# Patient Record
Sex: Female | Born: 1955 | Race: Black or African American | Hispanic: No | Smoking: Current every day smoker
Health system: Southern US, Community
[De-identification: ages and names within clinical notes are randomized; demographics above are authoritative.]

## PROBLEM LIST (undated history)

## (undated) ENCOUNTER — Emergency Department (HOSPITAL_COMMUNITY): Discharge status: Absent without leave | Payer: Medicaid Other

## (undated) DIAGNOSIS — M4800 Spinal stenosis, site unspecified: Secondary | ICD-10-CM

## (undated) DIAGNOSIS — IMO0001 Reserved for inherently not codable concepts without codable children: Secondary | ICD-10-CM

## (undated) DIAGNOSIS — J189 Pneumonia, unspecified organism: Secondary | ICD-10-CM

## (undated) DIAGNOSIS — I10 Essential (primary) hypertension: Secondary | ICD-10-CM

## (undated) DIAGNOSIS — R32 Unspecified urinary incontinence: Secondary | ICD-10-CM

## (undated) DIAGNOSIS — N183 Chronic kidney disease, stage 3 unspecified: Secondary | ICD-10-CM

## (undated) DIAGNOSIS — K5900 Constipation, unspecified: Secondary | ICD-10-CM

## (undated) DIAGNOSIS — G959 Disease of spinal cord, unspecified: Secondary | ICD-10-CM

## (undated) DIAGNOSIS — K219 Gastro-esophageal reflux disease without esophagitis: Secondary | ICD-10-CM

## (undated) DIAGNOSIS — J449 Chronic obstructive pulmonary disease, unspecified: Secondary | ICD-10-CM

## (undated) DIAGNOSIS — Y249XXA Unspecified firearm discharge, undetermined intent, initial encounter: Secondary | ICD-10-CM

## (undated) DIAGNOSIS — G9341 Metabolic encephalopathy: Secondary | ICD-10-CM

## (undated) DIAGNOSIS — Z87442 Personal history of urinary calculi: Secondary | ICD-10-CM

## (undated) DIAGNOSIS — F17200 Nicotine dependence, unspecified, uncomplicated: Secondary | ICD-10-CM

## (undated) DIAGNOSIS — W3400XA Accidental discharge from unspecified firearms or gun, initial encounter: Secondary | ICD-10-CM

## (undated) DIAGNOSIS — F1419 Cocaine abuse with unspecified cocaine-induced disorder: Secondary | ICD-10-CM

## (undated) DIAGNOSIS — G5600 Carpal tunnel syndrome, unspecified upper limb: Secondary | ICD-10-CM

## (undated) DIAGNOSIS — M069 Rheumatoid arthritis, unspecified: Secondary | ICD-10-CM

## (undated) HISTORY — PX: ABDOMINAL HYSTERECTOMY: SHX81

## (undated) HISTORY — PX: COLON SURGERY: SHX602

## (undated) HISTORY — PX: ABDOMINAL SURGERY: SHX537

---

## 1997-12-19 ENCOUNTER — Encounter: Payer: Self-pay | Admitting: Emergency Medicine

## 1997-12-19 ENCOUNTER — Emergency Department (HOSPITAL_COMMUNITY): Admission: EM | Admit: 1997-12-19 | Discharge: 1997-12-19 | Payer: Self-pay | Admitting: Emergency Medicine

## 1999-03-22 ENCOUNTER — Emergency Department (HOSPITAL_COMMUNITY): Admission: EM | Admit: 1999-03-22 | Discharge: 1999-03-23 | Payer: Self-pay | Admitting: *Deleted

## 1999-03-25 ENCOUNTER — Emergency Department (HOSPITAL_COMMUNITY): Admission: EM | Admit: 1999-03-25 | Discharge: 1999-03-25 | Payer: Self-pay | Admitting: Emergency Medicine

## 1999-03-26 ENCOUNTER — Encounter: Payer: Self-pay | Admitting: Emergency Medicine

## 1999-03-26 ENCOUNTER — Ambulatory Visit (HOSPITAL_COMMUNITY): Admission: RE | Admit: 1999-03-26 | Discharge: 1999-03-26 | Payer: Self-pay | Admitting: Emergency Medicine

## 1999-07-13 ENCOUNTER — Emergency Department (HOSPITAL_COMMUNITY): Admission: EM | Admit: 1999-07-13 | Discharge: 1999-07-13 | Payer: Self-pay | Admitting: *Deleted

## 1999-10-15 ENCOUNTER — Encounter: Payer: Self-pay | Admitting: Emergency Medicine

## 1999-10-15 ENCOUNTER — Emergency Department (HOSPITAL_COMMUNITY): Admission: EM | Admit: 1999-10-15 | Discharge: 1999-10-15 | Payer: Self-pay | Admitting: Emergency Medicine

## 1999-10-25 ENCOUNTER — Emergency Department (HOSPITAL_COMMUNITY): Admission: EM | Admit: 1999-10-25 | Discharge: 1999-10-25 | Payer: Self-pay | Admitting: Emergency Medicine

## 1999-10-27 ENCOUNTER — Ambulatory Visit (HOSPITAL_BASED_OUTPATIENT_CLINIC_OR_DEPARTMENT_OTHER): Admission: RE | Admit: 1999-10-27 | Discharge: 1999-10-27 | Payer: Self-pay | Admitting: Orthopedic Surgery

## 2000-01-20 ENCOUNTER — Emergency Department (HOSPITAL_COMMUNITY): Admission: EM | Admit: 2000-01-20 | Discharge: 2000-01-20 | Payer: Self-pay | Admitting: Emergency Medicine

## 2000-02-21 ENCOUNTER — Emergency Department (HOSPITAL_COMMUNITY): Admission: EM | Admit: 2000-02-21 | Discharge: 2000-02-21 | Payer: Self-pay | Admitting: Emergency Medicine

## 2000-08-16 ENCOUNTER — Inpatient Hospital Stay (HOSPITAL_COMMUNITY): Admission: AD | Admit: 2000-08-16 | Discharge: 2000-08-22 | Payer: Self-pay | Admitting: *Deleted

## 2000-08-16 ENCOUNTER — Encounter (INDEPENDENT_AMBULATORY_CARE_PROVIDER_SITE_OTHER): Payer: Self-pay

## 2000-08-18 ENCOUNTER — Encounter: Payer: Self-pay | Admitting: Obstetrics

## 2000-08-26 ENCOUNTER — Inpatient Hospital Stay (HOSPITAL_COMMUNITY): Admission: AD | Admit: 2000-08-26 | Discharge: 2000-08-26 | Payer: Self-pay | Admitting: Obstetrics

## 2000-09-02 ENCOUNTER — Inpatient Hospital Stay (HOSPITAL_COMMUNITY): Admission: EM | Admit: 2000-09-02 | Discharge: 2000-09-05 | Payer: Self-pay

## 2002-07-22 ENCOUNTER — Emergency Department (HOSPITAL_COMMUNITY): Admission: EM | Admit: 2002-07-22 | Discharge: 2002-07-22 | Payer: Self-pay | Admitting: Emergency Medicine

## 2002-10-14 ENCOUNTER — Emergency Department (HOSPITAL_COMMUNITY): Admission: EM | Admit: 2002-10-14 | Discharge: 2002-10-14 | Payer: Self-pay | Admitting: Emergency Medicine

## 2003-02-15 ENCOUNTER — Emergency Department (HOSPITAL_COMMUNITY): Admission: EM | Admit: 2003-02-15 | Discharge: 2003-02-15 | Payer: Self-pay | Admitting: Emergency Medicine

## 2003-03-14 ENCOUNTER — Inpatient Hospital Stay (HOSPITAL_COMMUNITY): Admission: EM | Admit: 2003-03-14 | Discharge: 2003-03-16 | Payer: Self-pay | Admitting: Emergency Medicine

## 2004-02-05 ENCOUNTER — Inpatient Hospital Stay (HOSPITAL_COMMUNITY): Admission: EM | Admit: 2004-02-05 | Discharge: 2004-02-14 | Payer: Self-pay | Admitting: Emergency Medicine

## 2004-08-07 ENCOUNTER — Emergency Department (HOSPITAL_COMMUNITY): Admission: EM | Admit: 2004-08-07 | Discharge: 2004-08-07 | Payer: Self-pay | Admitting: Emergency Medicine
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
## 2004-09-25 IMAGING — CR DG CHEST 2V
2 series · 2 of 2 positions shown · non-contrast
Comparison: none

CLINICAL DATA: chest pain; kidney stones
 TWO VIEW CHEST 03/16/03
 There are no prior studies available for comparison.  There are atelectatic/infiltrative changes seen within the right lower lobe.  Heart is upper normal in size.  There are no mediastinal abnormalities.  
 IMPRESSION
 Patchy atelectatic/infiltrative changes right lower lobe.

[view not recorded (1 of 2)]
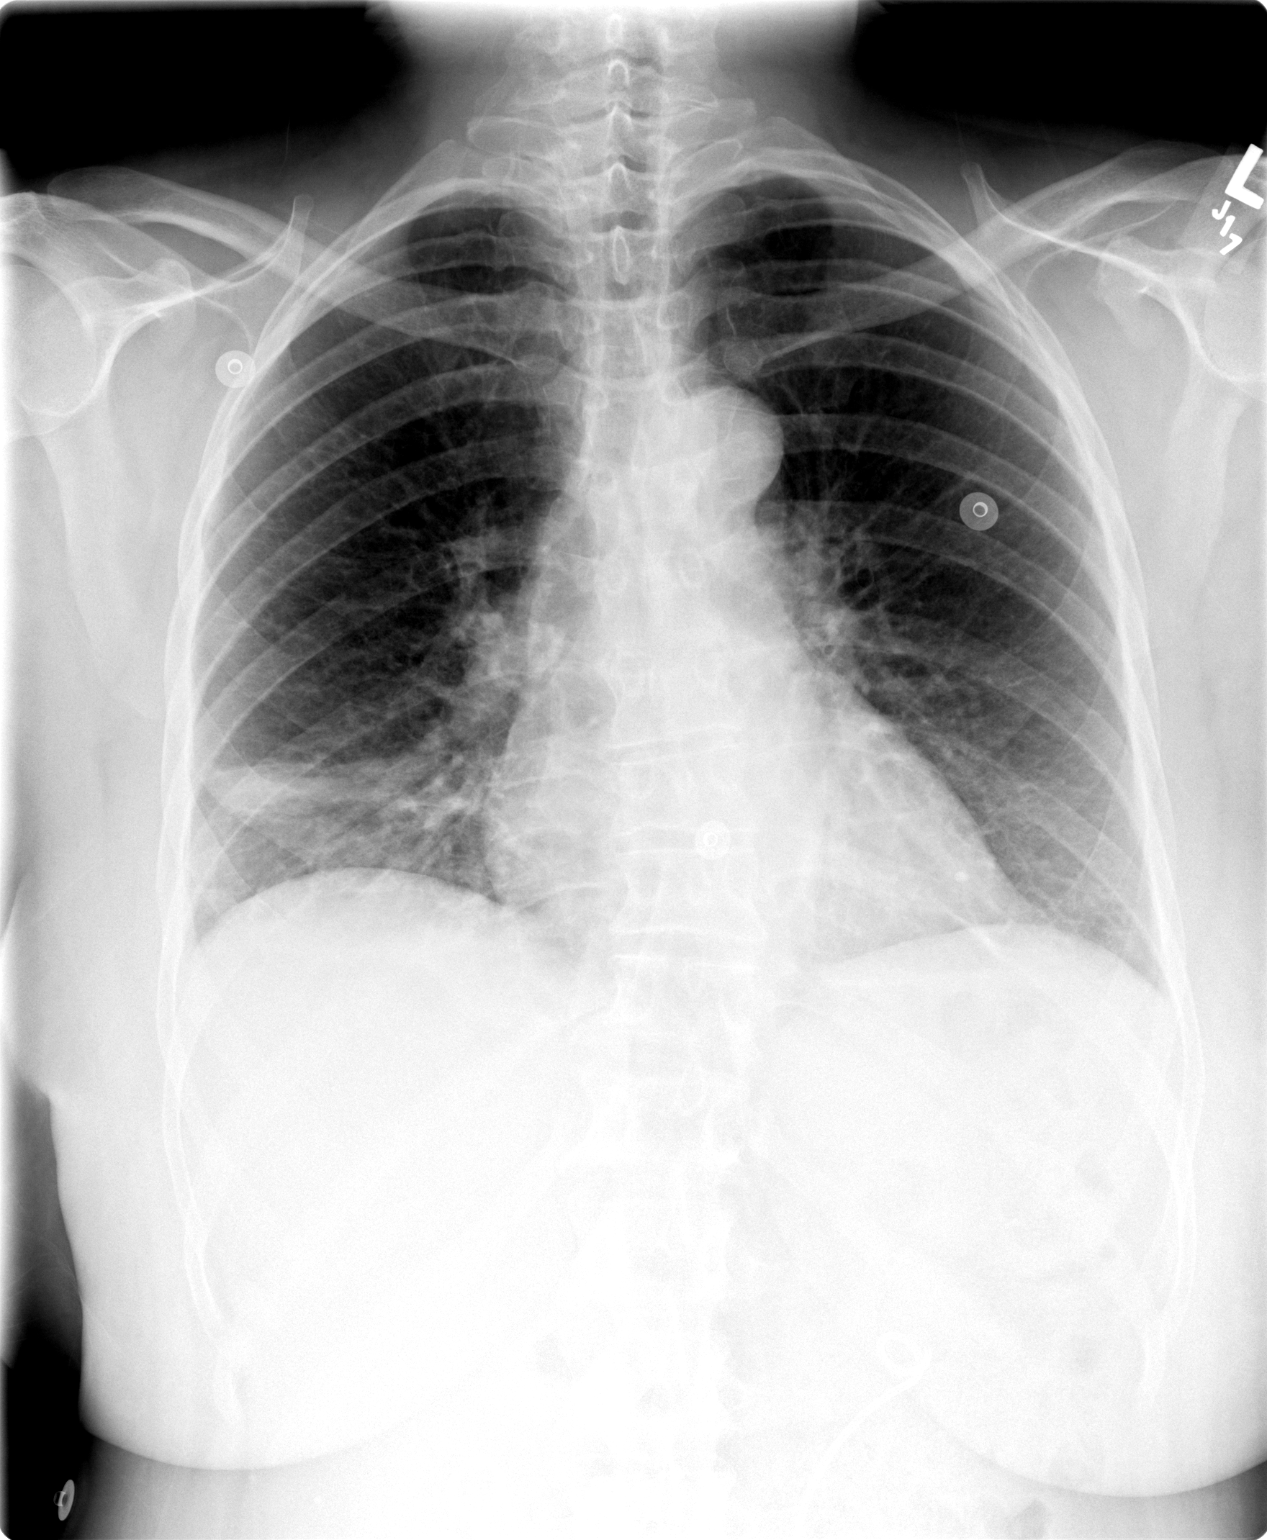

[view not recorded (2 of 2)]
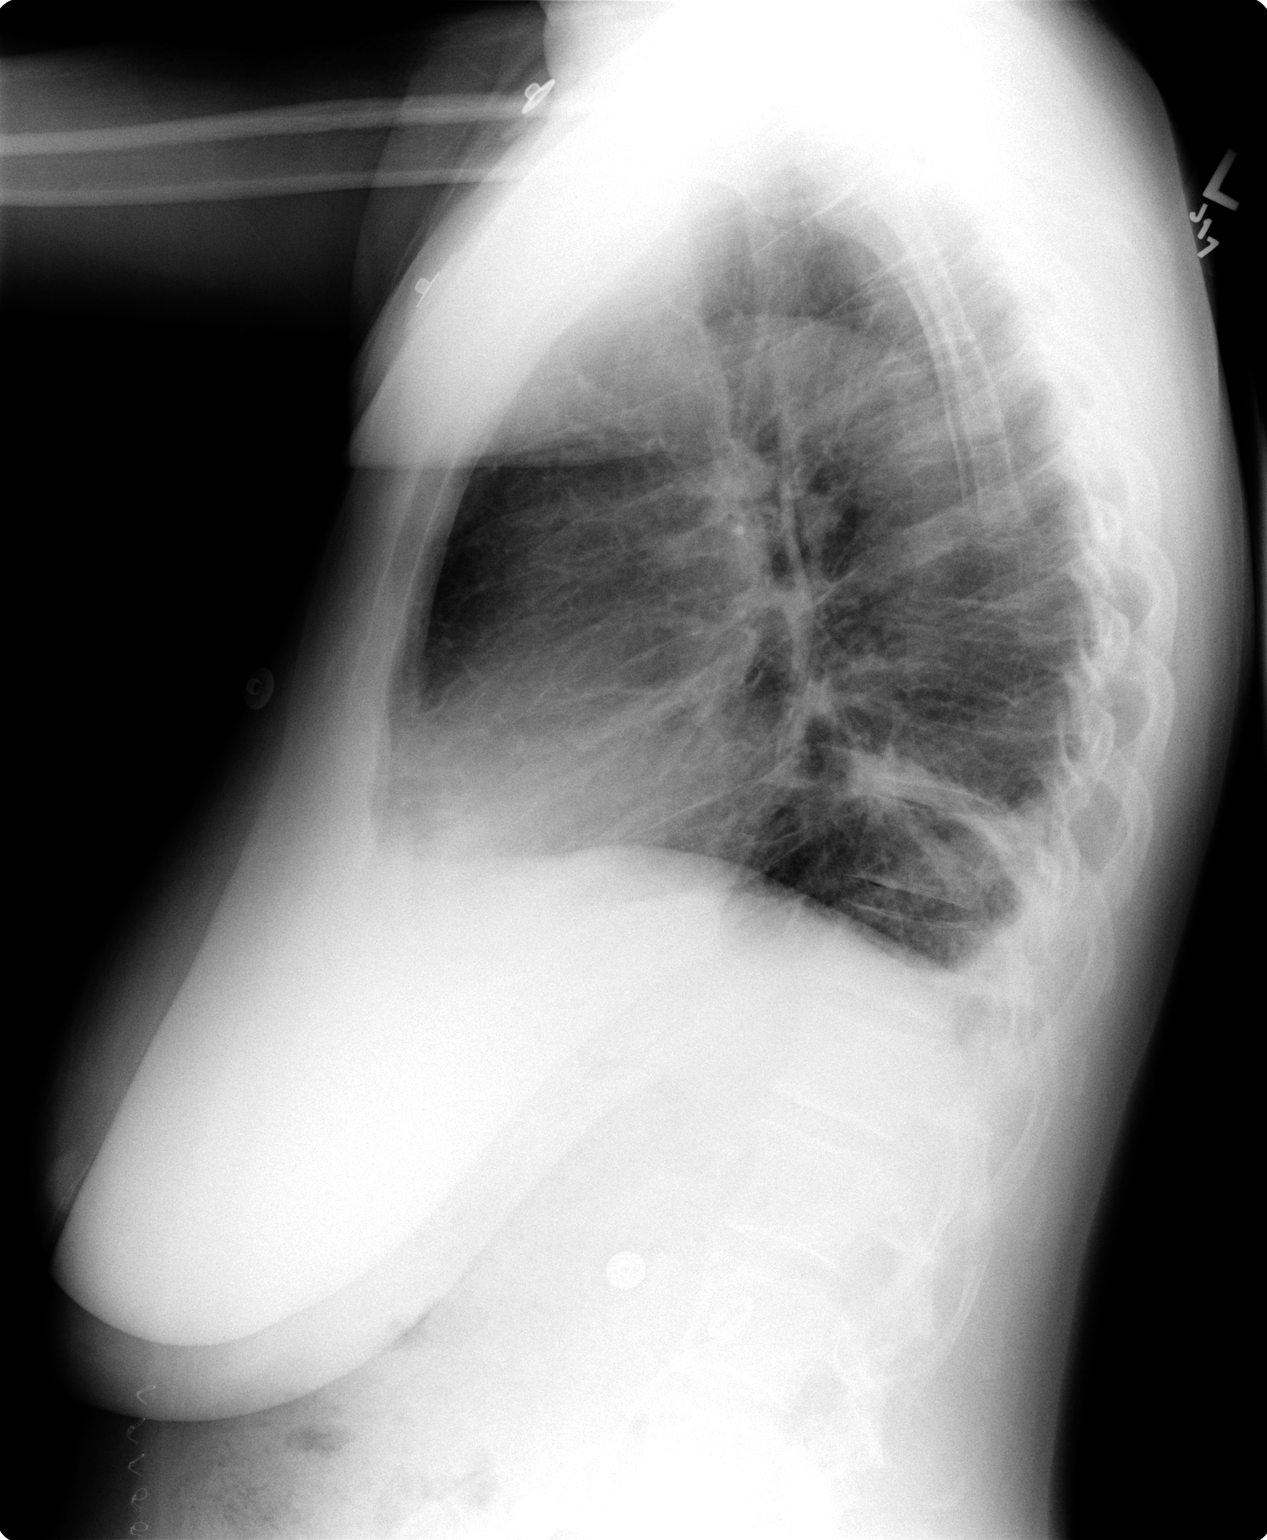

[2 of 2 positions shown; findings below may reference images not displayed]

## 2005-04-04 ENCOUNTER — Emergency Department (HOSPITAL_COMMUNITY): Admission: EM | Admit: 2005-04-04 | Discharge: 2005-04-04 | Payer: Self-pay | Admitting: Emergency Medicine

## 2005-04-25 ENCOUNTER — Emergency Department (HOSPITAL_COMMUNITY): Admission: EM | Admit: 2005-04-25 | Discharge: 2005-04-25 | Payer: Self-pay | Admitting: Emergency Medicine

## 2005-07-28 ENCOUNTER — Emergency Department (HOSPITAL_COMMUNITY): Admission: EM | Admit: 2005-07-28 | Discharge: 2005-07-29 | Payer: Self-pay | Admitting: Emergency Medicine
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
## 2005-08-19 IMAGING — CR DG ABDOMEN 2V
4 series · 4 of 4 positions shown · non-contrast
Comparison: 02/06/2004

ABDOMEN - 2 VIEW

CLINICAL DATA: Small bowel obstruction

[view not recorded (1 of 4)]
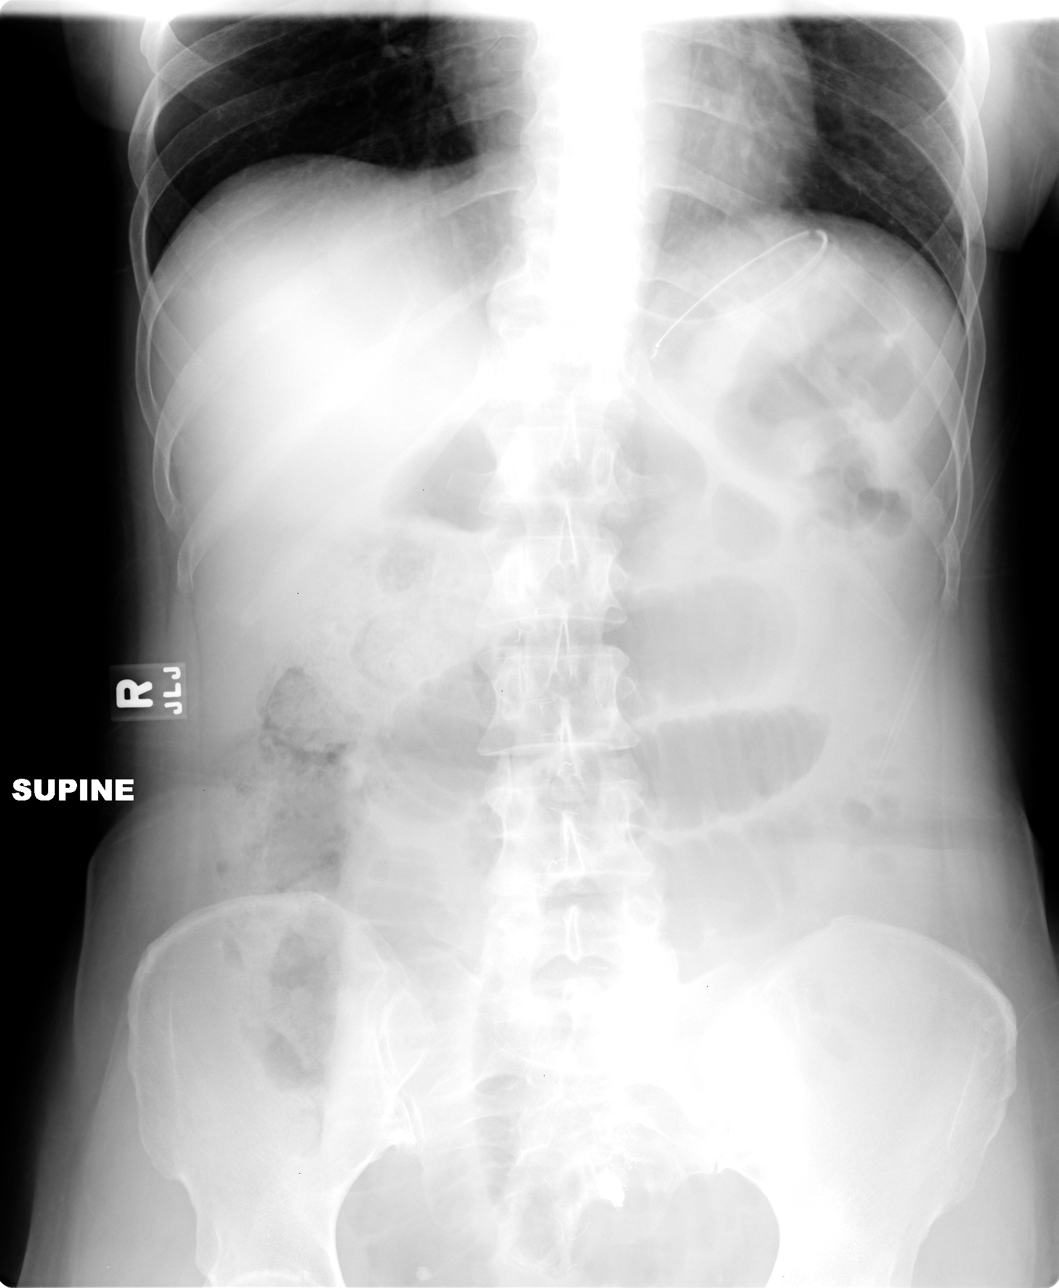

[view not recorded (2 of 4)]
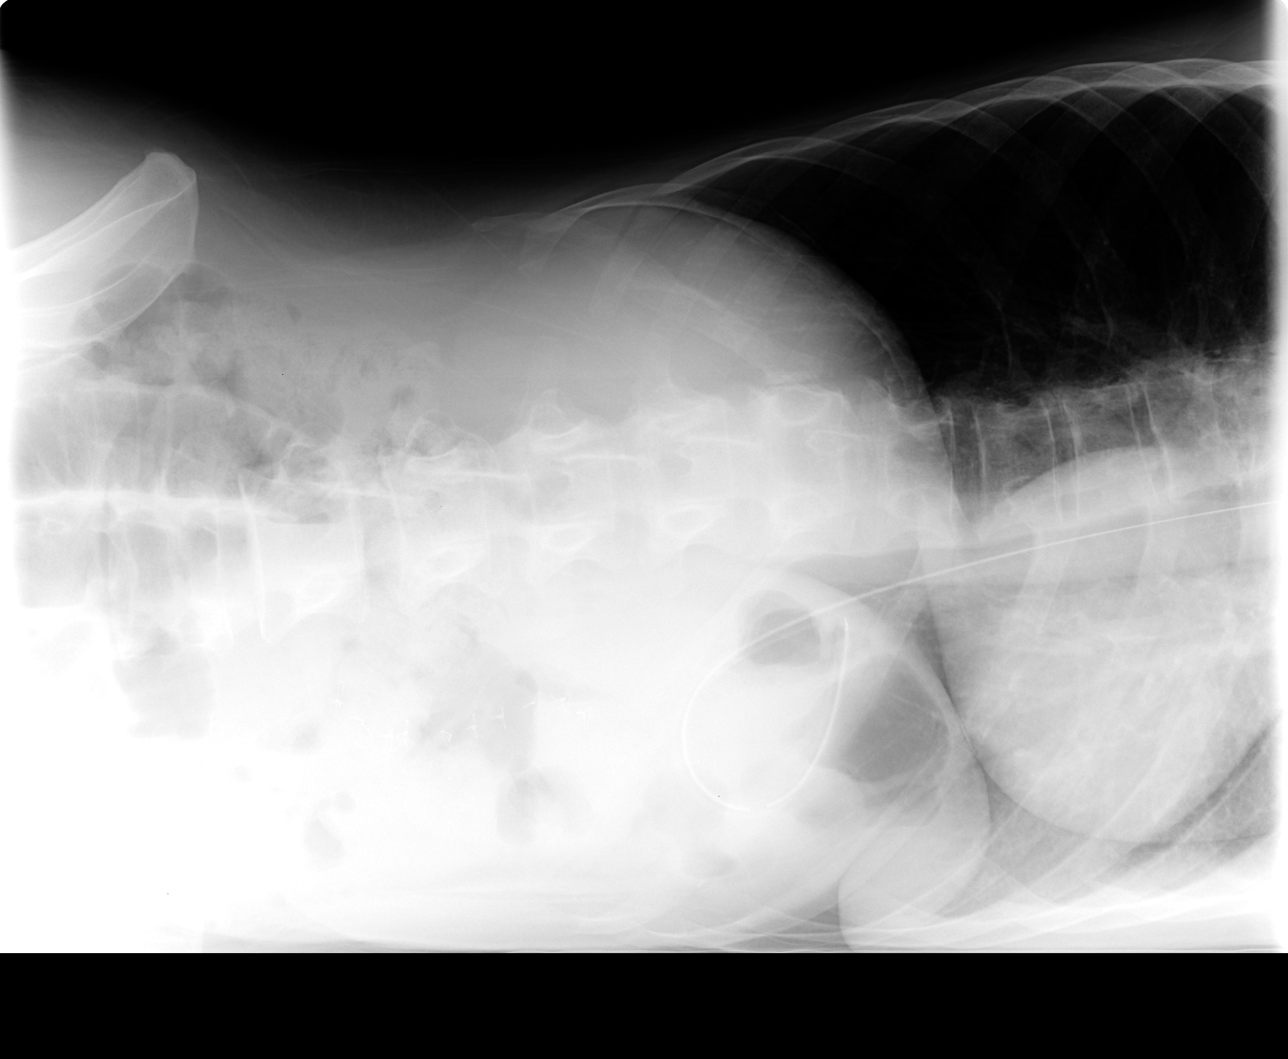

[view not recorded (3 of 4)]
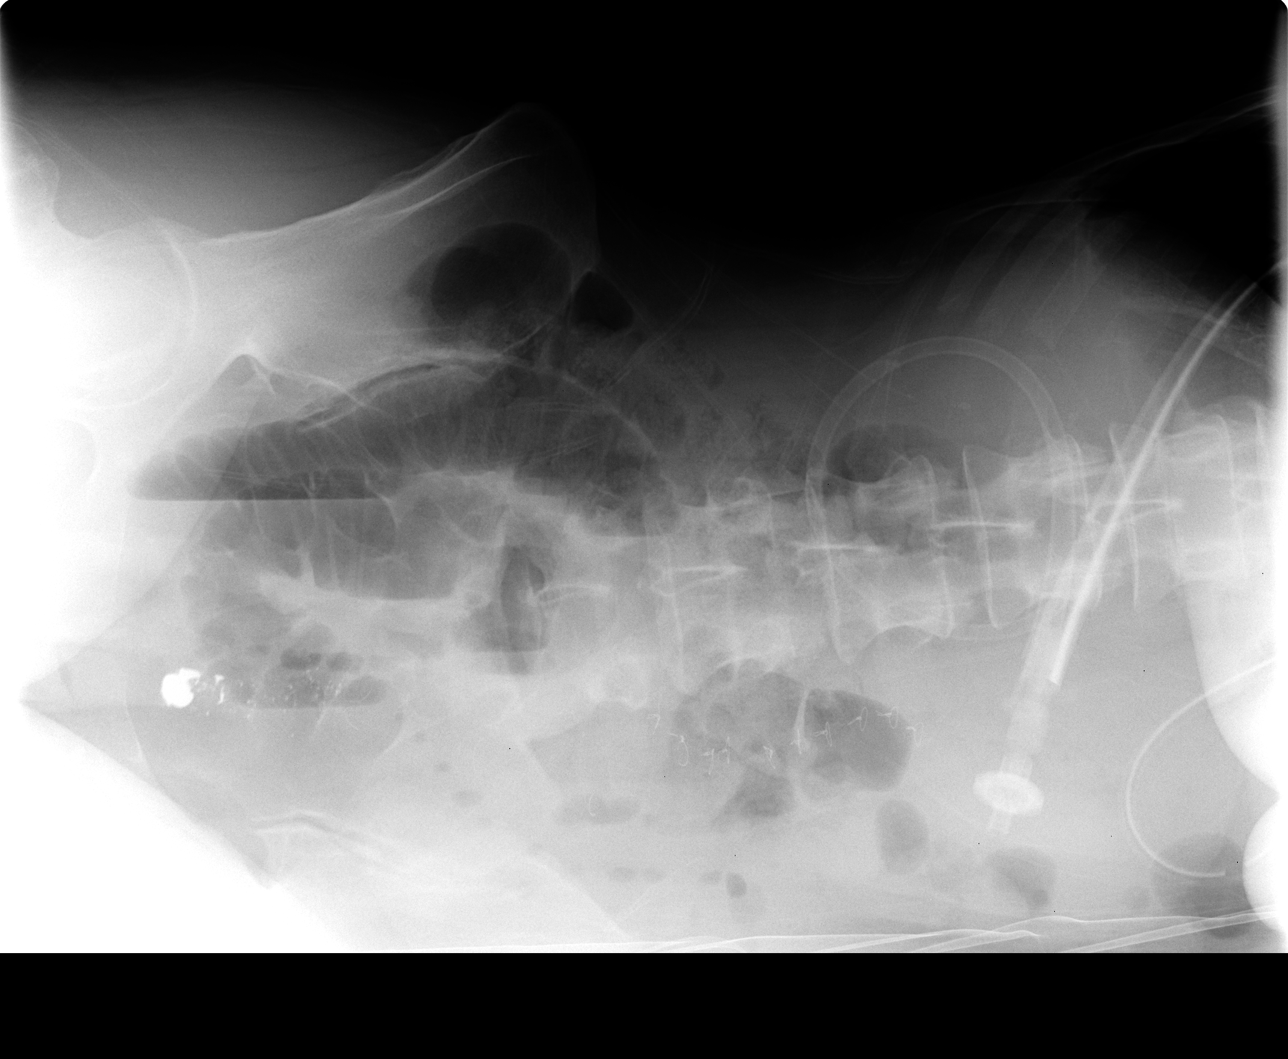

[view not recorded (4 of 4)]
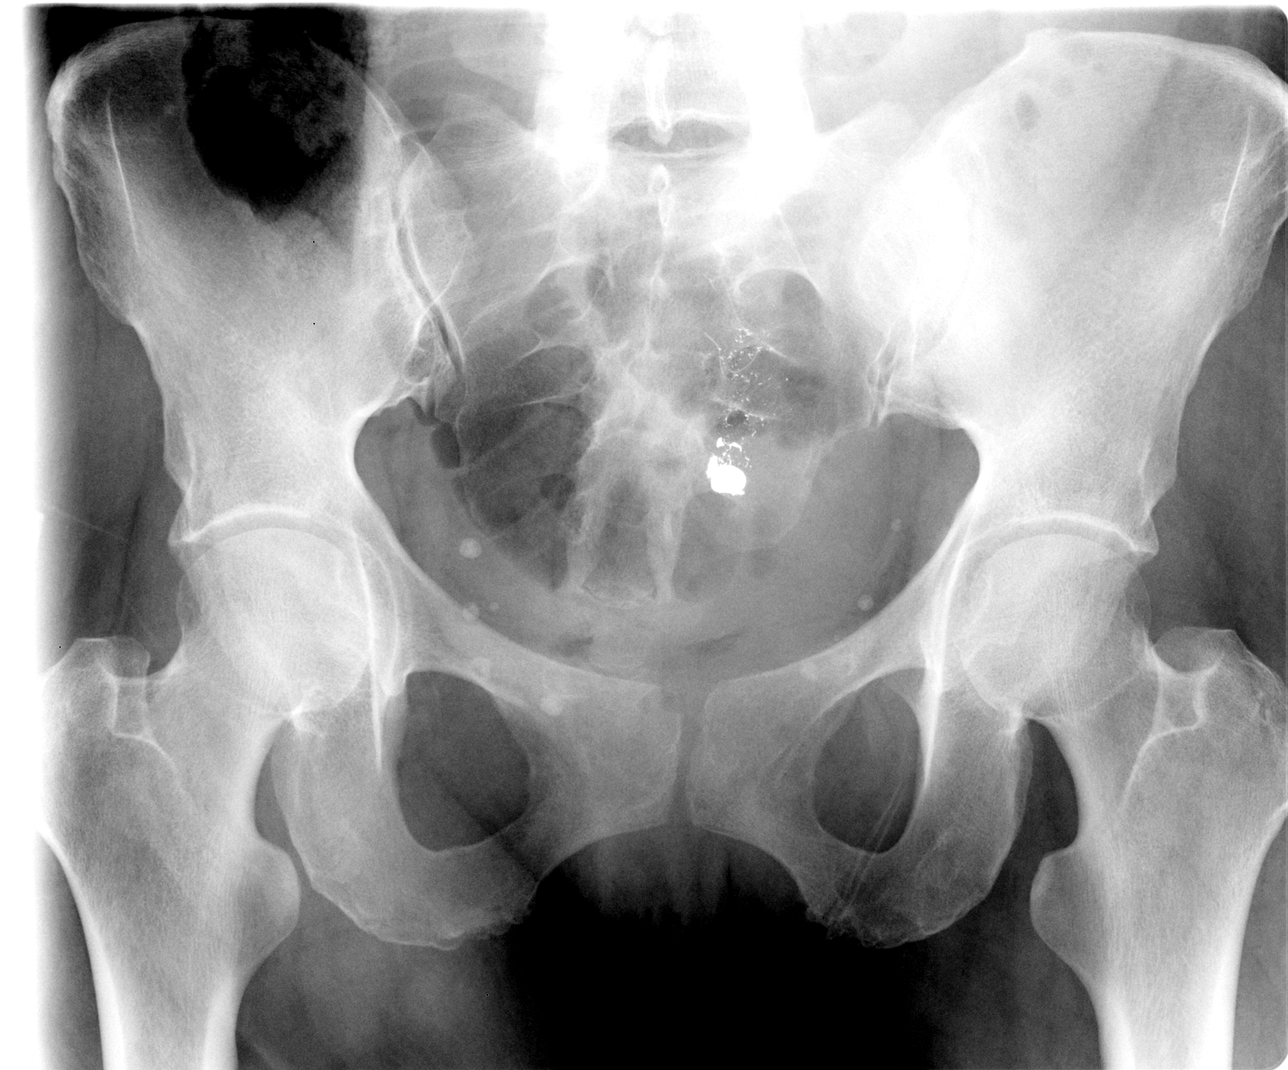

[4 of 4 positions shown; findings below may reference images not displayed]

FINDINGS: 2 view exam the abdomen shows no interval change in the dilated
central small bowel loops measuring up to 3.8 cm in diameter. Colonic gas has
decreased in the interval. No intraperitoneal free air. NG tube is coiled in the
stomach.

IMPRESSION

No substantial interval change in the central small bowel dilation worrisome for
obstruction.

## 2006-02-17 ENCOUNTER — Ambulatory Visit: Payer: Self-pay | Admitting: Internal Medicine

## 2006-02-17 ENCOUNTER — Emergency Department (HOSPITAL_COMMUNITY): Admission: EM | Admit: 2006-02-17 | Discharge: 2006-02-17 | Payer: Self-pay | Admitting: Emergency Medicine

## 2006-02-24 ENCOUNTER — Ambulatory Visit: Payer: Self-pay | Admitting: Internal Medicine

## 2006-03-03 ENCOUNTER — Ambulatory Visit: Payer: Self-pay | Admitting: Internal Medicine

## 2006-05-04 ENCOUNTER — Ambulatory Visit: Payer: Self-pay | Admitting: Internal Medicine
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
## 2007-06-05 ENCOUNTER — Emergency Department (HOSPITAL_COMMUNITY): Admission: EM | Admit: 2007-06-05 | Discharge: 2007-06-05 | Payer: Self-pay | Admitting: Family Medicine

## 2007-06-25 ENCOUNTER — Emergency Department (HOSPITAL_COMMUNITY): Admission: EM | Admit: 2007-06-25 | Discharge: 2007-06-25 | Payer: Self-pay | Admitting: Family Medicine

## 2007-06-30 ENCOUNTER — Ambulatory Visit: Payer: Self-pay | Admitting: Internal Medicine

## 2007-06-30 LAB — CONVERTED CEMR LAB
Benzodiazepines.: NEGATIVE
CO2: 22 meq/L (ref 19–32)
Calcium: 8.9 mg/dL (ref 8.4–10.5)
Chlamydia, Swab/Urine, PCR: NEGATIVE
Creatinine, Ser: 1.05 mg/dL (ref 0.40–1.20)
Creatinine,U: 109.8 mg/dL
Marijuana Metabolite: POSITIVE — AB
Methadone: NEGATIVE
Opiate Screen, Urine: NEGATIVE
Propoxyphene: NEGATIVE
Sed Rate: 66 mm/hr — ABNORMAL HIGH (ref 0–22)

## 2007-09-14 ENCOUNTER — Ambulatory Visit: Payer: Self-pay | Admitting: Internal Medicine

## 2007-10-26 ENCOUNTER — Ambulatory Visit: Payer: Self-pay | Admitting: Internal Medicine

## 2007-11-03 ENCOUNTER — Inpatient Hospital Stay (HOSPITAL_COMMUNITY): Admission: EM | Admit: 2007-11-03 | Discharge: 2007-11-06 | Payer: Self-pay | Admitting: Emergency Medicine

## 2008-08-02 ENCOUNTER — Emergency Department (HOSPITAL_COMMUNITY): Admission: EM | Admit: 2008-08-02 | Discharge: 2008-08-02 | Payer: Self-pay | Admitting: Emergency Medicine

## 2009-05-14 IMAGING — CR DG CHEST 2V
2 series · 2 of 2 positions shown · non-contrast
Comparison: 03/16/2003

CLINICAL DATA: Abdominal pain, fever, cough, body aches

CHEST - 2 VIEW

[w chest pa]
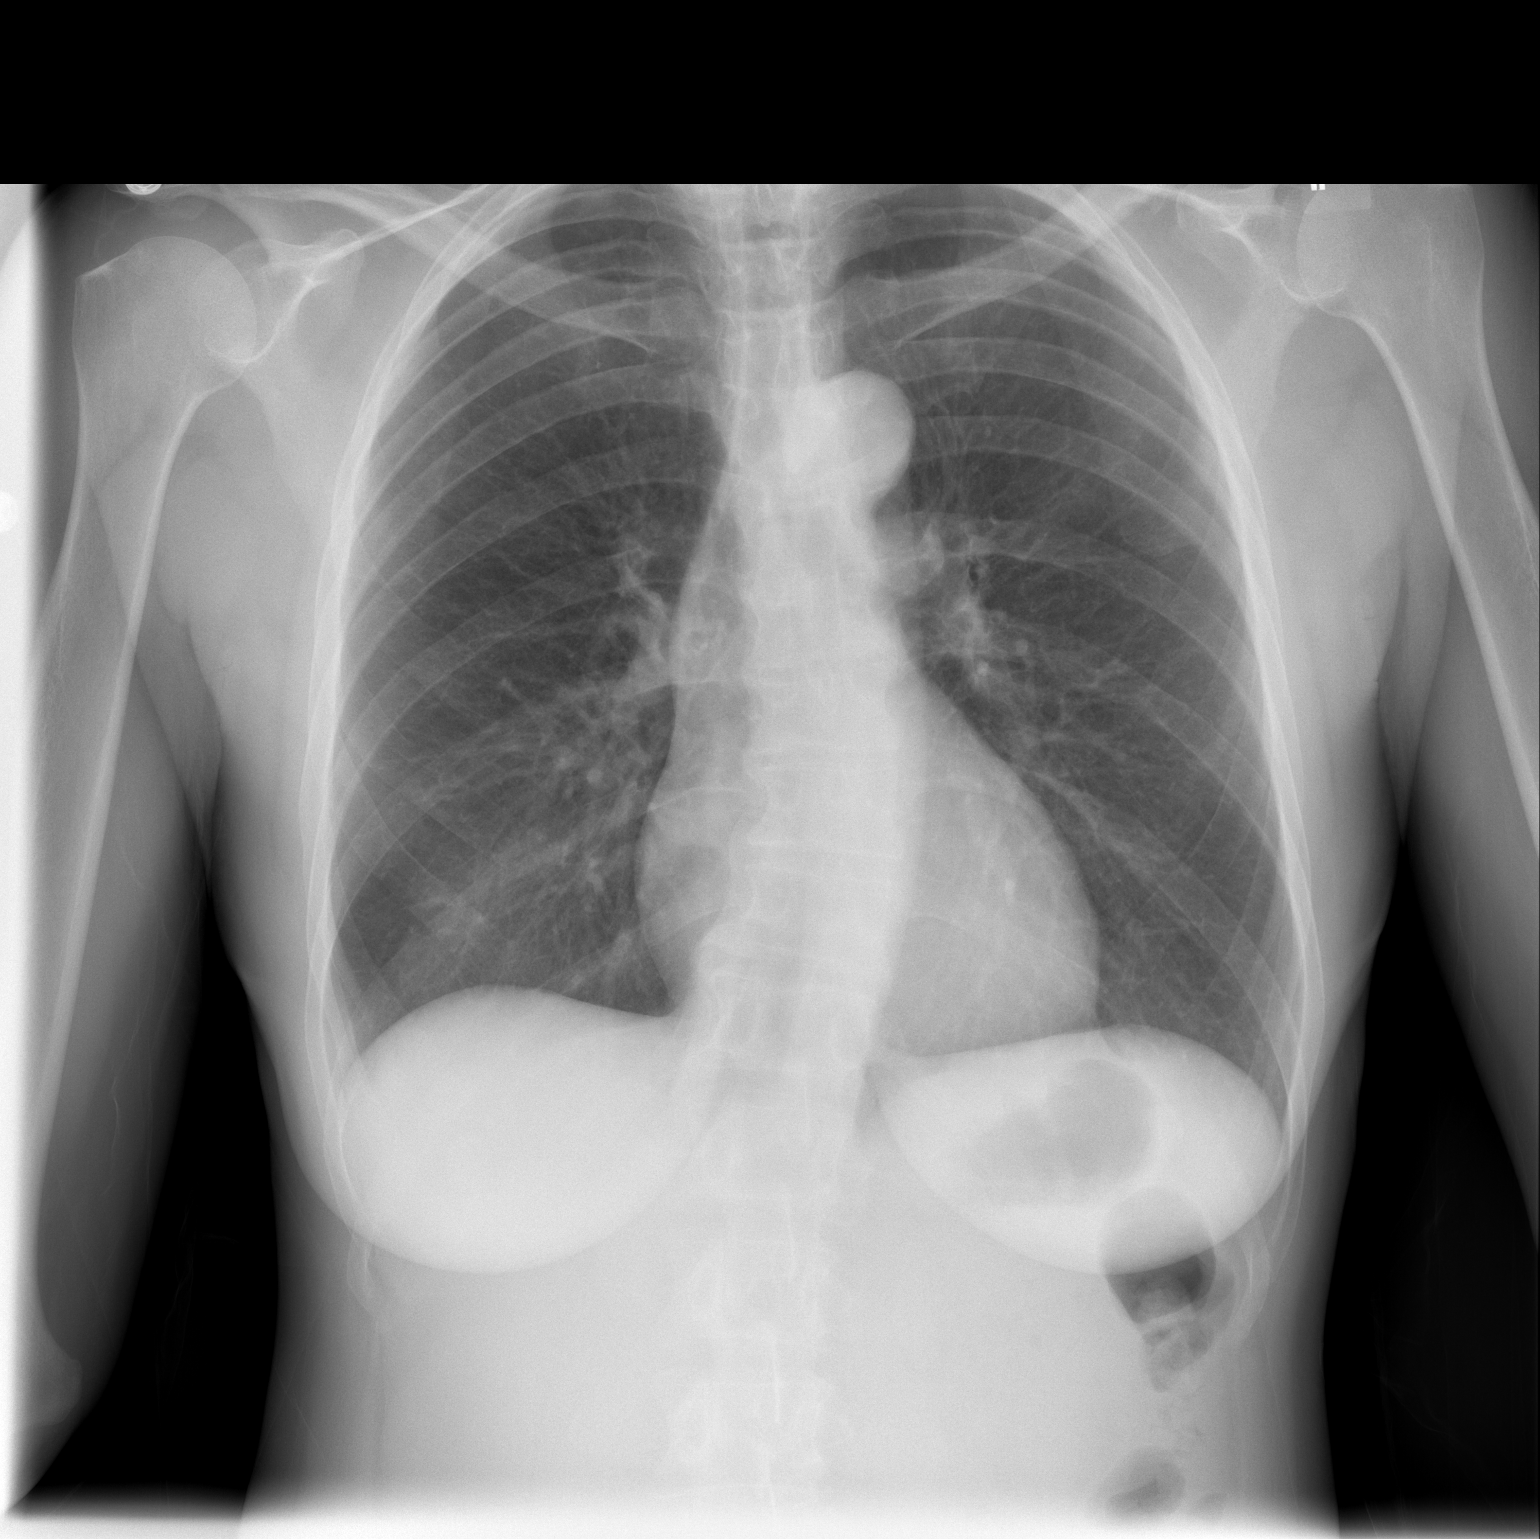

[w chest lat]
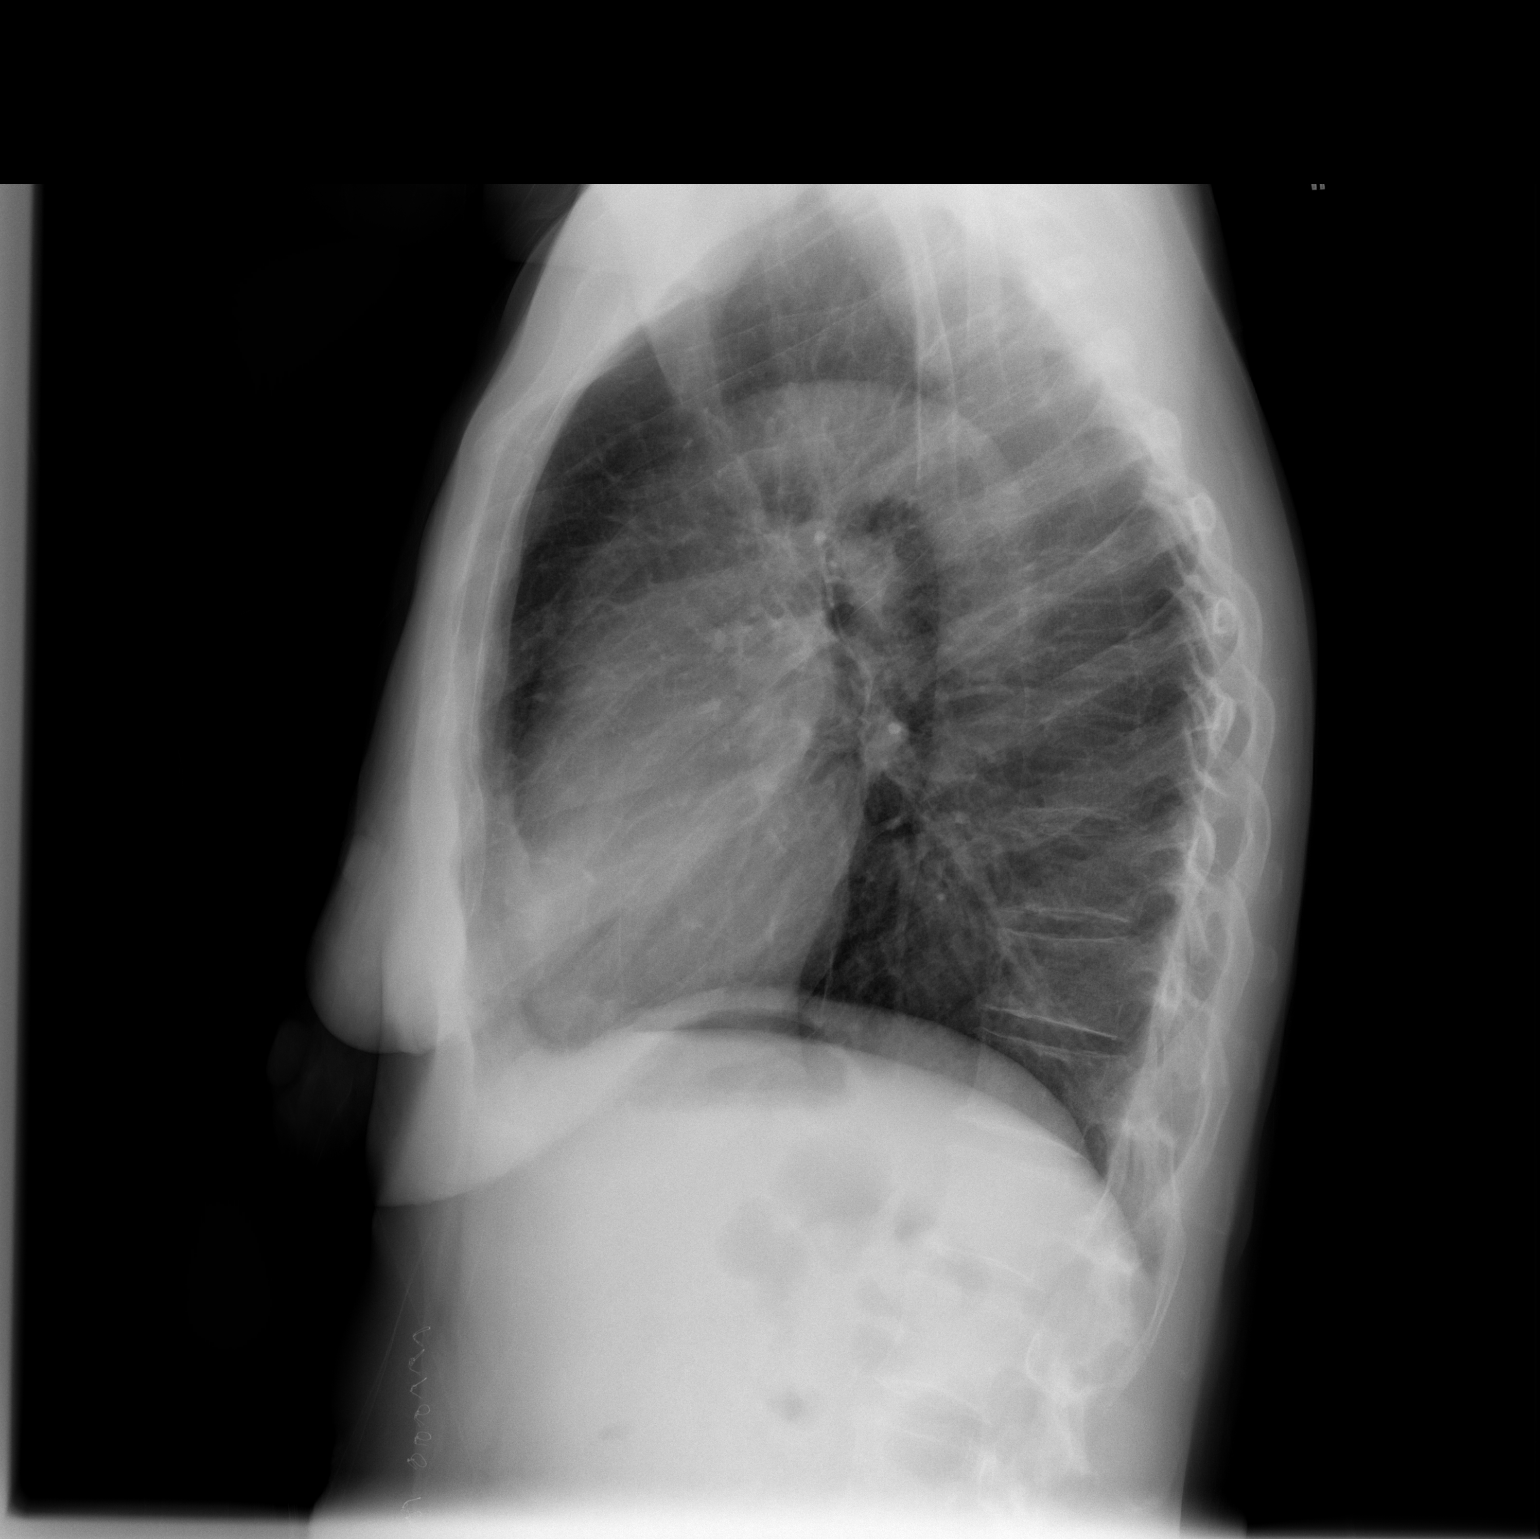

[2 of 2 positions shown; findings below may reference images not displayed]

FINDINGS: Broad-based levoconvex thoracic scoliosis.
Normal heart size, mediastinal contours, and pulmonary vascularity.
Hyperexpanded lungs without infiltrate or effusion.
Mild bony demineralization.
IMPRESSION: Suspect COPD.
Scoliosis.
No definite acute process.
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
## 2009-10-04 ENCOUNTER — Emergency Department (HOSPITAL_COMMUNITY): Admission: EM | Admit: 2009-10-04 | Discharge: 2009-10-04 | Payer: Self-pay | Admitting: Emergency Medicine

## 2010-02-01 ENCOUNTER — Encounter: Payer: Self-pay | Admitting: Internal Medicine

## 2010-03-26 LAB — POCT URINALYSIS DIPSTICK
Bilirubin Urine: NEGATIVE
Nitrite: NEGATIVE
Protein, ur: NEGATIVE mg/dL
pH: 5.5 (ref 5.0–8.0)

## 2010-04-15 ENCOUNTER — Emergency Department (HOSPITAL_COMMUNITY): Payer: Medicaid Other

## 2010-04-15 ENCOUNTER — Emergency Department (HOSPITAL_COMMUNITY)
Admission: EM | Admit: 2010-04-15 | Discharge: 2010-04-15 | Disposition: A | Discharge status: Home / Self Care | Payer: Medicaid Other | Attending: Emergency Medicine | Admitting: Emergency Medicine

## 2010-04-15 ENCOUNTER — Encounter (HOSPITAL_COMMUNITY): Payer: Self-pay | Admitting: Radiology

## 2010-04-15 DIAGNOSIS — M069 Rheumatoid arthritis, unspecified: Secondary | ICD-10-CM | POA: Insufficient documentation

## 2010-04-15 DIAGNOSIS — H5710 Ocular pain, unspecified eye: Secondary | ICD-10-CM | POA: Insufficient documentation

## 2010-04-15 DIAGNOSIS — Y9289 Other specified places as the place of occurrence of the external cause: Secondary | ICD-10-CM | POA: Insufficient documentation

## 2010-04-15 DIAGNOSIS — S62339A Displaced fracture of neck of unspecified metacarpal bone, initial encounter for closed fracture: Secondary | ICD-10-CM | POA: Insufficient documentation

## 2010-04-15 DIAGNOSIS — M79609 Pain in unspecified limb: Secondary | ICD-10-CM | POA: Insufficient documentation

## 2010-04-15 DIAGNOSIS — R6884 Jaw pain: Secondary | ICD-10-CM | POA: Insufficient documentation

## 2010-04-15 DIAGNOSIS — S0230XA Fracture of orbital floor, unspecified side, initial encounter for closed fracture: Secondary | ICD-10-CM | POA: Insufficient documentation

## 2010-04-15 DIAGNOSIS — R51 Headache: Secondary | ICD-10-CM | POA: Insufficient documentation

## 2010-04-15 DIAGNOSIS — R404 Transient alteration of awareness: Secondary | ICD-10-CM | POA: Insufficient documentation

## 2010-04-15 DIAGNOSIS — S0010XA Contusion of unspecified eyelid and periocular area, initial encounter: Secondary | ICD-10-CM | POA: Insufficient documentation

## 2010-04-15 HISTORY — DX: Nicotine dependence, unspecified, uncomplicated: F17.200

## 2010-05-02 ENCOUNTER — Emergency Department (HOSPITAL_COMMUNITY)
Admission: EM | Admit: 2010-05-02 | Discharge: 2010-05-02 | Disposition: A | Discharge status: Home / Self Care | Payer: Medicaid Other | Attending: Emergency Medicine | Admitting: Emergency Medicine

## 2010-05-02 DIAGNOSIS — M79609 Pain in unspecified limb: Secondary | ICD-10-CM | POA: Insufficient documentation

## 2010-05-02 DIAGNOSIS — M069 Rheumatoid arthritis, unspecified: Secondary | ICD-10-CM | POA: Insufficient documentation

## 2010-05-02 DIAGNOSIS — S0280XA Fracture of other specified skull and facial bones, unspecified side, initial encounter for closed fracture: Secondary | ICD-10-CM | POA: Insufficient documentation

## 2010-05-02 DIAGNOSIS — X58XXXA Exposure to other specified factors, initial encounter: Secondary | ICD-10-CM | POA: Insufficient documentation

## 2010-05-02 DIAGNOSIS — S62309A Unspecified fracture of unspecified metacarpal bone, initial encounter for closed fracture: Secondary | ICD-10-CM | POA: Insufficient documentation

## 2010-05-02 DIAGNOSIS — Y929 Unspecified place or not applicable: Secondary | ICD-10-CM | POA: Insufficient documentation
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
## 2010-05-26 NOTE — Discharge Summary (Signed)
[name], SEARING              ACCOUNT NO.:  [id number]   MEDICAL RECORD NO.:  [id number]          PATIENT TYPE:  INP   LOCATION:  6710                         FACILITY:  [facility]   PHYSICIAN:  Herbie Saxon, MDDATE OF BIRTH:  [birth year]   DATE OF ADMISSION:  11/02/2007  DATE OF DISCHARGE:  11/06/2007                               DISCHARGE SUMMARY   DISCHARGE DIAGNOSES:  1. Colitis resolving.  2. Flu-like illness improved on Tamiflu.  3. Hypertension stable.  4. Asymptomatic bradycardia improved.  5. Chronic obstructive pulmonary disease stable.  6. History of cocaine and tobacco abuse.  7. Mild anemia normocytic normochromic.   RADIOLOGY:  CT abdomen and pelvis obtained on November 02, 2007, showed  that there is mild abnormality of the colon especially with the  transverse colon consistent with colitis.   HOSPITAL COURSE:  This 55 year old African American lady presented to  the emergency room with abdominal pain of 1-day duration and preceding 2-  week history of flu-like symptoms.  The patient also had diarrhea at  presentation.  Her CT abdomen was consistent with colitis.  The patient  was also started on Tamiflu for empiric treatment of H1N1 influenza.  He  was also started on IV Cipro and Flagyl.  Her steroids are continued  p.o. by the second day of admission.  Hemoccult was negative.  Blood  pressure was optimized initially with using Norvasc and  hydrochlorothiazide, but when she went into the asymptomatic  bradycardia, the Norvasc was discontinued and hydralazine was used to  replace it.  Bradycardia has resolved.  The blood pressures also  optimally controlled to 116/73.  Abdominal pain is much more improved.  There is no more diarrhea.  No nausea or vomiting.  The patient has been  counseled on substance abuse cessation.  She promises to comply with  outpatient detox.   DISCHARGE CONDITION:  Stable.   DIET:  Heart-healthy.   ACTIVITY:  To increase  slowly as tolerated.   FOLLOWUP:  With Dr. Reche Dixon in the next 1 week.  Dr. Reche Dixon will arrange  outpatient GI consult for colonoscopy at a later date once the  inflammation resolves.   MEDICATIONS ON DISCHARGE:  1. Cipro 500 mg twice daily for next 1 week.  2. Flagyl 500 mg t.i.d. for the next 1 week.  3. Hydrochlorothiazide 25 mg daily.  4. Hydralazine 50 mg b.i.d.  5. Tamiflu 75 mg b.i.d. for 1 more day.  6. Prednisone 5 mg daily.  7. Prilosec 20 mg daily.  8. Percocet 5/325 mg 1 q.6 hour p.r.n.   PHYSICAL EXAMINATION:  GENERAL:  On examination, she is a elderly lady,  not in acute distress.  VITAL SIGNS:  Temperature is 98, pulse 65, respiratory rate is 20, and  blood pressure 116/73.  HEENT:  Pupils equal reacting to light and accommodation.  Head is  atraumatic and normocephalic.  Oropharynx and nasopharynx are clear.  NECK:  Supple.  No elevated JVD, thyromegaly, or adenopathy.  CHEST:  Clinically clear.  CARDIAC:  Heart sounds 1 and 2.  Regular rate and rhythm.  ABDOMEN: Soft, nontender.  No organomegaly.  Bowel sounds present.  NEUROLOGIC:  She is alert and oriented in time, place, and person.  Peripheral pulses present.  No pedal edema.  CNS:  No gross neurological deficits.  No calf tenderness.   LABORATORY DATA:  Hematocrit is 37, WBC 4.9 and platelet count 282.  Chemistry, sodium 138, potassium 4.0, chloride 107, bicarbonate 21,  glucose 86, BUN 6, creatinine 0.9 and lipase was 19.  It is also noted  that the patient is to at least have voluntary detox in the next 1-2  weeks at the [facility].   The patient's illness, medications, treatment plan explained to her and  she verbalizes understanding and need to follow up with clinic  appointment.      Herbie Saxon, MD  Electronically Signed     MIO/MEDQ  D:  11/06/2007  T:  11/07/2007  Job:  [id number]
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
## 2010-05-29 NOTE — Discharge Summary (Signed)
[name], [name]                          ACCOUNT NO.:  [id number]   MEDICAL RECORD NO.:  [id number]                   PATIENT TYPE:  INP   LOCATION:  0368                                 FACILITY:  [facility]   PHYSICIAN:  Lindaann Slough, M.D.               DATE OF BIRTH:  1955/04/19   DATE OF ADMISSION:  03/14/2003  DATE OF DISCHARGE:  03/16/2003                                 DISCHARGE SUMMARY   ADDENDUM:  Ms. Borys had stone extraction on March 15, 2003, and she was discharged  home on March 15, 2003. However, an hour after discharge she started  complaining of chest pain. An EKG showed nonspecific T-wave abnormalities. A  consultation with [facility] was obtained. Cardiac enzymes were  requested as well as urine drug screen. All enzymes were negative.  Lipid  profile showed elevated cholesterol and urine was positive for cocaine. The  patient did not have any more chest pain. She was re-evaluated on March 16, 2003, by Dr. Delaney Meigs and he felt that the patient was stable for discharge.  She was then discharged home on March 16, 2003, on Darvocet 100 one tablet  q.4h. p.r.n. pain and Cipro 250 mg b.i.d.   CONDITION ON DISCHARGE:  Improved.   DISCHARGE DIET:  Regular.   The patient will be followed in my office next week for removal of double-J  catheter.  She was also strongly advised to stop using cocaine and to have a  follow up for mild lipid abnormality.  Of note, the patient did not have any  more chest pain.                                               Lindaann Slough, M.D.    MN/MEDQ  D:  03/16/2003  T:  03/16/2003  Job:  [id number]
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
## 2010-05-29 NOTE — Op Note (Signed)
[facility]  Patient:    [name], [name] Visit Number: [id number] MRN: [id number]          Service Type: MED Location: [phone] Attending Physician:  Altamese Edinboro. Dictated by:   Roseanna Rainbow, M.D. Proc. 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  POSTOPERATIVE DIAGNOSES:      1. Myomatous uterus with vaginal hemorrhage                                  refractory to medical management.                               2. Uterine fibroid prolapsing through the                                  cervix.  PROCEDURE:                    Total abdominal hysterectomy.  SURGEON:                      Roseanna Rainbow, M.D.  ASSISTANT:                    Bing Neighbors. Clearance Coots, M.D.  ANESTHESIA:                   General endotracheal.  COMPLICATIONS:                None.  ESTIMATED BLOOD LOSS:         150 cc.  IV FLUIDS AND URINE OUTPUT:   As per anesthesiology.  INDICATIONS:                  The patient is a 55 year old with known myomatous uterus without presented with vaginal hemorrhage.  The bleeding failed to respond to high dose intravenous Premarin.  FINDINGS:                     Diffusely enlarged uterus.  At the time the uterine fundus was amputated, a myoma was noted in the lower uterine segment. This was approximately 3-4 cm in diameter.  The ovaries appeared small and normal.  DESCRIPTION OF PROCEDURE:     The patient was taken to the operating room with an IV running.  She was placed in the dorsal lithotomy position and given general anesthesia.  She was prepped and draped in the usual sterile fashion. A midline incision was made through the previous scar with a scalpel.  This was carried down to the underlying layer of fascia.   The fascia was  incised along its length.  The rectus muscles were separated.  The parietal peritoneum was tented up and entered sharply.  This incision was then extended superiorly and inferiorly with good visualization of the bladder.  At this point, the bowel was packed away with moistened laparotomy sponges.  Deavers were used for retraction.  Long Kelly clamps were placed  on the uterine cornua.  The round ligaments on both sides were then divided with bipolar cautery.  The broad ligament was divided anteriorly to the vesicouterine peritoneum anteriorly from both sides.  The bladder was dissected off.  The utero-ovarian ligaments and fallopian tubes were then doubly clamped with Heaney clamps, transected and suture ligated with 0 Vicryl.  Excellent hemostasis was noted. The uterine arteries were then skeletonized bilaterally.  The uterine arteries were then clamped with parametrial clamps, transected and suture ligated with 0 Vicryl.  Again, excellent hemostasis was noted.  At this point, the uterine fundus was amputated with a scalpel above the uterine pedicles.  A Balfour retractor was then placed into the incision and the bowel was repacked with moistened laparotomy sponges.  The cardinal ligaments and uterosacral ligaments were then clamped with parametrial clamps, transected and suture ligated.  The remaining cervix was then amputated with scissors.  The remainder of the vaginal cuff was reapproximated with figure-of-eight sutures of the same.  Excellent hemostasis was noted.  The pelvis was then copiously irrigated.  The laparotomy sponges and the retractors were removed from the abdomen.  The fascia was then reapproximated with 0 PDS in a running fashion. The skin was reapproximated using staples.  At the close of the procedure, instrument and pack counts were said to be correct x 2.  The patient was taken to the PACU awake and in stable condition. Dictated by:   Roseanna Rainbow,  M.D. Attending Physician:  Altamese Cataio. DD:  09/16/00 TD:  09/17/00 Job: [id number]
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
## 2010-05-29 NOTE — Op Note (Signed)
[name], [name] NO.:  [id number]   MEDICAL RECORD NO.:  [id number]          PATIENT TYPE:  INP   LOCATION:  0445                         FACILITY:  [facility]   PHYSICIAN:  Lorre Munroe., M.D.DATE OF BIRTH:  1955/12/04   DATE OF PROCEDURE:  02/07/2004  DATE OF DISCHARGE:                                 OPERATIVE REPORT   PREOPERATIVE DIAGNOSIS:  Small bowel obstruction due to adhesions.   POSTOPERATIVE DIAGNOSIS:  Small bowel obstruction due to adhesions.   OPERATION:  Exploratory laparotomy with lysis of adhesions.   SURGEON:  Dr. Orson Slick   ANESTHESIA:  General.   PROCEDURE:  After the patient was monitored and anesthetized and had a Foley  catheter and routine preparation and draping of the abdomen, I cut out the  lower midline scar and dissected down through the fascia scar tissue,  removing some wire suture and entered the abdomen.  The omentum was adherent  to the anterior abdominal wall, and I carefully dissected that away from the  edges of the fascia and got a good entry into the lower abdomen.  There was  some bowel which was completely decompressed, and there was dilated proximal  small bowel.  All of the bowel appeared to be viable, and there was a  minimal amount of free fluid in the abdomen.  There was no foul odor or a  purulent appearance.  I traced the bowel down to the point of obstruction  and found it was a complex adhesion to the mid small bowel back to the  retroperitoneum.  When I cut that adhesion, it appeared to completely  resolve the small bowel of the obstruction.  I then got good hemostasis with  sutures and cautery and checked the remainder of the bowel, found a few  adhesions in the right lower quadrant, cut those.  I milked the contents of  the dilated small bowel back up into the stomach where it came out nicely  through the nasogastric tube.  I re-placed the small bowel in the abdominal  cavity and restored the omentum  toward the anterior part of the abdomen.  The sponge, needle, and instrument counts were correct.  I closed the fascia  with running #1 PDS tied at each end with simple knots and tied in the  middle with a simple know and irrigated the subcutaneous tissues and closed  the skin with staples.  The patient was stable through the operation.      WB/MEDQ  D:  02/07/2004  T:  02/07/2004  Job:  [id number]

## 2010-05-29 NOTE — Discharge Summary (Signed)
[name], [name]                          ACCOUNT NO.:  [id number]   MEDICAL RECORD NO.:  [id number]                   PATIENT TYPE:  INP   LOCATION:  1610                                 FACILITY:  [facility]   PHYSICIAN:  Lindaann Slough, M.D.               DATE OF BIRTH:  Oct 28, 1955   DATE OF ADMISSION:  03/14/2003  DATE OF DISCHARGE:                                 DISCHARGE SUMMARY   DISCHARGE DIAGNOSES:  1. Bilateral renal calculi.  2. Bilateral ureteral calculi.   PROCEDURE DONE:  1. Cystoscopy.  2. Retrograde pyelogram.  3. Ureteroscopy with stone extraction.  4. Insertion of double-J catheter bilaterally on March 15, 2003.   The patient is a 55 year old female, who was seen in the emergency room on  March 14, 2003, with sudden onset of right flank pain.  A CT scan of the  abdomen and pelvis showed bilateral small renal calculi and a 6 mm stone in  the right distal ureter with moderate hydronephrosis and a 4 mm stone in the  left distal ureter.  She was treated in the emergency room with analgesics,  but the pain recurred, and she was having nausea and vomiting.  She was then  admitted for stone manipulation.   PHYSICAL EXAMINATION:  VITAL SIGNS:  Blood pressure 125/71, pulse 69,  respirations 16, temperature 97.5.  LUNGS:  Clear.  HEART:  Regular rhythm.  ABDOMEN:  Soft, tender in the right flank and right lower quadrant, and she  had right CVA tenderness.   Urinalysis showed 11-20 WBCs, rare bacteria, and 0-2 RBCs.  Hemoglobin was  12.8, hematocrit 38.2, and WBC 7.3.  Sodium 139, potassium 3.2, BUN 11,  creatinine 1.3.   On March 15, 2003, she had cystoscopy, retrograde pyelogram, ureteroscopy  with bilateral ureteral stone extraction.  Postoperatively, she did very  well.  She did not have any more pain.  She was voiding well.  Her urine was  clear.  She was eating well.  She was then discharged home on March 15, 2003.   DISCHARGE MEDICATIONS:  1. Cipro 250 mg  b.i.d.  2. Darvocet-N 100, 1 tab q.4h. p.r.n. for pain.   The patient will return to the office next week for removal of the double-J  catheter.   CONDITION ON DISCHARGE:  Improved.   DISCHARGE DIET:  Regular.  The stones will be sent for stone analysis.                                               Lindaann Slough, M.D.    MN/MEDQ  D:  03/15/2003  T:  03/16/2003  Job:  [id number]

## 2010-05-29 NOTE — Op Note (Signed)
[place]  Patient:    [name], [name]                     MRN: [id number] Proc. Date: 10/27/99 Adm. Date:  [id number] Disc. Date: [id number] Attending:  Cornell Barman                           Operative Report  PREOPERATIVE DIAGNOSIS:  Internal derangement, left knee.  POSTOPERATIVE DIAGNOSIS:  Stretched anterior cruciate ligament, left knee, with flava synovitis, left knee.  OPERATION:  Partial synovectomy, left knee, and culture, left knee.  SURGEON:  Lenard Galloway. Chaney Malling, M.D.  ANESTHESIA:  General.  PATHOLOGY:  With knee arthroscope the knee and careful examination both compartments were undertaken.  Articular cartilage over both femoral condyles and both tibial plateaus.  The entire circumference of both meniscus were reasonably normal.  There was a little tearing of the posterior attachment of the knee to meniscus, but this is fairly mild.  There is a great deal of friable tissue floating in the joint fluid.  The synovium appeared enlarged and somewhat like a shagged rug.  This is very strange appearance.  This did not look like rheumatoid synovitis.  Synovium was somewhat friable.  The ACL had some hemorrhage in it.  This was slightly stretched.  This was palpated with a probe, and the ACL was loose, but not disrupted.  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  DESCRIPTION OF PROCEDURE:  The patient was placed on the operating room table in the supine position with pneumatic tourniquet on the left upper thigh. Left leg was placed in leg holder.  The entire left lower extremity was prepped with Duraprep and draped out in the usual manner.  An infusion cam was placed in the superomedial pouch and knee instilled with saline.  Anteromedial and anterolateral portals were made.  The arthroscope was introduced.  The findings are described above.  No specific pathology was seen of the  menisci except for some fraying of the posterior horn and medial meniscus, and this was debrided with a synovectomy shaver.  The rest of the meniscus appeared absolutely normal.  Again, the ACL was slightly stretched and could be displaced with a probe.  There were petechial hemorrhages in the ACL substance.  Synovial fluid was sent for aerobic and anaerobic cultures.  There was this friable material throughout the suprapatellar pouch and both lateral recesses with fragments of tissue floating in the synovial fluid.  The ______ shaver was used and synovium was debrided.  There was some evidence of osteoarthritic changes of the patellofemoral junction.  When this was completed, all instruments were removed and a large bulky dressing was applied.  The patient had returned to recovery room in excellent condition. The patient tolerated the procedure well.  FOLLOWUP CARE: 1. Follow up in my office in one week. 2. Check on cultures. DD:  10/27/99 TD:  10/28/99 Job: [id number]

## 2010-05-29 NOTE — Discharge Summary (Signed)
[place]  Patient:    ORI, KREITER Visit Number: [id number] MRN: [id number]          Service Type: MED Location: [phone] Attending Physician:  Altamese [place]. Dictated by:   Alwyn Pea, M.D. Admit Date:  09/02/2000 Discharge Date: 09/05/2000                             Discharge Summary  ADMITTING DIAGNOSIS:  Chest pain.  SECONDARY DIAGNOSES: 1. Hypertension 2. Anticoagulation.  PROCEDURES:  VQ scan and d-Dimer.  HOSPITAL COURSE:  This is a 55 year old African-American female who presented with right-sided sharp chest pain.  The patient is status post a hysterectomy several weeks prior to admission.  States that the pain is worse with deep breathing.  No nausea, vomiting, or diaphoresis.  A d-Dimer done in the emergency room was positive as well as VQ scan showed low probability.  The patient was admitted to rule out MI and treated for presumptive PE; however, given the recent surgery, the d-Dimer most likely represents a false positive.  The patient continued to do well.  She was ruled out for a MI and discharged to home in good condition on Lovenox and instructed to follow up with [facility] who are her primary physicians in two to three days after discharge. Dictated by:   Alwyn Pea, M.D. Attending Physician:  Altamese [place]. DD:  10/01/00 TD:  10/01/00 Job: [id number]

## 2010-05-29 NOTE — Op Note (Signed)
[name], [name]                          ACCOUNT NO.:  [id number]   MEDICAL RECORD NO.:  [id number]                   PATIENT TYPE:  INP   LOCATION:  6578                                 FACILITY:  [facility]   PHYSICIAN:  Lindaann Slough, M.D.               DATE OF BIRTH:  04/07/1955   DATE OF PROCEDURE:  03/15/2003  DATE OF DISCHARGE:                                 OPERATIVE REPORT   PREOPERATIVE DIAGNOSIS:  Bilateral ureteral calculi and bilateral renal  calculi.   POSTOPERATIVE DIAGNOSIS:  Bilateral ureteral calculi and bilateral renal  calculi.   OPERATION/PROCEDURE:  1. Cystoscopy.  2. Right retrograde pyelogram.  3. Ureteroscopy with bilateral stone extraction.  4. Bilateral insertion of double-J catheter.   SURGEON:  Lindaann Slough, M.D.   ANESTHESIA:  General.   INDICATIONS:  The patient is a 55 year old female who was seen in the  emergency room yesterday afternoon with sudden onset of severe right flank  pain associated with nausea and vomiting.  A CT scan of the abdomen and  pelvis showed bilateral small renal calculi and a 6 mm stone in the right  and distal ureter with hydronephrosis and a 4 mm stone in left distal  ureter.  The patient was then treated with analgesics; however, she  continued to complain of pain and she was admitted for further treatment.  She is scheduled today for stone manipulation.   DESCRIPTION OF PROCEDURE:  Under general anesthesia, the patient was prepped  and draped and placed in the dorsal lithotomy position.  A #22 Wappler  cystoscope was inserted in the bladder.  The bladder mucosa is normal.  There is no stone or tumor in the bladder.  The ureteral orifices are in  normal position and shape.  A cone-tip catheter was passed through the  cystoscope into the right ureteral orifice.  Contrast was then injected  through the cone-tip catheter.  The dye could not pass beyond the distal  ureter.  A glide wire was then passed through  the open-end catheter and  advanced through the cystoscope into the right ureter.  The cystoscope and  open-end catheter were then removed.  A #7-French ureteroscope was then  passed in the bladder and in the ureter.  The stone is visualized in the  distal ureter.  A nitinol basket was then passed into the ureteroscope and  the stone was extracted.  Then a glide wire was passed through an open-end  catheter and the glide wire was advanced into the left ureter.  The  cystoscope and open-end catheter were removed.  The ureteroscope was then  passed into the left ureter.  The left and distal ureteral stone was then  visualized and extracted with the nitinol basket without difficulty.  The  ureteroscope was then passed again into the left ureter and there was no  evidence of ureteral injury.  The ureteroscope was then passed in the  right  ureter.  A small stone fragment was seen in the ureter, was extracted with  the nitinol basket.  There was no evidence of ureteral injury or remaining  stone fragment in the right ureter after reinsertion of the ureteroscope  into the ureter.  Contrast was then injected through the ureteroscope and  the proximal ureter is dilated as well as the collecting system.  The  ureteroscope was passed in the left ureter and there was no evidence of  remaining stone in the left ureter.  The ureteroscope was then removed.  The  glide wire was then backloaded into the cystoscope and a 6-French 26 double-  J catheter was passed in the right ureter.  The same procedure was done on  the left side.  The proximal curl of each double-J catheter is in the  collecting system and the distal curl is in the bladder.   The bladder was then emptied and cystoscope and glide wires were removed.   The patient tolerated the procedure well and left the OR in satisfactory  condition to post anesthesia care unit.                                               Lindaann Slough, M.D.     MN/MEDQ  D:  03/15/2003  T:  03/15/2003  Job:  [id number]

## 2010-07-15 ENCOUNTER — Emergency Department (HOSPITAL_COMMUNITY)
Admission: EM | Admit: 2010-07-15 | Discharge: 2010-07-15 | Disposition: A | Discharge status: Home / Self Care | Payer: Medicaid Other | Attending: Emergency Medicine | Admitting: Emergency Medicine

## 2010-07-15 DIAGNOSIS — R1013 Epigastric pain: Secondary | ICD-10-CM | POA: Insufficient documentation

## 2010-07-15 DIAGNOSIS — Z79899 Other long term (current) drug therapy: Secondary | ICD-10-CM | POA: Insufficient documentation

## 2010-07-15 DIAGNOSIS — M069 Rheumatoid arthritis, unspecified: Secondary | ICD-10-CM | POA: Insufficient documentation

## 2010-07-15 DIAGNOSIS — F329 Major depressive disorder, single episode, unspecified: Secondary | ICD-10-CM | POA: Insufficient documentation

## 2010-07-15 DIAGNOSIS — R630 Anorexia: Secondary | ICD-10-CM | POA: Insufficient documentation

## 2010-07-15 DIAGNOSIS — F3289 Other specified depressive episodes: Secondary | ICD-10-CM | POA: Insufficient documentation

## 2010-07-15 DIAGNOSIS — R112 Nausea with vomiting, unspecified: Secondary | ICD-10-CM | POA: Insufficient documentation

## 2010-07-15 DIAGNOSIS — R12 Heartburn: Secondary | ICD-10-CM | POA: Insufficient documentation

## 2010-07-15 DIAGNOSIS — R197 Diarrhea, unspecified: Secondary | ICD-10-CM | POA: Insufficient documentation

## 2010-07-15 LAB — COMPREHENSIVE METABOLIC PANEL
ALT: 9 U/L (ref 0–35)
AST: 12 U/L (ref 0–37)
Albumin: 3.7 g/dL (ref 3.5–5.2)
Alkaline Phosphatase: 111 U/L (ref 39–117)
Chloride: 105 mEq/L (ref 96–112)
Potassium: 3.8 mEq/L (ref 3.5–5.1)
Sodium: 138 mEq/L (ref 135–145)
Total Bilirubin: 0.3 mg/dL (ref 0.3–1.2)

## 2010-07-15 LAB — DIFFERENTIAL
Basophils Absolute: 0 10*3/uL (ref 0.0–0.1)
Eosinophils Absolute: 0.2 10*3/uL (ref 0.0–0.7)
Eosinophils Relative: 2 % (ref 0–5)

## 2010-07-15 LAB — CBC
Platelets: 275 10*3/uL (ref 150–400)
RDW: 13.1 % (ref 11.5–15.5)
WBC: 7.7 10*3/uL (ref 4.0–10.5)

## 2010-07-29 ENCOUNTER — Emergency Department (HOSPITAL_COMMUNITY)
Admission: EM | Admit: 2010-07-29 | Discharge: 2010-07-29 | Disposition: A | Discharge status: Home / Self Care | Payer: Medicaid Other | Attending: Emergency Medicine | Admitting: Emergency Medicine

## 2010-07-29 ENCOUNTER — Emergency Department (HOSPITAL_COMMUNITY): Payer: Medicaid Other

## 2010-07-29 DIAGNOSIS — Y92009 Unspecified place in unspecified non-institutional (private) residence as the place of occurrence of the external cause: Secondary | ICD-10-CM | POA: Insufficient documentation

## 2010-07-29 DIAGNOSIS — S1093XA Contusion of unspecified part of neck, initial encounter: Secondary | ICD-10-CM | POA: Insufficient documentation

## 2010-07-29 DIAGNOSIS — S2249XA Multiple fractures of ribs, unspecified side, initial encounter for closed fracture: Secondary | ICD-10-CM | POA: Insufficient documentation

## 2010-07-29 DIAGNOSIS — R079 Chest pain, unspecified: Secondary | ICD-10-CM | POA: Insufficient documentation

## 2010-07-29 DIAGNOSIS — S0003XA Contusion of scalp, initial encounter: Secondary | ICD-10-CM | POA: Insufficient documentation

## 2010-08-25 ENCOUNTER — Emergency Department (HOSPITAL_COMMUNITY)
Admission: EM | Admit: 2010-08-25 | Discharge: 2010-08-25 | Disposition: A | Discharge status: Home / Self Care | Payer: Medicaid Other | Attending: Emergency Medicine | Admitting: Emergency Medicine

## 2010-08-25 ENCOUNTER — Emergency Department (HOSPITAL_COMMUNITY): Payer: Medicaid Other

## 2010-08-25 DIAGNOSIS — S2239XA Fracture of one rib, unspecified side, initial encounter for closed fracture: Secondary | ICD-10-CM | POA: Insufficient documentation

## 2010-08-25 DIAGNOSIS — R0609 Other forms of dyspnea: Secondary | ICD-10-CM | POA: Insufficient documentation

## 2010-08-25 DIAGNOSIS — R0989 Other specified symptoms and signs involving the circulatory and respiratory systems: Secondary | ICD-10-CM | POA: Insufficient documentation

## 2010-08-25 DIAGNOSIS — R079 Chest pain, unspecified: Secondary | ICD-10-CM | POA: Insufficient documentation

## 2010-08-25 DIAGNOSIS — R071 Chest pain on breathing: Secondary | ICD-10-CM | POA: Insufficient documentation

## 2010-08-25 DIAGNOSIS — W19XXXA Unspecified fall, initial encounter: Secondary | ICD-10-CM | POA: Insufficient documentation

## 2010-09-30 ENCOUNTER — Other Ambulatory Visit: Payer: Self-pay | Admitting: Internal Medicine

## 2010-09-30 DIAGNOSIS — Z1231 Encounter for screening mammogram for malignant neoplasm of breast: Secondary | ICD-10-CM

## 2010-10-12 LAB — BASIC METABOLIC PANEL
BUN: 6
GFR calc Af Amer: >60
GFR calc non Af Amer: 59 — ABNORMAL LOW
Potassium: 4
Sodium: 138

## 2010-10-12 LAB — COMPREHENSIVE METABOLIC PANEL
BUN: 7
CO2: 25
Chloride: 106
Creatinine, Ser: 1.08
GFR calc non Af Amer: 53 — ABNORMAL LOW
Total Bilirubin: 0.4

## 2010-10-12 LAB — DIFFERENTIAL
Basophils Absolute: 0
Lymphocytes Relative: 23
Neutro Abs: 3.5
Neutrophils Relative %: 67

## 2010-10-12 LAB — URINALYSIS, ROUTINE W REFLEX MICROSCOPIC
Bilirubin Urine: NEGATIVE
Hgb urine dipstick: NEGATIVE
Nitrite: NEGATIVE
Protein, ur: NEGATIVE
Urobilinogen, UA: 4 — ABNORMAL HIGH

## 2010-10-12 LAB — POCT CARDIAC MARKERS
CKMB, poc: <1 — ABNORMAL LOW
Myoglobin, poc: 60.1
Troponin i, poc: <0.05

## 2010-10-12 LAB — CBC
HCT: 34.7 — ABNORMAL LOW
HCT: 38.7
Hemoglobin: 11.9 — ABNORMAL LOW
MCHC: 33.4
MCV: 93.3
Platelets: 282
RBC: 4.15
WBC: 4.9
WBC: 5.2

## 2010-10-12 LAB — LIPASE, BLOOD: Lipase: 19

## 2010-10-12 LAB — CARDIAC PANEL(CRET KIN+CKTOT+MB+TROPI)
CK, MB: 0.5
Total CK: 29

## 2010-10-12 LAB — URINE MICROSCOPIC-ADD ON

## 2010-10-21 ENCOUNTER — Ambulatory Visit (INDEPENDENT_AMBULATORY_CARE_PROVIDER_SITE_OTHER): Payer: Medicaid Other

## 2010-10-21 ENCOUNTER — Inpatient Hospital Stay (INDEPENDENT_AMBULATORY_CARE_PROVIDER_SITE_OTHER)
Admission: RE | Admit: 2010-10-21 | Discharge: 2010-10-21 | Disposition: A | Discharge status: Home / Self Care | Payer: Medicaid Other | Source: Ambulatory Visit | Attending: Family Medicine | Admitting: Family Medicine

## 2010-10-21 DIAGNOSIS — S20219A Contusion of unspecified front wall of thorax, initial encounter: Secondary | ICD-10-CM

## 2010-12-02 ENCOUNTER — Telehealth: Payer: Self-pay | Admitting: Pulmonary Disease

## 2010-12-02 NOTE — Telephone Encounter (Signed)
[email or web address]  

## 2010-12-07 NOTE — Telephone Encounter (Signed)
PT RETURNED CALL. PLEASE CALL [phone]. [name]

## 2010-12-07 NOTE — Telephone Encounter (Signed)
Pt advised. Jennifer Castillo, CMA  

## 2010-12-07 NOTE — Telephone Encounter (Signed)
I spoke with Ms, [name] and she states her mother [name] [birth year]19 was a patient of Dr. Kirstie Mirza before she died. [name] states she is having similar symptoms that her mother used to have and Dr. Kriste Basque was the only doctor that could help her and she wants to become a patient of Dr. Kriste Basque. I advised her that he is not accepting new patients, but she request that we ask him because he took such good care of her mother. I have requested the paper chart for [name]. Carron Curie, CMA

## 2010-12-07 NOTE — Telephone Encounter (Signed)
Per SN---sorry but we are not able to take any new patients at this time.  We can help her find a primary care doctor in the area that she lives if she would like help with that.  Our schedule is just so overbooked at this time.  thanks

## 2010-12-07 NOTE — Telephone Encounter (Signed)
[email or web address]. Jennifer Castillo, CMA  
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
## 2011-11-23 ENCOUNTER — Encounter (HOSPITAL_COMMUNITY): Payer: Self-pay | Admitting: Radiology

## 2011-11-23 ENCOUNTER — Emergency Department (HOSPITAL_COMMUNITY): Payer: Medicaid Other

## 2011-11-23 ENCOUNTER — Emergency Department (HOSPITAL_COMMUNITY)
Admission: EM | Admit: 2011-11-23 | Discharge: 2011-11-23 | Disposition: A | Discharge status: Home / Self Care | Payer: Medicaid Other | Attending: Emergency Medicine | Admitting: Emergency Medicine

## 2011-11-23 DIAGNOSIS — Z87891 Personal history of nicotine dependence: Secondary | ICD-10-CM | POA: Insufficient documentation

## 2011-11-23 DIAGNOSIS — Z79899 Other long term (current) drug therapy: Secondary | ICD-10-CM | POA: Insufficient documentation

## 2011-11-23 DIAGNOSIS — R1110 Vomiting, unspecified: Secondary | ICD-10-CM | POA: Insufficient documentation

## 2011-11-23 DIAGNOSIS — J40 Bronchitis, not specified as acute or chronic: Secondary | ICD-10-CM | POA: Insufficient documentation

## 2011-11-23 DIAGNOSIS — J069 Acute upper respiratory infection, unspecified: Secondary | ICD-10-CM | POA: Insufficient documentation

## 2011-11-23 LAB — BASIC METABOLIC PANEL
BUN: 20 mg/dL (ref 6–23)
CO2: 24 mEq/L (ref 19–32)
Calcium: 9.5 mg/dL (ref 8.4–10.5)
Chloride: 102 mEq/L (ref 96–112)
Creatinine, Ser: 1.13 mg/dL — ABNORMAL HIGH (ref 0.50–1.10)
Glucose, Bld: 95 mg/dL (ref 70–99)

## 2011-11-23 LAB — CBC WITH DIFFERENTIAL/PLATELET
Lymphs Abs: 2.5 10*3/uL (ref 0.7–4.0)
MCH: 30.1 pg (ref 26.0–34.0)
Neutrophils Relative %: 55 % (ref 43–77)
Platelets: 307 10*3/uL (ref 150–400)
RBC: 5.02 MIL/uL (ref 3.87–5.11)

## 2011-11-23 MED ORDER — SODIUM CHLORIDE 0.9 % IV SOLN
Freq: Once | INTRAVENOUS | Status: AC
  Administered 2011-11-23: 20:00:00 via INTRAVENOUS

## 2011-11-23 MED ORDER — KETOROLAC TROMETHAMINE 30 MG/ML IJ SOLN
30.0000 mg | Freq: Once | INTRAMUSCULAR | Status: AC
  Administered 2011-11-23: 30 mg via INTRAVENOUS
  Filled 2011-11-23: qty 1

## 2011-11-23 MED ORDER — BENZONATATE 100 MG PO CAPS: 100.0000 mg | ORAL_CAPSULE | Freq: Three times a day (TID) | ORAL | Status: DC

## 2011-11-23 MED ORDER — AZITHROMYCIN 250 MG PO TABS: ORAL_TABLET | ORAL | Status: DC

## 2011-11-23 NOTE — ED Provider Notes (Signed)
History     CSN: [id number]  Arrival date & time 11/23/11  1815   First MD Initiated Contact with Patient 11/23/11 1859      Chief Complaint  Patient presents with  . Chest Pain    (Consider location/radiation/quality/duration/timing/severity/associated sxs/prior treatment) HPI Comments: Patient presents with complaint of pleuritic CP, cough, and fever for 3 days. Patient states that she has COPD and is a current smoker. Patient has used her home inhalers without improvement. Denies fever or chills. Denies nausea but reports one episode of post-tussive emesis. Denies SOB, wheezing, or hemoptysis.   The history is provided by the patient. No language interpreter was used.    Past Medical History  Diagnosis Date  . Active smoker     No past surgical history on file.  No family history on file.  History  Substance Use Topics  . Smoking status: Not on file  . Smokeless tobacco: Not on file  . Alcohol Use: Not on file    OB History    Grav Para Term Preterm Abortions TAB SAB Ect Mult Living                  Review of Systems  Constitutional: Negative for fever and chills.  Respiratory: Positive for cough. Negative for shortness of breath and wheezing.   Gastrointestinal: Positive for vomiting. Negative for nausea.    Allergies  Iohexol and Penicillins  Home Medications   Current Outpatient Rx  Name  Route  Sig  Dispense  Refill  . ALBUTEROL SULFATE HFA 108 (90 BASE) MCG/ACT IN AERS   Inhalation   Inhale 2 puffs into the lungs 2 (two) times daily.         . BECLOMETHASONE DIPROPIONATE 80 MCG/ACT IN AERS   Inhalation   Inhale 1 puff into the lungs 2 (two) times daily.         Marland Kitchen BENZONATATE 100 MG PO CAPS   Oral   Take 100 mg by mouth 3 (three) times daily as needed.         . BUPROPION HCL ER (XL) 150 MG PO TB24   Oral   Take 150 mg by mouth daily.         Marland Kitchen CIPROFLOXACIN HCL 250 MG PO TABS   Oral   Take 250 mg by mouth every 12 (twelve)  hours. Filled 10.24.13 for 7 days. 10.31.13         . DICLOFENAC SODIUM 75 MG PO TBEC   Oral   Take 75 mg by mouth 2 (two) times daily.         Marland Kitchen LISINOPRIL-HYDROCHLOROTHIAZIDE 20-12.5 MG PO TABS   Oral   Take 1 tablet by mouth daily.         Marland Kitchen LORATADINE 10 MG PO TABS   Oral   Take 10 mg by mouth daily as needed.         . METHOCARBAMOL 500 MG PO TABS   Oral   Take 500 mg by mouth 3 (three) times daily.         Marland Kitchen OMEPRAZOLE 20 MG PO CPDR   Oral   Take 20 mg by mouth daily.         Marland Kitchen PREDNISONE 10 MG PO TABS   Oral   Take 10 mg by mouth daily. Filled 10.18.13 taper dose. Pt is now down to 1 tab a day         . TRAMADOL HCL ER 100 MG PO TB24  Oral   Take 100 mg by mouth 3 (three) times daily as needed.         Marland Kitchen ZIPRASIDONE HCL 20 MG PO CAPS   Oral   Take 20 mg by mouth 2 (two) times daily with a meal.         . AZITHROMYCIN 250 MG PO TABS      2 po day one, then 1 daily x 4 days   5 tablet   0   . BENZONATATE 100 MG PO CAPS   Oral   Take 1 capsule (100 mg total) by mouth every 8 (eight) hours.   21 capsule   0     BP 119/94  Pulse 74  Temp 98.9 F (37.2 C) (Oral)  Resp 21  SpO2 96%  Physical Exam  Nursing note and vitals reviewed. Constitutional: She appears well-developed and well-nourished. No distress.  HENT:  Head: Normocephalic and atraumatic.  Mouth/Throat: Oropharynx is clear and moist. No oropharyngeal exudate.  Eyes: Conjunctivae normal and EOM are normal. No scleral icterus.  Neck: Normal range of motion. Neck supple.  Cardiovascular: Normal rate, regular rhythm and normal heart sounds.   Pulmonary/Chest: Effort normal and breath sounds normal. She has no wheezes. She has no rales. She exhibits no tenderness.  Abdominal: Soft. Bowel sounds are normal. There is no tenderness.  Lymphadenopathy:    She has no cervical adenopathy.  Neurological: She is alert.  Skin: Skin is warm and dry.    ED Course  Procedures  (including critical care time)  Labs Reviewed  CBC WITH DIFFERENTIAL - Abnormal; Notable for the following:    Hemoglobin 15.1 (*)     All other components within normal limits  BASIC METABOLIC PANEL - Abnormal; Notable for the following:    Creatinine, Ser 1.13 (*)     GFR calc non Af Amer 53 (*)     GFR calc Af Amer 61 (*)     All other components within normal limits  TROPONIN I   Results for orders placed during the hospital encounter of 11/23/11  CBC WITH DIFFERENTIAL      Component Value Range   WBC 7.0  4.0 - 10.5 K/uL   RBC 5.02  3.87 - 5.11 MIL/uL   Hemoglobin 15.1 (*) 12.0 - 15.0 g/dL   HCT 14.7  82.9 - 56.2 %   MCV 88.2  78.0 - 100.0 fL   MCH 30.1  26.0 - 34.0 pg   MCHC 34.1  30.0 - 36.0 g/dL   RDW 13.0  86.5 - 78.4 %   Platelets 307  150 - 400 K/uL   Neutrophils Relative 55  43 - 77 %   Neutro Abs 3.8  1.7 - 7.7 K/uL   Lymphocytes Relative 36  12 - 46 %   Lymphs Abs 2.5  0.7 - 4.0 K/uL   Monocytes Relative 6  3 - 12 %   Monocytes Absolute 0.4  0.1 - 1.0 K/uL   Eosinophils Relative 2  0 - 5 %   Eosinophils Absolute 0.2  0.0 - 0.7 K/uL   Basophils Relative 0  0 - 1 %   Basophils Absolute 0.0  0.0 - 0.1 K/uL  BASIC METABOLIC PANEL      Component Value Range   Sodium 138  135 - 145 mEq/L   Potassium 4.3  3.5 - 5.1 mEq/L   Chloride 102  96 - 112 mEq/L   CO2 24  19 - 32 mEq/L  Glucose, Bld 95  70 - 99 mg/dL   BUN 20  6 - 23 mg/dL   Creatinine, Ser 4.09 (*) 0.50 - 1.10 mg/dL   Calcium 9.5  8.4 - 81.1 mg/dL   GFR calc non Af Amer 53 (*) >90 mL/min   GFR calc Af Amer 61 (*) >90 mL/min  TROPONIN I      Component Value Range   Troponin I <0.30  <0.30 ng/mL    Dg Chest 2 View  11/23/2011  *RADIOLOGY REPORT*  Clinical Data: Shortness of breath and coughing  CHEST - 2 VIEW  Comparison: 10/21/2010  Findings: Heart size is normal.  No pleural effusion or edema.  No airspace consolidation identified.  Chronic appearing bronchitic changes are noted.  There are old  left posterior rib deformities, similar to previous exam.  IMPRESSION:  1.  No acute findings. 2.  Chronic bronchitic change.   Original Report Authenticated By: Signa Kell, M.D.    Date: 11/23/2011  Rate: 82  Rhythm: normal sinus rhythm  QRS Axis: normal  Intervals: normal  ST/T Wave abnormalities: normal  Conduction Disutrbances:none  Narrative Interpretation: NO STEMI  Old EKG Reviewed: unchanged     1. Bronchitis   2. URI (upper respiratory infection)       MDM  Patient presented with symptoms of a URI.CBC: unremarkable BMP: remarkable for known renal insufficiency. Troponin: negative. CXR: remarkable for bronchitic changes. No wheezes or rhonchi on exam. Patient discharged with Rx for Azithromax and tessalon. Return precautions given. No red flags for PNA, pneumothorax, or AMI.       Pixie Casino, PA-C 11/23/11 2229

## 2011-11-23 NOTE — ED Notes (Signed)
Pt presents with productive cough, N, V low grade fever and left sternal CP X 3 days.  Pt received 324mg  of ASA.

## 2011-11-23 NOTE — ED Notes (Signed)
Patient transported to X-ray 

## 2011-11-23 NOTE — ED Provider Notes (Signed)
Medical screening examination/treatment/procedure(s) were performed by non-physician practitioner and as supervising physician I was immediately available for consultation/collaboration.  Ethelda Chick, MD 11/23/11 2240
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
## 2012-03-06 IMAGING — CR DG CHEST 2V
2 series · 2 of 2 positions shown · non-contrast
Comparison: 07/29/2010

CLINICAL DATA: Chest pain

CHEST - 2 VIEW

[w chest pa]
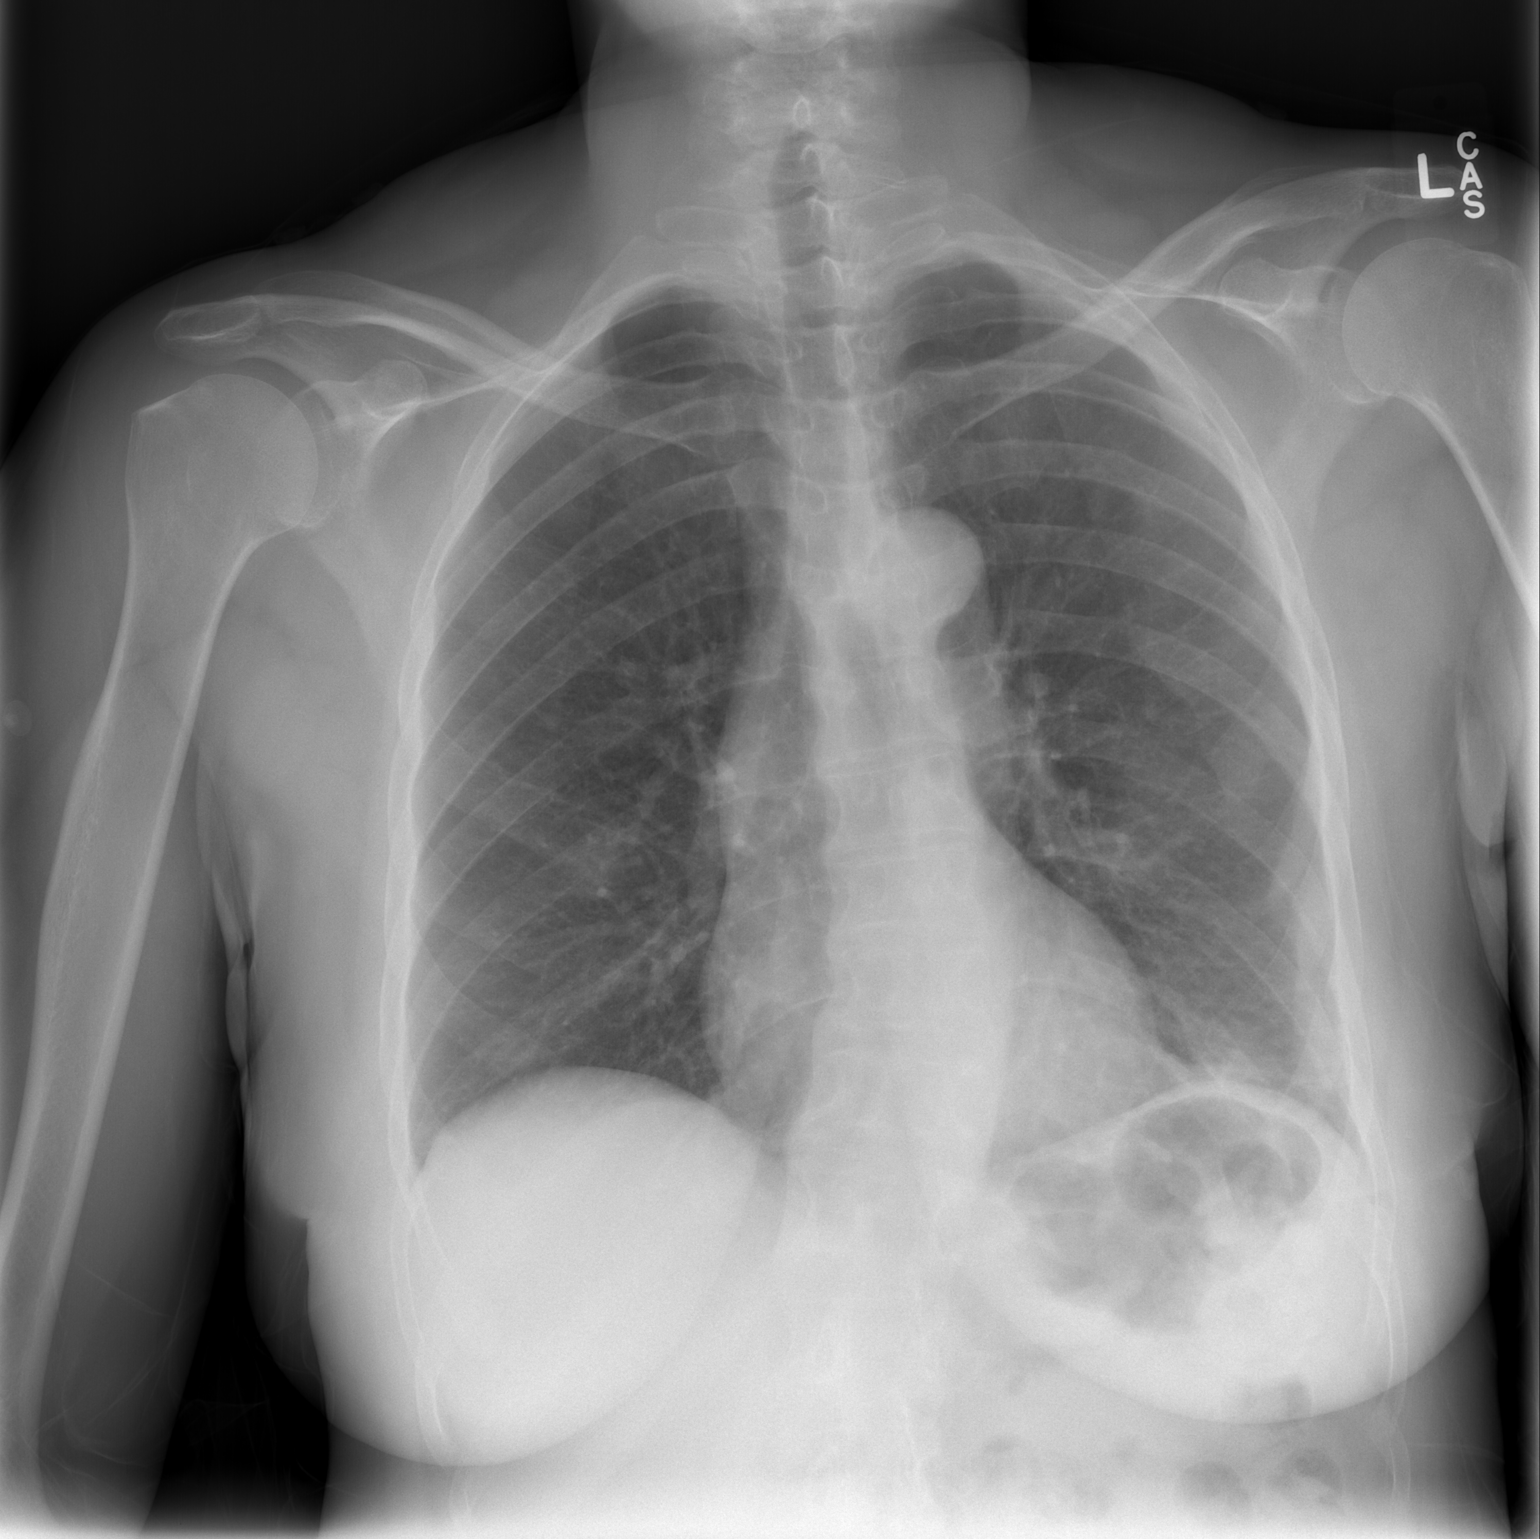

[w chest lat]
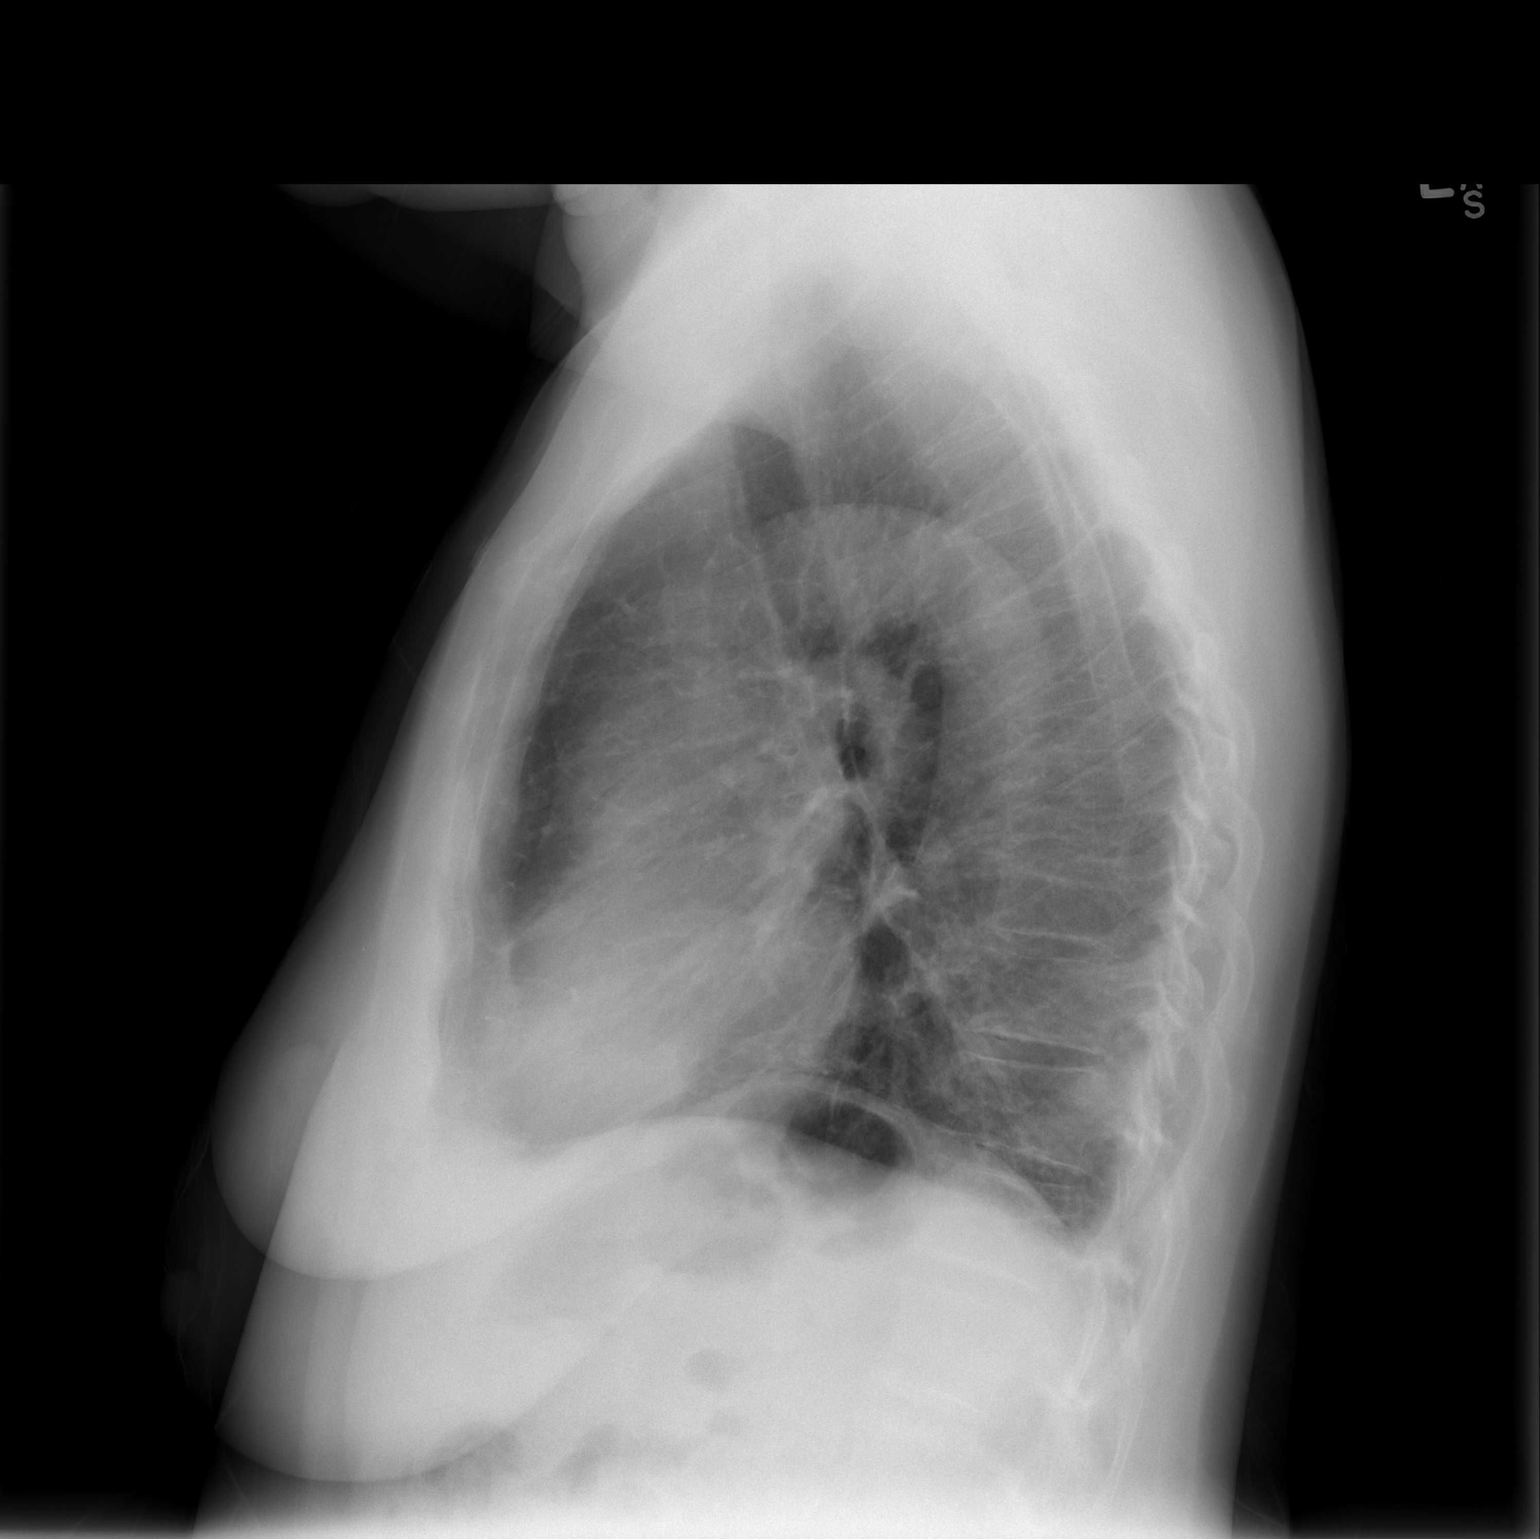

[2 of 2 positions shown; findings below may reference images not displayed]

FINDINGS: Minimal patchy opacity at the left lung base, likely
atelectasis.  Lungs otherwise clear. No pleural effusion or
pneumothorax.

The heart is top normal in size.

Degenerative changes of the visualized thoracolumbar spine.  Two
suspected fracture deformities of the left seventh and eighth ribs.
At least two suspected left lower rib deformities from prior
fracture.
IMPRESSION: No evidence of acute cardiopulmonary disease.

## 2012-07-01 ENCOUNTER — Encounter (HOSPITAL_COMMUNITY): Payer: Self-pay

## 2012-07-01 ENCOUNTER — Emergency Department (HOSPITAL_COMMUNITY): Payer: Medicaid Other

## 2012-07-01 ENCOUNTER — Emergency Department (HOSPITAL_COMMUNITY)
Admission: EM | Admit: 2012-07-01 | Discharge: 2012-07-01 | Disposition: A | Discharge status: Home / Self Care | Payer: Medicaid Other | Attending: Emergency Medicine | Admitting: Emergency Medicine

## 2012-07-01 DIAGNOSIS — F17200 Nicotine dependence, unspecified, uncomplicated: Secondary | ICD-10-CM | POA: Insufficient documentation

## 2012-07-01 DIAGNOSIS — M069 Rheumatoid arthritis, unspecified: Secondary | ICD-10-CM | POA: Insufficient documentation

## 2012-07-01 DIAGNOSIS — Z791 Long term (current) use of non-steroidal anti-inflammatories (NSAID): Secondary | ICD-10-CM | POA: Insufficient documentation

## 2012-07-01 DIAGNOSIS — IMO0002 Reserved for concepts with insufficient information to code with codable children: Secondary | ICD-10-CM | POA: Insufficient documentation

## 2012-07-01 DIAGNOSIS — M2550 Pain in unspecified joint: Secondary | ICD-10-CM

## 2012-07-01 DIAGNOSIS — Z79899 Other long term (current) drug therapy: Secondary | ICD-10-CM | POA: Insufficient documentation

## 2012-07-01 DIAGNOSIS — I10 Essential (primary) hypertension: Secondary | ICD-10-CM | POA: Insufficient documentation

## 2012-07-01 DIAGNOSIS — Z792 Long term (current) use of antibiotics: Secondary | ICD-10-CM | POA: Insufficient documentation

## 2012-07-01 DIAGNOSIS — Z880 Allergy status to penicillin: Secondary | ICD-10-CM | POA: Insufficient documentation

## 2012-07-01 HISTORY — DX: Rheumatoid arthritis, unspecified: M06.9

## 2012-07-01 HISTORY — DX: Essential (primary) hypertension: I10

## 2012-07-01 LAB — CBC WITH DIFFERENTIAL/PLATELET
Eosinophils Absolute: 0.2 10*3/uL (ref 0.0–0.7)
Hemoglobin: 12.1 g/dL (ref 12.0–15.0)
Lymphs Abs: 1.1 10*3/uL (ref 0.7–4.0)
MCH: 28.9 pg (ref 26.0–34.0)
Monocytes Relative: 11 % (ref 3–12)
Neutro Abs: 2.7 10*3/uL (ref 1.7–7.7)
Neutrophils Relative %: 60 % (ref 43–77)
RBC: 4.19 MIL/uL (ref 3.87–5.11)

## 2012-07-01 LAB — BASIC METABOLIC PANEL
BUN: 10 mg/dL (ref 6–23)
Chloride: 104 mEq/L (ref 96–112)
Glucose, Bld: 104 mg/dL — ABNORMAL HIGH (ref 70–99)
Potassium: 3.4 mEq/L — ABNORMAL LOW (ref 3.5–5.1)

## 2012-07-01 LAB — URINALYSIS, ROUTINE W REFLEX MICROSCOPIC
Bilirubin Urine: NEGATIVE
Hgb urine dipstick: NEGATIVE
Specific Gravity, Urine: 1.029 (ref 1.005–1.030)
pH: 6 (ref 5.0–8.0)

## 2012-07-01 LAB — RAPID URINE DRUG SCREEN, HOSP PERFORMED
Barbiturates: NOT DETECTED
Benzodiazepines: NOT DETECTED
Cocaine: POSITIVE — AB
Tetrahydrocannabinol: NOT DETECTED

## 2012-07-01 MED ORDER — HYDROCODONE-ACETAMINOPHEN 5-325 MG PO TABS: 1.0000 | ORAL_TABLET | Freq: Four times a day (QID) | ORAL | Status: DC | PRN

## 2012-07-01 MED ORDER — SODIUM CHLORIDE 0.9 % IV BOLUS (SEPSIS)
1000.0000 mL | Freq: Once | INTRAVENOUS | Status: DC
  Administered 2012-07-01: 1000 mL via INTRAVENOUS

## 2012-07-01 MED ORDER — PREDNISONE 50 MG PO TABS: 50.0000 mg | ORAL_TABLET | Freq: Every day | ORAL | Status: DC

## 2012-07-01 MED ORDER — IBUPROFEN 600 MG PO TABS: 600.0000 mg | ORAL_TABLET | Freq: Four times a day (QID) | ORAL | Status: DC | PRN

## 2012-07-01 MED ORDER — MORPHINE SULFATE 4 MG/ML IJ SOLN
4.0000 mg | Freq: Once | INTRAMUSCULAR | Status: AC
  Administered 2012-07-01: 4 mg via INTRAVENOUS
  Filled 2012-07-01: qty 1

## 2012-07-01 NOTE — ED Notes (Signed)
She remains sleepy and relaxed in appearance.  We inform her that she is being d/c'd., to which she replies "I am staying here in [place] with a lady--I can't remember her name".  C.N. Notified to explore other options.

## 2012-07-01 NOTE — ED Notes (Signed)
ZOX:[id number]<AV> Expected date:07/01/12<BR> Expected time: 7:31 AM<BR> Means of arrival:Ambulance<BR> Comments:<BR> gen arthritis pain

## 2012-07-01 NOTE — ED Notes (Signed)
She c/o polymyalgias, essentially every joint in her body; the worst being bilat. Ankles and right wrist.  She denies any recent illness; and denies any trauma.  She states she has not been able to afford her usual meds.

## 2012-07-01 NOTE — ED Notes (Signed)
We are currently assisting her with getting dressed and are attempting again to phone her friend for a ride home.  Our C.N., Patty, has just informed her we will be taking her to our lobby shortly to await her ride.  Pt. arouses easily and is in no distress.

## 2012-07-01 NOTE — ED Notes (Signed)
Dr. Rhunette Croft speaks with pt. At length about his findings thus far and the need for a work-up with which she agrees.  She is more relaxed in appearance.

## 2012-07-01 NOTE — ED Provider Notes (Addendum)
History     CSN: [id number]  Arrival date & time 07/01/12  [id number]   First MD Initiated Contact with Patient 07/01/12 0740      Chief Complaint  Patient presents with  . Joint Pain    (Consider location/radiation/quality/duration/timing/severity/associated sxs/prior treatment) HPI Comments: Pt comes in with cc of multiple joint pain. States that she has been diagnosed with RA - but unable to provide specific details - states that she hasnt followed up with Rheumatologist.  Comes in with 1 days of worsening diffuse joint pains. She has pains everywhere - but they are worst over left shoulder, right wrist and left knee. No n/v/f/c. No rash. Pt is on tramadol for pain.  The history is provided by the patient.    Past Medical History  Diagnosis Date  . Active smoker   . Rheumatoid arthritis 1  . Hypertension     No past surgical history on file.  History reviewed. No pertinent family history.  History  Substance Use Topics  . Smoking status: Current Every Day Smoker -- 1.00 packs/day    Types: Cigarettes  . Smokeless tobacco: Not on file  . Alcohol Use: No    OB History   Grav Para Term Preterm Abortions TAB SAB Ect Mult Living                  Review of Systems  Constitutional: Positive for activity change.  HENT: Negative for neck pain.   Respiratory: Negative for shortness of breath.   Cardiovascular: Negative for chest pain.  Gastrointestinal: Negative for nausea, vomiting and abdominal pain.  Genitourinary: Negative for dysuria.  Musculoskeletal: Positive for arthralgias.  Skin: Negative for rash.  Neurological: Negative for headaches.    Allergies  Iohexol and Penicillins  Home Medications   Current Outpatient Rx  Name  Route  Sig  Dispense  Refill  . albuterol (PROVENTIL HFA;VENTOLIN HFA) 108 (90 BASE) MCG/ACT inhaler   Inhalation   Inhale 2 puffs into the lungs 2 (two) times daily.         . beclomethasone (QVAR) 80 MCG/ACT inhaler    Inhalation   Inhale 1 puff into the lungs 2 (two) times daily.         . benzonatate (TESSALON) 100 MG capsule   Oral   Take 100 mg by mouth 3 (three) times daily as needed.         . benzonatate (TESSALON) 100 MG capsule   Oral   Take 1 capsule (100 mg total) by mouth every 8 (eight) hours.   21 capsule   0   . buPROPion (WELLBUTRIN XL) 150 MG 24 hr tablet   Oral   Take 150 mg by mouth daily.         . ciprofloxacin (CIPRO) 250 MG tablet   Oral   Take 250 mg by mouth every 12 (twelve) hours. Filled 10.24.13 for 7 days. 10.31.13         . diclofenac (VOLTAREN) 75 MG EC tablet   Oral   Take 75 mg by mouth 2 (two) times daily.         Marland Kitchen lisinopril-hydrochlorothiazide (PRINZIDE,ZESTORETIC) 20-12.5 MG per tablet   Oral   Take 1 tablet by mouth daily.         Marland Kitchen loratadine (CLARITIN) 10 MG tablet   Oral   Take 10 mg by mouth daily as needed.         . methocarbamol (ROBAXIN) 500 MG tablet   Oral  Take 500 mg by mouth 3 (three) times daily.         Marland Kitchen omeprazole (PRILOSEC) 20 MG capsule   Oral   Take 20 mg by mouth daily.         . traMADol (ULTRAM-ER) 100 MG 24 hr tablet   Oral   Take 100 mg by mouth 3 (three) times daily as needed.         . ziprasidone (GEODON) 20 MG capsule   Oral   Take 20 mg by mouth 2 (two) times daily with a meal.           BP 145/80  Pulse 75  Temp(Src) 98.5 F (36.9 C) (Oral)  SpO2 98%  Physical Exam  Nursing note and vitals reviewed. Constitutional: She is oriented to person, place, and time. She appears well-developed and well-nourished.  HENT:  Head: Normocephalic and atraumatic.  Eyes: EOM are normal. Pupils are equal, round, and reactive to light.  Neck: Neck supple.  Cardiovascular: Normal rate, regular rhythm and normal heart sounds.   No murmur heard. Pulmonary/Chest: Effort normal. No respiratory distress.  Abdominal: Soft. She exhibits no distension. There is no tenderness. There is no rebound and  no guarding.  Musculoskeletal:  Bilateral shoulder, elbow,wrist, knee evaluated. There is no erythema, no warmth to touch. Left knee has some effusion. Pt has tenderness with just a mild touch to all her joints. ROM compromised due to pain.    Neurological: She is alert and oriented to person, place, and time.  Skin: Skin is warm and dry.    ED Course  Procedures (including critical care time)  Labs Reviewed  CBC WITH DIFFERENTIAL  BASIC METABOLIC PANEL  URINALYSIS, ROUTINE W REFLEX MICROSCOPIC  URINE RAPID DRUG SCREEN (HOSP PERFORMED)   No results found.   No diagnosis found.    MDM  Pt comes in with cc of arthralgias. States that she has RA, not on any specific meds for it. No rash, no weight loss, no n/v/f/c - just diffuse pain. The pain is severe, and has affected her ability to function. No rashes, no fevers, chills, no signs of infection or sx of infection.  Will get xrays of 2 of the painful joints. Pain control and reassess. Will d/c if better.   Derwood Kaplan, MD 07/01/12 1610  11:48 AM Xray s not suggestive of RA, which is consistent with my exam findings. Pt has polyarthralgias, with no ither systemic sx and no fevers. Will ask PCP follow up with possible rheu workup. I had added esr - and it is slightly elevated, so suspect inflammatory arthtropathy. Will give prednisone.   Derwood Kaplan, MD 07/01/12 1150

## 2012-07-01 NOTE — ED Notes (Signed)
She tearfully tells me she has hx of rheumatoid arthritis; and sees Dr. Tyson Dense for this; but has been unable to obtain some of her meds "for a while now".

## 2012-10-07 ENCOUNTER — Inpatient Hospital Stay (HOSPITAL_COMMUNITY)
Admission: EM | Admit: 2012-10-07 | Discharge: 2012-10-11 | DRG: 392 | Disposition: A | Discharge status: Home / Self Care | Payer: Medicaid Other | Attending: Family Medicine | Admitting: Family Medicine

## 2012-10-07 ENCOUNTER — Emergency Department (HOSPITAL_COMMUNITY): Payer: Medicaid Other

## 2012-10-07 ENCOUNTER — Encounter (HOSPITAL_COMMUNITY): Payer: Self-pay

## 2012-10-07 DIAGNOSIS — I10 Essential (primary) hypertension: Secondary | ICD-10-CM | POA: Diagnosis present

## 2012-10-07 DIAGNOSIS — M25521 Pain in right elbow: Secondary | ICD-10-CM

## 2012-10-07 DIAGNOSIS — K219 Gastro-esophageal reflux disease without esophagitis: Secondary | ICD-10-CM

## 2012-10-07 DIAGNOSIS — M2550 Pain in unspecified joint: Secondary | ICD-10-CM

## 2012-10-07 DIAGNOSIS — K8689 Other specified diseases of pancreas: Secondary | ICD-10-CM | POA: Insufficient documentation

## 2012-10-07 DIAGNOSIS — R1900 Intra-abdominal and pelvic swelling, mass and lump, unspecified site: Secondary | ICD-10-CM | POA: Diagnosis present

## 2012-10-07 DIAGNOSIS — Z880 Allergy status to penicillin: Secondary | ICD-10-CM

## 2012-10-07 DIAGNOSIS — K869 Disease of pancreas, unspecified: Secondary | ICD-10-CM

## 2012-10-07 DIAGNOSIS — M069 Rheumatoid arthritis, unspecified: Secondary | ICD-10-CM | POA: Diagnosis present

## 2012-10-07 DIAGNOSIS — F17200 Nicotine dependence, unspecified, uncomplicated: Secondary | ICD-10-CM | POA: Diagnosis present

## 2012-10-07 DIAGNOSIS — M130 Polyarthritis, unspecified: Secondary | ICD-10-CM | POA: Diagnosis present

## 2012-10-07 DIAGNOSIS — R109 Unspecified abdominal pain: Secondary | ICD-10-CM

## 2012-10-07 DIAGNOSIS — K225 Diverticulum of esophagus, acquired: Secondary | ICD-10-CM | POA: Diagnosis present

## 2012-10-07 DIAGNOSIS — J45909 Unspecified asthma, uncomplicated: Secondary | ICD-10-CM | POA: Diagnosis present

## 2012-10-07 DIAGNOSIS — K449 Diaphragmatic hernia without obstruction or gangrene: Principal | ICD-10-CM | POA: Diagnosis present

## 2012-10-07 DIAGNOSIS — M25562 Pain in left knee: Secondary | ICD-10-CM

## 2012-10-07 DIAGNOSIS — R140 Abdominal distension (gaseous): Secondary | ICD-10-CM | POA: Diagnosis present

## 2012-10-07 DIAGNOSIS — Z79899 Other long term (current) drug therapy: Secondary | ICD-10-CM

## 2012-10-07 DIAGNOSIS — M25569 Pain in unspecified knee: Secondary | ICD-10-CM | POA: Diagnosis present

## 2012-10-07 LAB — CBC
MCH: 29.5 pg (ref 26.0–34.0)
MCV: 85.9 fL (ref 78.0–100.0)
Platelets: 333 10*3/uL (ref 150–400)
RDW: 13.1 % (ref 11.5–15.5)

## 2012-10-07 LAB — RAPID STREP SCREEN (MED CTR MEBANE ONLY): Streptococcus, Group A Screen (Direct): NEGATIVE

## 2012-10-07 LAB — COMPREHENSIVE METABOLIC PANEL
AST: 16 U/L (ref 0–37)
CO2: 22 mEq/L (ref 19–32)
Calcium: 8.9 mg/dL (ref 8.4–10.5)
Creatinine, Ser: 1.02 mg/dL (ref 0.50–1.10)
GFR calc non Af Amer: 59 mL/min — ABNORMAL LOW (ref >90)

## 2012-10-07 LAB — URINALYSIS, ROUTINE W REFLEX MICROSCOPIC
Bilirubin Urine: NEGATIVE
Glucose, UA: NEGATIVE mg/dL
Hgb urine dipstick: NEGATIVE
Ketones, ur: NEGATIVE mg/dL
Nitrite: NEGATIVE
pH: 6 (ref 5.0–8.0)

## 2012-10-07 LAB — LIPASE, BLOOD: Lipase: 16 U/L (ref 11–59)

## 2012-10-07 MED ORDER — ALBUTEROL SULFATE HFA 108 (90 BASE) MCG/ACT IN AERS
2.0000 | INHALATION_SPRAY | Freq: Two times a day (BID) | RESPIRATORY_TRACT | Status: DC
  Administered 2012-10-08 – 2012-10-11 (×6): 2 via RESPIRATORY_TRACT
  Filled 2012-10-07: qty 6.7

## 2012-10-07 MED ORDER — LISINOPRIL-HYDROCHLOROTHIAZIDE 20-12.5 MG PO TABS: 1.0000 | ORAL_TABLET | Freq: Every day | ORAL | Status: DC

## 2012-10-07 MED ORDER — HYDROCHLOROTHIAZIDE 12.5 MG PO CAPS
12.5000 mg | ORAL_CAPSULE | Freq: Every day | ORAL | Status: DC
  Administered 2012-10-07 – 2012-10-08 (×2): 12.5 mg via ORAL
  Filled 2012-10-07 (×2): qty 1

## 2012-10-07 MED ORDER — ONDANSETRON HCL 4 MG/2ML IJ SOLN
4.0000 mg | Freq: Once | INTRAMUSCULAR | Status: AC
  Administered 2012-10-07: 4 mg via INTRAVENOUS
  Filled 2012-10-07: qty 2

## 2012-10-07 MED ORDER — PANTOPRAZOLE SODIUM 40 MG PO TBEC
40.0000 mg | DELAYED_RELEASE_TABLET | Freq: Every day | ORAL | Status: DC
  Administered 2012-10-07 – 2012-10-11 (×5): 40 mg via ORAL
  Filled 2012-10-07 (×5): qty 1

## 2012-10-07 MED ORDER — HYDROMORPHONE HCL PF 1 MG/ML IJ SOLN
1.0000 mg | INTRAMUSCULAR | Status: DC | PRN
  Administered 2012-10-08 – 2012-10-11 (×12): 1 mg via INTRAVENOUS
  Filled 2012-10-07 (×12): qty 1

## 2012-10-07 MED ORDER — HYDROMORPHONE HCL PF 1 MG/ML IJ SOLN
1.0000 mg | Freq: Once | INTRAMUSCULAR | Status: AC
  Administered 2012-10-07: 1 mg via INTRAVENOUS
  Filled 2012-10-07: qty 1

## 2012-10-07 MED ORDER — HEPARIN SODIUM (PORCINE) 5000 UNIT/ML IJ SOLN
5000.0000 [IU] | Freq: Three times a day (TID) | INTRAMUSCULAR | Status: DC
  Administered 2012-10-07 – 2012-10-11 (×11): 5000 [IU] via SUBCUTANEOUS
  Filled 2012-10-07 (×14): qty 1

## 2012-10-07 MED ORDER — SODIUM CHLORIDE 0.9 % IV BOLUS (SEPSIS)
1000.0000 mL | Freq: Once | INTRAVENOUS | Status: AC
  Administered 2012-10-07: 1000 mL via INTRAVENOUS

## 2012-10-07 MED ORDER — SODIUM CHLORIDE 0.9 % IV SOLN
INTRAVENOUS | Status: DC
  Administered 2012-10-07: 20 mL/h via INTRAVENOUS
  Administered 2012-10-07: 10 mL/h via INTRAVENOUS

## 2012-10-07 MED ORDER — LORATADINE 10 MG PO TABS
10.0000 mg | ORAL_TABLET | Freq: Every day | ORAL | Status: DC | PRN
  Filled 2012-10-07: qty 1

## 2012-10-07 MED ORDER — LISINOPRIL 20 MG PO TABS
20.0000 mg | ORAL_TABLET | Freq: Every day | ORAL | Status: DC
  Administered 2012-10-07 – 2012-10-08 (×2): 20 mg via ORAL
  Filled 2012-10-07 (×2): qty 1

## 2012-10-07 MED ORDER — METHOCARBAMOL 500 MG PO TABS
500.0000 mg | ORAL_TABLET | Freq: Three times a day (TID) | ORAL | Status: DC
  Administered 2012-10-07 – 2012-10-11 (×11): 500 mg via ORAL
  Filled 2012-10-07 (×14): qty 1

## 2012-10-07 MED ORDER — PANTOPRAZOLE SODIUM 40 MG IV SOLR
40.0000 mg | Freq: Once | INTRAVENOUS | Status: AC
  Administered 2012-10-07: 40 mg via INTRAVENOUS
  Filled 2012-10-07: qty 40

## 2012-10-07 MED ORDER — GI COCKTAIL ~~LOC~~
30.0000 mL | Freq: Three times a day (TID) | ORAL | Status: DC | PRN
  Administered 2012-10-07 – 2012-10-08 (×2): 30 mL via ORAL
  Filled 2012-10-07 (×2): qty 30
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
## 2012-10-07 NOTE — ED Notes (Addendum)
Pt c/o generalized pain d/t her RA.  States she's been taking a lot of Aleve b/c "I don't got no pain pills" and feels her belly pain is d/t taking Aleve.  States she takes 5 Aleve at a time Q8 hours for the last month. States at times she has had bright red blood in stools and has blood tinged sputum when she spits.   Also c/o pain to RT toe, stating she thinks she broke it.

## 2012-10-07 NOTE — ED Notes (Signed)
Documented Iohexol allergy in chart (throat swelling), will need to review CT Abd/Pelvis with contrast order with MD.

## 2012-10-07 NOTE — ED Notes (Signed)
Report called to Dwana Curd, RN 5east
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
## 2012-10-07 NOTE — Progress Notes (Signed)
Pt arrived to floor room 1501 via stretcher. VS taken, pt oriented to room with no complications. Initial assessment completed.

## 2012-10-07 NOTE — ED Provider Notes (Signed)
Pt signed out to me by dr. Denton Lank, abd ct findings noted, pt seen by general surgery and mediciane admit recc, pt to be admitted by triad--pt informed of results by me  Toy Baker, MD 10/07/12 [phone]

## 2012-10-08 ENCOUNTER — Inpatient Hospital Stay (HOSPITAL_COMMUNITY): Payer: Medicaid Other

## 2012-10-08 ENCOUNTER — Encounter (HOSPITAL_COMMUNITY): Payer: Self-pay | Admitting: Unknown Physician Specialty

## 2012-10-08 DIAGNOSIS — I10 Essential (primary) hypertension: Secondary | ICD-10-CM | POA: Diagnosis present

## 2012-10-08 DIAGNOSIS — M25569 Pain in unspecified knee: Secondary | ICD-10-CM | POA: Diagnosis present

## 2012-10-08 DIAGNOSIS — R1900 Intra-abdominal and pelvic swelling, mass and lump, unspecified site: Secondary | ICD-10-CM | POA: Diagnosis present

## 2012-10-08 LAB — CBC
HCT: 33.7 % — ABNORMAL LOW (ref 36.0–46.0)
Hemoglobin: 11.5 g/dL — ABNORMAL LOW (ref 12.0–15.0)
MCHC: 34.1 g/dL (ref 30.0–36.0)
RBC: 3.89 MIL/uL (ref 3.87–5.11)
WBC: 3.8 10*3/uL — ABNORMAL LOW (ref 4.0–10.5)

## 2012-10-08 LAB — BASIC METABOLIC PANEL
BUN: 10 mg/dL (ref 6–23)
Chloride: 103 mEq/L (ref 96–112)
GFR calc non Af Amer: 54 mL/min — ABNORMAL LOW (ref >90)
Glucose, Bld: 102 mg/dL — ABNORMAL HIGH (ref 70–99)
Potassium: 3.9 mEq/L (ref 3.5–5.1)
Sodium: 135 mEq/L (ref 135–145)

## 2012-10-08 LAB — URIC ACID: Uric Acid, Serum: 3.8 mg/dL (ref 2.4–7.0)

## 2012-10-08 MED ORDER — HYDRALAZINE HCL 20 MG/ML IJ SOLN: 5.0000 mg | Freq: Four times a day (QID) | INTRAMUSCULAR | Status: DC | PRN

## 2012-10-08 MED ORDER — SODIUM CHLORIDE 0.45 % IV SOLN
INTRAVENOUS | Status: DC
  Administered 2012-10-08 – 2012-10-09 (×3): via INTRAVENOUS
  Administered 2012-10-10: 980 mL via INTRAVENOUS
  Administered 2012-10-11: 05:00:00 via INTRAVENOUS

## 2012-10-08 NOTE — Progress Notes (Signed)
Patient does not take inhaler properly. I instructed her to use the spacer and she still did poorly.
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
## 2012-10-09 ENCOUNTER — Inpatient Hospital Stay (HOSPITAL_COMMUNITY): Payer: Medicaid Other

## 2012-10-09 DIAGNOSIS — M2550 Pain in unspecified joint: Secondary | ICD-10-CM

## 2012-10-09 DIAGNOSIS — M25529 Pain in unspecified elbow: Secondary | ICD-10-CM

## 2012-10-09 LAB — CBC
Hemoglobin: 12 g/dL (ref 12.0–15.0)
MCH: 29.3 pg (ref 26.0–34.0)
MCV: 86.6 fL (ref 78.0–100.0)
Platelets: 297 10*3/uL (ref 150–400)
RBC: 4.1 MIL/uL (ref 3.87–5.11)
WBC: 4.4 10*3/uL (ref 4.0–10.5)

## 2012-10-09 LAB — COMPREHENSIVE METABOLIC PANEL
ALT: 20 U/L (ref 0–35)
AST: 22 U/L (ref 0–37)
Albumin: 2.6 g/dL — ABNORMAL LOW (ref 3.5–5.2)
CO2: 26 mEq/L (ref 19–32)
Chloride: 99 mEq/L (ref 96–112)
Creatinine, Ser: 1.04 mg/dL (ref 0.50–1.10)
GFR calc Af Amer: 67 mL/min — ABNORMAL LOW (ref >90)
GFR calc non Af Amer: 58 mL/min — ABNORMAL LOW (ref >90)
Glucose, Bld: 101 mg/dL — ABNORMAL HIGH (ref 70–99)
Potassium: 3.8 mEq/L (ref 3.5–5.1)
Sodium: 134 mEq/L — ABNORMAL LOW (ref 135–145)
Total Bilirubin: 0.2 mg/dL — ABNORMAL LOW (ref 0.3–1.2)

## 2012-10-09 LAB — CULTURE, GROUP A STREP

## 2012-10-09 MED ORDER — ENSURE COMPLETE PO LIQD
237.0000 mL | Freq: Two times a day (BID) | ORAL | Status: DC
  Administered 2012-10-11: 237 mL via ORAL

## 2012-10-09 MED ORDER — GADOBENATE DIMEGLUMINE 529 MG/ML IV SOLN
15.0000 mL | Freq: Once | INTRAVENOUS | Status: AC | PRN
  Administered 2012-10-09: 15 mL via INTRAVENOUS
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
## 2012-10-09 NOTE — Progress Notes (Signed)
INITIAL NUTRITION ASSESSMENT  DOCUMENTATION CODES Per approved criteria  -Not Applicable   INTERVENTION: - Ensure Complete BID - Will continue to monitor   NUTRITION DIAGNOSIS: Inadequate oral intake related to ongoing poor appetite as evidenced by 25% meal intake.   Goal: Pt to consume >90% of meals/supplements  Monitor:  Weights, labs, intake, nausea, abdominal pain  Reason for Assessment: Nutrition risk   57 y.o. female  Admitting Dx: Pancreatic mass  ASSESSMENT: Pt admitted with abdominal pain x 2-3 days, found to have possible pancreatic CA versus duodenal mass. Pt asleep during visit, unable to obtain diet history. Per MD notes, pt with ongoing upper abdominal pain and had some nausea with eating today. Being followed by GI who noted pt with poor appetite and weight loss PTA but pt was unsure how much.   Height: Ht Readings from Last 1 Encounters:  10/07/12 5\' 7"  (1.702 m)    Weight: Wt Readings from Last 1 Encounters:  10/07/12 165 lb 9.6 oz (75.116 kg)    Ideal Body Weight: 135 lb   % Ideal Body Weight: 122%  Wt Readings from Last 10 Encounters:  10/07/12 165 lb 9.6 oz (75.116 kg)    Usual Body Weight: Unsure   BMI:  Body mass index is 25.93 kg/(m^2).  Estimated Nutritional Needs: Kcal: 1700-1900 Protein: 75-85g Fluid: 1.7-1.9L/day  Skin: Intact   Diet Order: Full Liquid  EDUCATION NEEDS: -No education needs identified at this time   Intake/Output Summary (Last 24 hours) at 10/09/12 1607 Last data filed at 10/09/12 1300  Gross per 24 hour  Intake 1376.23 ml  Output    400 ml  Net 976.23 ml    Last BM: 9/26  Labs:   Recent Labs Lab 10/07/12 1320 10/08/12 0530 10/09/12 0510  NA 133* 135 134*  K 4.0 3.9 3.8  CL 99 103 99  CO2 22 24 26   BUN 17 10 10   CREATININE 1.02 1.11* 1.04  CALCIUM 8.9 8.6 8.9  GLUCOSE 92 102* 101*    CBG (last 3)  No results found for this basename: GLUCAP,  in the last 72 hours  Scheduled  Meds: . albuterol  2 puff Inhalation BID  . heparin  5,000 Units Subcutaneous Q8H  . methocarbamol  500 mg Oral TID  . pantoprazole  40 mg Oral Daily    Continuous Infusions: . sodium chloride 75 mL/hr at 10/09/12 0557    Past Medical History  Diagnosis Date  . Active smoker   . Rheumatoid arthritis 1  . Hypertension     History reviewed. No pertinent past surgical history.  Levon Hedger MS, RD, LDN [phone] Pager [phone] After Hours Pager

## 2012-10-10 ENCOUNTER — Encounter (HOSPITAL_COMMUNITY): Payer: Self-pay | Admitting: *Deleted

## 2012-10-10 ENCOUNTER — Encounter (HOSPITAL_COMMUNITY)
Admission: EM | Disposition: A | Discharge status: Home / Self Care | Payer: Self-pay | Source: Home / Self Care | Attending: Internal Medicine

## 2012-10-10 HISTORY — PX: ESOPHAGOGASTRODUODENOSCOPY: SHX5428

## 2012-10-10 SURGERY — EGD (ESOPHAGOGASTRODUODENOSCOPY)
Anesthesia: Moderate Sedation

## 2012-10-10 MED ORDER — MIDAZOLAM HCL 10 MG/2ML IJ SOLN
INTRAMUSCULAR | Status: AC
  Filled 2012-10-10: qty 2

## 2012-10-10 MED ORDER — MIDAZOLAM HCL 10 MG/2ML IJ SOLN
INTRAMUSCULAR | Status: DC | PRN
  Administered 2012-10-10: 2 mg via INTRAVENOUS
  Administered 2012-10-10: 1 mg via INTRAVENOUS
  Administered 2012-10-10: 2 mg via INTRAVENOUS

## 2012-10-10 MED ORDER — FENTANYL CITRATE 0.05 MG/ML IJ SOLN
INTRAMUSCULAR | Status: AC
  Filled 2012-10-10: qty 2

## 2012-10-10 MED ORDER — BUTAMBEN-TETRACAINE-BENZOCAINE 2-2-14 % EX AERO
INHALATION_SPRAY | CUTANEOUS | Status: DC | PRN
  Administered 2012-10-10: 2 via TOPICAL

## 2012-10-10 MED ORDER — SUCRALFATE 1 G PO TABS
1.0000 g | ORAL_TABLET | Freq: Three times a day (TID) | ORAL | Status: DC
  Administered 2012-10-10 – 2012-10-11 (×3): 1 g via ORAL
  Filled 2012-10-10 (×7): qty 1

## 2012-10-10 MED ORDER — OXYCODONE HCL 5 MG PO TABS
5.0000 mg | ORAL_TABLET | ORAL | Status: DC | PRN
  Administered 2012-10-10 – 2012-10-11 (×4): 10 mg via ORAL
  Filled 2012-10-10 (×4): qty 2

## 2012-10-10 MED ORDER — DIPHENHYDRAMINE HCL 50 MG/ML IJ SOLN
INTRAMUSCULAR | Status: AC
  Filled 2012-10-10: qty 1

## 2012-10-10 MED ORDER — FENTANYL CITRATE 0.05 MG/ML IJ SOLN
INTRAMUSCULAR | Status: DC | PRN
  Administered 2012-10-10 (×2): 25 ug via INTRAVENOUS

## 2012-10-10 NOTE — Progress Notes (Signed)
[facility] PROGRESS NOTE  Makynlie Rossini NWG:[id number] DOB: 1955/03/14 DOA: 10/07/2012  PCP: Dorrene German, MD  Brief HPI: [name] is a 57 y.o. female who presented to the ED with 2-3 day h/o abdominal pain. Pain has been located in the epigastric area and LLQ, constant, dull, cramping. The epigastric pain seems to radiate to her back. She thought it might be acid reflux. Has had intermittent N/V. Aleve provides some relief. Unfortunately work up in the ED was remarkable for a mass which appears to involve the head of the pancreas and/or duodenum, also is immediately adjacent to the portal vein and the superior mesenteric vein.   Past medical history:  Past Medical History  Diagnosis Date  . Active smoker   . Rheumatoid arthritis 1  . Hypertension    Consultants: GI: Dr. Nicholes Mango  Procedures: None  Antibiotics: None  Subjective: Patient continues to have pain in upper abdomen partially relieved by narcotics. Had vomiting yesterday.   Objective: Vital Signs  Filed Vitals:   10/09/12 1936 10/09/12 2200 10/10/12 0600 10/10/12 0757  BP:  120/70 109/65   Pulse:  66 73   Temp:  98.4 F (36.9 C) 98.1 F (36.7 C)   TempSrc:  Oral Oral   Resp:  18 18   Height:      Weight:      SpO2: 94% 99% 98% 95%    Intake/Output Summary (Last 24 hours) at 10/10/12 1023 Last data filed at 10/10/12 5784  Gross per 24 hour  Intake 3387.92 ml  Output   1300 ml  Net 2087.92 ml   Filed Weights   10/07/12 2100  Weight: 75.116 kg (165 lb 9.6 oz)    General appearance: alert, cooperative, appears stated age and no distress Resp: clear to auscultation bilaterally Cardio: regular rate and rhythm, S1, S2 normal, no murmur, click, rub or gallop GI: Soft, tender in epigastrium without rebound, BS present, no masses or organomegaly but fullness in upper abdomen. Extremities: Right Elbow: restricted ROM. No obvious lesion noted. Swollen knees bilaterally as before. No  warmth/erythema. Neurologic: No focal deficits.  Lab Results:  Basic Metabolic Panel:  Recent Labs Lab 10/07/12 1320 10/08/12 0530 10/09/12 0510  NA 133* 135 134*  K 4.0 3.9 3.8  CL 99 103 99  CO2 22 24 26   GLUCOSE 92 102* 101*  BUN 17 10 10   CREATININE 1.02 1.11* 1.04  CALCIUM 8.9 8.6 8.9   Liver Function Tests:  Recent Labs Lab 10/07/12 1320 10/09/12 0510  AST 16 22  ALT 8 20  ALKPHOS 92 102  BILITOT 0.5 0.2*  PROT 7.1 6.4  ALBUMIN 3.0* 2.6*    Recent Labs Lab 10/07/12 1320  LIPASE 16   CBC:  Recent Labs Lab 10/07/12 1320 10/08/12 0530 10/09/12 0510  WBC 4.5 3.8* 4.4  HGB 13.2 11.5* 12.0  HCT 38.5 33.7* 35.5*  MCV 85.9 86.6 86.6  PLT 333 285 297    Recent Results (from the past 240 hour(s))  RAPID STREP SCREEN     Status: None   Collection Time    10/07/12  1:42 PM      Result Value Range Status   Streptococcus, Group A Screen (Direct) NEGATIVE  NEGATIVE Final   Comment: (NOTE)     A Rapid Antigen test may result negative if the antigen level in the     sample is below the detection level of this test. The FDA has not     cleared this test as  a stand-alone test therefore the rapid antigen     negative result has reflexed to a Group A Strep culture.  CULTURE, GROUP A STREP     Status: None   Collection Time    10/07/12  1:42 PM      Result Value Range Status   Specimen Description THROAT   Final   Special Requests NONE   Final   Culture     Final   Value: No Beta Hemolytic Streptococci Isolated     Performed at [facility]   Report Status 10/09/2012 FINAL   Final      Studies/Results: Dg Elbow 2 Views Right  10/09/2012   CLINICAL DATA:  Pain, patient states a foreign body may be present  EXAM: RIGHT ELBOW - 2 VIEW  COMPARISON:  None  FINDINGS: Osseous mineralization normal for technique.  Joint spaces preserved.  No acute fracture, dislocation or bone destruction.  No elbow joint effusion.  IMPRESSION: No acute abnormalities.    Electronically Signed   By: Ulyses Southward M.D.   On: 10/09/2012 13:22   Dg Knee 1-2 Views Left  10/08/2012   *RADIOLOGY REPORT*  Clinical Data: Pain, swelling  LEFT KNEE - 1-2 VIEW  Comparison: Prior knee radiographs 07/01/2012  Findings: Stable appearance of moderately advanced tricompartmental osteoarthritis most severe in the lateral compartment.  No acute fracture or malalignment.  Persistent and slightly enlarged suprapatellar joint effusion.  No acute fracture or malalignment. No lytic or blastic osseous lesion.  IMPRESSION:  1.  Persistent and slightly enlarged suprapatellar joint effusion is likely degenerative in etiology. 2.  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   Original Report Authenticated By: Malachy Moan, M.D.    Medications:  Scheduled: . albuterol  2 puff Inhalation BID  . feeding supplement  237 mL Oral BID BM  . heparin  5,000 Units Subcutaneous Q8H  . methocarbamol  500 mg Oral TID  . pantoprazole  40 mg Oral Daily   Continuous: . sodium chloride 75 mL/hr at 10/09/12 2133   PRN:gi cocktail, hydrALAZINE, HYDROmorphone (DILAUDID) injection, loratadine  Assessment/Plan:  Principal Problem:   Pancreatic mass Active Problems:   Abdominal pain   Abdominal mass   Knee pain   HTN (hypertension), benign    Abdominal Mass involving Pancrease or Duodenum Concerning for cancer. MRI/MRCP report pending. EUS planned for today. GI is following. Continue full liquids for now. Pain control. Continue PPI. Will need better pain control and able to tolerate orally. Add oral short acting narcotics. Consider LA narcotics once more information is available after EUS/MRI.  Bil Knee pain Likely arthritis based on xray. She has a history of RA. Uric acid was normal. Continue analgesics.   Right Elbow Pain No acute findings on xray. Probably related to arthritis.  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 May benefit  from steroids but will hold off till plan for pancreatic mass is outlined.  History of Asthma  Stable. No wheezing  History of HTN BP is well controlled. Holding ACEI and HCTZ as creatinine was slightly high. Creatinine is normal today.  Code Status:  Full Code DVT Prophylaxis:   Heparin Family Communication: Discussed with patient  Disposition Plan: Not ready for discharge    LOS: 3 days   [facility]  [facility] Pager [phone] 10/10/2012, 10:23 AM  If 8PM-8AM, please contact night-coverage at [email or web address], password [facility]
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
## 2012-10-10 NOTE — Op Note (Signed)
[facility] [address] [place], [address]   OPERATIVE PROCEDURE REPORT  PATIENT: [name], [name]  MR#: [id number] BIRTHDATE: 07-29-1955  GENDER: Female ENDOSCOPIST: Jeani Hawking, MD ASSISTANT:   [name], technician and Jamal Maes, RN PROCEDURE DATE: 10/10/2012 PROCEDURE:   EGD, diagnostic ASA CLASS:   Class III INDICATIONS:Epigastric and LUQ pain. MEDICATIONS: Versed 5 mg IV and Fentanyl 50 mcg IV TOPICAL ANESTHETIC:   Cetacaine Spray  DESCRIPTION OF PROCEDURE:   After the risks benefits and alternatives of the procedure were thoroughly explained, informed consent was obtained.  The PENTAX GASTOROSCOPE [id number]  endoscope was introduced through the mouth  and advanced to the second portion of the duodenum Without limitations.      The instrument was slowly withdrawn as the mucosa was fully examined.      FINDINGS: In the distal esophagus a small epiphrenic diverticulum was noted.  A 4 cm hiatal hernia was found.  No evidence of esophagitis or and esopahgeal stricture.  No other abnormalities were found in the upper GI tract, i.e., no ulcer, erosions, or vascular abnormalities.  Additionally, close evaluation of the second portion of the duodenum was normal.  No mass to correlate with the CT scan results.          The scope was then withdrawn from the patient and the procedure terminated.  COMPLICATIONS: There were no complications. IMPRESSION: 1) Epiphrenic diverticulum. 2)  Four cm hiatal hernia.  RECOMMENDATIONS: 1) Continue with Protonix. 2) Trial of sucralfate.  _______________________________ Rosalie DoctorJeani Hawking, MD 10/10/2012 2:03 PM

## 2012-10-10 NOTE — Interval H&P Note (Signed)
History and Physical Interval Note:  10/10/2012 1:36 PM  [name]  has presented today for surgery, with the diagnosis of ?Pancreatic mass  The various methods of treatment have been discussed with the patient and family. After consideration of risks, benefits and other options for treatment, the patient has consented to  Procedure(s): ESOPHAGOGASTRODUODENOSCOPY (EGD) (N/A) as a surgical intervention .  The patient's history has been reviewed, patient examined, no change in status, stable for surgery.  I have reviewed the patient's chart and labs.  Questions were answered to the patient's satisfaction.     Kimble Delaurentis D

## 2012-10-10 NOTE — Progress Notes (Signed)
I discussed the reading of the CT scan and MRI with Dr. Bradly Chris.  There is a discrepancy between the two scans.  No mass is identified on the MRI and there is no evidence of any ductal abnormalities.  I changed the procedure from an EUS to EGD as she does have epigastric pain with radiation to her back.

## 2012-10-11 ENCOUNTER — Encounter (HOSPITAL_COMMUNITY): Payer: Self-pay | Admitting: Gastroenterology

## 2012-10-11 DIAGNOSIS — K219 Gastro-esophageal reflux disease without esophagitis: Secondary | ICD-10-CM

## 2012-10-11 MED ORDER — SUCRALFATE 1 G PO TABS: 1.0000 g | ORAL_TABLET | Freq: Three times a day (TID) | ORAL | Status: DC

## 2012-10-11 MED ORDER — PANTOPRAZOLE SODIUM 40 MG PO TBEC: 40.0000 mg | DELAYED_RELEASE_TABLET | Freq: Every day | ORAL | Status: DC

## 2012-10-11 MED ORDER — NICOTINE 21 MG/24HR TD PT24: 1.0000 | MEDICATED_PATCH | TRANSDERMAL | Status: DC

## 2012-10-11 MED ORDER — PREDNISONE 20 MG PO TABS
20.0000 mg | ORAL_TABLET | Freq: Every day | ORAL | Status: DC
  Filled 2012-10-11: qty 1

## 2012-10-11 MED ORDER — OXYCODONE-ACETAMINOPHEN 5-325 MG PO TABS: 1.0000 | ORAL_TABLET | ORAL | Status: DC | PRN

## 2012-10-11 MED ORDER — PREDNISONE 20 MG PO TABS: 20.0000 mg | ORAL_TABLET | Freq: Every day | ORAL | Status: DC
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
## 2012-10-19 ENCOUNTER — Other Ambulatory Visit: Payer: Self-pay | Admitting: Internal Medicine

## 2012-10-19 DIAGNOSIS — Z1231 Encounter for screening mammogram for malignant neoplasm of breast: Secondary | ICD-10-CM

## 2012-10-26 ENCOUNTER — Encounter (HOSPITAL_COMMUNITY): Payer: Self-pay | Admitting: Emergency Medicine

## 2012-10-26 ENCOUNTER — Emergency Department (HOSPITAL_COMMUNITY)
Admission: EM | Admit: 2012-10-26 | Discharge: 2012-10-26 | Disposition: A | Discharge status: Home / Self Care | Payer: Medicaid Other | Attending: Emergency Medicine | Admitting: Emergency Medicine

## 2012-10-26 ENCOUNTER — Emergency Department (HOSPITAL_COMMUNITY): Payer: Medicaid Other

## 2012-10-26 DIAGNOSIS — R1084 Generalized abdominal pain: Secondary | ICD-10-CM | POA: Insufficient documentation

## 2012-10-26 DIAGNOSIS — I10 Essential (primary) hypertension: Secondary | ICD-10-CM | POA: Insufficient documentation

## 2012-10-26 DIAGNOSIS — Z3202 Encounter for pregnancy test, result negative: Secondary | ICD-10-CM | POA: Insufficient documentation

## 2012-10-26 DIAGNOSIS — R109 Unspecified abdominal pain: Secondary | ICD-10-CM

## 2012-10-26 DIAGNOSIS — F17200 Nicotine dependence, unspecified, uncomplicated: Secondary | ICD-10-CM | POA: Insufficient documentation

## 2012-10-26 DIAGNOSIS — Z79899 Other long term (current) drug therapy: Secondary | ICD-10-CM | POA: Insufficient documentation

## 2012-10-26 DIAGNOSIS — R1110 Vomiting, unspecified: Secondary | ICD-10-CM | POA: Insufficient documentation

## 2012-10-26 DIAGNOSIS — IMO0002 Reserved for concepts with insufficient information to code with codable children: Secondary | ICD-10-CM | POA: Insufficient documentation

## 2012-10-26 DIAGNOSIS — Z9889 Other specified postprocedural states: Secondary | ICD-10-CM | POA: Insufficient documentation

## 2012-10-26 DIAGNOSIS — Z880 Allergy status to penicillin: Secondary | ICD-10-CM | POA: Insufficient documentation

## 2012-10-26 DIAGNOSIS — K219 Gastro-esophageal reflux disease without esophagitis: Secondary | ICD-10-CM | POA: Insufficient documentation

## 2012-10-26 LAB — CBC WITH DIFFERENTIAL/PLATELET
Basophils Absolute: 0 10*3/uL (ref 0.0–0.1)
Basophils Relative: 0 % (ref 0–1)
Eosinophils Absolute: 0 10*3/uL (ref 0.0–0.7)
HCT: 34.9 % — ABNORMAL LOW (ref 36.0–46.0)
Hemoglobin: 11.8 g/dL — ABNORMAL LOW (ref 12.0–15.0)
Lymphocytes Relative: 15 % (ref 12–46)
MCH: 28.8 pg (ref 26.0–34.0)
MCHC: 33.8 g/dL (ref 30.0–36.0)
Monocytes Absolute: 0.3 10*3/uL (ref 0.1–1.0)
Monocytes Relative: 6 % (ref 3–12)
Neutro Abs: 4.7 10*3/uL (ref 1.7–7.7)
Neutrophils Relative %: 79 % — ABNORMAL HIGH (ref 43–77)
Platelets: 326 10*3/uL (ref 150–400)
RDW: 13.3 % (ref 11.5–15.5)

## 2012-10-26 LAB — PREGNANCY, URINE: Preg Test, Ur: NEGATIVE

## 2012-10-26 LAB — URINALYSIS, ROUTINE W REFLEX MICROSCOPIC
Ketones, ur: NEGATIVE mg/dL
Leukocytes, UA: NEGATIVE
Nitrite: NEGATIVE
Specific Gravity, Urine: 1.039 — ABNORMAL HIGH (ref 1.005–1.030)
pH: 6 (ref 5.0–8.0)

## 2012-10-26 LAB — URINE MICROSCOPIC-ADD ON

## 2012-10-26 LAB — COMPREHENSIVE METABOLIC PANEL
AST: 10 U/L (ref 0–37)
Albumin: 3 g/dL — ABNORMAL LOW (ref 3.5–5.2)
BUN: 20 mg/dL (ref 6–23)
CO2: 22 mEq/L (ref 19–32)
Calcium: 8.7 mg/dL (ref 8.4–10.5)
Creatinine, Ser: 1.05 mg/dL (ref 0.50–1.10)
Sodium: 140 mEq/L (ref 135–145)
Total Protein: 7.3 g/dL (ref 6.0–8.3)

## 2012-10-26 MED ORDER — HYDROMORPHONE HCL PF 1 MG/ML IJ SOLN
1.0000 mg | Freq: Once | INTRAMUSCULAR | Status: AC
  Administered 2012-10-26: 1 mg via INTRAVENOUS
  Filled 2012-10-26: qty 1

## 2012-10-26 MED ORDER — LORAZEPAM 2 MG/ML IJ SOLN
1.0000 mg | Freq: Once | INTRAMUSCULAR | Status: AC
  Administered 2012-10-26: 1 mg via INTRAVENOUS
  Filled 2012-10-26: qty 1

## 2012-10-26 MED ORDER — ONDANSETRON HCL 4 MG/2ML IJ SOLN
4.0000 mg | Freq: Once | INTRAMUSCULAR | Status: AC
  Administered 2012-10-26: 4 mg via INTRAVENOUS
  Filled 2012-10-26: qty 2

## 2012-10-26 MED ORDER — SODIUM CHLORIDE 0.9 % IV BOLUS (SEPSIS)
1000.0000 mL | Freq: Once | INTRAVENOUS | Status: AC
  Administered 2012-10-26: 1000 mL via INTRAVENOUS

## 2012-10-26 MED ORDER — SODIUM CHLORIDE 0.9 % IV SOLN
INTRAVENOUS | Status: DC
  Administered 2012-10-26: 06:00:00 via INTRAVENOUS

## 2012-10-26 NOTE — ED Provider Notes (Signed)
CSN: [id number]     Arrival date & time 10/26/12  7829 History   First MD Initiated Contact with Patient 10/26/12 0451     Chief Complaint  Patient presents with  . Abdominal Pain   (Consider location/radiation/quality/duration/timing/severity/associated sxs/prior Treatment) Patient is a 57 y.o. female presenting with abdominal pain. The history is provided by the patient.  Abdominal Pain  patient here complaining of 3 weeks of diffuse abdominal pain. Seen by myself for similar symptoms and was admitted for possible pancreatic mass. I reviewed her old records, and patient had MRI of her abdomen that was negative for mass of the pancreas. She did have a EGD performed which showed some reflux disease and this placed on a PPI as well as Carafate. Patient continues to note colicky lower abdominal pain without associated vaginal bleeding or discharge. No hematuria dysuria. Not associated with food. Symptoms were made better with Percocet. She has had vomiting x2 but denies any black or bloody stools.  Past Medical History  Diagnosis Date  . Active smoker   . Rheumatoid arthritis 1  . Hypertension    Past Surgical History  Procedure Laterality Date  . Esophagogastroduodenoscopy N/A 10/10/2012    Procedure: ESOPHAGOGASTRODUODENOSCOPY (EGD);  Surgeon: Theda Belfast, MD;  Location: [facility];  Service: Endoscopy;  Laterality: N/A;   No family history on file. History  Substance Use Topics  . Smoking status: Current Every Day Smoker -- 0.50 packs/day    Types: Cigarettes  . Smokeless tobacco: Not on file  . Alcohol Use: No   OB History   Grav Para Term Preterm Abortions TAB SAB Ect Mult Living                 Review of Systems  Gastrointestinal: Positive for abdominal pain.  All other systems reviewed and are negative.    Allergies  Iohexol and Penicillins  Home Medications   Current Outpatient Rx  Name  Route  Sig  Dispense  Refill  . lisinopril-hydrochlorothiazide  (PRINZIDE,ZESTORETIC) 20-12.5 MG per tablet   Oral   Take 1 tablet by mouth daily.         Marland Kitchen loratadine (CLARITIN) 10 MG tablet   Oral   Take 10 mg by mouth daily as needed for allergies.          . methocarbamol (ROBAXIN) 500 MG tablet   Oral   Take 500 mg by mouth 3 (three) times daily.         . pantoprazole (PROTONIX) 40 MG tablet   Oral   Take 1 tablet (40 mg total) by mouth daily.   30 tablet   0   . albuterol (PROVENTIL HFA;VENTOLIN HFA) 108 (90 BASE) MCG/ACT inhaler   Inhalation   Inhale 2 puffs into the lungs 2 (two) times daily.         Marland Kitchen oxyCODONE-acetaminophen (PERCOCET/ROXICET) 5-325 MG per tablet   Oral   Take 1 tablet by mouth every 4 (four) hours as needed for pain.   30 tablet   0   . predniSONE (DELTASONE) 20 MG tablet   Oral   Take 1 tablet (20 mg total) by mouth daily before breakfast.   7 tablet   0   . sucralfate (CARAFATE) 1 G tablet   Oral   Take 1 tablet (1 g total) by mouth 4 (four) times daily -  with meals and at bedtime.   90 tablet   0    BP 150/78  Pulse 96  Temp(Src)  98.9 F (37.2 C) (Oral)  Resp 20  Ht 5\' 6"  (1.676 m)  Wt 158 lb (71.668 kg)  BMI 25.51 kg/m2  SpO2 96% Physical Exam  Nursing note and vitals reviewed. Constitutional: She is oriented to person, place, and time. She appears well-developed and well-nourished.  Non-toxic appearance. No distress.  HENT:  Head: Normocephalic and atraumatic.  Eyes: Conjunctivae, EOM and lids are normal. Pupils are equal, round, and reactive to light.  Neck: Normal range of motion. Neck supple. No tracheal deviation present. No mass present.  Cardiovascular: Normal rate, regular rhythm and normal heart sounds.  Exam reveals no gallop.   No murmur heard. Pulmonary/Chest: Effort normal and breath sounds normal. No stridor. No respiratory distress. She has no decreased breath sounds. She has no wheezes. She has no rhonchi. She has no rales.  Abdominal: Soft. Normal appearance and  bowel sounds are normal. She exhibits no distension. There is generalized tenderness. There is no rigidity, no rebound, no guarding and no CVA tenderness.  Musculoskeletal: Normal range of motion. She exhibits no edema and no tenderness.  Neurological: She is alert and oriented to person, place, and time. She has normal strength. No cranial nerve deficit or sensory deficit. GCS eye subscore is 4. GCS verbal subscore is 5. GCS motor subscore is 6.  Skin: Skin is warm and dry. No abrasion and no rash noted.  Psychiatric: She has a normal mood and affect. Her speech is normal and behavior is normal.    ED Course  Procedures (including critical care time) Labs Review Labs Reviewed  CBC WITH DIFFERENTIAL  COMPREHENSIVE METABOLIC PANEL  PREGNANCY, URINE  URINALYSIS, ROUTINE W REFLEX MICROSCOPIC   Imaging Review No results found.  EKG Interpretation   None       MDM  No diagnosis found.  Patient given medications for pain and feels better. She has no acute abdominal pathology at this time. Will follow with her Dr. as needed    Toy Baker, MD 10/26/12 [phone]

## 2012-10-26 NOTE — ED Notes (Signed)
Pt states that she was seen last week and was admitted for kidney problems; pt states that she began to have pain 2 days ago but that it has gotten worse over the last day; pt c/o swelling to extremities; pt c/o N/V

## 2012-10-30 ENCOUNTER — Ambulatory Visit: Payer: Medicaid Other

## 2012-12-24 ENCOUNTER — Encounter (HOSPITAL_COMMUNITY): Payer: Self-pay | Admitting: Emergency Medicine

## 2012-12-24 ENCOUNTER — Emergency Department (HOSPITAL_COMMUNITY)
Admission: EM | Admit: 2012-12-24 | Discharge: 2012-12-25 | Disposition: A | Discharge status: Home / Self Care | Payer: Medicaid Other | Attending: Emergency Medicine | Admitting: Emergency Medicine

## 2012-12-24 DIAGNOSIS — Z880 Allergy status to penicillin: Secondary | ICD-10-CM | POA: Insufficient documentation

## 2012-12-24 DIAGNOSIS — R1110 Vomiting, unspecified: Secondary | ICD-10-CM | POA: Insufficient documentation

## 2012-12-24 DIAGNOSIS — IMO0002 Reserved for concepts with insufficient information to code with codable children: Secondary | ICD-10-CM | POA: Insufficient documentation

## 2012-12-24 DIAGNOSIS — Z79899 Other long term (current) drug therapy: Secondary | ICD-10-CM | POA: Insufficient documentation

## 2012-12-24 DIAGNOSIS — K029 Dental caries, unspecified: Secondary | ICD-10-CM | POA: Insufficient documentation

## 2012-12-24 DIAGNOSIS — I10 Essential (primary) hypertension: Secondary | ICD-10-CM | POA: Insufficient documentation

## 2012-12-24 DIAGNOSIS — F17200 Nicotine dependence, unspecified, uncomplicated: Secondary | ICD-10-CM | POA: Insufficient documentation

## 2012-12-24 DIAGNOSIS — M069 Rheumatoid arthritis, unspecified: Secondary | ICD-10-CM | POA: Insufficient documentation

## 2012-12-24 DIAGNOSIS — K047 Periapical abscess without sinus: Secondary | ICD-10-CM

## 2012-12-24 MED ORDER — BUPIVACAINE-EPINEPHRINE PF 0.5-1:200000 % IJ SOLN
1.8000 mL | Freq: Once | INTRAMUSCULAR | Status: AC
  Administered 2012-12-25: 9 mg
  Filled 2012-12-24: qty 1.8

## 2012-12-24 NOTE — ED Provider Notes (Addendum)
CSN: [id number]     Arrival date & time 12/24/12  2215 History   First MD Initiated Contact with Patient 12/24/12 2306     Chief Complaint  Patient presents with  . Dental Pain  . Facial Swelling  . Emesis   (Consider location/radiation/quality/duration/timing/severity/associated sxs/prior Treatment) Patient is a 57 y.o. female presenting with tooth pain and vomiting. The history is provided by the patient.  Dental Pain Location:  Lower Lower teeth location:  30/RL 1st molar Quality:  Sharp and pulsating Severity:  Severe Onset quality:  Gradual Duration:  1 week Timing:  Constant Progression:  Worsening Chronicity:  New Context: abscess and dental fracture   Relieved by:  Nothing Worsened by:  Cold food/drink, hot food/drink, touching and jaw movement Ineffective treatments:  Acetaminophen, ice and heat Associated symptoms: facial pain and facial swelling   Associated symptoms: no congestion, no difficulty swallowing, no drooling, no oral bleeding and no oral lesions   Risk factors: periodontal disease and smoking   Emesis   Past Medical History  Diagnosis Date  . Active smoker   . Rheumatoid arthritis 1  . Hypertension    Past Surgical History  Procedure Laterality Date  . Esophagogastroduodenoscopy N/A 10/10/2012    Procedure: ESOPHAGOGASTRODUODENOSCOPY (EGD);  Surgeon: Theda Belfast, MD;  Location: [facility];  Service: Endoscopy;  Laterality: N/A;   No family history on file. History  Substance Use Topics  . Smoking status: Current Every Day Smoker -- 0.50 packs/day    Types: Cigarettes  . Smokeless tobacco: Not on file  . Alcohol Use: No   OB History   Grav Para Term Preterm Abortions TAB SAB Ect Mult Living                 Review of Systems  HENT: Positive for facial swelling. Negative for congestion, drooling and mouth sores.   Gastrointestinal: Positive for vomiting.  All other systems reviewed and are negative.    Allergies  Iohexol and  Penicillins  Home Medications   Current Outpatient Rx  Name  Route  Sig  Dispense  Refill  . ibuprofen (ADVIL,MOTRIN) 200 MG tablet   Oral   Take 800 mg by mouth every 8 (eight) hours as needed for headache or moderate pain.         Marland Kitchen lisinopril-hydrochlorothiazide (PRINZIDE,ZESTORETIC) 20-12.5 MG per tablet   Oral   Take 1 tablet by mouth every morning.          . predniSONE (DELTASONE) 20 MG tablet   Oral   Take 20 mg by mouth daily with breakfast.         . traMADol (ULTRAM) 50 MG tablet   Oral   Take 50 mg by mouth every 6 (six) hours as needed for moderate pain.          BP 133/90  Pulse 81  Temp(Src) 98.1 F (36.7 C) (Oral)  Resp 89  Ht 5\' 6"  (1.676 m)  Wt 155 lb (70.308 kg)  BMI 25.03 kg/m2  SpO2 100% Physical Exam  Nursing note and vitals reviewed. Constitutional: She is oriented to person, place, and time. She appears well-developed and well-nourished. No distress.  HENT:  Head: Normocephalic and atraumatic.    Mouth/Throat: Dental abscesses and dental caries present.    No swelling under the tongue  Eyes: EOM are normal. Pupils are equal, round, and reactive to light.  Cardiovascular: Normal rate.   Pulmonary/Chest: Effort normal.  Lymphadenopathy:    She has no cervical adenopathy.  Neurological: She is alert and oriented to person, place, and time.  Skin: Skin is warm and dry. No rash noted. No erythema.  Psychiatric: She has a normal mood and affect. Her behavior is normal.    ED Course  Procedures (including critical care time) Labs Review Labs Reviewed - No data to display Imaging Review No results found.  EKG Interpretation   None      INCISION AND DRAINAGE Performed by: Gwyneth Sprout Consent: Verbal consent obtained. Risks and benefits: risks, benefits and alternatives were discussed Type: abscess  Body area: dental  Anesthesia: local infiltration  Incision was made with a scalpel.  Local anesthetic: marcaine 5%  with epi  Anesthetic total: 1.8 ml  Complexity: simple Drainage: purulent  Drainage amount: 5mg   Packing material: none Patient tolerance: Patient tolerated the procedure well with no immediate complications.    MDM   1. Dental abscess     Pt with dental caries and facial swelling.  No signs of ludwig's angina or difficulty swallowing and no systemic symptoms.  I&D preformed. Will treat with clinda and have pt f/u with dentist.     Gwyneth Sprout, MD 12/25/12 1914  Gwyneth Sprout, MD 12/25/12 [phone]

## 2012-12-24 NOTE — ED Notes (Addendum)
Pt presents with severe pain. Facial swelling to rt side and dental pain x 1 week. Pt reports vomited x 4 today.

## 2012-12-25 MED ORDER — CLINDAMYCIN HCL 300 MG PO CAPS
300.0000 mg | ORAL_CAPSULE | Freq: Once | ORAL | Status: AC
Start: 2012-12-25 — End: 2012-12-25
  Administered 2012-12-25: 300 mg via ORAL
  Filled 2012-12-25: qty 1

## 2012-12-25 MED ORDER — CLINDAMYCIN HCL 150 MG PO CAPS: 150.0000 mg | ORAL_CAPSULE | Freq: Four times a day (QID) | ORAL | Status: DC

## 2012-12-25 MED ORDER — OXYCODONE-ACETAMINOPHEN 5-325 MG PO TABS: 1.0000 | ORAL_TABLET | Freq: Four times a day (QID) | ORAL | Status: DC | PRN

## 2012-12-25 MED ORDER — OXYCODONE-ACETAMINOPHEN 5-325 MG PO TABS
1.0000 | ORAL_TABLET | Freq: Once | ORAL | Status: AC
  Administered 2012-12-25: 1 via ORAL
  Filled 2012-12-25: qty 1

## 2013-01-24 ENCOUNTER — Ambulatory Visit
Admission: RE | Admit: 2013-01-24 | Discharge: 2013-01-24 | Disposition: A | Discharge status: Home / Self Care | Payer: Medicaid Other | Source: Ambulatory Visit | Attending: Internal Medicine | Admitting: Internal Medicine

## 2013-01-24 DIAGNOSIS — Z1231 Encounter for screening mammogram for malignant neoplasm of breast: Secondary | ICD-10-CM

## 2013-01-25 ENCOUNTER — Other Ambulatory Visit: Payer: Self-pay | Admitting: Internal Medicine

## 2013-01-25 DIAGNOSIS — R928 Other abnormal and inconclusive findings on diagnostic imaging of breast: Secondary | ICD-10-CM

## 2013-02-06 ENCOUNTER — Other Ambulatory Visit: Payer: Medicaid Other

## 2013-02-11 ENCOUNTER — Encounter (HOSPITAL_COMMUNITY): Payer: Self-pay | Admitting: *Deleted

## 2013-02-11 ENCOUNTER — Inpatient Hospital Stay (HOSPITAL_COMMUNITY)
Admission: EM | Admit: 2013-02-11 | Discharge: 2013-02-15 | DRG: 546 | Disposition: A | Discharge status: Home Health | Payer: Medicaid Other | Attending: Internal Medicine | Admitting: Internal Medicine

## 2013-02-11 DIAGNOSIS — M6580 Other synovitis and tenosynovitis, unspecified site: Secondary | ICD-10-CM | POA: Diagnosis present

## 2013-02-11 DIAGNOSIS — J45909 Unspecified asthma, uncomplicated: Secondary | ICD-10-CM | POA: Diagnosis present

## 2013-02-11 DIAGNOSIS — R52 Pain, unspecified: Secondary | ICD-10-CM | POA: Diagnosis present

## 2013-02-11 DIAGNOSIS — I10 Essential (primary) hypertension: Secondary | ICD-10-CM | POA: Diagnosis present

## 2013-02-11 DIAGNOSIS — T3995XA Adverse effect of unspecified nonopioid analgesic, antipyretic and antirheumatic, initial encounter: Secondary | ICD-10-CM | POA: Diagnosis present

## 2013-02-11 DIAGNOSIS — K449 Diaphragmatic hernia without obstruction or gangrene: Secondary | ICD-10-CM | POA: Diagnosis present

## 2013-02-11 DIAGNOSIS — I129 Hypertensive chronic kidney disease with stage 1 through stage 4 chronic kidney disease, or unspecified chronic kidney disease: Secondary | ICD-10-CM | POA: Diagnosis present

## 2013-02-11 DIAGNOSIS — R700 Elevated erythrocyte sedimentation rate: Secondary | ICD-10-CM | POA: Diagnosis present

## 2013-02-11 DIAGNOSIS — R109 Unspecified abdominal pain: Secondary | ICD-10-CM

## 2013-02-11 DIAGNOSIS — K8689 Other specified diseases of pancreas: Secondary | ICD-10-CM

## 2013-02-11 DIAGNOSIS — F1410 Cocaine abuse, uncomplicated: Secondary | ICD-10-CM | POA: Diagnosis present

## 2013-02-11 DIAGNOSIS — K219 Gastro-esophageal reflux disease without esophagitis: Secondary | ICD-10-CM | POA: Diagnosis present

## 2013-02-11 DIAGNOSIS — R509 Fever, unspecified: Secondary | ICD-10-CM | POA: Diagnosis not present

## 2013-02-11 DIAGNOSIS — N289 Disorder of kidney and ureter, unspecified: Secondary | ICD-10-CM

## 2013-02-11 DIAGNOSIS — Z91041 Radiographic dye allergy status: Secondary | ICD-10-CM

## 2013-02-11 DIAGNOSIS — N189 Chronic kidney disease, unspecified: Secondary | ICD-10-CM | POA: Diagnosis present

## 2013-02-11 DIAGNOSIS — M25569 Pain in unspecified knee: Secondary | ICD-10-CM

## 2013-02-11 DIAGNOSIS — R1900 Intra-abdominal and pelvic swelling, mass and lump, unspecified site: Secondary | ICD-10-CM

## 2013-02-11 DIAGNOSIS — Z79899 Other long term (current) drug therapy: Secondary | ICD-10-CM

## 2013-02-11 DIAGNOSIS — R5381 Other malaise: Secondary | ICD-10-CM | POA: Diagnosis present

## 2013-02-11 DIAGNOSIS — N179 Acute kidney failure, unspecified: Secondary | ICD-10-CM | POA: Diagnosis present

## 2013-02-11 DIAGNOSIS — F17200 Nicotine dependence, unspecified, uncomplicated: Secondary | ICD-10-CM | POA: Diagnosis present

## 2013-02-11 DIAGNOSIS — Z880 Allergy status to penicillin: Secondary | ICD-10-CM

## 2013-02-11 DIAGNOSIS — G609 Hereditary and idiopathic neuropathy, unspecified: Secondary | ICD-10-CM | POA: Diagnosis present

## 2013-02-11 DIAGNOSIS — M069 Rheumatoid arthritis, unspecified: Principal | ICD-10-CM | POA: Diagnosis present

## 2013-02-11 LAB — CBC WITH DIFFERENTIAL/PLATELET
Basophils Absolute: 0 10*3/uL (ref 0.0–0.1)
Basophils Relative: 0 % (ref 0–1)
EOS PCT: 2 % (ref 0–5)
Eosinophils Absolute: 0.2 10*3/uL (ref 0.0–0.7)
HEMATOCRIT: 34.7 % — AB (ref 36.0–46.0)
Hemoglobin: 11.8 g/dL — ABNORMAL LOW (ref 12.0–15.0)
LYMPHS ABS: 1 10*3/uL (ref 0.7–4.0)
Lymphocytes Relative: 13 % (ref 12–46)
MCH: 29.3 pg (ref 26.0–34.0)
MCHC: 34 g/dL (ref 30.0–36.0)
MCV: 86.1 fL (ref 78.0–100.0)
MONO ABS: 0.3 10*3/uL (ref 0.1–1.0)
Monocytes Relative: 4 % (ref 3–12)
NEUTROS ABS: 5.9 10*3/uL (ref 1.7–7.7)
Neutrophils Relative %: 81 % — ABNORMAL HIGH (ref 43–77)
PLATELETS: 431 10*3/uL — AB (ref 150–400)
RBC: 4.03 MIL/uL (ref 3.87–5.11)
RDW: 13.1 % (ref 11.5–15.5)
WBC: 7.4 10*3/uL (ref 4.0–10.5)

## 2013-02-11 LAB — C-REACTIVE PROTEIN: CRP: 12.5 mg/dL — ABNORMAL HIGH (ref <0.60)

## 2013-02-11 LAB — RAPID URINE DRUG SCREEN, HOSP PERFORMED
AMPHETAMINES: NOT DETECTED
BARBITURATES: NOT DETECTED
Benzodiazepines: NOT DETECTED
Cocaine: POSITIVE — AB
Opiates: NOT DETECTED
TETRAHYDROCANNABINOL: NOT DETECTED

## 2013-02-11 LAB — URINALYSIS, ROUTINE W REFLEX MICROSCOPIC
Bilirubin Urine: NEGATIVE
Glucose, UA: NEGATIVE mg/dL
HGB URINE DIPSTICK: NEGATIVE
KETONES UR: NEGATIVE mg/dL
Leukocytes, UA: NEGATIVE
NITRITE: POSITIVE — AB
PROTEIN: 30 mg/dL — AB
Specific Gravity, Urine: 1.027 (ref 1.005–1.030)
UROBILINOGEN UA: 1 mg/dL (ref 0.0–1.0)
pH: 5.5 (ref 5.0–8.0)

## 2013-02-11 LAB — COMPREHENSIVE METABOLIC PANEL
ALT: 7 U/L (ref 0–35)
AST: 13 U/L (ref 0–37)
Albumin: 3 g/dL — ABNORMAL LOW (ref 3.5–5.2)
Alkaline Phosphatase: 117 U/L (ref 39–117)
BUN: 36 mg/dL — ABNORMAL HIGH (ref 6–23)
CALCIUM: 8.7 mg/dL (ref 8.4–10.5)
CHLORIDE: 98 meq/L (ref 96–112)
CO2: 21 meq/L (ref 19–32)
Creatinine, Ser: 2.03 mg/dL — ABNORMAL HIGH (ref 0.50–1.10)
GFR, EST AFRICAN AMERICAN: 30 mL/min — AB (ref >90)
GFR, EST NON AFRICAN AMERICAN: 26 mL/min — AB (ref >90)
GLUCOSE: 105 mg/dL — AB (ref 70–99)
Potassium: 4 mEq/L (ref 3.7–5.3)
SODIUM: 135 meq/L — AB (ref 137–147)
Total Bilirubin: 0.2 mg/dL — ABNORMAL LOW (ref 0.3–1.2)
Total Protein: 8 g/dL (ref 6.0–8.3)

## 2013-02-11 LAB — CK: Total CK: 53 U/L (ref 7–177)

## 2013-02-11 LAB — URINE MICROSCOPIC-ADD ON

## 2013-02-11 LAB — LIPASE, BLOOD: LIPASE: 32 U/L (ref 11–59)

## 2013-02-11 LAB — SEDIMENTATION RATE: SED RATE: 93 mm/h — AB (ref 0–22)

## 2013-02-11 LAB — RHEUMATOID FACTOR: Rhuematoid fact SerPl-aCnc: 63 IU/mL — ABNORMAL HIGH (ref <=14)

## 2013-02-11 MED ORDER — HYDROMORPHONE HCL PF 1 MG/ML IJ SOLN
1.0000 mg | Freq: Once | INTRAMUSCULAR | Status: AC
  Administered 2013-02-11: 1 mg via INTRAVENOUS
  Filled 2013-02-11: qty 1

## 2013-02-11 MED ORDER — SODIUM CHLORIDE 0.9 % IV BOLUS (SEPSIS)
500.0000 mL | Freq: Once | INTRAVENOUS | Status: AC
  Administered 2013-02-11: 500 mL via INTRAVENOUS

## 2013-02-11 MED ORDER — SODIUM CHLORIDE 0.9 % IV SOLN
INTRAVENOUS | Status: AC
  Administered 2013-02-11: 09:00:00 via INTRAVENOUS

## 2013-02-11 MED ORDER — ONDANSETRON HCL 4 MG/2ML IJ SOLN: 4.0000 mg | Freq: Four times a day (QID) | INTRAMUSCULAR | Status: DC | PRN

## 2013-02-11 MED ORDER — HYDROMORPHONE HCL PF 1 MG/ML IJ SOLN
1.0000 mg | INTRAMUSCULAR | Status: DC | PRN
  Administered 2013-02-13 – 2013-02-14 (×4): 1 mg via INTRAVENOUS
  Filled 2013-02-11 (×4): qty 1

## 2013-02-11 MED ORDER — PANTOPRAZOLE SODIUM 40 MG PO TBEC
40.0000 mg | DELAYED_RELEASE_TABLET | Freq: Every day | ORAL | Status: DC
  Administered 2013-02-11 – 2013-02-15 (×5): 40 mg via ORAL
  Filled 2013-02-11 (×5): qty 1

## 2013-02-11 MED ORDER — SODIUM CHLORIDE 0.9 % IV SOLN
INTRAVENOUS | Status: AC
  Administered 2013-02-11: 13:00:00 via INTRAVENOUS

## 2013-02-11 MED ORDER — METHYLPREDNISOLONE SODIUM SUCC 125 MG IJ SOLR
60.0000 mg | Freq: Two times a day (BID) | INTRAMUSCULAR | Status: DC
  Administered 2013-02-11 – 2013-02-12 (×2): 60 mg via INTRAVENOUS
  Filled 2013-02-11 (×4): qty 0.96

## 2013-02-11 MED ORDER — HYDROCODONE-ACETAMINOPHEN 5-325 MG PO TABS
1.0000 | ORAL_TABLET | ORAL | Status: DC | PRN
  Administered 2013-02-11 – 2013-02-12 (×7): 2 via ORAL
  Filled 2013-02-11 (×7): qty 2

## 2013-02-11 MED ORDER — ENOXAPARIN SODIUM 30 MG/0.3ML ~~LOC~~ SOLN
30.0000 mg | SUBCUTANEOUS | Status: DC
  Administered 2013-02-11: 30 mg via SUBCUTANEOUS
  Filled 2013-02-11 (×2): qty 0.3

## 2013-02-11 MED ORDER — DIPHENHYDRAMINE HCL 25 MG PO CAPS
25.0000 mg | ORAL_CAPSULE | Freq: Four times a day (QID) | ORAL | Status: DC | PRN
  Administered 2013-02-11: 25 mg via ORAL
  Filled 2013-02-11: qty 1

## 2013-02-11 MED ORDER — HYDROXYZINE HCL 25 MG PO TABS
25.0000 mg | ORAL_TABLET | Freq: Three times a day (TID) | ORAL | Status: DC | PRN
  Administered 2013-02-11: 25 mg via ORAL
  Filled 2013-02-11: qty 1

## 2013-02-11 MED ORDER — ONDANSETRON HCL 4 MG/2ML IJ SOLN
4.0000 mg | Freq: Once | INTRAMUSCULAR | Status: AC
  Administered 2013-02-11: 4 mg via INTRAVENOUS
  Filled 2013-02-11: qty 2

## 2013-02-11 NOTE — ED Provider Notes (Signed)
CSN: [id number]     Arrival date & time 02/11/13  0413 History   First MD Initiated Contact with Patient 02/11/13 [phone]     Chief Complaint  Patient presents with  . Muscle Pain    generalized pain all over   (Consider location/radiation/quality/duration/timing/severity/associated sxs/prior Treatment) Patient is a 58 y.o. female presenting with musculoskeletal pain.  Muscle Pain Associated symptoms include chest pain and abdominal pain.   Patient presents with pain everywhere. History of rheumatoid arthritis and some chronic pain. States her primary care doctor is taken her off the pain medicine and her other medications. She states she's been off them for about a month. She states she's been feeling bad for a month. She states she's been unable to get out of bed for several weeks. She hurts in her arms and legs and chest and abdomen. She states she has some pain with urination also. No fevers. She's had a decreased appetite. She states that the pain is in her bones and they are "turning".   Past Medical History  Diagnosis Date  . Active smoker   . Rheumatoid arthritis 1  . Hypertension    Past Surgical History  Procedure Laterality Date  . Esophagogastroduodenoscopy N/A 10/10/2012    Procedure: ESOPHAGOGASTRODUODENOSCOPY (EGD);  Surgeon: [name], MD;  Location: [facility];  Service: Endoscopy;  Laterality: N/A;   No family history on file. History  Substance Use Topics  . Smoking status: Current Every Day Smoker -- 0.50 packs/day    Types: Cigarettes  . Smokeless tobacco: Not on file  . Alcohol Use: No   OB History   Grav Para Term Preterm Abortions TAB SAB Ect Mult Living                 Review of Systems  Constitutional: Negative for appetite change.  Respiratory: Negative for cough.   Cardiovascular: Positive for chest pain.  Gastrointestinal: Positive for abdominal pain.  Musculoskeletal: Positive for arthralgias, gait problem and myalgias.  Skin: Positive for  color change. Negative for wound.    Allergies  Iohexol and Penicillins  Home Medications   Current Outpatient Rx  Name  Route  Sig  Dispense  Refill  . lisinopril-hydrochlorothiazide (PRINZIDE,ZESTORETIC) 20-12.5 MG per tablet   Oral   Take 1 tablet by mouth every morning.          . naproxen sodium (ANAPROX) 220 MG tablet   Oral   Take 440 mg by mouth 2 (two) times daily as needed (pain).           BP 108/71  Pulse 81  Temp(Src) 98.5 F (36.9 C) (Oral)  Resp 17  SpO2 100% Physical Exam  Constitutional: She is oriented to person, place, and time. She appears well-developed and well-nourished.  HENT:  Head: Normocephalic.  Eyes: Pupils are equal, round, and reactive to light.  Cardiovascular: Normal rate and regular rhythm.   Pulmonary/Chest: Effort normal and breath sounds normal.  Abdominal: Soft.  Musculoskeletal: She exhibits tenderness.  Diffuse tenderness over most joints. Moderate tenderness over his muscles also. Patient states that she hurts everywhere palpated. Pain with movement of both lower extremities. Decreased range of motion due to pain. Sensation grossly intact.  Neurological: She is alert and oriented to person, place, and time.  Skin:  Mild mottling of skin to bilateral inner thighs.    ED Course  Procedures (including critical care time) Labs Review Labs Reviewed  CBC WITH DIFFERENTIAL - Abnormal; Notable for the following:  Hemoglobin 11.8 (*)    HCT 34.7 (*)    Platelets 431 (*)    Neutrophils Relative % 81 (*)    All other components within normal limits  COMPREHENSIVE METABOLIC PANEL - Abnormal; Notable for the following:    Sodium 135 (*)    Glucose, Bld 105 (*)    BUN 36 (*)    Creatinine, Ser 2.03 (*)    Albumin 3.0 (*)    Total Bilirubin 0.2 (*)    GFR calc non Af Amer 26 (*)    GFR calc Af Amer 30 (*)    All other components within normal limits  CK  URINALYSIS, ROUTINE W REFLEX MICROSCOPIC   Imaging Review No results  found.  EKG Interpretation   None       MDM   1. Rheumatoid arthritis flare   2. Renal insufficiency   3. Pain    Patient with pain everywhere, but possibly centered over joints. Has a history of rheumatoid arthritis and is not on any medication for it now. Lab shows increased creatinine. Has been unable to tolerate pain at home and is no longer able to work because of it. Will admit to internal medicine.    Jasper Riling. Alvino Chapel, MD 02/11/13 [phone]

## 2013-02-11 NOTE — H&P (Signed)
[facility] History and Physical  [name] EXH:[id number] DOB: Aug 20, 1955 DOA: 02/11/2013  Referring physician: EDP PCP: Philis Fendt, MD   Chief Complaint: pain all over  HPI: [name] is a 58 y.o. female with PMh of HTN, RA, Asthma, polysubstance abuse presents to the ER with the above complaints. She is a very poor historian and reports worsening joint pains especially involving the small joints of her hands, and both knees, R shoulder for the last 3 weeks. She was briefly on prednisone for suspected RA flare when discharged from [facility] in October, but has not seen a rheumatologist as advised then. She reports being debilitated due to her joints and now having to use 2 Canes to ambulate. She also reports abdominal pain which she's had off and on for about a month. SHe reports that her PCP is trying to treat her Pain with tramadol which hasn't helped her at all and hence as a result she's been taking a lot of ALeve over the last month. In ER, multiple complaints of pain and Creatinine up to 2 from baseline of 1 and hence [facility] consulted   Review of Systems:  Constitutional:  No weight loss, night sweats, Fevers, chills, fatigue.  HEENT:  No headaches, Difficulty swallowing,Tooth/dental problems,Sore throat,  No sneezing, itching, ear ache, nasal congestion, post nasal drip,  Cardio-vascular:  No chest pain, Orthopnea, PND, swelling in lower extremities, anasarca, dizziness, palpitations  GI:  No heartburn, indigestion, abdominal pain, nausea, vomiting, diarrhea, change in bowel habits, loss of appetite  Resp:  No shortness of breath with exertion or at rest. No excess mucus, no productive cough, No non-productive cough, No coughing up of blood.No change in color of mucus.No wheezing.No chest wall deformity  Skin:  no rash or lesions.  GU:  no dysuria, change in color of urine, no urgency or frequency. No flank pain.  Musculoskeletal:  No joint pain or swelling. No  decreased range of motion. No back pain.  Psych:  No change in mood or affect. No depression or anxiety. No memory loss.   Past Medical History  Diagnosis Date  . Active smoker   . Rheumatoid arthritis 1  . Hypertension    Past Surgical History  Procedure Laterality Date  . Esophagogastroduodenoscopy N/A 10/10/2012    Procedure: ESOPHAGOGASTRODUODENOSCOPY (EGD);  Surgeon: [name], MD;  Location: [facility];  Service: Endoscopy;  Laterality: N/A;   Social History:  reports that she has been smoking Cigarettes.  She has been smoking about 0.50 packs per day. She does not have any smokeless tobacco history on file. She reports that she does not drink alcohol or use illicit drugs.  Allergies  Allergen Reactions  . Iohexol Anaphylaxis and Swelling     Desc: THROAT SWELLING   . Penicillins Itching    History reviewed. No pertinent family history.   Prior to Admission medications   Medication Sig Start Date End Date Taking? Authorizing Provider  lisinopril-hydrochlorothiazide (PRINZIDE,ZESTORETIC) 20-12.5 MG per tablet Take 1 tablet by mouth every morning.    Yes Historical Provider, MD  naproxen sodium (ANAPROX) 220 MG tablet Take 440 mg by mouth 2 (two) times daily as needed (pain).    Yes Historical Provider, MD   Physical Exam: Filed Vitals:   02/11/13 0951  BP: 114/69  Pulse: 90  Temp: 98 F (36.7 C)  Resp: 18    BP 114/69  Pulse 90  Temp(Src) 98 F (36.7 C) (Oral)  Resp 18  Ht _0  (1.702 m)  Wt 65.772 kg (145 lb)  BMI 22.71 kg/m2  SpO2 93%  General:  Appears calm and comfortable, AAOx3, no distress Eyes: PERRL, normal lids, irises & conjunctiva ENT: grossly normal hearing, lips & tongue Neck: no LAD, masses or thyromegaly Cardiovascular: RRR, no m/r/g. No LE edema. Telemetry: SR, no arrhythmias  Respiratory: CTA bilaterally, no w/r/r. Normal respiratory effort. Abdomen: soft, nt,nd, Bs present Skin: no rash or induration seen on limited  exam Musculoskeletal: synovitis of MCP joint of both hands Bilateral knee effusions with painful RoM Psychiatric: grossly normal mood and affect, speech fluent and appropriate Neurologic: grossly non-focal.          Labs on Admission:  Basic Metabolic Panel:  Recent Labs Lab 02/11/13 0525  NA 135*  K 4.0  CL 98  CO2 21  GLUCOSE 105*  BUN 36*  CREATININE 2.03*  CALCIUM 8.7   Liver Function Tests:  Recent Labs Lab 02/11/13 0525  AST 13  ALT 7  ALKPHOS 117  BILITOT 0.2*  PROT 8.0  ALBUMIN 3.0*    Recent Labs Lab 02/11/13 0525  LIPASE 32   No results found for this basename: AMMONIA,  in the last 168 hours CBC:  Recent Labs Lab 02/11/13 0525  WBC 7.4  NEUTROABS 5.9  HGB 11.8*  HCT 34.7*  MCV 86.1  PLT 431*   Cardiac Enzymes:  Recent Labs Lab 02/11/13 0525  CKTOTAL 53    BNP (last 3 results) No results found for this basename: PROBNP,  in the last 8760 hours CBG: No results found for this basename: GLUCAP,  in the last 168 hours  Radiological Exams on Admission: No results found.   Assessment/Plan  1. Suspected RA flare -synovitis of small joints of both hands, shoulder, knee -check ESR, CRP -also check RA and antiCCP ab -trial of IV steroids, needs rheum follow up -has not seen rheum in >10 years  2. AKI on CKD -due to NSAIDs, ACE and poor PO -hold ACE, hydrate  3. HTN -hold ACE for now, can start Amlodipine if BP trends up -hold ACE/HCTZ due to 2  4. Abdominal pain -somewhat chronic and intermittent -workup in 10/14 unremarkable -FU with GI, PPI  5. Cocaine abuse -counseled  6. Asthma  -stable  DVT proph: lovenox  Code Status: Full Code Family Communication: none at bedside Disposition Plan: inpatient  Time spent: 55mn  Robertta Halfhill [facility] Pager [phone]

## 2013-02-11 NOTE — ED Notes (Signed)
Pt arrived via EMS form home due to generalized pain all over,

## 2013-02-12 LAB — BASIC METABOLIC PANEL
BUN: 24 mg/dL — AB (ref 6–23)
CO2: 20 meq/L (ref 19–32)
Calcium: 8.7 mg/dL (ref 8.4–10.5)
Chloride: 103 mEq/L (ref 96–112)
Creatinine, Ser: 1.25 mg/dL — ABNORMAL HIGH (ref 0.50–1.10)
GFR calc non Af Amer: 46 mL/min — ABNORMAL LOW (ref >90)
GFR, EST AFRICAN AMERICAN: 53 mL/min — AB (ref >90)
Glucose, Bld: 154 mg/dL — ABNORMAL HIGH (ref 70–99)
POTASSIUM: 4.9 meq/L (ref 3.7–5.3)
Sodium: 137 mEq/L (ref 137–147)

## 2013-02-12 LAB — CBC
HCT: 31.2 % — ABNORMAL LOW (ref 36.0–46.0)
HEMOGLOBIN: 10.4 g/dL — AB (ref 12.0–15.0)
MCH: 29.1 pg (ref 26.0–34.0)
MCHC: 33.3 g/dL (ref 30.0–36.0)
MCV: 87.2 fL (ref 78.0–100.0)
Platelets: 360 10*3/uL (ref 150–400)
RBC: 3.58 MIL/uL — ABNORMAL LOW (ref 3.87–5.11)
RDW: 13.2 % (ref 11.5–15.5)
WBC: 7.3 10*3/uL (ref 4.0–10.5)

## 2013-02-12 LAB — HEMOGLOBIN A1C
Hgb A1c MFr Bld: 5.9 % — ABNORMAL HIGH (ref <5.7)
MEAN PLASMA GLUCOSE: 123 mg/dL — AB (ref <117)

## 2013-02-12 LAB — CYCLIC CITRUL PEPTIDE ANTIBODY, IGG

## 2013-02-12 LAB — VITAMIN B12: Vitamin B-12: 364 pg/mL (ref 211–911)

## 2013-02-12 MED ORDER — ENOXAPARIN SODIUM 40 MG/0.4ML ~~LOC~~ SOLN
40.0000 mg | SUBCUTANEOUS | Status: DC
  Administered 2013-02-12 – 2013-02-15 (×4): 40 mg via SUBCUTANEOUS
  Filled 2013-02-12 (×4): qty 0.4

## 2013-02-12 MED ORDER — METHYLPREDNISOLONE SODIUM SUCC 40 MG IJ SOLR
40.0000 mg | Freq: Two times a day (BID) | INTRAMUSCULAR | Status: DC
  Administered 2013-02-12 (×2): 40 mg via INTRAVENOUS
  Filled 2013-02-12 (×4): qty 1

## 2013-02-12 MED ORDER — LIP MEDEX EX OINT
TOPICAL_OINTMENT | CUTANEOUS | Status: AC
  Administered 2013-02-12: 1
  Filled 2013-02-12: qty 7

## 2013-02-12 MED ORDER — OXYCODONE-ACETAMINOPHEN 5-325 MG PO TABS
1.0000 | ORAL_TABLET | Freq: Once | ORAL | Status: AC
  Administered 2013-02-12: 1 via ORAL
  Filled 2013-02-12: qty 1

## 2013-02-12 MED ORDER — SODIUM CHLORIDE 0.9 % IV SOLN
INTRAVENOUS | Status: DC
  Administered 2013-02-12 (×2): via INTRAVENOUS

## 2013-02-12 MED ORDER — OXYCODONE-ACETAMINOPHEN 5-325 MG PO TABS
1.0000 | ORAL_TABLET | ORAL | Status: DC | PRN
  Administered 2013-02-13 (×3): 1 via ORAL
  Filled 2013-02-12 (×3): qty 1

## 2013-02-12 MED ORDER — SALINE SPRAY 0.65 % NA SOLN
1.0000 | NASAL | Status: DC | PRN
  Administered 2013-02-12 – 2013-02-15 (×3): 1 via NASAL
  Filled 2013-02-12: qty 44

## 2013-02-12 NOTE — Evaluation (Signed)
Physical Therapy Evaluation Patient Details Name: [name] MRN: [id number] DOB: 1955-05-18 Today's Date: 02/12/2013 Time: 7353-2992 PT Time Calculation (min): 10 min  PT Assessment / Plan / Recommendation History of Present Illness  Pt is a 58 year old female and presents with pain everywhere. History of rheumatoid arthritis and some chronic pain. States her primary care doctor is taken her off the pain medicine and her other medications. She states she's been off them for about a month. She states she's been feeling bad for a month. She states she's been unable to get out of bed for several weeks. She hurts in her arms and legs and chest and abdomen. She states she has some pain with urination also. No fevers. She's had a decreased appetite. She states that the pain is in her bones and they are "turning".   Clinical Impression  Pt admitted with suspected RA flare. Pt currently with functional limitations due to the deficits listed below (see PT Problem List).  Pt will benefit from skilled PT to increase their independence and safety with mobility to allow discharge to the venue listed below.  Pt lives with her daughter in a second floor apt and feels unable to return home at present due to increased pain and decreased mobility.  Recommend SNF at this time.     PT Assessment  Patient needs continued PT services    Follow Up Recommendations  SNF    Does the patient have the potential to tolerate intense rehabilitation      Barriers to Discharge        Equipment Recommendations  Rolling walker with 5" wheels    Recommendations for Other Services     Frequency Min 3X/week    Precautions / Restrictions Precautions Precautions: Fall   Pertinent Vitals/Pain 7/10 pain everywhere, RN aware       Mobility  Bed Mobility Overal bed mobility: Modified Independent Transfers Overall transfer level: Needs assistance Equipment used: Rolling walker (2 wheeled) Transfers: Sit to/from  Stand Sit to Stand: Min assist General transfer comment: verbal cues for safe technique using RW, assist to rise and control descent Ambulation/Gait Ambulation/Gait assistance: Min assist Ambulation Distance (Feet): 60 Feet Assistive device: Rolling walker (2 wheeled) Gait Pattern/deviations: Step-through pattern;Trunk flexed Gait velocity: decreased General Gait Details: increased forward flexion with verbal cues for RW distance and posture, shaky LE and UEs likely due to pain, pt reports UEs fatigued limiting distance, rest break seated required after 30 feet    Exercises     PT Diagnosis: Difficulty walking;Acute pain  PT Problem List: Decreased strength;Decreased activity tolerance;Decreased mobility;Decreased balance;Decreased safety awareness;Decreased knowledge of use of DME;Pain PT Treatment Interventions: DME instruction;Gait training;Stair training;Functional mobility training;Therapeutic activities;Therapeutic exercise;Patient/family education;Neuromuscular re-education;Balance training     PT Goals(Current goals can be found in the care plan section) Acute Rehab PT Goals PT Goal Formulation: With patient Time For Goal Achievement: 02/26/13 Potential to Achieve Goals: Good  Visit Information  Last PT Received On: 02/12/13 Assistance Needed: +1 History of Present Illness: Pt is a 58 year old female and presents with pain everywhere. History of rheumatoid arthritis and some chronic pain. States her primary care doctor is taken her off the pain medicine and her other medications. She states she's been off them for about a month. She states she's been feeling bad for a month. She states she's been unable to get out of bed for several weeks. She hurts in her arms and legs and chest and abdomen. She states she  has some pain with urination also. No fevers. She's had a decreased appetite. She states that the pain is in her bones and they are "turning".        Prior [place] expects to be discharged to:: Private residence Living Arrangements: Children (daughter - works days) Type of Home: Apartment Home Access: Stairs to enter Technical brewer of Steps: flight Entrance Stairs-Rails: Right Home Layout: One level Home Equipment: [place] - single point Prior Function Level of Independence: Independent with assistive device(s) Comments: pt reports using 2 SPCs to ambulate however not very mobile for past 3 weeks due to increased pain Communication Communication: No difficulties    Cognition  Cognition Arousal/Alertness: Awake/alert Behavior During Therapy: WFL for tasks assessed/performed Overall Cognitive Status: Within Functional Limits for tasks assessed    Extremity/Trunk Assessment Lower Extremity Assessment Lower Extremity Assessment: Generalized weakness   Balance    End of Session PT - End of Session Activity Tolerance: Patient limited by fatigue;Patient limited by pain Patient left: in chair;with call bell/phone within reach Nurse Communication: Mobility status (nsg tech aware of assist and RW as well as pt in chair)  GP     Fontaine Hehl,KATHrine E 02/12/2013, 3:38 PM Carmelia Bake, PT, DPT 02/12/2013 Pager: [phone]

## 2013-02-12 NOTE — Progress Notes (Signed)
Was asked to see pt in regards to needing a rheumatologist. I met with pt at bedside who informed me that he PCP is Dr Jeanie Cooks. I called Dr Avbuere's office and was informed that pt was referred to Dr Trudie Reed in November. I reached out to Dr Trudie Reed office @ [phone] and was informed that they do not have this pt's information in the system and that they need another referral form her PCP. I have called and left a VM with Dr Avbuere's office informing them of this. I will follow up with them tomorrow to ensure they received the VM.  Allene Dillon RN BSN  [phone]

## 2013-02-12 NOTE — Progress Notes (Signed)
[facility] PROGRESS NOTE  [name] JTT:[id number] DOB: 09/19/1955 DOA: 02/11/2013 PCP: Philis Fendt, MD  Assessment/Plan: 1. Suspected RA flare  -synovitis of small joints of both hands, shoulder, knee  -elevated ESR, CRP, RA consistent with acute flare -FU  antiCCP ab  -needs rheum follow up  -has never seen rheumatology before   2. AKI on CKD  -due to NSAIDs, ACE and poor PO  -improving  -continue IVF -bmet in am  3. HTN  -hold ACE for now, can start Amlodipine if BP trends up  -hold ACE/HCTZ due to 2   4. Abdominal pain  -somewhat chronic and intermittent  -workup in 10/14 unremarkable  -FU with GI, PPI   5. Cocaine abuse  -counseled   6. Mild peripheral neuropathy -check hbaic, B12  7. Asthma  -stable   Pt eval  DVT proph: lovenox   Code Status: Full Code  Family Communication: none at bedside  Disposition Plan: inpatient   HPI/Subjective: Pain improving in joints, complains of tingling in feet  Objective: Filed Vitals:   02/12/13 1045  BP: 116/67  Pulse: 76  Temp: 97.6 F (36.4 C)  Resp: 20    Intake/Output Summary (Last 24 hours) at 02/12/13 1131 Last data filed at 02/11/13 2201  Gross per 24 hour  Intake   1310 ml  Output      0 ml  Net   1310 ml   Filed Weights   02/11/13 0951 02/11/13 1428  Weight: 65.772 kg (145 lb) 71.305 kg (157 lb 3.2 oz)    Exam:   General:  AAOx3  Cardiovascular: S1S2/RRR  Respiratory: CTAB  Abdomen: soft, Nt, BS present  Musculoskeletal: improved synovitis in hands and knee   Data Reviewed: Basic Metabolic Panel:  Recent Labs Lab 02/11/13 0525 02/12/13 0453  NA 135* 137  K 4.0 4.9  CL 98 103  CO2 21 20  GLUCOSE 105* 154*  BUN 36* 24*  CREATININE 2.03* 1.25*  CALCIUM 8.7 8.7   Liver Function Tests:  Recent Labs Lab 02/11/13 0525  AST 13  ALT 7  ALKPHOS 117  BILITOT 0.2*  PROT 8.0  ALBUMIN 3.0*    Recent Labs Lab 02/11/13 0525  LIPASE 32   No results  found for this basename: AMMONIA,  in the last 168 hours CBC:  Recent Labs Lab 02/11/13 0525 02/12/13 0453  WBC 7.4 7.3  NEUTROABS 5.9  --   HGB 11.8* 10.4*  HCT 34.7* 31.2*  MCV 86.1 87.2  PLT 431* 360   Cardiac Enzymes:  Recent Labs Lab 02/11/13 0525  CKTOTAL 53   BNP (last 3 results) No results found for this basename: PROBNP,  in the last 8760 hours CBG: No results found for this basename: GLUCAP,  in the last 168 hours  No results found for this or any previous visit (from the past 240 hour(s)).   Studies: No results found.  Scheduled Meds: . enoxaparin (LOVENOX) injection  40 mg Subcutaneous Q24H  . methylPREDNISolone (SOLU-MEDROL) injection  40 mg Intravenous Q12H  . pantoprazole  40 mg Oral Q1200   Continuous Infusions: . sodium chloride 75 mL/hr at 02/12/13 0092    Active Problems:   HTN (hypertension), benign   Hiatal hernia with gastroesophageal reflux   Pain   Cocaine abuse   Rheumatoid arthritis flare   Acute renal failure   AKI (acute kidney injury)    Time spent: 67mn    Deloyd Handy  [facility] Pager [phone] If 7PM-7AM, please contact night-coverage  at [email or web address], password [facility] 02/12/2013, 11:31 AM  LOS: 1 day

## 2013-02-13 LAB — BASIC METABOLIC PANEL
BUN: 25 mg/dL — ABNORMAL HIGH (ref 6–23)
CALCIUM: 8.4 mg/dL (ref 8.4–10.5)
CO2: 20 mEq/L (ref 19–32)
Chloride: 106 mEq/L (ref 96–112)
Creatinine, Ser: 1.01 mg/dL (ref 0.50–1.10)
GFR calc Af Amer: 69 mL/min — ABNORMAL LOW (ref >90)
GFR, EST NON AFRICAN AMERICAN: 60 mL/min — AB (ref >90)
Glucose, Bld: 137 mg/dL — ABNORMAL HIGH (ref 70–99)
POTASSIUM: 4.7 meq/L (ref 3.7–5.3)
Sodium: 138 mEq/L (ref 137–147)

## 2013-02-13 MED ORDER — PREDNISONE 50 MG PO TABS
50.0000 mg | ORAL_TABLET | Freq: Every day | ORAL | Status: DC
  Administered 2013-02-13 – 2013-02-15 (×3): 50 mg via ORAL
  Filled 2013-02-13 (×4): qty 1

## 2013-02-13 NOTE — Progress Notes (Addendum)
[facility] PROGRESS NOTE  [name] OFB:[id number] DOB: 02-20-1955 DOA: 02/11/2013 PCP: Philis Fendt, MD  Brief narrative: [name] is a 58 y.o. female with PMh of HTN, RA, Asthma, polysubstance abuse presented to the ER with the complaints of increasing joint pains and decreased mobility.  She  reported worsening joint pains especially involving the small joints of her hands, and both knees, R shoulder for the last 3 weeks.  She was briefly on prednisone for suspected RA flare when discharged from [facility] in October, but has not seen a rheumatologist as advised then.  She reported being debilitated due to her joints and now having to use 2 Canes to ambulate. In addition noted to have AKI on admission with creatinine of 2, up from baseline of 1. She was treated with IV steroids and IVF and improving at this time.  Assessment/Plan: 1. Suspected RA flare  -synovitis of small joints of both hands, shoulder, knee  -elevated ESR, CRP, RA consistent with acute flare -elevated antiCCP ab  -significantly improved with IV solumedrol, stop solumedrol and transition to Prednisone today -needs rheum follow up, i sent a referral to rheum at [facility] -has never seen rheumatology before   2. AKI on CKD  -due to NSAIDs, ACE and poor PO  -improved -cut down IVF  3. HTN  -can start Amlodipine if BP trends up  -hold ACE/HCTZ due to 2   4. Abdominal pain  -somewhat chronic and intermittent  -workup in 10/14 unremarkable  -FU with GI, PPI  -tolerating diet  5. Cocaine abuse  -counseled   6. Mild peripheral neuropathy -hbaic 5.9, B12 normal  7. Asthma  -stable   Pt eval completed, SNF recommended  DVT proph: lovenox   Code Status: Full Code  Family Communication: none at bedside  Disposition Plan: awaiting SNF   HPI/Subjective: Pain improving in joints, complains of tingling in feet, agreeable to SNF  Objective: Filed Vitals:   02/13/13 0500  BP: 128/72   Pulse: 65  Temp: 97.5 F (36.4 C)  Resp: 18    Intake/Output Summary (Last 24 hours) at 02/13/13 1034 Last data filed at 02/13/13 0800  Gross per 24 hour  Intake    480 ml  Output      0 ml  Net    480 ml   Filed Weights   02/11/13 0951 02/11/13 1428  Weight: 65.772 kg (145 lb) 71.305 kg (157 lb 3.2 oz)    Exam:   General:  AAOx3  Cardiovascular: S1S2/RRR  Respiratory: CTAB  Abdomen: soft, Nt, BS present  Musculoskeletal: improved synovitis in hands and knee  improved RoM of knees  Data Reviewed: Basic Metabolic Panel:  Recent Labs Lab 02/11/13 0525 02/12/13 0453 02/13/13 0528  NA 135* 137 138  K 4.0 4.9 4.7  CL 98 103 106  CO2 '21 20 20  ' GLUCOSE 105* 154* 137*  BUN 36* 24* 25*  CREATININE 2.03* 1.25* 1.01  CALCIUM 8.7 8.7 8.4   Liver Function Tests:  Recent Labs Lab 02/11/13 0525  AST 13  ALT 7  ALKPHOS 117  BILITOT 0.2*  PROT 8.0  ALBUMIN 3.0*    Recent Labs Lab 02/11/13 0525  LIPASE 32   No results found for this basename: AMMONIA,  in the last 168 hours CBC:  Recent Labs Lab 02/11/13 0525 02/12/13 0453  WBC 7.4 7.3  NEUTROABS 5.9  --   HGB 11.8* 10.4*  HCT 34.7* 31.2*  MCV 86.1 87.2  PLT 431* 360  Cardiac Enzymes:  Recent Labs Lab 02/11/13 0525  CKTOTAL 53   BNP (last 3 results) No results found for this basename: PROBNP,  in the last 8760 hours CBG: No results found for this basename: GLUCAP,  in the last 168 hours  No results found for this or any previous visit (from the past 240 hour(s)).   Studies: No results found.  Scheduled Meds: . enoxaparin (LOVENOX) injection  40 mg Subcutaneous Q24H  . pantoprazole  40 mg Oral Q1200  . predniSONE  50 mg Oral Q breakfast   Continuous Infusions: . sodium chloride 75 mL/hr at 02/12/13 1954    Active Problems:   HTN (hypertension), benign   Hiatal hernia with gastroesophageal reflux   Pain   Cocaine abuse   Rheumatoid arthritis flare   Acute renal failure    AKI (acute kidney injury)    Time spent: 61mn    Ajanee Buren  [facility] Pager [phone] If 7PM-7AM, please contact night-coverage at [email or web address], password [email or web address]2/03/2013, 10:34 AM  LOS: 2 days

## 2013-02-13 NOTE — Clinical Social Work Psychosocial (Signed)
Clinical Social Work Department BRIEF PSYCHOSOCIAL ASSESSMENT 02/13/2013  Patient:  [name], [name]     Account Number:  [id number]     Admit date:  02/11/2013  Clinical Social Worker:  Venia Minks  Date/Time:  02/13/2013 12:00 M  Referred by:  Physician  Date Referred:  02/13/2013 Referred for  SNF Placement   Other Referral:   Interview type:  Patient Other interview type:    PSYCHOSOCIAL DATA Living Status:  ALONE Admitted from facility:   Level of care:   Primary support name:  Christian Mate Primary support relationship to patient:  CHILD, ADULT Degree of support available:   good    CURRENT CONCERNS Current Concerns  Post-Acute Placement   Other Concerns:    SOCIAL WORK ASSESSMENT / PLAN CSW met with patient. patient is alert and oriented X3. CSW discussed need for snf and patient is agreeable to snf, however, CSW explained insurance limitations due to the fact that patient has medicaid only and that there are limited facilities, as well as the fact that patient will likely have to supplement with her Social security check. Patient is unwilling to do this. She states that she has a daughter and her daughter will take care of her at home. Patient is upset that this is not something her insurance will cover in its entirety. CSW offered emotional support and stated that it is still something she could do, she would just likely have to supplement it. Again, she is unwilling to do so. CSW communicated this to MD and care manager.   Assessment/plan status:   Other assessment/ plan:   Information/referral to community resources:    PATIENTS/FAMILYS RESPONSE TO PLAN OF CARE: Patient is upset at the fact that her insurance will not cover snf at a 35 percent without supplementing with her social security check as she states she will only be going for rehab. however, she is understanding of the risks should she decide to go home and is now adamant about going  home with her daughter. Patient reports a good support system and states that she will have someone with her to assist her. no further CSW needs noted. Please reconsult as needed.

## 2013-02-14 ENCOUNTER — Inpatient Hospital Stay (HOSPITAL_COMMUNITY): Payer: Medicaid Other

## 2013-02-14 DIAGNOSIS — R1900 Intra-abdominal and pelvic swelling, mass and lump, unspecified site: Secondary | ICD-10-CM

## 2013-02-14 LAB — URINALYSIS, ROUTINE W REFLEX MICROSCOPIC
Bilirubin Urine: NEGATIVE
GLUCOSE, UA: NEGATIVE mg/dL
Hgb urine dipstick: NEGATIVE
KETONES UR: NEGATIVE mg/dL
LEUKOCYTES UA: NEGATIVE
NITRITE: NEGATIVE
Protein, ur: NEGATIVE mg/dL
SPECIFIC GRAVITY, URINE: 1.018 (ref 1.005–1.030)
Urobilinogen, UA: 0.2 mg/dL (ref 0.0–1.0)
pH: 6 (ref 5.0–8.0)

## 2013-02-14 MED ORDER — SENNOSIDES-DOCUSATE SODIUM 8.6-50 MG PO TABS
1.0000 | ORAL_TABLET | Freq: Two times a day (BID) | ORAL | Status: DC
  Administered 2013-02-14 – 2013-02-15 (×3): 1 via ORAL
  Filled 2013-02-14 (×4): qty 1

## 2013-02-14 MED ORDER — HYDROCHLOROTHIAZIDE 12.5 MG PO CAPS
12.5000 mg | ORAL_CAPSULE | Freq: Every day | ORAL | Status: DC
  Administered 2013-02-14 – 2013-02-15 (×2): 12.5 mg via ORAL
  Filled 2013-02-14 (×2): qty 1

## 2013-02-14 MED ORDER — POLYETHYLENE GLYCOL 3350 17 G PO PACK
17.0000 g | PACK | Freq: Every day | ORAL | Status: DC
  Administered 2013-02-14: 17 g via ORAL
  Filled 2013-02-14 (×2): qty 1

## 2013-02-14 MED ORDER — OXYCODONE-ACETAMINOPHEN 5-325 MG PO TABS
1.0000 | ORAL_TABLET | ORAL | Status: DC | PRN
  Administered 2013-02-14 – 2013-02-15 (×5): 2 via ORAL
  Filled 2013-02-14 (×2): qty 1
  Filled 2013-02-14 (×4): qty 2

## 2013-02-14 MED ORDER — SODIUM CHLORIDE 0.9 % IJ SOLN
3.0000 mL | Freq: Two times a day (BID) | INTRAMUSCULAR | Status: DC
  Administered 2013-02-14: 3 mL via INTRAVENOUS

## 2013-02-14 MED ORDER — LISINOPRIL-HYDROCHLOROTHIAZIDE 20-12.5 MG PO TABS: 1.0000 | ORAL_TABLET | Freq: Every morning | ORAL | Status: DC

## 2013-02-14 MED ORDER — ONDANSETRON HCL 4 MG PO TABS: 4.0000 mg | ORAL_TABLET | ORAL | Status: DC | PRN

## 2013-02-14 MED ORDER — LISINOPRIL 20 MG PO TABS
20.0000 mg | ORAL_TABLET | Freq: Every day | ORAL | Status: DC
  Administered 2013-02-14 – 2013-02-15 (×2): 20 mg via ORAL
  Filled 2013-02-14 (×2): qty 1

## 2013-02-14 NOTE — Progress Notes (Signed)
Patient ID: Nelia Rogoff, female   DOB: 16-Mar-1955, 57 y.o.   MRN: [id number]  [facility] PROGRESS NOTE  Branda Chaudhary DXI:[id number] DOB: 1955/08/25 DOA: 02/11/2013 PCP: Philis Fendt, MD  Brief narrative: Patient is 58 year old female with hypertension, rheumatoid arthritis, asthma, polysubstance abuse including cocaine and marijuana, presented to emergency department on 02/11/2013 with main concern of 2 weeks duration of progressively worsening joints pain and difficulty with ambulating due to pain. Pain mostly involving small joints in hands and both knees. Patient explains she was treated with prednisone in October 2014 but has not seen a rheumatologist.  Active Problems:   Bilateral in multiple joints pain - Consistent with rheumatoid arthritis flare given elevated ESR, anti-CCP antibody, CRP - Continue prednisone with slow tapering, referral to rheumatologist provided by Dr. Broadus John   Fever - Unclear etiology, Tmax over 24-hour period 100.5 F - Possibly related to RA flare, will order chest x-ray and urinalysis for further evaluation - Defer use of antibiotics at this time until clear infectious etiology identified   HTN (hypertension), benign - Slightly above target range, will restart patient home medical regimen lisinopril-HCTZ   Hiatal hernia with gastroesophageal reflux - Stable, continue protonix   Cocaine abuse - Patient discussed in detail, patient verbalized understanding   Acute renal failure - Most likely prerenal in etiology, IV fluids provided and creatinine is trending down, within normal limits this morning - BMP in the morning  Consultants:  None  Procedures/Studies:  None  Antibiotics:  None  Code Status: Full Family Communication: Pt at bedside Disposition Plan: Home when medically stable  HPI/Subjective: No events overnight.   Objective: Filed Vitals:   02/13/13 0500 02/13/13 1400 02/13/13 2200 02/14/13 0631  BP: 128/72 136/76 129/67  153/77  Pulse: 65 66 69 66  Temp: 97.5 F (36.4 C) 100.2 F (37.9 C) 98.4 F (36.9 C) 98 F (36.7 C)  TempSrc: Oral Oral Oral Oral  Resp: '18 18 18 18  ' Height:      Weight:      SpO2: 98% 97% 96% 98%    Intake/Output Summary (Last 24 hours) at 02/14/13 1118 Last data filed at 02/14/13 0843  Gross per 24 hour  Intake    840 ml  Output    400 ml  Net    440 ml    Exam:   General:  Pt is alert, follows commands appropriately, not in acute distress  Cardiovascular: Regular rate and rhythm, S1/S2, no murmurs, no rubs, no gallops  Respiratory: Clear to auscultation bilaterally, no wheezing, no crackles, no rhonchi  Abdomen: Soft, non tender, non distended, bowel sounds present, no guarding  Extremities: No edema, pulses DP and PT palpable bilaterally  Neuro: Grossly nonfocal  Data Reviewed: Basic Metabolic Panel:  Recent Labs Lab 02/11/13 0525 02/12/13 0453 02/13/13 0528  NA 135* 137 138  K 4.0 4.9 4.7  CL 98 103 106  CO2 '21 20 20  ' GLUCOSE 105* 154* 137*  BUN 36* 24* 25*  CREATININE 2.03* 1.25* 1.01  CALCIUM 8.7 8.7 8.4   Liver Function Tests:  Recent Labs Lab 02/11/13 0525  AST 13  ALT 7  ALKPHOS 117  BILITOT 0.2*  PROT 8.0  ALBUMIN 3.0*    Recent Labs Lab 02/11/13 0525  LIPASE 32   CBC:  Recent Labs Lab 02/11/13 0525 02/12/13 0453  WBC 7.4 7.3  NEUTROABS 5.9  --   HGB 11.8* 10.4*  HCT 34.7* 31.2*  MCV 86.1 87.2  PLT 431* 360  Cardiac Enzymes:  Recent Labs Lab 02/11/13 0525  CKTOTAL 53    Scheduled Meds: . enoxaparin (LOVENOX) injection  40 mg Subcutaneous Q24H  . lisinopril  20 mg Oral Daily   And  . hydrochlorothiazide  12.5 mg Oral Daily  . pantoprazole  40 mg Oral Q1200  . polyethylene glycol  17 g Oral Daily  . predniSONE  50 mg Oral Q breakfast  . senna-docusate  1 tablet Oral BID   Continuous Infusions: . sodium chloride 20 mL/hr at 02/13/13 1500   Faye Ramsay, MD  [facility] Pager [phone]  If 7PM-7AM,  please contact night-coverage [email or web address] Password [email or web address] 02/14/2013, 11:18 AM   LOS: 3 days

## 2013-02-14 NOTE — Care Management Note (Signed)
    Page 1 of 1   02/14/2013     10:20:06 AM   CARE MANAGEMENT NOTE 02/14/2013  Patient:  [name], [name]   Account Number:  [id number]  Date Initiated:  02/12/2013  Documentation initiated by:  [facility]  Subjective/Objective Assessment:   59 year old female admitted with AKI.     Action/Plan:   From home. PT/OT consults pending to help determine d/c needs.   Anticipated DC Date:  02/14/2013   Anticipated DC Plan:  [place]  CM consult      Choice offered to / List presented to:  C-1 Patient        [place] arranged  HH-1 RN      [place].   Status of service:  In process, will continue to follow Medicare Important Message given?  NA - LOS <3 / Initial given by admissions (If response is "NO", the following Medicare IM given date fields will be blank) Date Medicare IM given:   Date Additional Medicare IM given:    Discharge Disposition:  [place]  Per UR Regulation:  Reviewed for med. necessity/level of care/duration of stay  If discussed at [place] of Stay Meetings, dates discussed:    Comments:

## 2013-02-14 NOTE — Progress Notes (Signed)
Physical Therapy Treatment Patient Details Name: [place] Kreitzer MRN: [id number] DOB: 12-01-1955 Today's Date: 02/14/2013 Time: 7741-2878 PT Time Calculation (min): 13 min  PT Assessment / Plan / Recommendation  History of Present Illness Pt is a 58 year old female and presents with pain everywhere. History of rheumatoid arthritis and some chronic pain. States her primary care doctor is taken her off the pain medicine and her other medications. She states she's been off them for about a month. She states she's been feeling bad for a month. She states she's been unable to get out of bed for several weeks. She hurts in her arms and legs and chest and abdomen. She states she has some pain with urination also. No fevers. She's had a decreased appetite. She states that the pain is in her bones and they are "turning".    PT Comments   Progressing with mobility. Pt has decided that she will d/c home instead of SNF. Will need to practice stair negotiation on next session if pt is able to tolerate.   Follow Up Recommendations  Home health PT (pt declines SNF placement)     Does the patient have the potential to tolerate intense rehabilitation     Barriers to Discharge        Equipment Recommendations  Rolling walker with 5" wheels    Recommendations for Other Services    Frequency Min 3X/week   Progress towards PT Goals Progress towards PT goals: Progressing toward goals  Plan Current plan remains appropriate    Precautions / Restrictions Precautions Precautions: Fall Restrictions Weight Bearing Restrictions: No   Pertinent Vitals/Pain bil LEs, bil wrists/hands 5/10     Mobility  Bed Mobility Overal bed mobility: Modified Independent Transfers Overall transfer level: Needs assistance Equipment used: Rolling walker (2 wheeled) Transfers: Sit to/from Stand Sit to Stand: Min guard General transfer comment: VCs for safety, technique, hand placement. Pt prefers to pull up on walker. close  guard for safety Ambulation/Gait Ambulation/Gait assistance: Min guard Ambulation Distance (Feet): 140 Feet Assistive device: Rolling walker (2 wheeled) Gait Pattern/deviations: Decreased stride length;Antalgic;Trunk flexed;Step-to pattern;Step-through pattern General Gait Details: increased forward flexion with verbal cues for RW distance and posture, shaky LE and UEs likely due to pain. 2 brief standing rest breaks due to fatigue. slow gait speed. Pt reports feet feel numb.     Exercises     PT Diagnosis:    PT Problem List:   PT Treatment Interventions:     PT Goals (current goals can now be found in the care plan section)    Visit Information  Last PT Received On: 02/14/13 Assistance Needed: +1 History of Present Illness: Pt is a 58 year old female and presents with pain everywhere. History of rheumatoid arthritis and some chronic pain. States her primary care doctor is taken her off the pain medicine and her other medications. She states she's been off them for about a month. She states she's been feeling bad for a month. She states she's been unable to get out of bed for several weeks. She hurts in her arms and legs and chest and abdomen. She states she has some pain with urination also. No fevers. She's had a decreased appetite. She states that the pain is in her bones and they are "turning".     Subjective Data      Cognition  Cognition Arousal/Alertness: Awake/alert Behavior During Therapy: WFL for tasks assessed/performed Overall Cognitive Status: Within Functional Limits for tasks assessed  Balance     End of Session PT - End of Session Equipment Utilized During Treatment: Gait belt Activity Tolerance: Patient limited by fatigue;Patient limited by pain Patient left: in bed;with call bell/phone within reach   GP     Weston Anna, MPT Pager: [phone]

## 2013-02-15 LAB — BASIC METABOLIC PANEL
BUN: 22 mg/dL (ref 6–23)
CHLORIDE: 100 meq/L (ref 96–112)
CO2: 24 mEq/L (ref 19–32)
CREATININE: 1.25 mg/dL — AB (ref 0.50–1.10)
Calcium: 8.6 mg/dL (ref 8.4–10.5)
GFR calc Af Amer: 53 mL/min — ABNORMAL LOW (ref >90)
GFR calc non Af Amer: 46 mL/min — ABNORMAL LOW (ref >90)
GLUCOSE: 106 mg/dL — AB (ref 70–99)
POTASSIUM: 4.1 meq/L (ref 3.7–5.3)
Sodium: 135 mEq/L — ABNORMAL LOW (ref 137–147)

## 2013-02-15 LAB — CBC
HCT: 37.5 % (ref 36.0–46.0)
HEMOGLOBIN: 12.7 g/dL (ref 12.0–15.0)
MCH: 29.4 pg (ref 26.0–34.0)
MCHC: 33.9 g/dL (ref 30.0–36.0)
MCV: 86.8 fL (ref 78.0–100.0)
Platelets: 496 10*3/uL — ABNORMAL HIGH (ref 150–400)
RBC: 4.32 MIL/uL (ref 3.87–5.11)
RDW: 13.1 % (ref 11.5–15.5)
WBC: 15.2 10*3/uL — ABNORMAL HIGH (ref 4.0–10.5)

## 2013-02-15 MED ORDER — PREDNISONE 10 MG PO TABS: 10.0000 mg | ORAL_TABLET | Freq: Every day | ORAL | Status: DC

## 2013-02-15 MED ORDER — PANTOPRAZOLE SODIUM 40 MG PO TBEC: 40.0000 mg | DELAYED_RELEASE_TABLET | Freq: Every day | ORAL | Status: DC

## 2013-02-15 MED ORDER — CIPROFLOXACIN HCL 500 MG PO TABS: 500.0000 mg | ORAL_TABLET | Freq: Two times a day (BID) | ORAL | Status: DC

## 2013-02-15 NOTE — Discharge Instructions (Signed)
Rheumatoid Arthritis Rheumatoid arthritis is a long-term (chronic) inflammatory disease that causes pain, swelling, and stiffness of the joints. It can affect the entire body, including the eyes and lungs. The effects of rheumatoid arthritis vary widely among those with the condition. CAUSES  The cause of rheumatoid arthritis is not known. It tends to run in families and is more common in women. Certain cells of the body's natural defense system (immune system) do not work properly and begin to attack healthy joints. It primarily involves the connective tissue that lines the joints (synovial membrane). This can cause damage to the joint. SYMPTOMS   Pain, stiffness, swelling, and decreased motion of many joints, especially in the hands and feet.  Stiffness that is worse in the morning. It may last 1 2 hours or longer.  Numbness and tingling in the hands.  Fatigue.  Loss of appetite.  Weight loss.  Low-grade fever.  Dry eyes and mouth.  Firm lumps (rheumatoid nodules) that grow beneath the skin in areas such as the elbows and hands. DIAGNOSIS  Diagnosis is based on the symptoms described, an exam, and blood tests. Sometimes, X-rays are helpful. TREATMENT  The goals of treatment are to relieve pain, reduce inflammation, and to slow down or stop joint damage and disability. Methods vary and may include:  Maintaining a balance of rest, exercise, and proper nutrition.  Medicines:  Pain relievers (analgesics).  Corticosteroids and nonsteroidal anti-inflammatory drugs (NSAIDs) to reduce inflammation.  Disease-modifying antirheumatic drugs (DMARDs) to try to slow the course of the disease.  Biologic response modifiers to reduce inflammation and damage.  Physical therapy and occupational therapy.  Surgery for patients with severe joint damage. Joint replacement or fusing of joints may be needed.  Routine monitoring and ongoing care, such as office visits, blood and urine tests, and  X-rays. HOME CARE INSTRUCTIONS   Remain physically active and reduce activity when the disease gets worse.  Eat a well-balanced diet.  Put heat on affected joints when you wake up and before activities. Keep the heat on the affected joint for as long as directed by your caregiver.  Put ice on affected joints following activities or exercising.  Put ice in a plastic bag.  Place a towel between your skin and the bag.  Leave the ice on for 15-20 minutes, 03-04 times a day.  Take all medicines and supplements as directed by your caregiver.  Use splints as directed by your caregiver. Splints help maintain joint position and function.  Do not sleep with pillows under your knees. This may lead to spasms.  Participate in a self-management program to keep current with the latest treatment and coping skills. SEEK IMMEDIATE MEDICAL CARE IF:  You have fainting episodes.  You have periods of extreme weakness.  You rapidly develop a hot, painful joint that is more severe than usual joint aches.  You have chills.  You have a fever. MAKE SURE YOU:  Understand these instructions.  Will watch your condition.  Will get help right away if you are not doing well or get worse. FOR MORE INFORMATION  American College of Rheumatology: www.rheumatology.org Arthritis Foundation: www.arthritis.org Document Released: 12/26/1999 Document Revised: 06/29/2011 Document Reviewed: 02/03/2011 ExitCare Patient Information 2014 [employer].  

## 2013-02-15 NOTE — Discharge Summary (Signed)
Physician Discharge Summary  [name] FXT:[id number] DOB: 1955-09-18 DOA: 02/11/2013  PCP: Philis Fendt, MD  Admit date: 02/11/2013 Discharge date: 02/15/2013  Recommendations for Outpatient Follow-up:  1. Pt will need to follow up with PCP in 2-3 weeks post discharge 2. Please obtain BMP to evaluate electrolytes and kidney function 3. Please also check CBC to evaluate Hg and Hct levels 4. If Cr persistently elevated please consider d/c Lisinopril    Discharge Diagnoses: UTI, acute rheumatoid arthritis flare  Active Problems:   HTN (hypertension), benign   Hiatal hernia with gastroesophageal reflux   Pain   Cocaine abuse   Rheumatoid arthritis flare   Acute renal failure   AKI (acute kidney injury)  Discharge Condition: Stable  Diet recommendation: Heart healthy diet discussed in details   Brief narrative:  Patient is 58 year old female with hypertension, rheumatoid arthritis, asthma, polysubstance abuse including cocaine and marijuana, presented to emergency department on 02/11/2013 with main concern of 2 weeks duration of progressively worsening joints pain and difficulty with ambulating due to pain. Pain mostly involving small joints in hands and both knees. Patient explains she was treated with prednisone in October 2014 but has not seen a rheumatologist.   Active Problems:  Bilateral in multiple joints pain  - Consistent with rheumatoid arthritis flare given elevated ESR, anti-CCP antibody, CRP  - Continue prednisone with slow tapering, referral to rheumatologist provided by Dr. Broadus John  Fever  - most likely secondary to UTI, urine culture positive for E. Coli - pt remains afebrile over 24 hours  - please note that pt was discharged on Ciprofloxacin for UTI  HTN (hypertension), benign  - Slightly above target range, will restart patient home medical regimen lisinopril-HCTZ  - careful monitoring of renal function Hiatal hernia with gastroesophageal reflux  - Stable,  continue protonix  Cocaine abuse  - Patient discussed in detail, patient verbalized understanding  Acute renal failure  - Most likely prerenal in etiology, IV fluids provided and creatinine is trending down - will need to have careful monitoring of renal function while on Lisinopril-Hctz  Consultants:  None  Procedures/Studies:  None  Antibiotics:  Ciprofloxacin upon discharge   Code Status: Full  Family Communication: Pt at bedside   Discharge Exam: Filed Vitals:   02/15/13 0538  BP: 146/85  Pulse: 63  Temp: 98.4 F (36.9 C)  Resp: 18   Filed Vitals:   02/14/13 0631 02/14/13 1406 02/14/13 2230 02/15/13 0538  BP: 153/77 138/78 145/64 146/85  Pulse: 66 81 68 63  Temp: 98 F (36.7 C) 98.4 F (36.9 C) 97.9 F (36.6 C) 98.4 F (36.9 C)  TempSrc: Oral Oral Oral Oral  Resp: _0 Height:      Weight:      SpO2: 98% 98% 97% 95%    General: Pt is alert, follows commands appropriately, not in acute distress Cardiovascular: Regular rate and rhythm, S1/S2 +, no murmurs, no rubs, no gallops Respiratory: Clear to auscultation bilaterally, no wheezing, no crackles, no rhonchi Abdominal: Soft, non tender, non distended, bowel sounds +, no guarding Extremities: no edema, no cyanosis, pulses palpable bilaterally DP and PT Neuro: Grossly nonfocal  Discharge Instructions  Discharge Orders   Future Appointments Provider Department Dept Phone   02/16/2013 2:45 PM [facility] [place]  IMAGING [phone]   Please wear two piece clothing and wear no powder or deodorant. Please arrive 15 minutes early prior to your appointment time.   Future Orders Complete  By Expires   Diet - low sodium heart healthy  As directed    Increase activity slowly  As directed        Medication List         lisinopril-hydrochlorothiazide 20-12.5 MG per tablet  Commonly known as:  PRINZIDE,ZESTORETIC  Take 1 tablet by mouth every morning.     naproxen sodium 220  MG tablet  Commonly known as:  ANAPROX  Take 440 mg by mouth 2 (two) times daily as needed (pain).     pantoprazole 40 MG tablet  Commonly known as:  PROTONIX  Take 1 tablet (40 mg total) by mouth daily at 12 noon.     predniSONE 10 MG tablet  Commonly known as:  DELTASONE  Take 1 tablet (10 mg total) by mouth daily with breakfast.    Ciprofloxacin 500 mg BID        Follow-up Information   Follow up with AVBUERE,EDWIN A, MD. Schedule an appointment as soon as possible for a visit in 1 week.   Specialty:  Internal Medicine   Contact information:   [address] [place] [address] [phone]        The results of significant diagnostics from this hospitalization (including imaging, microbiology, ancillary and laboratory) are listed below for reference.     Microbiology: Recent Results (from the past 240 hour(s))  URINE CULTURE     Status: None   Collection Time    02/14/13 12:26 PM      Result Value Range Status   Specimen Description URINE, RANDOM   Final   Special Requests NONE   Final   Culture  Setup Time     Final   Value: 02/14/2013 16:13     Performed at [facility]     Final   Value: >=100,000 COLONIES/ML     Performed at [facility]   Culture     Final   Value: ESCHERICHIA COLI     Performed at [facility]   Report Status PENDING   Incomplete     Labs: Basic Metabolic Panel:  Recent Labs Lab 02/11/13 0525 02/12/13 0453 02/13/13 0528 02/15/13 0520  NA 135* 137 138 135*  K 4.0 4.9 4.7 4.1  CL 98 103 106 100  CO2 _0 GLUCOSE 105* 154* 137* 106*  BUN 36* 24* 25* 22  CREATININE 2.03* 1.25* 1.01 1.25*  CALCIUM 8.7 8.7 8.4 8.6   Liver Function Tests:  Recent Labs Lab 02/11/13 0525  AST 13  ALT 7  ALKPHOS 117  BILITOT 0.2*  PROT 8.0  ALBUMIN 3.0*    Recent Labs Lab 02/11/13 0525  LIPASE 32   CBC:  Recent Labs Lab 02/11/13 0525 02/12/13 0453 02/15/13 0520  WBC 7.4  7.3 15.2*  NEUTROABS 5.9  --   --   HGB 11.8* 10.4* 12.7  HCT 34.7* 31.2* 37.5  MCV 86.1 87.2 86.8  PLT 431* 360 496*   Cardiac Enzymes:  Recent Labs Lab 02/11/13 0525  CKTOTAL 23   SIGNED: Time coordinating discharge: Over 30 minutes  Faye Ramsay, MD  [facility] 02/15/2013, 2:06 PM Pager [phone]  If 7PM-7AM, please contact night-coverage [email or web address] Password [email or web address]

## 2013-02-15 NOTE — Progress Notes (Signed)
Discharge instructions given to pt, verbalized understanding. Left the unit in stable condition. 

## 2013-02-16 ENCOUNTER — Ambulatory Visit
Admission: RE | Admit: 2013-02-16 | Discharge: 2013-02-16 | Disposition: A | Discharge status: Home / Self Care | Payer: Medicaid Other | Source: Ambulatory Visit | Attending: Internal Medicine | Admitting: Internal Medicine

## 2013-02-16 DIAGNOSIS — R928 Other abnormal and inconclusive findings on diagnostic imaging of breast: Secondary | ICD-10-CM

## 2013-02-16 LAB — URINE CULTURE: Colony Count: >=100000

## 2013-03-15 ENCOUNTER — Other Ambulatory Visit: Payer: Self-pay | Admitting: Internal Medicine

## 2013-03-15 DIAGNOSIS — R928 Other abnormal and inconclusive findings on diagnostic imaging of breast: Secondary | ICD-10-CM

## 2013-03-21 ENCOUNTER — Ambulatory Visit
Admission: RE | Admit: 2013-03-21 | Discharge: 2013-03-21 | Disposition: A | Discharge status: Home / Self Care | Payer: Medicaid Other | Source: Ambulatory Visit | Attending: Internal Medicine | Admitting: Internal Medicine

## 2013-03-21 DIAGNOSIS — R928 Other abnormal and inconclusive findings on diagnostic imaging of breast: Secondary | ICD-10-CM

## 2013-05-17 ENCOUNTER — Emergency Department (HOSPITAL_COMMUNITY)
Admission: EM | Admit: 2013-05-17 | Discharge: 2013-05-17 | Disposition: A | Discharge status: Home / Self Care | Payer: Medicaid Other | Attending: Emergency Medicine | Admitting: Emergency Medicine

## 2013-05-17 ENCOUNTER — Telehealth (HOSPITAL_BASED_OUTPATIENT_CLINIC_OR_DEPARTMENT_OTHER): Payer: Self-pay | Admitting: *Deleted

## 2013-05-17 ENCOUNTER — Encounter (HOSPITAL_COMMUNITY): Payer: Self-pay | Admitting: Emergency Medicine

## 2013-05-17 DIAGNOSIS — I10 Essential (primary) hypertension: Secondary | ICD-10-CM | POA: Insufficient documentation

## 2013-05-17 DIAGNOSIS — R112 Nausea with vomiting, unspecified: Secondary | ICD-10-CM | POA: Insufficient documentation

## 2013-05-17 DIAGNOSIS — IMO0002 Reserved for concepts with insufficient information to code with codable children: Secondary | ICD-10-CM | POA: Insufficient documentation

## 2013-05-17 DIAGNOSIS — Z880 Allergy status to penicillin: Secondary | ICD-10-CM | POA: Insufficient documentation

## 2013-05-17 DIAGNOSIS — M069 Rheumatoid arthritis, unspecified: Secondary | ICD-10-CM | POA: Insufficient documentation

## 2013-05-17 DIAGNOSIS — H9209 Otalgia, unspecified ear: Secondary | ICD-10-CM | POA: Insufficient documentation

## 2013-05-17 DIAGNOSIS — F17200 Nicotine dependence, unspecified, uncomplicated: Secondary | ICD-10-CM | POA: Insufficient documentation

## 2013-05-17 DIAGNOSIS — J329 Chronic sinusitis, unspecified: Secondary | ICD-10-CM

## 2013-05-17 DIAGNOSIS — Z79899 Other long term (current) drug therapy: Secondary | ICD-10-CM | POA: Insufficient documentation

## 2013-05-17 LAB — RAPID STREP SCREEN (MED CTR MEBANE ONLY): Streptococcus, Group A Screen (Direct): NEGATIVE

## 2013-05-17 MED ORDER — AZITHROMYCIN 250 MG PO TABS: 250.0000 mg | ORAL_TABLET | Freq: Every day | ORAL | Status: DC

## 2013-05-17 MED ORDER — MAGIC MOUTHWASH: 5.0000 mL | Freq: Three times a day (TID) | ORAL | Status: DC | PRN

## 2013-05-17 MED ORDER — TRAMADOL HCL 50 MG PO TABS: 50.0000 mg | ORAL_TABLET | Freq: Four times a day (QID) | ORAL | Status: DC | PRN

## 2013-05-17 MED ORDER — KETOROLAC TROMETHAMINE 60 MG/2ML IM SOLN
60.0000 mg | Freq: Once | INTRAMUSCULAR | Status: AC
  Administered 2013-05-17: 60 mg via INTRAMUSCULAR
  Filled 2013-05-17: qty 2

## 2013-05-17 MED ORDER — MAGIC MOUTHWASH
5.0000 mL | Freq: Once | ORAL | Status: AC
  Administered 2013-05-17: 5 mL via ORAL
  Filled 2013-05-17: qty 5

## 2013-05-17 MED ORDER — NAPROXEN 500 MG PO TABS: 500.0000 mg | ORAL_TABLET | Freq: Two times a day (BID) | ORAL | Status: DC

## 2013-05-17 MED ORDER — TRAMADOL HCL 50 MG PO TABS
50.0000 mg | ORAL_TABLET | Freq: Once | ORAL | Status: AC
  Administered 2013-05-17: 50 mg via ORAL
  Filled 2013-05-17: qty 1

## 2013-05-17 NOTE — ED Notes (Signed)
Pt states she having sore throat and bil ear pain, states been going on x 3 weeks on and off.

## 2013-05-17 NOTE — ED Provider Notes (Signed)
CSN: [id number]     Arrival date & time 05/17/13  1225 History   This chart was scribed for Noland Fordyce, PA working with Jasper Riling. Alvino Chapel, MD, by Delphia Grates, ED Scribe. This patient was seen in room [place] and the patient's care was started at 1:19 PM.    Chief Complaint  Patient presents with  . Sore Throat  . Otalgia     The history is provided by the patient. No language interpreter was used.   HPI Comments: [name] is a 58 y.o. female who presents to the Emergency Department complaining of intermittent sore throat that began 3 weeks ago. There is associated cough, nausea, 5 episodes of emesis, HA, and bilateral ear pain. She denies fever. Patient confirms sick contact 2 weeks ago. Patient states she has taken Aleve with no relief. She denies any modifying factors. Patient is allergic to PCN.   Past Medical History  Diagnosis Date  . Active smoker   . Rheumatoid arthritis 1  . Hypertension    Past Surgical History  Procedure Laterality Date  . Esophagogastroduodenoscopy N/A 10/10/2012    Procedure: ESOPHAGOGASTRODUODENOSCOPY (EGD);  Surgeon: [name], MD;  Location: [facility];  Service: Endoscopy;  Laterality: N/A;   No family history on file. History  Substance Use Topics  . Smoking status: Current Every Day Smoker -- 0.50 packs/day    Types: Cigarettes  . Smokeless tobacco: Not on file  . Alcohol Use: No   OB History   Grav Para Term Preterm Abortions TAB SAB Ect Mult Living                 Review of Systems  Constitutional: Negative for fever.  HENT: Positive for ear pain and sore throat.   Respiratory: Positive for cough.   Gastrointestinal: Positive for nausea and vomiting.  Neurological: Positive for headaches.      Allergies  Iohexol and Penicillins  Home Medications   Prior to Admission medications   Medication Sig Start Date End Date Taking? Authorizing Provider  albuterol (PROVENTIL HFA;VENTOLIN HFA) 108 (90 BASE)  MCG/ACT inhaler Inhale 2 puffs into the lungs every 6 (six) hours as needed for wheezing or shortness of breath.   Yes Historical Provider, MD  ibuprofen (ADVIL,MOTRIN) 800 MG tablet Take 800 mg by mouth every 8 (eight) hours as needed for moderate pain.   Yes Historical Provider, MD  lisinopril-hydrochlorothiazide (PRINZIDE,ZESTORETIC) 20-12.5 MG per tablet Take 1 tablet by mouth every morning.    Yes Historical Provider, MD  naproxen sodium (ANAPROX) 220 MG tablet Take 440 mg by mouth 2 (two) times daily as needed (pain).    Yes Historical Provider, MD  predniSONE (DELTASONE) 10 MG tablet Take 1 tablet (10 mg total) by mouth daily with breakfast. 02/15/13  Yes Theodis Blaze, MD   Triage Vitals: BP 136/69  Pulse 90  Temp(Src) 98.6 F (37 C) (Oral)  Resp 18  Ht 5' 6.5" (1.689 m)  Wt 162 lb (73.483 kg)  BMI 25.76 kg/m2  SpO2 97%  Physical Exam  Nursing note and vitals reviewed. Constitutional: She is oriented to person, place, and time. She appears well-developed and well-nourished. No distress.  HENT:  Head: Normocephalic and atraumatic.  Right Ear: Hearing, tympanic membrane, external ear and ear canal normal.  Left Ear: Hearing, tympanic membrane, external ear and ear canal normal.  Nose: Mucosal edema present.  Mouth/Throat: Uvula is midline and mucous membranes are normal. Posterior oropharyngeal erythema present. No oropharyngeal exudate, posterior oropharyngeal edema  or tonsillar abscesses.  Eyes: Conjunctivae and EOM are normal. No scleral icterus.  Neck: Normal range of motion. Neck supple.  Cardiovascular: Normal rate, regular rhythm and normal heart sounds.   Pulmonary/Chest: Effort normal and breath sounds normal. No respiratory distress. She has no wheezes. She has no rales. She exhibits no tenderness.  No respiratory distress, able to speak in full sentences w/o difficulty. Lungs: CTAB  Abdominal: Soft. Bowel sounds are normal. She exhibits no distension and no mass. There is  no tenderness. There is no rebound and no guarding.  Musculoskeletal: Normal range of motion.  Lymphadenopathy:    She has no cervical adenopathy.  Neurological: She is alert and oriented to person, place, and time.  Skin: Skin is warm and dry. She is not diaphoretic.  Psychiatric: She has a normal mood and affect. Her behavior is normal.    ED Course  Procedures (including critical care time)  DIAGNOSTIC STUDIES: Oxygen Saturation is 97% on room air, adequate by my interpretation.    COORDINATION OF CARE: At 1345 Discussed treatment plan with patient which includes azithromycin. Patient agrees.    Labs Review Labs Reviewed  RAPID STREP SCREEN  CULTURE, GROUP A STREP    Imaging Review No results found.   EKG Interpretation None      MDM   Final diagnoses:  Sinusitis    Pt c/o sinus congestion and bilateral ear pain with sore throat x3 weeks. Will tx for sinusitis due to duration of symptoms. Advised to f/u with PCP. Return precautions provided. Pt verbalized understanding and agreement with tx plan.   I personally performed the services described in this documentation, which was scribed in my presence. The recorded information has been reviewed and is accurate.    Noland Fordyce, PA-C 05/18/13 2204

## 2013-05-17 NOTE — Telephone Encounter (Signed)
Called in a magic mouthwash of Benadryl, Nystatin, & Lidocaine (1x1x1) due to pharmacists wanting clairifcation

## 2013-05-17 NOTE — Discharge Instructions (Signed)
Sinusitis Sinusitis is redness, soreness, and puffiness (inflammation) of the air pockets in the bones of your face (sinuses). The redness, soreness, and puffiness can cause air and mucus to get trapped in your sinuses. This can allow germs to grow and cause an infection.  HOME CARE   Drink enough fluids to keep your pee (urine) clear or pale yellow.  Use a humidifier in your home.  Run a hot shower to create steam in the bathroom. Sit in the bathroom with the door closed. Breathe in the steam 3 4 times a day.  Put a warm, moist washcloth on your face 3 4 times a day, or as told by your doctor.  Use salt water sprays (saline sprays) to wet the thick fluid in your nose. This can help the sinuses drain.  Only take medicine as told by your doctor. GET HELP RIGHT AWAY IF:   Your pain gets worse.  You have very bad headaches.  You are sick to your stomach (nauseous).  You throw up (vomit).  You are very sleepy (drowsy) all the time.  Your face is puffy (swollen).  Your vision changes.  You have a stiff neck.  You have trouble breathing. MAKE SURE YOU:   Understand these instructions.  Will watch your condition.  Will get help right away if you are not doing well or get worse. Document Released: 06/16/2007 Document Revised: 09/22/2011 Document Reviewed: 08/03/2011 [employer] Patient Information 2014 [employer].  

## 2013-05-19 LAB — CULTURE, GROUP A STREP

## 2013-05-22 NOTE — ED Provider Notes (Signed)
Medical screening examination/treatment/procedure(s) were performed by non-physician practitioner and as supervising physician I was immediately available for consultation/collaboration.   EKG Interpretation None       Jasper Riling. Alvino Chapel, MD 05/22/13 0030

## 2013-05-28 ENCOUNTER — Encounter (HOSPITAL_COMMUNITY): Payer: Self-pay | Admitting: Emergency Medicine

## 2013-05-28 ENCOUNTER — Emergency Department (HOSPITAL_COMMUNITY): Payer: Medicaid Other

## 2013-05-28 ENCOUNTER — Emergency Department (HOSPITAL_COMMUNITY)
Admission: EM | Admit: 2013-05-28 | Discharge: 2013-05-28 | Disposition: A | Discharge status: Home / Self Care | Payer: Medicaid Other | Attending: Emergency Medicine | Admitting: Emergency Medicine

## 2013-05-28 DIAGNOSIS — J329 Chronic sinusitis, unspecified: Secondary | ICD-10-CM | POA: Insufficient documentation

## 2013-05-28 DIAGNOSIS — Z791 Long term (current) use of non-steroidal anti-inflammatories (NSAID): Secondary | ICD-10-CM | POA: Insufficient documentation

## 2013-05-28 DIAGNOSIS — I10 Essential (primary) hypertension: Secondary | ICD-10-CM | POA: Insufficient documentation

## 2013-05-28 DIAGNOSIS — F17200 Nicotine dependence, unspecified, uncomplicated: Secondary | ICD-10-CM | POA: Insufficient documentation

## 2013-05-28 DIAGNOSIS — Z8719 Personal history of other diseases of the digestive system: Secondary | ICD-10-CM | POA: Insufficient documentation

## 2013-05-28 DIAGNOSIS — IMO0002 Reserved for concepts with insufficient information to code with codable children: Secondary | ICD-10-CM | POA: Insufficient documentation

## 2013-05-28 DIAGNOSIS — Z79899 Other long term (current) drug therapy: Secondary | ICD-10-CM | POA: Insufficient documentation

## 2013-05-28 DIAGNOSIS — M069 Rheumatoid arthritis, unspecified: Secondary | ICD-10-CM | POA: Insufficient documentation

## 2013-05-28 DIAGNOSIS — R52 Pain, unspecified: Secondary | ICD-10-CM | POA: Insufficient documentation

## 2013-05-28 DIAGNOSIS — Z880 Allergy status to penicillin: Secondary | ICD-10-CM | POA: Insufficient documentation

## 2013-05-28 DIAGNOSIS — J45901 Unspecified asthma with (acute) exacerbation: Secondary | ICD-10-CM | POA: Insufficient documentation

## 2013-05-28 MED ORDER — MAGIC MOUTHWASH
10.0000 mL | Freq: Once | ORAL | Status: AC
  Administered 2013-05-28: 10 mL via ORAL
  Filled 2013-05-28: qty 10

## 2013-05-28 MED ORDER — BENZOCAINE-MENTHOL 15-2.6 MG MT LOZG: 1.0000 | LOZENGE | Freq: Four times a day (QID) | OROMUCOSAL | Status: DC | PRN

## 2013-05-28 MED ORDER — SULFAMETHOXAZOLE-TRIMETHOPRIM 800-160 MG PO TABS: 1.0000 | ORAL_TABLET | Freq: Two times a day (BID) | ORAL | Status: DC

## 2013-05-28 MED ORDER — IPRATROPIUM BROMIDE 0.02 % IN SOLN
0.5000 mg | Freq: Once | RESPIRATORY_TRACT | Status: AC
  Administered 2013-05-28: 0.5 mg via RESPIRATORY_TRACT
  Filled 2013-05-28: qty 2.5

## 2013-05-28 MED ORDER — ALBUTEROL (5 MG/ML) CONTINUOUS INHALATION SOLN
10.0000 mg | INHALATION_SOLUTION | RESPIRATORY_TRACT | Status: AC
  Administered 2013-05-28: 10 mg via RESPIRATORY_TRACT
  Filled 2013-05-28: qty 20

## 2013-05-28 NOTE — ED Provider Notes (Signed)
CSN: [id number]     Arrival date & time 05/28/13  0803 History   First MD Initiated Contact with Patient 05/28/13 0805     Chief Complaint  Patient presents with  . Asthma     (Consider location/radiation/quality/duration/timing/severity/associated sxs/prior Treatment) HPI Comments: Patient is a 58 year old female with a past medical history of asthma, cocaine abuse, pancreatic mass, and RA who presents with wheezing and SOB that started this morning. Symptoms started suddenly and remained constant. Patient also complains of a 2 week history of productive cough, sore throat, and generalized body aches. She was seen 10 days ago in the ED and was given azithromycin, pain medication, and magic mouthwash. These interventions did not improve her symptoms. She denies any other associated symptoms.    Past Medical History  Diagnosis Date  . Active smoker   . Rheumatoid arthritis 1  . Hypertension    Past Surgical History  Procedure Laterality Date  . Esophagogastroduodenoscopy N/A 10/10/2012    Procedure: ESOPHAGOGASTRODUODENOSCOPY (EGD);  Surgeon: [name], MD;  Location: [facility];  Service: Endoscopy;  Laterality: N/A;   No family history on file. History  Substance Use Topics  . Smoking status: Current Every Day Smoker -- 0.50 packs/day    Types: Cigarettes  . Smokeless tobacco: Not on file  . Alcohol Use: No   OB History   Grav Para Term Preterm Abortions TAB SAB Ect Mult Living                 Review of Systems  Constitutional: Negative for fever, chills and fatigue.  HENT: Positive for congestion and sinus pressure. Negative for trouble swallowing.   Eyes: Negative for visual disturbance.  Respiratory: Positive for cough, shortness of breath and wheezing.   Cardiovascular: Negative for chest pain and palpitations.  Gastrointestinal: Negative for nausea, vomiting, abdominal pain and diarrhea.  Genitourinary: Negative for dysuria and difficulty urinating.   Musculoskeletal: Positive for myalgias. Negative for arthralgias and neck pain.  Skin: Negative for color change.  Neurological: Negative for dizziness and weakness.  Psychiatric/Behavioral: Negative for dysphoric mood.      Allergies  Iohexol and Penicillins  Home Medications   Prior to Admission medications   Medication Sig Start Date End Date Taking? Authorizing Provider  albuterol (PROVENTIL HFA;VENTOLIN HFA) 108 (90 BASE) MCG/ACT inhaler Inhale 2 puffs into the lungs every 6 (six) hours as needed for wheezing or shortness of breath.   Yes Historical Provider, MD  ibuprofen (ADVIL,MOTRIN) 800 MG tablet Take 800 mg by mouth every 8 (eight) hours as needed for moderate pain.   Yes Historical Provider, MD  lisinopril-hydrochlorothiazide (PRINZIDE,ZESTORETIC) 20-12.5 MG per tablet Take 1 tablet by mouth every morning.    Yes Historical Provider, MD  naproxen (NAPROSYN) 500 MG tablet Take 1 tablet (500 mg total) by mouth 2 (two) times daily with a meal. 05/17/13  Yes Noland Fordyce, PA-C  naproxen sodium (ANAPROX) 220 MG tablet Take 440 mg by mouth 2 (two) times daily as needed (pain).    Yes Historical Provider, MD  predniSONE (DELTASONE) 10 MG tablet Take 1 tablet (10 mg total) by mouth daily with breakfast. 02/15/13  Yes Theodis Blaze, MD  traMADol (ULTRAM) 50 MG tablet Take 1 tablet (50 mg total) by mouth every 6 (six) hours as needed. 05/17/13  Yes Noland Fordyce, PA-C  Alum & Mag Hydroxide-Simeth (MAGIC MOUTHWASH) SOLN Take 5 mLs by mouth 3 (three) times daily as needed for mouth pain. 05/17/13   Junie Panning  O'Malley, PA-C   BP 151/75  Temp(Src) 97.9 F (36.6 C) (Oral)  Resp 16  SpO2 95% Physical Exam  Nursing note and vitals reviewed. Constitutional: She is oriented to person, place, and time. She appears well-developed and well-nourished. No distress.  HENT:  Head: Normocephalic and atraumatic.  Mouth/Throat: Oropharynx is clear and moist. No oropharyngeal exudate.  Maxillary sinus  tenderness to palpation.   Eyes: Conjunctivae and EOM are normal. Pupils are equal, round, and reactive to light.  Neck: Normal range of motion.  Cardiovascular: Normal rate and regular rhythm.  Exam reveals no gallop and no friction rub.   No murmur heard. Pulmonary/Chest: Effort normal. She has wheezes. She has no rales. She exhibits no tenderness.  diminished lung sounds at bilateral bases. Wheezing noted in bilateral upper lung fields.   Abdominal: Soft. She exhibits no distension. There is no tenderness. There is no rebound and no guarding.  Musculoskeletal: Normal range of motion.  Lymphadenopathy:    She has no cervical adenopathy.  Neurological: She is alert and oriented to person, place, and time. Coordination normal.  Speech is goal-oriented. Moves limbs without ataxia.   Skin: Skin is warm and dry.  Psychiatric: She has a normal mood and affect. Her behavior is normal.    ED Course  Procedures (including critical care time) Labs Review Labs Reviewed - No data to display  Imaging Review Dg Chest 2 View  05/28/2013   CLINICAL DATA:  Cough and congestion. Sore throat with chest tightness. History of hypertension and smoking.  EXAM: CHEST  2 VIEW  COMPARISON:  02/14/2013 and 10/26/2012 radiographs.  FINDINGS: The heart size and mediastinal contours are stable. There is stable aortic atherosclerosis. The lungs are clear. Old rib fracture is noted on the left. There is a mild scoliosis. No acute osseous findings are evident.  IMPRESSION: No active cardiopulmonary process.   Electronically Signed   By: Camie Patience M.D.   On: 05/28/2013 08:36     EKG Interpretation None      MDM   Final diagnoses:  Sinusitis  Asthma exacerbation    9:11 AM Chest xray unremarkable for acute changes. Vitals stable and patient afebrile. Patient reports some relief after initial nebulizer treatment. Patient will have continuous nebulizer treatment.   10:38 AM Patient's lung sounds have  improved after continuous nebulizer. I will prescribe Bactrim for the patient's sinusitis since the azithromycin did not seem to improve her sinus pressure and congestion. Patient will have Cepacol lozenges for sore throat. No further evaluation needed at this time.     Alvina Chou, PA-C 05/28/13 1045

## 2013-05-28 NOTE — ED Notes (Signed)
Bed: WA17 Expected date:  Expected time:  Means of arrival:  Comments: ems- resp distress, asthma

## 2013-05-28 NOTE — ED Notes (Addendum)
Per EMS pt complaint of asthma with productive cough and generalized body aches for 2 weeks. Pt given 5 mg of albuterol treatment en route with EMS to hospital.

## 2013-05-28 NOTE — Discharge Instructions (Signed)
Take Bactrim as directed until gone. Use Cepacol lozenges as needed for sore throat. Refer to attached documents for more information. Follow up with your doctor for further evaluation.

## 2013-05-28 NOTE — ED Provider Notes (Signed)
Medical screening examination/treatment/procedure(s) were performed by non-physician practitioner and as supervising physician I was immediately available for consultation/collaboration.   EKG Interpretation None       Virgel Manifold, MD 05/28/13 1048

## 2013-05-28 NOTE — ED Notes (Signed)
Pt reports would like something for sore throat. PA notified.

## 2013-05-28 NOTE — ED Notes (Signed)
Respiratory at bedside.

## 2013-05-28 NOTE — ED Notes (Signed)
Pt transported to xray at present time. Respiratory called and reports will be down to see patient and administer continuous neb treatment.

## 2013-06-04 IMAGING — CR DG CHEST 2V
2 series · 2 of 2 positions shown · non-contrast
Comparison: 10/21/2010

CLINICAL DATA: Shortness of breath and coughing

CHEST - 2 VIEW

[w chest pa]
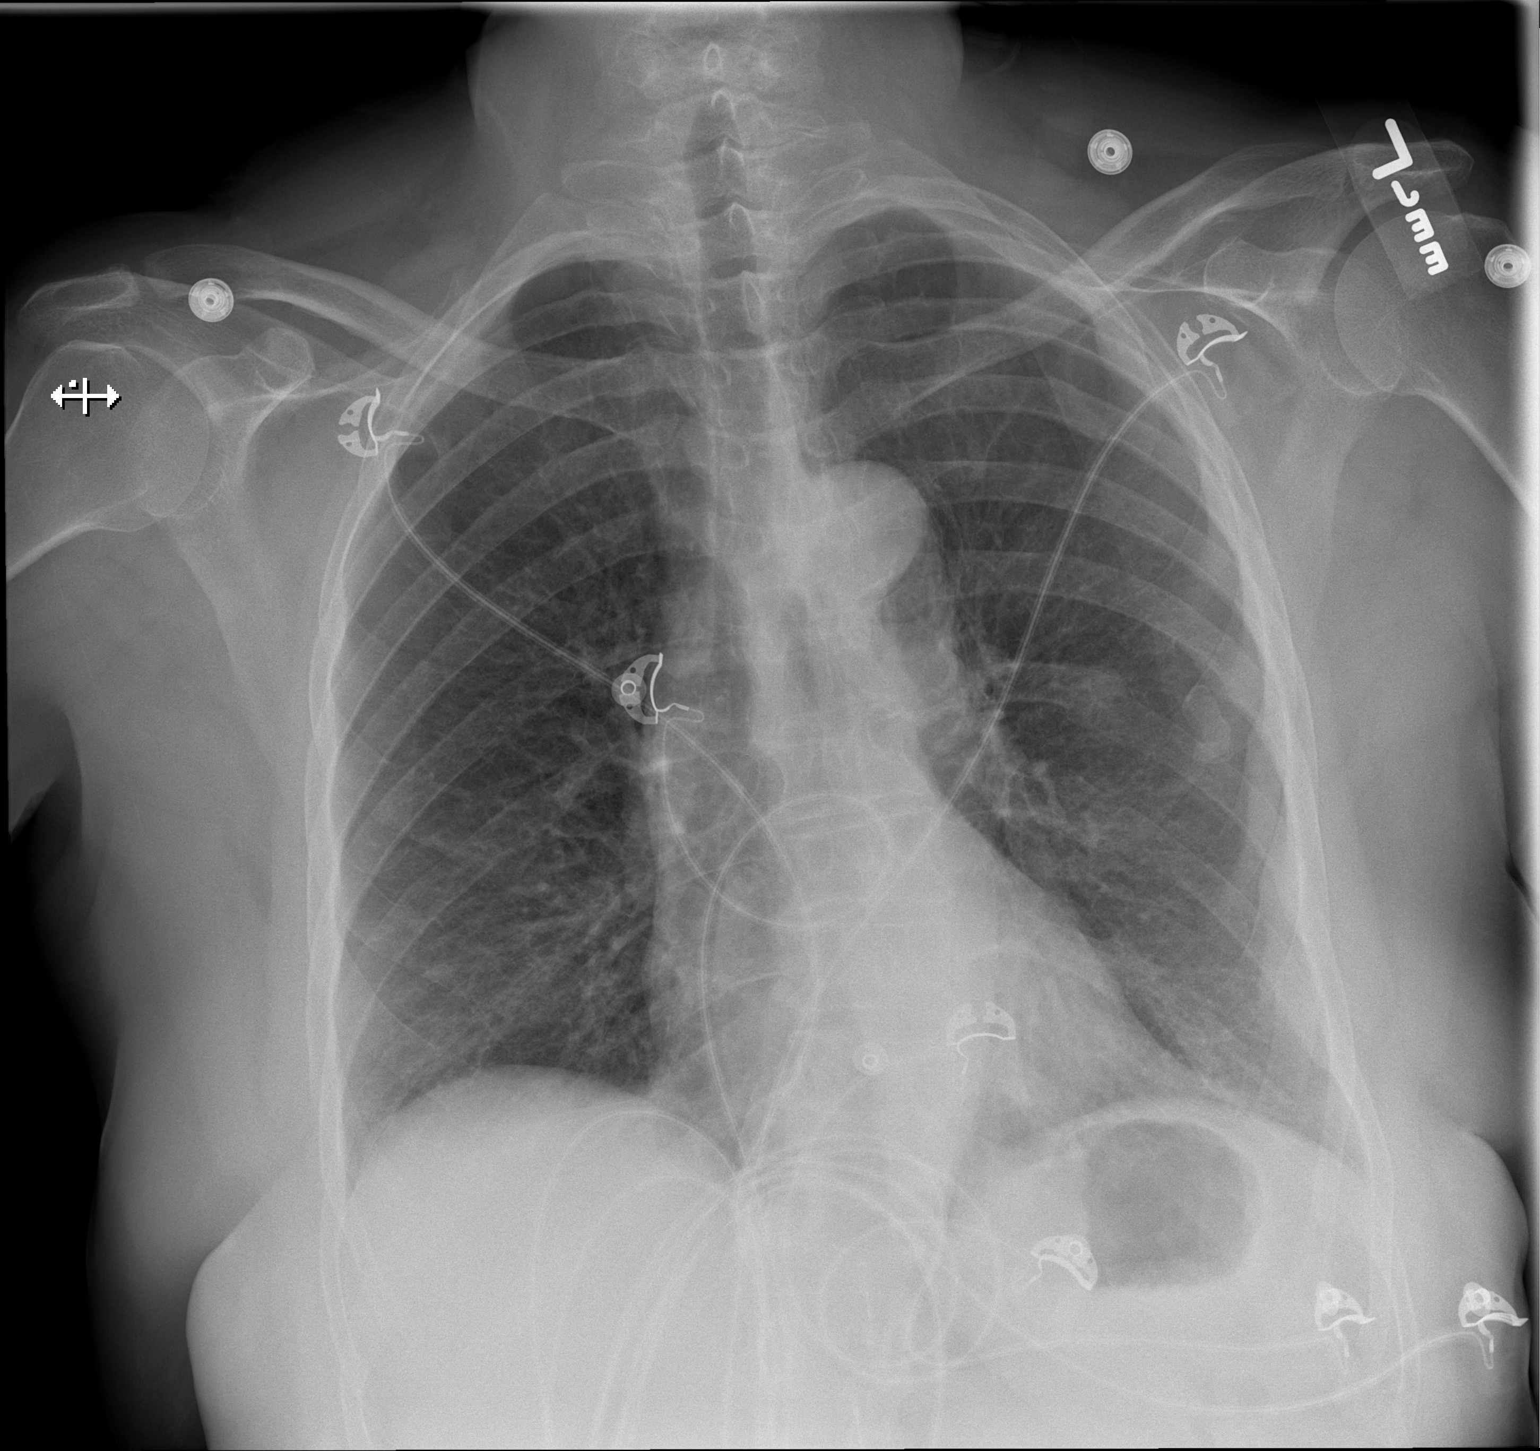

[w chest lat]
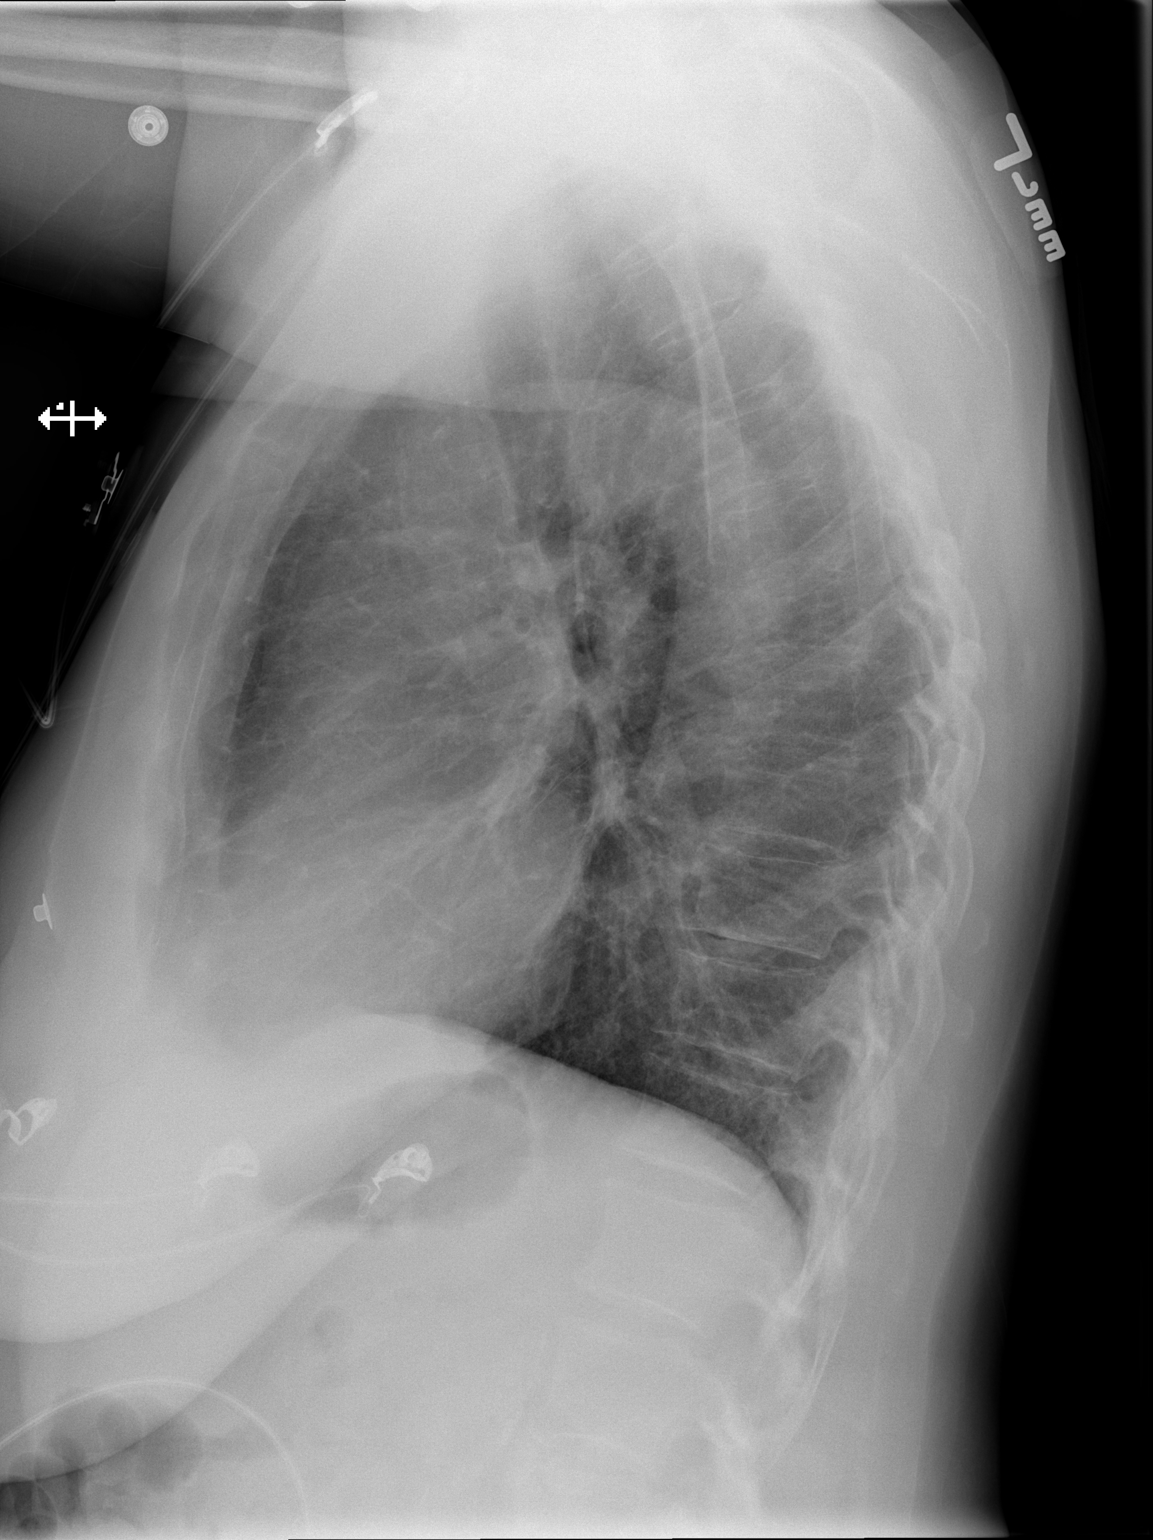

[2 of 2 positions shown; findings below may reference images not displayed]

FINDINGS: Heart size is normal.  No pleural effusion or edema.  No
airspace consolidation identified.  Chronic appearing bronchitic
changes are noted.

There are old left posterior rib deformities, similar to previous
exam.
IMPRESSION: 1.  No acute findings.
2.  Chronic bronchitic change.

## 2013-06-05 ENCOUNTER — Encounter (HOSPITAL_COMMUNITY): Payer: Self-pay | Admitting: Emergency Medicine

## 2013-06-05 ENCOUNTER — Emergency Department (HOSPITAL_COMMUNITY)
Admission: EM | Admit: 2013-06-05 | Discharge: 2013-06-06 | Disposition: A | Discharge status: Home / Self Care | Payer: Medicaid Other | Attending: Emergency Medicine | Admitting: Emergency Medicine

## 2013-06-05 DIAGNOSIS — M069 Rheumatoid arthritis, unspecified: Secondary | ICD-10-CM

## 2013-06-05 DIAGNOSIS — Z79899 Other long term (current) drug therapy: Secondary | ICD-10-CM | POA: Insufficient documentation

## 2013-06-05 DIAGNOSIS — Z87828 Personal history of other (healed) physical injury and trauma: Secondary | ICD-10-CM | POA: Insufficient documentation

## 2013-06-05 DIAGNOSIS — Z880 Allergy status to penicillin: Secondary | ICD-10-CM | POA: Insufficient documentation

## 2013-06-05 DIAGNOSIS — F17200 Nicotine dependence, unspecified, uncomplicated: Secondary | ICD-10-CM | POA: Insufficient documentation

## 2013-06-05 DIAGNOSIS — I10 Essential (primary) hypertension: Secondary | ICD-10-CM | POA: Insufficient documentation

## 2013-06-05 DIAGNOSIS — J029 Acute pharyngitis, unspecified: Secondary | ICD-10-CM

## 2013-06-05 DIAGNOSIS — Z791 Long term (current) use of non-steroidal anti-inflammatories (NSAID): Secondary | ICD-10-CM | POA: Insufficient documentation

## 2013-06-05 HISTORY — DX: Unspecified firearm discharge, undetermined intent, initial encounter: Y24.9XXA

## 2013-06-05 HISTORY — DX: Accidental discharge from unspecified firearms or gun, initial encounter: W34.00XA

## 2013-06-05 MED ORDER — DEXAMETHASONE SODIUM PHOSPHATE 10 MG/ML IJ SOLN
10.0000 mg | Freq: Once | INTRAMUSCULAR | Status: AC
  Administered 2013-06-06: 10 mg via INTRAMUSCULAR
  Filled 2013-06-05: qty 1

## 2013-06-05 MED ORDER — OXYCODONE-ACETAMINOPHEN 5-325 MG PO TABS
1.0000 | ORAL_TABLET | Freq: Once | ORAL | Status: AC
  Administered 2013-06-06: 1 via ORAL
  Filled 2013-06-05: qty 1

## 2013-06-05 NOTE — ED Notes (Signed)
Patient is alert and oriented x3.  She is complaining of a soar throat with Arthritis pain.  Currently she rates her pain 10 of 10.  Patient states that  She has ran out of her pain medication.

## 2013-06-05 NOTE — ED Notes (Signed)
Pt presents with c/o bilateral knee pain and a sore throat. Pt reported to EMS that she is unable to ambulate on her own, able to stand and sit on stretcher for EMS with some assistance.

## 2013-06-06 MED ORDER — OXYCODONE-ACETAMINOPHEN 5-325 MG PO TABS: 1.0000 | ORAL_TABLET | Freq: Four times a day (QID) | ORAL | Status: DC | PRN

## 2013-06-06 MED ORDER — ONDANSETRON 8 MG PO TBDP
8.0000 mg | ORAL_TABLET | Freq: Once | ORAL | Status: AC
  Administered 2013-06-06: 8 mg via ORAL
  Filled 2013-06-06: qty 1

## 2013-06-06 MED ORDER — PREDNISONE 50 MG PO TABS: 50.0000 mg | ORAL_TABLET | Freq: Every day | ORAL | Status: DC

## 2013-06-06 MED ORDER — GUAIFENESIN ER 1200 MG PO TB12: 1.0000 | ORAL_TABLET | Freq: Two times a day (BID) | ORAL | Status: DC

## 2013-06-06 NOTE — Discharge Instructions (Signed)
Return here as needed.  Followup with the, ear, nose, and throat doctor provided.  Followup with your primary-care Dr. as well
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
## 2013-06-06 NOTE — ED Notes (Signed)
Pt given warm blankets and taken to the lobby in a wheelchair at this time

## 2013-06-06 NOTE — ED Provider Notes (Signed)
Medical screening examination/treatment/procedure(s) were performed by non-physician practitioner and as supervising physician I was immediately available for consultation/collaboration.   EKG Interpretation None       [name], MD 06/06/13 [id number]

## 2013-06-06 NOTE — ED Notes (Signed)
Pt continues to call for a ride while getting changed.

## 2013-08-08 ENCOUNTER — Emergency Department (HOSPITAL_COMMUNITY)
Admission: EM | Admit: 2013-08-08 | Discharge: 2013-08-08 | Disposition: A | Discharge status: Home / Self Care | Payer: Medicaid Other | Attending: Emergency Medicine | Admitting: Emergency Medicine

## 2013-08-08 ENCOUNTER — Encounter (HOSPITAL_COMMUNITY): Payer: Self-pay | Admitting: Emergency Medicine

## 2013-08-08 DIAGNOSIS — M25569 Pain in unspecified knee: Secondary | ICD-10-CM | POA: Diagnosis present

## 2013-08-08 DIAGNOSIS — Z87828 Personal history of other (healed) physical injury and trauma: Secondary | ICD-10-CM | POA: Insufficient documentation

## 2013-08-08 DIAGNOSIS — I10 Essential (primary) hypertension: Secondary | ICD-10-CM | POA: Diagnosis not present

## 2013-08-08 DIAGNOSIS — Z880 Allergy status to penicillin: Secondary | ICD-10-CM | POA: Diagnosis not present

## 2013-08-08 DIAGNOSIS — M069 Rheumatoid arthritis, unspecified: Secondary | ICD-10-CM | POA: Insufficient documentation

## 2013-08-08 DIAGNOSIS — F17200 Nicotine dependence, unspecified, uncomplicated: Secondary | ICD-10-CM | POA: Diagnosis not present

## 2013-08-08 DIAGNOSIS — Z791 Long term (current) use of non-steroidal anti-inflammatories (NSAID): Secondary | ICD-10-CM | POA: Diagnosis not present

## 2013-08-08 DIAGNOSIS — Z79899 Other long term (current) drug therapy: Secondary | ICD-10-CM | POA: Insufficient documentation

## 2013-08-08 MED ORDER — PREDNISONE 20 MG PO TABS: 40.0000 mg | ORAL_TABLET | Freq: Every day | ORAL | Status: DC

## 2013-08-08 MED ORDER — OXYCODONE-ACETAMINOPHEN 5-325 MG PO TABS
2.0000 | ORAL_TABLET | Freq: Once | ORAL | Status: AC
  Administered 2013-08-08: 2 via ORAL
  Filled 2013-08-08: qty 2

## 2013-08-08 MED ORDER — OXYCODONE-ACETAMINOPHEN 5-325 MG PO TABS: 2.0000 | ORAL_TABLET | ORAL | Status: DC | PRN

## 2013-08-08 NOTE — ED Notes (Signed)
Pt c/o bilateral knee pain d/t arthritis.  States that her appt isn't until the 14th of next month.

## 2013-08-08 NOTE — Discharge Instructions (Signed)
Take prednisone for 5 days  Take percocet for severe pain - Please be careful with this medication.  It can cause drowsiness.  Use caution while driving, operating machinery, drinking alcohol, or any other activities that may impair your physical or mental abilities.   Use RICE method - see below  Return to the emergency department if you develop any changing/worsening condition, fever, joint swelling/redness, or any other concerns (please read additional information regarding your condition below)    Rheumatoid Arthritis Rheumatoid arthritis is a long-term (chronic) inflammatory disease that causes pain, swelling, and stiffness of the joints. It can affect the entire body, including the eyes and lungs. The effects of rheumatoid arthritis vary widely among those with the condition. CAUSES  The cause of rheumatoid arthritis is not known. It tends to run in families and is more common in women. Certain cells of the body's natural defense system (immune system) do not work properly and begin to attack healthy joints. It primarily involves the connective tissue that lines the joints (synovial membrane). This can cause damage to the joint. SYMPTOMS   Pain, stiffness, swelling, and decreased motion of many joints, especially in the hands and feet.  Stiffness that is worse in the morning. It may last 1-2 hours or longer.  Numbness and tingling in the hands.  Fatigue.  Loss of appetite.  Weight loss.  Low-grade fever.  Dry eyes and mouth.  Firm lumps (rheumatoid nodules) that grow beneath the skin in areas such as the elbows and hands. DIAGNOSIS  Diagnosis is based on the symptoms described, an exam, and blood tests. Sometimes, X-rays are helpful. TREATMENT  The goals of treatment are to relieve pain, reduce inflammation, and to slow down or stop joint damage and disability. Methods vary and may include:  Maintaining a balance of rest, exercise, and proper nutrition.  Medicines:  Pain  relievers (analgesics).  Corticosteroids and nonsteroidal anti-inflammatory drugs (NSAIDs) to reduce inflammation.  Disease-modifying antirheumatic drugs (DMARDs) to try to slow the course of the disease.  Biologic response modifiers to reduce inflammation and damage.  Physical therapy and occupational therapy.  Surgery for patients with severe joint damage. Joint replacement or fusing of joints may be needed.  Routine monitoring and ongoing care, such as office visits, blood and urine tests, and X-rays. HOME CARE INSTRUCTIONS   Remain physically active and reduce activity when the disease gets worse.  Eat a well-balanced diet.  Put heat on affected joints when you wake up and before activities. Keep the heat on the affected joint for as long as directed by your health care provider.  Put ice on affected joints following activities or exercising.  Put ice in a plastic bag.  Place a towel between your skin and the bag.  Leave the ice on for 15-20 minutes, 3-4 times per day, or as directed by your health care provider.  Take medicines and supplements only as directed by your health care provider.  Use splints as directed by your health care provider. Splints help maintain joint position and function.  Do not sleep with pillows under your knees. This may lead to spasms.  Participate in a self-management program to keep current with the latest treatment and coping skills. SEEK IMMEDIATE MEDICAL CARE IF:  You have fainting episodes.  You have periods of extreme weakness.  You rapidly develop a hot, painful joint that is more severe than usual joint aches.  You have chills.  You have a fever. FOR MORE INFORMATION   [employer]  [employer]: www.rheumatology.[place]: www.arthritis.org Document Released: 12/26/1999 Document Revised: 05/14/2013 Document Reviewed: 02/03/2011 [facility] Patient Information 2015 [place], [place]. This information is not  intended to replace advice given to you by your health care provider. Make sure you discuss any questions you have with your health care provider.  RICE: Routine Care for Injuries The routine care of many injuries includes Rest, Ice, Compression, and Elevation (RICE). HOME CARE INSTRUCTIONS  Rest is needed to allow your body to heal. Routine activities can usually be resumed when comfortable. Injured tendons and bones can take up to 6 weeks to heal. Tendons are the cord-like structures that attach muscle to bone.  Ice following an injury helps keep the swelling down and reduces pain.  Put ice in a plastic bag.  Place a towel between your skin and the bag.  Leave the ice on for 15-20 minutes, 3-4 times a day, or as directed by your health care provider. Do this while awake, for the first 24 to 48 hours. After that, continue as directed by your caregiver.  Compression helps keep swelling down. It also gives support and helps with discomfort. If an elastic bandage has been applied, it should be removed and reapplied every 3 to 4 hours. It should not be applied tightly, but firmly enough to keep swelling down. Watch fingers or toes for swelling, bluish discoloration, coldness, numbness, or excessive pain. If any of these problems occur, remove the bandage and reapply loosely. Contact your caregiver if these problems continue.  Elevation helps reduce swelling and decreases pain. With extremities, such as the arms, hands, legs, and feet, the injured area should be placed near or above the level of the heart, if possible. SEEK IMMEDIATE MEDICAL CARE IF:  You have persistent pain and swelling.  You develop redness, numbness, or unexpected weakness.  Your symptoms are getting worse rather than improving after several days. These symptoms may indicate that further evaluation or further X-rays are needed. Sometimes, X-rays may not show a small broken bone (fracture) until 1 week or 10 days later. Make  a follow-up appointment with your caregiver. Ask when your X-ray results will be ready. Make sure you get your X-ray results. Document Released: 04/11/2000 Document Revised: 01/02/2013 Document Reviewed: 05/29/2010 [facility] Patient Information 2015 [place], [place]. This information is not intended to replace advice given to you by your health care provider. Make sure you discuss any questions you have with your health care provider.
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
## 2013-08-10 NOTE — ED Provider Notes (Signed)
Medical screening examination/treatment/procedure(s) were performed by non-physician practitioner and as supervising physician I was immediately available for consultation/collaboration.   EKG Interpretation None       Babette Relic, MD 08/10/13 2049

## 2013-09-10 ENCOUNTER — Emergency Department (HOSPITAL_COMMUNITY): Payer: Medicaid Other

## 2013-09-10 ENCOUNTER — Encounter (HOSPITAL_COMMUNITY): Payer: Self-pay | Admitting: Emergency Medicine

## 2013-09-10 ENCOUNTER — Emergency Department (HOSPITAL_COMMUNITY)
Admission: EM | Admit: 2013-09-10 | Discharge: 2013-09-11 | Disposition: A | Discharge status: Home / Self Care | Payer: Medicaid Other | Attending: Emergency Medicine | Admitting: Emergency Medicine

## 2013-09-10 DIAGNOSIS — F17200 Nicotine dependence, unspecified, uncomplicated: Secondary | ICD-10-CM | POA: Insufficient documentation

## 2013-09-10 DIAGNOSIS — Z87828 Personal history of other (healed) physical injury and trauma: Secondary | ICD-10-CM | POA: Diagnosis not present

## 2013-09-10 DIAGNOSIS — K120 Recurrent oral aphthae: Secondary | ICD-10-CM | POA: Diagnosis present

## 2013-09-10 DIAGNOSIS — Z79899 Other long term (current) drug therapy: Secondary | ICD-10-CM | POA: Insufficient documentation

## 2013-09-10 DIAGNOSIS — Z8739 Personal history of other diseases of the musculoskeletal system and connective tissue: Secondary | ICD-10-CM | POA: Diagnosis not present

## 2013-09-10 DIAGNOSIS — Z880 Allergy status to penicillin: Secondary | ICD-10-CM | POA: Diagnosis not present

## 2013-09-10 DIAGNOSIS — I10 Essential (primary) hypertension: Secondary | ICD-10-CM | POA: Insufficient documentation

## 2013-09-10 DIAGNOSIS — R300 Dysuria: Secondary | ICD-10-CM | POA: Insufficient documentation

## 2013-09-10 DIAGNOSIS — K1370 Unspecified lesions of oral mucosa: Secondary | ICD-10-CM | POA: Insufficient documentation

## 2013-09-10 DIAGNOSIS — K121 Other forms of stomatitis: Secondary | ICD-10-CM

## 2013-09-10 DIAGNOSIS — R1084 Generalized abdominal pain: Secondary | ICD-10-CM | POA: Insufficient documentation

## 2013-09-10 DIAGNOSIS — R1030 Lower abdominal pain, unspecified: Secondary | ICD-10-CM

## 2013-09-10 LAB — CBC WITH DIFFERENTIAL/PLATELET
BASOS PCT: 0 % (ref 0–1)
Basophils Absolute: 0 10*3/uL (ref 0.0–0.1)
EOS PCT: 1 % (ref 0–5)
Eosinophils Absolute: 0.1 10*3/uL (ref 0.0–0.7)
HCT: 41.4 % (ref 36.0–46.0)
Hemoglobin: 14 g/dL (ref 12.0–15.0)
Lymphocytes Relative: 24 % (ref 12–46)
Lymphs Abs: 1.9 10*3/uL (ref 0.7–4.0)
MCH: 30.3 pg (ref 26.0–34.0)
MCHC: 33.8 g/dL (ref 30.0–36.0)
MCV: 89.6 fL (ref 78.0–100.0)
Monocytes Absolute: 0.3 10*3/uL (ref 0.1–1.0)
Monocytes Relative: 4 % (ref 3–12)
Neutro Abs: 5.5 10*3/uL (ref 1.7–7.7)
Neutrophils Relative %: 71 % (ref 43–77)
PLATELETS: 275 10*3/uL (ref 150–400)
RBC: 4.62 MIL/uL (ref 3.87–5.11)
RDW: 14.7 % (ref 11.5–15.5)
WBC: 7.7 10*3/uL (ref 4.0–10.5)

## 2013-09-10 LAB — URINALYSIS, ROUTINE W REFLEX MICROSCOPIC
Bilirubin Urine: NEGATIVE
Glucose, UA: NEGATIVE mg/dL
HGB URINE DIPSTICK: NEGATIVE
Ketones, ur: NEGATIVE mg/dL
NITRITE: NEGATIVE
PROTEIN: NEGATIVE mg/dL
SPECIFIC GRAVITY, URINE: 1.017 (ref 1.005–1.030)
UROBILINOGEN UA: 0.2 mg/dL (ref 0.0–1.0)
pH: 6 (ref 5.0–8.0)

## 2013-09-10 LAB — HIV ANTIBODY (ROUTINE TESTING W REFLEX): HIV 1&2 Ab, 4th Generation: NONREACTIVE

## 2013-09-10 LAB — WET PREP, GENITAL
TRICH WET PREP: NONE SEEN
Yeast Wet Prep HPF POC: NONE SEEN

## 2013-09-10 LAB — BASIC METABOLIC PANEL
Anion gap: 12 (ref 5–15)
BUN: 21 mg/dL (ref 6–23)
CHLORIDE: 103 meq/L (ref 96–112)
CO2: 24 mEq/L (ref 19–32)
Calcium: 9 mg/dL (ref 8.4–10.5)
Creatinine, Ser: 1.14 mg/dL — ABNORMAL HIGH (ref 0.50–1.10)
GFR calc non Af Amer: 52 mL/min — ABNORMAL LOW (ref >90)
GFR, EST AFRICAN AMERICAN: 60 mL/min — AB (ref >90)
GLUCOSE: 91 mg/dL (ref 70–99)
Potassium: 4.1 mEq/L (ref 3.7–5.3)
Sodium: 139 mEq/L (ref 137–147)

## 2013-09-10 LAB — URINE MICROSCOPIC-ADD ON

## 2013-09-10 LAB — POC OCCULT BLOOD, ED: Fecal Occult Bld: NEGATIVE

## 2013-09-10 LAB — RPR

## 2013-09-10 MED ORDER — OXYCODONE-ACETAMINOPHEN 5-325 MG PO TABS
1.0000 | ORAL_TABLET | Freq: Once | ORAL | Status: AC
  Administered 2013-09-10: 1 via ORAL
  Filled 2013-09-10: qty 1

## 2013-09-10 NOTE — ED Notes (Signed)
Pt states that she has been having hurting when she urinates for 3 weeks. Pt has taken medications for UTI but havent helped.  Pt also c/o abscess under her tongue.

## 2013-09-10 NOTE — ED Notes (Addendum)
Initial contact-A&Ox4. Ambulatory and moving all extremities. C/o dysuria x1 month with associated leg pain. States "the doctor gave me a black and yellow pill for this problem a week ago but it didn't work and I finished taking it 3-4 days ago." Denies blood in urine. Endorses chills but no fever or diarrhea. Has had N, V. Emesis x1 this morning. Hx kidney stones per patient. Also c/o pain in right side of mouth near the gum. Denies precipitating events. Awaiting MD/PA.
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
## 2013-09-10 NOTE — ED Notes (Signed)
Patient transported to CT 

## 2013-09-10 NOTE — ED Notes (Signed)
In between asking pt questions pt starts snoring.

## 2013-09-11 LAB — GC/CHLAMYDIA PROBE AMP
CT Probe RNA: NEGATIVE
GC Probe RNA: NEGATIVE

## 2013-09-11 MED ORDER — OXYCODONE-ACETAMINOPHEN 5-325 MG PO TABS
1.0000 | ORAL_TABLET | Freq: Once | ORAL | Status: AC
  Administered 2013-09-11: 1 via ORAL
  Filled 2013-09-11: qty 1

## 2013-09-11 MED ORDER — MAGIC MOUTHWASH: ORAL | Status: DC

## 2013-09-11 MED ORDER — OXYCODONE-ACETAMINOPHEN 5-325 MG PO TABS: 1.0000 | ORAL_TABLET | ORAL | Status: DC | PRN

## 2013-09-11 NOTE — Discharge Instructions (Signed)
Abdominal Pain Many things can cause abdominal pain. Usually, abdominal pain is not caused by a disease and will improve without treatment. It can often be observed and treated at home. Your health care provider will do a physical exam and possibly order blood tests and X-rays to help determine the seriousness of your pain. However, in many cases, more time must pass before a clear cause of the pain can be found. Before that point, your health care provider may not know if you need more testing or further treatment. HOME CARE INSTRUCTIONS  Monitor your abdominal pain for any changes. The following actions may help to alleviate any discomfort you are experiencing:  Only take over-the-counter or prescription medicines as directed by your health care provider.  Do not take laxatives unless directed to do so by your health care provider.  Try a clear liquid diet (broth, tea, or water) as directed by your health care provider. Slowly move to a bland diet as tolerated. SEEK MEDICAL CARE IF:  You have unexplained abdominal pain.  You have abdominal pain associated with nausea or diarrhea.  You have pain when you urinate or have a bowel movement.  You experience abdominal pain that wakes you in the night.  You have abdominal pain that is worsened or improved by eating food.  You have abdominal pain that is worsened with eating fatty foods.  You have a fever. SEEK IMMEDIATE MEDICAL CARE IF:   Your pain does not go away within 2 hours.  You keep throwing up (vomiting).  Your pain is felt only in portions of the abdomen, such as the right side or the left lower portion of the abdomen.  You pass bloody or black tarry stools. MAKE SURE YOU:  Understand these instructions.   Will watch your condition.   Will get help right away if you are not doing well or get worse.  Document Released: 10/07/2004 Document Revised: 01/02/2013 Document Reviewed: 09/06/2012 [facility] Patient Information  2015 [place], [place]. This information is not intended to replace advice given to you by your health care provider. Make sure you discuss any questions you have with your health care provider.  Oral Ulcers Oral ulcers are painful, shallow sores around the lining of the mouth. They can affect the gums, the inside of the lips, and the cheeks. (Sores on the outside of the lips and on the face are different.) They typically first occur in school-aged children and teenagers. Oral ulcers may also be called canker sores or cold sores. CAUSES  Canker sores and cold sores can be caused by many factors including:  Infection.  Injury.  Sun exposure.  Medications.  Emotional stress.  Food allergies.  Vitamin deficiencies.  Toothpastes containing sodium lauryl sulfate. The herpes virus can be the cause of mouth ulcers. The first infection can be severe and cause 10 or more ulcers on the gums, tongue, and lips with fever and difficulty in swallowing. This infection usually occurs between the ages of 80 and 3 years.  SYMPTOMS  The typical sore is about  inch (6 mm) in size and is an oval or round ulcer with red borders. DIAGNOSIS  Your caregiver can diagnose simple oral ulcers by examination. Additional testing is usually not required.  TREATMENT  Treatment is aimed at pain relief. Generally, oral ulcers resolve by themselves within 1 to 2 weeks without medication and are not contagious unless caused by herpes (and other viruses). Antibiotics are not effective with mouth sores. Avoid direct contact with  others until the ulcer is completely healed. See your caregiver for follow-up care as recommended. Also:  Offer a soft diet.  Encourage plenty of fluids to prevent dehydration. Popsicles and milk shakes can be helpful.  Avoid acidic and salty foods and drinks such as orange juice.  Infants and young children will often refuse to drink because of pain. Using a teaspoon, cup, or syringe to give  small amounts of fluids frequently can help prevent dehydration.  Cold compresses on the face may help reduce pain.  Pain medication can help control soreness.  A solution of diphenhydramine mixed with a liquid antacid can be useful to decrease the soreness of ulcers. Consult a caregiver for the dosing.  Liquids or ointments with a numbing ingredient may be helpful when used as recommended.  Older children and teenagers can rinse their mouth with a salt-water mixture (1/2 teaspoon of salt in 8 ounces of water) four times a day. This treatment is uncomfortable but may reduce the time the ulcers are present.  There are many over-the-counter throat lozenges and medications available for oral ulcers. Their effectiveness has not been studied.  Consult your medical caregiver prior to using homeopathic treatments for oral ulcers. SEEK MEDICAL CARE IF:   You think your child needs to be seen.  The pain worsens and you cannot control it.  There are 4 or more ulcers.  The lips and gums begin to bleed and crust.  A single mouth ulcer is near a tooth that is causing a toothache or pain.  Your child has a fever, swollen face, or swollen glands.  The ulcers began after starting a medication.  Mouth ulcers keep reoccurring or last more than 2 weeks.  You think your child is not taking adequate fluids. SEEK IMMEDIATE MEDICAL CARE IF:   Your child has a high fever.  Your child is unable to swallow or becomes dehydrated.  Your child looks or acts very ill.  An ulcer caused by a chemical your child accidentally put in their mouth. Document Released: 02/05/2004 Document Revised: 05/14/2013 Document Reviewed: 09/19/2008 [facility] Patient Information 2015 [place], [place]. This information is not intended to replace advice given to you by your health care provider. Make sure you discuss any questions you have with your health care provider.

## 2013-10-11 ENCOUNTER — Other Ambulatory Visit (HOSPITAL_COMMUNITY): Payer: Self-pay | Admitting: Emergency Medicine

## 2013-11-02 ENCOUNTER — Encounter (HOSPITAL_COMMUNITY): Payer: Self-pay | Admitting: Emergency Medicine

## 2013-11-02 ENCOUNTER — Emergency Department (HOSPITAL_COMMUNITY)
Admission: EM | Admit: 2013-11-02 | Discharge: 2013-11-02 | Disposition: A | Discharge status: Home / Self Care | Payer: Medicaid Other | Attending: Emergency Medicine | Admitting: Emergency Medicine

## 2013-11-02 DIAGNOSIS — Z880 Allergy status to penicillin: Secondary | ICD-10-CM | POA: Insufficient documentation

## 2013-11-02 DIAGNOSIS — Z7952 Long term (current) use of systemic steroids: Secondary | ICD-10-CM | POA: Diagnosis not present

## 2013-11-02 DIAGNOSIS — Z720 Tobacco use: Secondary | ICD-10-CM | POA: Insufficient documentation

## 2013-11-02 DIAGNOSIS — Z79899 Other long term (current) drug therapy: Secondary | ICD-10-CM | POA: Insufficient documentation

## 2013-11-02 DIAGNOSIS — Z87828 Personal history of other (healed) physical injury and trauma: Secondary | ICD-10-CM | POA: Insufficient documentation

## 2013-11-02 DIAGNOSIS — N390 Urinary tract infection, site not specified: Secondary | ICD-10-CM

## 2013-11-02 DIAGNOSIS — M069 Rheumatoid arthritis, unspecified: Secondary | ICD-10-CM | POA: Insufficient documentation

## 2013-11-02 DIAGNOSIS — I10 Essential (primary) hypertension: Secondary | ICD-10-CM | POA: Insufficient documentation

## 2013-11-02 DIAGNOSIS — R300 Dysuria: Secondary | ICD-10-CM | POA: Diagnosis present

## 2013-11-02 LAB — CBC
HCT: 39.7 % (ref 36.0–46.0)
Hemoglobin: 12.9 g/dL (ref 12.0–15.0)
MCH: 30 pg (ref 26.0–34.0)
MCHC: 32.5 g/dL (ref 30.0–36.0)
MCV: 92.3 fL (ref 78.0–100.0)
Platelets: 230 10*3/uL (ref 150–400)
RBC: 4.3 MIL/uL (ref 3.87–5.11)
RDW: 14.2 % (ref 11.5–15.5)
WBC: 6.1 10*3/uL (ref 4.0–10.5)

## 2013-11-02 LAB — URINE MICROSCOPIC-ADD ON

## 2013-11-02 LAB — BASIC METABOLIC PANEL
Anion gap: 10 (ref 5–15)
BUN: 11 mg/dL (ref 6–23)
CO2: 27 mEq/L (ref 19–32)
Calcium: 8.8 mg/dL (ref 8.4–10.5)
Chloride: 103 mEq/L (ref 96–112)
Creatinine, Ser: 1.15 mg/dL — ABNORMAL HIGH (ref 0.50–1.10)
GFR calc Af Amer: 59 mL/min — ABNORMAL LOW (ref >90)
GFR calc non Af Amer: 51 mL/min — ABNORMAL LOW (ref >90)
Glucose, Bld: 114 mg/dL — ABNORMAL HIGH (ref 70–99)
Potassium: 3.8 mEq/L (ref 3.7–5.3)
Sodium: 140 mEq/L (ref 137–147)

## 2013-11-02 LAB — URINALYSIS, ROUTINE W REFLEX MICROSCOPIC
Bilirubin Urine: NEGATIVE
Glucose, UA: NEGATIVE mg/dL
Hgb urine dipstick: NEGATIVE
Ketones, ur: NEGATIVE mg/dL
Nitrite: POSITIVE — AB
Protein, ur: NEGATIVE mg/dL
Specific Gravity, Urine: 1.018 (ref 1.005–1.030)
Urobilinogen, UA: 1 mg/dL (ref 0.0–1.0)
pH: 6 (ref 5.0–8.0)

## 2013-11-02 MED ORDER — ONDANSETRON 4 MG PO TBDP
4.0000 mg | ORAL_TABLET | Freq: Once | ORAL | Status: AC
  Administered 2013-11-02: 4 mg via ORAL
  Filled 2013-11-02: qty 1

## 2013-11-02 MED ORDER — SULFAMETHOXAZOLE-TRIMETHOPRIM 800-160 MG PO TABS: 1.0000 | ORAL_TABLET | Freq: Two times a day (BID) | ORAL | Status: DC

## 2013-11-02 MED ORDER — SULFAMETHOXAZOLE-TMP DS 800-160 MG PO TABS
1.0000 | ORAL_TABLET | Freq: Once | ORAL | Status: AC
  Administered 2013-11-02: 1 via ORAL
  Filled 2013-11-02: qty 1

## 2013-11-02 MED ORDER — HYDROMORPHONE HCL 1 MG/ML IJ SOLN
1.0000 mg | Freq: Once | INTRAMUSCULAR | Status: AC
  Administered 2013-11-02: 1 mg via INTRAMUSCULAR
  Filled 2013-11-02: qty 1

## 2013-11-02 MED ORDER — OXYCODONE-ACETAMINOPHEN 5-325 MG PO TABS: 1.0000 | ORAL_TABLET | ORAL | Status: DC | PRN

## 2013-11-02 NOTE — ED Notes (Signed)
Pt in waiting room - called 3 times for lab work - no answer.

## 2013-11-02 NOTE — ED Notes (Signed)
This RN attempted twice to draw blood without success, unable to attempt further. Other staff member will attempt.

## 2013-11-02 NOTE — Discharge Instructions (Signed)

## 2013-11-02 NOTE — ED Notes (Signed)
Per pt, states painful urination and burning with urination-last urination was this am-states it hurts too bad to urinate-states going on for 2 months-saw PCP and was placed on Oxybutynin-states it is not working

## 2013-11-06 LAB — URINE CULTURE: Colony Count: >=100000

## 2013-11-07 ENCOUNTER — Telehealth (HOSPITAL_COMMUNITY): Payer: Self-pay

## 2013-11-07 NOTE — ED Notes (Signed)
Post ED Visit - Positive Culture Follow-up: Successful Patient Follow-Up  Culture assessed and recommendations reviewed by: []  Wes Glenshaw, Pharm.D., BCPS []  Heide Guile, Pharm.D., BCPS []  Alycia Rossetti, Pharm.D., BCPS []  Gregory, [place].D., BCPS, AAHIVP [x]  Legrand Como, Pharm.D., BCPS, AAHIVP []  Hassie Bruce, Pharm.D. []  Milus Glazier, Pharm.D.  Positive urine culture  []  Patient discharged without antimicrobial prescription and treatment is now indicated [x]  Organism is resistant to prescribed ED discharge antimicrobial []  Patient with positive blood cultures  Changes discussed with ED provider: Jamison Neighbor New antibiotic prescription stop Septra and start Macrobid 100mg  po bid x 7 days.  Called to attempted contact with pt  Contacted patient, date 10/28, time 1131   Ileene Musa 11/07/2013, 11:32 AM

## 2013-11-07 NOTE — Progress Notes (Signed)
ED Antimicrobial Stewardship Positive Culture Follow Up   [name] is an 58 y.o. female who presented to [facility] on 11/02/2013 with a chief complaint of  Chief Complaint  Patient presents with  . Dysuria    Recent Results (from the past 720 hour(s))  URINE CULTURE     Status: None   Collection Time    11/02/13  3:01 PM      Result Value Ref Range Status   Specimen Description URINE, CLEAN CATCH   Final   Special Requests NONE   Final   Culture  Setup Time     Final   Value: 11/02/2013 22:42     Performed at [facility]     Final   Value: >=100,000 COLONIES/ML     Performed at [facility]   Culture     Final   Value: ESCHERICHIA COLI     Note: Two isolates with different morphologies were identified as the same organism.The most resistant organism was reported.     Performed at [facility]   Report Status 11/06/2013 FINAL   Final   Organism ID, Bacteria ESCHERICHIA COLI   Final    [x]  Treated with Septra, organism resistant to prescribed antimicrobial  New antibiotic prescription: Macrobid 100mg  PO BID x 7 days.  Patient should stop Septra.  ED Provider: Alvina Chou, PA-C   Norva Riffle 11/07/2013, 10:15 AM Infectious Diseases Pharmacist Phone# [phone]
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
## 2013-11-09 ENCOUNTER — Telehealth (HOSPITAL_COMMUNITY): Payer: Self-pay

## 2013-11-13 ENCOUNTER — Encounter (HOSPITAL_COMMUNITY): Payer: Self-pay | Admitting: *Deleted

## 2013-11-13 ENCOUNTER — Emergency Department (HOSPITAL_COMMUNITY): Payer: Medicaid Other

## 2013-11-13 ENCOUNTER — Emergency Department (HOSPITAL_COMMUNITY)
Admission: EM | Admit: 2013-11-13 | Discharge: 2013-11-13 | Disposition: A | Discharge status: Home / Self Care | Payer: Medicaid Other | Attending: Emergency Medicine | Admitting: Emergency Medicine

## 2013-11-13 DIAGNOSIS — I10 Essential (primary) hypertension: Secondary | ICD-10-CM | POA: Diagnosis not present

## 2013-11-13 DIAGNOSIS — N390 Urinary tract infection, site not specified: Secondary | ICD-10-CM | POA: Insufficient documentation

## 2013-11-13 DIAGNOSIS — Z87828 Personal history of other (healed) physical injury and trauma: Secondary | ICD-10-CM | POA: Insufficient documentation

## 2013-11-13 DIAGNOSIS — M549 Dorsalgia, unspecified: Secondary | ICD-10-CM | POA: Insufficient documentation

## 2013-11-13 DIAGNOSIS — M069 Rheumatoid arthritis, unspecified: Secondary | ICD-10-CM | POA: Diagnosis not present

## 2013-11-13 DIAGNOSIS — R110 Nausea: Secondary | ICD-10-CM | POA: Diagnosis not present

## 2013-11-13 DIAGNOSIS — K5900 Constipation, unspecified: Secondary | ICD-10-CM | POA: Insufficient documentation

## 2013-11-13 DIAGNOSIS — Z79899 Other long term (current) drug therapy: Secondary | ICD-10-CM | POA: Diagnosis not present

## 2013-11-13 DIAGNOSIS — Z880 Allergy status to penicillin: Secondary | ICD-10-CM | POA: Diagnosis not present

## 2013-11-13 DIAGNOSIS — Z720 Tobacco use: Secondary | ICD-10-CM | POA: Insufficient documentation

## 2013-11-13 LAB — URINALYSIS, ROUTINE W REFLEX MICROSCOPIC
Bilirubin Urine: NEGATIVE
GLUCOSE, UA: NEGATIVE mg/dL
Ketones, ur: NEGATIVE mg/dL
Nitrite: NEGATIVE
Protein, ur: NEGATIVE mg/dL
SPECIFIC GRAVITY, URINE: 1.02 (ref 1.005–1.030)
Urobilinogen, UA: 0.2 mg/dL (ref 0.0–1.0)
pH: 6 (ref 5.0–8.0)

## 2013-11-13 LAB — URINE MICROSCOPIC-ADD ON

## 2013-11-13 MED ORDER — LIDOCAINE HCL 1 % IJ SOLN
INTRAMUSCULAR | Status: AC
  Administered 2013-11-13: 20 mL
  Filled 2013-11-13: qty 20

## 2013-11-13 MED ORDER — FLEET ENEMA 7-19 GM/118ML RE ENEM
1.0000 | ENEMA | Freq: Once | RECTAL | Status: AC
  Administered 2013-11-13: 1 via RECTAL
  Filled 2013-11-13: qty 1

## 2013-11-13 MED ORDER — ACETAMINOPHEN 325 MG PO TABS
650.0000 mg | ORAL_TABLET | Freq: Once | ORAL | Status: AC
  Administered 2013-11-13: 650 mg via ORAL
  Filled 2013-11-13: qty 2

## 2013-11-13 MED ORDER — IBUPROFEN 200 MG PO TABS: 600.0000 mg | ORAL_TABLET | Freq: Once | ORAL | Status: DC

## 2013-11-13 MED ORDER — POLYETHYLENE GLYCOL 3350 17 GM/SCOOP PO POWD: ORAL | Status: DC

## 2013-11-13 MED ORDER — CEFTRIAXONE SODIUM 1 G IJ SOLR
1.0000 g | Freq: Once | INTRAMUSCULAR | Status: AC
  Administered 2013-11-13: 1 g via INTRAMUSCULAR
  Filled 2013-11-13: qty 10

## 2013-11-13 MED ORDER — PHENAZOPYRIDINE HCL 200 MG PO TABS
200.0000 mg | ORAL_TABLET | Freq: Three times a day (TID) | ORAL | Status: DC
  Administered 2013-11-13: 200 mg via ORAL
  Filled 2013-11-13: qty 1

## 2013-11-13 MED ORDER — CEPHALEXIN 500 MG PO CAPS: 500.0000 mg | ORAL_CAPSULE | Freq: Two times a day (BID) | ORAL | Status: DC
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
## 2013-11-13 NOTE — ED Notes (Signed)
MD at bedside. 

## 2013-11-13 NOTE — ED Notes (Signed)
Pt c/o painful urination and unable to urinate; states this is fourth time she has had this same complaint; previous history of kidney stones

## 2013-11-13 NOTE — Discharge Instructions (Signed)
Constipation °Constipation is when a person has fewer than three bowel movements a week, has difficulty having a bowel movement, or has stools that are dry, hard, or larger than normal. As people grow older, constipation is more common. If you try to fix constipation with medicines that make you have a bowel movement (laxatives), the problem may get worse. Long-term laxative use may cause the muscles of the colon to become weak. A low-fiber diet, not taking in enough fluids, and taking certain medicines may make constipation worse.  °CAUSES  °· Certain medicines, such as antidepressants, pain medicine, iron supplements, antacids, and water pills.   °· Certain diseases, such as diabetes, irritable bowel syndrome (IBS), thyroid disease, or depression.   °· Not drinking enough water.   °· Not eating enough fiber-rich foods.   °· Stress or travel.   °· Lack of physical activity or exercise.   °· Ignoring the urge to have a bowel movement.   °· Using laxatives too much.   °SIGNS AND SYMPTOMS  °· Having fewer than three bowel movements a week.   °· Straining to have a bowel movement.   °· Having stools that are hard, dry, or larger than normal.   °· Feeling full or bloated.   °· Pain in the lower abdomen.   °· Not feeling relief after having a bowel movement.   °DIAGNOSIS  °Your health care provider will take a medical history and perform a physical exam. Further testing may be done for severe constipation. Some tests may include: °· A barium enema X-ray to examine your rectum, colon, and, sometimes, your small intestine.   °· A sigmoidoscopy to examine your lower colon.   °· A colonoscopy to examine your entire colon. °TREATMENT  °Treatment will depend on the severity of your constipation and what is causing it. Some dietary treatments include drinking more fluids and eating more fiber-rich foods. Lifestyle treatments may include regular exercise. If these diet and lifestyle recommendations do not help, your health care  provider may recommend taking over-the-counter laxative medicines to help you have bowel movements. Prescription medicines may be prescribed if over-the-counter medicines do not work.  °HOME CARE INSTRUCTIONS  °· Eat foods that have a lot of fiber, such as fruits, vegetables, whole grains, and beans. °· Limit foods high in fat and processed sugars, such as french fries, hamburgers, cookies, candies, and soda.   °· A fiber supplement may be added to your diet if you cannot get enough fiber from foods.   °· Drink enough fluids to keep your urine clear or pale yellow.   °· Exercise regularly or as directed by your health care provider.   °· Go to the restroom when you have the urge to go. Do not hold it.   °· Only take over-the-counter or prescription medicines as directed by your health care provider. Do not take other medicines for constipation without talking to your health care provider first.   °SEEK IMMEDIATE MEDICAL CARE IF:  °· You have bright red blood in your stool.   °· Your constipation lasts for more than 4 days or gets worse.   °· You have abdominal or rectal pain.   °· You have thin, pencil-like stools.   °· You have unexplained weight loss. °MAKE SURE YOU:  °· Understand these instructions. °· Will watch your condition. °· Will get help right away if you are not doing well or get worse. °Document Released: 09/26/2003 Document Revised: 01/02/2013 Document Reviewed: 10/09/2012 °ExitCare® Patient Information ©2015 [employer]. This information is not intended to replace advice given to you by your health care provider. Make sure you discuss any questions   you have with your health care provider. °Urinary Tract Infection °Urinary tract infections (UTIs) can develop anywhere along your urinary tract. Your urinary tract is your body's drainage system for removing wastes and extra water. Your urinary tract includes two kidneys, two ureters, a bladder, and a urethra. Your kidneys are a pair of bean-shaped  organs. Each kidney is about the size of your fist. They are located below your ribs, one on each side of your spine. °CAUSES °Infections are caused by microbes, which are microscopic organisms, including fungi, viruses, and bacteria. These organisms are so small that they can only be seen through a microscope. Bacteria are the microbes that most commonly cause UTIs. °SYMPTOMS  °Symptoms of UTIs may vary by age and gender of the patient and by the location of the infection. Symptoms in young women typically include a frequent and intense urge to urinate and a painful, burning feeling in the bladder or urethra during urination. Older women and men are more likely to be tired, shaky, and weak and have muscle aches and abdominal pain. A fever may mean the infection is in your kidneys. Other symptoms of a kidney infection include pain in your back or sides below the ribs, nausea, and vomiting. °DIAGNOSIS °To diagnose a UTI, your caregiver will ask you about your symptoms. Your caregiver also will ask to provide a urine sample. The urine sample will be tested for bacteria and white blood cells. White blood cells are made by your body to help fight infection. °TREATMENT  °Typically, UTIs can be treated with medication. Because most UTIs are caused by a bacterial infection, they usually can be treated with the use of antibiotics. The choice of antibiotic and length of treatment depend on your symptoms and the type of bacteria causing your infection. °HOME CARE INSTRUCTIONS °· If you were prescribed antibiotics, take them exactly as your caregiver instructs you. Finish the medication even if you feel better after you have only taken some of the medication. °· Drink enough water and fluids to keep your urine clear or pale yellow. °· Avoid caffeine, tea, and carbonated beverages. They tend to irritate your bladder. °· Empty your bladder often. Avoid holding urine for long periods of time. °· Empty your bladder before and  after sexual intercourse. °· After a bowel movement, women should cleanse from front to back. Use each tissue only once. °SEEK MEDICAL CARE IF:  °· You have back pain. °· You develop a fever. °· Your symptoms do not begin to resolve within 3 days. °SEEK IMMEDIATE MEDICAL CARE IF:  °· You have severe back pain or lower abdominal pain. °· You develop chills. °· You have nausea or vomiting. °· You have continued burning or discomfort with urination. °MAKE SURE YOU:  °· Understand these instructions. °· Will watch your condition. °· Will get help right away if you are not doing well or get worse. °Document Released: 10/07/2004 Document Revised: 06/29/2011 Document Reviewed: 02/05/2011 °ExitCare® Patient Information ©2015 [employer]. This information is not intended to replace advice given to you by your health care provider. Make sure you discuss any questions you have with your health care provider. ° °

## 2013-11-14 LAB — URINE CULTURE
Colony Count: 25000
SPECIAL REQUESTS: NORMAL

## 2013-11-18 ENCOUNTER — Emergency Department (HOSPITAL_COMMUNITY)
Admission: EM | Admit: 2013-11-18 | Discharge: 2013-11-18 | Disposition: A | Discharge status: Home / Self Care | Payer: Medicaid Other | Attending: Emergency Medicine | Admitting: Emergency Medicine

## 2013-11-18 ENCOUNTER — Emergency Department (HOSPITAL_COMMUNITY): Payer: Medicaid Other

## 2013-11-18 ENCOUNTER — Encounter (HOSPITAL_COMMUNITY): Payer: Self-pay | Admitting: Emergency Medicine

## 2013-11-18 DIAGNOSIS — R1013 Epigastric pain: Secondary | ICD-10-CM | POA: Insufficient documentation

## 2013-11-18 DIAGNOSIS — Z79899 Other long term (current) drug therapy: Secondary | ICD-10-CM | POA: Diagnosis not present

## 2013-11-18 DIAGNOSIS — Z7952 Long term (current) use of systemic steroids: Secondary | ICD-10-CM | POA: Insufficient documentation

## 2013-11-18 DIAGNOSIS — R079 Chest pain, unspecified: Secondary | ICD-10-CM

## 2013-11-18 DIAGNOSIS — Z880 Allergy status to penicillin: Secondary | ICD-10-CM | POA: Insufficient documentation

## 2013-11-18 DIAGNOSIS — M069 Rheumatoid arthritis, unspecified: Secondary | ICD-10-CM | POA: Diagnosis not present

## 2013-11-18 DIAGNOSIS — K5900 Constipation, unspecified: Secondary | ICD-10-CM | POA: Diagnosis not present

## 2013-11-18 DIAGNOSIS — I10 Essential (primary) hypertension: Secondary | ICD-10-CM | POA: Diagnosis not present

## 2013-11-18 DIAGNOSIS — M25462 Effusion, left knee: Secondary | ICD-10-CM | POA: Insufficient documentation

## 2013-11-18 DIAGNOSIS — Z87828 Personal history of other (healed) physical injury and trauma: Secondary | ICD-10-CM | POA: Diagnosis not present

## 2013-11-18 DIAGNOSIS — R109 Unspecified abdominal pain: Secondary | ICD-10-CM | POA: Diagnosis present

## 2013-11-18 DIAGNOSIS — Z720 Tobacco use: Secondary | ICD-10-CM | POA: Diagnosis not present

## 2013-11-18 DIAGNOSIS — Z792 Long term (current) use of antibiotics: Secondary | ICD-10-CM | POA: Insufficient documentation

## 2013-11-18 DIAGNOSIS — R52 Pain, unspecified: Secondary | ICD-10-CM

## 2013-11-18 LAB — URINALYSIS, ROUTINE W REFLEX MICROSCOPIC
Bilirubin Urine: NEGATIVE
Glucose, UA: NEGATIVE mg/dL
HGB URINE DIPSTICK: NEGATIVE
Ketones, ur: NEGATIVE mg/dL
Nitrite: NEGATIVE
PROTEIN: NEGATIVE mg/dL
Specific Gravity, Urine: 1.018 (ref 1.005–1.030)
UROBILINOGEN UA: 1 mg/dL (ref 0.0–1.0)
pH: 6 (ref 5.0–8.0)

## 2013-11-18 LAB — URINE MICROSCOPIC-ADD ON

## 2013-11-18 LAB — BASIC METABOLIC PANEL
Anion gap: 13 (ref 5–15)
BUN: 11 mg/dL (ref 6–23)
CHLORIDE: 105 meq/L (ref 96–112)
CO2: 25 meq/L (ref 19–32)
Calcium: 9.1 mg/dL (ref 8.4–10.5)
Creatinine, Ser: 1.02 mg/dL (ref 0.50–1.10)
GFR calc Af Amer: 68 mL/min — ABNORMAL LOW (ref >90)
GFR calc non Af Amer: 59 mL/min — ABNORMAL LOW (ref >90)
Glucose, Bld: 90 mg/dL (ref 70–99)
POTASSIUM: 3.6 meq/L — AB (ref 3.7–5.3)
Sodium: 143 mEq/L (ref 137–147)

## 2013-11-18 LAB — I-STAT TROPONIN, ED: TROPONIN I, POC: 0 ng/mL (ref 0.00–0.08)

## 2013-11-18 LAB — CBC
HCT: 39.2 % (ref 36.0–46.0)
HEMOGLOBIN: 13.1 g/dL (ref 12.0–15.0)
MCH: 29.6 pg (ref 26.0–34.0)
MCHC: 33.4 g/dL (ref 30.0–36.0)
MCV: 88.7 fL (ref 78.0–100.0)
Platelets: 348 10*3/uL (ref 150–400)
RBC: 4.42 MIL/uL (ref 3.87–5.11)
RDW: 13.2 % (ref 11.5–15.5)
WBC: 6.8 10*3/uL (ref 4.0–10.5)

## 2013-11-18 LAB — LIPASE, BLOOD: Lipase: 13 U/L (ref 11–59)

## 2013-11-18 LAB — POC OCCULT BLOOD, ED: Fecal Occult Bld: NEGATIVE

## 2013-11-18 MED ORDER — SODIUM CHLORIDE 0.9 % IV SOLN
1000.0000 mL | INTRAVENOUS | Status: DC
  Administered 2013-11-18: 1000 mL via INTRAVENOUS

## 2013-11-18 MED ORDER — DOCUSATE SODIUM 100 MG PO CAPS: 100.0000 mg | ORAL_CAPSULE | Freq: Two times a day (BID) | ORAL | Status: DC

## 2013-11-18 MED ORDER — HYDROMORPHONE HCL 1 MG/ML IJ SOLN
1.0000 mg | Freq: Once | INTRAMUSCULAR | Status: AC
  Administered 2013-11-18: 1 mg via INTRAVENOUS
  Filled 2013-11-18: qty 1

## 2013-11-18 MED ORDER — DICYCLOMINE HCL 20 MG PO TABS: 20.0000 mg | ORAL_TABLET | Freq: Two times a day (BID) | ORAL | Status: DC

## 2013-11-18 MED ORDER — PROMETHAZINE HCL 25 MG RE SUPP: 25.0000 mg | Freq: Four times a day (QID) | RECTAL | Status: DC | PRN

## 2013-11-18 MED ORDER — PROMETHAZINE HCL 25 MG/ML IJ SOLN
12.5000 mg | Freq: Once | INTRAMUSCULAR | Status: AC
  Administered 2013-11-18: 12.5 mg via INTRAVENOUS
  Filled 2013-11-18: qty 1

## 2013-11-18 MED ORDER — SODIUM CHLORIDE 0.9 % IV SOLN
1000.0000 mL | Freq: Once | INTRAVENOUS | Status: AC
  Administered 2013-11-18: 1000 mL via INTRAVENOUS

## 2013-11-18 NOTE — ED Notes (Signed)
Pt c/o intermittent epigastric pain that started last night with vomiting.  Pt states that she has been unable have BM in about a month.  Pt also c/o athirst in her knees bc she hasnt had any Prednisone.

## 2013-11-18 NOTE — Discharge Instructions (Signed)

## 2013-11-18 NOTE — ED Notes (Signed)
Patient transported to X-ray 
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
## 2014-01-11 IMAGING — CR DG KNEE COMPLETE 4+V*L*
4 series · 4 of 4 positions shown · non-contrast
Comparison: None.

CLINICAL DATA: Joint pain.  History of rheumatoid arthritis.  No
trauma.

LEFT KNEE - COMPLETE 4+ VIEW

[x knee ap left (1 of 3)]
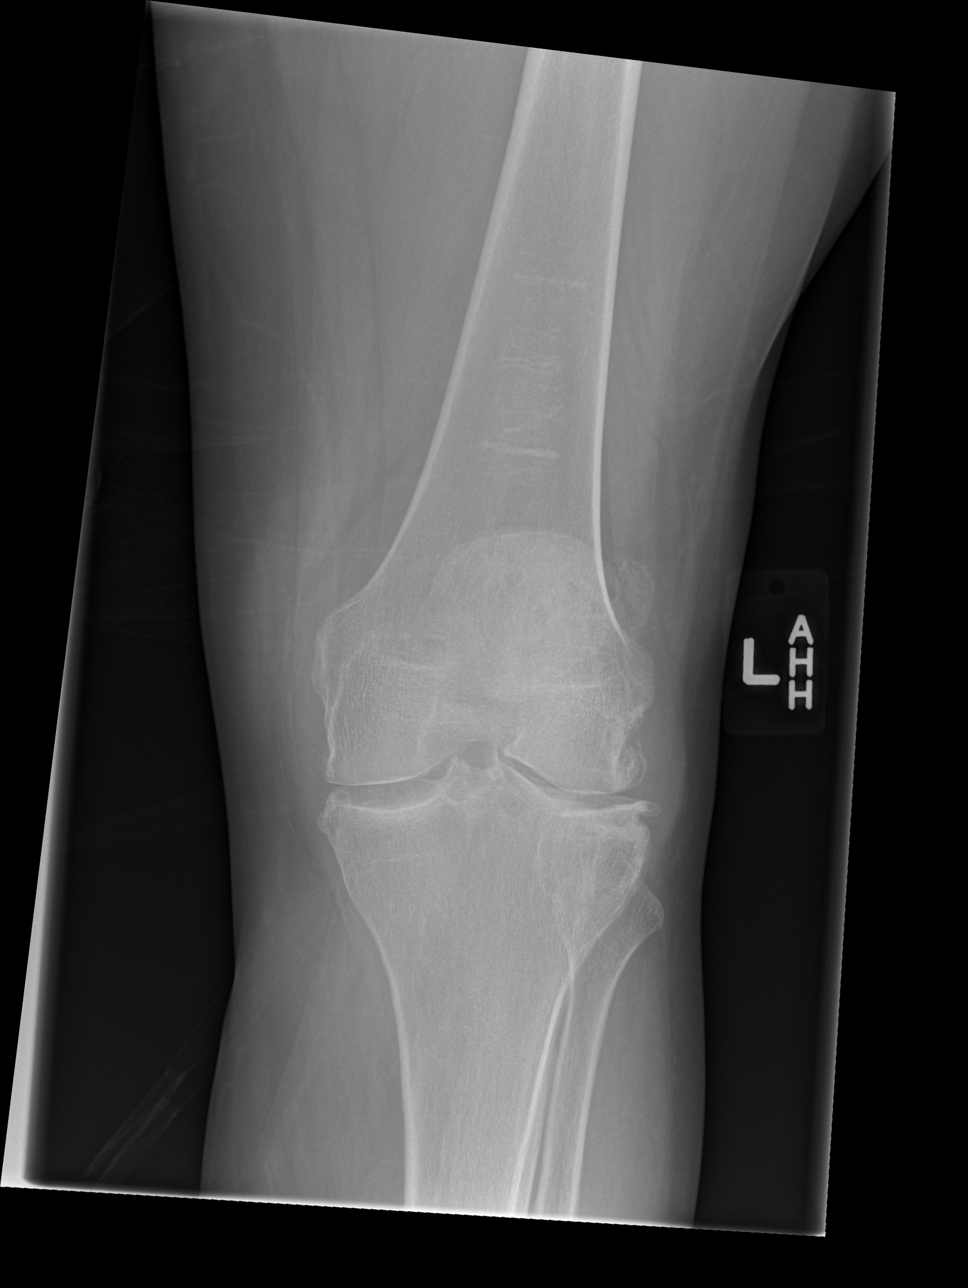

[x knee ap left (2 of 3)]
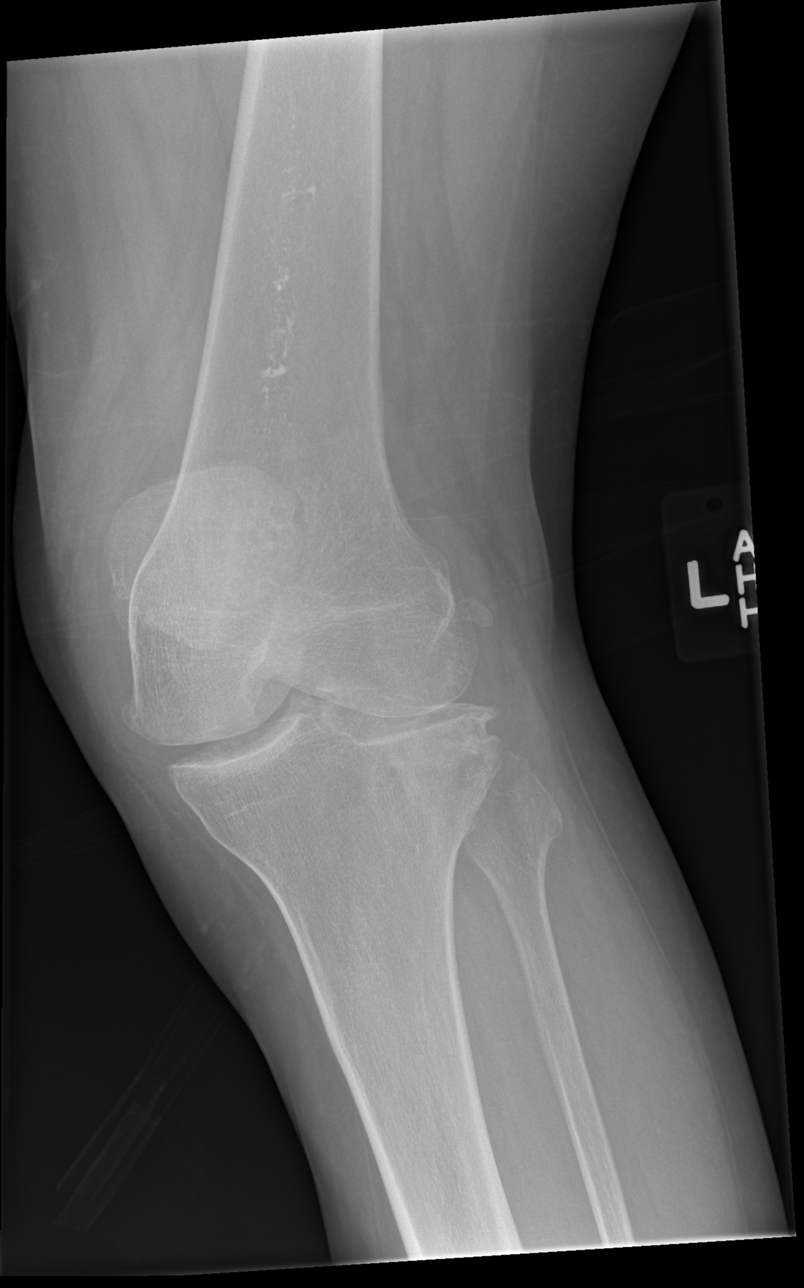

[x knee ap left (3 of 3)]
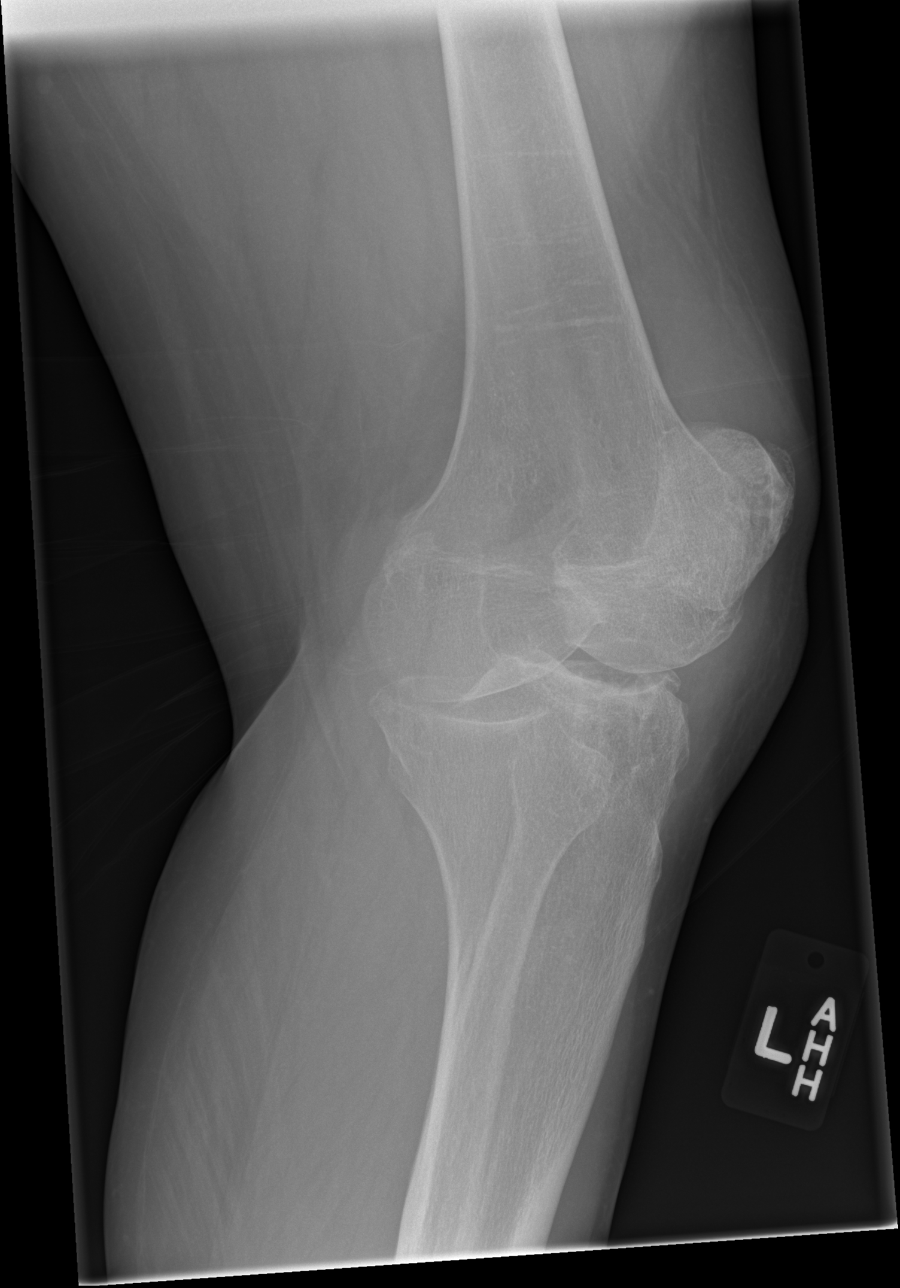

[x knee lat left]
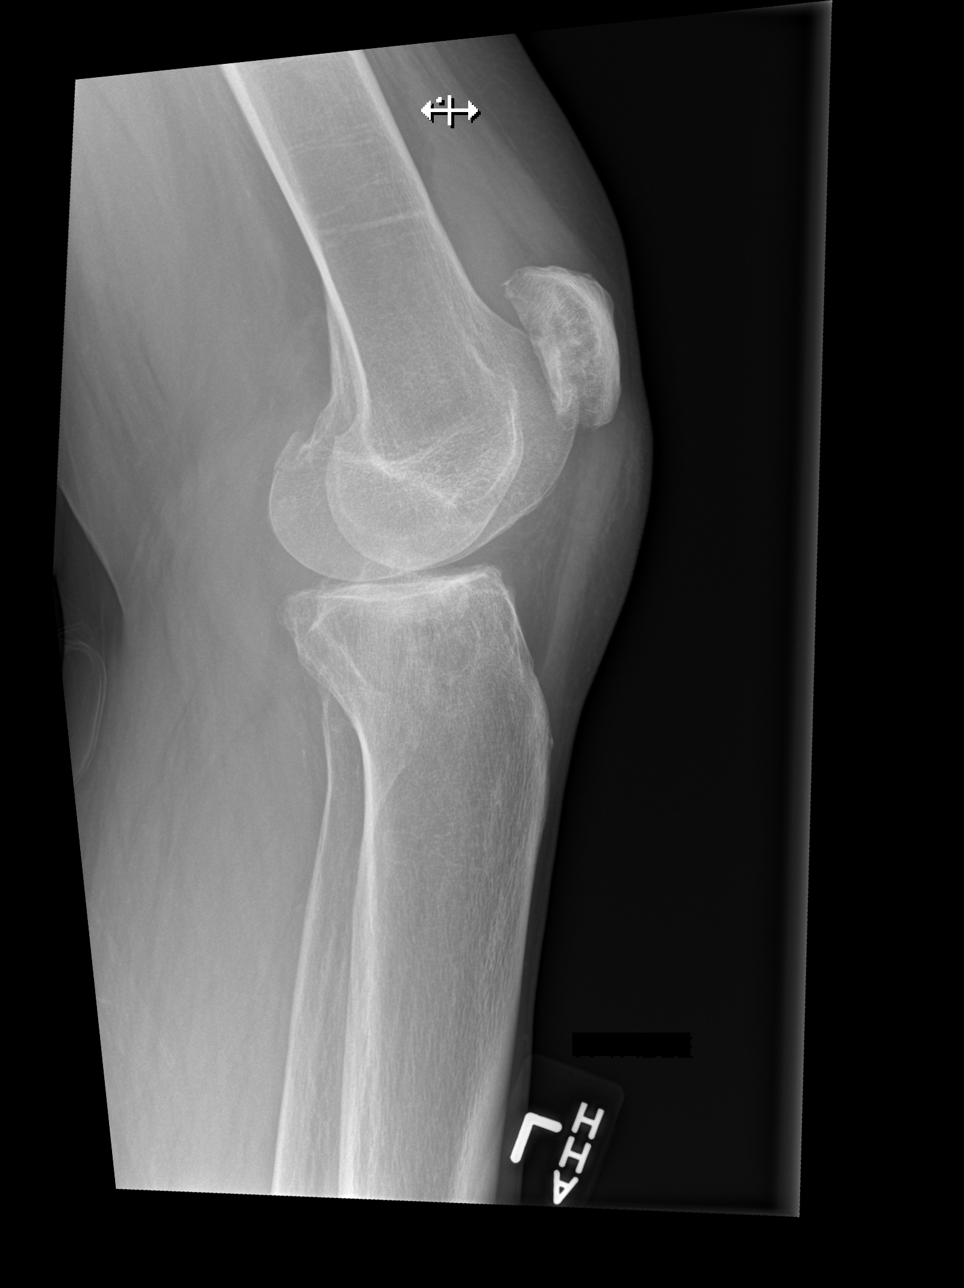

[4 of 4 positions shown; findings below may reference images not displayed]

FINDINGS: No fracture.  There are degenerative changes with lateral
and patellofemoral joint space compartment narrowing, marginal
osteophytes from all three compartments, and irregularity and mild
sclerosis along the dorsal margin of the patella.  A small joint
effusion is present.  The bones are diffusely demineralized.  The
surrounding soft tissues are unremarkable.
IMPRESSION: No fracture or acute finding.

Degenerative changes which may reflect osteoarthritis.  The pattern
raises the possibility of CPPD arthropathy.  A small joint effusion
is present.

## 2014-01-22 ENCOUNTER — Encounter (HOSPITAL_COMMUNITY): Payer: Self-pay | Admitting: Emergency Medicine

## 2014-01-22 ENCOUNTER — Emergency Department (HOSPITAL_COMMUNITY)
Admission: EM | Admit: 2014-01-22 | Discharge: 2014-01-22 | Disposition: A | Discharge status: Home / Self Care | Payer: Medicaid Other | Attending: Emergency Medicine | Admitting: Emergency Medicine

## 2014-01-22 DIAGNOSIS — Z720 Tobacco use: Secondary | ICD-10-CM | POA: Insufficient documentation

## 2014-01-22 DIAGNOSIS — R110 Nausea: Secondary | ICD-10-CM | POA: Insufficient documentation

## 2014-01-22 DIAGNOSIS — Z880 Allergy status to penicillin: Secondary | ICD-10-CM | POA: Insufficient documentation

## 2014-01-22 DIAGNOSIS — Z792 Long term (current) use of antibiotics: Secondary | ICD-10-CM | POA: Insufficient documentation

## 2014-01-22 DIAGNOSIS — Z7952 Long term (current) use of systemic steroids: Secondary | ICD-10-CM | POA: Insufficient documentation

## 2014-01-22 DIAGNOSIS — Z87828 Personal history of other (healed) physical injury and trauma: Secondary | ICD-10-CM | POA: Insufficient documentation

## 2014-01-22 DIAGNOSIS — N3001 Acute cystitis with hematuria: Secondary | ICD-10-CM | POA: Insufficient documentation

## 2014-01-22 DIAGNOSIS — I10 Essential (primary) hypertension: Secondary | ICD-10-CM | POA: Insufficient documentation

## 2014-01-22 DIAGNOSIS — Z79899 Other long term (current) drug therapy: Secondary | ICD-10-CM | POA: Insufficient documentation

## 2014-01-22 DIAGNOSIS — M069 Rheumatoid arthritis, unspecified: Secondary | ICD-10-CM | POA: Insufficient documentation

## 2014-01-22 LAB — URINALYSIS, ROUTINE W REFLEX MICROSCOPIC
BILIRUBIN URINE: NEGATIVE
Glucose, UA: NEGATIVE mg/dL
Ketones, ur: NEGATIVE mg/dL
NITRITE: POSITIVE — AB
PH: 6 (ref 5.0–8.0)
Protein, ur: 30 mg/dL — AB
Specific Gravity, Urine: 1.021 (ref 1.005–1.030)
Urobilinogen, UA: 1 mg/dL (ref 0.0–1.0)

## 2014-01-22 LAB — CBC WITH DIFFERENTIAL/PLATELET
BASOS PCT: 0 % (ref 0–1)
Basophils Absolute: 0 10*3/uL (ref 0.0–0.1)
EOS ABS: 0.3 10*3/uL (ref 0.0–0.7)
Eosinophils Relative: 5 % (ref 0–5)
HCT: 41 % (ref 36.0–46.0)
Hemoglobin: 13.3 g/dL (ref 12.0–15.0)
Lymphocytes Relative: 27 % (ref 12–46)
Lymphs Abs: 1.6 10*3/uL (ref 0.7–4.0)
MCH: 29.7 pg (ref 26.0–34.0)
MCHC: 32.4 g/dL (ref 30.0–36.0)
MCV: 91.5 fL (ref 78.0–100.0)
MONO ABS: 0.3 10*3/uL (ref 0.1–1.0)
MONOS PCT: 6 % (ref 3–12)
Neutro Abs: 3.8 10*3/uL (ref 1.7–7.7)
Neutrophils Relative %: 62 % (ref 43–77)
Platelets: 307 10*3/uL (ref 150–400)
RBC: 4.48 MIL/uL (ref 3.87–5.11)
RDW: 14 % (ref 11.5–15.5)
WBC: 6 10*3/uL (ref 4.0–10.5)

## 2014-01-22 LAB — COMPREHENSIVE METABOLIC PANEL
ALT: 13 U/L (ref 0–35)
AST: 21 U/L (ref 0–37)
Albumin: 3.6 g/dL (ref 3.5–5.2)
Alkaline Phosphatase: 79 U/L (ref 39–117)
Anion gap: 4 — ABNORMAL LOW (ref 5–15)
BILIRUBIN TOTAL: 0.6 mg/dL (ref 0.3–1.2)
BUN: 20 mg/dL (ref 6–23)
CALCIUM: 8.6 mg/dL (ref 8.4–10.5)
CO2: 26 mmol/L (ref 19–32)
Chloride: 108 mEq/L (ref 96–112)
Creatinine, Ser: 1.03 mg/dL (ref 0.50–1.10)
GFR calc Af Amer: 67 mL/min — ABNORMAL LOW (ref >90)
GFR, EST NON AFRICAN AMERICAN: 58 mL/min — AB (ref >90)
GLUCOSE: 122 mg/dL — AB (ref 70–99)
Potassium: 3.9 mmol/L (ref 3.5–5.1)
Sodium: 138 mmol/L (ref 135–145)
TOTAL PROTEIN: 7.6 g/dL (ref 6.0–8.3)

## 2014-01-22 LAB — URINE MICROSCOPIC-ADD ON

## 2014-01-22 LAB — WET PREP, GENITAL
Trich, Wet Prep: NONE SEEN
WBC WET PREP: NONE SEEN
Yeast Wet Prep HPF POC: NONE SEEN

## 2014-01-22 LAB — LIPASE, BLOOD: Lipase: 22 U/L (ref 11–59)

## 2014-01-22 MED ORDER — CEPHALEXIN 500 MG PO CAPS: 500.0000 mg | ORAL_CAPSULE | Freq: Three times a day (TID) | ORAL | Status: DC

## 2014-01-22 MED ORDER — MORPHINE SULFATE 4 MG/ML IJ SOLN
4.0000 mg | Freq: Once | INTRAMUSCULAR | Status: AC
  Administered 2014-01-22: 4 mg via INTRAVENOUS
  Filled 2014-01-22: qty 1

## 2014-01-22 MED ORDER — PHENAZOPYRIDINE HCL 100 MG PO TABS: 100.0000 mg | ORAL_TABLET | Freq: Three times a day (TID) | ORAL | Status: DC | PRN

## 2014-01-22 NOTE — Discharge Instructions (Signed)

## 2014-01-22 NOTE — ED Notes (Addendum)
Pt states that when she urinates it hurts and she sees pink, pt states now she is having lower abd pain. Pt states this has been going on for 3 months.

## 2014-01-22 NOTE — ED Notes (Addendum)
Pt was advised to follow up with Urology. Case Management provided resources to patient to  Help with transportation. Pt alert, oriented, and ambulatory upon DC. Her daughter is driving her home.

## 2014-01-22 NOTE — ED Notes (Signed)
Case Management at bedside.
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
## 2014-01-23 LAB — GC/CHLAMYDIA PROBE AMP
CT PROBE, AMP APTIMA: NEGATIVE
GC Probe RNA: NEGATIVE

## 2014-01-24 LAB — URINE CULTURE: Colony Count: >=100000

## 2014-01-26 ENCOUNTER — Telehealth (HOSPITAL_BASED_OUTPATIENT_CLINIC_OR_DEPARTMENT_OTHER): Payer: Self-pay | Admitting: Emergency Medicine

## 2014-01-26 NOTE — Telephone Encounter (Signed)
Post ED Visit - Positive Culture Follow-up  Culture report reviewed by antimicrobial stewardship pharmacist: []  Wes [place], Pharm.D., BCPS []  Heide Guile, Pharm.D., BCPS []  Alycia Rossetti, Pharm.D., BCPS [x]  [place], [place].D., BCPS, AAHIVP []  Legrand Como, Pharm.D., BCPS, AAHIVP []  Isac Sarna, Pharm.D., BCPS  Positive Urine culture Treated with Cephalexin, organism sensitive to the same and no further patient follow-up is required at this time.  Ernesta Amble 01/26/2014, 9:27 AM

## 2014-02-28 ENCOUNTER — Emergency Department (HOSPITAL_COMMUNITY): Payer: Medicaid Other

## 2014-02-28 ENCOUNTER — Emergency Department (HOSPITAL_COMMUNITY)
Admission: EM | Admit: 2014-02-28 | Discharge: 2014-02-28 | Disposition: A | Discharge status: Home / Self Care | Payer: Medicaid Other | Attending: Emergency Medicine | Admitting: Emergency Medicine

## 2014-02-28 ENCOUNTER — Encounter (HOSPITAL_COMMUNITY): Payer: Self-pay

## 2014-02-28 DIAGNOSIS — F1410 Cocaine abuse, uncomplicated: Secondary | ICD-10-CM | POA: Diagnosis not present

## 2014-02-28 DIAGNOSIS — Z87828 Personal history of other (healed) physical injury and trauma: Secondary | ICD-10-CM | POA: Diagnosis not present

## 2014-02-28 DIAGNOSIS — Z7952 Long term (current) use of systemic steroids: Secondary | ICD-10-CM | POA: Diagnosis not present

## 2014-02-28 DIAGNOSIS — Z880 Allergy status to penicillin: Secondary | ICD-10-CM | POA: Diagnosis not present

## 2014-02-28 DIAGNOSIS — M069 Rheumatoid arthritis, unspecified: Secondary | ICD-10-CM | POA: Diagnosis not present

## 2014-02-28 DIAGNOSIS — Z720 Tobacco use: Secondary | ICD-10-CM | POA: Insufficient documentation

## 2014-02-28 DIAGNOSIS — Z79899 Other long term (current) drug therapy: Secondary | ICD-10-CM | POA: Diagnosis not present

## 2014-02-28 DIAGNOSIS — R112 Nausea with vomiting, unspecified: Secondary | ICD-10-CM

## 2014-02-28 DIAGNOSIS — I10 Essential (primary) hypertension: Secondary | ICD-10-CM | POA: Insufficient documentation

## 2014-02-28 DIAGNOSIS — R079 Chest pain, unspecified: Secondary | ICD-10-CM | POA: Diagnosis not present

## 2014-02-28 DIAGNOSIS — N390 Urinary tract infection, site not specified: Secondary | ICD-10-CM | POA: Insufficient documentation

## 2014-02-28 DIAGNOSIS — Z792 Long term (current) use of antibiotics: Secondary | ICD-10-CM | POA: Diagnosis not present

## 2014-02-28 DIAGNOSIS — R0602 Shortness of breath: Secondary | ICD-10-CM | POA: Diagnosis not present

## 2014-02-28 DIAGNOSIS — M25561 Pain in right knee: Secondary | ICD-10-CM | POA: Diagnosis present

## 2014-02-28 LAB — URINALYSIS, ROUTINE W REFLEX MICROSCOPIC
Bilirubin Urine: NEGATIVE
GLUCOSE, UA: 100 mg/dL — AB
KETONES UR: NEGATIVE mg/dL
Nitrite: POSITIVE — AB
PH: 5.5 (ref 5.0–8.0)
Protein, ur: NEGATIVE mg/dL
Specific Gravity, Urine: 1.02 (ref 1.005–1.030)
Urobilinogen, UA: 1 mg/dL (ref 0.0–1.0)

## 2014-02-28 LAB — RAPID URINE DRUG SCREEN, HOSP PERFORMED
AMPHETAMINES: NOT DETECTED
BARBITURATES: NOT DETECTED
Benzodiazepines: NOT DETECTED
COCAINE: POSITIVE — AB
Opiates: NOT DETECTED
TETRAHYDROCANNABINOL: NOT DETECTED

## 2014-02-28 LAB — LIPASE, BLOOD: Lipase: 25 U/L (ref 11–59)

## 2014-02-28 LAB — CBC
HCT: 39.4 % (ref 36.0–46.0)
Hemoglobin: 13 g/dL (ref 12.0–15.0)
MCH: 29.2 pg (ref 26.0–34.0)
MCHC: 33 g/dL (ref 30.0–36.0)
MCV: 88.5 fL (ref 78.0–100.0)
PLATELETS: 491 10*3/uL — AB (ref 150–400)
RBC: 4.45 MIL/uL (ref 3.87–5.11)
RDW: 13.6 % (ref 11.5–15.5)
WBC: 5 10*3/uL (ref 4.0–10.5)

## 2014-02-28 LAB — COMPREHENSIVE METABOLIC PANEL
ALT: 8 U/L (ref 0–35)
AST: 13 U/L (ref 0–37)
Albumin: 3.3 g/dL — ABNORMAL LOW (ref 3.5–5.2)
Alkaline Phosphatase: 67 U/L (ref 39–117)
Anion gap: 9 (ref 5–15)
BILIRUBIN TOTAL: 0.5 mg/dL (ref 0.3–1.2)
BUN: 22 mg/dL (ref 6–23)
CALCIUM: 8.8 mg/dL (ref 8.4–10.5)
CHLORIDE: 108 mmol/L (ref 96–112)
CO2: 22 mmol/L (ref 19–32)
CREATININE: 1.21 mg/dL — AB (ref 0.50–1.10)
GFR calc non Af Amer: 48 mL/min — ABNORMAL LOW (ref >90)
GFR, EST AFRICAN AMERICAN: 55 mL/min — AB (ref >90)
GLUCOSE: 108 mg/dL — AB (ref 70–99)
Potassium: 3.9 mmol/L (ref 3.5–5.1)
Sodium: 139 mmol/L (ref 135–145)
Total Protein: 7.3 g/dL (ref 6.0–8.3)

## 2014-02-28 LAB — BRAIN NATRIURETIC PEPTIDE: B Natriuretic Peptide: 22.6 pg/mL (ref 0.0–100.0)

## 2014-02-28 LAB — URINE MICROSCOPIC-ADD ON

## 2014-02-28 LAB — I-STAT TROPONIN, ED
TROPONIN I, POC: 0.01 ng/mL (ref 0.00–0.08)
Troponin i, poc: 0.01 ng/mL (ref 0.00–0.08)

## 2014-02-28 LAB — SEDIMENTATION RATE: Sed Rate: 80 mm/hr — ABNORMAL HIGH (ref 0–22)

## 2014-02-28 MED ORDER — ASPIRIN 81 MG PO CHEW
324.0000 mg | CHEWABLE_TABLET | Freq: Once | ORAL | Status: AC
  Administered 2014-02-28: 324 mg via ORAL
  Filled 2014-02-28: qty 4

## 2014-02-28 MED ORDER — ONDANSETRON HCL 4 MG PO TABS: 4.0000 mg | ORAL_TABLET | Freq: Four times a day (QID) | ORAL | Status: DC

## 2014-02-28 MED ORDER — PREDNISONE 20 MG PO TABS: 40.0000 mg | ORAL_TABLET | Freq: Every day | ORAL | Status: DC

## 2014-02-28 MED ORDER — ONDANSETRON HCL 4 MG/2ML IJ SOLN
4.0000 mg | Freq: Once | INTRAMUSCULAR | Status: AC
  Administered 2014-02-28: 4 mg via INTRAVENOUS
  Filled 2014-02-28: qty 2

## 2014-02-28 MED ORDER — OXYCODONE-ACETAMINOPHEN 5-325 MG PO TABS
2.0000 | ORAL_TABLET | Freq: Once | ORAL | Status: AC
  Administered 2014-02-28: 2 via ORAL
  Filled 2014-02-28: qty 2

## 2014-02-28 MED ORDER — CEPHALEXIN 500 MG PO CAPS: 500.0000 mg | ORAL_CAPSULE | Freq: Two times a day (BID) | ORAL | Status: DC

## 2014-02-28 MED ORDER — SODIUM CHLORIDE 0.9 % IV BOLUS (SEPSIS)
1000.0000 mL | Freq: Once | INTRAVENOUS | Status: AC
  Administered 2014-02-28: 1000 mL via INTRAVENOUS

## 2014-02-28 MED ORDER — OXYCODONE-ACETAMINOPHEN 5-325 MG PO TABS: ORAL_TABLET | ORAL | Status: DC

## 2014-02-28 MED ORDER — METHYLPREDNISOLONE SODIUM SUCC 125 MG IJ SOLR
125.0000 mg | Freq: Once | INTRAMUSCULAR | Status: AC
  Administered 2014-02-28: 125 mg via INTRAVENOUS
  Filled 2014-02-28: qty 2

## 2014-02-28 MED ORDER — DEXTROSE 5 % IV SOLN
1.0000 g | Freq: Once | INTRAVENOUS | Status: AC
  Administered 2014-02-28: 1 g via INTRAVENOUS
  Filled 2014-02-28: qty 10

## 2014-02-28 MED ORDER — MORPHINE SULFATE 4 MG/ML IJ SOLN
4.0000 mg | Freq: Once | INTRAMUSCULAR | Status: AC
  Administered 2014-02-28: 4 mg via INTRAVENOUS
  Filled 2014-02-28: qty 1

## 2014-02-28 NOTE — Discharge Instructions (Signed)
Take percocet for breakthrough pain, do not drink alcohol, drive, care for children or do other critical tasks while taking percocet. ° °Please follow with your primary care doctor in the next 2 days for a check-up. They must obtain records for further management.  ° °Do not hesitate to return to the Emergency Department for any new, worsening or concerning symptoms.  ° °
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
## 2014-02-28 NOTE — ED Notes (Addendum)
Pt presents with c/o rheumatoid arthritis. Pt reports she feels some tingling in her left arm since last month. Pt reports she is experiencing joint pain and has been unable to see her doctor because of Medicaid issues. Pt c/o hematuria as well.

## 2014-03-01 LAB — C-REACTIVE PROTEIN: CRP: 6.3 mg/dL — ABNORMAL HIGH (ref <0.60)

## 2014-03-02 LAB — URINE CULTURE: Colony Count: >=100000

## 2014-03-02 NOTE — ED Provider Notes (Signed)
Patient brought back unsigned prescription for Percocet 5-3 25 written by Ms.Piscotta. Pharmacy refused to fill prescription as it was unsigned. I hand wrote a prescription for Percocet 5-3 25 dispense 15 no refills .I did not have an interaction with this patient.  Orlie Dakin, MD 03/02/14 1728

## 2014-03-04 ENCOUNTER — Telehealth (HOSPITAL_COMMUNITY): Payer: Self-pay

## 2014-03-04 NOTE — Telephone Encounter (Signed)
Post ED Visit - Positive Culture Follow-up  Culture report reviewed by antimicrobial stewardship pharmacist: []  [place]es [place], Pharm.D., BCPS []  Heide Guile, Pharm.D., BCPS []  Alycia Rossetti, Pharm.D., BCPS []  [place], [place].D., BCPS, AAHIVP [x]  Legrand Como, Pharm.D., BCPS, AAHIVP []  Isac Sarna, Pharm.D., BCPS  Positive Urine culture, >/= 100,000 colonies -> E Coli Treated with Cephalexin, organism sensitive to the same and no further patient follow-up is required at this time.  Dortha Kern 03/04/2014, 5:56 AM
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
## 2014-04-20 IMAGING — CR DG KNEE 1-2V*L*
1 series · 2 of 2 positions shown · non-contrast
Comparison: Prior knee radiographs 07/01/2012

CLINICAL DATA: Pain, swelling

LEFT KNEE - 1-2 VIEW

[Series 1: AP · left · 2 of 2 slices shown]
[im 1/2]
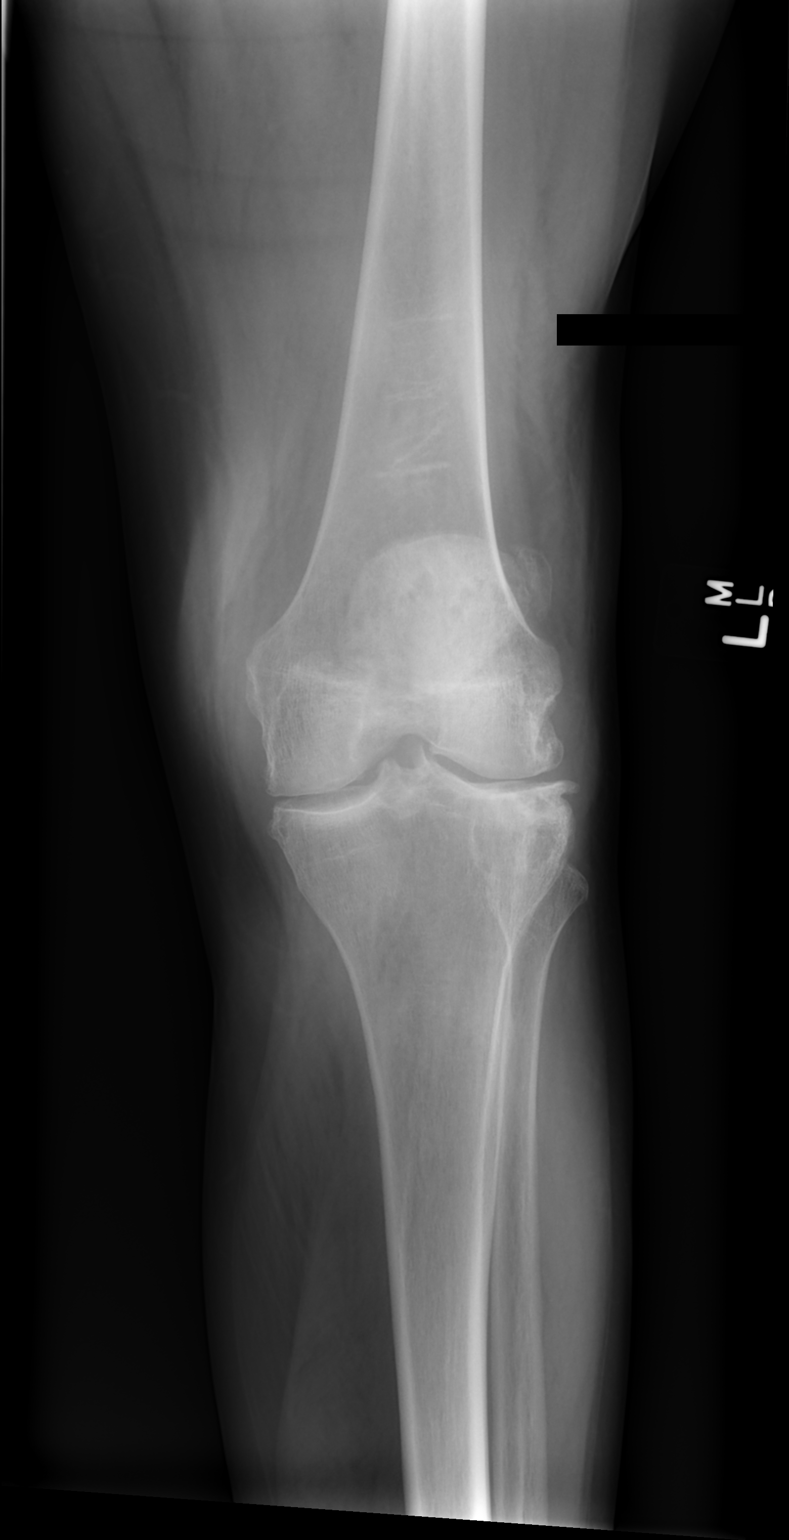
[im 2/2]
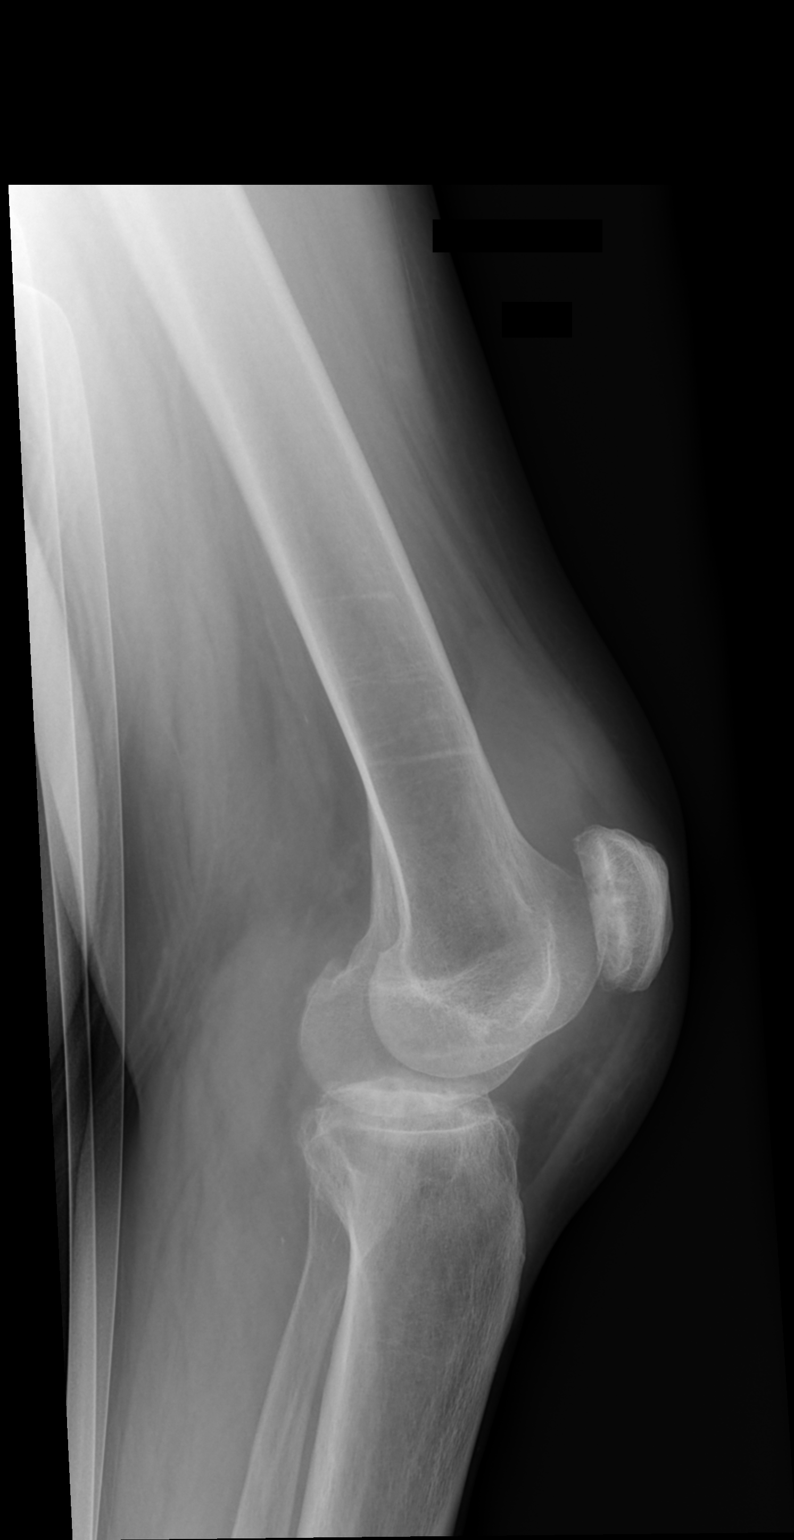

[2 of 2 positions shown; findings below may reference images not displayed]

FINDINGS: Stable appearance of moderately advanced tricompartmental
osteoarthritis most severe in the lateral compartment.  No acute
fracture or malalignment.  Persistent and slightly enlarged
suprapatellar joint effusion.  No acute fracture or malalignment.
No lytic or blastic osseous lesion.
IMPRESSION: 1.  Persistent and slightly enlarged suprapatellar joint effusion
is likely degenerative in etiology.
2.  Stable moderately advanced tricompartmental osteoarthritis most
significant in the lateral compartment.

## 2014-04-21 IMAGING — CR DG ELBOW 2V*R*
1 series · 2 of 2 positions shown · non-contrast
Comparison: None

CLINICAL DATA: Pain, patient states a foreign body may be present

EXAM:
RIGHT ELBOW - 2 VIEW

[Series 1: AP · right · 2 of 2 slices shown]
[im 1/2]
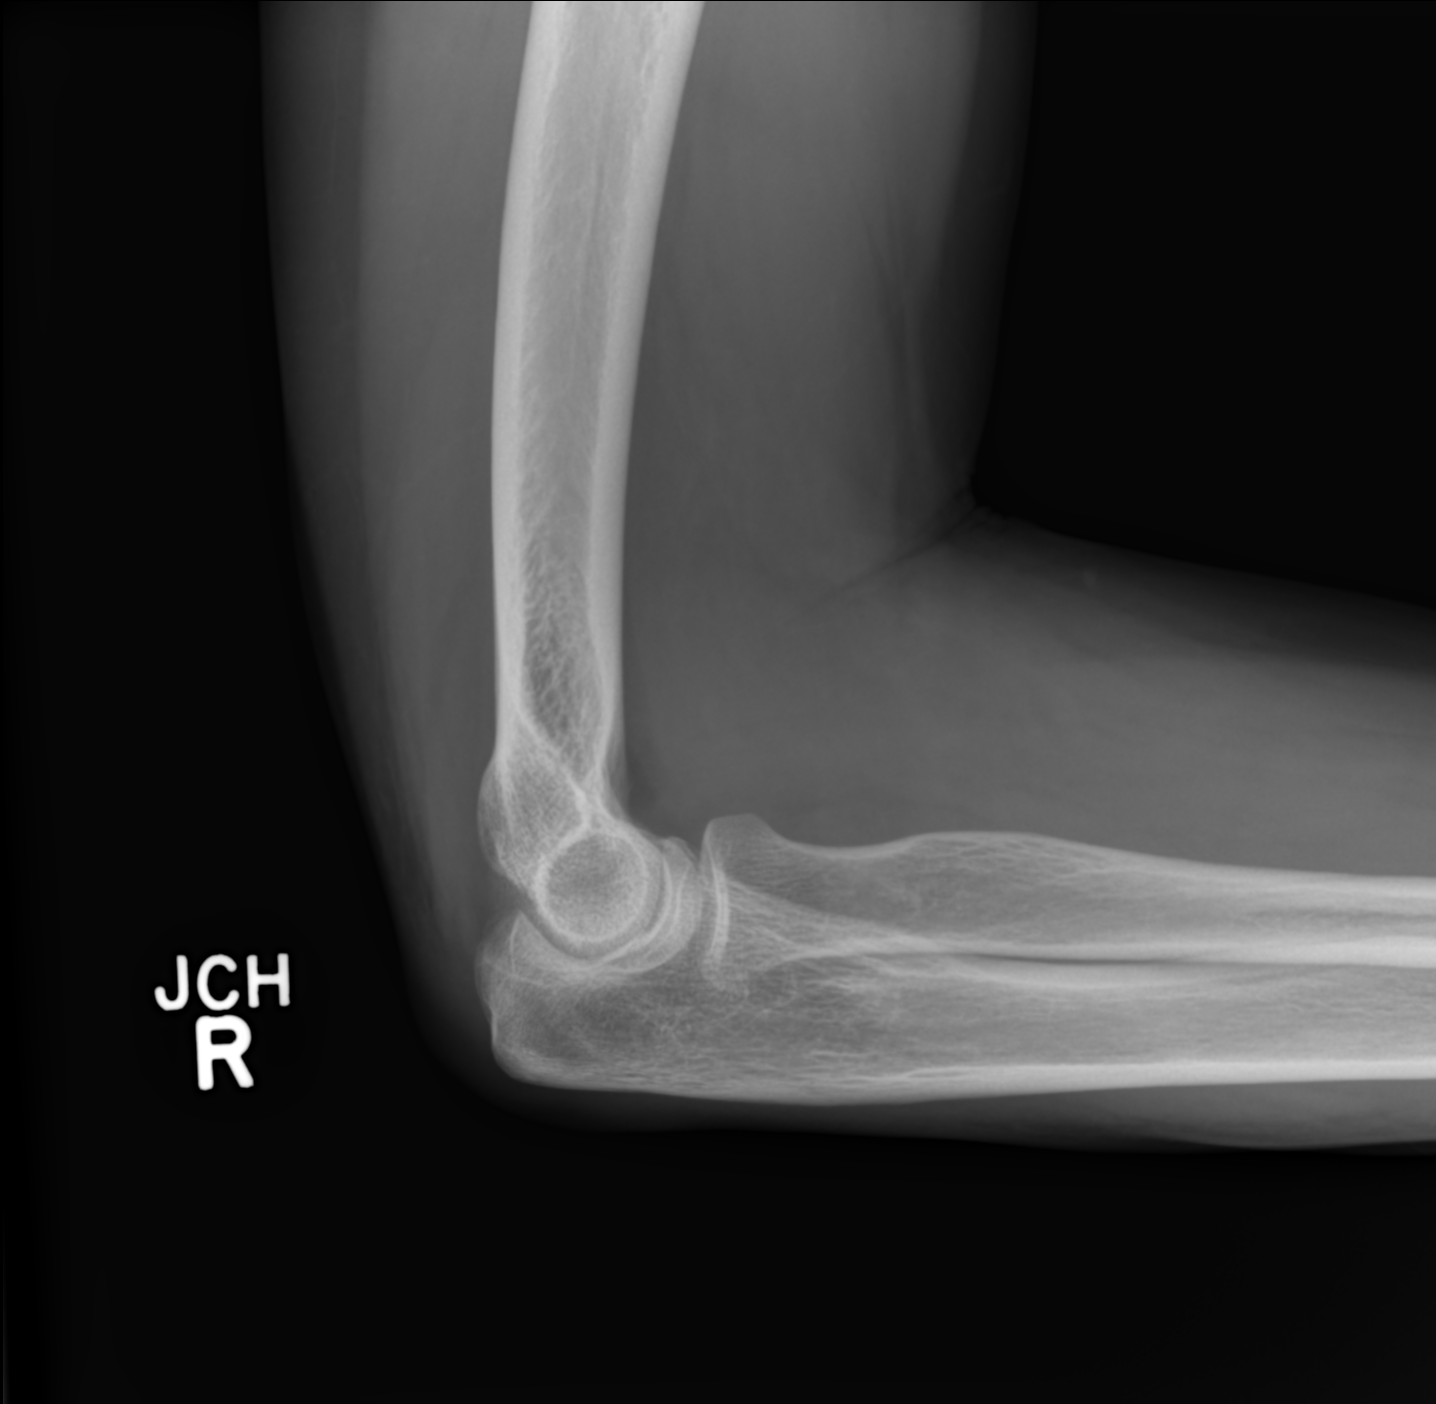
[im 2/2]
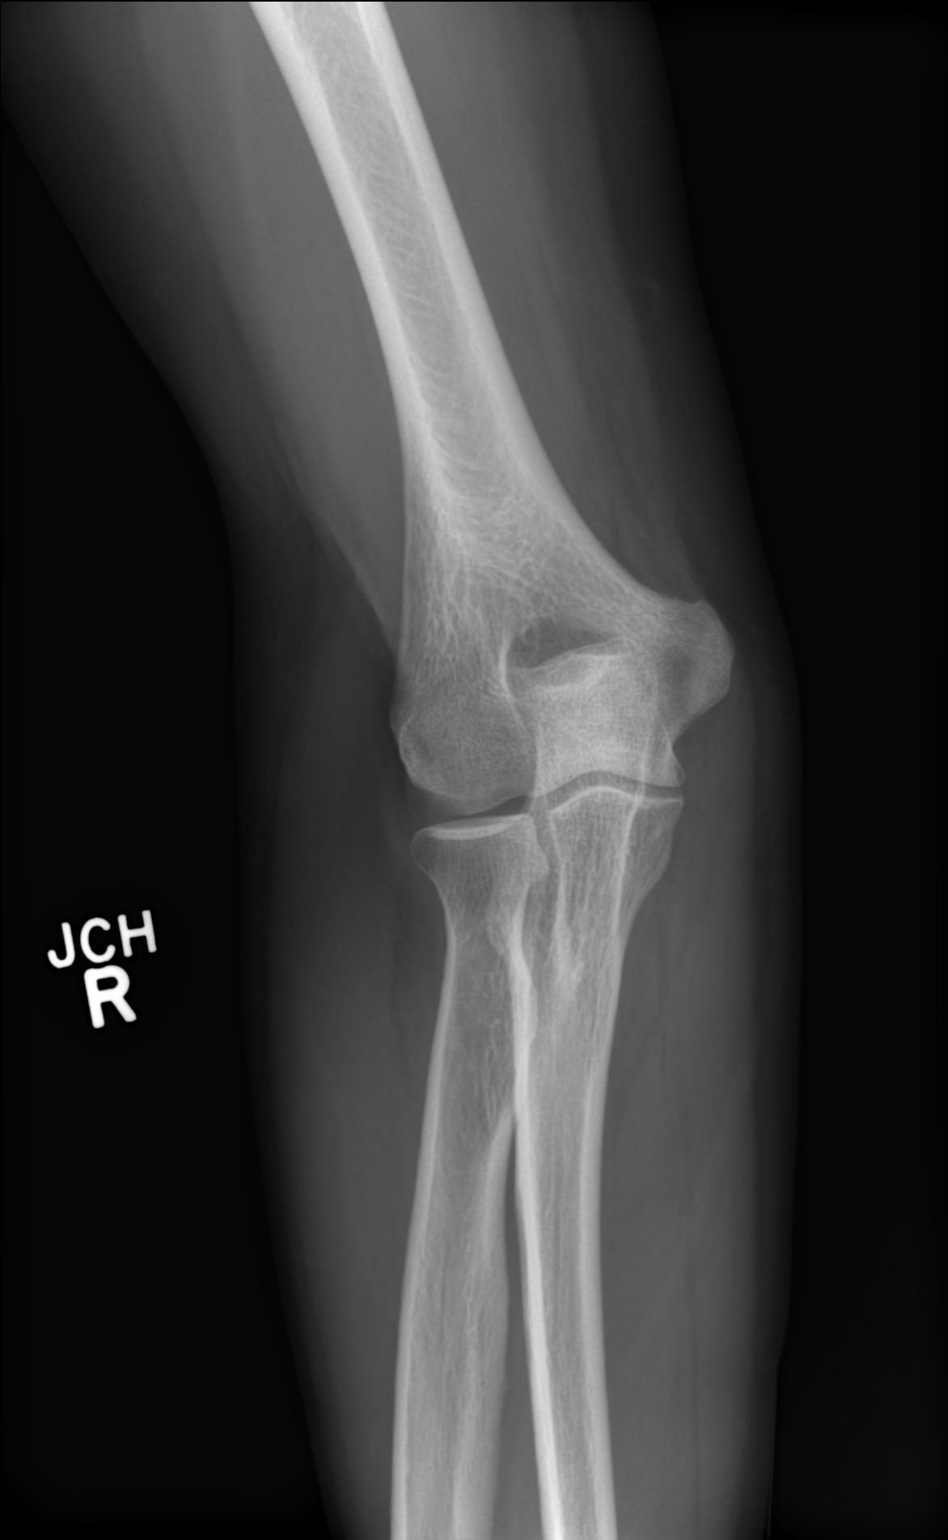

[2 of 2 positions shown; findings below may reference images not displayed]

FINDINGS: Osseous mineralization normal for technique.

Joint spaces preserved.

No acute fracture, dislocation or bone destruction.

No elbow joint effusion.
IMPRESSION: No acute abnormalities.

## 2014-04-21 IMAGING — MR MR ABDOMEN WO/W CM MRCP
13 of 22 series · 22 of 48 positions shown · IV contrast (multihance)
Comparison: 10/07/2012

CLINICAL DATA: Evaluate pancreas mass

EXAM:
MRI ABDOMEN WITHOUT AND WITH CONTRAST (INCLUDING MRCP)
TECHNIQUE: Multiplanar multisequence MR imaging of the abdomen was performed
both before and after the administration of intravenous contrast.
Heavily T2-weighted images of the biliary and pancreatic ducts were
obtained, and three-dimensional MRCP images were rendered by post
processing.
CONTRAST:  15mL MULTIHANCE GADOBENATE DIMEGLUMINE 529 MG/ML IV SOLN

[Series 4: T2 fat-sat · axial · 5.0mm · 0.78mm/px · z∈[+11,+236]mm · 2 of 46 slices shown]
[im 1/46]
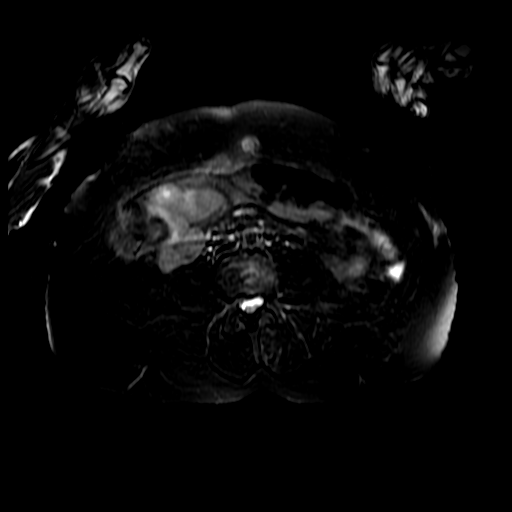
[im 46/46]
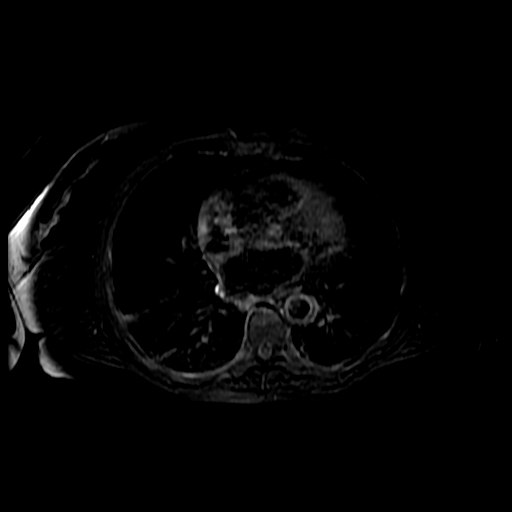

[Series 5: DWI b500 · axial · 6.0mm · 1.48mm/px · z∈[+6,+232]mm · 3 of 60 slices shown]
[im 1/60]
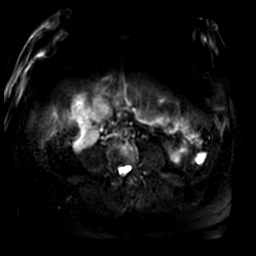
[im 30/60]
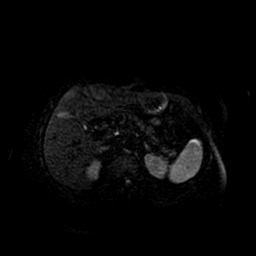
[im 60/60]
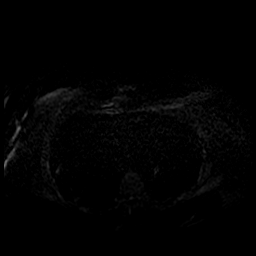

[Series 7: MRCP · coronal · 2.0mm · 0.70mm/px · 2 of 39 slices shown (1 of 2)]
[im 1/39]
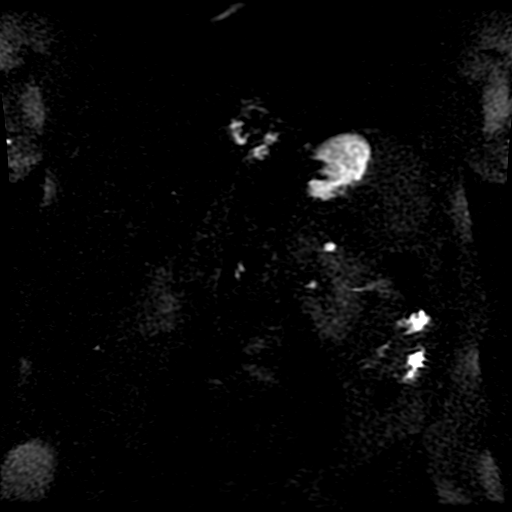
[im 39/39]
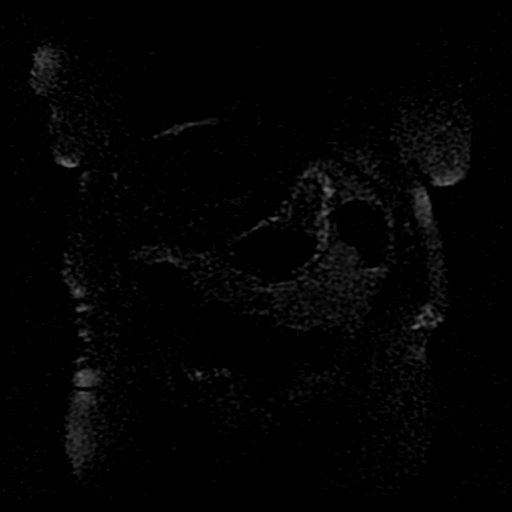

[Series 10: ax dualecho · axial · 5.0mm · 0.78mm/px · z∈[-5,+230]mm · 3 of 96 slices shown]
[im 1/96]
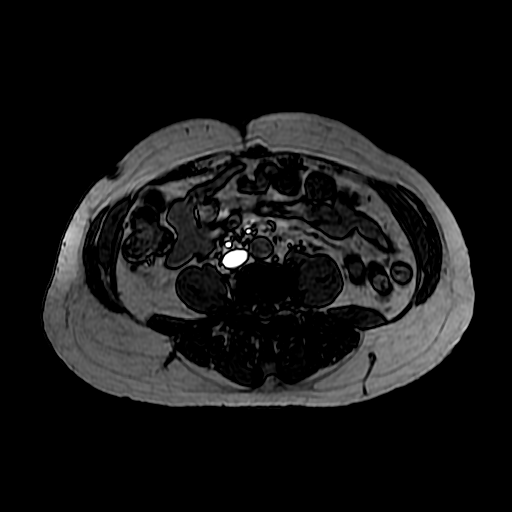
[im 48/96]
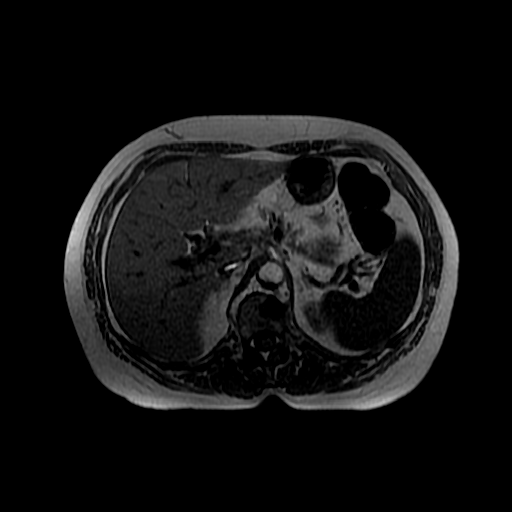
[im 96/96]
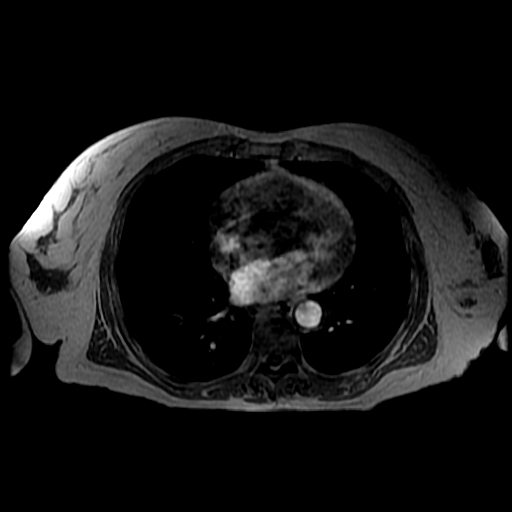

[Series 11: MRCP · coronal · 40.0mm · 0.70mm/px · 1 of 8 slices shown (2 of 2)]
[im 1/8]
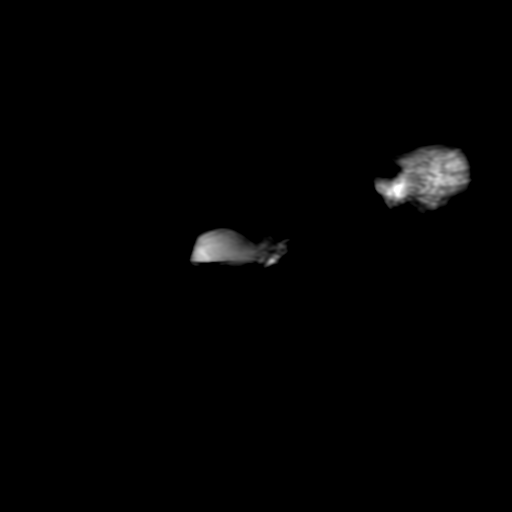

[Series 12: T1 fat-sat · axial · 5.0mm · 0.78mm/px · 1 of 48 slices shown]
[im 1/48]
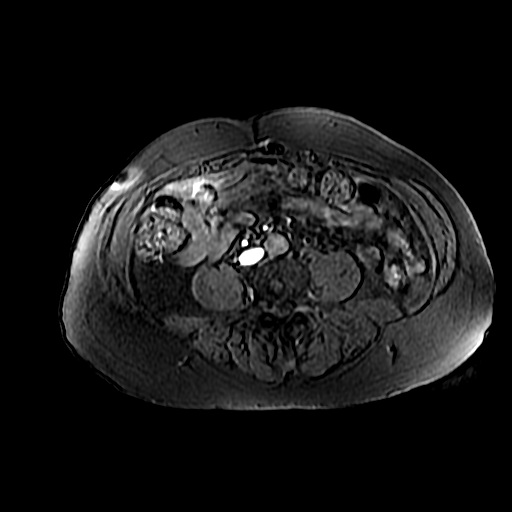

[Series 13: bSSFP fat-sat · coronal · 5.0mm · 0.78mm/px · 1 of 40 slices shown]
[im 1/40]
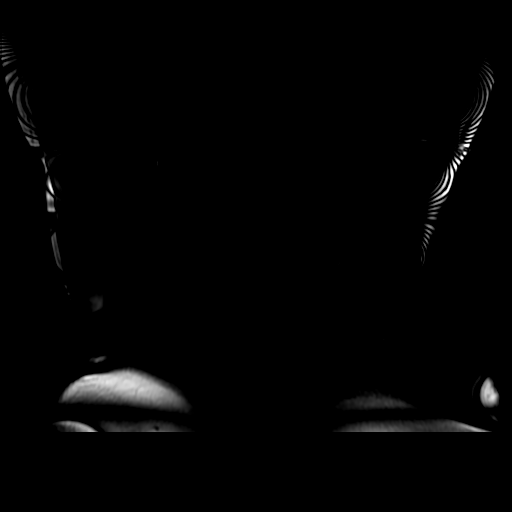

[Series 14: T2 · axial · 5.0mm · 0.78mm/px · 1 of 48 slices shown]
[im 1/48]
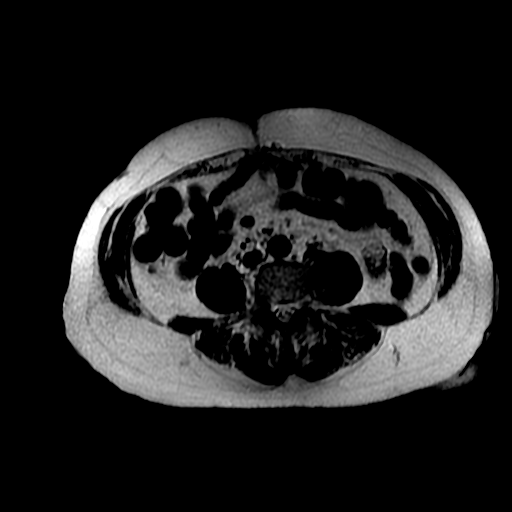

[Series 16: T1 dynamic · coronal · delayed · 5.0mm · 0.78mm/px · 2 of 88 slices shown (1 of 2)]
[im 1/88]
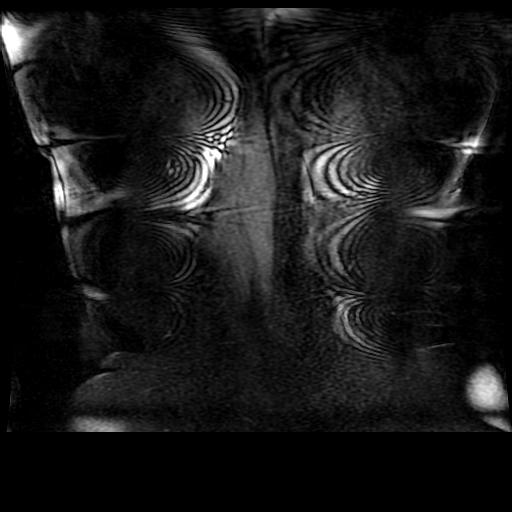
[im 88/88]
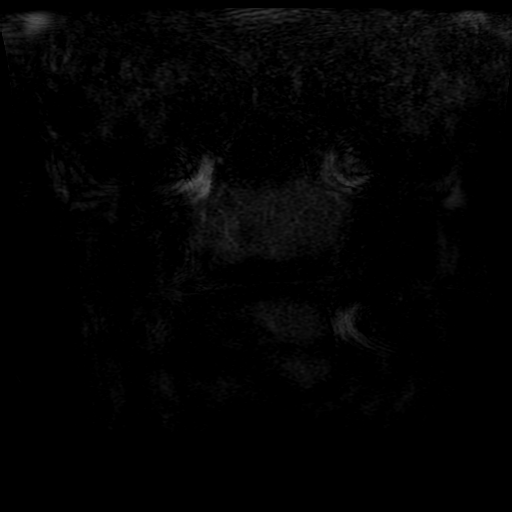

[Series 17: T1 dynamic · axial · 5.0mm · 0.78mm/px · z∈[+9,+227]mm · 2 of 88 slices shown (2 of 2)]
[im 1/88]
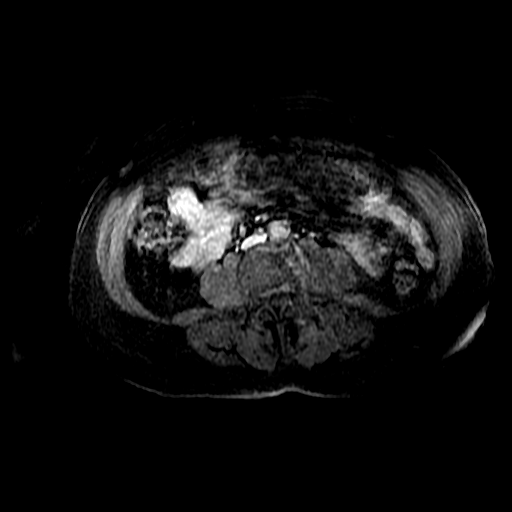
[im 88/88]
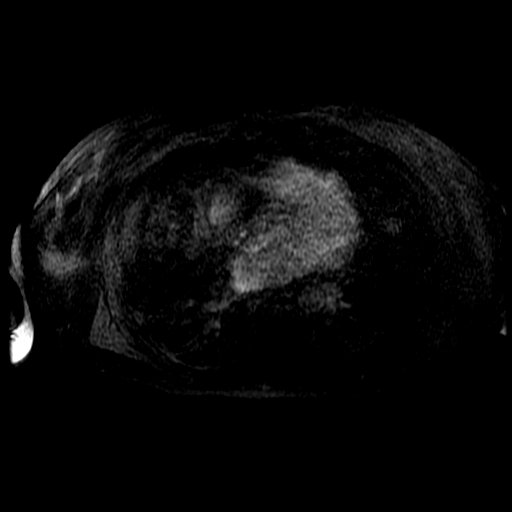

[Series 100: ((id)/(date)..88)-((id)/(id)/1..88) · axial · 5.0mm · 0.78mm/px · z∈[+9,+227]mm · 2 of 88 slices shown]
[im 1/88]
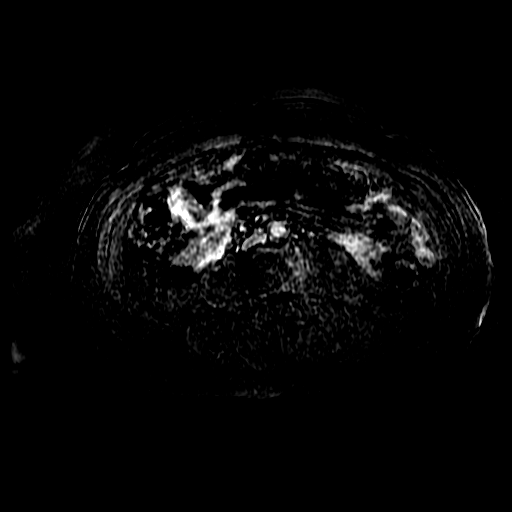
[im 88/88]
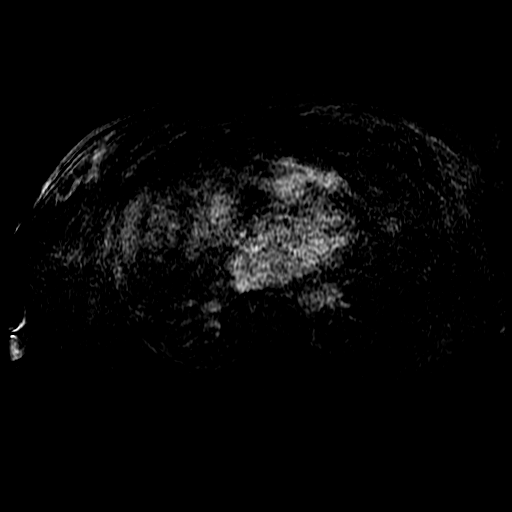

[Series 500: DWI · axial · 6.0mm · 1.48mm/px · 1 of 30 slices shown]
[im 1/30]
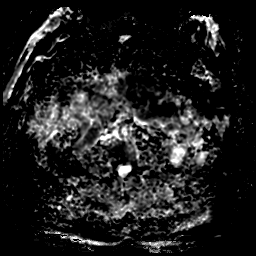

[Series 600: reformatted · axial · 1.6mm · 0.62mm/px · 1 of 137 slices shown]
[im 1/137]
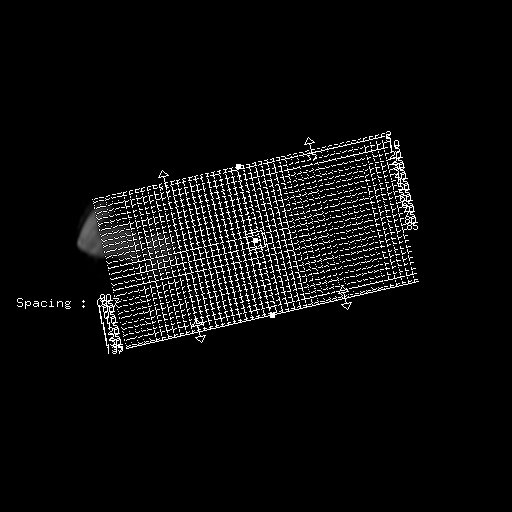

[22 of 48 positions shown; findings below may reference images not displayed]

FINDINGS: There is no pleural or pericardial effusion identified.

Several tiny cysts are identified within the liver. The gallbladder
appears within normal limits. There is no biliary dilatation. The
common bile duct has a normal caliber. The pancreatic duct is also
normal. Normal appearance of the pancreas. Normal appearance of the
spleen.

The adrenal glands are both within normal limits. The mild bilateral
renal cortical scarring is identified. No focal kidney lesion noted.
Small cyst is noted within the upper pole of the right kidney
measuring 6 mm.

There is no upper abdominal adenopathy. No free fluid identified.
The patient has a small hiatal hernia. The patient has a small
hiatal hernia.

Normal signal from within the bone marrow.
IMPRESSION: 1. No mass identified.

2. Right renal cyst.
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
## 2014-06-20 ENCOUNTER — Encounter (HOSPITAL_COMMUNITY): Payer: Self-pay

## 2014-06-20 ENCOUNTER — Emergency Department (HOSPITAL_COMMUNITY)
Admission: EM | Admit: 2014-06-20 | Discharge: 2014-06-20 | Disposition: A | Discharge status: Home / Self Care | Payer: Medicaid Other | Attending: Emergency Medicine | Admitting: Emergency Medicine

## 2014-06-20 ENCOUNTER — Emergency Department (HOSPITAL_COMMUNITY): Payer: Medicaid Other

## 2014-06-20 DIAGNOSIS — M5410 Radiculopathy, site unspecified: Secondary | ICD-10-CM | POA: Diagnosis not present

## 2014-06-20 DIAGNOSIS — Z880 Allergy status to penicillin: Secondary | ICD-10-CM | POA: Insufficient documentation

## 2014-06-20 DIAGNOSIS — M792 Neuralgia and neuritis, unspecified: Secondary | ICD-10-CM

## 2014-06-20 DIAGNOSIS — Z720 Tobacco use: Secondary | ICD-10-CM | POA: Diagnosis not present

## 2014-06-20 DIAGNOSIS — Z79899 Other long term (current) drug therapy: Secondary | ICD-10-CM | POA: Insufficient documentation

## 2014-06-20 DIAGNOSIS — M069 Rheumatoid arthritis, unspecified: Secondary | ICD-10-CM | POA: Diagnosis not present

## 2014-06-20 DIAGNOSIS — R202 Paresthesia of skin: Secondary | ICD-10-CM | POA: Insufficient documentation

## 2014-06-20 DIAGNOSIS — R200 Anesthesia of skin: Secondary | ICD-10-CM

## 2014-06-20 DIAGNOSIS — Z87828 Personal history of other (healed) physical injury and trauma: Secondary | ICD-10-CM | POA: Insufficient documentation

## 2014-06-20 DIAGNOSIS — I10 Essential (primary) hypertension: Secondary | ICD-10-CM | POA: Diagnosis not present

## 2014-06-20 LAB — BASIC METABOLIC PANEL
Anion gap: 8 (ref 5–15)
BUN: 21 mg/dL — AB (ref 6–20)
CALCIUM: 8.3 mg/dL — AB (ref 8.9–10.3)
CHLORIDE: 105 mmol/L (ref 101–111)
CO2: 26 mmol/L (ref 22–32)
Creatinine, Ser: 1.34 mg/dL — ABNORMAL HIGH (ref 0.44–1.00)
GFR, EST AFRICAN AMERICAN: 49 mL/min — AB (ref >60)
GFR, EST NON AFRICAN AMERICAN: 42 mL/min — AB (ref >60)
Glucose, Bld: 105 mg/dL — ABNORMAL HIGH (ref 65–99)
POTASSIUM: 3.9 mmol/L (ref 3.5–5.1)
Sodium: 139 mmol/L (ref 135–145)

## 2014-06-20 LAB — CBC WITH DIFFERENTIAL/PLATELET
Basophils Absolute: 0 10*3/uL (ref 0.0–0.1)
Basophils Relative: 1 % (ref 0–1)
Eosinophils Absolute: 0.4 10*3/uL (ref 0.0–0.7)
Eosinophils Relative: 6 % — ABNORMAL HIGH (ref 0–5)
HEMATOCRIT: 37.1 % (ref 36.0–46.0)
HEMOGLOBIN: 12 g/dL (ref 12.0–15.0)
LYMPHS ABS: 1.8 10*3/uL (ref 0.7–4.0)
Lymphocytes Relative: 29 % (ref 12–46)
MCH: 29.6 pg (ref 26.0–34.0)
MCHC: 32.3 g/dL (ref 30.0–36.0)
MCV: 91.6 fL (ref 78.0–100.0)
Monocytes Absolute: 0.4 10*3/uL (ref 0.1–1.0)
Monocytes Relative: 6 % (ref 3–12)
Neutro Abs: 3.5 10*3/uL (ref 1.7–7.7)
Neutrophils Relative %: 58 % (ref 43–77)
PLATELETS: 363 10*3/uL (ref 150–400)
RBC: 4.05 MIL/uL (ref 3.87–5.11)
RDW: 13.8 % (ref 11.5–15.5)
WBC: 6.1 10*3/uL (ref 4.0–10.5)

## 2014-06-20 LAB — I-STAT TROPONIN, ED: Troponin i, poc: 0.01 ng/mL (ref 0.00–0.08)

## 2014-06-20 MED ORDER — HYDROMORPHONE HCL 1 MG/ML IJ SOLN: 1.0000 mg | Freq: Once | INTRAMUSCULAR | Status: DC

## 2014-06-20 MED ORDER — OXYCODONE-ACETAMINOPHEN 5-325 MG PO TABS: 1.0000 | ORAL_TABLET | ORAL | Status: DC | PRN

## 2014-06-20 MED ORDER — METHYLPREDNISOLONE SODIUM SUCC 125 MG IJ SOLR: 125.0000 mg | Freq: Once | INTRAMUSCULAR | Status: DC

## 2014-06-20 MED ORDER — DEXAMETHASONE SODIUM PHOSPHATE 10 MG/ML IJ SOLN
10.0000 mg | Freq: Once | INTRAMUSCULAR | Status: DC
  Filled 2014-06-20: qty 1

## 2014-06-20 MED ORDER — DEXAMETHASONE SODIUM PHOSPHATE 10 MG/ML IJ SOLN
10.0000 mg | Freq: Once | INTRAMUSCULAR | Status: AC
  Administered 2014-06-20: 10 mg via INTRAMUSCULAR

## 2014-06-20 MED ORDER — HYDROMORPHONE HCL 1 MG/ML IJ SOLN
1.0000 mg | Freq: Once | INTRAMUSCULAR | Status: AC
  Administered 2014-06-20: 1 mg via INTRAMUSCULAR

## 2014-06-20 MED ORDER — HYDROMORPHONE HCL 1 MG/ML IJ SOLN
1.0000 mg | Freq: Once | INTRAMUSCULAR | Status: DC
  Filled 2014-06-20: qty 1

## 2014-06-20 MED ORDER — PREDNISONE 50 MG PO TABS: 50.0000 mg | ORAL_TABLET | Freq: Every day | ORAL | Status: DC

## 2014-06-20 NOTE — ED Notes (Signed)
Patient reports numbness in left arm and left leg which began last night.  Reports difficulties with walking but not more than usual.  Reports pain in left arm and left leg which feels like "pins and needles".

## 2014-06-20 NOTE — Discharge Instructions (Signed)
We saw you in the ER for the joint pain, numbness. All the results in the ER are normal, labs and imaging.  The joint pain is likely from you Rheumatoid. Please see the primary doctor soon. The workup in the ER is not complete, and is limited to screening for life threatening and emergent conditions only, so please see a primary care doctor for further evaluation.  Please return to the ER if your symptoms worsen; you have increased pain, fevers, chills, inability to keep any medications down, confusion. Otherwise see the outpatient doctor as requested.  Rheumatoid Arthritis Rheumatoid arthritis is a long-term (chronic) inflammatory disease that causes pain, swelling, and stiffness of the joints. It can affect the entire body, including the eyes and lungs. The effects of rheumatoid arthritis vary widely among those with the condition. CAUSES  The cause of rheumatoid arthritis is not known. It tends to run in families and is more common in women. Certain cells of the body's natural defense system (immune system) do not work properly and begin to attack healthy joints. It primarily involves the connective tissue that lines the joints (synovial membrane). This can cause damage to the joint. SYMPTOMS   Pain, stiffness, swelling, and decreased motion of many joints, especially in the hands and feet.  Stiffness that is worse in the morning. It may last 1-2 hours or longer.  Numbness and tingling in the hands.  Fatigue.  Loss of appetite.  Weight loss.  Low-grade fever.  Dry eyes and mouth.  Firm lumps (rheumatoid nodules) that grow beneath the skin in areas such as the elbows and hands. DIAGNOSIS  Diagnosis is based on the symptoms described, an exam, and blood tests. Sometimes, X-rays are helpful. TREATMENT  The goals of treatment are to relieve pain, reduce inflammation, and to slow down or stop joint damage and disability. Methods vary and may include:  Maintaining a balance of rest,  exercise, and proper nutrition.  Medicines:  Pain relievers (analgesics).  Corticosteroids and nonsteroidal anti-inflammatory drugs (NSAIDs) to reduce inflammation.  Disease-modifying antirheumatic drugs (DMARDs) to try to slow the course of the disease.  Biologic response modifiers to reduce inflammation and damage.  Physical therapy and occupational therapy.  Surgery for patients with severe joint damage. Joint replacement or fusing of joints may be needed.  Routine monitoring and ongoing care, such as office visits, blood and urine tests, and X-rays. HOME CARE INSTRUCTIONS   Remain physically active and reduce activity when the disease gets worse.  Eat a well-balanced diet.  Put heat on affected joints when you wake up and before activities. Keep the heat on the affected joint for as long as directed by your health care provider.  Put ice on affected joints following activities or exercising.  Put ice in a plastic bag.  Place a towel between your skin and the bag.  Leave the ice on for 15-20 minutes, 3-4 times per day, or as directed by your health care provider.  Take medicines and supplements only as directed by your health care provider.  Use splints as directed by your health care provider. Splints help maintain joint position and function.  Do not sleep with pillows under your knees. This may lead to spasms.  Participate in a self-management program to keep current with the latest treatment and coping skills. SEEK IMMEDIATE MEDICAL CARE IF:  You have fainting episodes.  You have periods of extreme weakness.  You rapidly develop a hot, painful joint that is more severe than usual joint  aches.  You have chills.  You have a fever. FOR MORE INFORMATION   [employer]: www.rheumatology.[place]: www.arthritis.org Document Released: 12/26/1999 Document Revised: 05/14/2013 Document Reviewed: 02/03/2011 [facility] Patient  Information 2015 [place], [place]. This information is not intended to replace advice given to you by your health care provider. Make sure you discuss any questions you have with your health care provider.

## 2014-06-20 NOTE — ED Notes (Signed)
Pt complains of RA pain in her hands and knees, she is out of her meds, she also states that her left arm is numb and that's new for her
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
## 2014-08-06 IMAGING — MG MM SCREEN MAMMOGRAM BILATERAL
4 series · 4 of 4 positions shown · non-contrast
Comparison: None

CLINICAL DATA: Screening.

EXAM:
DIGITAL SCREENING BILATERAL MAMMOGRAM WITH CAD

[L MLO]
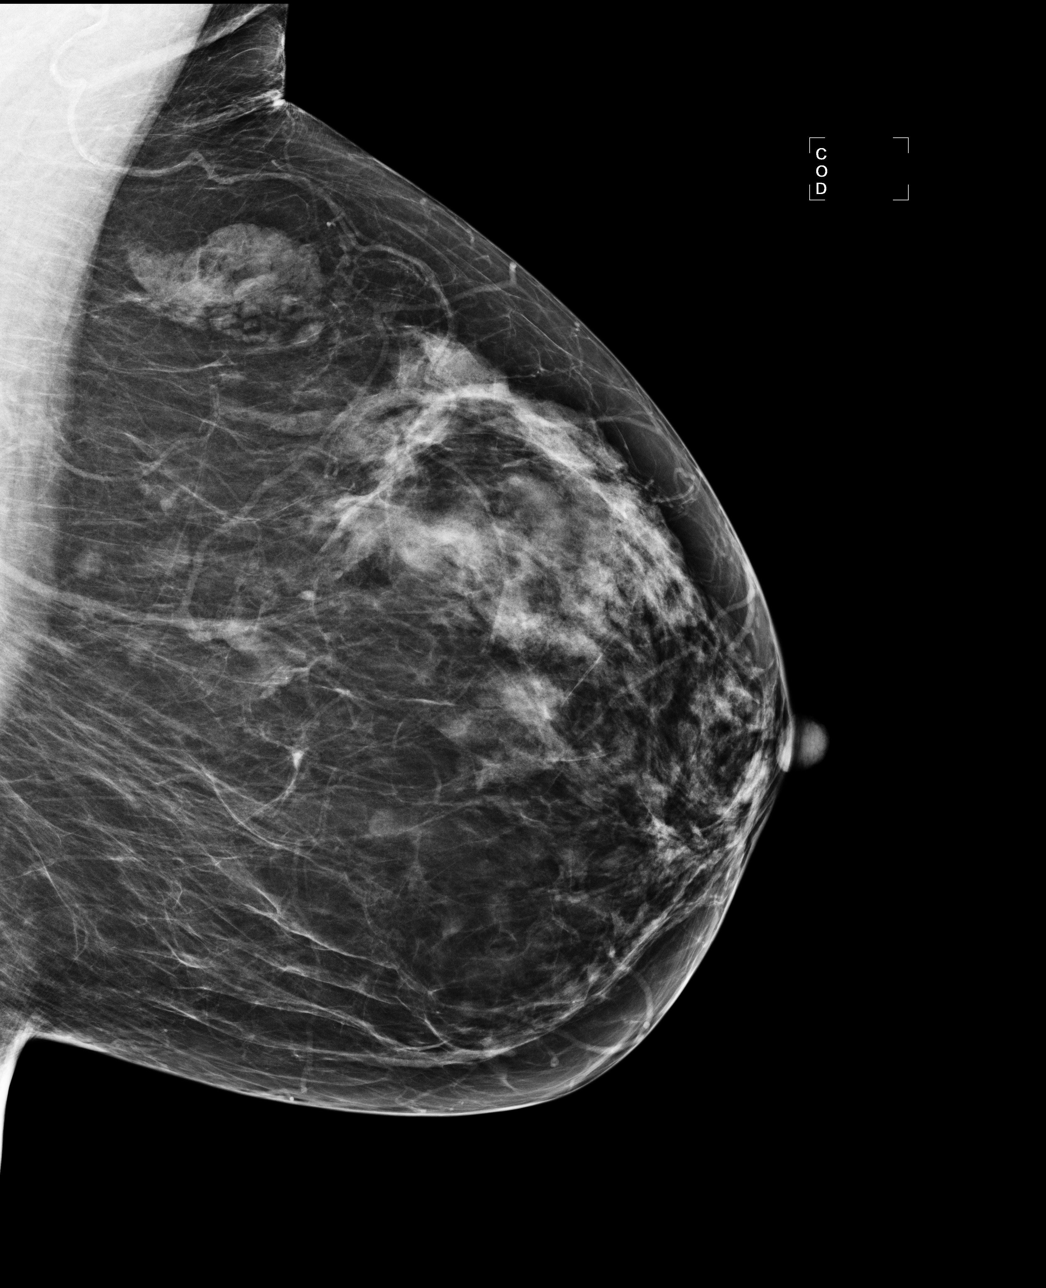

[R MLO]
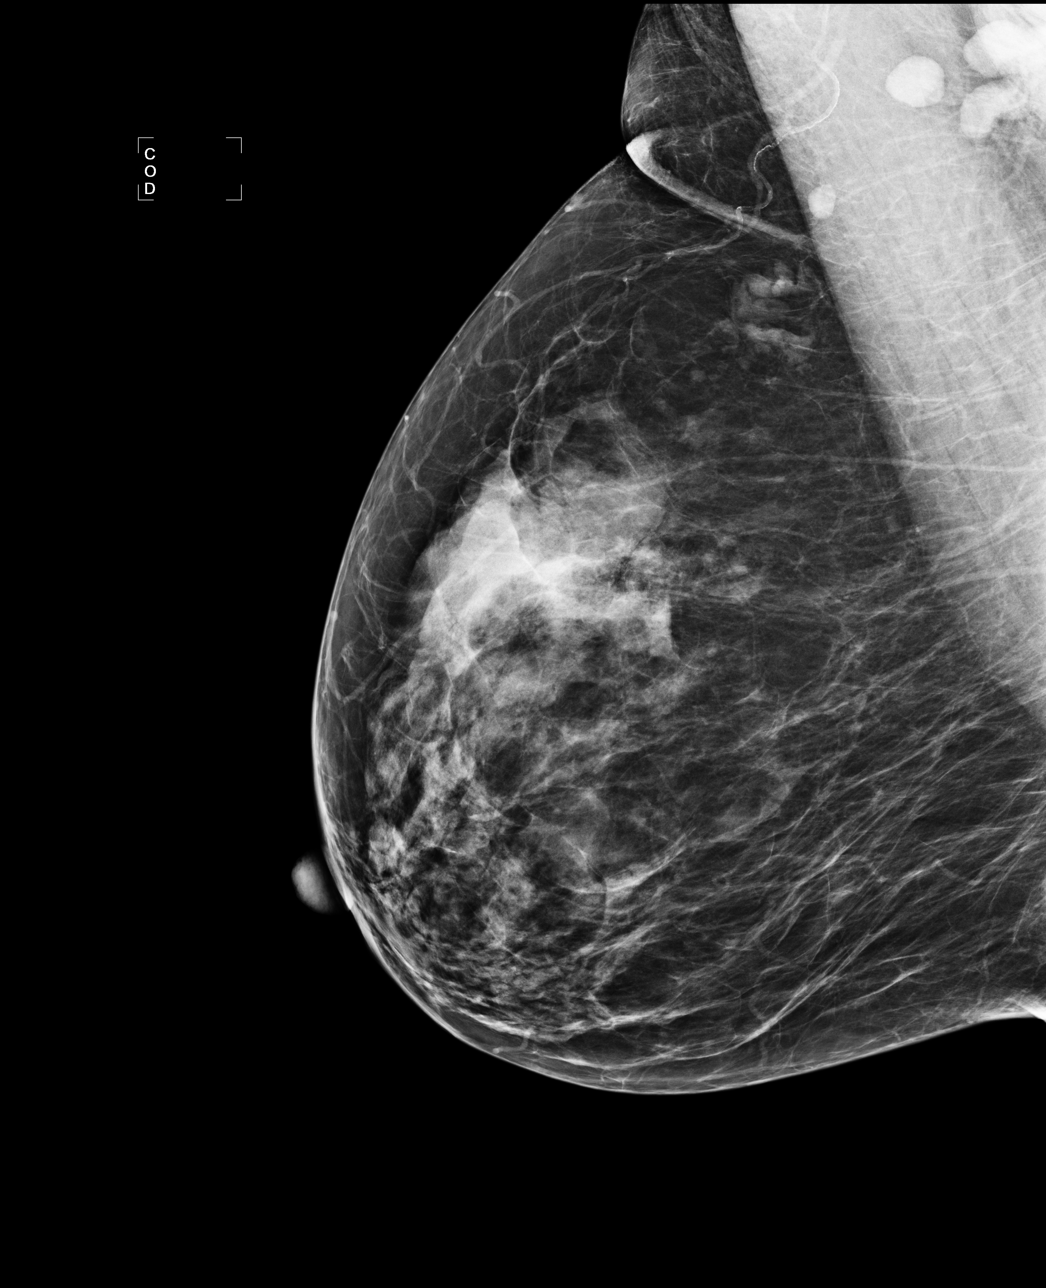

[L CC]
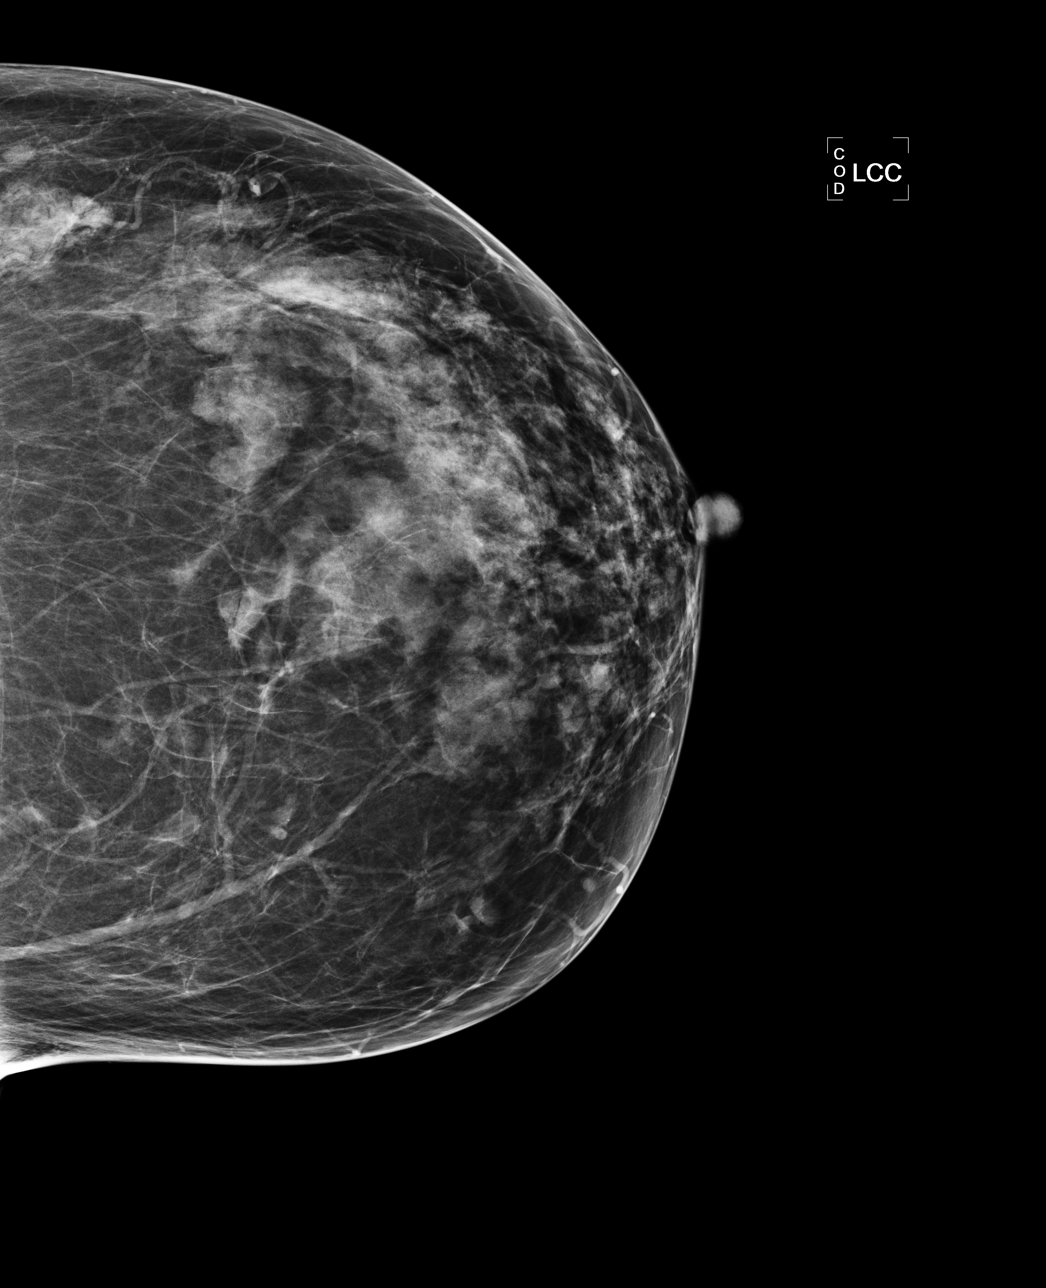

[R CC]
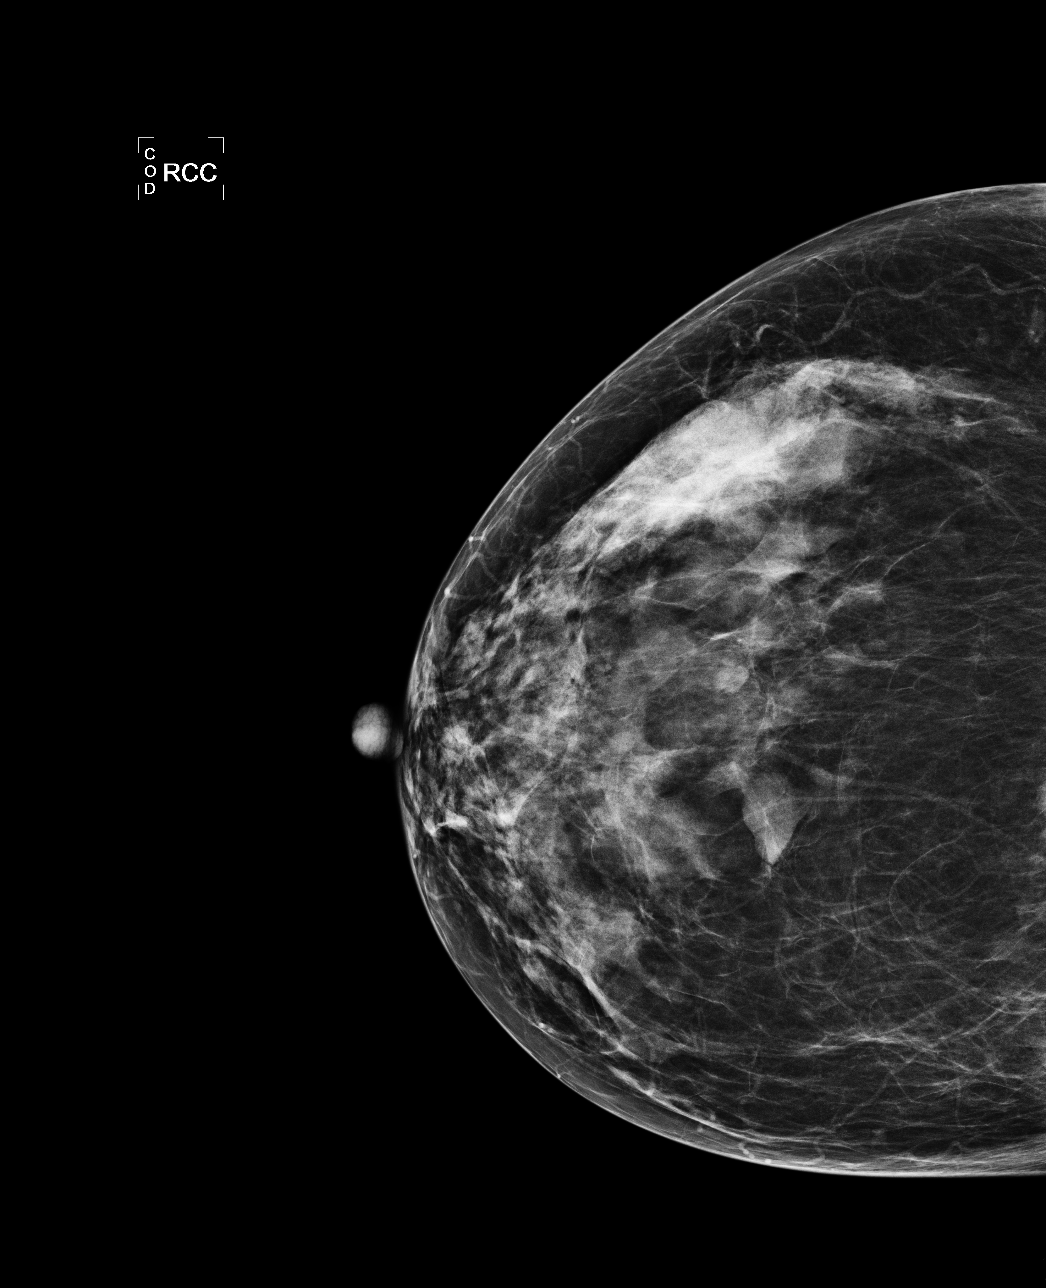

[4 of 4 positions shown; findings below may reference images not displayed]

ACR Breast Density Category c: The breasts are heterogeneously
dense, which may obscure small masses.
FINDINGS: In the left breast calcifications and possible asymmetry warrant
further imaging evaluation with magnified views and spot compression
views and possibly ultrasound. In the right breast, possible mass
and lymph nodes warrant further imaging evaluation with spot
compression views and possibly ultrasound. Images were processed
with CAD.
IMPRESSION: Further imaging evaluation is suggested for possible asymmetry and
calcificationsin the left breast.

Further imaging evaluation is suggested for possible mass and lymph
nodes.In the right breast.

RECOMMENDATION:
Diagnostic mammogram and possibly ultrasound of both breasts.
(Code:IV-H-PP8)

The patient will be contacted regarding the findings, and additional
imaging will be scheduled.

BI-RADS CATEGORY  0: Incomplete. Need additional imaging evaluation
and/or prior mammograms for comparison.

## 2014-08-20 ENCOUNTER — Encounter (HOSPITAL_COMMUNITY): Payer: Self-pay | Admitting: Emergency Medicine

## 2014-08-20 ENCOUNTER — Emergency Department (HOSPITAL_COMMUNITY): Payer: Medicaid Other

## 2014-08-20 ENCOUNTER — Observation Stay (HOSPITAL_COMMUNITY)
Admission: EM | Admit: 2014-08-20 | Discharge: 2014-08-22 | Disposition: A | Discharge status: Home / Self Care | Payer: Medicaid Other | Attending: Internal Medicine | Admitting: Internal Medicine

## 2014-08-20 ENCOUNTER — Observation Stay (HOSPITAL_COMMUNITY): Payer: Medicaid Other

## 2014-08-20 DIAGNOSIS — Z79899 Other long term (current) drug therapy: Secondary | ICD-10-CM | POA: Diagnosis not present

## 2014-08-20 DIAGNOSIS — N179 Acute kidney failure, unspecified: Secondary | ICD-10-CM | POA: Diagnosis present

## 2014-08-20 DIAGNOSIS — R0789 Other chest pain: Principal | ICD-10-CM | POA: Insufficient documentation

## 2014-08-20 DIAGNOSIS — R079 Chest pain, unspecified: Secondary | ICD-10-CM | POA: Diagnosis not present

## 2014-08-20 DIAGNOSIS — N390 Urinary tract infection, site not specified: Secondary | ICD-10-CM | POA: Diagnosis not present

## 2014-08-20 DIAGNOSIS — N183 Chronic kidney disease, stage 3 unspecified: Secondary | ICD-10-CM | POA: Diagnosis present

## 2014-08-20 DIAGNOSIS — Z791 Long term (current) use of non-steroidal anti-inflammatories (NSAID): Secondary | ICD-10-CM | POA: Diagnosis not present

## 2014-08-20 DIAGNOSIS — M069 Rheumatoid arthritis, unspecified: Secondary | ICD-10-CM | POA: Diagnosis present

## 2014-08-20 DIAGNOSIS — R112 Nausea with vomiting, unspecified: Secondary | ICD-10-CM | POA: Diagnosis not present

## 2014-08-20 DIAGNOSIS — I2510 Atherosclerotic heart disease of native coronary artery without angina pectoris: Secondary | ICD-10-CM | POA: Diagnosis not present

## 2014-08-20 DIAGNOSIS — Z79891 Long term (current) use of opiate analgesic: Secondary | ICD-10-CM | POA: Insufficient documentation

## 2014-08-20 DIAGNOSIS — F1410 Cocaine abuse, uncomplicated: Secondary | ICD-10-CM | POA: Diagnosis present

## 2014-08-20 DIAGNOSIS — R0602 Shortness of breath: Secondary | ICD-10-CM | POA: Diagnosis not present

## 2014-08-20 DIAGNOSIS — I10 Essential (primary) hypertension: Secondary | ICD-10-CM | POA: Diagnosis present

## 2014-08-20 DIAGNOSIS — Z7952 Long term (current) use of systemic steroids: Secondary | ICD-10-CM | POA: Insufficient documentation

## 2014-08-20 DIAGNOSIS — R1011 Right upper quadrant pain: Secondary | ICD-10-CM

## 2014-08-20 DIAGNOSIS — F17210 Nicotine dependence, cigarettes, uncomplicated: Secondary | ICD-10-CM | POA: Insufficient documentation

## 2014-08-20 DIAGNOSIS — Z7982 Long term (current) use of aspirin: Secondary | ICD-10-CM | POA: Insufficient documentation

## 2014-08-20 DIAGNOSIS — I129 Hypertensive chronic kidney disease with stage 1 through stage 4 chronic kidney disease, or unspecified chronic kidney disease: Secondary | ICD-10-CM | POA: Diagnosis not present

## 2014-08-20 DIAGNOSIS — Z7951 Long term (current) use of inhaled steroids: Secondary | ICD-10-CM | POA: Diagnosis not present

## 2014-08-20 DIAGNOSIS — E872 Acidosis: Secondary | ICD-10-CM | POA: Insufficient documentation

## 2014-08-20 HISTORY — DX: Nausea with vomiting, unspecified: R11.2

## 2014-08-20 LAB — URINALYSIS, ROUTINE W REFLEX MICROSCOPIC
BILIRUBIN URINE: NEGATIVE
Glucose, UA: NEGATIVE mg/dL
KETONES UR: NEGATIVE mg/dL
Nitrite: NEGATIVE
Protein, ur: NEGATIVE mg/dL
SPECIFIC GRAVITY, URINE: 1.018 (ref 1.005–1.030)
Urobilinogen, UA: 0.2 mg/dL (ref 0.0–1.0)
pH: 6 (ref 5.0–8.0)

## 2014-08-20 LAB — URINE MICROSCOPIC-ADD ON

## 2014-08-20 LAB — CBC WITH DIFFERENTIAL/PLATELET
Basophils Absolute: 0 10*3/uL (ref 0.0–0.1)
Basophils Relative: 0 % (ref 0–1)
EOS ABS: 0 10*3/uL (ref 0.0–0.7)
Eosinophils Relative: 0 % (ref 0–5)
HEMATOCRIT: 38 % (ref 36.0–46.0)
HEMOGLOBIN: 12.5 g/dL (ref 12.0–15.0)
LYMPHS ABS: 2 10*3/uL (ref 0.7–4.0)
Lymphocytes Relative: 14 % (ref 12–46)
MCH: 30.1 pg (ref 26.0–34.0)
MCHC: 32.9 g/dL (ref 30.0–36.0)
MCV: 91.6 fL (ref 78.0–100.0)
MONO ABS: 0.8 10*3/uL (ref 0.1–1.0)
MONOS PCT: 6 % (ref 3–12)
NEUTROS ABS: 11.3 10*3/uL — AB (ref 1.7–7.7)
Neutrophils Relative %: 80 % — ABNORMAL HIGH (ref 43–77)
PLATELETS: 216 10*3/uL (ref 150–400)
RBC: 4.15 MIL/uL (ref 3.87–5.11)
RDW: 15.6 % — AB (ref 11.5–15.5)
WBC: 14.1 10*3/uL — ABNORMAL HIGH (ref 4.0–10.5)

## 2014-08-20 LAB — COMPREHENSIVE METABOLIC PANEL
ALBUMIN: 3 g/dL — AB (ref 3.5–5.0)
ALT: 14 U/L (ref 14–54)
ANION GAP: 8 (ref 5–15)
AST: 17 U/L (ref 15–41)
Alkaline Phosphatase: 73 U/L (ref 38–126)
BILIRUBIN TOTAL: 0.3 mg/dL (ref 0.3–1.2)
BUN: 22 mg/dL — AB (ref 6–20)
CALCIUM: 8.7 mg/dL — AB (ref 8.9–10.3)
CHLORIDE: 111 mmol/L (ref 101–111)
CO2: 20 mmol/L — AB (ref 22–32)
Creatinine, Ser: 1.27 mg/dL — ABNORMAL HIGH (ref 0.44–1.00)
GFR, EST AFRICAN AMERICAN: 52 mL/min — AB (ref >60)
GFR, EST NON AFRICAN AMERICAN: 45 mL/min — AB (ref >60)
GLUCOSE: 117 mg/dL — AB (ref 65–99)
POTASSIUM: 4.2 mmol/L (ref 3.5–5.1)
SODIUM: 139 mmol/L (ref 135–145)
Total Protein: 5.9 g/dL — ABNORMAL LOW (ref 6.5–8.1)

## 2014-08-20 LAB — RAPID URINE DRUG SCREEN, HOSP PERFORMED
Amphetamines: NOT DETECTED
Barbiturates: NOT DETECTED
Benzodiazepines: NOT DETECTED
COCAINE: POSITIVE — AB
Opiates: NOT DETECTED
Tetrahydrocannabinol: NOT DETECTED

## 2014-08-20 LAB — D-DIMER, QUANTITATIVE (NOT AT ARMC)

## 2014-08-20 LAB — TROPONIN I: Troponin I: <0.03 ng/mL (ref <0.031)

## 2014-08-20 MED ORDER — CEFTRIAXONE SODIUM 1 G IJ SOLR
1.0000 g | Freq: Once | INTRAMUSCULAR | Status: AC
  Administered 2014-08-20: 1 g via INTRAVENOUS
  Filled 2014-08-20: qty 10

## 2014-08-20 MED ORDER — LORAZEPAM 1 MG PO TABS
1.0000 mg | ORAL_TABLET | Freq: Once | ORAL | Status: AC
  Administered 2014-08-20: 1 mg via ORAL
  Filled 2014-08-20: qty 1

## 2014-08-20 MED ORDER — NITROGLYCERIN 0.4 MG/HR TD PT24
0.4000 mg | MEDICATED_PATCH | TRANSDERMAL | Status: DC
  Administered 2014-08-20 – 2014-08-21 (×2): 0.4 mg via TRANSDERMAL
  Filled 2014-08-20 (×4): qty 1

## 2014-08-20 MED ORDER — DEXTROSE 5 % IV SOLN
1.0000 g | INTRAVENOUS | Status: DC
  Administered 2014-08-21: 1 g via INTRAVENOUS
  Filled 2014-08-20 (×2): qty 10

## 2014-08-20 MED ORDER — ASPIRIN 81 MG PO CHEW
324.0000 mg | CHEWABLE_TABLET | Freq: Once | ORAL | Status: DC
  Filled 2014-08-20: qty 4
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
## 2014-08-20 NOTE — ED Notes (Signed)
Patient transported to Ultrasound 
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
## 2014-08-20 NOTE — ED Notes (Signed)
Pt to ED via [facility] for sudden onset substernal chest pain 10/10 radiating into left arm; cocaine use last Friday. Pt reports this pain is similar to pain she experienced 2 weeks ago when she had an MI; 324 aspirin, 1 nitro - reported short relief then pain returned. A/o x4. 100 bpm, regular; 100% RA; 108/68

## 2014-08-21 ENCOUNTER — Observation Stay (HOSPITAL_COMMUNITY): Payer: Medicaid Other

## 2014-08-21 DIAGNOSIS — R0782 Intercostal pain: Secondary | ICD-10-CM

## 2014-08-21 DIAGNOSIS — R112 Nausea with vomiting, unspecified: Secondary | ICD-10-CM

## 2014-08-21 DIAGNOSIS — I25110 Atherosclerotic heart disease of native coronary artery with unstable angina pectoris: Secondary | ICD-10-CM

## 2014-08-21 DIAGNOSIS — F1410 Cocaine abuse, uncomplicated: Secondary | ICD-10-CM

## 2014-08-21 DIAGNOSIS — R079 Chest pain, unspecified: Secondary | ICD-10-CM | POA: Diagnosis not present

## 2014-08-21 DIAGNOSIS — I10 Essential (primary) hypertension: Secondary | ICD-10-CM

## 2014-08-21 DIAGNOSIS — N183 Chronic kidney disease, stage 3 (moderate): Secondary | ICD-10-CM

## 2014-08-21 LAB — HEPATIC FUNCTION PANEL
ALBUMIN: 2.8 g/dL — AB (ref 3.5–5.0)
ALT: 13 U/L — ABNORMAL LOW (ref 14–54)
AST: 16 U/L (ref 15–41)
Alkaline Phosphatase: 67 U/L (ref 38–126)
Total Bilirubin: 0.4 mg/dL (ref 0.3–1.2)
Total Protein: 5.5 g/dL — ABNORMAL LOW (ref 6.5–8.1)

## 2014-08-21 LAB — BASIC METABOLIC PANEL
Anion gap: 8 (ref 5–15)
BUN: 20 mg/dL (ref 6–20)
CO2: 24 mmol/L (ref 22–32)
CREATININE: 1.13 mg/dL — AB (ref 0.44–1.00)
Calcium: 8.2 mg/dL — ABNORMAL LOW (ref 8.9–10.3)
Chloride: 107 mmol/L (ref 101–111)
GFR calc Af Amer: 60 mL/min — ABNORMAL LOW (ref >60)
GFR calc non Af Amer: 52 mL/min — ABNORMAL LOW (ref >60)
Glucose, Bld: 125 mg/dL — ABNORMAL HIGH (ref 65–99)
Potassium: 3.8 mmol/L (ref 3.5–5.1)
Sodium: 139 mmol/L (ref 135–145)

## 2014-08-21 LAB — LIPID PANEL
CHOL/HDL RATIO: 2.7 ratio
Cholesterol: 203 mg/dL — ABNORMAL HIGH (ref 0–200)
HDL: 75 mg/dL (ref >40)
LDL CALC: 113 mg/dL — AB (ref 0–99)
Triglycerides: 73 mg/dL (ref <150)
VLDL: 15 mg/dL (ref 0–40)

## 2014-08-21 LAB — TROPONIN I
Troponin I: <0.03 ng/mL (ref <0.031)
Troponin I: <0.03 ng/mL (ref <0.031)
Troponin I: <0.03 ng/mL (ref <0.031)

## 2014-08-21 LAB — CBC WITH DIFFERENTIAL/PLATELET
BASOS PCT: 0 % (ref 0–1)
Basophils Absolute: 0 10*3/uL (ref 0.0–0.1)
Eosinophils Absolute: 0.2 10*3/uL (ref 0.0–0.7)
Eosinophils Relative: 2 % (ref 0–5)
HCT: 37.6 % (ref 36.0–46.0)
HEMOGLOBIN: 12.5 g/dL (ref 12.0–15.0)
Lymphocytes Relative: 22 % (ref 12–46)
Lymphs Abs: 2.2 10*3/uL (ref 0.7–4.0)
MCH: 30.2 pg (ref 26.0–34.0)
MCHC: 33.2 g/dL (ref 30.0–36.0)
MCV: 90.8 fL (ref 78.0–100.0)
MONO ABS: 0.5 10*3/uL (ref 0.1–1.0)
Monocytes Relative: 5 % (ref 3–12)
NEUTROS PCT: 71 % (ref 43–77)
Neutro Abs: 6.9 10*3/uL (ref 1.7–7.7)
Platelets: 261 10*3/uL (ref 150–400)
RBC: 4.14 MIL/uL (ref 3.87–5.11)
RDW: 15.5 % (ref 11.5–15.5)
WBC: 9.7 10*3/uL (ref 4.0–10.5)

## 2014-08-21 LAB — LIPASE, BLOOD: Lipase: 20 U/L — ABNORMAL LOW (ref 22–51)

## 2014-08-21 MED ORDER — LORATADINE 10 MG PO TABS
10.0000 mg | ORAL_TABLET | Freq: Every day | ORAL | Status: DC
  Administered 2014-08-21 – 2014-08-22 (×2): 10 mg via ORAL
  Filled 2014-08-21 (×2): qty 1

## 2014-08-21 MED ORDER — AMITRIPTYLINE HCL 25 MG PO TABS
25.0000 mg | ORAL_TABLET | Freq: Every day | ORAL | Status: DC
  Administered 2014-08-21: 25 mg via ORAL
  Filled 2014-08-21 (×2): qty 1

## 2014-08-21 MED ORDER — LISINOPRIL 20 MG PO TABS
20.0000 mg | ORAL_TABLET | Freq: Every day | ORAL | Status: DC
  Administered 2014-08-21 – 2014-08-22 (×2): 20 mg via ORAL
  Filled 2014-08-21 (×2): qty 1

## 2014-08-21 MED ORDER — OXYCODONE-ACETAMINOPHEN 5-325 MG PO TABS: 1.0000 | ORAL_TABLET | Freq: Every day | ORAL | Status: DC | PRN

## 2014-08-21 MED ORDER — ALBUTEROL SULFATE (2.5 MG/3ML) 0.083% IN NEBU
3.0000 mL | INHALATION_SOLUTION | Freq: Four times a day (QID) | RESPIRATORY_TRACT | Status: DC | PRN
  Administered 2014-08-22: 3 mL via RESPIRATORY_TRACT
  Filled 2014-08-21: qty 3

## 2014-08-21 MED ORDER — HYDROCORTISONE SOD SUCCINATE 100 MG PF FOR IT USE: 50.0000 mg | Freq: Once | INTRAMUSCULAR | Status: DC

## 2014-08-21 MED ORDER — PANTOPRAZOLE SODIUM 40 MG PO TBEC
40.0000 mg | DELAYED_RELEASE_TABLET | Freq: Every day | ORAL | Status: DC
  Administered 2014-08-21 – 2014-08-22 (×2): 40 mg via ORAL
  Filled 2014-08-21 (×2): qty 1

## 2014-08-21 MED ORDER — ONDANSETRON HCL 4 MG/2ML IJ SOLN
4.0000 mg | Freq: Four times a day (QID) | INTRAMUSCULAR | Status: DC | PRN
  Administered 2014-08-22: 4 mg via INTRAVENOUS
  Filled 2014-08-21: qty 2

## 2014-08-21 MED ORDER — ASPIRIN EC 325 MG PO TBEC
325.0000 mg | DELAYED_RELEASE_TABLET | Freq: Every day | ORAL | Status: DC
  Filled 2014-08-21: qty 1

## 2014-08-21 MED ORDER — SODIUM CHLORIDE 0.9 % IV SOLN
INTRAVENOUS | Status: AC
  Administered 2014-08-21: 03:00:00 via INTRAVENOUS

## 2014-08-21 MED ORDER — ACETAMINOPHEN 325 MG PO TABS: 650.0000 mg | ORAL_TABLET | ORAL | Status: DC | PRN

## 2014-08-21 MED ORDER — OXYBUTYNIN CHLORIDE 5 MG PO TABS
5.0000 mg | ORAL_TABLET | Freq: Two times a day (BID) | ORAL | Status: DC
  Administered 2014-08-21 – 2014-08-22 (×3): 5 mg via ORAL
  Filled 2014-08-21 (×4): qty 1

## 2014-08-21 MED ORDER — AMLODIPINE BESYLATE 5 MG PO TABS
5.0000 mg | ORAL_TABLET | Freq: Every day | ORAL | Status: DC
  Administered 2014-08-21 – 2014-08-22 (×2): 5 mg via ORAL
  Filled 2014-08-21 (×2): qty 1

## 2014-08-21 MED ORDER — NICOTINE 14 MG/24HR TD PT24
14.0000 mg | MEDICATED_PATCH | Freq: Every day | TRANSDERMAL | Status: DC
  Administered 2014-08-21 – 2014-08-22 (×2): 14 mg via TRANSDERMAL
  Filled 2014-08-21 (×2): qty 1

## 2014-08-21 MED ORDER — MORPHINE SULFATE 2 MG/ML IJ SOLN
INTRAMUSCULAR | Status: AC
  Filled 2014-08-21: qty 1

## 2014-08-21 MED ORDER — MORPHINE SULFATE 2 MG/ML IJ SOLN
2.0000 mg | INTRAMUSCULAR | Status: DC | PRN
  Administered 2014-08-21: 2 mg via INTRAVENOUS
  Filled 2014-08-21: qty 1

## 2014-08-21 MED ORDER — PREDNISONE 10 MG PO TABS
10.0000 mg | ORAL_TABLET | Freq: Every day | ORAL | Status: DC
  Administered 2014-08-21 – 2014-08-22 (×2): 10 mg via ORAL
  Filled 2014-08-21 (×4): qty 1

## 2014-08-21 MED ORDER — BUDESONIDE 0.5 MG/2ML IN SUSP
0.5000 mg | Freq: Two times a day (BID) | RESPIRATORY_TRACT | Status: DC
  Administered 2014-08-21 (×2): 0.5 mg via RESPIRATORY_TRACT
  Filled 2014-08-21 (×6): qty 2

## 2014-08-21 MED ORDER — ASPIRIN 81 MG PO CHEW
81.0000 mg | CHEWABLE_TABLET | Freq: Every day | ORAL | Status: DC
  Administered 2014-08-21 – 2014-08-22 (×2): 81 mg via ORAL
  Filled 2014-08-21: qty 1

## 2014-08-21 MED ORDER — BECLOMETHASONE DIPROPIONATE 80 MCG/ACT IN AERS
2.0000 | INHALATION_SPRAY | Freq: Two times a day (BID) | RESPIRATORY_TRACT | Status: DC
Start: 2014-08-21 — End: 2014-08-21
  Filled 2014-08-21: qty 8.7

## 2014-08-21 MED ORDER — HYDROCORTISONE NA SUCCINATE PF 100 MG IJ SOLR
50.0000 mg | Freq: Once | INTRAMUSCULAR | Status: AC
  Administered 2014-08-21: 50 mg via INTRAVENOUS
  Filled 2014-08-21: qty 1

## 2014-08-21 MED ORDER — GI COCKTAIL ~~LOC~~
30.0000 mL | Freq: Once | ORAL | Status: AC
  Administered 2014-08-21: 30 mL via ORAL
  Filled 2014-08-21: qty 30

## 2014-08-21 MED ORDER — ATORVASTATIN CALCIUM 40 MG PO TABS
40.0000 mg | ORAL_TABLET | Freq: Every day | ORAL | Status: DC
  Administered 2014-08-21: 40 mg via ORAL
  Filled 2014-08-21 (×2): qty 1
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
## 2014-08-21 NOTE — Progress Notes (Signed)
Echocardiogram 2D Echocardiogram has been performed.  [name] 08/21/2014, 2:55 PM

## 2014-08-21 NOTE — Progress Notes (Signed)
[facility]                                                                              Patient Demographics  [name], is a 59 y.o. female, DOB - [birth year], WCB:[id number]  Admit date - 08/20/2014   Admitting Physician Rise Patience, MD  Outpatient Primary MD for the patient is Philis Fendt, MD  LOS -    Chief Complaint  Patient presents with  . Chest Pain       Brief HPI   [name] is a 59 y.o. female with history of hypertension, chronic kidney disease, rheumatoid arthritis and cocaine abuse presents to the ER because of chest pain. Patient states she has been having chest pain over the last 2 days which was retrosternal pressure-like present even at rest last for a few minutes each time. In addition patient also has been having persistent nausea and vomiting multiple times over the last couple of days. Patient also was noticing some epigastric and right upper quadrant pain. Denies any diarrhea. Patient was admitted last month at [facility] for chest pain and had cardiac cath on 07/27/2014 which revealed 80% ostial stenosis and a small diagonal branch which was not amicable to PCI. Patient was placed on antiplatelets agents and Lipitor and medically managed. Patient stated she has taken cocaine 4-5 days ago. Patient was admitted for further management.    Assessment & Plan    Principal Problem:   Chest pain with underlying history of CAD, cocaine use, hypertension - Continues to complain of chest pain, serial troponins negative. Cardiology consulted, patient had a recent [place] normal and cardiac cath on 7/16 revealed 80% ostial stenosis and small diagonal branch not amenable to PCI.  - At this time cardiology recommended 2-D echocardiogram, continue aspirin, no beta blockers secondary to cocaine use -Continue Lipitor, lisinopril, Imdur - Continue pain control - Patient counseled strongly on smoking and cocaine cessation   Active  Problems:   Rheumatoid arthritis - Currently stable, given one dose of stress dose to records, no further nausea and vomiting    HTN (hypertension), benign - Currently stable continue home medications, lisinopril, Imdur    Cocaine abuse - Patient strongly counseled on cocaine cessation    Nausea & vomiting - Currently improving, abdominal ultrasound showed prominence of pancreatic duct without any mass or inflammatory focus. We will check lipase.    CKD (chronic kidney disease) stage 3, GFR 30-59 ml/min - Creatinine at baseline    UTI (lower urinary tract infection) - Follow urine culture and sensitivities, placed on IV Rocephin   Code Status: Full code  Family Communication: Discussed in detail with the patient, all imaging results, lab results explained to the patient  Disposition Plan: Hopefully DC home in a.m. if stable  Time Spent in minutes  25 minutes  Procedures  None  Consults   Cardiology  DVT Prophylaxis SCD's  Medications  Scheduled Meds: . amitriptyline  25 mg Oral QHS  . amLODipine  5 mg Oral Daily  . aspirin  324 mg Oral Once  . aspirin  81 mg Oral Daily  . atorvastatin  40 mg  Oral q1800  . budesonide (PULMICORT) nebulizer solution  0.5 mg Nebulization BID  . cefTRIAXone (ROCEPHIN)  IV  1 g Intravenous Q24H  . lisinopril  20 mg Oral Daily  . loratadine  10 mg Oral Daily  . morphine      . nicotine  14 mg Transdermal Daily  . nitroGLYCERIN  0.4 mg Transdermal Q24H  . oxybutynin  5 mg Oral BID  . pantoprazole  40 mg Oral Daily  . predniSONE  10 mg Oral Q breakfast   Continuous Infusions: . sodium chloride 75 mL/hr at 08/21/14 0247   PRN Meds:.acetaminophen, albuterol, morphine injection, ondansetron (ZOFRAN) IV, oxyCODONE-acetaminophen   Antibiotics   Anti-infectives    Start     Dose/Rate Route Frequency Ordered Stop   08/21/14 2100  cefTRIAXone (ROCEPHIN) 1 g in dextrose 5 % 50 mL IVPB     1 g 100 mL/hr over 30 Minutes Intravenous Every 24  hours 08/20/14 2218     08/20/14 2045  cefTRIAXone (ROCEPHIN) 1 g in dextrose 5 % 50 mL IVPB     1 g 100 mL/hr over 30 Minutes Intravenous  Once 08/20/14 2031 08/20/14 2200        Subjective:   Gloris Shiroma was seen and examined today. Continues to complain of chest pain, 4/10, no diaphoresis, palpitations, no nausea, vomiting, abdominal pain. Patient denies dizziness, shortness of breath, abdominal pain, N/V/D/C, new weakness, numbess, tingling. No acute events overnight.    Objective:   Blood pressure 130/70, pulse 62, temperature 98.7 F (37.1 C), temperature source Oral, resp. rate 18, height 5\' 6"  (1.676 m), weight 77.565 kg (171 lb), SpO2 99 %.  Wt Readings from Last 3 Encounters:  08/21/14 77.565 kg (171 lb)  06/20/14 72.576 kg (160 lb)  02/28/14 74.844 kg (165 lb)     Intake/Output Summary (Last 24 hours) at 08/21/14 1051 Last data filed at 08/21/14 1006  Gross per 24 hour  Intake 916.25 ml  Output    800 ml  Net 116.25 ml    Exam  General: Alert and oriented x 3, NAD  HEENT:  PERRLA, EOMI, Anicteric Sclera, mucous membranes moist.   Neck: Supple, no JVD, no masses  CVS: S1 S2 auscultated, no rubs, murmurs or gallops. Regular rate and rhythm.  Respiratory: Clear to auscultation bilaterally, no wheezing, rales or rhonchi  Abdomen: Soft, nontender, nondistended, + bowel sounds  Ext: no cyanosis clubbing or edema  Neuro: AAOx3, Cr N's II- XII. Strength 5/5 upper and lower extremities bilaterally  Skin: No rashes  Psych:Anxious,  alert and oriented x3    Data Review   Micro Results No results found for this or any previous visit (from the past 240 hour(s)).  Radiology Reports Dg Chest 2 View  08/20/2014   CLINICAL DATA:  Acute chest pain.  EXAM: CHEST  2 VIEW  COMPARISON:  February 28, 2014.  FINDINGS: The heart size and mediastinal contours are within normal limits. No pneumothorax or pleural effusion is noted. Right lung is clear. Minimal left  basilar subsegmental atelectasis is noted. The visualized skeletal structures are unremarkable.  IMPRESSION: Minimal left basilar subsegmental atelectasis.   Electronically Signed   By: Marijo Conception, M.D.   On: 08/20/2014 18:04   US Abdomen Complete  08/20/2014   CLINICAL DATA:  Chest and upper abdominal pain  EXAM: ULTRASOUND ABDOMEN COMPLETE  COMPARISON:  CT abdomen and pelvis November 18, 2013  FINDINGS: Gallbladder: No gallstones or wall thickening visualized. There is no pericholecystic  fluid. No sonographic Murphy sign noted.  Common bile duct: Diameter: 3 mm. There is no intrahepatic, common hepatic, or common bile duct dilatation.  Liver: No focal lesion identified. Within normal limits in parenchymal echogenicity.  IVC: No abnormality visualized.  Pancreas: No mass or inflammatory focus. Pancreatic duct is prominent measuring 5 mm. No obstructing lesion is seen by ultrasound in the region of the pancreatic duct.  Spleen: Size and appearance within normal limits.  Right Kidney: Length: 10.1 cm. Echogenicity within normal limits. No mass or hydronephrosis visualized.  Left Kidney: Length: 10.7 cm. Echogenicity within normal limits. No hydronephrosis visualized. There is a cyst in the mid left kidney measuring 1.8 x 1.2 x 1.8 cm  Abdominal aorta: No aneurysm visualized.  Other findings: No demonstrable ascites.  IMPRESSION: Prominence of the pancreatic duct without mass or inflammatory focus appreciable by ultrasound. Significance of this finding is uncertain. Appropriate laboratory values to assess for pancreatitis advised. This finding may warrant nonemergent pre and postcontrast pancreatic MR to further evaluate.  Small left renal cyst.  Study otherwise unremarkable.   Electronically Signed   By: Lowella Grip III M.D.   On: 08/20/2014 22:13    CBC  Recent Labs Lab 08/20/14 1735 08/21/14 0823  WBC 14.1* 9.7  HGB 12.5 12.5  HCT 38.0 37.6  PLT 216 261  MCV 91.6 90.8  MCH 30.1 30.2  MCHC  32.9 33.2  RDW 15.6* 15.5  LYMPHSABS 2.0 2.2  MONOABS 0.8 0.5  EOSABS 0.0 0.2  BASOSABS 0.0 0.0    Chemistries   Recent Labs Lab 08/20/14 1735 08/21/14 0823  NA 139 139  K 4.2 3.8  CL 111 107  CO2 20* 24  GLUCOSE 117* 125*  BUN 22* 20  CREATININE 1.27* 1.13*  CALCIUM 8.7* 8.2*  AST 17 16  ALT 14 13*  ALKPHOS 73 67  BILITOT 0.3 0.4   ------------------------------------------------------------------------------------------------------------------ estimated creatinine clearance is 55.7 mL/min (by C-G formula based on Cr of 1.13). ------------------------------------------------------------------------------------------------------------------ No results for input(s): HGBA1C in the last 72 hours. ------------------------------------------------------------------------------------------------------------------ No results for input(s): CHOL, HDL, LDLCALC, TRIG, CHOLHDL, LDLDIRECT in the last 72 hours. ------------------------------------------------------------------------------------------------------------------ No results for input(s): TSH, T4TOTAL, T3FREE, THYROIDAB in the last 72 hours.  Invalid input(s): FREET3 ------------------------------------------------------------------------------------------------------------------ No results for input(s): VITAMINB12, FOLATE, FERRITIN, TIBC, IRON, RETICCTPCT in the last 72 hours.  Coagulation profile No results for input(s): INR, PROTIME in the last 168 hours.   Recent Labs  08/20/14 2028  DDIMER <0.27    Cardiac Enzymes  Recent Labs Lab 08/20/14 2028 08/21/14 0235 08/21/14 0823  TROPONINI <0.03 <0.03 <0.03   ------------------------------------------------------------------------------------------------------------------ Invalid input(s): POCBNP  No results for input(s): GLUCAP in the last 72 hours.   Burrell Hodapp M.D. [facility] 08/21/2014, 10:51 AM  Pager: [phone] Between 7am to 7pm - call  Pager - [phone]  After 7pm go to [email or web address] - password [email or web address]  Call night coverage person covering after 7pm

## 2014-08-21 NOTE — Plan of Care (Signed)
Problem: Consults Goal: Chest Pain Patient Education (See Patient Education module for education specifics.)  Outcome: Progressing Pt has been chest pain free on 1st shift. She has been on bedrest with assistance to use bed side commode. Vital signs have been stable. Oxygen saturation on room air has been in the upper 90s. Continue to monitor for chest pain.

## 2014-08-22 DIAGNOSIS — R0782 Intercostal pain: Secondary | ICD-10-CM | POA: Diagnosis not present

## 2014-08-22 DIAGNOSIS — N183 Chronic kidney disease, stage 3 (moderate): Secondary | ICD-10-CM | POA: Diagnosis not present

## 2014-08-22 DIAGNOSIS — R079 Chest pain, unspecified: Secondary | ICD-10-CM | POA: Diagnosis not present

## 2014-08-22 DIAGNOSIS — I10 Essential (primary) hypertension: Secondary | ICD-10-CM | POA: Diagnosis not present

## 2014-08-22 DIAGNOSIS — F1410 Cocaine abuse, uncomplicated: Secondary | ICD-10-CM | POA: Diagnosis not present

## 2014-08-22 MED ORDER — ATORVASTATIN CALCIUM 40 MG PO TABS: 40.0000 mg | ORAL_TABLET | Freq: Every day | ORAL | Status: DC

## 2014-08-22 MED ORDER — CEPHALEXIN 500 MG PO CAPS
500.0000 mg | ORAL_CAPSULE | Freq: Two times a day (BID) | ORAL | Status: DC
  Administered 2014-08-22: 500 mg via ORAL
  Filled 2014-08-22 (×2): qty 1

## 2014-08-22 MED ORDER — NICOTINE 14 MG/24HR TD PT24: 14.0000 mg | MEDICATED_PATCH | Freq: Every day | TRANSDERMAL | Status: DC

## 2014-08-22 MED ORDER — CEPHALEXIN 500 MG PO CAPS: 500.0000 mg | ORAL_CAPSULE | Freq: Two times a day (BID) | ORAL | Status: DC

## 2014-08-22 MED ORDER — ONDANSETRON HCL 4 MG PO TABS: 4.0000 mg | ORAL_TABLET | Freq: Three times a day (TID) | ORAL | Status: DC | PRN

## 2014-08-22 MED ORDER — AMLODIPINE BESYLATE 5 MG PO TABS
5.0000 mg | ORAL_TABLET | Freq: Every day | ORAL | Status: DC
Start: 2014-08-22 — End: 2014-09-11

## 2014-08-22 MED ORDER — NITROGLYCERIN 0.4 MG/HR TD PT24: 0.4000 mg | MEDICATED_PATCH | TRANSDERMAL | Status: DC

## 2014-08-22 NOTE — Progress Notes (Signed)
Patient Name: [name] Date of Encounter: 08/22/2014   SUBJECTIVE  Chest discomfort improved on nitro patch. No sob or palpitation.   CURRENT MEDS . amitriptyline  25 mg Oral QHS  . amLODipine  5 mg Oral Daily  . aspirin  324 mg Oral Once  . aspirin  81 mg Oral Daily  . atorvastatin  40 mg Oral q1800  . budesonide (PULMICORT) nebulizer solution  0.5 mg Nebulization BID  . cephALEXin  500 mg Oral Q12H  . lisinopril  20 mg Oral Daily  . loratadine  10 mg Oral Daily  . nicotine  14 mg Transdermal Daily  . nitroGLYCERIN  0.4 mg Transdermal Q24H  . oxybutynin  5 mg Oral BID  . pantoprazole  40 mg Oral Daily  . predniSONE  10 mg Oral Q breakfast    OBJECTIVE  Filed Vitals:   08/21/14 1255 08/21/14 1346 08/21/14 2152 08/22/14 0508  BP: 122/70 120/70 144/83 120/64  Pulse:  83 72 80  Temp:  98.8 F (37.1 C) 98.4 F (36.9 C) 98.1 F (36.7 C)  TempSrc:  Oral Oral Oral  Resp:  18 18 18   Height:      Weight:      SpO2:  98% 97% 97%    Intake/Output Summary (Last 24 hours) at 08/22/14 1044 Last data filed at 08/22/14 0800  Gross per 24 hour  Intake   1200 ml  Output   1550 ml  Net   -350 ml   Filed Weights   08/21/14 0212  Weight: 171 lb (77.565 kg)    PHYSICAL EXAM  General: Pleasant, NAD. Neuro: Alert and oriented X 3. Moves all extremities spontaneously. Psych: Normal affect. HEENT:  Normal  Neck: Supple without bruits or JVD. Lungs:  Resp regular and unlabored, CTA. Heart: RRR no s3, s4, or murmurs. Abdomen: Soft, non-tender, non-distended, BS + x 4.  Extremities: No clubbing, cyanosis or edema. DP/PT/Radials 2+ and equal bilaterally.  Accessory Clinical Findings  CBC  Recent Labs  08/20/14 1735 08/21/14 0823  WBC 14.1* 9.7  NEUTROABS 11.3* 6.9  HGB 12.5 12.5  HCT 38.0 37.6  MCV 91.6 90.8  PLT 216 250   Basic Metabolic Panel  Recent Labs  08/20/14 1735 08/21/14 0823  NA 139 139  K 4.2 3.8  CL 111 107  CO2 20* 24  GLUCOSE 117*  125*  BUN 22* 20  CREATININE 1.27* 1.13*  CALCIUM 8.7* 8.2*   Liver Function Tests  Recent Labs  08/20/14 1735 08/21/14 0823  AST 17 16  ALT 14 13*  ALKPHOS 73 67  BILITOT 0.3 0.4  PROT 5.9* 5.5*  ALBUMIN 3.0* 2.8*    Recent Labs  08/21/14 1352  LIPASE 20*   Cardiac Enzymes  Recent Labs  08/21/14 0235 08/21/14 0823 08/21/14 1352  TROPONINI <0.03 <0.03 <0.03   D-Dimer  Recent Labs  08/20/14 2028  DDIMER <0.27   Fasting Lipid Panel  Recent Labs  08/21/14 0943  CHOL 203*  HDL 75  LDLCALC 113*  TRIG 73  CHOLHDL 2.7   Thyroid Function Tests No results for input(s): TSH, T4TOTAL, T3FREE, THYROIDAB in the last 72 hours.  Invalid input(s): FREET3  TELE  NSR  Radiology/Studies  Dg Chest 2 View  08/20/2014   CLINICAL DATA:  Acute chest pain.  EXAM: CHEST  2 VIEW  COMPARISON:  February 28, 2014.  FINDINGS: The heart size and mediastinal contours are within normal limits. No pneumothorax or pleural effusion is noted. Right  lung is clear. Minimal left basilar subsegmental atelectasis is noted. The visualized skeletal structures are unremarkable.  IMPRESSION: Minimal left basilar subsegmental atelectasis.   Electronically Signed   By: Marijo Conception, M.D.   On: 08/20/2014 18:04   US Abdomen Complete  08/20/2014   CLINICAL DATA:  Chest and upper abdominal pain  EXAM: ULTRASOUND ABDOMEN COMPLETE  COMPARISON:  CT abdomen and pelvis November 18, 2013  FINDINGS: Gallbladder: No gallstones or wall thickening visualized. There is no pericholecystic fluid. No sonographic Murphy sign noted.  Common bile duct: Diameter: 3 mm. There is no intrahepatic, common hepatic, or common bile duct dilatation.  Liver: No focal lesion identified. Within normal limits in parenchymal echogenicity.  IVC: No abnormality visualized.  Pancreas: No mass or inflammatory focus. Pancreatic duct is prominent measuring 5 mm. No obstructing lesion is seen by ultrasound in the region of the pancreatic  duct.  Spleen: Size and appearance within normal limits.  Right Kidney: Length: 10.1 cm. Echogenicity within normal limits. No mass or hydronephrosis visualized.  Left Kidney: Length: 10.7 cm. Echogenicity within normal limits. No hydronephrosis visualized. There is a cyst in the mid left kidney measuring 1.8 x 1.2 x 1.8 cm  Abdominal aorta: No aneurysm visualized.  Other findings: No demonstrable ascites.  IMPRESSION: Prominence of the pancreatic duct without mass or inflammatory focus appreciable by ultrasound. Significance of this finding is uncertain. Appropriate laboratory values to assess for pancreatitis advised. This finding may warrant nonemergent pre and postcontrast pancreatic MR to further evaluate.  Small left renal cyst.  Study otherwise unremarkable.   Electronically Signed   By: Lowella Grip III M.D.   On: 08/20/2014 22:13   Echo 08/21/14 LV EF: 55% -  60%  ------------------------------------------------------------------- Indications:   Chest pain 786.51.  ------------------------------------------------------------------- History:  Risk factors: Current tobacco use. Hypertension.  ------------------------------------------------------------------- Study Conclusions  - Left ventricle: The cavity size was normal. Systolic function was normal. The estimated ejection fraction was in the range of 55% to 60%. Wall motion was normal; there were no regional wall motion abnormalities. Doppler parameters are consistent with abnormal left ventricular relaxation (grade 1 diastolic dysfunction).  ASSESSMENT AND PLAN  1. Coronary artery disease, cocaine-induced vasospasm, angina:  - Top x 3 negative, d-dimer negative. UDS positive for cocaine.  - Ms. Mcclarty continues to have angina both at rest and with exertion. She reports that these symptoms occur when she is not using illicit drugs, but are exacerbated by the cocaine and tobacco.  - Patient had a recent  [place] normal and cardiac cath on 7/16 revealed 80% ostial stenosis and small diagonal branch not amenable to PCI.  - Not a candidate for beta blocker therapy given ongoing cocaine abuse.Contiue ASA, atorvastatin and lisinopril. Added amlodipine yesterday.  - echo showed LV EF of 55-60, grade 1 DD, no WM abnormality - Chest discomfort improved on nitroglycerin patch, Consider switching to Imdur  - Discussed the importance of abstinence from both cocaine and tobacco   2. Tobacco/cocaine abuse: - UDS positive for cocaine.  - Discussed the importance of abstinence from both cocaine and tobacco. She said that she is ready to quit and has enrolled on rehab program.   3. Nausea/vomiting - Lipase of 20 - Abdominal US - Prominence of the pancreatic duct without mass or inflammatory focus appreciable by ultrasound. Significance of this finding is uncertain. Appropriate laboratory values to assess for pancreatitis advised. This finding may warrant nonemergent pre and postcontrast pancreatic MR to further evaluate. - further  management per primary.   4. UTI - per primary   Signed, Bhagat,Bhavinkumar PA-C Pager [phone]  Agree with above assessment. Okay for discharge today. NTG transdermal patch has helped her a great deal.

## 2014-08-22 NOTE — Discharge Summary (Signed)
Physician Discharge Summary   Patient ID: [name] MRN: [id number] DOB/AGE: 1955/07/28 59 y.o.  Admit date: 08/20/2014 Discharge date: 08/22/2014  Primary Care Physician:  Philis Fendt, MD  Discharge Diagnoses:    . atypical Chest pain . Nausea & vomiting . HTN (hypertension), benign . Rheumatoid arthritis . Cocaine abuse . CKD (chronic kidney disease) stage 3, GFR 30-59 ml/min . UTI (lower urinary tract infection)  Consults:  Cardiology   Recommendations for Outpatient Follow-up:  Patient was recommended to quit smoking and cocaine.  Patient was started on Norvasc 5 mg daily  TESTS THAT NEED FOLLOW-UP  please check a BMET   DIET: *Heart healthy diet    Allergies:   Allergies  Allergen Reactions  . Iohexol Anaphylaxis, Swelling and Other (See Comments)      Throat swelling    . Penicillins Itching  . Pork-Derived Products Nausea And Vomiting and Other (See Comments)    Reaction to pigs feet and knuckles      Discharge Medications:   Medication List    STOP taking these medications        promethazine 25 MG suppository  Commonly known as:  PHENERGAN      TAKE these medications        albuterol 108 (90 BASE) MCG/ACT inhaler  Commonly known as:  PROVENTIL HFA;VENTOLIN HFA  Inhale 2 puffs into the lungs every 6 (six) hours as needed for wheezing or shortness of breath.     amitriptyline 25 MG tablet  Commonly known as:  ELAVIL  Take 25 mg by mouth at bedtime.     amLODipine 5 MG tablet  Commonly known as:  NORVASC  Take 1 tablet (5 mg total) by mouth daily.     aspirin EC 81 MG tablet  Take 81 mg by mouth daily.     atorvastatin 40 MG tablet  Commonly known as:  LIPITOR  Take 1 tablet (40 mg total) by mouth daily at 6 PM.     beclomethasone 80 MCG/ACT inhaler  Commonly known as:  QVAR  Inhale 2 puffs into the lungs 2 (two) times daily.     cephALEXin 500 MG capsule  Commonly known as:  KEFLEX  Take 1 capsule (500 mg total) by  mouth 2 (two) times daily. X 5 days     docusate sodium 100 MG capsule  Commonly known as:  COLACE  Take 1 capsule (100 mg total) by mouth every 12 (twelve) hours.     ibuprofen 200 MG tablet  Commonly known as:  ADVIL,MOTRIN  Take 800 mg by mouth every 6 (six) hours as needed for moderate pain.     lisinopril 20 MG tablet  Commonly known as:  PRINIVIL,ZESTRIL  Take 20 mg by mouth daily.     loratadine 10 MG tablet  Commonly known as:  CLARITIN  Take 10 mg by mouth daily.     naproxen sodium 220 MG tablet  Commonly known as:  ANAPROX  Take 440 mg by mouth 3 (three) times daily as needed (pain). ALEVE     nicotine 14 mg/24hr patch  Commonly known as:  NICODERM CQ - dosed in mg/24 hours  Place 1 patch (14 mg total) onto the skin daily.     nitroGLYCERIN 0.4 mg/hr patch  Commonly known as:  NITRODUR - Dosed in mg/24 hr  Place 1 patch (0.4 mg total) onto the skin daily.     ondansetron 4 MG tablet  Commonly known as:  ZOFRAN  Take 1  tablet (4 mg total) by mouth every 8 (eight) hours as needed for nausea or vomiting.     OVER THE COUNTER MEDICATION  Apply 1 application topically daily as needed (rash on buttocks from pads). Sensi Care Protective Barrier (15% zinc oxide, 49% petrolatum)     oxybutynin 5 MG tablet  Commonly known as:  DITROPAN  Take 5 mg by mouth 2 (two) times daily.     oxyCODONE-acetaminophen 5-325 MG per tablet  Commonly known as:  PERCOCET/ROXICET  Take 1 tablet by mouth every 4 (four) hours as needed for severe pain.     pantoprazole 40 MG tablet  Commonly known as:  PROTONIX  Take 40 mg by mouth daily.     polyethylene glycol powder powder  Commonly known as:  MIRALAX  Take one capful by mouth 2 times daily until stools are loose     predniSONE 10 MG tablet  Commonly known as:  DELTASONE  Take 10 mg by mouth daily. Continuous course         Brief H and P: For complete details please refer to admission H and P, but in brief [name]  is a 59 y.o. female with history of hypertension, chronic kidney disease, rheumatoid arthritis and cocaine abuse presents to the ER because of chest pain. Patient states she has been having chest pain over the last 2 days which was retrosternal pressure-like present even at rest last for a few minutes each time. In addition patient also has been having persistent nausea and vomiting multiple times over the last couple of days. Patient also was noticing some epigastric and right upper quadrant pain. Denies any diarrhea. Patient was admitted last month at [facility] for chest pain and had cardiac cath on 07/27/2014 which revealed 80% ostial stenosis and a small diagonal branch which was not amicable to PCI. Patient was placed on antiplatelets agents and Lipitor and medically managed. Patient stated she has taken cocaine 4-5 days ago. Patient was admitted for further management.    Hospital Course:   Chest pain with underlying history of CAD, cocaine use, hypertension Serial troponins negative. Cardiology was consulted. Patient had a recent Lexiscan normal and cardiac cath on 7/16 revealed 80% ostial stenosis and small diagonal branch not amenable to PCI. Cardiology recommended to continue aspirin. No beta blockers currently to cocaine use. Patient was strongly counseled on smoking and cocaine cessation. A 2-D echocardiogram was obtained. Echo showed EF of 55-60%, normal wall motion, no regional wall motion abnormalities, grade 1 diastolic dysfunction.   Rheumatoid arthritis - Currently stable, given one dose of stress dose to records, no further nausea and vomiting   HTN (hypertension), benign - Currently stable continue home medications, lisinopril, added Norvasc   Cocaine abuse - Patient strongly counseled on cocaine cessation   Nausea & vomiting resolved - Currently improving, abdominal ultrasound showed prominence of pancreatic duct without any mass or inflammatory focus. Lipase was  only 20. Patient is tolerating solid diet without any difficulty.    CKD (chronic kidney disease) stage 3, GFR 30-59 ml/min - Creatinine at baseline   UTI (lower urinary tract infection) - Urine culture showed gram-negative rods, patient has recurrent history of Escherichia coli UTIs. Patient received IV Rocephin while in patient. Placed on Keflex.  Day of Discharge BP 120/64 mmHg  Pulse 80  Temp(Src) 98.1 F (36.7 C) (Oral)  Resp 18  Ht 5\' 6"  (1.676 m)  Wt 77.565 kg (171 lb)  BMI 27.61 kg/m2  SpO2 97%  Physical Exam: General: Alert and awake oriented x3 not in any acute distress. HEENT: anicteric sclera, pupils reactive to light and accommodation CVS: S1-S2 clear no murmur rubs or gallops Chest: clear to auscultation bilaterally, no wheezing rales or rhonchi Abdomen: soft nontender, nondistended, normal bowel sounds Extremities: no cyanosis, clubbing or edema noted bilaterally Neuro: Cranial nerves II-XII intact, no focal neurological deficits   The results of significant diagnostics from this hospitalization (including imaging, microbiology, ancillary and laboratory) are listed below for reference.    LAB RESULTS: Basic Metabolic Panel:  Recent Labs Lab 08/20/14 1735 08/21/14 0823  NA 139 139  K 4.2 3.8  CL 111 107  CO2 20* 24  GLUCOSE 117* 125*  BUN 22* 20  CREATININE 1.27* 1.13*  CALCIUM 8.7* 8.2*   Liver Function Tests:  Recent Labs Lab 08/20/14 1735 08/21/14 0823  AST 17 16  ALT 14 13*  ALKPHOS 73 67  BILITOT 0.3 0.4  PROT 5.9* 5.5*  ALBUMIN 3.0* 2.8*    Recent Labs Lab 08/21/14 1352  LIPASE 20*   No results for input(s): AMMONIA in the last 168 hours. CBC:  Recent Labs Lab 08/20/14 1735 08/21/14 0823  WBC 14.1* 9.7  NEUTROABS 11.3* 6.9  HGB 12.5 12.5  HCT 38.0 37.6  MCV 91.6 90.8  PLT 216 261   Cardiac Enzymes:  Recent Labs Lab 08/21/14 0823 08/21/14 1352  TROPONINI <0.03 <0.03   BNP: Invalid input(s): POCBNP CBG: No  results for input(s): GLUCAP in the last 168 hours.  Significant Diagnostic Studies:  Dg Chest 2 View  08/20/2014   CLINICAL DATA:  Acute chest pain.  EXAM: CHEST  2 VIEW  COMPARISON:  February 28, 2014.  FINDINGS: The heart size and mediastinal contours are within normal limits. No pneumothorax or pleural effusion is noted. Right lung is clear. Minimal left basilar subsegmental atelectasis is noted. The visualized skeletal structures are unremarkable.  IMPRESSION: Minimal left basilar subsegmental atelectasis.   Electronically Signed   By: Marijo Conception, M.D.   On: 08/20/2014 18:04   US Abdomen Complete  08/20/2014   CLINICAL DATA:  Chest and upper abdominal pain  EXAM: ULTRASOUND ABDOMEN COMPLETE  COMPARISON:  CT abdomen and pelvis November 18, 2013  FINDINGS: Gallbladder: No gallstones or wall thickening visualized. There is no pericholecystic fluid. No sonographic Murphy sign noted.  Common bile duct: Diameter: 3 mm. There is no intrahepatic, common hepatic, or common bile duct dilatation.  Liver: No focal lesion identified. Within normal limits in parenchymal echogenicity.  IVC: No abnormality visualized.  Pancreas: No mass or inflammatory focus. Pancreatic duct is prominent measuring 5 mm. No obstructing lesion is seen by ultrasound in the region of the pancreatic duct.  Spleen: Size and appearance within normal limits.  Right Kidney: Length: 10.1 cm. Echogenicity within normal limits. No mass or hydronephrosis visualized.  Left Kidney: Length: 10.7 cm. Echogenicity within normal limits. No hydronephrosis visualized. There is a cyst in the mid left kidney measuring 1.8 x 1.2 x 1.8 cm  Abdominal aorta: No aneurysm visualized.  Other findings: No demonstrable ascites.  IMPRESSION: Prominence of the pancreatic duct without mass or inflammatory focus appreciable by ultrasound. Significance of this finding is uncertain. Appropriate laboratory values to assess for pancreatitis advised. This finding may warrant  nonemergent pre and postcontrast pancreatic MR to further evaluate.  Small left renal cyst.  Study otherwise unremarkable.   Electronically Signed   By: Lowella Grip III M.D.   On: 08/20/2014 22:13  2D ECHO: Study Conclusions  - Left ventricle: The cavity size was normal. Systolic function was normal. The estimated ejection fraction was in the range of 55% to 60%. Wall motion was normal; there were no regional wall motion abnormalities. Doppler parameters are consistent with abnormal left ventricular relaxation (grade 1 diastolic dysfunction).  Disposition and Follow-up: Discharge Instructions    Diet - low sodium heart healthy    Complete by:  As directed      Increase activity slowly    Complete by:  As directed             DISPOSITION: Home   DISCHARGE FOLLOW-UP Follow-up Information    Follow up with Philis Fendt, MD. Go on 09/04/2014.   Specialty:  Internal Medicine   Why:  4:15pm 8/24 follow up appointment   Contact information:   [address] [place] [address] [phone]        Time spent on Discharge: 35 mins   Signed:   Yasmine Kilbourne M.D. [facility] 08/22/2014, 12:19 PM Pager: [phone]

## 2014-08-22 NOTE — Progress Notes (Signed)
CSW contacted for pt return to rehab facility/ transport.  Pt staying at [facility] through a rehab program- CSW called and confirmed.  CSW gave RN taxi voucher.  CSW signing off  Domenica Reamer, [place] Social Worker [phone]

## 2014-08-23 LAB — URINE CULTURE

## 2014-08-29 IMAGING — MG MM DIAGNOSTIC BILATERAL
8 of 13 series · 8 of 13 positions shown · non-contrast
Comparison: 01/24/2013

CLINICAL DATA: Abnormal screening mammogram. The patient has a
history of rheumatoid arthritis with recent hospitalization for the
disease.

EXAM:
DIGITAL DIAGNOSTIC  BILATERAL MAMMOGRAM WITH CAD

[R CC]
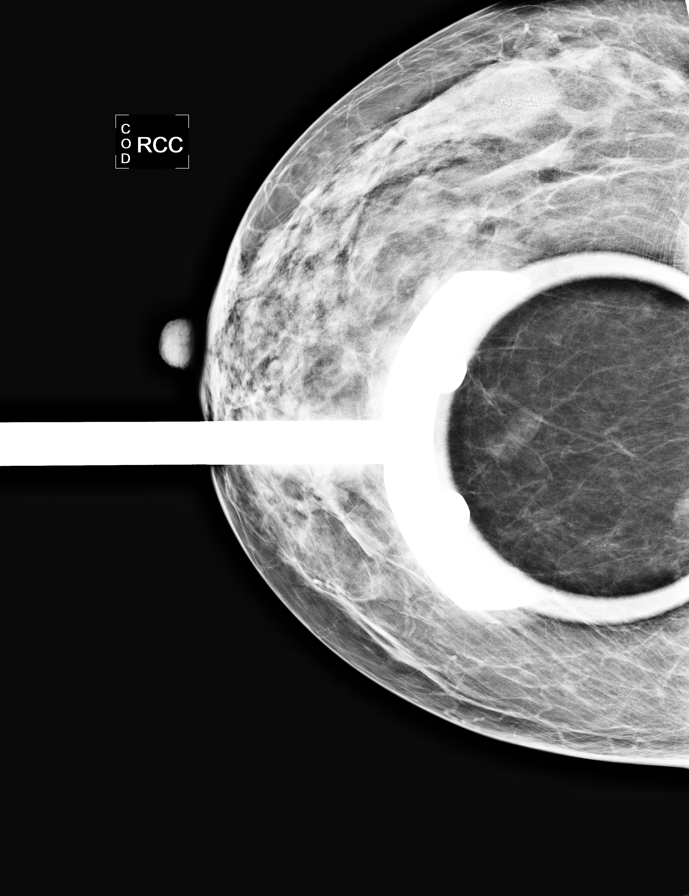

[R MLO (1 of 4)]
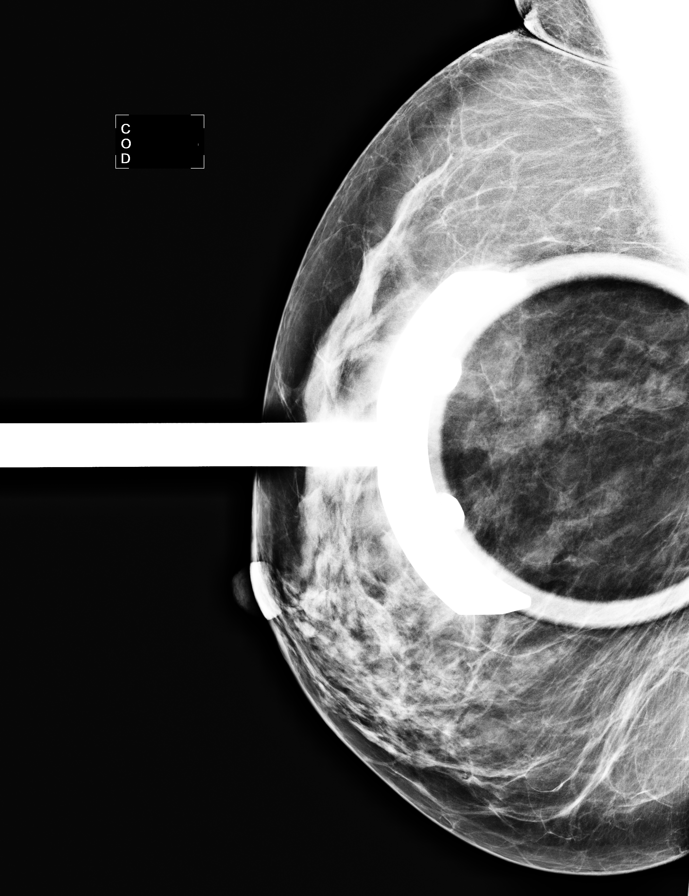

[R MLO (2 of 4)]
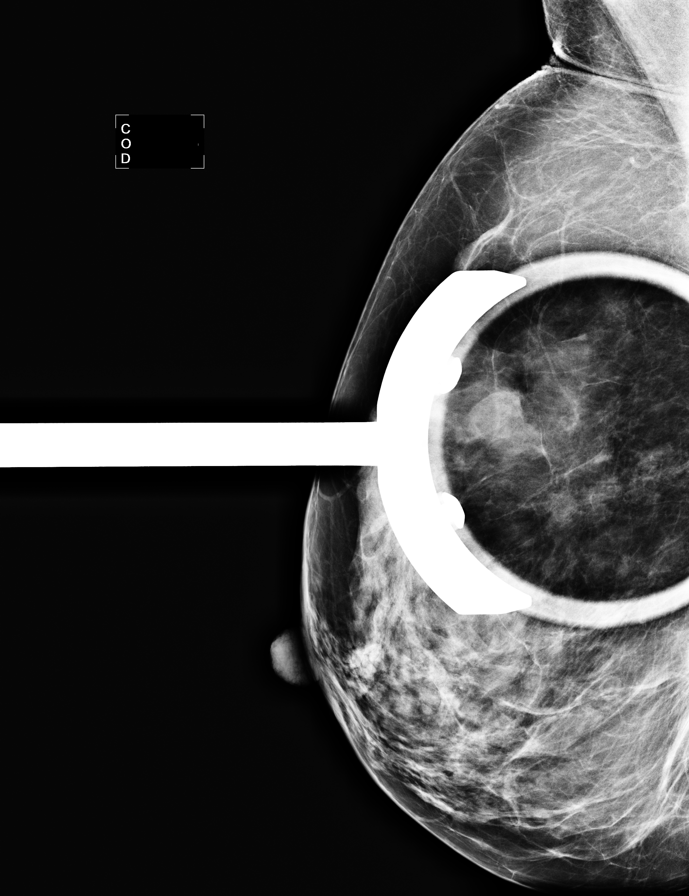

[R MLO (3 of 4)]
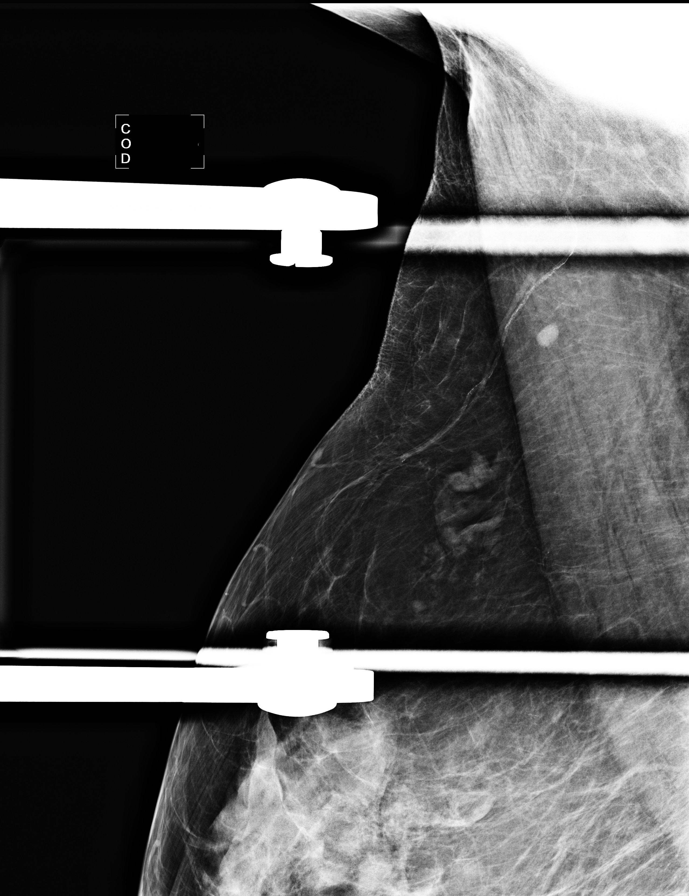

[R MLO (4 of 4)]
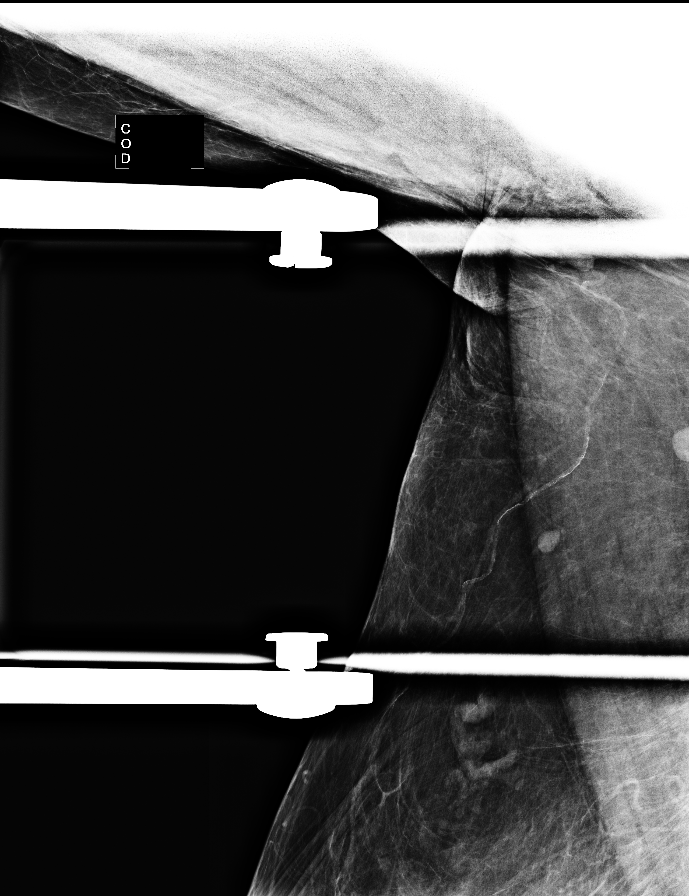

[L CC]
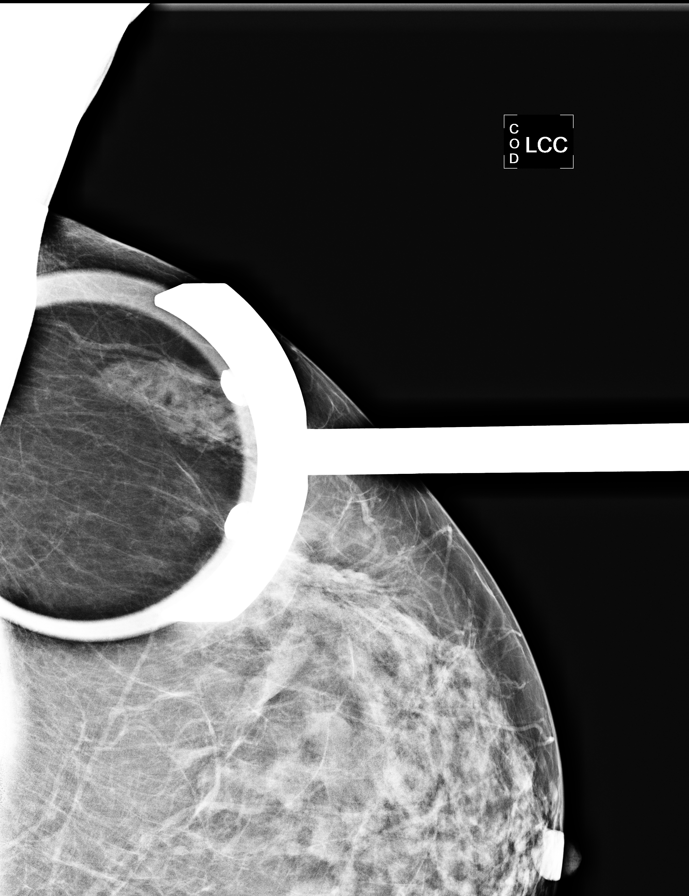

[L MLO (1 of 2)]
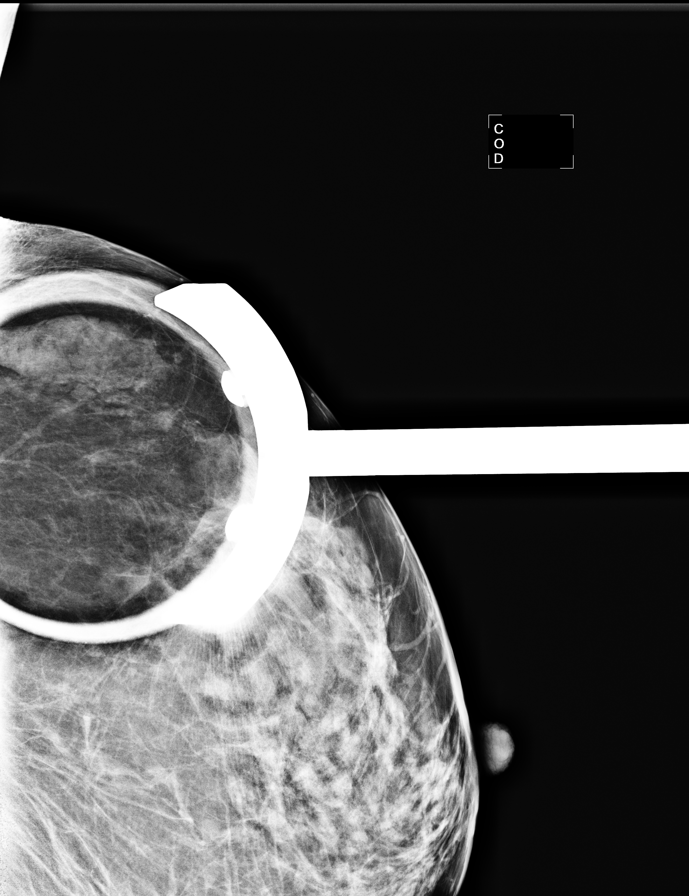

[L MLO (2 of 2)]
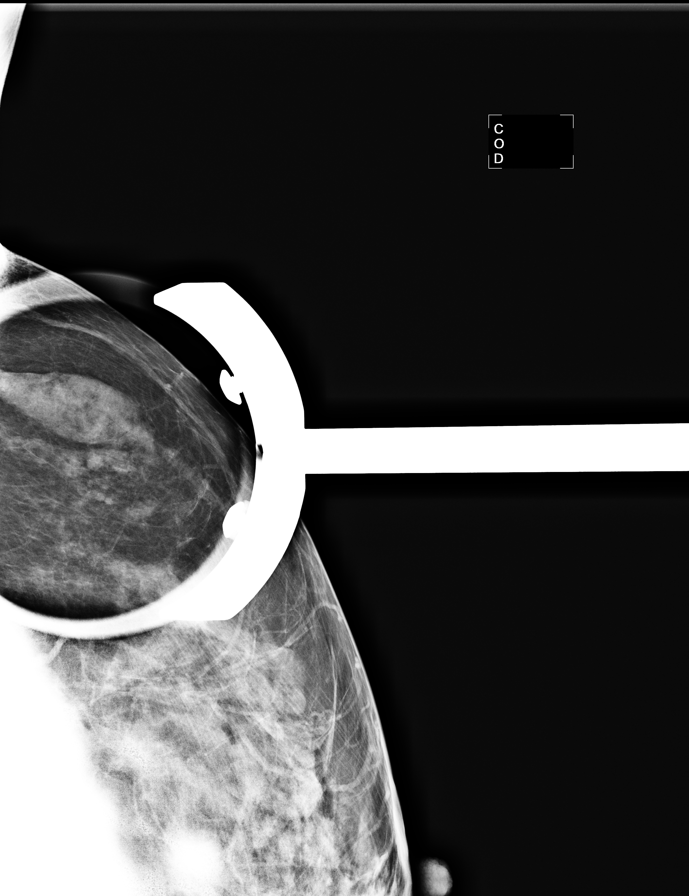

[8 of 13 positions shown; findings below may reference images not displayed]

ACR Breast Density Category c: The breast tissue is heterogeneously
dense, which may obscure small masses.
FINDINGS: In the upper-outer left breast, there are 4 rounded calcifications
spanning up to 5 mm. No associated mass or architectural distortion.
No linear or branching forms. In the upper-outer left breast there
is a 4.3 cm focal asymmetry with interspersed fat. In the area of
the questioned mass in the slightly inner upper right breast, a
cm isodense mass persists, best seen on the spot compression CC
view. Asymmetry is seen in the far upper posterior right breast
measuring up to 2.3 cm.

Mammographic images were processed with CAD.
IMPRESSION: 1. Probably benign left breast calcifications.
2. Bilateral asymmetries, prominent right axillary lymph nodes, and
a right breast mass seen mammographically. Further evaluation with
ultrasound is needed.

RECOMMENDATION:
A 6 month follow-up left diagnostic mammogram is recommended for
left breast calcifications. Ultrasound is needed for further
evaluation of mammographic findings.

I have discussed the findings and recommendations with the patient.
Results were also provided in writing at the conclusion of the
visit. If applicable, a reminder letter will be sent to the patient
regarding the next appointment.

BI-RADS CATEGORY  0: Incomplete. Need additional imaging evaluation
and/or prior mammograms for comparison.

## 2014-09-09 ENCOUNTER — Encounter (HOSPITAL_COMMUNITY): Payer: Self-pay | Admitting: *Deleted

## 2014-09-09 ENCOUNTER — Observation Stay (HOSPITAL_COMMUNITY): Payer: Medicaid Other

## 2014-09-09 ENCOUNTER — Emergency Department (HOSPITAL_COMMUNITY): Payer: Medicaid Other

## 2014-09-09 ENCOUNTER — Observation Stay (HOSPITAL_COMMUNITY)
Admission: EM | Admit: 2014-09-09 | Discharge: 2014-09-11 | Disposition: A | Discharge status: Home / Self Care | Payer: Medicaid Other | Attending: Internal Medicine | Admitting: Internal Medicine

## 2014-09-09 DIAGNOSIS — J410 Simple chronic bronchitis: Secondary | ICD-10-CM

## 2014-09-09 DIAGNOSIS — R0602 Shortness of breath: Secondary | ICD-10-CM | POA: Insufficient documentation

## 2014-09-09 DIAGNOSIS — Z720 Tobacco use: Secondary | ICD-10-CM | POA: Diagnosis not present

## 2014-09-09 DIAGNOSIS — R079 Chest pain, unspecified: Principal | ICD-10-CM | POA: Diagnosis present

## 2014-09-09 DIAGNOSIS — F1490 Cocaine use, unspecified, uncomplicated: Secondary | ICD-10-CM | POA: Diagnosis not present

## 2014-09-09 DIAGNOSIS — Z7982 Long term (current) use of aspirin: Secondary | ICD-10-CM | POA: Diagnosis not present

## 2014-09-09 DIAGNOSIS — R51 Headache: Secondary | ICD-10-CM | POA: Diagnosis not present

## 2014-09-09 DIAGNOSIS — F1410 Cocaine abuse, uncomplicated: Secondary | ICD-10-CM | POA: Diagnosis not present

## 2014-09-09 DIAGNOSIS — I959 Hypotension, unspecified: Secondary | ICD-10-CM

## 2014-09-09 DIAGNOSIS — E860 Dehydration: Secondary | ICD-10-CM

## 2014-09-09 DIAGNOSIS — M069 Rheumatoid arthritis, unspecified: Secondary | ICD-10-CM | POA: Diagnosis present

## 2014-09-09 DIAGNOSIS — K219 Gastro-esophageal reflux disease without esophagitis: Secondary | ICD-10-CM | POA: Diagnosis present

## 2014-09-09 DIAGNOSIS — J438 Other emphysema: Secondary | ICD-10-CM

## 2014-09-09 DIAGNOSIS — Z87828 Personal history of other (healed) physical injury and trauma: Secondary | ICD-10-CM | POA: Diagnosis not present

## 2014-09-09 DIAGNOSIS — I4581 Long QT syndrome: Secondary | ICD-10-CM

## 2014-09-09 DIAGNOSIS — Z880 Allergy status to penicillin: Secondary | ICD-10-CM | POA: Insufficient documentation

## 2014-09-09 DIAGNOSIS — Z79899 Other long term (current) drug therapy: Secondary | ICD-10-CM | POA: Insufficient documentation

## 2014-09-09 DIAGNOSIS — R197 Diarrhea, unspecified: Secondary | ICD-10-CM | POA: Diagnosis not present

## 2014-09-09 DIAGNOSIS — N183 Chronic kidney disease, stage 3 unspecified: Secondary | ICD-10-CM | POA: Diagnosis present

## 2014-09-09 DIAGNOSIS — E785 Hyperlipidemia, unspecified: Secondary | ICD-10-CM

## 2014-09-09 DIAGNOSIS — Z7951 Long term (current) use of inhaled steroids: Secondary | ICD-10-CM | POA: Diagnosis not present

## 2014-09-09 DIAGNOSIS — R0981 Nasal congestion: Secondary | ICD-10-CM | POA: Diagnosis not present

## 2014-09-09 DIAGNOSIS — N179 Acute kidney failure, unspecified: Secondary | ICD-10-CM | POA: Diagnosis present

## 2014-09-09 DIAGNOSIS — I10 Essential (primary) hypertension: Secondary | ICD-10-CM | POA: Diagnosis not present

## 2014-09-09 DIAGNOSIS — J449 Chronic obstructive pulmonary disease, unspecified: Secondary | ICD-10-CM | POA: Diagnosis present

## 2014-09-09 DIAGNOSIS — R110 Nausea: Secondary | ICD-10-CM | POA: Diagnosis not present

## 2014-09-09 DIAGNOSIS — E782 Mixed hyperlipidemia: Secondary | ICD-10-CM | POA: Diagnosis present

## 2014-09-09 DIAGNOSIS — I951 Orthostatic hypotension: Secondary | ICD-10-CM

## 2014-09-09 LAB — COMPREHENSIVE METABOLIC PANEL
ALBUMIN: 3.6 g/dL (ref 3.5–5.0)
ALT: 15 U/L (ref 14–54)
AST: 15 U/L (ref 15–41)
Alkaline Phosphatase: 73 U/L (ref 38–126)
Anion gap: 8 (ref 5–15)
BUN: 27 mg/dL — ABNORMAL HIGH (ref 6–20)
CALCIUM: 9.4 mg/dL (ref 8.9–10.3)
CHLORIDE: 101 mmol/L (ref 101–111)
CO2: 26 mmol/L (ref 22–32)
Creatinine, Ser: 1.48 mg/dL — ABNORMAL HIGH (ref 0.44–1.00)
GFR calc non Af Amer: 37 mL/min — ABNORMAL LOW (ref >60)
GFR, EST AFRICAN AMERICAN: 43 mL/min — AB (ref >60)
GLUCOSE: 110 mg/dL — AB (ref 65–99)
POTASSIUM: 4.9 mmol/L (ref 3.5–5.1)
Sodium: 135 mmol/L (ref 135–145)
Total Bilirubin: 0.9 mg/dL (ref 0.3–1.2)
Total Protein: 7.2 g/dL (ref 6.5–8.1)

## 2014-09-09 LAB — CBC
HEMATOCRIT: 38.9 % (ref 36.0–46.0)
HEMOGLOBIN: 12.9 g/dL (ref 12.0–15.0)
MCH: 29.5 pg (ref 26.0–34.0)
MCHC: 33.2 g/dL (ref 30.0–36.0)
MCV: 88.8 fL (ref 78.0–100.0)
Platelets: 274 10*3/uL (ref 150–400)
RBC: 4.38 MIL/uL (ref 3.87–5.11)
RDW: 14.6 % (ref 11.5–15.5)
WBC: 8.7 10*3/uL (ref 4.0–10.5)

## 2014-09-09 LAB — CBC WITH DIFFERENTIAL/PLATELET
Basophils Absolute: 0 10*3/uL (ref 0.0–0.1)
Basophils Relative: 0 % (ref 0–1)
EOS PCT: 0 % (ref 0–5)
Eosinophils Absolute: 0 10*3/uL (ref 0.0–0.7)
HCT: 41.1 % (ref 36.0–46.0)
HEMOGLOBIN: 14 g/dL (ref 12.0–15.0)
LYMPHS ABS: 2.3 10*3/uL (ref 0.7–4.0)
Lymphocytes Relative: 23 % (ref 12–46)
MCH: 30.5 pg (ref 26.0–34.0)
MCHC: 34.1 g/dL (ref 30.0–36.0)
MCV: 89.5 fL (ref 78.0–100.0)
MONOS PCT: 4 % (ref 3–12)
Monocytes Absolute: 0.4 10*3/uL (ref 0.1–1.0)
Neutro Abs: 7.2 10*3/uL (ref 1.7–7.7)
Neutrophils Relative %: 73 % (ref 43–77)
PLATELETS: 291 10*3/uL (ref 150–400)
RBC: 4.59 MIL/uL (ref 3.87–5.11)
RDW: 14.6 % (ref 11.5–15.5)
WBC: 10 10*3/uL (ref 4.0–10.5)

## 2014-09-09 LAB — TROPONIN I: Troponin I: <0.03 ng/mL (ref <0.031)

## 2014-09-09 LAB — CREATININE, SERUM
Creatinine, Ser: 1.27 mg/dL — ABNORMAL HIGH (ref 0.44–1.00)
GFR calc Af Amer: 52 mL/min — ABNORMAL LOW (ref >60)
GFR calc non Af Amer: 45 mL/min — ABNORMAL LOW (ref >60)

## 2014-09-09 LAB — RAPID URINE DRUG SCREEN, HOSP PERFORMED
AMPHETAMINES: NOT DETECTED
Barbiturates: NOT DETECTED
Benzodiazepines: NOT DETECTED
COCAINE: POSITIVE — AB
OPIATES: NOT DETECTED
TETRAHYDROCANNABINOL: NOT DETECTED

## 2014-09-09 LAB — I-STAT TROPONIN, ED: Troponin i, poc: 0.01 ng/mL (ref 0.00–0.08)

## 2014-09-09 LAB — MAGNESIUM: Magnesium: 1.9 mg/dL (ref 1.7–2.4)

## 2014-09-09 LAB — GLUCOSE, CAPILLARY: GLUCOSE-CAPILLARY: 126 mg/dL — AB (ref 65–99)

## 2014-09-09 MED ORDER — ACETAMINOPHEN 325 MG PO TABS: 650.0000 mg | ORAL_TABLET | ORAL | Status: DC | PRN

## 2014-09-09 MED ORDER — ASPIRIN EC 81 MG PO TBEC
81.0000 mg | DELAYED_RELEASE_TABLET | Freq: Every day | ORAL | Status: DC
  Administered 2014-09-09 – 2014-09-11 (×3): 81 mg via ORAL
  Filled 2014-09-09 (×3): qty 1

## 2014-09-09 MED ORDER — AMLODIPINE BESYLATE 5 MG PO TABS
5.0000 mg | ORAL_TABLET | Freq: Every day | ORAL | Status: DC
  Administered 2014-09-09 – 2014-09-10 (×2): 5 mg via ORAL
  Filled 2014-09-09 (×2): qty 1

## 2014-09-09 MED ORDER — NITROGLYCERIN 0.4 MG SL SUBL: 0.4000 mg | SUBLINGUAL_TABLET | SUBLINGUAL | Status: DC | PRN

## 2014-09-09 MED ORDER — GUAIFENESIN ER 600 MG PO TB12
600.0000 mg | ORAL_TABLET | Freq: Two times a day (BID) | ORAL | Status: DC
  Administered 2014-09-09 – 2014-09-11 (×5): 600 mg via ORAL
  Filled 2014-09-09 (×5): qty 1

## 2014-09-09 MED ORDER — TECHNETIUM TC 99M SESTAMIBI GENERIC - CARDIOLITE
10.0000 | Freq: Once | INTRAVENOUS | Status: AC | PRN
Start: 2014-09-09 — End: 2014-09-09
  Administered 2014-09-09: 10 via INTRAVENOUS

## 2014-09-09 MED ORDER — DOCUSATE SODIUM 100 MG PO CAPS
100.0000 mg | ORAL_CAPSULE | Freq: Two times a day (BID) | ORAL | Status: DC
  Administered 2014-09-09 – 2014-09-11 (×5): 100 mg via ORAL
  Filled 2014-09-09 (×5): qty 1

## 2014-09-09 MED ORDER — PANTOPRAZOLE SODIUM 40 MG PO TBEC
40.0000 mg | DELAYED_RELEASE_TABLET | Freq: Two times a day (BID) | ORAL | Status: DC
  Administered 2014-09-09 – 2014-09-11 (×5): 40 mg via ORAL
  Filled 2014-09-09 (×5): qty 1

## 2014-09-09 MED ORDER — BECLOMETHASONE DIPROPIONATE 80 MCG/ACT IN AERS: 2.0000 | INHALATION_SPRAY | Freq: Two times a day (BID) | RESPIRATORY_TRACT | Status: DC

## 2014-09-09 MED ORDER — REGADENOSON 0.4 MG/5ML IV SOLN
INTRAVENOUS | Status: AC
  Administered 2014-09-09: 0.4 mg via INTRAVENOUS
  Filled 2014-09-09: qty 5

## 2014-09-09 MED ORDER — PREDNISONE 20 MG PO TABS
10.0000 mg | ORAL_TABLET | Freq: Every day | ORAL | Status: DC
  Administered 2014-09-09 – 2014-09-10 (×2): 10 mg via ORAL
  Filled 2014-09-09 (×2): qty 1

## 2014-09-09 MED ORDER — HEPARIN SODIUM (PORCINE) 5000 UNIT/ML IJ SOLN
5000.0000 [IU] | Freq: Three times a day (TID) | INTRAMUSCULAR | Status: DC
  Administered 2014-09-09 – 2014-09-11 (×5): 5000 [IU] via SUBCUTANEOUS
  Filled 2014-09-09 (×5): qty 1

## 2014-09-09 MED ORDER — SODIUM CHLORIDE 0.9 % IV SOLN
INTRAVENOUS | Status: DC
  Administered 2014-09-09: 11:00:00 via INTRAVENOUS

## 2014-09-09 MED ORDER — ALBUTEROL SULFATE (2.5 MG/3ML) 0.083% IN NEBU: 3.0000 mL | INHALATION_SOLUTION | Freq: Four times a day (QID) | RESPIRATORY_TRACT | Status: DC | PRN

## 2014-09-09 MED ORDER — BUDESONIDE 0.25 MG/2ML IN SUSP
0.2500 mg | Freq: Two times a day (BID) | RESPIRATORY_TRACT | Status: DC
  Administered 2014-09-09 – 2014-09-11 (×4): 0.25 mg via RESPIRATORY_TRACT
  Filled 2014-09-09 (×4): qty 2

## 2014-09-09 MED ORDER — AMITRIPTYLINE HCL 25 MG PO TABS
25.0000 mg | ORAL_TABLET | Freq: Every day | ORAL | Status: DC
  Administered 2014-09-09: 25 mg via ORAL
  Filled 2014-09-09: qty 1

## 2014-09-09 MED ORDER — LISINOPRIL 20 MG PO TABS
20.0000 mg | ORAL_TABLET | Freq: Every day | ORAL | Status: DC
  Administered 2014-09-09: 20 mg via ORAL
  Filled 2014-09-09: qty 1

## 2014-09-09 MED ORDER — NITROGLYCERIN 0.4 MG SL SUBL
0.4000 mg | SUBLINGUAL_TABLET | SUBLINGUAL | Status: DC | PRN
  Administered 2014-09-09: 0.4 mg via SUBLINGUAL
  Filled 2014-09-09: qty 1

## 2014-09-09 MED ORDER — GI COCKTAIL ~~LOC~~
30.0000 mL | Freq: Four times a day (QID) | ORAL | Status: DC | PRN
  Administered 2014-09-09 – 2014-09-10 (×3): 30 mL via ORAL
  Filled 2014-09-09 (×4): qty 30

## 2014-09-09 MED ORDER — OXYBUTYNIN CHLORIDE 5 MG PO TABS
5.0000 mg | ORAL_TABLET | Freq: Two times a day (BID) | ORAL | Status: DC
  Administered 2014-09-09 – 2014-09-11 (×5): 5 mg via ORAL
  Filled 2014-09-09 (×5): qty 1

## 2014-09-09 MED ORDER — MORPHINE SULFATE (PF) 2 MG/ML IV SOLN
2.0000 mg | INTRAVENOUS | Status: DC | PRN
  Administered 2014-09-09 – 2014-09-10 (×3): 2 mg via INTRAVENOUS
  Filled 2014-09-09 (×3): qty 1

## 2014-09-09 MED ORDER — ONDANSETRON HCL 4 MG/2ML IJ SOLN: 4.0000 mg | Freq: Four times a day (QID) | INTRAMUSCULAR | Status: DC | PRN

## 2014-09-09 MED ORDER — ATORVASTATIN CALCIUM 40 MG PO TABS
40.0000 mg | ORAL_TABLET | Freq: Every day | ORAL | Status: DC
  Administered 2014-09-09 – 2014-09-10 (×2): 40 mg via ORAL
  Filled 2014-09-09 (×2): qty 1

## 2014-09-09 MED ORDER — CARVEDILOL 3.125 MG PO TABS
3.1250 mg | ORAL_TABLET | Freq: Two times a day (BID) | ORAL | Status: DC
Start: 2014-09-09 — End: 2014-09-10
  Administered 2014-09-09: 3.125 mg via ORAL
  Filled 2014-09-09: qty 1

## 2014-09-09 MED ORDER — OXYCODONE-ACETAMINOPHEN 5-325 MG PO TABS
1.0000 | ORAL_TABLET | Freq: Three times a day (TID) | ORAL | Status: DC | PRN
  Administered 2014-09-10 – 2014-09-11 (×3): 1 via ORAL
  Filled 2014-09-09 (×3): qty 1

## 2014-09-09 MED ORDER — TECHNETIUM TC 99M SESTAMIBI - CARDIOLITE
30.0000 | Freq: Once | INTRAVENOUS | Status: AC | PRN
  Administered 2014-09-09: 14:00:00 30 via INTRAVENOUS

## 2014-09-09 MED ORDER — MUSCLE RUB 10-15 % EX CREA
TOPICAL_CREAM | CUTANEOUS | Status: DC | PRN
  Administered 2014-09-09: 18:00:00 via TOPICAL
  Filled 2014-09-09: qty 85

## 2014-09-09 MED ORDER — NICOTINE 14 MG/24HR TD PT24
14.0000 mg | MEDICATED_PATCH | Freq: Every day | TRANSDERMAL | Status: DC
  Administered 2014-09-09 – 2014-09-11 (×3): 14 mg via TRANSDERMAL
  Filled 2014-09-09 (×3): qty 1

## 2014-09-09 MED ORDER — REGADENOSON 0.4 MG/5ML IV SOLN
0.4000 mg | Freq: Once | INTRAVENOUS | Status: AC
Start: 2014-09-09 — End: 2014-09-09
  Administered 2014-09-09: 0.4 mg via INTRAVENOUS
  Filled 2014-09-09: qty 5

## 2014-09-09 NOTE — ED Notes (Signed)
Pt. Was treated 2 weeks ago for the same. Woke up at 0420 with a substernal tightness, no radiation. Pt. Was wearing a nitro patch that was removed by EMS. Pt. Received 324mg  of ASA and 1 sublingual nitro with EMS. Pt. States last cocaine use was at midnight.

## 2014-09-09 NOTE — ED Notes (Signed)
Attempt to call report to floor x1. 
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
## 2014-09-09 NOTE — Progress Notes (Signed)
     The patient was seen in nuclear medicine for a lexiscan myoview. She tolerated the procedure well. No acute ST or TW changes on ECG.    Perry Mount PA-C  [facility]
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
## 2014-09-09 NOTE — ED Notes (Signed)
Pt still has 8/10 CP after 1 nitro - BP now 93/60, will hold on giving an other SL nitro at this time.

## 2014-09-09 NOTE — Progress Notes (Signed)
Nuc was normal. Primary team informed. Patient informed via nurse. Per consult note, plan observe overnight and f/u EKG in AM to reassess long QT. Will also check Mg level. Unknown Flannigan PA-C

## 2014-09-10 DIAGNOSIS — R079 Chest pain, unspecified: Secondary | ICD-10-CM | POA: Diagnosis not present

## 2014-09-10 DIAGNOSIS — R110 Nausea: Secondary | ICD-10-CM | POA: Diagnosis not present

## 2014-09-10 DIAGNOSIS — I959 Hypotension, unspecified: Secondary | ICD-10-CM

## 2014-09-10 DIAGNOSIS — F1490 Cocaine use, unspecified, uncomplicated: Secondary | ICD-10-CM | POA: Diagnosis not present

## 2014-09-10 DIAGNOSIS — F1410 Cocaine abuse, uncomplicated: Secondary | ICD-10-CM | POA: Diagnosis not present

## 2014-09-10 DIAGNOSIS — E860 Dehydration: Secondary | ICD-10-CM

## 2014-09-10 DIAGNOSIS — J410 Simple chronic bronchitis: Secondary | ICD-10-CM | POA: Diagnosis not present

## 2014-09-10 DIAGNOSIS — I951 Orthostatic hypotension: Secondary | ICD-10-CM

## 2014-09-10 DIAGNOSIS — R51 Headache: Secondary | ICD-10-CM | POA: Diagnosis not present

## 2014-09-10 LAB — CBC
HEMATOCRIT: 37.4 % (ref 36.0–46.0)
HEMOGLOBIN: 12.4 g/dL (ref 12.0–15.0)
MCH: 30 pg (ref 26.0–34.0)
MCHC: 33.2 g/dL (ref 30.0–36.0)
MCV: 90.6 fL (ref 78.0–100.0)
Platelets: 261 10*3/uL (ref 150–400)
RBC: 4.13 MIL/uL (ref 3.87–5.11)
RDW: 14.6 % (ref 11.5–15.5)
WBC: 8.3 10*3/uL (ref 4.0–10.5)

## 2014-09-10 LAB — BASIC METABOLIC PANEL
ANION GAP: 8 (ref 5–15)
BUN: 38 mg/dL — ABNORMAL HIGH (ref 6–20)
CALCIUM: 8.5 mg/dL — AB (ref 8.9–10.3)
CHLORIDE: 101 mmol/L (ref 101–111)
CO2: 22 mmol/L (ref 22–32)
Creatinine, Ser: 1.67 mg/dL — ABNORMAL HIGH (ref 0.44–1.00)
GFR calc non Af Amer: 32 mL/min — ABNORMAL LOW (ref >60)
GFR, EST AFRICAN AMERICAN: 37 mL/min — AB (ref >60)
Glucose, Bld: 109 mg/dL — ABNORMAL HIGH (ref 65–99)
POTASSIUM: 4.2 mmol/L (ref 3.5–5.1)
Sodium: 131 mmol/L — ABNORMAL LOW (ref 135–145)

## 2014-09-10 MED ORDER — SODIUM CHLORIDE 0.9 % IV BOLUS (SEPSIS)
500.0000 mL | Freq: Once | INTRAVENOUS | Status: AC
  Administered 2014-09-10: 500 mL via INTRAVENOUS

## 2014-09-10 MED ORDER — SODIUM CHLORIDE 0.9 % IV BOLUS (SEPSIS)
1000.0000 mL | Freq: Once | INTRAVENOUS | Status: AC
  Administered 2014-09-10: 1000 mL via INTRAVENOUS

## 2014-09-10 MED ORDER — AMITRIPTYLINE HCL 10 MG PO TABS
10.0000 mg | ORAL_TABLET | Freq: Every day | ORAL | Status: DC
  Administered 2014-09-10: 10 mg via ORAL
  Filled 2014-09-10 (×2): qty 1

## 2014-09-10 MED ORDER — CARVEDILOL 3.125 MG PO TABS
3.1250 mg | ORAL_TABLET | Freq: Two times a day (BID) | ORAL | Status: DC
  Administered 2014-09-10 – 2014-09-11 (×2): 3.125 mg via ORAL
  Filled 2014-09-10 (×2): qty 1

## 2014-09-10 MED ORDER — PREDNISONE 20 MG PO TABS
30.0000 mg | ORAL_TABLET | Freq: Every day | ORAL | Status: DC
  Administered 2014-09-10 – 2014-09-11 (×2): 30 mg via ORAL
  Filled 2014-09-10 (×4): qty 1

## 2014-09-10 MED ORDER — SODIUM CHLORIDE 0.9 % IV SOLN
INTRAVENOUS | Status: DC
  Administered 2014-09-10 (×2): via INTRAVENOUS

## 2014-09-10 MED ORDER — IPRATROPIUM-ALBUTEROL 0.5-2.5 (3) MG/3ML IN SOLN
3.0000 mL | Freq: Three times a day (TID) | RESPIRATORY_TRACT | Status: DC
  Administered 2014-09-10 – 2014-09-11 (×2): 3 mL via RESPIRATORY_TRACT
  Filled 2014-09-10 (×3): qty 3

## 2014-09-10 NOTE — Progress Notes (Signed)
[facility] PROGRESS NOTE  [name] TXM:[id number] DOB: [birth year] DOA: 09/09/2014 PCP: Philis Fendt, MD  Brief Summary  [name] is a 59 y.o. female with past medical history significant for hypertension, hyperlipidemia, COPD, tobacco abuse, cocaine abuse, GERD and pneumothorax arthritis; who presented to the hospital secondary to chest pain. Patient reports that she use cocaine at the night prior to going to bed and around 4 AM woke up secondary to chest pain; the pain was localized in the middle of her chest and radiated to the left side and left arm; the pain was cramping/pressure like and lasted for approximately 4 hours without any alleviating or aggravating factors. Patient endorses some associated nausea and SOB. She denies fever, chills, diaphoresis, palpitations, vomiting, numbness/tingling, focal weakness or any other acute complaints. In the ED initial troponin has been negative, chest x-ray has demonstrated no acute cardiopulmonary process and EKG with new flattening/T waves inversions on the infero/lateral leads. [facility] hospitalist has been called to admit the patient for ACS r/o and to provide further evaluation and treatment.   Assessment/Plan  Cocaine-induced chest pain.   NM stress test was "low risk" with EF of 68%.  She will likely have chronic pain related to her longstanding substance abuse but would be high risk for abuse of any benzodiazepines or narcotics, lyrica, etc.  Advise referral to pain management center for frequent monitoring for substance abuse if she were to be prescribed any addictive medication.   -  Strongly advised her to stop using cocaine, patient states "I only use about once a month."  Told previous MD that she was ready to quit -  SW consult for local resources to help her quit -  troponins negative -  Tele:  NSR with occasional SVT -  Low dose carvedilol started by cardiology (hold parameter placed for low BP) -  Continue aspirin,  statins  -  D/c ACE inhibitors -  Check HIV  Hypotension, may be due to dehydration.  Maybe she is overmedicated with antihypertensives which are counteracting the effects of her cocaine.  Alternatively, this may be some relative adrenal insufficiency due to acute illness in a pateint on chronic prednisone.  Elevation of BUN:Cr suggests dehydration. -  NS boluses followed by IVF -  Start prednisone 30mg  daily x 3 days, then go back to home dose -  D/c ACEI -  Norvasc was given this morning, but will cancel for tomorrow morning  Acute on CKD (chronic kidney disease) stage 3, GFR 30-59 ml/min: baseline creatinine 1.1-1.2, currently 1.6, probably dehydrated or had some injury from her cocaine use -  IVF and repeat BMP in AM  Prolonged QTc possibly from cocaine, resolved as cocaine metabolized -  Added some medications known to cause QTc prolongation to her allergy list -  Would try to decrease her amitriptyline  Rheumatoid arthritis: Continue prednisone -No joint swelling or flares appreciated at this moment  Tobacco abuse: Cessation counseling has been provided -Will use nicotine patch  HLD (hyperlipidemia): Continue statins -Recent lipid profile done during her last admission (LDL 113 and total cholesterol 203)  COPD (chronic obstructive pulmonary disease): Wheezing but does not have increased sputum or change to sputum color -Will continue home inhalers regimen and as needed albuterol treatments -  Add scheduled duonebs -  If cough becomes productive, add abx and increase steroids further  GERD (gastroesophageal reflux disease):  Continue PPI and PRN GI cocktail -NSAIDs held  Diet:  Healthy heart Access:  PIV IVF:  yes  Proph:  heparin  Code Status: full Family Communication: patient alone Disposition Plan: pending improvement in blood pressure and creatinine, likely home tomorrow   Consultants:  Cardiology  Procedures:  CXR  NM stress test  Antibiotics:  none    HPI/Subjective:  States that she has ongoing chest pain.  Denies SOB.     Objective: Filed Vitals:   09/10/14 0544 09/10/14 1345 09/10/14 1415 09/10/14 1520  BP: 84/49 102/61 88/56 91/42   Pulse: 61     Temp: 98.1 F (36.7 C)     TempSrc: Oral     Resp:      Height:      Weight: 79.379 kg (175 lb)     SpO2: 98%       Intake/Output Summary (Last 24 hours) at 09/10/14 1553 Last data filed at 09/10/14 1500  Gross per 24 hour  Intake 591.67 ml  Output   1150 ml  Net -558.33 ml   Filed Weights   09/09/14 0529 09/09/14 1000 09/10/14 0544  Weight: 77.565 kg (171 lb) 76.658 kg (169 lb) 79.379 kg (175 lb)   Body mass index is 28.26 kg/(m^2).  Exam:   General:  Adult female, No acute distress  HEENT:  NCAT, MMM  Cardiovascular:  RRR, nl S1, S2 no mrg, 2+ pulses, warm extremities  Respiratory:  Congested wheezy cough, no focal rales or rhonchi, no increased WOB  Abdomen:   NABS, soft, NT/ND  MSK:   Normal tone and bulk, no LEE  Neuro:  Grossly intact  Data Reviewed: Basic Metabolic Panel:  Recent Labs Lab 09/09/14 0536 09/09/14 1020 09/09/14 1718 09/10/14 0519  NA 135  --   --  131*  K 4.9  --   --  4.2  CL 101  --   --  101  CO2 26  --   --  22  GLUCOSE 110*  --   --  109*  BUN 27*  --   --  38*  CREATININE 1.48* 1.27*  --  1.67*  CALCIUM 9.4  --   --  8.5*  MG  --   --  1.9  --    Liver Function Tests:  Recent Labs Lab 09/09/14 0536  AST 15  ALT 15  ALKPHOS 73  BILITOT 0.9  PROT 7.2  ALBUMIN 3.6   No results for input(s): LIPASE, AMYLASE in the last 168 hours. No results for input(s): AMMONIA in the last 168 hours. CBC:  Recent Labs Lab 09/09/14 0536 09/09/14 1020 09/10/14 0519  WBC 10.0 8.7 8.3  NEUTROABS 7.2  --   --   HGB 14.0 12.9 12.4  HCT 41.1 38.9 37.4  MCV 89.5 88.8 90.6  PLT 291 274 261    No results found for this or any previous visit (from the past 240 hour(s)).   Studies: Nm Myocar Multi W/spect W/wall Motion /  Ef  09/09/2014   CLINICAL DATA:  59 year old with chest pain.  EXAM: MYOCARDIAL IMAGING WITH SPECT (REST AND PHARMACOLOGIC-STRESS)  GATED LEFT VENTRICULAR WALL MOTION STUDY  LEFT VENTRICULAR EJECTION FRACTION  TECHNIQUE: Standard myocardial SPECT imaging was performed after resting intravenous injection of 10 mCi Tc-50m sestamibi. Subsequently, intravenous infusion of Lexiscan was performed under the supervision of the Cardiology staff. At peak effect of the drug, 30 mCi Tc-65m sestamibi was injected intravenously and standard myocardial SPECT imaging was performed. Quantitative gated imaging was also performed to evaluate left ventricular wall motion, and estimate left ventricular ejection fraction.  COMPARISON:  Chest  radiograph 09/09/2014  FINDINGS: Perfusion: No decreased activity in the left ventricle on stress imaging to suggest reversible ischemia or infarction.  Wall Motion: Normal left ventricular wall motion. No left ventricular dilation.  Left Ventricular Ejection Fraction: 68 %  End diastolic volume 63 ml  End systolic volume 20 ml  IMPRESSION: 1. No reversible ischemia or infarction.  2. Normal left ventricular wall motion.  3. Left ventricular ejection fraction 68%  4. Low-risk stress test findings*.  *2012 Appropriate Use Criteria for Coronary Revascularization Focused Update: J Am Coll Cardiol. 0349;17(9):150-569. http://content.airportbarriers.com.aspx?articleid=[email or web address]   Electronically Signed   By: Markus Daft M.D.   On: 09/09/2014 15:44   Dg Chest Port 1 View  09/09/2014   CLINICAL DATA:  Midsternal chest pain onset at 04:00  EXAM: PORTABLE CHEST - 1 VIEW  COMPARISON:  08/20/2014  FINDINGS: A single AP portable view of the chest demonstrates no focal airspace consolidation or alveolar edema. The lungs are grossly clear. There is no large effusion or pneumothorax. Cardiac and mediastinal contours appear unremarkable.  IMPRESSION: No active disease.   Electronically Signed   By: Andreas Newport M.D.   On: 09/09/2014 05:45    Scheduled Meds: . amitriptyline  10 mg Oral QHS  . amLODipine  5 mg Oral Daily  . aspirin EC  81 mg Oral Daily  . atorvastatin  40 mg Oral q1800  . budesonide (PULMICORT) nebulizer solution  0.25 mg Nebulization BID  . carvedilol  3.125 mg Oral BID WC  . docusate sodium  100 mg Oral Q12H  . guaiFENesin  600 mg Oral BID  . heparin  5,000 Units Subcutaneous 3 times per day  . nicotine  14 mg Transdermal Daily  . oxybutynin  5 mg Oral BID  . pantoprazole  40 mg Oral BID  . predniSONE  10 mg Oral Daily   Continuous Infusions: . sodium chloride 100 mL/hr at 09/10/14 1441    Principal Problem:   Chest pain at rest Active Problems:   Rheumatoid arthritis   HTN (hypertension), benign   Cocaine abuse   Chest pain   CKD (chronic kidney disease) stage 3, GFR 30-59 ml/min   Tobacco abuse   HLD (hyperlipidemia)   COPD (chronic obstructive pulmonary disease)   GERD (gastroesophageal reflux disease)    Time spent: 30 min    Edie Vallandingham, [facility] Pager [phone]. If 7PM-7AM, please contact night-coverage at [email or web address], password [facility] 09/10/2014, 3:53 PM

## 2014-09-10 NOTE — Progress Notes (Signed)
SUBJECTIVE:  Still complains of chest pain and left hand tingling.   PHYSICAL EXAM Filed Vitals:   09/09/14 1423 09/09/14 2000 09/09/14 2036 09/10/14 0544  BP: 106/50 101/55  84/49  Pulse: 80 72  61  Temp:  98.3 F (36.8 C)  98.1 F (36.7 C)  TempSrc:  Oral  Oral  Resp:  18    Height:      Weight:    175 lb (79.379 kg)  SpO2:  98% 98% 98%   General:  No distress Lungs:  Clear Heart:  RRR Abdomen:  Positive bowel sounds, no rebound no guarding Extremities:  No edema   LABS: Lab Results  Component Value Date   TROPONINI <0.03 09/09/2014   Results for orders placed or performed during the hospital encounter of 09/09/14 (from the past 24 hour(s))  CBC     Status: None   Collection Time: 09/09/14 10:20 AM  Result Value Ref Range   WBC 8.7 4.0 - 10.5 K/uL   RBC 4.38 3.87 - 5.11 MIL/uL   Hemoglobin 12.9 12.0 - 15.0 g/dL   HCT 38.9 36.0 - 46.0 %   MCV 88.8 78.0 - 100.0 fL   MCH 29.5 26.0 - 34.0 pg   MCHC 33.2 30.0 - 36.0 g/dL   RDW 14.6 11.5 - 15.5 %   Platelets 274 150 - 400 K/uL  Creatinine, serum     Status: Abnormal   Collection Time: 09/09/14 10:20 AM  Result Value Ref Range   Creatinine, Ser 1.27 (H) 0.44 - 1.00 mg/dL   GFR calc non Af Amer 45 (L) >60 mL/min   GFR calc Af Amer 52 (L) >60 mL/min  Troponin I (q 6hr x 3)     Status: None   Collection Time: 09/09/14 10:20 AM  Result Value Ref Range   Troponin I <0.03 <0.031 ng/mL  Troponin I (q 6hr x 3)     Status: None   Collection Time: 09/09/14  3:40 PM  Result Value Ref Range   Troponin I <0.03 <0.031 ng/mL  Magnesium     Status: None   Collection Time: 09/09/14  5:18 PM  Result Value Ref Range   Magnesium 1.9 1.7 - 2.4 mg/dL  Glucose, capillary     Status: Abnormal   Collection Time: 09/09/14  8:43 PM  Result Value Ref Range   Glucose-Capillary 126 (H) 65 - 99 mg/dL  Troponin I (q 6hr x 3)     Status: None   Collection Time: 09/09/14  9:58 PM  Result Value Ref Range   Troponin I <0.03 <0.031  ng/mL  CBC     Status: None   Collection Time: 09/10/14  5:19 AM  Result Value Ref Range   WBC 8.3 4.0 - 10.5 K/uL   RBC 4.13 3.87 - 5.11 MIL/uL   Hemoglobin 12.4 12.0 - 15.0 g/dL   HCT 37.4 36.0 - 46.0 %   MCV 90.6 78.0 - 100.0 fL   MCH 30.0 26.0 - 34.0 pg   MCHC 33.2 30.0 - 36.0 g/dL   RDW 14.6 11.5 - 15.5 %   Platelets 261 150 - 400 K/uL  Basic metabolic panel     Status: Abnormal   Collection Time: 09/10/14  5:19 AM  Result Value Ref Range   Sodium 131 (L) 135 - 145 mmol/L   Potassium 4.2 3.5 - 5.1 mmol/L   Chloride 101 101 - 111 mmol/L   CO2 22 22 - 32 mmol/L   Glucose, Bld  109 (H) 65 - 99 mg/dL   BUN 38 (H) 6 - 20 mg/dL   Creatinine, Ser 1.67 (H) 0.44 - 1.00 mg/dL   Calcium 8.5 (L) 8.9 - 10.3 mg/dL   GFR calc non Af Amer 32 (L) >60 mL/min   GFR calc Af Amer 37 (L) >60 mL/min   Anion gap 8 5 - 15    Intake/Output Summary (Last 24 hours) at 09/10/14 0945 Last data filed at 09/10/14 0900  Gross per 24 hour  Intake    500 ml  Output    200 ml  Net    300 ml    EKG:   Reviewed.  NSR, QT normal.  09/10/2014  ASSESSMENT AND PLAN:  CHEST PAIN:  No evidence of angina.  No further work up.    LONG QT:    Repeat EKG with normalization of the QT today.  No further work up.    CKD:  Creat is increased.  Plan per primary team.    Minus Breeding 09/10/2014 9:45 AM

## 2014-09-11 DIAGNOSIS — R079 Chest pain, unspecified: Secondary | ICD-10-CM | POA: Diagnosis not present

## 2014-09-11 LAB — BASIC METABOLIC PANEL
ANION GAP: 6 (ref 5–15)
BUN: 30 mg/dL — ABNORMAL HIGH (ref 6–20)
CO2: 24 mmol/L (ref 22–32)
Calcium: 9.2 mg/dL (ref 8.9–10.3)
Chloride: 107 mmol/L (ref 101–111)
Creatinine, Ser: 1.3 mg/dL — ABNORMAL HIGH (ref 0.44–1.00)
GFR, EST AFRICAN AMERICAN: 51 mL/min — AB (ref >60)
GFR, EST NON AFRICAN AMERICAN: 44 mL/min — AB (ref >60)
Glucose, Bld: 74 mg/dL (ref 65–99)
POTASSIUM: 4.5 mmol/L (ref 3.5–5.1)
SODIUM: 137 mmol/L (ref 135–145)

## 2014-09-11 MED ORDER — ACETAMINOPHEN 325 MG PO TABS: 650.0000 mg | ORAL_TABLET | ORAL | Status: DC | PRN

## 2014-09-11 MED ORDER — OXYCODONE-ACETAMINOPHEN 5-325 MG PO TABS: 1.0000 | ORAL_TABLET | Freq: Three times a day (TID) | ORAL | Status: DC | PRN

## 2014-09-11 NOTE — Progress Notes (Signed)
Pt discharge and instructions reviewed. VSS. Pt discharging home with sister

## 2014-09-11 NOTE — Discharge Summary (Signed)
Physician Discharge Summary  [name] LNL:[id number] DOB: April 23, 1955 DOA: 09/09/2014  PCP: Philis Fendt, MD  Admit date: 09/09/2014 Discharge date: 09/11/2014  Time spent: greater than 30 minutes  Recommendations for Outpatient Follow-up:  1. Monitor blood pressure  Discharge Diagnoses:  Principal Problem:   Chest pain at rest Active Problems:   Rheumatoid arthritis   HTN (hypertension), benign   Cocaine abuse   Chest pain   CKD (chronic kidney disease) stage 3, GFR 30-59 ml/min   Tobacco abuse   HLD (hyperlipidemia)   COPD (chronic obstructive pulmonary disease)   GERD (gastroesophageal reflux disease)   Hypotension   Dehydration   Discharge Condition: stable  Diet recommendation: heart healthy  Filed Weights   09/09/14 1000 09/10/14 0544 09/11/14 0521  Weight: 76.658 kg (169 lb) 79.379 kg (175 lb) 80.332 kg (177 lb 1.6 oz)    History of present illness/Hospital Course:  59 y.o. female with past medical history significant for hypertension, hyperlipidemia, COPD, tobacco abuse, cocaine abuse, GERD and pneumothorax arthritis; who presented to the hospital secondary to chest pain. Patient reports that she use cocaine at the night prior to going to bed and around 4 AM woke up secondary to chest pain; the pain was localized in the middle of her chest and radiated to the left side and left arm; the pain was cramping/pressure like and lasted for approximately 4 hours without any alleviating or aggravating factors. Patient endorses some associated nausea and SOB. She denies fever, chills, diaphoresis, palpitations, vomiting, numbness/tingling, focal weakness or any other acute complaints. In the ED initial troponin has been negative, chest x-ray has demonstrated no acute cardiopulmonary process and EKG with new flattening/T waves inversions on the infero/lateral leads.   Cocaine-induced chest pain. NM stress test was "low risk" with EF of 68%.  - Strongly advised her to  stop using cocaine, patient states "I only use about once a month." Told previous MD that she was ready to quit - SW consult for local resources to help her quit - troponins negative - Tele: NSR with occasional SVT - Continue aspirin, statins  - D/c ACE inhibitors  Hypotension, may be due to dehydration. started on stress dose steroids - NS boluses followed by IVF - Start prednisone 30mg  daily x 3 days, then go back to home dose - D/c ACEI At discharge, blood pressure normal  Acute on CKD (chronic kidney disease) stage 3, GFR 30-59 ml/min: baseline creatinine 1.1-1.2, currently 1.6, probably dehydrated or had some injury from her cocaine use  Prolonged QTc possibly from cocaine, resolved as cocaine metabolized  Rheumatoid arthritis: Continue prednisone  Tobacco abuse: Cessation counseling has been provided -Will use nicotine patch  HLD (hyperlipidemia): Continue statins -Recent lipid profile done during her last admission (LDL 113 and total cholesterol 203)  COPD (chronic obstructive pulmonary disease): Wheezing but does not have increased sputum or change to sputum color -Will continue home inhalers regimen and as needed albuterol treatments  GERD (gastroesophageal reflux disease): Continue PPI and PRN GI cocktail -NSAIDs held  Consultants:  Cardiology   Procedures:  none   Discharge Exam: Filed Vitals:   09/11/14 0751  BP: 127/75  Pulse:   Temp:   Resp:     General: a and o Cardiovascular: RRR Respiratory:CTA  Discharge Instructions   Discharge Instructions    Activity as tolerated - No restrictions    Complete by:  As directed      Diet - low sodium heart healthy    Complete by:  As directed      Discharge instructions    Complete by:  As directed   Stop amlodipine, stop nitrodur, stop lisinopril          Current Discharge Medication List    START taking these medications   Details  acetaminophen (TYLENOL) 325 MG tablet Take 2  tablets (650 mg total) by mouth every 4 (four) hours as needed for headache or mild pain.      CONTINUE these medications which have CHANGED   Details  oxyCODONE-acetaminophen (PERCOCET/ROXICET) 5-325 MG per tablet Take 1 tablet by mouth every 8 (eight) hours as needed for severe pain. Qty: 10 tablet, Refills: 0      CONTINUE these medications which have NOT CHANGED   Details  albuterol (PROVENTIL HFA;VENTOLIN HFA) 108 (90 BASE) MCG/ACT inhaler Inhale 2 puffs into the lungs every 6 (six) hours as needed for wheezing or shortness of breath.    amitriptyline (ELAVIL) 25 MG tablet Take 25 mg by mouth at bedtime.  Refills: 0    aspirin EC 81 MG tablet Take 81 mg by mouth daily.    atorvastatin (LIPITOR) 40 MG tablet Take 1 tablet (40 mg total) by mouth daily at 6 PM. Qty: 30 tablet, Refills: 4    beclomethasone (QVAR) 80 MCG/ACT inhaler Inhale 2 puffs into the lungs 2 (two) times daily.    ibuprofen (ADVIL,MOTRIN) 200 MG tablet Take 800 mg by mouth every 6 (six) hours as needed for moderate pain.     naproxen sodium (ANAPROX) 220 MG tablet Take 440 mg by mouth 3 (three) times daily as needed (pain). ALEVE    nicotine (NICODERM CQ - DOSED IN MG/24 HOURS) 14 mg/24hr patch Place 1 patch (14 mg total) onto the skin daily. Qty: 28 patch, Refills: 3    ondansetron (ZOFRAN) 4 MG tablet Take 1 tablet (4 mg total) by mouth every 8 (eight) hours as needed for nausea or vomiting. Qty: 30 tablet, Refills: 0    OVER THE COUNTER MEDICATION Apply 1 application topically daily as needed (rash on buttocks from pads). Sensi Care Protective Barrier (15% zinc oxide, 49% petrolatum)    oxybutynin (DITROPAN) 5 MG tablet Take 5 mg by mouth 2 (two) times daily. Refills: 0    predniSONE (DELTASONE) 10 MG tablet Take 10 mg by mouth daily. Continuous course Refills: 0    docusate sodium (COLACE) 100 MG capsule Take 1 capsule (100 mg total) by mouth every 12 (twelve) hours. Qty: 60 capsule, Refills: 0       STOP taking these medications     amLODipine (NORVASC) 5 MG tablet      lisinopril (PRINIVIL,ZESTRIL) 20 MG tablet      nitroGLYCERIN (NITRODUR - DOSED IN MG/24 HR) 0.4 mg/hr patch        Allergies  Allergen Reactions  . Iohexol Anaphylaxis, Swelling and Other (See Comments)      Throat swelling    . Levofloxacin In D5w Other (See Comments)    Has baseline prolonged QTc.  Avoid QTc prolonging medications.  . Penicillins Itching  . 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 Zofran [Ondansetron Hcl] Other (See Comments)    Has baseline prolonged QTc.  Avoid QTc prolonging medications.   Follow-up Information    Follow up with Philis Fendt, MD.   Specialty:  Internal Medicine   Contact information:   [address] [place] [address] [phone]  The results of significant diagnostics from this hospitalization (including imaging, microbiology, ancillary and laboratory) are listed below for reference.    Significant Diagnostic Studies: Dg Chest 2 View  08/20/2014   CLINICAL DATA:  Acute chest pain.  EXAM: CHEST  2 VIEW  COMPARISON:  February 28, 2014.  FINDINGS: The heart size and mediastinal contours are within normal limits. No pneumothorax or pleural effusion is noted. Right lung is clear. Minimal left basilar subsegmental atelectasis is noted. The visualized skeletal structures are unremarkable.  IMPRESSION: Minimal left basilar subsegmental atelectasis.   Electronically Signed   By: Marijo Conception, M.D.   On: 08/20/2014 18:04   US Abdomen Complete  08/20/2014   CLINICAL DATA:  Chest and upper abdominal pain  EXAM: ULTRASOUND ABDOMEN COMPLETE  COMPARISON:  CT abdomen and pelvis November 18, 2013  FINDINGS: Gallbladder: No gallstones or wall thickening visualized. There is no pericholecystic fluid. No sonographic Murphy sign noted.  Common bile duct: Diameter: 3 mm. There is no intrahepatic,  common hepatic, or common bile duct dilatation.  Liver: No focal lesion identified. Within normal limits in parenchymal echogenicity.  IVC: No abnormality visualized.  Pancreas: No mass or inflammatory focus. Pancreatic duct is prominent measuring 5 mm. No obstructing lesion is seen by ultrasound in the region of the pancreatic duct.  Spleen: Size and appearance within normal limits.  Right Kidney: Length: 10.1 cm. Echogenicity within normal limits. No mass or hydronephrosis visualized.  Left Kidney: Length: 10.7 cm. Echogenicity within normal limits. No hydronephrosis visualized. There is a cyst in the mid left kidney measuring 1.8 x 1.2 x 1.8 cm  Abdominal aorta: No aneurysm visualized.  Other findings: No demonstrable ascites.  IMPRESSION: Prominence of the pancreatic duct without mass or inflammatory focus appreciable by ultrasound. Significance of this finding is uncertain. Appropriate laboratory values to assess for pancreatitis advised. This finding may warrant nonemergent pre and postcontrast pancreatic MR to further evaluate.  Small left renal cyst.  Study otherwise unremarkable.   Electronically Signed   By: Lowella Grip III M.D.   On: 08/20/2014 22:13   Nm Myocar Multi W/spect W/wall Motion / Ef  09/09/2014   CLINICAL DATA:  59 year old with chest pain.  EXAM: MYOCARDIAL IMAGING WITH SPECT (REST AND PHARMACOLOGIC-STRESS)  GATED LEFT VENTRICULAR WALL MOTION STUDY  LEFT VENTRICULAR EJECTION FRACTION  TECHNIQUE: Standard myocardial SPECT imaging was performed after resting intravenous injection of 10 mCi Tc-28m sestamibi. Subsequently, intravenous infusion of Lexiscan was performed under the supervision of the Cardiology staff. At peak effect of the drug, 30 mCi Tc-43m sestamibi was injected intravenously and standard myocardial SPECT imaging was performed. Quantitative gated imaging was also performed to evaluate left ventricular wall motion, and estimate left ventricular ejection fraction.   COMPARISON:  Chest radiograph 09/09/2014  FINDINGS: Perfusion: No decreased activity in the left ventricle on stress imaging to suggest reversible ischemia or infarction.  Wall Motion: Normal left ventricular wall motion. No left ventricular dilation.  Left Ventricular Ejection Fraction: 68 %  End diastolic volume 63 ml  End systolic volume 20 ml  IMPRESSION: 1. No reversible ischemia or infarction.  2. Normal left ventricular wall motion.  3. Left ventricular ejection fraction 68%  4. Low-risk stress test findings*.  *2012 Appropriate Use Criteria for Coronary Revascularization Focused Update: J Am Coll Cardiol. 1478;29(5):621-308. http://content.airportbarriers.com.aspx?articleid=1201161   Electronically Signed   By: Markus Daft M.D.   On: 09/09/2014 15:44   Dg Chest Port 1 View  09/09/2014   CLINICAL DATA:  Midsternal chest pain onset at 04:00  EXAM: PORTABLE CHEST - 1 VIEW  COMPARISON:  08/20/2014  FINDINGS: A single AP portable view of the chest demonstrates no focal airspace consolidation or alveolar edema. The lungs are grossly clear. There is no large effusion or pneumothorax. Cardiac and mediastinal contours appear unremarkable.  IMPRESSION: No active disease.   Electronically Signed   By: Andreas Newport M.D.   On: 09/09/2014 05:45    Microbiology: No results found for this or any previous visit (from the past 240 hour(s)).   Labs: Basic Metabolic Panel:  Recent Labs Lab 09/09/14 0536 09/09/14 1020 09/09/14 1718 09/10/14 0519 09/11/14 0901  NA 135  --   --  131* 137  K 4.9  --   --  4.2 4.5  CL 101  --   --  101 107  CO2 26  --   --  22 24  GLUCOSE 110*  --   --  109* 74  BUN 27*  --   --  38* 30*  CREATININE 1.48* 1.27*  --  1.67* 1.30*  CALCIUM 9.4  --   --  8.5* 9.2  MG  --   --  1.9  --   --    Liver Function Tests:  Recent Labs Lab 09/09/14 0536  AST 15  ALT 15  ALKPHOS 73  BILITOT 0.9  PROT 7.2  ALBUMIN 3.6   No results for input(s): LIPASE, AMYLASE in the  last 168 hours. No results for input(s): AMMONIA in the last 168 hours. CBC:  Recent Labs Lab 09/09/14 0536 09/09/14 1020 09/10/14 0519  WBC 10.0 8.7 8.3  NEUTROABS 7.2  --   --   HGB 14.0 12.9 12.4  HCT 41.1 38.9 37.4  MCV 89.5 88.8 90.6  PLT 291 274 261   Cardiac Enzymes:  Recent Labs Lab 09/09/14 1020 09/09/14 1540 09/09/14 2158  TROPONINI <0.03 <0.03 <0.03   BNP: BNP (last 3 results)  Recent Labs  02/28/14 1646  BNP 22.6    ProBNP (last 3 results) No results for input(s): PROBNP in the last 8760 hours.  CBG:  Recent Labs Lab 09/09/14 2043  GLUCAP 126*       Signed:  [place] [facility]  [facility] 09/11/2014, 11:47 AM

## 2014-10-01 IMAGING — US US BREAST*R* LIMITED INC AXILLA
1 series · 2 of 2 positions shown · non-contrast
Comparison: none

[Series 1: us breast*right* limited inc axilla · 2 of 2 slices shown]
[im 1/2]
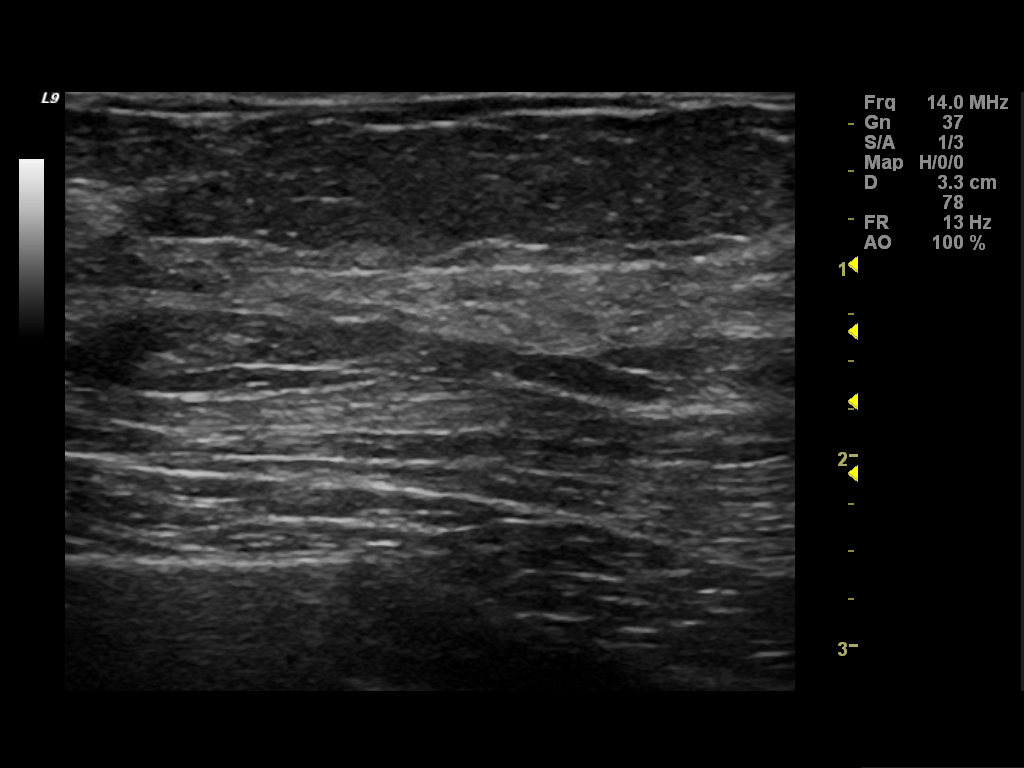
[im 2/2]
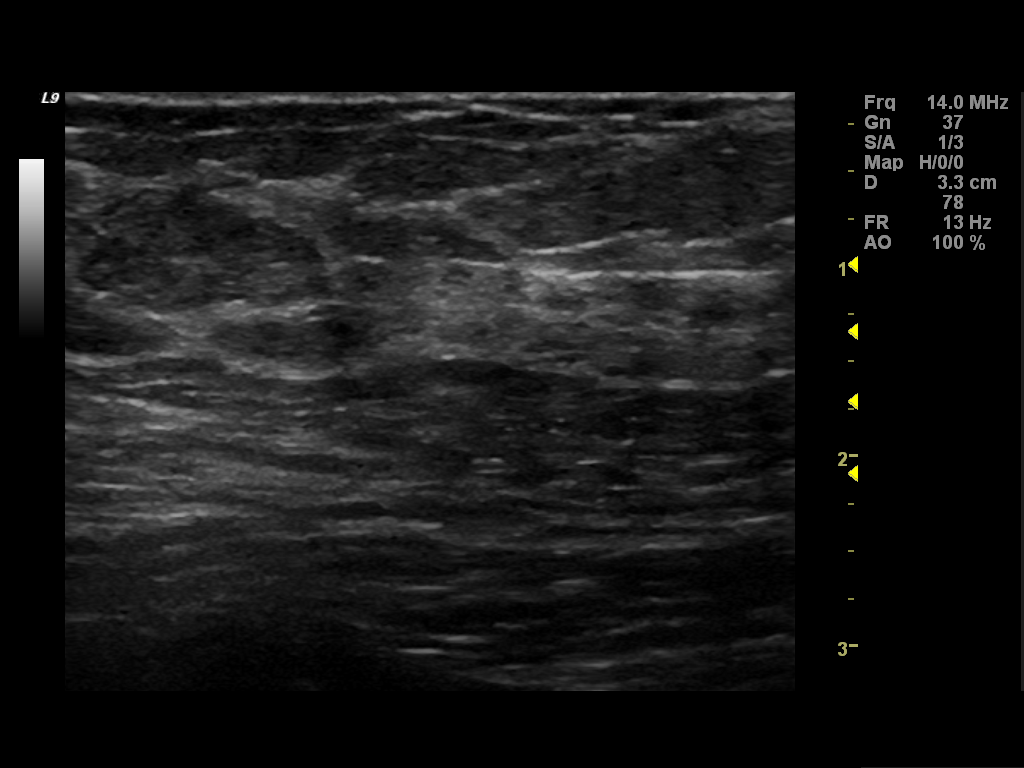

[2 of 2 positions shown; findings below may reference images not displayed]

CLINICAL DATA
Call back from screening. Patient underwent diagnostic mammography
on 02/16/2013 with spot compression and magnification imaging for
areas of asymmetry and left breast calcifications. Ultrasound of the
right breast was recommended. There is a small oval possible mass on
the CC view of the right breast, that may reflect an intramammary
lymph node.

EXAM
ULTRASOUND OF THE RIGHT BREAST

COMPARISON
02/16/2013 have 01/24/2013

FINDINGS
FINDINGS

Ultrasound is performed, showing normal fibroglandular tissue. No
mass, lymph node or cyst is seen. The entire upper right breast was
imaged, focusing on the 12 o'clock position.

IMPRESSION
No evidence of malignancy. Calcifications noted in the left breast,
upon review, are smooth, and benign in overall appearance and
configuration. These are felt to be benign.

RECOMMENDATION
Screening mammogram in one year.(Code:JT-P-B75)

I have discussed the findings and recommendations with the patient.
Results were also provided in writing at the conclusion of the
visit. If applicable, a reminder letter will be sent to the patient
regarding the next appointment.

BI-RADS CATEGORY
2: Benign Finding(s)

SIGNATURE

## 2014-10-04 ENCOUNTER — Encounter: Payer: Self-pay | Admitting: Cardiovascular Disease

## 2014-10-04 ENCOUNTER — Ambulatory Visit (INDEPENDENT_AMBULATORY_CARE_PROVIDER_SITE_OTHER): Payer: Medicaid Other | Admitting: Cardiovascular Disease

## 2014-10-04 VITALS — BP 148/88 | HR 80 | Ht 66.0 in | Wt 169.8 lb

## 2014-10-04 DIAGNOSIS — I10 Essential (primary) hypertension: Secondary | ICD-10-CM | POA: Diagnosis not present

## 2014-10-04 DIAGNOSIS — F1410 Cocaine abuse, uncomplicated: Secondary | ICD-10-CM

## 2014-10-04 DIAGNOSIS — R079 Chest pain, unspecified: Secondary | ICD-10-CM

## 2014-10-04 NOTE — Patient Instructions (Signed)
Medication Instructions:  Your physician recommends that you continue on your current medications as directed. Please refer to the Current Medication list given to you today.   Labwork: None ordered  Testing/Procedures: None ordered  Follow-Up: Your physician recommends that you schedule a follow-up appointment with Dr.Austin as needed   Any Other Special Instructions Will Be Listed Below (If Applicable).
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
## 2014-10-28 ENCOUNTER — Emergency Department (HOSPITAL_COMMUNITY): Payer: Medicaid Other

## 2014-10-28 ENCOUNTER — Encounter (HOSPITAL_COMMUNITY): Payer: Self-pay | Admitting: Oncology

## 2014-10-28 ENCOUNTER — Inpatient Hospital Stay (HOSPITAL_COMMUNITY)
Admission: EM | Admit: 2014-10-28 | Discharge: 2014-10-31 | DRG: 871 | Disposition: A | Discharge status: Home / Self Care | Payer: Medicaid Other | Attending: Internal Medicine | Admitting: Internal Medicine

## 2014-10-28 DIAGNOSIS — B9620 Unspecified Escherichia coli [E. coli] as the cause of diseases classified elsewhere: Secondary | ICD-10-CM | POA: Diagnosis present

## 2014-10-28 DIAGNOSIS — N179 Acute kidney failure, unspecified: Secondary | ICD-10-CM | POA: Diagnosis present

## 2014-10-28 DIAGNOSIS — M069 Rheumatoid arthritis, unspecified: Secondary | ICD-10-CM | POA: Diagnosis present

## 2014-10-28 DIAGNOSIS — J449 Chronic obstructive pulmonary disease, unspecified: Secondary | ICD-10-CM | POA: Diagnosis present

## 2014-10-28 DIAGNOSIS — G9340 Encephalopathy, unspecified: Secondary | ICD-10-CM | POA: Diagnosis not present

## 2014-10-28 DIAGNOSIS — E872 Acidosis, unspecified: Secondary | ICD-10-CM | POA: Insufficient documentation

## 2014-10-28 DIAGNOSIS — D72829 Elevated white blood cell count, unspecified: Secondary | ICD-10-CM | POA: Insufficient documentation

## 2014-10-28 DIAGNOSIS — I10 Essential (primary) hypertension: Secondary | ICD-10-CM | POA: Diagnosis present

## 2014-10-28 DIAGNOSIS — N183 Chronic kidney disease, stage 3 (moderate): Secondary | ICD-10-CM | POA: Diagnosis present

## 2014-10-28 DIAGNOSIS — Z825 Family history of asthma and other chronic lower respiratory diseases: Secondary | ICD-10-CM

## 2014-10-28 DIAGNOSIS — R079 Chest pain, unspecified: Secondary | ICD-10-CM | POA: Diagnosis not present

## 2014-10-28 DIAGNOSIS — K219 Gastro-esophageal reflux disease without esophagitis: Secondary | ICD-10-CM | POA: Diagnosis present

## 2014-10-28 DIAGNOSIS — I2510 Atherosclerotic heart disease of native coronary artery without angina pectoris: Secondary | ICD-10-CM | POA: Diagnosis present

## 2014-10-28 DIAGNOSIS — Z79899 Other long term (current) drug therapy: Secondary | ICD-10-CM | POA: Diagnosis not present

## 2014-10-28 DIAGNOSIS — R6510 Systemic inflammatory response syndrome (SIRS) of non-infectious origin without acute organ dysfunction: Secondary | ICD-10-CM | POA: Insufficient documentation

## 2014-10-28 DIAGNOSIS — I129 Hypertensive chronic kidney disease with stage 1 through stage 4 chronic kidney disease, or unspecified chronic kidney disease: Secondary | ICD-10-CM | POA: Diagnosis present

## 2014-10-28 DIAGNOSIS — J029 Acute pharyngitis, unspecified: Secondary | ICD-10-CM | POA: Diagnosis present

## 2014-10-28 DIAGNOSIS — Z8249 Family history of ischemic heart disease and other diseases of the circulatory system: Secondary | ICD-10-CM | POA: Diagnosis not present

## 2014-10-28 DIAGNOSIS — G92 Toxic encephalopathy: Secondary | ICD-10-CM | POA: Diagnosis present

## 2014-10-28 DIAGNOSIS — F1410 Cocaine abuse, uncomplicated: Secondary | ICD-10-CM | POA: Diagnosis not present

## 2014-10-28 DIAGNOSIS — R509 Fever, unspecified: Secondary | ICD-10-CM | POA: Diagnosis not present

## 2014-10-28 DIAGNOSIS — N390 Urinary tract infection, site not specified: Secondary | ICD-10-CM | POA: Diagnosis present

## 2014-10-28 DIAGNOSIS — F17210 Nicotine dependence, cigarettes, uncomplicated: Secondary | ICD-10-CM | POA: Diagnosis present

## 2014-10-28 DIAGNOSIS — A419 Sepsis, unspecified organism: Secondary | ICD-10-CM | POA: Diagnosis not present

## 2014-10-28 DIAGNOSIS — Z9119 Patient's noncompliance with other medical treatment and regimen: Secondary | ICD-10-CM | POA: Diagnosis not present

## 2014-10-28 LAB — CBC WITH DIFFERENTIAL/PLATELET
BASOS ABS: 0 10*3/uL (ref 0.0–0.1)
BASOS ABS: 0 10*3/uL (ref 0.0–0.1)
Basophils Relative: 0 %
Basophils Relative: 0 %
EOS ABS: 0.2 10*3/uL (ref 0.0–0.7)
Eosinophils Absolute: 0.4 10*3/uL (ref 0.0–0.7)
Eosinophils Relative: 1 %
Eosinophils Relative: 2 %
HEMATOCRIT: 21.4 % — AB (ref 36.0–46.0)
HEMATOCRIT: 35.3 % — AB (ref 36.0–46.0)
HEMOGLOBIN: 7.2 g/dL — AB (ref 12.0–15.0)
Hemoglobin: 11.7 g/dL — ABNORMAL LOW (ref 12.0–15.0)
LYMPHS ABS: 1.8 10*3/uL (ref 0.7–4.0)
LYMPHS PCT: 12 %
LYMPHS PCT: 12 %
Lymphs Abs: 2.6 10*3/uL (ref 0.7–4.0)
MCH: 30.4 pg (ref 26.0–34.0)
MCH: 29.8 pg (ref 26.0–34.0)
MCHC: 33.6 g/dL (ref 30.0–36.0)
MCHC: 33.1 g/dL (ref 30.0–36.0)
MCV: 90.3 fL (ref 78.0–100.0)
MCV: 89.8 fL (ref 78.0–100.0)
MONO ABS: 1.2 10*3/uL — AB (ref 0.1–1.0)
MONOS PCT: 6 %
MONOS PCT: 7 %
Monocytes Absolute: 1.3 10*3/uL — ABNORMAL HIGH (ref 0.1–1.0)
NEUTROS ABS: 12.2 10*3/uL — AB (ref 1.7–7.7)
Neutro Abs: 17.3 10*3/uL — ABNORMAL HIGH (ref 1.7–7.7)
Neutrophils Relative %: 81 %
Neutrophils Relative %: 79 %
Platelets: 374 10*3/uL (ref 150–400)
Platelets: 230 10*3/uL (ref 150–400)
RBC: 2.37 MIL/uL — AB (ref 3.87–5.11)
RBC: 3.93 MIL/uL (ref 3.87–5.11)
RDW: 13.6 % (ref 11.5–15.5)
RDW: 14 % (ref 11.5–15.5)
WBC: 15.6 10*3/uL — ABNORMAL HIGH (ref 4.0–10.5)
WBC: 21.4 10*3/uL — AB (ref 4.0–10.5)

## 2014-10-28 LAB — URINALYSIS, ROUTINE W REFLEX MICROSCOPIC
Bilirubin Urine: NEGATIVE
GLUCOSE, UA: NEGATIVE mg/dL
KETONES UR: NEGATIVE mg/dL
Nitrite: NEGATIVE
PROTEIN: NEGATIVE mg/dL
Specific Gravity, Urine: 1.017 (ref 1.005–1.030)
Urobilinogen, UA: 2 mg/dL — ABNORMAL HIGH (ref 0.0–1.0)
pH: 6 (ref 5.0–8.0)

## 2014-10-28 LAB — COMPREHENSIVE METABOLIC PANEL
ALT: 16 U/L (ref 14–54)
ALT: 14 U/L (ref 14–54)
AST: 20 U/L (ref 15–41)
AST: 29 U/L (ref 15–41)
Albumin: 3.3 g/dL — ABNORMAL LOW (ref 3.5–5.0)
Albumin: 2.8 g/dL — ABNORMAL LOW (ref 3.5–5.0)
Alkaline Phosphatase: 83 U/L (ref 38–126)
Alkaline Phosphatase: 71 U/L (ref 38–126)
Anion gap: 7 (ref 5–15)
Anion gap: 7 (ref 5–15)
BILIRUBIN TOTAL: 0.7 mg/dL (ref 0.3–1.2)
BUN: 14 mg/dL (ref 6–20)
BUN: 17 mg/dL (ref 6–20)
CHLORIDE: 109 mmol/L (ref 101–111)
CO2: 18 mmol/L — ABNORMAL LOW (ref 22–32)
CO2: 21 mmol/L — AB (ref 22–32)
Calcium: 8.5 mg/dL — ABNORMAL LOW (ref 8.9–10.3)
Calcium: 7.7 mg/dL — ABNORMAL LOW (ref 8.9–10.3)
Chloride: 113 mmol/L — ABNORMAL HIGH (ref 101–111)
Creatinine, Ser: 1.24 mg/dL — ABNORMAL HIGH (ref 0.44–1.00)
Creatinine, Ser: 1.09 mg/dL — ABNORMAL HIGH (ref 0.44–1.00)
GFR, EST AFRICAN AMERICAN: 54 mL/min — AB (ref >60)
GFR, EST NON AFRICAN AMERICAN: 46 mL/min — AB (ref >60)
GFR, EST NON AFRICAN AMERICAN: 54 mL/min — AB (ref >60)
Glucose, Bld: 107 mg/dL — ABNORMAL HIGH (ref 65–99)
Glucose, Bld: 97 mg/dL (ref 65–99)
POTASSIUM: 3.3 mmol/L — AB (ref 3.5–5.1)
POTASSIUM: 3.4 mmol/L — AB (ref 3.5–5.1)
SODIUM: 137 mmol/L (ref 135–145)
Sodium: 138 mmol/L (ref 135–145)
TOTAL PROTEIN: 5.8 g/dL — AB (ref 6.5–8.1)
Total Bilirubin: 0.8 mg/dL (ref 0.3–1.2)
Total Protein: 6.7 g/dL (ref 6.5–8.1)

## 2014-10-28 LAB — RAPID URINE DRUG SCREEN, HOSP PERFORMED
Amphetamines: NOT DETECTED
Barbiturates: NOT DETECTED
Benzodiazepines: NOT DETECTED
Cocaine: POSITIVE — AB
Opiates: NOT DETECTED
Tetrahydrocannabinol: NOT DETECTED

## 2014-10-28 LAB — BLOOD GAS, ARTERIAL
Acid-base deficit: 4.3 mmol/L — ABNORMAL HIGH (ref 0.0–2.0)
Bicarbonate: 18.7 meq/L — ABNORMAL LOW (ref 20.0–24.0)
Drawn by: 232811
FIO2: 0.21
O2 Saturation: 92.8 %
Patient temperature: 99.3
TCO2: 16.9 mmol/L (ref 0–100)
pCO2 arterial: 29.9 mmHg — ABNORMAL LOW (ref 35.0–45.0)
pH, Arterial: 7.414 (ref 7.350–7.450)
pO2, Arterial: 71.3 mmHg — ABNORMAL LOW (ref 80.0–100.0)

## 2014-10-28 LAB — URINE MICROSCOPIC-ADD ON

## 2014-10-28 LAB — I-STAT CHEM 8, ED
BUN: 17 mg/dL (ref 6–20)
CREATININE: 1.2 mg/dL — AB (ref 0.44–1.00)
Calcium, Ion: 1.1 mmol/L — ABNORMAL LOW (ref 1.13–1.30)
Chloride: 107 mmol/L (ref 101–111)
Glucose, Bld: 106 mg/dL — ABNORMAL HIGH (ref 65–99)
HEMATOCRIT: 53 % — AB (ref 36.0–46.0)
HEMOGLOBIN: 18 g/dL — AB (ref 12.0–15.0)
POTASSIUM: 3.4 mmol/L — AB (ref 3.5–5.1)
SODIUM: 139 mmol/L (ref 135–145)
TCO2: 19 mmol/L (ref 0–100)

## 2014-10-28 LAB — TSH: TSH: 0.541 u[IU]/mL (ref 0.350–4.500)

## 2014-10-28 LAB — INFLUENZA PANEL BY PCR (TYPE A & B)
H1N1 flu by pcr: NOT DETECTED
Influenza A By PCR: NEGATIVE
Influenza B By PCR: NEGATIVE

## 2014-10-28 LAB — PROCALCITONIN: Procalcitonin: 0.76 ng/mL

## 2014-10-28 LAB — PROTIME-INR
INR: 1.18 (ref 0.00–1.49)
Prothrombin Time: 15.1 s (ref 11.6–15.2)

## 2014-10-28 LAB — APTT: aPTT: 35 s (ref 24–37)

## 2014-10-28 LAB — I-STAT TROPONIN, ED: Troponin i, poc: 0.01 ng/mL (ref 0.00–0.08)

## 2014-10-28 LAB — ETHANOL: Alcohol, Ethyl (B): <5 mg/dL

## 2014-10-28 LAB — RAPID STREP SCREEN (MED CTR MEBANE ONLY): STREPTOCOCCUS, GROUP A SCREEN (DIRECT): NEGATIVE

## 2014-10-28 LAB — LACTATE DEHYDROGENASE: LDH: 155 U/L (ref 98–192)

## 2014-10-28 LAB — AMMONIA: Ammonia: 26 umol/L (ref 9–35)

## 2014-10-28 LAB — MRSA PCR SCREENING: MRSA by PCR: NEGATIVE

## 2014-10-28 LAB — LACTIC ACID, PLASMA: LACTIC ACID, VENOUS: 0.9 mmol/L (ref 0.5–2.0)

## 2014-10-28 LAB — TYPE AND SCREEN
ABO/RH(D): B POS
Antibody Screen: NEGATIVE

## 2014-10-28 LAB — TROPONIN I

## 2014-10-28 LAB — OCCULT BLOOD X 1 CARD TO LAB, STOOL: Fecal Occult Bld: NEGATIVE

## 2014-10-28 LAB — FIBRINOGEN: FIBRINOGEN: 653 mg/dL — AB (ref 204–475)

## 2014-10-28 LAB — ABO/RH: ABO/RH(D): B POS

## 2014-10-28 LAB — I-STAT CG4 LACTIC ACID, ED: LACTIC ACID, VENOUS: 0.83 mmol/L (ref 0.5–2.0)

## 2014-10-28 MED ORDER — CETYLPYRIDINIUM CHLORIDE 0.05 % MT LIQD
7.0000 mL | Freq: Two times a day (BID) | OROMUCOSAL | Status: DC
  Administered 2014-10-28 (×2): 7 mL via OROMUCOSAL

## 2014-10-28 MED ORDER — SODIUM CHLORIDE 0.9 % IJ SOLN
3.0000 mL | Freq: Two times a day (BID) | INTRAMUSCULAR | Status: DC
  Administered 2014-10-28 – 2014-10-29 (×4): 3 mL via INTRAVENOUS

## 2014-10-28 MED ORDER — DEXTROSE 5 % IV SOLN
2.0000 g | Freq: Three times a day (TID) | INTRAVENOUS | Status: DC
  Filled 2014-10-28 (×2): qty 2

## 2014-10-28 MED ORDER — HYDROCODONE-ACETAMINOPHEN 5-325 MG PO TABS
1.0000 | ORAL_TABLET | ORAL | Status: DC | PRN
  Administered 2014-10-28 (×2): 1 via ORAL
  Administered 2014-10-29 – 2014-10-30 (×4): 2 via ORAL
  Filled 2014-10-28: qty 2
  Filled 2014-10-28: qty 1
  Filled 2014-10-28 (×3): qty 2
  Filled 2014-10-28: qty 1

## 2014-10-28 MED ORDER — HYDROCORTISONE NA SUCCINATE PF 100 MG IJ SOLR
100.0000 mg | Freq: Two times a day (BID) | INTRAMUSCULAR | Status: DC
  Administered 2014-10-28 – 2014-10-29 (×4): 100 mg via INTRAVENOUS
  Filled 2014-10-28 (×4): qty 2

## 2014-10-28 MED ORDER — SODIUM CHLORIDE 0.9 % IV SOLN
INTRAVENOUS | Status: DC
  Administered 2014-10-28: 06:00:00 via INTRAVENOUS

## 2014-10-28 MED ORDER — ACETAMINOPHEN 650 MG RE SUPP: 650.0000 mg | Freq: Four times a day (QID) | RECTAL | Status: DC | PRN

## 2014-10-28 MED ORDER — BUDESONIDE 0.25 MG/2ML IN SUSP
0.2500 mg | Freq: Two times a day (BID) | RESPIRATORY_TRACT | Status: DC
  Administered 2014-10-28 – 2014-10-31 (×6): 0.25 mg via RESPIRATORY_TRACT
  Filled 2014-10-28 (×6): qty 2

## 2014-10-28 MED ORDER — PANTOPRAZOLE SODIUM 40 MG IV SOLR
40.0000 mg | INTRAVENOUS | Status: DC
  Administered 2014-10-28 – 2014-10-29 (×2): 40 mg via INTRAVENOUS
  Filled 2014-10-28 (×2): qty 40

## 2014-10-28 MED ORDER — ASPIRIN EC 81 MG PO TBEC
81.0000 mg | DELAYED_RELEASE_TABLET | Freq: Every day | ORAL | Status: DC
  Administered 2014-10-28 – 2014-10-31 (×4): 81 mg via ORAL
  Filled 2014-10-28 (×4): qty 1

## 2014-10-28 MED ORDER — SODIUM CHLORIDE 0.9 % IV BOLUS (SEPSIS)
1000.0000 mL | Freq: Once | INTRAVENOUS | Status: AC
  Administered 2014-10-28: 1000 mL via INTRAVENOUS

## 2014-10-28 MED ORDER — VANCOMYCIN HCL IN DEXTROSE 750-5 MG/150ML-% IV SOLN
750.0000 mg | Freq: Two times a day (BID) | INTRAVENOUS | Status: DC
  Administered 2014-10-28 – 2014-10-29 (×2): 750 mg via INTRAVENOUS
  Filled 2014-10-28 (×3): qty 150

## 2014-10-28 MED ORDER — ATORVASTATIN CALCIUM 40 MG PO TABS
40.0000 mg | ORAL_TABLET | Freq: Every day | ORAL | Status: DC
  Administered 2014-10-28 – 2014-10-31 (×4): 40 mg via ORAL
  Filled 2014-10-28 (×4): qty 1

## 2014-10-28 MED ORDER — SODIUM CHLORIDE 0.9 % IV BOLUS (SEPSIS)
1000.0000 mL | INTRAVENOUS | Status: AC
  Administered 2014-10-28 (×2): 1000 mL via INTRAVENOUS

## 2014-10-28 MED ORDER — HEPARIN SODIUM (PORCINE) 5000 UNIT/ML IJ SOLN
5000.0000 [IU] | Freq: Three times a day (TID) | INTRAMUSCULAR | Status: DC
  Administered 2014-10-28 – 2014-10-30 (×7): 5000 [IU] via SUBCUTANEOUS
  Filled 2014-10-28 (×7): qty 1

## 2014-10-28 MED ORDER — PREDNISONE 10 MG PO TABS: 10.0000 mg | ORAL_TABLET | Freq: Every day | ORAL | Status: DC

## 2014-10-28 MED ORDER — ACETAMINOPHEN 325 MG PO TABS
650.0000 mg | ORAL_TABLET | Freq: Four times a day (QID) | ORAL | Status: DC | PRN
  Administered 2014-10-28 – 2014-10-30 (×2): 650 mg via ORAL
  Filled 2014-10-28 (×2): qty 2

## 2014-10-28 MED ORDER — SODIUM BICARBONATE 8.4 % IV SOLN
INTRAVENOUS | Status: DC
  Administered 2014-10-28 – 2014-10-29 (×2): via INTRAVENOUS
  Filled 2014-10-28 (×2): qty 150

## 2014-10-28 MED ORDER — BECLOMETHASONE DIPROPIONATE 80 MCG/ACT IN AERS: 2.0000 | INHALATION_SPRAY | Freq: Two times a day (BID) | RESPIRATORY_TRACT | Status: DC

## 2014-10-28 MED ORDER — ACETAMINOPHEN 325 MG PO TABS
650.0000 mg | ORAL_TABLET | Freq: Once | ORAL | Status: AC
  Administered 2014-10-28: 650 mg via ORAL
  Filled 2014-10-28: qty 2

## 2014-10-28 MED ORDER — SODIUM BICARBONATE 8.4 % IV SOLN: INTRAVENOUS | Status: DC

## 2014-10-28 MED ORDER — DEXTROSE 5 % IV SOLN
2.0000 g | Freq: Once | INTRAVENOUS | Status: AC
  Administered 2014-10-28: 2 g via INTRAVENOUS
  Filled 2014-10-28: qty 2

## 2014-10-28 MED ORDER — VANCOMYCIN HCL IN DEXTROSE 1-5 GM/200ML-% IV SOLN
1000.0000 mg | Freq: Once | INTRAVENOUS | Status: AC
  Administered 2014-10-28: 1000 mg via INTRAVENOUS
  Filled 2014-10-28: qty 200

## 2014-10-28 MED ORDER — DEXTROSE 5 % IV SOLN
1.0000 g | Freq: Three times a day (TID) | INTRAVENOUS | Status: DC
  Administered 2014-10-28 – 2014-10-31 (×11): 1 g via INTRAVENOUS
  Filled 2014-10-28 (×12): qty 1

## 2014-10-28 MED ORDER — AMITRIPTYLINE HCL 25 MG PO TABS
25.0000 mg | ORAL_TABLET | Freq: Every day | ORAL | Status: DC
  Administered 2014-10-28 – 2014-10-30 (×3): 25 mg via ORAL
  Filled 2014-10-28 (×3): qty 1

## 2014-10-28 MED ORDER — SODIUM CHLORIDE 0.9 % IV BOLUS (SEPSIS)
500.0000 mL | INTRAVENOUS | Status: AC
  Administered 2014-10-28: 500 mL via INTRAVENOUS

## 2014-10-28 NOTE — Care Management Note (Signed)
Case Management Note  Patient Details  Name: [name] MRN: [id number] Date of Birth: 06/22/1955  Subjective/Objective:                 sepsis   Action/Plan:Date: October 28, 2014 Chart reviewed for concurrent status and case management needs. Will continue to follow patient for changes and needs: Velva Harman, RN, BSN, [place]   [phone]   Expected Discharge Date:                  Expected Discharge Plan:     In-House Referral:  NA  Discharge planning Services  CM Consult  Post Acute Care Choice:  NA Choice offered to:  NA  DME Arranged:  N/A DME Agency:  NA  HH Arranged:  NA HH Agency:  NA  Status of Service:  In process, will continue to follow  Medicare Important Message Given:    Date Medicare IM Given:    Medicare IM give by:    Date Additional Medicare IM Given:    Additional Medicare Important Message give by:     If discussed at [place] of Stay Meetings, dates discussed:    Additional Comments:  Leeroy Cha, RN 10/28/2014, 8:52 AM

## 2014-10-28 NOTE — ED Notes (Signed)
Pt presents d/t cough, chest pain, fever, numbness and generalized body aches.

## 2014-10-28 NOTE — ED Provider Notes (Signed)
Medical screening examination/treatment/procedure(s) were conducted as a shared visit with non-physician practitioner(s) and myself.  I personally evaluated the patient during the encounter. Agree with above plan.   Physical Exam  Constitutional: She appears well-developed and well-nourished.  HENT:  Head: Normocephalic.  Neck: Normal range of motion. No JVD.  Cardiovascular: Regular rate and rhythm.  Pulmonary/Chest: Breath sounds normal. She has no wheezes. She exhibits no tenderness. Slightly diminished breath sounds bilaterally.  Abdominal: Soft. Non-tender Musculoskeletal: Normal range of motion.  Neurological: Good DTRs. Skin: Skin is warm and dry.  Nursing note and vitals reviewed.   EKG Interpretation   Date/Time:  Monday October 28 2014 01:00:19 EDT Ventricular Rate:  85 PR Interval:  115 QRS Duration: 81 QT Interval:  403 QTC Calculation: 479 R Axis:   7 Text Interpretation:  Sinus rhythm non specific st t changes Confirmed by  [facility]  MD, Emmaline Kluver ([id number]) on 10/28/2014 2:23:04 AM      I, Timon Geissinger-RASCH,Cleatus Goodin K, personally performed the services described in this documentation. All medical record entries made by the scribe were at my direction and in my presence.  I have reviewed the chart and discharge instructions and agree that the record reflects my personal performance and is accurate and complete. Suanne Minahan-RASCH,Joanna Borawski K.  10/28/2014. 3:13 AM.      Tomaz Janis, MD 10/28/14 [phone]
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
## 2014-10-28 NOTE — ED Notes (Signed)
Below order not completed by EW. 

## 2014-10-28 NOTE — ED Notes (Signed)
Bed: CB76 Expected date:  Expected time:  Means of arrival:  Comments: Hold triage 2

## 2014-10-28 NOTE — Progress Notes (Addendum)
[facility] PROGRESS NOTE    Progress Note   [name] ASN:[id number] DOB: 06-02-1955 DOA: 10/28/2014 PCP: Philis Fendt, MD   Brief Narrative:   [name] is an 59 y.o. female comes in with sore throat and sepsis of unclear source.  Assessment/Plan:  Sepsis ([place]) possibly due to   UTI (lower urinary tract infection) - She was started on the sepsis protocol - Chest x-ray does not show any infiltrate, she is tender on her suprapubic area her UA has 21-50 white blood cells she is febrile with leukocytosis. She relates she's been having suprapubic discomfort for several days and her urine is foul-smelling. - She started empirically on IV vancomycin and Fortaz. She had a temp at the hospital 102.3 - Influenza PCR is pending, blood cultures and urine cultures are pending. - She has no nuchal rigidity and physical exam. Pro-calcitonin elevated. - He had been without her steroids for a week before coming into the hospital started on stress dose steroids.  Acute encephalopathy: - CT of the head showed no acute intracranial findings. Likely due to infectious etiology. - She is on Vanco and Tressie Ellis has no rigidity or photophobia. - Patient is sleepy she relates more than 12 hours since the last time she is cocaine, which could be contributing to her sleepiness if she was hypervigilant before coming to the hospital.  Essential  HTN (hypertension), benign - Continue to hold antihypertensive medications.  Cocaine abuse - Counseling UDS positive for cocaine.  Chest pain - Her chest pain is reproducible by palpation on the axillary line in the second intercostal area - Unlikely cardiac, cardiac enzymes negative 1.  CKD (chronic kidney disease) stage 3, GFR 30-59 ml/min: - Currently at baseline  GERD (gastroesophageal reflux disease): Continued to Protonix.   Sore throat Strep throat negative.    DVT Prophylaxis - Lovenox ordered.  Family Communication:  none Disposition Plan: Home when stable. Code Status:     Code Status Orders        Start     Ordered   10/28/14 0424  Full code   Continuous     10/28/14 0425        IV Access:    Peripheral IV   Procedures and diagnostic studies:   Dg Chest 2 View  10/28/2014  CLINICAL DATA:  Acute onset of cough, generalized chest pain, fever, numbness and body aches. Initial encounter. EXAM: CHEST  2 VIEW COMPARISON:  Chest radiograph performed 09/09/2014 FINDINGS: The lungs are well-aerated and clear. There is no evidence of focal opacification, pleural effusion or pneumothorax. The heart is borderline normal in size. No acute osseous abnormalities are seen. IMPRESSION: No acute cardiopulmonary process seen. Electronically Signed   By: Garald Balding M.D.   On: 10/28/2014 01:42   Ct Head Wo Contrast  10/28/2014  CLINICAL DATA:  Altered mental status and lethargy. Diffuse head pain. EXAM: CT HEAD WITHOUT CONTRAST TECHNIQUE: Contiguous axial images were obtained from the base of the skull through the vertex without intravenous contrast. COMPARISON:  02/28/2014 FINDINGS: No intracranial hemorrhage, mass effect, or midline shift. No hydrocephalus. The basilar cisterns are patent. No evidence of territorial infarct. No intracranial fluid collection. Atherosclerosis of the skullbase vasculature again seen. Calvarium is intact. Included paranasal sinuses and mastoid air cells are well aerated. IMPRESSION: No acute intracranial abnormality. Electronically Signed   By: Jeb Levering M.D.   On: 10/28/2014 03:08     Medical Consultants:    None.  Anti-Infectives:   Anti-infectives  Start     Dose/Rate Route Frequency Ordered Stop   10/28/14 1400  vancomycin (VANCOCIN) IVPB 750 mg/150 ml premix     750 mg 150 mL/hr over 60 Minutes Intravenous Every 12 hours 10/28/14 0613     10/28/14 0800  aztreonam (AZACTAM) 2 g in dextrose 5 % 50 mL IVPB     2 g 100 mL/hr over 30 Minutes Intravenous  Every 8 hours 10/28/14 0528     10/28/14 0100  vancomycin (VANCOCIN) IVPB 1000 mg/200 mL premix     1,000 mg 200 mL/hr over 60 Minutes Intravenous  Once 10/28/14 0049 10/28/14 0306   10/28/14 0100  aztreonam (AZACTAM) 2 g in dextrose 5 % 50 mL IVPB     2 g 100 mL/hr over 30 Minutes Intravenous  Once 10/28/14 0055 10/28/14 0235      Subjective:    [name] she relates she is thirsty, and her joints hurt. She also relates she is hungry. Will like to eat in order to go to sleep.  Objective:    Filed Vitals:   10/28/14 0345 10/28/14 0400 10/28/14 0500 10/28/14 0648  BP:  106/60  125/98  Pulse: 86 68  72  Temp:      TempSrc:      Resp: 21 18  15   Weight:   80.4 kg (177 lb 4 oz)   SpO2: 92% 88%  98%    Intake/Output Summary (Last 24 hours) at 10/28/14 0732 Last data filed at 10/28/14 0700  Gross per 24 hour  Intake 174.58 ml  Output     75 ml  Net  99.58 ml   Filed Weights   10/28/14 0500  Weight: 80.4 kg (177 lb 4 oz)    Exam: Gen:  NAD, somnolent Cardiovascular:  RRR, No M/R/G Chest and lungs:   Good air movement clear to auscultation she has pain reproducible by palpation on the second intercostal space on the interior axillary line Abdomen:  Abdomen soft, with suprapubic tenderness no rebound guarding. Extremities:  No C/E/C   Data Reviewed:    Labs: Basic Metabolic Panel:  Recent Labs Lab 10/28/14 0048 10/28/14 0057 10/28/14 0520  NA 137 139 138  K 3.4* 3.4* 3.3*  CL 109 107 113*  CO2 21*  --  18*  GLUCOSE 107* 106* 97  BUN 17 17 14   CREATININE 1.24* 1.20* 1.09*  CALCIUM 8.5*  --  7.7*   GFR Estimated Creatinine Clearance: 58.7 mL/min (by C-G formula based on Cr of 1.09). Liver Function Tests:  Recent Labs Lab 10/28/14 0048 10/28/14 0520  AST 29 20  ALT 16 14  ALKPHOS 83 71  BILITOT 0.8 0.7  PROT 6.7 5.8*  ALBUMIN 3.3* 2.8*   No results for input(s): LIPASE, AMYLASE in the last 168 hours.  Recent Labs Lab 10/28/14 0520   AMMONIA 26   Coagulation profile  Recent Labs Lab 10/28/14 0520  INR 1.18    CBC:  Recent Labs Lab 10/28/14 0048 10/28/14 0057 10/28/14 0520  WBC 21.4*  --  15.6*  NEUTROABS 17.3*  --  12.2*  HGB 7.2* 18.0* 11.7*  HCT 21.4* 53.0* 35.3*  MCV 90.3  --  89.8  PLT 374  --  230   Cardiac Enzymes:  Recent Labs Lab 10/28/14 0520  TROPONINI <0.03   BNP (last 3 results) No results for input(s): PROBNP in the last 8760 hours. CBG: No results for input(s): GLUCAP in the last 168 hours. D-Dimer: No results for input(s): DDIMER  in the last 72 hours. Hgb A1c: No results for input(s): HGBA1C in the last 72 hours. Lipid Profile: No results for input(s): CHOL, HDL, LDLCALC, TRIG, CHOLHDL, LDLDIRECT in the last 72 hours. Thyroid function studies:  Recent Labs  10/28/14 0520  TSH 0.541   Anemia work up: No results for input(s): VITAMINB12, FOLATE, FERRITIN, TIBC, IRON, RETICCTPCT in the last 72 hours. Sepsis Labs:  Recent Labs Lab 10/28/14 0048 10/28/14 0058 10/28/14 0520  PROCALCITON  --   --  0.76  WBC 21.4*  --  15.6*  LATICACIDVEN  --  0.83 0.9   Microbiology Recent Results (from the past 240 hour(s))  Rapid strep screen (not at [facility])     Status: None   Collection Time: 10/28/14  2:38 AM  Result Value Ref Range Status   Streptococcus, Group A Screen (Direct) NEGATIVE NEGATIVE Final    Comment: (NOTE) A Rapid Antigen test may result negative if the antigen level in the sample is below the detection level of this test. The FDA has not cleared this test as a stand-alone test therefore the rapid antigen negative result has reflexed to a Group A Strep culture.      Medications:   . amitriptyline  25 mg Oral QHS  . aspirin EC  81 mg Oral Daily  . atorvastatin  40 mg Oral q1800  . aztreonam  2 g Intravenous Q8H  . budesonide  0.25 mg Nebulization BID  . pantoprazole (PROTONIX) IV  40 mg Intravenous Q24H  . predniSONE  10 mg Oral Daily  . sodium  chloride  3 mL Intravenous Q12H  . vancomycin  750 mg Intravenous Q12H   Continuous Infusions: . sodium chloride 125 mL/hr at 10/28/14 0541    Time spent: 25 min   LOS: 0 days   Charlynne Cousins  [facility] Pager [phone]  *Please refer to [email or web address], password [email or web address] to get updated schedule on who will round on this patient, as hospitalists switch teams weekly. If 7PM-7AM, please contact night-coverage at [email or web address], password [email or web address] for any overnight needs.  10/28/2014, 7:32 AM
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
## 2014-10-29 LAB — BASIC METABOLIC PANEL
ANION GAP: 7 (ref 5–15)
BUN: 9 mg/dL (ref 6–20)
CALCIUM: 8.2 mg/dL — AB (ref 8.9–10.3)
CO2: 25 mmol/L (ref 22–32)
Chloride: 107 mmol/L (ref 101–111)
Creatinine, Ser: 0.87 mg/dL (ref 0.44–1.00)
GFR calc non Af Amer: >60 mL/min (ref >60)
Glucose, Bld: 105 mg/dL — ABNORMAL HIGH (ref 65–99)
Potassium: 3.4 mmol/L — ABNORMAL LOW (ref 3.5–5.1)
Sodium: 139 mmol/L (ref 135–145)

## 2014-10-29 LAB — URINE CULTURE

## 2014-10-29 MED ORDER — POTASSIUM CHLORIDE CRYS ER 20 MEQ PO TBCR
40.0000 meq | EXTENDED_RELEASE_TABLET | Freq: Two times a day (BID) | ORAL | Status: AC
  Administered 2014-10-29 (×2): 40 meq via ORAL
  Filled 2014-10-29 (×2): qty 2

## 2014-10-29 MED ORDER — DOCUSATE SODIUM 100 MG PO CAPS
100.0000 mg | ORAL_CAPSULE | Freq: Two times a day (BID) | ORAL | Status: DC
  Administered 2014-10-29 – 2014-10-31 (×4): 100 mg via ORAL
  Filled 2014-10-29 (×4): qty 1

## 2014-10-29 MED ORDER — PREDNISONE 5 MG PO TABS
10.0000 mg | ORAL_TABLET | Freq: Every day | ORAL | Status: DC
  Administered 2014-10-29 – 2014-10-31 (×3): 10 mg via ORAL
  Filled 2014-10-29 (×3): qty 2

## 2014-10-29 MED ORDER — AMLODIPINE BESYLATE 5 MG PO TABS
5.0000 mg | ORAL_TABLET | Freq: Every day | ORAL | Status: DC
  Administered 2014-10-29 – 2014-10-31 (×3): 5 mg via ORAL
  Filled 2014-10-29 (×3): qty 1

## 2014-10-29 MED ORDER — ALBUTEROL SULFATE (2.5 MG/3ML) 0.083% IN NEBU: 3.0000 mL | INHALATION_SOLUTION | Freq: Four times a day (QID) | RESPIRATORY_TRACT | Status: DC | PRN

## 2014-10-29 MED ORDER — PANTOPRAZOLE SODIUM 40 MG PO TBEC
40.0000 mg | DELAYED_RELEASE_TABLET | Freq: Every day | ORAL | Status: DC
  Administered 2014-10-30 – 2014-10-31 (×2): 40 mg via ORAL
  Filled 2014-10-29 (×2): qty 1

## 2014-10-29 MED ORDER — BUDESONIDE 0.25 MG/2ML IN SUSP
0.2500 mg | Freq: Two times a day (BID) | RESPIRATORY_TRACT | Status: DC
  Administered 2014-10-30: 0.25 mg via RESPIRATORY_TRACT
  Filled 2014-10-29: qty 2

## 2014-10-29 MED ORDER — OXYBUTYNIN CHLORIDE 5 MG PO TABS
5.0000 mg | ORAL_TABLET | Freq: Two times a day (BID) | ORAL | Status: DC
  Administered 2014-10-29 – 2014-10-31 (×4): 5 mg via ORAL
  Filled 2014-10-29 (×4): qty 1
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
## 2014-10-29 NOTE — Progress Notes (Signed)
Called and gave report to [facility] RN to go to room 1344.  Irven Baltimore, RN

## 2014-10-29 NOTE — Progress Notes (Signed)
  DR:   Valerie Salts CONCERNING: IV to Oral Route Change Policy  RECOMMENDATION: This patient is receiving protonix by the intravenous route.  Based on criteria approved by the Pharmacy and Therapeutics Committee, the intravenous medication(s) is/are being converted to the equivalent oral dose form(s).   DESCRIPTION: These criteria include:  The patient is eating (either orally or via tube) and/or has been taking other orally administered medications for a least 24 hours  The patient has no evidence of active gastrointestinal bleeding or impaired GI absorption (gastrectomy, short bowel, patient on TNA or NPO).  If you have questions about this conversion, please contact the Pharmacy Department  []   [phone] )  [facility] []   [phone] )  [facility] []   [phone] )  [facility] []   [phone] )  [facility] [x]   [phone] )  [place] 10/29/2014, 10:12 AM Pager [phone]

## 2014-10-30 DIAGNOSIS — F1410 Cocaine abuse, uncomplicated: Secondary | ICD-10-CM

## 2014-10-30 DIAGNOSIS — N183 Chronic kidney disease, stage 3 (moderate): Secondary | ICD-10-CM

## 2014-10-30 DIAGNOSIS — N390 Urinary tract infection, site not specified: Secondary | ICD-10-CM

## 2014-10-30 DIAGNOSIS — G9340 Encephalopathy, unspecified: Secondary | ICD-10-CM

## 2014-10-30 DIAGNOSIS — A419 Sepsis, unspecified organism: Principal | ICD-10-CM

## 2014-10-30 LAB — BASIC METABOLIC PANEL
Anion gap: 7 (ref 5–15)
BUN: 18 mg/dL (ref 6–20)
CHLORIDE: 107 mmol/L (ref 101–111)
CO2: 24 mmol/L (ref 22–32)
CREATININE: 1.22 mg/dL — AB (ref 0.44–1.00)
Calcium: 8.5 mg/dL — ABNORMAL LOW (ref 8.9–10.3)
GFR calc non Af Amer: 47 mL/min — ABNORMAL LOW (ref >60)
GFR, EST AFRICAN AMERICAN: 55 mL/min — AB (ref >60)
Glucose, Bld: 167 mg/dL — ABNORMAL HIGH (ref 65–99)
POTASSIUM: 4 mmol/L (ref 3.5–5.1)
SODIUM: 138 mmol/L (ref 135–145)

## 2014-10-30 LAB — CULTURE, GROUP A STREP: STREP A CULTURE: NEGATIVE

## 2014-10-30 MED ORDER — HYDROCODONE-ACETAMINOPHEN 5-325 MG PO TABS
1.0000 | ORAL_TABLET | Freq: Three times a day (TID) | ORAL | Status: DC | PRN
  Administered 2014-10-30 – 2014-10-31 (×3): 1 via ORAL
  Filled 2014-10-30 (×4): qty 1

## 2014-10-30 MED ORDER — DIPHENHYDRAMINE HCL 50 MG/ML IJ SOLN: 25.0000 mg | Freq: Four times a day (QID) | INTRAMUSCULAR | Status: DC | PRN

## 2014-10-30 MED ORDER — DIPHENHYDRAMINE HCL 25 MG PO CAPS
25.0000 mg | ORAL_CAPSULE | Freq: Four times a day (QID) | ORAL | Status: DC | PRN
  Administered 2014-10-30 (×3): 25 mg via ORAL
  Filled 2014-10-30 (×3): qty 1

## 2014-10-30 NOTE — Progress Notes (Signed)
Patient thinks her itchiness is from heparin.text message sent to MD. No hives noted, itching relieved with benadryl po.

## 2014-10-30 NOTE — Progress Notes (Signed)
PROGRESS NOTE  Lynix Bonine PYK:[id number] DOB: 20-Apr-1955 DOA: 10/28/2014 PCP: Philis Fendt, MD  HPI/Recap of past 32 hours: 59 year old female with past history of rheumatoid arthritis, CKD and cocaine abuse admitted on 10/17 for sepsis secondary to urinary tract infection. Patient noted to be confused on admission By following day, patient much improved. Patient started on IV stress dose steroids along with antibiotics. Today she is feeling somewhat better, still somewhat weaker than at baseline  Assessment/Plan: Principal Problem:   Sepsis ([place]) secondary to lower urinary tract infection: Multiple colonies. Blood cultures no growth to date. Given admission only 2 days prior, continue Fortaz for one more day. Patient met criteria for sepsis given leukocytosis, metabolic acidosis initial toxic metabolic encephalopathy, , Acute kidney injury and urinary infection source. Active Problems:   HTN (hypertension), benign: Blood pressure stable   Cocaine abuse: Counseled   Chest pain: Likely musculoskeletal   CKD (chronic kidney disease) stage 3, GFR 30-59 ml/min: At baseline    GERD (gastroesophageal reflux disease)   Sore throat Toxic metabolic encephalopathy: Secondary to sepsis, since resolved    Leukocytosis: Secondary to initial sepsis, white count since resolved. Rheumatoid arthritis: Patient noncompliant with steroids. Initially on IV stress dose and changed her back to baseline by mouth prednisone  Code Status: Full code   Family Communication: Left message with niece   Disposition Plan: Discharge tomorrow   Consultants:  None   Procedures:  None   Antibiotics:  IV vancomycin 10/17-10/18  IV Fortaz 10/17-present    Objective: BP 153/82 mmHg  Pulse 60  Temp(Src) 98.8 F (37.1 C) (Oral)  Resp 18  Ht 5' 6.5" (1.689 m)  Wt 82 kg (180 lb 12.4 oz)  BMI 28.74 kg/m2  SpO2 95%  Intake/Output Summary (Last 24 hours) at 10/30/14 1610 Last data filed at  10/30/14 1439  Gross per 24 hour  Intake   1090 ml  Output      0 ml  Net   1090 ml   Filed Weights   10/28/14 0500 10/29/14 1050  Weight: 80.4 kg (177 lb 4 oz) 82 kg (180 lb 12.4 oz)    Exam:   General:  Alert and oriented 3, no acute distress   Cardiovascular: Regular rate and rhythm, S1-S2   Respiratory: Clear to auscultation bilaterally   Abdomen: Soft, nontender, nondistended, positive bowel sounds   Musculoskeletal: No Clubbing or cyanosis, trace edema   Data Reviewed: Basic Metabolic Panel:  Recent Labs Lab 10/28/14 0048 10/28/14 0057 10/28/14 0520 10/29/14 0951 10/30/14 0421  NA 137 139 138 139 138  K 3.4* 3.4* 3.3* 3.4* 4.0  CL 109 107 113* 107 107  CO2 21*  --  18* 25 24  GLUCOSE 107* 106* 97 105* 167*  BUN 17 17 14 9 18   CREATININE 1.24* 1.20* 1.09* 0.87 1.22*  CALCIUM 8.5*  --  7.7* 8.2* 8.5*   Liver Function Tests:  Recent Labs Lab 10/28/14 0048 10/28/14 0520  AST 29 20  ALT 16 14  ALKPHOS 83 71  BILITOT 0.8 0.7  PROT 6.7 5.8*  ALBUMIN 3.3* 2.8*   No results for input(s): LIPASE, AMYLASE in the last 168 hours.  Recent Labs Lab 10/28/14 0520  AMMONIA 26   CBC:  Recent Labs Lab 10/28/14 0048 10/28/14 0057 10/28/14 0520  WBC 21.4*  --  15.6*  NEUTROABS 17.3*  --  12.2*  HGB 7.2* 18.0* 11.7*  HCT 21.4* 53.0* 35.3*  MCV 90.3  --  89.8  PLT 374  --  230   Cardiac Enzymes:    Recent Labs Lab 10/28/14 0520  TROPONINI <0.03   BNP (last 3 results)  Recent Labs  02/28/14 1646  BNP 22.6    ProBNP (last 3 results) No results for input(s): PROBNP in the last 8760 hours.  CBG: No results for input(s): GLUCAP in the last 168 hours.  Recent Results (from the past 240 hour(s))  Blood Culture (routine x 2)     Status: None (Preliminary result)   Collection Time: 10/28/14  1:15 AM  Result Value Ref Range Status   Specimen Description BLOOD RIGHT ANTECUBITAL  Final   Special Requests BOTTLES DRAWN AEROBIC AND ANAEROBIC  5CC  Final   Culture   Final    NO GROWTH 2 DAYS Performed at [facility]    Report Status PENDING  Incomplete  Blood Culture (routine x 2)     Status: None (Preliminary result)   Collection Time: 10/28/14  1:32 AM  Result Value Ref Range Status   Specimen Description BLOOD RIGHT HAND  Final   Special Requests IN PEDIATRIC BOTTLE 5CC  Final   Culture   Final    NO GROWTH 2 DAYS Performed at [facility]    Report Status PENDING  Incomplete  Urine culture     Status: None   Collection Time: 10/28/14  1:41 AM  Result Value Ref Range Status   Specimen Description URINE, CLEAN CATCH  Final   Special Requests NONE  Final   Culture   Final    MULTIPLE SPECIES PRESENT, SUGGEST RECOLLECTION Performed at [facility]    Report Status 10/29/2014 FINAL  Final  Rapid strep screen (not at [facility])     Status: None   Collection Time: 10/28/14  2:38 AM  Result Value Ref Range Status   Streptococcus, Group A Screen (Direct) NEGATIVE NEGATIVE Final    Comment: (NOTE) A Rapid Antigen test may result negative if the antigen level in the sample is below the detection level of this test. The FDA has not cleared this test as a stand-alone test therefore the rapid antigen negative result has reflexed to a Group A Strep culture.   Culture, Group A Strep     Status: None   Collection Time: 10/28/14  2:38 AM  Result Value Ref Range Status   Strep A Culture Negative  Final    Comment: (NOTE) Performed At: [facility] [place], [place] [id number] Lindon Romp MD WN:[id number]   MRSA PCR Screening     Status: None   Collection Time: 10/28/14  6:01 AM  Result Value Ref Range Status   MRSA by PCR NEGATIVE NEGATIVE Final    Comment:        The GeneXpert MRSA Assay (FDA approved for NASAL specimens only), is one component of a comprehensive MRSA colonization surveillance program. It is not intended to diagnose MRSA infection nor to guide  or monitor treatment for MRSA infections.      Studies: No results found.  Scheduled Meds: . amitriptyline  25 mg Oral QHS  . amLODipine  5 mg Oral Daily  . aspirin EC  81 mg Oral Daily  . atorvastatin  40 mg Oral q1800  . budesonide  0.25 mg Nebulization BID  . budesonide (PULMICORT) nebulizer solution  0.25 mg Nebulization BID  . cefTAZidime (FORTAZ)  IV  1 g Intravenous Q8H  . docusate sodium  100 mg Oral BID  . heparin subcutaneous  5,000 Units  Subcutaneous 3 times per day  . oxybutynin  5 mg Oral BID  . pantoprazole  40 mg Oral Daily  . predniSONE  10 mg Oral Daily  . sodium chloride  3 mL Intravenous Q12H    Continuous Infusions:    Time spent: 15 minutes  [facility] Pager [phone]. If 7PM-7AM, please contact night-coverage at [email or web address], password [facility] 10/30/2014, 4:10 PM  LOS: 2 days

## 2014-10-31 LAB — BASIC METABOLIC PANEL
ANION GAP: 7 (ref 5–15)
BUN: 16 mg/dL (ref 6–20)
CHLORIDE: 104 mmol/L (ref 101–111)
CO2: 28 mmol/L (ref 22–32)
CREATININE: 1.37 mg/dL — AB (ref 0.44–1.00)
Calcium: 8.7 mg/dL — ABNORMAL LOW (ref 8.9–10.3)
GFR calc non Af Amer: 41 mL/min — ABNORMAL LOW (ref >60)
GFR, EST AFRICAN AMERICAN: 48 mL/min — AB (ref >60)
Glucose, Bld: 122 mg/dL — ABNORMAL HIGH (ref 65–99)
POTASSIUM: 3.5 mmol/L (ref 3.5–5.1)
SODIUM: 139 mmol/L (ref 135–145)

## 2014-10-31 LAB — BRAIN NATRIURETIC PEPTIDE: B NATRIURETIC PEPTIDE 5: 252.6 pg/mL — AB (ref 0.0–100.0)

## 2014-10-31 MED ORDER — SODIUM CHLORIDE 0.9 % IV SOLN
INTRAVENOUS | Status: DC
  Administered 2014-10-31: 10:00:00 via INTRAVENOUS

## 2014-10-31 MED ORDER — PANTOPRAZOLE SODIUM 40 MG PO TBEC: 40.0000 mg | DELAYED_RELEASE_TABLET | Freq: Every day | ORAL | Status: DC

## 2014-10-31 MED ORDER — CEFUROXIME AXETIL 500 MG PO TABS: 500.0000 mg | ORAL_TABLET | Freq: Two times a day (BID) | ORAL | Status: AC

## 2014-10-31 MED ORDER — PREDNISONE 10 MG PO TABS: 10.0000 mg | ORAL_TABLET | Freq: Every day | ORAL | Status: DC

## 2014-10-31 NOTE — Progress Notes (Signed)
Nursing Discharge Summary  Patient ID: Tya Haughey MRN: [id number] DOB/AGE: March 31, 1955 59 y.o.  Admit date: 10/28/2014 Discharge date: 10/31/2014  Discharged Condition: good  Disposition: 01-Home or Self Care    Prescriptions Given: Medications called into [facility] on [address].  Patient verbalized understanding of medications without further questions.  Patient home medications returned from [place] of Discharge: Patient to be taken downstairs via wheelchair to be discharged via private vehicle with daugther  Signed: Buel Ream 10/31/2014, 6:48 PM

## 2014-10-31 NOTE — Discharge Summary (Signed)
Discharge Summary  [name] TWS:[id number] DOB: April 17, 1955  PCP: Philis Fendt, MD  Admit date: 10/28/2014 Discharge date: 10/31/2014  Time spent: 25 minutes  Recommendations for Outpatient Follow-up:  1. New medication: Ceftin 500 mg by mouth twice a day 6 more days 2. Patient will follow-up with  her PCP as scheduled in the next month  Discharge Diagnoses:  Active Hospital Problems   Diagnosis Date Noted  . Sepsis ([place]) 10/28/2014  . Sore throat 10/28/2014  . Acute encephalopathy 10/28/2014  . Metabolic acidosis   . Leukocytosis   . GERD (gastroesophageal reflux disease) 09/09/2014  . CKD (chronic kidney disease) stage 3, GFR 30-59 ml/min 08/20/2014  . UTI (lower urinary tract infection) 08/20/2014  . Chest pain 08/20/2014  . Cocaine abuse 02/11/2013  . HTN (hypertension), benign 10/08/2012    Resolved Hospital Problems   Diagnosis Date Noted Date Resolved  No resolved problems to display.    Discharge Condition: Improved, being discharged home  Diet recommendation: Low-sodium  Filed Weights   10/28/14 0500 10/29/14 1050 10/31/14 0017  Weight: 80.4 kg (177 lb 4 oz) 82 kg (180 lb 12.4 oz) 81.9 kg (180 lb 8.9 oz)    History of present illness:  59 year old female with past history of rheumatoid arthritis, CKD and cocaine abuse admitted on 10/17 for sepsis secondary to urinary tract infection. Patient noted to be confused on admission  Hospital Course:  Principal Problem:   Sepsis ([place]) secondary to lower urinary tract infection: Urine cultures growing multiple colonies and blood cultures no growth to date. In review of previous cultures, she grabbed pansensitive Escherichia coli. She put on IV Fortaz. Patient met criteria for sepsis on admission given leukocytosis, metabolic acidosis and initial toxic metabolic encephalopathy plus he acute kidney injury with urine infection source. By discharge, patient waited 4 days and discharged on 6 more days of by mouth  Ceftin Active Problems:   HTN (hypertension), benign: Stable, continue on home meds   Cocaine abuse: Counseled   Chest pain: Troponins negative. Likely muscular skeletal   Acute kidney injury in the setting of CKD (chronic kidney disease) stage 3, GFR 30-59 ml/min: At baseline following IV fluids    Sore throat   Acute encephalopathy: Toxic metabolic encephalopathy secondary to sepsis, since resolved   Metabolic acidosis  Rheumatoid arthritis: Patient noncompliant with steroids. Initially on IV stress dose steroids and changed back to her baseline home dose  Procedures:  None  Consultations:  None  Discharge Exam: BP 153/87 mmHg  Pulse 81  Temp(Src) 98.3 F (36.8 C) (Oral)  Resp 18  Ht 5' 6.5" (1.689 m)  Wt 81.9 kg (180 lb 8.9 oz)  BMI 28.71 kg/m2  SpO2 98%  General: Alert and oriented 3 Cardiovascular: Regular rate and rhythm, S1-S2 Respiratory: Clear to auscultation bilaterally  Discharge Instructions You were cared for by a hospitalist during your hospital stay. If you have any questions about your discharge medications or the care you received while you were in the hospital after you are discharged, you can call the unit and asked to speak with the hospitalist on call if the hospitalist that took care of you is not available. Once you are discharged, your primary care physician will handle any further medical issues. Please note that NO REFILLS for any discharge medications will be authorized once you are discharged, as it is imperative that you return to your primary care physician (or establish a relationship with a primary care physician if you do not have one) for  your aftercare needs so that they can reassess your need for medications and monitor your lab values.  Discharge Instructions    Diet - low sodium heart healthy    Complete by:  As directed      Increase activity slowly    Complete by:  As directed             Medication List    STOP taking these  medications        nitroGLYCERIN 0.4 mg/hr patch  Commonly known as:  NITRODUR - Dosed in mg/24 hr     ondansetron 4 MG tablet  Commonly known as:  ZOFRAN      TAKE these medications        albuterol 108 (90 BASE) MCG/ACT inhaler  Commonly known as:  PROVENTIL HFA;VENTOLIN HFA  Inhale 2 puffs into the lungs every 6 (six) hours as needed for wheezing or shortness of breath.     amitriptyline 25 MG tablet  Commonly known as:  ELAVIL  Take 25 mg by mouth at bedtime.     amLODipine 5 MG tablet  Commonly known as:  NORVASC  Take 5 mg by mouth daily.     aspirin EC 81 MG tablet  Take 81 mg by mouth daily.     atorvastatin 40 MG tablet  Commonly known as:  LIPITOR  Take 1 tablet (40 mg total) by mouth daily at 6 PM.     beclomethasone 80 MCG/ACT inhaler  Commonly known as:  QVAR  Inhale 2 puffs into the lungs 2 (two) times daily.     cefUROXime 500 MG tablet  Commonly known as:  CEFTIN  Take 1 tablet (500 mg total) by mouth 2 (two) times daily with a meal.     docusate sodium 100 MG capsule  Commonly known as:  COLACE  Take 100 mg by mouth 2 (two) times daily.     ibuprofen 200 MG tablet  Commonly known as:  ADVIL,MOTRIN  Take 800 mg by mouth every 6 (six) hours as needed for moderate pain.     lisinopril 20 MG tablet  Commonly known as:  PRINIVIL,ZESTRIL  Take 20 mg by mouth daily.     OVER THE COUNTER MEDICATION  Apply 1 application topically daily as needed (rash on buttocks from pads). Sensi Care Protective Barrier (15% zinc oxide, 49% petrolatum)     oxybutynin 5 MG tablet  Commonly known as:  DITROPAN  Take 5 mg by mouth 2 (two) times daily.     oxyCODONE-acetaminophen 5-325 MG tablet  Commonly known as:  PERCOCET/ROXICET  Take 1 tablet by mouth every 8 (eight) hours as needed for severe pain.     pantoprazole 40 MG tablet  Commonly known as:  PROTONIX  Take 1 tablet (40 mg total) by mouth daily.     predniSONE 10 MG tablet  Commonly known as:   DELTASONE  Take 1 tablet (10 mg total) by mouth daily. Continuous course       Allergies  Allergen Reactions  . Iohexol Anaphylaxis, Swelling and Other (See Comments)      Throat swelling    . 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 Levofloxacin In D5w Other (See Comments)    Has baseline prolonged QTc.  Avoid QTc prolonging medications.  . Penicillins Itching  . 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  Zofran [Ondansetron Hcl] Other (See Comments)    Has baseline prolonged QTc.  Avoid QTc prolonging medications.      The results of significant diagnostics from this hospitalization (including imaging, microbiology, ancillary and laboratory) are listed below for reference.    Significant Diagnostic Studies: Dg Chest 2 View  10/28/2014  CLINICAL DATA:  Acute onset of cough, generalized chest pain, fever, numbness and body aches. Initial encounter. EXAM: CHEST  2 VIEW COMPARISON:  Chest radiograph performed 09/09/2014 FINDINGS: The lungs are well-aerated and clear. There is no evidence of focal opacification, pleural effusion or pneumothorax. The heart is borderline normal in size. No acute osseous abnormalities are seen. IMPRESSION: No acute cardiopulmonary process seen. Electronically Signed   By: Garald Balding M.D.   On: 10/28/2014 01:42   Ct Head Wo Contrast  10/28/2014  CLINICAL DATA:  Altered mental status and lethargy. Diffuse head pain. EXAM: CT HEAD WITHOUT CONTRAST TECHNIQUE: Contiguous axial images were obtained from the base of the skull through the vertex without intravenous contrast. COMPARISON:  02/28/2014 FINDINGS: No intracranial hemorrhage, mass effect, or midline shift. No hydrocephalus. The basilar cisterns are patent. No evidence of territorial infarct. No intracranial fluid collection. Atherosclerosis of the skullbase vasculature again seen.  Calvarium is intact. Included paranasal sinuses and mastoid air cells are well aerated. IMPRESSION: No acute intracranial abnormality. Electronically Signed   By: Jeb Levering M.D.   On: 10/28/2014 03:08    Microbiology: Recent Results (from the past 240 hour(s))  Blood Culture (routine x 2)     Status: None (Preliminary result)   Collection Time: 10/28/14  1:15 AM  Result Value Ref Range Status   Specimen Description BLOOD RIGHT ANTECUBITAL  Final   Special Requests BOTTLES DRAWN AEROBIC AND ANAEROBIC 5CC  Final   Culture   Final    NO GROWTH 3 DAYS Performed at [facility]    Report Status PENDING  Incomplete  Blood Culture (routine x 2)     Status: None (Preliminary result)   Collection Time: 10/28/14  1:32 AM  Result Value Ref Range Status   Specimen Description BLOOD RIGHT HAND  Final   Special Requests IN PEDIATRIC BOTTLE 5CC  Final   Culture   Final    NO GROWTH 3 DAYS Performed at [facility]    Report Status PENDING  Incomplete  Urine culture     Status: None   Collection Time: 10/28/14  1:41 AM  Result Value Ref Range Status   Specimen Description URINE, CLEAN CATCH  Final   Special Requests NONE  Final   Culture   Final    MULTIPLE SPECIES PRESENT, SUGGEST RECOLLECTION Performed at [facility]    Report Status 10/29/2014 FINAL  Final  Rapid strep screen (not at [facility])     Status: None   Collection Time: 10/28/14  2:38 AM  Result Value Ref Range Status   Streptococcus, Group A Screen (Direct) NEGATIVE NEGATIVE Final    Comment: (NOTE) A Rapid Antigen test may result negative if the antigen level in the sample is below the detection level of this test. The FDA has not cleared this test as a stand-alone test therefore the rapid antigen negative result has reflexed to a Group A Strep culture.   Culture, Group A Strep     Status: None   Collection Time: 10/28/14  2:38 AM  Result Value Ref Range Status   Strep A Culture Negative  Final      Comment: (  NOTE) Performed At: [facility] [place], [place] [id number] Lindon Romp MD [id number]   MRSA PCR Screening     Status: None   Collection Time: 10/28/14  6:01 AM  Result Value Ref Range Status   MRSA by PCR NEGATIVE NEGATIVE Final    Comment:        The GeneXpert MRSA Assay (FDA approved for NASAL specimens only), is one component of a comprehensive MRSA colonization surveillance program. It is not intended to diagnose MRSA infection nor to guide or monitor treatment for MRSA infections.      Labs: Basic Metabolic Panel:  Recent Labs Lab 10/28/14 0048 10/28/14 0057 10/28/14 0520 10/29/14 0951 10/30/14 0421 10/31/14 0452  NA 137 139 138 139 138 139  K 3.4* 3.4* 3.3* 3.4* 4.0 3.5  CL 109 107 113* 107 107 104  CO2 21*  --  18* 25 24 28   GLUCOSE 107* 106* 97 105* 167* 122*  BUN 17 17 14 9 18 16   CREATININE 1.24* 1.20* 1.09* 0.87 1.22* 1.37*  CALCIUM 8.5*  --  7.7* 8.2* 8.5* 8.7*   Liver Function Tests:  Recent Labs Lab 10/28/14 0048 10/28/14 0520  AST 29 20  ALT 16 14  ALKPHOS 83 71  BILITOT 0.8 0.7  PROT 6.7 5.8*  ALBUMIN 3.3* 2.8*   No results for input(s): LIPASE, AMYLASE in the last 168 hours.  Recent Labs Lab 10/28/14 0520  AMMONIA 26   CBC:  Recent Labs Lab 10/28/14 0048 10/28/14 0057 10/28/14 0520  WBC 21.4*  --  15.6*  NEUTROABS 17.3*  --  12.2*  HGB 7.2* 18.0* 11.7*  HCT 21.4* 53.0* 35.3*  MCV 90.3  --  89.8  PLT 374  --  230   Cardiac Enzymes:  Recent Labs Lab 10/28/14 0520  TROPONINI <0.03   BNP: BNP (last 3 results)  Recent Labs  02/28/14 1646 10/31/14 1012  BNP 22.6 252.6*    ProBNP (last 3 results) No results for input(s): PROBNP in the last 8760 hours.  CBG: No results for input(s): GLUCAP in the last 168 hours.     Signed:  [name]  [facility] 10/31/2014, 4:10 PM

## 2014-10-31 NOTE — Evaluation (Signed)
Physical Therapy Evaluation Patient Details Name: [name] MRN: [id number] DOB: [birth year] Today's Date: 10/31/2014   History of Present Illness  59 year old female with past history of rheumatoid arthritis, CKD and cocaine abuse admitted on 10/17 for sepsis secondary to urinary tract infection. Patient noted to be confused on admission  Patient started on IV stress dose steroids along with antibiotics.   Clinical Impression  Patient is modified independent with mobility, up ad lib. Reports ambulating in the hall previous day. Sats 98% on RA after ambulation. No further PT indicated.    Follow Up Recommendations No PT follow up    Equipment Recommendations  None recommended by PT    Recommendations for Other Services       Precautions / Restrictions Precautions Precautions: None      Mobility  Bed Mobility Overal bed mobility: Independent                Transfers Overall transfer level: Independent                  Ambulation/Gait Ambulation/Gait assistance: Modified independent (Device/Increase time) Ambulation Distance (Feet): 420 Feet   Gait Pattern/deviations: Step-through pattern;Antalgic     General Gait Details: pushed IV pole as  cane not available  Stairs            Wheelchair Mobility    Modified Rankin (Stroke Patients Only)       Balance Overall balance assessment: Independent                                           Pertinent Vitals/Pain Pain Assessment: Faces Faces Pain Scale: Hurts little more Pain Location: knees Pain Descriptors / Indicators: Discomfort Pain Intervention(s): Monitored during session    Home Living Family/patient expects to be discharged to:: Private residence Living Arrangements: Other relatives   Type of Home: Apartment Home Access: Stairs to enter;Level entry     Home Layout: One level Home Equipment: Cane - single point      Prior Function Level of Independence:  Independent with assistive device(s)               Hand Dominance        Extremity/Trunk Assessment   Upper Extremity Assessment: Overall WFL for tasks assessed           Lower Extremity Assessment: Overall WFL for tasks assessed         Communication   Communication: No difficulties  Cognition Arousal/Alertness: Lethargic Behavior During Therapy: WFL for tasks assessed/performed Overall Cognitive Status: Within Functional Limits for tasks assessed                      General Comments      Exercises        Assessment/Plan    PT Assessment Patent does not need any further PT services  PT Diagnosis Difficulty walking   PT Problem List    PT Treatment Interventions     PT Goals (Current goals can be found in the Care Plan section) Acute Rehab PT Goals PT Goal Formulation: All assessment and education complete, DC therapy    Frequency     Barriers to discharge        Co-evaluation               End of Session   Activity Tolerance: Patient tolerated treatment well  Patient left: in bed;with call bell/phone within reach Nurse Communication: Mobility status         Time: 0900-0912 PT Time Calculation (min) (ACUTE ONLY): 12 min   Charges:   PT Evaluation $Initial PT Evaluation Tier I: 1 Procedure     PT G CodesClaretha Cooper 10/31/2014, 9:17 AM Tresa Endo PT [phone]

## 2014-10-31 NOTE — Progress Notes (Signed)
PT Cancellation Note  Patient Details Name: [name] MRN: [id number] DOB: 1955/04/15   Cancelled Treatment:    Reason Eval/Treat Not Completed: Patient declined,  (states that she needs to rest right now. Encouraged patient to  paticipate now. states she will later. will return this AM and ambulate with patient .)   Claretha Cooper 10/31/2014, 8:21 AM Tresa Endo PT [phone]

## 2014-11-02 LAB — CULTURE, BLOOD (ROUTINE X 2)
CULTURE: NO GROWTH
CULTURE: NO GROWTH

## 2014-12-08 IMAGING — CR DG CHEST 2V
2 series · 2 of 2 positions shown · non-contrast
Comparison: 02/14/2013 and 10/26/2012 radiographs.

CLINICAL DATA: Cough and congestion. Sore throat with chest
tightness. History of hypertension and smoking.

EXAM:
CHEST  2 VIEW

[w chest lat]
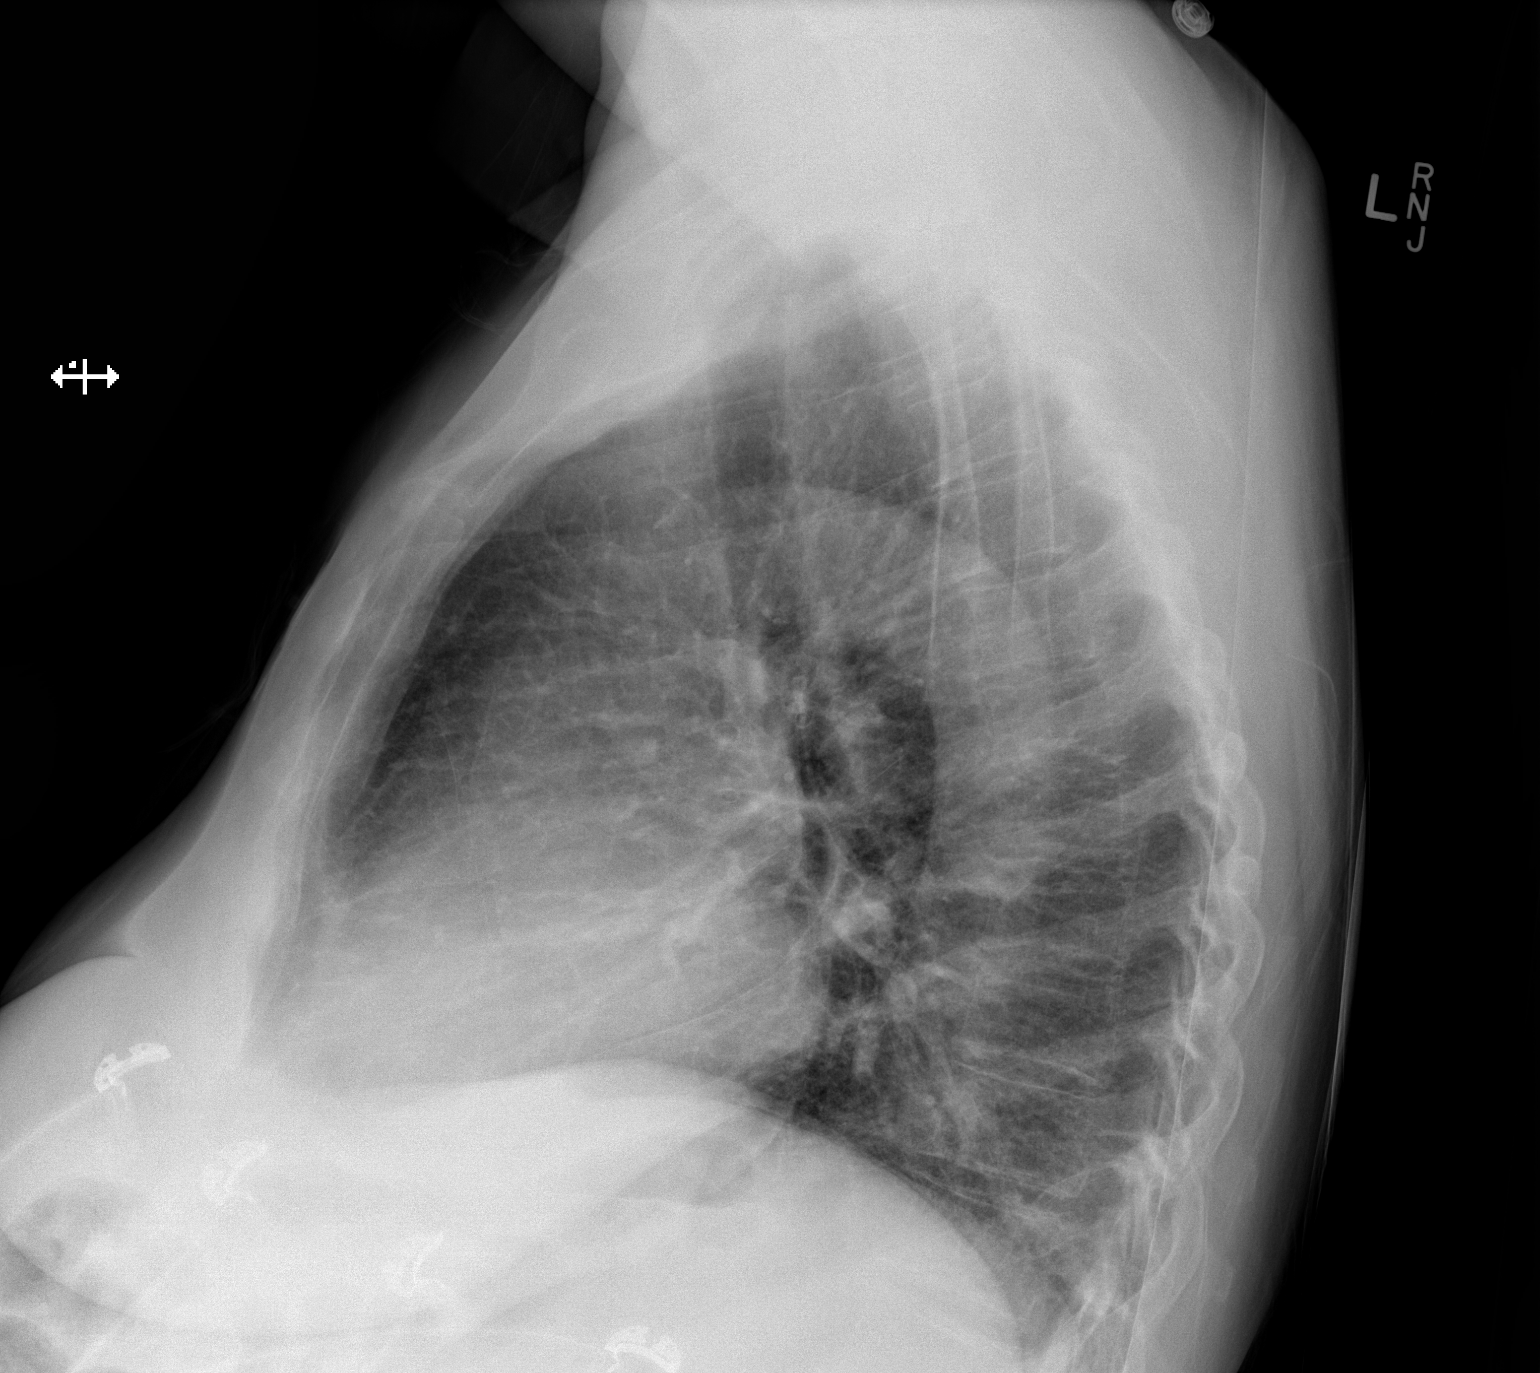

[x chest ap]
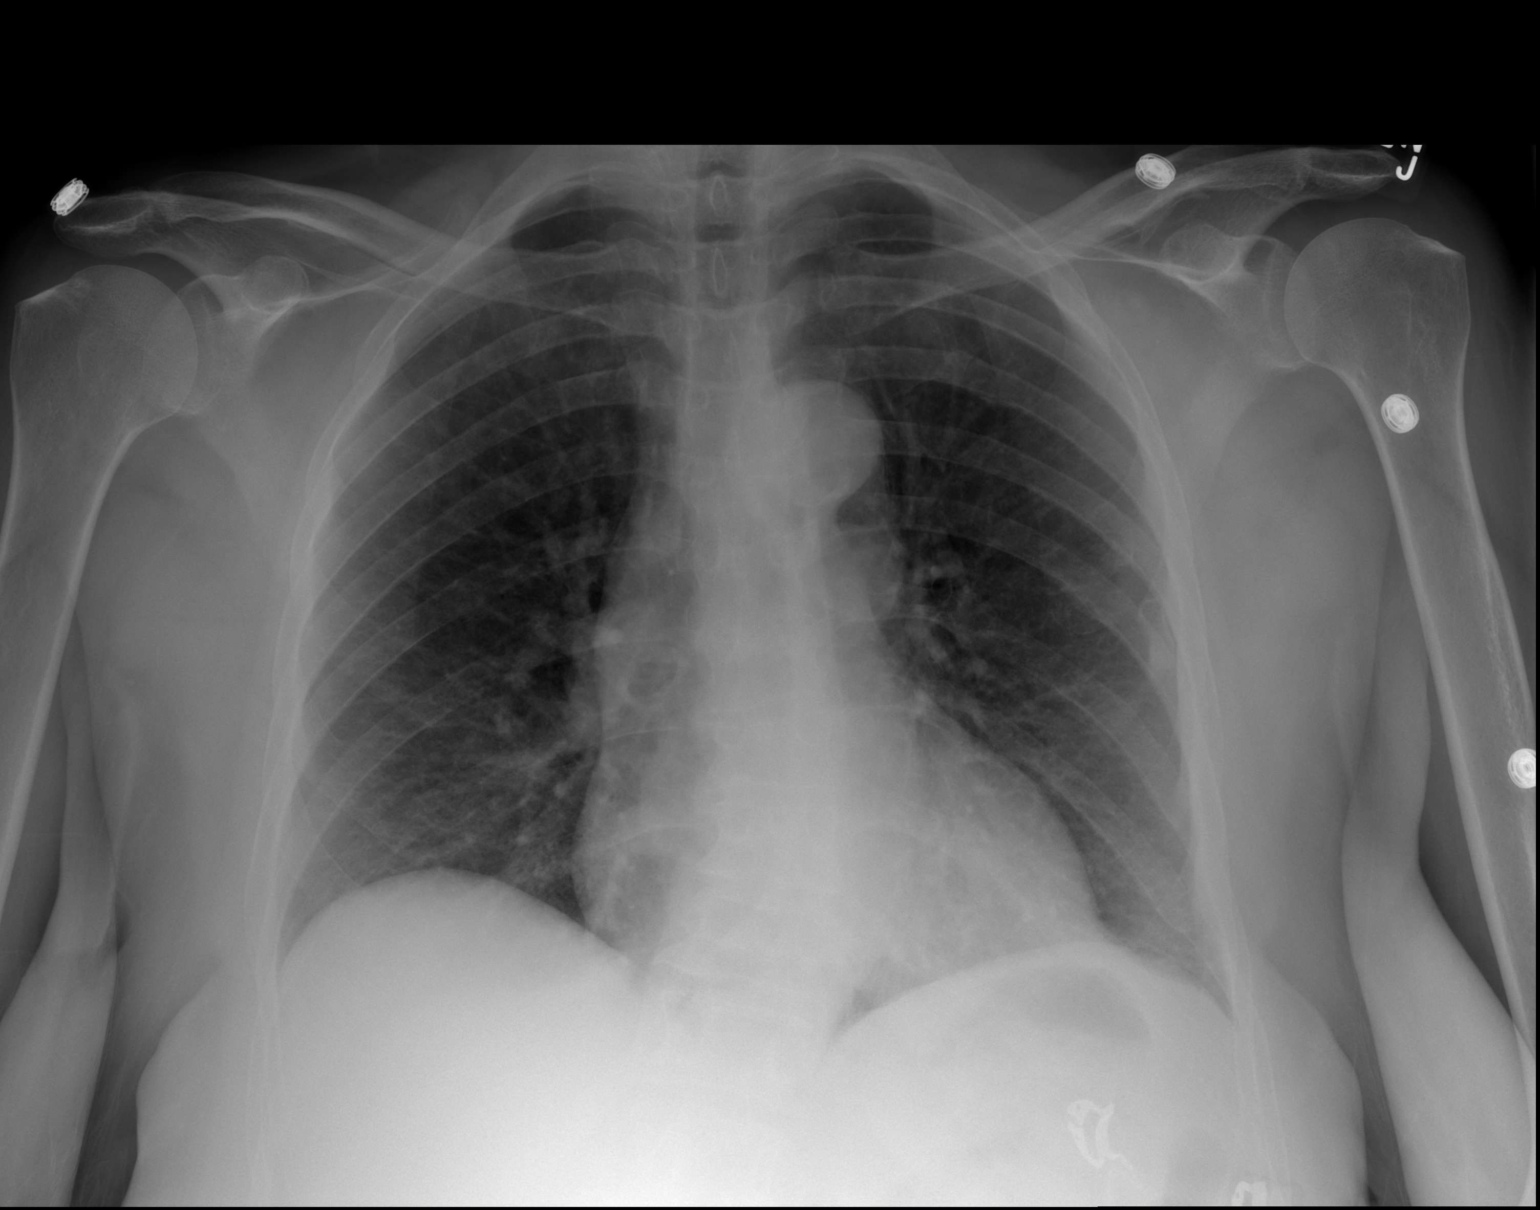

[2 of 2 positions shown; findings below may reference images not displayed]

FINDINGS: The heart size and mediastinal contours are stable. There is stable
aortic atherosclerosis. The lungs are clear. Old rib fracture is
noted on the left. There is a mild scoliosis. No acute osseous
findings are evident.
IMPRESSION: No active cardiopulmonary process.

## 2014-12-30 ENCOUNTER — Other Ambulatory Visit: Payer: Self-pay

## 2014-12-30 DIAGNOSIS — Z1231 Encounter for screening mammogram for malignant neoplasm of breast: Secondary | ICD-10-CM

## 2015-01-17 ENCOUNTER — Ambulatory Visit
Admission: RE | Admit: 2015-01-17 | Discharge: 2015-01-17 | Disposition: A | Discharge status: Home / Self Care | Payer: Medicaid Other | Source: Ambulatory Visit

## 2015-01-17 DIAGNOSIS — Z1231 Encounter for screening mammogram for malignant neoplasm of breast: Secondary | ICD-10-CM

## 2015-02-14 ENCOUNTER — Inpatient Hospital Stay (HOSPITAL_COMMUNITY): Payer: Medicaid Other

## 2015-02-14 ENCOUNTER — Emergency Department (HOSPITAL_COMMUNITY): Payer: Medicaid Other

## 2015-02-14 ENCOUNTER — Encounter (HOSPITAL_COMMUNITY): Payer: Self-pay | Admitting: Emergency Medicine

## 2015-02-14 ENCOUNTER — Inpatient Hospital Stay (HOSPITAL_COMMUNITY)
Admission: EM | Admit: 2015-02-14 | Discharge: 2015-02-20 | DRG: 190 | Disposition: A | Discharge status: Home / Self Care | Payer: Medicaid Other | Attending: Internal Medicine | Admitting: Internal Medicine

## 2015-02-14 DIAGNOSIS — R1110 Vomiting, unspecified: Secondary | ICD-10-CM

## 2015-02-14 DIAGNOSIS — M069 Rheumatoid arthritis, unspecified: Secondary | ICD-10-CM | POA: Diagnosis present

## 2015-02-14 DIAGNOSIS — F1410 Cocaine abuse, uncomplicated: Secondary | ICD-10-CM | POA: Diagnosis present

## 2015-02-14 DIAGNOSIS — E860 Dehydration: Secondary | ICD-10-CM | POA: Diagnosis present

## 2015-02-14 DIAGNOSIS — A419 Sepsis, unspecified organism: Secondary | ICD-10-CM

## 2015-02-14 DIAGNOSIS — F1419 Cocaine abuse with unspecified cocaine-induced disorder: Secondary | ICD-10-CM

## 2015-02-14 DIAGNOSIS — Z90710 Acquired absence of both cervix and uterus: Secondary | ICD-10-CM

## 2015-02-14 DIAGNOSIS — Z79891 Long term (current) use of opiate analgesic: Secondary | ICD-10-CM

## 2015-02-14 DIAGNOSIS — E785 Hyperlipidemia, unspecified: Secondary | ICD-10-CM | POA: Diagnosis present

## 2015-02-14 DIAGNOSIS — K219 Gastro-esophageal reflux disease without esophagitis: Secondary | ICD-10-CM | POA: Diagnosis present

## 2015-02-14 DIAGNOSIS — F17210 Nicotine dependence, cigarettes, uncomplicated: Secondary | ICD-10-CM | POA: Diagnosis present

## 2015-02-14 DIAGNOSIS — Z79899 Other long term (current) drug therapy: Secondary | ICD-10-CM | POA: Diagnosis not present

## 2015-02-14 DIAGNOSIS — Z7952 Long term (current) use of systemic steroids: Secondary | ICD-10-CM | POA: Diagnosis not present

## 2015-02-14 DIAGNOSIS — K5900 Constipation, unspecified: Secondary | ICD-10-CM | POA: Diagnosis present

## 2015-02-14 DIAGNOSIS — N179 Acute kidney failure, unspecified: Secondary | ICD-10-CM | POA: Diagnosis present

## 2015-02-14 DIAGNOSIS — N390 Urinary tract infection, site not specified: Secondary | ICD-10-CM | POA: Diagnosis present

## 2015-02-14 DIAGNOSIS — J189 Pneumonia, unspecified organism: Secondary | ICD-10-CM | POA: Diagnosis present

## 2015-02-14 DIAGNOSIS — R112 Nausea with vomiting, unspecified: Secondary | ICD-10-CM | POA: Diagnosis present

## 2015-02-14 DIAGNOSIS — Z7951 Long term (current) use of inhaled steroids: Secondary | ICD-10-CM | POA: Diagnosis not present

## 2015-02-14 DIAGNOSIS — Z7982 Long term (current) use of aspirin: Secondary | ICD-10-CM | POA: Diagnosis not present

## 2015-02-14 DIAGNOSIS — J440 Chronic obstructive pulmonary disease with acute lower respiratory infection: Secondary | ICD-10-CM | POA: Diagnosis present

## 2015-02-14 DIAGNOSIS — R4182 Altered mental status, unspecified: Secondary | ICD-10-CM

## 2015-02-14 DIAGNOSIS — G9341 Metabolic encephalopathy: Secondary | ICD-10-CM | POA: Diagnosis not present

## 2015-02-14 DIAGNOSIS — R079 Chest pain, unspecified: Secondary | ICD-10-CM

## 2015-02-14 DIAGNOSIS — N3000 Acute cystitis without hematuria: Secondary | ICD-10-CM

## 2015-02-14 DIAGNOSIS — Z825 Family history of asthma and other chronic lower respiratory diseases: Secondary | ICD-10-CM

## 2015-02-14 DIAGNOSIS — G9340 Encephalopathy, unspecified: Secondary | ICD-10-CM

## 2015-02-14 DIAGNOSIS — I10 Essential (primary) hypertension: Secondary | ICD-10-CM | POA: Diagnosis present

## 2015-02-14 DIAGNOSIS — J449 Chronic obstructive pulmonary disease, unspecified: Secondary | ICD-10-CM | POA: Diagnosis present

## 2015-02-14 DIAGNOSIS — Z8249 Family history of ischemic heart disease and other diseases of the circulatory system: Secondary | ICD-10-CM | POA: Diagnosis not present

## 2015-02-14 DIAGNOSIS — R109 Unspecified abdominal pain: Secondary | ICD-10-CM

## 2015-02-14 HISTORY — DX: Cocaine abuse with unspecified cocaine-induced disorder: F14.19

## 2015-02-14 LAB — COMPREHENSIVE METABOLIC PANEL
ALT: 15 U/L (ref 14–54)
AST: 18 U/L (ref 15–41)
Albumin: 3.8 g/dL (ref 3.5–5.0)
Alkaline Phosphatase: 100 U/L (ref 38–126)
Anion gap: 13 (ref 5–15)
BUN: 29 mg/dL — ABNORMAL HIGH (ref 6–20)
CHLORIDE: 105 mmol/L (ref 101–111)
CO2: 22 mmol/L (ref 22–32)
Calcium: 9 mg/dL (ref 8.9–10.3)
Creatinine, Ser: 1.47 mg/dL — ABNORMAL HIGH (ref 0.44–1.00)
GFR, EST AFRICAN AMERICAN: 43 mL/min — AB (ref >60)
GFR, EST NON AFRICAN AMERICAN: 37 mL/min — AB (ref >60)
Glucose, Bld: 83 mg/dL (ref 65–99)
POTASSIUM: 3.9 mmol/L (ref 3.5–5.1)
SODIUM: 140 mmol/L (ref 135–145)
Total Bilirubin: 0.7 mg/dL (ref 0.3–1.2)
Total Protein: 8.3 g/dL — ABNORMAL HIGH (ref 6.5–8.1)

## 2015-02-14 LAB — URINALYSIS, ROUTINE W REFLEX MICROSCOPIC
Bilirubin Urine: NEGATIVE
GLUCOSE, UA: NEGATIVE mg/dL
KETONES UR: NEGATIVE mg/dL
NITRITE: POSITIVE — AB
PROTEIN: NEGATIVE mg/dL
SPECIFIC GRAVITY, URINE: 1.018 (ref 1.005–1.030)
pH: 6 (ref 5.0–8.0)

## 2015-02-14 LAB — PROTIME-INR
INR: 1.08 (ref 0.00–1.49)
Prothrombin Time: 14.2 seconds (ref 11.6–15.2)

## 2015-02-14 LAB — RAPID URINE DRUG SCREEN, HOSP PERFORMED
Amphetamines: NOT DETECTED
BENZODIAZEPINES: NOT DETECTED
Barbiturates: NOT DETECTED
COCAINE: POSITIVE — AB
OPIATES: NOT DETECTED
Tetrahydrocannabinol: NOT DETECTED

## 2015-02-14 LAB — CBC WITH DIFFERENTIAL/PLATELET
Basophils Absolute: 0 10*3/uL (ref 0.0–0.1)
Basophils Relative: 0 %
EOS ABS: 0.1 10*3/uL (ref 0.0–0.7)
Eosinophils Relative: 1 %
HEMATOCRIT: 45.4 % (ref 36.0–46.0)
HEMOGLOBIN: 14.9 g/dL (ref 12.0–15.0)
LYMPHS ABS: 2.7 10*3/uL (ref 0.7–4.0)
Lymphocytes Relative: 25 %
MCH: 29.4 pg (ref 26.0–34.0)
MCHC: 32.8 g/dL (ref 30.0–36.0)
MCV: 89.7 fL (ref 78.0–100.0)
MONOS PCT: 6 %
Monocytes Absolute: 0.6 10*3/uL (ref 0.1–1.0)
NEUTROS ABS: 7.4 10*3/uL (ref 1.7–7.7)
NEUTROS PCT: 68 %
Platelets: 352 10*3/uL (ref 150–400)
RBC: 5.06 MIL/uL (ref 3.87–5.11)
RDW: 14.4 % (ref 11.5–15.5)
WBC: 10.9 10*3/uL — ABNORMAL HIGH (ref 4.0–10.5)

## 2015-02-14 LAB — MRSA PCR SCREENING: MRSA by PCR: NEGATIVE

## 2015-02-14 LAB — APTT: APTT: 30 s (ref 24–37)

## 2015-02-14 LAB — I-STAT TROPONIN, ED: Troponin i, poc: 0.01 ng/mL (ref 0.00–0.08)

## 2015-02-14 LAB — I-STAT CG4 LACTIC ACID, ED
Lactic Acid, Venous: 1.68 mmol/L (ref 0.5–2.0)
Lactic Acid, Venous: 1.75 mmol/L (ref 0.5–2.0)

## 2015-02-14 LAB — URINE MICROSCOPIC-ADD ON

## 2015-02-14 LAB — MAGNESIUM: Magnesium: 2 mg/dL (ref 1.7–2.4)

## 2015-02-14 LAB — PROCALCITONIN

## 2015-02-14 LAB — PHOSPHORUS: Phosphorus: 3.2 mg/dL (ref 2.5–4.6)

## 2015-02-14 LAB — LIPASE, BLOOD: LIPASE: 25 U/L (ref 11–51)

## 2015-02-14 MED ORDER — PROMETHAZINE HCL 25 MG PO TABS
12.5000 mg | ORAL_TABLET | Freq: Four times a day (QID) | ORAL | Status: DC | PRN
  Administered 2015-02-16 – 2015-02-17 (×2): 12.5 mg via ORAL
  Filled 2015-02-14 (×2): qty 1

## 2015-02-14 MED ORDER — DOCUSATE SODIUM 100 MG PO CAPS
100.0000 mg | ORAL_CAPSULE | Freq: Two times a day (BID) | ORAL | Status: DC | PRN
  Administered 2015-02-18: 100 mg via ORAL
  Filled 2015-02-14: qty 1

## 2015-02-14 MED ORDER — ATORVASTATIN CALCIUM 40 MG PO TABS
40.0000 mg | ORAL_TABLET | Freq: Every day | ORAL | Status: DC
  Administered 2015-02-14 – 2015-02-17 (×4): 40 mg via ORAL
  Filled 2015-02-14 (×5): qty 1

## 2015-02-14 MED ORDER — ASPIRIN EC 81 MG PO TBEC
81.0000 mg | DELAYED_RELEASE_TABLET | Freq: Every day | ORAL | Status: DC
  Administered 2015-02-14 – 2015-02-20 (×7): 81 mg via ORAL
  Filled 2015-02-14 (×7): qty 1

## 2015-02-14 MED ORDER — DIPHENHYDRAMINE HCL 25 MG PO CAPS
25.0000 mg | ORAL_CAPSULE | Freq: Once | ORAL | Status: AC
  Administered 2015-02-14: 25 mg via ORAL
  Filled 2015-02-14: qty 1

## 2015-02-14 MED ORDER — HYDROMORPHONE HCL 1 MG/ML IJ SOLN: 1.0000 mg | INTRAMUSCULAR | Status: DC | PRN

## 2015-02-14 MED ORDER — LORAZEPAM 2 MG/ML IJ SOLN
1.0000 mg | Freq: Once | INTRAMUSCULAR | Status: AC
  Administered 2015-02-14: 1 mg via INTRAVENOUS
  Filled 2015-02-14: qty 1

## 2015-02-14 MED ORDER — AMLODIPINE BESYLATE 5 MG PO TABS
5.0000 mg | ORAL_TABLET | Freq: Every day | ORAL | Status: DC
  Administered 2015-02-14 – 2015-02-20 (×7): 5 mg via ORAL
  Filled 2015-02-14 (×7): qty 1

## 2015-02-14 MED ORDER — DEXTROSE 5 % IV SOLN
1.0000 g | INTRAVENOUS | Status: DC
  Administered 2015-02-14 – 2015-02-15 (×2): 1 g via INTRAVENOUS
  Filled 2015-02-14 (×3): qty 10

## 2015-02-14 MED ORDER — BECLOMETHASONE DIPROPIONATE 80 MCG/ACT IN AERS: 2.0000 | INHALATION_SPRAY | Freq: Two times a day (BID) | RESPIRATORY_TRACT | Status: DC

## 2015-02-14 MED ORDER — OXYBUTYNIN CHLORIDE 5 MG PO TABS
5.0000 mg | ORAL_TABLET | Freq: Two times a day (BID) | ORAL | Status: DC
  Administered 2015-02-14 – 2015-02-20 (×12): 5 mg via ORAL
  Filled 2015-02-14 (×14): qty 1

## 2015-02-14 MED ORDER — PREDNISONE 10 MG PO TABS
10.0000 mg | ORAL_TABLET | Freq: Every day | ORAL | Status: DC
  Administered 2015-02-14 – 2015-02-20 (×7): 10 mg via ORAL
  Filled 2015-02-14 (×7): qty 1

## 2015-02-14 MED ORDER — PANTOPRAZOLE SODIUM 40 MG PO TBEC
40.0000 mg | DELAYED_RELEASE_TABLET | Freq: Every day | ORAL | Status: DC
Start: 2015-02-14 — End: 2015-02-20
  Administered 2015-02-14 – 2015-02-20 (×7): 40 mg via ORAL
  Filled 2015-02-14 (×7): qty 1

## 2015-02-14 MED ORDER — SODIUM CHLORIDE 0.9 % IV BOLUS (SEPSIS)
1000.0000 mL | Freq: Once | INTRAVENOUS | Status: AC
  Administered 2015-02-14: 1000 mL via INTRAVENOUS

## 2015-02-14 MED ORDER — BUDESONIDE 0.25 MG/2ML IN SUSP
0.2500 mg | Freq: Two times a day (BID) | RESPIRATORY_TRACT | Status: DC
  Administered 2015-02-14 – 2015-02-20 (×12): 0.25 mg via RESPIRATORY_TRACT
  Filled 2015-02-14 (×13): qty 2

## 2015-02-14 MED ORDER — FENTANYL CITRATE (PF) 100 MCG/2ML IJ SOLN
100.0000 ug | Freq: Once | INTRAMUSCULAR | Status: AC
  Administered 2015-02-14: 100 ug via INTRAVENOUS
  Filled 2015-02-14: qty 2

## 2015-02-14 MED ORDER — PROMETHAZINE HCL 25 MG/ML IJ SOLN
25.0000 mg | Freq: Once | INTRAMUSCULAR | Status: AC
  Administered 2015-02-14: 25 mg via INTRAVENOUS
  Filled 2015-02-14: qty 1

## 2015-02-14 MED ORDER — SODIUM CHLORIDE 0.9 % IV SOLN
INTRAVENOUS | Status: DC
  Administered 2015-02-14 – 2015-02-15 (×2): via INTRAVENOUS

## 2015-02-14 MED ORDER — LORAZEPAM 2 MG/ML IJ SOLN
1.0000 mg | INTRAMUSCULAR | Status: DC | PRN
  Administered 2015-02-14 – 2015-02-15 (×2): 1 mg via INTRAVENOUS
  Filled 2015-02-14 (×2): qty 1

## 2015-02-14 NOTE — ED Notes (Addendum)
Pt [facility] via [facility]-Per EMS, Pt sts that she smoked crack last night and has not slept in 48 hrs. Pt adds that she had emesis last pm but has only lower abd pain today. Pt has hx of HTN but has not taken her meds in 3 days. Pt is A&O, ambulatory w/o difficulty and in NAD.

## 2015-02-14 NOTE — ED Notes (Addendum)
Pt continues to get out of bed after being told to stay in bed. Pt has bed alarm but does not listen even though she is A&O. 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 Pt also continues to yell out at staff after being told to be quiet

## 2015-02-14 NOTE — ED Notes (Addendum)
Pt reports HA and lower abd pain and L arm numbness x8 months. Pt sts that she has not done crack since June 2015. Pt is A&O and in NAD
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
## 2015-02-14 NOTE — ED Notes (Signed)
Hospitalist at bedside 

## 2015-02-14 NOTE — ED Notes (Signed)
Bed: [id number] Expected date: 02/14/15 Expected time: 9:59 AM Means of arrival: Ambulance Comments: HA, Crack cocaine last night, up 48 hours
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
## 2015-02-14 NOTE — ED Notes (Signed)
Pt told once again to stop yelling and stay in the bed, but is uncooperative. Dr Oleta Mouse made aware of pt behavior and asked for interventions for pt. Charge RN reports that there are no sitters available until 3pm

## 2015-02-14 NOTE — ED Notes (Signed)
Pt used call light and sts "  I need to go on vacation." Pt was advised not to abuse the call light. Pt in NAD

## 2015-02-15 DIAGNOSIS — J189 Pneumonia, unspecified organism: Secondary | ICD-10-CM | POA: Diagnosis present

## 2015-02-15 LAB — COMPREHENSIVE METABOLIC PANEL
ALK PHOS: 88 U/L (ref 38–126)
ALT: 12 U/L — ABNORMAL LOW (ref 14–54)
ANION GAP: 5 (ref 5–15)
AST: 13 U/L — ABNORMAL LOW (ref 15–41)
Albumin: 3 g/dL — ABNORMAL LOW (ref 3.5–5.0)
BUN: 24 mg/dL — ABNORMAL HIGH (ref 6–20)
CALCIUM: 8.4 mg/dL — AB (ref 8.9–10.3)
CO2: 22 mmol/L (ref 22–32)
Chloride: 111 mmol/L (ref 101–111)
Creatinine, Ser: 1.38 mg/dL — ABNORMAL HIGH (ref 0.44–1.00)
GFR, EST AFRICAN AMERICAN: 47 mL/min — AB (ref >60)
GFR, EST NON AFRICAN AMERICAN: 40 mL/min — AB (ref >60)
Glucose, Bld: 122 mg/dL — ABNORMAL HIGH (ref 65–99)
Potassium: 5.1 mmol/L (ref 3.5–5.1)
SODIUM: 138 mmol/L (ref 135–145)
Total Bilirubin: 0.3 mg/dL (ref 0.3–1.2)
Total Protein: 6.6 g/dL (ref 6.5–8.1)

## 2015-02-15 LAB — CBC WITH DIFFERENTIAL/PLATELET
Basophils Absolute: 0 10*3/uL (ref 0.0–0.1)
Basophils Relative: 0 %
EOS ABS: 0.1 10*3/uL (ref 0.0–0.7)
EOS PCT: 1 %
HCT: 37.8 % (ref 36.0–46.0)
Hemoglobin: 12.2 g/dL (ref 12.0–15.0)
LYMPHS ABS: 1.6 10*3/uL (ref 0.7–4.0)
Lymphocytes Relative: 21 %
MCH: 29 pg (ref 26.0–34.0)
MCHC: 32.3 g/dL (ref 30.0–36.0)
MCV: 89.8 fL (ref 78.0–100.0)
MONOS PCT: 5 %
Monocytes Absolute: 0.4 10*3/uL (ref 0.1–1.0)
Neutro Abs: 5.6 10*3/uL (ref 1.7–7.7)
Neutrophils Relative %: 73 %
PLATELETS: 355 10*3/uL (ref 150–400)
RBC: 4.21 MIL/uL (ref 3.87–5.11)
RDW: 14.5 % (ref 11.5–15.5)
WBC: 7.6 10*3/uL (ref 4.0–10.5)

## 2015-02-15 MED ORDER — DEXTROSE 5 % IV SOLN
500.0000 mg | INTRAVENOUS | Status: DC
  Administered 2015-02-15 – 2015-02-16 (×2): 500 mg via INTRAVENOUS
  Filled 2015-02-15 (×2): qty 500

## 2015-02-15 MED ORDER — LORAZEPAM 2 MG/ML IJ SOLN
0.5000 mg | INTRAMUSCULAR | Status: DC | PRN
  Administered 2015-02-15 – 2015-02-18 (×4): 0.5 mg via INTRAVENOUS
  Filled 2015-02-15 (×4): qty 1

## 2015-02-15 MED ORDER — ALBUTEROL SULFATE (2.5 MG/3ML) 0.083% IN NEBU
2.5000 mg | INHALATION_SOLUTION | RESPIRATORY_TRACT | Status: DC | PRN
Start: 2015-02-15 — End: 2015-02-20
  Administered 2015-02-15 – 2015-02-19 (×5): 2.5 mg via RESPIRATORY_TRACT
  Filled 2015-02-15 (×5): qty 3

## 2015-02-15 NOTE — Progress Notes (Signed)
[name] [id number] DOB: 1955/11/04 DOA: 02/14/2015 PCP: Philis Fendt, MD  Assessment at time of admission on  [name] is a 60 y.o. female with a past medical hospital hypertension, hyperlipidemia, rheumatoid arthritis, cocaine abuse, tobacco use disorder, COPD who is brought to the emergency department via EMS after eating found [place] a local park.   The patient stated earlier that she smoked crack cocaine last night and she has not slept in 48 hours. She had emesis last night and then developed lower abdominal pain today. She has not taken her antihypertensives or other medications. She did not provide further information. She has had similar admissions in the past due to cocaine abuse. I try to contact her sister, [name], and my phone call went to voicemail. I was unable to leave a message due to her voicemail being full.  Physical exam and workup in the emergency department, revealed the patient still confused she is only oriented to name and place, partially oriented to time. She also has lower abdominal pain. Workup reveals a negative CT scan, no new EKG changes, mild leukocytosis with an abnormal urinalysis. Summary&Daily Progress Notes since admission 02/15/15: I have seen and examined [name] at bedside, reviewed her chart and discussed with nursing. She has an acute metabolic encephalopathy likely resulting from UTI/pneumonia in setting of polysubstance abuse. She has a productive cough and says she was sick prior to going to the park. Her white count has improved some. Will continue ceftriaxone, add Zithromax for a typical organism coverage, discontinue IV fluids as patient eating, obtain sputum culture, add albuterol as needed and transfer to MedSurg when bed becomes available. Patient complains of left arm pain which has been going on for the last 8 months. Will defer further management of this to her primary care providers. Counseled patient on need to  stop Problem List Plan  Principal Problem:   Encephalopathy, metabolic Active Problems:   HTN (hypertension), benign   Cocaine abuse   AKI (acute kidney injury) ([place])   UTI (lower urinary tract infection)   COPD (chronic obstructive pulmonary disease) (H[facility])   GERD (gastroesophageal reflux disease)   Dehydration   CAP (community acquired pneumonia)   Day 2 Ceftriaxone  Zithromax  Albuterol as needed  Discontinue normal saline  Follow septic work up  Sputum culture  Transfer to [facility] when bed becomes available   Code Status: Full Code Family Communication: None at bedside Disposition Plan: Transfer to med surg when bed becomes available Consultants:  None Procedures:  None Antibiotics:  Ceftriaxone 02/14/15>>  Azithromycin 02/15/15>>  HPI/Subjective: Feels better, complains of left arm pain. Coughing.  Objective: Filed Vitals:   02/15/15 0400 02/15/15 0600  BP: 128/66 151/80  Pulse: 73 68  Temp: 98.7 F (37.1 C)   Resp: 22     Intake/Output Summary (Last 24 hours) at 02/15/15 0829 Last data filed at 02/15/15 0600  Gross per 24 hour  Intake 1286.67 ml  Output    150 ml  Net 1136.67 ml   Filed Weights   02/15/15 0600  Weight: 85.3 kg (188 lb 0.8 oz)    Exam:   General:  Comfortable at rest.  Cardiovascular: S1-S2 normal. No murmurs. Pulse regular.  Respiratory: Good air entry bilaterally. Scant rhonchi, no rales.  Abdomen: Soft and nontender. Normal bowel sounds. No organomegaly.  Musculoskeletal: No pedal edema   Neurological: Intact  Data Reviewed: Basic Metabolic Panel:  Recent Labs Lab 02/14/15 1035 02/14/15 2218 02/15/15  0350  NA 140  --  138  K 3.9  --  5.1  CL 105  --  111  CO2 22  --  22  GLUCOSE 83  --  122*  BUN 29*  --  24*  CREATININE 1.47*  --  1.38*  CALCIUM 9.0  --  8.4*  MG  --  2.0  --   PHOS  --  3.2  --    Liver Function Tests:  Recent Labs Lab 02/14/15 1035 02/15/15 0350  AST 18 13*  ALT 15 12*   ALKPHOS 100 88  BILITOT 0.7 0.3  PROT 8.3* 6.6  ALBUMIN 3.8 3.0*    Recent Labs Lab 02/14/15 1035  LIPASE 25   No results for input(s): AMMONIA in the last 168 hours. CBC:  Recent Labs Lab 02/14/15 1157 02/15/15 0350  WBC 10.9* 7.6  NEUTROABS 7.4 5.6  HGB 14.9 12.2  HCT 45.4 37.8  MCV 89.7 89.8  PLT 352 355   Cardiac Enzymes: No results for input(s): CKTOTAL, CKMB, CKMBINDEX, TROPONINI in the last 168 hours. BNP (last 3 results)  Recent Labs  02/28/14 1646 10/31/14 1012  BNP 22.6 252.6*    ProBNP (last 3 results) No results for input(s): PROBNP in the last 8760 hours.  CBG: No results for input(s): GLUCAP in the last 168 hours.  Recent Results (from the past 240 hour(s))  MRSA PCR Screening     Status: None   Collection Time: 02/14/15  5:48 PM  Result Value Ref Range Status   MRSA by PCR NEGATIVE NEGATIVE Final    Comment:        The GeneXpert MRSA Assay (FDA approved for NASAL specimens only), is one component of a comprehensive MRSA colonization surveillance program. It is not intended to diagnose MRSA infection nor to guide or monitor treatment for MRSA infections.   Culture, blood (x 2)     Status: None (Preliminary result)   Collection Time: 02/14/15  6:21 PM  Result Value Ref Range Status   Specimen Description BLOOD LEFT HAND  Final   Special Requests IN PEDIATRIC BOTTLE  0.5CC  Final   Culture PENDING  Incomplete   Report Status PENDING  Incomplete  Culture, blood (x 2)     Status: None (Preliminary result)   Collection Time: 02/14/15  6:21 PM  Result Value Ref Range Status   Specimen Description BLOOD LEFT HAND  Final   Special Requests BOTTLES DRAWN AEROBIC ONLY 5CC  Final   Culture PENDING  Incomplete   Report Status PENDING  Incomplete     Studies: Ct Abdomen Pelvis Wo Contrast  02/14/2015  CLINICAL DATA:  Generalized abdominal pain and vomiting since yesterday. EXAM: CT ABDOMEN AND PELVIS WITHOUT CONTRAST TECHNIQUE:  Multidetector CT imaging of the abdomen and pelvis was performed following the standard protocol without IV contrast. COMPARISON:  Noncontrast CT on 11/18/2013 FINDINGS: Lower chest:  Small hiatal hernia again noted. Hepatobiliary: No mass visualized on this un-enhanced exam. Gallbladder is unremarkable. Pancreas: No mass or inflammatory process identified on this un-enhanced exam. Spleen: Within normal limits in size. Adrenals/Urinary Tract: No evidence of urolithiasis or hydronephrosis. No definite mass visualized on this un-enhanced exam. Stomach/Bowel: No evidence of obstruction, inflammatory process, or abnormal fluid collections. Normal appendix visualized. Some motion artifact noted. Vascular/Lymphatic: No pathologically enlarged lymph nodes. No evidence of abdominal aortic aneurysm. Reproductive: Prior hysterectomy noted. Adnexal regions are unremarkable in appearance. Other: None. Musculoskeletal:  No suspicious bone lesions identified. IMPRESSION: No acute  findings identified within the abdomen or pelvis. Stable small hiatal hernia. Electronically Signed   By: Earle Gell M.D.   On: 02/14/2015 15:36   Dg Chest 2 View  02/14/2015  CLINICAL DATA:  Right chest pain, headache EXAM: CHEST  2 VIEW COMPARISON:  10/28/2014 FINDINGS: The heart size and mediastinal contours are within normal limits. Both lungs are clear. The visualized skeletal structures are unremarkable. Aortic atherosclerosis noted with mild tortuosity. Monitor leads overlie the chest. No interval change. IMPRESSION: No active cardiopulmonary disease. Electronically Signed   By: Jerilynn Mages.  Shick M.D.   On: 02/14/2015 10:58   Ct Head Wo Contrast  02/14/2015  CLINICAL DATA:  Headache, LEFT arm numbness for 6 months, at chest pain after cocaine, lower abdominal pain, history hypertension and rheumatoid arthritis EXAM: CT HEAD WITHOUT CONTRAST TECHNIQUE: Contiguous axial images were obtained from the base of the skull through the vertex without  intravenous contrast. COMPARISON:  10/28/2014 FINDINGS: Normal ventricular morphology. No midline shift or mass effect. Normal appearance of brain parenchyma. No intracranial hemorrhage, mass lesion or evidence of acute infarction. No extra-axial fluid collections. Atherosclerotic calcifications at the carotid siphons. Bones and sinuses unremarkable. IMPRESSION: No acute intracranial abnormalities. Electronically Signed   By: Lavonia Dana M.D.   On: 02/14/2015 11:12   Dg Chest Port 1 View  02/14/2015  CLINICAL DATA:  Sepsis.  Recent cocaine abuse EXAM: PORTABLE CHEST 1 VIEW COMPARISON:  02/14/2015 FINDINGS: Heart size is normal. Aortic atherosclerosis noted. No pleural effusion identified. There is mild diffuse edema. No airspace consolidation. IMPRESSION: Mild pulmonary edema. Electronically Signed   By: Kerby Moors M.D.   On: 02/14/2015 18:15    Scheduled Meds: . amLODipine  5 mg Oral Daily  . aspirin EC  81 mg Oral Daily  . atorvastatin  40 mg Oral q1800  . azithromycin  500 mg Intravenous Q24H  . budesonide (PULMICORT) nebulizer solution  0.25 mg Nebulization BID  . cefTRIAXone (ROCEPHIN)  IV  1 g Intravenous Q24H  . oxybutynin  5 mg Oral BID  . pantoprazole  40 mg Oral Daily  . predniSONE  10 mg Oral Daily   Continuous Infusions:       [name]  [facility] Pager [phone]. If 7PM-7AM, please contact night-coverage at [email or web address], password [facility] 02/15/2015, 8:29 AM  LOS: 1 day

## 2015-02-16 LAB — CBC WITH DIFFERENTIAL/PLATELET
Basophils Absolute: 0 10*3/uL (ref 0.0–0.1)
Basophils Relative: 1 %
EOS ABS: 0.2 10*3/uL (ref 0.0–0.7)
Eosinophils Relative: 3 %
HEMATOCRIT: 37.8 % (ref 36.0–46.0)
HEMOGLOBIN: 12.4 g/dL (ref 12.0–15.0)
LYMPHS ABS: 2.8 10*3/uL (ref 0.7–4.0)
LYMPHS PCT: 42 %
MCH: 29.4 pg (ref 26.0–34.0)
MCHC: 32.8 g/dL (ref 30.0–36.0)
MCV: 89.6 fL (ref 78.0–100.0)
MONOS PCT: 7 %
Monocytes Absolute: 0.4 10*3/uL (ref 0.1–1.0)
NEUTROS ABS: 3.2 10*3/uL (ref 1.7–7.7)
NEUTROS PCT: 47 %
Platelets: 373 10*3/uL (ref 150–400)
RBC: 4.22 MIL/uL (ref 3.87–5.11)
RDW: 14.4 % (ref 11.5–15.5)
WBC: 6.6 10*3/uL (ref 4.0–10.5)

## 2015-02-16 LAB — COMPREHENSIVE METABOLIC PANEL
ALBUMIN: 3.3 g/dL — AB (ref 3.5–5.0)
ALK PHOS: 83 U/L (ref 38–126)
ALT: 11 U/L — AB (ref 14–54)
ANION GAP: 8 (ref 5–15)
AST: 14 U/L — ABNORMAL LOW (ref 15–41)
BILIRUBIN TOTAL: 0.2 mg/dL — AB (ref 0.3–1.2)
BUN: 26 mg/dL — ABNORMAL HIGH (ref 6–20)
CHLORIDE: 104 mmol/L (ref 101–111)
CO2: 25 mmol/L (ref 22–32)
Calcium: 8.4 mg/dL — ABNORMAL LOW (ref 8.9–10.3)
Creatinine, Ser: 1.29 mg/dL — ABNORMAL HIGH (ref 0.44–1.00)
GFR calc non Af Amer: 44 mL/min — ABNORMAL LOW (ref >60)
GFR, EST AFRICAN AMERICAN: 51 mL/min — AB (ref >60)
Glucose, Bld: 94 mg/dL (ref 65–99)
Potassium: 4 mmol/L (ref 3.5–5.1)
SODIUM: 137 mmol/L (ref 135–145)
Total Protein: 7 g/dL (ref 6.5–8.1)

## 2015-02-16 LAB — URINE CULTURE

## 2015-02-16 MED ORDER — SODIUM CHLORIDE 0.9 % IV SOLN
INTRAVENOUS | Status: AC
  Administered 2015-02-17 – 2015-02-18 (×5): via INTRAVENOUS

## 2015-02-16 MED ORDER — AZITHROMYCIN 500 MG PO TABS
500.0000 mg | ORAL_TABLET | Freq: Every day | ORAL | Status: AC
Start: 2015-02-17 — End: 2015-02-18
  Administered 2015-02-17 – 2015-02-18 (×2): 500 mg via ORAL
  Filled 2015-02-16 (×2): qty 1

## 2015-02-16 MED ORDER — CEPHALEXIN 500 MG PO CAPS
500.0000 mg | ORAL_CAPSULE | Freq: Three times a day (TID) | ORAL | Status: DC
  Administered 2015-02-16 – 2015-02-18 (×6): 500 mg via ORAL
  Filled 2015-02-16 (×9): qty 1

## 2015-02-16 MED ORDER — OXYCODONE-ACETAMINOPHEN 5-325 MG PO TABS
1.0000 | ORAL_TABLET | ORAL | Status: DC | PRN
  Administered 2015-02-16 – 2015-02-20 (×9): 1 via ORAL
  Filled 2015-02-16 (×9): qty 1

## 2015-02-16 MED ORDER — METOCLOPRAMIDE HCL 5 MG/ML IJ SOLN
5.0000 mg | Freq: Once | INTRAMUSCULAR | Status: AC
  Administered 2015-02-17: 5 mg via INTRAVENOUS
  Filled 2015-02-16: qty 2

## 2015-02-16 NOTE — Progress Notes (Signed)
Per MD, OK to leave IV out.

## 2015-02-16 NOTE — Progress Notes (Signed)
Utilization Review Completed.[name] T2/05/2015  

## 2015-02-16 NOTE — Progress Notes (Signed)
[name] [id number] DOB: [birth year] DOA: 02/14/2015 PCP: Philis Fendt, MD  Assessment at time of admission on  [name] is a 60 y.o. female with a past medical hospital hypertension, hyperlipidemia, rheumatoid arthritis, cocaine abuse, tobacco use disorder, COPD who is brought to the emergency department via EMS after eating found [place] a local park.   The patient stated earlier that she smoked crack cocaine last night and she has not slept in 48 hours. She had emesis last night and then developed lower abdominal pain today. She has not taken her antihypertensives or other medications. She did not provide further information. She has had similar admissions in the past due to cocaine abuse. I try to contact her sister, [name], and my phone call went to voicemail. I was unable to leave a message due to her voicemail being full.  Physical exam and workup in the emergency department, revealed the patient still confused she is only oriented to name and place, partially oriented to time. She also has lower abdominal pain. Workup reveals a negative CT scan, no new EKG changes, mild leukocytosis with an abnormal urinalysis. Summary&Daily Progress Notes since admission 02/15/15: I have seen and examined [name] at bedside, reviewed her chart and discussed with nursing. She has an acute metabolic encephalopathy likely resulting from UTI/pneumonia in setting of polysubstance abuse. She has a productive cough and says she was sick prior to going to the park. Her white count has improved some. Will continue ceftriaxone, add Zithromax for a typical organism coverage, discontinue IV fluids as patient eating, obtain sputum culture, add albuterol as needed and transfer to MedSurg when bed becomes available. Patient complains of left arm pain which has been going on for the last 8 months. Will defer further management of this to her primary care providers. Counseled patient on need to  stop. 02/16/15: Feels better. More with it. Complains about pain and swelling of the right arm at IV site which symptoms infiltrated. Doing better respiratory wise with no distress. Will discontinue oxygen, change antibiotics to oral, discontinue IV fluids and mobilize as possible. Likely d/c home tomorrow if she continues to do well. Problem List Plan  Principal Problem:   Encephalopathy, metabolic Active Problems:   HTN (hypertension), benign   Cocaine abuse   AKI (acute kidney injury) ([place])   UTI (lower urinary tract infection)   COPD (chronic obstructive pulmonary disease) ([facility])   GERD (gastroesophageal reflux disease)   Dehydration   CAP (community acquired pneumonia)   D/c ceftriaxone  Keflex  Change Zithromax to oral  Discontinue oxygen  Discontinue IV fluids  Mobilize as possible   Code Status: Full Code Family Communication: None at bedside Disposition Plan: Home tomorrow if she continues to do well. Consultants:  None Procedures:  None Antibiotics:  Ceftriaxone 02/14/15>>02/16/15  Azithromycin 02/15/15>>  Keflex 02/16/15>>  HPI/Subjective: Complains of right arm pain and swelling. Has chronic left shoulder pain.  Objective: Filed Vitals:   02/16/15 0012 02/16/15 0652  BP: 119/60 133/81  Pulse: 73 69  Temp: 98 F (36.7 C) 98.2 F (36.8 C)  Resp: 20 19    Intake/Output Summary (Last 24 hours) at 02/16/15 1041 Last data filed at 02/16/15 0653  Gross per 24 hour  Intake    800 ml  Output    350 ml  Net    450 ml   Filed Weights   02/15/15 0600  Weight: 85.3 kg (188 lb 0.8 oz)    Exam:  General:  Comfortable at rest.  Cardiovascular: S1-S2 normal. No murmurs. Pulse regular.  Respiratory: Good air entry bilaterally. No rhonchi or rales.  Abdomen: Soft and nontender. Normal bowel sounds. No organomegaly.  Musculoskeletal: No pedal edema   Neurological: Intact  Data Reviewed: Basic Metabolic Panel:  Recent Labs Lab 02/14/15 1035  02/14/15 2218 02/15/15 0350 02/16/15 0617  NA 140  --  138 137  K 3.9  --  5.1 4.0  CL 105  --  111 104  CO2 22  --  22 25  GLUCOSE 83  --  122* 94  BUN 29*  --  24* 26*  CREATININE 1.47*  --  1.38* 1.29*  CALCIUM 9.0  --  8.4* 8.4*  MG  --  2.0  --   --   PHOS  --  3.2  --   --    Liver Function Tests:  Recent Labs Lab 02/14/15 1035 02/15/15 0350 02/16/15 0617  AST 18 13* 14*  ALT 15 12* 11*  ALKPHOS 100 88 83  BILITOT 0.7 0.3 0.2*  PROT 8.3* 6.6 7.0  ALBUMIN 3.8 3.0* 3.3*    Recent Labs Lab 02/14/15 1035  LIPASE 25   No results for input(s): AMMONIA in the last 168 hours. CBC:  Recent Labs Lab 02/14/15 1157 02/15/15 0350 02/16/15 0617  WBC 10.9* 7.6 6.6  NEUTROABS 7.4 5.6 3.2  HGB 14.9 12.2 12.4  HCT 45.4 37.8 37.8  MCV 89.7 89.8 89.6  PLT 352 355 373   Cardiac Enzymes: No results for input(s): CKTOTAL, CKMB, CKMBINDEX, TROPONINI in the last 168 hours. BNP (last 3 results)  Recent Labs  02/28/14 1646 10/31/14 1012  BNP 22.6 252.6*    ProBNP (last 3 results) No results for input(s): PROBNP in the last 8760 hours.  CBG: No results for input(s): GLUCAP in the last 168 hours.  Recent Results (from the past 240 hour(s))  Urine culture     Status: None (Preliminary result)   Collection Time: 02/14/15 12:06 PM  Result Value Ref Range Status   Specimen Description URINE, CLEAN CATCH  Final   Special Requests NONE  Final   Culture   Final    TOO YOUNG TO READ Performed at [facility]    Report Status PENDING  Incomplete  MRSA PCR Screening     Status: None   Collection Time: 02/14/15  5:48 PM  Result Value Ref Range Status   MRSA by PCR NEGATIVE NEGATIVE Final    Comment:        The GeneXpert MRSA Assay (FDA approved for NASAL specimens only), is one component of a comprehensive MRSA colonization surveillance program. It is not intended to diagnose MRSA infection nor to guide or monitor treatment for MRSA infections.    Culture, blood (x 2)     Status: None (Preliminary result)   Collection Time: 02/14/15  6:21 PM  Result Value Ref Range Status   Specimen Description BLOOD LEFT HAND  Final   Special Requests IN PEDIATRIC BOTTLE  0.5CC  Final   Culture   Final    NO GROWTH < 24 HOURS Performed at [facility]    Report Status PENDING  Incomplete  Culture, blood (x 2)     Status: None (Preliminary result)   Collection Time: 02/14/15  6:21 PM  Result Value Ref Range Status   Specimen Description BLOOD LEFT HAND  Final   Special Requests BOTTLES DRAWN AEROBIC ONLY 5CC  Final   Culture  Final    NO GROWTH < 24 HOURS Performed at [facility]    Report Status PENDING  Incomplete     Studies: Ct Abdomen Pelvis Wo Contrast  02/14/2015  CLINICAL DATA:  Generalized abdominal pain and vomiting since yesterday. EXAM: CT ABDOMEN AND PELVIS WITHOUT CONTRAST TECHNIQUE: Multidetector CT imaging of the abdomen and pelvis was performed following the standard protocol without IV contrast. COMPARISON:  Noncontrast CT on 11/18/2013 FINDINGS: Lower chest:  Small hiatal hernia again noted. Hepatobiliary: No mass visualized on this un-enhanced exam. Gallbladder is unremarkable. Pancreas: No mass or inflammatory process identified on this un-enhanced exam. Spleen: Within normal limits in size. Adrenals/Urinary Tract: No evidence of urolithiasis or hydronephrosis. No definite mass visualized on this un-enhanced exam. Stomach/Bowel: No evidence of obstruction, inflammatory process, or abnormal fluid collections. Normal appendix visualized. Some motion artifact noted. Vascular/Lymphatic: No pathologically enlarged lymph nodes. No evidence of abdominal aortic aneurysm. Reproductive: Prior hysterectomy noted. Adnexal regions are unremarkable in appearance. Other: None. Musculoskeletal:  No suspicious bone lesions identified. IMPRESSION: No acute findings identified within the abdomen or pelvis. Stable small hiatal hernia.  Electronically Signed   By: Earle Gell M.D.   On: 02/14/2015 15:36   Dg Chest 2 View  02/14/2015  CLINICAL DATA:  Right chest pain, headache EXAM: CHEST  2 VIEW COMPARISON:  10/28/2014 FINDINGS: The heart size and mediastinal contours are within normal limits. Both lungs are clear. The visualized skeletal structures are unremarkable. Aortic atherosclerosis noted with mild tortuosity. Monitor leads overlie the chest. No interval change. IMPRESSION: No active cardiopulmonary disease. Electronically Signed   By: Jerilynn Mages.  Shick M.D.   On: 02/14/2015 10:58   Ct Head Wo Contrast  02/14/2015  CLINICAL DATA:  Headache, LEFT arm numbness for 6 months, at chest pain after cocaine, lower abdominal pain, history hypertension and rheumatoid arthritis EXAM: CT HEAD WITHOUT CONTRAST TECHNIQUE: Contiguous axial images were obtained from the base of the skull through the vertex without intravenous contrast. COMPARISON:  10/28/2014 FINDINGS: Normal ventricular morphology. No midline shift or mass effect. Normal appearance of brain parenchyma. No intracranial hemorrhage, mass lesion or evidence of acute infarction. No extra-axial fluid collections. Atherosclerotic calcifications at the carotid siphons. Bones and sinuses unremarkable. IMPRESSION: No acute intracranial abnormalities. Electronically Signed   By: Lavonia Dana M.D.   On: 02/14/2015 11:12   Dg Chest Port 1 View  02/14/2015  CLINICAL DATA:  Sepsis.  Recent cocaine abuse EXAM: PORTABLE CHEST 1 VIEW COMPARISON:  02/14/2015 FINDINGS: Heart size is normal. Aortic atherosclerosis noted. No pleural effusion identified. There is mild diffuse edema. No airspace consolidation. IMPRESSION: Mild pulmonary edema. Electronically Signed   By: Kerby Moors M.D.   On: 02/14/2015 18:15    Scheduled Meds: . amLODipine  5 mg Oral Daily  . aspirin EC  81 mg Oral Daily  . atorvastatin  40 mg Oral q1800  . [START ON 02/17/2015] azithromycin  500 mg Oral Daily  . budesonide (PULMICORT)  nebulizer solution  0.25 mg Nebulization BID  . cephALEXin  500 mg Oral 3 times per day  . oxybutynin  5 mg Oral BID  . pantoprazole  40 mg Oral Daily  . predniSONE  10 mg Oral Daily   Continuous Infusions:      Carlous Olivares  [facility] Pager [phone]. If 7PM-7AM, please contact night-coverage at [email or web address], password [facility] 02/16/2015, 10:41 AM  LOS: 2 days

## 2015-02-17 ENCOUNTER — Inpatient Hospital Stay (HOSPITAL_COMMUNITY): Payer: Medicaid Other

## 2015-02-17 DIAGNOSIS — K5900 Constipation, unspecified: Secondary | ICD-10-CM | POA: Diagnosis present

## 2015-02-17 LAB — CBC WITH DIFFERENTIAL/PLATELET
BASOS ABS: 0.1 10*3/uL (ref 0.0–0.1)
Basophils Relative: 1 %
Eosinophils Absolute: 0.3 10*3/uL (ref 0.0–0.7)
Eosinophils Relative: 3 %
HCT: 39.4 % (ref 36.0–46.0)
HEMOGLOBIN: 13 g/dL (ref 12.0–15.0)
LYMPHS ABS: 3.8 10*3/uL (ref 0.7–4.0)
LYMPHS PCT: 37 %
MCH: 29.1 pg (ref 26.0–34.0)
MCHC: 33 g/dL (ref 30.0–36.0)
MCV: 88.1 fL (ref 78.0–100.0)
Monocytes Absolute: 0.5 10*3/uL (ref 0.1–1.0)
Monocytes Relative: 5 %
NEUTROS ABS: 5.4 10*3/uL (ref 1.7–7.7)
NEUTROS PCT: 54 %
PLATELETS: 398 10*3/uL (ref 150–400)
RBC: 4.47 MIL/uL (ref 3.87–5.11)
RDW: 14.1 % (ref 11.5–15.5)
WBC: 10 10*3/uL (ref 4.0–10.5)

## 2015-02-17 LAB — COMPREHENSIVE METABOLIC PANEL
ALK PHOS: 90 U/L (ref 38–126)
ALT: 12 U/L — AB (ref 14–54)
AST: 17 U/L (ref 15–41)
Albumin: 3.1 g/dL — ABNORMAL LOW (ref 3.5–5.0)
Anion gap: 12 (ref 5–15)
BUN: 33 mg/dL — AB (ref 6–20)
CALCIUM: 8.2 mg/dL — AB (ref 8.9–10.3)
CHLORIDE: 105 mmol/L (ref 101–111)
CO2: 20 mmol/L — AB (ref 22–32)
CREATININE: 1.41 mg/dL — AB (ref 0.44–1.00)
GFR, EST AFRICAN AMERICAN: 46 mL/min — AB (ref >60)
GFR, EST NON AFRICAN AMERICAN: 39 mL/min — AB (ref >60)
Glucose, Bld: 113 mg/dL — ABNORMAL HIGH (ref 65–99)
Potassium: 4 mmol/L (ref 3.5–5.1)
SODIUM: 137 mmol/L (ref 135–145)
Total Bilirubin: 0.4 mg/dL (ref 0.3–1.2)
Total Protein: 6.8 g/dL (ref 6.5–8.1)

## 2015-02-17 MED ORDER — PROMETHAZINE HCL 25 MG/ML IJ SOLN
12.5000 mg | Freq: Four times a day (QID) | INTRAMUSCULAR | Status: DC | PRN
  Administered 2015-02-17 – 2015-02-18 (×2): 12.5 mg via INTRAVENOUS
  Filled 2015-02-17 (×2): qty 1

## 2015-02-17 MED ORDER — LACTULOSE 10 GM/15ML PO SOLN
20.0000 g | Freq: Three times a day (TID) | ORAL | Status: AC
  Administered 2015-02-17 – 2015-02-18 (×3): 20 g via ORAL
  Filled 2015-02-17 (×3): qty 30

## 2015-02-17 MED ORDER — NICOTINE 21 MG/24HR TD PT24
21.0000 mg | MEDICATED_PATCH | Freq: Every day | TRANSDERMAL | Status: DC
  Administered 2015-02-17: 21 mg via TRANSDERMAL
  Filled 2015-02-17 (×4): qty 1

## 2015-02-17 MED ORDER — METOCLOPRAMIDE HCL 5 MG/ML IJ SOLN
10.0000 mg | Freq: Once | INTRAMUSCULAR | Status: AC
  Administered 2015-02-17: 10 mg via INTRAVENOUS
  Filled 2015-02-17: qty 2

## 2015-02-17 MED ORDER — MORPHINE SULFATE (PF) 2 MG/ML IV SOLN
2.0000 mg | INTRAVENOUS | Status: DC | PRN
  Administered 2015-02-18 – 2015-02-20 (×2): 2 mg via INTRAVENOUS
  Filled 2015-02-17 (×3): qty 1

## 2015-02-17 MED ORDER — PROMETHAZINE HCL 25 MG/ML IJ SOLN
12.5000 mg | Freq: Once | INTRAMUSCULAR | Status: AC
  Administered 2015-02-17: 12.5 mg via INTRAVENOUS
  Filled 2015-02-17: qty 1

## 2015-02-17 NOTE — Progress Notes (Signed)
Patient has continued vomiting this evening. Phenergan administered with relief for about an hour. MD notified.

## 2015-02-17 NOTE — Progress Notes (Signed)
[name] [id number] DOB: 1955/03/01 DOA: 02/14/2015 PCP: Philis Fendt, MD  Assessment at time of admission on  [name] is a 60 y.o. female with a past medical hospital hypertension, hyperlipidemia, rheumatoid arthritis, cocaine abuse, tobacco use disorder, COPD who is brought to the emergency department via EMS after eating found [place] a [place] a local park.   The patient stated earlier that she smoked crack cocaine last night and she has not slept in 48 hours. She had emesis last night and then developed lower abdominal pain today. She has not taken her antihypertensives or other medications. She did not provide further information. She has had similar admissions in the past due to cocaine abuse. I try to contact her sister, [name], and my phone call went to voicemail. I was unable to leave a message due to her voicemail being full.  Physical exam and workup in the emergency department, revealed the patient still confused she is only oriented to name and place, partially oriented to time. She also has lower abdominal pain. Workup reveals a negative CT scan, no new EKG changes, mild leukocytosis with an abnormal urinalysis. Summary&Daily Progress Notes since admission 02/15/15: I have seen and examined [name] at bedside, reviewed her chart and discussed with nursing. She has an acute metabolic encephalopathy likely resulting from UTI/pneumonia in setting of polysubstance abuse. She has a productive cough and says she was sick prior to going to the park. Her white count has improved some. Will continue ceftriaxone, add Zithromax for a typical organism coverage, discontinue IV fluids as patient eating, obtain sputum culture, add albuterol as needed and transfer to MedSurg when bed becomes available. Patient complains of left arm pain which has been going on for the last 8 months. Will defer further management of this to her primary care providers. Counseled patient on need to  stop. 02/16/15: Feels better. More with it. Complains about pain and swelling of the right arm at IV site which symptoms infiltrated. Doing better respiratory wise with no distress. Will discontinue oxygen, change antibiotics to oral, discontinue IV fluids and mobilize as possible. Likely d/c home tomorrow if she continues to do well. 02/17/15: Patient had vomiting last night. KUB this morning shows constipation, no bowel obstruction. Not sure what caused vomiting. Renal function a little bit worse, likely resulting from dehydration. We'll continue with IV fluids, current antibiotics and give lactulose for constipation. Discussed care plan with patient's sister at the bedside. Patient will likely d/c home tomorrow if she does well. Problem List Plan  Principal Problem:   Encephalopathy, metabolic Active Problems:   HTN (hypertension), benign   Cocaine abuse   AKI (acute kidney injury) ([place])   UTI (lower urinary tract infection)   COPD (chronic obstructive pulmonary disease) ([facility])   GERD (gastroesophageal reflux disease)   Dehydration   CAP (community acquired pneumonia)   Lactulose  Continue Keflex/Zithromax   Continue normal 7   Code Status: Full Code Family Communication: Sister at bedisde Disposition Plan: ?Home tomorrow  Consultants:  None Procedures:  None Antibiotics:  Ceftriaxone 02/14/15>>02/16/15  Azithromycin 02/15/15>>  Keflex 02/16/15>>  HPI/Subjective: No specific complaints  Objective: Filed Vitals:   02/17/15 0610 02/17/15 0900  BP: 117/69 123/62  Pulse: 86 85  Temp: 98.6 F (37 C) 98.4 F (36.9 C)  Resp: 14 15    Intake/Output Summary (Last 24 hours) at 02/17/15 1053 Last data filed at 02/17/15 0943  Gross per 24 hour  Intake    720  ml  Output    240 ml  Net    480 ml   Filed Weights   02/15/15 0600  Weight: 85.3 kg (188 lb 0.8 oz)    Exam:   General:  Comfortable at rest.  Cardiovascular: S1-S2 normal. No murmurs. Pulse  regular.  Respiratory: Good air entry bilaterally. No rhonchi or rales.  Abdomen: Soft and nontender. Normal bowel sounds. No organomegaly.  Musculoskeletal: No pedal edema   Neurological: Intact  Data Reviewed: Basic Metabolic Panel:  Recent Labs Lab 02/14/15 1035 02/14/15 2218 02/15/15 0350 02/16/15 0617 02/17/15 0646  NA 140  --  138 137 137  K 3.9  --  5.1 4.0 4.0  CL 105  --  111 104 105  CO2 22  --  22 25 20*  GLUCOSE 83  --  122* 94 113*  BUN 29*  --  24* 26* 33*  CREATININE 1.47*  --  1.38* 1.29* 1.41*  CALCIUM 9.0  --  8.4* 8.4* 8.2*  MG  --  2.0  --   --   --   PHOS  --  3.2  --   --   --    Liver Function Tests:  Recent Labs Lab 02/14/15 1035 02/15/15 0350 02/16/15 0617 02/17/15 0646  AST 18 13* 14* 17  ALT 15 12* 11* 12*  ALKPHOS 100 88 83 90  BILITOT 0.7 0.3 0.2* 0.4  PROT 8.3* 6.6 7.0 6.8  ALBUMIN 3.8 3.0* 3.3* 3.1*    Recent Labs Lab 02/14/15 1035  LIPASE 25   No results for input(s): AMMONIA in the last 168 hours. CBC:  Recent Labs Lab 02/14/15 1157 02/15/15 0350 02/16/15 0617 02/17/15 0646  WBC 10.9* 7.6 6.6 10.0  NEUTROABS 7.4 5.6 3.2 5.4  HGB 14.9 12.2 12.4 13.0  HCT 45.4 37.8 37.8 39.4  MCV 89.7 89.8 89.6 88.1  PLT 352 355 373 398   Cardiac Enzymes: No results for input(s): CKTOTAL, CKMB, CKMBINDEX, TROPONINI in the last 168 hours. BNP (last 3 results)  Recent Labs  02/28/14 1646 10/31/14 1012  BNP 22.6 252.6*    ProBNP (last 3 results) No results for input(s): PROBNP in the last 8760 hours.  CBG: No results for input(s): GLUCAP in the last 168 hours.  Recent Results (from the past 240 hour(s))  Urine culture     Status: None   Collection Time: 02/14/15 12:06 PM  Result Value Ref Range Status   Specimen Description URINE, CLEAN CATCH  Final   Special Requests NONE  Final   Culture   Final    MULTIPLE SPECIES PRESENT, SUGGEST RECOLLECTION Performed at [facility]    Report Status 02/16/2015 FINAL   Final  MRSA PCR Screening     Status: None   Collection Time: 02/14/15  5:48 PM  Result Value Ref Range Status   MRSA by PCR NEGATIVE NEGATIVE Final    Comment:        The GeneXpert MRSA Assay (FDA approved for NASAL specimens only), is one component of a comprehensive MRSA colonization surveillance program. It is not intended to diagnose MRSA infection nor to guide or monitor treatment for MRSA infections.   Culture, blood (x 2)     Status: None (Preliminary result)   Collection Time: 02/14/15  6:21 PM  Result Value Ref Range Status   Specimen Description BLOOD LEFT HAND  Final   Special Requests IN PEDIATRIC BOTTLE  [facility]  Final   Culture   Final  NO GROWTH 2 DAYS Performed at [facility]    Report Status PENDING  Incomplete  Culture, blood (x 2)     Status: None (Preliminary result)   Collection Time: 02/14/15  6:21 PM  Result Value Ref Range Status   Specimen Description BLOOD LEFT HAND  Final   Special Requests BOTTLES DRAWN AEROBIC ONLY 5CC  Final   Culture   Final    NO GROWTH 2 DAYS Performed at [facility]    Report Status PENDING  Incomplete     Studies: No results found.  Scheduled Meds: . amLODipine  5 mg Oral Daily  . aspirin EC  81 mg Oral Daily  . atorvastatin  40 mg Oral q1800  . azithromycin  500 mg Oral Daily  . budesonide (PULMICORT) nebulizer solution  0.25 mg Nebulization BID  . cephALEXin  500 mg Oral 3 times per day  . oxybutynin  5 mg Oral BID  . pantoprazole  40 mg Oral Daily  . predniSONE  10 mg Oral Daily   Continuous Infusions: . sodium chloride 125 mL/hr at 02/17/15 0943     [facility]  [facility] Pager [phone]. If 7PM-7AM, please contact night-coverage at [email or web address], password [facility] 02/17/2015, 10:53 AM  LOS: 3 days

## 2015-02-18 LAB — CBC WITH DIFFERENTIAL/PLATELET
BASOS PCT: 0 %
Basophils Absolute: 0 10*3/uL (ref 0.0–0.1)
EOS ABS: 0.5 10*3/uL (ref 0.0–0.7)
EOS PCT: 5 %
HCT: 38.4 % (ref 36.0–46.0)
HEMOGLOBIN: 12.4 g/dL (ref 12.0–15.0)
Lymphocytes Relative: 32 %
Lymphs Abs: 3.3 10*3/uL (ref 0.7–4.0)
MCH: 29 pg (ref 26.0–34.0)
MCHC: 32.3 g/dL (ref 30.0–36.0)
MCV: 89.7 fL (ref 78.0–100.0)
MONOS PCT: 6 %
Monocytes Absolute: 0.6 10*3/uL (ref 0.1–1.0)
NEUTROS PCT: 57 %
Neutro Abs: 5.9 10*3/uL (ref 1.7–7.7)
PLATELETS: 395 10*3/uL (ref 150–400)
RBC: 4.28 MIL/uL (ref 3.87–5.11)
RDW: 14.4 % (ref 11.5–15.5)
WBC: 10.2 10*3/uL (ref 4.0–10.5)

## 2015-02-18 LAB — COMPREHENSIVE METABOLIC PANEL
ALBUMIN: 3 g/dL — AB (ref 3.5–5.0)
ALK PHOS: 80 U/L (ref 38–126)
ALT: 15 U/L (ref 14–54)
ANION GAP: 7 (ref 5–15)
AST: 16 U/L (ref 15–41)
BUN: 27 mg/dL — ABNORMAL HIGH (ref 6–20)
CALCIUM: 8.2 mg/dL — AB (ref 8.9–10.3)
CHLORIDE: 108 mmol/L (ref 101–111)
CO2: 22 mmol/L (ref 22–32)
Creatinine, Ser: 1.12 mg/dL — ABNORMAL HIGH (ref 0.44–1.00)
GFR calc non Af Amer: 52 mL/min — ABNORMAL LOW (ref >60)
GLUCOSE: 90 mg/dL (ref 65–99)
POTASSIUM: 3.9 mmol/L (ref 3.5–5.1)
SODIUM: 137 mmol/L (ref 135–145)
Total Bilirubin: 0.3 mg/dL (ref 0.3–1.2)
Total Protein: 6.3 g/dL — ABNORMAL LOW (ref 6.5–8.1)

## 2015-02-18 MED ORDER — DEXTROSE 5 % IV SOLN
1.0000 g | INTRAVENOUS | Status: AC
  Administered 2015-02-18 – 2015-02-19 (×2): 1 g via INTRAVENOUS
  Filled 2015-02-18 (×2): qty 10

## 2015-02-18 MED ORDER — METOCLOPRAMIDE HCL 5 MG/ML IJ SOLN
10.0000 mg | Freq: Once | INTRAMUSCULAR | Status: AC
  Administered 2015-02-18: 10 mg via INTRAVENOUS
  Filled 2015-02-18: qty 2
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
## 2015-02-19 DIAGNOSIS — R1110 Vomiting, unspecified: Secondary | ICD-10-CM | POA: Insufficient documentation

## 2015-02-19 DIAGNOSIS — J189 Pneumonia, unspecified organism: Secondary | ICD-10-CM

## 2015-02-19 DIAGNOSIS — K5900 Constipation, unspecified: Secondary | ICD-10-CM

## 2015-02-19 DIAGNOSIS — N390 Urinary tract infection, site not specified: Secondary | ICD-10-CM

## 2015-02-19 DIAGNOSIS — R112 Nausea with vomiting, unspecified: Secondary | ICD-10-CM

## 2015-02-19 DIAGNOSIS — G9341 Metabolic encephalopathy: Secondary | ICD-10-CM

## 2015-02-19 DIAGNOSIS — E860 Dehydration: Secondary | ICD-10-CM

## 2015-02-19 LAB — BASIC METABOLIC PANEL
Anion gap: 7 (ref 5–15)
BUN: 25 mg/dL — AB (ref 6–20)
CALCIUM: 8.6 mg/dL — AB (ref 8.9–10.3)
CO2: 23 mmol/L (ref 22–32)
CREATININE: 1.1 mg/dL — AB (ref 0.44–1.00)
Chloride: 108 mmol/L (ref 101–111)
GFR calc non Af Amer: 53 mL/min — ABNORMAL LOW (ref >60)
GLUCOSE: 86 mg/dL (ref 65–99)
Potassium: 4.3 mmol/L (ref 3.5–5.1)
Sodium: 138 mmol/L (ref 135–145)

## 2015-02-19 LAB — CBC WITH DIFFERENTIAL/PLATELET
BASOS PCT: 1 %
Basophils Absolute: 0.1 10*3/uL (ref 0.0–0.1)
Eosinophils Absolute: 0.5 10*3/uL (ref 0.0–0.7)
Eosinophils Relative: 4 %
HEMATOCRIT: 41.2 % (ref 36.0–46.0)
Hemoglobin: 13.1 g/dL (ref 12.0–15.0)
LYMPHS ABS: 3.4 10*3/uL (ref 0.7–4.0)
Lymphocytes Relative: 31 %
MCH: 28.9 pg (ref 26.0–34.0)
MCHC: 31.8 g/dL (ref 30.0–36.0)
MCV: 90.7 fL (ref 78.0–100.0)
MONO ABS: 0.7 10*3/uL (ref 0.1–1.0)
MONOS PCT: 6 %
NEUTROS ABS: 6.5 10*3/uL (ref 1.7–7.7)
Neutrophils Relative %: 58 %
Platelets: 439 10*3/uL — ABNORMAL HIGH (ref 150–400)
RBC: 4.54 MIL/uL (ref 3.87–5.11)
RDW: 14.5 % (ref 11.5–15.5)
WBC: 11.1 10*3/uL — ABNORMAL HIGH (ref 4.0–10.5)

## 2015-02-19 LAB — CULTURE, BLOOD (ROUTINE X 2)
Culture: NO GROWTH
Culture: NO GROWTH

## 2015-02-19 LAB — MAGNESIUM: Magnesium: 2 mg/dL (ref 1.7–2.4)

## 2015-02-19 MED ORDER — AMOXICILLIN-POT CLAVULANATE 875-125 MG PO TABS: 1.0000 | ORAL_TABLET | Freq: Two times a day (BID) | ORAL | Status: DC

## 2015-02-19 NOTE — Progress Notes (Signed)
[facility] PROGRESS NOTE  Gayatri Grimsrud [id number] DOB: 1955-04-10 DOA: 02/14/2015 PCP: Philis Fendt, MD  Brief interval history [name] is a 60 y.o. female with a past medical hospital hypertension, hyperlipidemia, rheumatoid arthritis, cocaine abuse, tobacco use disorder, COPD who is brought to the emergency department via EMS after eating found [place].   The patient stated earlier that she smoked crack cocaine last night and she has not slept in 48 hours. She had emesis last night and then developed lower abdominal pain today. She has not taken her antihypertensives or other medications. She did not provide further information. She has had similar admissions in the past due to cocaine abuse. I try to contact her sister, Ms. Raymon Mutton, and my phone call went to voicemail. I was unable to leave a message due to her voicemail being full.  Physical exam and workup in the emergency department, revealed the patient still confused she is only oriented to name and place, partially oriented to time. She also has lower abdominal pain. Workup reveals a negative CT scan, no new EKG changes, mild leukocytosis with an abnormal urinalysis.     Assessment/Plan: #1 acute metabolic encephalopathy Likely secondary to probable UTI/pneumonia in the setting of polysubstance abuse. Patient had presented with a productive cough and added leukocytosis. Patient was on IV Rocephin and azithromycin was subsequently transitioned to oral azithromycin. However due to nausea and vomiting antibiotics were changed back to IV. Patient with clinical improvement and likely at baseline.  #2 probable pneumonia in the setting of polysubstance abuse Patient was on IV Rocephin and azithromycin on admission and was transitioned to oral azithromycin however patient had complaints of nausea and vomiting and subsequently these were discontinued. Will place patient on oral Augmentin to complete  course of antibiotic treatment.  #3 probable UTI versus bacteriuria Urine cultures negative. Patient was on IV Rocephin. Follow.  #4 nausea vomiting secondary to constipation Likely secondary to constipation. CT abdomen and pelvis negative. Abdominal x-ray negative for any acute abnormalities and consistent with large stool burden. Soapsuds enema. Place on MiraLAX daily. Senokot-S at bedtime. Follow.  #5 dehydration IV fluids.  #6 cocaine abuse Social work consulted.  #7 hypertension Stable. Continue Norvasc.  #8 tobacco abuse Tobacco cessation. Nicotine patch.  #10 prophylaxis PPI for GI prophylaxis. SCDs for DVT prophylaxis.  Code Status: Full Family Communication: Updated patient. No family present. Disposition Plan: Home when medically stable and nausea vomiting have improved hopefully 1-2 days.   Consultants:  None  Procedures:  CT abdomen and pelvis 02/14/2015  CT head 02/14/2015  abdominal x-ray 02/17/2015  Chest x-ray 02/14/2015  Antibiotics:  IV azithromycin 02/15/2015>>>> 02/16/2015  Oral azithromycin 02/17/2015>>>>> 02/18/2015  IV Rocephin 02/14/2015>>>> 02/19/2015  IV Keflex 02/16/2015>>>> 02/18/2015  HPI/Subjective: Patient complaining of nausea and some emesis this morning however also states she was able to tolerate her diet and keep her food down. Patient stated that she just received soapsuds enema. Patient complaining of her chronic left arm pain that she is supposed to follow-up with neurology with as outpatient. Patient denies any shortness of breath. No chest pain.  Objective: Filed Vitals:   02/18/15 2113 02/19/15 0402  BP: 134/74 129/62  Pulse: 82 67  Temp: 97.8 F (36.6 C) 97.8 F (36.6 C)  Resp: 20 20    Intake/Output Summary (Last 24 hours) at 02/19/15 1351 Last data filed at 02/19/15 0631  Gross per 24 hour  Intake 1752.5 ml  Output  0 ml  Net 1752.5 ml   Filed Weights   02/15/15 0600  Weight: 85.3 kg (188 lb  0.8 oz)    Exam:   General:  NAD  Cardiovascular: RRR  Respiratory: CTAB  Abdomen: Soft, mildly distended, some tenderness to palpation, positive bowel sounds.   Musculoskeletal: No clubbing cyanosis or edema.  Data Reviewed: Basic Metabolic Panel:  Recent Labs Lab 02/14/15 2218 02/15/15 0350 02/16/15 0617 02/17/15 0646 02/18/15 0611 02/19/15 0623  NA  --  138 137 137 137 138  K  --  5.1 4.0 4.0 3.9 4.3  CL  --  111 104 105 108 108  CO2  --  22 25 20* 22 23  GLUCOSE  --  122* 94 113* 90 86  BUN  --  24* 26* 33* 27* 25*  CREATININE  --  1.38* 1.29* 1.41* 1.12* 1.10*  CALCIUM  --  8.4* 8.4* 8.2* 8.2* 8.6*  MG 2.0  --   --   --   --  2.0  PHOS 3.2  --   --   --   --   --    Liver Function Tests:  Recent Labs Lab 02/14/15 1035 02/15/15 0350 02/16/15 0617 02/17/15 0646 02/18/15 0611  AST 18 13* 14* 17 16  ALT 15 12* 11* 12* 15  ALKPHOS 100 88 83 90 80  BILITOT 0.7 0.3 0.2* 0.4 0.3  PROT 8.3* 6.6 7.0 6.8 6.3*  ALBUMIN 3.8 3.0* 3.3* 3.1* 3.0*    Recent Labs Lab 02/14/15 1035  LIPASE 25   No results for input(s): AMMONIA in the last 168 hours. CBC:  Recent Labs Lab 02/15/15 0350 02/16/15 0617 02/17/15 0646 02/18/15 0611 02/19/15 0623  WBC 7.6 6.6 10.0 10.2 11.1*  NEUTROABS 5.6 3.2 5.4 5.9 6.5  HGB 12.2 12.4 13.0 12.4 13.1  HCT 37.8 37.8 39.4 38.4 41.2  MCV 89.8 89.6 88.1 89.7 90.7  PLT 355 373 398 395 439*   Cardiac Enzymes: No results for input(s): CKTOTAL, CKMB, CKMBINDEX, TROPONINI in the last 168 hours. BNP (last 3 results)  Recent Labs  02/28/14 1646 10/31/14 1012  BNP 22.6 252.6*    ProBNP (last 3 results) No results for input(s): PROBNP in the last 8760 hours.  CBG: No results for input(s): GLUCAP in the last 168 hours.  Recent Results (from the past 240 hour(s))  Urine culture     Status: None   Collection Time: 02/14/15 12:06 PM  Result Value Ref Range Status   Specimen Description URINE, CLEAN CATCH  Final   Special  Requests NONE  Final   Culture   Final    MULTIPLE SPECIES PRESENT, SUGGEST RECOLLECTION Performed at [facility]    Report Status 02/16/2015 FINAL  Final  MRSA PCR Screening     Status: None   Collection Time: 02/14/15  5:48 PM  Result Value Ref Range Status   MRSA by PCR NEGATIVE NEGATIVE Final    Comment:        The GeneXpert MRSA Assay (FDA approved for NASAL specimens only), is one component of a comprehensive MRSA colonization surveillance program. It is not intended to diagnose MRSA infection nor to guide or monitor treatment for MRSA infections.   Culture, blood (x 2)     Status: None (Preliminary result)   Collection Time: 02/14/15  6:21 PM  Result Value Ref Range Status   Specimen Description BLOOD LEFT HAND  Final   Special Requests IN PEDIATRIC BOTTLE  0.5CC  Final  Culture   Final    NO GROWTH 4 DAYS Performed at [facility]    Report Status PENDING  Incomplete  Culture, blood (x 2)     Status: None (Preliminary result)   Collection Time: 02/14/15  6:21 PM  Result Value Ref Range Status   Specimen Description BLOOD LEFT HAND  Final   Special Requests BOTTLES DRAWN AEROBIC ONLY 5CC  Final   Culture   Final    NO GROWTH 4 DAYS Performed at [facility]    Report Status PENDING  Incomplete     Studies: No results found.  Scheduled Meds: . amLODipine  5 mg Oral Daily  . aspirin EC  81 mg Oral Daily  . budesonide (PULMICORT) nebulizer solution  0.25 mg Nebulization BID  . cefTRIAXone (ROCEPHIN)  IV  1 g Intravenous Q24H  . nicotine  21 mg Transdermal Daily  . oxybutynin  5 mg Oral BID  . pantoprazole  40 mg Oral Daily  . predniSONE  10 mg Oral Daily   Continuous Infusions: . sodium chloride 50 mL/hr at 02/18/15 2203    Principal Problem:   Encephalopathy, metabolic Active Problems:   HTN (hypertension), benign   Cocaine abuse   AKI (acute kidney injury) ([place])   UTI (lower urinary tract infection)   COPD (chronic  obstructive pulmonary disease) (HCC)   GERD (gastroesophageal reflux disease)   Dehydration   CAP (community acquired pneumonia)   Constipation    Time spent: 67 minutes    THOMPSON,DANIEL M.D. [facility] Pager [phone]. If 7PM-7AM, please contact night-coverage at [email or web address], password [facility] 02/19/2015, 1:51 PM  LOS: 5 days

## 2015-02-20 DIAGNOSIS — N3000 Acute cystitis without hematuria: Secondary | ICD-10-CM

## 2015-02-20 DIAGNOSIS — I10 Essential (primary) hypertension: Secondary | ICD-10-CM

## 2015-02-20 LAB — CBC WITH DIFFERENTIAL/PLATELET
BASOS PCT: 0 %
Basophils Absolute: 0 10*3/uL (ref 0.0–0.1)
EOS ABS: 0.4 10*3/uL (ref 0.0–0.7)
EOS PCT: 4 %
HEMATOCRIT: 42.6 % (ref 36.0–46.0)
Hemoglobin: 13.5 g/dL (ref 12.0–15.0)
Lymphocytes Relative: 36 %
Lymphs Abs: 3.5 10*3/uL (ref 0.7–4.0)
MCH: 28.8 pg (ref 26.0–34.0)
MCHC: 31.7 g/dL (ref 30.0–36.0)
MCV: 91 fL (ref 78.0–100.0)
MONO ABS: 0.5 10*3/uL (ref 0.1–1.0)
MONOS PCT: 5 %
Neutro Abs: 5.3 10*3/uL (ref 1.7–7.7)
Neutrophils Relative %: 55 %
PLATELETS: 440 10*3/uL — AB (ref 150–400)
RBC: 4.68 MIL/uL (ref 3.87–5.11)
RDW: 14.4 % (ref 11.5–15.5)
WBC: 9.7 10*3/uL (ref 4.0–10.5)

## 2015-02-20 LAB — BASIC METABOLIC PANEL
Anion gap: 7 (ref 5–15)
BUN: 23 mg/dL — ABNORMAL HIGH (ref 6–20)
CALCIUM: 8.8 mg/dL — AB (ref 8.9–10.3)
CO2: 27 mmol/L (ref 22–32)
CREATININE: 1.2 mg/dL — AB (ref 0.44–1.00)
Chloride: 103 mmol/L (ref 101–111)
GFR calc non Af Amer: 48 mL/min — ABNORMAL LOW (ref >60)
GFR, EST AFRICAN AMERICAN: 55 mL/min — AB (ref >60)
Glucose, Bld: 109 mg/dL — ABNORMAL HIGH (ref 65–99)
Potassium: 4.1 mmol/L (ref 3.5–5.1)
SODIUM: 137 mmol/L (ref 135–145)

## 2015-02-20 MED ORDER — CLOTRIMAZOLE 1 % VA CREA
1.0000 | TOPICAL_CREAM | Freq: Every day | VAGINAL | Status: DC
  Filled 2015-02-20: qty 45

## 2015-02-20 MED ORDER — DIPHENHYDRAMINE HCL 25 MG PO CAPS
25.0000 mg | ORAL_CAPSULE | ORAL | Status: DC | PRN
  Administered 2015-02-20: 25 mg via ORAL
  Filled 2015-02-20: qty 1

## 2015-02-20 MED ORDER — FLUCONAZOLE IN SODIUM CHLORIDE 200-0.9 MG/100ML-% IV SOLN
150.0000 mg | Freq: Once | INTRAVENOUS | Status: AC
  Administered 2015-02-20: 150 mg via INTRAVENOUS
  Filled 2015-02-20: qty 75

## 2015-02-20 MED ORDER — DIPHENHYDRAMINE HCL 50 MG PO CAPS
50.0000 mg | ORAL_CAPSULE | Freq: Once | ORAL | Status: AC
  Administered 2015-02-20: 50 mg via ORAL
  Filled 2015-02-20: qty 1

## 2015-02-20 MED ORDER — NICOTINE 21 MG/24HR TD PT24: 21.0000 mg | MEDICATED_PATCH | Freq: Every day | TRANSDERMAL | Status: DC

## 2015-02-20 MED ORDER — FLUCONAZOLE IN SODIUM CHLORIDE 200-0.9 MG/100ML-% IV SOLN: 150.0000 mg | INTRAVENOUS | Status: DC

## 2015-02-20 MED ORDER — CLOTRIMAZOLE 1 % VA CREA: 1.0000 | TOPICAL_CREAM | Freq: Every day | VAGINAL | Status: DC

## 2015-02-20 NOTE — Progress Notes (Signed)
  Shift report given to [place], Therapist, sports.
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
## 2015-02-20 NOTE — Progress Notes (Signed)
Date: February 20, 2015 Chart reviewed for concurrent status and case management needs. Will continue to follow patient for changes and needs: poor po intake and iv lfds Velva Harman, BSN, Therapist, sports, [place]   [phone]
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
## 2015-02-20 NOTE — Plan of Care (Signed)
Problem: Bowel/Gastric: Goal: Will not experience complications related to bowel motility Outcome: Completed/Met Date Met:  02/20/15 Pt had BM 2-8, according to Gershon Mussel, RN during shift report.

## 2015-02-20 NOTE — Progress Notes (Signed)
Pt discharged from the unit via wheelchair. Discharge instructions were reviewed with the pt. Pt unable to read and was verbally explained the details of her discharge instructions. Pt belongings were sent home with pt. No questions or concerns at this time.  Altan Kraai W Gayle Martinez, RN

## 2015-02-28 ENCOUNTER — Other Ambulatory Visit: Payer: Self-pay | Admitting: Sports Medicine

## 2015-02-28 DIAGNOSIS — M542 Cervicalgia: Secondary | ICD-10-CM

## 2015-03-04 ENCOUNTER — Other Ambulatory Visit: Payer: Medicaid Other

## 2015-03-06 ENCOUNTER — Ambulatory Visit
Admission: RE | Admit: 2015-03-06 | Discharge: 2015-03-06 | Disposition: A | Discharge status: Home / Self Care | Payer: Medicaid Other | Source: Ambulatory Visit | Attending: Sports Medicine | Admitting: Sports Medicine

## 2015-03-06 DIAGNOSIS — M542 Cervicalgia: Secondary | ICD-10-CM

## 2015-03-13 ENCOUNTER — Other Ambulatory Visit: Payer: Medicaid Other

## 2015-03-17 ENCOUNTER — Other Ambulatory Visit: Payer: Self-pay | Admitting: Neurological Surgery
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
## 2015-04-08 NOTE — Pre-Procedure Instructions (Signed)
[name]  04/08/2015     Your procedure is scheduled on : Thursday April 17, 2015 at 9:47 AM.  Report to [id number] Admitting at 7:45 AM.  Call this number if you have problems the morning of surgery: [phone]    Remember:  Do not eat food or drink liquids after midnight.  Take these medicines the morning of surgery with A SIP OF WATER : Albuterol inhaler if needed (bring inhaler with you), Amlodipine (Norvasc), Qvar inhaler, Oxybutynin (Ditropan), Oxycodone if needed, Pantoprazole (Protonix), Prednisone (Deltasone)   Stop taking any vitamins, herbal medications/supplements, NSAIDs, Ibuprofen, Advil, Motrin, Aleve/Naproxen, etc on Thursday March 30th   Do not wear jewelry, make-up or nail polish.  Do not wear lotions, powders, or perfumes.    Do not shave 48 hours prior to surgery.    Do not bring valuables to the hospital.  [facility] is not responsible for any belongings or valuables.  Contacts, dentures or bridgework may not be worn into surgery.  Leave your suitcase in the car.  After surgery it may be brought to your room.  For patients admitted to the hospital, discharge time will be determined by your treatment team.  Patients discharged the day of surgery will not be allowed to drive home.   Name and phone number of your driver:    Special instructions:  Shower using CHG soap the night before and the morning of your surgery  Please read over the following fact sheets that you were given. Pain Booklet, Coughing and Deep Breathing, MRSA Information and Surgical Site Infection Prevention

## 2015-04-09 ENCOUNTER — Encounter (HOSPITAL_COMMUNITY)
Admission: RE | Admit: 2015-04-09 | Discharge: 2015-04-09 | Disposition: A | Discharge status: Home / Self Care | Payer: Medicaid Other | Source: Ambulatory Visit | Attending: Vascular Surgery | Admitting: Vascular Surgery

## 2015-04-09 ENCOUNTER — Encounter (HOSPITAL_COMMUNITY): Payer: Self-pay

## 2015-04-09 ENCOUNTER — Encounter (HOSPITAL_COMMUNITY)
Admission: RE | Admit: 2015-04-09 | Discharge: 2015-04-09 | Disposition: A | Discharge status: Home / Self Care | Payer: Medicaid Other | Source: Ambulatory Visit | Attending: Neurological Surgery | Admitting: Neurological Surgery

## 2015-04-09 DIAGNOSIS — J849 Interstitial pulmonary disease, unspecified: Secondary | ICD-10-CM | POA: Insufficient documentation

## 2015-04-09 DIAGNOSIS — M069 Rheumatoid arthritis, unspecified: Secondary | ICD-10-CM | POA: Insufficient documentation

## 2015-04-09 DIAGNOSIS — K219 Gastro-esophageal reflux disease without esophagitis: Secondary | ICD-10-CM | POA: Diagnosis not present

## 2015-04-09 DIAGNOSIS — I10 Essential (primary) hypertension: Secondary | ICD-10-CM | POA: Insufficient documentation

## 2015-04-09 DIAGNOSIS — M542 Cervicalgia: Secondary | ICD-10-CM | POA: Insufficient documentation

## 2015-04-09 DIAGNOSIS — Z8701 Personal history of pneumonia (recurrent): Secondary | ICD-10-CM | POA: Diagnosis not present

## 2015-04-09 DIAGNOSIS — F17200 Nicotine dependence, unspecified, uncomplicated: Secondary | ICD-10-CM | POA: Diagnosis not present

## 2015-04-09 DIAGNOSIS — Z01812 Encounter for preprocedural laboratory examination: Secondary | ICD-10-CM | POA: Insufficient documentation

## 2015-04-09 DIAGNOSIS — Z22322 Carrier or suspected carrier of Methicillin resistant Staphylococcus aureus: Secondary | ICD-10-CM

## 2015-04-09 DIAGNOSIS — I517 Cardiomegaly: Secondary | ICD-10-CM | POA: Diagnosis not present

## 2015-04-09 DIAGNOSIS — Z79899 Other long term (current) drug therapy: Secondary | ICD-10-CM | POA: Diagnosis not present

## 2015-04-09 DIAGNOSIS — Z01818 Encounter for other preprocedural examination: Secondary | ICD-10-CM | POA: Insufficient documentation

## 2015-04-09 HISTORY — DX: Constipation, unspecified: K59.00

## 2015-04-09 HISTORY — DX: Unspecified urinary incontinence: R32

## 2015-04-09 HISTORY — DX: Gastro-esophageal reflux disease without esophagitis: K21.9

## 2015-04-09 HISTORY — DX: Personal history of urinary calculi: Z87.442

## 2015-04-09 HISTORY — DX: Pneumonia, unspecified organism: J18.9

## 2015-04-09 HISTORY — DX: Reserved for inherently not codable concepts without codable children: IMO0001

## 2015-04-09 HISTORY — DX: Chronic obstructive pulmonary disease, unspecified: J44.9

## 2015-04-09 HISTORY — DX: Carrier or suspected carrier of methicillin resistant Staphylococcus aureus: Z22.322

## 2015-04-09 LAB — COMPREHENSIVE METABOLIC PANEL
ALT: 15 U/L (ref 14–54)
ANION GAP: 9 (ref 5–15)
AST: 18 U/L (ref 15–41)
Albumin: 3.5 g/dL (ref 3.5–5.0)
Alkaline Phosphatase: 95 U/L (ref 38–126)
BILIRUBIN TOTAL: 0.5 mg/dL (ref 0.3–1.2)
BUN: 20 mg/dL (ref 6–20)
CHLORIDE: 109 mmol/L (ref 101–111)
CO2: 22 mmol/L (ref 22–32)
Calcium: 9 mg/dL (ref 8.9–10.3)
Creatinine, Ser: 1.14 mg/dL — ABNORMAL HIGH (ref 0.44–1.00)
GFR, EST AFRICAN AMERICAN: 59 mL/min — AB (ref >60)
GFR, EST NON AFRICAN AMERICAN: 51 mL/min — AB (ref >60)
Glucose, Bld: 123 mg/dL — ABNORMAL HIGH (ref 65–99)
POTASSIUM: 4.6 mmol/L (ref 3.5–5.1)
Sodium: 140 mmol/L (ref 135–145)
TOTAL PROTEIN: 7 g/dL (ref 6.5–8.1)

## 2015-04-09 LAB — SURGICAL PCR SCREEN
MRSA, PCR: POSITIVE — AB
STAPHYLOCOCCUS AUREUS: POSITIVE — AB

## 2015-04-09 LAB — CBC
HCT: 43.6 % (ref 36.0–46.0)
Hemoglobin: 14.4 g/dL (ref 12.0–15.0)
MCH: 29.4 pg (ref 26.0–34.0)
MCHC: 33 g/dL (ref 30.0–36.0)
MCV: 89 fL (ref 78.0–100.0)
PLATELETS: 335 10*3/uL (ref 150–400)
RBC: 4.9 MIL/uL (ref 3.87–5.11)
RDW: 14.1 % (ref 11.5–15.5)
WBC: 5.3 10*3/uL (ref 4.0–10.5)

## 2015-04-09 NOTE — Progress Notes (Signed)
Nurse called prescription for Mupirocin into [facility] pharmacy, and then called patient and informed her of positive PCR results. Patient verbalized understanding.
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
## 2015-04-09 NOTE — Progress Notes (Signed)
Patient arrived to 1400 PAT appointment at 1045  PCP is Nolene Ebbs  Cardiologist is Mertie Moores  Manual blood pressure on arrival to PAT was 162/102. Patient stated she had not taken her blood pressure medicines today because she thought her appointment was at [facility], and she had not eaten today. Patient denied having any current cardiac or pulmonary issues at this time.  Patient stated she last used her Albuterol inhaler "the other day." Patient recently was diagnosed with pneumonia and informed Nurse that she still has a cough, and coughed up a small amount of blood tinged sputum on Saturday. Patient denied having any fever or chills.   Nurse inquired about recreational drug usage, and patient informed Nurse that she last used Cocaine "either Friday or Saturday." Nurse explained to patient the importance of not using any recreational drugs before upcoming surgery. Patient stated "I'm staying at my sister's house and she's not gon let me go to town....she lives way out in [place], and I'm staying with her...Marland KitchenMarland KitchenI swear to God that I'm not going to use any more before my surgery because I had a friend who died after having surgery because she did not tell them that she was using Cocaine....she never came out of it."   Nurse informed Ebony Hail, [place] of this and Ebony Hail stated that patient needed to have a repeat chest xray, and that she would need to see patient before leaving PAT. Patient informed of this, and verbalized understanding.

## 2015-04-17 ENCOUNTER — Ambulatory Visit (HOSPITAL_COMMUNITY): Payer: Medicaid Other | Admitting: Certified Registered Nurse Anesthetist

## 2015-04-17 ENCOUNTER — Ambulatory Visit (HOSPITAL_COMMUNITY): Payer: Medicaid Other

## 2015-04-17 ENCOUNTER — Encounter (HOSPITAL_COMMUNITY)
Admission: RE | Disposition: A | Discharge status: Home / Self Care | Payer: Self-pay | Source: Ambulatory Visit | Attending: Neurological Surgery

## 2015-04-17 ENCOUNTER — Encounter (HOSPITAL_COMMUNITY): Payer: Self-pay | Admitting: *Deleted

## 2015-04-17 ENCOUNTER — Ambulatory Visit (HOSPITAL_COMMUNITY)
Admission: RE | Admit: 2015-04-17 | Discharge: 2015-04-18 | Disposition: A | Discharge status: Home / Self Care | Payer: Medicaid Other | Source: Ambulatory Visit | Attending: Neurological Surgery | Admitting: Neurological Surgery

## 2015-04-17 ENCOUNTER — Ambulatory Visit (HOSPITAL_COMMUNITY): Payer: Medicaid Other | Admitting: Vascular Surgery

## 2015-04-17 DIAGNOSIS — Z419 Encounter for procedure for purposes other than remedying health state, unspecified: Secondary | ICD-10-CM

## 2015-04-17 DIAGNOSIS — M50223 Other cervical disc displacement at C6-C7 level: Secondary | ICD-10-CM | POA: Insufficient documentation

## 2015-04-17 DIAGNOSIS — F17210 Nicotine dependence, cigarettes, uncomplicated: Secondary | ICD-10-CM | POA: Insufficient documentation

## 2015-04-17 DIAGNOSIS — Z87891 Personal history of nicotine dependence: Secondary | ICD-10-CM | POA: Diagnosis not present

## 2015-04-17 DIAGNOSIS — Z981 Arthrodesis status: Secondary | ICD-10-CM

## 2015-04-17 DIAGNOSIS — M069 Rheumatoid arthritis, unspecified: Secondary | ICD-10-CM | POA: Insufficient documentation

## 2015-04-17 DIAGNOSIS — J449 Chronic obstructive pulmonary disease, unspecified: Secondary | ICD-10-CM | POA: Insufficient documentation

## 2015-04-17 DIAGNOSIS — K219 Gastro-esophageal reflux disease without esophagitis: Secondary | ICD-10-CM | POA: Insufficient documentation

## 2015-04-17 DIAGNOSIS — I10 Essential (primary) hypertension: Secondary | ICD-10-CM | POA: Diagnosis not present

## 2015-04-17 DIAGNOSIS — M47812 Spondylosis without myelopathy or radiculopathy, cervical region: Secondary | ICD-10-CM | POA: Diagnosis present

## 2015-04-17 HISTORY — PX: ANTERIOR CERVICAL DECOMP/DISCECTOMY FUSION: SHX1161

## 2015-04-17 SURGERY — ANTERIOR CERVICAL DECOMPRESSION/DISCECTOMY FUSION 2 LEVELS
Anesthesia: General | Site: Neck

## 2015-04-17 MED ORDER — BUPIVACAINE HCL (PF) 0.25 % IJ SOLN
INTRAMUSCULAR | Status: DC | PRN
  Administered 2015-04-17: 6 mL

## 2015-04-17 MED ORDER — LIDOCAINE HCL (CARDIAC) 20 MG/ML IV SOLN
INTRAVENOUS | Status: AC
  Filled 2015-04-17: qty 5

## 2015-04-17 MED ORDER — HYDROMORPHONE HCL 1 MG/ML IJ SOLN
INTRAMUSCULAR | Status: AC
  Filled 2015-04-17: qty 1

## 2015-04-17 MED ORDER — FENTANYL CITRATE (PF) 250 MCG/5ML IJ SOLN
INTRAMUSCULAR | Status: AC
  Filled 2015-04-17: qty 5

## 2015-04-17 MED ORDER — POTASSIUM CHLORIDE IN NACL 20-0.9 MEQ/L-% IV SOLN
INTRAVENOUS | Status: DC
  Filled 2015-04-17 (×3): qty 1000

## 2015-04-17 MED ORDER — 0.9 % SODIUM CHLORIDE (POUR BTL) OPTIME
TOPICAL | Status: DC | PRN
  Administered 2015-04-17: 1000 mL

## 2015-04-17 MED ORDER — SODIUM CHLORIDE 0.9% FLUSH
3.0000 mL | Freq: Two times a day (BID) | INTRAVENOUS | Status: DC
  Administered 2015-04-17: 3 mL via INTRAVENOUS

## 2015-04-17 MED ORDER — MENTHOL 3 MG MT LOZG
1.0000 | LOZENGE | OROMUCOSAL | Status: DC | PRN
  Administered 2015-04-17: 3 mg via ORAL
  Filled 2015-04-17: qty 9

## 2015-04-17 MED ORDER — METHOCARBAMOL 1000 MG/10ML IJ SOLN: 500.0000 mg | Freq: Four times a day (QID) | INTRAVENOUS | Status: DC | PRN

## 2015-04-17 MED ORDER — PREDNISONE 10 MG PO TABS
10.0000 mg | ORAL_TABLET | Freq: Every day | ORAL | Status: DC
  Administered 2015-04-17 – 2015-04-18 (×2): 10 mg via ORAL
  Filled 2015-04-17 (×2): qty 1

## 2015-04-17 MED ORDER — MEPERIDINE HCL 25 MG/ML IJ SOLN: 6.2500 mg | INTRAMUSCULAR | Status: DC | PRN

## 2015-04-17 MED ORDER — SUGAMMADEX SODIUM 500 MG/5ML IV SOLN
INTRAVENOUS | Status: DC | PRN
  Administered 2015-04-17: 200 mg via INTRAVENOUS

## 2015-04-17 MED ORDER — THROMBIN 5000 UNITS EX SOLR
OROMUCOSAL | Status: DC | PRN
  Administered 2015-04-17: 10:00:00 via TOPICAL

## 2015-04-17 MED ORDER — OXYBUTYNIN CHLORIDE 5 MG PO TABS
5.0000 mg | ORAL_TABLET | Freq: Two times a day (BID) | ORAL | Status: DC
  Administered 2015-04-17 – 2015-04-18 (×2): 5 mg via ORAL
  Filled 2015-04-17 (×2): qty 1

## 2015-04-17 MED ORDER — MORPHINE SULFATE (PF) 2 MG/ML IV SOLN: 1.0000 mg | INTRAVENOUS | Status: DC | PRN

## 2015-04-17 MED ORDER — MIDAZOLAM HCL 5 MG/5ML IJ SOLN
INTRAMUSCULAR | Status: DC | PRN
  Administered 2015-04-17: 2 mg via INTRAVENOUS

## 2015-04-17 MED ORDER — PHENYLEPHRINE HCL 10 MG/ML IJ SOLN
10.0000 mg | INTRAVENOUS | Status: DC | PRN
  Administered 2015-04-17: 30 ug/min via INTRAVENOUS

## 2015-04-17 MED ORDER — VANCOMYCIN HCL IN DEXTROSE 1-5 GM/200ML-% IV SOLN
1000.0000 mg | Freq: Once | INTRAVENOUS | Status: AC
  Administered 2015-04-17: 1000 mg via INTRAVENOUS
  Filled 2015-04-17: qty 200

## 2015-04-17 MED ORDER — BUDESONIDE 0.25 MG/2ML IN SUSP
0.2500 mg | Freq: Two times a day (BID) | RESPIRATORY_TRACT | Status: DC
  Administered 2015-04-17 – 2015-04-18 (×2): 0.25 mg via RESPIRATORY_TRACT
  Filled 2015-04-17 (×2): qty 2

## 2015-04-17 MED ORDER — ALBUTEROL SULFATE (2.5 MG/3ML) 0.083% IN NEBU
2.5000 mg | INHALATION_SOLUTION | Freq: Four times a day (QID) | RESPIRATORY_TRACT | Status: DC | PRN
Start: 2015-04-17 — End: 2015-04-18

## 2015-04-17 MED ORDER — ACETAMINOPHEN 650 MG RE SUPP: 650.0000 mg | RECTAL | Status: DC | PRN

## 2015-04-17 MED ORDER — ONDANSETRON HCL 4 MG/2ML IJ SOLN: 4.0000 mg | INTRAMUSCULAR | Status: DC | PRN

## 2015-04-17 MED ORDER — HYDROMORPHONE HCL 1 MG/ML IJ SOLN
0.2500 mg | INTRAMUSCULAR | Status: DC | PRN
  Administered 2015-04-17: 0.25 mg via INTRAVENOUS
  Administered 2015-04-17: 0.5 mg via INTRAVENOUS

## 2015-04-17 MED ORDER — AMITRIPTYLINE HCL 50 MG PO TABS
50.0000 mg | ORAL_TABLET | Freq: Every day | ORAL | Status: DC
  Administered 2015-04-17: 50 mg via ORAL
  Filled 2015-04-17: qty 1

## 2015-04-17 MED ORDER — PHENOL 1.4 % MT LIQD
1.0000 | OROMUCOSAL | Status: DC | PRN
  Filled 2015-04-17: qty 177

## 2015-04-17 MED ORDER — METHOCARBAMOL 500 MG PO TABS
500.0000 mg | ORAL_TABLET | Freq: Four times a day (QID) | ORAL | Status: DC | PRN
  Administered 2015-04-17 – 2015-04-18 (×3): 500 mg via ORAL
  Filled 2015-04-17 (×3): qty 1

## 2015-04-17 MED ORDER — PANTOPRAZOLE SODIUM 40 MG PO TBEC
40.0000 mg | DELAYED_RELEASE_TABLET | Freq: Every day | ORAL | Status: DC
  Administered 2015-04-17 – 2015-04-18 (×2): 40 mg via ORAL
  Filled 2015-04-17 (×2): qty 1

## 2015-04-17 MED ORDER — SODIUM CHLORIDE 0.9 % IV SOLN: 250.0000 mL | INTRAVENOUS | Status: DC

## 2015-04-17 MED ORDER — SODIUM CHLORIDE 0.9 % IR SOLN
Status: DC | PRN
  Administered 2015-04-17: 10:00:00

## 2015-04-17 MED ORDER — THROMBIN 5000 UNITS EX SOLR
CUTANEOUS | Status: DC | PRN
  Administered 2015-04-17 (×2): 5000 [IU] via TOPICAL

## 2015-04-17 MED ORDER — HEMOSTATIC AGENTS (NO CHARGE) OPTIME
TOPICAL | Status: DC | PRN
  Administered 2015-04-17: 1 via TOPICAL

## 2015-04-17 MED ORDER — OXYCODONE-ACETAMINOPHEN 5-325 MG PO TABS
1.0000 | ORAL_TABLET | ORAL | Status: DC | PRN
  Administered 2015-04-17 – 2015-04-18 (×4): 2 via ORAL
  Filled 2015-04-17 (×4): qty 2

## 2015-04-17 MED ORDER — ACETAMINOPHEN 325 MG PO TABS: 650.0000 mg | ORAL_TABLET | ORAL | Status: DC | PRN

## 2015-04-17 MED ORDER — LISINOPRIL 20 MG PO TABS
20.0000 mg | ORAL_TABLET | Freq: Every day | ORAL | Status: DC
  Administered 2015-04-17 – 2015-04-18 (×2): 20 mg via ORAL
  Filled 2015-04-17 (×2): qty 1

## 2015-04-17 MED ORDER — MIDAZOLAM HCL 2 MG/2ML IJ SOLN
INTRAMUSCULAR | Status: AC
  Filled 2015-04-17: qty 2

## 2015-04-17 MED ORDER — VANCOMYCIN HCL IN DEXTROSE 1-5 GM/200ML-% IV SOLN
1000.0000 mg | INTRAVENOUS | Status: AC
  Administered 2015-04-17: 1000 mg via INTRAVENOUS
  Filled 2015-04-17: qty 200

## 2015-04-17 MED ORDER — ROCURONIUM BROMIDE 100 MG/10ML IV SOLN
INTRAVENOUS | Status: DC | PRN
  Administered 2015-04-17: 50 mg via INTRAVENOUS

## 2015-04-17 MED ORDER — PROPOFOL 10 MG/ML IV BOLUS
INTRAVENOUS | Status: DC | PRN
  Administered 2015-04-17: 50 mg via INTRAVENOUS
  Administered 2015-04-17: 110 mg via INTRAVENOUS

## 2015-04-17 MED ORDER — ONDANSETRON HCL 4 MG/2ML IJ SOLN
INTRAMUSCULAR | Status: AC
  Filled 2015-04-17: qty 2

## 2015-04-17 MED ORDER — LIDOCAINE HCL (CARDIAC) 20 MG/ML IV SOLN
INTRAVENOUS | Status: DC | PRN
  Administered 2015-04-17: 100 mg via INTRAVENOUS

## 2015-04-17 MED ORDER — AMLODIPINE BESYLATE 5 MG PO TABS
5.0000 mg | ORAL_TABLET | Freq: Every day | ORAL | Status: DC
  Administered 2015-04-17 – 2015-04-18 (×2): 5 mg via ORAL
  Filled 2015-04-17 (×2): qty 1

## 2015-04-17 MED ORDER — SUGAMMADEX SODIUM 500 MG/5ML IV SOLN
INTRAVENOUS | Status: AC
  Filled 2015-04-17: qty 5

## 2015-04-17 MED ORDER — LACTATED RINGERS IV SOLN
INTRAVENOUS | Status: DC
  Administered 2015-04-17 (×2): via INTRAVENOUS

## 2015-04-17 MED ORDER — FENTANYL CITRATE (PF) 100 MCG/2ML IJ SOLN
INTRAMUSCULAR | Status: DC | PRN
  Administered 2015-04-17: 100 ug via INTRAVENOUS
  Administered 2015-04-17: 150 ug via INTRAVENOUS
  Administered 2015-04-17 (×2): 100 ug via INTRAVENOUS

## 2015-04-17 MED ORDER — SODIUM CHLORIDE 0.9% FLUSH: 3.0000 mL | INTRAVENOUS | Status: DC | PRN

## 2015-04-17 SURGICAL SUPPLY — 53 items
APL SKNCLS STERI-STRIP NONHPOA (GAUZE/BANDAGES/DRESSINGS) ×1
BAG DECANTER FOR FLEXI CONT (MISCELLANEOUS) ×3 IMPLANT
BENZOIN TINCTURE PRP APPL 2/3 (GAUZE/BANDAGES/DRESSINGS) ×3 IMPLANT
BIT DRILL POWER (BIT) IMPLANT
BUR MATCHSTICK NEURO 3.0 LAGG (BURR) ×3 IMPLANT
CAGE COROENT SM 6X13X15 (Cage) ×4 IMPLANT
CANISTER SUCT 3000ML PPV (MISCELLANEOUS) ×3 IMPLANT
CLOSURE WOUND 1/2 X4 (GAUZE/BANDAGES/DRESSINGS) ×1
DECANTER SPIKE VIAL GLASS SM (MISCELLANEOUS) ×2 IMPLANT
DRAPE C-ARM 42X72 X-RAY (DRAPES) ×6 IMPLANT
DRAPE LAPAROTOMY 100X72 PEDS (DRAPES) ×3 IMPLANT
DRAPE MICROSCOPE LEICA (MISCELLANEOUS) ×3 IMPLANT
DRAPE POUCH INSTRU U-SHP 10X18 (DRAPES) ×3 IMPLANT
DRILL BIT POWER (BIT) ×3
DRSG OPSITE POSTOP 3X4 (GAUZE/BANDAGES/DRESSINGS) ×2 IMPLANT
DRSG OPSITE POSTOP 4X6 (GAUZE/BANDAGES/DRESSINGS) IMPLANT
DURAPREP 6ML APPLICATOR 50/CS (WOUND CARE) ×3 IMPLANT
ELECT COATED BLADE 2.86 ST (ELECTRODE) ×3 IMPLANT
ELECT REM PT RETURN 9FT ADLT (ELECTROSURGICAL) ×3
ELECTRODE REM PT RTRN 9FT ADLT (ELECTROSURGICAL) ×1 IMPLANT
GAUZE SPONGE 4X4 16PLY XRAY LF (GAUZE/BANDAGES/DRESSINGS) IMPLANT
GLOVE BIO SURGEON STRL SZ8 (GLOVE) ×5 IMPLANT
GLOVE BIO SURGEON STRL SZ8.5 (GLOVE) ×3 IMPLANT
GLOVE BIOGEL PI IND STRL 7.0 (GLOVE) IMPLANT
GLOVE BIOGEL PI INDICATOR 7.0 (GLOVE) ×2
GLOVE SURG SS PI 6.5 STRL IVOR (GLOVE) ×9 IMPLANT
GOWN STRL REUS W/ TWL LRG LVL3 (GOWN DISPOSABLE) IMPLANT
GOWN STRL REUS W/ TWL XL LVL3 (GOWN DISPOSABLE) ×1 IMPLANT
GOWN STRL REUS W/TWL 2XL LVL3 (GOWN DISPOSABLE) IMPLANT
GOWN STRL REUS W/TWL LRG LVL3 (GOWN DISPOSABLE) ×3
GOWN STRL REUS W/TWL XL LVL3 (GOWN DISPOSABLE) ×6
HEMOSTAT POWDER KIT SURGIFOAM (HEMOSTASIS) ×3 IMPLANT
KIT BASIN OR (CUSTOM PROCEDURE TRAY) ×3 IMPLANT
KIT ROOM TURNOVER OR (KITS) ×3 IMPLANT
NDL HYPO 25X1 1.5 SAFETY (NEEDLE) ×1 IMPLANT
NDL SPNL 20GX3.5 QUINCKE YW (NEEDLE) ×1 IMPLANT
NEEDLE HYPO 25X1 1.5 SAFETY (NEEDLE) ×3 IMPLANT
NEEDLE SPNL 20GX3.5 QUINCKE YW (NEEDLE) ×3 IMPLANT
NS IRRIG 1000ML POUR BTL (IV SOLUTION) ×3 IMPLANT
PACK LAMINECTOMY NEURO (CUSTOM PROCEDURE TRAY) ×3 IMPLANT
PAD ARMBOARD 7.5X6 YLW CONV (MISCELLANEOUS) ×7 IMPLANT
PATTIES SURGICAL 1X1 (DISPOSABLE) ×2 IMPLANT
PLATE ARCHON 2-LEVEL 36MM (Plate) ×2 IMPLANT
RUBBERBAND STERILE (MISCELLANEOUS) ×6 IMPLANT
SCREW ARCHON SELFTAP 4.0X13 (Screw) ×12 IMPLANT
SPONGE INTESTINAL PEANUT (DISPOSABLE) ×3 IMPLANT
SPONGE SURGIFOAM ABS GEL SZ50 (HEMOSTASIS) ×3 IMPLANT
STRIP CLOSURE SKIN 1/2X4 (GAUZE/BANDAGES/DRESSINGS) ×2 IMPLANT
SUT VIC AB 3-0 SH 8-18 (SUTURE) ×6 IMPLANT
TOWEL OR 17X24 6PK STRL BLUE (TOWEL DISPOSABLE) ×3 IMPLANT
TOWEL OR 17X26 10 PK STRL BLUE (TOWEL DISPOSABLE) ×3 IMPLANT
TRAP SPECIMEN MUCOUS 40CC (MISCELLANEOUS) ×2 IMPLANT
WATER STERILE IRR 1000ML POUR (IV SOLUTION) ×3 IMPLANT

## 2015-04-17 NOTE — Anesthesia Preprocedure Evaluation (Signed)
Anesthesia Evaluation  Patient identified by MRN, date of birth, ID band Patient awake    Reviewed: Allergy & Precautions, NPO status   Airway Mallampati: I  TM Distance: >3 FB Neck ROM: Full    Dental   Pulmonary Current Smoker,    Pulmonary exam normal        Cardiovascular hypertension, Pt. on medications Normal cardiovascular exam     Neuro/Psych    GI/Hepatic GERD  Medicated and Controlled,  Endo/Other    Renal/GU Renal InsufficiencyRenal disease     Musculoskeletal   Abdominal   Peds  Hematology   Anesthesia Other Findings   Reproductive/Obstetrics                             Anesthesia Physical Anesthesia Plan  ASA: III  Anesthesia Plan: General   Post-op Pain Management:    Induction: Intravenous  Airway Management Planned: Oral ETT  Additional Equipment:   Intra-op Plan:   Post-operative Plan: Extubation in OR  Informed Consent: I have reviewed the patients History and Physical, chart, labs and discussed the procedure including the risks, benefits and alternatives for the proposed anesthesia with the patient or authorized representative who has indicated his/her understanding and acceptance.     Plan Discussed with: CRNA and Surgeon  Anesthesia Plan Comments:         Anesthesia Quick Evaluation

## 2015-04-17 NOTE — Anesthesia Procedure Notes (Signed)
Procedure Name: Intubation Date/Time: 04/17/2015 9:42 AM Performed by: Salli Quarry Daivion Pape Pre-anesthesia Checklist: Patient identified, Emergency Drugs available, Suction available and Patient being monitored Patient Re-evaluated:Patient Re-evaluated prior to inductionOxygen Delivery Method: Circle System Utilized Preoxygenation: Pre-oxygenation with 100% oxygen Intubation Type: IV induction Ventilation: Mask ventilation without difficulty Laryngoscope Size: Mac and 3 Grade View: Grade II Tube type: Oral Tube size: 7.0 mm Number of attempts: 1 Airway Equipment and Method: Stylet Placement Confirmation: ETT inserted through vocal cords under direct vision,  positive ETCO2 and breath sounds checked- equal and bilateral Secured at: 22 cm Tube secured with: Tape Dental Injury: Teeth and Oropharynx as per pre-operative assessment

## 2015-04-17 NOTE — Op Note (Signed)
04/17/2015  11:44 AM  PATIENT:  [name]  60 y.o. 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 Decompressive anterior cervical discectomy C5-6 and C6-7, 2. Anterior cervical arthrodesis C5-C6 C6-7 utilizing peek interbody cages packed with morcellized autograft, 3. Anterior cervical plating C5-C7 utilizing a archon plate  SURGEON:  Sherley Bounds, MD  ASSISTANTS: Dr. Arnoldo Morale  ANESTHESIA:   General  EBL: 100 ml  Total I/O In: 1000 [I.V.:1000] Out: 100 [Blood:100]  BLOOD ADMINISTERED:none  DRAINS: None   SPECIMEN:  No Specimen  INDICATION FOR PROCEDURE: This patient presented with severe neck and left arm pain. She tried medical management without relief. CT scan showed severe spondylosis with stenosis at C5-6 and C6-7. Recommended ACDF with plating. Patient understood the risks, benefits, and alternatives and potential outcomes and wished to proceed.  PROCEDURE DETAILS: Patient was brought to the operating room placed under general endotracheal anesthesia. Patient was placed in the supine position on the operating room table. The neck was prepped with Duraprep and draped in a sterile fashion.   Three cc of local anesthesia was injected and a transverse incision was made on the right side of the neck.  Dissection was carried down thru the subcutaneous tissue and the platysma was  elevated, opened, and undermined with Metzenbaum scissors.  Dissection was then carried out thru an avascular plane leaving the sternocleidomastoid carotid artery and jugular vein laterally and the trachea and esophagus medially. The ventral aspect of the vertebral column was identified and a localizing x-ray was taken. The C5-6 level was identified. The longus colli muscles were then elevated and the retractor was placed to expose C5-6 and C6-7. The annulus was incised at both levels and the disc space entered.  Discectomy was performed with micro-curettes and pituitary rongeurs. I then used the high-speed drill to drill the endplates down to the level of the posterior longitudinal ligament. The drill shavings were saved in a mucous trap for later arthrodesis. The operating microscope was draped and brought into the field provided additional magnification, illumination and visualization. Discectomy was continued posteriorly thru the disc space. Posterior longitudinal ligament was opened with a nerve hook, and then removed along with disc herniation and osteophytes, decompressing the spinal canal and thecal sac at both levels. We then continued to remove osteophytic overgrowth and disc material decompressing the neural foramina and exiting nerve roots bilaterally. The scope was angled up and down to help decompress and undercut the vertebral bodies. Once the decompression was completed we could pass a nerve hook circumferentially to assure adequate decompression in the midline and in the neural foramina. So by both visualization and palpation we felt we had an adequate decompression of the neural elements. We then measured the height of the intravertebral disc space and selected a 6 millimeter Peek interbody cage packed with autograft and morcellized allograft. It was then gently positioned in the intravertebral disc space and countersunk at both levels. I then used a 36 mm archon plate and placed  variable angle screws into the vertebral bodies and locked them into position. The wound was irrigated with bacitracin solution, checked for hemostasis which was established and confirmed. Once meticulous hemostasis was achieved, we then proceeded with closure. The platysma was closed with interrupted 3-0 undyed Vicryl suture, the subcuticular layer was closed with interrupted 3-0 undyed Vicryl suture. The skin edges were approximated with steristrips. The drapes were removed. A sterile dressing  was applied. The patient was then  awakened from general anesthesia and transferred to the recovery room in stable condition. At the end of the procedure all sponge, needle and instrument counts were correct.   PLAN OF CARE: Admit to inpatient   PATIENT DISPOSITION:  PACU - hemodynamically stable.   Delay start of Pharmacological VTE agent (>24hrs) due to surgical blood loss or risk of bleeding:  yes

## 2015-04-17 NOTE — Anesthesia Postprocedure Evaluation (Signed)
Anesthesia Post Note  Patient: Syniyah Schuring  Procedure(s) Performed: Procedure(s) (LRB): Cervical five-six, Cervical six-seven Anterior cervical decompression/diskectomy/fusion (N/A)  Patient location during evaluation: PACU Anesthesia Type: General Level of consciousness: awake and alert Pain management: pain level controlled Vital Signs Assessment: post-procedure vital signs reviewed and stable Respiratory status: spontaneous breathing, nonlabored ventilation, respiratory function stable and patient connected to nasal cannula oxygen Cardiovascular status: blood pressure returned to baseline and stable Postop Assessment: no signs of nausea or vomiting Anesthetic complications: no    Last Vitals:  Filed Vitals:   04/17/15 1323 04/17/15 1340  BP: 154/87 174/96  Pulse: 68 69  Temp: 36.2 C 36.3 C  Resp: 13 18    Last Pain:  Filed Vitals:   04/17/15 1403  PainSc: Arimo DAVID

## 2015-04-17 NOTE — Transfer of Care (Signed)
Immediate Anesthesia Transfer of Care Note  Patient: [name]  Procedure(s) Performed: Procedure(s): Cervical five-six, Cervical six-seven Anterior cervical decompression/diskectomy/fusion (N/A)  Patient Location: PACU  Anesthesia Type:General  Level of Consciousness: awake, alert  and patient cooperative  Airway & Oxygen Therapy: Patient Spontanous Breathing and Patient connected to nasal cannula oxygen  Post-op Assessment: Report given to RN and Post -op Vital signs reviewed and stable  Post vital signs: Reviewed and stable  Last Vitals:  Filed Vitals:   04/17/15 0752  BP: 162/86  Temp: 36.7 C  Resp: 20    Complications: No apparent anesthesia complications

## 2015-04-17 NOTE — Progress Notes (Signed)
Pharmacy Antibiotic Note  [name] is a 60 y.o. female admitted on 04/17/2015 for a planned ACDF. Pharmacy has been consulted for Vancomycin x 1 dose post-op since the patient does not have a drain in place (per RN report)  Vanc 1g given pre-op at 0945 today. SCr 1.14 on pre-op labs, CrCl~55-60 ml/min.   Plan: 1. Vancomycin 1g IV x 1 dose at 2200 today 2. Pharmacy will sign off as not further doses are expected.   Height: 5' 6.5" (168.9 cm) Weight: 190 lb (86.183 kg) IBW/kg (Calculated) : 60.45  Temp (24hrs), Avg:97.4 F (36.3 C), Min:97.2 F (36.2 C), Max:98 F (36.7 C)  No results for input(s): WBC, CREATININE, LATICACIDVEN, VANCOTROUGH, VANCOPEAK, VANCORANDOM, GENTTROUGH, GENTPEAK, GENTRANDOM, TOBRATROUGH, TOBRAPEAK, TOBRARND, AMIKACINPEAK, AMIKACINTROU, AMIKACIN in the last 168 hours.  Estimated Creatinine Clearance: 57.9 mL/min (by C-G formula based on Cr of 1.14).    Allergies  Allergen Reactions  . Iohexol Anaphylaxis, Swelling and Other (See Comments)      Throat swelling    . 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 Levofloxacin In D5w Other (See Comments)    Has baseline prolonged QTc.  Avoid QTc prolonging medications.  . Penicillins Itching    Has patient had a PCN reaction causing immediate rash, facial/tongue/throat swelling, SOB or lightheadedness with hypotension: yes Has patient had a PCN reaction causing severe rash involving mucus membranes or skin necrosis: unknown Has patient had a PCN reaction that required hospitalization : no, reacted while in the Drs office Has patient had a PCN reaction occurring within the last 10 years: yes If all of the above answers are "NO", then may proceed with Cephalosporin use.   . 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 Zofran [Ondansetron Hcl] Other (See Comments)    Has baseline prolonged QTc.   Avoid QTc prolonging medications.    Thank you for allowing pharmacy to be a part of this patient's care.  Alycia Rossetti, PharmD, BCPS Clinical Pharmacist Pager: [phone] 04/17/2015 2:18 PM
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
## 2015-04-18 ENCOUNTER — Encounter (HOSPITAL_COMMUNITY): Payer: Self-pay | Admitting: Neurological Surgery

## 2015-04-18 DIAGNOSIS — M50223 Other cervical disc displacement at C6-C7 level: Secondary | ICD-10-CM | POA: Diagnosis not present

## 2015-04-18 MED ORDER — METHOCARBAMOL 500 MG PO TABS: 500.0000 mg | ORAL_TABLET | Freq: Four times a day (QID) | ORAL | Status: DC | PRN

## 2015-04-18 MED ORDER — DIPHENHYDRAMINE HCL 25 MG PO CAPS
25.0000 mg | ORAL_CAPSULE | Freq: Four times a day (QID) | ORAL | Status: DC | PRN
  Administered 2015-04-18 (×2): 25 mg via ORAL
  Filled 2015-04-18 (×2): qty 1

## 2015-04-18 MED ORDER — MUPIROCIN 2 % EX OINT
1.0000 "application " | TOPICAL_OINTMENT | Freq: Two times a day (BID) | CUTANEOUS | Status: DC
  Administered 2015-04-18: 1 via NASAL

## 2015-04-18 MED ORDER — OXYCODONE-ACETAMINOPHEN 5-325 MG PO TABS: 1.0000 | ORAL_TABLET | Freq: Four times a day (QID) | ORAL | Status: DC | PRN

## 2015-04-18 MED ORDER — CHLORHEXIDINE GLUCONATE CLOTH 2 % EX PADS
6.0000 | MEDICATED_PAD | Freq: Every day | CUTANEOUS | Status: DC
  Administered 2015-04-18: 6 via TOPICAL

## 2015-04-18 NOTE — Progress Notes (Signed)
Pt and sister given D/C instructions with Rx's, verbal understanding was provided. Pt's IV was removed prior to D/C. Pt's incision is clean and dry with no sign of infection. Pt refused any Home Health and equipment. Pt D/C'd home via wheelchair @ 1230 per MD order. Pt is stable @ D/C. Holli Humbles, RN

## 2015-04-18 NOTE — Discharge Summary (Signed)
Physician Discharge Summary  Patient ID: [name] MRN: [id number] DOB/AGE: Nov 20, 1955 60 y.o.  Admit date: 04/17/2015 Discharge date: 04/18/2015  Admission Diagnoses: cervical stenosis    Discharge Diagnoses: same   Discharged Condition: good  Hospital Course: The patient was admitted on 04/17/2015 and taken to the operating room where the patient underwent ACDF. The patient tolerated the procedure well and was taken to the recovery room and then to the floor in stable condition. The hospital course was routine. There were no complications. The wound remained clean dry and intact. Pt had appropriate neck soreness. No complaints of arm pain or new N/T/W. The patient remained afebrile with stable vital signs, and tolerated a regular diet. The patient continued to increase activities, and pain was well controlled with oral pain medications.   Consults: None  Significant Diagnostic Studies:  Results for orders placed or performed during the hospital encounter of 04/09/15  Surgical pcr screen  Result Value Ref Range   MRSA, PCR POSITIVE (A) NEGATIVE   Staphylococcus aureus POSITIVE (A) NEGATIVE  CBC  Result Value Ref Range   WBC 5.3 4.0 - 10.5 K/uL   RBC 4.90 3.87 - 5.11 MIL/uL   Hemoglobin 14.4 12.0 - 15.0 g/dL   HCT 43.6 36.0 - 46.0 %   MCV 89.0 78.0 - 100.0 fL   MCH 29.4 26.0 - 34.0 pg   MCHC 33.0 30.0 - 36.0 g/dL   RDW 14.1 11.5 - 15.5 %   Platelets 335 150 - 400 K/uL  Comprehensive metabolic panel  Result Value Ref Range   Sodium 140 135 - 145 mmol/L   Potassium 4.6 3.5 - 5.1 mmol/L   Chloride 109 101 - 111 mmol/L   CO2 22 22 - 32 mmol/L   Glucose, Bld 123 (H) 65 - 99 mg/dL   BUN 20 6 - 20 mg/dL   Creatinine, Ser 1.14 (H) 0.44 - 1.00 mg/dL   Calcium 9.0 8.9 - 10.3 mg/dL   Total Protein 7.0 6.5 - 8.1 g/dL   Albumin 3.5 3.5 - 5.0 g/dL   AST 18 15 - 41 U/L   ALT 15 14 - 54 U/L   Alkaline Phosphatase 95 38 - 126 U/L   Total Bilirubin 0.5 0.3 - 1.2 mg/dL   GFR calc  non Af Amer 51 (L) >60 mL/min   GFR calc Af Amer 59 (L) >60 mL/min   Anion gap 9 5 - 15    Dg Chest 2 View  04/09/2015  CLINICAL DATA:  History smoking. Hypertension. Cervical spine surgery. EXAM: CHEST  2 VIEW COMPARISON:  02/14/2015.  09/09/2014. FINDINGS: Mediastinum and hilar structures are normal. Questionable density is in the left mid lung. This may represent deformity of the left anterior fourth rib lesion such as healed fracture or underlying pulmonary lesion. Repeat PA and lateral chest x-ray and left rib series suggested for further evaluation. No acute infiltrate. Chronic interstitial prominence. No pleural effusion pneumothorax. Heart size stable. No acute bony abnormality identified. IMPRESSION: 1. Density noted projected over the left mid chest at the level of the anterior fourth rib. This could represent a a left anterior fourth rib lesion such as healed fracture or underlying pulmonary lesion. Repeat PA and lateral chest x-ray and left rib series suggest for further evaluation. 2.  Stable cardiomegaly.  Chronic interstitial lung disease. Electronically Signed   By: [place]   On: 04/09/2015 13:21   Dg Cervical Spine 1 View  04/17/2015  CLINICAL DATA:  C5-C7 ACDF EXAM: DG  C-ARM 61-120 MIN; DG CERVICAL SPINE - 1 VIEW COMPARISON:  02/24/2015 FINDINGS: There has been anterior cervical discectomy and fusion from C5-C7. There is anterior plate with screw fixation. Interbody fusion material is in place headaches level. Detail is limited because of shoulder density. IMPRESSION: ACDF C5-C7. Electronically Signed   By: Nelson Chimes M.D.   On: 04/17/2015 11:23   Dg C-arm 1-60 Min  04/17/2015  CLINICAL DATA:  C5-C7 ACDF EXAM: DG C-ARM 61-120 MIN; DG CERVICAL SPINE - 1 VIEW COMPARISON:  02/24/2015 FINDINGS: There has been anterior cervical discectomy and fusion from C5-C7. There is anterior plate with screw fixation. Interbody fusion material is in place headaches level. Detail is limited  because of shoulder density. IMPRESSION: ACDF C5-C7. Electronically Signed   By: Nelson Chimes M.D.   On: 04/17/2015 11:23    Antibiotics:  Anti-infectives    Start     Dose/Rate Route Frequency Ordered Stop   04/17/15 2200  vancomycin (VANCOCIN) IVPB 1000 mg/200 mL premix     1,000 mg 200 mL/hr over 60 Minutes Intravenous  Once 04/17/15 1418 04/17/15 2256   04/17/15 1011  bacitracin 50,000 Units in sodium chloride irrigation 0.9 % 500 mL irrigation  Status:  Discontinued       As needed 04/17/15 1011 04/17/15 1134   04/17/15 0715  vancomycin (VANCOCIN) IVPB 1000 mg/200 mL premix     1,000 mg 200 mL/hr over 60 Minutes Intravenous On call to O.R. 04/17/15 0707 04/17/15 1045      Discharge Exam: Blood pressure 150/69, pulse 74, temperature 97.7 F (36.5 C), temperature source Oral, resp. rate 16, height 5' 6.5" (1.689 m), weight 86.183 kg (190 lb), SpO2 99 %. Neurologic: Grossly normal Dressing dry  Discharge Medications:     Medication List    STOP taking these medications        ALEVE PM 220-25 MG Tabs  Generic drug:  Naproxen Sod-Diphenhydramine     ibuprofen 200 MG tablet  Commonly known as:  ADVIL,MOTRIN      TAKE these medications        albuterol 108 (90 Base) MCG/ACT inhaler  Commonly known as:  PROVENTIL HFA;VENTOLIN HFA  Inhale 2 puffs into the lungs every 6 (six) hours as needed for wheezing or shortness of breath.     amitriptyline 50 MG tablet  Commonly known as:  ELAVIL  Take 50 mg by mouth at bedtime.     amLODipine 5 MG tablet  Commonly known as:  NORVASC  Take 5 mg by mouth daily.     atorvastatin 40 MG tablet  Commonly known as:  LIPITOR  Take 1 tablet (40 mg total) by mouth daily at 6 PM.     beclomethasone 80 MCG/ACT inhaler  Commonly known as:  QVAR  Inhale 2 puffs into the lungs 2 (two) times daily.     docusate sodium 100 MG capsule  Commonly known as:  COLACE  Take 100 mg by mouth 2 (two) times daily as needed for moderate constipation.      lisinopril 20 MG tablet  Commonly known as:  PRINIVIL,ZESTRIL  Take 20 mg by mouth daily.     methocarbamol 500 MG tablet  Commonly known as:  ROBAXIN  Take 1 tablet (500 mg total) by mouth every 6 (six) hours as needed for muscle spasms.     nicotine 21 mg/24hr patch  Commonly known as:  NICODERM CQ - dosed in mg/24 hours  Place 1 patch (21 mg total) onto the  skin daily.     OVER THE COUNTER MEDICATION  Apply 1 application topically daily as needed (rash on buttocks from pads). Sensi Care Protective Barrier (15% zinc oxide, 49% petrolatum)     oxybutynin 5 MG tablet  Commonly known as:  DITROPAN  Take 5 mg by mouth 2 (two) times daily.     oxyCODONE-acetaminophen 5-325 MG tablet  Commonly known as:  PERCOCET/ROXICET  Take 1-2 tablets by mouth every 6 (six) hours as needed for severe pain.     pantoprazole 40 MG tablet  Commonly known as:  PROTONIX  Take 1 tablet (40 mg total) by mouth daily.     predniSONE 10 MG tablet  Commonly known as:  DELTASONE  Take 1 tablet (10 mg total) by mouth daily. Continuous course        Disposition: home   Final Dx: ACDF      Discharge Instructions     Remove dressing in 72 hours    Complete by:  As directed      Call MD for:  difficulty breathing, headache or visual disturbances    Complete by:  As directed      Call MD for:  persistant nausea and vomiting    Complete by:  As directed      Call MD for:  redness, tenderness, or signs of infection (pain, swelling, redness, odor or green/yellow discharge around incision site)    Complete by:  As directed      Call MD for:  severe uncontrolled pain    Complete by:  As directed      Call MD for:  temperature >100.4    Complete by:  As directed      Diet - low sodium heart healthy    Complete by:  As directed      Discharge instructions    Complete by:  As directed   No strenuous activity, no lifting, no driving     Increase activity slowly    Complete by:  As directed             Follow-up Information    Follow up with Dannel Rafter S, MD. Schedule an appointment as soon as possible for a visit in 2 weeks.   Specialty:  Neurosurgery   Contact information:   [address] [place] [address] [place] [address] [phone]        Signed: [name] 04/18/2015, 7:42 AM

## 2015-04-18 NOTE — Evaluation (Signed)
Occupational Therapy Evaluation and Discharge Patient Details Name: [name] MRN: [id number] DOB: [birth year] Today's Date: 04/18/2015    History of Present Illness Pt is a 60 y/o female who presents s/p C5-C7 ACDF on 04/17/15.   Clinical Impression   This 60 yo female admitted and underwent above presents to acute OT with all education complete, we will D/C from acute OT.    Follow Up Recommendations  No OT follow up    Equipment Recommendations  None recommended by OT       Precautions / Restrictions Precautions Precautions: Fall;Cervical Precaution Booklet Issued: Yes (comment) Precaution Comments: Reviewed handout with pt and she was cued for precautions during functional mobility.  Restrictions Weight Bearing Restrictions: No      Mobility Bed Mobility Overal bed mobility: Needs Assistance Bed Mobility: Rolling;Sidelying to Sit Rolling: Modified independent (Device/Increase time) Sidelying to sit: Supervision (VCs for technique)      Transfers Overall transfer level: Needs assistance Equipment used: Rolling walker (2 wheeled) Transfers: Sit to/from Stand Sit to Stand: Supervision                 ADL Overall ADL's : Needs assistance/impaired Eating/Feeding: Independent;Sitting   Grooming: Supervision/safety;Standing Grooming Details (indicate cue type and reason): we discussed using 2 cups for oral care (one to spit and one to rinse with straw) so as to avoid bending over the sink and flexing/extending her neck Upper Body Bathing: Supervision/ safety;Sitting   Lower Body Bathing: Supervison/ safety;Set up;Sit to/from stand Lower Body Bathing Details (indicate cue type and reason): Pt can cross her legs to get to her feet Upper Body Dressing : Supervision/safety;Set up;Sitting   Lower Body Dressing: Set up;Supervision/safety;Sit to/from stand Lower Body Dressing Details (indicate cue type and reason): Pt can cross her legs to get to her  feet Toilet Transfer: Supervision/safety;Ambulation;Comfort height toilet;Regular Toilet   Toileting- Clothing Manipulation and Hygiene: Supervision/safety;Sit to/from stand                         Pertinent Vitals/Pain Pain Assessment: Faces Faces Pain Scale: Hurts even more Pain Location: operative site and itching all over Pain Descriptors / Indicators: Sore;Operative site guarding Pain Intervention(s): Monitored during session;Repositioned (pt asked about something for itching--per RN not time until 10:00AM)     Hand Dominance Right   Extremity/Trunk Assessment Upper Extremity Assessment Upper Extremity Assessment: Overall WFL for tasks assessed       Communication Communication Communication: No difficulties   Cognition Arousal/Alertness: Awake/alert Behavior During Therapy: WFL for tasks assessed/performed Overall Cognitive Status: Within Functional Limits for tasks assessed                                Home Living Family/patient expects to be discharged to:: Private residence Living Arrangements: Other relatives (sister) Available Help at Discharge: Family;Available 24 hours/day Type of Home: Apartment Home Access: Stairs to enter Entrance Stairs-Number of Steps: 6-10 Entrance Stairs-Rails: Left Home Layout: One level     Bathroom Shower/Tub: Tub/shower unit;Curtain Shower/tub characteristics: Architectural technologist: Standard     Home Equipment: Cane - single point          Prior Functioning/Environment Level of Independence: Independent with assistive device(s)             OT Diagnosis: Generalized weakness;Acute pain         OT Goals(Current goals can be found in the care plan section)  Acute Rehab OT Goals Patient Stated Goal: Home today  OT Frequency:                End of Session Equipment Utilized During Treatment: Rolling walker Nurse Communication: Mobility status (pt requests meds for itching)  Activity  Tolerance:  (limited by anxiousness with itching) Patient left: in chair;with call bell/phone within reach   Time: [id number] OT Time Calculation (min): 25 min Charges:  OT General Charges $OT Visit: 1 Procedure OT Evaluation $OT Eval Moderate Complexity: 1 Procedure OT Treatments $Self Care/Home Management : 8-22 mins  Almon Register [id number] 04/18/2015, 10:57 AM

## 2015-04-18 NOTE — Evaluation (Signed)
Physical Therapy Evaluation Patient Details Name: [name] MRN: [id number] DOB: 1955/11/22 Today's Date: 04/18/2015   History of Present Illness  Pt is a 60 y/o female who presents s/p C5-C7 ACDF on 04/17/15.  Clinical Impression  Pt admitted with above diagnosis. Pt currently with functional limitations due to the deficits listed below (see PT Problem List). At the time of PT eval pt was able to perform transfers and ambulation with min guard assist with RW. Increased assistance was required with SPC, and it was recommended that pt use RW initially. Pt agreeable after she practiced gait training with SPC. Pt will benefit from skilled PT to increase their independence and safety with mobility to allow discharge to the venue listed below.       Follow Up Recommendations No PT follow up;Supervision for mobility/OOB    Equipment Recommendations  Rolling walker with 5" wheels;3in1 (PT)    Recommendations for Other Services       Precautions / Restrictions Precautions Precautions: Fall;Back Precaution Booklet Issued: Yes (comment) Precaution Comments: Reviewed handout with pt and she was cued for precautions during functional mobility.  Restrictions Weight Bearing Restrictions: No      Mobility  Bed Mobility Overal bed mobility: Needs Assistance Bed Mobility: Rolling;Sit to Sidelying Rolling: Modified independent (Device/Increase time)       Sit to sidelying: Supervision General bed mobility comments: No assist required to transition back to supine. HOB flat and no use of rails required.   Transfers Overall transfer level: Needs assistance Equipment used: Straight cane;Rolling walker (2 wheeled) Transfers: Sit to/from Stand Sit to Stand: Min guard         General transfer comment: More effortful with SPC vs RW. No assist required however close guard was provided for safety.   Ambulation/Gait Ambulation/Gait assistance: Min guard Ambulation Distance (Feet): 200  Feet Assistive device: Rolling walker (2 wheeled);Straight cane Gait Pattern/deviations: Step-through pattern;Decreased stride length;Trunk flexed Gait velocity: Decreased Gait velocity interpretation: Below normal speed for age/gender General Gait Details: Pt initially refusing use of RW and wanted SPC. With cane, pt reaching out for support with free hand and demonstrates flexed trunk. Pt given walker and was able to improve posture and demonstrate a smoother gait pattern.  Stairs Stairs: Yes Stairs assistance: Min guard Stair Management: One rail Left;Sideways Number of Stairs: 6 General stair comments: Pt was able to complete 6 stairs utilizing sideways technique. VC's required for sequencing and general safety awarenss during stair training.   Wheelchair Mobility    Modified Rankin (Stroke Patients Only)       Balance Overall balance assessment: Needs assistance;History of Falls Sitting-balance support: Feet supported;No upper extremity supported Sitting balance-Leahy Scale: Fair     Standing balance support: Single extremity supported;During functional activity Standing balance-Leahy Scale: Poor                               Pertinent Vitals/Pain Pain Assessment: Faces Faces Pain Scale: Hurts even more Pain Location: Incision site (itching all over as well) Pain Descriptors / Indicators: Operative site guarding;Sore Pain Intervention(s): Limited activity within patient's tolerance;Monitored during session;Repositioned    Home Living Family/patient expects to be discharged to:: Private residence Living Arrangements: Other relatives (sister) Available Help at Discharge: Family;Available 24 hours/day Type of Home: Apartment Home Access: Stairs to enter Entrance Stairs-Rails: Left Entrance Stairs-Number of Steps: 6-10 Home Layout: One level Home Equipment: Cane - single point      Prior Function  Level of Independence: Independent with assistive  device(s)               Hand Dominance   Dominant Hand: Right    Extremity/Trunk Assessment   Upper Extremity Assessment: Defer to OT evaluation           Lower Extremity Assessment: Generalized weakness      Cervical / Trunk Assessment: Normal;Other exceptions  Communication   Communication: No difficulties  Cognition Arousal/Alertness: Awake/alert Behavior During Therapy: WFL for tasks assessed/performed Overall Cognitive Status: Within Functional Limits for tasks assessed                      General Comments      Exercises        Assessment/Plan    PT Assessment Patient needs continued PT services  PT Diagnosis Difficulty walking;Acute pain   PT Problem List Decreased strength;Decreased range of motion;Decreased activity tolerance;Decreased balance;Decreased mobility;Decreased knowledge of use of DME;Decreased safety awareness;Decreased knowledge of precautions;Pain  PT Treatment Interventions DME instruction;Gait training;Stair training;Functional mobility training;Therapeutic activities;Therapeutic exercise;Neuromuscular re-education;Patient/family education   PT Goals (Current goals can be found in the Care Plan section) Acute Rehab PT Goals Patient Stated Goal: Home today PT Goal Formulation: With patient Time For Goal Achievement: 04/25/15 Potential to Achieve Goals: Good    Frequency Min 5X/week   Barriers to discharge        Co-evaluation               End of Session Equipment Utilized During Treatment: Gait belt Activity Tolerance: Patient tolerated treatment well Patient left: in bed;with call bell/phone within reach Nurse Communication: Mobility status    Functional Assessment Tool Used: Clinical judgement Functional Limitation: Mobility: Walking and moving around Mobility: Walking and Moving Around Current Status [id number]): At least 1 percent but less than 20 percent impaired, limited or restricted Mobility: Walking  and Moving Around Goal Status [phone]): At least 1 percent but less than 20 percent impaired, limited or restricted    Time: 0926-0949 PT Time Calculation (min) (ACUTE ONLY): 23 min   Charges:   PT Evaluation $PT Eval Moderate Complexity: 1 Procedure PT Treatments $Gait Training: 8-22 mins   PT G Codes:   PT G-Codes **NOT FOR INPATIENT CLASS** Functional Assessment Tool Used: Clinical judgement Functional Limitation: Mobility: Walking and moving around Mobility: Walking and Moving Around Current Status [id number]): At least 1 percent but less than 20 percent impaired, limited or restricted Mobility: Walking and Moving Around Goal Status [phone]): At least 1 percent but less than 20 percent impaired, limited or restricted    Rolinda Roan 04/18/2015, 10:32 AM   Rolinda Roan, PT, DPT Acute Rehabilitation Services Pager: [phone]

## 2015-05-03 ENCOUNTER — Emergency Department (HOSPITAL_COMMUNITY)
Admission: EM | Admit: 2015-05-03 | Discharge: 2015-05-03 | Disposition: A | Discharge status: Home / Self Care | Payer: Medicaid Other | Attending: Emergency Medicine | Admitting: Emergency Medicine

## 2015-05-03 ENCOUNTER — Encounter (HOSPITAL_COMMUNITY): Payer: Self-pay | Admitting: Family Medicine

## 2015-05-03 DIAGNOSIS — I10 Essential (primary) hypertension: Secondary | ICD-10-CM | POA: Diagnosis not present

## 2015-05-03 DIAGNOSIS — Z8701 Personal history of pneumonia (recurrent): Secondary | ICD-10-CM | POA: Diagnosis not present

## 2015-05-03 DIAGNOSIS — J449 Chronic obstructive pulmonary disease, unspecified: Secondary | ICD-10-CM | POA: Diagnosis not present

## 2015-05-03 DIAGNOSIS — Z7951 Long term (current) use of inhaled steroids: Secondary | ICD-10-CM | POA: Insufficient documentation

## 2015-05-03 DIAGNOSIS — F17210 Nicotine dependence, cigarettes, uncomplicated: Secondary | ICD-10-CM | POA: Diagnosis not present

## 2015-05-03 DIAGNOSIS — K219 Gastro-esophageal reflux disease without esophagitis: Secondary | ICD-10-CM | POA: Diagnosis not present

## 2015-05-03 DIAGNOSIS — Z79899 Other long term (current) drug therapy: Secondary | ICD-10-CM | POA: Diagnosis not present

## 2015-05-03 DIAGNOSIS — M069 Rheumatoid arthritis, unspecified: Secondary | ICD-10-CM | POA: Diagnosis not present

## 2015-05-03 DIAGNOSIS — Z87442 Personal history of urinary calculi: Secondary | ICD-10-CM | POA: Insufficient documentation

## 2015-05-03 DIAGNOSIS — Z87820 Personal history of traumatic brain injury: Secondary | ICD-10-CM | POA: Insufficient documentation

## 2015-05-03 DIAGNOSIS — Z880 Allergy status to penicillin: Secondary | ICD-10-CM | POA: Insufficient documentation

## 2015-05-03 DIAGNOSIS — M542 Cervicalgia: Secondary | ICD-10-CM

## 2015-05-03 LAB — I-STAT CHEM 8, ED
BUN: 29 mg/dL — ABNORMAL HIGH (ref 6–20)
Calcium, Ion: 1.12 mmol/L — ABNORMAL LOW (ref 1.13–1.30)
Chloride: 104 mmol/L (ref 101–111)
Creatinine, Ser: 1.2 mg/dL — ABNORMAL HIGH (ref 0.44–1.00)
GLUCOSE: 97 mg/dL (ref 65–99)
HEMATOCRIT: 46 % (ref 36.0–46.0)
HEMOGLOBIN: 15.6 g/dL — AB (ref 12.0–15.0)
POTASSIUM: 4.2 mmol/L (ref 3.5–5.1)
Sodium: 140 mmol/L (ref 135–145)
TCO2: 27 mmol/L (ref 0–100)

## 2015-05-03 LAB — I-STAT TROPONIN, ED: TROPONIN I, POC: 0 ng/mL (ref 0.00–0.08)

## 2015-05-03 MED ORDER — OXYCODONE-ACETAMINOPHEN 5-325 MG PO TABS
1.0000 | ORAL_TABLET | ORAL | Status: DC | PRN
  Administered 2015-05-03: 1 via ORAL

## 2015-05-03 MED ORDER — OXYCODONE-ACETAMINOPHEN 5-325 MG PO TABS
ORAL_TABLET | ORAL | Status: AC
  Filled 2015-05-03: qty 1

## 2015-05-03 MED ORDER — OXYCODONE-ACETAMINOPHEN 5-325 MG PO TABS
1.0000 | ORAL_TABLET | Freq: Once | ORAL | Status: AC
Start: 2015-05-03 — End: 2015-05-03
  Administered 2015-05-03: 1 via ORAL
  Filled 2015-05-03: qty 1

## 2015-05-03 NOTE — Discharge Instructions (Signed)
There does not appear to be an emergent cause for your symptoms at this time. Is important feet follow-up with your doctor for reevaluation in the next 2 or 3 days. You may call your surgeon for further medication management. Return to ED for new or worsening symptoms as we discussed.
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
## 2015-05-03 NOTE — ED Notes (Signed)
Pt here for neck pain. sts that she has been taking percocet for pain but recent trip to [place] and left her meds there. Pt recent neck surgery a few weeks ago.

## 2015-05-03 NOTE — ED Notes (Addendum)
Pt roomed and hooked up to cardiac monitor

## 2015-05-03 NOTE — ED Notes (Signed)
Patient called crying that the center of her chest is hurting  States it has been hurting since her neck surgery 6 day ago.  Patient also states that she has forgptten all of her meds from where she came from [place]  Asked how she could forget them and stated she "just wasn't thinking".

## 2015-05-11 ENCOUNTER — Encounter (HOSPITAL_COMMUNITY): Payer: Self-pay | Admitting: *Deleted

## 2015-05-11 ENCOUNTER — Inpatient Hospital Stay (HOSPITAL_COMMUNITY)
Admission: EM | Admit: 2015-05-11 | Discharge: 2015-05-17 | DRG: 563 | Disposition: A | Discharge status: Skilled Nursing Facility | Payer: Medicaid Other | Attending: Neurological Surgery | Admitting: Neurological Surgery

## 2015-05-11 DIAGNOSIS — J449 Chronic obstructive pulmonary disease, unspecified: Secondary | ICD-10-CM | POA: Diagnosis present

## 2015-05-11 DIAGNOSIS — W19XXXA Unspecified fall, initial encounter: Secondary | ICD-10-CM | POA: Diagnosis present

## 2015-05-11 DIAGNOSIS — M069 Rheumatoid arthritis, unspecified: Secondary | ICD-10-CM | POA: Diagnosis present

## 2015-05-11 DIAGNOSIS — F17210 Nicotine dependence, cigarettes, uncomplicated: Secondary | ICD-10-CM | POA: Diagnosis present

## 2015-05-11 DIAGNOSIS — R52 Pain, unspecified: Secondary | ICD-10-CM

## 2015-05-11 DIAGNOSIS — T148XXA Other injury of unspecified body region, initial encounter: Secondary | ICD-10-CM

## 2015-05-11 DIAGNOSIS — Z981 Arthrodesis status: Secondary | ICD-10-CM

## 2015-05-11 DIAGNOSIS — G959 Disease of spinal cord, unspecified: Secondary | ICD-10-CM | POA: Diagnosis present

## 2015-05-11 DIAGNOSIS — R202 Paresthesia of skin: Secondary | ICD-10-CM

## 2015-05-11 DIAGNOSIS — M4802 Spinal stenosis, cervical region: Secondary | ICD-10-CM | POA: Diagnosis present

## 2015-05-11 DIAGNOSIS — S93402A Sprain of unspecified ligament of left ankle, initial encounter: Secondary | ICD-10-CM | POA: Diagnosis present

## 2015-05-11 DIAGNOSIS — S82891A Other fracture of right lower leg, initial encounter for closed fracture: Principal | ICD-10-CM | POA: Diagnosis present

## 2015-05-11 DIAGNOSIS — K219 Gastro-esophageal reflux disease without esophagitis: Secondary | ICD-10-CM | POA: Diagnosis present

## 2015-05-11 DIAGNOSIS — I10 Essential (primary) hypertension: Secondary | ICD-10-CM | POA: Diagnosis present

## 2015-05-11 DIAGNOSIS — R29898 Other symptoms and signs involving the musculoskeletal system: Secondary | ICD-10-CM

## 2015-05-11 HISTORY — DX: Cocaine abuse with unspecified cocaine-induced disorder: F14.19

## 2015-05-11 HISTORY — DX: Metabolic encephalopathy: G93.41

## 2015-05-11 MED ORDER — MORPHINE SULFATE (PF) 4 MG/ML IV SOLN
4.0000 mg | Freq: Once | INTRAVENOUS | Status: AC
  Administered 2015-05-12: 4 mg via INTRAVENOUS
  Filled 2015-05-11: qty 1
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
## 2015-05-11 NOTE — ED Notes (Signed)
Bladder Scan reading = 400 ml.

## 2015-05-11 NOTE — ED Notes (Signed)
Pt complains of ankle pain and frequent falls for the past 4 days. Pt states she fell and twisted her right ankle today, states pain is 10/10. Pt had neck surgery on 4/6, states she began feeling decreased sensation in her extremities and has had difficulty urinating for the past 2 weeks. Pt states her legs started giving out 4 days ago.

## 2015-05-12 ENCOUNTER — Encounter (HOSPITAL_COMMUNITY): Payer: Self-pay | Admitting: Neurological Surgery

## 2015-05-12 ENCOUNTER — Emergency Department (HOSPITAL_COMMUNITY): Payer: Medicaid Other

## 2015-05-12 ENCOUNTER — Inpatient Hospital Stay (HOSPITAL_COMMUNITY): Payer: Medicaid Other

## 2015-05-12 DIAGNOSIS — Z981 Arthrodesis status: Secondary | ICD-10-CM | POA: Diagnosis not present

## 2015-05-12 DIAGNOSIS — G8250 Quadriplegia, unspecified: Secondary | ICD-10-CM | POA: Diagnosis not present

## 2015-05-12 DIAGNOSIS — W19XXXA Unspecified fall, initial encounter: Secondary | ICD-10-CM | POA: Diagnosis present

## 2015-05-12 DIAGNOSIS — M47812 Spondylosis without myelopathy or radiculopathy, cervical region: Secondary | ICD-10-CM | POA: Diagnosis not present

## 2015-05-12 DIAGNOSIS — I10 Essential (primary) hypertension: Secondary | ICD-10-CM | POA: Diagnosis present

## 2015-05-12 DIAGNOSIS — F17210 Nicotine dependence, cigarettes, uncomplicated: Secondary | ICD-10-CM | POA: Diagnosis present

## 2015-05-12 DIAGNOSIS — S93402A Sprain of unspecified ligament of left ankle, initial encounter: Secondary | ICD-10-CM | POA: Diagnosis present

## 2015-05-12 DIAGNOSIS — J449 Chronic obstructive pulmonary disease, unspecified: Secondary | ICD-10-CM | POA: Diagnosis present

## 2015-05-12 DIAGNOSIS — S82891A Other fracture of right lower leg, initial encounter for closed fracture: Secondary | ICD-10-CM | POA: Diagnosis not present

## 2015-05-12 DIAGNOSIS — M069 Rheumatoid arthritis, unspecified: Secondary | ICD-10-CM | POA: Diagnosis present

## 2015-05-12 DIAGNOSIS — K219 Gastro-esophageal reflux disease without esophagitis: Secondary | ICD-10-CM | POA: Diagnosis present

## 2015-05-12 DIAGNOSIS — M4802 Spinal stenosis, cervical region: Secondary | ICD-10-CM | POA: Diagnosis present

## 2015-05-12 DIAGNOSIS — G959 Disease of spinal cord, unspecified: Secondary | ICD-10-CM | POA: Diagnosis present

## 2015-05-12 DIAGNOSIS — M542 Cervicalgia: Secondary | ICD-10-CM | POA: Diagnosis present

## 2015-05-12 LAB — URINALYSIS, ROUTINE W REFLEX MICROSCOPIC
BILIRUBIN URINE: NEGATIVE
GLUCOSE, UA: NEGATIVE mg/dL
Hgb urine dipstick: NEGATIVE
KETONES UR: NEGATIVE mg/dL
Nitrite: NEGATIVE
PH: 5.5 (ref 5.0–8.0)
Protein, ur: NEGATIVE mg/dL
SPECIFIC GRAVITY, URINE: 1.014 (ref 1.005–1.030)

## 2015-05-12 LAB — URINE MICROSCOPIC-ADD ON

## 2015-05-12 LAB — CBC WITH DIFFERENTIAL/PLATELET
BASOS PCT: 0 %
Basophils Absolute: 0 10*3/uL (ref 0.0–0.1)
Eosinophils Absolute: 0.3 10*3/uL (ref 0.0–0.7)
Eosinophils Relative: 4 %
HEMATOCRIT: 40.6 % (ref 36.0–46.0)
Hemoglobin: 13.5 g/dL (ref 12.0–15.0)
LYMPHS ABS: 2 10*3/uL (ref 0.7–4.0)
LYMPHS PCT: 28 %
MCH: 29.9 pg (ref 26.0–34.0)
MCHC: 33.3 g/dL (ref 30.0–36.0)
MCV: 89.8 fL (ref 78.0–100.0)
MONO ABS: 0.6 10*3/uL (ref 0.1–1.0)
MONOS PCT: 8 %
Neutro Abs: 4.3 10*3/uL (ref 1.7–7.7)
Neutrophils Relative %: 60 %
Platelets: 267 10*3/uL (ref 150–400)
RBC: 4.52 MIL/uL (ref 3.87–5.11)
RDW: 14.5 % (ref 11.5–15.5)
WBC: 7.1 10*3/uL (ref 4.0–10.5)

## 2015-05-12 LAB — BASIC METABOLIC PANEL
Anion gap: 11 (ref 5–15)
BUN: 22 mg/dL — AB (ref 6–20)
CALCIUM: 9.2 mg/dL (ref 8.9–10.3)
CHLORIDE: 109 mmol/L (ref 101–111)
CO2: 19 mmol/L — AB (ref 22–32)
CREATININE: 1.25 mg/dL — AB (ref 0.44–1.00)
GFR calc Af Amer: 53 mL/min — ABNORMAL LOW (ref >60)
GFR calc non Af Amer: 45 mL/min — ABNORMAL LOW (ref >60)
GLUCOSE: 98 mg/dL (ref 65–99)
Potassium: 4.6 mmol/L (ref 3.5–5.1)
Sodium: 139 mmol/L (ref 135–145)

## 2015-05-12 LAB — I-STAT TROPONIN, ED: Troponin i, poc: 0 ng/mL (ref 0.00–0.08)

## 2015-05-12 LAB — SEDIMENTATION RATE: SED RATE: 57 mm/h — AB (ref 0–22)

## 2015-05-12 LAB — C-REACTIVE PROTEIN: CRP: 3.5 mg/dL — ABNORMAL HIGH (ref <1.0)

## 2015-05-12 MED ORDER — SODIUM CHLORIDE 0.9% FLUSH
3.0000 mL | INTRAVENOUS | Status: DC | PRN
  Administered 2015-05-13: 3 mL via INTRAVENOUS
  Filled 2015-05-12: qty 3

## 2015-05-12 MED ORDER — POTASSIUM CHLORIDE IN NACL 20-0.9 MEQ/L-% IV SOLN
INTRAVENOUS | Status: DC
Start: 2015-05-12 — End: 2015-05-17
  Administered 2015-05-12 – 2015-05-13 (×2): via INTRAVENOUS
  Filled 2015-05-12 (×10): qty 1000

## 2015-05-12 MED ORDER — MORPHINE SULFATE (PF) 2 MG/ML IV SOLN
1.0000 mg | INTRAVENOUS | Status: DC | PRN
  Administered 2015-05-12 (×3): 2 mg via INTRAVENOUS
  Administered 2015-05-13: 4 mg via INTRAVENOUS
  Administered 2015-05-13 – 2015-05-14 (×2): 2 mg via INTRAVENOUS
  Administered 2015-05-15 – 2015-05-16 (×2): 4 mg via INTRAVENOUS
  Filled 2015-05-12: qty 2
  Filled 2015-05-12: qty 1
  Filled 2015-05-12: qty 2
  Filled 2015-05-12 (×2): qty 1
  Filled 2015-05-12: qty 2
  Filled 2015-05-12 (×2): qty 1

## 2015-05-12 MED ORDER — OXYCODONE HCL 5 MG PO TABS
10.0000 mg | ORAL_TABLET | Freq: Four times a day (QID) | ORAL | Status: DC | PRN
  Administered 2015-05-12: 15 mg via ORAL
  Administered 2015-05-13: 10 mg via ORAL
  Administered 2015-05-13 – 2015-05-16 (×10): 20 mg via ORAL
  Administered 2015-05-17: 10 mg via ORAL
  Filled 2015-05-12 (×2): qty 4
  Filled 2015-05-12: qty 2
  Filled 2015-05-12: qty 4
  Filled 2015-05-12: qty 3
  Filled 2015-05-12 (×2): qty 4
  Filled 2015-05-12: qty 2
  Filled 2015-05-12 (×6): qty 4

## 2015-05-12 MED ORDER — OXYBUTYNIN CHLORIDE 5 MG PO TABS
5.0000 mg | ORAL_TABLET | Freq: Two times a day (BID) | ORAL | Status: DC
  Administered 2015-05-12 – 2015-05-17 (×10): 5 mg via ORAL
  Filled 2015-05-12 (×10): qty 1

## 2015-05-12 MED ORDER — BUDESONIDE 0.5 MG/2ML IN SUSP
0.5000 mg | Freq: Two times a day (BID) | RESPIRATORY_TRACT | Status: DC
  Administered 2015-05-12 – 2015-05-17 (×10): 0.5 mg via RESPIRATORY_TRACT
  Filled 2015-05-12 (×10): qty 2

## 2015-05-12 MED ORDER — FENTANYL CITRATE (PF) 100 MCG/2ML IJ SOLN
50.0000 ug | Freq: Once | INTRAMUSCULAR | Status: AC
  Administered 2015-05-12: 50 ug via INTRAVENOUS
  Filled 2015-05-12: qty 2

## 2015-05-12 MED ORDER — ACETAMINOPHEN 325 MG PO TABS: 650.0000 mg | ORAL_TABLET | ORAL | Status: DC | PRN

## 2015-05-12 MED ORDER — LISINOPRIL 20 MG PO TABS
20.0000 mg | ORAL_TABLET | Freq: Every day | ORAL | Status: DC
  Administered 2015-05-12 – 2015-05-17 (×6): 20 mg via ORAL
  Filled 2015-05-12 (×6): qty 1

## 2015-05-12 MED ORDER — MORPHINE SULFATE (PF) 4 MG/ML IV SOLN
4.0000 mg | Freq: Once | INTRAVENOUS | Status: AC
  Administered 2015-05-12: 4 mg via INTRAVENOUS
  Filled 2015-05-12: qty 1

## 2015-05-12 MED ORDER — SODIUM CHLORIDE 0.9 % IV SOLN: 250.0000 mL | INTRAVENOUS | Status: DC

## 2015-05-12 MED ORDER — AMITRIPTYLINE HCL 25 MG PO TABS
50.0000 mg | ORAL_TABLET | Freq: Every day | ORAL | Status: DC
  Administered 2015-05-12 – 2015-05-16 (×5): 50 mg via ORAL
  Filled 2015-05-12 (×6): qty 2

## 2015-05-12 MED ORDER — PREDNISONE 5 MG PO TABS
10.0000 mg | ORAL_TABLET | Freq: Every day | ORAL | Status: DC
  Administered 2015-05-12 – 2015-05-17 (×6): 10 mg via ORAL
  Filled 2015-05-12 (×3): qty 2
  Filled 2015-05-12: qty 1
  Filled 2015-05-12 (×2): qty 2

## 2015-05-12 MED ORDER — PANTOPRAZOLE SODIUM 40 MG PO TBEC
40.0000 mg | DELAYED_RELEASE_TABLET | Freq: Every day | ORAL | Status: DC
  Administered 2015-05-12 – 2015-05-17 (×6): 40 mg via ORAL
  Filled 2015-05-12 (×6): qty 1

## 2015-05-12 MED ORDER — SODIUM CHLORIDE 0.9% FLUSH
3.0000 mL | Freq: Two times a day (BID) | INTRAVENOUS | Status: DC
Start: 2015-05-12 — End: 2015-05-17
  Administered 2015-05-12 – 2015-05-16 (×6): 3 mL via INTRAVENOUS

## 2015-05-12 MED ORDER — AMLODIPINE BESYLATE 5 MG PO TABS
5.0000 mg | ORAL_TABLET | Freq: Every day | ORAL | Status: DC
  Administered 2015-05-12 – 2015-05-17 (×6): 5 mg via ORAL
  Filled 2015-05-12 (×6): qty 1

## 2015-05-12 MED ORDER — ACETAMINOPHEN 650 MG RE SUPP: 650.0000 mg | RECTAL | Status: DC | PRN

## 2015-05-12 MED ORDER — BECLOMETHASONE DIPROPIONATE 80 MCG/ACT IN AERS: 2.0000 | INHALATION_SPRAY | Freq: Two times a day (BID) | RESPIRATORY_TRACT | Status: DC

## 2015-05-12 MED ORDER — ALBUTEROL SULFATE HFA 108 (90 BASE) MCG/ACT IN AERS: 2.0000 | INHALATION_SPRAY | Freq: Four times a day (QID) | RESPIRATORY_TRACT | Status: DC | PRN

## 2015-05-12 MED ORDER — HYDROMORPHONE HCL 1 MG/ML IJ SOLN
1.0000 mg | Freq: Once | INTRAMUSCULAR | Status: AC
  Administered 2015-05-12: 1 mg via INTRAVENOUS
  Filled 2015-05-12: qty 1

## 2015-05-12 MED ORDER — ONDANSETRON HCL 4 MG/2ML IJ SOLN
4.0000 mg | INTRAMUSCULAR | Status: DC | PRN
  Administered 2015-05-15 – 2015-05-16 (×3): 4 mg via INTRAVENOUS
  Filled 2015-05-12 (×3): qty 2

## 2015-05-12 MED ORDER — DEXAMETHASONE 4 MG PO TABS
4.0000 mg | ORAL_TABLET | Freq: Four times a day (QID) | ORAL | Status: DC
  Administered 2015-05-12 – 2015-05-17 (×19): 4 mg via ORAL
  Filled 2015-05-12 (×19): qty 1

## 2015-05-12 MED ORDER — DEXAMETHASONE SODIUM PHOSPHATE 4 MG/ML IJ SOLN
4.0000 mg | Freq: Four times a day (QID) | INTRAMUSCULAR | Status: DC
  Administered 2015-05-12: 4 mg via INTRAVENOUS
  Filled 2015-05-12 (×2): qty 1

## 2015-05-12 MED ORDER — ALBUTEROL SULFATE (2.5 MG/3ML) 0.083% IN NEBU: 2.5000 mg | INHALATION_SOLUTION | Freq: Four times a day (QID) | RESPIRATORY_TRACT | Status: DC | PRN

## 2015-05-12 MED ORDER — METHOCARBAMOL 500 MG PO TABS
500.0000 mg | ORAL_TABLET | Freq: Four times a day (QID) | ORAL | Status: DC | PRN
  Administered 2015-05-13 – 2015-05-16 (×3): 500 mg via ORAL
  Filled 2015-05-12 (×3): qty 1
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
## 2015-05-12 NOTE — ED Notes (Signed)
[facility] here to transport pt to [facility]. 

## 2015-05-12 NOTE — ED Provider Notes (Signed)
Dr Ronnald Ramp requested ortho consult while patient is in the hospital.  Dr Lorin Mercy is currently in the OR.  I left a message with the OR nurse and he will consult on the patient.  Dorie Rank, MD 05/12/15 [phone]
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
## 2015-05-12 NOTE — ED Notes (Signed)
[facility] was notified of pt's transfer to [facility] ED.

## 2015-05-12 NOTE — ED Notes (Signed)
All pt interventions performed by [employer] student nurse done under direct supervision of Carlyn Reichert RN.
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
## 2015-05-12 NOTE — Care Management Note (Signed)
Case Management Note  Patient Details  Name: Wen Su MRN: [id number] Date of Birth: Sep 23, 1955  Subjective/Objective:                    Action/Plan: Patient was admitted for numbness and weakness, s/p fall.  Recently underwent an ACDF with plating.  Admitted for pain control and plan for possible exploration on 05/13/15.  Will follow for discharge needs pending patient's progress and physician orders.  Expected Discharge Date:                  Expected Discharge Plan:     In-House Referral:     Discharge planning Services     Post Acute Care Choice:    Choice offered to:     DME Arranged:    DME Agency:     HH Arranged:    HH Agency:     Status of Service:  In process, will continue to follow  Medicare Important Message Given:    Date Medicare IM Given:    Medicare IM give by:    Date Additional Medicare IM Given:    Additional Medicare Important Message give by:     If discussed at [place] of Stay Meetings, dates discussed:    Additional CommentsRolm Baptise, RN 05/12/2015, 4:01 PM [phone]

## 2015-05-12 NOTE — ED Provider Notes (Signed)
I spoke to radiology concerning MRI findings I spoke to dr Vertell Limber with neurosurgery He reviewed MRI He requests that patient have ccollar He requests to have CT cspine nsgy will come see patient, please call back after CT cspine Pt updated on plan She is still in pain Her neuro exam is unchanged SHE CAN F/U WITH ORTHO AS OUTPATIENT FOR ANKLE INJURY CRITICAL CARE Performed by: Sharyon Cable Total critical care time: 33 minutes Critical care time was exclusive of separately billable procedures and treating other patients. Critical care was necessary to treat or prevent imminent or life-threatening deterioration. Critical care was time spent personally by me on the following activities: development of treatment plan with patient and/or surrogate as well as nursing, discussions with consultants, evaluation of patient's response to treatment, examination of patient, obtaining history from patient or surrogate, ordering and performing treatments and interventions, ordering and review of laboratory studies, ordering and review of radiographic studies, pulse oximetry and re-evaluation of patient's condition. PATIENT WITH POSTOP COMPLICATIONS WITH NEURO DEFICITS, REQUIRING RE-EVALUATIONS AND DISCUSSION WITH MULTIPLE CONSULTANT  Medications  morphine 4 MG/ML injection 4 mg (4 mg Intravenous Given 05/12/15 0032)  HYDROmorphone (DILAUDID) injection 1 mg (1 mg Intravenous Given 05/12/15 0218)  morphine 4 MG/ML injection 4 mg (4 mg Intravenous Given 05/12/15 0545)  fentaNYL (SUBLIMAZE) injection 50 mcg (50 mcg Intravenous Given 05/12/15 0636)     Ripley Fraise, MD 05/12/15 [phone]

## 2015-05-13 ENCOUNTER — Encounter (HOSPITAL_COMMUNITY): Payer: Self-pay | Admitting: Anesthesiology

## 2015-05-13 ENCOUNTER — Encounter (HOSPITAL_COMMUNITY)
Admission: EM | Disposition: A | Discharge status: Skilled Nursing Facility | Payer: Self-pay | Source: Home / Self Care | Attending: Neurological Surgery

## 2015-05-13 ENCOUNTER — Encounter (HOSPITAL_COMMUNITY): Payer: Self-pay | Admitting: Neurological Surgery

## 2015-05-13 SURGERY — CANCELLED PROCEDURE

## 2015-05-13 MED ORDER — MIDAZOLAM HCL 2 MG/2ML IJ SOLN
INTRAMUSCULAR | Status: AC
  Filled 2015-05-13: qty 2

## 2015-05-13 MED ORDER — PROPOFOL 10 MG/ML IV BOLUS
INTRAVENOUS | Status: AC
  Filled 2015-05-13: qty 20

## 2015-05-13 MED ORDER — FENTANYL CITRATE (PF) 250 MCG/5ML IJ SOLN
INTRAMUSCULAR | Status: AC
  Filled 2015-05-13: qty 5

## 2015-05-13 MED ORDER — OXYCODONE HCL 5 MG PO TABS: 5.0000 mg | ORAL_TABLET | Freq: Once | ORAL | Status: DC | PRN

## 2015-05-13 MED ORDER — OXYCODONE HCL 5 MG/5ML PO SOLN: 5.0000 mg | Freq: Once | ORAL | Status: DC | PRN

## 2015-05-13 MED ORDER — HYDROMORPHONE HCL 1 MG/ML IJ SOLN: 0.2500 mg | INTRAMUSCULAR | Status: DC | PRN

## 2015-05-13 MED ORDER — ROCURONIUM BROMIDE 50 MG/5ML IV SOLN
INTRAVENOUS | Status: AC
  Filled 2015-05-13: qty 2

## 2015-05-13 NOTE — Progress Notes (Signed)
Rehab Admissions Coordinator Note:  Patient was screened by Cleatrice Burke for appropriateness for an Inpatient Acute Rehab Consult per PT recommendation.  At this time, we are recommending Inpatient Rehab consult. Please place order.  Cleatrice Burke 05/13/2015, 4:43 PM  I can be reached at [phone].

## 2015-05-13 NOTE — Progress Notes (Signed)
Subjective: Doing well.  Surgery was canceled for neck.    Objective: Vital signs in last 24 hours: Temp:  [98 F (36.7 C)-98.4 F (36.9 C)] 98.4 F (36.9 C) (05/02 0600) Pulse Rate:  [76-94] 81 (05/02 0600) Resp:  [18] 18 (05/02 0600) BP: (102-155)/(62-82) 155/80 mmHg (05/02 0600) SpO2:  [95 %-100 %] 100 % (05/02 0600)  Intake/Output from previous day:   Intake/Output this shift:     Recent Labs  05/12/15 0027  HGB 13.5    Recent Labs  05/12/15 0027  WBC 7.1  RBC 4.52  HCT 40.6  PLT 267    Recent Labs  05/12/15 0027  NA 139  K 4.6  CL 109  CO2 19*  BUN 22*  CREATININE 1.25*  GLUCOSE 98  CALCIUM 9.2   No results for input(s): LABPT, INR in the last 72 hours.  Exam:  Alert and oriented.  Splint right LE intact.  Moves toes well.    Assessment/Plan: Will start PT.  Strict NWB right LE.  Elevate foot above heart level as much as possible.    [name] M 05/13/2015, 9:20 AM

## 2015-05-13 NOTE — Progress Notes (Signed)
Patient ID: [name], female   DOB: [birth year], 60 y.o.   MRN: [id number] Plan for right lateral malleolar nondisplaced ankle fracture will be cast application early next week by Orthopedic tech. ( Monday or Tuesday).  If she is transferred to rehab she should be in a SLFC and may be PWB 50 %.   If she puts more weight on leg in order to walk due to weakness from cervical problem then she will need weekly xray in cast to make sure the fracture does not shift and ankle mortice begins to widden. If there is displacement then a fibular plate will be needed to keep ankle in good position .    My phone [phone].  I will be back to look for pt next week.

## 2015-05-13 NOTE — Anesthesia Preprocedure Evaluation (Deleted)
Anesthesia Evaluation  Patient identified by MRN, date of birth, ID band Patient awake    Reviewed: Allergy & Precautions, NPO status , Patient's Chart, lab work & pertinent test results  Airway Mallampati: II   Neck ROM: full    Dental   Pulmonary shortness of breath, COPD, Current Smoker,    breath sounds clear to auscultation       Cardiovascular hypertension,  Rhythm:regular Rate:Normal     Neuro/Psych    GI/Hepatic GERD  ,  Endo/Other    Renal/GU stones     Musculoskeletal  (+) Arthritis ,   Abdominal   Peds  Hematology   Anesthesia Other Findings   Reproductive/Obstetrics                             Anesthesia Physical Anesthesia Plan  ASA: II  Anesthesia Plan: General   Post-op Pain Management:    Induction: Intravenous  Airway Management Planned: Oral ETT  Additional Equipment:   Intra-op Plan:   Post-operative Plan: Extubation in OR  Informed Consent: I have reviewed the patients History and Physical, chart, labs and discussed the procedure including the risks, benefits and alternatives for the proposed anesthesia with the patient or authorized representative who has indicated his/her understanding and acceptance.     Plan Discussed with: CRNA, Anesthesiologist and Surgeon  Anesthesia Plan Comments:         Anesthesia Quick Evaluation

## 2015-05-13 NOTE — Progress Notes (Signed)
PT Cancellation Note  Patient Details Name: [name] MRN: [id number] DOB: Oct 30, 1955   Cancelled Treatment:    Reason Eval/Treat Not Completed: Pain limiting ability to participate RN requesting to hold PT eval this AM due to pain.  Attempted in PM and awaiting pain medication to start working prior to PT evaluation. Will follow.   [name] A Sefora Tietje 05/13/2015, 1:15 PM Wray Kearns, [place], DPT [phone]
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
## 2015-05-13 NOTE — Progress Notes (Signed)
Patient ID: [name], female   DOB: [birth year], 60 y.o.   MRN: [id number] We are going to cancel her surgery for now. She seems to be doing much better. Her only pain is in her sternal region when she flexes her neck. She has no radicular pain. She still has some residual numbness in the fingers. Her strength has improved significantly. She is using her hands well and appears to not have clumsiness today. Sepsis or still 4 out of 5. Hand grip is 4 out of 5 but much more functional and less clumsy than yesterday. She now has full antigravity strength of both lower extremities, even the one with the cast. She feels better, and is asking that we wait on further surgery to see how things go over time while in the hospital. I think that is very reasonable. We will go ahead and get therapy started on her with weightbearing as per the orthopedic consultant.   I explained to her that the only thing I cannot fully rule out his infection. However infection is exceedingly uncommon after ACDF surgery. Her CRP and sedimentation rate are elevated but this is completely nonspecific. She has accepted this and still wants to cancel her surgery for now. Again I think it is quite reasonable, and at this point it appears that the surgery may offer more risks than benefits. I have discussed this with one of my partners who agrees.

## 2015-05-13 NOTE — Progress Notes (Signed)
Assisted the patient OOB to [facility] per request to go to the bathroom. Patient able to stand with one person assist with minimal assistance. No reports of pain at this time. MRSA contact isolation maintained. Callbell in reach. Will continue to monitor.

## 2015-05-13 NOTE — Progress Notes (Signed)
Patient ID: [name], female   DOB: 1955/03/14, 60 y.o.   MRN: [id number] I stopped by and saw her at 7:30 pm last night and she was somewhat better with less numbness, using her hands to feed herself (still felt she was dropping things with her left hand), less pain. She is NWB because of her ankle for now. Collar in place. Using hands fairly well. I explained that I planned on cervical exploration this am. She understands the procedure has risks, including but not limited to bleeding, infection, CSF leak, nerve injury, SCI, numbness, weakness, paralysis, leak of relief, worsening, injury of the trachea, esophagus, carotid, venous structures, etc, need for further surgery, need for corpectomy, difficulty with graft or plate re-insertion, vertebral body fracture, need for posterior instrumentation either today or later, and anesthesia risks (death, DVT, pneumonia, etc). She understands and agrees to proceed.

## 2015-05-14 ENCOUNTER — Encounter (HOSPITAL_COMMUNITY): Payer: Self-pay | Admitting: Physical Medicine and Rehabilitation

## 2015-05-14 DIAGNOSIS — G8250 Quadriplegia, unspecified: Secondary | ICD-10-CM

## 2015-05-14 DIAGNOSIS — M47812 Spondylosis without myelopathy or radiculopathy, cervical region: Secondary | ICD-10-CM

## 2015-05-14 DIAGNOSIS — M4802 Spinal stenosis, cervical region: Secondary | ICD-10-CM

## 2015-05-14 LAB — RAPID URINE DRUG SCREEN, HOSP PERFORMED
Amphetamines: NOT DETECTED
BARBITURATES: NOT DETECTED
Benzodiazepines: NOT DETECTED
COCAINE: NOT DETECTED
OPIATES: POSITIVE — AB
Tetrahydrocannabinol: NOT DETECTED

## 2015-05-14 MED ORDER — POLYETHYLENE GLYCOL 3350 17 G PO PACK
17.0000 g | PACK | Freq: Every day | ORAL | Status: DC | PRN
  Administered 2015-05-14 – 2015-05-17 (×3): 17 g via ORAL
  Filled 2015-05-14 (×3): qty 1
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
## 2015-05-14 NOTE — Clinical Social Work Note (Signed)
Clinical Social Work Assessment  Patient Details  Name: [name] MRN: [id number] Date of Birth: 1955/03/09  Date of referral:  05/14/15               Reason for consult:  Facility Placement                Permission sought to share information with:  Family Supports, Chartered certified accountant granted to share information::  Yes, Verbal Permission Granted  Name::     [name]  Agency::  [facility] SNFs  Relationship::  sister  Contact Information:     Housing/Transportation Living arrangements for the past 2 months:  Single Family Home Source of Information:  Patient Patient Interpreter Needed:  None Criminal Activity/Legal Involvement Pertinent to Current Situation/Hospitalization:  No - Comment as needed Significant Relationships:  Siblings, Adult Children Lives with:  Siblings (sister) Do you feel safe going back to the place where you live?  No Need for family participation in patient care:  No (Coment)  Care giving concerns:  Pt lives at home with her sister who will be on vacation until May 15th- pt would not have sufficient support to return home.   Social Worker assessment / plan:  CSW spoke with pt concerning PT recommendation for rehab- explained SNF and SNF referral process.  CSW discussed Medicaid requirement for pt to stay 30 days at a facility in order to cover their stay.  Pt expressed understanding and states she has never been to SNF before but has received home services in the past.  Employment status:    Insurance information:  Medicaid [place] PT Recommendations:  Inpatient Rehab Consult Information / Referral to community resources:  [place]  Patient/Family's Response to care:  Pt is agreeable to SNF placement at this time- pt is very realistic about her current medical/physical needs and understands she would not be safe to return home.  Patient/Family's Understanding of and Emotional Response to Diagnosis, Current  Treatment, and Prognosis:  No questions or concerns at this time- pt is very hopeful she will fully regain her mobility and be able to return home after a 30 day SNF stay.  Emotional Assessment Appearance:  Appears stated age Attitude/Demeanor/Rapport:    Affect (typically observed):  Appropriate, Pleasant, Accepting Orientation:  Oriented to Self, Oriented to Place, Oriented to  Time, Oriented to Situation Alcohol / Substance use:  Not Applicable Psych involvement (Current and /or in the community):  No (Comment)  Discharge Needs  Concerns to be addressed:  Care Coordination, Discharge Planning Concerns Readmission within the last 30 days:    Current discharge risk:  Physical Impairment Barriers to Discharge:  Continued Medical Work up   [employer], LCSW 05/14/2015, 9:02 AM
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
## 2015-05-14 NOTE — NC FL2 (Signed)
[place] MEDICAID FL2 LEVEL OF CARE SCREENING TOOL     IDENTIFICATION  Patient Name: Veronica [name] Birthdate: [birth year] Sex: female Admission Date (Current Location): 05/11/2015  [facility] and [place] Number:  Veronica [name] and Address:  [facility]. Veronica [name], [name] [address] Veronica [name], [name], [place] [address]      Provider Number: [id number]  Attending Physician Name and Address:  [name], MD  Relative Name and Phone Number:       Current Level of Care: Hospital Recommended Level of Care: [name] Prior Approval Number:    Date Approved/Denied:   PASRR Number: [id number]  Discharge Plan: SNF    Current Diagnoses: Patient Active Problem List   Diagnosis Date Noted  . Spinal stenosis in cervical region 05/12/2015  . S/P cervical spinal fusion 04/17/2015  . Acute cystitis without hematuria   . Vomiting   . Constipation 02/17/2015  . CAP (community acquired pneumonia) 02/15/2015  . Encephalopathy, metabolic [id number]  . Sepsis ([place]) 10/28/2014  . SIRS (systemic inflammatory response syndrome) ([place]) 10/28/2014  . Sore throat 10/28/2014  . Acute encephalopathy 10/28/2014  . Metabolic acidosis   . Leukocytosis   . Hypotension 09/10/2014  . Dehydration 09/10/2014  . Chest pain at rest 09/09/2014  . Tobacco abuse 09/09/2014  . HLD (hyperlipidemia) 09/09/2014  . COPD (chronic obstructive pulmonary disease) ([place]) 09/09/2014  . GERD (gastroesophageal reflux disease) 09/09/2014  . Chest pain 08/20/2014  . Nausea & vomiting 08/20/2014  . CKD (chronic kidney disease) stage 3, GFR 30-59 ml/min 08/20/2014  . UTI (lower urinary tract infection) 08/20/2014  . Pain in the chest   . Pain 02/11/2013  . Cocaine abuse 02/11/2013  . Rheumatoid arthritis flare ([place]) 02/11/2013  . Acute renal failure ([place]) 02/11/2013  . AKI (acute kidney injury) ([place]) 02/11/2013  . Hiatal hernia with gastroesophageal reflux 10/11/2012  . Abdominal mass  10/08/2012  . Knee pain 10/08/2012  . HTN (hypertension), benign 10/08/2012  . Pancreatic mass 10/07/2012  . Abdominal pain 10/07/2012  . Rheumatoid arthritis ([place])     Orientation RESPIRATION BLADDER Height & Weight     Self, Time, Situation, Place  Normal Continent Weight:   Height:     BEHAVIORAL SYMPTOMS/MOOD NEUROLOGICAL BOWEL NUTRITION STATUS      Continent Diet (see DC summary)  AMBULATORY STATUS COMMUNICATION OF NEEDS Skin   Extensive Assist Verbally Normal                       Personal Care Assistance Level of Assistance  Bathing, Dressing Bathing Assistance: Limited assistance (lower body assistance)   Dressing Assistance: Limited assistance (lower body assistance)     Functional Limitations Info             SPECIAL CARE FACTORS FREQUENCY  PT (By licensed PT), OT (By licensed OT)     PT Frequency: 5/wk OT Frequency: 5/wk            Contractures      Additional Factors Info  Code Status, Allergies, Isolation Precautions Code Status Info: FULL Allergies Info: Iohexol, Levofloxacin In D5w, Zofran, Pork-derived Products, Heparin, Penicillins     Isolation Precautions Info: MRSA     Current Medications (05/14/2015):  This is the current hospital active medication list Current Facility-Administered Medications  Medication Dose Route Frequency Provider Last Rate Last Dose  . 0.9 %  sodium chloride infusion  250 mL Intravenous Continuous [name], MD      .  0.9 % NaCl with KCl 20 mEq/ L  infusion   Intravenous Continuous [name], MD 75 mL/hr at 05/13/15 [phone]    . acetaminophen (TYLENOL) tablet 650 mg  650 mg Oral Q4H PRN [name], MD       Or  . acetaminophen (TYLENOL) suppository 650 mg  650 mg Rectal Q4H PRN [name], MD      . albuterol (PROVENTIL) (2.5 MG/3ML) 0.083% nebulizer solution 2.5 mg  2.5 mg Nebulization Q6H PRN [name], MD      . amitriptyline (ELAVIL) tablet 50 mg  50 mg Oral QHS [name], MD   50 mg  at 05/13/15 2112  . amLODipine (NORVASC) tablet 5 mg  5 mg Oral Daily [name], MD   5 mg at 05/13/15 0919  . budesonide (PULMICORT) nebulizer solution 0.5 mg  0.5 mg Nebulization BID [name], MD   0.5 mg at 05/14/15 0847  . dexamethasone (DECADRON) injection 4 mg  4 mg Intravenous Q6H [name], MD   4 mg at 05/12/15 1820   Or  . dexamethasone (DECADRON) tablet 4 mg  4 mg Oral Q6H [name], MD   4 mg at 05/14/15 0601  . lisinopril (PRINIVIL,ZESTRIL) tablet 20 mg  20 mg Oral Daily [name], MD   20 mg at 05/13/15 0919  . methocarbamol (ROBAXIN) tablet 500 mg  500 mg Oral Q6H PRN [name], MD   500 mg at 05/14/15 [id number]  . morphine 2 MG/ML injection 1-4 mg  1-4 mg Intravenous Q3H PRN [name], MD   2 mg at 05/14/15 [id number]  . ondansetron (ZOFRAN) injection 4 mg  4 mg Intravenous Q4H PRN [name], MD      . oxybutynin [facility]) tablet 5 mg  5 mg Oral BID [name], MD   5 mg at 05/13/15 2112  . oxyCODONE (Oxy IR/ROXICODONE) immediate release tablet 10-20 mg  10-20 mg Oral Q6H PRN [name], MD   20 mg at 05/14/15 0601  . pantoprazole (PROTONIX) EC tablet 40 mg  40 mg Oral Daily [name], MD   40 mg at 05/13/15 0919  . predniSONE (DELTASONE) tablet 10 mg  10 mg Oral Daily [name], MD   10 mg at 05/13/15 0919  . sodium chloride flush (NS) 0.9 % injection 3 mL  3 mL Intravenous Q12H [name], MD   3 mL at 05/12/15 2127  . sodium chloride flush (NS) 0.9 % injection 3 mL  3 mL Intravenous PRN [name], MD   3 mL at 05/13/15 2328     Discharge Medications: Please see discharge summary for a list of discharge medications.  Relevant Imaging Results:  Relevant Lab Results:   Additional Information SS#: [id number]  Veronica [name], Veronica [name]

## 2015-05-14 NOTE — Progress Notes (Signed)
Pm NA and AM NA at bedside assisting patient with bed change.

## 2015-05-15 MED ORDER — MAGNESIUM CITRATE PO SOLN
1.0000 | Freq: Once | ORAL | Status: AC
  Administered 2015-05-15: 1 via ORAL
  Filled 2015-05-15: qty 296

## 2015-05-15 NOTE — Progress Notes (Signed)
Inpatient Rehabilitation  I have called patient's sister per her request and clarified that she will be out of town on vacation 5/5-5/15.  Additionally, she stated that patient will need to be able to use a walker to gain access to the bathroom prior to discharge home with her.  I do not have a rehab bed available today and have notified RN CM and CSW.  If the patient remains in house, plan to follow up tomorrow.    Carmelia Roller., CCC/SLP Admission Coordinator  [place]  Cell [phone]

## 2015-05-15 NOTE — Progress Notes (Signed)
Patient ID: [name], female   DOB: 01-Aug-1955, 60 y.o.   MRN: [id number] Subjective: Patient reports nausea today, otherwise no change in strength or numbness  Objective: Vital signs in last 24 hours: Temp:  [98 F (36.7 C)-98.6 F (37 C)] 98.5 F (36.9 C) (05/04 0943) Pulse Rate:  [77-88] 87 (05/04 0943) Resp:  [17-20] 20 (05/04 0943) BP: (120-151)/(71-79) 120/78 mmHg (05/04 0943) SpO2:  [94 %-98 %] 96 % (05/04 0943)  Intake/Output from previous day:   Intake/Output this shift:    Neurologic: Grossly normal, belly soft, moving all ext well  Lab Results: Lab Results  Component Value Date   WBC 7.1 05/12/2015   HGB 13.5 05/12/2015   HCT 40.6 05/12/2015   MCV 89.8 05/12/2015   PLT 267 05/12/2015   Lab Results  Component Value Date   INR 1.08 02/14/2015   BMET Lab Results  Component Value Date   NA 139 05/12/2015   K 4.6 05/12/2015   CL 109 05/12/2015   CO2 19* 05/12/2015   GLUCOSE 98 05/12/2015   BUN 22* 05/12/2015   CREATININE 1.25* 05/12/2015   CALCIUM 9.2 05/12/2015    Studies/Results: No results found.  Assessment/Plan: Seems to be doing ok. ready for rehab from a medical standpoint   LOS: 3 days    Arta Stump S 05/15/2015, 2:26 PM

## 2015-05-15 NOTE — Progress Notes (Signed)
Inpatient Rehabilitation  Met with patient to discuss team's recommendation for IP Rehab; patient in agreement with plan.  However, I await notice for bed availability today.  Plan to follow along for timing of medical readiness and bed availability.  Discussed with RN CM and CSW; aware of in place SNF backup plan.  Please call with questions.  Carmelia Roller., CCC/SLP Admission Coordinator  [facility]  Cell [phone]

## 2015-05-16 MED ORDER — DEXAMETHASONE 4 MG PO TABS: 4.0000 mg | ORAL_TABLET | Freq: Four times a day (QID) | ORAL | Status: DC

## 2015-05-16 MED ORDER — MAGNESIUM HYDROXIDE 400 MG/5ML PO SUSP
960.0000 mL | Freq: Once | ORAL | Status: AC
  Administered 2015-05-16: 960 mL via RECTAL
  Filled 2015-05-16: qty 240

## 2015-05-16 MED ORDER — FLEET ENEMA 7-19 GM/118ML RE ENEM
1.0000 | ENEMA | Freq: Every day | RECTAL | Status: DC | PRN
  Administered 2015-05-16: 1 via RECTAL
  Filled 2015-05-16: qty 1

## 2015-05-16 NOTE — Discharge Summary (Signed)
Physician Discharge Summary  Patient ID: Amye Kupferman MRN: [id number] DOB/AGE: May 06, 1955 60 y.o.  Admit date: 05/11/2015 Discharge date: 05/16/2015  Admission Diagnoses: Spinal stenosis  Discharge Diagnoses: Same Active Problems:   Spinal stenosis in cervical region   Discharged Condition: Stable  Hospital Course:  Mrs. Dazjah Kissam is a 60 y.o. female who underwent previous ACDF, readmitted a few days ago with worsened numbness/weakness of the arms and legs and a fall leading to right ankle fracture. MRI demonstrated a ventral epidural process and repeat surgery was considered, however the next morning she had significantly improved. She therefore continued conservative treatment with steroids and therapy and continued to improve. She was working with PT/OT and CIR was recommended.  Discharge Exam: Blood pressure 132/71, pulse 86, temperature 99.8 F (37.7 C), temperature source Oral, resp. rate 20, SpO2 95 %. Awake, alert, oriented Speech fluent, appropriate CN grossly intact 5/5 BUE with mild grip weakness RLE casted Moving LLE well  Disposition: 01-Home or Self Care     Medication List    STOP taking these medications        nicotine 21 mg/24hr patch  Commonly known as:  NICODERM CQ - dosed in mg/24 hours     oxyCODONE-acetaminophen 5-325 MG tablet  Commonly known as:  PERCOCET/ROXICET      TAKE these medications        albuterol 108 (90 Base) MCG/ACT inhaler  Commonly known as:  PROVENTIL HFA;VENTOLIN HFA  Inhale 2 puffs into the lungs every 6 (six) hours as needed for wheezing or shortness of breath.     amitriptyline 50 MG tablet  Commonly known as:  ELAVIL  Take 50 mg by mouth at bedtime.     amLODipine 5 MG tablet  Commonly known as:  NORVASC  Take 5 mg by mouth daily.     atorvastatin 40 MG tablet  Commonly known as:  LIPITOR  Take 1 tablet (40 mg total) by mouth daily at 6 PM.     beclomethasone 80 MCG/ACT inhaler  Commonly known as:   QVAR  Inhale 2 puffs into the lungs 2 (two) times daily.     dexamethasone 4 MG tablet  Commonly known as:  DECADRON  Take 1 tablet (4 mg total) by mouth 4 (four) times daily.     ibuprofen 200 MG tablet  Commonly known as:  ADVIL,MOTRIN  Take 200 mg by mouth every 6 (six) hours as needed (for pain.).     lisinopril 20 MG tablet  Commonly known as:  PRINIVIL,ZESTRIL  Take 20 mg by mouth daily.     methocarbamol 500 MG tablet  Commonly known as:  ROBAXIN  Take 1 tablet (500 mg total) by mouth every 6 (six) hours as needed for muscle spasms.     oxybutynin 5 MG tablet  Commonly known as:  DITROPAN  Take 5 mg by mouth 2 (two) times daily.     Oxycodone HCl 10 MG Tabs  Take 10-20 mg by mouth every 6 (six) hours as needed.     pantoprazole 40 MG tablet  Commonly known as:  PROTONIX  Take 1 tablet (40 mg total) by mouth daily.     predniSONE 10 MG tablet  Commonly known as:  DELTASONE  Take 1 tablet (10 mg total) by mouth daily. Continuous course           Follow-up Information    Follow up with JONES,DAVID S, MD In 2 weeks.   Specialty:  Neurosurgery   Contact information:  [address] [place] [address] [phone]       Signed: Jairo Ben 05/16/2015, 4:39 PM
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
## 2015-05-16 NOTE — Progress Notes (Signed)
Pt has bed at [facility]. CM called and left message with Elisa Dr Ronnald Ramp secretary.

## 2015-05-16 NOTE — Progress Notes (Addendum)
Inpatient Rehabilitation  I do not have an IP Rehab bed available to offer Ms. Kitts today or tomorrow.  Notified RN CM and CSW.  If patient remains in house over the weekend my co-worker will follow up Monday.  Please call with questions.    Carmelia Roller., CCC/SLP Admission Coordinator  [facility]  Cell [phone]

## 2015-05-16 NOTE — Progress Notes (Signed)
Patient ID: [name], female   DOB: [birth year], 60 y.o.   MRN: [id number]  Patient seen this morning by me.  Right ankle doing ok.  Pain controlled.  Awaiting transfer to CIR.   Exam:  Splint intact.  Moves toes well.   Plan:  Will have ortho tech apply short leg well padded fiberglass cast.  F/u with Dr Lorin Mercy in 2-3 weeks.
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
## 2015-05-16 NOTE — Progress Notes (Signed)
Per SNF facility, discharge occurred too late to accept patient with ambulance being behind. Patient will discharge to [facility] on Saturday.  Percell Locus Jazlene Bares LCSWA [phone]
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
## 2015-05-17 MED ORDER — SENNOSIDES-DOCUSATE SODIUM 8.6-50 MG PO TABS
1.0000 | ORAL_TABLET | Freq: Two times a day (BID) | ORAL | Status: DC
  Administered 2015-05-17: 1 via ORAL
  Filled 2015-05-17: qty 1

## 2015-05-17 MED ORDER — OXYCODONE HCL 10 MG PO TABS: 10.0000 mg | ORAL_TABLET | Freq: Four times a day (QID) | ORAL | Status: DC | PRN

## 2015-05-17 NOTE — Clinical Social Work Placement (Signed)
   CLINICAL SOCIAL WORK PLACEMENT  NOTE  Date:  05/17/2015  Patient Details  Name: [name] MRN: [id number] Date of Birth: 1955/12/25  Clinical Social Work is seeking post-discharge placement for this patient at the [place] level of care (*CSW will initial, date and re-position this form in  chart as items are completed):  Yes   Patient/family provided with [facility] Work Department's list of facilities offering this level of care within the geographic area requested by the patient (or if unable, by the patient's family).  Yes   Patient/family informed of their freedom to choose among providers that offer the needed level of care, that participate in Medicare, Medicaid or managed care program needed by the patient, have an available bed and are willing to accept the patient.  Yes   Patient/family informed of [facility]'s ownership interest in [facility] and [facility], as well as of the fact that they are under no obligation to receive care at these facilities.  PASRR submitted to EDS on 05/14/15     PASRR number received on 05/14/15     Existing PASRR number confirmed on       FL2 transmitted to all facilities in geographic area requested by pt/family on 05/14/15     FL2 transmitted to all facilities within larger geographic area on       Patient informed that his/her managed care company has contracts with or will negotiate with certain facilities, including the following:        Yes   Patient/family informed of bed offers received.  Patient chooses bed at [facility]     Physician recommends and patient chooses bed at      Patient to be transferred to [facility] on 05/17/15.  Patient to be transferred to facility by Ambulance [facility])     Patient family notified on 05/17/15 of transfer.  Name of family member notified:  Patient aware- alert and oriented. She states she has notified  her sister of d/c plan.     PHYSICIAN Please sign FL2, Please prepare priority discharge summary, including medications, Please prepare prescriptions     Additional Comment: Ok for d/c today per MD. Nursing notified to call report.  EMS arranged. Patient is agreeable to d/c- however she is complaining of stomach pain due to bowel movement issues.  Discussed with RN who will follow up in report to SNF. Patient has received treatment for same and RN indicated that patient has had reported BM's. SNF notified Gayla Medicus, RN)   No further CSW needs identified.  CSW signing off.     _______________________________________________ Williemae Area, LCSW 05/17/2015, 12:22 PM

## 2015-05-17 NOTE — Progress Notes (Signed)
Patient ready for discharge to SNF; report called to RN at [facility].  Report given that patient has had laxative interventions with moderate results today. Discharge instructions reviewed with patient;Rx's sent with transport. Patient discharged via ambulance transport.

## 2015-05-17 NOTE — Progress Notes (Signed)
Filed Vitals:   05/16/15 2144 05/17/15 0209 05/17/15 0601 05/17/15 0831  BP: 124/77 136/74 150/84   Pulse: 77 78 72   Temp: 99 F (37.2 C) 97.7 F (36.5 C) 98.1 F (36.7 C)   TempSrc: Oral Oral Oral   Resp: 18 18 18    SpO2: 98% 100% 100% 100%    For transfer to this SNF today. Discharge summary completed. Rx signed for narcotic.   Plan: Discharge to SNF.  Hosie Spangle, MD 05/17/2015, 9:18 AM

## 2015-05-19 ENCOUNTER — Non-Acute Institutional Stay (SKILLED_NURSING_FACILITY): Payer: Medicaid Other | Admitting: Internal Medicine

## 2015-05-19 DIAGNOSIS — M4802 Spinal stenosis, cervical region: Secondary | ICD-10-CM

## 2015-05-19 DIAGNOSIS — I10 Essential (primary) hypertension: Secondary | ICD-10-CM | POA: Diagnosis not present

## 2015-05-19 DIAGNOSIS — K219 Gastro-esophageal reflux disease without esophagitis: Secondary | ICD-10-CM

## 2015-05-19 DIAGNOSIS — E785 Hyperlipidemia, unspecified: Secondary | ICD-10-CM

## 2015-05-19 DIAGNOSIS — F1419 Cocaine abuse with unspecified cocaine-induced disorder: Secondary | ICD-10-CM

## 2015-05-19 DIAGNOSIS — J410 Simple chronic bronchitis: Secondary | ICD-10-CM | POA: Diagnosis not present
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
## 2015-05-24 ENCOUNTER — Encounter (HOSPITAL_COMMUNITY): Payer: Self-pay | Admitting: Emergency Medicine

## 2015-05-24 ENCOUNTER — Emergency Department (HOSPITAL_COMMUNITY)
Admission: EM | Admit: 2015-05-24 | Discharge: 2015-05-25 | Disposition: A | Discharge status: Home / Self Care | Payer: Medicaid Other | Attending: Emergency Medicine | Admitting: Emergency Medicine

## 2015-05-24 ENCOUNTER — Encounter: Payer: Self-pay | Admitting: Internal Medicine

## 2015-05-24 DIAGNOSIS — Z880 Allergy status to penicillin: Secondary | ICD-10-CM | POA: Diagnosis not present

## 2015-05-24 DIAGNOSIS — E860 Dehydration: Secondary | ICD-10-CM | POA: Insufficient documentation

## 2015-05-24 DIAGNOSIS — I129 Hypertensive chronic kidney disease with stage 1 through stage 4 chronic kidney disease, or unspecified chronic kidney disease: Secondary | ICD-10-CM | POA: Insufficient documentation

## 2015-05-24 DIAGNOSIS — E872 Acidosis, unspecified: Secondary | ICD-10-CM

## 2015-05-24 DIAGNOSIS — N179 Acute kidney failure, unspecified: Secondary | ICD-10-CM | POA: Insufficient documentation

## 2015-05-24 DIAGNOSIS — F17210 Nicotine dependence, cigarettes, uncomplicated: Secondary | ICD-10-CM | POA: Diagnosis not present

## 2015-05-24 DIAGNOSIS — N189 Chronic kidney disease, unspecified: Secondary | ICD-10-CM | POA: Diagnosis not present

## 2015-05-24 DIAGNOSIS — Z87442 Personal history of urinary calculi: Secondary | ICD-10-CM | POA: Insufficient documentation

## 2015-05-24 DIAGNOSIS — R079 Chest pain, unspecified: Secondary | ICD-10-CM | POA: Diagnosis present

## 2015-05-24 DIAGNOSIS — M069 Rheumatoid arthritis, unspecified: Secondary | ICD-10-CM | POA: Diagnosis not present

## 2015-05-24 DIAGNOSIS — E875 Hyperkalemia: Secondary | ICD-10-CM | POA: Insufficient documentation

## 2015-05-24 DIAGNOSIS — Z8669 Personal history of other diseases of the nervous system and sense organs: Secondary | ICD-10-CM | POA: Diagnosis not present

## 2015-05-24 DIAGNOSIS — Z7952 Long term (current) use of systemic steroids: Secondary | ICD-10-CM | POA: Insufficient documentation

## 2015-05-24 DIAGNOSIS — Z7951 Long term (current) use of inhaled steroids: Secondary | ICD-10-CM | POA: Insufficient documentation

## 2015-05-24 DIAGNOSIS — J449 Chronic obstructive pulmonary disease, unspecified: Secondary | ICD-10-CM | POA: Diagnosis not present

## 2015-05-24 DIAGNOSIS — Z8701 Personal history of pneumonia (recurrent): Secondary | ICD-10-CM | POA: Insufficient documentation

## 2015-05-24 DIAGNOSIS — Z79899 Other long term (current) drug therapy: Secondary | ICD-10-CM | POA: Diagnosis not present

## 2015-05-24 DIAGNOSIS — K219 Gastro-esophageal reflux disease without esophagitis: Secondary | ICD-10-CM | POA: Diagnosis not present

## 2015-05-24 DIAGNOSIS — R112 Nausea with vomiting, unspecified: Secondary | ICD-10-CM

## 2015-05-24 LAB — CBC
HCT: 35.3 % — ABNORMAL LOW (ref 36.0–46.0)
Hemoglobin: 12.1 g/dL (ref 12.0–15.0)
MCH: 31.3 pg (ref 26.0–34.0)
MCHC: 34.3 g/dL (ref 30.0–36.0)
MCV: 91.2 fL (ref 78.0–100.0)
PLATELETS: 219 10*3/uL (ref 150–400)
RBC: 3.87 MIL/uL (ref 3.87–5.11)
RDW: 13.7 % (ref 11.5–15.5)
WBC: 5.3 10*3/uL (ref 4.0–10.5)

## 2015-05-24 LAB — BASIC METABOLIC PANEL
ANION GAP: 10 (ref 5–15)
BUN: 57 mg/dL — ABNORMAL HIGH (ref 6–20)
CO2: 18 mmol/L — ABNORMAL LOW (ref 22–32)
Calcium: 8.4 mg/dL — ABNORMAL LOW (ref 8.9–10.3)
Chloride: 103 mmol/L (ref 101–111)
Creatinine, Ser: 1.49 mg/dL — ABNORMAL HIGH (ref 0.44–1.00)
GFR, EST AFRICAN AMERICAN: 43 mL/min — AB (ref >60)
GFR, EST NON AFRICAN AMERICAN: 37 mL/min — AB (ref >60)
GLUCOSE: 127 mg/dL — AB (ref 65–99)
POTASSIUM: 5.4 mmol/L — AB (ref 3.5–5.1)
Sodium: 131 mmol/L — ABNORMAL LOW (ref 135–145)

## 2015-05-24 LAB — I-STAT TROPONIN, ED: TROPONIN I, POC: 0.03 ng/mL (ref 0.00–0.08)

## 2015-05-24 MED ORDER — PANTOPRAZOLE SODIUM 40 MG IV SOLR
40.0000 mg | Freq: Once | INTRAVENOUS | Status: AC
  Administered 2015-05-25: 40 mg via INTRAVENOUS
  Filled 2015-05-24: qty 40

## 2015-05-24 MED ORDER — GI COCKTAIL ~~LOC~~
30.0000 mL | Freq: Once | ORAL | Status: AC
  Administered 2015-05-25: 30 mL via ORAL
  Filled 2015-05-24: qty 30

## 2015-05-24 MED ORDER — PROMETHAZINE HCL 25 MG PO TABS
25.0000 mg | ORAL_TABLET | Freq: Once | ORAL | Status: AC
  Administered 2015-05-25: 25 mg via ORAL
  Filled 2015-05-24: qty 1

## 2015-05-24 NOTE — Assessment & Plan Note (Signed)
SNF - not stated as uncontrolled ; cont lipitor 40 mg

## 2015-05-24 NOTE — Assessment & Plan Note (Signed)
SNF - Noted, explains a lot

## 2015-05-24 NOTE — ED Notes (Signed)
Pt arrives by [facility] from [place] with c/o chest pain and pressure. Pt has had n/v x1 week. Chest pain started tonight with burning in throat as well, both worsen after pt vomits. Facility has been giving zofran, pt stated it has not been effective. EMS  Placed 22g in left hand, 4mg  Zofran given as well as 1 nitro. Chest pain relieved by nitro.

## 2015-05-24 NOTE — Assessment & Plan Note (Signed)
SNF - chronic and stable; cont protonix

## 2015-05-24 NOTE — Assessment & Plan Note (Signed)
SNF - cont scheduled QVAR and prn alb MDI

## 2015-05-24 NOTE — Assessment & Plan Note (Addendum)
SNF - resolving with steroids; admit for OT/PT and continue decadron
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
## 2015-05-24 NOTE — Assessment & Plan Note (Signed)
SNF - controlled on norvasc 5 mg and lisinopril 20 mg; plan - cont current meds

## 2015-05-25 ENCOUNTER — Emergency Department (HOSPITAL_COMMUNITY): Payer: Medicaid Other

## 2015-05-25 LAB — BASIC METABOLIC PANEL
ANION GAP: 10 (ref 5–15)
BUN: 54 mg/dL — ABNORMAL HIGH (ref 6–20)
CALCIUM: 8.3 mg/dL — AB (ref 8.9–10.3)
CO2: 18 mmol/L — ABNORMAL LOW (ref 22–32)
Chloride: 104 mmol/L (ref 101–111)
Creatinine, Ser: 1.36 mg/dL — ABNORMAL HIGH (ref 0.44–1.00)
GFR, EST AFRICAN AMERICAN: 48 mL/min — AB (ref >60)
GFR, EST NON AFRICAN AMERICAN: 41 mL/min — AB (ref >60)
Glucose, Bld: 115 mg/dL — ABNORMAL HIGH (ref 65–99)
Potassium: 5.2 mmol/L — ABNORMAL HIGH (ref 3.5–5.1)
SODIUM: 132 mmol/L — AB (ref 135–145)

## 2015-05-25 LAB — HEPATIC FUNCTION PANEL
ALK PHOS: 83 U/L (ref 38–126)
ALT: 20 U/L (ref 14–54)
AST: 15 U/L (ref 15–41)
Albumin: 2.7 g/dL — ABNORMAL LOW (ref 3.5–5.0)
TOTAL PROTEIN: 5.8 g/dL — AB (ref 6.5–8.1)
Total Bilirubin: 0.5 mg/dL (ref 0.3–1.2)

## 2015-05-25 LAB — I-STAT TROPONIN, ED: Troponin i, poc: 0.02 ng/mL (ref 0.00–0.08)

## 2015-05-25 LAB — LIPASE, BLOOD: Lipase: 25 U/L (ref 11–51)

## 2015-05-25 MED ORDER — SODIUM CHLORIDE 0.9 % IV BOLUS (SEPSIS)
1000.0000 mL | Freq: Once | INTRAVENOUS | Status: AC
  Administered 2015-05-25: 1000 mL via INTRAVENOUS

## 2015-05-25 MED ORDER — DIPHENHYDRAMINE HCL 50 MG/ML IJ SOLN
25.0000 mg | Freq: Once | INTRAMUSCULAR | Status: AC
  Administered 2015-05-25: 25 mg via INTRAVENOUS
  Filled 2015-05-25: qty 1

## 2015-05-25 MED ORDER — METOCLOPRAMIDE HCL 5 MG/ML IJ SOLN
10.0000 mg | Freq: Once | INTRAMUSCULAR | Status: AC
  Administered 2015-05-25: 10 mg via INTRAVENOUS
  Filled 2015-05-25: qty 2

## 2015-05-25 MED ORDER — SODIUM CHLORIDE 0.9 % IV SOLN: INTRAVENOUS | Status: DC

## 2015-05-25 MED ORDER — SUCRALFATE 1 GM/10ML PO SUSP: 1.0000 g | Freq: Three times a day (TID) | ORAL | Status: DC

## 2015-05-25 MED ORDER — PANTOPRAZOLE SODIUM 40 MG PO TBEC: 40.0000 mg | DELAYED_RELEASE_TABLET | Freq: Every day | ORAL | Status: DC

## 2015-05-25 MED ORDER — PROMETHAZINE HCL 25 MG PO TABS: 25.0000 mg | ORAL_TABLET | Freq: Four times a day (QID) | ORAL | Status: DC | PRN

## 2015-05-25 MED ORDER — FENTANYL CITRATE (PF) 100 MCG/2ML IJ SOLN
50.0000 ug | Freq: Once | INTRAMUSCULAR | Status: AC
  Administered 2015-05-25: 50 ug via INTRAVENOUS
  Filled 2015-05-25: qty 2

## 2015-05-25 NOTE — Discharge Instructions (Signed)
Please continue taking your Protonix for acid reflux as prescribed. We have added on Carafate to help with your acid reflux. Please take Phenergan as needed for nausea. Your labs showed signs of dehydration today. These improved with IV fluids. You should have your blood work rechecked by her primary care physician in one week.   Gastroesophageal Reflux Disease, Adult Normally, food travels down the esophagus and stays in the stomach to be digested. However, when a person has gastroesophageal reflux disease (GERD), food and stomach acid move back up into the esophagus. When this happens, the esophagus becomes sore and inflamed. Over time, GERD can create small holes (ulcers) in the lining of the esophagus.  CAUSES This condition is caused by a problem with the muscle between the esophagus and the stomach (lower esophageal sphincter, or LES). Normally, the LES muscle closes after food passes through the esophagus to the stomach. When the LES is weakened or abnormal, it does not close properly, and that allows food and stomach acid to go back up into the esophagus. The LES can be weakened by certain dietary substances, medicines, and medical conditions, including:  Tobacco use.  Pregnancy.  Having a hiatal hernia.  Heavy alcohol use.  Certain foods and beverages, such as coffee, chocolate, onions, and peppermint. RISK FACTORS This condition is more likely to develop in:  People who have an increased body weight.  People who have connective tissue disorders.  People who use NSAID medicines. SYMPTOMS Symptoms of this condition include:  Heartburn.  Difficult or painful swallowing.  The feeling of having a lump in the throat.  Abitter taste in the mouth.  Bad breath.  Having a large amount of saliva.  Having an upset or bloated stomach.  Belching.  Chest pain.  Shortness of breath or wheezing.  Ongoing (chronic) cough or a night-time cough.  Wearing away of tooth  enamel.  Weight loss. Different conditions can cause chest pain. Make sure to see your health care provider if you experience chest pain. DIAGNOSIS Your health care provider will take a medical history and perform a physical exam. To determine if you have mild or severe GERD, your health care provider may also monitor how you respond to treatment. You may also have other tests, including:  An endoscopy toexamine your stomach and esophagus with a small camera.  A test thatmeasures the acidity level in your esophagus.  A test thatmeasures how much pressure is on your esophagus.  A barium swallow or modified barium swallow to show the shape, size, and functioning of your esophagus. TREATMENT The goal of treatment is to help relieve your symptoms and to prevent complications. Treatment for this condition may vary depending on how severe your symptoms are. Your health care provider may recommend:  Changes to your diet.  Medicine.  Surgery. HOME CARE INSTRUCTIONS Diet  Follow a diet as recommended by your health care provider. This may involve avoiding foods and drinks such as:  Coffee and tea (with or without caffeine).  Drinks that containalcohol.  Energy drinks and sports drinks.  Carbonated drinks or sodas.  Chocolate and cocoa.  Peppermint and mint flavorings.  Garlic and onions.  Horseradish.  Spicy and acidic foods, including peppers, chili powder, curry powder, vinegar, hot sauces, and barbecue sauce.  Citrus fruit juices and citrus fruits, such as oranges, lemons, and limes.  Tomato-based foods, such as red sauce, chili, salsa, and pizza with red sauce.  Fried and fatty foods, such as donuts, french fries, potato chips,  and high-fat dressings.  High-fat meats, such as hot dogs and fatty cuts of red and white meats, such as rib eye steak, sausage, ham, and bacon.  High-fat dairy items, such as whole milk, butter, and cream cheese.  Eat small, frequent  meals instead of large meals.  Avoid drinking large amounts of liquid with your meals.  Avoid eating meals during the 2-3 hours before bedtime.  Avoid lying down right after you eat.  Do not exercise right after you eat. General Instructions  Pay attention to any changes in your symptoms.  Take over-the-counter and prescription medicines only as told by your health care provider. Do not take aspirin, ibuprofen, or other NSAIDs unless your health care provider told you to do so.  Do not use any tobacco products, including cigarettes, chewing tobacco, and e-cigarettes. If you need help quitting, ask your health care provider.  Wear loose-fitting clothing. Do not wear anything tight around your waist that causes pressure on your abdomen.  Raise (elevate) the head of your bed 6 inches (15cm).  Try to reduce your stress, such as with yoga or meditation. If you need help reducing stress, ask your health care provider.  If you are overweight, reduce your weight to an amount that is healthy for you. Ask your health care provider for guidance about a safe weight loss goal.  Keep all follow-up visits as told by your health care provider. This is important. SEEK MEDICAL CARE IF:  You have new symptoms.  You have unexplained weight loss.  You have difficulty swallowing, or it hurts to swallow.  You have wheezing or a persistent cough.  Your symptoms do not improve with treatment.  You have a hoarse voice. SEEK IMMEDIATE MEDICAL CARE IF:  You have pain in your arms, neck, jaw, teeth, or back.  You feel sweaty, dizzy, or light-headed.  You have chest pain or shortness of breath.  You vomit and your vomit looks like blood or coffee grounds.  You faint.  Your stool is bloody or black.  You cannot swallow, drink, or eat.   This information is not intended to replace advice given to you by your health care provider. Make sure you discuss any questions you have with your health  care provider.   Document Released: 10/07/2004 Document Revised: 09/18/2014 Document Reviewed: 04/24/2014 Elsevier Interactive Patient Education 2016 [place] for Gastroesophageal Reflux Disease, Adult When you have gastroesophageal reflux disease (GERD), the foods you eat and your eating habits are very important. Choosing the right foods can help ease the discomfort of GERD. WHAT GENERAL GUIDELINES DO I NEED TO FOLLOW?  Choose fruits, vegetables, whole grains, low-fat dairy products, and low-fat meat, fish, and poultry.  Limit fats such as oils, salad dressings, butter, nuts, and avocado.  Keep a food diary to identify foods that cause symptoms.  Avoid foods that cause reflux. These may be different for different people.  Eat frequent small meals instead of three large meals each day.  Eat your meals slowly, in a relaxed setting.  Limit fried foods.  Cook foods using methods other than frying.  Avoid drinking alcohol.  Avoid drinking large amounts of liquids with your meals.  Avoid bending over or lying down until 2-3 hours after eating. WHAT FOODS ARE NOT RECOMMENDED? The following are some foods and drinks that may worsen your symptoms: Vegetables Tomatoes. Tomato juice. Tomato and spaghetti sauce. Chili peppers. Onion and garlic. Horseradish. Fruits Oranges, grapefruit, and lemon (fruit and juice).  Meats High-fat meats, fish, and poultry. This includes hot dogs, ribs, ham, sausage, salami, and bacon. Dairy Whole milk and chocolate milk. Sour cream. Cream. Butter. Ice cream. Cream cheese.  Beverages Coffee and tea, with or without caffeine. Carbonated beverages or energy drinks. Condiments Hot sauce. Barbecue sauce.  Sweets/Desserts Chocolate and cocoa. Donuts. Peppermint and spearmint. Fats and Oils High-fat foods, including [place] fries and potato chips. Other Vinegar. Strong spices, such as black pepper, white pepper, red pepper, cayenne,  curry powder, cloves, ginger, and chili powder. The items listed above may not be a complete list of foods and beverages to avoid. Contact your dietitian for more information.   This information is not intended to replace advice given to you by your health care provider. Make sure you discuss any questions you have with your health care provider.   Document Released: 12/28/2004 Document Revised: 01/18/2014 Document Reviewed: 11/01/2012 Elsevier Interactive Patient Education 2016 [employer]  Dehydration, Adult Dehydration is a condition in which you do not have enough fluid or water in your body. It happens when you take in less fluid than you lose. Vital organs such as the kidneys, brain, and heart cannot function without a proper amount of fluids. Any loss of fluids from the body can cause dehydration.  Dehydration can range from mild to severe. This condition should be treated right away to help prevent it from becoming severe. CAUSES  This condition may be caused by:  Vomiting.  Diarrhea.  Excessive sweating, such as when exercising in hot or humid weather.  Not drinking enough fluid during strenuous exercise or during an illness.  Excessive urine output.  Fever.  Certain medicines. RISK FACTORS This condition is more likely to develop in:  People who are taking certain medicines that cause the body to lose excess fluid (diuretics).   People who have a chronic illness, such as diabetes, that may increase urination.  Older adults.   People who live at high altitudes.   People who participate in endurance sports.  SYMPTOMS  Mild Dehydration  Thirst.  Dry lips.  Slightly dry mouth.  Dry, warm skin. Moderate Dehydration  Very dry mouth.   Muscle cramps.   Dark urine and decreased urine production.   Decreased tear production.   Headache.   Light-headedness, especially when you stand up from a sitting position.  Severe Dehydration  Changes  in skin.   Cold and clammy skin.   Skin does not spring back quickly when lightly pinched and released.   Changes in body fluids.   Extreme thirst.   No tears.   Not able to sweat when body temperature is high, such as in hot weather.   Minimal urine production.   Changes in vital signs.   Rapid, weak pulse (more than 100 beats per minute when you are sitting still).   Rapid breathing.   Low blood pressure.   Other changes.   Sunken eyes.   Cold hands and feet.   Confusion.  Lethargy and difficulty being awakened.  Fainting (syncope).   Short-term weight loss.   Unconsciousness. DIAGNOSIS  This condition may be diagnosed based on your symptoms. You may also have tests to determine how severe your dehydration is. These tests may include:   Urine tests.   Blood tests.  TREATMENT  Treatment for this condition depends on the severity. Mild or moderate dehydration can often be treated at home. Treatment should be started right away. Do not wait until dehydration becomes severe. Severe dehydration  needs to be treated at the hospital. Treatment for Mild Dehydration  Drinking plenty of water to replace the fluid you have lost.   Replacing minerals in your blood (electrolytes) that you may have lost.  Treatment for Moderate Dehydration  Consuming oral rehydration solution (ORS). Treatment for Severe Dehydration  Receiving fluid through an IV tube.   Receiving electrolyte solution through a feeding tube that is passed through your nose and into your stomach (nasogastric tube or NG tube).  Correcting any abnormalities in electrolytes. HOME CARE INSTRUCTIONS   Drink enough fluid to keep your urine clear or pale yellow.   Drink water or fluid slowly by taking small sips. You can also try sucking on ice cubes.  Have food or beverages that contain electrolytes. Examples include bananas and sports drinks.  Take over-the-counter and  prescription medicines only as told by your health care provider.   Prepare ORS according to the manufacturer's instructions. Take sips of ORS every 5 minutes until your urine returns to normal.  If you have vomiting or diarrhea, continue to try to drink water, ORS, or both.   If you have diarrhea, avoid:   Beverages that contain caffeine.   Fruit juice.   Milk.   Carbonated soft drinks.  Do not take salt tablets. This can lead to the condition of having too much sodium in your body (hypernatremia).  SEEK MEDICAL CARE IF:  You cannot eat or drink without vomiting.  You have had moderate diarrhea during a period of more than 24 hours.  You have a fever. SEEK IMMEDIATE MEDICAL CARE IF:   You have extreme thirst.  You have severe diarrhea.  You have not urinated in 6-8 hours, or you have urinated only a small amount of very dark urine.  You have shriveled skin.  You are dizzy, confused, or both.   This information is not intended to replace advice given to you by your health care provider. Make sure you discuss any questions you have with your health care provider.   Document Released: 12/28/2004 Document Revised: 09/18/2014 Document Reviewed: 05/15/2014 Elsevier Interactive Patient Education 2016 [employer] Inc[employer]  Acute Kidney Injury Acute kidney injury is any condition in which there is sudden (acute) damage to the kidneys. Acute kidney injury was previously known as acute kidney failure or acute renal failure. The kidneys are two organs that lie on either side of the spine between the middle of the back and the front of the abdomen. The kidneys:  Remove wastes and extra water from the blood.   Produce important hormones. These help keep bones strong, regulate blood pressure, and help create red blood cells.   Balance the fluids and chemicals in the blood and tissues. A small amount of kidney damage may not cause problems, but a large amount of damage may make  it difficult or impossible for the kidneys to work the way they should. Acute kidney injury may develop into long-lasting (chronic) kidney disease. It may also develop into a life-threatening disease called end-stage kidney disease. Acute kidney injury can get worse very quickly, so it should be treated right away. Early treatment may prevent other kidney diseases from developing. CAUSES   A problem with blood flow to the kidneys. This may be caused by:   Blood loss.   Heart disease.   Severe burns.   Liver disease.  Direct damage to the kidneys. This may be caused by:  Some medicines.   A kidney infection.   Poisoning or consuming  toxic substances.   A surgical wound.   A blow to the kidney area.   A problem with urine flow. This may be caused by:   Cancer.   Kidney stones.   An enlarged prostate. SIGNS AND SYMPTOMS   Swelling (edema) of the legs, ankles, or feet.   Tiredness (lethargy).   Nausea or vomiting.   Confusion.   Problems with urination, such as:   Painful or burning feeling during urination.   Decreased urine production.   Frequent accidents in children who are potty trained.   Bloody urine.   Muscle twitches and cramps.   Shortness of breath.   Seizures.   Chest pain or pressure. Sometimes, no symptoms are present. DIAGNOSIS Acute kidney injury may be detected and diagnosed by tests, including blood, urine, imaging, or kidney biopsy tests.  TREATMENT Treatment of acute kidney injury varies depending on the cause and severity of the kidney damage. In mild cases, no treatment may be needed. The kidneys may heal on their own. If acute kidney injury is more severe, your health care provider will treat the cause of the kidney damage, help the kidneys heal, and prevent complications from occurring. Severe cases may require a procedure to remove toxic wastes from the body (dialysis) or surgery to repair kidney damage.  Surgery may involve:   Repair of a torn kidney.   Removal of an obstruction. HOME CARE INSTRUCTIONS  Follow your prescribed diet.  Take medicines only as directed by your health care provider.  Do not take any new medicines (prescription, over-the-counter, or nutritional supplements) unless approved by your health care provider. Many medicines can worsen your kidney damage or may need to have the dose adjusted.   Keep all follow-up visits as directed by your health care provider. This is important.  Observe your condition to make sure you are healing as expected. SEEK IMMEDIATE MEDICAL CARE IF:  You are feeling ill or have severe pain in the back or side.   Your symptoms return or you have new symptoms.  You have any symptoms of end-stage kidney disease. These include:   Persistent itchiness.   Loss of appetite.   Headaches.   Abnormally dark or light skin.  Numbness in the hands or feet.   Easy bruising.   Frequent hiccups.   Menstruation stops.   You have a fever.  You have increased urine production.  You have pain or bleeding when urinating. MAKE SURE YOU:   Understand these instructions.  Will watch your condition.  Will get help right away if you are not doing well or get worse.   This information is not intended to replace advice given to you by your health care provider. Make sure you discuss any questions you have with your health care provider.   Document Released: 07/13/2010 Document Revised: 01/18/2014 Document Reviewed: 08/27/2011 Elsevier Interactive Patient Education [employer].

## 2015-05-25 NOTE — ED Notes (Signed)
Report given to [facility] at [place].
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
## 2015-05-27 ENCOUNTER — Encounter: Payer: Self-pay | Admitting: Adult Health

## 2015-05-27 ENCOUNTER — Non-Acute Institutional Stay (SKILLED_NURSING_FACILITY): Payer: Medicaid Other | Admitting: Adult Health

## 2015-05-27 DIAGNOSIS — K5901 Slow transit constipation: Secondary | ICD-10-CM | POA: Diagnosis not present

## 2015-05-27 DIAGNOSIS — K219 Gastro-esophageal reflux disease without esophagitis: Secondary | ICD-10-CM

## 2015-05-27 DIAGNOSIS — K449 Diaphragmatic hernia without obstruction or gangrene: Secondary | ICD-10-CM | POA: Diagnosis not present
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
## 2015-05-29 ENCOUNTER — Encounter (HOSPITAL_COMMUNITY): Payer: Self-pay | Admitting: Emergency Medicine

## 2015-05-29 ENCOUNTER — Inpatient Hospital Stay (HOSPITAL_COMMUNITY)
Admission: EM | Admit: 2015-05-29 | Discharge: 2015-06-02 | DRG: 872 | Disposition: A | Discharge status: Skilled Nursing Facility | Payer: Medicaid Other | Attending: Internal Medicine | Admitting: Internal Medicine

## 2015-05-29 ENCOUNTER — Emergency Department (HOSPITAL_COMMUNITY): Payer: Medicaid Other

## 2015-05-29 DIAGNOSIS — R140 Abdominal distension (gaseous): Secondary | ICD-10-CM | POA: Diagnosis present

## 2015-05-29 DIAGNOSIS — I959 Hypotension, unspecified: Secondary | ICD-10-CM | POA: Diagnosis present

## 2015-05-29 DIAGNOSIS — M4802 Spinal stenosis, cervical region: Secondary | ICD-10-CM | POA: Diagnosis present

## 2015-05-29 DIAGNOSIS — F17210 Nicotine dependence, cigarettes, uncomplicated: Secondary | ICD-10-CM | POA: Diagnosis present

## 2015-05-29 DIAGNOSIS — J449 Chronic obstructive pulmonary disease, unspecified: Secondary | ICD-10-CM | POA: Diagnosis present

## 2015-05-29 DIAGNOSIS — K5939 Other megacolon: Secondary | ICD-10-CM | POA: Diagnosis present

## 2015-05-29 DIAGNOSIS — K5900 Constipation, unspecified: Secondary | ICD-10-CM

## 2015-05-29 DIAGNOSIS — S8261XD Displaced fracture of lateral malleolus of right fibula, subsequent encounter for closed fracture with routine healing: Secondary | ICD-10-CM | POA: Diagnosis not present

## 2015-05-29 DIAGNOSIS — Z79899 Other long term (current) drug therapy: Secondary | ICD-10-CM | POA: Diagnosis not present

## 2015-05-29 DIAGNOSIS — Z880 Allergy status to penicillin: Secondary | ICD-10-CM | POA: Diagnosis not present

## 2015-05-29 DIAGNOSIS — R109 Unspecified abdominal pain: Secondary | ICD-10-CM

## 2015-05-29 DIAGNOSIS — E872 Acidosis, unspecified: Secondary | ICD-10-CM | POA: Diagnosis present

## 2015-05-29 DIAGNOSIS — E875 Hyperkalemia: Secondary | ICD-10-CM | POA: Diagnosis present

## 2015-05-29 DIAGNOSIS — N179 Acute kidney failure, unspecified: Secondary | ICD-10-CM | POA: Diagnosis present

## 2015-05-29 DIAGNOSIS — I10 Essential (primary) hypertension: Secondary | ICD-10-CM | POA: Diagnosis present

## 2015-05-29 DIAGNOSIS — K219 Gastro-esophageal reflux disease without esophagitis: Secondary | ICD-10-CM | POA: Diagnosis present

## 2015-05-29 DIAGNOSIS — E782 Mixed hyperlipidemia: Secondary | ICD-10-CM | POA: Diagnosis present

## 2015-05-29 DIAGNOSIS — R6520 Severe sepsis without septic shock: Secondary | ICD-10-CM | POA: Diagnosis present

## 2015-05-29 DIAGNOSIS — E785 Hyperlipidemia, unspecified: Secondary | ICD-10-CM | POA: Diagnosis present

## 2015-05-29 DIAGNOSIS — A419 Sepsis, unspecified organism: Secondary | ICD-10-CM | POA: Diagnosis present

## 2015-05-29 DIAGNOSIS — N390 Urinary tract infection, site not specified: Secondary | ICD-10-CM | POA: Diagnosis present

## 2015-05-29 DIAGNOSIS — I129 Hypertensive chronic kidney disease with stage 1 through stage 4 chronic kidney disease, or unspecified chronic kidney disease: Secondary | ICD-10-CM | POA: Diagnosis present

## 2015-05-29 DIAGNOSIS — Z981 Arthrodesis status: Secondary | ICD-10-CM | POA: Diagnosis not present

## 2015-05-29 DIAGNOSIS — M25511 Pain in right shoulder: Secondary | ICD-10-CM | POA: Diagnosis present

## 2015-05-29 DIAGNOSIS — Y92129 Unspecified place in nursing home as the place of occurrence of the external cause: Secondary | ICD-10-CM

## 2015-05-29 DIAGNOSIS — M069 Rheumatoid arthritis, unspecified: Secondary | ICD-10-CM | POA: Diagnosis present

## 2015-05-29 DIAGNOSIS — J410 Simple chronic bronchitis: Secondary | ICD-10-CM

## 2015-05-29 DIAGNOSIS — D72829 Elevated white blood cell count, unspecified: Secondary | ICD-10-CM | POA: Diagnosis present

## 2015-05-29 DIAGNOSIS — R52 Pain, unspecified: Secondary | ICD-10-CM

## 2015-05-29 DIAGNOSIS — N183 Chronic kidney disease, stage 3 (moderate): Secondary | ICD-10-CM | POA: Diagnosis present

## 2015-05-29 LAB — COMPREHENSIVE METABOLIC PANEL
ALBUMIN: 2.4 g/dL — AB (ref 3.5–5.0)
ALK PHOS: 89 U/L (ref 38–126)
ALT: UNDETERMINED U/L (ref 14–54)
ANION GAP: 17 — AB (ref 5–15)
AST: 23 U/L (ref 15–41)
BILIRUBIN TOTAL: UNDETERMINED mg/dL (ref 0.3–1.2)
BUN: 93 mg/dL — AB (ref 6–20)
CALCIUM: 8.2 mg/dL — AB (ref 8.9–10.3)
CO2: 15 mmol/L — ABNORMAL LOW (ref 22–32)
Chloride: 101 mmol/L (ref 101–111)
Creatinine, Ser: 4.03 mg/dL — ABNORMAL HIGH (ref 0.44–1.00)
GFR calc Af Amer: 13 mL/min — ABNORMAL LOW (ref >60)
GFR calc non Af Amer: 11 mL/min — ABNORMAL LOW (ref >60)
GLUCOSE: 84 mg/dL (ref 65–99)
Potassium: 5.8 mmol/L — ABNORMAL HIGH (ref 3.5–5.1)
SODIUM: 133 mmol/L — AB (ref 135–145)
Total Protein: 6.1 g/dL — ABNORMAL LOW (ref 6.5–8.1)

## 2015-05-29 LAB — RAPID URINE DRUG SCREEN, HOSP PERFORMED
AMPHETAMINES: NOT DETECTED
BARBITURATES: NOT DETECTED
Benzodiazepines: NOT DETECTED
Cocaine: NOT DETECTED
Opiates: NOT DETECTED
TETRAHYDROCANNABINOL: NOT DETECTED

## 2015-05-29 LAB — CBC WITH DIFFERENTIAL/PLATELET
BASOS ABS: 0 10*3/uL (ref 0.0–0.1)
BASOS PCT: 0 %
EOS ABS: 0 10*3/uL (ref 0.0–0.7)
Eosinophils Relative: 0 %
HEMATOCRIT: 40.6 % (ref 36.0–46.0)
HEMOGLOBIN: 14 g/dL (ref 12.0–15.0)
LYMPHS PCT: 4 %
Lymphs Abs: 1.5 10*3/uL (ref 0.7–4.0)
MCH: 28.9 pg (ref 26.0–34.0)
MCHC: 34.5 g/dL (ref 30.0–36.0)
MCV: 83.7 fL (ref 78.0–100.0)
MONOS PCT: 4 %
Monocytes Absolute: 1.5 10*3/uL — ABNORMAL HIGH (ref 0.1–1.0)
NEUTROS ABS: 33.6 10*3/uL — AB (ref 1.7–7.7)
NEUTROS PCT: 92 %
Platelets: 198 10*3/uL (ref 150–400)
RBC: 4.85 MIL/uL (ref 3.87–5.11)
RDW: 14.7 % (ref 11.5–15.5)
WBC: 36.6 10*3/uL — ABNORMAL HIGH (ref 4.0–10.5)

## 2015-05-29 LAB — URINALYSIS, ROUTINE W REFLEX MICROSCOPIC
Glucose, UA: NEGATIVE mg/dL
Ketones, ur: 15 mg/dL — AB
NITRITE: NEGATIVE
PH: 5 (ref 5.0–8.0)
Protein, ur: 30 mg/dL — AB
SPECIFIC GRAVITY, URINE: 1.02 (ref 1.005–1.030)

## 2015-05-29 LAB — LIPASE, BLOOD: Lipase: 20 U/L (ref 11–51)

## 2015-05-29 LAB — URINE MICROSCOPIC-ADD ON

## 2015-05-29 LAB — I-STAT CG4 LACTIC ACID, ED
LACTIC ACID, VENOUS: 2.44 mmol/L — AB (ref 0.5–2.0)
Lactic Acid, Venous: 2.17 mmol/L (ref 0.5–2.0)

## 2015-05-29 MED ORDER — VANCOMYCIN HCL IN DEXTROSE 1-5 GM/200ML-% IV SOLN
1000.0000 mg | Freq: Once | INTRAVENOUS | Status: AC
  Administered 2015-05-29: 1000 mg via INTRAVENOUS
  Filled 2015-05-29: qty 200

## 2015-05-29 MED ORDER — AZTREONAM 1 G IJ SOLR: 1.0000 g | Freq: Three times a day (TID) | INTRAMUSCULAR | Status: DC

## 2015-05-29 MED ORDER — POLYETHYLENE GLYCOL 3350 17 G PO PACK
17.0000 g | PACK | Freq: Every day | ORAL | Status: DC
  Administered 2015-05-30 – 2015-06-01 (×4): 17 g via ORAL
  Filled 2015-05-29 (×4): qty 1

## 2015-05-29 MED ORDER — DEXTROSE 5 % IV SOLN
2.0000 g | Freq: Once | INTRAVENOUS | Status: AC
  Administered 2015-05-29: 2 g via INTRAVENOUS
  Filled 2015-05-29: qty 2

## 2015-05-29 MED ORDER — METOCLOPRAMIDE HCL 5 MG PO TABS
2.5000 mg | ORAL_TABLET | Freq: Three times a day (TID) | ORAL | Status: DC
  Administered 2015-05-30 – 2015-06-02 (×14): 2.5 mg via ORAL
  Filled 2015-05-29 (×3): qty 1
  Filled 2015-05-29 (×4): qty 0.5
  Filled 2015-05-29: qty 1
  Filled 2015-05-29 (×6): qty 0.5
  Filled 2015-05-29: qty 1
  Filled 2015-05-29: qty 0.5

## 2015-05-29 MED ORDER — BECLOMETHASONE DIPROPIONATE 80 MCG/ACT IN AERS: 2.0000 | INHALATION_SPRAY | Freq: Two times a day (BID) | RESPIRATORY_TRACT | Status: DC

## 2015-05-29 MED ORDER — ACETAMINOPHEN 650 MG RE SUPP: 650.0000 mg | Freq: Four times a day (QID) | RECTAL | Status: DC | PRN

## 2015-05-29 MED ORDER — SODIUM POLYSTYRENE SULFONATE 15 GM/60ML PO SUSP
15.0000 g | Freq: Once | ORAL | Status: AC
  Administered 2015-05-30: 15 g via ORAL
  Filled 2015-05-29: qty 60

## 2015-05-29 MED ORDER — LEVOFLOXACIN IN D5W 750 MG/150ML IV SOLN: 750.0000 mg | Freq: Once | INTRAVENOUS | Status: DC

## 2015-05-29 MED ORDER — ALBUTEROL SULFATE HFA 108 (90 BASE) MCG/ACT IN AERS: 2.0000 | INHALATION_SPRAY | Freq: Four times a day (QID) | RESPIRATORY_TRACT | Status: DC | PRN

## 2015-05-29 MED ORDER — ACETAMINOPHEN 325 MG PO TABS
650.0000 mg | ORAL_TABLET | Freq: Four times a day (QID) | ORAL | Status: DC | PRN
Start: 2015-05-29 — End: 2015-06-02
  Administered 2015-05-30 – 2015-06-02 (×4): 650 mg via ORAL
  Filled 2015-05-29 (×4): qty 2

## 2015-05-29 MED ORDER — BUDESONIDE 0.25 MG/2ML IN SUSP
0.2500 mg | Freq: Two times a day (BID) | RESPIRATORY_TRACT | Status: DC
  Administered 2015-05-29 – 2015-06-02 (×8): 0.25 mg via RESPIRATORY_TRACT
  Filled 2015-05-29 (×8): qty 2

## 2015-05-29 MED ORDER — PROCHLORPERAZINE EDISYLATE 5 MG/ML IJ SOLN
10.0000 mg | Freq: Four times a day (QID) | INTRAMUSCULAR | Status: DC | PRN
  Administered 2015-05-29: 10 mg via INTRAVENOUS
  Filled 2015-05-29 (×2): qty 2

## 2015-05-29 MED ORDER — PANTOPRAZOLE SODIUM 40 MG PO TBEC
40.0000 mg | DELAYED_RELEASE_TABLET | Freq: Every day | ORAL | Status: DC
  Administered 2015-05-30 – 2015-06-02 (×4): 40 mg via ORAL
  Filled 2015-05-29 (×4): qty 1

## 2015-05-29 MED ORDER — VANCOMYCIN HCL IN DEXTROSE 1-5 GM/200ML-% IV SOLN
1000.0000 mg | INTRAVENOUS | Status: AC
  Administered 2015-05-29: 1000 mg via INTRAVENOUS
  Filled 2015-05-29: qty 200

## 2015-05-29 MED ORDER — SODIUM CHLORIDE 0.9 % IV BOLUS (SEPSIS)
1000.0000 mL | Freq: Once | INTRAVENOUS | Status: AC
  Administered 2015-05-29: 1000 mL via INTRAVENOUS

## 2015-05-29 MED ORDER — ALBUTEROL SULFATE (2.5 MG/3ML) 0.083% IN NEBU: 2.5000 mg | INHALATION_SOLUTION | Freq: Four times a day (QID) | RESPIRATORY_TRACT | Status: DC | PRN

## 2015-05-29 MED ORDER — VANCOMYCIN HCL IN DEXTROSE 1-5 GM/200ML-% IV SOLN
1000.0000 mg | Freq: Once | INTRAVENOUS | Status: AC
  Administered 2015-05-30: 1000 mg via INTRAVENOUS
  Filled 2015-05-29: qty 200

## 2015-05-29 MED ORDER — SODIUM CHLORIDE 0.9 % IV SOLN
INTRAVENOUS | Status: DC
  Administered 2015-05-29 – 2015-06-01 (×4): via INTRAVENOUS

## 2015-05-29 MED ORDER — DEXTROSE 5 % IV SOLN: 2.0000 g | Freq: Once | INTRAVENOUS | Status: DC

## 2015-05-29 MED ORDER — SODIUM CHLORIDE 0.9 % IV BOLUS (SEPSIS): 500.0000 mL | Freq: Once | INTRAVENOUS | Status: DC

## 2015-05-29 MED ORDER — ATORVASTATIN CALCIUM 40 MG PO TABS
40.0000 mg | ORAL_TABLET | Freq: Every day | ORAL | Status: DC
  Administered 2015-05-30 – 2015-06-01 (×3): 40 mg via ORAL
  Filled 2015-05-29 (×3): qty 1

## 2015-05-29 MED ORDER — DEXTROSE 5 % IV SOLN
1.0000 g | Freq: Three times a day (TID) | INTRAVENOUS | Status: DC
  Administered 2015-05-30: 1 g via INTRAVENOUS
  Filled 2015-05-29 (×3): qty 1

## 2015-05-29 NOTE — ED Notes (Signed)
Pupils 2, non reactive to light.
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
## 2015-05-29 NOTE — ED Notes (Signed)
Report attempted 

## 2015-05-29 NOTE — ED Notes (Signed)
In Nursing home, got out of bed this morning and fell.  C/O left shoulder pain.  Also c/o right abdominal pain; was seen for same 4-5 days ago.

## 2015-05-29 NOTE — Progress Notes (Signed)
Pharmacy Antibiotic Note  [name] is a 60 y.o. female admitted on 05/29/2015 with sepsis.  Pharmacy has been consulted for vancomycin/aztreonam dosing.  PMH COPD, essential hypertension, chronic kidney disease stage III, cervical spine stenosis  WBC elevated to 36, however has been on steroids prior to admission for cervical spine stenosis. Cr elevated to 4.03, CrCl ~ 15 ml/min (baseline around 1.3-1.4). Lactate of 2.44, afebrile. Received vancomycin 1g x 1 and cefepime 2 g x 1 in ED. Given wt of 86.2 kg, will give additional vancomycin to complete loading dose of 2 g.  Plan: Vancomycin 1 g IV x 1 to complete load Aztreonam 1 g IV q8h starting on 5/19  F/u renal function for additional doses of vancomycin VR as indicated with any changes in renal function F/u cx results, clinical status  Weight: 190 lb (86.183 kg)  Temp (24hrs), Avg:97.8 F (36.6 C), Min:97.8 F (36.6 C), Max:97.8 F (36.6 C)   Recent Labs Lab 05/24/15 2315 05/25/15 0235 05/29/15 1048 05/29/15 1152 05/29/15 1437  WBC 5.3  --  36.6*  --   --   CREATININE 1.49* 1.36* 4.03*  --   --   LATICACIDVEN  --   --   --  2.17* 2.44*    Estimated Creatinine Clearance: 16.4 mL/min (by C-G formula based on Cr of 4.03).    Allergies  Allergen Reactions  . Iohexol Anaphylaxis, Swelling and Other (See Comments)      Throat swelling    . Levofloxacin In D5w Other (See Comments)    Has baseline prolonged QTc.  Avoid QTc prolonging medications.  Tresa Moore Hcl] Other (See Comments)    Has baseline prolonged QTc.  Avoid QTc prolonging medications.  . 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 Penicillins Itching    Has patient had a PCN reaction causing immediate rash, facial/tongue/throat swelling, SOB or lightheadedness with hypotension:  yes Has patient had a PCN reaction causing severe rash involving mucus membranes or skin necrosis: unknown Has patient had a PCN reaction that required hospitalization : no, reacted while in the Drs office Has patient had a PCN reaction occurring within the last 10 years: yes If all of the above answers are "NO", then may proceed with Cephalosporin use.     Antimicrobials this admission: 5/18 vancomycin >>  5/18 cefepime >> x1 5/19 aztreonam >>  Dose adjustments this admission: N/a  Microbiology results: 5/18 BCx:  5/18 UCx:   Thank you for allowing pharmacy to be a part of this patient's care.  Wynelle Fanny 05/29/2015 4:56 PM
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
## 2015-05-29 NOTE — Progress Notes (Addendum)
Patient is from [facility]. Patient currently somnolent and unable to assess. CSW to be followed by unit CSW for disposition and transfer back to [facility] when medically stable and cleared for discharge.      Emiliano Dyer, LCSW [facility] ED/70M Clinical Social Worker [phone]

## 2015-05-29 NOTE — ED Notes (Signed)
Patient transported to CT 

## 2015-05-29 NOTE — ED Notes (Signed)
Pt awakens to voice.  When I asked her what was wrong she said "nothing, I'm just tired; let me sleep."

## 2015-05-30 ENCOUNTER — Inpatient Hospital Stay (HOSPITAL_COMMUNITY): Payer: Medicaid Other

## 2015-05-30 LAB — CBC
HEMATOCRIT: 38.1 % (ref 36.0–46.0)
HEMOGLOBIN: 12.6 g/dL (ref 12.0–15.0)
MCH: 28.3 pg (ref 26.0–34.0)
MCHC: 33.1 g/dL (ref 30.0–36.0)
MCV: 85.6 fL (ref 78.0–100.0)
Platelets: 175 10*3/uL (ref 150–400)
RBC: 4.45 MIL/uL (ref 3.87–5.11)
RDW: 14.8 % (ref 11.5–15.5)
WBC: 25.7 10*3/uL — AB (ref 4.0–10.5)

## 2015-05-30 LAB — COMPREHENSIVE METABOLIC PANEL
ALBUMIN: 1.9 g/dL — AB (ref 3.5–5.0)
ALT: 20 U/L (ref 14–54)
ALT: 18 U/L (ref 14–54)
ANION GAP: 15 (ref 5–15)
AST: 24 U/L (ref 15–41)
AST: 18 U/L (ref 15–41)
Albumin: 2.2 g/dL — ABNORMAL LOW (ref 3.5–5.0)
Alkaline Phosphatase: 100 U/L (ref 38–126)
Alkaline Phosphatase: 89 U/L (ref 38–126)
Anion gap: 11 (ref 5–15)
BILIRUBIN TOTAL: 1 mg/dL (ref 0.3–1.2)
BILIRUBIN TOTAL: 0.9 mg/dL (ref 0.3–1.2)
BUN: 71 mg/dL — AB (ref 6–20)
BUN: 65 mg/dL — AB (ref 6–20)
CO2: 16 mmol/L — ABNORMAL LOW (ref 22–32)
CO2: 17 mmol/L — AB (ref 22–32)
Calcium: 8.3 mg/dL — ABNORMAL LOW (ref 8.9–10.3)
Calcium: 8.1 mg/dL — ABNORMAL LOW (ref 8.9–10.3)
Chloride: 105 mmol/L (ref 101–111)
Chloride: 109 mmol/L (ref 101–111)
Creatinine, Ser: 2.48 mg/dL — ABNORMAL HIGH (ref 0.44–1.00)
Creatinine, Ser: 2.22 mg/dL — ABNORMAL HIGH (ref 0.44–1.00)
GFR calc Af Amer: 23 mL/min — ABNORMAL LOW (ref >60)
GFR calc Af Amer: 26 mL/min — ABNORMAL LOW (ref >60)
GFR calc non Af Amer: 23 mL/min — ABNORMAL LOW (ref >60)
GFR, EST NON AFRICAN AMERICAN: 20 mL/min — AB (ref >60)
GLUCOSE: 108 mg/dL — AB (ref 65–99)
Glucose, Bld: 70 mg/dL (ref 65–99)
POTASSIUM: 5.4 mmol/L — AB (ref 3.5–5.1)
POTASSIUM: 5 mmol/L (ref 3.5–5.1)
Sodium: 136 mmol/L (ref 135–145)
Sodium: 137 mmol/L (ref 135–145)
TOTAL PROTEIN: 5.9 g/dL — AB (ref 6.5–8.1)
TOTAL PROTEIN: 5.2 g/dL — AB (ref 6.5–8.1)

## 2015-05-30 LAB — VANCOMYCIN, RANDOM: VANCOMYCIN RM: 31 ug/mL

## 2015-05-30 LAB — POTASSIUM
POTASSIUM: 4.5 mmol/L (ref 3.5–5.1)
Potassium: 5 mmol/L (ref 3.5–5.1)
Potassium: 5.2 mmol/L — ABNORMAL HIGH (ref 3.5–5.1)
Potassium: 4.5 mmol/L (ref 3.5–5.1)

## 2015-05-30 LAB — PROTIME-INR
INR: 1.55 — ABNORMAL HIGH (ref 0.00–1.49)
PROTHROMBIN TIME: 18.6 s — AB (ref 11.6–15.2)

## 2015-05-30 LAB — LACTIC ACID, PLASMA
LACTIC ACID, VENOUS: 2.8 mmol/L — AB (ref 0.5–2.0)
LACTIC ACID, VENOUS: 1.9 mmol/L (ref 0.5–2.0)
Lactic Acid, Venous: 2.6 mmol/L (ref 0.5–2.0)

## 2015-05-30 LAB — PROCALCITONIN: PROCALCITONIN: 19.13 ng/mL

## 2015-05-30 LAB — MRSA PCR SCREENING: MRSA BY PCR: NEGATIVE

## 2015-05-30 LAB — APTT: aPTT: 38 seconds — ABNORMAL HIGH (ref 24–37)

## 2015-05-30 MED ORDER — CEFTAZIDIME 2 G IJ SOLR
2.0000 g | INTRAMUSCULAR | Status: DC
  Administered 2015-05-30: 2 g via INTRAVENOUS
  Filled 2015-05-30 (×2): qty 2

## 2015-05-30 MED ORDER — SODIUM CHLORIDE 0.9 % IV BOLUS (SEPSIS)
250.0000 mL | Freq: Once | INTRAVENOUS | Status: AC
  Administered 2015-05-30: 250 mL via INTRAVENOUS

## 2015-05-30 MED ORDER — MAGIC MOUTHWASH
5.0000 mL | Freq: Three times a day (TID) | ORAL | Status: DC | PRN
  Administered 2015-05-31 – 2015-06-01 (×2): 5 mL via ORAL
  Filled 2015-05-30 (×3): qty 5

## 2015-05-30 MED ORDER — HYDROCORTISONE NA SUCCINATE PF 100 MG IJ SOLR
50.0000 mg | Freq: Every day | INTRAMUSCULAR | Status: DC
  Administered 2015-05-30 – 2015-06-01 (×3): 50 mg via INTRAVENOUS
  Filled 2015-05-30 (×3): qty 2

## 2015-05-30 NOTE — [place] FL2 (Signed)
[place] MEDICAID FL2 LEVEL OF CARE SCREENING TOOL     IDENTIFICATION  Patient Name: [name] Birthdate: 1955-03-03 Sex: female Admission Date (Current Location): 05/29/2015  [facility] [place] and [place] Number:  Herbalist and Address:  The . [facility] [place] [address], [place], [place] [address]      Provider Number: [id number]  Attending Physician Name and Address:  Cristal Ford, DO  Relative Name and Phone Number:  Aalanah Dexheimer, daughter, [phone]    Current Level of Care: Hospital Recommended Level of Care: [place] Prior Approval Number:    Date Approved/Denied:   PASRR Number: [id number]  Discharge Plan: SNF    Current Diagnoses: Patient Active Problem List   Diagnosis Date Noted  . Severe sepsis ([place]) 05/29/2015  . Spinal stenosis in cervical region 05/12/2015  . S/P cervical spinal fusion 04/17/2015  . Acute cystitis without hematuria   . Vomiting   . Constipation 02/17/2015  . CAP (community acquired pneumonia) 02/15/2015  . Encephalopathy, metabolic [id number]  . Cocaine abuse with cocaine-induced disorder ([place]) 02/14/2015  . Sepsis ([place]) 10/28/2014  . SIRS (systemic inflammatory response syndrome) ([place]) 10/28/2014  . Sore throat 10/28/2014  . Acute encephalopathy 10/28/2014  . Metabolic acidosis   . Leukocytosis   . Hypotension 09/10/2014  . Dehydration 09/10/2014  . Chest pain at rest 09/09/2014  . Tobacco abuse 09/09/2014  . HLD (hyperlipidemia) 09/09/2014  . COPD (chronic obstructive pulmonary disease) ([place]) 09/09/2014  . GERD (gastroesophageal reflux disease) 09/09/2014  . Chest pain 08/20/2014  . Nausea & vomiting 08/20/2014  . CKD (chronic kidney disease) stage 3, GFR 30-59 ml/min 08/20/2014  . UTI (lower urinary tract infection) 08/20/2014  . Pain in the chest   . Pain 02/11/2013  . Cocaine abuse 02/11/2013  . Rheumatoid arthritis flare ([place]) 02/11/2013  . Acute renal failure ([place])  02/11/2013  . AKI (acute kidney injury) ([place]) 02/11/2013  . Hiatal hernia with gastroesophageal reflux 10/11/2012  . Abdominal mass 10/08/2012  . Knee pain 10/08/2012  . HTN (hypertension), benign 10/08/2012  . Pancreatic mass 10/07/2012  . Abdominal pain 10/07/2012  . Rheumatoid arthritis ([place])     Orientation RESPIRATION BLADDER Height & Weight     Self, Time, Situation, Place  Normal Continent Weight: 90.5 kg (199 lb 8.3 oz) Height:  5\' 6"  (167.6 cm)  BEHAVIORAL SYMPTOMS/MOOD NEUROLOGICAL BOWEL NUTRITION STATUS      Continent Diet (Please see DC summary)  AMBULATORY STATUS COMMUNICATION OF NEEDS Skin   Extensive Assist Verbally Surgical wounds (Closed incision on neck)                       Personal Care Assistance Level of Assistance  Bathing, Feeding, Dressing Bathing Assistance: Limited assistance Feeding assistance: Independent Dressing Assistance: Limited assistance     Functional Limitations Info             SPECIAL CARE FACTORS FREQUENCY  PT (By licensed PT)     PT Frequency: not yet assessed              Contractures      Additional Factors Info  Code Status, Allergies, Isolation Precautions Code Status Info: Full Allergies Info: Iohexol, Levofloxacin In D5w, Zofran, Pork-derived Products, Heparin, Penicillins     Isolation Precautions Info: MRSA     Current Medications (05/30/2015):  This is the current hospital active medication list Current Facility-Administered Medications  Medication Dose Route Frequency Provider Last Rate Last Dose  .  0.9 %  sodium chloride infusion   Intravenous Continuous Maryann Mikhail, DO 125 mL/hr at 05/30/15 1250    . acetaminophen (TYLENOL) tablet 650 mg  650 mg Oral Q6H PRN Maryann Mikhail, DO   650 mg at 05/30/15 1321   Or  . acetaminophen (TYLENOL) suppository 650 mg  650 mg Rectal Q6H PRN Maryann Mikhail, DO      . albuterol (PROVENTIL) (2.5 MG/3ML) 0.083% nebulizer solution 2.5 mg  2.5 mg Nebulization  Q6H PRN Maryann Mikhail, DO      . atorvastatin (LIPITOR) tablet 40 mg  40 mg Oral q1800 Maryann Mikhail, DO   40 mg at 05/29/15 2200  . budesonide (PULMICORT) nebulizer solution 0.25 mg  0.25 mg Nebulization BID Maryann Mikhail, DO   0.25 mg at 05/30/15 0831  . cefTAZidime (FORTAZ) 2 g in dextrose 5 % 50 mL IVPB  2 g Intravenous Q24H Maryann Mikhail, DO   2 g at 05/30/15 1416  . hydrocortisone sodium succinate (SOLU-CORTEF) 100 MG injection 50 mg  50 mg Intravenous Daily Maryann Mikhail, DO   50 mg at 05/30/15 1009  . metoCLOPramide (REGLAN) tablet 2.5 mg  2.5 mg Oral TID AC & HS Maryann Mikhail, DO   2.5 mg at 05/30/15 1250  . pantoprazole (PROTONIX) EC tablet 40 mg  40 mg Oral Daily Maryann Mikhail, DO   40 mg at 05/30/15 1009  . polyethylene glycol (MIRALAX / GLYCOLAX) packet 17 g  17 g Oral QHS Maryann Mikhail, DO   17 g at 05/30/15 0020  . prochlorperazine (COMPAZINE) injection 10 mg  10 mg Intravenous Q6H PRN Dianne Dun, NP   10 mg at 05/29/15 2323     Discharge Medications: Please see discharge summary for a list of discharge medications.  Relevant Imaging Results:  Relevant Lab Results:   Additional Information SS#: [id number]  Benard Halsted, LCSWA

## 2015-05-30 NOTE — Progress Notes (Addendum)
PROGRESS NOTE    [name]  ZES:[id number] DOB: 1956-01-05 DOA: 05/29/2015 PCP: Philis Fendt, MD   Brief Narrative:  On 05/29/2015 [name] is a 60 y.o. female with a medical history of COPD, essential hypertension, chronic kidney disease, cervical spine stenosis, but present to the emergency department after falling at the nursing facility if this today and complaining of right shoulder pain. Patient was given narcotic medications prior to arrival. She is currently somnolent, but arousable. Information in this history and physical were obtained however from EDP and prior records. Currently patient denies any chest pain, shortness of breath, headache, dizziness, back or neck pain. She does complain of abdominal pain. She was recently seen in emergency department a few days ago for nausea and vomiting. Patient does have a short leg cast on her right lower extremity however cannot tell me when this was placed. She cannot tell me he was been following her for this. [facility] called for admission.  Assessment & Plan   Severe sepsis likely secondary to UTI -Upon admission, patient leukocytosis of 36.6, lactic acid 2.44, hypotensive, acute kidney injury -Lactic acid and WBC improving -UA shows the PVCs 6-30, many bacteria, moderate leukocytes, calcium oxalate crystals -Urine culture >100K GNR -Blood cultures pending  -CT abdomen and pelvis showed relative dilatation of the colon to the level of the splenic flexure where there is an abrupt contour change. No CT in this area, potentially could indicate focal scarring or residual of old ischemic episode -Was placed on vanc and aztreonam (given PCN allergy and MRSA) -Will deescalate to vanc and cefepime  Acute on chronic kidney injury, stage III/ metabolic acidosis -Baseline creatinine approximately 1.2-1.4, was 4.03 upon admission -Likely secondary to sepsis -Will hold nephrotoxic agents (ACE inhibitor) -Continue on IV fluids and continue to  monitor BMP -Creatinine 2.22 today  Hyperkalemia -Resolved, Likely secondary to acute kidney injury -Continue to monitor  Abdominal pain -Question whether this is related to CT findings as mentioned above versus constipation -Will place patient on bowel regimen and continue to monitor -AST and lipase, alk phosphatase within normal limits (ALT and bilirubin not calculated)  Leukocytosis -Improving, Most likely secondary to sepsis however patient was on Decadron -Will continue to monitor CBC along with the above treatment and plan  Somnolence -Resolved, Likely secondary to narcotics -Currently patient has no neurological deficits -CT head: Normal CT appearance of the brain, no acute fractures  COPD -Currently stable, no active wheezing -Continue home medications  Essential hypertension -Patient is currently hypotensive, will place on IV fluids -Hold amlodipine, lisinopril -Placed on solucortef for possible AI  Hyperlipidemia -Continue statin  Cervical stenosis -Status post ACDF with Dr. Kathyrn Sheriff, neurosurgery -Patient was recently admitted and discharged on 05/16/2015 -Spoke with Dr. Kathyrn Sheriff via phone, can taper decadron  Right shoulder pain -Able to move RUE with ease -Will obtain xray  Right lateral malleolus nondisplaced ankle fracture -Patient does have short leg cast in place, done on 05/16/2015 -Patient is to follow with Dr. Lorin Mercy in 2-3 weeks. -Attempted to reach Dr. Lorin Mercy.   DVT Prophylaxis  SCDs  Code Status: Full  Family Communication: None at bedside  Disposition Plan: Admitted. Continue to monitor in step down to due soft BP  Consultants Dr. Kathyrn Sheriff, neurosurgery, via phone  Procedures  None  Antibiotics   Anti-infectives    Start     Dose/Rate Route Frequency Ordered Stop   05/30/15 1400  aztreonam (AZACTAM) injection 1 g  Status:  Discontinued     1  g Intramuscular Every 8 hours 05/29/15 1746 05/29/15 2126   05/30/15 1400  cefTAZidime  (FORTAZ) 2 g in dextrose 5 % 50 mL IVPB     2 g 100 mL/hr over 30 Minutes Intravenous Every 24 hours 05/30/15 0859     05/30/15 0600  aztreonam (AZACTAM) 1 g in dextrose 5 % 50 mL IVPB  Status:  Discontinued     1 g 100 mL/hr over 30 Minutes Intravenous Every 8 hours 05/29/15 2127 05/30/15 0859   05/29/15 2130  aztreonam (AZACTAM) 2 g in dextrose 5 % 50 mL IVPB     2 g 100 mL/hr over 30 Minutes Intravenous  Once 05/29/15 2120 05/29/15 2255   05/29/15 2130  vancomycin (VANCOCIN) IVPB 1000 mg/200 mL premix     1,000 mg 200 mL/hr over 60 Minutes Intravenous  Once 05/29/15 2120 05/30/15 0130   05/29/15 1800  vancomycin (VANCOCIN) IVPB 1000 mg/200 mL premix     1,000 mg 200 mL/hr over 60 Minutes Intravenous STAT 05/29/15 1746 05/29/15 2011   05/29/15 1415  ceFEPIme (MAXIPIME) 2 g in dextrose 5 % 50 mL IVPB     2 g 100 mL/hr over 30 Minutes Intravenous  Once 05/29/15 1410 05/29/15 1443   05/29/15 1245  levofloxacin (LEVAQUIN) IVPB 750 mg  Status:  Discontinued     750 mg 100 mL/hr over 90 Minutes Intravenous  Once 05/29/15 1230 05/29/15 1410   05/29/15 1245  aztreonam (AZACTAM) 2 g in dextrose 5 % 50 mL IVPB  Status:  Discontinued     2 g 100 mL/hr over 30 Minutes Intravenous  Once 05/29/15 1230 05/29/15 1410   05/29/15 1245  vancomycin (VANCOCIN) IVPB 1000 mg/200 mL premix     1,000 mg 200 mL/hr over 60 Minutes Intravenous  Once 05/29/15 1230 05/29/15 1622      Subjective:   [name] seen and examined today.  Continues to complain of abdominal pain, but states it is better than yesterday.  Was able to have a bowel movement. Denies chest pain, shortness of breath, nausea or vomiting, dizziness, headache.   Objective:   Filed Vitals:   05/30/15 0500 05/30/15 0813 05/30/15 0831 05/30/15 1100  BP:  80/41  101/56  Pulse:  25  25  Temp:  97.6 F (36.4 C)  97.8 F (36.6 C)  TempSrc:  Oral  Axillary  Resp:  28  17  Height:      Weight: 90.5 kg (199 lb 8.3 oz)     SpO2:  99%  98% 91%    Intake/Output Summary (Last 24 hours) at 05/30/15 1241 Last data filed at 05/30/15 0502  Gross per 24 hour  Intake 1289.58 ml  Output   1075 ml  Net 214.58 ml   Filed Weights   05/29/15 0931 05/29/15 2104 05/30/15 0500  Weight: 86.183 kg (190 lb) 88.7 kg (195 lb 8.8 oz) 90.5 kg (199 lb 8.3 oz)    Exam  General: Well developed, well nourished, NAD  HEENT: NCAT,mucous membranes moist.   Cardiovascular: S1 S2 auscultated, no rubs, murmurs or gallops. Regular rate and rhythm.  Respiratory: Clear to auscultation bilaterally   Abdomen: Soft, mild diffuse TTP, mildly distended, + bowel sounds  Extremities: warm dry without cyanosis clubbing or edema. RLE in short leg cast  Neuro: AAOx3, nonfocal  Skin: Without rashes exudates or nodules. Small abrasion on top of buttocks  Psych: Appropriate   Data Reviewed: I have personally reviewed following labs and imaging studies  CBC:  Recent Labs Lab 05/24/15 2315 05/29/15 1048 05/30/15 0215  WBC 5.3 36.6* 25.7*  NEUTROABS  --  33.6*  --   HGB 12.1 14.0 12.6  HCT 35.3* 40.6 38.1  MCV 91.2 83.7 85.6  PLT 219 198 811   Basic Metabolic Panel:  Recent Labs Lab 05/24/15 2315 05/25/15 0235 05/29/15 1048 05/29/15 2257 05/30/15 0215 05/30/15 0528  NA 131* 132* 133* 136 137  --   K 5.4* 5.2* 5.8* 5.4* 5.0 5.0  CL 103 104 101 105 109  --   CO2 18* 18* 15* 16* 17*  --   GLUCOSE 127* 115* 84 70 108*  --   BUN 57* 54* 93* 71* 65*  --   CREATININE 1.49* 1.36* 4.03* 2.48* 2.22*  --   CALCIUM 8.4* 8.3* 8.2* 8.3* 8.1*  --    GFR: Estimated Creatinine Clearance: 30.2 mL/min (by C-G formula based on Cr of 2.22). Liver Function Tests:  Recent Labs Lab 05/24/15 2315 05/29/15 1048 05/29/15 2257 05/30/15 0215  AST _0 ALT 20 QUANTITY NOT SUFFICIENT, UNABLE TO PERFORM TEST 20 18  ALKPHOS 83 89 100 89  BILITOT 0.5 QUANTITY NOT SUFFICIENT, UNABLE TO PERFORM TEST 1.0 0.9  PROT 5.8* 6.1* 5.9* 5.2*  ALBUMIN  2.7* 2.4* 2.2* 1.9*    Recent Labs Lab 05/24/15 2315 05/29/15 1048  LIPASE 25 20   No results for input(s): AMMONIA in the last 168 hours. Coagulation Profile:  Recent Labs Lab 05/29/15 2257  INR 1.55*   Cardiac Enzymes: No results for input(s): CKTOTAL, CKMB, CKMBINDEX, TROPONINI in the last 168 hours. BNP (last 3 results) No results for input(s): PROBNP in the last 8760 hours. HbA1C: No results for input(s): HGBA1C in the last 72 hours. CBG: No results for input(s): GLUCAP in the last 168 hours. Lipid Profile: No results for input(s): CHOL, HDL, LDLCALC, TRIG, CHOLHDL, LDLDIRECT in the last 72 hours. Thyroid Function Tests: No results for input(s): TSH, T4TOTAL, FREET4, T3FREE, THYROIDAB in the last 72 hours. Anemia Panel: No results for input(s): VITAMINB12, FOLATE, FERRITIN, TIBC, IRON, RETICCTPCT in the last 72 hours. Urine analysis:    Component Value Date/Time   COLORURINE AMBER* 05/29/2015 1256   APPEARANCEUR CLOUDY* 05/29/2015 1256   LABSPEC 1.020 05/29/2015 1256   PHURINE 5.0 05/29/2015 1256   GLUCOSEU NEGATIVE 05/29/2015 1256   HGBUR MODERATE* 05/29/2015 1256   BILIRUBINUR LARGE* 05/29/2015 1256   KETONESUR 15* 05/29/2015 1256   PROTEINUR 30* 05/29/2015 1256   UROBILINOGEN 2.0* 10/28/2014 0141   NITRITE NEGATIVE 05/29/2015 1256   LEUKOCYTESUR MODERATE* 05/29/2015 1256   Sepsis Labs: _1 (procalcitonin:4,lacticidven:4)  ) Recent Results (from the past 240 hour(s))  Urine culture     Status: Abnormal (Preliminary result)   Collection Time: 05/29/15 12:56 PM  Result Value Ref Range Status   Specimen Description URINE, CATHETERIZED  Final   Special Requests NONE  Final   Culture >=100,000 COLONIES/mL GRAM NEGATIVE RODS (A)  Final   Report Status PENDING  Incomplete  MRSA PCR Screening     Status: None   Collection Time: 05/29/15  9:19 PM  Result Value Ref Range Status   MRSA by PCR NEGATIVE NEGATIVE Final    Comment:        The GeneXpert  MRSA Assay (FDA approved for NASAL specimens only), is one component of a comprehensive MRSA colonization surveillance program. It is not intended to diagnose MRSA infection nor to guide or monitor treatment for MRSA infections.  Radiology Studies: Ct Abdomen Pelvis Wo Contrast  05/29/2015  CLINICAL DATA:  Constipation for past week. Diffuse abdomen pain with nausea and vomiting. Recent fall EXAM: CT ABDOMEN AND PELVIS WITHOUT CONTRAST TECHNIQUE: Multidetector CT imaging of the abdomen and pelvis was performed following the standard protocol without oral or intravenous contrast material administration. COMPARISON:  February 14, 2015 FINDINGS: Lower chest: There is patchy bibasilar lung atelectatic change. There is cardiomegaly with a small pericardial effusion. There is a hiatal hernia with esophageal dilatation and fluid throughout the visualized esophagus. There are scattered foci of coronary artery calcification. Hepatobiliary: No focal liver lesions are identified on this noncontrast enhanced study. There is no demonstrable perihepatic fluid. Gallbladder wall is not appreciably thickened. There is no biliary duct dilatation. Pancreas: No pancreatic mass or inflammatory focus. There is fatty infiltration in the pancreas. Spleen: No splenic lesions are identified.  No perisplenic fluid. Adrenals/Urinary Tract: Adrenals appear normal bilaterally. There is a parapelvic regions cyst in the mid to upper left kidney measuring 1.4 x 1.4 cm. There is no hydronephrosis on either side. There is no renal or ureteral calculus on either side. The urinary bladder is midline with wall thickness within normal limits. Stomach/Bowel: There is moderate stool throughout the colon. There are multiple loops of dilated colon without thickened colonic wall. The transverse colon measures 7.6 cm in diameter. The ascending colon measures 8.4 cm in diameter. The: Contour and nares rather abruptly at the level of the  splenic flexure. A focal mass is not evident in this area by CT. There is no small bowel dilatation or wall thickening. No mesenteric thickening. No small bowel obstruction. No free air or portal venous air. Vascular/Lymphatic: There is atherosclerotic calcification in the aorta. No abdominal aortic aneurysm. Calcification is also noted in the common iliac arteries bilaterally. There is no obvious mesenteric vessel obstruction or high-grade stenosis. There is no adenopathy in the abdomen or pelvis. Reproductive: Uterus is absent. There is no pelvic mass or pelvic fluid collection. Other: Appendix appears normal. There is no ascites or abscess in the abdomen or pelvis. There is fat in the right inguinal ring. Musculoskeletal: There is lower lumbar levoscoliosis. There is degenerative change in the lumbar spine. There are no appreciable blastic or lytic bone lesions. There is no intramuscular lesion. There are surgical clips along the anterior abdominal wall at the rectus muscles level. IMPRESSION: Relative dilatation of the colon to the level of the splenic flexure, where there is a rather abrupt contour change. By CT, there is no mass in this area. This finding potentially could indicate focal scarring or possibly residua of old ischemic episode focally in this area. It may be prudent to consider direct visualization of this area given this abrupt contour change focally at the splenic flexure level. There is moderate stool in the colon proximal to the splenic flexure. Only modest stool is seen distal to this area. No small bowel dilatation. No small bowel wall thickening is seen on this study. No free air. There is a hiatal hernia with dilatation of the visualized distal esophagus with extensive fluid in the visualized esophagus. Question chronic reflux change. Direct visualization of the esophagus may be warranted to assess for possible Barrett's esophagus given this finding. Small pericardial effusion. Scattered  foci of coronary artery calcification noted. Appendix appears normal.  No abscess.  Uterus absent. No renal or ureteral calculus.  No hydronephrosis. Electronically Signed   By: Lowella Grip III M.D.   On: 05/29/2015 11:27  Ct Head Wo Contrast  05/29/2015  CLINICAL DATA:  60 year old female who fell out of bed this morning. Nausea vomiting, altered mental status, pain. Initial encounter. EXAM: CT HEAD WITHOUT CONTRAST CT CERVICAL SPINE WITHOUT CONTRAST TECHNIQUE: Multidetector CT imaging of the head and cervical spine was performed following the standard protocol without intravenous contrast. Multiplanar CT image reconstructions of the cervical spine were also generated. COMPARISON:  Cervical spine CT 05/12/2015 and earlier. Head CT 02/14/2015 and earlier. FINDINGS: CT HEAD FINDINGS No scalp hematoma. Chronic left forehead scalp soft tissue scarring. Visualized orbit soft tissues are within normal limits. There might be a chronic left orbital floor fracture. Visualized paranasal sinuses and mastoids are clear. Calvarium intact. Extensive Calcified atherosclerosis at the skull base. Cerebral volume is normal. No midline shift, ventriculomegaly, mass effect, evidence of mass lesion, intracranial hemorrhage or evidence of cortically based acute infarction. Gray-white matter differentiation is within normal limits throughout the brain. No suspicious intracranial vascular hyperdensity. CT CERVICAL SPINE FINDINGS The patient is status post ACDF since February. C5-C6 and C6-C7 ACDF hardware remains in place and appear stable. Stable cervical vertebral height and alignment with straightening of lordosis. Cervicothoracic junction alignment is within normal limits. Bilateral posterior element alignment is within normal limits. Visualized skull base is intact. No atlanto-occipital dissociation. Chronic cervical disc and endplate degeneration at C3-C4 and C4-C5. No acute cervical spine fracture. Visualized upper  thoracic levels appear grossly intact. Centrilobular and paraseptal emphysema in the lung apices. Negative noncontrast paraspinal soft tissues. The proximal esophagus is distended with air in fluid today (series 302, image 73). Visualized superior mediastinum otherwise stable and negative. IMPRESSION: 1. Stable and Normal noncontrast CT appearance of the brain. 2. No acute fracture or listhesis identified in the cervical spine. Ligamentous injury is not excluded. 3. Stable C5-C6 and C6-C7 ACDF. 4. Dilated, fluid-filled proximal esophagus today. Etiology and significance unclear. See CT Abdomen and Pelvis reported separately. 5. Pulmonary emphysema. Electronically Signed   By: Genevie Ann M.D.   On: 05/29/2015 11:21   Ct Cervical Spine Wo Contrast  05/29/2015  CLINICAL DATA:  60 year old female who fell out of bed this morning. Nausea vomiting, altered mental status, pain. Initial encounter. EXAM: CT HEAD WITHOUT CONTRAST CT CERVICAL SPINE WITHOUT CONTRAST TECHNIQUE: Multidetector CT imaging of the head and cervical spine was performed following the standard protocol without intravenous contrast. Multiplanar CT image reconstructions of the cervical spine were also generated. COMPARISON:  Cervical spine CT 05/12/2015 and earlier. Head CT 02/14/2015 and earlier. FINDINGS: CT HEAD FINDINGS No scalp hematoma. Chronic left forehead scalp soft tissue scarring. Visualized orbit soft tissues are within normal limits. There might be a chronic left orbital floor fracture. Visualized paranasal sinuses and mastoids are clear. Calvarium intact. Extensive Calcified atherosclerosis at the skull base. Cerebral volume is normal. No midline shift, ventriculomegaly, mass effect, evidence of mass lesion, intracranial hemorrhage or evidence of cortically based acute infarction. Gray-white matter differentiation is within normal limits throughout the brain. No suspicious intracranial vascular hyperdensity. CT CERVICAL SPINE FINDINGS The  patient is status post ACDF since February. C5-C6 and C6-C7 ACDF hardware remains in place and appear stable. Stable cervical vertebral height and alignment with straightening of lordosis. Cervicothoracic junction alignment is within normal limits. Bilateral posterior element alignment is within normal limits. Visualized skull base is intact. No atlanto-occipital dissociation. Chronic cervical disc and endplate degeneration at C3-C4 and C4-C5. No acute cervical spine fracture. Visualized upper thoracic levels appear grossly intact. Centrilobular and paraseptal emphysema in the lung apices.  Negative noncontrast paraspinal soft tissues. The proximal esophagus is distended with air in fluid today (series 302, image 73). Visualized superior mediastinum otherwise stable and negative. IMPRESSION: 1. Stable and Normal noncontrast CT appearance of the brain. 2. No acute fracture or listhesis identified in the cervical spine. Ligamentous injury is not excluded. 3. Stable C5-C6 and C6-C7 ACDF. 4. Dilated, fluid-filled proximal esophagus today. Etiology and significance unclear. See CT Abdomen and Pelvis reported separately. 5. Pulmonary emphysema. Electronically Signed   By: Genevie Ann M.D.   On: 05/29/2015 11:21   Dg Chest Portable 1 View  05/29/2015  CLINICAL DATA:  60 year old female with altered mental status. Recent chest pain. Initial encounter. EXAM: PORTABLE CHEST 1 VIEW COMPARISON:  05/25/2015 and earlier. FINDINGS: Portable AP semi upright view at at 1233 hours. Lower lung volumes. Mildly increased pulmonary vascularity without overt edema. Stable cardiac size and mediastinal contours. No pneumothorax. No pleural effusion or confluent pulmonary opacity identified. Cervical ACDF hardware re- demonstrated. IMPRESSION: Lower lung volumes with increased pulmonary vascularity but no acute pulmonary edema. Electronically Signed   By: Genevie Ann M.D.   On: 05/29/2015 13:05     Scheduled Meds: . atorvastatin  40 mg Oral  q1800  . budesonide (PULMICORT) nebulizer solution  0.25 mg Nebulization BID  . cefTAZidime (FORTAZ)  IV  2 g Intravenous Q24H  . hydrocortisone sod succinate (SOLU-CORTEF) inj  50 mg Intravenous Daily  . metoCLOPramide  2.5 mg Oral TID AC & HS  . pantoprazole  40 mg Oral Daily  . polyethylene glycol  17 g Oral QHS   Continuous Infusions: . sodium chloride 125 mL/hr at 05/30/15 0500     LOS: 1 day   Time Spent in minutes   30 minutes  ,  D.O. on 05/30/2015 at 12:41 PM  Between 7am to 7pm - Pager - [phone]  After 7pm go to [email or web address] - password [email or web address]  And look for the night coverage person covering for me after hours  [facility] Office  [phone]

## 2015-05-30 NOTE — Progress Notes (Addendum)
Pharmacy Antibiotic Note  [name] is a 60 y.o. female admitted on 05/29/2015 with sepsis.  Pharmacy was initially consulted for Vancomycin + Azactam dosing >> upon chart review, the patient has tolerated cephalosporins in the past so will transition Azactam to Ceftazidime this AM.  The patient inadvertently was loaded with Vancomycin 3g on 5/18 (1g by EDP + 1g by Rx - which was intentional to achieve a 1g LD, followed by 1g that was defaulted on the admitting orderset). A SZP for submitted for this incident. Will plan to hold doses and check a VR to further clarify when to restart.  The patient was noted to have AoCKD on admit - resolving, SCr 2.22 << 4.03, CrCl~30 ml/min.   Plan: 1. D/c Azactam and change to Ceftazidime 2g IV every 24 hours 2. Hold Vancomycin for now - will get a VR this evening and re-dose at that time 3. Will continue to follow renal function, culture results, LOT, and antibiotic de-escalation plans   Height: 5\' 6"  (167.6 cm) Weight: 199 lb 8.3 oz (90.5 kg) IBW/kg (Calculated) : 59.3  Temp (24hrs), Avg:98.3 F (36.8 C), Min:97.6 F (36.4 C), Max:99.1 F (37.3 C)   Recent Labs Lab 05/24/15 2315 05/25/15 0235 05/29/15 1048 05/29/15 1152 05/29/15 1437 05/29/15 2257 05/30/15 0215 05/30/15 0711  WBC 5.3  --  36.6*  --   --   --  25.7*  --   CREATININE 1.49* 1.36* 4.03*  --   --  2.48* 2.22*  --   LATICACIDVEN  --   --   --  2.17* 2.44* 2.8* 2.6* 1.9    Estimated Creatinine Clearance: 30.2 mL/min (by C-G formula based on Cr of 2.22).    Allergies  Allergen Reactions  . Iohexol Anaphylaxis, Swelling and Other (See Comments)      Throat swelling    . Levofloxacin In D5w Other (See Comments)    Has baseline prolonged QTc.  Avoid QTc prolonging medications.  Tresa Moore Hcl] Other (See Comments)    Has baseline prolonged QTc.  Avoid QTc prolonging medications.  . Pork-Derived Products Nausea And Vomiting and Other (See Comments)    Reaction  to pigs feet and knuckles   . 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 Penicillins Itching    Has patient had a PCN reaction causing immediate rash, facial/tongue/throat swelling, SOB or lightheadedness with hypotension: yes Has patient had a PCN reaction causing severe rash involving mucus membranes or skin necrosis: unknown Has patient had a PCN reaction that required hospitalization : no, reacted while in the Drs office Has patient had a PCN reaction occurring within the last 10 years: yes If all of the above answers are "NO", then may proceed with Cephalosporin use.     Antimicrobials this admission: Azactam 5/18 >> 5/19 Vanc 5/18 >>  Ceftazidime 5/19 >>  Dose adjustments this admission: n/a  Microbiology results: 5/18 MRSA PCR >> neg 5/18 UCx >> 5/18 BCx >>  Thank you for allowing pharmacy to be a part of this patient's care.  Alycia Rossetti, PharmD, BCPS Clinical Pharmacist Pager: [phone] 05/30/2015 9:10 AM

## 2015-05-30 NOTE — Progress Notes (Signed)
CRITICAL VALUE ALERT  Critical value received:  Lactic acid 2.8  Date of notification:  05/30/2015   Time of notification:  12:39 AM   Critical value read back: yes  Nurse who received alert:  Reatha Armour RN  MD notified (1st page):  Kathline Magic  Time of first page:  12:40 AM   MD notified (2nd page):  Time of second page:  Responding MD:    Time MD responded:  [phone]

## 2015-05-30 NOTE — Progress Notes (Signed)
Patient expressed "my chest hurts."  Patient also endorses severe abdominal pain and has a distended abdomen. Her pain is intermittent. EKG completed.  With encouragement, the patient describes the pain as a cramping sensation and points to her upper abdomen (indicating the area of her chest that hurts).  Patient given medication.  Blood pressure is 85/54 at the time. Nitroglycerin was not given. On call provider notified.

## 2015-05-31 LAB — BASIC METABOLIC PANEL
Anion gap: 11 (ref 5–15)
BUN: 44 mg/dL — AB (ref 6–20)
CALCIUM: 8 mg/dL — AB (ref 8.9–10.3)
CO2: 10 mmol/L — AB (ref 22–32)
Chloride: 122 mmol/L — ABNORMAL HIGH (ref 101–111)
Creatinine, Ser: 1.36 mg/dL — ABNORMAL HIGH (ref 0.44–1.00)
GFR calc Af Amer: 48 mL/min — ABNORMAL LOW (ref >60)
GFR, EST NON AFRICAN AMERICAN: 41 mL/min — AB (ref >60)
GLUCOSE: 66 mg/dL (ref 65–99)
Potassium: 6.6 mmol/L (ref 3.5–5.1)
Sodium: 143 mmol/L (ref 135–145)

## 2015-05-31 LAB — POTASSIUM
Potassium: 4.6 mmol/L (ref 3.5–5.1)
Potassium: 4.6 mmol/L (ref 3.5–5.1)

## 2015-05-31 LAB — CBC
HCT: 32.6 % — ABNORMAL LOW (ref 36.0–46.0)
Hemoglobin: 10.8 g/dL — ABNORMAL LOW (ref 12.0–15.0)
MCH: 28.4 pg (ref 26.0–34.0)
MCHC: 33.1 g/dL (ref 30.0–36.0)
MCV: 85.8 fL (ref 78.0–100.0)
PLATELETS: 211 10*3/uL (ref 150–400)
RBC: 3.8 MIL/uL — ABNORMAL LOW (ref 3.87–5.11)
RDW: 15.9 % — AB (ref 11.5–15.5)
WBC: 17.1 10*3/uL — AB (ref 4.0–10.5)

## 2015-05-31 LAB — URINE CULTURE: Culture: >=100000 — AB

## 2015-05-31 IMAGING — CR DG ABDOMEN 2V
2 series · 2 of 2 positions shown · non-contrast
Comparison: 11/13/2013 and CT 09/10/2013

CLINICAL DATA: Lower to mid chest pain as well as abdominal pain
with 3-4 bowel movements past 2 months. Nausea vomiting 2 days.

EXAM:
ABDOMEN - 2 VIEW

[w abdomen decub]
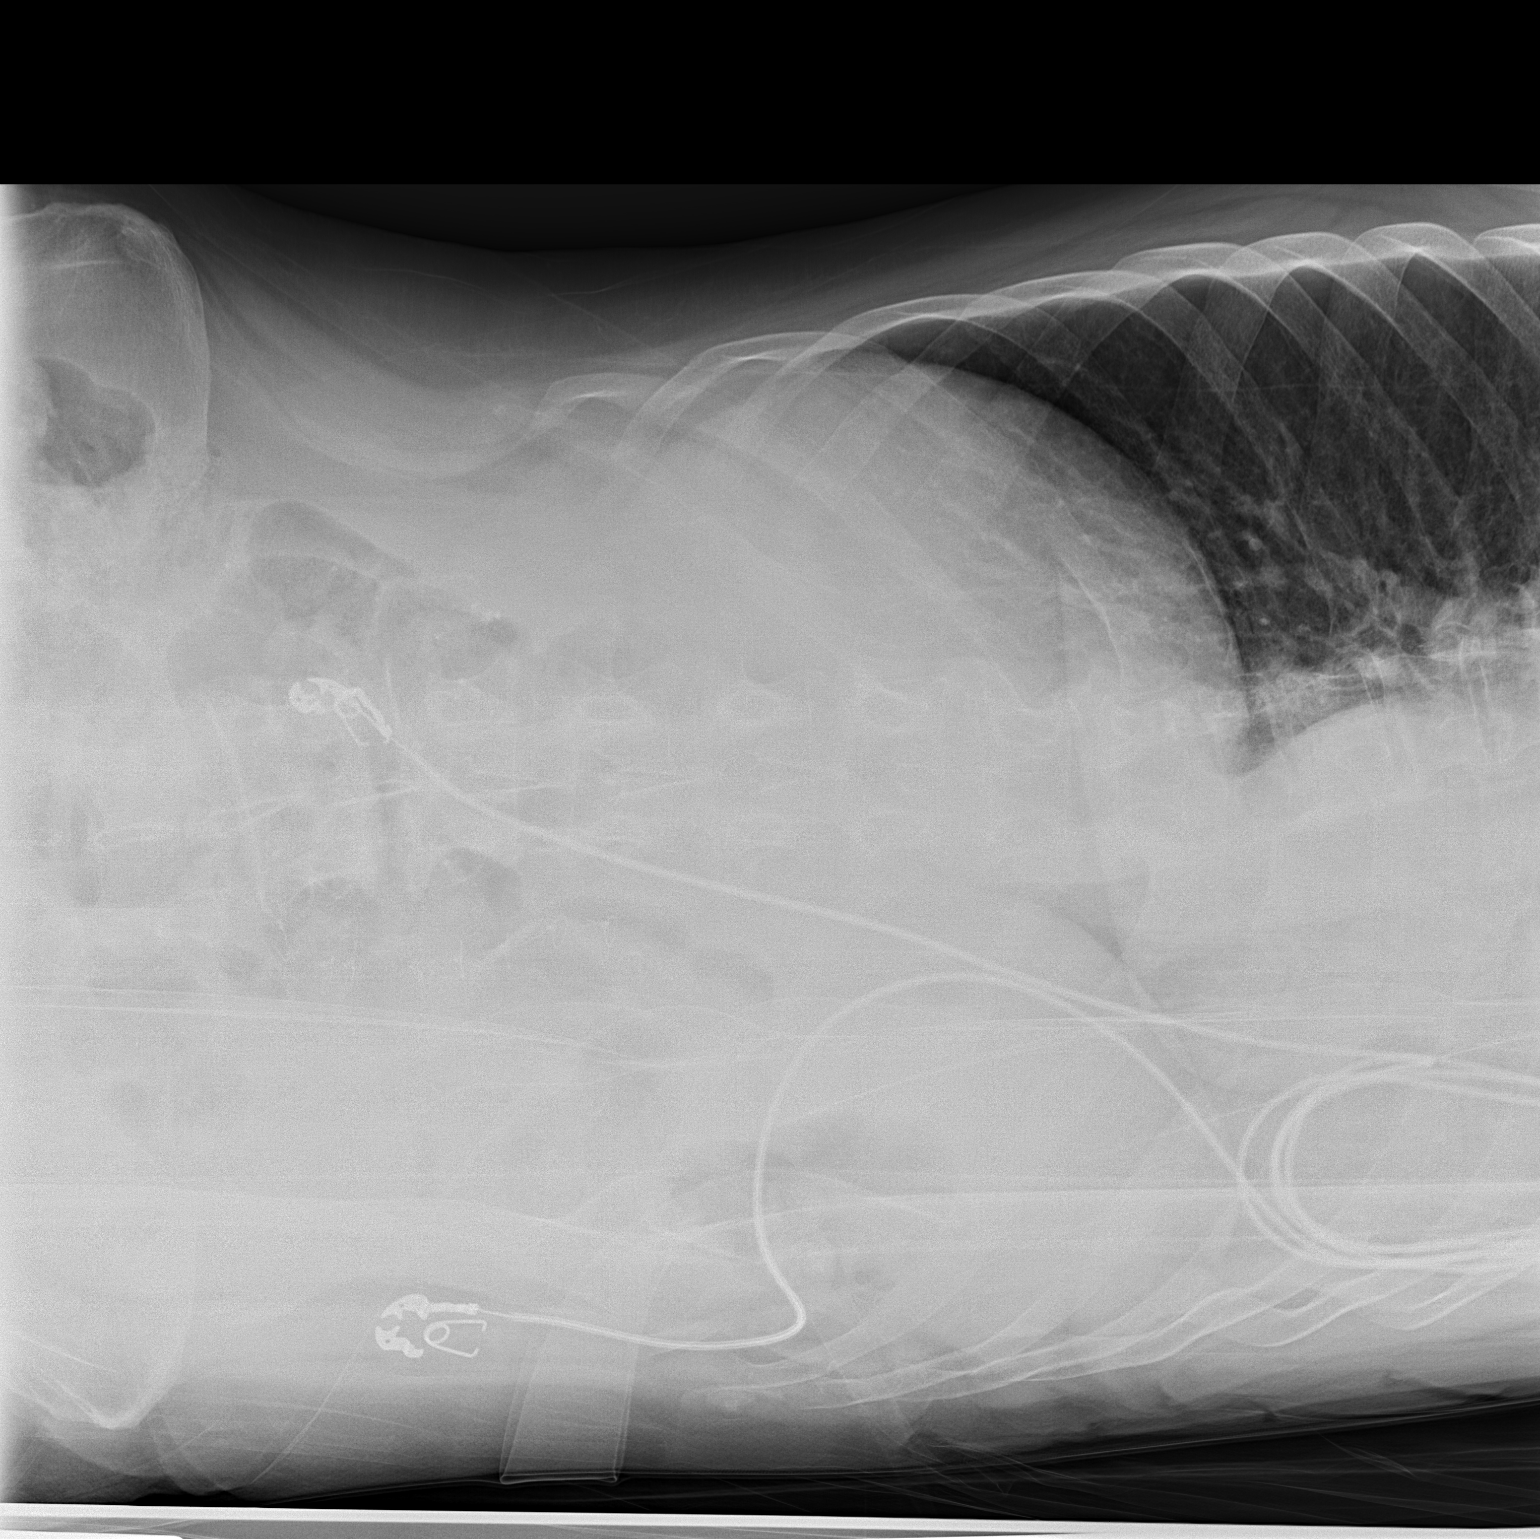

[x abdomen supine]
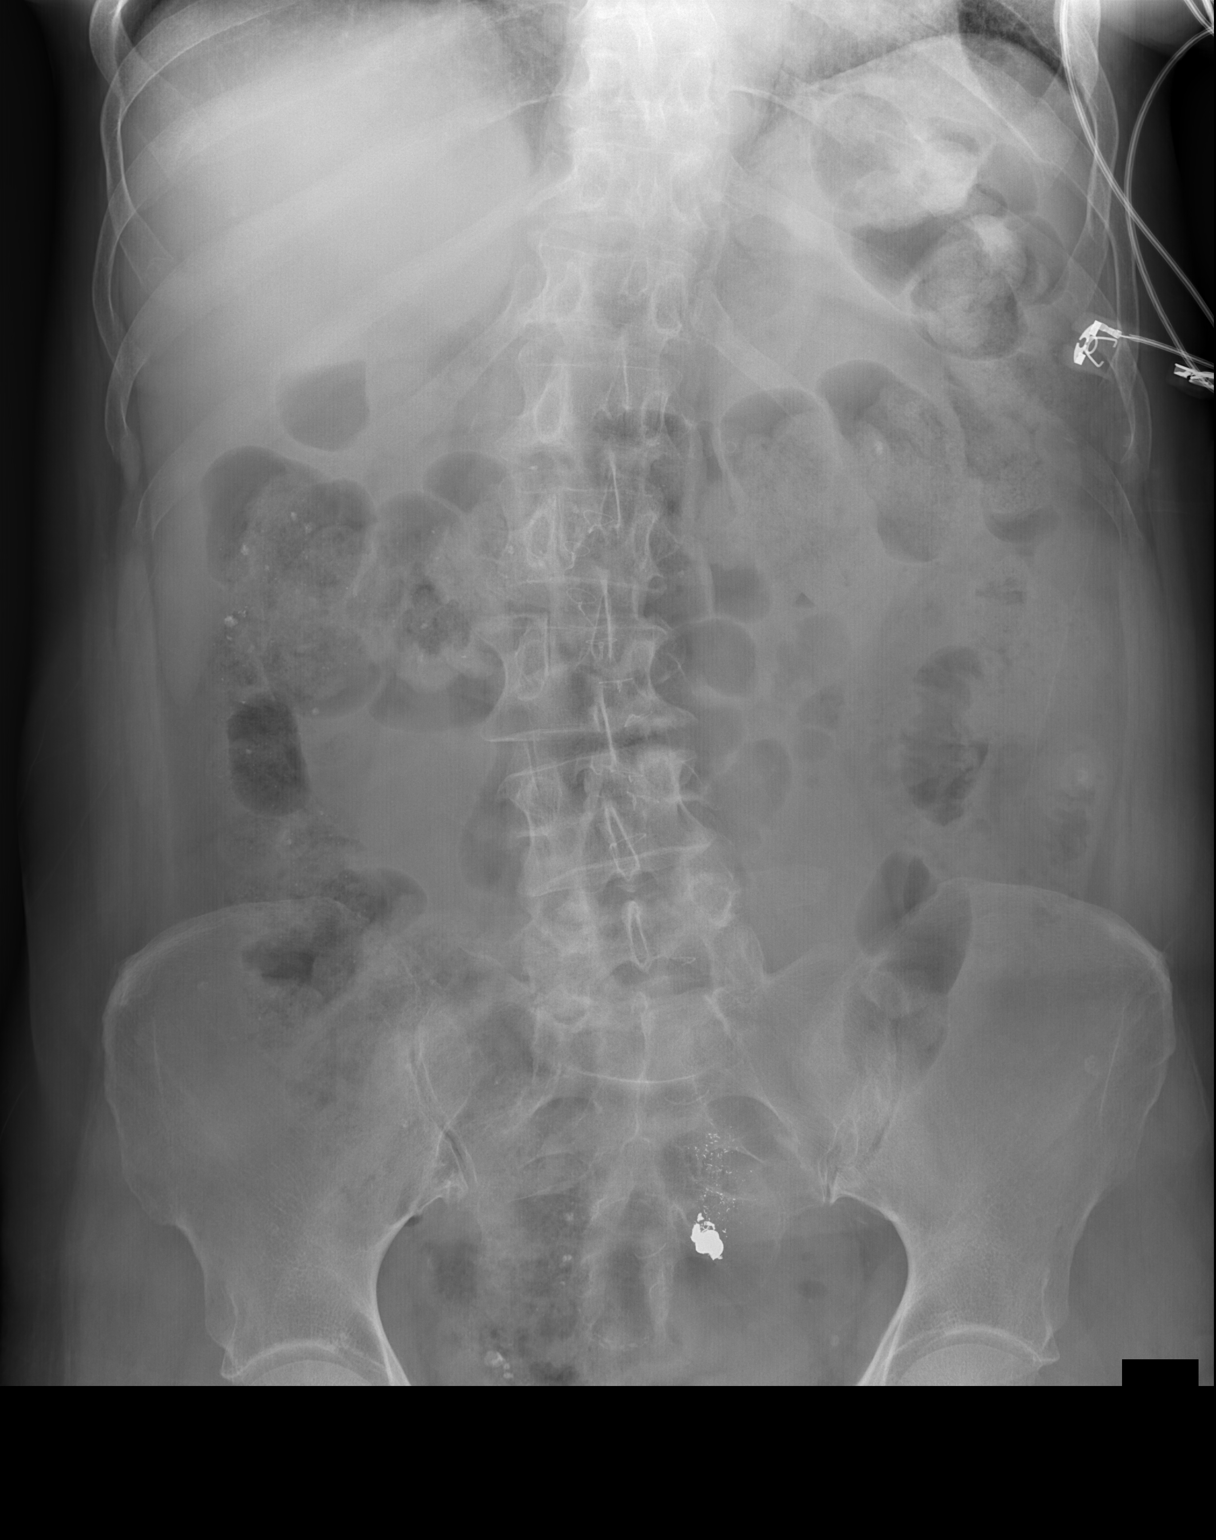

[2 of 2 positions shown; findings below may reference images not displayed]

FINDINGS: Bowel gas pattern is nonobstructive with mild fecal retention
throughout the colon. No free peritoneal air. Stable metallic
fragments over the pelvis. Surgical sutures over the midline abdomen
unchanged. Degenerative changes of the spine with mild curvature
convex to the right stable.
IMPRESSION: Nonspecific, nonobstructive bowel gas pattern.

## 2015-05-31 IMAGING — CR DG CHEST 2V
2 series · 2 of 2 positions shown · non-contrast
Comparison: 05/28/2013

CLINICAL DATA: Mid chest pain

EXAM:
CHEST  2 VIEW

[w chest lat]
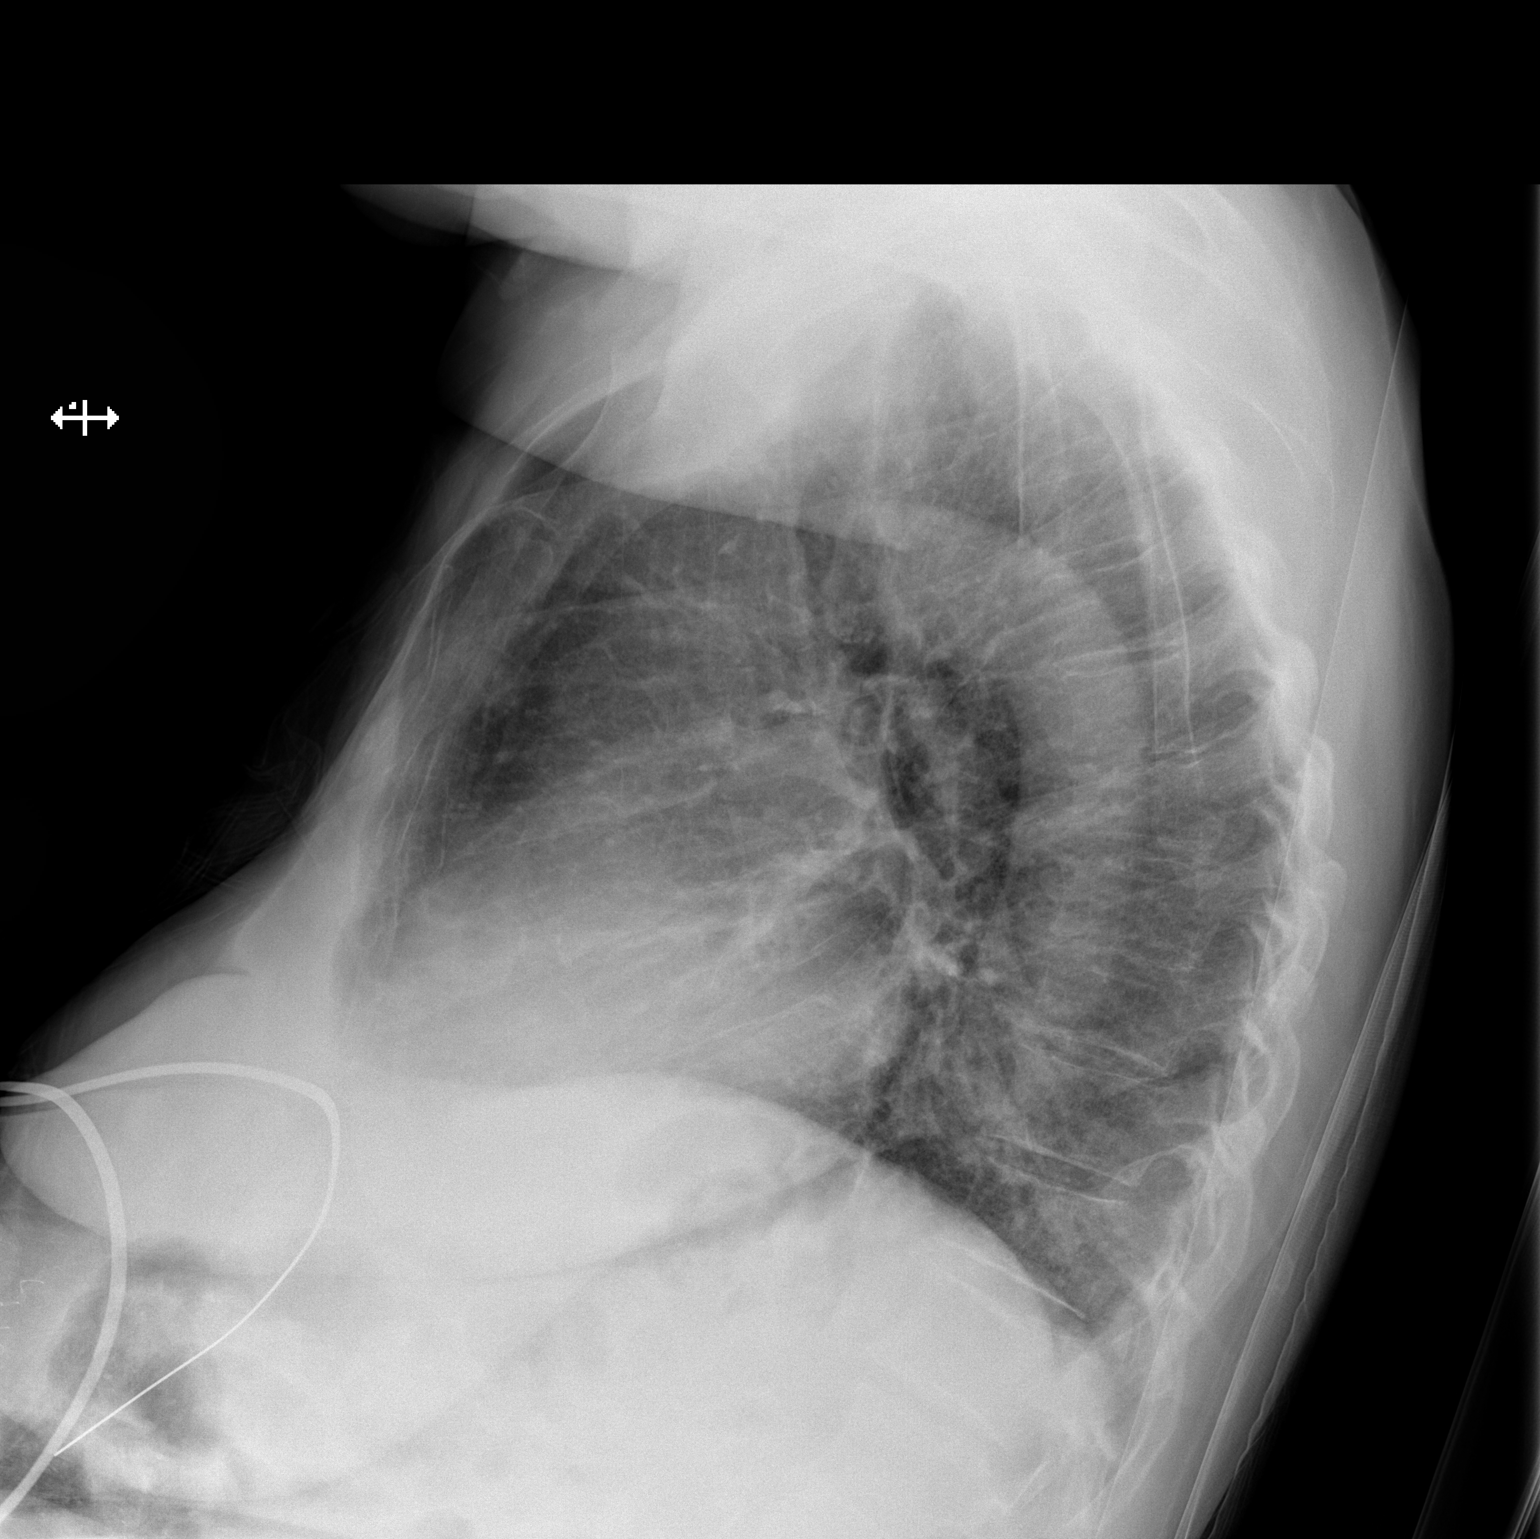

[x chest ap]
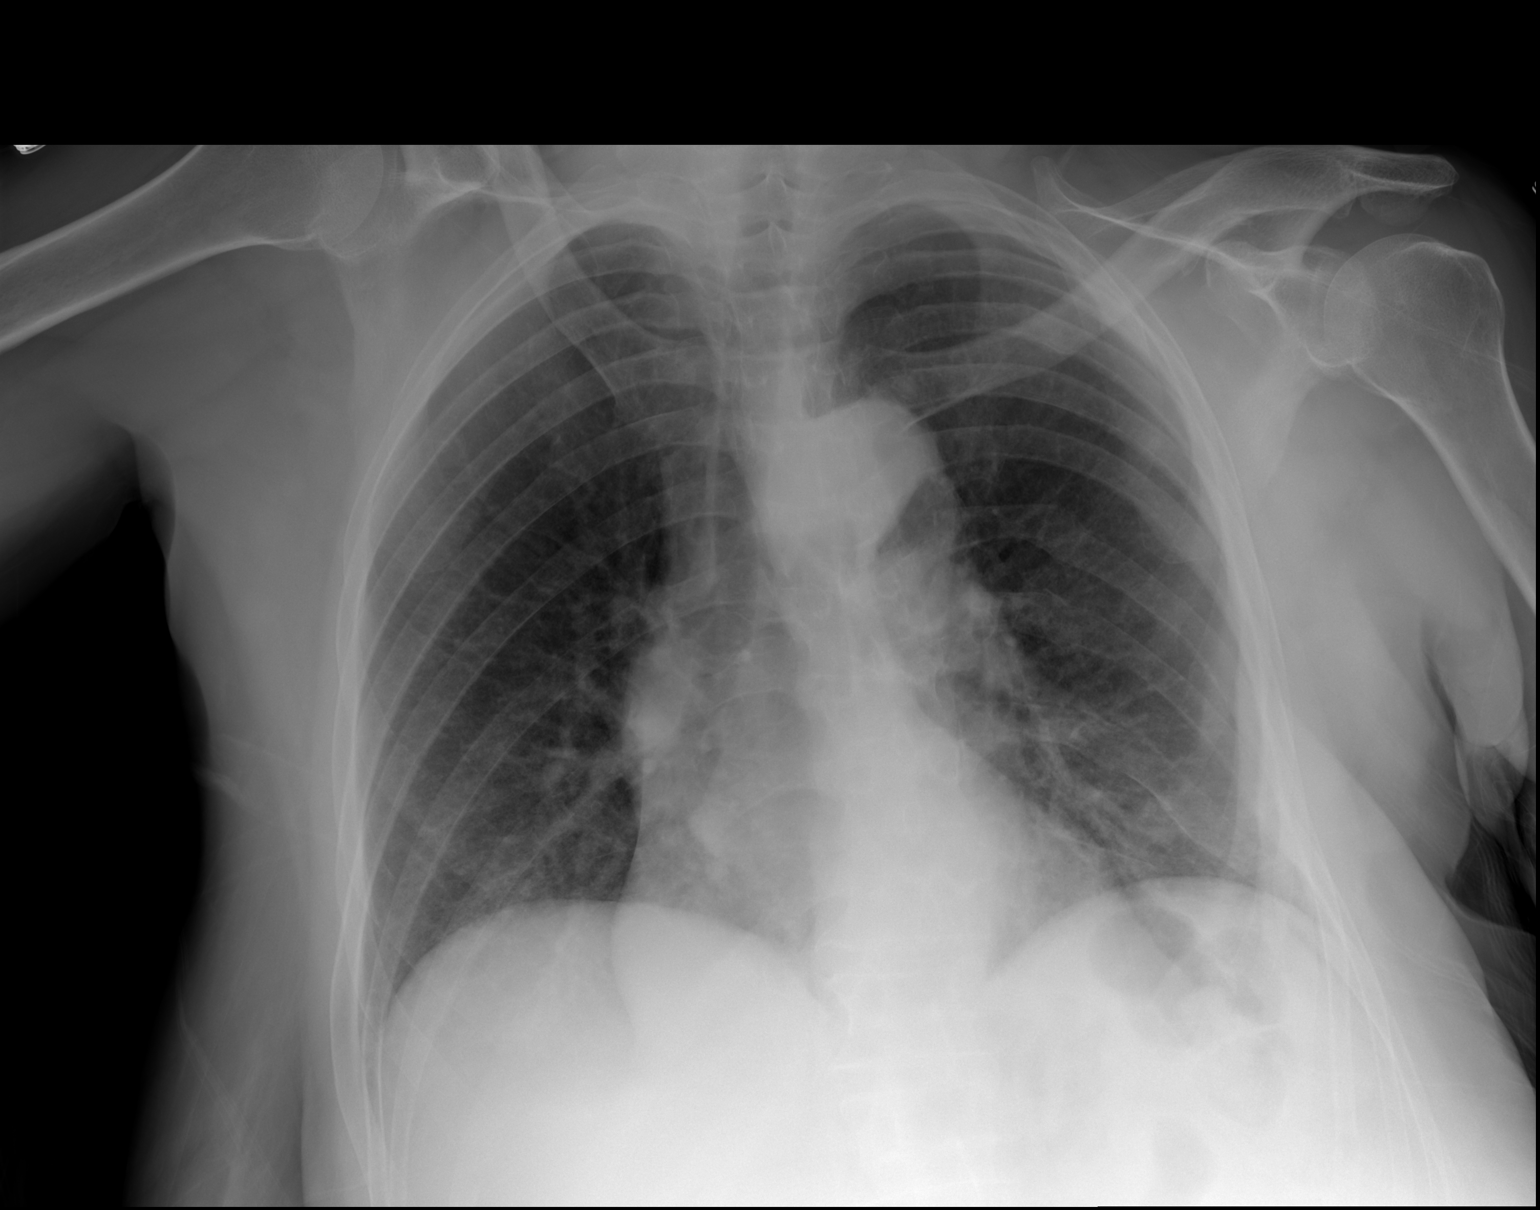

[2 of 2 positions shown; findings below may reference images not displayed]

FINDINGS: Mild patchy bibasilar opacities, likely atelectasis. No pleural
effusion or pneumothorax.

The heart is top-normal in size.

Mild degenerative changes of the visualized thoracolumbar spine.
IMPRESSION: Mild patchy bibasilar opacities, likely atelectasis.

## 2015-05-31 IMAGING — CT CT ABD-PELV W/O CM
1 of 2 series · 15 of 32 positions shown, 19 images · non-contrast
Comparison: 09/10/2013

CLINICAL DATA: Generalized abdominal pain.

EXAM:
CT ABDOMEN AND PELVIS WITHOUT CONTRAST
TECHNIQUE: Multidetector CT imaging of the abdomen and pelvis was performed
following the standard protocol without IV contrast. Patient has IV
contrast allergy.

[Series 2: abd/pel w/o · axial · non-contrast · 0.74mm/px · z∈[+1208,+1608]mm · 15 of 88 slices shown, 19 images]
[im 4/88  soft-tissue]
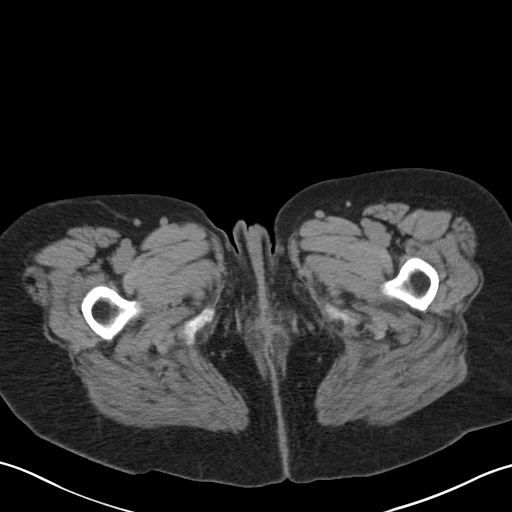
[im 4/88  bone]
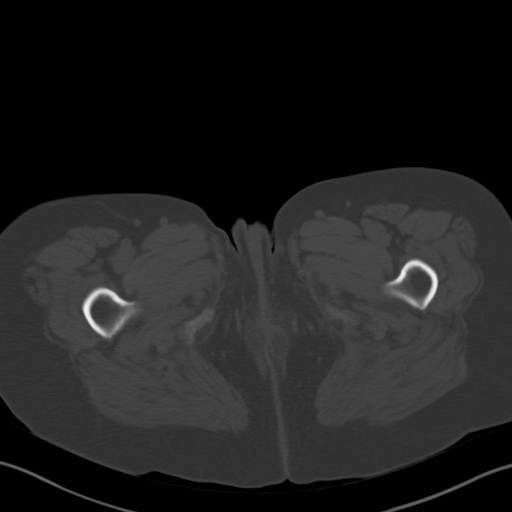
[im 11/88  soft-tissue]
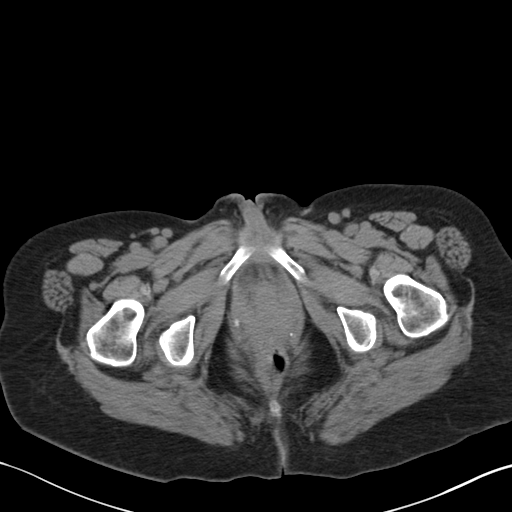
[im 17/88  soft-tissue]
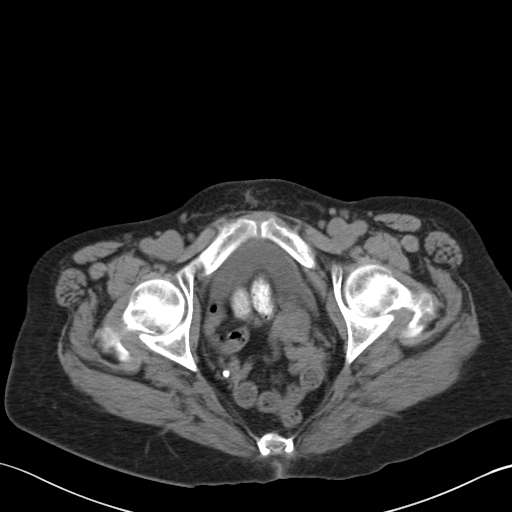
[im 24/88  soft-tissue]
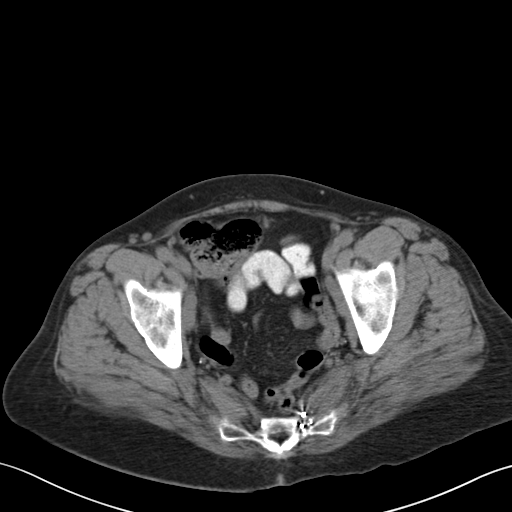
[im 31/88  soft-tissue]
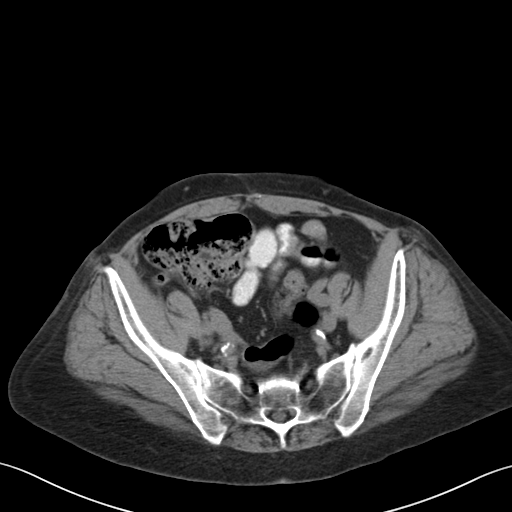
[im 37/88  soft-tissue]
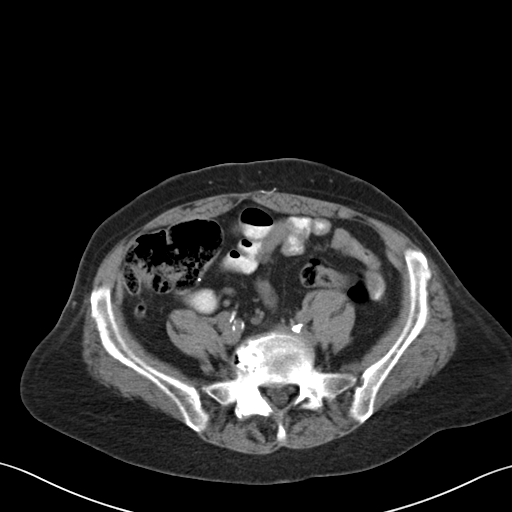
[im 44/88  soft-tissue]
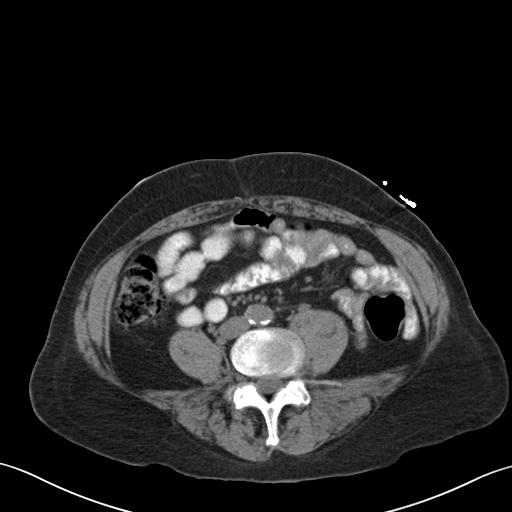
[im 51/88  soft-tissue]
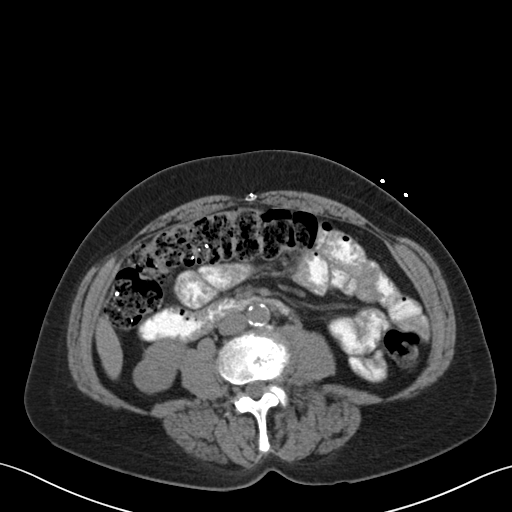
[im 57/88  soft-tissue]
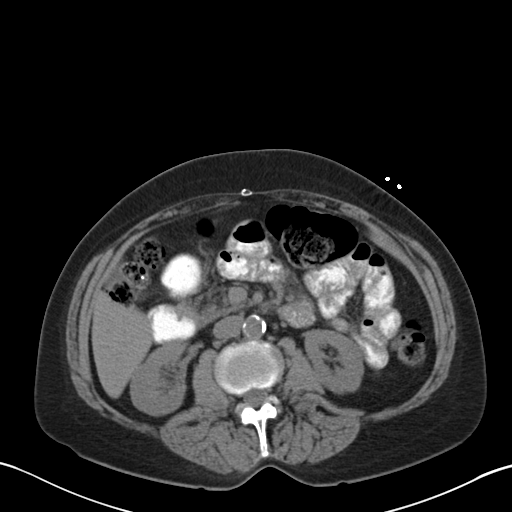
[im 57/88  bone]
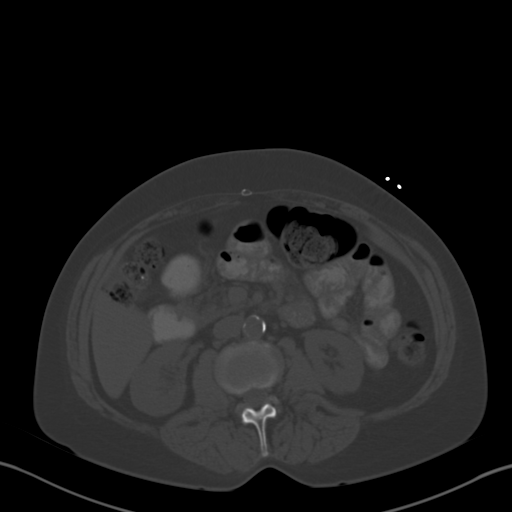
[im 64/88  soft-tissue]
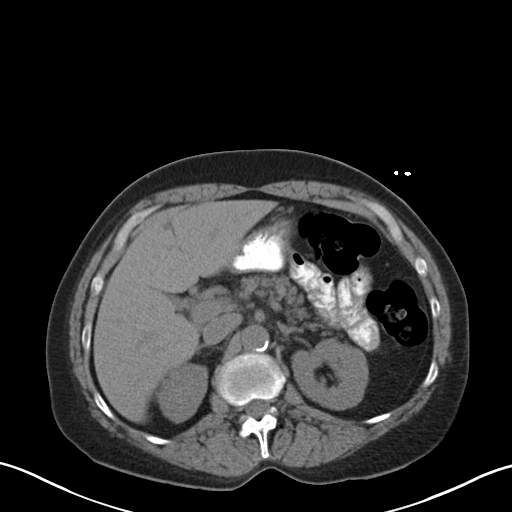
[im 71/88  soft-tissue]
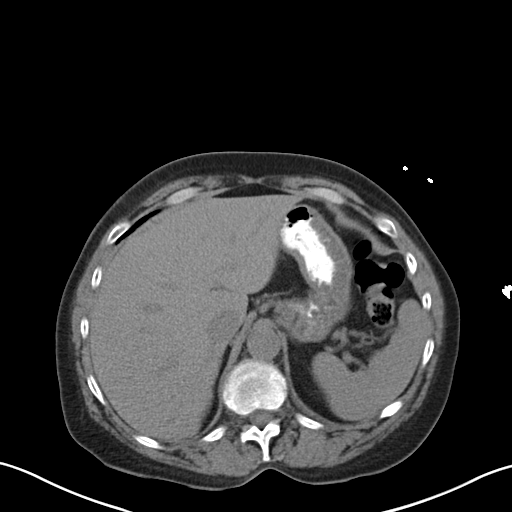
[im 74/88  lung]
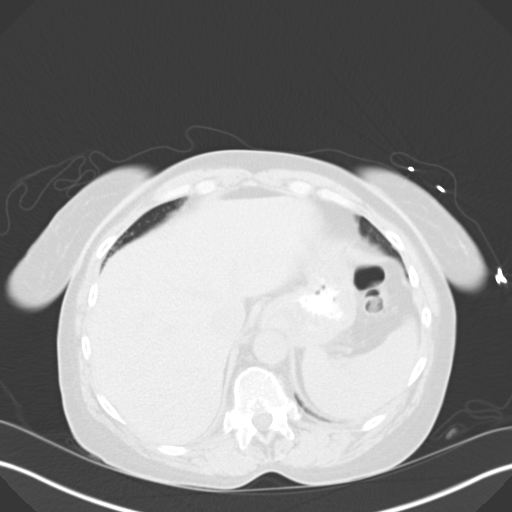
[im 77/88  soft-tissue]
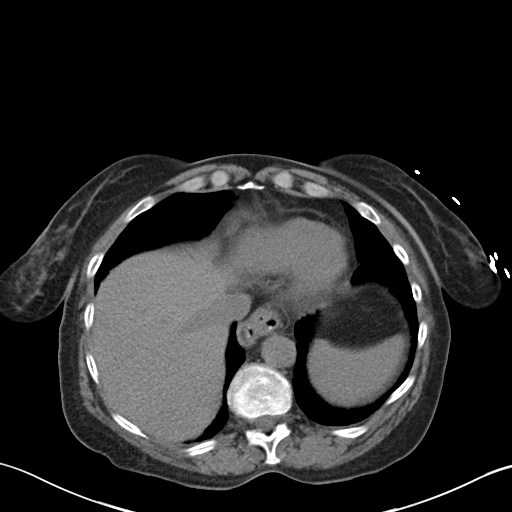
[im 77/88  lung]
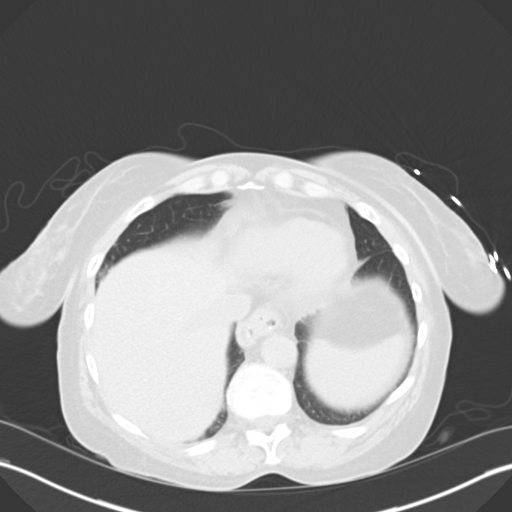
[im 81/88  lung]
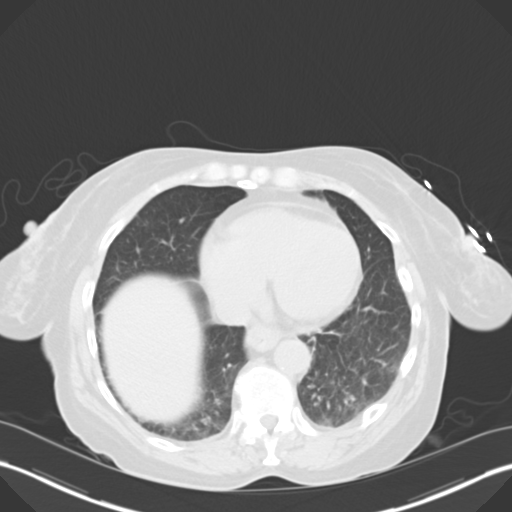
[im 84/88  soft-tissue]
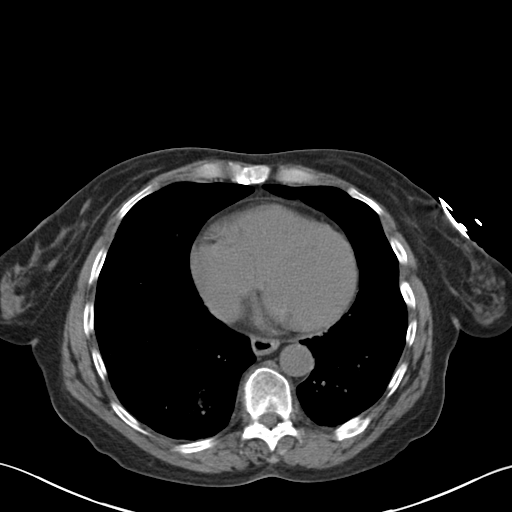
[im 84/88  lung]
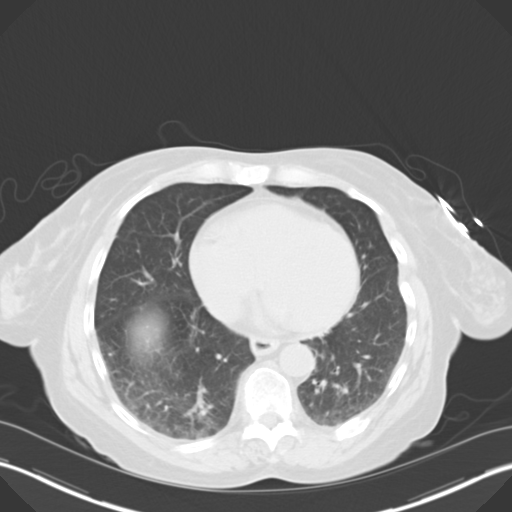

[15 of 32 positions shown; findings below may reference images not displayed]

FINDINGS: Lung bases demonstrate mild dependent atelectasis. Possible small
sliding hiatal hernia.

Abdominal images demonstrate a sub cm hypodensity over the right
lobe of the liver unchanged and likely a cyst or hemangioma. The
spleen, pancreas, gallbladder and adrenal glands are within normal.
Kidneys are normal in size without evidence of hydronephrosis or
nephrolithiasis. Ureters are unremarkable. There is mild moderate
calcified plaque involving the abdominal aorta and iliac arteries.
Appendix is within normal. There is no free fluid or focal
inflammatory change. There are sutures over the midline anterior
abdominal wall.

Pelvic images demonstrate surgical absence of the uterus. Multiple
phleboliths are present. The bladder and rectosigmoid colon are
unremarkable. There are multiple metallic fragments over the left
sacrum which streak artifact unchanged. There are degenerative
changes of the spine and hips.
IMPRESSION: No acute findings in the abdomen/pelvis.

Sub cm right liver cyst versus hemangioma unchanged.

Possible small hiatal hernia.

## 2015-05-31 MED ORDER — BISACODYL 10 MG RE SUPP
10.0000 mg | Freq: Once | RECTAL | Status: AC
  Administered 2015-05-31: 10 mg via RECTAL
  Filled 2015-05-31: qty 1

## 2015-05-31 MED ORDER — DEXTROSE 5 % IV SOLN
2.0000 g | INTRAVENOUS | Status: DC
  Administered 2015-05-31: 2 g via INTRAVENOUS
  Filled 2015-05-31 (×2): qty 2

## 2015-05-31 MED ORDER — SODIUM CHLORIDE 0.9% FLUSH: 10.0000 mL | INTRAVENOUS | Status: DC | PRN

## 2015-05-31 MED ORDER — SODIUM CHLORIDE 0.9% FLUSH
10.0000 mL | Freq: Two times a day (BID) | INTRAVENOUS | Status: DC
  Administered 2015-06-02: 10 mL

## 2015-05-31 NOTE — Progress Notes (Signed)
CRITICAL VALUE ALERT  Critical value received:  K+  Date of notification:  05/31/15  Time of notification:  1250  Critical value read back:Yes.    Nurse who received alert:  Precious Gilding RN  MD notified (1st page):  Dr Ree Kida  Time of first page:  1253  Responding MD:  Dr Ree Kida  Time MD responded:  [phone]

## 2015-05-31 NOTE — Progress Notes (Signed)
PROGRESS NOTE    [name]  UXL:[id number] DOB: May 04, 1955 DOA: 05/29/2015 PCP: Philis Fendt, MD   Brief Narrative:  On 05/29/2015 [name] is a 60 y.o. female with a medical history of COPD, essential hypertension, chronic kidney disease, cervical spine stenosis, but present to the emergency department after falling at the nursing facility if this today and complaining of right shoulder pain. Patient was given narcotic medications prior to arrival. She is currently somnolent, but arousable. Information in this history and physical were obtained however from EDP and prior records. Currently patient denies any chest pain, shortness of breath, headache, dizziness, back or neck pain. She does complain of abdominal pain. She was recently seen in emergency department a few days ago for nausea and vomiting. Patient does have a short leg cast on her right lower extremity however cannot tell me when this was placed. She cannot tell me he was been following her for this. [facility] called for admission.  Assessment & Plan   Severe sepsis likely secondary to UTI -Upon admission, patient leukocytosis of 36.6, lactic acid 2.44, hypotensive, acute kidney injury -Lactic acid and WBC improving -UA shows the PVCs 6-30, many bacteria, moderate leukocytes, calcium oxalate crystals -Urine culture >100K Ecoli -Blood cultures show no growth to date -CT abdomen and pelvis showed relative dilatation of the colon to the level of the splenic flexure where there is an abrupt contour change. No CT in this area, potentially could indicate focal scarring or residual of old ischemic episode -Was placed on vanc and aztreonam (given PCN allergy and MRSA) -Will discontinue vancomycin and continue cefepime  Acute on chronic kidney injury, stage III/ metabolic acidosis -Baseline creatinine approximately 1.2-1.4, was 4.03 upon admission -Likely secondary to sepsis -Will hold nephrotoxic agents (ACE inhibitor) -Continue  on IV fluids and continue to monitor BMP -Creatinine trending downward, pending BMP this morning.    Hyperkalemia -Resolved, Likely secondary to acute kidney injury -Continue to monitor  Abdominal pain -Question whether this is related to CT findings as mentioned above versus constipation -Improving after bowel regimen started, will continue -AST and lipase, alk phosphatase within normal limits (ALT and bilirubin not calculated)  Leukocytosis -Improving, Most likely secondary to sepsis however patient was on Decadron -Will continue to monitor CBC along with the above treatment and plan  Somnolence -Resolved, Likely secondary to narcotics -Currently patient has no neurological deficits -CT head: Normal CT appearance of the brain, no acute fractures  COPD -Currently stable, no active wheezing -Continue home medications  Essential hypertension -Patient is currently hypotensive, will place on IV fluids -Hold amlodipine, lisinopril -Placed on solucortef for possible AI  Hyperlipidemia -Continue statin  Cervical stenosis -Status post ACDF with Dr. Kathyrn Sheriff, neurosurgery -Patient was recently admitted and discharged on 05/16/2015 -Spoke with Dr. Kathyrn Sheriff via phone, can taper decadron  Right shoulder pain -Able to move RUE with ease -Shoulder xray negative  Right lateral malleolus nondisplaced ankle fracture -Patient does have short leg cast in place, done on 05/16/2015 -Patient is to follow with Dr. Lorin Mercy in 2-3 weeks. -Spoke with Dr. Lorin Mercy 5/19, follow up in the office after discharge  DVT Prophylaxis  SCDs  Code Status: Full  Family Communication: None at bedside  Disposition Plan: Admitted. Will transfer to tele.  Continue to monitor. Pending PT. Possible discharge within 1-2 days.  Consultants Dr. Kathyrn Sheriff, neurosurgery, via phone Dr. Lorin Mercy, ortho, via phone  Procedures  None  Antibiotics   Anti-infectives    Start     Dose/Rate Route  Frequency Ordered  Stop   05/30/15 1400  aztreonam (AZACTAM) injection 1 g  Status:  Discontinued     1 g Intramuscular Every 8 hours 05/29/15 1746 05/29/15 2126   05/30/15 1400  cefTAZidime (FORTAZ) 2 g in dextrose 5 % 50 mL IVPB     2 g 100 mL/hr over 30 Minutes Intravenous Every 24 hours 05/30/15 0859     05/30/15 0600  aztreonam (AZACTAM) 1 g in dextrose 5 % 50 mL IVPB  Status:  Discontinued     1 g 100 mL/hr over 30 Minutes Intravenous Every 8 hours 05/29/15 2127 05/30/15 0859   05/29/15 2130  aztreonam (AZACTAM) 2 g in dextrose 5 % 50 mL IVPB     2 g 100 mL/hr over 30 Minutes Intravenous  Once 05/29/15 2120 05/29/15 2255   05/29/15 2130  vancomycin (VANCOCIN) IVPB 1000 mg/200 mL premix     1,000 mg 200 mL/hr over 60 Minutes Intravenous  Once 05/29/15 2120 05/30/15 0130   05/29/15 1800  vancomycin (VANCOCIN) IVPB 1000 mg/200 mL premix     1,000 mg 200 mL/hr over 60 Minutes Intravenous STAT 05/29/15 1746 05/29/15 2011   05/29/15 1415  ceFEPIme (MAXIPIME) 2 g in dextrose 5 % 50 mL IVPB     2 g 100 mL/hr over 30 Minutes Intravenous  Once 05/29/15 1410 05/29/15 1443   05/29/15 1245  levofloxacin (LEVAQUIN) IVPB 750 mg  Status:  Discontinued     750 mg 100 mL/hr over 90 Minutes Intravenous  Once 05/29/15 1230 05/29/15 1410   05/29/15 1245  aztreonam (AZACTAM) 2 g in dextrose 5 % 50 mL IVPB  Status:  Discontinued     2 g 100 mL/hr over 30 Minutes Intravenous  Once 05/29/15 1230 05/29/15 1410   05/29/15 1245  vancomycin (VANCOCIN) IVPB 1000 mg/200 mL premix     1,000 mg 200 mL/hr over 60 Minutes Intravenous  Once 05/29/15 1230 05/29/15 1622      Subjective:   [name] seen and examined today.  Continues to complain of abdominal pain but feels it improved after a bowel movement yesterday.  Denies nausea or vomiting. Denies chest pain, shortness of breath, nausea or vomiting, dizziness, headache.   Objective:   Filed Vitals:   05/30/15 2131 05/31/15 0000 05/31/15 0307 05/31/15 0803  BP:   113/64 98/48   Pulse:  103 105   Temp:  97.8 F (36.6 C) 98.9 F (37.2 C)   TempSrc:  Oral Oral   Resp:  18 16   Height:      Weight:      SpO2: 99% 99% 97% 100%    Intake/Output Summary (Last 24 hours) at 05/31/15 1011 Last data filed at 05/31/15 1007  Gross per 24 hour  Intake   2250 ml  Output   1975 ml  Net    275 ml   Filed Weights   05/29/15 0931 05/29/15 2104 05/30/15 0500  Weight: 86.183 kg (190 lb) 88.7 kg (195 lb 8.8 oz) 90.5 kg (199 lb 8.3 oz)    Exam  General: Well developed, well nourished, NAD  HEENT: NCAT,mucous membranes moist.   Cardiovascular: S1 S2 auscultated, no murmurs, RRR  Respiratory: Clear to auscultation bilaterally   Abdomen: Soft, mild  RUQ TTP, mildly distended, + bowel sounds  Extremities: warm dry without cyanosis clubbing or edema. RLE in short leg cast  Neuro: AAOx3, nonfocal  Skin: Without rashes exudates or nodules. Small abrasion on top of buttocks  Psych: Appropriate  mood and affect   Data Reviewed: I have personally reviewed following labs and imaging studies  CBC:  Recent Labs Lab 05/24/15 2315 05/29/15 1048 05/30/15 0215 05/31/15 0520  WBC 5.3 36.6* 25.7* 17.1*  NEUTROABS  --  33.6*  --   --   HGB 12.1 14.0 12.6 10.8*  HCT 35.3* 40.6 38.1 32.6*  MCV 91.2 83.7 85.6 85.8  PLT 219 198 175 462   Basic Metabolic Panel:  Recent Labs Lab 05/24/15 2315 05/25/15 0235 05/29/15 1048 05/29/15 2257 05/30/15 0215 05/30/15 0528 05/30/15 1301 05/30/15 1710 05/30/15 2237 05/31/15 0120  NA 131* 132* 133* 136 137  --   --   --   --   --   K 5.4* 5.2* 5.8* 5.4* 5.0 5.0 5.2* 4.5 4.5 4.6  CL 103 104 101 105 109  --   --   --   --   --   CO2 18* 18* 15* 16* 17*  --   --   --   --   --   GLUCOSE 127* 115* 84 70 108*  --   --   --   --   --   BUN 57* 54* 93* 71* 65*  --   --   --   --   --   CREATININE 1.49* 1.36* 4.03* 2.48* 2.22*  --   --   --   --   --   CALCIUM 8.4* 8.3* 8.2* 8.3* 8.1*  --   --   --   --   --     GFR: Estimated Creatinine Clearance: 30.2 mL/min (by C-G formula based on Cr of 2.22). Liver Function Tests:  Recent Labs Lab 05/24/15 2315 05/29/15 1048 05/29/15 2257 05/30/15 0215  AST _0 ALT 20 QUANTITY NOT SUFFICIENT, UNABLE TO PERFORM TEST 20 18  ALKPHOS 83 89 100 89  BILITOT 0.5 QUANTITY NOT SUFFICIENT, UNABLE TO PERFORM TEST 1.0 0.9  PROT 5.8* 6.1* 5.9* 5.2*  ALBUMIN 2.7* 2.4* 2.2* 1.9*    Recent Labs Lab 05/24/15 2315 05/29/15 1048  LIPASE 25 20   No results for input(s): AMMONIA in the last 168 hours. Coagulation Profile:  Recent Labs Lab 05/29/15 2257  INR 1.55*   Cardiac Enzymes: No results for input(s): CKTOTAL, CKMB, CKMBINDEX, TROPONINI in the last 168 hours. BNP (last 3 results) No results for input(s): PROBNP in the last 8760 hours. HbA1C: No results for input(s): HGBA1C in the last 72 hours. CBG: No results for input(s): GLUCAP in the last 168 hours. Lipid Profile: No results for input(s): CHOL, HDL, LDLCALC, TRIG, CHOLHDL, LDLDIRECT in the last 72 hours. Thyroid Function Tests: No results for input(s): TSH, T4TOTAL, FREET4, T3FREE, THYROIDAB in the last 72 hours. Anemia Panel: No results for input(s): VITAMINB12, FOLATE, FERRITIN, TIBC, IRON, RETICCTPCT in the last 72 hours. Urine analysis:    Component Value Date/Time   COLORURINE AMBER* 05/29/2015 1256   APPEARANCEUR CLOUDY* 05/29/2015 1256   LABSPEC 1.020 05/29/2015 1256   PHURINE 5.0 05/29/2015 1256   GLUCOSEU NEGATIVE 05/29/2015 1256   HGBUR MODERATE* 05/29/2015 1256   BILIRUBINUR LARGE* 05/29/2015 1256   KETONESUR 15* 05/29/2015 1256   PROTEINUR 30* 05/29/2015 1256   UROBILINOGEN 2.0* 10/28/2014 0141   NITRITE NEGATIVE 05/29/2015 1256   LEUKOCYTESUR MODERATE* 05/29/2015 1256   Sepsis Labs: _1 (procalcitonin:4,lacticidven:4)  ) Recent Results (from the past 240 hour(s))  Blood culture (routine x 2)     Status: None (Preliminary result)   Collection Time:  05/29/15 12:40 PM  Result Value Ref Range Status   Specimen Description BLOOD LEFT ARM  Final   Special Requests BOTTLES DRAWN AEROBIC AND ANAEROBIC 5CC  Final   Culture NO GROWTH 2 DAYS  Final   Report Status PENDING  Incomplete  Blood culture (routine x 2)     Status: None (Preliminary result)   Collection Time: 05/29/15 12:48 PM  Result Value Ref Range Status   Specimen Description BLOOD LEFT ARM IV START  Final   Special Requests BOTTLES DRAWN AEROBIC AND ANAEROBIC 5CC  Final   Culture NO GROWTH 2 DAYS  Final   Report Status PENDING  Incomplete  Urine culture     Status: Abnormal   Collection Time: 05/29/15 12:56 PM  Result Value Ref Range Status   Specimen Description URINE, CATHETERIZED  Final   Special Requests NONE  Final   Culture >=100,000 COLONIES/mL ESCHERICHIA COLI (A)  Final   Report Status 05/31/2015 FINAL  Final   Organism ID, Bacteria ESCHERICHIA COLI (A)  Final      Susceptibility   Escherichia coli - MIC*    AMPICILLIN >=32 RESISTANT Resistant     CEFAZOLIN <=4 SENSITIVE Sensitive     CEFTRIAXONE <=1 SENSITIVE Sensitive     CIPROFLOXACIN >=4 RESISTANT Resistant     GENTAMICIN <=1 SENSITIVE Sensitive     IMIPENEM <=0.25 SENSITIVE Sensitive     NITROFURANTOIN <=16 SENSITIVE Sensitive     TRIMETH/SULFA >=320 RESISTANT Resistant     AMPICILLIN/SULBACTAM 16 INTERMEDIATE Intermediate     PIP/TAZO <=4 SENSITIVE Sensitive     * >=100,000 COLONIES/mL ESCHERICHIA COLI  MRSA PCR Screening     Status: None   Collection Time: 05/29/15  9:19 PM  Result Value Ref Range Status   MRSA by PCR NEGATIVE NEGATIVE Final    Comment:        The GeneXpert MRSA Assay (FDA approved for NASAL specimens only), is one component of a comprehensive MRSA colonization surveillance program. It is not intended to diagnose MRSA infection nor to guide or monitor treatment for MRSA infections.       Radiology Studies: Ct Abdomen Pelvis Wo Contrast  05/29/2015  CLINICAL DATA:   Constipation for past week. Diffuse abdomen pain with nausea and vomiting. Recent fall EXAM: CT ABDOMEN AND PELVIS WITHOUT CONTRAST TECHNIQUE: Multidetector CT imaging of the abdomen and pelvis was performed following the standard protocol without oral or intravenous contrast material administration. COMPARISON:  February 14, 2015 FINDINGS: Lower chest: There is patchy bibasilar lung atelectatic change. There is cardiomegaly with a small pericardial effusion. There is a hiatal hernia with esophageal dilatation and fluid throughout the visualized esophagus. There are scattered foci of coronary artery calcification. Hepatobiliary: No focal liver lesions are identified on this noncontrast enhanced study. There is no demonstrable perihepatic fluid. Gallbladder wall is not appreciably thickened. There is no biliary duct dilatation. Pancreas: No pancreatic mass or inflammatory focus. There is fatty infiltration in the pancreas. Spleen: No splenic lesions are identified.  No perisplenic fluid. Adrenals/Urinary Tract: Adrenals appear normal bilaterally. There is a parapelvic regions cyst in the mid to upper left kidney measuring 1.4 x 1.4 cm. There is no hydronephrosis on either side. There is no renal or ureteral calculus on either side. The urinary bladder is midline with wall thickness within normal limits. Stomach/Bowel: There is moderate stool throughout the colon. There are multiple loops of dilated colon without thickened colonic wall. The transverse colon measures 7.6 cm in diameter. The ascending colon  measures 8.4 cm in diameter. The: Contour and nares rather abruptly at the level of the splenic flexure. A focal mass is not evident in this area by CT. There is no small bowel dilatation or wall thickening. No mesenteric thickening. No small bowel obstruction. No free air or portal venous air. Vascular/Lymphatic: There is atherosclerotic calcification in the aorta. No abdominal aortic aneurysm. Calcification is also  noted in the common iliac arteries bilaterally. There is no obvious mesenteric vessel obstruction or high-grade stenosis. There is no adenopathy in the abdomen or pelvis. Reproductive: Uterus is absent. There is no pelvic mass or pelvic fluid collection. Other: Appendix appears normal. There is no ascites or abscess in the abdomen or pelvis. There is fat in the right inguinal ring. Musculoskeletal: There is lower lumbar levoscoliosis. There is degenerative change in the lumbar spine. There are no appreciable blastic or lytic bone lesions. There is no intramuscular lesion. There are surgical clips along the anterior abdominal wall at the rectus muscles level. IMPRESSION: Relative dilatation of the colon to the level of the splenic flexure, where there is a rather abrupt contour change. By CT, there is no mass in this area. This finding potentially could indicate focal scarring or possibly residua of old ischemic episode focally in this area. It may be prudent to consider direct visualization of this area given this abrupt contour change focally at the splenic flexure level. There is moderate stool in the colon proximal to the splenic flexure. Only modest stool is seen distal to this area. No small bowel dilatation. No small bowel wall thickening is seen on this study. No free air. There is a hiatal hernia with dilatation of the visualized distal esophagus with extensive fluid in the visualized esophagus. Question chronic reflux change. Direct visualization of the esophagus may be warranted to assess for possible Barrett's esophagus given this finding. Small pericardial effusion. Scattered foci of coronary artery calcification noted. Appendix appears normal.  No abscess.  Uterus absent. No renal or ureteral calculus.  No hydronephrosis. Electronically Signed   By: Lowella Grip III M.D.   On: 05/29/2015 11:27   Dg Shoulder Right  05/30/2015  CLINICAL DATA:  Fall at nursing facility today. Right shoulder pain.  EXAM: RIGHT SHOULDER - 2+ VIEW COMPARISON:  None. FINDINGS: No fracture. No dislocation. No significant arthropathic change. No bone lesion. Soft tissues are unremarkable. IMPRESSION: Negative. Electronically Signed   By: Lajean Manes M.D.   On: 05/30/2015 14:09   Ct Head Wo Contrast  05/29/2015  CLINICAL DATA:  60 year old female who fell out of bed this morning. Nausea vomiting, altered mental status, pain. Initial encounter. EXAM: CT HEAD WITHOUT CONTRAST CT CERVICAL SPINE WITHOUT CONTRAST TECHNIQUE: Multidetector CT imaging of the head and cervical spine was performed following the standard protocol without intravenous contrast. Multiplanar CT image reconstructions of the cervical spine were also generated. COMPARISON:  Cervical spine CT 05/12/2015 and earlier. Head CT 02/14/2015 and earlier. FINDINGS: CT HEAD FINDINGS No scalp hematoma. Chronic left forehead scalp soft tissue scarring. Visualized orbit soft tissues are within normal limits. There might be a chronic left orbital floor fracture. Visualized paranasal sinuses and mastoids are clear. Calvarium intact. Extensive Calcified atherosclerosis at the skull base. Cerebral volume is normal. No midline shift, ventriculomegaly, mass effect, evidence of mass lesion, intracranial hemorrhage or evidence of cortically based acute infarction. Gray-white matter differentiation is within normal limits throughout the brain. No suspicious intracranial vascular hyperdensity. CT CERVICAL SPINE FINDINGS The patient is status post ACDF  since February. C5-C6 and C6-C7 ACDF hardware remains in place and appear stable. Stable cervical vertebral height and alignment with straightening of lordosis. Cervicothoracic junction alignment is within normal limits. Bilateral posterior element alignment is within normal limits. Visualized skull base is intact. No atlanto-occipital dissociation. Chronic cervical disc and endplate degeneration at C3-C4 and C4-C5. No acute cervical  spine fracture. Visualized upper thoracic levels appear grossly intact. Centrilobular and paraseptal emphysema in the lung apices. Negative noncontrast paraspinal soft tissues. The proximal esophagus is distended with air in fluid today (series 302, image 73). Visualized superior mediastinum otherwise stable and negative. IMPRESSION: 1. Stable and Normal noncontrast CT appearance of the brain. 2. No acute fracture or listhesis identified in the cervical spine. Ligamentous injury is not excluded. 3. Stable C5-C6 and C6-C7 ACDF. 4. Dilated, fluid-filled proximal esophagus today. Etiology and significance unclear. See CT Abdomen and Pelvis reported separately. 5. Pulmonary emphysema. Electronically Signed   By: Genevie Ann M.D.   On: 05/29/2015 11:21   Ct Cervical Spine Wo Contrast  05/29/2015  CLINICAL DATA:  60 year old female who fell out of bed this morning. Nausea vomiting, altered mental status, pain. Initial encounter. EXAM: CT HEAD WITHOUT CONTRAST CT CERVICAL SPINE WITHOUT CONTRAST TECHNIQUE: Multidetector CT imaging of the head and cervical spine was performed following the standard protocol without intravenous contrast. Multiplanar CT image reconstructions of the cervical spine were also generated. COMPARISON:  Cervical spine CT 05/12/2015 and earlier. Head CT 02/14/2015 and earlier. FINDINGS: CT HEAD FINDINGS No scalp hematoma. Chronic left forehead scalp soft tissue scarring. Visualized orbit soft tissues are within normal limits. There might be a chronic left orbital floor fracture. Visualized paranasal sinuses and mastoids are clear. Calvarium intact. Extensive Calcified atherosclerosis at the skull base. Cerebral volume is normal. No midline shift, ventriculomegaly, mass effect, evidence of mass lesion, intracranial hemorrhage or evidence of cortically based acute infarction. Gray-white matter differentiation is within normal limits throughout the brain. No suspicious intracranial vascular hyperdensity.  CT CERVICAL SPINE FINDINGS The patient is status post ACDF since February. C5-C6 and C6-C7 ACDF hardware remains in place and appear stable. Stable cervical vertebral height and alignment with straightening of lordosis. Cervicothoracic junction alignment is within normal limits. Bilateral posterior element alignment is within normal limits. Visualized skull base is intact. No atlanto-occipital dissociation. Chronic cervical disc and endplate degeneration at C3-C4 and C4-C5. No acute cervical spine fracture. Visualized upper thoracic levels appear grossly intact. Centrilobular and paraseptal emphysema in the lung apices. Negative noncontrast paraspinal soft tissues. The proximal esophagus is distended with air in fluid today (series 302, image 73). Visualized superior mediastinum otherwise stable and negative. IMPRESSION: 1. Stable and Normal noncontrast CT appearance of the brain. 2. No acute fracture or listhesis identified in the cervical spine. Ligamentous injury is not excluded. 3. Stable C5-C6 and C6-C7 ACDF. 4. Dilated, fluid-filled proximal esophagus today. Etiology and significance unclear. See CT Abdomen and Pelvis reported separately. 5. Pulmonary emphysema. Electronically Signed   By: Genevie Ann M.D.   On: 05/29/2015 11:21   Dg Chest Portable 1 View  05/29/2015  CLINICAL DATA:  60 year old female with altered mental status. Recent chest pain. Initial encounter. EXAM: PORTABLE CHEST 1 VIEW COMPARISON:  05/25/2015 and earlier. FINDINGS: Portable AP semi upright view at at 1233 hours. Lower lung volumes. Mildly increased pulmonary vascularity without overt edema. Stable cardiac size and mediastinal contours. No pneumothorax. No pleural effusion or confluent pulmonary opacity identified. Cervical ACDF hardware re- demonstrated. IMPRESSION: Lower lung volumes with increased pulmonary  vascularity but no acute pulmonary edema. Electronically Signed   By: Genevie Ann M.D.   On: 05/29/2015 13:05     Scheduled  Meds: . atorvastatin  40 mg Oral q1800  . budesonide (PULMICORT) nebulizer solution  0.25 mg Nebulization BID  . cefTAZidime (FORTAZ)  IV  2 g Intravenous Q24H  . hydrocortisone sod succinate (SOLU-CORTEF) inj  50 mg Intravenous Daily  . metoCLOPramide  2.5 mg Oral TID AC & HS  . pantoprazole  40 mg Oral Daily  . polyethylene glycol  17 g Oral QHS  . sodium chloride flush  10-40 mL Intracatheter Q12H   Continuous Infusions: . sodium chloride 125 mL/hr at 05/30/15 1250     LOS: 2 days   Time Spent in minutes   30 minutes  Steffani Dionisio D.O. on 05/31/2015 at 10:11 AM  Between 7am to 7pm - Pager - [phone]  After 7pm go to [email or web address] - password [email or web address]  And look for the night coverage person covering for me after hours  [facility] Office  [phone]

## 2015-05-31 NOTE — Progress Notes (Signed)
Pharmacy Antibiotic Note  [name] is a 60 y.o. female admitted on 05/29/2015 with sepsis.  Pt on Vancomycin Ceftazidime (total abx Day #3).  The patient inadvertently was loaded with Vancomycin 3g on 5/18 (1g by EDP + 1g by Rx - which was intentional to achieve a 2g LD, followed by 1g that was defaulted on the admitting orderset). A SZP for submitted for this incident. Currently holding doses. Random vancomycin level 31 mcg/ml (remains supratherapeutic) about 24 hours post 3gm load.  The patient was noted to have AoCKD on admit - resolving, SCr 2.22 << 4.03, CrCl~30 ml/min.   Plan: Continue Ceftazidime 2g IV every 24 hours Continue to hold Vancomycin for now Will get random level 24 hours from now and calculate kinetics in order to redose   Height: 5\' 6"  (167.6 cm) Weight: 199 lb 8.3 oz (90.5 kg) IBW/kg (Calculated) : 59.3  Temp (24hrs), Avg:98.2 F (36.8 C), Min:97.6 F (36.4 C), Max:98.8 F (37.1 C)   Recent Labs Lab 05/24/15 2315 05/25/15 0235 05/29/15 1048 05/29/15 1152 05/29/15 1437 05/29/15 2257 05/30/15 0215 05/30/15 0711 05/30/15 2237  WBC 5.3  --  36.6*  --   --   --  25.7*  --   --   CREATININE 1.49* 1.36* 4.03*  --   --  2.48* 2.22*  --   --   LATICACIDVEN  --   --   --  2.17* 2.44* 2.8* 2.6* 1.9  --   VANCORANDOM  --   --   --   --   --   --   --   --  31    Estimated Creatinine Clearance: 30.2 mL/min (by C-G formula based on Cr of 2.22).    Allergies  Allergen Reactions  . Iohexol Anaphylaxis, Swelling and Other (See Comments)      Throat swelling    . Levofloxacin In D5w Other (See Comments)    Has baseline prolonged QTc.  Avoid QTc prolonging medications.  Tresa Moore Hcl] Other (See Comments)    Has baseline prolonged QTc.  Avoid QTc prolonging medications.  . 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 Penicillins Itching    Has patient had a PCN reaction causing immediate rash, facial/tongue/throat swelling, SOB or lightheadedness with hypotension: yes Has patient had a PCN reaction causing severe rash involving mucus membranes or skin necrosis: unknown Has patient had a PCN reaction that required hospitalization : no, reacted while in the Drs office Has patient had a PCN reaction occurring within the last 10 years: yes If all of the above answers are "NO", then may proceed with Cephalosporin use.     Antimicrobials this admission: Azactam 5/18 >> 5/19 Vanc 5/18 >>  Ceftazidime 5/19 >>  Dose adjustments this admission: n/a  Microbiology results: 5/18 MRSA PCR >> neg 5/18 UCx >> 5/18 BCx >>  Thank you for allowing pharmacy to be a part of this patient's care.  Sherlon Handing, PharmD, BCPS Clinical pharmacist, pager [phone] 05/31/2015 12:30 AM
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
## 2015-06-01 LAB — BASIC METABOLIC PANEL
ANION GAP: 12 (ref 5–15)
BUN: 37 mg/dL — ABNORMAL HIGH (ref 6–20)
CALCIUM: 8.1 mg/dL — AB (ref 8.9–10.3)
CHLORIDE: 114 mmol/L — AB (ref 101–111)
CO2: 15 mmol/L — AB (ref 22–32)
Creatinine, Ser: 1.28 mg/dL — ABNORMAL HIGH (ref 0.44–1.00)
GFR calc non Af Amer: 44 mL/min — ABNORMAL LOW (ref >60)
GFR, EST AFRICAN AMERICAN: 51 mL/min — AB (ref >60)
Glucose, Bld: 98 mg/dL (ref 65–99)
POTASSIUM: 3.8 mmol/L (ref 3.5–5.1)
Sodium: 141 mmol/L (ref 135–145)

## 2015-06-01 LAB — CBC
HEMATOCRIT: 31.2 % — AB (ref 36.0–46.0)
HEMOGLOBIN: 10.6 g/dL — AB (ref 12.0–15.0)
MCH: 28.3 pg (ref 26.0–34.0)
MCHC: 34 g/dL (ref 30.0–36.0)
MCV: 83.4 fL (ref 78.0–100.0)
Platelets: 201 10*3/uL (ref 150–400)
RBC: 3.74 MIL/uL — AB (ref 3.87–5.11)
RDW: 15.1 % (ref 11.5–15.5)
WBC: 16.4 10*3/uL — AB (ref 4.0–10.5)

## 2015-06-01 LAB — MAGNESIUM: Magnesium: 2 mg/dL (ref 1.7–2.4)

## 2015-06-01 MED ORDER — CALCIUM CARBONATE ANTACID 500 MG PO CHEW: 1.0000 | CHEWABLE_TABLET | Freq: Two times a day (BID) | ORAL | Status: DC

## 2015-06-01 MED ORDER — CALCIUM CARBONATE ANTACID 500 MG PO CHEW
1.0000 | CHEWABLE_TABLET | Freq: Four times a day (QID) | ORAL | Status: DC | PRN
  Administered 2015-06-01 (×2): 200 mg via ORAL
  Filled 2015-06-01 (×2): qty 1

## 2015-06-01 MED ORDER — TRAMADOL HCL 50 MG PO TABS
50.0000 mg | ORAL_TABLET | Freq: Four times a day (QID) | ORAL | Status: DC | PRN
  Administered 2015-06-01 – 2015-06-02 (×3): 50 mg via ORAL
  Filled 2015-06-01 (×3): qty 1

## 2015-06-01 MED ORDER — AMLODIPINE BESYLATE 5 MG PO TABS
5.0000 mg | ORAL_TABLET | Freq: Every day | ORAL | Status: DC
  Administered 2015-06-01 – 2015-06-02 (×2): 5 mg via ORAL
  Filled 2015-06-01 (×2): qty 1

## 2015-06-01 MED ORDER — CEFTRIAXONE SODIUM 1 G IJ SOLR
1.0000 g | INTRAMUSCULAR | Status: DC
  Administered 2015-06-01: 1 g via INTRAVENOUS
  Filled 2015-06-01 (×2): qty 10

## 2015-06-01 MED ORDER — HYDROCORTISONE NA SUCCINATE PF 100 MG IJ SOLR
25.0000 mg | Freq: Every day | INTRAMUSCULAR | Status: DC
  Administered 2015-06-02: 25 mg via INTRAVENOUS
  Filled 2015-06-01: qty 2

## 2015-06-01 NOTE — Care Management Note (Signed)
Case Management Note  Patient Details  Name: [name] MRN: [id number] Date of Birth: 1955-06-29  Subjective/Objective:                 Admitted s/p fall with severe sepsis/UTI,  history of COPD, essential hypertension, chronic kidney disease, cervical spine stenosis, s/p ACDF. R lateral malleolus nondisplaced ankle fracture,short leg cast in place. Recently d/c form hospital 05/16/2015 to SNF/Rehab..   Action/Plan: Awaiting PT evaluation  ? return to SNF/Rehab vs home with hh services when medically stable. CM to f/u with disposition needs.  Expected Discharge Date:                  Expected Discharge Plan:  [place] ([place] [facility]  In-House Referral:  Clinical Social Work  Discharge planning Services  CM Consult  Post Acute Care Choice:    Choice offered to:     DME Arranged:    DME Agency:     HH Arranged:    [place] Agency:     Status of Service:  In process, will continue to follow  Medicare Important Message Given:    Date Medicare IM Given:    Medicare IM give by:    Date Additional Medicare IM Given:    Additional Medicare Important Message give by:     If discussed at [place] of Stay Meetings, dates discussed:    Additional Comments: [name] (Daughter) [phone], [name] (Sister)  [phone]  [facility], [place] [phone] 06/01/2015, 1:05 PM

## 2015-06-01 NOTE — Progress Notes (Addendum)
PROGRESS NOTE    [name]  [id number] DOB: 21-Dec-1955 DOA: 05/29/2015 PCP: Philis Fendt, MD   Brief Narrative:  On 05/29/2015 [name] is a 60 y.o. female with a medical history of COPD, essential hypertension, chronic kidney disease, cervical spine stenosis, but present to the emergency department after falling at the nursing facility if this today and complaining of right shoulder pain. Patient was given narcotic medications prior to arrival. She is currently somnolent, but arousable. Information in this history and physical were obtained however from EDP and prior records. Currently patient denies any chest pain, shortness of breath, headache, dizziness, back or neck pain. She does complain of abdominal pain. She was recently seen in emergency department a few days ago for nausea and vomiting. Patient does have a short leg cast on her right lower extremity however cannot tell me when this was placed. She cannot tell me he was been following her for this. [facility] called for admission.  Assessment & Plan   Severe sepsis likely secondary to UTI -Upon admission, patient leukocytosis of 36.6, lactic acid 2.44, hypotensive, acute kidney injury -Lactic acid and WBC improving -UA shows the PVCs 6-30, many bacteria, moderate leukocytes, calcium oxalate crystals -Urine culture >100K Ecoli -Blood cultures show no growth to date -CT abdomen and pelvis showed relative dilatation of the colon to the level of the splenic flexure where there is an abrupt contour change. No CT in this area, potentially could indicate focal scarring or residual of old ischemic episode -Was placed on vanc and aztreonam (given PCN allergy and MRSA) -Transitioned to ceftriaxone  Acute on chronic kidney injury, stage III/ metabolic acidosis -Baseline creatinine approximately 1.2-1.4, was 4.03 upon admission -Likely secondary to sepsis -Will hold nephrotoxic agents (ACE inhibitor) -Creatinine trending downward,  currently 1.28  -Continue on IV fluids and continue to monitor BMP  Hyperkalemia -Resolved, Likely secondary to acute kidney injury -Continue to monitor  Abdominal pain -Question whether this is related to CT findings as mentioned above versus constipation -Improving after bowel regimen started, will continue -AST and lipase, alk phosphatase within normal limits (ALT and bilirubin not calculated)  Leukocytosis -Improving, Most likely secondary to sepsis however patient was on Decadron -Will continue to monitor CBC along with the above treatment and plan  Somnolence -Resolved, Likely secondary to narcotics -Currently patient has no neurological deficits -CT head: Normal CT appearance of the brain, no acute fractures  COPD -Currently stable, no active wheezing -Continue home medications  Essential hypertension -Patient  was hypotensive, and placed on IV fluids -Hold amlodipine, lisinopril -Placed on solucortef for possible AI -Will restart home amlodipine, discontinue IVF and taper solucortef  Hyperlipidemia -Continue statin  Cervical stenosis -Status post ACDF with Dr. Kathyrn Sheriff, neurosurgery -Patient was recently admitted and discharged on 05/16/2015 -Spoke with Dr. Kathyrn Sheriff via phone, can taper decadron  Right shoulder pain -Able to move RUE with ease -Shoulder xray negative  Right lateral malleolus nondisplaced ankle fracture -Patient does have short leg cast in place, done on 05/16/2015 -Patient is to follow with Dr. Lorin Mercy in 2-3 weeks. -Spoke with Dr. Lorin Mercy 5/19, follow up in the office after discharge  DVT Prophylaxis  SCDs  Code Status: Full  Family Communication: None at bedside  Disposition Plan: Admitted.  Possible discharge within 1-2 days. Pending PT  Consultants Dr. Kathyrn Sheriff, neurosurgery, via phone Dr. Lorin Mercy, ortho, via phone  Procedures  None  Antibiotics   Anti-infectives    Start     Dose/Rate Route Frequency Ordered Stop  05/31/15  1400  cefTRIAXone (ROCEPHIN) 2 g in dextrose 5 % 50 mL IVPB     2 g 100 mL/hr over 30 Minutes Intravenous Every 24 hours 05/31/15 1200     05/30/15 1400  aztreonam (AZACTAM) injection 1 g  Status:  Discontinued     1 g Intramuscular Every 8 hours 05/29/15 1746 05/29/15 2126   05/30/15 1400  cefTAZidime (FORTAZ) 2 g in dextrose 5 % 50 mL IVPB  Status:  Discontinued     2 g 100 mL/hr over 30 Minutes Intravenous Every 24 hours 05/30/15 0859 05/31/15 1159   05/30/15 0600  aztreonam (AZACTAM) 1 g in dextrose 5 % 50 mL IVPB  Status:  Discontinued     1 g 100 mL/hr over 30 Minutes Intravenous Every 8 hours 05/29/15 2127 05/30/15 0859   05/29/15 2130  aztreonam (AZACTAM) 2 g in dextrose 5 % 50 mL IVPB     2 g 100 mL/hr over 30 Minutes Intravenous  Once 05/29/15 2120 05/29/15 2255   05/29/15 2130  vancomycin (VANCOCIN) IVPB 1000 mg/200 mL premix     1,000 mg 200 mL/hr over 60 Minutes Intravenous  Once 05/29/15 2120 05/30/15 0130   05/29/15 1800  vancomycin (VANCOCIN) IVPB 1000 mg/200 mL premix     1,000 mg 200 mL/hr over 60 Minutes Intravenous STAT 05/29/15 1746 05/29/15 2011   05/29/15 1415  ceFEPIme (MAXIPIME) 2 g in dextrose 5 % 50 mL IVPB     2 g 100 mL/hr over 30 Minutes Intravenous  Once 05/29/15 1410 05/29/15 1443   05/29/15 1245  levofloxacin (LEVAQUIN) IVPB 750 mg  Status:  Discontinued     750 mg 100 mL/hr over 90 Minutes Intravenous  Once 05/29/15 1230 05/29/15 1410   05/29/15 1245  aztreonam (AZACTAM) 2 g in dextrose 5 % 50 mL IVPB  Status:  Discontinued     2 g 100 mL/hr over 30 Minutes Intravenous  Once 05/29/15 1230 05/29/15 1410   05/29/15 1245  vancomycin (VANCOCIN) IVPB 1000 mg/200 mL premix     1,000 mg 200 mL/hr over 60 Minutes Intravenous  Once 05/29/15 1230 05/29/15 1622      Subjective:   [name] seen and examined today.  Continues to complain of abdominal pain but feels it has improved. Denies nausea or vomiting, chest pain, shortness of breath, nausea or  vomiting, dizziness, headache.   Objective:   Filed Vitals:   05/31/15 2144 05/31/15 2159 06/01/15 0503 06/01/15 0726  BP: 141/83  151/77   Pulse: 92 88 92   Temp: 97.8 F (36.6 C)  97.6 F (36.4 C)   TempSrc:      Resp: 18 18 18    Height:      Weight:   90.583 kg (199 lb 11.2 oz)   SpO2: 96%  98% 99%    Intake/Output Summary (Last 24 hours) at 06/01/15 1254 Last data filed at 06/01/15 0902  Gross per 24 hour  Intake   2180 ml  Output    400 ml  Net   1780 ml   Filed Weights   05/29/15 2104 05/30/15 0500 06/01/15 0503  Weight: 88.7 kg (195 lb 8.8 oz) 90.5 kg (199 lb 8.3 oz) 90.583 kg (199 lb 11.2 oz)    Exam  General: Well developed, well nourished, no distress  HEENT: NCAT,mucous membranes moist.   Cardiovascular: S1 S2 auscultated, no murmurs, RRR  Respiratory: Clear to auscultation bilaterally   Abdomen: Soft, mild  RUQ TTP, +distended, + bowel  sounds  Extremities: warm dry without cyanosis clubbing or edema. RLE in short leg cast  Neuro: AAOx3, nonfocal  Psych: Appropriate mood and affect   Data Reviewed: I have personally reviewed following labs and imaging studies  CBC:  Recent Labs Lab 05/29/15 1048 05/30/15 0215 05/31/15 0520 06/01/15 0537  WBC 36.6* 25.7* 17.1* 16.4*  NEUTROABS 33.6*  --   --   --   HGB 14.0 12.6 10.8* 10.6*  HCT 40.6 38.1 32.6* 31.2*  MCV 83.7 85.6 85.8 83.4  PLT 198 175 211 633   Basic Metabolic Panel:  Recent Labs Lab 05/29/15 1048 05/29/15 2257 05/30/15 0215  05/30/15 2237 05/31/15 0120 05/31/15 1128 05/31/15 1331 06/01/15 0537  NA 133* 136 137  --   --   --  143  --  141  K 5.8* 5.4* 5.0  < > 4.5 4.6 6.6* 4.6 3.8  CL 101 105 109  --   --   --  122*  --  114*  CO2 15* 16* 17*  --   --   --  10*  --  15*  GLUCOSE 84 70 108*  --   --   --  66  --  98  BUN 93* 71* 65*  --   --   --  44*  --  37*  CREATININE 4.03* 2.48* 2.22*  --   --   --  1.36*  --  1.28*  CALCIUM 8.2* 8.3* 8.1*  --   --   --  8.0*  --   8.1*  MG  --   --   --   --   --   --   --   --  2.0  < > = values in this interval not displayed. GFR: Estimated Creatinine Clearance: 52.3 mL/min (by C-G formula based on Cr of 1.28). Liver Function Tests:  Recent Labs Lab 05/29/15 1048 05/29/15 2257 05/30/15 0215  AST 23 24 18   ALT QUANTITY NOT SUFFICIENT, UNABLE TO PERFORM TEST 20 18  ALKPHOS 89 100 89  BILITOT QUANTITY NOT SUFFICIENT, UNABLE TO PERFORM TEST 1.0 0.9  PROT 6.1* 5.9* 5.2*  ALBUMIN 2.4* 2.2* 1.9*    Recent Labs Lab 05/29/15 1048  LIPASE 20   No results for input(s): AMMONIA in the last 168 hours. Coagulation Profile:  Recent Labs Lab 05/29/15 2257  INR 1.55*   Cardiac Enzymes: No results for input(s): CKTOTAL, CKMB, CKMBINDEX, TROPONINI in the last 168 hours. BNP (last 3 results) No results for input(s): PROBNP in the last 8760 hours. HbA1C: No results for input(s): HGBA1C in the last 72 hours. CBG: No results for input(s): GLUCAP in the last 168 hours. Lipid Profile: No results for input(s): CHOL, HDL, LDLCALC, TRIG, CHOLHDL, LDLDIRECT in the last 72 hours. Thyroid Function Tests: No results for input(s): TSH, T4TOTAL, FREET4, T3FREE, THYROIDAB in the last 72 hours. Anemia Panel: No results for input(s): VITAMINB12, FOLATE, FERRITIN, TIBC, IRON, RETICCTPCT in the last 72 hours. Urine analysis:    Component Value Date/Time   COLORURINE AMBER* 05/29/2015 1256   APPEARANCEUR CLOUDY* 05/29/2015 1256   LABSPEC 1.020 05/29/2015 1256   PHURINE 5.0 05/29/2015 1256   GLUCOSEU NEGATIVE 05/29/2015 1256   HGBUR MODERATE* 05/29/2015 1256   BILIRUBINUR LARGE* 05/29/2015 1256   KETONESUR 15* 05/29/2015 1256   PROTEINUR 30* 05/29/2015 1256   UROBILINOGEN 2.0* 10/28/2014 0141   NITRITE NEGATIVE 05/29/2015 1256   LEUKOCYTESUR MODERATE* 05/29/2015 1256   Sepsis Labs: @LABRCNTIP (procalcitonin:4,lacticidven:4)  ) Recent Results (  from the past 240 hour(s))  Blood culture (routine x 2)     Status: None  (Preliminary result)   Collection Time: 05/29/15 12:40 PM  Result Value Ref Range Status   Specimen Description BLOOD LEFT ARM  Final   Special Requests BOTTLES DRAWN AEROBIC AND ANAEROBIC 5CC  Final   Culture NO GROWTH 3 DAYS  Final   Report Status PENDING  Incomplete  Blood culture (routine x 2)     Status: None (Preliminary result)   Collection Time: 05/29/15 12:48 PM  Result Value Ref Range Status   Specimen Description BLOOD LEFT ARM IV START  Final   Special Requests BOTTLES DRAWN AEROBIC AND ANAEROBIC 5CC  Final   Culture NO GROWTH 3 DAYS  Final   Report Status PENDING  Incomplete  Urine culture     Status: Abnormal   Collection Time: 05/29/15 12:56 PM  Result Value Ref Range Status   Specimen Description URINE, CATHETERIZED  Final   Special Requests NONE  Final   Culture >=100,000 COLONIES/mL ESCHERICHIA COLI (A)  Final   Report Status 05/31/2015 FINAL  Final   Organism ID, Bacteria ESCHERICHIA COLI (A)  Final      Susceptibility   Escherichia coli - MIC*    AMPICILLIN >=32 RESISTANT Resistant     CEFAZOLIN <=4 SENSITIVE Sensitive     CEFTRIAXONE <=1 SENSITIVE Sensitive     CIPROFLOXACIN >=4 RESISTANT Resistant     GENTAMICIN <=1 SENSITIVE Sensitive     IMIPENEM <=0.25 SENSITIVE Sensitive     NITROFURANTOIN <=16 SENSITIVE Sensitive     TRIMETH/SULFA >=320 RESISTANT Resistant     AMPICILLIN/SULBACTAM 16 INTERMEDIATE Intermediate     PIP/TAZO <=4 SENSITIVE Sensitive     * >=100,000 COLONIES/mL ESCHERICHIA COLI  MRSA PCR Screening     Status: None   Collection Time: 05/29/15  9:19 PM  Result Value Ref Range Status   MRSA by PCR NEGATIVE NEGATIVE Final    Comment:        The GeneXpert MRSA Assay (FDA approved for NASAL specimens only), is one component of a comprehensive MRSA colonization surveillance program. It is not intended to diagnose MRSA infection nor to guide or monitor treatment for MRSA infections.       Radiology Studies: Dg Shoulder  Right  05/30/2015  CLINICAL DATA:  Fall at nursing facility today. Right shoulder pain. EXAM: RIGHT SHOULDER - 2+ VIEW COMPARISON:  None. FINDINGS: No fracture. No dislocation. No significant arthropathic change. No bone lesion. Soft tissues are unremarkable. IMPRESSION: Negative. Electronically Signed   By: Lajean Manes M.D.   On: 05/30/2015 14:09     Scheduled Meds: . atorvastatin  40 mg Oral q1800  . budesonide (PULMICORT) nebulizer solution  0.25 mg Nebulization BID  . cefTRIAXone (ROCEPHIN)  IV  2 g Intravenous Q24H  . hydrocortisone sod succinate (SOLU-CORTEF) inj  50 mg Intravenous Daily  . metoCLOPramide  2.5 mg Oral TID AC & HS  . pantoprazole  40 mg Oral Daily  . polyethylene glycol  17 g Oral QHS  . sodium chloride flush  10-40 mL Intracatheter Q12H   Continuous Infusions: . sodium chloride 125 mL/hr at 06/01/15 0047     LOS: 3 days   Time Spent in minutes   30 minutes  Taleen Prosser D.O. on 06/01/2015 at 12:54 PM  Between 7am to 7pm - Pager - [phone]  After 7pm go to [email or web address] - password [email or web address]  And look for the night coverage person covering for me  after hours  [employer]  [phone]

## 2015-06-02 LAB — BASIC METABOLIC PANEL
ANION GAP: 6 (ref 5–15)
BUN: 26 mg/dL — ABNORMAL HIGH (ref 6–20)
CALCIUM: 8.3 mg/dL — AB (ref 8.9–10.3)
CO2: 19 mmol/L — ABNORMAL LOW (ref 22–32)
CREATININE: 1.13 mg/dL — AB (ref 0.44–1.00)
Chloride: 117 mmol/L — ABNORMAL HIGH (ref 101–111)
GFR, EST AFRICAN AMERICAN: 60 mL/min — AB (ref >60)
GFR, EST NON AFRICAN AMERICAN: 51 mL/min — AB (ref >60)
Glucose, Bld: 102 mg/dL — ABNORMAL HIGH (ref 65–99)
Potassium: 3.5 mmol/L (ref 3.5–5.1)
Sodium: 142 mmol/L (ref 135–145)

## 2015-06-02 LAB — CBC
HEMATOCRIT: 34.1 % — AB (ref 36.0–46.0)
HEMOGLOBIN: 11.1 g/dL — AB (ref 12.0–15.0)
MCH: 28.5 pg (ref 26.0–34.0)
MCHC: 32.6 g/dL (ref 30.0–36.0)
MCV: 87.4 fL (ref 78.0–100.0)
Platelets: 259 10*3/uL (ref 150–400)
RBC: 3.9 MIL/uL (ref 3.87–5.11)
RDW: 17.1 % — AB (ref 11.5–15.5)
WBC: 12.6 10*3/uL — AB (ref 4.0–10.5)

## 2015-06-02 MED ORDER — TRAMADOL HCL 50 MG PO TABS: 50.0000 mg | ORAL_TABLET | Freq: Four times a day (QID) | ORAL | Status: DC | PRN

## 2015-06-02 NOTE — Discharge Instructions (Signed)

## 2015-06-02 NOTE — Progress Notes (Signed)
NURSING PROGRESS NOTE  Charee Vasseur [id number] Discharge Data: 06/02/2015 6:03 PM Attending Provider: Cristal Ford, DO [place]:[id number] Bunnie Domino, MD     [name] to be D/C'd Rehab, [facility],  per MD order.  Report given to nurse receiving pt. Discussed with the patient the After Visit Summary and all questions fully answered. All IV's discontinued with no bleeding noted. All belongings returned to patient for patient to take home.   Last Vital Signs:  Blood pressure 155/86, pulse 95, temperature 97.7 F (36.5 C), temperature source Oral, resp. rate 18, height 5\' 6"  (1.676 m), weight 89.495 kg (197 lb 4.8 oz), SpO2 100 %.  Discharge Medication List   Medication List    STOP taking these medications        dexamethasone 4 MG tablet  Commonly known as:  DECADRON     Oxycodone HCl 10 MG Tabs     sucralfate 1 GM/10ML suspension  Commonly known as:  CARAFATE      TAKE these medications        albuterol 108 (90 Base) MCG/ACT inhaler  Commonly known as:  PROVENTIL HFA;VENTOLIN HFA  Inhale 2 puffs into the lungs every 6 (six) hours as needed for wheezing or shortness of breath.     amitriptyline 50 MG tablet  Commonly known as:  ELAVIL  Take 50 mg by mouth at bedtime.     amLODipine 5 MG tablet  Commonly known as:  NORVASC  Take 5 mg by mouth daily.     atorvastatin 40 MG tablet  Commonly known as:  LIPITOR  Take 1 tablet (40 mg total) by mouth daily at 6 PM.     beclomethasone 80 MCG/ACT inhaler  Commonly known as:  QVAR  Inhale 2 puffs into the lungs 2 (two) times daily.     bismuth subsalicylate [id number] suspension  Commonly known as:  PEPTO BISMOL  Take 30 mLs by mouth every 6 (six) hours as needed.     lisinopril 20 MG tablet  Commonly known as:  PRINIVIL,ZESTRIL  Take 20 mg by mouth daily.     methocarbamol 500 MG tablet  Commonly known as:  ROBAXIN  Take 1 tablet (500 mg total) by mouth every 6 (six) hours as needed for muscle spasms.     metoCLOPramide 5 MG tablet  Commonly known as:  REGLAN  Take 2.5 mg by mouth 4 (four) times daily -  before meals and at bedtime.     oxybutynin 5 MG tablet  Commonly known as:  DITROPAN  Take 5 mg by mouth 2 (two) times daily.     pantoprazole 40 MG tablet  Commonly known as:  PROTONIX  Take 1 tablet (40 mg total) by mouth daily.     polyethylene glycol packet  Commonly known as:  MIRALAX / GLYCOLAX  Take 17 g by mouth daily.     promethazine 25 MG tablet  Commonly known as:  PHENERGAN  Take 1 tablet (25 mg total) by mouth every 6 (six) hours as needed for nausea or vomiting.     traMADol 50 MG tablet  Commonly known as:  ULTRAM  Take 1 tablet (50 mg total) by mouth every 6 (six) hours as needed for severe pain.

## 2015-06-02 NOTE — Clinical Social Work Note (Signed)
Clinical Social Work Assessment  Patient Details  Name: [name] MRN: [id number] Date of Birth: [birth year]  Date of referral:  06/02/15               Reason for consult:  Facility Placement                Permission sought to share information with:  Facility Sport and exercise psychologist, Family Supports Permission granted to share information::  Yes, Verbal Permission Granted  Name::     [name]  Agency::  [facility]  Relationship::  Sister  Contact Information:  [phone]  Housing/Transportation Living arrangements for the past 2 months:  [place], [place] of Information:  Patient Patient Interpreter Needed:  None Criminal Activity/Legal Involvement Pertinent to Current Situation/Hospitalization:  No - Comment as needed Significant Relationships:  Adult Children, Siblings Lives with:  Siblings Do you feel safe going back to the place where you live?  Yes Need for family participation in patient care:  Yes (Comment)  Care giving concerns:  CSW received referral for patient to return to SNF at discharge. CSW spoke with patient at bedside. Patient reported that plan was to discharge back to [facility].   Social Worker assessment / plan:  Patient to return to [facility] at Discharge by [place].  Employment status:  Disabled (Comment on whether or not currently receiving Disability) Insurance information:  Medicaid In [place] PT Recommendations:  Not assessed at this time Information / Referral to community resources:  [place]  Patient/Family's Response to care:  Patient recognizes need for rehab and is agreeable to returning to [facility]. Patient is reporting a lot of pain.  Patient/Family's Understanding of and Emotional Response to Diagnosis, Current Treatment, and Prognosis:  Patient is realistic regarding therapy needs. No questions/concerns about plan or treatment.    Emotional Assessment Appearance:  Appears stated  age Attitude/Demeanor/Rapport:   (Appropriate) Affect (typically observed):  Accepting, Appropriate Orientation:  Oriented to Self, Oriented to Place, Oriented to  Time, Oriented to Situation Alcohol / Substance use:  Not Applicable Psych involvement (Current and /or in the community):  No (Comment)  Discharge Needs  Concerns to be addressed:  Care Coordination Readmission within the last 30 days:  Yes Current discharge risk:  None Barriers to Discharge:  Continued Medical Work up   [employer], [place] 06/02/2015, 11:41 AM

## 2015-06-02 NOTE — Progress Notes (Signed)
PT Cancellation Note  Patient Details Name: [name] MRN: [id number] DOB: 1955/08/13   Cancelled Treatment:    Reason Eval/Treat Not Completed: Other (comment) pt with discharge orders and summary written awaiting transport back to [facility] SNF. Will defer PT services until return to SNF.    [name] A [name] 06/02/2015, 3:27 PM Wray [place], [place], DPT [phone]

## 2015-06-02 NOTE — Discharge Summary (Signed)
Physician Discharge Summary  Shakendra Griffeth YHC:[id number] DOB: 06-07-1955 DOA: 05/29/2015  PCP: Philis Fendt, MD  Admit date: 05/29/2015 Discharge date: 06/02/2015  Time spent: 45 minutes  Recommendations for Outpatient Follow-up:  Patient will be discharged to [facility] skilled nursing facility.  Patient will need to follow up with primary care provider within one week of discharge.  Follow up with Dr. Lorin Mercy in 1-2 weeks of discharge. Patient should continue medications as prescribed.  Patient should follow a heart healthy diet.   Discharge Diagnoses:  Severe sepsis likely secondary to UTI Acute on chronic kidney injury, stage III/ metabolic acidosis Hyperkalemia Abdominal pain Leukocytosis Somnolence COPD Essential hypertension Hyperlipidemia Cervical stenosis Right shoulder pain Right lateral malleolus nondisplaced ankle fracture  Discharge Condition: Stable  Diet recommendation: heart healthy  Filed Weights   05/30/15 0500 06/01/15 0503 06/02/15 0348  Weight: 90.5 kg (199 lb 8.3 oz) 90.583 kg (199 lb 11.2 oz) 89.495 kg (197 lb 4.8 oz)    History of present illness:  On 05/29/2015 Lannette Avellino is a 60 y.o. female with a medical history of COPD, essential hypertension, chronic kidney disease, cervical spine stenosis, but present to the emergency department after falling at the nursing facility if this today and complaining of right shoulder pain. Patient was given narcotic medications prior to arrival. She is currently somnolent, but arousable. Information in this history and physical were obtained however from EDP and prior records. Currently patient denies any chest pain, shortness of breath, headache, dizziness, back or neck pain. She does complain of abdominal pain. She was recently seen in emergency department a few days ago for nausea and vomiting. Patient does have a short leg cast on her right lower extremity however cannot tell me when this was placed. She cannot  tell me he was been following her for this. [facility] called for admission.  Hospital Course:  Severe sepsis likely secondary to UTI -Upon admission, patient leukocytosis of 36.6, lactic acid 2.44, hypotensive, acute kidney injury -Lactic acid and WBC improving -UA shows the PVCs 6-30, many bacteria, moderate leukocytes, calcium oxalate crystals -Urine culture >100K Ecoli -Blood cultures show no growth to date -CT abdomen and pelvis showed relative dilatation of the colon to the level of the splenic flexure where there is an abrupt contour change. No CT in this area, potentially could indicate focal scarring or residual of old ischemic episode -Was placed on vanc and aztreonam (given PCN allergy and MRSA) -Transitioned to ceftriaxone- will discharge with ceftin  Acute on chronic kidney injury, stage III/ metabolic acidosis -Baseline creatinine approximately 1.2-1.4, was 4.03 upon admission -Likely secondary to sepsis -Will hold nephrotoxic agents (ACE inhibitor) -Creatinine trending downward, currently 1.13  Hyperkalemia -Resolved, Likely secondary to acute kidney injury -Continue to monitor  Abdominal pain -Question whether this is related to CT findings as mentioned above versus constipation -Improving after bowel regimen started, will continue -AST and lipase, alk phosphatase within normal limits (ALT and bilirubin not calculated)  Leukocytosis -Improving, Most likely secondary to sepsis however patient was on Decadron -Will continue to monitor CBC along with the above treatment and plan  Somnolence -Resolved, Likely secondary to narcotics -Currently patient has no neurological deficits -CT head: Normal CT appearance of the brain, no acute fractures  COPD -Currently stable, no active wheezing -Continue home medications  Essential hypertension -Patient was hypotensive, and placed on IV fluids -Hold amlodipine, lisinopril -Placed on solucortef for possible AI -will  discontinue solucortef and decadron  Hyperlipidemia -Continue statin  Cervical stenosis -Status  post ACDF with Dr. Kathyrn Sheriff, neurosurgery -Patient was recently admitted and discharged on 05/16/2015 -Spoke with Dr. Kathyrn Sheriff via phone, can taper decadron  Right shoulder pain -Able to move RUE with ease -Shoulder xray negative  Right lateral malleolus nondisplaced ankle fracture -Patient does have short leg cast in place, done on 05/16/2015 -Patient is to follow with Dr. Lorin Mercy in 2-3 weeks. -Spoke with Dr. Lorin Mercy 5/19, follow up in the office after discharge  Consultants Dr. Kathyrn Sheriff, neurosurgery, via phone Dr. Lorin Mercy, ortho, via phone  Procedures  None  Discharge Exam: Filed Vitals:   06/01/15 2114 06/02/15 0653  BP: 168/89 165/85  Pulse: 95 78  Temp: 97.8 F (36.6 C) 97.5 F (36.4 C)  Resp: 18 18   Exam  General: Well developed, well nourished, no distress  HEENT: NCAT,mucous membranes moist.   Cardiovascular: S1 S2 auscultated, no murmurs, RRR  Respiratory: Clear to auscultation bilaterally  Abdomen: Soft, nontender, nondistended, + bowel sounds  Extremities: warm dry without cyanosis clubbing or edema. RLE in short leg cast  Neuro: AAOx3, nonfocal  Psych: Appropriate mood and affect, pleasant  Discharge Instructions      Discharge Instructions    Discharge instructions    Complete by:  As directed   Patient will be discharged to [facility] skilled nursing facility.  Patient will need to follow up with primary care provider within one week of discharge.  Follow up with Dr. Lorin Mercy in 1-2 weeks of discharge. Patient should continue medications as prescribed.  Patient should follow a heart healthy diet.            Medication List    STOP taking these medications        dexamethasone 4 MG tablet  Commonly known as:  DECADRON     Oxycodone HCl 10 MG Tabs     sucralfate 1 GM/10ML suspension  Commonly known as:  CARAFATE      TAKE these  medications        albuterol 108 (90 Base) MCG/ACT inhaler  Commonly known as:  PROVENTIL HFA;VENTOLIN HFA  Inhale 2 puffs into the lungs every 6 (six) hours as needed for wheezing or shortness of breath.     amitriptyline 50 MG tablet  Commonly known as:  ELAVIL  Take 50 mg by mouth at bedtime.     amLODipine 5 MG tablet  Commonly known as:  NORVASC  Take 5 mg by mouth daily.     atorvastatin 40 MG tablet  Commonly known as:  LIPITOR  Take 1 tablet (40 mg total) by mouth daily at 6 PM.     beclomethasone 80 MCG/ACT inhaler  Commonly known as:  QVAR  Inhale 2 puffs into the lungs 2 (two) times daily.     bismuth subsalicylate 195 KD/32IZ suspension  Commonly known as:  PEPTO BISMOL  Take 30 mLs by mouth every 6 (six) hours as needed.     lisinopril 20 MG tablet  Commonly known as:  PRINIVIL,ZESTRIL  Take 20 mg by mouth daily.     methocarbamol 500 MG tablet  Commonly known as:  ROBAXIN  Take 1 tablet (500 mg total) by mouth every 6 (six) hours as needed for muscle spasms.     metoCLOPramide 5 MG tablet  Commonly known as:  REGLAN  Take 2.5 mg by mouth 4 (four) times daily -  before meals and at bedtime.     oxybutynin 5 MG tablet  Commonly known as:  DITROPAN  Take 5 mg by mouth  2 (two) times daily.     pantoprazole 40 MG tablet  Commonly known as:  PROTONIX  Take 1 tablet (40 mg total) by mouth daily.     polyethylene glycol packet  Commonly known as:  MIRALAX / GLYCOLAX  Take 17 g by mouth daily.     promethazine 25 MG tablet  Commonly known as:  PHENERGAN  Take 1 tablet (25 mg total) by mouth every 6 (six) hours as needed for nausea or vomiting.     traMADol 50 MG tablet  Commonly known as:  ULTRAM  Take 1 tablet (50 mg total) by mouth every 6 (six) hours as needed for severe pain.       Allergies  Allergen Reactions  . Iohexol Anaphylaxis, Swelling and Other (See Comments)      Throat swelling    . Levofloxacin In D5w Other (See Comments)    Has  baseline prolonged QTc.  Avoid QTc prolonging medications.  Tresa Moore Hcl] Other (See Comments)    Has baseline prolonged QTc.  Avoid QTc prolonging medications.  . 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 Penicillins Itching    Has patient had a PCN reaction causing immediate rash, facial/tongue/throat swelling, SOB or lightheadedness with hypotension: yes Has patient had a PCN reaction causing severe rash involving mucus membranes or skin necrosis: unknown Has patient had a PCN reaction that required hospitalization : no, reacted while in the Drs office Has patient had a PCN reaction occurring within the last 10 years: yes If all of the above answers are "NO", then may proceed with Cephalosporin use.    Follow-up Information    Follow up with Philis Fendt, MD. Schedule an appointment as soon as possible for a visit in 1 week.   Specialty:  Internal Medicine   Why:  Hospital follow up   Contact information:   [place] [address] [phone]        The results of significant diagnostics from this hospitalization (including imaging, microbiology, ancillary and laboratory) are listed below for reference.    Significant Diagnostic Studies: Ct Abdomen Pelvis Wo Contrast  05/29/2015  CLINICAL DATA:  Constipation for past week. Diffuse abdomen pain with nausea and vomiting. Recent fall EXAM: CT ABDOMEN AND PELVIS WITHOUT CONTRAST TECHNIQUE: Multidetector CT imaging of the abdomen and pelvis was performed following the standard protocol without oral or intravenous contrast material administration. COMPARISON:  February 14, 2015 FINDINGS: Lower chest: There is patchy bibasilar lung atelectatic change. There is cardiomegaly with a small pericardial effusion. There is a hiatal hernia with esophageal  dilatation and fluid throughout the visualized esophagus. There are scattered foci of coronary artery calcification. Hepatobiliary: No focal liver lesions are identified on this noncontrast enhanced study. There is no demonstrable perihepatic fluid. Gallbladder wall is not appreciably thickened. There is no biliary duct dilatation. Pancreas: No pancreatic mass or inflammatory focus. There is fatty infiltration in the pancreas. Spleen: No splenic lesions are identified.  No perisplenic fluid. Adrenals/Urinary Tract: Adrenals appear normal bilaterally. There is a parapelvic regions cyst in the mid to upper left kidney measuring 1.4 x 1.4 cm. There is no hydronephrosis on either side. There is no renal or ureteral calculus on either side. The urinary bladder is midline with wall thickness within normal limits. Stomach/Bowel: There  is moderate stool throughout the colon. There are multiple loops of dilated colon without thickened colonic wall. The transverse colon measures 7.6 cm in diameter. The ascending colon measures 8.4 cm in diameter. The: Contour and nares rather abruptly at the level of the splenic flexure. A focal mass is not evident in this area by CT. There is no small bowel dilatation or wall thickening. No mesenteric thickening. No small bowel obstruction. No free air or portal venous air. Vascular/Lymphatic: There is atherosclerotic calcification in the aorta. No abdominal aortic aneurysm. Calcification is also noted in the common iliac arteries bilaterally. There is no obvious mesenteric vessel obstruction or high-grade stenosis. There is no adenopathy in the abdomen or pelvis. Reproductive: Uterus is absent. There is no pelvic mass or pelvic fluid collection. Other: Appendix appears normal. There is no ascites or abscess in the abdomen or pelvis. There is fat in the right inguinal ring. Musculoskeletal: There is lower lumbar levoscoliosis. There is degenerative change in the lumbar spine. There are no  appreciable blastic or lytic bone lesions. There is no intramuscular lesion. There are surgical clips along the anterior abdominal wall at the rectus muscles level. IMPRESSION: Relative dilatation of the colon to the level of the splenic flexure, where there is a rather abrupt contour change. By CT, there is no mass in this area. This finding potentially could indicate focal scarring or possibly residua of old ischemic episode focally in this area. It may be prudent to consider direct visualization of this area given this abrupt contour change focally at the splenic flexure level. There is moderate stool in the colon proximal to the splenic flexure. Only modest stool is seen distal to this area. No small bowel dilatation. No small bowel wall thickening is seen on this study. No free air. There is a hiatal hernia with dilatation of the visualized distal esophagus with extensive fluid in the visualized esophagus. Question chronic reflux change. Direct visualization of the esophagus may be warranted to assess for possible Barrett's esophagus given this finding. Small pericardial effusion. Scattered foci of coronary artery calcification noted. Appendix appears normal.  No abscess.  Uterus absent. No renal or ureteral calculus.  No hydronephrosis. Electronically Signed   By: Lowella Grip III M.D.   On: 05/29/2015 11:27   Dg Chest 2 View  05/25/2015  CLINICAL DATA:  60 year old female with chest pain EXAM: CHEST  2 VIEW COMPARISON:  Chest radiograph dated 04/09/2015 FINDINGS: Two views of the chest do not demonstrate a focal consolidation. There is no pleural effusion or pneumothorax. Linear atelectasis/ scarring noted in the right mid lung field. The cardiac silhouette is within normal limits. No acute osseous pathology. Lower cervical fixation plate and screws. IMPRESSION: No active cardiopulmonary disease. Electronically Signed   By: Anner Crete M.D.   On: 05/25/2015 00:43   Dg Shoulder  Right  05/30/2015  CLINICAL DATA:  Fall at nursing facility today. Right shoulder pain. EXAM: RIGHT SHOULDER - 2+ VIEW COMPARISON:  None. FINDINGS: No fracture. No dislocation. No significant arthropathic change. No bone lesion. Soft tissues are unremarkable. IMPRESSION: Negative. Electronically Signed   By: Lajean Manes M.D.   On: 05/30/2015 14:09   Dg Ankle 2 Views Right  05/12/2015  CLINICAL DATA:  Right ankle fracture after splinting EXAM: RIGHT ANKLE - 2 VIEW COMPARISON:  05/12/2015 at 0017 hours FINDINGS: Cast obscures bony detail. Near anatomic position and alignment involving fracture distal fibula. Suspected avulsion fracture lateral calcaneus not visible on the current study. IMPRESSION:  No significant displacement involving distal fibula fracture. Electronically Signed   By: Skipper Cliche M.D.   On: 05/12/2015 13:44   Dg Ankle Complete Right  05/12/2015  CLINICAL DATA:  Lateral right ankle pain after a fall 6 hours ago. EXAM: RIGHT ANKLE - COMPLETE 3+ VIEW COMPARISON:  None. FINDINGS: There is an oblique fracture of the distal right fibula with minimal cortical depression. No significant displacement. Distal tibia and talus appear intact. Mild lateral soft tissue swelling. Focal cortical irregularity along the distal lateral calcaneus probably represents avulsion fracture. IMPRESSION: Oblique nondisplaced fracture of the distal right fibula with probable avulsion fracture of the distal lateral calcaneus. Electronically Signed   By: Lucienne Capers M.D.   On: 05/12/2015 00:37   Ct Head Wo Contrast  05/29/2015  CLINICAL DATA:  60 year old female who fell out of bed this morning. Nausea vomiting, altered mental status, pain. Initial encounter. EXAM: CT HEAD WITHOUT CONTRAST CT CERVICAL SPINE WITHOUT CONTRAST TECHNIQUE: Multidetector CT imaging of the head and cervical spine was performed following the standard protocol without intravenous contrast. Multiplanar CT image reconstructions of the  cervical spine were also generated. COMPARISON:  Cervical spine CT 05/12/2015 and earlier. Head CT 02/14/2015 and earlier. FINDINGS: CT HEAD FINDINGS No scalp hematoma. Chronic left forehead scalp soft tissue scarring. Visualized orbit soft tissues are within normal limits. There might be a chronic left orbital floor fracture. Visualized paranasal sinuses and mastoids are clear. Calvarium intact. Extensive Calcified atherosclerosis at the skull base. Cerebral volume is normal. No midline shift, ventriculomegaly, mass effect, evidence of mass lesion, intracranial hemorrhage or evidence of cortically based acute infarction. Gray-white matter differentiation is within normal limits throughout the brain. No suspicious intracranial vascular hyperdensity. CT CERVICAL SPINE FINDINGS The patient is status post ACDF since February. C5-C6 and C6-C7 ACDF hardware remains in place and appear stable. Stable cervical vertebral height and alignment with straightening of lordosis. Cervicothoracic junction alignment is within normal limits. Bilateral posterior element alignment is within normal limits. Visualized skull base is intact. No atlanto-occipital dissociation. Chronic cervical disc and endplate degeneration at C3-C4 and C4-C5. No acute cervical spine fracture. Visualized upper thoracic levels appear grossly intact. Centrilobular and paraseptal emphysema in the lung apices. Negative noncontrast paraspinal soft tissues. The proximal esophagus is distended with air in fluid today (series 302, image 73). Visualized superior mediastinum otherwise stable and negative. IMPRESSION: 1. Stable and Normal noncontrast CT appearance of the brain. 2. No acute fracture or listhesis identified in the cervical spine. Ligamentous injury is not excluded. 3. Stable C5-C6 and C6-C7 ACDF. 4. Dilated, fluid-filled proximal esophagus today. Etiology and significance unclear. See CT Abdomen and Pelvis reported separately. 5. Pulmonary emphysema.  Electronically Signed   By: Genevie Ann M.D.   On: 05/29/2015 11:21   Ct Cervical Spine Wo Contrast  05/29/2015  CLINICAL DATA:  60 year old female who fell out of bed this morning. Nausea vomiting, altered mental status, pain. Initial encounter. EXAM: CT HEAD WITHOUT CONTRAST CT CERVICAL SPINE WITHOUT CONTRAST TECHNIQUE: Multidetector CT imaging of the head and cervical spine was performed following the standard protocol without intravenous contrast. Multiplanar CT image reconstructions of the cervical spine were also generated. COMPARISON:  Cervical spine CT 05/12/2015 and earlier. Head CT 02/14/2015 and earlier. FINDINGS: CT HEAD FINDINGS No scalp hematoma. Chronic left forehead scalp soft tissue scarring. Visualized orbit soft tissues are within normal limits. There might be a chronic left orbital floor fracture. Visualized paranasal sinuses and mastoids are clear. Calvarium intact. Extensive Calcified atherosclerosis  at the skull base. Cerebral volume is normal. No midline shift, ventriculomegaly, mass effect, evidence of mass lesion, intracranial hemorrhage or evidence of cortically based acute infarction. Gray-white matter differentiation is within normal limits throughout the brain. No suspicious intracranial vascular hyperdensity. CT CERVICAL SPINE FINDINGS The patient is status post ACDF since February. C5-C6 and C6-C7 ACDF hardware remains in place and appear stable. Stable cervical vertebral height and alignment with straightening of lordosis. Cervicothoracic junction alignment is within normal limits. Bilateral posterior element alignment is within normal limits. Visualized skull base is intact. No atlanto-occipital dissociation. Chronic cervical disc and endplate degeneration at C3-C4 and C4-C5. No acute cervical spine fracture. Visualized upper thoracic levels appear grossly intact. Centrilobular and paraseptal emphysema in the lung apices. Negative noncontrast paraspinal soft tissues. The proximal  esophagus is distended with air in fluid today (series 302, image 73). Visualized superior mediastinum otherwise stable and negative. IMPRESSION: 1. Stable and Normal noncontrast CT appearance of the brain. 2. No acute fracture or listhesis identified in the cervical spine. Ligamentous injury is not excluded. 3. Stable C5-C6 and C6-C7 ACDF. 4. Dilated, fluid-filled proximal esophagus today. Etiology and significance unclear. See CT Abdomen and Pelvis reported separately. 5. Pulmonary emphysema. Electronically Signed   By: Genevie Ann M.D.   On: 05/29/2015 11:21   Ct Cervical Spine Wo Contrast  05/12/2015  CLINICAL DATA:  Anterior cervical fusion recently. Recent falls over the last 4 days. Decreased sensation in the extremities. Diffuse neck tenderness. EXAM: CT CERVICAL SPINE WITHOUT CONTRAST TECHNIQUE: Multidetector CT imaging of the cervical spine was performed without intravenous contrast. Multiplanar CT image reconstructions were also generated. COMPARISON:  MRI 05/12/2015 FINDINGS: Changes of anterior fusion C5-C7. No hardware bony complicating feature. The previously seen disc protrusion at C5-6 is not as well visualized by CT. No fracture. Prevertebral soft tissues are normal. No visible epidural or paraspinal hematoma. IMPRESSION: Prior anterior fusion C5-C7.  No acute bony abnormality. Electronically Signed   By: Rolm Baptise M.D.   On: 05/12/2015 07:24   Mr Cervical Spine Wo Contrast  05/12/2015  CLINICAL DATA:  Status post cervical decompression 3 weeks ago. [place] today with unsteady gait and weakness. EXAM: MRI CERVICAL AND THORACIC SPINE WITHOUT CONTRAST TECHNIQUE: Multiplanar and multiecho pulse sequences of the cervical spine, to include the craniocervical junction and cervicothoracic junction, and thoracic spine, were obtained without intravenous contrast. Requested lumbar spine was unable to be performed due to patient motion. COMPARISON:  CT cervical spine March 06, 2015 in cervical spine  radiograph April 28, 2015 FINDINGS: MRI CERVICAL SPINE FINDINGS (multiple sequences are moderately motion degraded) Cervical vertebral bodies intact with maintenance of cervical lordosis. Status post C5 through C7 ACDF, better characterized on prior radiograph; hardware results in local susceptibility artifact. Moderate to severe C3-4 and C4-5 disc height loss. Decreased T2 signal within all non surgically altered disc compatible with desiccation. Moderate to severe chronic discogenic endplate changes F7-5 and C4-5. No STIR signal abnormality to suggest fracture. Multilevel mild spinal cord deformity with which short approximately 12 mm segment of mild suspected cervical spinal cord edema at C6-7 associated with cord compression as described below. No syrinx. Craniocervical junction is intact. Due to motion, the axial sequences do not correspond to the sagittal sequences and are nearly nondiagnostic. Small broad-based disc bulge is C2-3 through C4-5 resulting in moderate canal stenosis. Suspected at least moderate neural foraminal narrowing at these levels. At C5-6, status post discectomy. At C6-7, however it is a 4 mm central T2 bright  probable protrusion resulting in severe canal stenosis, AP dimension the canal is 5 mm. At C6-7 C7-T1 there is no definite disc bulge, canal stenosis or neural foraminal narrowing. MRI THORACIC SPINE FINDINGS Motion degraded examination, patient was unable to complete the examination and axial T1 sequence not obtained. Axial sequences do not well correspond to the sagittal sequences. Thoracic vertebral bodies intact and aligned and maintenance of the thoracic kyphosis. Intervertebral disc morphology generally preserved with decreased T2 signal within all disc compatible with mild desiccation. Multilevel mild chronic discogenic endplate changes. No definite STIR signal abnormality to suggest acute osseous process though limited by motion. Limited assessment of the prevertebral and  paraspinal muscles. Prominent epidural lipomatosis mildly narrows the thecal sac. In addition, T5-6 6 mm RIGHT central disc extrusion with 18 mm of contiguous superior and inferior migration results in at least moderate canal stenosis and cord deformity. No syrinx. Small broad-based disc bulge T8-9 results in mild canal stenosis and cord deformity. IMPRESSION: MRI CERVICAL SPINE: Limited motion degraded examination. Status post recent C5 through C7 ACDF. 4 mm probable disc protrusion at C5-6 (less likely seroma or abscess) results in severe canal stenosis and short segment of cord edema/ pre syrinx. No acute fracture or malalignment. Suspected at least moderate canal stenosis and neural foraminal narrowing C2-3 through C4-5. MRI THORACIC SPINE: Limited examination. Moderate T5-6 contiguous disc extrusion results in moderate canal stenosis. Mild canal stenosis at T8-9 due to small disc bulge. No acute fracture or malalignment. Acute findings discussed with and reconfirmed by Robbie Louis on 05/12/2015 at 6:13 am. Electronically Signed   By: Elon Alas M.D.   On: 05/12/2015 06:14   Mr Thoracic Spine Wo Contrast  05/12/2015  CLINICAL DATA:  Status post cervical decompression 3 weeks ago. [place] today with unsteady gait and weakness. EXAM: MRI CERVICAL AND THORACIC SPINE WITHOUT CONTRAST TECHNIQUE: Multiplanar and multiecho pulse sequences of the cervical spine, to include the craniocervical junction and cervicothoracic junction, and thoracic spine, were obtained without intravenous contrast. Requested lumbar spine was unable to be performed due to patient motion. COMPARISON:  CT cervical spine March 06, 2015 in cervical spine radiograph April 28, 2015 FINDINGS: MRI CERVICAL SPINE FINDINGS (multiple sequences are moderately motion degraded) Cervical vertebral bodies intact with maintenance of cervical lordosis. Status post C5 through C7 ACDF, better characterized on prior radiograph; hardware results in  local susceptibility artifact. Moderate to severe C3-4 and C4-5 disc height loss. Decreased T2 signal within all non surgically altered disc compatible with desiccation. Moderate to severe chronic discogenic endplate changes Y1-0 and C4-5. No STIR signal abnormality to suggest fracture. Multilevel mild spinal cord deformity with which short approximately 12 mm segment of mild suspected cervical spinal cord edema at C6-7 associated with cord compression as described below. No syrinx. Craniocervical junction is intact. Due to motion, the axial sequences do not correspond to the sagittal sequences and are nearly nondiagnostic. Small broad-based disc bulge is C2-3 through C4-5 resulting in moderate canal stenosis. Suspected at least moderate neural foraminal narrowing at these levels. At C5-6, status post discectomy. At C6-7, however it is a 4 mm central T2 bright probable protrusion resulting in severe canal stenosis, AP dimension the canal is 5 mm. At C6-7 C7-T1 there is no definite disc bulge, canal stenosis or neural foraminal narrowing. MRI THORACIC SPINE FINDINGS Motion degraded examination, patient was unable to complete the examination and axial T1 sequence not obtained. Axial sequences do not well correspond to the sagittal sequences. Thoracic vertebral bodies intact  and aligned and maintenance of the thoracic kyphosis. Intervertebral disc morphology generally preserved with decreased T2 signal within all disc compatible with mild desiccation. Multilevel mild chronic discogenic endplate changes. No definite STIR signal abnormality to suggest acute osseous process though limited by motion. Limited assessment of the prevertebral and paraspinal muscles. Prominent epidural lipomatosis mildly narrows the thecal sac. In addition, T5-6 6 mm RIGHT central disc extrusion with 18 mm of contiguous superior and inferior migration results in at least moderate canal stenosis and cord deformity. No syrinx. Small broad-based  disc bulge T8-9 results in mild canal stenosis and cord deformity. IMPRESSION: MRI CERVICAL SPINE: Limited motion degraded examination. Status post recent C5 through C7 ACDF. 4 mm probable disc protrusion at C5-6 (less likely seroma or abscess) results in severe canal stenosis and short segment of cord edema/ pre syrinx. No acute fracture or malalignment. Suspected at least moderate canal stenosis and neural foraminal narrowing C2-3 through C4-5. MRI THORACIC SPINE: Limited examination. Moderate T5-6 contiguous disc extrusion results in moderate canal stenosis. Mild canal stenosis at T8-9 due to small disc bulge. No acute fracture or malalignment. Acute findings discussed with and reconfirmed by Robbie Louis on 05/12/2015 at 6:13 am. Electronically Signed   By: Elon Alas M.D.   On: 05/12/2015 06:14   Dg Chest Portable 1 View  05/29/2015  CLINICAL DATA:  60 year old female with altered mental status. Recent chest pain. Initial encounter. EXAM: PORTABLE CHEST 1 VIEW COMPARISON:  05/25/2015 and earlier. FINDINGS: Portable AP semi upright view at at 1233 hours. Lower lung volumes. Mildly increased pulmonary vascularity without overt edema. Stable cardiac size and mediastinal contours. No pneumothorax. No pleural effusion or confluent pulmonary opacity identified. Cervical ACDF hardware re- demonstrated. IMPRESSION: Lower lung volumes with increased pulmonary vascularity but no acute pulmonary edema. Electronically Signed   By: Genevie Ann M.D.   On: 05/29/2015 13:05   Dg Ankle Left Port  05/12/2015  CLINICAL DATA:  Pain after a fall. EXAM: PORTABLE LEFT ANKLE - 2 VIEW COMPARISON:  None. FINDINGS: There is no evidence of fracture, dislocation, or joint effusion. There is no evidence of arthropathy or other focal bone abnormality. Soft tissues are unremarkable. IMPRESSION: Negative. Electronically Signed   By: Lucienne Capers M.D.   On: 05/12/2015 02:36    Microbiology: Recent Results (from the past 240  hour(s))  Blood culture (routine x 2)     Status: None (Preliminary result)   Collection Time: 05/29/15 12:40 PM  Result Value Ref Range Status   Specimen Description BLOOD LEFT ARM  Final   Special Requests BOTTLES DRAWN AEROBIC AND ANAEROBIC 5CC  Final   Culture NO GROWTH 4 DAYS  Final   Report Status PENDING  Incomplete  Blood culture (routine x 2)     Status: None (Preliminary result)   Collection Time: 05/29/15 12:48 PM  Result Value Ref Range Status   Specimen Description BLOOD LEFT ARM IV START  Final   Special Requests BOTTLES DRAWN AEROBIC AND ANAEROBIC 5CC  Final   Culture NO GROWTH 4 DAYS  Final   Report Status PENDING  Incomplete  Urine culture     Status: Abnormal   Collection Time: 05/29/15 12:56 PM  Result Value Ref Range Status   Specimen Description URINE, CATHETERIZED  Final   Special Requests NONE  Final   Culture >=100,000 COLONIES/mL ESCHERICHIA COLI (A)  Final   Report Status 05/31/2015 FINAL  Final   Organism ID, Bacteria ESCHERICHIA COLI (A)  Final  Susceptibility   Escherichia coli - MIC*    AMPICILLIN >=32 RESISTANT Resistant     CEFAZOLIN <=4 SENSITIVE Sensitive     CEFTRIAXONE <=1 SENSITIVE Sensitive     CIPROFLOXACIN >=4 RESISTANT Resistant     GENTAMICIN <=1 SENSITIVE Sensitive     IMIPENEM <=0.25 SENSITIVE Sensitive     NITROFURANTOIN <=16 SENSITIVE Sensitive     TRIMETH/SULFA >=320 RESISTANT Resistant     AMPICILLIN/SULBACTAM 16 INTERMEDIATE Intermediate     PIP/TAZO <=4 SENSITIVE Sensitive     * >=100,000 COLONIES/mL ESCHERICHIA COLI  MRSA PCR Screening     Status: None   Collection Time: 05/29/15  9:19 PM  Result Value Ref Range Status   MRSA by PCR NEGATIVE NEGATIVE Final    Comment:        The GeneXpert MRSA Assay (FDA approved for NASAL specimens only), is one component of a comprehensive MRSA colonization surveillance program. It is not intended to diagnose MRSA infection nor to guide or monitor treatment for MRSA  infections.      Labs: Basic Metabolic Panel:  Recent Labs Lab 05/29/15 2257 05/30/15 0215  05/31/15 0120 05/31/15 1128 05/31/15 1331 06/01/15 0537 06/02/15 0722  NA 136 137  --   --  143  --  141 142  K 5.4* 5.0  < > 4.6 6.6* 4.6 3.8 3.5  CL 105 109  --   --  122*  --  114* 117*  CO2 16* 17*  --   --  10*  --  15* 19*  GLUCOSE 70 108*  --   --  66  --  98 102*  BUN 71* 65*  --   --  44*  --  37* 26*  CREATININE 2.48* 2.22*  --   --  1.36*  --  1.28* 1.13*  CALCIUM 8.3* 8.1*  --   --  8.0*  --  8.1* 8.3*  MG  --   --   --   --   --   --  2.0  --   < > = values in this interval not displayed. Liver Function Tests:  Recent Labs Lab 05/29/15 1048 05/29/15 2257 05/30/15 0215  AST 23 24 18   ALT QUANTITY NOT SUFFICIENT, UNABLE TO PERFORM TEST 20 18  ALKPHOS 89 100 89  BILITOT QUANTITY NOT SUFFICIENT, UNABLE TO PERFORM TEST 1.0 0.9  PROT 6.1* 5.9* 5.2*  ALBUMIN 2.4* 2.2* 1.9*    Recent Labs Lab 05/29/15 1048  LIPASE 20   No results for input(s): AMMONIA in the last 168 hours. CBC:  Recent Labs Lab 05/29/15 1048 05/30/15 0215 05/31/15 0520 06/01/15 0537 06/02/15 0500  WBC 36.6* 25.7* 17.1* 16.4* 12.6*  NEUTROABS 33.6*  --   --   --   --   HGB 14.0 12.6 10.8* 10.6* 11.1*  HCT 40.6 38.1 32.6* 31.2* 34.1*  MCV 83.7 85.6 85.8 83.4 87.4  PLT 198 175 211 201 259   Cardiac Enzymes: No results for input(s): CKTOTAL, CKMB, CKMBINDEX, TROPONINI in the last 168 hours. BNP: BNP (last 3 results)  Recent Labs  10/31/14 1012  BNP 252.6*    ProBNP (last 3 results) No results for input(s): PROBNP in the last 8760 hours.  CBG: No results for input(s): GLUCAP in the last 168 hours.     SignedCristal Ford  [facility] 06/02/2015, 12:12 PM

## 2015-06-03 ENCOUNTER — Encounter: Payer: Self-pay | Admitting: Adult Health

## 2015-06-03 ENCOUNTER — Non-Acute Institutional Stay (SKILLED_NURSING_FACILITY): Payer: Medicaid Other | Admitting: Adult Health

## 2015-06-03 DIAGNOSIS — E785 Hyperlipidemia, unspecified: Secondary | ICD-10-CM

## 2015-06-03 DIAGNOSIS — I10 Essential (primary) hypertension: Secondary | ICD-10-CM | POA: Diagnosis not present

## 2015-06-03 DIAGNOSIS — K219 Gastro-esophageal reflux disease without esophagitis: Secondary | ICD-10-CM | POA: Diagnosis not present

## 2015-06-03 DIAGNOSIS — N3000 Acute cystitis without hematuria: Secondary | ICD-10-CM

## 2015-06-03 DIAGNOSIS — R6520 Severe sepsis without septic shock: Secondary | ICD-10-CM

## 2015-06-03 DIAGNOSIS — J410 Simple chronic bronchitis: Secondary | ICD-10-CM

## 2015-06-03 DIAGNOSIS — A419 Sepsis, unspecified organism: Secondary | ICD-10-CM

## 2015-06-03 DIAGNOSIS — K449 Diaphragmatic hernia without obstruction or gangrene: Secondary | ICD-10-CM

## 2015-06-03 DIAGNOSIS — Z981 Arthrodesis status: Secondary | ICD-10-CM | POA: Diagnosis not present

## 2015-06-03 DIAGNOSIS — S82891A Other fracture of right lower leg, initial encounter for closed fracture: Secondary | ICD-10-CM | POA: Insufficient documentation

## 2015-06-03 DIAGNOSIS — M4802 Spinal stenosis, cervical region: Secondary | ICD-10-CM | POA: Diagnosis not present

## 2015-06-03 LAB — CULTURE, BLOOD (ROUTINE X 2)
CULTURE: NO GROWTH
Culture: NO GROWTH
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
## 2015-06-05 ENCOUNTER — Non-Acute Institutional Stay (SKILLED_NURSING_FACILITY): Payer: Medicaid Other | Admitting: Internal Medicine

## 2015-06-05 ENCOUNTER — Encounter: Payer: Self-pay | Admitting: Internal Medicine

## 2015-06-05 DIAGNOSIS — I10 Essential (primary) hypertension: Secondary | ICD-10-CM | POA: Diagnosis not present

## 2015-06-05 DIAGNOSIS — B9620 Unspecified Escherichia coli [E. coli] as the cause of diseases classified elsewhere: Secondary | ICD-10-CM | POA: Diagnosis not present

## 2015-06-05 DIAGNOSIS — N179 Acute kidney failure, unspecified: Secondary | ICD-10-CM

## 2015-06-05 DIAGNOSIS — R6520 Severe sepsis without septic shock: Secondary | ICD-10-CM

## 2015-06-05 DIAGNOSIS — N390 Urinary tract infection, site not specified: Secondary | ICD-10-CM | POA: Diagnosis not present

## 2015-06-05 DIAGNOSIS — A419 Sepsis, unspecified organism: Secondary | ICD-10-CM | POA: Diagnosis not present

## 2015-06-05 DIAGNOSIS — D72829 Elevated white blood cell count, unspecified: Secondary | ICD-10-CM

## 2015-06-05 DIAGNOSIS — K219 Gastro-esophageal reflux disease without esophagitis: Secondary | ICD-10-CM | POA: Diagnosis not present

## 2015-06-05 DIAGNOSIS — J410 Simple chronic bronchitis: Secondary | ICD-10-CM

## 2015-06-05 DIAGNOSIS — E785 Hyperlipidemia, unspecified: Secondary | ICD-10-CM
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
## 2015-06-06 LAB — CBC AND DIFFERENTIAL
HEMATOCRIT: 31 % — AB (ref 36–46)
HEMOGLOBIN: 9.7 g/dL — AB (ref 12.0–16.0)
PLATELETS: 208 10*3/uL (ref 150–399)
WBC: 11.1 10^3/mL

## 2015-06-06 LAB — HEPATIC FUNCTION PANEL
ALK PHOS: 90 U/L (ref 25–125)
ALT: 16 U/L (ref 7–35)
AST: 13 U/L (ref 13–35)
BILIRUBIN, TOTAL: 0.3 mg/dL

## 2015-06-06 LAB — BASIC METABOLIC PANEL
BUN: 8 mg/dL (ref 4–21)
Creatinine: 0.8 mg/dL (ref 0.5–1.1)
Glucose: 114 mg/dL
Potassium: 3.1 mmol/L — AB (ref 3.4–5.3)
SODIUM: 141 mmol/L (ref 137–147)

## 2015-06-07 ENCOUNTER — Encounter: Payer: Self-pay | Admitting: Internal Medicine

## 2015-06-07 NOTE — Assessment & Plan Note (Signed)
Currently stable, no active wheezing SNF-Continue home medication Qvar with prn albuterol

## 2015-06-07 NOTE — Assessment & Plan Note (Signed)
SNF - tx with rocephin in hospital and d/c with ceftin

## 2015-06-07 NOTE — Assessment & Plan Note (Signed)
Improving, Most likely secondary to sepsis however patient was on Decadron SNF - will f/u BMP

## 2015-06-07 NOTE — Assessment & Plan Note (Signed)
Baseline creatinine approximately 1.2-1.4, was 4.03 upon admission -Likely secondary to sepsis SNF - d/c Cr 1.13; will f/u BMP

## 2015-06-07 NOTE — Assessment & Plan Note (Signed)
SNF - cont protonix 40 mg and reglan 4 mg QID

## 2015-06-07 NOTE — Assessment & Plan Note (Signed)
SNF - not stated as uncontrolled;cont lipitor 40 mg daily ;current LFT's are normal

## 2015-06-07 NOTE — Assessment & Plan Note (Signed)
likely secondary to UTI -Upon admission, patient leukocytosis of 36.6, lactic acid 2.44, hypotensive, acute kidney injury -Lactic acid and WBC improving -UA shows the PVCs 6-30, many bacteria, moderate leukocytes, calcium oxalate crystals -Urine culture >100K Ecoli -Blood cultures show no growth to date -CT abdomen and pelvis showed relative dilatation of the colon to the level of the splenic flexure where there is an abrupt contour change. No CT in this area, potentially could indicate focal scarring or residual of old ischemic episode -Was placed on vanc and aztreonam (given PCN allergy and MRSA) -Transitioned to ceftriaxone- SNF - d/c on ceftin to finish course

## 2015-06-07 NOTE — Assessment & Plan Note (Signed)
SNF - controlled on Lisinopril 20 mg and norvasc 5 mg; will f/u BMP

## 2015-06-10 ENCOUNTER — Non-Acute Institutional Stay (SKILLED_NURSING_FACILITY): Payer: Medicaid Other | Admitting: Adult Health

## 2015-06-10 ENCOUNTER — Encounter: Payer: Self-pay | Admitting: Adult Health

## 2015-06-10 DIAGNOSIS — D6489 Other specified anemias: Secondary | ICD-10-CM | POA: Diagnosis not present

## 2015-06-10 DIAGNOSIS — E46 Unspecified protein-calorie malnutrition: Secondary | ICD-10-CM | POA: Diagnosis not present

## 2015-06-10 DIAGNOSIS — E8809 Other disorders of plasma-protein metabolism, not elsewhere classified: Secondary | ICD-10-CM

## 2015-06-10 DIAGNOSIS — E876 Hypokalemia: Secondary | ICD-10-CM

## 2015-06-10 DIAGNOSIS — D649 Anemia, unspecified: Secondary | ICD-10-CM | POA: Insufficient documentation
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
## 2015-06-13 ENCOUNTER — Encounter: Payer: Self-pay | Admitting: Adult Health

## 2015-06-13 ENCOUNTER — Non-Acute Institutional Stay (SKILLED_NURSING_FACILITY): Payer: Medicaid Other | Admitting: Adult Health

## 2015-06-13 DIAGNOSIS — K219 Gastro-esophageal reflux disease without esophagitis: Secondary | ICD-10-CM | POA: Diagnosis not present

## 2015-06-13 DIAGNOSIS — D6489 Other specified anemias: Secondary | ICD-10-CM

## 2015-06-13 DIAGNOSIS — N183 Chronic kidney disease, stage 3 unspecified: Secondary | ICD-10-CM

## 2015-06-13 DIAGNOSIS — K449 Diaphragmatic hernia without obstruction or gangrene: Secondary | ICD-10-CM

## 2015-06-13 DIAGNOSIS — E785 Hyperlipidemia, unspecified: Secondary | ICD-10-CM | POA: Diagnosis not present

## 2015-06-13 DIAGNOSIS — J410 Simple chronic bronchitis: Secondary | ICD-10-CM | POA: Diagnosis not present

## 2015-06-13 DIAGNOSIS — M069 Rheumatoid arthritis, unspecified: Secondary | ICD-10-CM | POA: Diagnosis not present

## 2015-06-13 DIAGNOSIS — K5901 Slow transit constipation: Secondary | ICD-10-CM | POA: Diagnosis not present

## 2015-06-13 DIAGNOSIS — I10 Essential (primary) hypertension: Secondary | ICD-10-CM
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
## 2015-06-20 ENCOUNTER — Encounter: Payer: Self-pay | Admitting: Adult Health

## 2015-06-20 ENCOUNTER — Non-Acute Institutional Stay (SKILLED_NURSING_FACILITY): Payer: Medicaid Other | Admitting: Adult Health

## 2015-06-20 DIAGNOSIS — B370 Candidal stomatitis: Secondary | ICD-10-CM | POA: Diagnosis not present

## 2015-06-20 LAB — IRON AND TIBC
Iron: 18
POTASSIUM: 4.7 mmol/L
TIBC: 149
TRANSFERRIN SATURATION: 11.81
UIBC: 131
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
## 2015-06-28 DIAGNOSIS — B370 Candidal stomatitis: Secondary | ICD-10-CM | POA: Insufficient documentation

## 2015-06-29 ENCOUNTER — Emergency Department (HOSPITAL_COMMUNITY): Payer: Medicaid Other

## 2015-06-29 ENCOUNTER — Other Ambulatory Visit: Payer: Self-pay

## 2015-06-29 ENCOUNTER — Inpatient Hospital Stay (HOSPITAL_COMMUNITY)
Admission: EM | Admit: 2015-06-29 | Discharge: 2015-07-02 | DRG: 871 | Disposition: A | Discharge status: Skilled Nursing Facility | Payer: Medicaid Other | Attending: Family Medicine | Admitting: Family Medicine

## 2015-06-29 ENCOUNTER — Encounter (HOSPITAL_COMMUNITY): Payer: Self-pay | Admitting: Emergency Medicine

## 2015-06-29 ENCOUNTER — Inpatient Hospital Stay (HOSPITAL_COMMUNITY): Payer: Medicaid Other

## 2015-06-29 DIAGNOSIS — J189 Pneumonia, unspecified organism: Secondary | ICD-10-CM | POA: Diagnosis present

## 2015-06-29 DIAGNOSIS — N390 Urinary tract infection, site not specified: Secondary | ICD-10-CM

## 2015-06-29 DIAGNOSIS — J449 Chronic obstructive pulmonary disease, unspecified: Secondary | ICD-10-CM | POA: Diagnosis present

## 2015-06-29 DIAGNOSIS — B370 Candidal stomatitis: Secondary | ICD-10-CM

## 2015-06-29 DIAGNOSIS — R0789 Other chest pain: Secondary | ICD-10-CM | POA: Diagnosis not present

## 2015-06-29 DIAGNOSIS — Y95 Nosocomial condition: Secondary | ICD-10-CM | POA: Diagnosis present

## 2015-06-29 DIAGNOSIS — Z981 Arthrodesis status: Secondary | ICD-10-CM | POA: Diagnosis not present

## 2015-06-29 DIAGNOSIS — R079 Chest pain, unspecified: Secondary | ICD-10-CM | POA: Diagnosis present

## 2015-06-29 DIAGNOSIS — F17210 Nicotine dependence, cigarettes, uncomplicated: Secondary | ICD-10-CM | POA: Diagnosis present

## 2015-06-29 DIAGNOSIS — J410 Simple chronic bronchitis: Secondary | ICD-10-CM | POA: Diagnosis not present

## 2015-06-29 DIAGNOSIS — J029 Acute pharyngitis, unspecified: Secondary | ICD-10-CM

## 2015-06-29 DIAGNOSIS — E875 Hyperkalemia: Secondary | ICD-10-CM

## 2015-06-29 DIAGNOSIS — E46 Unspecified protein-calorie malnutrition: Secondary | ICD-10-CM

## 2015-06-29 DIAGNOSIS — R6520 Severe sepsis without septic shock: Secondary | ICD-10-CM

## 2015-06-29 DIAGNOSIS — D6489 Other specified anemias: Secondary | ICD-10-CM

## 2015-06-29 DIAGNOSIS — K760 Fatty (change of) liver, not elsewhere classified: Secondary | ICD-10-CM | POA: Diagnosis present

## 2015-06-29 DIAGNOSIS — A419 Sepsis, unspecified organism: Secondary | ICD-10-CM

## 2015-06-29 DIAGNOSIS — I10 Essential (primary) hypertension: Secondary | ICD-10-CM

## 2015-06-29 DIAGNOSIS — I959 Hypotension, unspecified: Secondary | ICD-10-CM

## 2015-06-29 DIAGNOSIS — F1419 Cocaine abuse with unspecified cocaine-induced disorder: Secondary | ICD-10-CM

## 2015-06-29 DIAGNOSIS — E860 Dehydration: Secondary | ICD-10-CM | POA: Diagnosis present

## 2015-06-29 DIAGNOSIS — R1900 Intra-abdominal and pelvic swelling, mass and lump, unspecified site: Secondary | ICD-10-CM

## 2015-06-29 DIAGNOSIS — N1330 Unspecified hydronephrosis: Secondary | ICD-10-CM

## 2015-06-29 DIAGNOSIS — M069 Rheumatoid arthritis, unspecified: Secondary | ICD-10-CM

## 2015-06-29 DIAGNOSIS — R1011 Right upper quadrant pain: Secondary | ICD-10-CM

## 2015-06-29 DIAGNOSIS — K5901 Slow transit constipation: Secondary | ICD-10-CM

## 2015-06-29 DIAGNOSIS — N179 Acute kidney failure, unspecified: Secondary | ICD-10-CM | POA: Diagnosis present

## 2015-06-29 DIAGNOSIS — I2699 Other pulmonary embolism without acute cor pulmonale: Secondary | ICD-10-CM | POA: Diagnosis not present

## 2015-06-29 DIAGNOSIS — N183 Chronic kidney disease, stage 3 (moderate): Secondary | ICD-10-CM | POA: Diagnosis present

## 2015-06-29 DIAGNOSIS — Z79899 Other long term (current) drug therapy: Secondary | ICD-10-CM | POA: Diagnosis not present

## 2015-06-29 DIAGNOSIS — I951 Orthostatic hypotension: Secondary | ICD-10-CM | POA: Diagnosis present

## 2015-06-29 DIAGNOSIS — J440 Chronic obstructive pulmonary disease with acute lower respiratory infection: Secondary | ICD-10-CM | POA: Diagnosis present

## 2015-06-29 DIAGNOSIS — Z91041 Radiographic dye allergy status: Secondary | ICD-10-CM | POA: Diagnosis not present

## 2015-06-29 DIAGNOSIS — K8689 Other specified diseases of pancreas: Secondary | ICD-10-CM

## 2015-06-29 DIAGNOSIS — E782 Mixed hyperlipidemia: Secondary | ICD-10-CM | POA: Diagnosis present

## 2015-06-29 DIAGNOSIS — E785 Hyperlipidemia, unspecified: Secondary | ICD-10-CM

## 2015-06-29 DIAGNOSIS — E872 Acidosis, unspecified: Secondary | ICD-10-CM

## 2015-06-29 DIAGNOSIS — G629 Polyneuropathy, unspecified: Secondary | ICD-10-CM

## 2015-06-29 DIAGNOSIS — I129 Hypertensive chronic kidney disease with stage 1 through stage 4 chronic kidney disease, or unspecified chronic kidney disease: Secondary | ICD-10-CM | POA: Diagnosis present

## 2015-06-29 DIAGNOSIS — F329 Major depressive disorder, single episode, unspecified: Secondary | ICD-10-CM | POA: Diagnosis present

## 2015-06-29 DIAGNOSIS — K449 Diaphragmatic hernia without obstruction or gangrene: Secondary | ICD-10-CM | POA: Diagnosis present

## 2015-06-29 DIAGNOSIS — Z720 Tobacco use: Secondary | ICD-10-CM

## 2015-06-29 DIAGNOSIS — R140 Abdominal distension (gaseous): Secondary | ICD-10-CM | POA: Diagnosis present

## 2015-06-29 DIAGNOSIS — E8809 Other disorders of plasma-protein metabolism, not elsewhere classified: Secondary | ICD-10-CM

## 2015-06-29 DIAGNOSIS — G9340 Encephalopathy, unspecified: Secondary | ICD-10-CM

## 2015-06-29 DIAGNOSIS — N3000 Acute cystitis without hematuria: Secondary | ICD-10-CM

## 2015-06-29 DIAGNOSIS — R6510 Systemic inflammatory response syndrome (SIRS) of non-infectious origin without acute organ dysfunction: Secondary | ICD-10-CM

## 2015-06-29 DIAGNOSIS — D72829 Elevated white blood cell count, unspecified: Secondary | ICD-10-CM

## 2015-06-29 DIAGNOSIS — S82891A Other fracture of right lower leg, initial encounter for closed fracture: Secondary | ICD-10-CM

## 2015-06-29 DIAGNOSIS — R109 Unspecified abdominal pain: Secondary | ICD-10-CM | POA: Diagnosis present

## 2015-06-29 DIAGNOSIS — M4802 Spinal stenosis, cervical region: Secondary | ICD-10-CM

## 2015-06-29 DIAGNOSIS — G9341 Metabolic encephalopathy: Secondary | ICD-10-CM

## 2015-06-29 DIAGNOSIS — R1013 Epigastric pain: Secondary | ICD-10-CM

## 2015-06-29 DIAGNOSIS — F1410 Cocaine abuse, uncomplicated: Secondary | ICD-10-CM

## 2015-06-29 DIAGNOSIS — R52 Pain, unspecified: Secondary | ICD-10-CM

## 2015-06-29 DIAGNOSIS — K219 Gastro-esophageal reflux disease without esophagitis: Secondary | ICD-10-CM

## 2015-06-29 HISTORY — DX: Chronic kidney disease, stage 3 (moderate): N18.3

## 2015-06-29 HISTORY — DX: Chronic kidney disease, stage 3 unspecified: N18.30

## 2015-06-29 LAB — TROPONIN I
TROPONIN I: 0.04 ng/mL — AB (ref <0.031)
Troponin I: <0.03 ng/mL (ref <0.031)

## 2015-06-29 LAB — URINE MICROSCOPIC-ADD ON

## 2015-06-29 LAB — I-STAT CHEM 8, ED
BUN: 34 mg/dL — AB (ref 6–20)
CHLORIDE: 106 mmol/L (ref 101–111)
CREATININE: 2.7 mg/dL — AB (ref 0.44–1.00)
Calcium, Ion: 1.14 mmol/L (ref 1.13–1.30)
Glucose, Bld: 106 mg/dL — ABNORMAL HIGH (ref 65–99)
HEMATOCRIT: 34 % — AB (ref 36.0–46.0)
Hemoglobin: 11.6 g/dL — ABNORMAL LOW (ref 12.0–15.0)
POTASSIUM: 6.2 mmol/L — AB (ref 3.5–5.1)
Sodium: 135 mmol/L (ref 135–145)
TCO2: 21 mmol/L (ref 0–100)

## 2015-06-29 LAB — CBC WITH DIFFERENTIAL/PLATELET
Basophils Absolute: 0 10*3/uL (ref 0.0–0.1)
Basophils Relative: 0 %
EOS ABS: 0.1 10*3/uL (ref 0.0–0.7)
EOS PCT: 1 %
HCT: 32 % — ABNORMAL LOW (ref 36.0–46.0)
HEMOGLOBIN: 10.1 g/dL — AB (ref 12.0–15.0)
LYMPHS ABS: 3.5 10*3/uL (ref 0.7–4.0)
LYMPHS PCT: 31 %
MCH: 27.9 pg (ref 26.0–34.0)
MCHC: 31.6 g/dL (ref 30.0–36.0)
MCV: 88.4 fL (ref 78.0–100.0)
MONOS PCT: 6 %
Monocytes Absolute: 0.7 10*3/uL (ref 0.1–1.0)
Neutro Abs: 7.1 10*3/uL (ref 1.7–7.7)
Neutrophils Relative %: 62 %
Platelets: 358 10*3/uL (ref 150–400)
RBC: 3.62 MIL/uL — AB (ref 3.87–5.11)
RDW: 15.1 % (ref 11.5–15.5)
WBC: 11.4 10*3/uL — AB (ref 4.0–10.5)

## 2015-06-29 LAB — COMPREHENSIVE METABOLIC PANEL
ALBUMIN: 2.4 g/dL — AB (ref 3.5–5.0)
ALK PHOS: 94 U/L (ref 38–126)
ALT: 13 U/L — AB (ref 14–54)
AST: 14 U/L — AB (ref 15–41)
Anion gap: 9 (ref 5–15)
BILIRUBIN TOTAL: 0.4 mg/dL (ref 0.3–1.2)
BUN: 34 mg/dL — AB (ref 6–20)
CO2: 19 mmol/L — ABNORMAL LOW (ref 22–32)
CREATININE: 2.73 mg/dL — AB (ref 0.44–1.00)
Calcium: 8.6 mg/dL — ABNORMAL LOW (ref 8.9–10.3)
Chloride: 105 mmol/L (ref 101–111)
GFR calc Af Amer: 20 mL/min — ABNORMAL LOW (ref >60)
GFR, EST NON AFRICAN AMERICAN: 18 mL/min — AB (ref >60)
GLUCOSE: 107 mg/dL — AB (ref 65–99)
Potassium: 6 mmol/L — ABNORMAL HIGH (ref 3.5–5.1)
Sodium: 133 mmol/L — ABNORMAL LOW (ref 135–145)
TOTAL PROTEIN: 6.9 g/dL (ref 6.5–8.1)

## 2015-06-29 LAB — HIV ANTIBODY (ROUTINE TESTING W REFLEX): HIV Screen 4th Generation wRfx: NONREACTIVE

## 2015-06-29 LAB — LACTIC ACID, PLASMA
LACTIC ACID, VENOUS: 2.3 mmol/L — AB (ref 0.5–2.0)
LACTIC ACID, VENOUS: 0.9 mmol/L (ref 0.5–2.0)
Lactic Acid, Venous: 2.6 mmol/L (ref 0.5–2.0)
Lactic Acid, Venous: 0.9 mmol/L (ref 0.5–2.0)

## 2015-06-29 LAB — URINALYSIS, ROUTINE W REFLEX MICROSCOPIC
GLUCOSE, UA: NEGATIVE mg/dL
Ketones, ur: 15 mg/dL — AB
Nitrite: POSITIVE — AB
Protein, ur: 30 mg/dL — AB
SPECIFIC GRAVITY, URINE: 1.021 (ref 1.005–1.030)
pH: 5.5 (ref 5.0–8.0)

## 2015-06-29 LAB — MRSA PCR SCREENING: MRSA BY PCR: POSITIVE — AB

## 2015-06-29 LAB — I-STAT TROPONIN, ED: Troponin i, poc: 0.01 ng/mL (ref 0.00–0.08)

## 2015-06-29 LAB — PROCALCITONIN: PROCALCITONIN: 0.11 ng/mL

## 2015-06-29 LAB — RAPID URINE DRUG SCREEN, HOSP PERFORMED
Amphetamines: NOT DETECTED
Barbiturates: NOT DETECTED
Benzodiazepines: NOT DETECTED
Cocaine: NOT DETECTED
Opiates: NOT DETECTED
Tetrahydrocannabinol: NOT DETECTED

## 2015-06-29 LAB — D-DIMER, QUANTITATIVE: D-Dimer, Quant: 1.67 ug/mL-FEU — ABNORMAL HIGH (ref 0.00–0.50)

## 2015-06-29 LAB — HEPARIN LEVEL (UNFRACTIONATED)
HEPARIN UNFRACTIONATED: 0.54 [IU]/mL (ref 0.30–0.70)
Heparin Unfractionated: 1.04 IU/mL — ABNORMAL HIGH (ref 0.30–0.70)

## 2015-06-29 LAB — LIPASE, BLOOD: Lipase: 19 U/L (ref 11–51)

## 2015-06-29 LAB — SODIUM, URINE, RANDOM: Sodium, Ur: 77 mmol/L

## 2015-06-29 LAB — I-STAT CG4 LACTIC ACID, ED: LACTIC ACID, VENOUS: 2.21 mmol/L — AB (ref 0.5–2.0)

## 2015-06-29 LAB — CREATININE, URINE, RANDOM: Creatinine, Urine: 70.06 mg/dL

## 2015-06-29 LAB — PROTIME-INR
INR: 1.13 (ref 0.00–1.49)
PROTHROMBIN TIME: 14.7 s (ref 11.6–15.2)

## 2015-06-29 LAB — STREP PNEUMONIAE URINARY ANTIGEN: Strep Pneumo Urinary Antigen: NEGATIVE

## 2015-06-29 LAB — VITAMIN B12: Vitamin B-12: 740 pg/mL (ref 180–914)

## 2015-06-29 LAB — APTT: APTT: 31 s (ref 24–37)

## 2015-06-29 MED ORDER — CHLORHEXIDINE GLUCONATE CLOTH 2 % EX PADS
6.0000 | MEDICATED_PAD | Freq: Every day | CUTANEOUS | Status: DC
  Administered 2015-06-29 – 2015-07-02 (×3): 6 via TOPICAL

## 2015-06-29 MED ORDER — OXYBUTYNIN CHLORIDE 5 MG PO TABS
5.0000 mg | ORAL_TABLET | Freq: Two times a day (BID) | ORAL | Status: DC
  Administered 2015-06-29 – 2015-07-02 (×7): 5 mg via ORAL
  Filled 2015-06-29 (×7): qty 1

## 2015-06-29 MED ORDER — ZINC OXIDE 40 % EX OINT
TOPICAL_OINTMENT | Freq: Two times a day (BID) | CUTANEOUS | Status: DC
  Administered 2015-06-29: 13:00:00 via TOPICAL
  Administered 2015-07-01: 1 via TOPICAL
  Administered 2015-07-01: 22:00:00 via TOPICAL
  Filled 2015-06-29: qty 114

## 2015-06-29 MED ORDER — TECHNETIUM TO 99M ALBUMIN AGGREGATED
4.2900 | Freq: Once | INTRAVENOUS | Status: AC | PRN
  Administered 2015-06-29: 4 via INTRAVENOUS

## 2015-06-29 MED ORDER — POLYETHYLENE GLYCOL 3350 17 G PO PACK: 17.0000 g | PACK | Freq: Every day | ORAL | Status: DC | PRN

## 2015-06-29 MED ORDER — DEXTROSE 5 % IV SOLN
1.0000 g | INTRAVENOUS | Status: DC
  Administered 2015-06-30: 1 g via INTRAVENOUS
  Filled 2015-06-29: qty 1

## 2015-06-29 MED ORDER — ALBUTEROL SULFATE (2.5 MG/3ML) 0.083% IN NEBU: 2.5000 mg | INHALATION_SOLUTION | RESPIRATORY_TRACT | Status: DC | PRN

## 2015-06-29 MED ORDER — SODIUM CHLORIDE 0.9 % IV SOLN
INTRAVENOUS | Status: DC
  Administered 2015-06-29 – 2015-06-30 (×3): via INTRAVENOUS

## 2015-06-29 MED ORDER — SODIUM POLYSTYRENE SULFONATE 15 GM/60ML PO SUSP
30.0000 g | Freq: Once | ORAL | Status: AC
  Administered 2015-06-29: 30 g via ORAL
  Filled 2015-06-29: qty 120

## 2015-06-29 MED ORDER — NYSTATIN 100000 UNIT/GM EX POWD
Freq: Two times a day (BID) | CUTANEOUS | Status: DC
  Administered 2015-06-29 – 2015-07-01 (×5): via TOPICAL
  Administered 2015-07-01: 1 via TOPICAL
  Filled 2015-06-29: qty 15

## 2015-06-29 MED ORDER — DEXTROSE 50 % IV SOLN
25.0000 mL | Freq: Once | INTRAVENOUS | Status: AC
  Administered 2015-06-29: 25 mL via INTRAVENOUS
  Filled 2015-06-29: qty 50

## 2015-06-29 MED ORDER — BUDESONIDE 0.25 MG/2ML IN SUSP
0.2500 mg | Freq: Two times a day (BID) | RESPIRATORY_TRACT | Status: DC
  Administered 2015-06-29 – 2015-07-02 (×6): 0.25 mg via RESPIRATORY_TRACT
  Filled 2015-06-29 (×6): qty 2

## 2015-06-29 MED ORDER — DEXTROSE 5 % IV SOLN
2.0000 g | INTRAVENOUS | Status: AC
  Administered 2015-06-29: 2 g via INTRAVENOUS
  Filled 2015-06-29: qty 2

## 2015-06-29 MED ORDER — FERROUS SULFATE 325 (65 FE) MG PO TABS
325.0000 mg | ORAL_TABLET | Freq: Every day | ORAL | Status: DC
  Administered 2015-06-30 – 2015-07-02 (×3): 325 mg via ORAL
  Filled 2015-06-29 (×3): qty 1

## 2015-06-29 MED ORDER — TECHNETIUM TC 99M DIETHYLENETRIAME-PENTAACETIC ACID: 32.3000 | Freq: Once | INTRAVENOUS | Status: DC | PRN

## 2015-06-29 MED ORDER — HEPARIN (PORCINE) IN NACL 100-0.45 UNIT/ML-% IJ SOLN
900.0000 [IU]/h | INTRAMUSCULAR | Status: AC
  Administered 2015-06-29: 1200 [IU]/h via INTRAVENOUS
  Filled 2015-06-29 (×2): qty 250

## 2015-06-29 MED ORDER — FLUCONAZOLE 200 MG PO TABS
200.0000 mg | ORAL_TABLET | Freq: Every evening | ORAL | Status: DC
  Administered 2015-06-29 – 2015-07-01 (×4): 200 mg via ORAL
  Filled 2015-06-29 (×5): qty 1

## 2015-06-29 MED ORDER — ASPIRIN 325 MG PO TABS
325.0000 mg | ORAL_TABLET | Freq: Every day | ORAL | Status: DC
  Administered 2015-06-29 – 2015-07-02 (×4): 325 mg via ORAL
  Filled 2015-06-29 (×4): qty 1

## 2015-06-29 MED ORDER — HYDROXYZINE HCL 50 MG/ML IM SOLN
25.0000 mg | Freq: Four times a day (QID) | INTRAMUSCULAR | Status: DC | PRN
  Filled 2015-06-29: qty 0.5

## 2015-06-29 MED ORDER — NICOTINE 21 MG/24HR TD PT24
21.0000 mg | MEDICATED_PATCH | Freq: Every day | TRANSDERMAL | Status: DC
  Administered 2015-07-02: 21 mg via TRANSDERMAL
  Filled 2015-06-29 (×3): qty 1

## 2015-06-29 MED ORDER — MUPIROCIN 2 % EX OINT
1.0000 "application " | TOPICAL_OINTMENT | Freq: Two times a day (BID) | CUTANEOUS | Status: DC
  Administered 2015-06-29 – 2015-07-02 (×7): 1 via NASAL
  Filled 2015-06-29 (×3): qty 22

## 2015-06-29 MED ORDER — SODIUM CHLORIDE 0.9 % IV SOLN: INTRAVENOUS | Status: DC

## 2015-06-29 MED ORDER — INSULIN ASPART 100 UNIT/ML ~~LOC~~ SOLN
5.0000 [IU] | Freq: Once | SUBCUTANEOUS | Status: AC
  Administered 2015-06-29: 5 [IU] via SUBCUTANEOUS
  Filled 2015-06-29: qty 1

## 2015-06-29 MED ORDER — SODIUM CHLORIDE 0.9 % IV BOLUS (SEPSIS)
1000.0000 mL | Freq: Once | INTRAVENOUS | Status: AC
  Administered 2015-06-29: 1000 mL via INTRAVENOUS

## 2015-06-29 MED ORDER — VANCOMYCIN HCL IN DEXTROSE 1-5 GM/200ML-% IV SOLN
1000.0000 mg | Freq: Every day | INTRAVENOUS | Status: DC
  Administered 2015-06-29 – 2015-06-30 (×2): 1000 mg via INTRAVENOUS
  Filled 2015-06-29 (×2): qty 200

## 2015-06-29 MED ORDER — MORPHINE SULFATE (PF) 2 MG/ML IV SOLN
2.0000 mg | INTRAVENOUS | Status: DC | PRN
  Administered 2015-06-29 – 2015-07-02 (×7): 2 mg via INTRAVENOUS
  Filled 2015-06-29 (×7): qty 1

## 2015-06-29 MED ORDER — METHOCARBAMOL 500 MG PO TABS
500.0000 mg | ORAL_TABLET | Freq: Four times a day (QID) | ORAL | Status: DC | PRN
  Administered 2015-06-29: 500 mg via ORAL
  Filled 2015-06-29: qty 1

## 2015-06-29 MED ORDER — BISMUTH SUBSALICYLATE 262 MG/15ML PO SUSP
30.0000 mL | Freq: Four times a day (QID) | ORAL | Status: DC | PRN
  Filled 2015-06-29: qty 118

## 2015-06-29 MED ORDER — DEXTROSE 5 % IV SOLN: 1.0000 g | Freq: Three times a day (TID) | INTRAVENOUS | Status: DC

## 2015-06-29 MED ORDER — ATORVASTATIN CALCIUM 40 MG PO TABS
40.0000 mg | ORAL_TABLET | Freq: Every day | ORAL | Status: DC
  Administered 2015-06-29 – 2015-07-01 (×3): 40 mg via ORAL
  Filled 2015-06-29 (×2): qty 1

## 2015-06-29 MED ORDER — ONDANSETRON HCL 4 MG/2ML IJ SOLN: 4.0000 mg | Freq: Once | INTRAMUSCULAR | Status: DC

## 2015-06-29 MED ORDER — DEXTROSE 50 % IV SOLN: 25.0000 mL | INTRAVENOUS | Status: DC | PRN

## 2015-06-29 MED ORDER — SODIUM CHLORIDE 0.9 % IV BOLUS (SEPSIS)
500.0000 mL | Freq: Once | INTRAVENOUS | Status: AC
  Administered 2015-06-29: 500 mL via INTRAVENOUS

## 2015-06-29 MED ORDER — VANCOMYCIN HCL IN DEXTROSE 1-5 GM/200ML-% IV SOLN: 1000.0000 mg | Freq: Every day | INTRAVENOUS | Status: DC

## 2015-06-29 MED ORDER — MORPHINE SULFATE (PF) 2 MG/ML IV SOLN: 2.0000 mg | Freq: Once | INTRAVENOUS | Status: DC

## 2015-06-29 MED ORDER — VANCOMYCIN HCL 10 G IV SOLR
2000.0000 mg | Freq: Once | INTRAVENOUS | Status: DC
  Filled 2015-06-29: qty 2000

## 2015-06-29 MED ORDER — DM-GUAIFENESIN ER 30-600 MG PO TB12
1.0000 | ORAL_TABLET | Freq: Two times a day (BID) | ORAL | Status: DC
  Administered 2015-06-29 – 2015-07-02 (×7): 1 via ORAL
  Filled 2015-06-29 (×7): qty 1

## 2015-06-29 MED ORDER — PANTOPRAZOLE SODIUM 40 MG PO TBEC
40.0000 mg | DELAYED_RELEASE_TABLET | Freq: Every day | ORAL | Status: DC
  Administered 2015-06-29 – 2015-07-02 (×4): 40 mg via ORAL
  Filled 2015-06-29 (×4): qty 1

## 2015-06-29 MED ORDER — HEPARIN BOLUS VIA INFUSION
3000.0000 [IU] | Freq: Once | INTRAVENOUS | Status: AC
  Administered 2015-06-29: 3000 [IU] via INTRAVENOUS
  Filled 2015-06-29: qty 3000

## 2015-06-29 MED ORDER — DEXTROSE 5 % IV SOLN
1.0000 g | Freq: Once | INTRAVENOUS | Status: DC
  Filled 2015-06-29: qty 1

## 2015-06-29 MED ORDER — AMITRIPTYLINE HCL 25 MG PO TABS
50.0000 mg | ORAL_TABLET | Freq: Every day | ORAL | Status: DC
  Administered 2015-06-29 – 2015-07-01 (×3): 50 mg via ORAL
  Filled 2015-06-29 (×3): qty 2

## 2015-06-29 NOTE — ED Notes (Signed)
Pt taken to [facility] for VQ scan. 

## 2015-06-29 NOTE — Progress Notes (Signed)
ANTICOAGULATION CONSULT NOTE - Follow Up Consult  Pharmacy Consult for heparin Indication: r/o PE   Labs:  Recent Labs  06/29/15 0339 06/29/15 0357 06/29/15 0847 06/29/15 1229 06/29/15 2250  HGB 10.1* 11.6*  --   --   --   HCT 32.0* 34.0*  --   --   --   PLT 358  --   --   --   --   APTT 31  --   --   --   --   LABPROT 14.7  --   --   --   --   INR 1.13  --   --   --   --   HEPARINUNFRC  --   --   --  1.04* 0.54  CREATININE 2.73* 2.70*  --   --   --   TROPONINI 0.04*  --  <0.03 <0.03  --     Assessment/Plan:  60yo female therapeutic on heparin after rate change. Will continue gtt at current rate and confirm stable with am labs.   Wynona Neat, PharmD, BCPS  06/29/2015,11:51 PM
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
## 2015-06-29 NOTE — Progress Notes (Signed)
Pharmacy Antibiotic Note  [name] is a 60 y.o. female admitted on 06/29/2015 with pneumonia.  Pharmacy has been consulted for Vancomycin and  Cefepime dosing x 8 days.  Plan: Cefepime 2gm now then 1gm IV q24h x 8 days total Vancomycin 1gm IV q24h x 8 days total Will f/u micro data, renal function, and pt's clinical condition Vanc trough prn   Height: 5\' 6"  (167.6 cm) Weight: 195 lb (88.451 kg) IBW/kg (Calculated) : 59.3  Temp (24hrs), Avg:97.9 F (36.6 C), Min:97.9 F (36.6 C), Max:97.9 F (36.6 C)   Recent Labs Lab 06/29/15 0339 06/29/15 0357 06/29/15 0358  WBC 11.4*  --   --   CREATININE 2.73* 2.70*  --   LATICACIDVEN  --   --  2.21*    Estimated Creatinine Clearance: 24.5 mL/min (by C-G formula based on Cr of 2.7).    Allergies  Allergen Reactions  . Iohexol Anaphylaxis, Swelling and Other (See Comments)      Throat swelling    . Levofloxacin In D5w Other (See Comments)    Has baseline prolonged QTc.  Avoid QTc prolonging medications.  Tresa Moore Hcl] Other (See Comments)    Has baseline prolonged QTc.  Avoid QTc prolonging medications.  . 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 Penicillins Itching    Has patient had a PCN reaction causing immediate rash, facial/tongue/throat swelling, SOB or lightheadedness with hypotension: yes Has patient had a PCN reaction causing severe rash involving mucus membranes or skin necrosis: unknown Has patient had a PCN reaction that required hospitalization : no, reacted while in the Drs office Has patient had a PCN reaction occurring within the last 10 years: yes If all of the above answers are "NO", then may proceed with Cephalosporin use.     Antimicrobials this admission: 6/18 Cefepime >>  6/18 Vanc >>   Dose adjustments this  admission: n/a  Microbiology results:  BCx x2:   UCx:    Sputum:    Thank you for allowing pharmacy to be a part of this patient's care.  Sherlon Handing, PharmD, BCPS Clinical pharmacist, pager [phone] 06/29/2015 6:03 AM

## 2015-06-29 NOTE — Progress Notes (Addendum)
PT Cancellation Note  Patient Details Name: [name] MRN: [id number] DOB: [birth year]   Cancelled Treatment:    Reason Eval/Treat Not Completed: Patient not medically ready (elevated K and awaiting troponin draw, will hold)   Duncan Dull 06/29/2015, 7:01 AM  Alben Deeds, PT DPT
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
## 2015-06-29 NOTE — Progress Notes (Addendum)
ANTICOAGULATION CONSULT NOTE - Follow Up Consult  Pharmacy Consult for heparin Indication: r/o PE  Allergies  Allergen Reactions  . Iohexol Anaphylaxis, Swelling and Other (See Comments)      Throat swelling    . Levofloxacin In D5w Other (See Comments)    Has baseline prolonged QTc.  Avoid QTc prolonging medications.  Tresa Moore Hcl] Other (See Comments)    Has baseline prolonged QTc.  Avoid QTc prolonging medications.  . 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 Penicillins Itching    Has patient had a PCN reaction causing immediate rash, facial/tongue/throat swelling, SOB or lightheadedness with hypotension: yes Has patient had a PCN reaction causing severe rash involving mucus membranes or skin necrosis: unknown Has patient had a PCN reaction that required hospitalization : no, reacted while in the Drs office Has patient had a PCN reaction occurring within the last 10 years: yes If all of the above answers are "NO", then may proceed with Cephalosporin use.     Patient Measurements: Height: 5\' 6"  (167.6 cm) Weight: 195 lb (88.451 kg) IBW/kg (Calculated) : 59.3 Heparin Dosing Weight: 80  Vital Signs: Temp: 97.9 F (36.6 C) (06/18 1228) Temp Source: Oral (06/18 1228) BP: 98/66 mmHg (06/18 1228) Pulse Rate: 110 (06/18 0815)  Labs:  Recent Labs  06/29/15 0339 06/29/15 0357 06/29/15 0847 06/29/15 1229  HGB 10.1* 11.6*  --   --   HCT 32.0* 34.0*  --   --   PLT 358  --   --   --   APTT 31  --   --   --   LABPROT 14.7  --   --   --   INR 1.13  --   --   --   HEPARINUNFRC  --   --   --  1.04*  CREATININE 2.73* 2.70*  --   --   TROPONINI 0.04*  --  <0.03 <0.03    Estimated Creatinine Clearance: 24.5 mL/min (by C-G formula based on Cr of 2.7).   Medications:   Scheduled:  . amitriptyline  50 mg Oral QHS  . aspirin  325 mg Oral Daily  . atorvastatin  40 mg Oral q1800  . budesonide  0.25 mg Inhalation BID  . [START ON 06/30/2015] ceFEPime (MAXIPIME) IV  1 g Intravenous Q24H  . dextromethorphan-guaiFENesin  1 tablet Oral BID  . ferrous sulfate  325 mg Oral Q breakfast  . fluconazole  200 mg Oral QPM  . liver oil-zinc oxide   Topical BID  . nicotine  21 mg Transdermal Daily  . nystatin   Topical BID  . oxybutynin  5 mg Oral BID  . pantoprazole  40 mg Oral Daily  . vancomycin  1,000 mg Intravenous Q0600    Assessment: 60 yo female with c/o CP and dizziness, concern for PE and was started on heparin. HL scheduled for 1400, it was drawn at 1200 (~6 hours after bolus and start of the infusion) - With patients renal function should have drawn the level 8 hours later. Will go ahead and dose reduce based on this level (significantly supra-therapeutic at 1.04) and recheck in 8 hours.  Goal of Therapy:  Heparin level 0.3-0.7 units/ml Monitor platelets by anticoagulation protocol: Yes   Plan:  Hold heparin for  1 hour Start heparin infusion at 950 units/hr Check anti-Xa level in 8 hours and daily while on heparin Continue to monitor H&H and platelets   Melburn Popper, PharmD Clinical Pharmacy Resident Pager: [phone] 06/29/2015 3:25 PM
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
## 2015-06-30 ENCOUNTER — Inpatient Hospital Stay (HOSPITAL_COMMUNITY): Payer: Medicaid Other

## 2015-06-30 DIAGNOSIS — R079 Chest pain, unspecified: Secondary | ICD-10-CM

## 2015-06-30 DIAGNOSIS — I2699 Other pulmonary embolism without acute cor pulmonale: Secondary | ICD-10-CM

## 2015-06-30 LAB — CBC
HEMATOCRIT: 31 % — AB (ref 36.0–46.0)
Hemoglobin: 9.4 g/dL — ABNORMAL LOW (ref 12.0–15.0)
MCH: 27.2 pg (ref 26.0–34.0)
MCHC: 30.3 g/dL (ref 30.0–36.0)
MCV: 89.6 fL (ref 78.0–100.0)
PLATELETS: 292 10*3/uL (ref 150–400)
RBC: 3.46 MIL/uL — AB (ref 3.87–5.11)
RDW: 15.2 % (ref 11.5–15.5)
WBC: 6.8 10*3/uL (ref 4.0–10.5)

## 2015-06-30 LAB — ECHOCARDIOGRAM COMPLETE
CHL CUP MV DEC (S): 194
EERAT: 7.6
EWDT: 194 ms
FS: 28 % (ref 28–44)
Height: 66 in
IVS/LV PW RATIO, ED: 0.98
LA ID, A-P, ES: 35 mm
LA diam end sys: 35 mm
LA diam index: 1.78 cm/m2
LA vol A4C: 37.6 ml
LA vol: 31.2 mL
LAVOLIN: 15.9 mL/m2
LV E/e' medial: 7.6
LV PW d: 12.4 mm — AB (ref 0.6–1.1)
LV TDI E'LATERAL: 9.9
LV TDI E'MEDIAL: 7.07
LVEEAVG: 7.6
LVELAT: 9.9 cm/s
LVOT area: 2.84 cm2
LVOTD: 19 mm
MV Peak grad: 2 mmHg
MV pk A vel: 90.7 m/s
MV pk E vel: 75.2 m/s
Weight: 2864 oz

## 2015-06-30 LAB — TSH: TSH: 0.551 u[IU]/mL (ref 0.350–4.500)

## 2015-06-30 LAB — URINE CULTURE

## 2015-06-30 LAB — LIPID PANEL
CHOLESTEROL: 134 mg/dL (ref 0–200)
HDL: 30 mg/dL — AB (ref >40)
LDL Cholesterol: 78 mg/dL (ref 0–99)
TRIGLYCERIDES: 130 mg/dL (ref <150)
Total CHOL/HDL Ratio: 4.5 RATIO
VLDL: 26 mg/dL (ref 0–40)

## 2015-06-30 LAB — LEGIONELLA PNEUMOPHILA SEROGP 1 UR AG: L. PNEUMOPHILA SEROGP 1 UR AG: NEGATIVE

## 2015-06-30 LAB — BASIC METABOLIC PANEL
ANION GAP: 7 (ref 5–15)
BUN: 12 mg/dL (ref 6–20)
CO2: 18 mmol/L — AB (ref 22–32)
Calcium: 8.3 mg/dL — ABNORMAL LOW (ref 8.9–10.3)
Chloride: 112 mmol/L — ABNORMAL HIGH (ref 101–111)
Creatinine, Ser: 1.12 mg/dL — ABNORMAL HIGH (ref 0.44–1.00)
GFR calc Af Amer: >60 mL/min (ref >60)
GFR calc non Af Amer: 52 mL/min — ABNORMAL LOW (ref >60)
GLUCOSE: 93 mg/dL (ref 65–99)
POTASSIUM: 4.7 mmol/L (ref 3.5–5.1)
Sodium: 137 mmol/L (ref 135–145)

## 2015-06-30 LAB — HEMOGLOBIN A1C
Hgb A1c MFr Bld: 6.3 % — ABNORMAL HIGH (ref 4.8–5.6)
MEAN PLASMA GLUCOSE: 134 mg/dL

## 2015-06-30 LAB — HEPARIN LEVEL (UNFRACTIONATED): HEPARIN UNFRACTIONATED: 0.7 [IU]/mL (ref 0.30–0.70)

## 2015-06-30 MED ORDER — PERFLUTREN LIPID MICROSPHERE
1.0000 mL | INTRAVENOUS | Status: AC | PRN
  Filled 2015-06-30: qty 10

## 2015-06-30 MED ORDER — MAGIC MOUTHWASH
5.0000 mL | Freq: Four times a day (QID) | ORAL | Status: DC | PRN
  Administered 2015-06-30 – 2015-07-01 (×2): 5 mL via ORAL
  Filled 2015-06-30 (×3): qty 5

## 2015-06-30 MED ORDER — APIXABAN 5 MG PO TABS
10.0000 mg | ORAL_TABLET | Freq: Two times a day (BID) | ORAL | Status: DC
  Administered 2015-06-30 – 2015-07-02 (×4): 10 mg via ORAL
  Filled 2015-06-30 (×4): qty 2

## 2015-06-30 MED ORDER — PERFLUTREN LIPID MICROSPHERE
INTRAVENOUS | Status: AC
  Administered 2015-06-30: 3 mL
  Filled 2015-06-30: qty 10

## 2015-06-30 MED ORDER — APIXABAN 5 MG PO TABS: 5.0000 mg | ORAL_TABLET | Freq: Two times a day (BID) | ORAL | Status: DC

## 2015-06-30 MED ORDER — DEXTROSE 5 % IV SOLN
1.0000 g | Freq: Three times a day (TID) | INTRAVENOUS | Status: DC
  Administered 2015-06-30 – 2015-07-02 (×6): 1 g via INTRAVENOUS
  Filled 2015-06-30 (×8): qty 1

## 2015-06-30 MED ORDER — CALCIUM CARBONATE ANTACID 500 MG PO CHEW
1.0000 | CHEWABLE_TABLET | Freq: Three times a day (TID) | ORAL | Status: DC
  Administered 2015-06-30 – 2015-07-02 (×6): 200 mg via ORAL
  Filled 2015-06-30 (×7): qty 1

## 2015-06-30 MED ORDER — NYSTATIN 100000 UNIT/ML MT SUSP
5.0000 mL | Freq: Four times a day (QID) | OROMUCOSAL | Status: DC
  Administered 2015-06-30 – 2015-07-02 (×9): 500000 [IU] via ORAL
  Filled 2015-06-30 (×8): qty 5

## 2015-06-30 MED ORDER — VANCOMYCIN HCL IN DEXTROSE 750-5 MG/150ML-% IV SOLN
750.0000 mg | Freq: Two times a day (BID) | INTRAVENOUS | Status: DC
  Administered 2015-06-30 – 2015-07-02 (×4): 750 mg via INTRAVENOUS
  Filled 2015-06-30 (×6): qty 150

## 2015-06-30 NOTE — Progress Notes (Signed)
ANTICOAGULATION CONSULT NOTE - Follow Up Consult  Pharmacy Consult for Heparin + Vanco/Cefepime Indication: r/o PE + sepsis/PNA/UTI  Allergies  Allergen Reactions  . Iohexol Anaphylaxis, Swelling and Other (See Comments)      Throat swelling    . Levofloxacin In D5w Other (See Comments)    Has baseline prolonged QTc.  Avoid QTc prolonging medications.  Tresa Moore Hcl] Other (See Comments)    Has baseline prolonged QTc.  Avoid QTc prolonging medications.  . 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 Penicillins Itching    Has patient had a PCN reaction causing immediate rash, facial/tongue/throat swelling, SOB or lightheadedness with hypotension: yes Has patient had a PCN reaction causing severe rash involving mucus membranes or skin necrosis: unknown Has patient had a PCN reaction that required hospitalization : no, reacted while in the Drs office Has patient had a PCN reaction occurring within the last 10 years: yes If all of the above answers are "NO", then may proceed with Cephalosporin use.     Patient Measurements: Height: 5\' 6"  (167.6 cm) Weight: 179 lb (81.194 kg) IBW/kg (Calculated) : 59.3   Vital Signs: Temp: 99 F (37.2 C) (06/19 1158) Temp Source: Oral (06/19 1158) BP: 121/67 mmHg (06/19 1100) Pulse Rate: 100 (06/19 1158)  Labs:  Recent Labs  06/29/15 0339 06/29/15 0357 06/29/15 0847 06/29/15 1229 06/29/15 2250 06/30/15 0518  HGB 10.1* 11.6*  --   --   --  9.4*  HCT 32.0* 34.0*  --   --   --  31.0*  PLT 358  --   --   --   --  292  APTT 31  --   --   --   --   --   LABPROT 14.7  --   --   --   --   --   INR 1.13  --   --   --   --   --   HEPARINUNFRC  --   --   --  1.04* 0.54 0.70  CREATININE 2.73* 2.70*  --   --   --  1.12*  TROPONINI 0.04*  --   <0.03 <0.03  --   --     Estimated Creatinine Clearance: 56.7 mL/min (by C-G formula based on Cr of 1.12).   Assessment:  Anticoagulation: heparin for r/o PE. D-dimer was elevated to 1.67.. VQ indeterminate. Dopplers negative for DVT.(Recent R ankle fx) HL 0.54, 0.7. Hgb down to 9.4. Plts 358>292  Infectious Disease: Vanc/Cefepime for PNA D#1/8 on CXR. UA positive for UTI.. WBC 6.8 down. Scr down. LA WNL. Indication for daily home fluconazole? *NOTE: Pt has allergy listed as "itching" to heparin, felt this was okay to test. - called and nurse said no reaction to IV*  6/18 Cefepime >> 6/25 6/18 Vanc >> 6/25 Fluconazole po (home med)   BCx x2:   UCx:  multiple species  Sputum:   Goal of Therapy:  Vanco trough 15-20 Heparin level 0.3-0.7 units/ml Monitor platelets by anticoagulation protocol: Yes   Plan:  Cefepime 1g IV q8hr x 8d Vancomycin 750mg  IV q12h x 8 days total Decrease IV heparin to 900 units/hr F/u plans for oral anticoagulation   Jerome Viglione S. Alford Highland, PharmD, BCPS Clinical Staff Pharmacist Pager [phone]  Eilene Ghazi  Stillinger 06/30/2015,1:30 PM

## 2015-06-30 NOTE — Progress Notes (Signed)
Echocardiogram 2D Echocardiogram with Definity has been performed.  Tresa Res 06/30/2015, 1:47 PM

## 2015-06-30 NOTE — Progress Notes (Signed)
Preliminary results by tech - Venous Duplex Lower Ext. Completed. Negative for deep and superficial vein thrombosis in both legs.  Nareh Matzke, BS, RDMS, RVT  
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
## 2015-06-30 NOTE — Evaluation (Signed)
Physical Therapy Evaluation Patient Details Name: [name] MRN: [id number] DOB: 04/27/1955 Today's Date: 06/30/2015   History of Present Illness  60 y.o. female with medical history significant of hypertension, hyperlipidemia, COPD, GERD, depression, former smoker (quit smoking 1 month ago), cocaine abuse, rheumatoid arthritis, CKD-III, recent right ankle fracture, who presents with chest pain, dizziness, hypotension, cough, shortness of breath, abdominal pain, increased urinary frequency.  Clinical Impression  Patient presents with decreased mobility due to deficits listed in PT problem list.  Reports some difficulty with urination pressing on her bladder to empty.  Also with continued numbness in extremities in trunk.  Feel she will need continued rehab in SNF setting until safe for return home with sister/family support.  Remains high fall risk, chair alarm in place and reminded to call staff for assist.     Follow Up Recommendations SNF    Equipment Recommendations  None recommended by PT    Recommendations for Other Services       Precautions / Restrictions Precautions Precautions: Fall Required Braces or Orthoses: Other Brace/Splint Other Brace/Splint: camboot, but not here (at nursing facility) Restrictions Weight Bearing Restrictions: Yes Other Position/Activity Restrictions: unsure of WBS RLE. States she was using boot at SNF and walking in parralell bars      Mobility  Bed Mobility Overal bed mobility: Needs Assistance Bed Mobility: Supine to Sit     Supine to sit: Supervision     General bed mobility comments: increased time  Transfers Overall transfer level: Needs assistance Equipment used: None Transfers: Sit to/from [employer] Transfers Sit to Stand: Min assist Stand pivot transfers: Min assist       General transfer comment: noted some ataxia with standing tasks, initially pivot to [facility], then to recliner, then sit to stand from recliner for  placing chair alarm  Ambulation/Gait             General Gait Details: deferred due to unknown weight bearing and cam boot not here  Stairs            Wheelchair Mobility    Modified Rankin (Stroke Patients Only)       Balance Overall balance assessment: Needs assistance;History of Falls Sitting-balance support: Feet supported Sitting balance-Leahy Scale: Good     Standing balance support: During functional activity;Single extremity supported Standing balance-Leahy Scale: Poor Standing balance comment: holds onto [facility] with transfers, min support at least for safety with some ataxia noted in standing                             Pertinent Vitals/Pain Pain Assessment: No/denies pain    Home Living Family/patient expects to be discharged to:: Skilled nursing facility                      Prior Function Level of [place]: Needs assistance   Gait / Transfers Assistance Needed: used w/c at SNF for mobility  ADL's / Homemaking Assistance Needed: States she was doing her own bathing and dressing at SNF        Hand Dominance   Dominant Hand: Right    Extremity/Trunk Assessment   Upper Extremity Assessment: Defer to OT evaluation (reports numbness throughout extremities, neuropathic pain along volar aspect of forearms)           Lower Extremity Assessment: Generalized weakness (reports numbness in LE's and trunk)      Cervical / Trunk Assessment: Normal  Communication  Communication: No difficulties  Cognition Arousal/Alertness: Awake/alert Behavior During Therapy: WFL for tasks assessed/performed Overall Cognitive Status: Within Functional Limits for tasks assessed                      General Comments      Exercises        Assessment/Plan    PT Assessment Patient needs continued PT services  PT Diagnosis Abnormality of gait;Generalized weakness   PT Problem List Decreased strength;Decreased  coordination;Decreased activity tolerance;Impaired sensation;Decreased knowledge of use of DME;Decreased mobility;Decreased balance;Decreased safety awareness  PT Treatment Interventions DME instruction;Gait training;Balance training;Functional mobility training;Patient/family education;Therapeutic activities;Therapeutic exercise;Wheelchair mobility training   PT Goals (Current goals can be found in the Care Plan section) Acute Rehab PT Goals Patient Stated Goal: to return to living independently again PT Goal Formulation: With patient Time For Goal Achievement: 07/14/15 Potential to Achieve Goals: Good    Frequency Min 2X/week   Barriers to discharge        Co-evaluation PT/OT/SLP Co-Evaluation/Treatment: Yes Reason for Co-Treatment: For patient/therapist safety PT goals addressed during session: Balance;Mobility/safety with mobility         End of Session   Activity Tolerance: Patient tolerated treatment well Patient left: in chair;with call bell/phone within reach;with chair alarm set           Time: [id number] PT Time Calculation (min) (ACUTE ONLY): 30 min   Charges:   PT Evaluation $PT Eval Moderate Complexity: 1 Procedure     PT G CodesReginia Naas 07/11/15, 11:34 AM  Magda Kiel, [place] July 11, 2015

## 2015-06-30 NOTE — Progress Notes (Signed)
ANTICOAGULATION CONSULT NOTE - Follow Up Consult  Pharmacy Consult for Eliquis Indication: PE  Allergies  Allergen Reactions  . Iohexol Anaphylaxis, Swelling and Other (See Comments)      Throat swelling    . Levofloxacin In D5w Other (See Comments)    Has baseline prolonged QTc.  Avoid QTc prolonging medications.  Tresa Moore Hcl] Other (See Comments)    Has baseline prolonged QTc.  Avoid QTc prolonging medications.  . 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 Penicillins Itching    Has patient had a PCN reaction causing immediate rash, facial/tongue/throat swelling, SOB or lightheadedness with hypotension: yes Has patient had a PCN reaction causing severe rash involving mucus membranes or skin necrosis: unknown Has patient had a PCN reaction that required hospitalization : no, reacted while in the Drs office Has patient had a PCN reaction occurring within the last 10 years: yes If all of the above answers are "NO", then may proceed with Cephalosporin use.     Patient Measurements: Height: 5\' 6"  (167.6 cm) Weight: 179 lb (81.194 kg) IBW/kg (Calculated) : 59.3   Vital Signs: Temp: 97.4 F (36.3 C) (06/19 1500) Temp Source: Oral (06/19 1158) BP: 121/67 mmHg (06/19 1100) Pulse Rate: 100 (06/19 1158)  Labs:  Recent Labs  06/29/15 0339 06/29/15 0357 06/29/15 0847 06/29/15 1229 06/29/15 2250 06/30/15 0518  HGB 10.1* 11.6*  --   --   --  9.4*  HCT 32.0* 34.0*  --   --   --  31.0*  PLT 358  --   --   --   --  292  APTT 31  --   --   --   --   --   LABPROT 14.7  --   --   --   --   --   INR 1.13  --   --   --   --   --   HEPARINUNFRC  --   --   --  1.04* 0.54 0.70  CREATININE 2.73* 2.70*  --   --   --  1.12*  TROPONINI 0.04*  --  <0.03 <0.03  --   --     Estimated  Creatinine Clearance: 56.7 mL/min (by C-G formula based on Cr of 1.12).   Assessment:  Anticoagulation: heparin for r/o PE. D-dimer was elevated to 1.67.. VQ indeterminate. Dopplers negative for DVT.(Recent R ankle fx) HL 0.54, 0.7. Hgb down to 9.4. Plts 358>292. Change IV heparin to Eliquis.   Goal of Therapy:  Vanco trough 15-20 Heparin level 0.3-0.7 units/ml Monitor platelets by anticoagulation protocol: Yes   Plan:  D/c IV heparin Eliquis 10mg  BID x 10d, then 5mg  BID   Janeil Schexnayder S. Alford Highland, PharmD, BCPS Clinical Staff Pharmacist Pager [phone]  Eilene Ghazi Stillinger 06/30/2015,3:34 PM

## 2015-06-30 NOTE — Care Management Note (Signed)
Case Management Note  Patient Details  Name: Shardasia Bevington MRN: [id number] Date of Birth: 04/21/1955  Subjective/Objective: Pt in for sepsis with hypotension (systolic blood pressure in the 70s), WBC of 11.4, lactic acid of 2.6, with mildly elevated troponin, potassium of 6 (no EKG changes) and worsened renal function. Chest x-ray showed focal opacity over right lung base. CT of the abdomen and pelvis showed mild right hydronephrosis and hydroureter, fatty infiltration of the pancreas and small hiatal hernia. D-dimer was elevated to 1.67. UA positive for UTI. Patient started on empiric antibiotics, IV heparin for possible PE. Pt is from [place].       Action/Plan: CSW made aware that pt is from SNF and the plan is to return once stable. No further needs from CM at this time.    Expected Discharge Date:                  Expected Discharge Plan:  Skilled Nursing Facility  In-House Referral:  Clinical Social Work  Discharge planning Services  CM Consult  Post Acute Care Choice:  NA Choice offered to:  NA  DME Arranged:  N/A DME Agency:  NA  HH Arranged:  NA HH Agency:  NA  Status of Service:  Completed, signed off  Medicare Important Message Given:    Date Medicare IM Given:    Medicare IM give by:    Date Additional Medicare IM Given:    Additional Medicare Important Message give by:     If discussed at [place] of Stay Meetings, dates discussed:    Additional Comments:  Bethena Roys, RN 06/30/2015, 11:12 AM

## 2015-06-30 NOTE — Progress Notes (Signed)
Occupational Therapy Evaluation Patient Details Name: [name] MRN: [id number] DOB: Aug 17, 1955 Today's Date: 06/30/2015    History of Present Illness 60 y.o. female with medical history significant of hypertension, hyperlipidemia, COPD, GERD, depression, former smoker (quit smoking 1 month ago), cocaine abuse, rheumatoid arthritis, CKD-III, recent right ankle fracture, who presents with chest pain, dizziness, hypotension, cough, shortness of breath, abdominal pain, increased urinary frequency.   Clinical Impression   PTA, pt was at [facility] for rehab. Prior to illness, pt lived at home independently. Pt plans to return to SNF for continued rehab. Will follow acutely to maximize functional level of independence with ADL and mobility. Pt will benefit form thera-tubing for utensils, etc due to B hand weakness/numbness.     Follow Up Recommendations  SNF;Supervision/Assistance - 24 hour    Equipment Recommendations  None recommended by OT    Recommendations for Other Services       Precautions / Restrictions Precautions Precautions: Fall Required Braces or Orthoses: Other Brace/Splint (boot for ankle but not in hospital) Restrictions Weight Bearing Restrictions: Yes Other Position/Activity Restrictions: unsure of WBS RLE. States she was using boot at SNF and walking in parralell bars      Mobility Bed Mobility Overal bed mobility: Needs Assistance Bed Mobility: Supine to Sit     Supine to sit: Supervision        Transfers Overall transfer level: Needs assistance   Transfers: Sit to/from Stand;Stand Pivot Transfers Sit to Stand: Min assist Stand pivot transfers: Min assist            Balance Overall balance assessment: Needs assistance;History of Falls Sitting-balance support: Feet supported Sitting balance-Leahy Scale: Good       Standing balance-Leahy Scale: Poor                              ADL Overall ADL's : Needs  assistance/impaired Eating/Feeding: Set up Eating/Feeding Details (indicate cue type and reason): A to open containers Grooming: Set up;Oral care   Upper Body Bathing: Set up;Sitting   Lower Body Bathing: Minimal assistance;Sit to/from stand   Upper Body Dressing : Set up;Sitting   Lower Body Dressing: Minimal assistance;Sit to/from stand Lower Body Dressing Details (indicate cue type and reason): difficulty with donning R sock Toilet Transfer: Minimal assistance;BSC;Stand-pivot;Cueing for safety   Toileting- Clothing Manipulation and Hygiene: Min guard;Sitting/lateral lean       Functional mobility during ADLs: Minimal assistance (stand pivot only)       Vision  wears glasses   Perception     Praxis      Pertinent Vitals/Pain Pain Assessment: No/denies pain     Hand Dominance Right   Extremity/Trunk Assessment Upper Extremity Assessment Upper Extremity Assessment: Generalized weakness (C/o pain and numbness B hands  - not acute. decreased grip)   Lower Extremity Assessment Lower Extremity Assessment: Defer to PT evaluation   Cervical / Trunk Assessment Cervical / Trunk Assessment: Normal   Communication Communication Communication: No difficulties   Cognition Arousal/Alertness: Awake/alert Behavior During Therapy: WFL for tasks assessed/performed Overall Cognitive Status: Within Functional Limits for tasks assessed                     General Comments       Exercises       Shoulder Instructions      Home Living Family/patient expects to be discharged to:: Skilled nursing facility  Prior Functioning/Environment Level of Independence: Needs assistance  Gait / Transfers Assistance Needed: used w/c at SNF for mobility ADL's / Homemaking Assistance Needed: States she was doing her own bathing and dressing at SNF        OT Diagnosis: Generalized weakness;Acute pain   OT Problem List:  Decreased strength;Decreased activity tolerance;Impaired balance (sitting and/or standing);Decreased coordination;Decreased knowledge of use of DME or AE;Impaired UE functional use;Pain;Cardiopulmonary status limiting activity   OT Treatment/Interventions: Self-care/ADL training;Therapeutic exercise;Energy conservation;DME and/or AE instruction;Therapeutic activities;Patient/family education;Balance training    OT Goals(Current goals can be found in the care plan section) Acute Rehab OT Goals Patient Stated Goal: to return to living independently again OT Goal Formulation: With patient Time For Goal Achievement: 07/14/15 Potential to Achieve Goals: Good ADL Goals Pt Will Perform Eating: with modified independence;with adaptive utensils (tubing) Pt Will Perform Grooming: with modified independence Pt Will Perform Lower Body Bathing: with set-up;with supervision;sit to/from stand;sitting/lateral leans Pt Will Transfer to Toilet: with supervision;bedside commode;stand pivot transfer Pt/caregiver will Perform Home Exercise Program: Increased strength;Both right and left upper extremity;With written HEP provided;With theraputty (fine motor/ccordination/strengthening)  OT Frequency: Min 2X/week   Barriers to D/C:            Co-evaluation PT/OT/SLP Co-Evaluation/Treatment: Yes Reason for Co-Treatment: For patient/therapist safety          End of Session Equipment Utilized During Treatment: Gait belt Nurse Communication: Mobility status  Activity Tolerance: Patient tolerated treatment well Patient left: in chair;with call bell/phone within reach;with chair alarm set   Time: [id number] OT Time Calculation (min): 26 min Charges:  OT General Charges $OT Visit: 1 Procedure OT Evaluation $OT Eval Moderate Complexity: 1 Procedure G-Codes:    Salsabeel Gorelick,HILLARY 07/23/2015, 11:21 AM   Maurie Boettcher, OTR/L  [phone] 07/23/2015

## 2015-06-30 NOTE — NC FL2 (Signed)
[place] MEDICAID FL2 LEVEL OF CARE SCREENING TOOL     IDENTIFICATION  Patient Name: [name] Birthdate: [birth year] Sex: female Admission Date (Current Location): 06/29/2015  [facility] and [place] Number:  Herbalist and Address:  [facility] [place], [place] [address] [place],  [address]      Provider Number: [id number]  Attending Physician Name and Address:  Louellen Molder, MD  Relative Name and Phone Number:       Current Level of Care: Hospital Recommended Level of Care: [place] Prior Approval Number:    Date Approved/Denied:   PASRR Number:    Discharge Plan: SNF    Current Diagnoses: Patient Active Problem List   Diagnosis Date Noted  . Hydronephrosis, right 06/29/2015  . Hyperkalemia 06/29/2015  . HCAP (healthcare-associated pneumonia) 06/29/2015  . Peripheral neuropathy ([place]) 06/29/2015  . Candida infection, oral 06/28/2015  . Anemia due to other cause 06/10/2015  . Hypoalbuminemia due to protein-calorie malnutrition ([place]) 06/10/2015  . Ankle fracture, right 06/03/2015  . Severe sepsis ([place]) 05/29/2015  . Spinal stenosis in cervical region 05/12/2015  . S/P cervical spinal fusion 04/17/2015  . Acute cystitis without hematuria   . Vomiting   . Constipation 02/17/2015  . CAP (community acquired pneumonia) 02/15/2015  . Encephalopathy, metabolic [id number]  . Cocaine abuse with cocaine-induced disorder ([place]) 02/14/2015  . Sepsis ([place]) 10/28/2014  . SIRS (systemic inflammatory response syndrome) ([place]) 10/28/2014  . Sore throat 10/28/2014  . Acute encephalopathy 10/28/2014  . Metabolic acidosis   . Leukocytosis   . Hypotension 09/10/2014  . Dehydration 09/10/2014  . Chest pain at rest 09/09/2014  . Tobacco abuse 09/09/2014  . HLD (hyperlipidemia) 09/09/2014  . COPD (chronic obstructive pulmonary disease) ([place]) 09/09/2014  . GERD (gastroesophageal reflux disease) 09/09/2014  . Chest pain 08/20/2014   . Nausea & vomiting 08/20/2014  . Acute renal failure superimposed on stage 3 chronic kidney disease ([place]) 08/20/2014  . UTI (lower urinary tract infection) 08/20/2014  . Pain in the chest   . Pain 02/11/2013  . Cocaine abuse 02/11/2013  . Rheumatoid arthritis flare ([place]) 02/11/2013  . Acute renal failure ([place]) 02/11/2013  . AKI (acute kidney injury) ([place]) 02/11/2013  . Hiatal hernia with gastroesophageal reflux 10/11/2012  . Abdominal mass 10/08/2012  . Knee pain 10/08/2012  . HTN (hypertension), benign 10/08/2012  . Pancreatic mass 10/07/2012  . Abdominal pain 10/07/2012  . Rheumatoid arthritis ([place])     Orientation RESPIRATION BLADDER Height & Weight     Self, Time, Situation, Place  Normal Continent Weight: 179 lb (81.194 kg) Height:  5\' 6"  (167.6 cm)  BEHAVIORAL SYMPTOMS/MOOD NEUROLOGICAL BOWEL NUTRITION STATUS      Continent Diet  AMBULATORY STATUS COMMUNICATION OF NEEDS Skin   Extensive Assist Verbally                         Personal Care Assistance Level of Assistance  Bathing, Feeding, Dressing Bathing Assistance: Limited assistance Feeding assistance: Independent Dressing Assistance: Limited assistance     Functional Limitations Info  Sight, Hearing, Speech Sight Info: Adequate Hearing Info: Adequate Speech Info: Adequate    SPECIAL CARE FACTORS FREQUENCY  OT (By licensed OT), PT (By licensed PT)     PT Frequency:  (5x/week) OT Frequency:  (5x/week)            Contractures Contractures Info: Not present    Additional Factors Info  Isolation Precautions, Psychotropic Code Status Info:  (  full code) Allergies Info:  (lohexol, levofloxacin in d5w, zofran, heparin, penicillins, pork-derived products) Psychotropic Info:  (Elavil)   Isolation Precautions Info:  (contact percautions hx of MRSA)     Current Medications (06/30/2015):  This is the current hospital active medication list Current Facility-Administered Medications  Medication Dose  Route Frequency Provider Last Rate Last Dose  . 0.9 %  sodium chloride infusion   Intravenous Continuous Ivor Costa, MD 100 mL/hr at 06/30/15 1742    . albuterol (PROVENTIL) (2.5 MG/3ML) 0.083% nebulizer solution 2.5 mg  2.5 mg Nebulization Q4H PRN Ivor Costa, MD      . amitriptyline (ELAVIL) tablet 50 mg  50 mg Oral QHS Ivor Costa, MD   50 mg at 06/29/15 2213  . apixaban (ELIQUIS) tablet 10 mg  10 mg Oral Q12H Karren Cobble, RPH   10 mg at 06/30/15 1633  . [START ON 07/07/2015] apixaban (ELIQUIS) tablet 5 mg  5 mg Oral Q12H Crystal White, [facility]      . aspirin tablet 325 mg  325 mg Oral Daily Ivor Costa, MD   325 mg at 06/30/15 0829  . atorvastatin (LIPITOR) tablet 40 mg  40 mg Oral q1800 Ivor Costa, MD   40 mg at 06/30/15 1800  . bismuth subsalicylate (PEPTO BISMOL) 262 MG/15ML suspension 30 mL  30 mL Oral Q6H PRN Ivor Costa, MD      . budesonide (PULMICORT) nebulizer solution 0.25 mg  0.25 mg Inhalation BID Ivor Costa, MD   0.25 mg at 06/30/15 1130  . calcium carbonate (TUMS - dosed in mg elemental calcium) chewable tablet 200 mg of elemental calcium  1 tablet Oral TID WC Nishant Dhungel, MD   200 mg of elemental calcium at 06/30/15 1200  . ceFEPIme (MAXIPIME) 1 g in dextrose 5 % 50 mL IVPB  1 g Intravenous Q8H Crystal Trellis Moment, RPH   1 g at 06/30/15 1340  . Chlorhexidine Gluconate Cloth 2 % PADS 6 each  6 each Topical Q0600 Nishant Dhungel, MD   6 each at 06/30/15 0600  . dextromethorphan-guaiFENesin (MUCINEX DM) 30-600 MG per 12 hr tablet 1 tablet  1 tablet Oral BID Ivor Costa, MD   1 tablet at 06/30/15 0829  . ferrous sulfate tablet 325 mg  325 mg Oral Q breakfast Ivor Costa, MD   325 mg at 06/30/15 0829  . fluconazole (DIFLUCAN) tablet 200 mg  200 mg Oral QPM Ivor Costa, MD   200 mg at 06/30/15 1800  . hydrOXYzine (VISTARIL) injection 25 mg  25 mg Intramuscular Q6H PRN Ivor Costa, MD      . liver oil-zinc oxide (DESITIN) 40 % ointment   Topical BID Ivor Costa, MD      . magic mouthwash  5 mL  Oral QID PRN Nishant Dhungel, MD   5 mL at 06/30/15 0853  . methocarbamol (ROBAXIN) tablet 500 mg  500 mg Oral Q6H PRN Ivor Costa, MD   500 mg at 06/29/15 1300  . morphine 2 MG/ML injection 2 mg  2 mg Intravenous Q4H PRN Ivor Costa, MD   2 mg at 06/30/15 1633  . mupirocin ointment (BACTROBAN) 2 % 1 application  1 application Nasal BID Louellen Molder, MD   1 application at 0000000 0829  . nicotine (NICODERM CQ - dosed in mg/24 hours) patch 21 mg  21 mg Transdermal Daily Ivor Costa, MD   21 mg at 06/29/15 1000  . nystatin (MYCOSTATIN) 100000 UNIT/ML suspension 500,000 Units  5 mL Oral  QID Louellen Molder, MD   500,000 Units at 06/30/15 1632  . nystatin (MYCOSTATIN/NYSTOP) topical powder   Topical BID Ivor Costa, MD      . oxybutynin (DITROPAN) tablet 5 mg  5 mg Oral BID Ivor Costa, MD   5 mg at 06/30/15 0829  . pantoprazole (PROTONIX) EC tablet 40 mg  40 mg Oral Daily Ivor Costa, MD   40 mg at 06/30/15 0829  . polyethylene glycol (MIRALAX / GLYCOLAX) packet 17 g  17 g Oral Daily PRN Ivor Costa, MD      . technetium TC 60M diethylenetriame-pentaacetic acid (DTPA) injection 32.3 milli Curie  32.3 milli Curie Inhalation Once PRN Corrie Mckusick, DO      . vancomycin (VANCOCIN) IVPB 750 mg/150 ml premix  750 mg Intravenous Q12H Karren Cobble, RPH   750 mg at 06/30/15 1633     Discharge Medications: Please see discharge summary for a list of discharge medications.  Relevant Imaging Results:  Relevant Lab Results:   Additional Information  ([id number] ssn)  Kathlen Sakurai M, LCSW

## 2015-07-01 ENCOUNTER — Encounter (HOSPITAL_COMMUNITY): Payer: Self-pay | Admitting: General Practice

## 2015-07-01 DIAGNOSIS — R0789 Other chest pain: Secondary | ICD-10-CM

## 2015-07-01 LAB — GLUCOSE, CAPILLARY: GLUCOSE-CAPILLARY: 92 mg/dL (ref 65–99)

## 2015-07-01 LAB — CBC
HCT: 27.5 % — ABNORMAL LOW (ref 36.0–46.0)
HEMOGLOBIN: 8.7 g/dL — AB (ref 12.0–15.0)
MCH: 27.7 pg (ref 26.0–34.0)
MCHC: 31.6 g/dL (ref 30.0–36.0)
MCV: 87.6 fL (ref 78.0–100.0)
PLATELETS: 272 10*3/uL (ref 150–400)
RBC: 3.14 MIL/uL — ABNORMAL LOW (ref 3.87–5.11)
RDW: 14.9 % (ref 11.5–15.5)
WBC: 5.8 10*3/uL (ref 4.0–10.5)

## 2015-07-01 LAB — HEPARIN LEVEL (UNFRACTIONATED)

## 2015-07-01 MED ORDER — SUCRALFATE 1 G PO TABS
1.0000 g | ORAL_TABLET | Freq: Three times a day (TID) | ORAL | Status: DC
  Administered 2015-07-01 – 2015-07-02 (×5): 1 g via ORAL
  Filled 2015-07-01 (×5): qty 1

## 2015-07-01 NOTE — Clinical Social Work Note (Signed)
Clinical Social Work Assessment  Patient Details  Name: [name] MRN: [id number] Date of Birth: 12-01-1955  Date of referral:  07/01/15               Reason for consult:  Facility Placement                Permission sought to share information with:  Family Supports Permission granted to share information::  Yes, Verbal Permission Granted  Name::     Lyndee Herbst  Relationship::  Daughter  Contact Information:  [phone]  Housing/Transportation Living arrangements for the past 2 months:  [place] of Information:  Patient Patient Interpreter Needed:  None Criminal Activity/Legal Involvement Pertinent to Current Situation/Hospitalization:  No - Comment as needed Significant Relationships:  Adult Children, Siblings Lives with:  Facility Resident Do you feel safe going back to the place where you live?  Yes Need for family participation in patient care:  Yes (Comment)  Care giving concerns:  Patient with no family/friends at bedside, however patient plans to notify of discharge plans.   Social Worker assessment / plan:  Holiday representative met with patient at bedside to offer support and discuss patient plans at discharge.  Patient states that she was living at [facility] and plans to return at discharge.  CSW contacted facility liaison who states that patient is able to return once medically stable.  Patient states that she will notify family at the time of discharge and plans to transport via ambulance.  CSW remains available for support and to facilitate patient discharge needs once medically stable.  Employment status:  Unemployed Forensic scientist:  Medicaid In [place] PT Recommendations:  [place] / Referral to community resources:  [place]  Patient/Family's Response to care:  Patient verbalized understanding of CSW role and appreciation of support and concern.  Patient is satisfied with  facility and feels that she will be cared for adequately upon her return.  Patient/Family's Understanding of and Emotional Response to Diagnosis, Current Treatment, and Prognosis:  Patient understanding of her current diagnosis and need for continued SNF placement at discharge.    Emotional Assessment Appearance:  Appears stated age Attitude/Demeanor/Rapport:  Inconsistent Affect (typically observed):  Flat, Appropriate Orientation:  Oriented to Self, Oriented to Place, Oriented to  Time, Oriented to Situation Alcohol / Substance use:  Not Applicable Psych involvement (Current and /or in the community):  No (Comment)  Discharge Needs  Concerns to be addressed:  No discharge needs identified Readmission within the last 30 days:  Yes Current discharge risk:  None Barriers to Discharge:  No Barriers Identified, Continued Medical Work up  [employer]
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
## 2015-07-01 NOTE — Progress Notes (Signed)
Occupational Therapy Treatment Patient Details Name: [name] MRN: [id number] DOB: [birth year] Today's Date: 07/01/2015    History of present illness 60 y.o. female with medical history significant of hypertension, hyperlipidemia, COPD, GERD, depression, former smoker (quit smoking 1 month ago), cocaine abuse, rheumatoid arthritis, CKD-III, recent right ankle fracture, who presents with chest pain, dizziness, hypotension, cough, shortness of breath, abdominal pain, increased urinary frequency.   OT comments  Focus of session on pt's bilateral hand weakness. Provided pt with red theratubing to compensate for weakness during ADL tasks incuduing eating, grooming, and writing. Educated pt on strengthening HEP, however pt cannot read and will need increased practice. Also discussed pt's preferred daily tasks that she can perform to increase strength and decrease pain in hands. Will continue to follow acutely.    Follow Up Recommendations  SNF;Supervision/Assistance - 24 hour    Equipment Recommendations  None recommended by OT    Recommendations for Other Services      Precautions / Restrictions Precautions Precautions: Fall Required Braces or Orthoses: Other Brace/Splint Other Brace/Splint: camboot, but not here (at nursing facility) Restrictions Weight Bearing Restrictions: Yes Other Position/Activity Restrictions: unsure of WBS RLE. States she was using boot at SNF and walking in parralell bars       Mobility Bed Mobility Overal bed mobility: Needs Assistance Bed Mobility: Supine to Sit     Supine to sit: Supervision     General bed mobility comments: increased time  Transfers                      Balance Overall balance assessment: Needs assistance Sitting-balance support: No upper extremity supported;Feet supported Sitting balance-Leahy Scale: Good                             ADL Overall ADL's : Needs assistance/impaired Eating/Feeding:  Supervision/ safety;With adaptive utensils;Sitting;Cueing for compensatory techinques Eating/Feeding Details (indicate cue type and reason): provided red theratubing and pt trialed use with utensils Grooming: Brushing hair;Supervision/safety;Cueing for compensatory techniques;Sitting Grooming Details (indicate cue type and reason): provided red theratubing and pt trialed use with brush and toothbrush                               General ADL Comments: Focus of session on compensatory strategies for fine motor tasks and strengthening HEP. Provided red theratubing, squeeze ball, and HEP. Pt cannot read - will need increased practice for HEP. Talked through pt's preferred daily tasks that can also provide therapeutic strengthening including cooking, playing cards, and playing with young grandkids and their toys (e.g. beads, play-doh etc...)      Vision                     Perception     Praxis      Cognition   Behavior During Therapy: WFL for tasks assessed/performed Overall Cognitive Status: Within Functional Limits for tasks assessed                       Extremity/Trunk Assessment               Exercises Hand Exercises Digit Composite Flexion: Strengthening;Both;10 reps;Seated;Squeeze ball Composite Extension: Strengthening;Both;10 reps;Squeeze ball;Seated Digit Composite Abduction: Strengthening;Both;10 reps;Seated Digit Composite Adduction: Strengthening;Both;10 reps;Seated Digit Lifts: Strengthening;Both;10 reps;Seated Thumb Abduction: Strengthening;Both;10 reps;Seated Thumb Adduction: Strengthening;Both;10 reps;Seated Opposition: Strengthening;Both;10 reps;Seated Other Exercises Other Exercises:  Discussed pt preferred functional activities to strengthen hand muscles including playing cards, various cooking tasks, and playing with great grandchildren and their toys with small parts (e.g. beads, board games, playdoh etc...)   Shoulder  Instructions       General Comments      Pertinent Vitals/ Pain       Pain Assessment: No/denies pain  Home Living   Living Arrangements: Other relatives                                      Prior Functioning/Environment              Frequency Min 2X/week     Progress Toward Goals  OT Goals(current goals can now be found in the care plan section)  Progress towards OT goals: Progressing toward goals  Acute Rehab OT Goals Patient Stated Goal: to return to living independently again OT Goal Formulation: With patient Time For Goal Achievement: 07/14/15 Potential to Achieve Goals: Good ADL Goals Pt Will Perform Eating: with modified independence;with adaptive utensils Pt Will Perform Grooming: with modified independence Pt Will Perform Lower Body Bathing: with set-up;with supervision;sit to/from stand;sitting/lateral leans Pt Will Transfer to Toilet: with supervision;bedside commode;stand pivot transfer Pt/caregiver will Perform Home Exercise Program: Increased strength;Both right and left upper extremity;With written HEP provided;With theraputty  Plan Discharge plan remains appropriate    Co-evaluation                 End of Session     Activity Tolerance Patient tolerated treatment well   Patient Left in bed;with call bell/phone within reach   Nurse Communication Mobility status        Time: [id number] OT Time Calculation (min): 15 min  Charges: OT General Charges $OT Visit: 1 Procedure OT Treatments $Therapeutic Exercise: 8-22 mins  Redmond Baseman, OTR/L Pager: [phone] 07/01/2015, 4:29 PM

## 2015-07-02 DIAGNOSIS — J410 Simple chronic bronchitis: Secondary | ICD-10-CM

## 2015-07-02 DIAGNOSIS — R079 Chest pain, unspecified: Secondary | ICD-10-CM

## 2015-07-02 DIAGNOSIS — E875 Hyperkalemia: Secondary | ICD-10-CM

## 2015-07-02 DIAGNOSIS — N390 Urinary tract infection, site not specified: Secondary | ICD-10-CM

## 2015-07-02 DIAGNOSIS — E785 Hyperlipidemia, unspecified: Secondary | ICD-10-CM

## 2015-07-02 DIAGNOSIS — A419 Sepsis, unspecified organism: Principal | ICD-10-CM

## 2015-07-02 DIAGNOSIS — N1330 Unspecified hydronephrosis: Secondary | ICD-10-CM

## 2015-07-02 DIAGNOSIS — I10 Essential (primary) hypertension: Secondary | ICD-10-CM

## 2015-07-02 DIAGNOSIS — J189 Pneumonia, unspecified organism: Secondary | ICD-10-CM

## 2015-07-02 DIAGNOSIS — N179 Acute kidney failure, unspecified: Secondary | ICD-10-CM

## 2015-07-02 DIAGNOSIS — Z720 Tobacco use: Secondary | ICD-10-CM

## 2015-07-02 DIAGNOSIS — R1011 Right upper quadrant pain: Secondary | ICD-10-CM

## 2015-07-02 DIAGNOSIS — N183 Chronic kidney disease, stage 3 (moderate): Secondary | ICD-10-CM

## 2015-07-02 DIAGNOSIS — I959 Hypotension, unspecified: Secondary | ICD-10-CM

## 2015-07-02 DIAGNOSIS — K219 Gastro-esophageal reflux disease without esophagitis: Secondary | ICD-10-CM

## 2015-07-02 MED ORDER — APIXABAN 5 MG PO TABS: 10.0000 mg | ORAL_TABLET | Freq: Two times a day (BID) | ORAL | Status: DC

## 2015-07-02 MED ORDER — APIXABAN 5 MG PO TABS: 5.0000 mg | ORAL_TABLET | Freq: Two times a day (BID) | ORAL | Status: DC

## 2015-07-02 MED ORDER — DOXYCYCLINE HYCLATE 100 MG PO TABS
100.0000 mg | ORAL_TABLET | Freq: Two times a day (BID) | ORAL | Status: AC
Start: 2015-07-02 — End: 2015-07-09

## 2015-07-02 MED ORDER — SUCRALFATE 1 G PO TABS: 1.0000 g | ORAL_TABLET | Freq: Three times a day (TID) | ORAL | Status: DC

## 2015-07-02 MED ORDER — DOXYCYCLINE HYCLATE 100 MG PO TABS
100.0000 mg | ORAL_TABLET | Freq: Two times a day (BID) | ORAL | Status: DC
  Administered 2015-07-02: 100 mg via ORAL
  Filled 2015-07-02: qty 1

## 2015-07-02 MED ORDER — ASPIRIN 325 MG PO TABS: 325.0000 mg | ORAL_TABLET | Freq: Every day | ORAL | Status: DC

## 2015-07-02 MED ORDER — APIXABAN 5 MG PO TABS
10.0000 mg | ORAL_TABLET | Freq: Two times a day (BID) | ORAL | Status: DC
Start: 2015-07-02 — End: 2015-07-02

## 2015-07-02 NOTE — Discharge Instructions (Signed)
Information on my medicine - ELIQUIS (apixaban)  This medication education was reviewed with me or my healthcare representative as part of my discharge preparation.    Why was Eliquis prescribed for you? Eliquis was prescribed to treat blood clots that may have been found in the veins of your legs (deep vein thrombosis) or in your lungs (pulmonary embolism) and to reduce the risk of them occurring again.  What do You need to know about Eliquis ? The starting dose is 10 mg (two 5 mg tablets) taken TWICE daily for the FIRST SEVEN (7) DAYS, then on (enter date)  07/07/2015  the dose is reduced to ONE 5 mg tablet taken TWICE daily.  Eliquis may be taken with or without food.   Try to take the dose about the same time in the morning and in the evening. If you have difficulty swallowing the tablet whole please discuss with your pharmacist how to take the medication safely.  Take Eliquis exactly as prescribed and DO NOT stop taking Eliquis without talking to the doctor who prescribed the medication.  Stopping may increase your risk of developing a new blood clot.  Refill your prescription before you run out.  After discharge, you should have regular check-up appointments with your healthcare provider that is prescribing your Eliquis.    What do you do if you miss a dose? If a dose of ELIQUIS is not taken at the scheduled time, take it as soon as possible on the same day and twice-daily administration should be resumed. The dose should not be doubled to make up for a missed dose.  Important Safety Information A possible side effect of Eliquis is bleeding. You should call your healthcare provider right away if you experience any of the following: ? Bleeding from an injury or your nose that does not stop. ? Unusual colored urine (red or dark brown) or unusual colored stools (red or black). ? Unusual bruising for unknown reasons. ? A serious fall or if you hit your head (even if there is no  bleeding).  Some medicines may interact with Eliquis and might increase your risk of bleeding or clotting while on Eliquis. To help avoid this, consult your healthcare provider or pharmacist prior to using any new prescription or non-prescription medications, including herbals, vitamins, non-steroidal anti-inflammatory drugs (NSAIDs) and supplements.  This website has more information on Eliquis (apixaban): http://www.eliquis.com/eliquis/home

## 2015-07-02 NOTE — Clinical Social Work Note (Signed)
Clinical Social Worker facilitated patient discharge including contacting patient family and facility to confirm patient discharge plans.  Clinical information faxed to facility and family agreeable with plan.  CSW arranged ambulance transport via [facility] to [facility].  RN to call report prior to discharge ([phone]).  Clinical Social Worker will sign off for now as social work intervention is no longer needed. Please consult [place] again if new need arises.  Barbette Or, [place]
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
## 2015-07-02 NOTE — Progress Notes (Signed)
Report called to [place]. Pt discharged home. Discharge instructions gone over with the facility. Etta Quill, RN

## 2015-07-03 ENCOUNTER — Non-Acute Institutional Stay (SKILLED_NURSING_FACILITY): Payer: Medicaid Other | Admitting: Internal Medicine

## 2015-07-03 ENCOUNTER — Encounter: Payer: Self-pay | Admitting: Internal Medicine

## 2015-07-03 DIAGNOSIS — K219 Gastro-esophageal reflux disease without esophagitis: Secondary | ICD-10-CM

## 2015-07-03 DIAGNOSIS — I10 Essential (primary) hypertension: Secondary | ICD-10-CM | POA: Diagnosis not present

## 2015-07-03 DIAGNOSIS — I959 Hypotension, unspecified: Secondary | ICD-10-CM | POA: Diagnosis not present

## 2015-07-03 DIAGNOSIS — F1410 Cocaine abuse, uncomplicated: Secondary | ICD-10-CM | POA: Diagnosis not present

## 2015-07-03 DIAGNOSIS — J189 Pneumonia, unspecified organism: Secondary | ICD-10-CM | POA: Diagnosis not present

## 2015-07-03 DIAGNOSIS — N183 Chronic kidney disease, stage 3 (moderate): Secondary | ICD-10-CM

## 2015-07-03 DIAGNOSIS — N390 Urinary tract infection, site not specified: Secondary | ICD-10-CM | POA: Diagnosis not present

## 2015-07-03 DIAGNOSIS — D6489 Other specified anemias: Secondary | ICD-10-CM

## 2015-07-03 DIAGNOSIS — B370 Candidal stomatitis: Secondary | ICD-10-CM

## 2015-07-03 DIAGNOSIS — A419 Sepsis, unspecified organism: Secondary | ICD-10-CM

## 2015-07-03 DIAGNOSIS — R079 Chest pain, unspecified: Secondary | ICD-10-CM

## 2015-07-03 DIAGNOSIS — N179 Acute kidney failure, unspecified: Secondary | ICD-10-CM

## 2015-07-03 DIAGNOSIS — R1011 Right upper quadrant pain: Secondary | ICD-10-CM

## 2015-07-03 DIAGNOSIS — Z720 Tobacco use: Secondary | ICD-10-CM
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
## 2015-07-04 ENCOUNTER — Other Ambulatory Visit: Payer: Self-pay | Admitting: *Deleted

## 2015-07-04 LAB — CULTURE, BLOOD (ROUTINE X 2)
Culture: NO GROWTH
Culture: NO GROWTH

## 2015-07-04 MED ORDER — TRAMADOL HCL 50 MG PO TABS: ORAL_TABLET | ORAL | Status: DC

## 2015-07-04 NOTE — Telephone Encounter (Signed)
[facility] 

## 2015-07-08 ENCOUNTER — Encounter: Payer: Self-pay | Admitting: Adult Health

## 2015-07-08 ENCOUNTER — Non-Acute Institutional Stay (SKILLED_NURSING_FACILITY): Payer: Medicaid Other | Admitting: Adult Health

## 2015-07-08 DIAGNOSIS — N179 Acute kidney failure, unspecified: Secondary | ICD-10-CM | POA: Diagnosis not present

## 2015-07-08 DIAGNOSIS — N183 Chronic kidney disease, stage 3 unspecified: Secondary | ICD-10-CM

## 2015-07-08 DIAGNOSIS — J189 Pneumonia, unspecified organism: Secondary | ICD-10-CM

## 2015-07-08 DIAGNOSIS — J410 Simple chronic bronchitis: Secondary | ICD-10-CM | POA: Diagnosis not present

## 2015-07-08 DIAGNOSIS — Z981 Arthrodesis status: Secondary | ICD-10-CM
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
## 2015-08-09 ENCOUNTER — Emergency Department (HOSPITAL_COMMUNITY): Payer: Medicaid Other

## 2015-08-09 ENCOUNTER — Encounter (HOSPITAL_COMMUNITY): Payer: Self-pay

## 2015-08-09 ENCOUNTER — Emergency Department (HOSPITAL_COMMUNITY)
Admission: EM | Admit: 2015-08-09 | Discharge: 2015-08-09 | Disposition: A | Discharge status: Home / Self Care | Payer: Medicaid Other | Attending: Emergency Medicine | Admitting: Emergency Medicine

## 2015-08-09 DIAGNOSIS — I129 Hypertensive chronic kidney disease with stage 1 through stage 4 chronic kidney disease, or unspecified chronic kidney disease: Secondary | ICD-10-CM | POA: Diagnosis not present

## 2015-08-09 DIAGNOSIS — Z87891 Personal history of nicotine dependence: Secondary | ICD-10-CM | POA: Insufficient documentation

## 2015-08-09 DIAGNOSIS — D649 Anemia, unspecified: Secondary | ICD-10-CM | POA: Diagnosis not present

## 2015-08-09 DIAGNOSIS — Z7982 Long term (current) use of aspirin: Secondary | ICD-10-CM | POA: Insufficient documentation

## 2015-08-09 DIAGNOSIS — E876 Hypokalemia: Secondary | ICD-10-CM | POA: Diagnosis not present

## 2015-08-09 DIAGNOSIS — Z79899 Other long term (current) drug therapy: Secondary | ICD-10-CM | POA: Diagnosis not present

## 2015-08-09 DIAGNOSIS — R600 Localized edema: Secondary | ICD-10-CM | POA: Insufficient documentation

## 2015-08-09 DIAGNOSIS — J449 Chronic obstructive pulmonary disease, unspecified: Secondary | ICD-10-CM | POA: Insufficient documentation

## 2015-08-09 DIAGNOSIS — N183 Chronic kidney disease, stage 3 (moderate): Secondary | ICD-10-CM | POA: Diagnosis not present

## 2015-08-09 DIAGNOSIS — Z7901 Long term (current) use of anticoagulants: Secondary | ICD-10-CM | POA: Diagnosis not present

## 2015-08-09 DIAGNOSIS — R0789 Other chest pain: Secondary | ICD-10-CM | POA: Diagnosis present

## 2015-08-09 LAB — CBC
HEMATOCRIT: 35 % — AB (ref 36.0–46.0)
HEMOGLOBIN: 10.8 g/dL — AB (ref 12.0–15.0)
MCH: 28.2 pg (ref 26.0–34.0)
MCHC: 30.9 g/dL (ref 30.0–36.0)
MCV: 91.4 fL (ref 78.0–100.0)
Platelets: 297 10*3/uL (ref 150–400)
RBC: 3.83 MIL/uL — AB (ref 3.87–5.11)
RDW: 16.3 % — ABNORMAL HIGH (ref 11.5–15.5)
WBC: 9.1 10*3/uL (ref 4.0–10.5)

## 2015-08-09 LAB — LIPASE, BLOOD: LIPASE: 19 U/L (ref 11–51)

## 2015-08-09 LAB — I-STAT TROPONIN, ED: TROPONIN I, POC: 0 ng/mL (ref 0.00–0.08)

## 2015-08-09 LAB — BASIC METABOLIC PANEL
ANION GAP: 6 (ref 5–15)
BUN: 17 mg/dL (ref 6–20)
CALCIUM: 8.3 mg/dL — AB (ref 8.9–10.3)
CO2: 26 mmol/L (ref 22–32)
Chloride: 107 mmol/L (ref 101–111)
Creatinine, Ser: 0.94 mg/dL (ref 0.44–1.00)
Glucose, Bld: 88 mg/dL (ref 65–99)
POTASSIUM: 3.2 mmol/L — AB (ref 3.5–5.1)
Sodium: 139 mmol/L (ref 135–145)

## 2015-08-09 LAB — HEPATIC FUNCTION PANEL
ALBUMIN: 3 g/dL — AB (ref 3.5–5.0)
ALT: 13 U/L — AB (ref 14–54)
AST: 13 U/L — AB (ref 15–41)
Alkaline Phosphatase: 90 U/L (ref 38–126)
Bilirubin, Direct: <0.1 mg/dL — ABNORMAL LOW (ref 0.1–0.5)
TOTAL PROTEIN: 5.8 g/dL — AB (ref 6.5–8.1)
Total Bilirubin: 0.4 mg/dL (ref 0.3–1.2)

## 2015-08-09 LAB — URINALYSIS, ROUTINE W REFLEX MICROSCOPIC
Bilirubin Urine: NEGATIVE
GLUCOSE, UA: NEGATIVE mg/dL
Hgb urine dipstick: NEGATIVE
KETONES UR: NEGATIVE mg/dL
NITRITE: NEGATIVE
PROTEIN: NEGATIVE mg/dL
Specific Gravity, Urine: 1.011 (ref 1.005–1.030)
pH: 7 (ref 5.0–8.0)

## 2015-08-09 LAB — RAPID URINE DRUG SCREEN, HOSP PERFORMED
AMPHETAMINES: NOT DETECTED
BARBITURATES: NOT DETECTED
BENZODIAZEPINES: NOT DETECTED
COCAINE: NOT DETECTED
Opiates: NOT DETECTED
TETRAHYDROCANNABINOL: NOT DETECTED

## 2015-08-09 LAB — URINE MICROSCOPIC-ADD ON: RBC / HPF: NONE SEEN RBC/hpf (ref 0–5)

## 2015-08-09 MED ORDER — METOCLOPRAMIDE HCL 10 MG PO TABS
10.0000 mg | ORAL_TABLET | Freq: Once | ORAL | Status: AC
  Administered 2015-08-09: 10 mg via ORAL
  Filled 2015-08-09: qty 1

## 2015-08-09 MED ORDER — POTASSIUM CHLORIDE CRYS ER 20 MEQ PO TBCR
40.0000 meq | EXTENDED_RELEASE_TABLET | Freq: Once | ORAL | Status: AC
  Administered 2015-08-09: 40 meq via ORAL
  Filled 2015-08-09: qty 2

## 2015-08-09 MED ORDER — ACETAMINOPHEN 325 MG PO TABS
650.0000 mg | ORAL_TABLET | Freq: Once | ORAL | Status: DC
  Filled 2015-08-09: qty 2

## 2015-08-09 NOTE — ED Notes (Signed)
Pt. C/o neck and back pain. EDP made aware.

## 2015-08-09 NOTE — ED Notes (Signed)
EDP at bedside  

## 2015-08-09 NOTE — ED Notes (Signed)
Pt. Wheeled wheelchair to restroom at this time.

## 2015-08-09 NOTE — ED Notes (Signed)
EDP gave pt. Permission to take home oxycodone.

## 2015-08-09 NOTE — Discharge Instructions (Signed)
Call [facility] or call Dr.Avbuere to arrange for follow-up. We feel that you should see a gastroenterologist regarding food getting stuck in your food pipe. Your blood pressure should be rechecked within the next 3 weeks. Today's was elevated at 151/87

## 2015-08-09 NOTE — ED Triage Notes (Signed)
Pt states she started having intermit central chest pain on yesterday evening; pt states she had 4 x n/v; pt denies LOC but states SOB and weakness;pt states she has swelling of extremities; states hx of CHF and HTN; Pt a&ox 4 on arrival; pt at 7/10 pain
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
## 2015-08-09 NOTE — ED Notes (Signed)
Pt. Assisted to use bedpan

## 2015-08-12 LAB — URINE CULTURE

## 2015-08-13 ENCOUNTER — Telehealth (HOSPITAL_BASED_OUTPATIENT_CLINIC_OR_DEPARTMENT_OTHER): Payer: Self-pay | Admitting: Emergency Medicine

## 2015-08-13 NOTE — Telephone Encounter (Signed)
Post ED Visit - Positive Culture Follow-up  Culture report reviewed by antimicrobial stewardship pharmacist:  []  Elenor Quinones, Pharm.D. []  Heide Guile, Pharm.D., BCPS []  Parks Neptune, Pharm.D. []  Alycia Rossetti, Pharm.D., BCPS []  [place], Pharm.D., BCPS, AAHIVP []  Legrand Como, Pharm.D., BCPS, AAHIVP []  Milus Glazier, Pharm.D. []  Stephens November, Pharm.D. Dimitri Ped PharmD  Positive urine culture Treated with none, asymptomatic, no further patient follow-up is required at this time.  Hazle Nordmann 08/13/2015, 9:55 AM

## 2015-08-21 ENCOUNTER — Other Ambulatory Visit: Payer: Self-pay | Admitting: Adult Health

## 2015-08-31 ENCOUNTER — Other Ambulatory Visit: Payer: Self-pay | Admitting: Adult Health

## 2015-09-10 IMAGING — CT CT HEAD W/O CM
2 series · 16 of 30 positions shown, 20 images · non-contrast
Comparison: Head CT scan 04/15/2010.

CLINICAL DATA: Tingling in the left arm for 1 month.

EXAM:
CT HEAD WITHOUT CONTRAST
TECHNIQUE: Contiguous axial images were obtained from the base of the skull
through the vertex without intravenous contrast.

[Series 2: head w/o · axial · non-contrast · 0.45mm/px · z∈[-182,-58]mm · 13 of 31 slices shown, 17 images]
[im 3/31  brain]
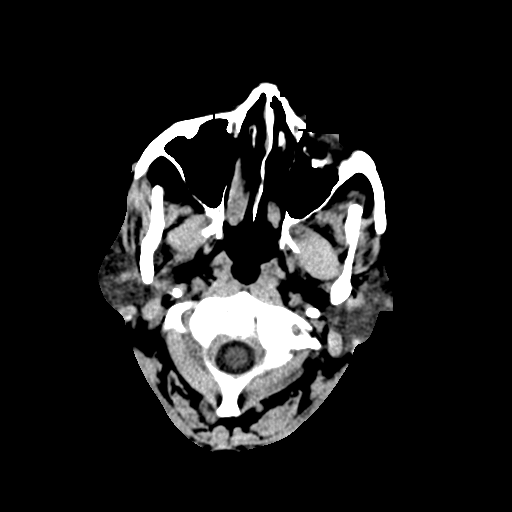
[im 3/31  bone]
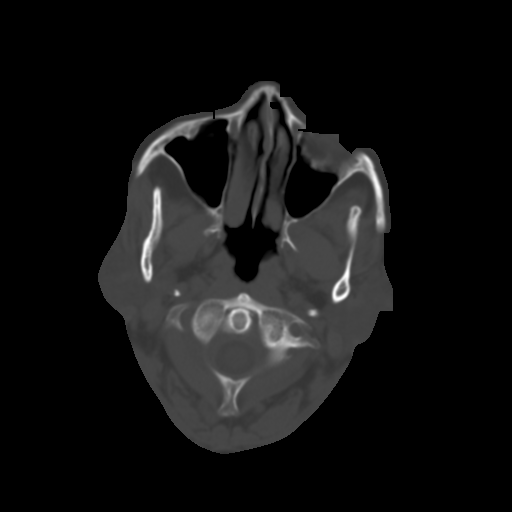
[im 5/31  brain]
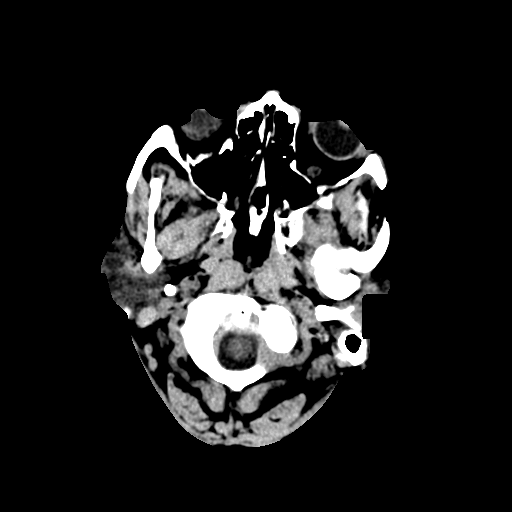
[im 7/31  brain]
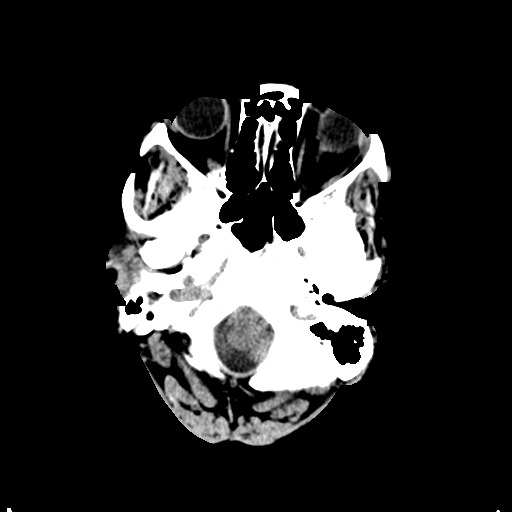
[im 9/31  brain]
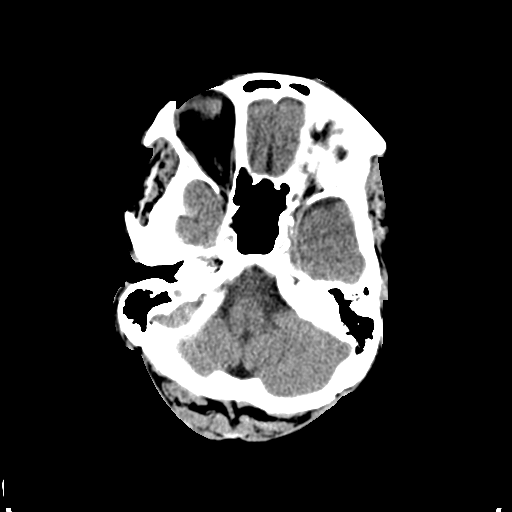
[im 11/31  brain]
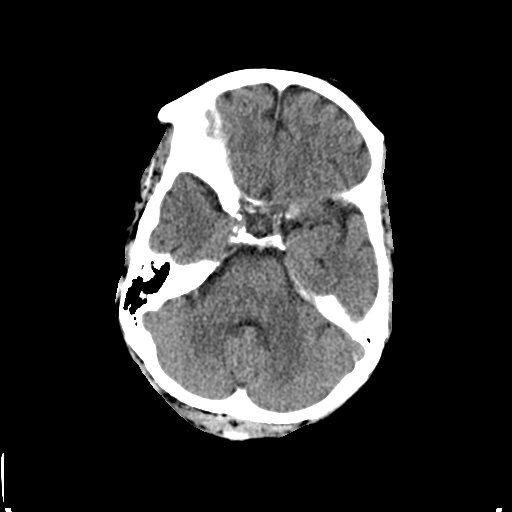
[im 11/31  bone]
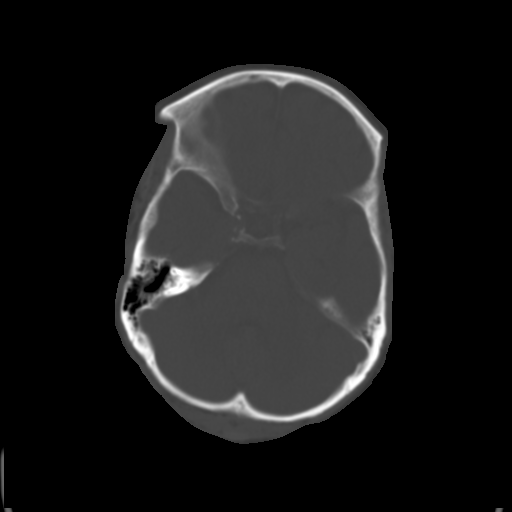
[im 13/31  brain]
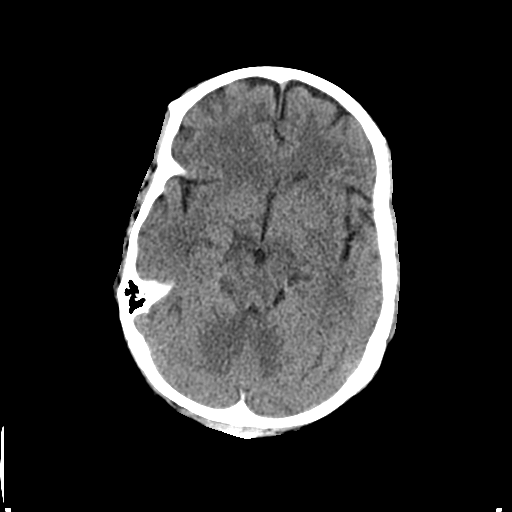
[im 16/31  brain]
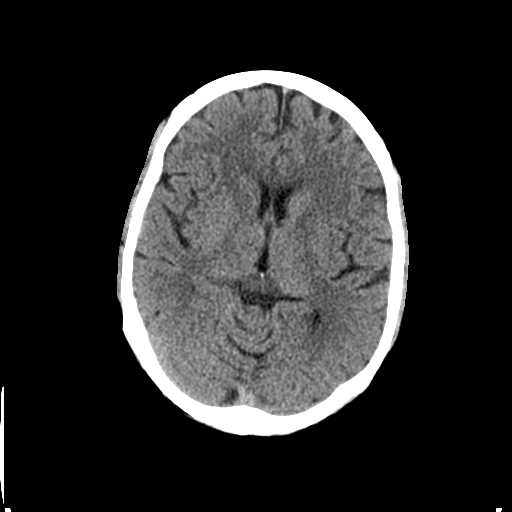
[im 18/31  brain]
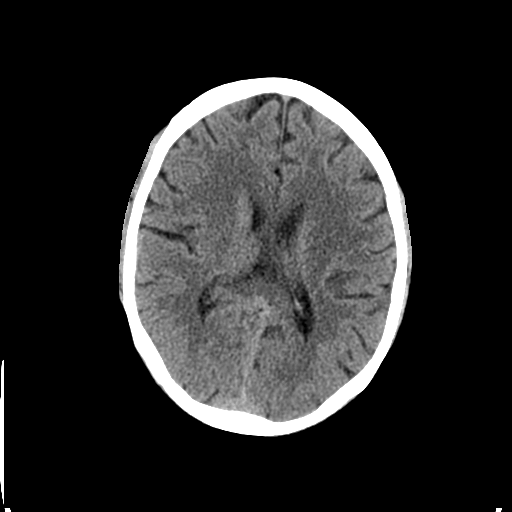
[im 20/31  brain]
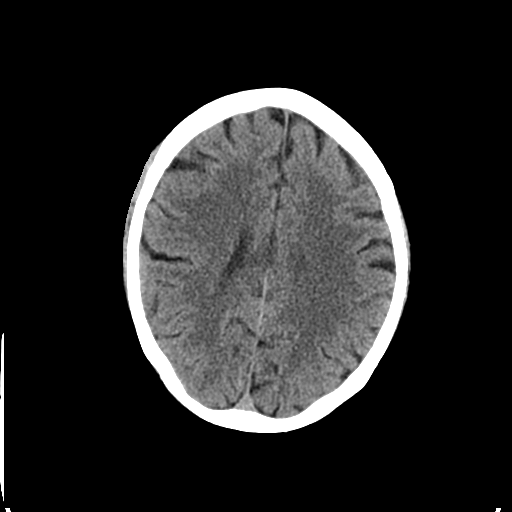
[im 20/31  bone]
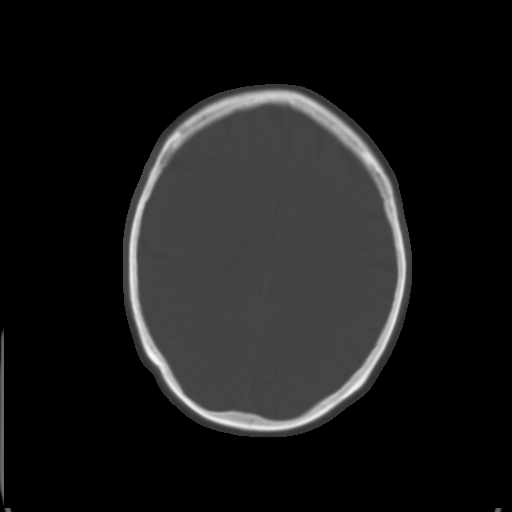
[im 22/31  brain]
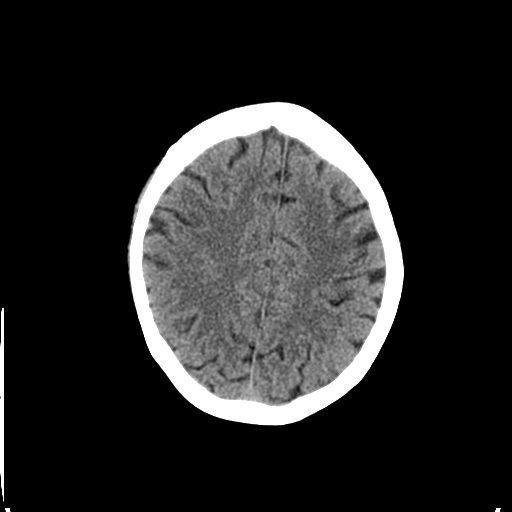
[im 24/31  brain]
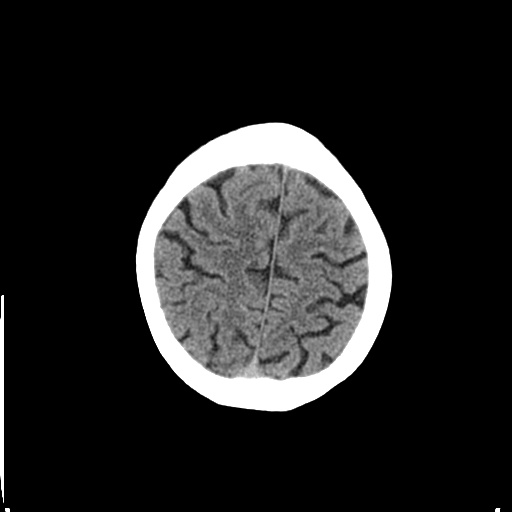
[im 26/31  brain]
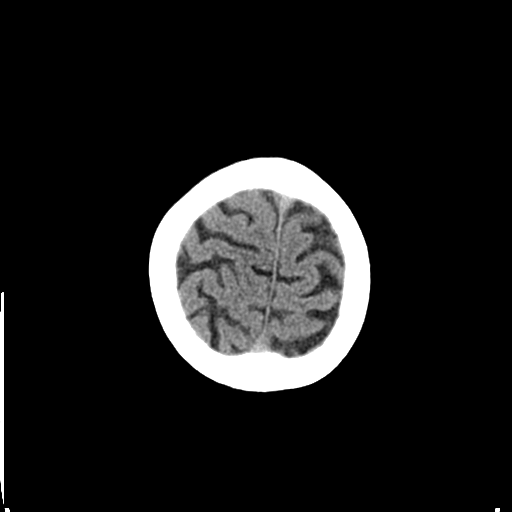
[im 28/31  brain]
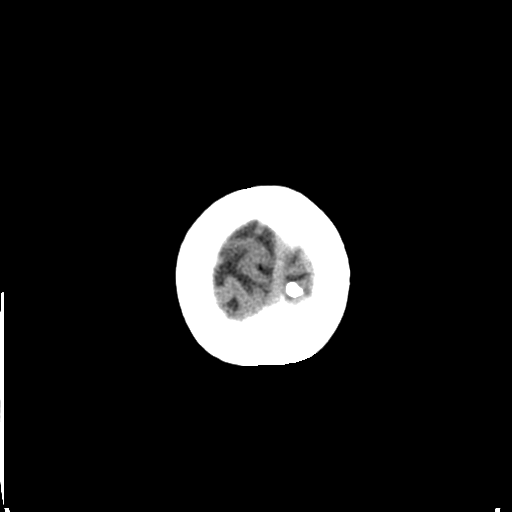
[im 28/31  bone]
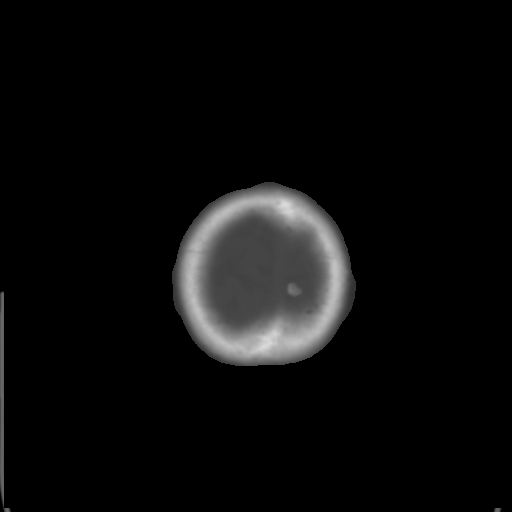

[Series 3: bone windows · axial · 0.45mm/px · z∈[-182,-142]mm · 3 of 31 slices shown]
[im 3/31  bone]
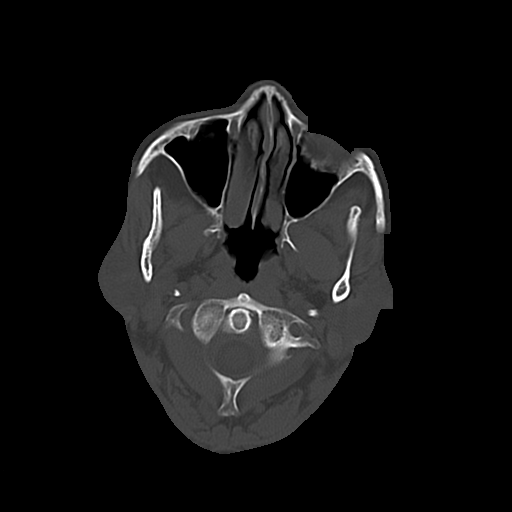
[im 7/31  bone]
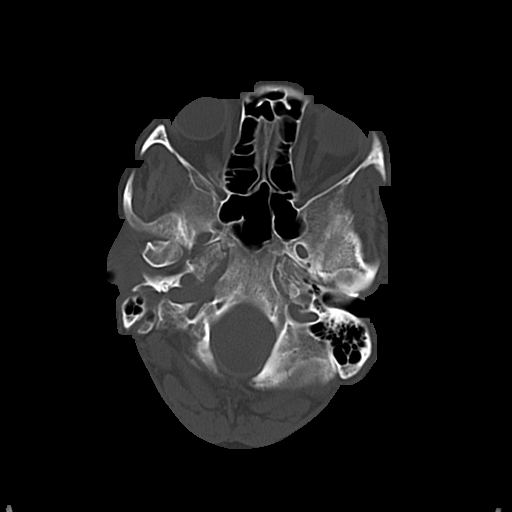
[im 11/31  bone]
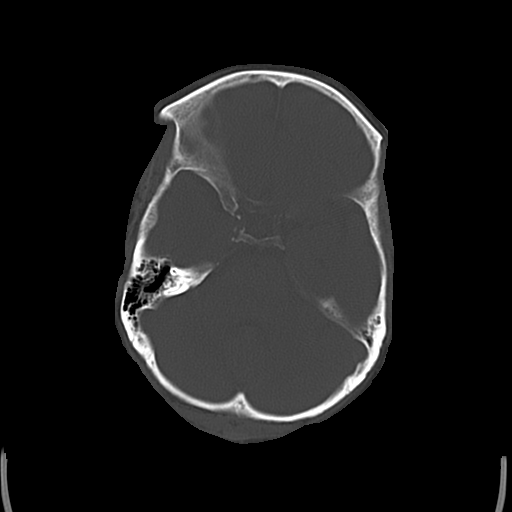

[16 of 30 positions shown; findings below may reference images not displayed]

FINDINGS: The brain appears normal without hemorrhage, infarct, mass lesion,
mass effect, midline shift or abnormal extra-axial fluid collection.
No hydrocephalus or pneumocephalus. Atherosclerosis in the carotid
siphons is noted. The calvarium is intact. Imaged paranasal sinuses
and mastoid air cells are clear.
IMPRESSION: No acute finding.

Carotid atherosclerosis.

## 2015-09-10 IMAGING — CR DG CHEST 2V
2 series · 2 of 2 positions shown · non-contrast
Comparison: 11/18/2013

CLINICAL DATA: Rheumatoid arthritis. Tingling in left arm since
last month.

EXAM:
CHEST  2 VIEW

[w chest lat]
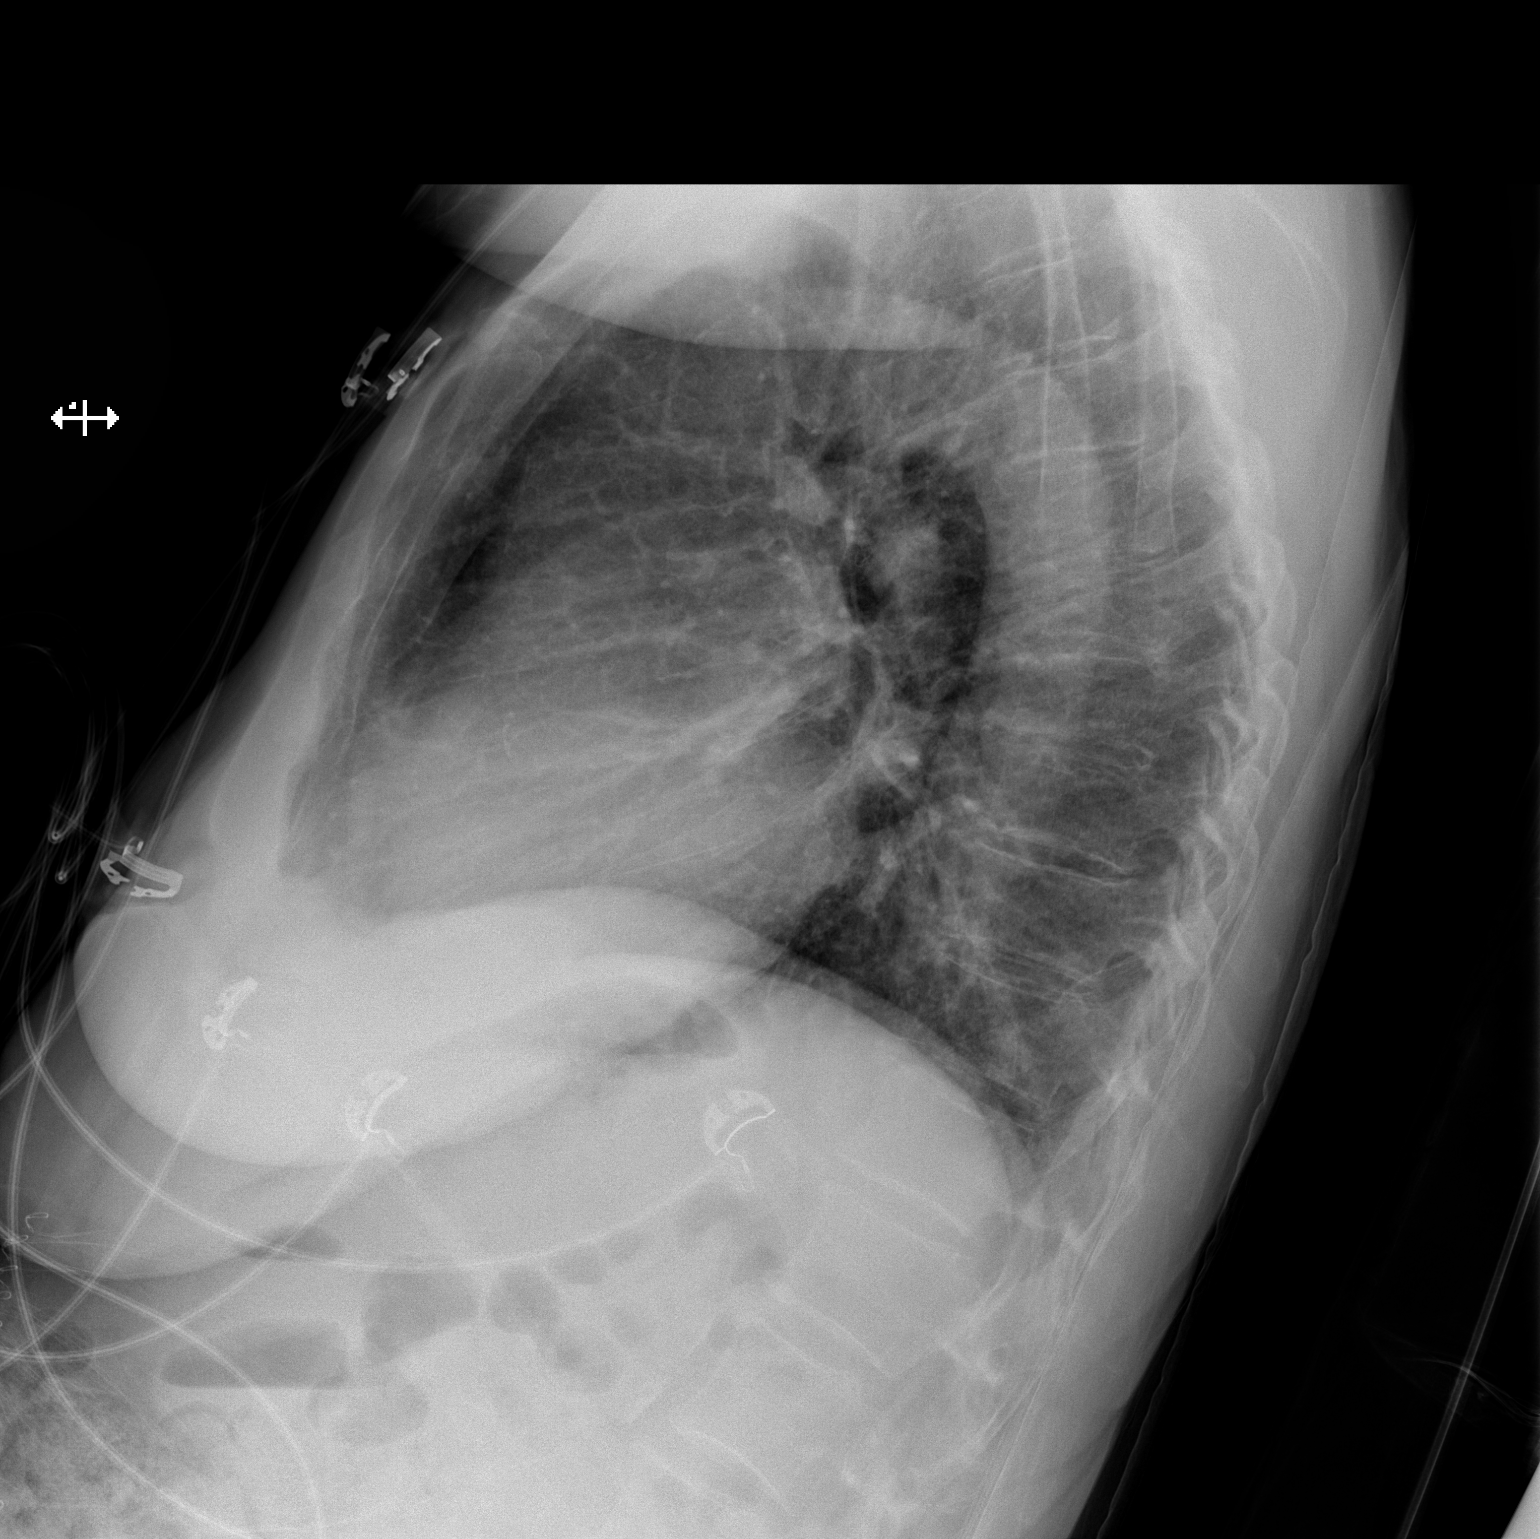

[x chest ap]
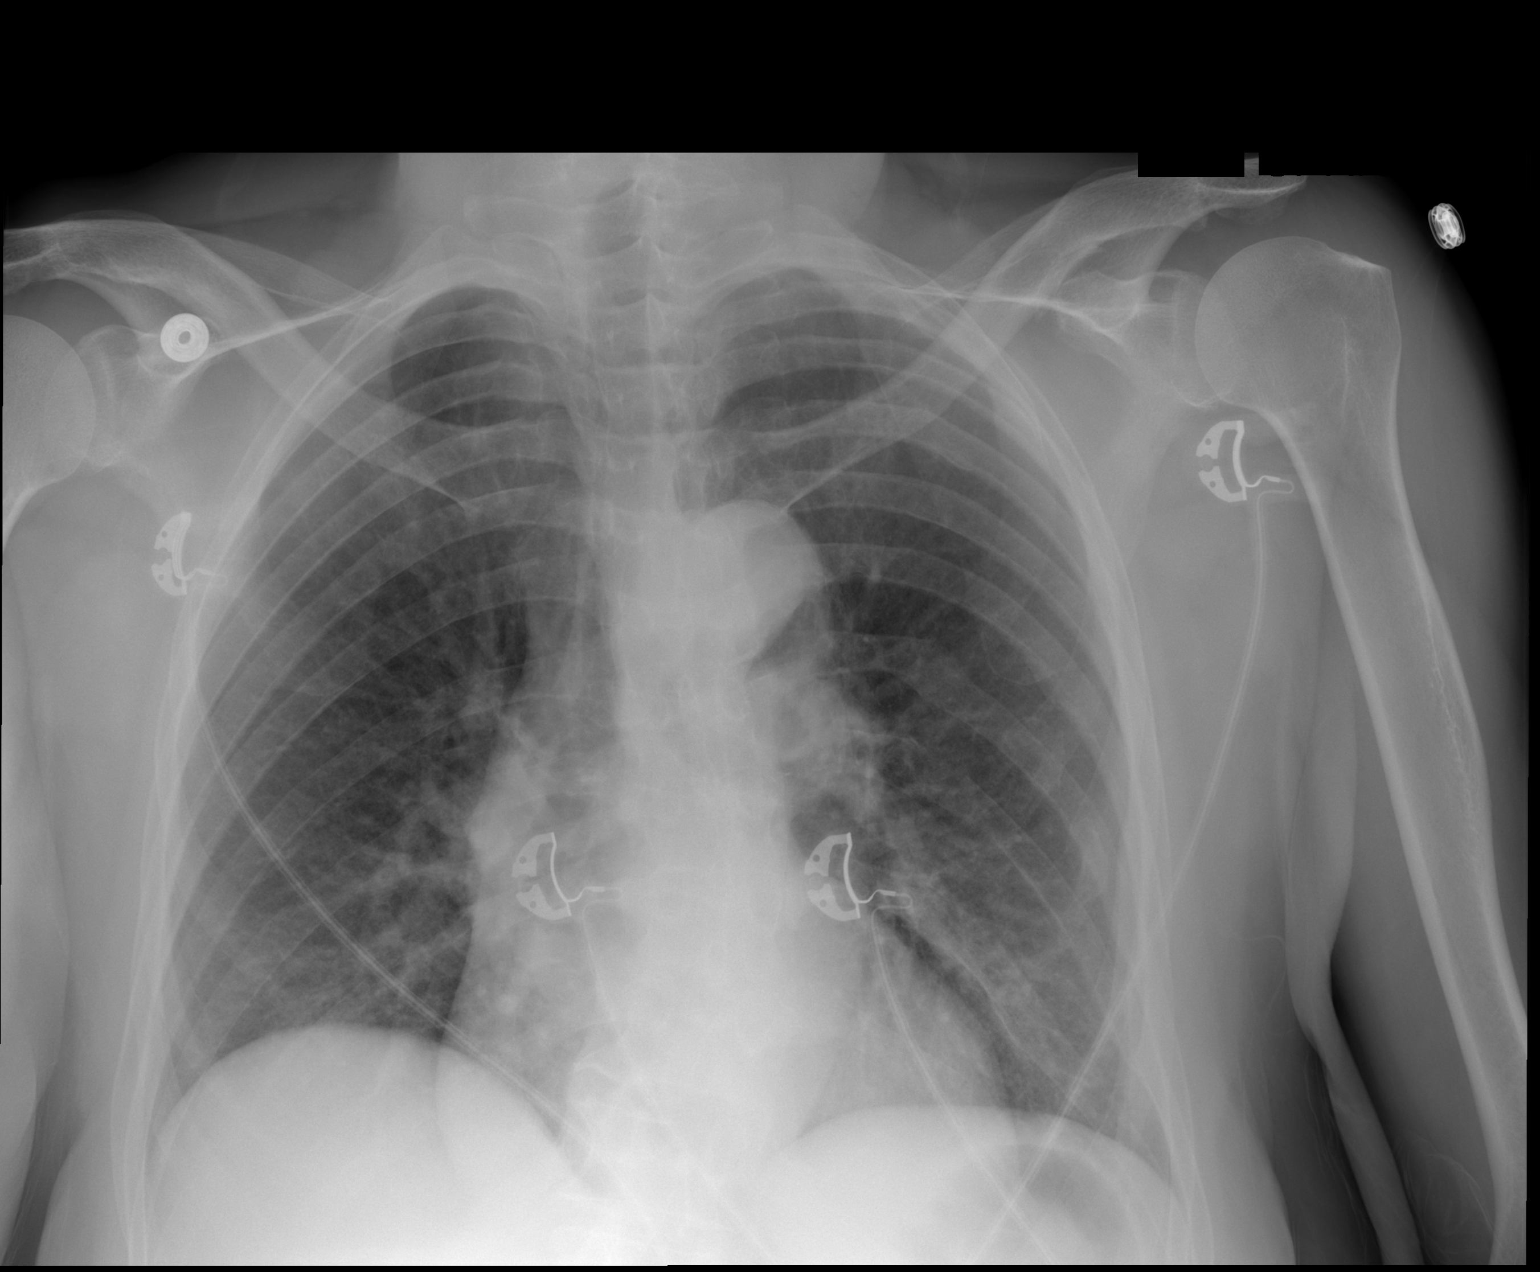

[2 of 2 positions shown; findings below may reference images not displayed]

FINDINGS: There is mild cardiac enlargement. Tortuous and unfolded thoracic
aorta is noted. Both lungs are clear. The visualized skeletal
structures are unremarkable.
IMPRESSION: No active cardiopulmonary disease.

## 2015-11-07 ENCOUNTER — Emergency Department (HOSPITAL_COMMUNITY): Payer: Medicaid Other

## 2015-11-07 ENCOUNTER — Emergency Department (HOSPITAL_COMMUNITY)
Admission: EM | Admit: 2015-11-07 | Discharge: 2015-11-07 | Disposition: A | Discharge status: Home / Self Care | Payer: Medicaid Other | Attending: Emergency Medicine | Admitting: Emergency Medicine

## 2015-11-07 ENCOUNTER — Encounter (HOSPITAL_COMMUNITY): Payer: Self-pay | Admitting: *Deleted

## 2015-11-07 DIAGNOSIS — Z7982 Long term (current) use of aspirin: Secondary | ICD-10-CM | POA: Diagnosis not present

## 2015-11-07 DIAGNOSIS — Z7901 Long term (current) use of anticoagulants: Secondary | ICD-10-CM | POA: Insufficient documentation

## 2015-11-07 DIAGNOSIS — R079 Chest pain, unspecified: Secondary | ICD-10-CM | POA: Insufficient documentation

## 2015-11-07 DIAGNOSIS — J449 Chronic obstructive pulmonary disease, unspecified: Secondary | ICD-10-CM | POA: Diagnosis not present

## 2015-11-07 DIAGNOSIS — I129 Hypertensive chronic kidney disease with stage 1 through stage 4 chronic kidney disease, or unspecified chronic kidney disease: Secondary | ICD-10-CM | POA: Diagnosis not present

## 2015-11-07 DIAGNOSIS — Z79899 Other long term (current) drug therapy: Secondary | ICD-10-CM | POA: Diagnosis not present

## 2015-11-07 DIAGNOSIS — Z87891 Personal history of nicotine dependence: Secondary | ICD-10-CM | POA: Insufficient documentation

## 2015-11-07 DIAGNOSIS — N183 Chronic kidney disease, stage 3 (moderate): Secondary | ICD-10-CM | POA: Diagnosis not present

## 2015-11-07 LAB — BASIC METABOLIC PANEL
ANION GAP: 10 (ref 5–15)
BUN: 17 mg/dL (ref 6–20)
CALCIUM: 9.2 mg/dL (ref 8.9–10.3)
CO2: 21 mmol/L — ABNORMAL LOW (ref 22–32)
Chloride: 108 mmol/L (ref 101–111)
Creatinine, Ser: 1.1 mg/dL — ABNORMAL HIGH (ref 0.44–1.00)
GFR, EST NON AFRICAN AMERICAN: 53 mL/min — AB (ref >60)
Glucose, Bld: 73 mg/dL (ref 65–99)
POTASSIUM: 3.3 mmol/L — AB (ref 3.5–5.1)
SODIUM: 139 mmol/L (ref 135–145)

## 2015-11-07 LAB — PROTIME-INR
INR: 0.98
PROTHROMBIN TIME: 13 s (ref 11.4–15.2)

## 2015-11-07 LAB — CBC
HEMATOCRIT: 41.9 % (ref 36.0–46.0)
HEMOGLOBIN: 14.1 g/dL (ref 12.0–15.0)
MCH: 29.1 pg (ref 26.0–34.0)
MCHC: 33.7 g/dL (ref 30.0–36.0)
MCV: 86.6 fL (ref 78.0–100.0)
Platelets: 322 10*3/uL (ref 150–400)
RBC: 4.84 MIL/uL (ref 3.87–5.11)
RDW: 14.4 % (ref 11.5–15.5)
WBC: 8 10*3/uL (ref 4.0–10.5)

## 2015-11-07 LAB — I-STAT TROPONIN, ED: TROPONIN I, POC: 0.01 ng/mL (ref 0.00–0.08)

## 2015-11-07 NOTE — ED Notes (Signed)
Pt ambulated to restroom in room, tolerated well.

## 2015-11-07 NOTE — Discharge Instructions (Signed)
Tests showed no life-threatening condition.  Follow-up your primary care doctor. °

## 2015-11-07 NOTE — ED Notes (Signed)
Ordered diet tray 

## 2015-11-07 NOTE — ED Triage Notes (Addendum)
Pt states chest pain that started last night and radiates to her L arm.  L arm numbness and tingling accompanied by nausea.  Recently tx for PE.
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
## 2015-11-14 ENCOUNTER — Other Ambulatory Visit: Payer: Self-pay | Admitting: Adult Health

## 2015-11-19 ENCOUNTER — Emergency Department (HOSPITAL_COMMUNITY)
Admission: EM | Admit: 2015-11-19 | Discharge: 2015-11-19 | Disposition: A | Discharge status: Home / Self Care | Payer: Medicaid Other | Attending: Emergency Medicine | Admitting: Emergency Medicine

## 2015-11-19 ENCOUNTER — Encounter (HOSPITAL_COMMUNITY): Payer: Self-pay | Admitting: Family Medicine

## 2015-11-19 ENCOUNTER — Emergency Department (HOSPITAL_COMMUNITY): Payer: Medicaid Other

## 2015-11-19 DIAGNOSIS — Z87891 Personal history of nicotine dependence: Secondary | ICD-10-CM | POA: Diagnosis not present

## 2015-11-19 DIAGNOSIS — Z7982 Long term (current) use of aspirin: Secondary | ICD-10-CM | POA: Insufficient documentation

## 2015-11-19 DIAGNOSIS — J449 Chronic obstructive pulmonary disease, unspecified: Secondary | ICD-10-CM | POA: Diagnosis not present

## 2015-11-19 DIAGNOSIS — N183 Chronic kidney disease, stage 3 (moderate): Secondary | ICD-10-CM | POA: Diagnosis not present

## 2015-11-19 DIAGNOSIS — Z79899 Other long term (current) drug therapy: Secondary | ICD-10-CM | POA: Diagnosis not present

## 2015-11-19 DIAGNOSIS — N3000 Acute cystitis without hematuria: Secondary | ICD-10-CM | POA: Diagnosis not present

## 2015-11-19 DIAGNOSIS — I129 Hypertensive chronic kidney disease with stage 1 through stage 4 chronic kidney disease, or unspecified chronic kidney disease: Secondary | ICD-10-CM | POA: Diagnosis not present

## 2015-11-19 DIAGNOSIS — R300 Dysuria: Secondary | ICD-10-CM | POA: Diagnosis present

## 2015-11-19 LAB — COMPREHENSIVE METABOLIC PANEL
ALBUMIN: 3.4 g/dL — AB (ref 3.5–5.0)
ALT: 10 U/L — ABNORMAL LOW (ref 14–54)
ANION GAP: 6 (ref 5–15)
AST: 9 U/L — AB (ref 15–41)
Alkaline Phosphatase: 85 U/L (ref 38–126)
BILIRUBIN TOTAL: 0.7 mg/dL (ref 0.3–1.2)
BUN: 15 mg/dL (ref 6–20)
CHLORIDE: 112 mmol/L — AB (ref 101–111)
CO2: 21 mmol/L — ABNORMAL LOW (ref 22–32)
Calcium: 8.5 mg/dL — ABNORMAL LOW (ref 8.9–10.3)
Creatinine, Ser: 1.13 mg/dL — ABNORMAL HIGH (ref 0.44–1.00)
GFR calc Af Amer: 60 mL/min — ABNORMAL LOW (ref >60)
GFR calc non Af Amer: 51 mL/min — ABNORMAL LOW (ref >60)
GLUCOSE: 106 mg/dL — AB (ref 65–99)
POTASSIUM: 3.4 mmol/L — AB (ref 3.5–5.1)
Sodium: 139 mmol/L (ref 135–145)
TOTAL PROTEIN: 7 g/dL (ref 6.5–8.1)

## 2015-11-19 LAB — URINALYSIS, ROUTINE W REFLEX MICROSCOPIC
Bilirubin Urine: NEGATIVE
GLUCOSE, UA: NEGATIVE mg/dL
Hgb urine dipstick: NEGATIVE
Ketones, ur: NEGATIVE mg/dL
NITRITE: POSITIVE — AB
PH: 6.5 (ref 5.0–8.0)
Protein, ur: NEGATIVE mg/dL
SPECIFIC GRAVITY, URINE: 1.012 (ref 1.005–1.030)

## 2015-11-19 LAB — URINE MICROSCOPIC-ADD ON: RBC / HPF: NONE SEEN RBC/hpf (ref 0–5)

## 2015-11-19 LAB — CBC
HEMATOCRIT: 37.9 % (ref 36.0–46.0)
HEMOGLOBIN: 12.8 g/dL (ref 12.0–15.0)
MCH: 28.5 pg (ref 26.0–34.0)
MCHC: 33.8 g/dL (ref 30.0–36.0)
MCV: 84.4 fL (ref 78.0–100.0)
Platelets: 329 10*3/uL (ref 150–400)
RBC: 4.49 MIL/uL (ref 3.87–5.11)
RDW: 14.6 % (ref 11.5–15.5)
WBC: 9.3 10*3/uL (ref 4.0–10.5)

## 2015-11-19 LAB — LIPASE, BLOOD: LIPASE: 19 U/L (ref 11–51)

## 2015-11-19 MED ORDER — CEPHALEXIN 250 MG PO CAPS: 250.0000 mg | ORAL_CAPSULE | Freq: Four times a day (QID) | ORAL | 0 refills | Status: DC

## 2015-11-19 MED ORDER — PHENAZOPYRIDINE HCL 200 MG PO TABS
200.0000 mg | ORAL_TABLET | Freq: Once | ORAL | Status: AC
  Administered 2015-11-19: 200 mg via ORAL
  Filled 2015-11-19: qty 1

## 2015-11-19 MED ORDER — CEPHALEXIN 250 MG PO CAPS
250.0000 mg | ORAL_CAPSULE | Freq: Once | ORAL | Status: AC
  Administered 2015-11-19: 250 mg via ORAL
  Filled 2015-11-19: qty 1

## 2015-11-19 MED ORDER — PHENAZOPYRIDINE HCL 200 MG PO TABS: 200.0000 mg | ORAL_TABLET | Freq: Three times a day (TID) | ORAL | 0 refills | Status: DC

## 2015-11-19 NOTE — ED Notes (Signed)
EKG obtained by NT given to MD.
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
## 2015-11-19 NOTE — ED Triage Notes (Signed)
Patient reports she started experiencing right lower abd pain, increase frequency of urination, hypertension (200/170's at home), and headache. Symptoms have been occurring since last week. Also, lost her sister last week. Pt reports she has been taking her blood pressure medication as prescribed.

## 2015-11-19 NOTE — ED Notes (Signed)
Pt complaining of chest pain -  Triage RN aware.

## 2015-11-19 NOTE — ED Notes (Signed)
Pt ambulated with cane to  [place] to wait for her ride. Alert and oriented upon discharge. Has prescriptions and discharge paper work at hand

## 2015-11-22 LAB — URINE CULTURE

## 2015-11-23 ENCOUNTER — Telehealth (HOSPITAL_BASED_OUTPATIENT_CLINIC_OR_DEPARTMENT_OTHER): Payer: Self-pay

## 2015-11-23 NOTE — Telephone Encounter (Signed)
Post ED Visit - Positive Culture Follow-up  Culture report reviewed by antimicrobial stewardship pharmacist:  []  Elenor Quinones, Pharm.D. []  Heide Guile, Pharm.D., BCPS [x]  Parks Neptune, Pharm.D. []  Alycia Rossetti, Pharm.D., BCPS []  [place], Pharm.D., BCPS, AAHIVP []  Legrand Como, Pharm.D., BCPS, AAHIVP []  Milus Glazier, Pharm.D. []  Stephens November, [place].D.  Positive urine culture Treated with Cephalexin, organism sensitive to the same and no further patient follow-up is required at this time.  Genia Del 11/23/2015, 9:26 AM

## 2015-12-31 IMAGING — CR DG CERVICAL SPINE COMPLETE 4+V
6 series · 6 of 6 positions shown · non-contrast
Comparison: None.

CLINICAL DATA: 58-year-old female with a history of neck pain.

EXAM:
CERVICAL SPINE  4+ VIEWS

[w cervical spine lat]
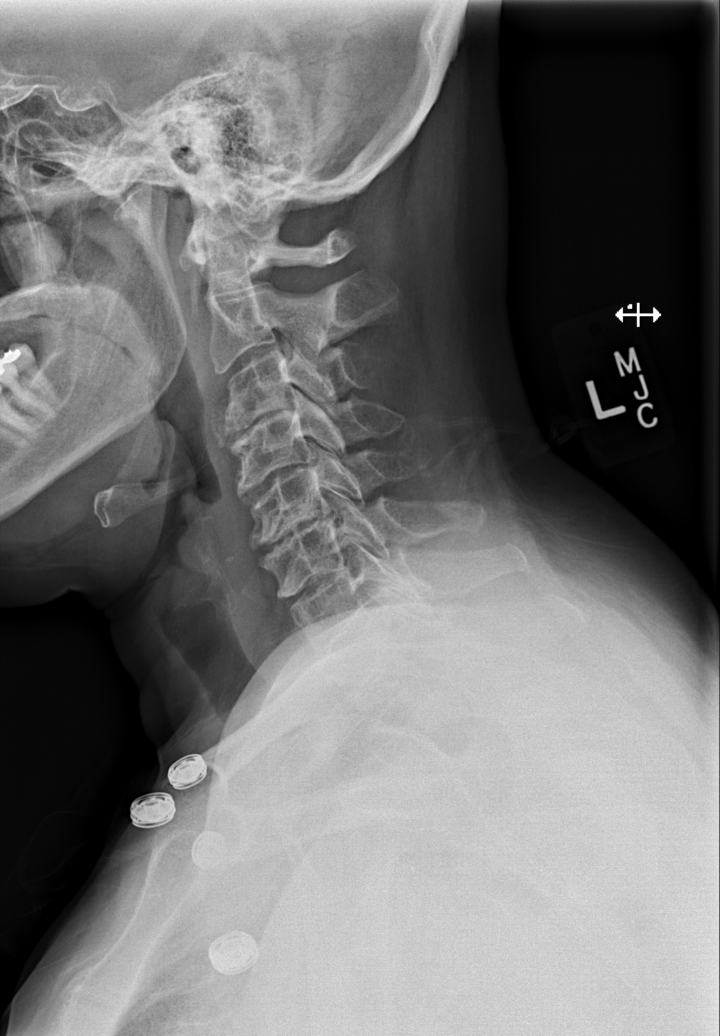

[w cervical spine ap_obl (1 of 2)]
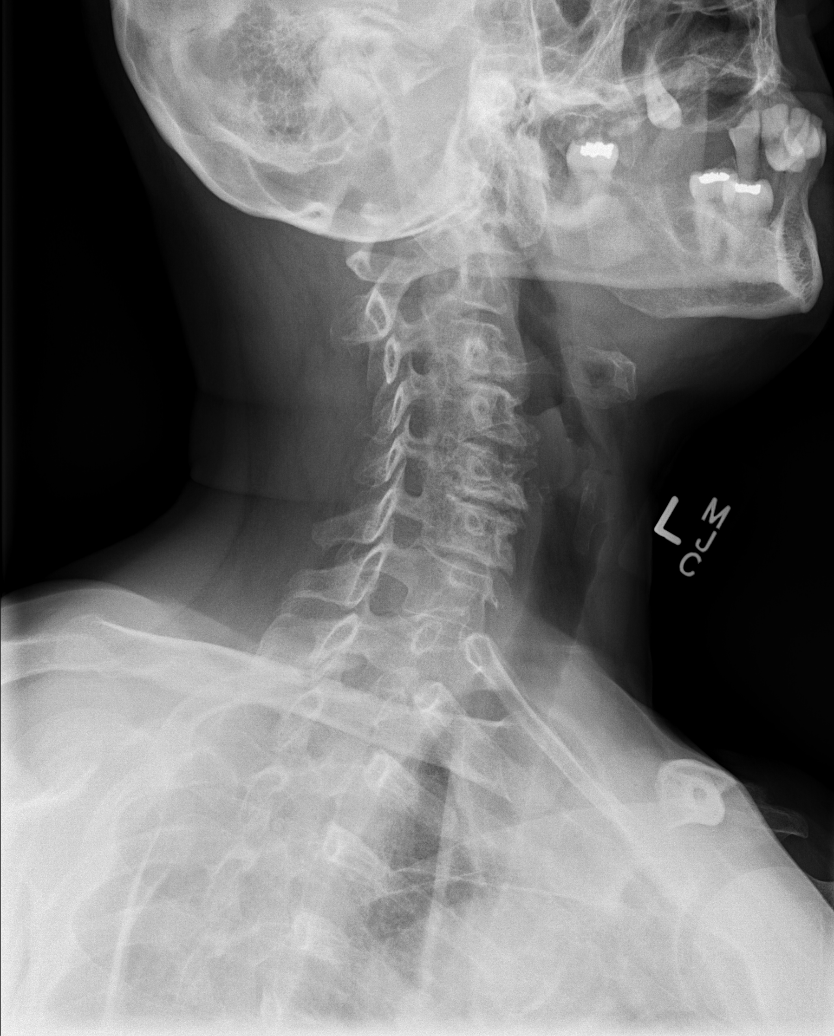

[w cervical spine ap_obl (2 of 2)]
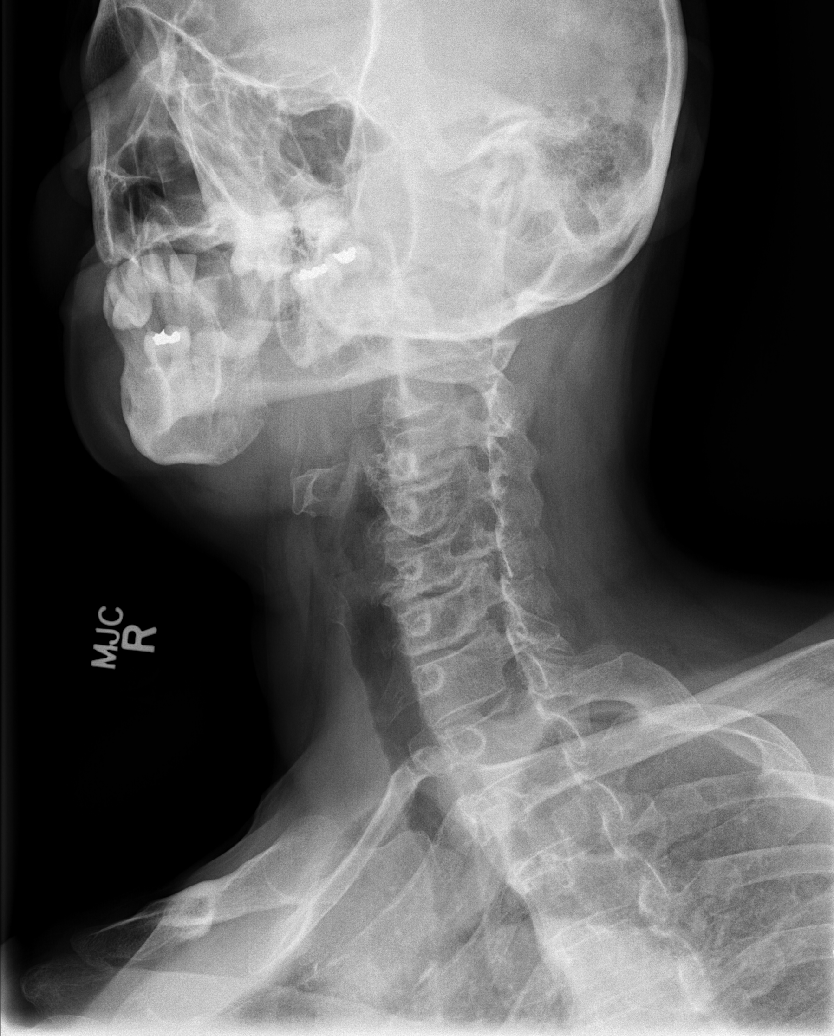

[w cervical spine ap]
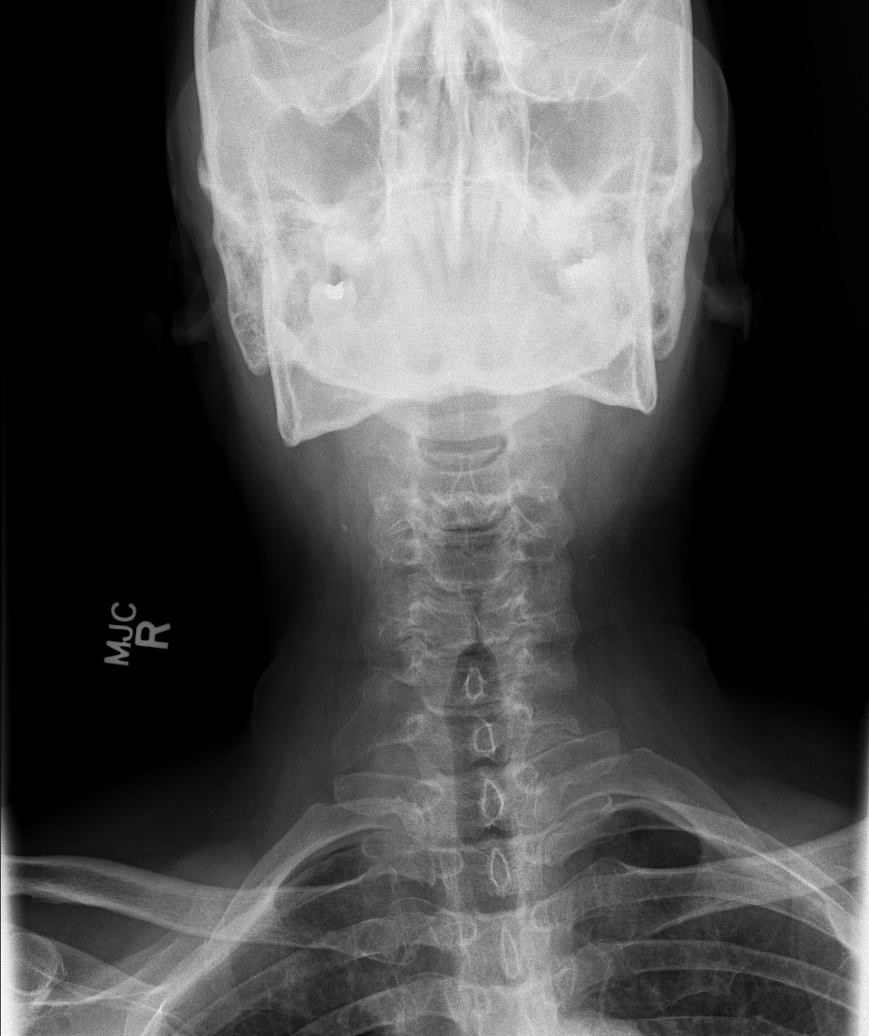

[w cervical spine odontoid]
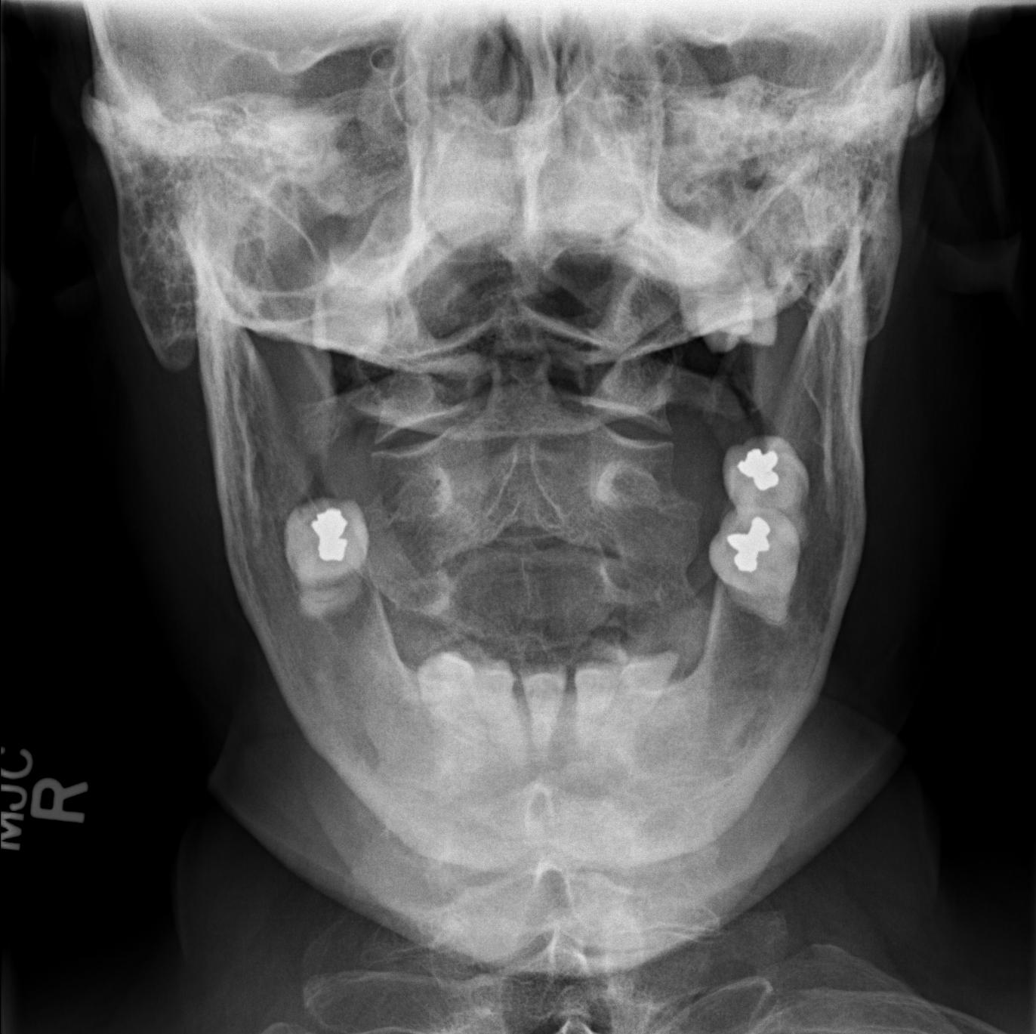

[w cervical swimmers]
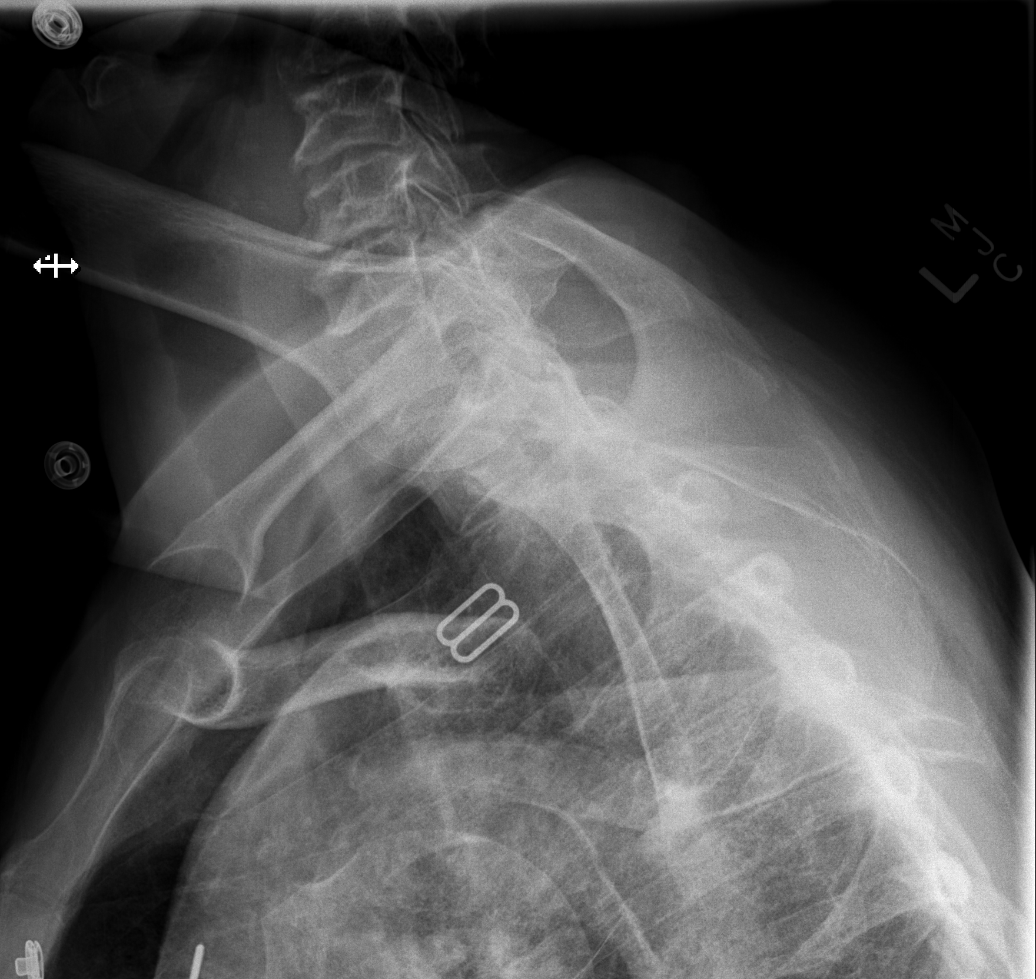

[6 of 6 positions shown; findings below may reference images not displayed]

FINDINGS: Cervical Spine:

Cervical elements from the level of the C1-T1 maintain alignment,
without subluxation, anterolisthesis, retrolisthesis.

No acute fracture line identified.

Unremarkable appearance of the craniocervical junction.

Vertebral body heights maintained.

Disc spaces are narrowed from C3-C7, with endplate changes,
uncovertebral joint disease, and anterior osteophyte production.

Oblique images demonstrate no significant right-sided foraminal
encroachment.

The left oblique images demonstrate questionable foraminal
encroachment from the level of C3- C7.

Prevertebral soft tissues within normal limits.
IMPRESSION: No radiographic evidence of acute fracture or malalignment of the
cervical spine.

Multilevel degenerative disc disease, with questionable foraminal
encroachment on the left spanning C3-C7.

## 2016-03-01 IMAGING — DX DG CHEST 2V
2 series · 2 of 2 positions shown · non-contrast
Comparison: February 28, 2014.

CLINICAL DATA: Acute chest pain.

EXAM:
CHEST  2 VIEW

[chest pa]
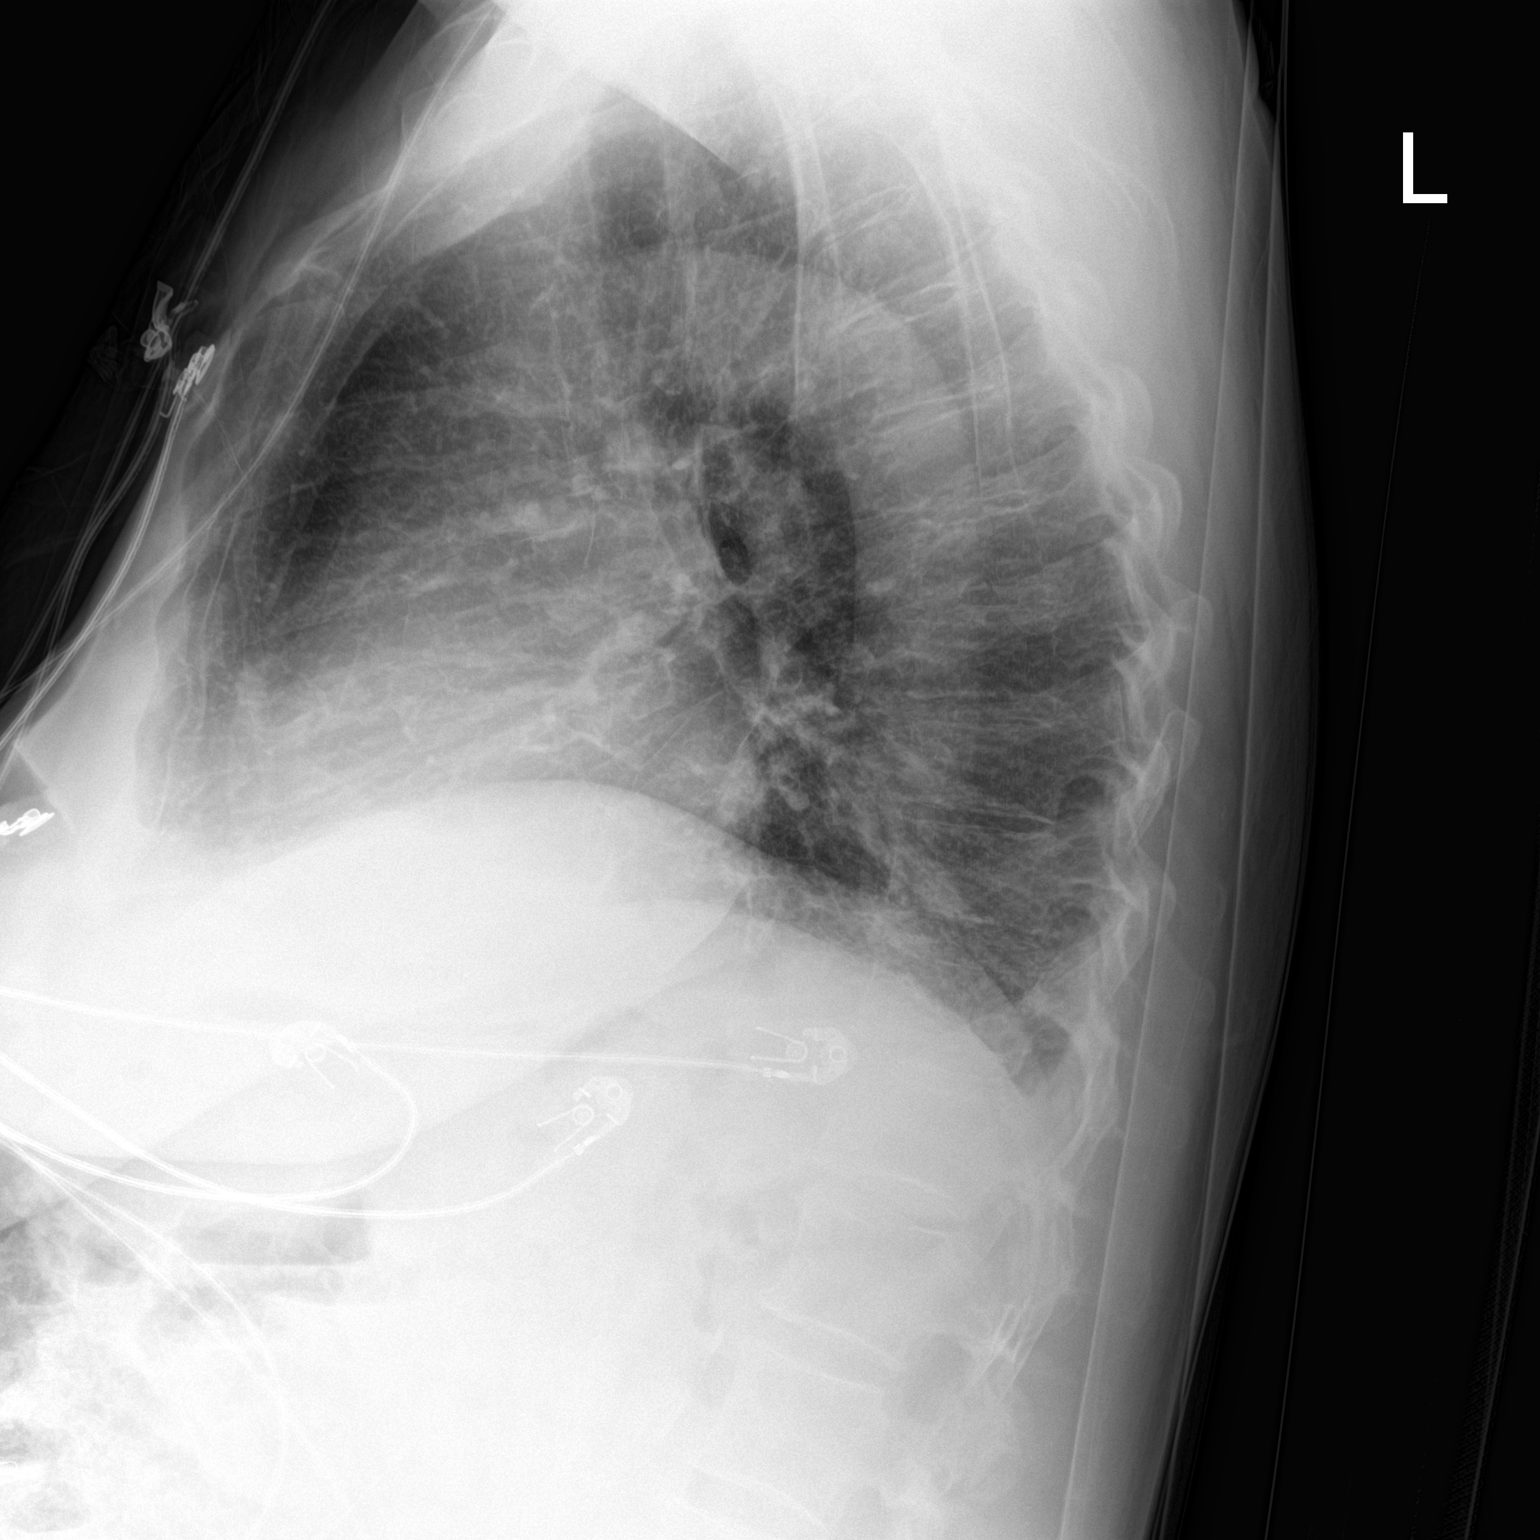

[chest ap]
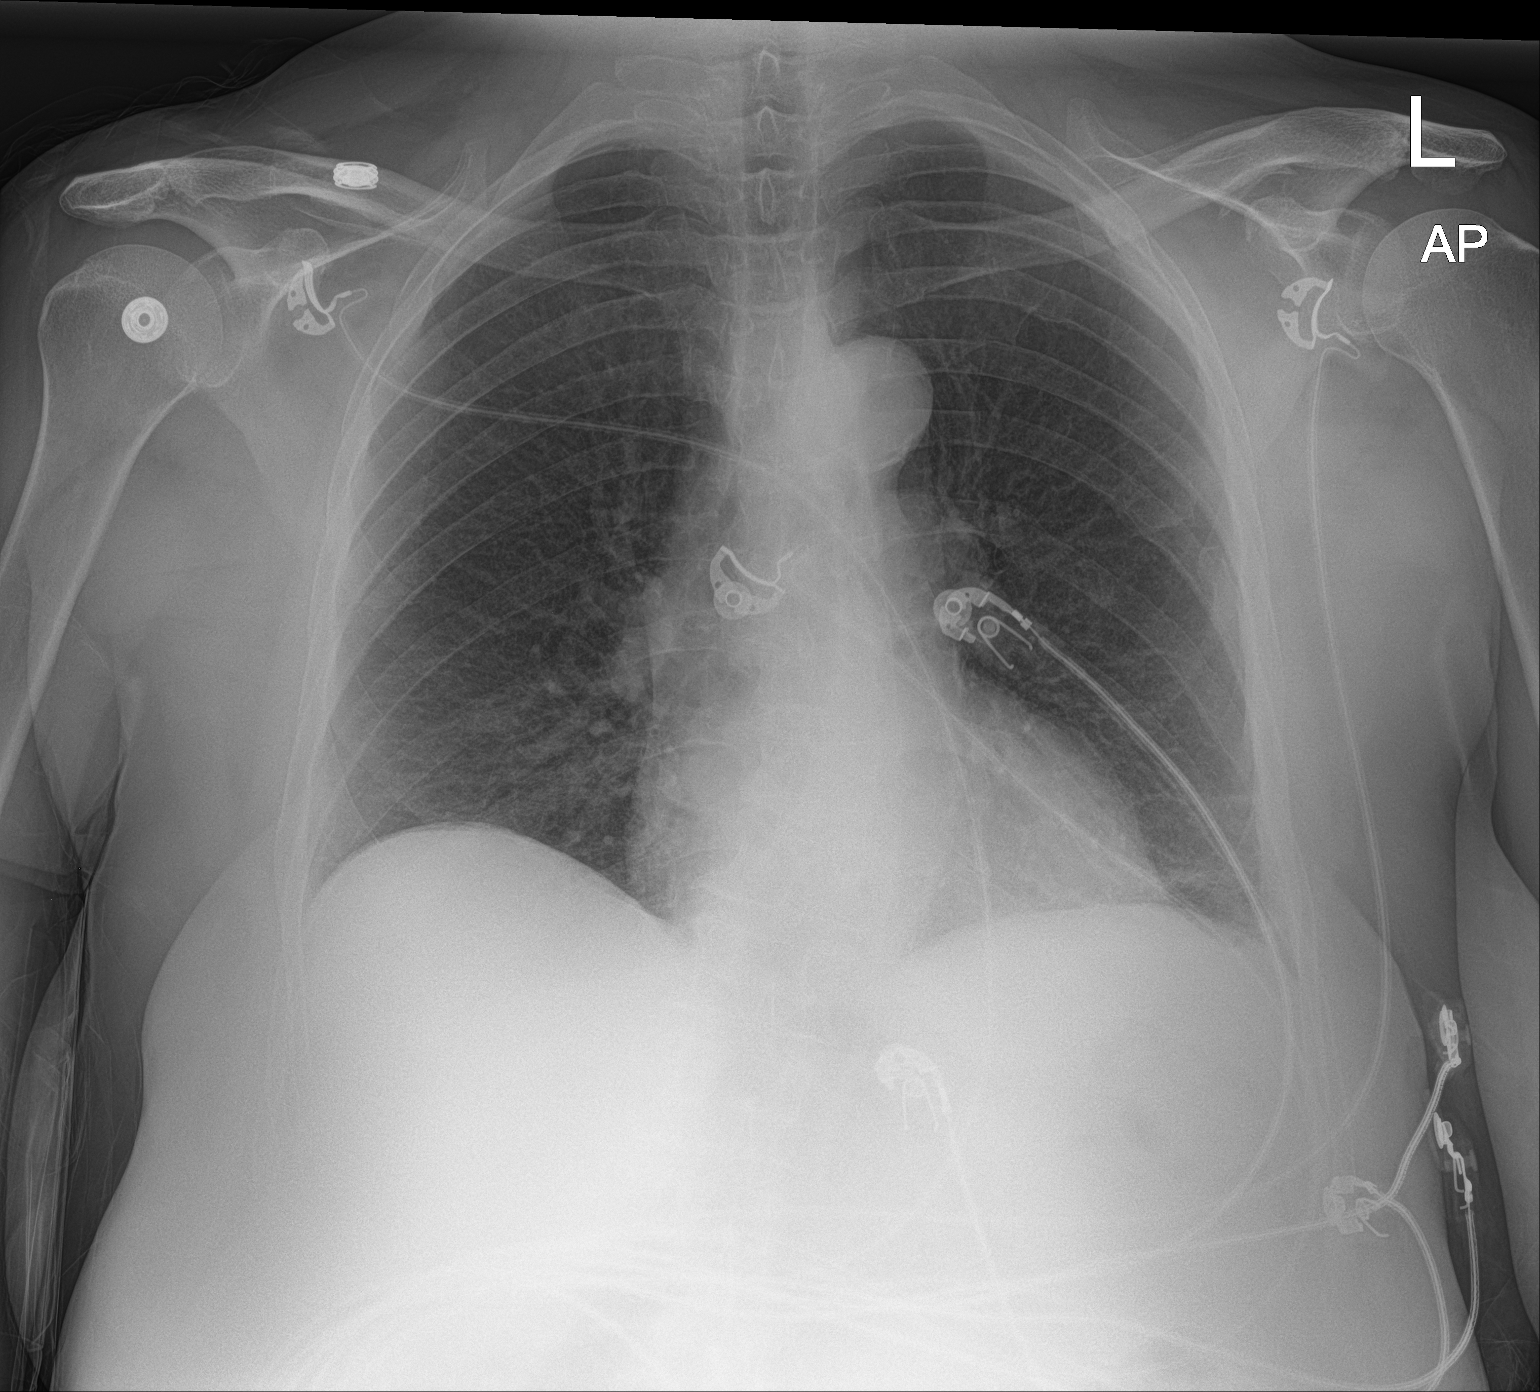

[2 of 2 positions shown; findings below may reference images not displayed]

FINDINGS: The heart size and mediastinal contours are within normal limits. No
pneumothorax or pleural effusion is noted. Right lung is clear.
Minimal left basilar subsegmental atelectasis is noted. The
visualized skeletal structures are unremarkable.
IMPRESSION: Minimal left basilar subsegmental atelectasis.

## 2016-03-15 ENCOUNTER — Emergency Department (HOSPITAL_COMMUNITY): Payer: Medicaid Other

## 2016-03-15 ENCOUNTER — Encounter (HOSPITAL_COMMUNITY): Payer: Self-pay | Admitting: Emergency Medicine

## 2016-03-15 ENCOUNTER — Emergency Department (HOSPITAL_COMMUNITY)
Admission: EM | Admit: 2016-03-15 | Discharge: 2016-03-15 | Disposition: A | Discharge status: Home / Self Care | Payer: Medicaid Other | Attending: Emergency Medicine | Admitting: Emergency Medicine

## 2016-03-15 DIAGNOSIS — Z7901 Long term (current) use of anticoagulants: Secondary | ICD-10-CM | POA: Diagnosis not present

## 2016-03-15 DIAGNOSIS — I129 Hypertensive chronic kidney disease with stage 1 through stage 4 chronic kidney disease, or unspecified chronic kidney disease: Secondary | ICD-10-CM | POA: Diagnosis not present

## 2016-03-15 DIAGNOSIS — R1084 Generalized abdominal pain: Secondary | ICD-10-CM

## 2016-03-15 DIAGNOSIS — Z79899 Other long term (current) drug therapy: Secondary | ICD-10-CM | POA: Diagnosis not present

## 2016-03-15 DIAGNOSIS — Z87891 Personal history of nicotine dependence: Secondary | ICD-10-CM | POA: Diagnosis not present

## 2016-03-15 DIAGNOSIS — Z7982 Long term (current) use of aspirin: Secondary | ICD-10-CM | POA: Insufficient documentation

## 2016-03-15 DIAGNOSIS — N183 Chronic kidney disease, stage 3 (moderate): Secondary | ICD-10-CM | POA: Insufficient documentation

## 2016-03-15 DIAGNOSIS — J449 Chronic obstructive pulmonary disease, unspecified: Secondary | ICD-10-CM | POA: Insufficient documentation

## 2016-03-15 DIAGNOSIS — N390 Urinary tract infection, site not specified: Secondary | ICD-10-CM

## 2016-03-15 DIAGNOSIS — R109 Unspecified abdominal pain: Secondary | ICD-10-CM | POA: Diagnosis present

## 2016-03-15 DIAGNOSIS — R319 Hematuria, unspecified: Secondary | ICD-10-CM | POA: Diagnosis not present

## 2016-03-15 LAB — HEPATIC FUNCTION PANEL
ALK PHOS: 108 U/L (ref 38–126)
ALT: 14 U/L (ref 14–54)
AST: 16 U/L (ref 15–41)
Albumin: 3.4 g/dL — ABNORMAL LOW (ref 3.5–5.0)
BILIRUBIN INDIRECT: 0.5 mg/dL (ref 0.3–0.9)
Bilirubin, Direct: 0.2 mg/dL (ref 0.1–0.5)
TOTAL PROTEIN: 7.1 g/dL (ref 6.5–8.1)
Total Bilirubin: 0.7 mg/dL (ref 0.3–1.2)

## 2016-03-15 LAB — CBC
HCT: 39.2 % (ref 36.0–46.0)
Hemoglobin: 13.6 g/dL (ref 12.0–15.0)
MCH: 29.6 pg (ref 26.0–34.0)
MCHC: 34.7 g/dL (ref 30.0–36.0)
MCV: 85.2 fL (ref 78.0–100.0)
PLATELETS: 304 10*3/uL (ref 150–400)
RBC: 4.6 MIL/uL (ref 3.87–5.11)
RDW: 14.2 % (ref 11.5–15.5)
WBC: 6.2 10*3/uL (ref 4.0–10.5)

## 2016-03-15 LAB — URINALYSIS, ROUTINE W REFLEX MICROSCOPIC
BILIRUBIN URINE: NEGATIVE
Glucose, UA: NEGATIVE mg/dL
KETONES UR: NEGATIVE mg/dL
Nitrite: POSITIVE — AB
PH: 5 (ref 5.0–8.0)
Protein, ur: NEGATIVE mg/dL
Specific Gravity, Urine: 1.018 (ref 1.005–1.030)

## 2016-03-15 LAB — I-STAT TROPONIN, ED: Troponin i, poc: 0 ng/mL (ref 0.00–0.08)

## 2016-03-15 LAB — BASIC METABOLIC PANEL
Anion gap: 7 (ref 5–15)
BUN: 16 mg/dL (ref 6–20)
CO2: 19 mmol/L — ABNORMAL LOW (ref 22–32)
CREATININE: 1.11 mg/dL — AB (ref 0.44–1.00)
Calcium: 9 mg/dL (ref 8.9–10.3)
Chloride: 112 mmol/L — ABNORMAL HIGH (ref 101–111)
GFR calc Af Amer: >60 mL/min (ref >60)
GFR, EST NON AFRICAN AMERICAN: 52 mL/min — AB (ref >60)
Glucose, Bld: 100 mg/dL — ABNORMAL HIGH (ref 65–99)
Potassium: 3.9 mmol/L (ref 3.5–5.1)
SODIUM: 138 mmol/L (ref 135–145)

## 2016-03-15 LAB — LIPASE, BLOOD: LIPASE: 22 U/L (ref 11–51)

## 2016-03-15 LAB — RAPID URINE DRUG SCREEN, HOSP PERFORMED
Amphetamines: NOT DETECTED
BENZODIAZEPINES: NOT DETECTED
Barbiturates: NOT DETECTED
COCAINE: POSITIVE — AB
Opiates: POSITIVE — AB
Tetrahydrocannabinol: NOT DETECTED

## 2016-03-15 MED ORDER — DIPHENHYDRAMINE HCL 50 MG/ML IJ SOLN
25.0000 mg | Freq: Once | INTRAMUSCULAR | Status: AC
  Administered 2016-03-15: 25 mg via INTRAVENOUS
  Filled 2016-03-15: qty 1

## 2016-03-15 MED ORDER — CEPHALEXIN 500 MG PO CAPS: 500.0000 mg | ORAL_CAPSULE | Freq: Four times a day (QID) | ORAL | 0 refills | Status: DC

## 2016-03-15 MED ORDER — PROMETHAZINE HCL 25 MG/ML IJ SOLN
12.5000 mg | Freq: Once | INTRAMUSCULAR | Status: AC
  Administered 2016-03-15: 12.5 mg via INTRAVENOUS
  Filled 2016-03-15: qty 1

## 2016-03-15 MED ORDER — LORAZEPAM 2 MG/ML IJ SOLN
0.5000 mg | Freq: Once | INTRAMUSCULAR | Status: AC
  Administered 2016-03-15: 0.5 mg via INTRAVENOUS
  Filled 2016-03-15: qty 1

## 2016-03-15 MED ORDER — MORPHINE SULFATE (PF) 4 MG/ML IV SOLN
4.0000 mg | Freq: Once | INTRAVENOUS | Status: AC
  Administered 2016-03-15: 4 mg via INTRAVENOUS
  Filled 2016-03-15: qty 1

## 2016-03-15 MED ORDER — SODIUM CHLORIDE 0.9 % IV SOLN
INTRAVENOUS | Status: DC
  Administered 2016-03-15: 22:00:00 via INTRAVENOUS

## 2016-03-15 MED ORDER — SODIUM CHLORIDE 0.9 % IV BOLUS (SEPSIS)
1000.0000 mL | Freq: Once | INTRAVENOUS | Status: AC
  Administered 2016-03-15: 1000 mL via INTRAVENOUS

## 2016-03-15 NOTE — ED Notes (Signed)
Per MD U/A order. Pt states unable to use restroom at this time. Advised pt sample needed, inform when able to provide. [employer]
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
## 2016-03-15 NOTE — ED Notes (Signed)
Pt. Claimed of itchiness on right arm soon after Morphine IV inj given, denied SOB, redness on site noted. To notify MD.

## 2016-03-15 NOTE — ED Triage Notes (Signed)
Patient c/o diarrhea x 4 days and vomiting since yesterday. Patient also states that she fell on Friday c/o  bilat forearms pain. Patient c/o intermittent chest pain since Wed

## 2016-03-21 IMAGING — CR DG CHEST 1V PORT
1 series · 1 of 1 positions shown · non-contrast
Comparison: 08/20/2014

CLINICAL DATA: Midsternal chest pain onset at [DATE]

EXAM:
PORTABLE CHEST - 1 VIEW

[AP]
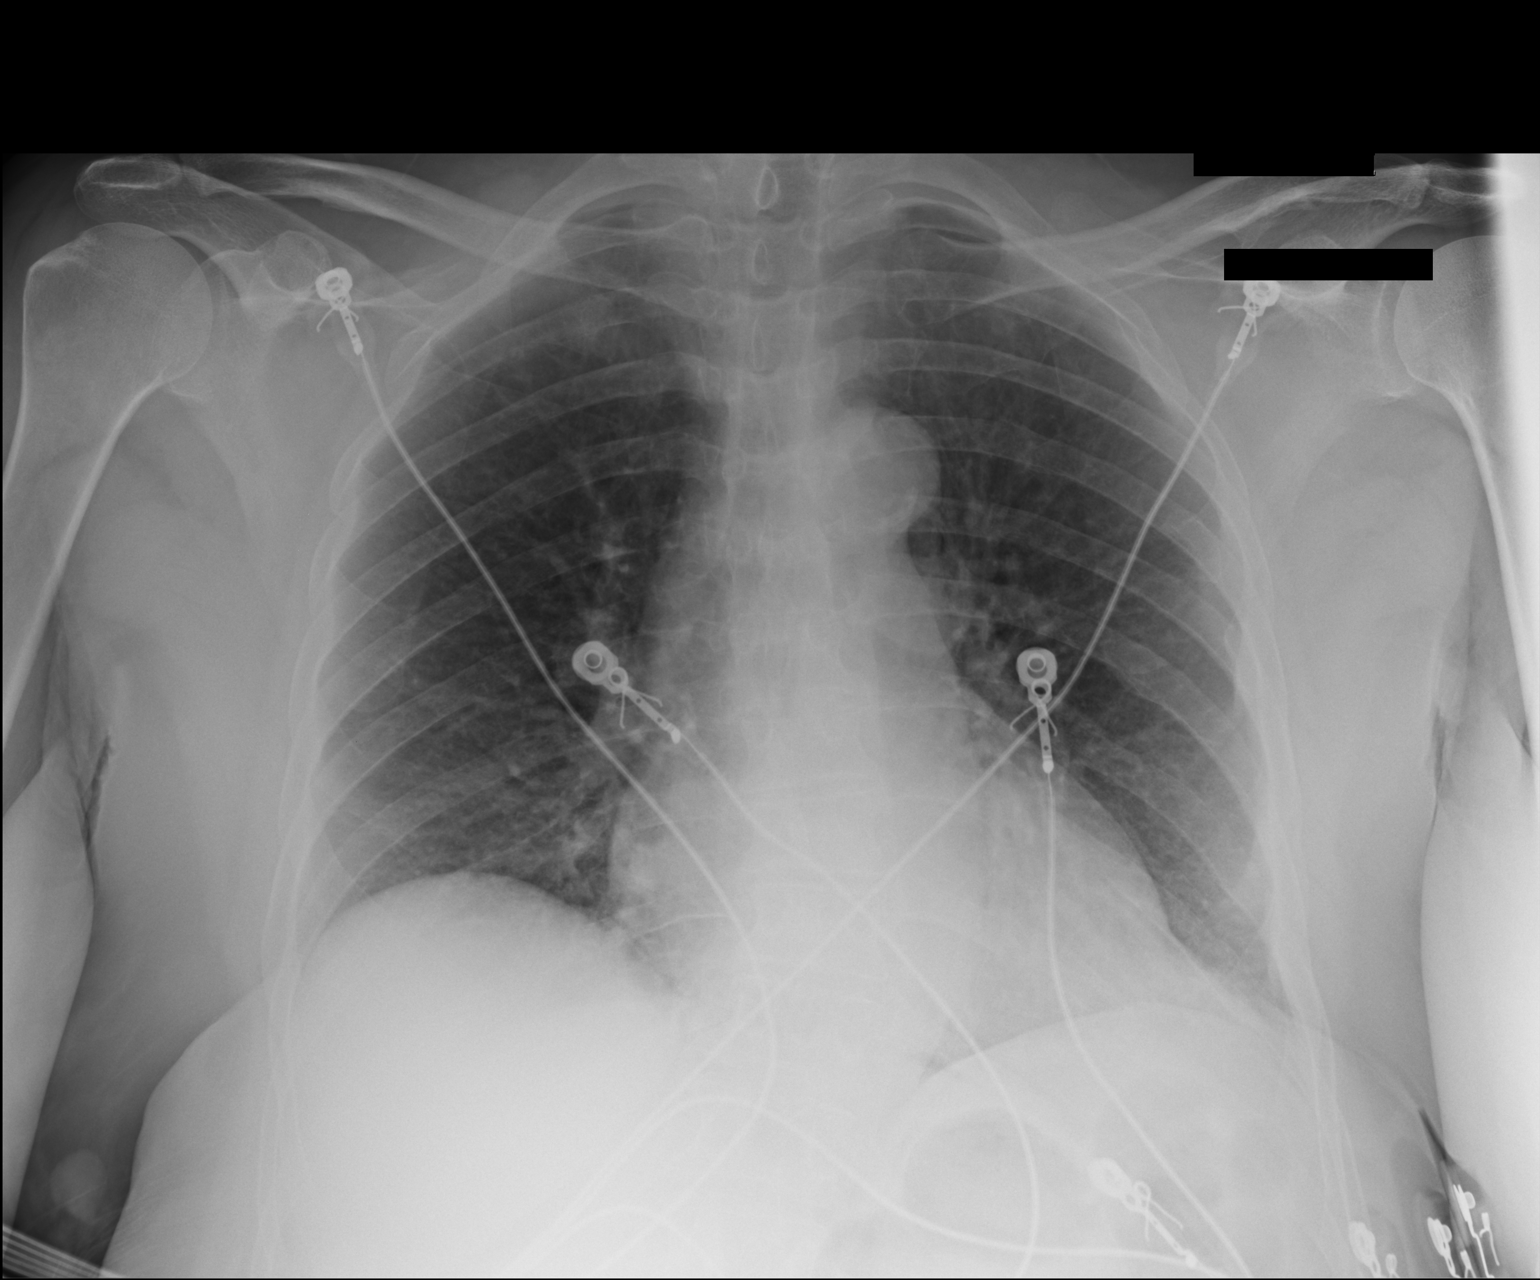

[1 of 1 positions shown; findings below may reference images not displayed]

FINDINGS: A single AP portable view of the chest demonstrates no focal
airspace consolidation or alveolar edema. The lungs are grossly
clear. There is no large effusion or pneumothorax. Cardiac and
mediastinal contours appear unremarkable.
IMPRESSION: No active disease.

## 2016-05-09 IMAGING — CT CT HEAD W/O CM
2 series · 16 of 30 positions shown, 20 images · non-contrast
Comparison: 02/28/2014

CLINICAL DATA: Altered mental status and lethargy. Diffuse head
pain.

EXAM:
CT HEAD WITHOUT CONTRAST
TECHNIQUE: Contiguous axial images were obtained from the base of the skull
through the vertex without intravenous contrast.

[Series 2: head w/o · axial · non-contrast · 0.45mm/px · z∈[+1325,+1445]mm · 13 of 30 slices shown, 17 images]
[im 3/30  brain]
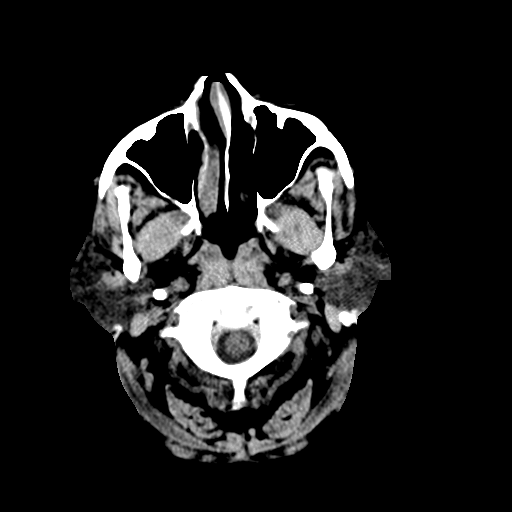
[im 3/30  bone]
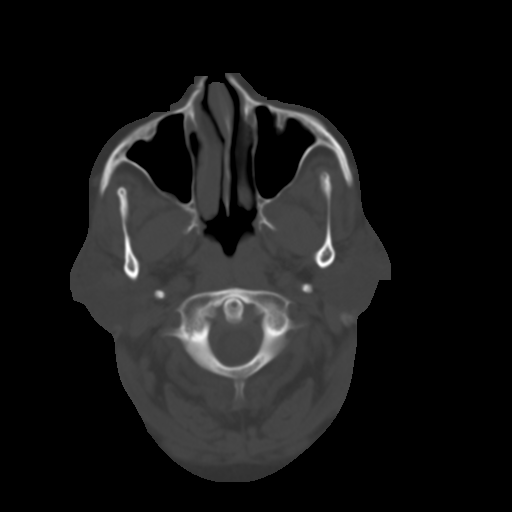
[im 5/30  brain]
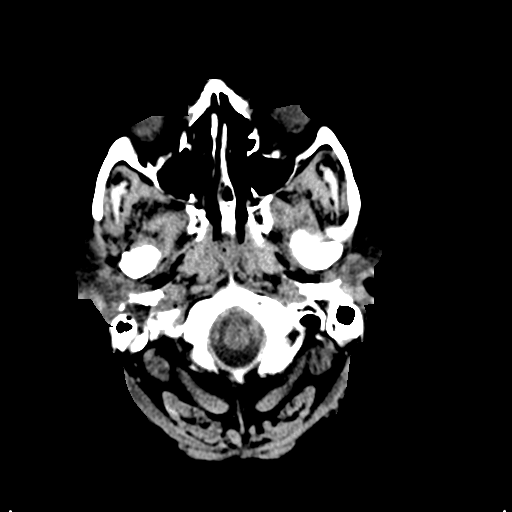
[im 7/30  brain]
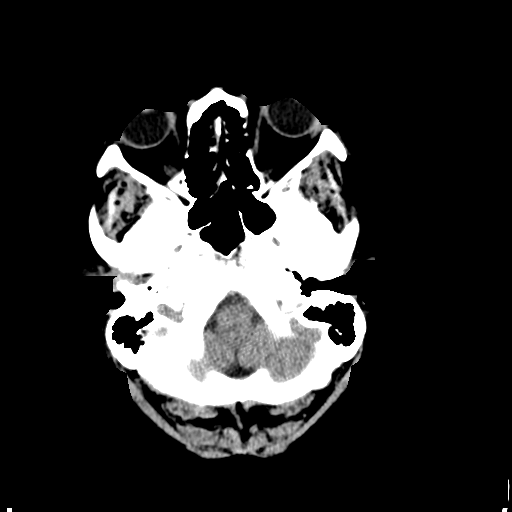
[im 9/30  brain]
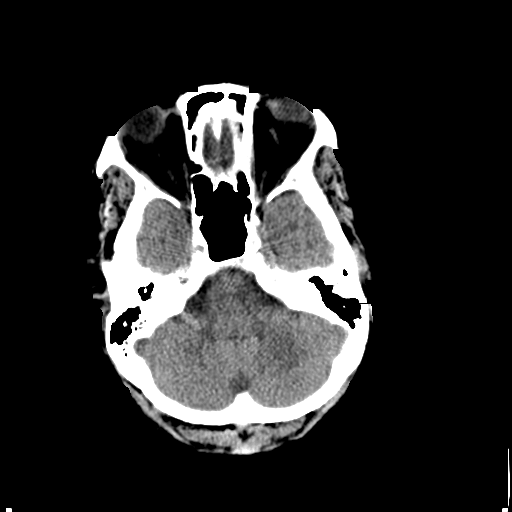
[im 11/30  brain]
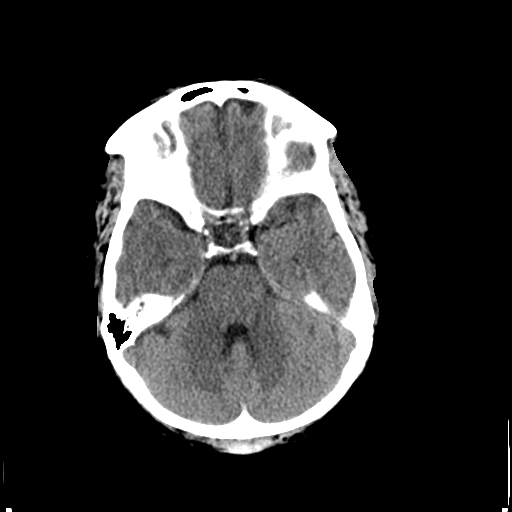
[im 11/30  bone]
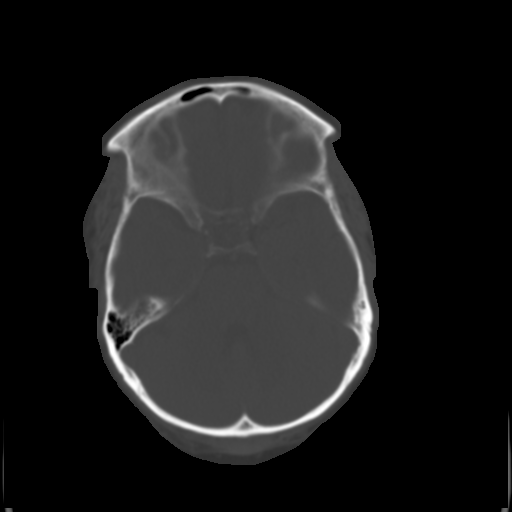
[im 13/30  brain]
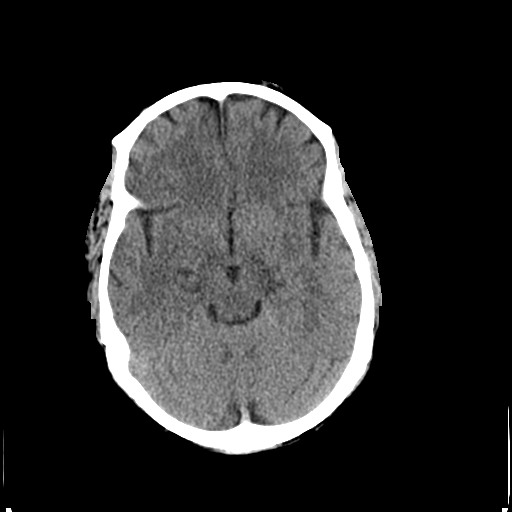
[im 15/30  brain]
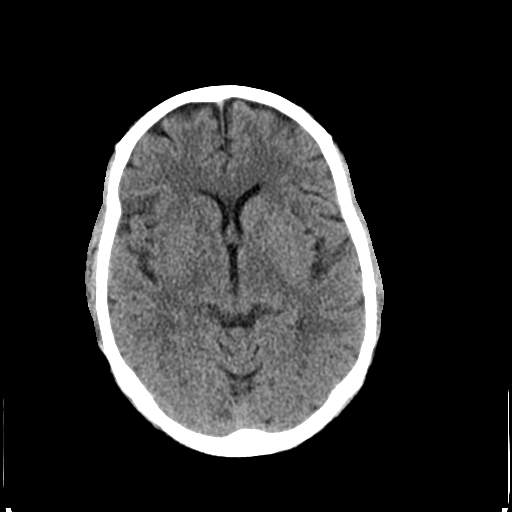
[im 17/30  brain]
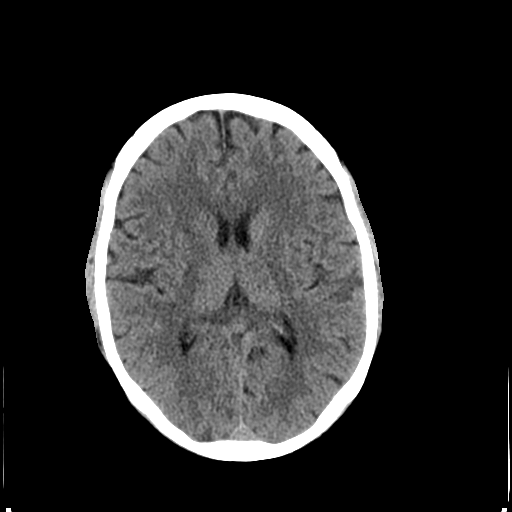
[im 19/30  brain]
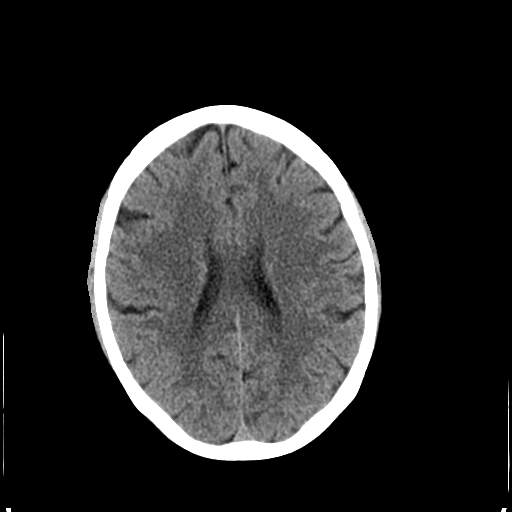
[im 19/30  bone]
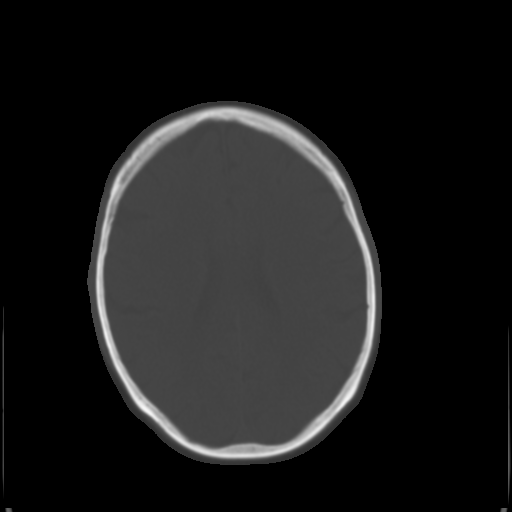
[im 21/30  brain]
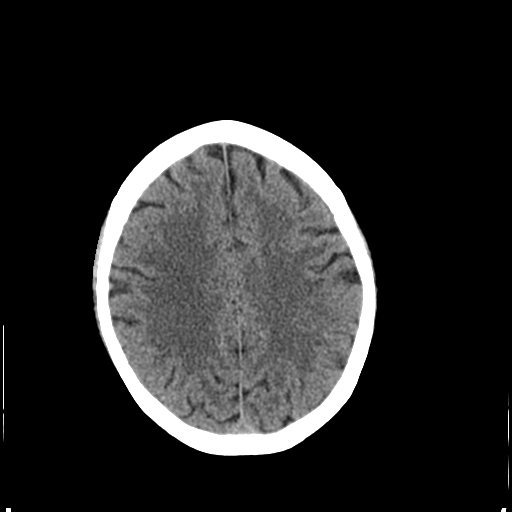
[im 23/30  brain]
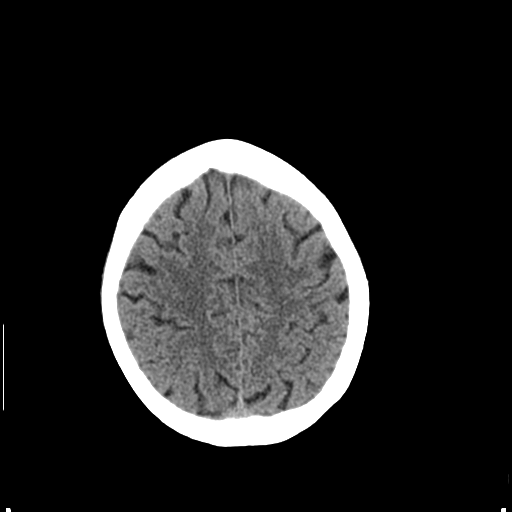
[im 25/30  brain]
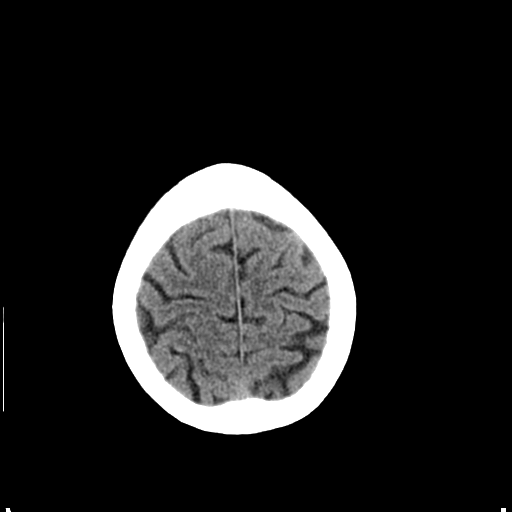
[im 27/30  brain]
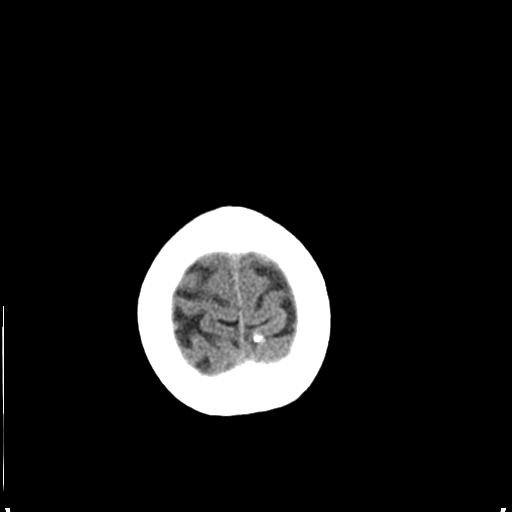
[im 27/30  bone]
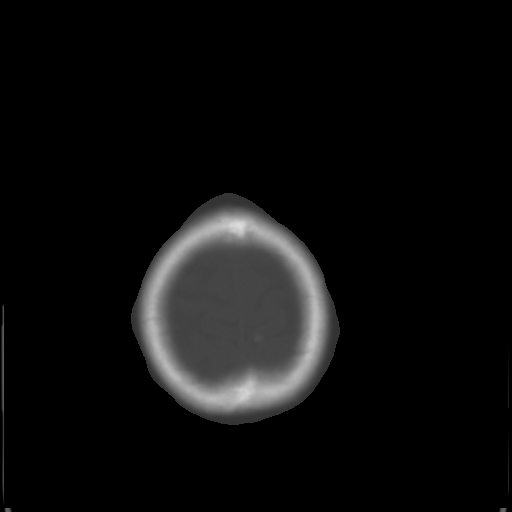

[Series 3: bone windows · axial · 0.45mm/px · z∈[+1325,+1365]mm · 3 of 30 slices shown]
[im 3/30  bone]
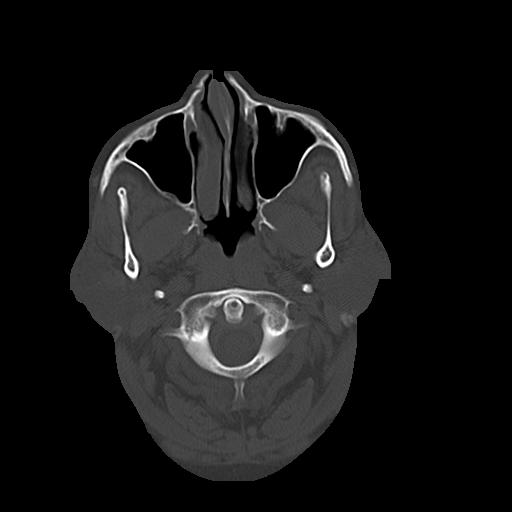
[im 7/30  bone]
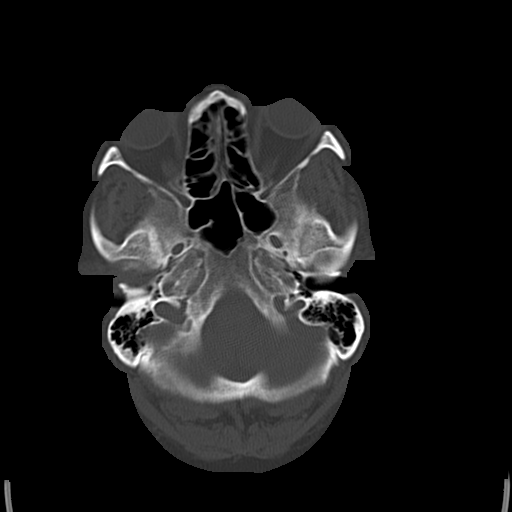
[im 11/30  bone]
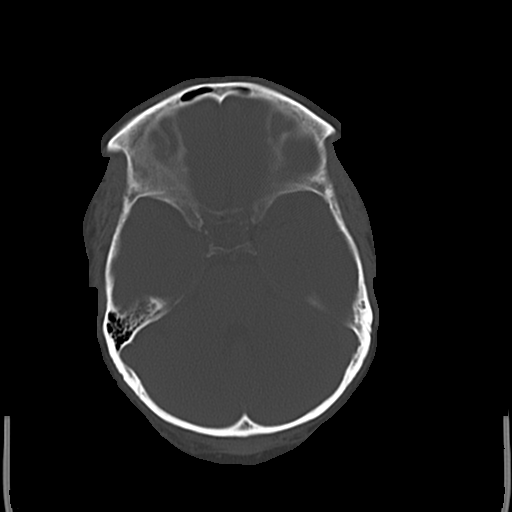

[16 of 30 positions shown; findings below may reference images not displayed]

FINDINGS: No intracranial hemorrhage, mass effect, or midline shift. No
hydrocephalus. The basilar cisterns are patent. No evidence of
territorial infarct. No intracranial fluid collection.
Atherosclerosis of the skullbase vasculature again seen. Calvarium
is intact. Included paranasal sinuses and mastoid air cells are well
aerated.
IMPRESSION: No acute intracranial abnormality.

## 2016-05-09 IMAGING — CR DG CHEST 2V
2 series · 2 of 2 positions shown · non-contrast
Comparison: Chest radiograph performed 09/09/2014

CLINICAL DATA: Acute onset of cough, generalized chest pain, fever,
numbness and body aches. Initial encounter.

EXAM:
CHEST  2 VIEW

[w chest lat]
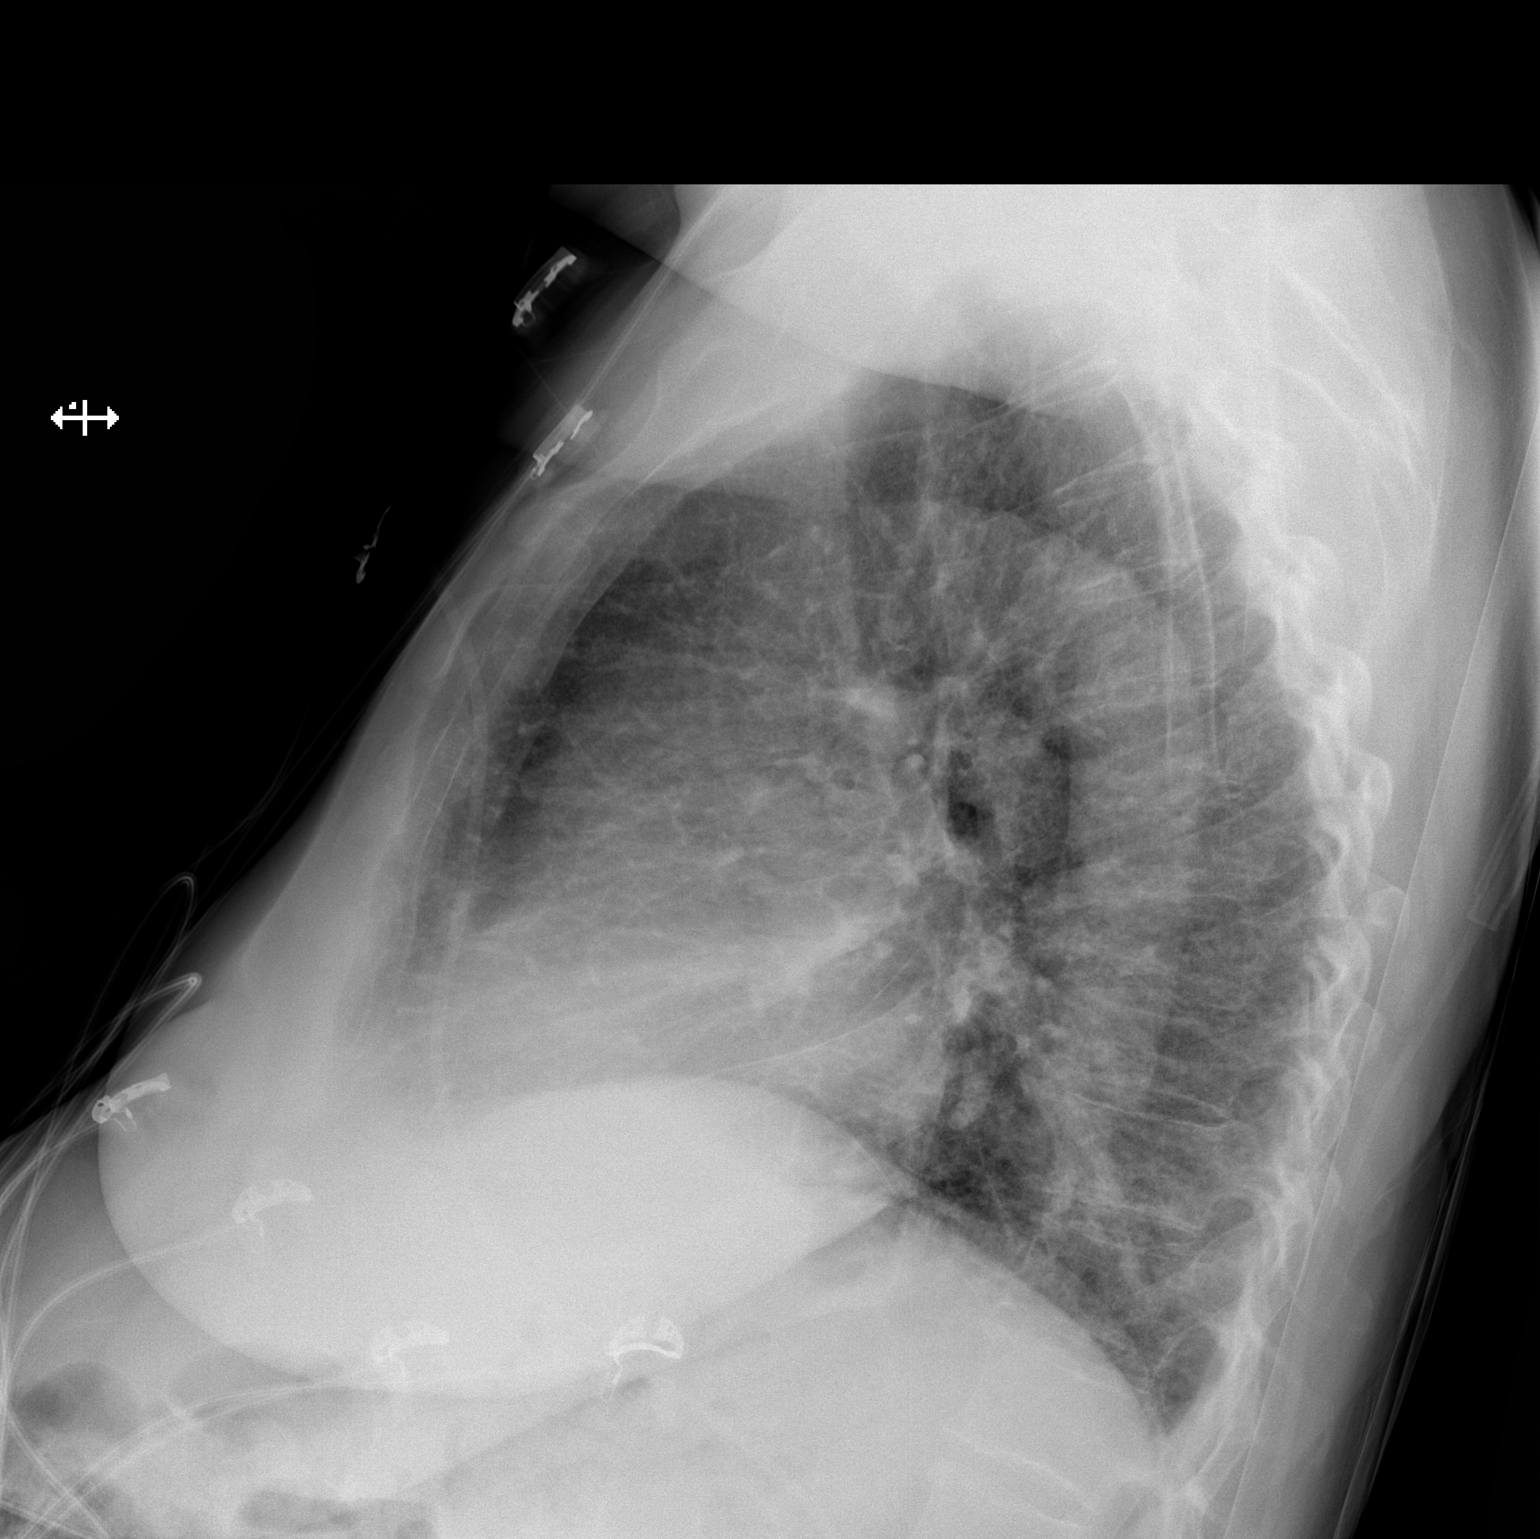

[x chest ap]
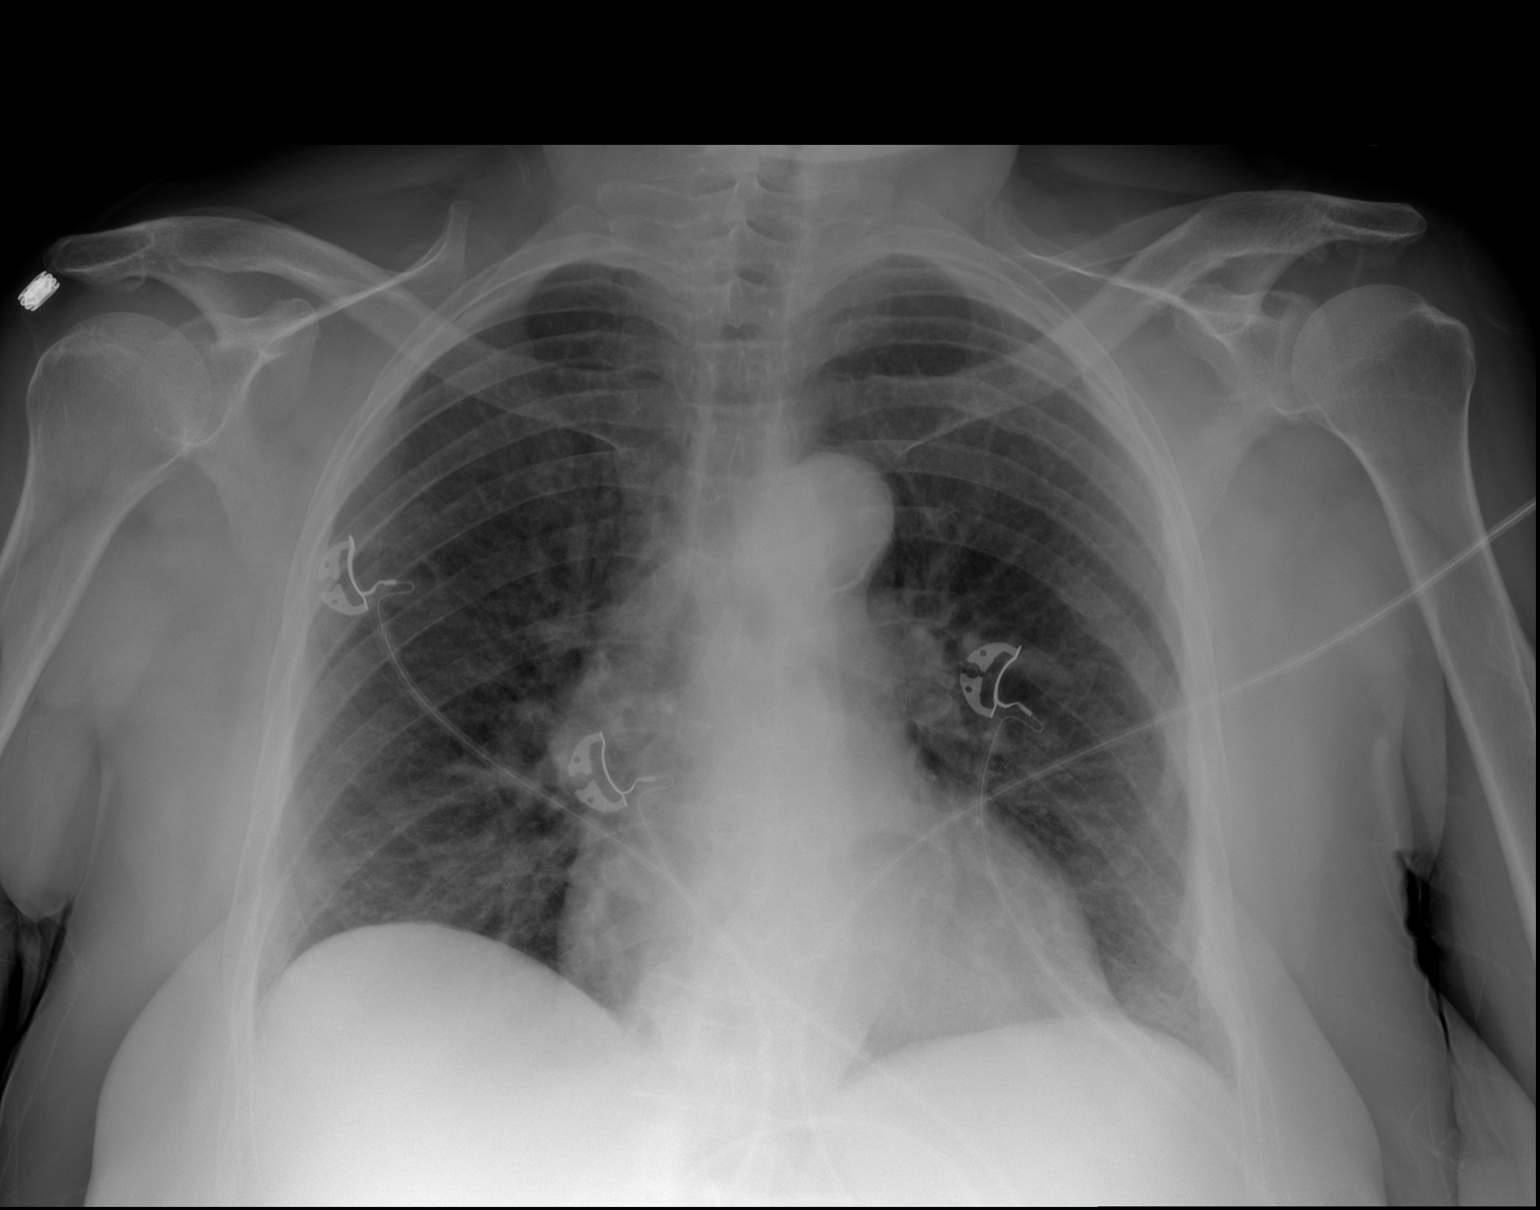

[2 of 2 positions shown; findings below may reference images not displayed]

FINDINGS: The lungs are well-aerated and clear. There is no evidence of focal
opacification, pleural effusion or pneumothorax.

The heart is borderline normal in size. No acute osseous
abnormalities are seen.
IMPRESSION: No acute cardiopulmonary process seen.

## 2016-07-26 ENCOUNTER — Other Ambulatory Visit: Payer: Self-pay | Admitting: Internal Medicine

## 2016-07-26 DIAGNOSIS — Z0000 Encounter for general adult medical examination without abnormal findings: Secondary | ICD-10-CM

## 2016-08-02 ENCOUNTER — Ambulatory Visit (HOSPITAL_COMMUNITY)
Admission: EM | Admit: 2016-08-02 | Discharge: 2016-08-02 | Disposition: A | Discharge status: Home / Self Care | Payer: Medicaid Other | Attending: Family Medicine | Admitting: Family Medicine

## 2016-08-02 ENCOUNTER — Encounter (HOSPITAL_COMMUNITY): Payer: Self-pay | Admitting: *Deleted

## 2016-08-02 ENCOUNTER — Ambulatory Visit
Admission: RE | Admit: 2016-08-02 | Discharge: 2016-08-02 | Disposition: A | Discharge status: Home / Self Care | Payer: Medicaid Other | Source: Ambulatory Visit | Attending: Internal Medicine | Admitting: Internal Medicine

## 2016-08-02 DIAGNOSIS — J449 Chronic obstructive pulmonary disease, unspecified: Secondary | ICD-10-CM | POA: Insufficient documentation

## 2016-08-02 DIAGNOSIS — Z87891 Personal history of nicotine dependence: Secondary | ICD-10-CM | POA: Insufficient documentation

## 2016-08-02 DIAGNOSIS — M069 Rheumatoid arthritis, unspecified: Secondary | ICD-10-CM | POA: Insufficient documentation

## 2016-08-02 DIAGNOSIS — R05 Cough: Secondary | ICD-10-CM | POA: Diagnosis not present

## 2016-08-02 DIAGNOSIS — Z888 Allergy status to other drugs, medicaments and biological substances status: Secondary | ICD-10-CM | POA: Diagnosis not present

## 2016-08-02 DIAGNOSIS — Z0000 Encounter for general adult medical examination without abnormal findings: Secondary | ICD-10-CM

## 2016-08-02 DIAGNOSIS — J029 Acute pharyngitis, unspecified: Secondary | ICD-10-CM | POA: Diagnosis not present

## 2016-08-02 DIAGNOSIS — Z885 Allergy status to narcotic agent status: Secondary | ICD-10-CM | POA: Insufficient documentation

## 2016-08-02 DIAGNOSIS — N183 Chronic kidney disease, stage 3 (moderate): Secondary | ICD-10-CM | POA: Insufficient documentation

## 2016-08-02 DIAGNOSIS — I129 Hypertensive chronic kidney disease with stage 1 through stage 4 chronic kidney disease, or unspecified chronic kidney disease: Secondary | ICD-10-CM | POA: Insufficient documentation

## 2016-08-02 DIAGNOSIS — H9203 Otalgia, bilateral: Secondary | ICD-10-CM | POA: Diagnosis not present

## 2016-08-02 DIAGNOSIS — Z880 Allergy status to penicillin: Secondary | ICD-10-CM | POA: Insufficient documentation

## 2016-08-02 DIAGNOSIS — J209 Acute bronchitis, unspecified: Secondary | ICD-10-CM | POA: Insufficient documentation

## 2016-08-02 DIAGNOSIS — K219 Gastro-esophageal reflux disease without esophagitis: Secondary | ICD-10-CM | POA: Insufficient documentation

## 2016-08-02 DIAGNOSIS — R059 Cough, unspecified: Secondary | ICD-10-CM

## 2016-08-02 LAB — POCT RAPID STREP A: STREPTOCOCCUS, GROUP A SCREEN (DIRECT): NEGATIVE

## 2016-08-02 MED ORDER — BENZONATATE 100 MG PO CAPS: 100.0000 mg | ORAL_CAPSULE | Freq: Three times a day (TID) | ORAL | 0 refills | Status: DC

## 2016-08-02 MED ORDER — LIDOCAINE VISCOUS 2 % MT SOLN: OROMUCOSAL | 0 refills | Status: DC

## 2016-08-02 MED ORDER — AZITHROMYCIN 250 MG PO TABS: 250.0000 mg | ORAL_TABLET | Freq: Every day | ORAL | 0 refills | Status: DC

## 2016-08-02 NOTE — ED Triage Notes (Signed)
Pt  Reports   sorethroat     And  Bilateral   Earache  X  3 days     Pt  Reports  Pain   When  She   S[place]ows
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
## 2016-08-05 LAB — CULTURE, GROUP A STREP (THRC)

## 2016-08-19 ENCOUNTER — Emergency Department (HOSPITAL_COMMUNITY)
Admission: EM | Admit: 2016-08-19 | Discharge: 2016-08-19 | Disposition: A | Discharge status: Home / Self Care | Payer: Medicaid Other | Attending: Emergency Medicine | Admitting: Emergency Medicine

## 2016-08-19 DIAGNOSIS — J449 Chronic obstructive pulmonary disease, unspecified: Secondary | ICD-10-CM | POA: Insufficient documentation

## 2016-08-19 DIAGNOSIS — R109 Unspecified abdominal pain: Secondary | ICD-10-CM | POA: Insufficient documentation

## 2016-08-19 DIAGNOSIS — I129 Hypertensive chronic kidney disease with stage 1 through stage 4 chronic kidney disease, or unspecified chronic kidney disease: Secondary | ICD-10-CM | POA: Insufficient documentation

## 2016-08-19 DIAGNOSIS — M5412 Radiculopathy, cervical region: Secondary | ICD-10-CM | POA: Insufficient documentation

## 2016-08-19 DIAGNOSIS — Z79899 Other long term (current) drug therapy: Secondary | ICD-10-CM | POA: Diagnosis not present

## 2016-08-19 DIAGNOSIS — Z87891 Personal history of nicotine dependence: Secondary | ICD-10-CM | POA: Diagnosis not present

## 2016-08-19 DIAGNOSIS — R300 Dysuria: Secondary | ICD-10-CM | POA: Insufficient documentation

## 2016-08-19 DIAGNOSIS — R202 Paresthesia of skin: Secondary | ICD-10-CM | POA: Diagnosis not present

## 2016-08-19 DIAGNOSIS — N183 Chronic kidney disease, stage 3 (moderate): Secondary | ICD-10-CM | POA: Diagnosis not present

## 2016-08-19 DIAGNOSIS — Z7982 Long term (current) use of aspirin: Secondary | ICD-10-CM | POA: Insufficient documentation

## 2016-08-19 DIAGNOSIS — Z7901 Long term (current) use of anticoagulants: Secondary | ICD-10-CM | POA: Insufficient documentation

## 2016-08-19 LAB — URINALYSIS, ROUTINE W REFLEX MICROSCOPIC
Bilirubin Urine: NEGATIVE
GLUCOSE, UA: NEGATIVE mg/dL
HGB URINE DIPSTICK: NEGATIVE
KETONES UR: NEGATIVE mg/dL
Nitrite: NEGATIVE
PROTEIN: NEGATIVE mg/dL
Specific Gravity, Urine: 1.019 (ref 1.005–1.030)
pH: 6 (ref 5.0–8.0)

## 2016-08-19 LAB — COMPREHENSIVE METABOLIC PANEL
ALBUMIN: 3.8 g/dL (ref 3.5–5.0)
ALK PHOS: 104 U/L (ref 38–126)
ALT: 20 U/L (ref 14–54)
ANION GAP: 10 (ref 5–15)
AST: 19 U/L (ref 15–41)
BUN: 25 mg/dL — ABNORMAL HIGH (ref 6–20)
CALCIUM: 9.1 mg/dL (ref 8.9–10.3)
CHLORIDE: 112 mmol/L — AB (ref 101–111)
CO2: 17 mmol/L — AB (ref 22–32)
Creatinine, Ser: 1.45 mg/dL — ABNORMAL HIGH (ref 0.44–1.00)
GFR calc non Af Amer: 38 mL/min — ABNORMAL LOW (ref >60)
GFR, EST AFRICAN AMERICAN: 44 mL/min — AB (ref >60)
GLUCOSE: 106 mg/dL — AB (ref 65–99)
POTASSIUM: 4.8 mmol/L (ref 3.5–5.1)
SODIUM: 139 mmol/L (ref 135–145)
Total Bilirubin: 0.4 mg/dL (ref 0.3–1.2)
Total Protein: 7.2 g/dL (ref 6.5–8.1)

## 2016-08-19 LAB — CBC
HEMATOCRIT: 42 % (ref 36.0–46.0)
HEMOGLOBIN: 14.1 g/dL (ref 12.0–15.0)
MCH: 29.6 pg (ref 26.0–34.0)
MCHC: 33.6 g/dL (ref 30.0–36.0)
MCV: 88.2 fL (ref 78.0–100.0)
PLATELETS: 266 10*3/uL (ref 150–400)
RBC: 4.76 MIL/uL (ref 3.87–5.11)
RDW: 14.6 % (ref 11.5–15.5)
WBC: 8.6 10*3/uL (ref 4.0–10.5)

## 2016-08-19 LAB — LIPASE, BLOOD: LIPASE: 23 U/L (ref 11–51)

## 2016-08-19 MED ORDER — DIPHENHYDRAMINE HCL 25 MG PO CAPS
25.0000 mg | ORAL_CAPSULE | Freq: Once | ORAL | Status: AC
  Administered 2016-08-19: 25 mg via ORAL
  Filled 2016-08-19: qty 1

## 2016-08-19 MED ORDER — PREDNISONE 20 MG PO TABS: 40.0000 mg | ORAL_TABLET | Freq: Every day | ORAL | 0 refills | Status: DC

## 2016-08-19 MED ORDER — OXYCODONE-ACETAMINOPHEN 5-325 MG PO TABS: 1.0000 | ORAL_TABLET | ORAL | 0 refills | Status: DC | PRN

## 2016-08-19 MED ORDER — METHOCARBAMOL 750 MG PO TABS: 750.0000 mg | ORAL_TABLET | Freq: Two times a day (BID) | ORAL | 0 refills | Status: DC

## 2016-08-19 MED ORDER — IBUPROFEN 400 MG PO TABS
400.0000 mg | ORAL_TABLET | Freq: Once | ORAL | Status: AC | PRN
  Administered 2016-08-19: 400 mg via ORAL

## 2016-08-19 MED ORDER — IBUPROFEN 400 MG PO TABS
ORAL_TABLET | ORAL | Status: AC
  Filled 2016-08-19: qty 1

## 2016-08-19 MED ORDER — HYDROMORPHONE HCL 1 MG/ML IJ SOLN
1.0000 mg | Freq: Once | INTRAMUSCULAR | Status: AC
Start: 2016-08-19 — End: 2016-08-19
  Administered 2016-08-19: 1 mg via INTRAMUSCULAR
  Filled 2016-08-19: qty 1

## 2016-08-19 MED ORDER — METHOCARBAMOL 500 MG PO TABS
750.0000 mg | ORAL_TABLET | Freq: Once | ORAL | Status: AC
  Administered 2016-08-19: 750 mg via ORAL
  Filled 2016-08-19: qty 2
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
## 2016-08-19 NOTE — ED Triage Notes (Signed)
Pt reports she hurts everywhere. Pt reports she had a neck surgery in March. Pt states she has had tingling to both arms since then. Pt also reports pain to her lower abdomen. Pt also reports nausea. Pt states she hurts everywhere. Crying in triage.

## 2016-08-19 NOTE — Discharge Instructions (Signed)
Continue pain medications as prescribed. Take muscle relaxants as prescribed as needed. Take prednisone as prescribed until all gone. Follow-up with Dr. Ronnald Ramp or your family doctor.

## 2016-08-19 NOTE — ED Notes (Signed)
EDP at bedside  

## 2016-08-26 IMAGING — DX DG CHEST 1V PORT
1 series · 1 of 1 positions shown · non-contrast
Comparison: 02/14/2015

CLINICAL DATA: Sepsis.  Recent cocaine abuse

EXAM:
PORTABLE CHEST 1 VIEW

[chest ap]
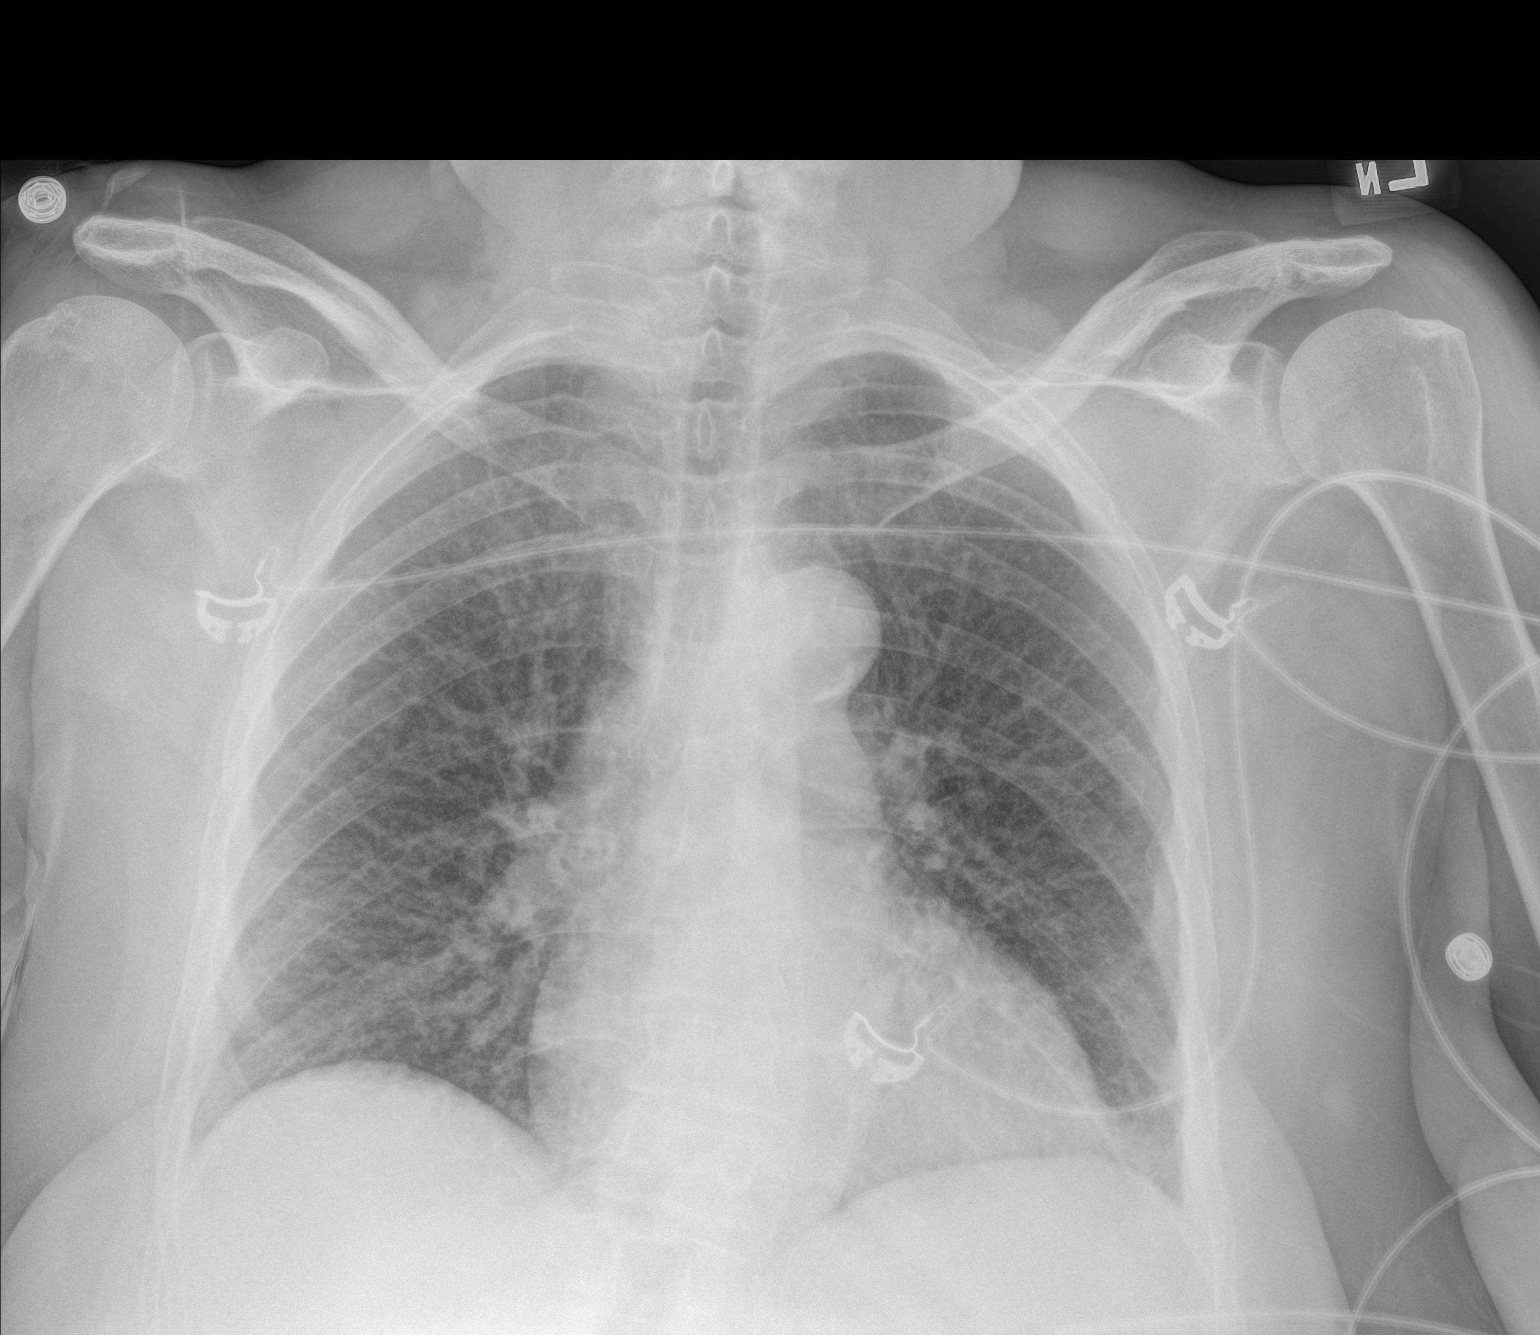

[1 of 1 positions shown; findings below may reference images not displayed]

FINDINGS: Heart size is normal. Aortic atherosclerosis noted. No pleural
effusion identified. There is mild diffuse edema. No airspace
consolidation.
IMPRESSION: Mild pulmonary edema.

## 2016-08-26 IMAGING — CR DG CHEST 2V
2 series · 2 of 2 positions shown · non-contrast
Comparison: 10/28/2014

CLINICAL DATA: Right chest pain, headache

EXAM:
CHEST  2 VIEW

[w chest lat]
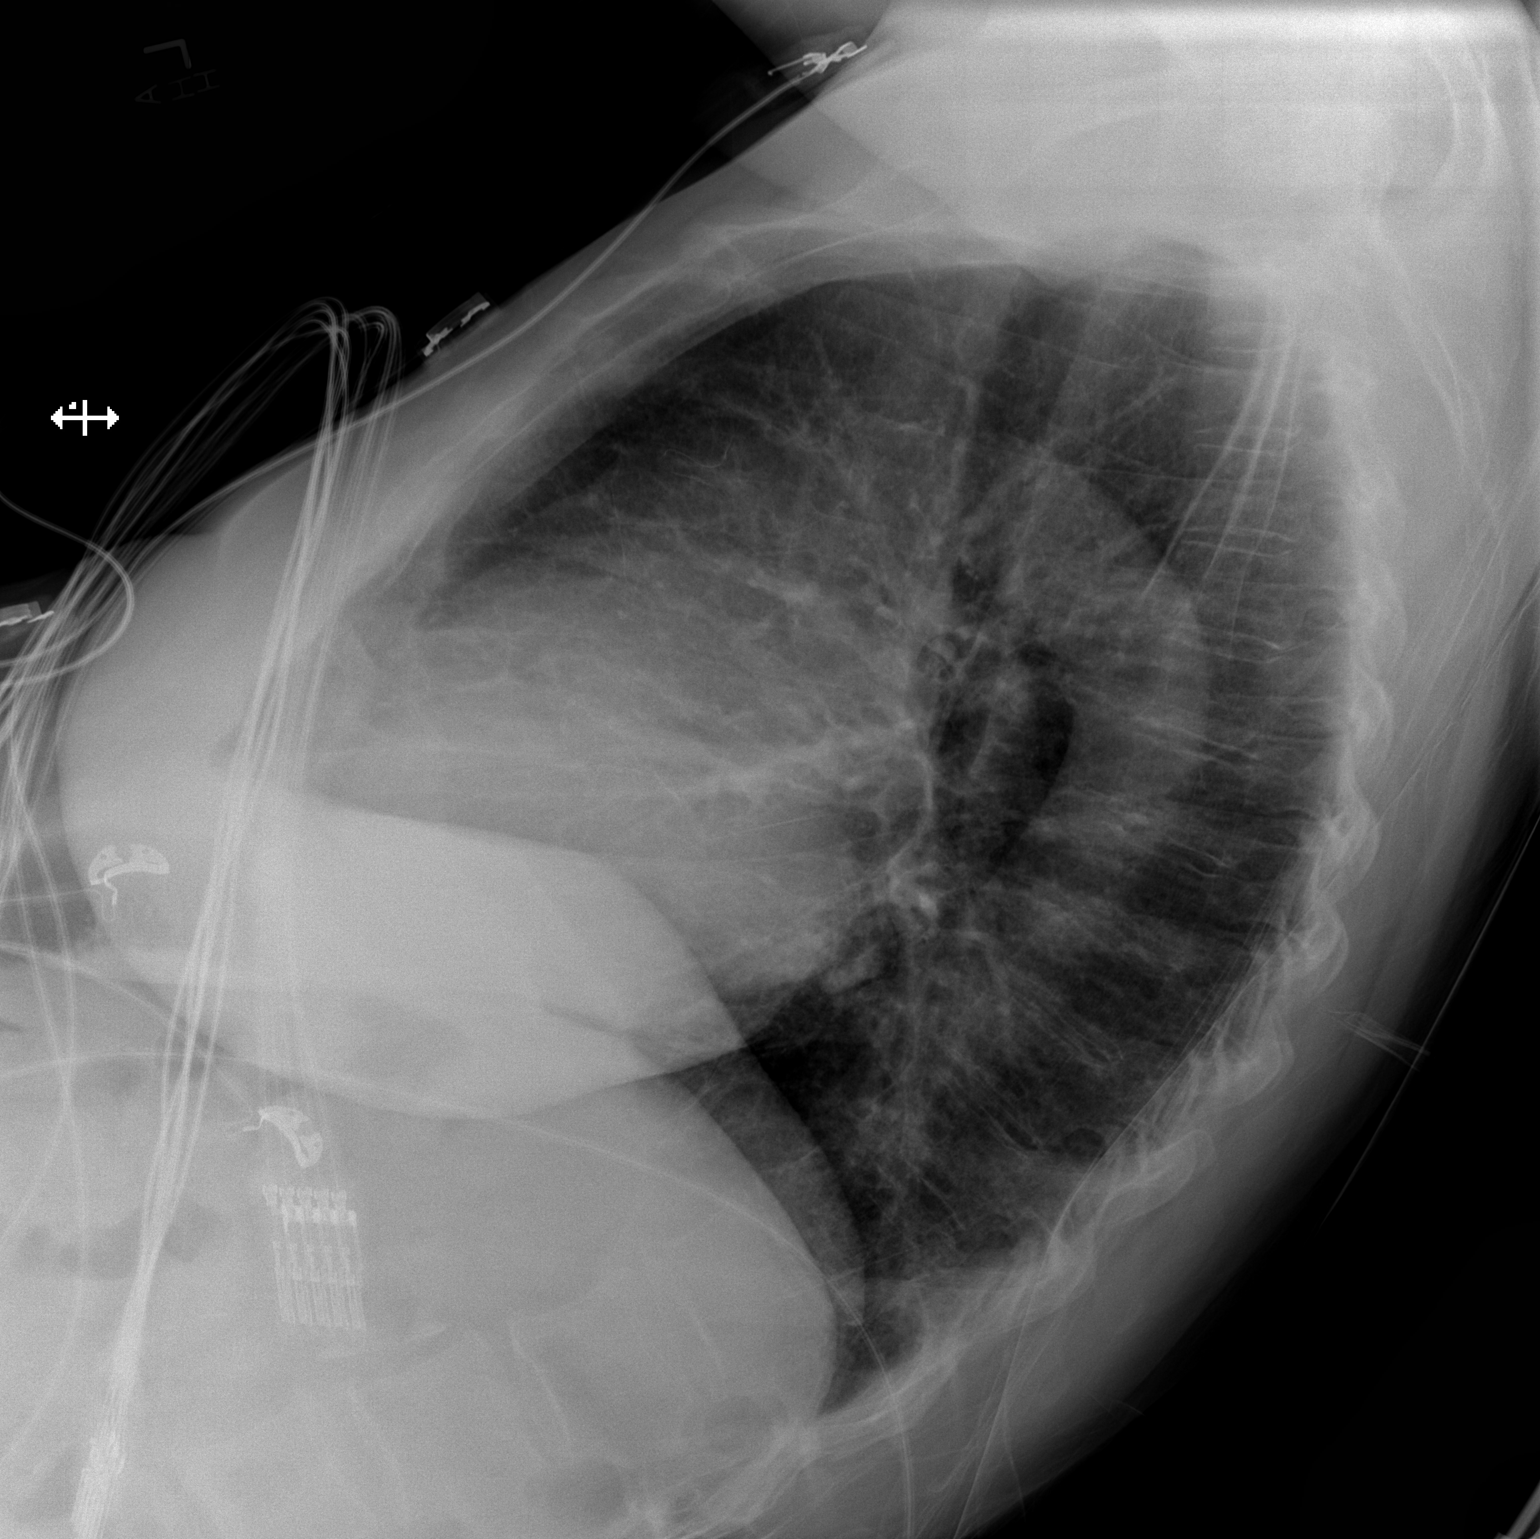

[x chest ap]
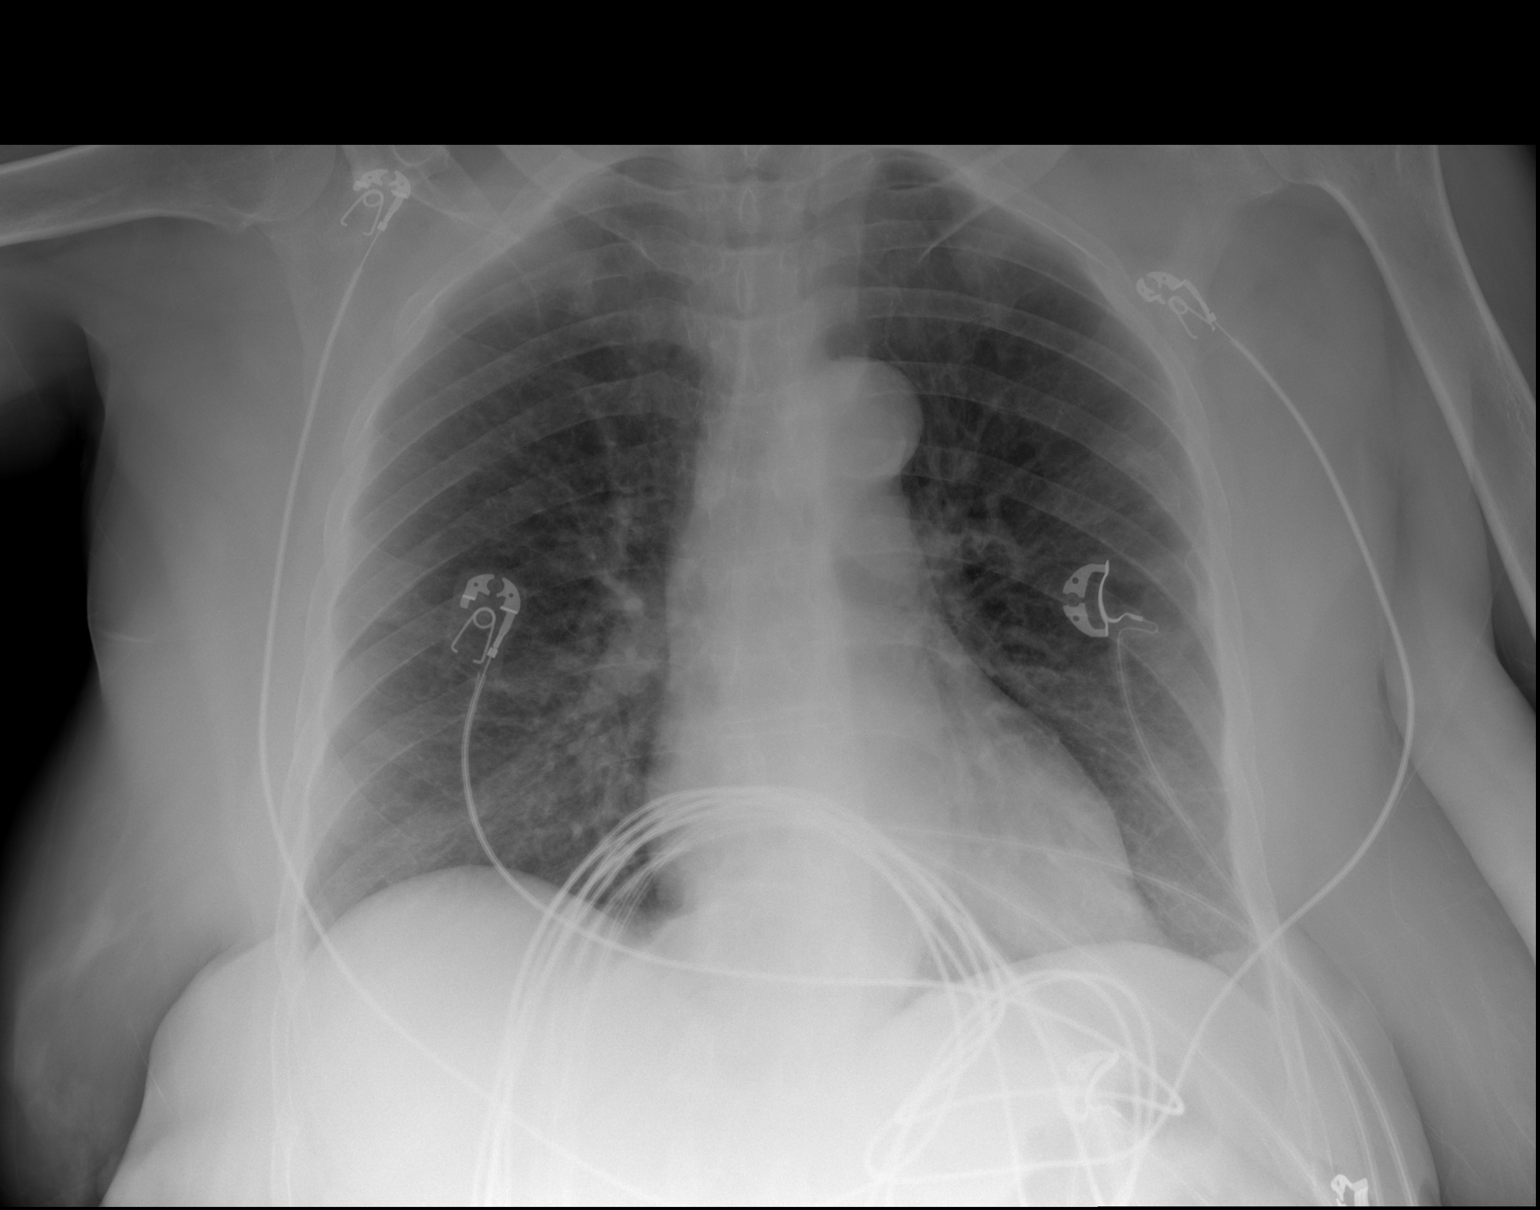

[2 of 2 positions shown; findings below may reference images not displayed]

FINDINGS: The heart size and mediastinal contours are within normal limits.
Both lungs are clear. The visualized skeletal structures are
unremarkable. Aortic atherosclerosis noted with mild tortuosity.
Monitor leads overlie the chest. No interval change.
IMPRESSION: No active cardiopulmonary disease.

## 2016-08-26 IMAGING — CT CT HEAD W/O CM
2 series · 16 of 30 positions shown, 20 images · non-contrast
Comparison: 10/28/2014

CLINICAL DATA: Headache, LEFT arm numbness for 6 months, at chest
pain after cocaine, lower abdominal pain, history hypertension and
rheumatoid arthritis

EXAM:
CT HEAD WITHOUT CONTRAST
TECHNIQUE: Contiguous axial images were obtained from the base of the skull
through the vertex without intravenous contrast.

[Series 2: bone windows · axial · 0.42mm/px · z∈[-45,-5]mm · 3 of 29 slices shown]
[im 3/29  bone]
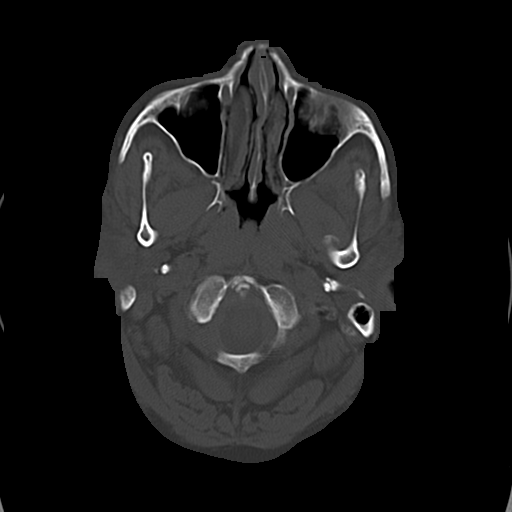
[im 7/29  bone]
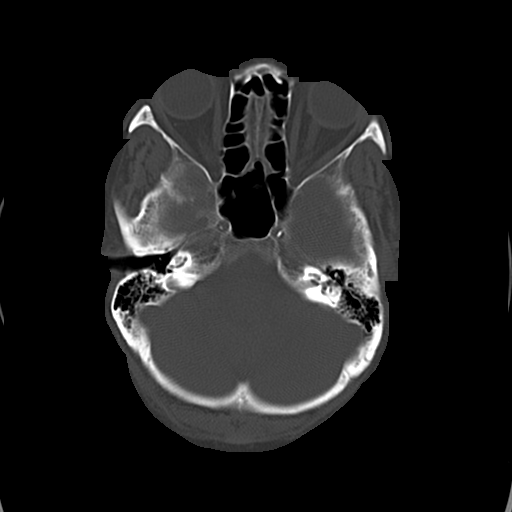
[im 11/29  bone]
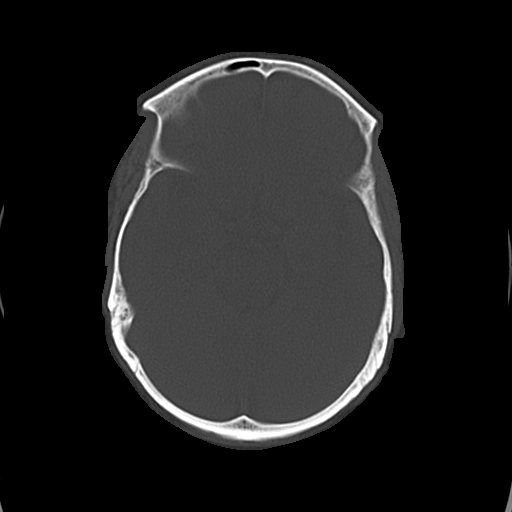

[Series 3: head w/o · axial · non-contrast · 0.42mm/px · z∈[-45,+75]mm · 13 of 29 slices shown, 17 images]
[im 3/29  brain]
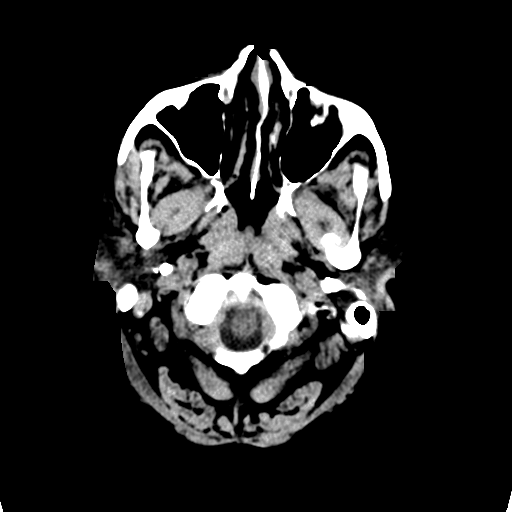
[im 3/29  bone]
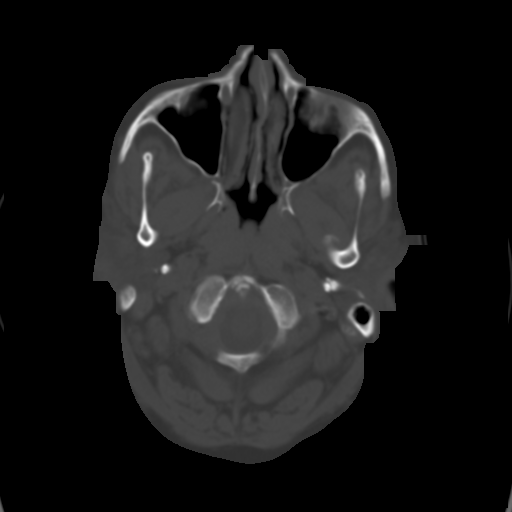
[im 5/29  brain]
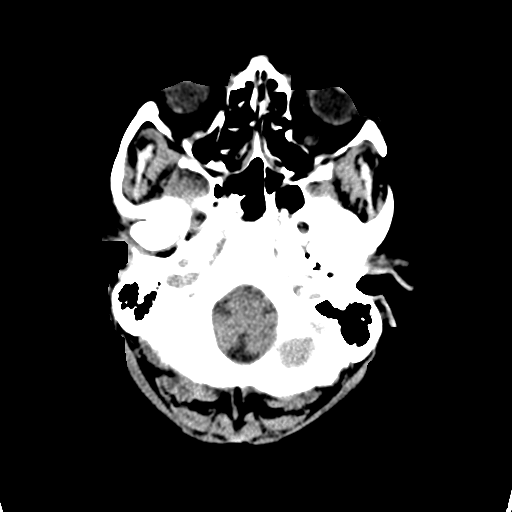
[im 7/29  brain]
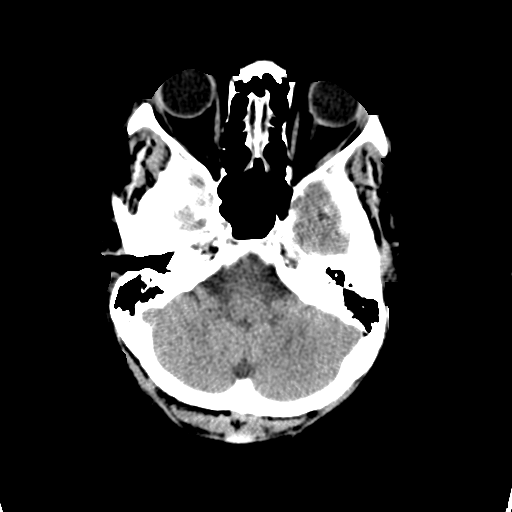
[im 9/29  brain]
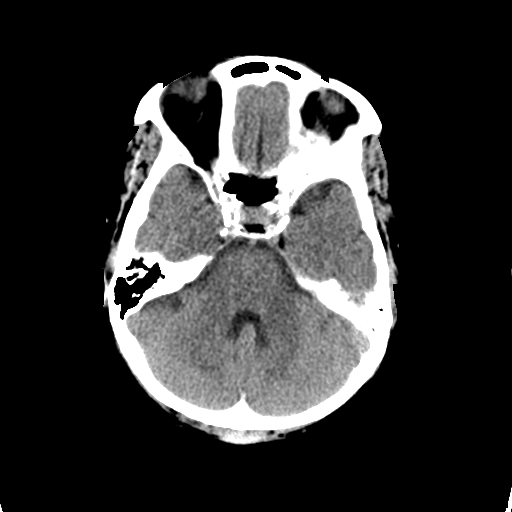
[im 11/29  brain]
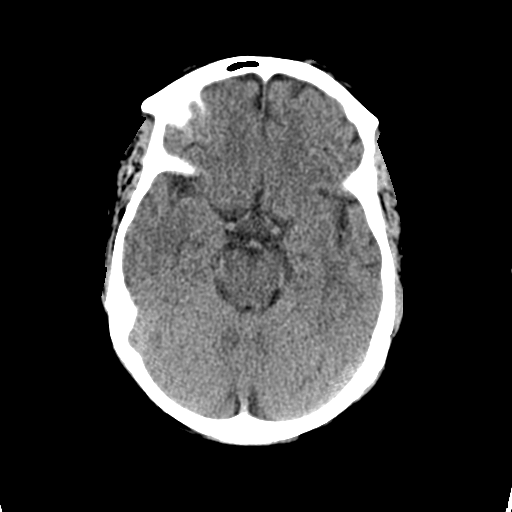
[im 11/29  bone]
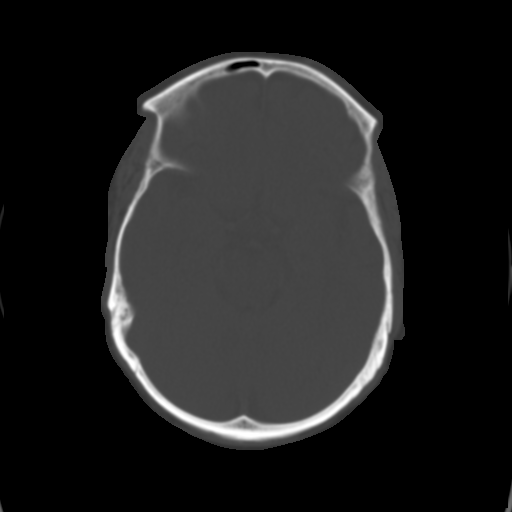
[im 13/29  brain]
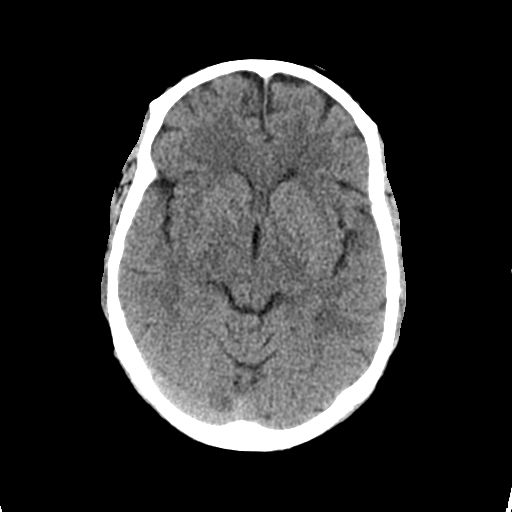
[im 15/29  brain]
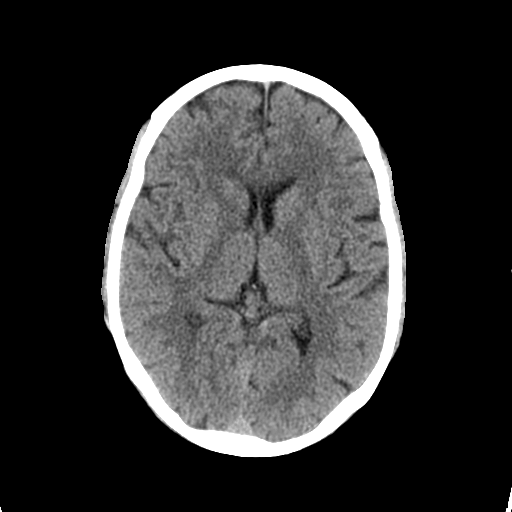
[im 17/29  brain]
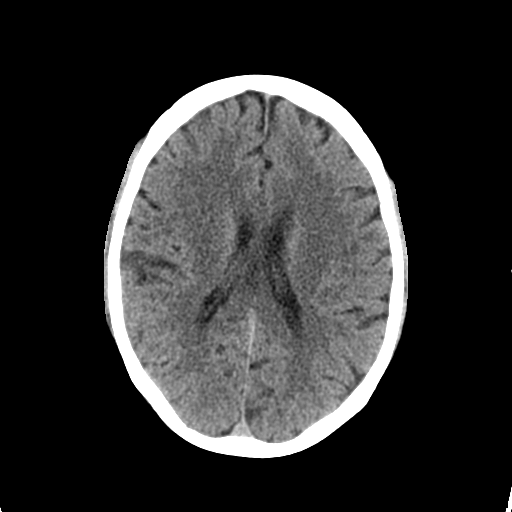
[im 19/29  brain]
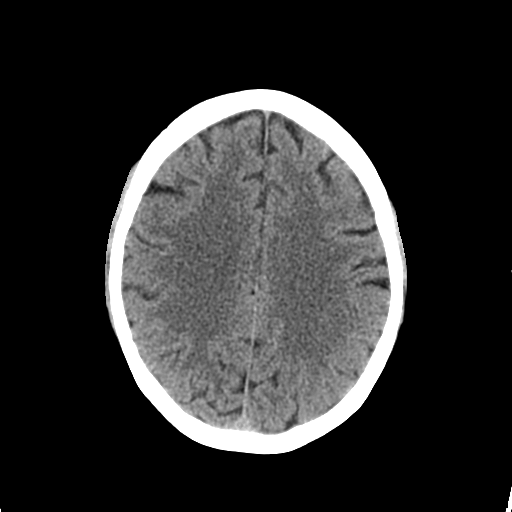
[im 19/29  bone]
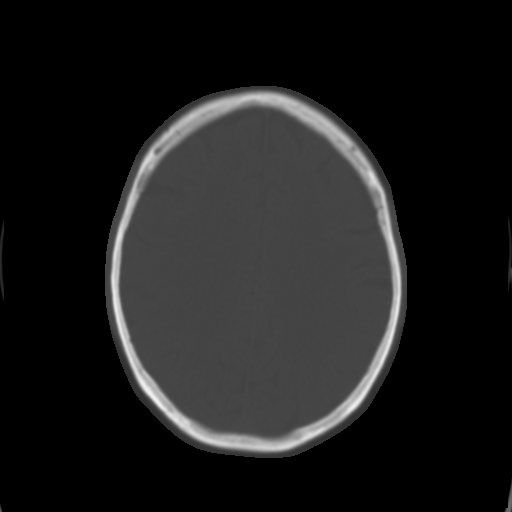
[im 21/29  brain]
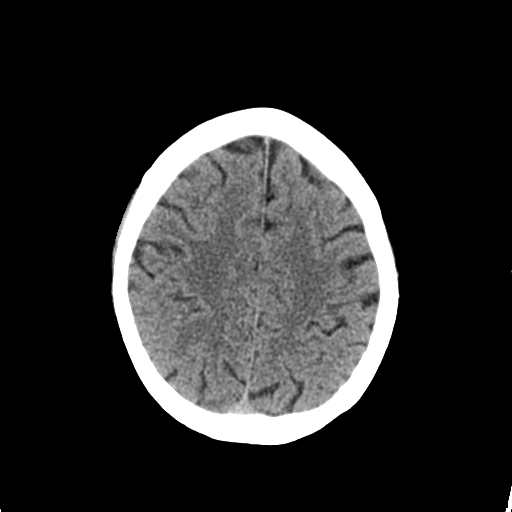
[im 23/29  brain]
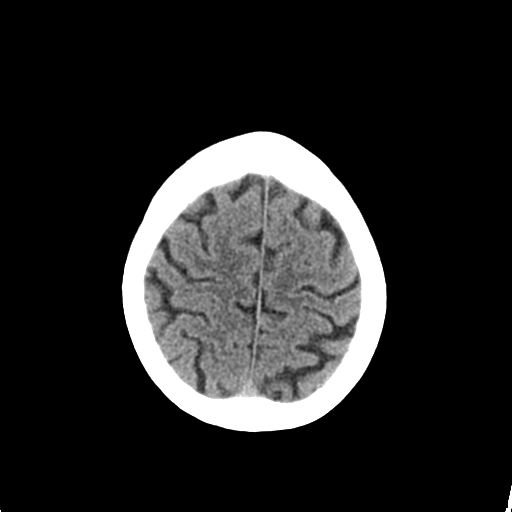
[im 25/29  brain]
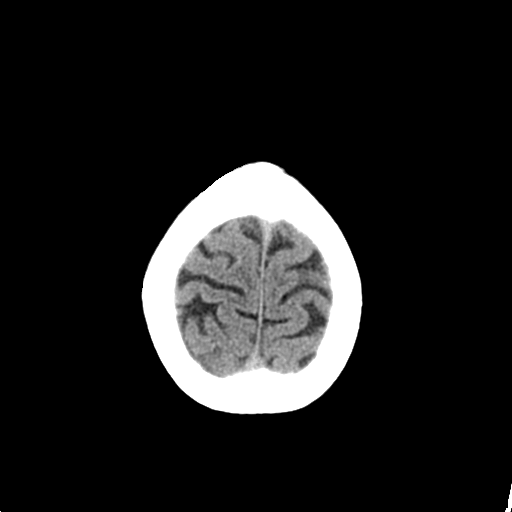
[im 27/29  brain]
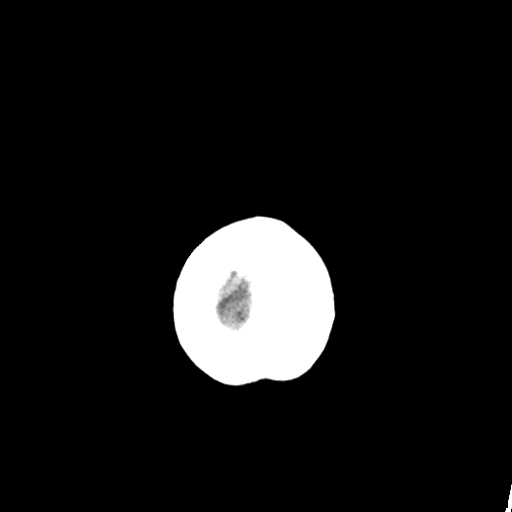
[im 27/29  bone]
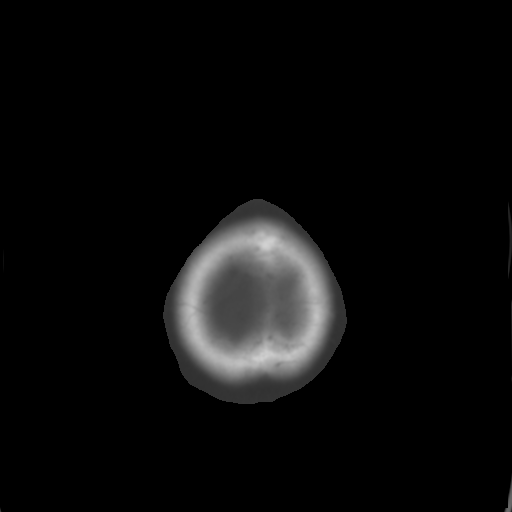

[16 of 30 positions shown; findings below may reference images not displayed]

FINDINGS: Normal ventricular morphology.

No midline shift or mass effect.

Normal appearance of brain parenchyma.

No intracranial hemorrhage, mass lesion or evidence of acute
infarction.

No extra-axial fluid collections.

Atherosclerotic calcifications at the carotid siphons.

Bones and sinuses unremarkable.
IMPRESSION: No acute intracranial abnormalities.

## 2016-08-26 IMAGING — CT CT ABD-PELV W/O CM
2 of 8 series · 14 of 46 positions shown, 18 images · non-contrast
Comparison: Noncontrast CT on 11/18/2013

CLINICAL DATA: Generalized abdominal pain and vomiting since
yesterday.

EXAM:
CT ABDOMEN AND PELVIS WITHOUT CONTRAST
TECHNIQUE: Multidetector CT imaging of the abdomen and pelvis was performed
following the standard protocol without IV contrast.

[Series 2: abd/pel w/o · axial · non-contrast · 0.74mm/px · z∈[-464,-74]mm · 11 of 90 slices shown, 15 images]
[im 6/90  soft-tissue]
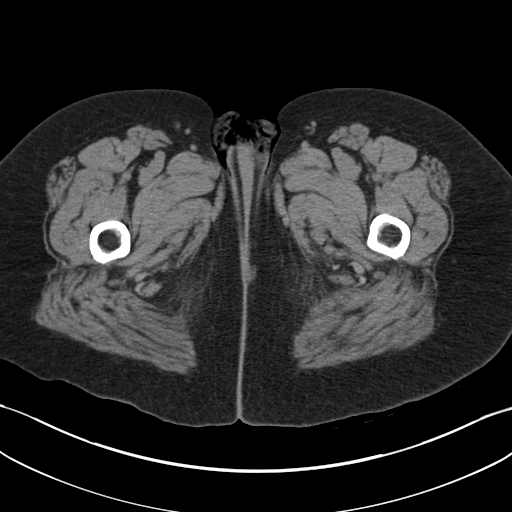
[im 6/90  bone]
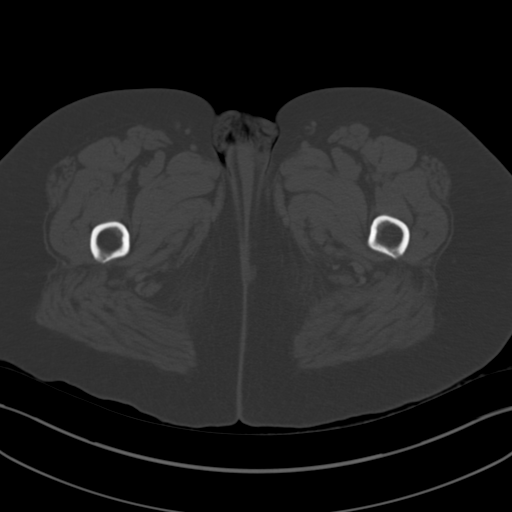
[im 17/90  soft-tissue]
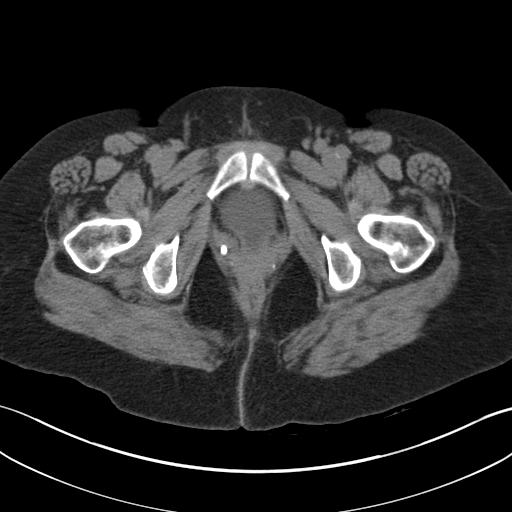
[im 28/90  soft-tissue]
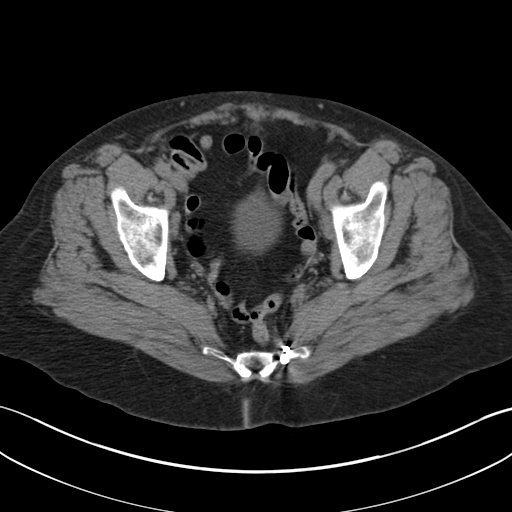
[im 34/90  soft-tissue]
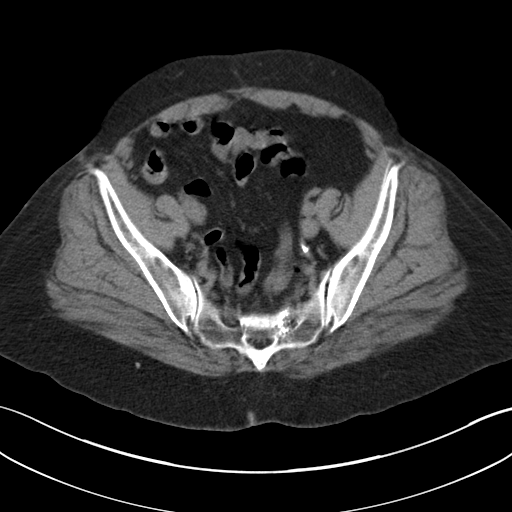
[im 45/90  soft-tissue]
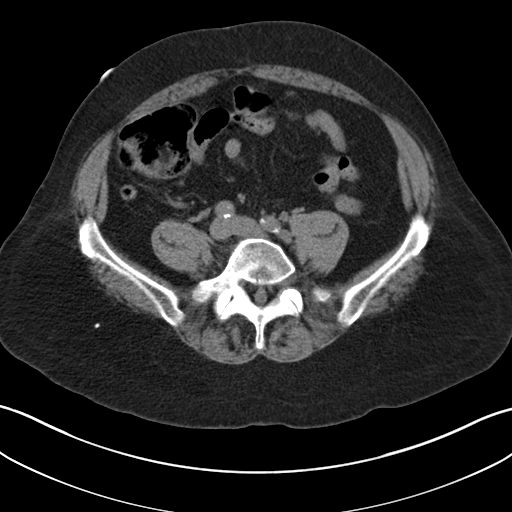
[im 56/90  soft-tissue]
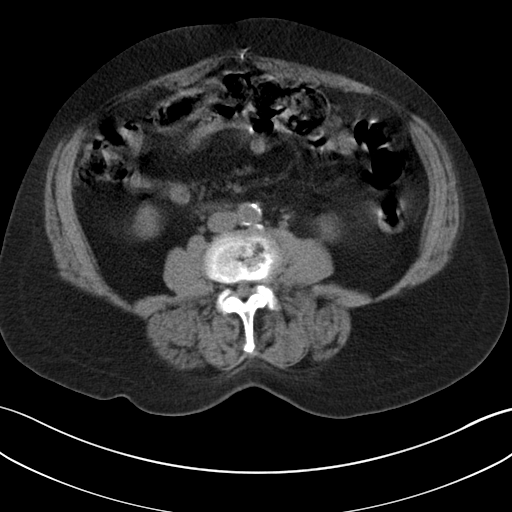
[im 62/90  soft-tissue]
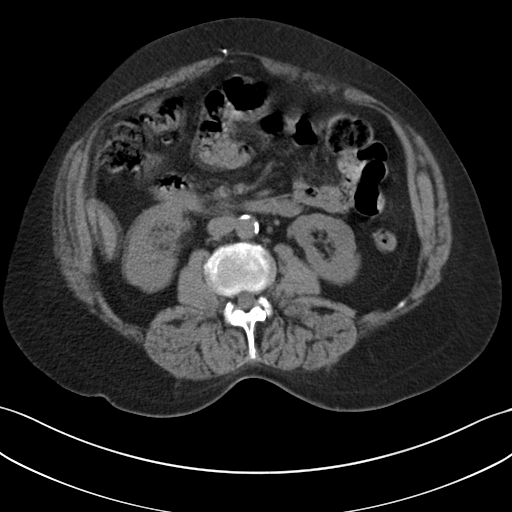
[im 67/90  lung]
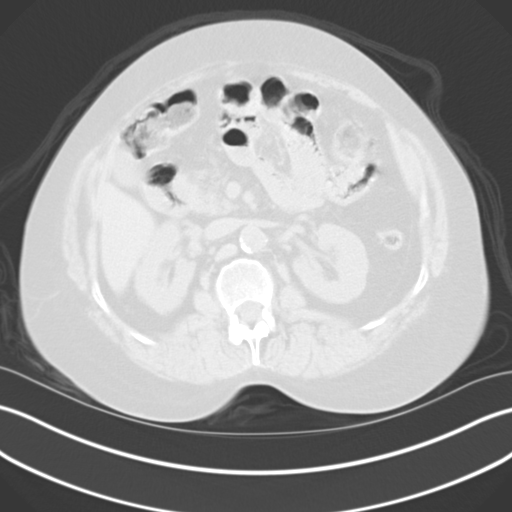
[im 73/90  soft-tissue]
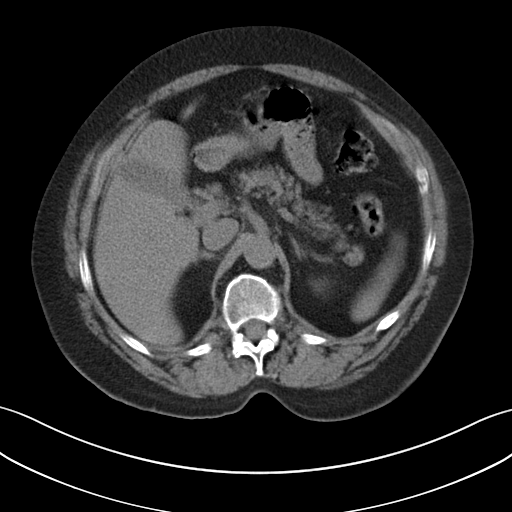
[im 73/90  lung]
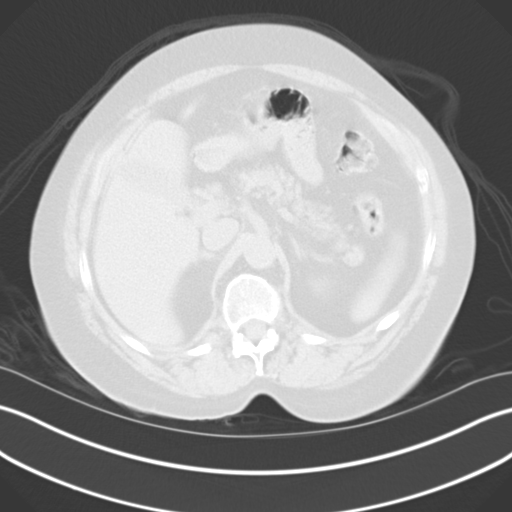
[im 78/90  lung]
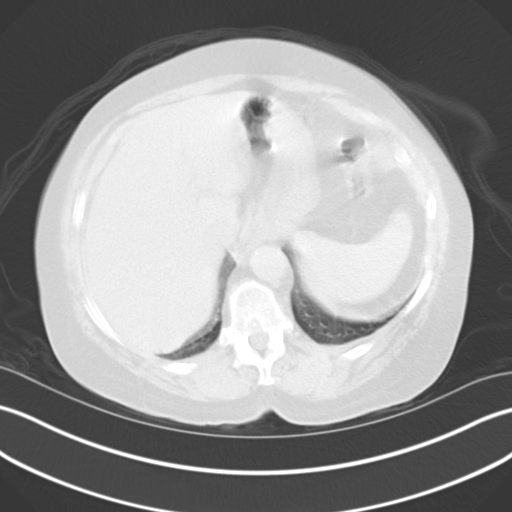
[im 84/90  soft-tissue]
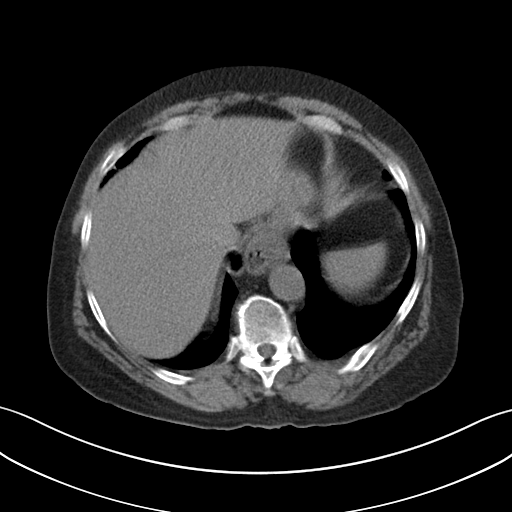
[im 84/90  lung]
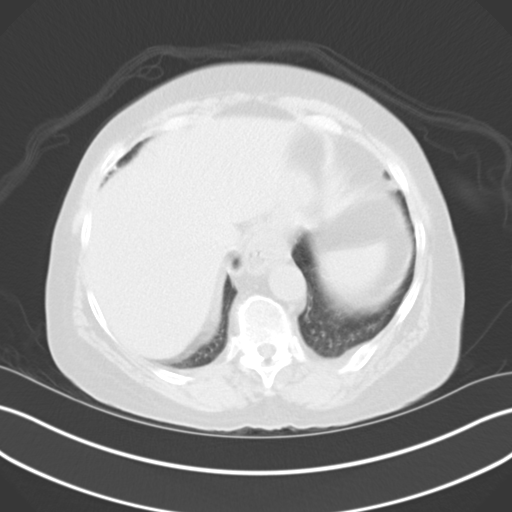
[im 84/90  bone]
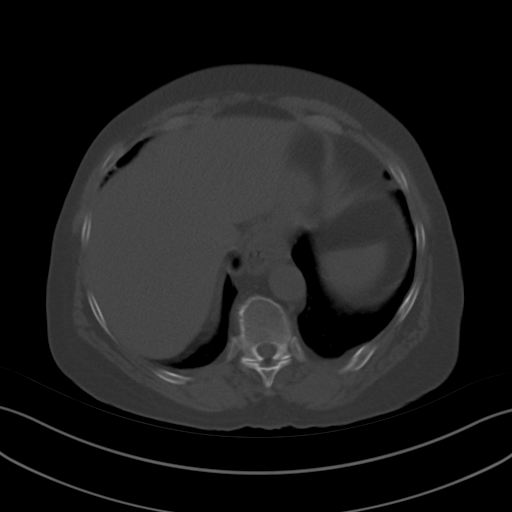

[Series 8: coronal · coronal · 0.74mm/px · 3 of 106 slices shown]
[im 27/106  soft-tissue]
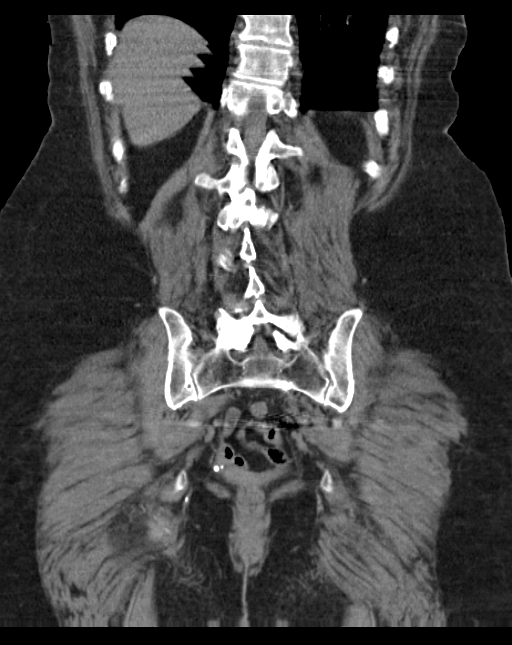
[im 53/106  soft-tissue]
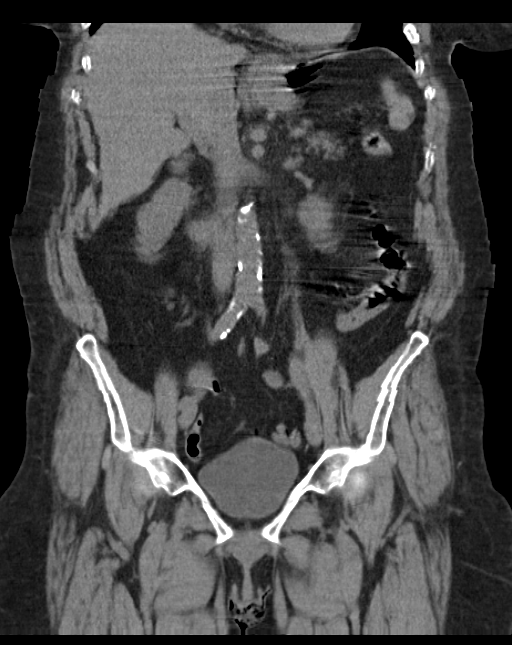
[im 79/106  soft-tissue]
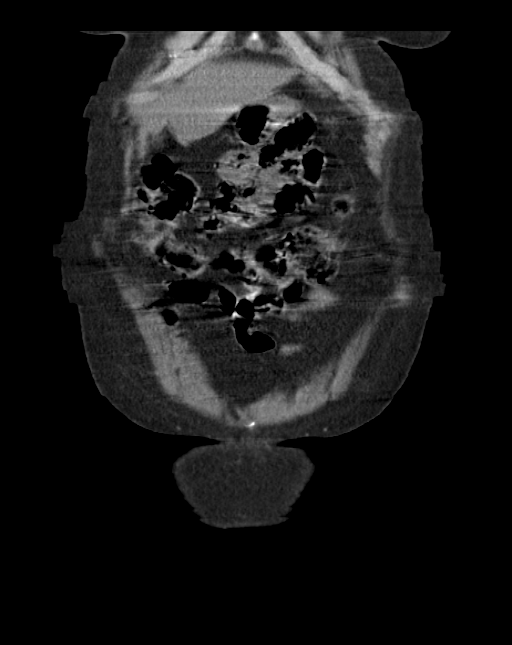

[14 of 46 positions shown; findings below may reference images not displayed]

FINDINGS: Lower chest:  Small hiatal hernia again noted.

Hepatobiliary: No mass visualized on this un-enhanced exam.
Gallbladder is unremarkable.

Pancreas: No mass or inflammatory process identified on this
un-enhanced exam.

Spleen: Within normal limits in size.

Adrenals/Urinary Tract: No evidence of urolithiasis or
hydronephrosis. No definite mass visualized on this un-enhanced
exam.

Stomach/Bowel: No evidence of obstruction, inflammatory process, or
abnormal fluid collections. Normal appendix visualized. Some motion
artifact noted.

Vascular/Lymphatic: No pathologically enlarged lymph nodes. No
evidence of abdominal aortic aneurysm.

Reproductive: Prior hysterectomy noted. Adnexal regions are
unremarkable in appearance.

Other: None.

Musculoskeletal:  No suspicious bone lesions identified.
IMPRESSION: No acute findings identified within the abdomen or pelvis.

Stable small hiatal hernia.

## 2016-08-28 ENCOUNTER — Encounter (HOSPITAL_COMMUNITY): Payer: Self-pay | Admitting: Internal Medicine

## 2016-08-28 ENCOUNTER — Inpatient Hospital Stay (HOSPITAL_COMMUNITY)
Admission: EM | Admit: 2016-08-28 | Discharge: 2016-08-30 | DRG: 871 | Disposition: A | Discharge status: Home / Self Care | Payer: Medicaid Other | Attending: Internal Medicine | Admitting: Internal Medicine

## 2016-08-28 ENCOUNTER — Emergency Department (HOSPITAL_COMMUNITY): Payer: Medicaid Other

## 2016-08-28 DIAGNOSIS — R197 Diarrhea, unspecified: Secondary | ICD-10-CM | POA: Diagnosis not present

## 2016-08-28 DIAGNOSIS — Z90710 Acquired absence of both cervix and uterus: Secondary | ICD-10-CM | POA: Diagnosis not present

## 2016-08-28 DIAGNOSIS — N3000 Acute cystitis without hematuria: Secondary | ICD-10-CM | POA: Diagnosis not present

## 2016-08-28 DIAGNOSIS — M069 Rheumatoid arthritis, unspecified: Secondary | ICD-10-CM | POA: Diagnosis present

## 2016-08-28 DIAGNOSIS — Z8249 Family history of ischemic heart disease and other diseases of the circulatory system: Secondary | ICD-10-CM

## 2016-08-28 DIAGNOSIS — Z87442 Personal history of urinary calculi: Secondary | ICD-10-CM

## 2016-08-28 DIAGNOSIS — N183 Chronic kidney disease, stage 3 unspecified: Secondary | ICD-10-CM | POA: Diagnosis present

## 2016-08-28 DIAGNOSIS — Z881 Allergy status to other antibiotic agents status: Secondary | ICD-10-CM

## 2016-08-28 DIAGNOSIS — I959 Hypotension, unspecified: Secondary | ICD-10-CM

## 2016-08-28 DIAGNOSIS — Z87892 Personal history of anaphylaxis: Secondary | ICD-10-CM

## 2016-08-28 DIAGNOSIS — E869 Volume depletion, unspecified: Secondary | ICD-10-CM | POA: Diagnosis present

## 2016-08-28 DIAGNOSIS — A419 Sepsis, unspecified organism: Secondary | ICD-10-CM | POA: Diagnosis not present

## 2016-08-28 DIAGNOSIS — Z825 Family history of asthma and other chronic lower respiratory diseases: Secondary | ICD-10-CM

## 2016-08-28 DIAGNOSIS — J449 Chronic obstructive pulmonary disease, unspecified: Secondary | ICD-10-CM | POA: Diagnosis present

## 2016-08-28 DIAGNOSIS — F17200 Nicotine dependence, unspecified, uncomplicated: Secondary | ICD-10-CM | POA: Diagnosis present

## 2016-08-28 DIAGNOSIS — K529 Noninfective gastroenteritis and colitis, unspecified: Secondary | ICD-10-CM | POA: Diagnosis present

## 2016-08-28 DIAGNOSIS — Z86711 Personal history of pulmonary embolism: Secondary | ICD-10-CM

## 2016-08-28 DIAGNOSIS — G9340 Encephalopathy, unspecified: Secondary | ICD-10-CM | POA: Diagnosis present

## 2016-08-28 DIAGNOSIS — Z885 Allergy status to narcotic agent status: Secondary | ICD-10-CM

## 2016-08-28 DIAGNOSIS — R739 Hyperglycemia, unspecified: Secondary | ICD-10-CM | POA: Diagnosis present

## 2016-08-28 DIAGNOSIS — K921 Melena: Secondary | ICD-10-CM | POA: Diagnosis present

## 2016-08-28 DIAGNOSIS — I951 Orthostatic hypotension: Secondary | ICD-10-CM | POA: Diagnosis present

## 2016-08-28 DIAGNOSIS — K920 Hematemesis: Secondary | ICD-10-CM | POA: Diagnosis present

## 2016-08-28 DIAGNOSIS — Z7952 Long term (current) use of systemic steroids: Secondary | ICD-10-CM | POA: Diagnosis not present

## 2016-08-28 DIAGNOSIS — G8929 Other chronic pain: Secondary | ICD-10-CM | POA: Diagnosis present

## 2016-08-28 DIAGNOSIS — F1410 Cocaine abuse, uncomplicated: Secondary | ICD-10-CM | POA: Diagnosis present

## 2016-08-28 DIAGNOSIS — Z91041 Radiographic dye allergy status: Secondary | ICD-10-CM

## 2016-08-28 DIAGNOSIS — Z880 Allergy status to penicillin: Secondary | ICD-10-CM | POA: Diagnosis not present

## 2016-08-28 DIAGNOSIS — Z7901 Long term (current) use of anticoagulants: Secondary | ICD-10-CM | POA: Diagnosis not present

## 2016-08-28 DIAGNOSIS — N170 Acute kidney failure with tubular necrosis: Secondary | ICD-10-CM | POA: Diagnosis present

## 2016-08-28 DIAGNOSIS — G9341 Metabolic encephalopathy: Secondary | ICD-10-CM | POA: Diagnosis present

## 2016-08-28 DIAGNOSIS — N179 Acute kidney failure, unspecified: Secondary | ICD-10-CM | POA: Diagnosis not present

## 2016-08-28 DIAGNOSIS — I129 Hypertensive chronic kidney disease with stage 1 through stage 4 chronic kidney disease, or unspecified chronic kidney disease: Secondary | ICD-10-CM | POA: Diagnosis present

## 2016-08-28 DIAGNOSIS — K219 Gastro-esophageal reflux disease without esophagitis: Secondary | ICD-10-CM | POA: Diagnosis present

## 2016-08-28 DIAGNOSIS — R6520 Severe sepsis without septic shock: Secondary | ICD-10-CM | POA: Diagnosis present

## 2016-08-28 DIAGNOSIS — N390 Urinary tract infection, site not specified: Secondary | ICD-10-CM | POA: Diagnosis present

## 2016-08-28 DIAGNOSIS — Z888 Allergy status to other drugs, medicaments and biological substances status: Secondary | ICD-10-CM

## 2016-08-28 LAB — CBC WITH DIFFERENTIAL/PLATELET
BASOS PCT: 0 %
Basophils Absolute: 0 10*3/uL (ref 0.0–0.1)
Eosinophils Absolute: 0 10*3/uL (ref 0.0–0.7)
Eosinophils Relative: 0 %
HEMATOCRIT: 38.7 % (ref 36.0–46.0)
HEMOGLOBIN: 13.8 g/dL (ref 12.0–15.0)
LYMPHS ABS: 2 10*3/uL (ref 0.7–4.0)
Lymphocytes Relative: 8 %
MCH: 31 pg (ref 26.0–34.0)
MCHC: 35.7 g/dL (ref 30.0–36.0)
MCV: 87 fL (ref 78.0–100.0)
MONOS PCT: 6 %
Monocytes Absolute: 1.5 10*3/uL — ABNORMAL HIGH (ref 0.1–1.0)
NEUTROS ABS: 21.7 10*3/uL — AB (ref 1.7–7.7)
Neutrophils Relative %: 86 %
Platelets: 328 10*3/uL (ref 150–400)
RBC: 4.45 MIL/uL (ref 3.87–5.11)
RDW: 14.9 % (ref 11.5–15.5)
WBC: 25.2 10*3/uL — ABNORMAL HIGH (ref 4.0–10.5)

## 2016-08-28 LAB — COMPREHENSIVE METABOLIC PANEL
ALBUMIN: 3 g/dL — AB (ref 3.5–5.0)
ALT: 11 U/L — AB (ref 14–54)
AST: 9 U/L — AB (ref 15–41)
Alkaline Phosphatase: 89 U/L (ref 38–126)
Anion gap: 14 (ref 5–15)
BUN: 96 mg/dL — AB (ref 6–20)
CHLORIDE: 111 mmol/L (ref 101–111)
CO2: 10 mmol/L — AB (ref 22–32)
CREATININE: 7.56 mg/dL — AB (ref 0.44–1.00)
Calcium: 8.9 mg/dL (ref 8.9–10.3)
GFR calc Af Amer: 6 mL/min — ABNORMAL LOW (ref >60)
GFR calc non Af Amer: 5 mL/min — ABNORMAL LOW (ref >60)
GLUCOSE: 124 mg/dL — AB (ref 65–99)
POTASSIUM: 4.7 mmol/L (ref 3.5–5.1)
SODIUM: 135 mmol/L (ref 135–145)
Total Bilirubin: 0.3 mg/dL (ref 0.3–1.2)
Total Protein: 6.8 g/dL (ref 6.5–8.1)

## 2016-08-28 LAB — URINALYSIS, ROUTINE W REFLEX MICROSCOPIC
GLUCOSE, UA: NEGATIVE mg/dL
KETONES UR: NEGATIVE mg/dL
Nitrite: NEGATIVE
PROTEIN: 30 mg/dL — AB
Specific Gravity, Urine: 1.021 (ref 1.005–1.030)
pH: 5 (ref 5.0–8.0)

## 2016-08-28 LAB — RAPID URINE DRUG SCREEN, HOSP PERFORMED
Amphetamines: NOT DETECTED
Barbiturates: NOT DETECTED
Benzodiazepines: NOT DETECTED
Cocaine: POSITIVE — AB
Opiates: NOT DETECTED
TETRAHYDROCANNABINOL: NOT DETECTED

## 2016-08-28 LAB — I-STAT CG4 LACTIC ACID, ED: Lactic Acid, Venous: 2.24 mmol/L (ref 0.5–1.9)

## 2016-08-28 LAB — LIPASE, BLOOD: LIPASE: 28 U/L (ref 11–51)

## 2016-08-28 MED ORDER — SODIUM CHLORIDE 0.9 % IV BOLUS (SEPSIS)
2000.0000 mL | Freq: Once | INTRAVENOUS | Status: AC
  Administered 2016-08-28: 2000 mL via INTRAVENOUS

## 2016-08-28 MED ORDER — HYDROCORTISONE NA SUCCINATE PF 100 MG IJ SOLR
100.0000 mg | Freq: Once | INTRAMUSCULAR | Status: AC
  Administered 2016-08-29: 100 mg via INTRAVENOUS
  Filled 2016-08-28: qty 2

## 2016-08-28 MED ORDER — SODIUM CHLORIDE 0.9 % IV BOLUS (SEPSIS)
1000.0000 mL | Freq: Once | INTRAVENOUS | Status: AC
  Administered 2016-08-28: 1000 mL via INTRAVENOUS

## 2016-08-28 MED ORDER — STERILE WATER FOR INJECTION IV SOLN
150.0000 meq | INTRAVENOUS | Status: DC
  Administered 2016-08-28: 150 meq via INTRAVENOUS
  Filled 2016-08-28: qty 850

## 2016-08-28 MED ORDER — CEFTRIAXONE SODIUM 2 G IJ SOLR
2.0000 g | Freq: Once | INTRAMUSCULAR | Status: AC
  Administered 2016-08-28: 2 g via INTRAVENOUS
  Filled 2016-08-28: qty 2

## 2016-08-28 MED ORDER — DEXTROSE 5 % IV SOLN: 1.0000 g | Freq: Once | INTRAVENOUS | Status: DC

## 2016-08-28 MED ORDER — SODIUM CHLORIDE 0.9 % IV BOLUS (SEPSIS): 1000.0000 mL | Freq: Once | INTRAVENOUS | Status: DC

## 2016-08-28 MED ORDER — SODIUM CHLORIDE 0.9 % IV SOLN
INTRAVENOUS | Status: DC
  Administered 2016-08-28: 16:00:00 via INTRAVENOUS

## 2016-08-28 MED ORDER — PROMETHAZINE HCL 25 MG/ML IJ SOLN
12.5000 mg | Freq: Once | INTRAMUSCULAR | Status: AC
  Administered 2016-08-28: 12.5 mg via INTRAVENOUS
  Filled 2016-08-28: qty 1
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
## 2016-08-28 NOTE — ED Triage Notes (Signed)
Patient c/o N/V/D and rectal bleeding with bright red clots since yesterday. Patient c/o seeing blood in her emesis.  Patient also c/o abdominal bloating and pain.
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
## 2016-08-28 NOTE — ED Notes (Signed)
Attempted lab draw x 2 but unsuccessful. 

## 2016-08-28 NOTE — ED Notes (Signed)
Called house phlebotomy for assistance with lab draw. Please excuse and anticipate the delay.

## 2016-08-28 NOTE — ED Notes (Signed)
Unable to obtain the second set of culture without delaying care/abx initiation, EDP notified ordered abx started.
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
## 2016-08-28 NOTE — Progress Notes (Signed)
Pt alert to self, altered mental status, the pt is lethargic and unable to answer her admission questions. Her skin is  dry and intact, her tongue appears discolored, with a black thick tar appearance. I will continue to monitor.

## 2016-08-29 DIAGNOSIS — G9340 Encephalopathy, unspecified: Secondary | ICD-10-CM

## 2016-08-29 LAB — BLOOD GAS, ARTERIAL
Acid-base deficit: 13.6 mmol/L — ABNORMAL HIGH (ref 0.0–2.0)
BICARBONATE: 11.1 mmol/L — AB (ref 20.0–28.0)
Drawn by: 308601
FIO2: 21
O2 Saturation: 95.4 %
PH ART: 7.304 — AB (ref 7.350–7.450)
PO2 ART: 90.1 mmHg (ref 83.0–108.0)
Patient temperature: 98.6
pCO2 arterial: 22.9 mmHg — ABNORMAL LOW (ref 32.0–48.0)

## 2016-08-29 LAB — URINALYSIS, COMPLETE (UACMP) WITH MICROSCOPIC
BILIRUBIN URINE: NEGATIVE
Glucose, UA: NEGATIVE mg/dL
KETONES UR: NEGATIVE mg/dL
LEUKOCYTES UA: NEGATIVE
Nitrite: NEGATIVE
PH: 5 (ref 5.0–8.0)
Protein, ur: >=300 mg/dL — AB
Specific Gravity, Urine: 1.013 (ref 1.005–1.030)
WBC UA: NONE SEEN WBC/hpf (ref 0–5)

## 2016-08-29 LAB — COMPREHENSIVE METABOLIC PANEL
ALBUMIN: 2.7 g/dL — AB (ref 3.5–5.0)
ALK PHOS: 77 U/L (ref 38–126)
ALT: 12 U/L — AB (ref 14–54)
AST: 11 U/L — ABNORMAL LOW (ref 15–41)
Anion gap: 10 (ref 5–15)
BUN: 88 mg/dL — AB (ref 6–20)
CALCIUM: 8.7 mg/dL — AB (ref 8.9–10.3)
CO2: 15 mmol/L — AB (ref 22–32)
CREATININE: 5.04 mg/dL — AB (ref 0.44–1.00)
Chloride: 111 mmol/L (ref 101–111)
GFR calc Af Amer: 10 mL/min — ABNORMAL LOW (ref >60)
GFR calc non Af Amer: 8 mL/min — ABNORMAL LOW (ref >60)
GLUCOSE: 115 mg/dL — AB (ref 65–99)
Potassium: 4.5 mmol/L (ref 3.5–5.1)
SODIUM: 136 mmol/L (ref 135–145)
Total Bilirubin: 0.5 mg/dL (ref 0.3–1.2)
Total Protein: 6.2 g/dL — ABNORMAL LOW (ref 6.5–8.1)

## 2016-08-29 LAB — RENAL FUNCTION PANEL
ALBUMIN: 2.7 g/dL — AB (ref 3.5–5.0)
ANION GAP: 9 (ref 5–15)
BUN: 86 mg/dL — ABNORMAL HIGH (ref 6–20)
CHLORIDE: 111 mmol/L (ref 101–111)
CO2: 15 mmol/L — ABNORMAL LOW (ref 22–32)
Calcium: 8.6 mg/dL — ABNORMAL LOW (ref 8.9–10.3)
Creatinine, Ser: 4.99 mg/dL — ABNORMAL HIGH (ref 0.44–1.00)
GFR calc Af Amer: 10 mL/min — ABNORMAL LOW (ref >60)
GFR calc non Af Amer: 8 mL/min — ABNORMAL LOW (ref >60)
GLUCOSE: 114 mg/dL — AB (ref 65–99)
PHOSPHORUS: 4.2 mg/dL (ref 2.5–4.6)
POTASSIUM: 4.4 mmol/L (ref 3.5–5.1)
Sodium: 135 mmol/L (ref 135–145)

## 2016-08-29 LAB — APTT
aPTT: 24 seconds (ref 24–36)
aPTT: 60 seconds — ABNORMAL HIGH (ref 24–36)

## 2016-08-29 LAB — LACTIC ACID, PLASMA
LACTIC ACID, VENOUS: 1.1 mmol/L (ref 0.5–1.9)
Lactic Acid, Venous: 1.2 mmol/L (ref 0.5–1.9)

## 2016-08-29 LAB — CBC WITH DIFFERENTIAL/PLATELET
BASOS ABS: 0 10*3/uL (ref 0.0–0.1)
Basophils Relative: 0 %
EOS PCT: 0 %
Eosinophils Absolute: 0 10*3/uL (ref 0.0–0.7)
HEMATOCRIT: 35.1 % — AB (ref 36.0–46.0)
Hemoglobin: 12.2 g/dL (ref 12.0–15.0)
LYMPHS ABS: 1 10*3/uL (ref 0.7–4.0)
LYMPHS PCT: 6 %
MCH: 30.3 pg (ref 26.0–34.0)
MCHC: 34.8 g/dL (ref 30.0–36.0)
MCV: 87.3 fL (ref 78.0–100.0)
MONO ABS: 0.6 10*3/uL (ref 0.1–1.0)
Monocytes Relative: 3 %
Neutro Abs: 16.1 10*3/uL — ABNORMAL HIGH (ref 1.7–7.7)
Neutrophils Relative %: 91 %
PLATELETS: 276 10*3/uL (ref 150–400)
RBC: 4.02 MIL/uL (ref 3.87–5.11)
RDW: 14.9 % (ref 11.5–15.5)
WBC: 17.7 10*3/uL — ABNORMAL HIGH (ref 4.0–10.5)

## 2016-08-29 LAB — CREATININE, URINE, RANDOM: Creatinine, Urine: 138.3 mg/dL

## 2016-08-29 LAB — SODIUM, URINE, RANDOM: Sodium, Ur: 64 mmol/L

## 2016-08-29 LAB — MRSA PCR SCREENING: MRSA by PCR: NEGATIVE

## 2016-08-29 LAB — PROCALCITONIN: PROCALCITONIN: 1.07 ng/mL

## 2016-08-29 LAB — PROTIME-INR
INR: 1.22
Prothrombin Time: 15.4 seconds — ABNORMAL HIGH (ref 11.4–15.2)

## 2016-08-29 LAB — HEPARIN LEVEL (UNFRACTIONATED): Heparin Unfractionated: 1.06 IU/mL — ABNORMAL HIGH (ref 0.30–0.70)
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
## 2016-08-29 MED ORDER — SODIUM BICARBONATE 8.4 % IV SOLN
INTRAVENOUS | Status: DC
  Administered 2016-08-29: 09:00:00 via INTRAVENOUS
  Filled 2016-08-29 (×3): qty 150

## 2016-08-29 MED ORDER — OXYCODONE HCL 5 MG PO TABS
5.0000 mg | ORAL_TABLET | ORAL | Status: DC | PRN
  Administered 2016-08-29 – 2016-08-30 (×4): 5 mg via ORAL
  Filled 2016-08-29 (×6): qty 1

## 2016-08-29 MED ORDER — SODIUM CHLORIDE 0.9 % IV SOLN: INTRAVENOUS | Status: DC

## 2016-08-29 MED ORDER — FLUTICASONE PROPIONATE 50 MCG/ACT NA SUSP
2.0000 | Freq: Every day | NASAL | Status: DC
  Administered 2016-08-29 – 2016-08-30 (×2): 2 via NASAL
  Filled 2016-08-29: qty 16

## 2016-08-29 MED ORDER — SALINE SPRAY 0.65 % NA SOLN
1.0000 | NASAL | Status: DC | PRN
  Administered 2016-08-29: 1 via NASAL
  Filled 2016-08-29: qty 44

## 2016-08-29 MED ORDER — CHLORHEXIDINE GLUCONATE 0.12 % MT SOLN
15.0000 mL | Freq: Two times a day (BID) | OROMUCOSAL | Status: DC
  Administered 2016-08-29 – 2016-08-30 (×2): 15 mL via OROMUCOSAL
  Filled 2016-08-29 (×2): qty 15

## 2016-08-29 MED ORDER — DEXTROSE 5 % IV SOLN
1.0000 g | Freq: Once | INTRAVENOUS | Status: AC
  Administered 2016-08-29: 1 g via INTRAVENOUS
  Filled 2016-08-29: qty 1

## 2016-08-29 MED ORDER — PANTOPRAZOLE SODIUM 40 MG IV SOLR
40.0000 mg | Freq: Two times a day (BID) | INTRAVENOUS | Status: DC
  Administered 2016-08-29 – 2016-08-30 (×4): 40 mg via INTRAVENOUS
  Filled 2016-08-29 (×4): qty 40

## 2016-08-29 MED ORDER — SODIUM CHLORIDE 0.9 % IV BOLUS (SEPSIS)
500.0000 mL | INTRAVENOUS | Status: DC | PRN
  Administered 2016-08-29 (×2): 500 mL via INTRAVENOUS

## 2016-08-29 MED ORDER — VANCOMYCIN HCL IN DEXTROSE 1-5 GM/200ML-% IV SOLN
1000.0000 mg | Freq: Once | INTRAVENOUS | Status: AC
  Administered 2016-08-29: 1000 mg via INTRAVENOUS
  Filled 2016-08-29: qty 200

## 2016-08-29 MED ORDER — ALBUTEROL SULFATE (2.5 MG/3ML) 0.083% IN NEBU: 2.5000 mg | INHALATION_SOLUTION | Freq: Four times a day (QID) | RESPIRATORY_TRACT | Status: DC | PRN

## 2016-08-29 MED ORDER — HEPARIN (PORCINE) IN NACL 100-0.45 UNIT/ML-% IJ SOLN
1100.0000 [IU]/h | INTRAMUSCULAR | Status: AC
  Administered 2016-08-29: 1000 [IU]/h via INTRAVENOUS
  Filled 2016-08-29: qty 250

## 2016-08-29 MED ORDER — ACETAMINOPHEN 325 MG PO TABS: 650.0000 mg | ORAL_TABLET | Freq: Four times a day (QID) | ORAL | Status: DC | PRN

## 2016-08-29 MED ORDER — HYDROCORTISONE NA SUCCINATE PF 100 MG IJ SOLR
100.0000 mg | Freq: Three times a day (TID) | INTRAMUSCULAR | Status: DC
  Administered 2016-08-29 – 2016-08-30 (×4): 100 mg via INTRAVENOUS
  Filled 2016-08-29 (×4): qty 2

## 2016-08-29 MED ORDER — ACETAMINOPHEN 650 MG RE SUPP
650.0000 mg | Freq: Four times a day (QID) | RECTAL | Status: DC | PRN
Start: 2016-08-29 — End: 2016-08-30

## 2016-08-29 MED ORDER — ORAL CARE MOUTH RINSE
15.0000 mL | Freq: Two times a day (BID) | OROMUCOSAL | Status: DC
  Administered 2016-08-29: 15 mL via OROMUCOSAL

## 2016-08-29 MED ORDER — DEXTROSE 5 % IV SOLN
500.0000 mg | INTRAVENOUS | Status: DC
  Administered 2016-08-29: 500 mg via INTRAVENOUS
  Filled 2016-08-29 (×2): qty 0.5
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
## 2016-08-29 NOTE — Progress Notes (Signed)
Late enrty  Pt's bp was 80/40 made Dr Myna Hidalgo aware  order received for 500 cc bolus, bp after the bolus was 103/45, 65, 13, 100% on r/a hr 80 I will continue to monitor.

## 2016-08-29 NOTE — Progress Notes (Signed)
Pharmacy Antibiotic Note  [name] is a 61 y.o. female admitted on 08/28/2016 with sepsis.  Pharmacy has been consulted for Vancomycin, cefepime dosing.  Plan: Vancomycin 1gm iv x1, then dose per levels, next level at 8/20 at 2200 (Goal trough 15-20 mcg/mL.)  Cefepime 1gm iv x1, then 500mg  iv q24hr (CrCl less than 11 mL/min and not receiving hemodialysis, for usual dose of 2gm iv q12hr)   Weight: 177 lb 7.5 oz (80.5 kg)  Temp (24hrs), Avg:97.8 F (36.6 C), Min:97.4 F (36.3 C), Max:98.1 F (36.7 C)   Recent Labs Lab 08/28/16 1523 08/28/16 1759 08/28/16 1827 08/29/16 0053 08/29/16 0404  WBC 25.2*  --   --   --   --   CREATININE  --  7.56*  --   --   --   LATICACIDVEN  --   --  2.24* 1.1 1.2    Estimated Creatinine Clearance: 8.3 mL/min (A) (by C-G formula based on SCr of 7.56 mg/dL (H)).    Allergies  Allergen Reactions  . Iohexol Anaphylaxis, Swelling and Other (See Comments)      Throat swelling    . Levofloxacin In D5w Other (See Comments)    Has baseline prolonged QTc.  Avoid QTc prolonging medications.  Tresa Moore Hcl] Other (See Comments)    Has baseline prolonged QTc.  Avoid QTc prolonging medications.  . Morphine And Related Itching  . 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 Penicillins Itching    Has patient had a PCN reaction causing immediate rash, facial/tongue/throat swelling, SOB or lightheadedness with hypotension: yes Has patient had a PCN reaction causing severe rash involving mucus membranes or skin necrosis: unknown Has patient had a PCN reaction that required hospitalization : no, reacted while in the Drs office Has patient had a PCN reaction occurring within the last 10 years: yes If all of the above answers are "NO", then may proceed with Cephalosporin use.     Antimicrobials this admission: Vancomycin 08/29/2016 >> Cefepime 08/29/2016 >>   Ceftriaxone 8/18 x1  Dose adjustments this admission:   Microbiology results: pending  Thank you for allowing pharmacy to be a part of this patient's care.  Nani Skillern Crowford 08/29/2016 4:53 AM
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
## 2016-08-29 NOTE — Progress Notes (Signed)
[place] Progress Note Patient Name: [name] DOB: 16-Feb-1955 MRN: [id number]   Date of Service  08/29/2016  HPI/Events of Note    eICU Interventions  Lactate ordered to complete sepsis bundle.      Intervention Category Intermediate Interventions: Other:  Aiden Helzer S. 08/29/2016, 12:18 AM
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
## 2016-08-30 LAB — URINE CULTURE

## 2016-08-30 LAB — BASIC METABOLIC PANEL
ANION GAP: 8 (ref 5–15)
BUN: 40 mg/dL — ABNORMAL HIGH (ref 6–20)
CALCIUM: 8.2 mg/dL — AB (ref 8.9–10.3)
CO2: 21 mmol/L — ABNORMAL LOW (ref 22–32)
CREATININE: 1.53 mg/dL — AB (ref 0.44–1.00)
Chloride: 112 mmol/L — ABNORMAL HIGH (ref 101–111)
GFR, EST AFRICAN AMERICAN: 41 mL/min — AB (ref >60)
GFR, EST NON AFRICAN AMERICAN: 35 mL/min — AB (ref >60)
GLUCOSE: 122 mg/dL — AB (ref 65–99)
Potassium: 4.7 mmol/L (ref 3.5–5.1)
Sodium: 141 mmol/L (ref 135–145)

## 2016-08-30 LAB — APTT
APTT: 69 s — AB (ref 24–36)
aPTT: 96 seconds — ABNORMAL HIGH (ref 24–36)

## 2016-08-30 LAB — HEPARIN LEVEL (UNFRACTIONATED): HEPARIN UNFRACTIONATED: 1.14 [IU]/mL — AB (ref 0.30–0.70)

## 2016-08-30 MED ORDER — CEFPODOXIME PROXETIL 200 MG PO TABS: 200.0000 mg | ORAL_TABLET | Freq: Two times a day (BID) | ORAL | 0 refills | Status: DC

## 2016-08-30 MED ORDER — HYDROCORTISONE NA SUCCINATE PF 100 MG IJ SOLR: 100.0000 mg | Freq: Two times a day (BID) | INTRAMUSCULAR | Status: DC

## 2016-08-30 MED ORDER — VANCOMYCIN HCL 10 G IV SOLR
1250.0000 mg | INTRAVENOUS | Status: DC
  Administered 2016-08-30: 1250 mg via INTRAVENOUS
  Filled 2016-08-30: qty 1250

## 2016-08-30 MED ORDER — APIXABAN 5 MG PO TABS: 5.0000 mg | ORAL_TABLET | Freq: Two times a day (BID) | ORAL | Status: DC

## 2016-08-30 MED ORDER — LEVOFLOXACIN 500 MG PO TABS: 500.0000 mg | ORAL_TABLET | Freq: Every day | ORAL | 0 refills | Status: DC

## 2016-08-30 MED ORDER — AMLODIPINE BESYLATE 10 MG PO TABS: 10.0000 mg | ORAL_TABLET | Freq: Every day | ORAL | 1 refills | Status: DC

## 2016-08-30 MED ORDER — DEXTROSE 5 % IV SOLN
2.0000 g | INTRAVENOUS | Status: DC
  Filled 2016-08-30: qty 2

## 2016-08-30 NOTE — Care Management Note (Signed)
Case Management Note  Patient Details  Name: [name] MRN: [id number] Date of Birth: Sep 26, 1955  Subjective/Objective:   No CM needs.                 Action/Plan:d/c home.   Expected Discharge Date:  08/30/16               Expected Discharge Plan:  Home/Self Care  In-House Referral:     Discharge planning Services  CM Consult  Post Acute Care Choice:    Choice offered to:  Patient  DME Arranged:    DME Agency:     HH Arranged:  Patient Refused [facility] Agency:     Status of Service:  Completed, signed off  If discussed at [employer] of Stay Meetings, dates discussed:    Additional Comments:  Dessa Phi, RN 08/30/2016, 4:00 PM
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
## 2016-08-30 NOTE — Progress Notes (Signed)
Pharmacy Antibiotic Note  [name] is a 61 y.o. female admitted on 08/28/2016 with sepsis.  Pharmacy has been consulted for vancomycin and cefepime dosing.  Day #2 vancomycin and zosyn (day #3 total antibiotics). SCr significantly improved today. Will adjust antibiotics accordingly.  Still no source of infection identified.  Possible UTI.  Awaiting culture results.  Plan: Vancomycin 1250 mg IV q24h. Cefepime 2g IV q24h.  Height: 5' 6.5" (168.9 cm) Weight: 185 lb 8 oz (84.1 kg) IBW/kg (Calculated) : 60.45  Temp (24hrs), Avg:98.1 F (36.7 C), Min:97.4 F (36.3 C), Max:98.5 F (36.9 C)   Recent Labs Lab 08/28/16 1523 08/28/16 1759 08/28/16 1827 08/29/16 0053 08/29/16 0404 08/30/16 0318  WBC 25.2*  --   --   --  17.7*  --   CREATININE  --  7.56*  --   --  4.99*  5.04* 1.53*  LATICACIDVEN  --   --  2.24* 1.1 1.2  --     Estimated Creatinine Clearance: 42.1 mL/min (A) (by C-G formula based on SCr of 1.53 mg/dL (H)).    Allergies  Allergen Reactions  . Iohexol Anaphylaxis  . Zofran [Ondansetron Hcl] Other (See Comments)    Pt has baseline prolonged QTc.  Marland Kitchen Levofloxacin Other (See Comments)    Pt has baseline prolonged QTc.    Marland Kitchen Morphine And Related Itching  . Heparin Itching and Other (See Comments)    Pt is able to tolerate IV heparin, but not sub-Q.  Marland Kitchen Penicillins Itching and Other (See Comments)    Has patient had a PCN reaction causing immediate rash, facial/tongue/throat swelling, SOB or lightheadedness with hypotension: No Has patient had a PCN reaction causing severe rash involving mucus membranes or skin necrosis: No Has patient had a PCN reaction that required hospitalization: No Has patient had a PCN reaction occurring within the last 10 years: Yes If all of the above answers are "NO", then may proceed with Cephalosporin use.      Antimicrobials this admission:  Vancomycin 08/29/2016 >> Cefepime 08/29/2016 >>  Ceftriaxone 8/18 x1  Dose adjustments  this admission:   Microbiology results:  8/19 BCx: sent 8/18 UCx: sent  8/18 MRSA PCR: sent C.diff ordered  Thank you for allowing pharmacy to be a part of this patient's care.  Hershal Coria 08/30/2016 7:46 AM

## 2016-08-30 NOTE — Evaluation (Signed)
Physical Therapy Evaluation Patient Details Name: [name] MRN: [id number] DOB: 09/17/1955 Today's Date: 08/30/2016   History of Present Illness  61 yo female admitted with N/V/D. Found to have sepsis, acute renal failure, hypotension. Hx of HTN, COPD, RA, cocain abuse, CKD  Clinical Impression  On eval, pt required Min assist for mobility. She walked ~75 feet in the hallway. Pt presents with general weakness, decreased activity tolerance, and impaired gait and balance. Pt c/o some dizziness during session. Pt noted some bleeding while up to bathroom-made RN aware at pt's request. Recommend HHPT and supervision for OOB/mobility initially. Will follow and progress activity as tolerated.     Follow Up Recommendations Home health PT;Supervision for mobility/OOB    Equipment Recommendations  None recommended by PT (pt stated she already has access to walkers at home)    Recommendations for Other Services       Precautions / Restrictions Precautions Precautions: Fall Restrictions Weight Bearing Restrictions: No      Mobility  Bed Mobility Overal bed mobility: Modified Independent                Transfers Overall transfer level: Needs assistance Equipment used: Straight cane Transfers: Sit to/from Stand Sit to Stand: Min assist         General transfer comment: Assist to stabilize. Pt c/o mild dizziness.   Ambulation/Gait Ambulation/Gait assistance: Min assist Ambulation Distance (Feet): 75 Feet Assistive device: Straight cane (IV pole) Gait Pattern/deviations: Step-through pattern;Decreased stride length     General Gait Details: Unsteady. Pt declined RW use. She held on to IV pole and straight cane.   Stairs            Wheelchair Mobility    Modified Rankin (Stroke Patients Only)       Balance Overall balance assessment: Needs assistance         Standing balance support: Bilateral upper extremity supported Standing balance-Leahy Scale:  Poor                               Pertinent Vitals/Pain Pain Assessment: No/denies pain    Home Living Family/patient expects to be discharged to:: Private residence Living Arrangements: Other relatives Available Help at Discharge: Family Type of Home: Apartment Home Access: Stairs to enter Entrance Stairs-Rails: Psychiatric nurse of Steps: 10 Home Layout: One level Home Equipment: Environmental consultant - 2 wheels;Cane - single point      Prior Function Level of Independence: Needs assistance   Gait / Transfers Assistance Needed: uses cane for ambulation  ADL's / Homemaking Assistance Needed: Pt reports she did not need assistance for ADLs        Hand Dominance        Extremity/Trunk Assessment   Upper Extremity Assessment Upper Extremity Assessment: Generalized weakness    Lower Extremity Assessment Lower Extremity Assessment: Generalized weakness    Cervical / Trunk Assessment Cervical / Trunk Assessment: Normal  Communication   Communication: No difficulties  Cognition Arousal/Alertness: Awake/alert Behavior During Therapy: WFL for tasks assessed/performed Overall Cognitive Status: Within Functional Limits for tasks assessed                                        General Comments      Exercises     Assessment/Plan    PT Assessment Patient needs continued PT services  PT  Problem List Decreased strength;Decreased mobility;Decreased activity tolerance;Decreased balance;Decreased knowledge of use of DME       PT Treatment Interventions DME instruction;Gait training;Therapeutic activities;Therapeutic exercise;Patient/family education;Balance training;Functional mobility training    PT Goals (Current goals can be found in the Care Plan section)  Acute Rehab PT Goals Patient Stated Goal: home. regain PLOF PT Goal Formulation: With patient Time For Goal Achievement: 09/13/16 Potential to Achieve Goals: Good     Frequency Min 3X/week   Barriers to discharge        Co-evaluation               AM-PAC PT "6 Clicks" Daily Activity  Outcome Measure Difficulty turning over in bed (including adjusting bedclothes, sheets and blankets)?: None Difficulty moving from lying on back to sitting on the side of the bed? : None Difficulty sitting down on and standing up from a chair with arms (e.g., wheelchair, bedside commode, etc,.)?: Unable Help needed moving to and from a bed to chair (including a wheelchair)?: A Little Help needed walking in hospital room?: A Little Help needed climbing 3-5 steps with a railing? : A Little 6 Click Score: 18    End of Session Equipment Utilized During Treatment: Gait belt Activity Tolerance: Patient tolerated treatment well Patient left: in bed;with call bell/phone within reach;with bed alarm set   PT Visit Diagnosis: Muscle weakness (generalized) (M62.81);Difficulty in walking, not elsewhere classified (R26.2)    Time: 4665-9935 PT Time Calculation (min) (ACUTE ONLY): 15 min   Charges:   PT Evaluation $PT Eval Low Complexity: 1 Low     PT G CodesJosiah Lobo 08/30/2016, 3:27 PM

## 2016-08-30 NOTE — Care Management Note (Signed)
Case Management Note  Patient Details  Name: [name] MRN: [id number] Date of Birth: 1955/06/26  Subjective/Objective: 61 y/o f admitted w/Acute renal failure. Hx: PE,COPD,CKD,smoker. From home w/family. Has a cane.Active w/[facility] HHC ordered-patient pleasantly declines any HHC. MD notified.                  Action/Plan:d/c plan home.   Expected Discharge Date:                  Expected Discharge Plan:  Home/Self Care  In-House Referral:     Discharge planning Services  CM Consult  Post Acute Care Choice:    Choice offered to:  Patient  DME Arranged:    DME Agency:     HH Arranged:  Patient Refused [place] Agency:     Status of Service:  In process, will continue to follow  If discussed at Long Length of Stay Meetings, dates discussed:    Additional Comments:  Dessa Phi, RN 08/30/2016, 2:22 PM

## 2016-08-30 NOTE — Discharge Instructions (Signed)
Acute Kidney Injury, Adult Acute kidney injury is a sudden worsening of kidney function. The kidneys are organs that have several jobs. They filter the blood to remove waste products and extra fluid. They also maintain a healthy balance of minerals and hormones in the body, which helps control blood pressure and keep bones strong. With this condition, your kidneys do not do their jobs as well as they should. This condition ranges from mild to severe. Over time it may develop into long-lasting (chronic) kidney disease. Early detection and treatment may prevent acute kidney injury from developing into a chronic condition. What are the causes? Common causes of this condition include:  A problem with blood flow to the kidneys. This may be caused by:  Low blood pressure (hypotension) or shock.  Blood loss.  Heart and blood vessel (cardiovascular) disease.  Severe burns.  Liver disease.  Direct damage to the kidneys. This may be caused by:  Certain medicines.  A kidney infection.  Poisoning.  Being around or in contact with toxic substances.  A surgical wound.  A hard, direct hit to the kidney area.  A sudden blockage of urine flow. This may be caused by:  Cancer.  Kidney stones.  An enlarged prostate in males. What are the signs or symptoms? Symptoms of this condition may not be obvious until the condition becomes severe. Symptoms of this condition can include:  Tiredness (lethargy), or difficulty staying awake.  Nausea or vomiting.  Swelling (edema) of the face, legs, ankles, or feet.  Problems with urination, such as:  Abdominal pain, or pain along the side of your stomach (flank).  Decreased urine production.  Decrease in the force of urine flow.  Muscle twitches and cramps, especially in the legs.  Confusion or trouble concentrating.  Loss of appetite.  Fever. How is this diagnosed? This condition may be diagnosed with tests, including:  Blood  tests.  Urine tests.  Imaging tests.  A test in which a sample of tissue is removed from the kidneys to be examined under a microscope (kidney biopsy). How is this treated? Treatment for this condition depends on the cause and how severe the condition is. In mild cases, treatment may not be needed. The kidneys may heal on their own. In more severe cases, treatment will involve:  Treating the cause of the kidney injury. This may involve changing any medicines you are taking or adjusting your dosage.  Fluids. You may need specialized IV fluids to balance your body's needs.  Having a catheter placed to drain urine and prevent blockages.  Preventing problems from occurring. This may mean avoiding certain medicines or procedures that can cause further injury to the kidneys. In some cases treatment may also require:  A procedure to remove toxic wastes from the body (dialysis or continuous renal replacement therapy - CRRT).  Surgery. This may be done to repair a torn kidney, or to remove the blockage from the urinary system. Follow these instructions at home: Medicines   Take over-the-counter and prescription medicines only as told by your health care provider.  Do not take any new medicines without your health care provider's approval. Many medicines can worsen your kidney damage.  Do not take any vitamin and mineral supplements without your health care provider's approval. Many nutritional supplements can worsen your kidney damage. Lifestyle   If your health care provider prescribed changes to your diet, follow them. You may need to decrease the amount of protein you eat.  Achieve and maintain a   healthy weight. If you need help with this, ask your health care provider.  Start or continue an exercise plan. Try to exercise at least 30 minutes a day, 5 days a week.  Do not use any tobacco products, such as cigarettes, chewing tobacco, and e-cigarettes. If you need help quitting, ask  your health care provider. General instructions   Keep track of your blood pressure. Report changes in your blood pressure as told by your health care provider.  Stay up to date with immunizations. Ask your health care provider which immunizations you need.  Keep all follow-up visits as told by your health care provider. This is important. Where to find more information:  American Association of Kidney Patients: www.aakp.org  National Kidney Foundation: www.kidney.org  American Kid[employer] Fund: www.akfinc.org  [employer] [facility]:  www.lifeoptions.org  www.kidneyschool.org Contact a health care provider if:  Your symptoms get worse.  You develop new symptoms. Get help right away if:  You develop symptoms of worsening kidney disease, which include:  Headaches.  Abnormally dark or light skin.  Easy bruising.  Frequent hiccups.  Chest pain.  Shortness of breath.  End of menstruation in women.  Seizures.  Confusion or altered mental status.  Abdominal or back pain.  Itchiness.  You have a fever.  Your body is producing less urine.  You have pain or bleeding when you urinate. Summary  Acute kidney injury is a sudden worsening of kidney function.  Acute kidney injury can be caused by problems with blood flow to the kidneys, direct damage to the kidneys, and sudden blockage of urine flow.  Symptoms of this condition may not be obvious until it becomes severe. Symptoms may include edema, lethargy, confusion, nausea or vomiting, and problems passing urine.  This condition can usually be diagnosed with blood tests, urine tests, and imaging tests. Sometimes a kidney biopsy is done to diagnose this condition.  Treatment for this condition often involves treating the underlying cause. It is treated with fluids, medicines, dialysis, diet changes, or surgery. This information is not intended to replace advice given to you by your health care provider.  Make sure you discuss any questions you have with your health care provider. Document Released: 07/13/2010 Document Revised: 12/19/2015 Document Reviewed: 12/19/2015 Elsevier Interactive Patient Education  2017 [employer]  

## 2016-08-30 NOTE — Progress Notes (Signed)
ANTICOAGULATION CONSULT NOTE - Follow Up Consult  Pharmacy Consult for Heparin while Eliquis on hold Indication: pulmonary embolus  Allergies  Allergen Reactions  . Iohexol Anaphylaxis  . Zofran [Ondansetron Hcl] Other (See Comments)    Pt has baseline prolonged QTc.  Marland Kitchen Levofloxacin Other (See Comments)    Pt has baseline prolonged QTc.    Marland Kitchen Morphine And Related Itching  . Heparin Itching and Other (See Comments)    Pt is able to tolerate IV heparin, but not sub-Q.  Marland Kitchen Penicillins Itching and Other (See Comments)    Has patient had a PCN reaction causing immediate rash, facial/tongue/throat swelling, SOB or lightheadedness with hypotension: No Has patient had a PCN reaction causing severe rash involving mucus membranes or skin necrosis: No Has patient had a PCN reaction that required hospitalization: No Has patient had a PCN reaction occurring within the last 10 years: Yes If all of the above answers are "NO", then may proceed with Cephalosporin use.      Patient Measurements: Height: 5' 6.5" (168.9 cm) Weight: 177 lb 7.5 oz (80.5 kg) IBW/kg (Calculated) : 60.45 Heparin Dosing Weight:   Vital Signs: Temp: 98.5 F (36.9 C) (08/19 1932) Temp Source: Axillary (08/19 1932) BP: 127/59 (08/19 1800) Pulse Rate: 94 (08/19 1900)  Labs:  Recent Labs  08/28/16 1523 08/28/16 1759 08/29/16 0404 08/29/16 2300  HGB 13.8  --  12.2  --   HCT 38.7  --  35.1*  --   PLT 328  --  276  --   APTT  --   --  24 60*  LABPROT  --   --  15.4*  --   INR  --   --  1.22  --   HEPARINUNFRC  --   --   --  1.06*  CREATININE  --  7.56* 4.99*  5.04*  --     Estimated Creatinine Clearance: 12.6 mL/min (A) (by C-G formula based on SCr of 4.99 mg/dL (H)).   Medications:  Infusions:  . sodium chloride    . heparin 1,000 Units/hr (08/29/16 1826)  .  sodium bicarbonate  infusion 1000 mL 50 mL/hr at 08/29/16 0831  . sodium chloride      Assessment: Patient with PTT just below goal.  Heparin  level is high, but PTT ordered with Heparin level until both correlate due to possible drug-lab interaction between oral anticoagulant (rivaroxaban, edoxaban, or apixaban) and anti-Xa level (aka heparin level)  No heparin issues per RN.  Goal of Therapy:  Heparin level 0.3-0.7 units/ml aPTT 66-102 seconds Monitor platelets by anticoagulation protocol: Yes   Plan:  Increase heparin to 1100 units/hr Recheck ptt and heparin level at 0900  Tyler Deis, Shea Stakes [place] 08/30/2016,12:11 AM

## 2016-08-30 NOTE — Progress Notes (Signed)
PT Cancellation Note  Patient Details Name: [name] MRN: [id number] DOB: 06-25-1955   Cancelled Treatment:    Reason Eval/Treat Not Completed: attempted PT eval-pt currently eating lunch. Will check back as schedule allows. Thanks.    Weston Anna, MPT Pager: [phone]

## 2016-08-30 NOTE — Progress Notes (Signed)
[facility] Progress Note  Subjective: no c/o's. Creat down 1.5  Vitals:   08/30/16 0400 08/30/16 0500 08/30/16 0514 08/30/16 1417  BP: (!) 149/61 (!) 142/67 (!) 154/73 (!) 162/87  Pulse: 65 73 67 86  Resp: 16 19 19 20   Temp:   98.1 F (36.7 C) 98.4 F (36.9 C)  TempSrc:   Oral Oral  SpO2: 100% 100% 100% 100%  Weight:   84.1 kg (185 lb 8 oz)   Height:   5' 6.5" (1.689 m)     Inpatient medications: . apixaban  5 mg Oral BID  . chlorhexidine  15 mL Mouth Rinse BID  . fluticasone  2 spray Each Nare Daily  . hydrocortisone sod succinate (SOLU-CORTEF) inj  100 mg Intravenous Q12H  . mouth rinse  15 mL Mouth Rinse q12n4p  . pantoprazole (PROTONIX) IV  40 mg Intravenous Q12H   . sodium chloride    . heparin 1,100 Units/hr (08/30/16 0007)  .  sodium bicarbonate  infusion 1000 mL 50 mL/hr at 08/29/16 0831  . sodium chloride     acetaminophen **OR** acetaminophen, albuterol, oxyCODONE, sodium chloride, sodium chloride  Exam: Gen:NAD CVS:no rub, RRR (HR documented at 32 but was 80 on exam) Resp:cta Abd:+BS, soft, mildly tender to deep palpation Ext:no edema NF, ox 3      Impression: 1.  AKI on CKD - due to vol depletion/ hypotension/ ACEi/ nsaid's/ cocaine. Resolving, creat down 7.5 > 4 > 1.5 here in the hospital. Would avoid ACEi's for 2-3 weeks. Has a hx of urinary retention.  She will need urology assistance as it is likely that urinary retention contributed to this episode.  Has seen Dr McDiarmid in the past. Have d/w primary MD.  No further suggestions, will sign off.   2.  Cocaine abuse 3.  Chron pain 4.  HTN - per primary, avoid ACEi 2-3 wks  Plan - as above   Kelly Splinter MD [facility] pager [phone]   08/30/2016, 3:47 PM    Recent Labs Lab 08/28/16 1759 08/29/16 0404 08/30/16 0318  NA 135 135  136 141  K 4.7 4.4  4.5 4.7  CL 111 111  111 112*  CO2 10* 15*  15* 21*  GLUCOSE 124* 114*  115* 122*  BUN 96* 86*  88*  40*  CREATININE 7.56* 4.99*  5.04* 1.53*  CALCIUM 8.9 8.6*  8.7* 8.2*  PHOS  --  4.2  --     Recent Labs Lab 08/28/16 1759 08/29/16 0404  AST 9* 11*  ALT 11* 12*  ALKPHOS 89 77  BILITOT 0.3 0.5  PROT 6.8 6.2*  ALBUMIN 3.0* 2.7*  2.7*    Recent Labs Lab 08/28/16 1523 08/29/16 0404  WBC 25.2* 17.7*  NEUTROABS 21.7* 16.1*  HGB 13.8 12.2  HCT 38.7 35.1*  MCV 87.0 87.3  PLT 328 276   Iron/TIBC/Ferritin/ %Sat    Component Value Date/Time   IRON 18 06/13/2015   TIBC 149 06/13/2015
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
## 2016-09-01 ENCOUNTER — Encounter (HOSPITAL_COMMUNITY): Payer: Self-pay

## 2016-09-01 ENCOUNTER — Inpatient Hospital Stay (HOSPITAL_COMMUNITY)
Admission: EM | Admit: 2016-09-01 | Discharge: 2016-09-04 | DRG: 394 | Disposition: A | Discharge status: Home / Self Care | Payer: Medicaid Other | Attending: Internal Medicine | Admitting: Internal Medicine

## 2016-09-01 DIAGNOSIS — Z881 Allergy status to other antibiotic agents status: Secondary | ICD-10-CM

## 2016-09-01 DIAGNOSIS — K921 Melena: Secondary | ICD-10-CM | POA: Diagnosis not present

## 2016-09-01 DIAGNOSIS — Z8249 Family history of ischemic heart disease and other diseases of the circulatory system: Secondary | ICD-10-CM

## 2016-09-01 DIAGNOSIS — K648 Other hemorrhoids: Secondary | ICD-10-CM | POA: Diagnosis present

## 2016-09-01 DIAGNOSIS — N183 Chronic kidney disease, stage 3 (moderate): Secondary | ICD-10-CM | POA: Diagnosis present

## 2016-09-01 DIAGNOSIS — Z7952 Long term (current) use of systemic steroids: Secondary | ICD-10-CM

## 2016-09-01 DIAGNOSIS — Z888 Allergy status to other drugs, medicaments and biological substances status: Secondary | ICD-10-CM

## 2016-09-01 DIAGNOSIS — Z90710 Acquired absence of both cervix and uterus: Secondary | ICD-10-CM

## 2016-09-01 DIAGNOSIS — F329 Major depressive disorder, single episode, unspecified: Secondary | ICD-10-CM | POA: Diagnosis present

## 2016-09-01 DIAGNOSIS — K559 Vascular disorder of intestine, unspecified: Principal | ICD-10-CM | POA: Diagnosis present

## 2016-09-01 DIAGNOSIS — Z91041 Radiographic dye allergy status: Secondary | ICD-10-CM

## 2016-09-01 DIAGNOSIS — D649 Anemia, unspecified: Secondary | ICD-10-CM | POA: Diagnosis present

## 2016-09-01 DIAGNOSIS — Z87442 Personal history of urinary calculi: Secondary | ICD-10-CM

## 2016-09-01 DIAGNOSIS — Z880 Allergy status to penicillin: Secondary | ICD-10-CM

## 2016-09-01 DIAGNOSIS — F1410 Cocaine abuse, uncomplicated: Secondary | ICD-10-CM | POA: Diagnosis present

## 2016-09-01 DIAGNOSIS — K922 Gastrointestinal hemorrhage, unspecified: Secondary | ICD-10-CM

## 2016-09-01 DIAGNOSIS — M069 Rheumatoid arthritis, unspecified: Secondary | ICD-10-CM | POA: Diagnosis present

## 2016-09-01 DIAGNOSIS — K633 Ulcer of intestine: Secondary | ICD-10-CM | POA: Diagnosis present

## 2016-09-01 DIAGNOSIS — Z86711 Personal history of pulmonary embolism: Secondary | ICD-10-CM

## 2016-09-01 DIAGNOSIS — Z87891 Personal history of nicotine dependence: Secondary | ICD-10-CM

## 2016-09-01 DIAGNOSIS — J449 Chronic obstructive pulmonary disease, unspecified: Secondary | ICD-10-CM | POA: Diagnosis present

## 2016-09-01 DIAGNOSIS — K529 Noninfective gastroenteritis and colitis, unspecified: Secondary | ICD-10-CM

## 2016-09-01 DIAGNOSIS — K219 Gastro-esophageal reflux disease without esophagitis: Secondary | ICD-10-CM | POA: Diagnosis present

## 2016-09-01 DIAGNOSIS — Z825 Family history of asthma and other chronic lower respiratory diseases: Secondary | ICD-10-CM

## 2016-09-01 DIAGNOSIS — I129 Hypertensive chronic kidney disease with stage 1 through stage 4 chronic kidney disease, or unspecified chronic kidney disease: Secondary | ICD-10-CM | POA: Diagnosis present

## 2016-09-01 DIAGNOSIS — Z885 Allergy status to narcotic agent status: Secondary | ICD-10-CM

## 2016-09-01 LAB — URINALYSIS, ROUTINE W REFLEX MICROSCOPIC
BILIRUBIN URINE: NEGATIVE
GLUCOSE, UA: NEGATIVE mg/dL
HGB URINE DIPSTICK: NEGATIVE
KETONES UR: NEGATIVE mg/dL
Leukocytes, UA: NEGATIVE
Nitrite: NEGATIVE
PH: 6 (ref 5.0–8.0)
Protein, ur: NEGATIVE mg/dL
SPECIFIC GRAVITY, URINE: 1.016 (ref 1.005–1.030)

## 2016-09-01 LAB — LIPASE, BLOOD: LIPASE: 22 U/L (ref 11–51)

## 2016-09-01 LAB — COMPREHENSIVE METABOLIC PANEL
ALBUMIN: 2.8 g/dL — AB (ref 3.5–5.0)
ALT: 17 U/L (ref 14–54)
ANION GAP: 4 — AB (ref 5–15)
AST: 17 U/L (ref 15–41)
Alkaline Phosphatase: 74 U/L (ref 38–126)
BILIRUBIN TOTAL: 0.2 mg/dL — AB (ref 0.3–1.2)
BUN: 16 mg/dL (ref 6–20)
CALCIUM: 8.3 mg/dL — AB (ref 8.9–10.3)
CO2: 29 mmol/L (ref 22–32)
Chloride: 107 mmol/L (ref 101–111)
Creatinine, Ser: 1.03 mg/dL — ABNORMAL HIGH (ref 0.44–1.00)
GFR calc Af Amer: >60 mL/min (ref >60)
GFR, EST NON AFRICAN AMERICAN: 57 mL/min — AB (ref >60)
GLUCOSE: 91 mg/dL (ref 65–99)
POTASSIUM: 4.2 mmol/L (ref 3.5–5.1)
Sodium: 140 mmol/L (ref 135–145)
TOTAL PROTEIN: 5.8 g/dL — AB (ref 6.5–8.1)

## 2016-09-01 LAB — CBC
HCT: 33.2 % — ABNORMAL LOW (ref 36.0–46.0)
HCT: 33.8 % — ABNORMAL LOW (ref 36.0–46.0)
HEMATOCRIT: 33.6 % — AB (ref 36.0–46.0)
HEMOGLOBIN: 11.5 g/dL — AB (ref 12.0–15.0)
Hemoglobin: 11.2 g/dL — ABNORMAL LOW (ref 12.0–15.0)
Hemoglobin: 11.5 g/dL — ABNORMAL LOW (ref 12.0–15.0)
MCH: 29.9 pg (ref 26.0–34.0)
MCH: 30.2 pg (ref 26.0–34.0)
MCH: 29.5 pg (ref 26.0–34.0)
MCHC: 33.7 g/dL (ref 30.0–36.0)
MCHC: 34.2 g/dL (ref 30.0–36.0)
MCHC: 34 g/dL (ref 30.0–36.0)
MCV: 88.5 fL (ref 78.0–100.0)
MCV: 88.2 fL (ref 78.0–100.0)
MCV: 86.7 fL (ref 78.0–100.0)
PLATELETS: 247 10*3/uL (ref 150–400)
PLATELETS: 218 10*3/uL (ref 150–400)
Platelets: 269 10*3/uL (ref 150–400)
RBC: 3.75 MIL/uL — ABNORMAL LOW (ref 3.87–5.11)
RBC: 3.81 MIL/uL — ABNORMAL LOW (ref 3.87–5.11)
RBC: 3.9 MIL/uL (ref 3.87–5.11)
RDW: 14.6 % (ref 11.5–15.5)
RDW: 14.5 % (ref 11.5–15.5)
RDW: 14.3 % (ref 11.5–15.5)
WBC: 11 10*3/uL — AB (ref 4.0–10.5)
WBC: 12.3 10*3/uL — AB (ref 4.0–10.5)
WBC: 10.6 10*3/uL — AB (ref 4.0–10.5)

## 2016-09-01 LAB — TYPE AND SCREEN
ABO/RH(D): B POS
ANTIBODY SCREEN: NEGATIVE

## 2016-09-01 LAB — PROTIME-INR
INR: 0.92
PROTHROMBIN TIME: 12.4 s (ref 11.4–15.2)

## 2016-09-01 LAB — AMYLASE: AMYLASE: 210 U/L — AB (ref 28–100)

## 2016-09-01 LAB — APTT: APTT: 23 s — AB (ref 24–36)

## 2016-09-01 LAB — POC OCCULT BLOOD, ED: Fecal Occult Bld: POSITIVE — AB

## 2016-09-01 MED ORDER — CEFPODOXIME PROXETIL 200 MG PO TABS
200.0000 mg | ORAL_TABLET | Freq: Two times a day (BID) | ORAL | Status: DC
  Administered 2016-09-01 – 2016-09-03 (×6): 200 mg via ORAL
  Filled 2016-09-01 (×8): qty 1

## 2016-09-01 MED ORDER — PANTOPRAZOLE SODIUM 40 MG PO TBEC
40.0000 mg | DELAYED_RELEASE_TABLET | Freq: Every day | ORAL | Status: DC
  Administered 2016-09-01 – 2016-09-03 (×3): 40 mg via ORAL
  Filled 2016-09-01 (×3): qty 1

## 2016-09-01 MED ORDER — ACETAMINOPHEN 650 MG RE SUPP
650.0000 mg | Freq: Four times a day (QID) | RECTAL | Status: DC | PRN
Start: 2016-09-01 — End: 2016-09-04

## 2016-09-01 MED ORDER — SODIUM CHLORIDE 0.9 % IV SOLN
INTRAVENOUS | Status: DC
  Administered 2016-09-01 – 2016-09-04 (×4): via INTRAVENOUS

## 2016-09-01 MED ORDER — ACETAMINOPHEN 325 MG PO TABS
650.0000 mg | ORAL_TABLET | Freq: Four times a day (QID) | ORAL | Status: DC | PRN
  Administered 2016-09-01 – 2016-09-03 (×3): 650 mg via ORAL
  Filled 2016-09-01 (×3): qty 2
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
## 2016-09-01 NOTE — ED Notes (Signed)
POC Occult Blood Positive. DR Zenia Resides aware.

## 2016-09-01 NOTE — Progress Notes (Signed)
Pt rating pain 10/10. Refusing tylenol as ordered. MD paged.

## 2016-09-01 NOTE — ED Triage Notes (Signed)
Patient states she was discharged on 08/30/16 for rectal bleeding. Patient states she is still having rectal bleeding. Patient repeatedly stated she needs to go to rehab because she can not read and tell how to take her medication. Patient states she was unable to fill her new prescription that she was given at discharge.
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
## 2016-09-01 NOTE — ED Notes (Signed)
This Probation officer at bedside with provider rectal exam. Post exam pt able to change self into gown with minimal assistance.

## 2016-09-02 DIAGNOSIS — Z880 Allergy status to penicillin: Secondary | ICD-10-CM | POA: Diagnosis not present

## 2016-09-02 DIAGNOSIS — Z90710 Acquired absence of both cervix and uterus: Secondary | ICD-10-CM | POA: Diagnosis not present

## 2016-09-02 DIAGNOSIS — D649 Anemia, unspecified: Secondary | ICD-10-CM

## 2016-09-02 DIAGNOSIS — K633 Ulcer of intestine: Secondary | ICD-10-CM | POA: Diagnosis present

## 2016-09-02 DIAGNOSIS — I10 Essential (primary) hypertension: Secondary | ICD-10-CM | POA: Diagnosis not present

## 2016-09-02 DIAGNOSIS — I129 Hypertensive chronic kidney disease with stage 1 through stage 4 chronic kidney disease, or unspecified chronic kidney disease: Secondary | ICD-10-CM | POA: Diagnosis present

## 2016-09-02 DIAGNOSIS — Z8249 Family history of ischemic heart disease and other diseases of the circulatory system: Secondary | ICD-10-CM | POA: Diagnosis not present

## 2016-09-02 DIAGNOSIS — M069 Rheumatoid arthritis, unspecified: Secondary | ICD-10-CM | POA: Diagnosis present

## 2016-09-02 DIAGNOSIS — K529 Noninfective gastroenteritis and colitis, unspecified: Secondary | ICD-10-CM | POA: Diagnosis not present

## 2016-09-02 DIAGNOSIS — Z86711 Personal history of pulmonary embolism: Secondary | ICD-10-CM | POA: Diagnosis not present

## 2016-09-02 DIAGNOSIS — Z91041 Radiographic dye allergy status: Secondary | ICD-10-CM | POA: Diagnosis not present

## 2016-09-02 DIAGNOSIS — Z87891 Personal history of nicotine dependence: Secondary | ICD-10-CM | POA: Diagnosis not present

## 2016-09-02 DIAGNOSIS — K922 Gastrointestinal hemorrhage, unspecified: Secondary | ICD-10-CM | POA: Diagnosis present

## 2016-09-02 DIAGNOSIS — K921 Melena: Secondary | ICD-10-CM | POA: Diagnosis not present

## 2016-09-02 DIAGNOSIS — K219 Gastro-esophageal reflux disease without esophagitis: Secondary | ICD-10-CM | POA: Diagnosis present

## 2016-09-02 DIAGNOSIS — K648 Other hemorrhoids: Secondary | ICD-10-CM | POA: Diagnosis present

## 2016-09-02 DIAGNOSIS — F1410 Cocaine abuse, uncomplicated: Secondary | ICD-10-CM | POA: Diagnosis present

## 2016-09-02 DIAGNOSIS — Z7952 Long term (current) use of systemic steroids: Secondary | ICD-10-CM | POA: Diagnosis not present

## 2016-09-02 DIAGNOSIS — K559 Vascular disorder of intestine, unspecified: Secondary | ICD-10-CM | POA: Diagnosis present

## 2016-09-02 DIAGNOSIS — Z885 Allergy status to narcotic agent status: Secondary | ICD-10-CM | POA: Diagnosis not present

## 2016-09-02 DIAGNOSIS — Z87442 Personal history of urinary calculi: Secondary | ICD-10-CM | POA: Diagnosis not present

## 2016-09-02 DIAGNOSIS — F329 Major depressive disorder, single episode, unspecified: Secondary | ICD-10-CM | POA: Diagnosis present

## 2016-09-02 DIAGNOSIS — J449 Chronic obstructive pulmonary disease, unspecified: Secondary | ICD-10-CM | POA: Diagnosis present

## 2016-09-02 DIAGNOSIS — Z888 Allergy status to other drugs, medicaments and biological substances status: Secondary | ICD-10-CM | POA: Diagnosis not present

## 2016-09-02 DIAGNOSIS — Z881 Allergy status to other antibiotic agents status: Secondary | ICD-10-CM | POA: Diagnosis not present

## 2016-09-02 DIAGNOSIS — N183 Chronic kidney disease, stage 3 (moderate): Secondary | ICD-10-CM | POA: Diagnosis present

## 2016-09-02 DIAGNOSIS — Z825 Family history of asthma and other chronic lower respiratory diseases: Secondary | ICD-10-CM | POA: Diagnosis not present

## 2016-09-02 LAB — CBC
HEMATOCRIT: 31.8 % — AB (ref 36.0–46.0)
HEMOGLOBIN: 10.9 g/dL — AB (ref 12.0–15.0)
MCH: 30 pg (ref 26.0–34.0)
MCHC: 34.3 g/dL (ref 30.0–36.0)
MCV: 87.6 fL (ref 78.0–100.0)
Platelets: 248 10*3/uL (ref 150–400)
RBC: 3.63 MIL/uL — ABNORMAL LOW (ref 3.87–5.11)
RDW: 14.1 % (ref 11.5–15.5)
WBC: 9.4 10*3/uL (ref 4.0–10.5)

## 2016-09-02 LAB — URINE CULTURE: CULTURE: NO GROWTH

## 2016-09-02 LAB — BASIC METABOLIC PANEL
Anion gap: 6 (ref 5–15)
BUN: 12 mg/dL (ref 6–20)
CHLORIDE: 109 mmol/L (ref 101–111)
CO2: 24 mmol/L (ref 22–32)
CREATININE: 0.93 mg/dL (ref 0.44–1.00)
Calcium: 8 mg/dL — ABNORMAL LOW (ref 8.9–10.3)
GFR calc Af Amer: >60 mL/min (ref >60)
GFR calc non Af Amer: >60 mL/min (ref >60)
Glucose, Bld: 96 mg/dL (ref 65–99)
Potassium: 4.1 mmol/L (ref 3.5–5.1)
Sodium: 139 mmol/L (ref 135–145)

## 2016-09-02 LAB — HIV ANTIBODY (ROUTINE TESTING W REFLEX): HIV Screen 4th Generation wRfx: NONREACTIVE

## 2016-09-02 MED ORDER — PEG 3350-KCL-NA BICARB-NACL 420 G PO SOLR
4000.0000 mL | Freq: Once | ORAL | Status: AC
  Administered 2016-09-02: 4000 mL via ORAL
  Filled 2016-09-02: qty 4000

## 2016-09-02 MED ORDER — SODIUM CHLORIDE 0.9 % IV SOLN
INTRAVENOUS | Status: DC
  Administered 2016-09-02: via INTRAVENOUS
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
## 2016-09-02 NOTE — Clinical Social Work Note (Signed)
Clinical Social Work Assessment  Patient Details  Name: [name] MRN: [id number] Date of Birth: [birth year]  Date of referral:  09/02/16               Reason for consult:  Discharge Planning                Permission sought to share information with:    Permission granted to share information::     Name::        Agency::     Relationship::     Contact Information:     Housing/Transportation Living arrangements for the past 2 months:  Single Family Home Source of Information:  Patient, Medical Team Patient Interpreter Needed:  None Criminal Activity/Legal Involvement Pertinent to Current Situation/Hospitalization:  No - Comment as needed Significant Relationships:  Adult Children, Other Family Members Lives with:  Self, Siblings Do you feel safe going back to the place where you live?  Yes Need for family participation in patient care:  No (Coment)  Care giving concerns:  Pt from home where she resides alone "but my sister stays with me a lot." States currently "sister is in [place] and [place] been thinking I should go to rehab so someone can help me cook." Pt states she walks independently and "wasn't able to walk around as good because my swollen feet." States she completes ADLs independently.   Social Worker assessment / plan:  CSW consulted due to pt expressing interest in rehab at [facility]. Met with pt at bedside. Alert and oriented x 4, irritable but cooperative.  At this point no skilled needs identified. Pt clarifies she "wanted to go somewhere where people could cook my food." Denies any mobility issues (no PT eval at this time- pt sitting up in chair having just finished making her bed at time of assessment). CSW discussed the process of facility placement- explained that should skilled need arise, referrals can be made and facility would require pt to sign over her income and stay 30 days due to [facility] requirements for SNF. Pt adamantly denied that she is interested in  this. Discussed assisted living facility options and pt denied this as well, states, "I stay in my own house, I am not trying to give up my check." States she has had home health RN in the past and would like to have this service if available at [facility] as well. Unsure of agency she used previously. Pt states she has "lots of family that live close by. I have kids and grandkids and 6 great-grandkids. I'll be good at home."   Plan: Pt plans to return home at [place]. Requests Home Health if appropriate.   Employment status:  Unemployed Forensic scientist:  Medicaid In [place] PT Recommendations:  Not assessed at this time Information / Referral to community resources:     Patient/Family's Response to care:  Pt irritable re: NPO status, otherwise pleased  Patient/Family's Understanding of and Emotional Response to Diagnosis, Current Treatment, and Prognosis:  Pt demonstrates adequate understanding of her situation at time of this interaction. Somewhat distracted (repeated "You better take that NPO sign down and get me some food") and irritable, however she did seem accepting and firm in her plans.   Emotional Assessment Appearance:  Appears stated age Attitude/Demeanor/Rapport:   (cooperative but irritable) Affect (typically observed):  Calm Orientation:  Oriented to Self, Oriented to Place, Oriented to  Time, Oriented to Situation Alcohol / Substance use:  Not Applicable Psych involvement (Current and /or in  the community):  No (Comment)  Discharge Needs  Concerns to be addressed:  Discharge Planning Concerns Readmission within the last 30 days:  No Current discharge risk:  None Barriers to Discharge:  Continued Medical Work up   [employer], LCSW 09/02/2016, 3:59 PM  [phone]

## 2016-09-02 NOTE — Progress Notes (Signed)
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 She also was noted to have sepsis of unknown source. She responded well to IV fluids, antibiotics and was discharged home. Foley catheter was left in place for reasons that are not entirely clear. Patient presented back to the emergency department with complaints of weakness and presence of blood in stool. She was hospitalized for further management.  Reason for Visit: Hematochezia  Consultants: Gastroenterology  Procedures: None yet  Antibiotics: Cefpodoxime  Subjective/Interval History: Patient complains of feeling tired. The amount of blood that she has seen in her stools is small to moderate. Complains of discomfort with Foley catheter. Denies any nausea, vomiting.  ROS: No chest pain or shortness of breath.  Objective:  Vital Signs  Vitals:   09/01/16 1500 09/01/16 1530 09/01/16 2054 09/02/16 0438  BP: (!) 148/89 (!) 140/98 112/65 (!) 157/82  Pulse: 71 91 (!) 104 75  Resp:  16 18 18   Temp:  98.4 F (36.9 C) 98.3 F (36.8 C) 98.4 F (36.9 C)  TempSrc:  Oral Oral Oral  SpO2:  100% 100% 100%  Weight:  83.2 kg (183 lb 6.8 oz)    Height:  5\' 6"  (1.676 m)      Intake/Output Summary (Last 24 hours) at 09/02/16 1143 Last data filed at 09/02/16 0440  Gross per 24 hour  Intake            28.33 ml  Output              900 ml  Net          -871.67 ml   Filed Weights   09/01/16 0957 09/01/16 1530  Weight: 83.9 kg (185 lb) 83.2 kg (183 lb 6.8 oz)    General appearance: alert, cooperative, appears stated age and no distress Head: Normocephalic, without  obvious abnormality, atraumatic Resp: clear to auscultation bilaterally Cardio: regular rate and rhythm, S1, S2 normal, no murmur, click, rub or gallop GI: soft, non-tender; bowel sounds normal; no masses,  no organomegaly Extremities: extremities normal, atraumatic, no cyanosis or edema Pulses: 2+ and symmetric Neurologic: No focal neurological deficits.  Lab Results:  Data Reviewed: I have personally reviewed following labs and imaging studies  CBC:  Recent Labs Lab 08/28/16 1523 08/29/16 0404 09/01/16 1123 09/01/16 1500 09/01/16 2025 09/02/16 0725  WBC 25.2* 17.7* 11.0* 12.3* 10.6* 9.4  NEUTROABS 21.7* 16.1*  --   --   --   --   HGB 13.8 12.2 11.2* 11.5* 11.5* 10.9*  HCT 38.7 35.1* 33.2* 33.6* 33.8* 31.8*  MCV 87.0 87.3 88.5 88.2 86.7 87.6  PLT 328 276 269 247 218 774    Basic Metabolic Panel:  Recent Labs Lab 08/28/16 1759 08/29/16 0404 08/30/16 0318 09/01/16 1123 09/02/16 0725  NA 135 135  136 141 140 139  K 4.7 4.4  4.5 4.7 4.2 4.1  CL 111 111  111 112* 107 109  CO2 10* 15*  15* 21* 29 24  GLUCOSE 124* 114*  115* 122* 91 96  BUN 96* 86*  88* 40* 16 12  CREATININE 7.56* 4.99*  5.04* 1.53* 1.03* 0.93  CALCIUM 8.9 8.6*  8.7* 8.2* 8.3* 8.0*  PHOS  --  4.2  --   --   --     GFR: Estimated Creatinine Clearance: 68.2 mL/min (by C-G formula based on SCr of 0.93 mg/dL).  Liver Function Tests:  Recent Labs Lab 08/28/16 1759 08/29/16 0404 09/01/16 1123  AST 9* 11* 17  ALT 11* 12* 17  ALKPHOS 89 77 74  BILITOT 0.3 0.5 0.2*  PROT 6.8 6.2* 5.8*  ALBUMIN 3.0* 2.7*  2.7* 2.8*     Recent Labs Lab 08/28/16 1759 09/01/16 2025  LIPASE 28 22  AMYLASE  --  210*    Coagulation Profile:  Recent Labs Lab 08/29/16 0404 09/01/16 1500  INR 1.22 0.92     Recent Results (from the past 240 hour(s))  Urine Culture     Status: Abnormal   Collection Time: 08/28/16  5:58 PM  Result Value Ref Range Status   Specimen Description URINE, RANDOM   Final   Special Requests NONE  Final   Culture MULTIPLE SPECIES PRESENT, SUGGEST RECOLLECTION (A)  Final   Report Status 08/30/2016 FINAL  Final  Blood Culture (routine x 2)     Status: None (Preliminary result)   Collection Time: 08/28/16  6:07 PM  Result Value Ref Range Status   Specimen Description BLOOD RIGHT HAND  Final   Special Requests   Final    BOTTLES DRAWN AEROBIC AND ANAEROBIC Blood Culture adequate volume   Culture   Final    NO GROWTH 4 DAYS Performed at [facility], [place] [address] [place], [place] [address]    Report Status PENDING  Incomplete  MRSA PCR Screening     Status: None   Collection Time: 08/28/16 10:46 PM  Result Value Ref Range Status   MRSA by PCR NEGATIVE NEGATIVE Final    Comment:        The GeneXpert MRSA Assay (FDA approved for NASAL specimens only), is one component of a comprehensive MRSA colonization surveillance program. It is not intended to diagnose MRSA infection nor to guide or monitor treatment for MRSA infections.   Blood Culture (routine x 2)     Status: None (Preliminary result)   Collection Time: 08/29/16  4:04 AM  Result Value Ref Range Status   Specimen Description BLOOD LEFT ANTECUBITAL  Final   Special Requests IN PEDIATRIC BOTTLE Blood Culture adequate volume  Final   Culture   Final    NO GROWTH 4 DAYS Performed at [facility], [place] [address], [place], [place] [address]    Report Status PENDING  Incomplete  Urine Culture     Status: None   Collection Time: 09/01/16  1:18 PM  Result Value Ref Range Status   Specimen Description URINE, CATHETERIZED  Final   Special Requests NONE  Final   Culture   Final    NO GROWTH Performed at [facility], [place] [address], [place], [place] [address]    Report Status 09/02/2016 FINAL  Final      Radiology Studies: No results found.   Medications:  Scheduled: . cefpodoxime  200 mg Oral BID  . pantoprazole  40 mg Oral Daily   Continuous: . sodium  chloride 100 mL/hr at 09/01/16 1543   JAS:[id number] **OR** acetaminophen  Assessment/Plan:  Principal Problem:   Hematochezia    Hematochezia/abdominal pain This is been ongoing for several weeks. Etiology is unclear. Patient has never had a colonoscopy. Hemoglobin has been stable. Per nursing staff patient has not had a bowel movement or any bleeding so  far since admission. Gastroenterology has been consulted and we await their input. Patient remains nothing by mouth. Abdomen was benign on examination. Recent CT scan did not show any concerning findings. Amylase level noted to be elevated but lipase is normal.   Normocytic anemia. Mild drop in hemoglobin is likely dilutional. There could be an element of acute blood loss as well.  Recent sepsis. No clear source for sepsis was found during her previous hospitalization. She was initially on IV antibiotics and was discharged on cefpodoxime. Continue the same for now.  Chronic kidney disease stage III with recent acute renal failure. Renal function is back to baseline. However, she is noted to have a Foley catheter, which was left in place during her discharge. Reason for this is not entirely clear. She does have a history of urinary retention in the past. However, has not had any recent problems urinating. Foley catheter was placed during previous hospitalization, mainly for strict ins and outs, as patient was encephalopathic. We will do a voiding trial. Remove catheter today. CT scan done during previous hospitalization did not show any hydronephrosis. She has seen urology as outpatient for urinary complaints and she tells me that she was prescribed antibiotics when she saw them last.  Essential hypertension. Blood pressure medications are on hold for now. Monitor blood pressures closely.  History of pulmonary embolism. Patient noted to be on Eliquis. PE was diagnosed in June for 2017 based on an indeterminate VQ scan. Unclear if she  needs to continue anticoagulation are not. Hold for now.  History of rheumatoid arthritis. She apparently is on prednisone on a chronic basis. This is not showing up on her medication list from this admission. We will need to verify with patient.  History of COPD Stable. Continue home medications.  DVT Prophylaxis: SCDs    Code Status: Full code  Family Communication: Discussed with the patient  Disposition Plan: Await gastroenterology input.    LOS: 0 days   [facility] Pager [phone] 09/02/2016, 11:43 AM  If 7PM-7AM, please contact night-coverage at [email or web address], password [facility]

## 2016-09-03 LAB — CULTURE, BLOOD (ROUTINE X 2)
CULTURE: NO GROWTH
CULTURE: NO GROWTH
SPECIAL REQUESTS: ADEQUATE
SPECIAL REQUESTS: ADEQUATE

## 2016-09-03 LAB — BASIC METABOLIC PANEL
ANION GAP: 7 (ref 5–15)
BUN: 9 mg/dL (ref 6–20)
CALCIUM: 8.1 mg/dL — AB (ref 8.9–10.3)
CO2: 21 mmol/L — AB (ref 22–32)
Chloride: 107 mmol/L (ref 101–111)
Creatinine, Ser: 0.83 mg/dL (ref 0.44–1.00)
GFR calc Af Amer: >60 mL/min (ref >60)
GFR calc non Af Amer: >60 mL/min (ref >60)
GLUCOSE: 103 mg/dL — AB (ref 65–99)
Potassium: 4.3 mmol/L (ref 3.5–5.1)
Sodium: 135 mmol/L (ref 135–145)

## 2016-09-03 LAB — CBC
HEMATOCRIT: 34.8 % — AB (ref 36.0–46.0)
Hemoglobin: 11.9 g/dL — ABNORMAL LOW (ref 12.0–15.0)
MCH: 30.1 pg (ref 26.0–34.0)
MCHC: 34.2 g/dL (ref 30.0–36.0)
MCV: 87.9 fL (ref 78.0–100.0)
Platelets: 272 10*3/uL (ref 150–400)
RBC: 3.96 MIL/uL (ref 3.87–5.11)
RDW: 13.9 % (ref 11.5–15.5)
WBC: 10.5 10*3/uL (ref 4.0–10.5)

## 2016-09-03 MED ORDER — PEG 3350-KCL-NA BICARB-NACL 420 G PO SOLR
4000.0000 mL | Freq: Once | ORAL | Status: DC
  Filled 2016-09-03: qty 4000

## 2016-09-03 MED ORDER — OXYCODONE-ACETAMINOPHEN 5-325 MG PO TABS
2.0000 | ORAL_TABLET | Freq: Once | ORAL | Status: AC
  Administered 2016-09-03: 2 via ORAL
  Filled 2016-09-03: qty 2

## 2016-09-03 NOTE — Progress Notes (Signed)
Subjective: She was not able to finish the prep.  Objective: Vital signs in last 24 hours: Temp:  [98.7 F (37.1 C)-99.4 F (37.4 C)] 98.9 F (37.2 C) (08/24 0450) Pulse Rate:  [77-104] 79 (08/24 0450) Resp:  [16-18] 16 (08/24 0450) BP: (116-145)/(79-101) 142/79 (08/24 0450) SpO2:  [97 %-99 %] 97 % (08/24 0450) Last BM Date: 08/28/16  Intake/Output from previous day: 08/23 0701 - 08/24 0700 In: 400 [P.O.:400] Out: 850 [Urine:850] Intake/Output this shift: No intake/output data recorded.  General appearance: alert and no distress GI: soft, non-tender; bowel sounds normal; no masses,  no organomegaly  Lab Results:  Recent Labs  09/01/16 2025 09/02/16 0725 09/03/16 0338  WBC 10.6* 9.4 10.5  HGB 11.5* 10.9* 11.9*  HCT 33.8* 31.8* 34.8*  PLT 218 248 272   BMET  Recent Labs  09/01/16 1123 09/02/16 0725 09/03/16 0338  NA 140 139 135  K 4.2 4.1 4.3  CL 107 109 107  CO2 29 24 21*  GLUCOSE 91 96 103*  BUN 16 12 9   CREATININE 1.03* 0.93 0.83  CALCIUM 8.3* 8.0* 8.1*   LFT  Recent Labs  09/01/16 1123  PROT 5.8*  ALBUMIN 2.8*  AST 17  ALT 17  ALKPHOS 74  BILITOT 0.2*   PT/INR  Recent Labs  09/01/16 1500  LABPROT 12.4  INR 0.92   Hepatitis Panel No results for input(s): HEPBSAG, HCVAB, HEPAIGM, HEPBIGM in the last 72 hours. C-Diff No results for input(s): CDIFFTOX in the last 72 hours. Fecal Lactopherrin No results for input(s): FECLLACTOFRN in the last 72 hours.  Studies/Results: No results found.  Medications:  Scheduled: . cefpodoxime  200 mg Oral BID  . pantoprazole  40 mg Oral Daily   Continuous: . sodium chloride 100 mL/hr at 09/02/16 1924  . sodium chloride 20 mL/hr at 09/02/16 2351    Assessment/Plan: 1) Hematochezia. 2) Anemia.   Nursing reported that she refused to finish the prep.  She states that she will try today as she "wants to know" about her bleeding issue.  Plan: 1) Prep for colonoscopy tomorrow.  LOS: 1 day    Keven Soucy D 09/03/2016, 10:43 AM

## 2016-09-03 NOTE — Progress Notes (Addendum)
Patient is refusing to complete the rest of her bowel prep and noncompliant to NPO status. Patient educated on the importance of compliance for colonoscopy in the morning. Unable to get a hold of oncall GI service. [facility] Floor coverage K. Schorr notified.Will continue to monitor.

## 2016-09-03 NOTE — Progress Notes (Signed)
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 She also was noted to have sepsis of unknown source. She responded well to IV fluids, antibiotics and was discharged home. Foley catheter was left in place for reasons that are not entirely clear. Patient presented back to the emergency department with complaints of weakness and presence of blood in stool. She was hospitalized for further management.  Reason for Visit: Hematochezia  Consultants: Gastroenterology  Procedures: Plan is for colonoscopy during this hospitalization  Antibiotics: Cefpodoxime  Subjective/Interval History: Patient feels well. Cannot complete her prep last night. Denies any further episodes of bleeding. No abdominal pain. No nausea or vomiting.   ROS: Denies any chest pain or shortness of breath.  Objective:  Vital Signs  Vitals:   09/02/16 0438 09/02/16 1433 09/02/16 2035 09/03/16 0450  BP: (!) 157/82 (!) 116/101 (!) 145/82 (!) 142/79  Pulse: 75 (!) 104 77 79  Resp: 18 18 18 16   Temp: 98.4 F (36.9 C) 98.7 F (37.1 C) 99.4 F (37.4 C) 98.9 F (37.2 C)  TempSrc: Oral Oral Oral Oral  SpO2: 100% 99% 98% 97%  Weight:      Height:        Intake/Output Summary (Last 24 hours) at 09/03/16 1152 Last data filed at 09/02/16 1834  Gross per 24 hour  Intake              400 ml  Output              850 ml  Net             -450 ml   Filed Weights   09/01/16 0957 09/01/16 1530  Weight: 83.9 kg (185 lb) 83.2 kg (183 lb 6.8 oz)    General appearance: Awake, alert. In no distress. Resp: Clear to auscultation bilaterally. No wheezing, rales  or rhonchi. Cardio: S1, S2 is normal, regular. No S3, S4. No rubs, murmurs or bruit GI: Abdomen is soft. Nontender, nondistended. Bowel sounds are present. No masses or organomegaly Extremities: No edema Neurologic: No focal neurological deficits  Lab Results:  Data Reviewed: I have personally reviewed following labs and imaging studies  CBC:  Recent Labs Lab 08/28/16 1523 08/29/16 0404 09/01/16 1123 09/01/16 1500 09/01/16 2025 09/02/16 0725 09/03/16 0338  WBC 25.2* 17.7* 11.0* 12.3* 10.6* 9.4 10.5  NEUTROABS 21.7* 16.1*  --   --   --   --   --   HGB 13.8 12.2 11.2* 11.5* 11.5* 10.9* 11.9*  HCT 38.7 35.1* 33.2* 33.6* 33.8* 31.8* 34.8*  MCV 87.0 87.3 88.5 88.2 86.7 87.6 87.9  PLT 328 276 269 247 218 248 025    Basic Metabolic Panel:  Recent Labs Lab 08/29/16 0404 08/30/16 0318 09/01/16 1123 09/02/16 0725 09/03/16 0338  NA 135  136 141 140 139 135  K 4.4  4.5 4.7 4.2 4.1 4.3  CL 111  111 112* 107 109 107  CO2 15*  15* 21* 29 24 21*  GLUCOSE 114*  115* 122* 91 96 103*  BUN 86*  88* 40* 16 12 9   CREATININE 4.99*  5.04* 1.53* 1.03* 0.93 0.83  CALCIUM 8.6*  8.7* 8.2* 8.3* 8.0* 8.1*  PHOS 4.2  --   --   --   --  GFR: Estimated Creatinine Clearance: 76.4 mL/min (by C-G formula based on SCr of 0.83 mg/dL).  Liver Function Tests:  Recent Labs Lab 08/28/16 1759 08/29/16 0404 09/01/16 1123  AST 9* 11* 17  ALT 11* 12* 17  ALKPHOS 89 77 74  BILITOT 0.3 0.5 0.2*  PROT 6.8 6.2* 5.8*  ALBUMIN 3.0* 2.7*  2.7* 2.8*     Recent Labs Lab 08/28/16 1759 09/01/16 2025  LIPASE 28 22  AMYLASE  --  210*    Coagulation Profile:  Recent Labs Lab 08/29/16 0404 09/01/16 1500  INR 1.22 0.92     Recent Results (from the past 240 hour(s))  Urine Culture     Status: Abnormal   Collection Time: 08/28/16  5:58 PM  Result Value Ref Range Status   Specimen Description URINE, RANDOM  Final   Special Requests NONE  Final   Culture MULTIPLE SPECIES  PRESENT, SUGGEST RECOLLECTION (A)  Final   Report Status 08/30/2016 FINAL  Final  Blood Culture (routine x 2)     Status: None (Preliminary result)   Collection Time: 08/28/16  6:07 PM  Result Value Ref Range Status   Specimen Description BLOOD RIGHT HAND  Final   Special Requests   Final    BOTTLES DRAWN AEROBIC AND ANAEROBIC Blood Culture adequate volume   Culture   Final    NO GROWTH 4 DAYS Performed at [facility], [place] [address], [place], [place] [address]    Report Status PENDING  Incomplete  MRSA PCR Screening     Status: None   Collection Time: 08/28/16 10:46 PM  Result Value Ref Range Status   MRSA by PCR NEGATIVE NEGATIVE Final    Comment:        The GeneXpert MRSA Assay (FDA approved for NASAL specimens only), is one component of a comprehensive MRSA colonization surveillance program. It is not intended to diagnose MRSA infection nor to guide or monitor treatment for MRSA infections.   Blood Culture (routine x 2)     Status: None (Preliminary result)   Collection Time: 08/29/16  4:04 AM  Result Value Ref Range Status   Specimen Description BLOOD LEFT ANTECUBITAL  Final   Special Requests IN PEDIATRIC BOTTLE Blood Culture adequate volume  Final   Culture   Final    NO GROWTH 4 DAYS Performed at [facility], [place] [address], [place], [place] [address]    Report Status PENDING  Incomplete  Urine Culture     Status: None   Collection Time: 09/01/16  1:18 PM  Result Value Ref Range Status   Specimen Description URINE, CATHETERIZED  Final   Special Requests NONE  Final   Culture   Final    NO GROWTH Performed at [facility], [place] [address] [place], [place] [address]    Report Status 09/02/2016 FINAL  Final      Radiology Studies: No results found.   Medications:  Scheduled: . cefpodoxime  200 mg Oral BID  . pantoprazole  40 mg Oral Daily  . polyethylene glycol-electrolytes  4,000 mL Oral Once   Continuous: . sodium chloride 100  mL/hr at 09/02/16 1924  . sodium chloride 20 mL/hr at 09/02/16 2351   EGB:[id number] **OR** acetaminophen  Assessment/Plan:  Principal Problem:   Hematochezia    Hematochezia/abdominal pain This is been ongoing for several weeks. Etiology is unclear. Patient has never had a colonoscopy. Hemoglobin has been stable. Gastroenterology was consulted. Plans for colonoscopy. Unfortunately patient could  not complete her prep last night. Now. Plan is to do colonoscopy tomorrow morning. Patient remains stable. Abdomen was benign on examination. Recent CT scan did not show any concerning findings. Amylase level noted to be elevated but lipase is normal.   Normocytic anemia. Mild drop in hemoglobin is likely dilutional. There could be an element of acute blood loss as well.  Recent sepsis. No clear source for sepsis was found during her previous hospitalization. She was initially on IV antibiotics and was discharged on cefpodoxime. Continue the same for now.  Chronic kidney disease stage III with recent acute renal failure. Renal function is back to baseline. However, she was noted to have a Foley catheter, which was left in place during her discharge. Reason for this is not entirely clear. She does have a history of urinary retention in the past. However, has not had any recent problems urinating. Foley catheter was placed during previous hospitalization, mainly for strict ins and outs, as patient was encephalopathic. Foley catheter was removed yesterday. She has had no difficulty urinating. Continue to monitor for now. CT scan done during previous hospitalization did not show any hydronephrosis. She has seen urology as outpatient for urinary complaints and she tells me that she was prescribed antibiotics when she saw them last.  Essential hypertension. Blood pressure medications are on hold for now. Blood pressure is reasonably well controlled. Continue to monitor.  History of pulmonary  embolism. Patient noted to be on Eliquis. PE was diagnosed in June for 2017 based on an indeterminate VQ scan. She has completed more than one year of treatment. We will wait and see what the colonoscopy shows. However, would recommend stopping anticoagulation. She can discuss this further with her primary care provider at follow-up.  History of rheumatoid arthritis. She apparently is on prednisone on a chronic basis. Patient confirmed that she has been on prednisone. Pharmacy to do medication reconciliation again.  History of COPD Stable. Continue home medications.  DVT Prophylaxis: SCDs    Code Status: Full code  Family Communication: Discussed with the patient  Disposition Plan: Colonoscopy tomorrow. Mobilize. Okay to ambulate.    LOS: 1 day   [facility] Pager [phone] 09/03/2016, 11:52 AM  If 7PM-7AM, please contact night-coverage at [email or web address], password [facility]

## 2016-09-04 ENCOUNTER — Encounter (HOSPITAL_COMMUNITY)
Admission: EM | Disposition: A | Discharge status: Home / Self Care | Payer: Self-pay | Source: Home / Self Care | Attending: Internal Medicine

## 2016-09-04 ENCOUNTER — Encounter (HOSPITAL_COMMUNITY): Payer: Self-pay

## 2016-09-04 DIAGNOSIS — K648 Other hemorrhoids: Secondary | ICD-10-CM

## 2016-09-04 DIAGNOSIS — K529 Noninfective gastroenteritis and colitis, unspecified: Secondary | ICD-10-CM

## 2016-09-04 HISTORY — PX: COLONOSCOPY: SHX5424

## 2016-09-04 LAB — CBC
HCT: 35.4 % — ABNORMAL LOW (ref 36.0–46.0)
HEMOGLOBIN: 11.9 g/dL — AB (ref 12.0–15.0)
MCH: 29.8 pg (ref 26.0–34.0)
MCHC: 33.6 g/dL (ref 30.0–36.0)
MCV: 88.7 fL (ref 78.0–100.0)
PLATELETS: 268 10*3/uL (ref 150–400)
RBC: 3.99 MIL/uL (ref 3.87–5.11)
RDW: 14 % (ref 11.5–15.5)
WBC: 8.5 10*3/uL (ref 4.0–10.5)

## 2016-09-04 SURGERY — COLONOSCOPY
Anesthesia: Moderate Sedation

## 2016-09-04 MED ORDER — OXYCODONE-ACETAMINOPHEN 5-325 MG PO TABS
2.0000 | ORAL_TABLET | Freq: Three times a day (TID) | ORAL | Status: DC | PRN
  Administered 2016-09-04: 2 via ORAL
  Filled 2016-09-04: qty 2

## 2016-09-04 MED ORDER — DIPHENHYDRAMINE HCL 50 MG/ML IJ SOLN
INTRAMUSCULAR | Status: DC | PRN
  Administered 2016-09-04: 25 mg via INTRAVENOUS

## 2016-09-04 MED ORDER — AMLODIPINE BESYLATE 10 MG PO TABS
10.0000 mg | ORAL_TABLET | Freq: Every day | ORAL | Status: DC
Start: 2016-09-04 — End: 2016-09-04

## 2016-09-04 MED ORDER — OXYCODONE-ACETAMINOPHEN 5-325 MG PO TABS
2.0000 | ORAL_TABLET | Freq: Once | ORAL | Status: AC
  Administered 2016-09-04: 2 via ORAL
  Filled 2016-09-04: qty 2

## 2016-09-04 MED ORDER — MIDAZOLAM HCL 5 MG/ML IJ SOLN
INTRAMUSCULAR | Status: AC
  Filled 2016-09-04: qty 3

## 2016-09-04 MED ORDER — FENTANYL CITRATE (PF) 100 MCG/2ML IJ SOLN
INTRAMUSCULAR | Status: DC | PRN
  Administered 2016-09-04 (×4): 25 ug via INTRAVENOUS

## 2016-09-04 MED ORDER — MIDAZOLAM HCL 5 MG/5ML IJ SOLN
INTRAMUSCULAR | Status: DC | PRN
  Administered 2016-09-04: 1 mg via INTRAVENOUS
  Administered 2016-09-04 (×2): 2 mg via INTRAVENOUS

## 2016-09-04 MED ORDER — DIPHENHYDRAMINE HCL 50 MG/ML IJ SOLN
INTRAMUSCULAR | Status: AC
  Filled 2016-09-04: qty 1

## 2016-09-04 MED ORDER — FENTANYL CITRATE (PF) 100 MCG/2ML IJ SOLN
INTRAMUSCULAR | Status: AC
  Filled 2016-09-04: qty 4

## 2016-09-04 NOTE — H&P (View-Only) (Signed)
Subjective: She was not able to finish the prep.  Objective: Vital signs in last 24 hours: Temp:  [98.7 F (37.1 C)-99.4 F (37.4 C)] 98.9 F (37.2 C) (08/24 0450) Pulse Rate:  [77-104] 79 (08/24 0450) Resp:  [16-18] 16 (08/24 0450) BP: (116-145)/(79-101) 142/79 (08/24 0450) SpO2:  [97 %-99 %] 97 % (08/24 0450) Last BM Date: 08/28/16  Intake/Output from previous day: 08/23 0701 - 08/24 0700 In: 400 [P.O.:400] Out: 850 [Urine:850] Intake/Output this shift: No intake/output data recorded.  General appearance: alert and no distress GI: soft, non-tender; bowel sounds normal; no masses,  no organomegaly  Lab Results:  Recent Labs  09/01/16 2025 09/02/16 0725 09/03/16 0338  WBC 10.6* 9.4 10.5  HGB 11.5* 10.9* 11.9*  HCT 33.8* 31.8* 34.8*  PLT 218 248 272   BMET  Recent Labs  09/01/16 1123 09/02/16 0725 09/03/16 0338  NA 140 139 135  K 4.2 4.1 4.3  CL 107 109 107  CO2 29 24 21*  GLUCOSE 91 96 103*  BUN 16 12 9   CREATININE 1.03* 0.93 0.83  CALCIUM 8.3* 8.0* 8.1*   LFT  Recent Labs  09/01/16 1123  PROT 5.8*  ALBUMIN 2.8*  AST 17  ALT 17  ALKPHOS 74  BILITOT 0.2*   PT/INR  Recent Labs  09/01/16 1500  LABPROT 12.4  INR 0.92   Hepatitis Panel No results for input(s): HEPBSAG, HCVAB, HEPAIGM, HEPBIGM in the last 72 hours. C-Diff No results for input(s): CDIFFTOX in the last 72 hours. Fecal Lactopherrin No results for input(s): FECLLACTOFRN in the last 72 hours.  Studies/Results: No results found.  Medications:  Scheduled: . cefpodoxime  200 mg Oral BID  . pantoprazole  40 mg Oral Daily   Continuous: . sodium chloride 100 mL/hr at 09/02/16 1924  . sodium chloride 20 mL/hr at 09/02/16 2351    Assessment/Plan: 1) Hematochezia. 2) Anemia.   Nursing reported that she refused to finish the prep.  She states that she will try today as she "wants to know" about her bleeding issue.  Plan: 1) Prep for colonoscopy tomorrow.  LOS: 1 day    Win Guajardo D 09/03/2016, 10:43 AM

## 2016-09-04 NOTE — Care Management Note (Signed)
Case Management Note  Patient Details  Name: [name] MRN: [id number] Date of Birth: 1955/07/10  Subjective/Objective:                 Spoke w patient about HH needs. She states that she has a Environmental health practitioner that checks on her once a week and she is not interested in any additional [place] at this time. She was instructed to let her nurse know if her needs increase so additional help can be arranged. No other CM needs identified.    Action/Plan:  DC to home Expected Discharge Date:  09/04/16               Expected Discharge Plan:  Home/Self Care  In-House Referral:     Discharge planning Services  CM Consult  Post Acute Care Choice:    Choice offered to:     DME Arranged:    DME Agency:     HH Arranged:    HH Agency:     Status of Service:  Completed, signed off  If discussed at [employer] of Stay Meetings, dates discussed:    Additional Comments:  Carles Collet, RN 09/04/2016, 12:53 PM

## 2016-09-04 NOTE — Op Note (Signed)
[facility] Patient Name: [name] Procedure Date: 09/04/2016 MRN: [id number] Attending MD: [name] , MD Date of Birth: [birth year] CSN: [id number] Age: 61 Admit Type: Inpatient Procedure:                Colonoscopy Indications:              Hematochezia Providers:                [name], MD, Laverta Baltimore RN, RN,                            Elspeth Cho Tech., Technician Referring MD:             covering for Dr. Benson [place] this weekend. Medicines:                Fentanyl 100 micrograms IV, Midazolam 5 mg IV,                            Diphenhydramine 25 mg IV Complications:            No immediate complications. Estimated blood loss:                            None. Estimated Blood Loss:     Estimated blood loss: none. Procedure:                Pre-Anesthesia Assessment:                           - Prior to the procedure, a History and Physical                            was performed, and patient medications and                            allergies were reviewed. The patient's tolerance of                            previous anesthesia was also reviewed. The risks                            and benefits of the procedure and the sedation                            options and risks were discussed with the patient.                            All questions were answered, and informed consent                            was obtained. Prior Anticoagulants: The patient has                            taken Eliquis (apixaban), last dose was 14 days  prior to procedure. ASA Grade Assessment: III - A                            patient with severe systemic disease. After                            reviewing the risks and benefits, the patient was                            deemed in satisfactory condition to undergo the                            procedure.                           After obtaining informed consent, the  colonoscope                            was passed under direct vision. Throughout the                            procedure, the patient's blood pressure, pulse, and                            oxygen saturations were monitored continuously. The                            EC-[id number] ([id number]) scope was introduced through                            the anus and advanced to the the cecum, identified                            by appendiceal orifice and ileocecal valve. The                            colonoscopy was performed without difficulty. The                            patient tolerated the procedure well. The quality                            of the bowel preparation was good. The ileocecal                            valve, appendiceal orifice, and rectum were                            photographed. Scope In: 8:42:05 AM Scope Out: 9:02:19 AM Scope Withdrawal Time: 0 hours 6 minutes 1 second  Total Procedure Duration: 0 hours 20 minutes 14 seconds  Findings:      The mucosa in the sigmoid colon was abnormal for about 15-20cm. In this       segment there were patchy areas of erythema and  a few small, shallow       ulcers and the mucosa was edematous. Biopsies were taken with a cold       forceps for histology.      I could not intubate the terminal ileum, no apparent stricture or       inflammation externally however.      Internal hemorrhoids were found. The hemorrhoids were small.      The exam was otherwise without abnormality on direct and retroflexion       views. Impression:               - Sigmoid colon with patchy inflammation,                            ulceration. 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 Her                            WBC was elevated at admit, she's had minor abd                            pains and rectal bleeding, and tox screen was                            positive for cocaine. I  don't think antibiotics are                            necessary at this point since WBC has normalized,                            bleeding has been minimal. IBD, infection seem less                            likely as cause. Moderate Sedation:      Moderate (conscious) sedation was administered by the endoscopy nurse       and supervised by the endoscopist. The following parameters were       monitored: oxygen saturation, heart rate, blood pressure, and response       to care. Total physician intraservice time was 20 minutes. Recommendation:           - Return patient to hospital ward for possible                            discharge same day. She should not resume cocaine                            since it may have caused this problem.                           - Advance diet as tolerated.                           - Continue present medications.                           -  I will contact her with final path results. Procedure Code(s):        --- Professional ---                           [phone], Colonoscopy, flexible; with biopsy, single                            or multiple                           99152, Moderate sedation services provided by the                            same physician or other qualified health care                            professional performing the diagnostic or                            therapeutic service that the sedation supports,                            requiring the presence of an independent trained                            observer to assist in the monitoring of the                            patient's level of consciousness and physiological                            status; initial 15 minutes of intraservice time,                            patient age 4 years or older Diagnosis Code(s):        --- Professional ---                           K52.9, Noninfective gastroenteritis and colitis,                            unspecified                            K64.8, Other hemorrhoids                           K92.1, Melena (includes Hematochezia) CPT copyright 2016 [employer]. All rights reserved. The codes documented in this report are preliminary and upon coder review may  be revised to meet current compliance requirements. [name], MD 09/04/2016 9:20:13 AM This report has been signed electronically. Number of Addenda: 0
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
## 2016-09-04 NOTE — Interval H&P Note (Signed)
History and Physical Interval Note:  09/04/2016 8:28 AM  [name]  has presented today for surgery, with the diagnosis of Hematochezia  The various methods of treatment have been discussed with the patient and family. After consideration of risks, benefits and other options for treatment, the patient has consented to  Procedure(s): COLONOSCOPY (N/A) as a surgical intervention .  The patient's history has been reviewed, patient examined, no change in status, stable for surgery.  I have reviewed the patient's chart and labs.  Questions were answered to the patient's satisfaction.     [name]

## 2016-09-04 NOTE — Progress Notes (Signed)
Pt has been d/c since am but refuses offer for cab voucher. Her family has not picked her up from unit. Pt stated she had some pain; paged DR, admin pain med. Returned meds stored in Rx; Pt has received & signed d/c paper.

## 2016-09-06 ENCOUNTER — Encounter (HOSPITAL_COMMUNITY): Payer: Self-pay | Admitting: Gastroenterology

## 2016-10-03 ENCOUNTER — Emergency Department (HOSPITAL_COMMUNITY): Payer: Medicaid Other

## 2016-10-03 ENCOUNTER — Emergency Department (HOSPITAL_COMMUNITY)
Admission: EM | Admit: 2016-10-03 | Discharge: 2016-10-04 | Disposition: A | Discharge status: Home / Self Care | Payer: Medicaid Other | Attending: Emergency Medicine | Admitting: Emergency Medicine

## 2016-10-03 ENCOUNTER — Encounter (HOSPITAL_COMMUNITY): Payer: Self-pay | Admitting: *Deleted

## 2016-10-03 DIAGNOSIS — M79675 Pain in left toe(s): Secondary | ICD-10-CM | POA: Insufficient documentation

## 2016-10-03 DIAGNOSIS — J449 Chronic obstructive pulmonary disease, unspecified: Secondary | ICD-10-CM | POA: Insufficient documentation

## 2016-10-03 DIAGNOSIS — N183 Chronic kidney disease, stage 3 (moderate): Secondary | ICD-10-CM | POA: Diagnosis not present

## 2016-10-03 DIAGNOSIS — Z87891 Personal history of nicotine dependence: Secondary | ICD-10-CM | POA: Insufficient documentation

## 2016-10-03 DIAGNOSIS — I129 Hypertensive chronic kidney disease with stage 1 through stage 4 chronic kidney disease, or unspecified chronic kidney disease: Secondary | ICD-10-CM | POA: Diagnosis not present

## 2016-10-03 DIAGNOSIS — M25561 Pain in right knee: Secondary | ICD-10-CM | POA: Diagnosis not present

## 2016-10-03 DIAGNOSIS — Y999 Unspecified external cause status: Secondary | ICD-10-CM | POA: Insufficient documentation

## 2016-10-03 DIAGNOSIS — R52 Pain, unspecified: Secondary | ICD-10-CM

## 2016-10-03 DIAGNOSIS — Y9301 Activity, walking, marching and hiking: Secondary | ICD-10-CM | POA: Diagnosis not present

## 2016-10-03 DIAGNOSIS — S8261XA Displaced fracture of lateral malleolus of right fibula, initial encounter for closed fracture: Secondary | ICD-10-CM | POA: Insufficient documentation

## 2016-10-03 DIAGNOSIS — W19XXXA Unspecified fall, initial encounter: Secondary | ICD-10-CM

## 2016-10-03 DIAGNOSIS — Y9289 Other specified places as the place of occurrence of the external cause: Secondary | ICD-10-CM | POA: Insufficient documentation

## 2016-10-03 DIAGNOSIS — M25461 Effusion, right knee: Secondary | ICD-10-CM | POA: Insufficient documentation

## 2016-10-03 DIAGNOSIS — W109XXA Fall (on) (from) unspecified stairs and steps, initial encounter: Secondary | ICD-10-CM | POA: Insufficient documentation

## 2016-10-03 DIAGNOSIS — Z79899 Other long term (current) drug therapy: Secondary | ICD-10-CM | POA: Diagnosis not present

## 2016-10-03 DIAGNOSIS — S99911A Unspecified injury of right ankle, initial encounter: Secondary | ICD-10-CM | POA: Diagnosis present

## 2016-10-03 MED ORDER — KETOROLAC TROMETHAMINE 30 MG/ML IJ SOLN: 30.0000 mg | Freq: Once | INTRAMUSCULAR | Status: DC

## 2016-10-03 MED ORDER — OXYCODONE-ACETAMINOPHEN 5-325 MG PO TABS
1.0000 | ORAL_TABLET | Freq: Once | ORAL | Status: AC
  Administered 2016-10-03: 1 via ORAL
  Filled 2016-10-03: qty 1
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
## 2016-10-03 NOTE — ED Triage Notes (Signed)
Pt reports falling down stairs today and hurting her right ankle.  Pt denies LOC or hitting her head.  Right ankle swollen.

## 2016-10-04 MED ORDER — ACETAMINOPHEN 500 MG PO TABS: 500.0000 mg | ORAL_TABLET | Freq: Four times a day (QID) | ORAL | 0 refills | Status: DC | PRN

## 2016-10-04 NOTE — Discharge Instructions (Signed)
Take [id number] mg of Tylenol every 6 hours for pain.  Do not exceed 4000 g of Tylenol daily. Use crutches to ambulate, do not bear weight on the right leg. Do not travel with the crutches for a long distance. Be sure to take frequent breaks and sit down when you can. Follow up with orthopedics for re-evaluation of your symptoms. Return to the ED if any concerning signs or symptoms develop, such as swelling of the lower leg, severe pain, loss of pulses, or fevers.

## 2016-10-13 ENCOUNTER — Telehealth (INDEPENDENT_AMBULATORY_CARE_PROVIDER_SITE_OTHER): Payer: Self-pay | Admitting: Orthopaedic Surgery

## 2016-10-18 ENCOUNTER — Emergency Department (HOSPITAL_COMMUNITY)
Admission: EM | Admit: 2016-10-18 | Discharge: 2016-10-18 | Disposition: A | Discharge status: Home / Self Care | Payer: Medicaid Other | Attending: Emergency Medicine | Admitting: Emergency Medicine

## 2016-10-18 ENCOUNTER — Encounter (HOSPITAL_COMMUNITY): Payer: Self-pay | Admitting: *Deleted

## 2016-10-18 DIAGNOSIS — N183 Chronic kidney disease, stage 3 (moderate): Secondary | ICD-10-CM | POA: Diagnosis not present

## 2016-10-18 DIAGNOSIS — Y929 Unspecified place or not applicable: Secondary | ICD-10-CM | POA: Insufficient documentation

## 2016-10-18 DIAGNOSIS — W19XXXS Unspecified fall, sequela: Secondary | ICD-10-CM | POA: Diagnosis not present

## 2016-10-18 DIAGNOSIS — Y939 Activity, unspecified: Secondary | ICD-10-CM | POA: Diagnosis not present

## 2016-10-18 DIAGNOSIS — Z79899 Other long term (current) drug therapy: Secondary | ICD-10-CM | POA: Diagnosis not present

## 2016-10-18 DIAGNOSIS — I129 Hypertensive chronic kidney disease with stage 1 through stage 4 chronic kidney disease, or unspecified chronic kidney disease: Secondary | ICD-10-CM | POA: Diagnosis not present

## 2016-10-18 DIAGNOSIS — S8262XS Displaced fracture of lateral malleolus of left fibula, sequela: Secondary | ICD-10-CM | POA: Diagnosis not present

## 2016-10-18 DIAGNOSIS — Y999 Unspecified external cause status: Secondary | ICD-10-CM | POA: Insufficient documentation

## 2016-10-18 DIAGNOSIS — J449 Chronic obstructive pulmonary disease, unspecified: Secondary | ICD-10-CM | POA: Diagnosis not present

## 2016-10-18 DIAGNOSIS — S8261XS Displaced fracture of lateral malleolus of right fibula, sequela: Secondary | ICD-10-CM

## 2016-10-18 MED ORDER — OXYCODONE-ACETAMINOPHEN 5-325 MG PO TABS: 1.0000 | ORAL_TABLET | Freq: Three times a day (TID) | ORAL | 0 refills | Status: DC | PRN

## 2016-10-18 MED ORDER — OXYCODONE-ACETAMINOPHEN 5-325 MG PO TABS
1.0000 | ORAL_TABLET | Freq: Once | ORAL | Status: AC
  Administered 2016-10-18: 1 via ORAL
  Filled 2016-10-18: qty 1

## 2016-10-18 NOTE — ED Triage Notes (Signed)
Pt complains of right ankle pain d/t lateral malleolus fracture on 9/23. Pt was seen in in ED at the time and had short leg splint placed. Pt states she has appointment to the  the orthopedic doctor she was referred to, but it is on 10/18. Pt would like to have her splint rewrapped, because the splint is now loose because she removed the gauze. Pt would also like a prescription for pain medication.

## 2016-10-18 NOTE — Discharge Instructions (Signed)
Keep your scheduled appointment with the orthopedist.   Tylenol as needed for pain. Because your primary care doctor is managing your pain, you will need to schedule an appointment with him for further pain management.   Return to ER for new or worsening symptoms, any additional concerns.
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
## 2016-10-19 IMAGING — CR DG CHEST 2V
2 series · 2 of 2 positions shown · non-contrast
Comparison: 02/14/2015.  09/09/2014.

CLINICAL DATA: History smoking. Hypertension. Cervical spine
surgery.

EXAM:
CHEST  2 VIEW

[w chest pa]
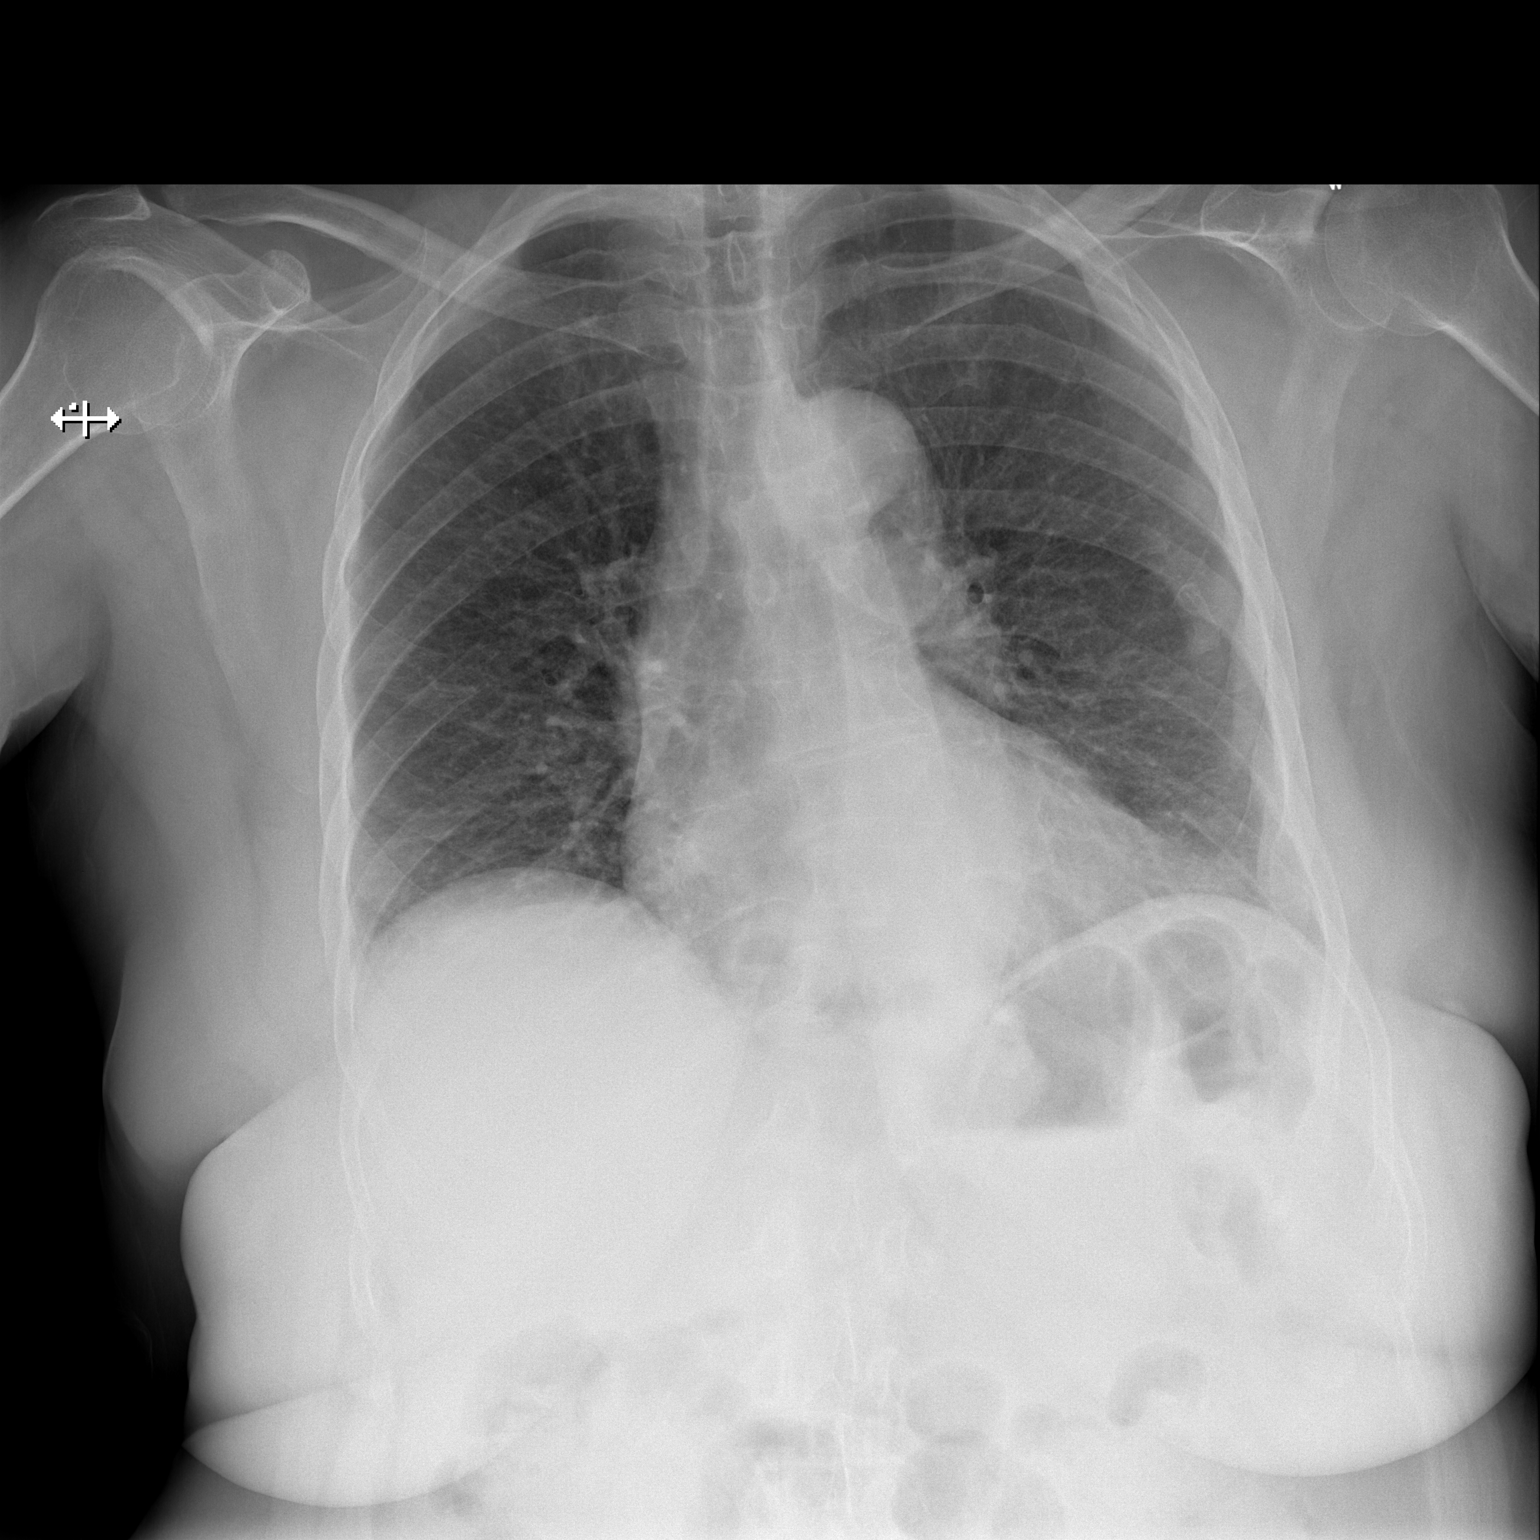

[w chest lat]
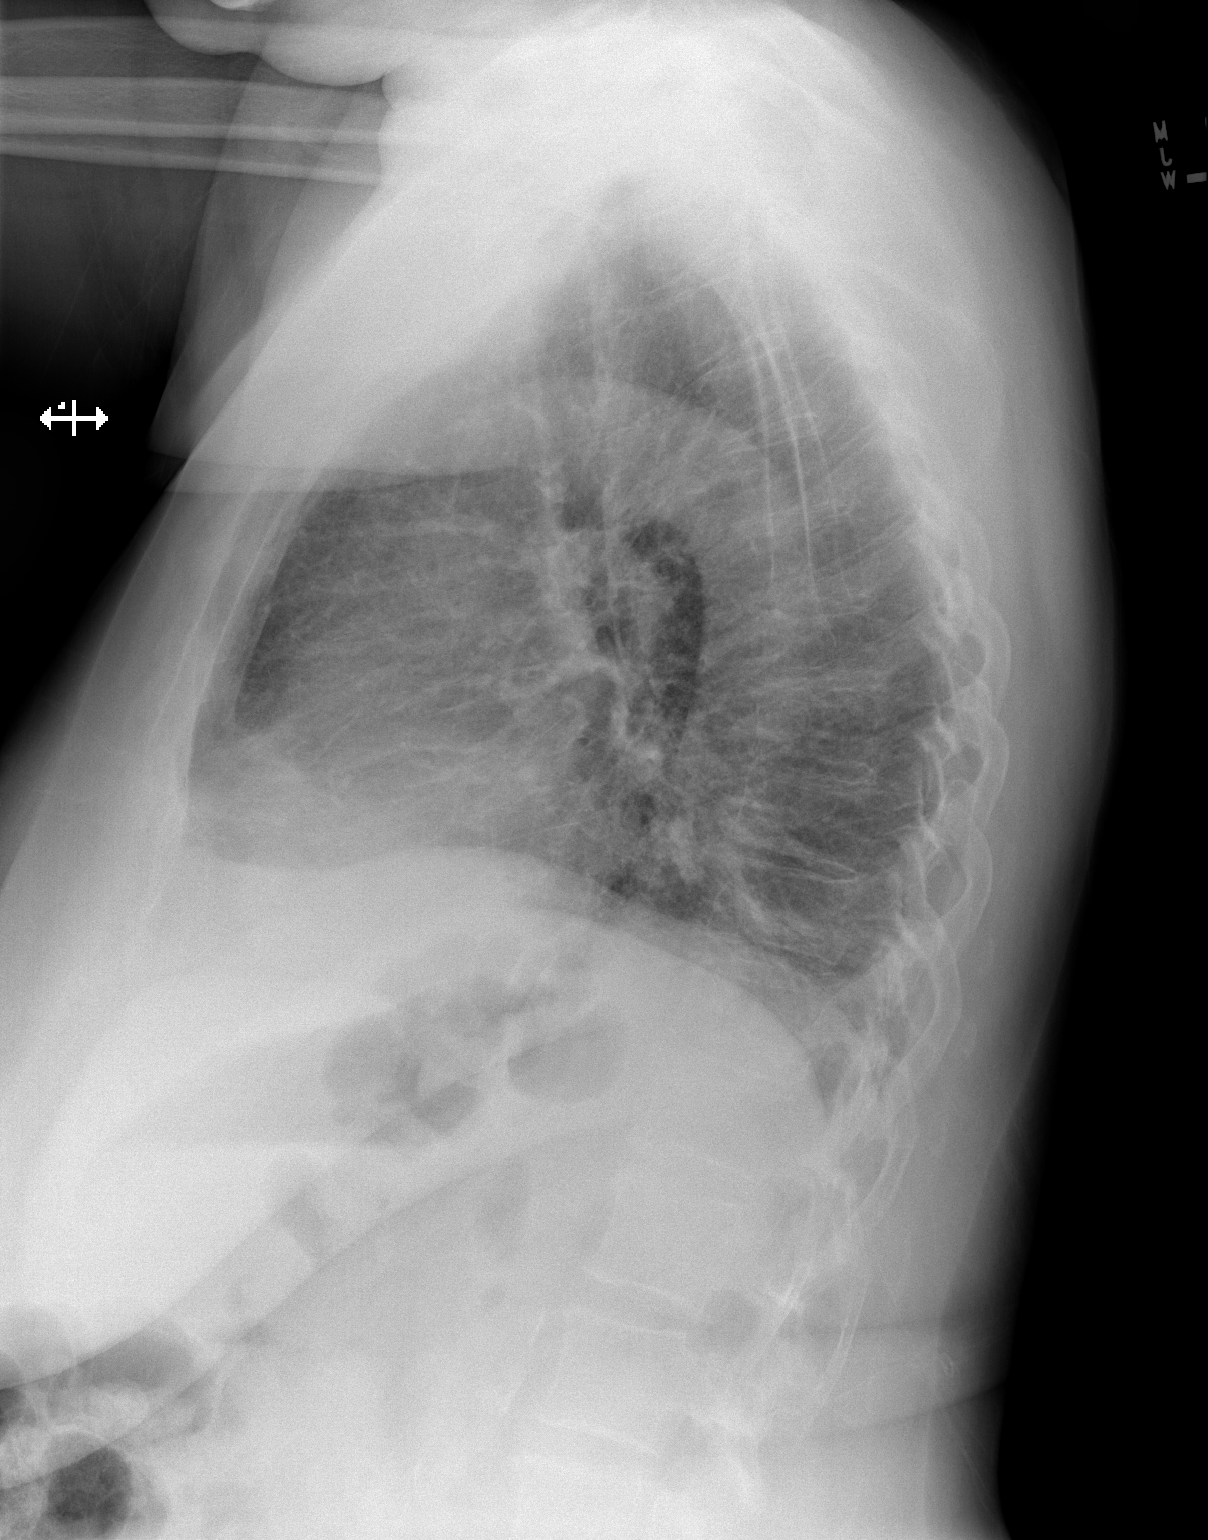

[2 of 2 positions shown; findings below may reference images not displayed]

FINDINGS: Mediastinum and hilar structures are normal. Questionable density is
in the left mid lung. This may represent deformity of the left
anterior fourth rib lesion such as healed fracture or underlying
pulmonary lesion. Repeat PA and lateral chest x-ray and left rib
series suggested for further evaluation. No acute infiltrate.
Chronic interstitial prominence. No pleural effusion pneumothorax.
Heart size stable. No acute bony abnormality identified.
IMPRESSION: 1. Density noted projected over the left mid chest at the level of
the anterior fourth rib. This could represent a a left anterior
fourth rib lesion such as healed fracture or underlying pulmonary
lesion. Repeat PA and lateral chest x-ray and left rib series
suggest for further evaluation.

2.  Stable cardiomegaly.  Chronic interstitial lung disease.

## 2016-10-25 ENCOUNTER — Ambulatory Visit (INDEPENDENT_AMBULATORY_CARE_PROVIDER_SITE_OTHER): Payer: Self-pay | Admitting: Orthopaedic Surgery

## 2016-10-27 IMAGING — RF DG CERVICAL SPINE 1V
1 series · 2 of 2 positions shown · non-contrast
Comparison: 02/24/2015

CLINICAL DATA: C5-C7 ACDF

EXAM:
DG C-ARM 61-120 MIN; DG CERVICAL SPINE - 1 VIEW

[Series 1: run · 2 of 2 slices shown]
[im 1/2]
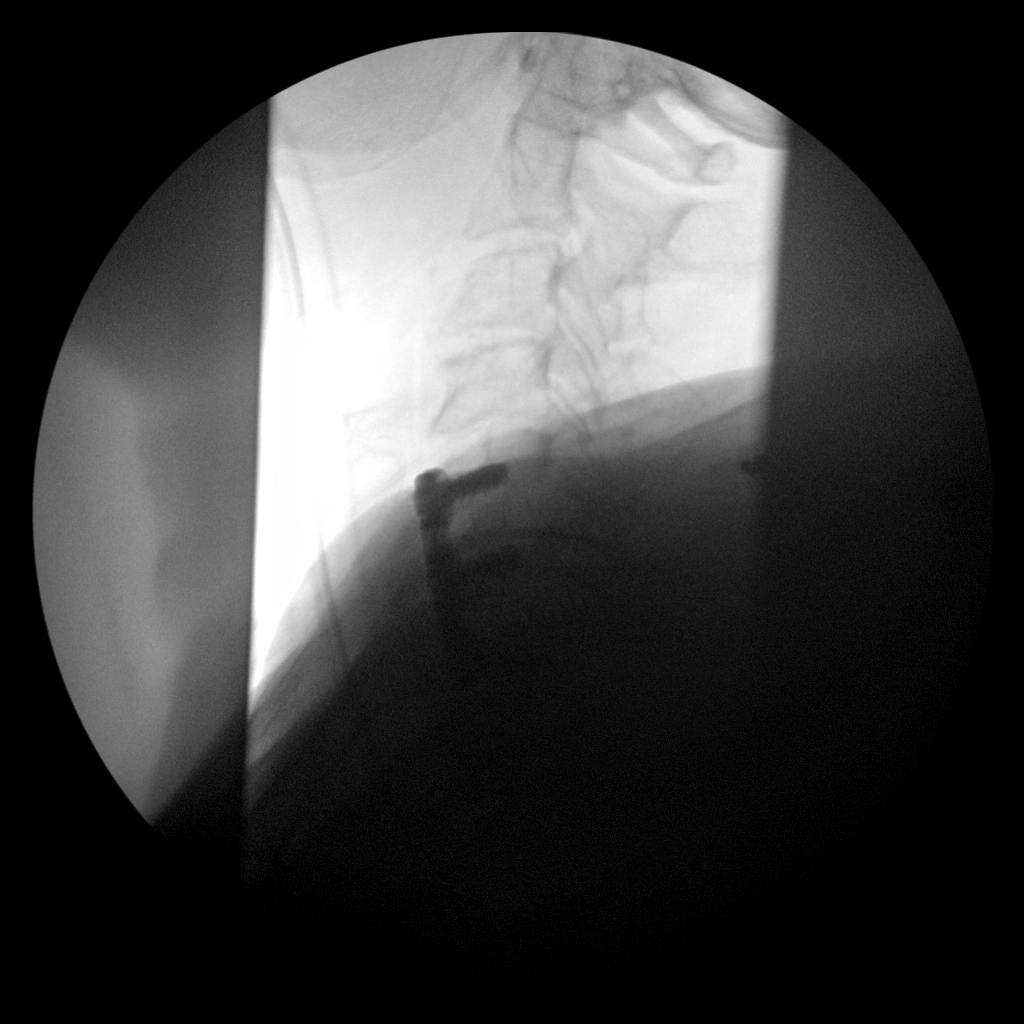
[im 2/2]
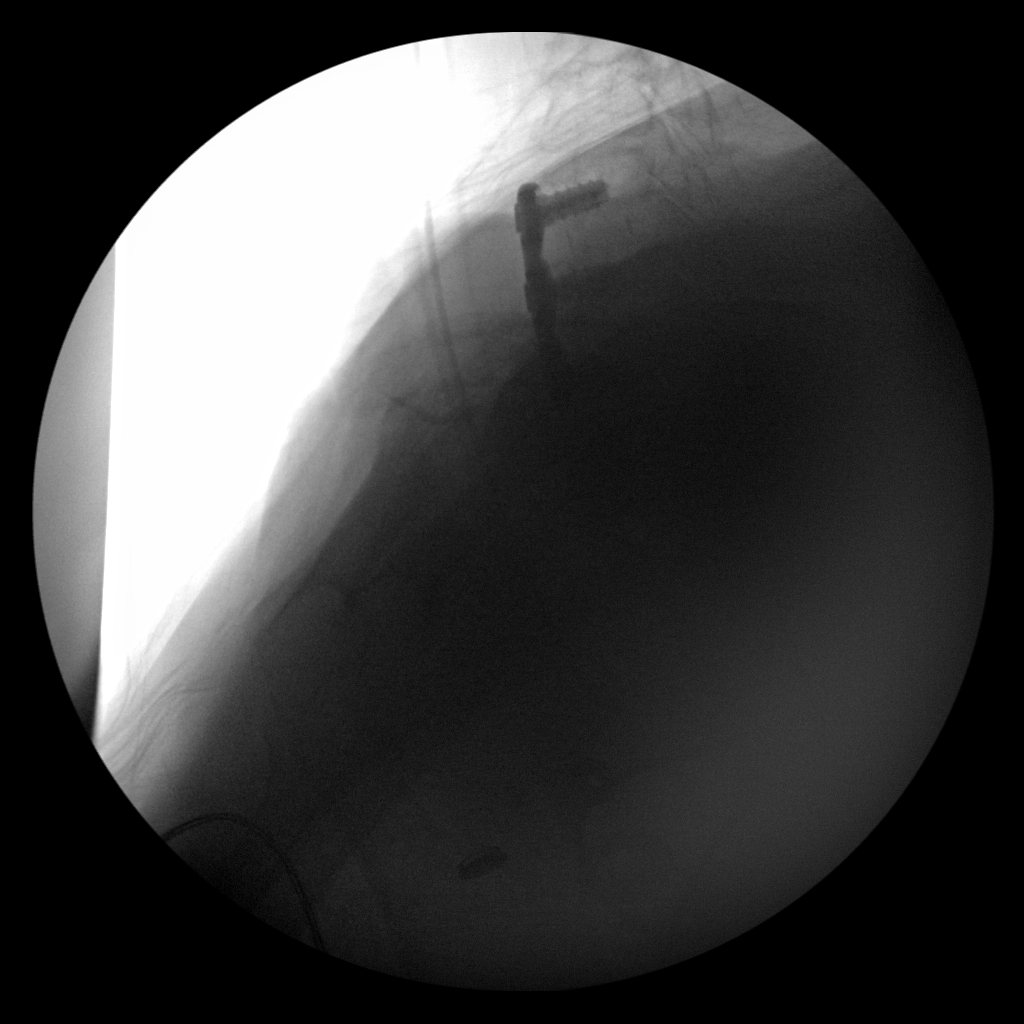

[2 of 2 positions shown; findings below may reference images not displayed]

FINDINGS: There has been anterior cervical discectomy and fusion from C5-C7.
There is anterior plate with screw fixation. Interbody fusion
material is in place headaches level. Detail is limited because of
shoulder density.
IMPRESSION: ACDF C5-C7.

## 2016-10-28 ENCOUNTER — Ambulatory Visit (INDEPENDENT_AMBULATORY_CARE_PROVIDER_SITE_OTHER): Payer: Medicaid Other

## 2016-10-28 ENCOUNTER — Ambulatory Visit (INDEPENDENT_AMBULATORY_CARE_PROVIDER_SITE_OTHER): Payer: Medicaid Other | Admitting: Orthopaedic Surgery

## 2016-10-28 DIAGNOSIS — M79675 Pain in left toe(s): Secondary | ICD-10-CM | POA: Diagnosis not present

## 2016-10-28 DIAGNOSIS — S8264XA Nondisplaced fracture of lateral malleolus of right fibula, initial encounter for closed fracture: Secondary | ICD-10-CM | POA: Insufficient documentation

## 2016-10-28 DIAGNOSIS — M25571 Pain in right ankle and joints of right foot: Secondary | ICD-10-CM

## 2016-10-28 MED ORDER — HYDROCODONE-ACETAMINOPHEN 5-325 MG PO TABS: 1.0000 | ORAL_TABLET | Freq: Four times a day (QID) | ORAL | 0 refills | Status: DC | PRN
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
## 2016-11-01 ENCOUNTER — Other Ambulatory Visit: Payer: Self-pay | Admitting: Neurological Surgery

## 2016-11-03 ENCOUNTER — Telehealth: Payer: Self-pay | Admitting: *Deleted

## 2016-11-03 NOTE — Telephone Encounter (Signed)
    Chart reviewed as part of pre-operative protocol coverage. Because of Hailynn Guggenheim's past medical history and time since last visit, he/she will require a follow-up visit in order to better assess preoperative cardiovascular risk. Last OV was in 2016.   Pre-op covering staff: - Please schedule appointment and call patient to inform them. - Please contact requesting surgeon's office via preferred method (i.e, phone, fax) to inform them of need for appointment prior to surgery.  Lyda Jester, PA-C  11/03/2016, 2:52 PM

## 2016-11-03 NOTE — Telephone Encounter (Signed)
Pt scheduled to [facility] on November 23 2016 for pre op clearance and follow up. Called and spoke with Lorriane Shire at [facility] who is also aware of patients appointment and that if any subsequent testing needs to happen after that OV the patients procedure will need to be postponed. She is agreeable and notated in with them to in patients chart. I called to make patient aware of appointment. She is agreeable.

## 2016-11-03 NOTE — Telephone Encounter (Signed)
   [facility] Pre-operative Risk Assessment    Request for surgical clearance:  1. What type of surgery is being performed? C3-C4 - C4-C5 Posterior Cervical Fusion with lateral mass fixation, C3-4 Cervical Laminectomy   2. When is this surgery scheduled? 11.15.2018   3. Are there any medications that need to be held prior to surgery and how long? :Cardiac Clearance: No medications to hold at this time.  4. Practice name and name of physician performing surgery?[facility]. Dr. Ronnald Ramp  5. What is your office phone and fax number? [place]: [phone] [place]: [phone]  8. Anesthesia type (None, local, MAC, general) ? General Anesthesia   [place], [name] 11/03/2016, 1:42 PM  _________________________________________________________________   (provider comments below)
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
## 2016-11-21 IMAGING — CR DG ANKLE 2V *R*
2 series · 2 of 2 positions shown · non-contrast
Comparison: 05/12/2015 at 0041 hours

CLINICAL DATA: Right ankle fracture after splinting

EXAM:
RIGHT ANKLE - 2 VIEW

[AP]
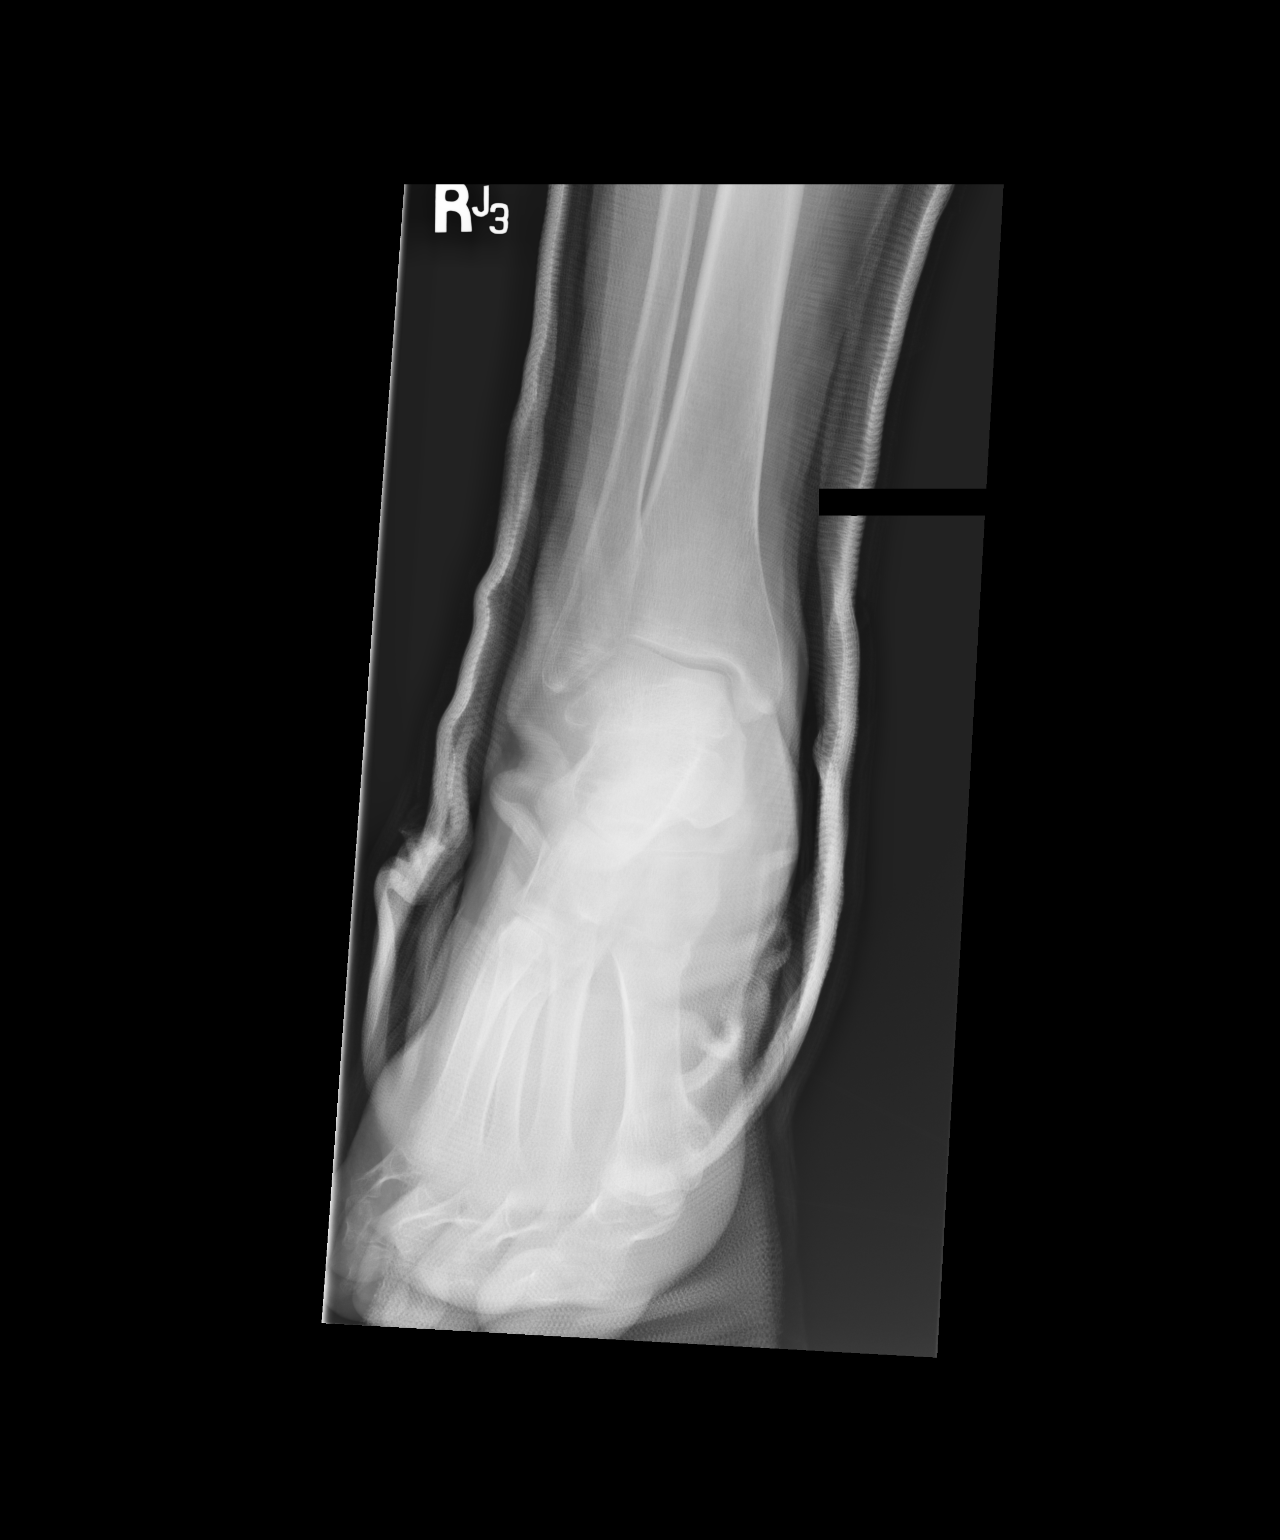

[lateral]
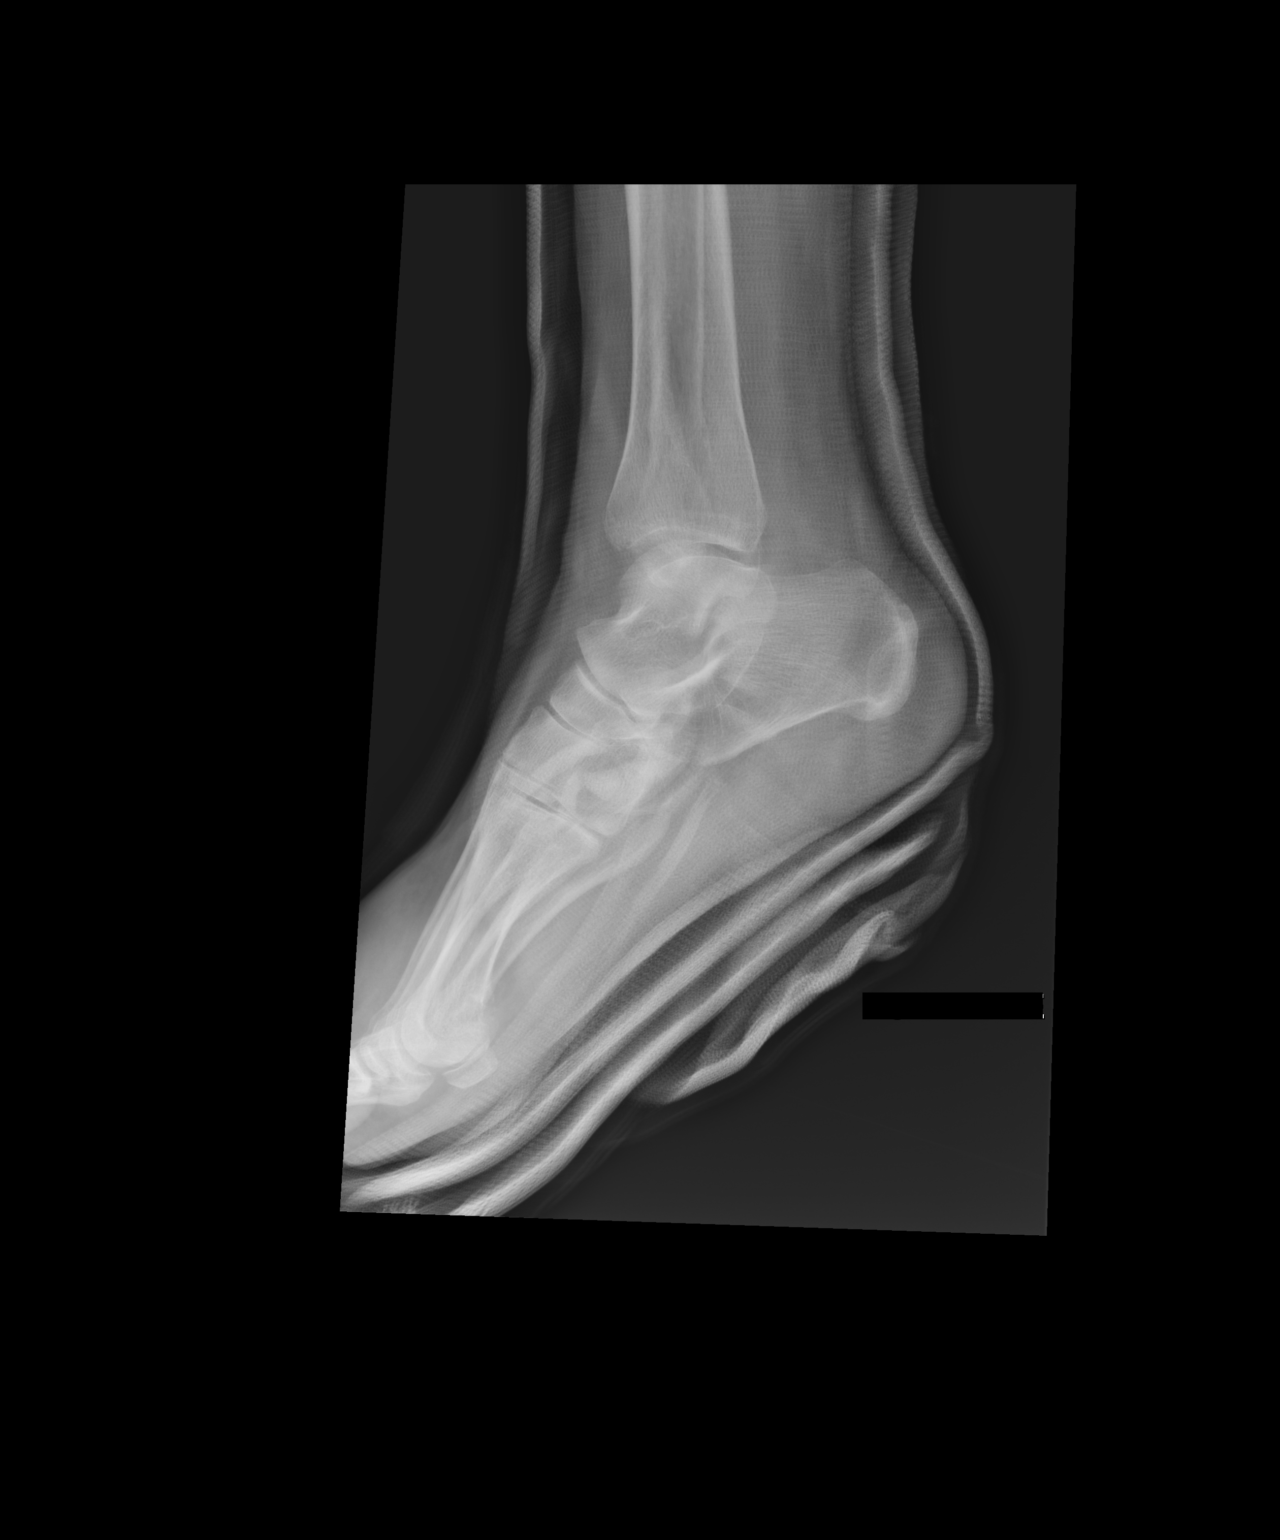

[2 of 2 positions shown; findings below may reference images not displayed]

FINDINGS: Cast obscures bony detail. Near anatomic position and alignment
involving fracture distal fibula. Suspected avulsion fracture
lateral calcaneus not visible on the current study.
IMPRESSION: No significant displacement involving distal fibula fracture.

## 2016-11-21 IMAGING — CR DG ANKLE COMPLETE 3+V*R*
3 series · 3 of 3 positions shown · non-contrast
Comparison: None.

CLINICAL DATA: Lateral right ankle pain after a fall 6 hours ago.

EXAM:
RIGHT ANKLE - COMPLETE 3+ VIEW

[x ankle ap right]
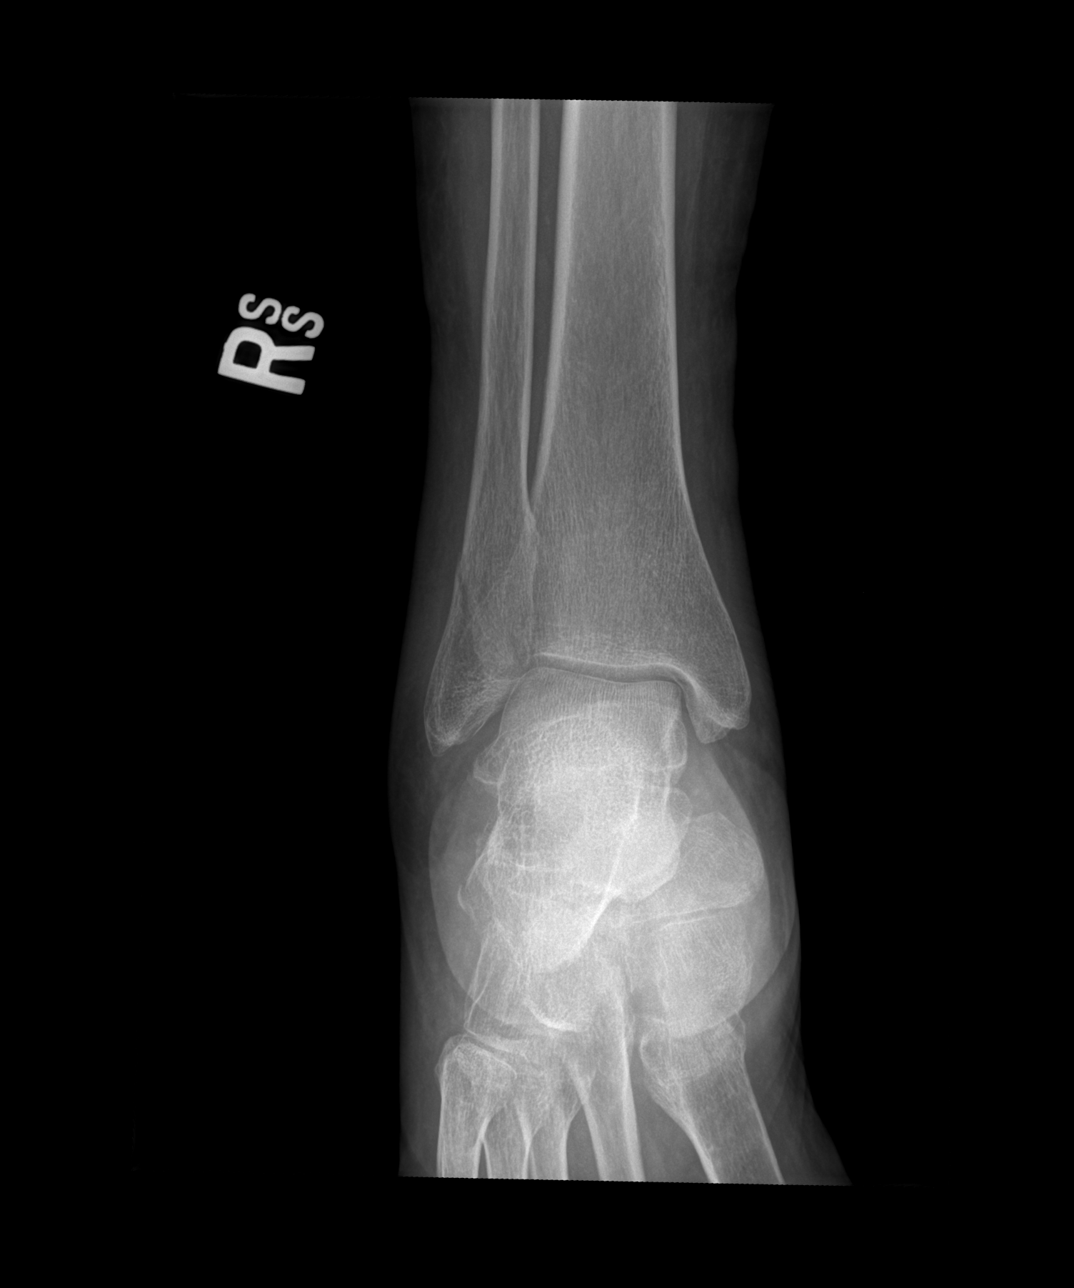

[x ankle obl right]
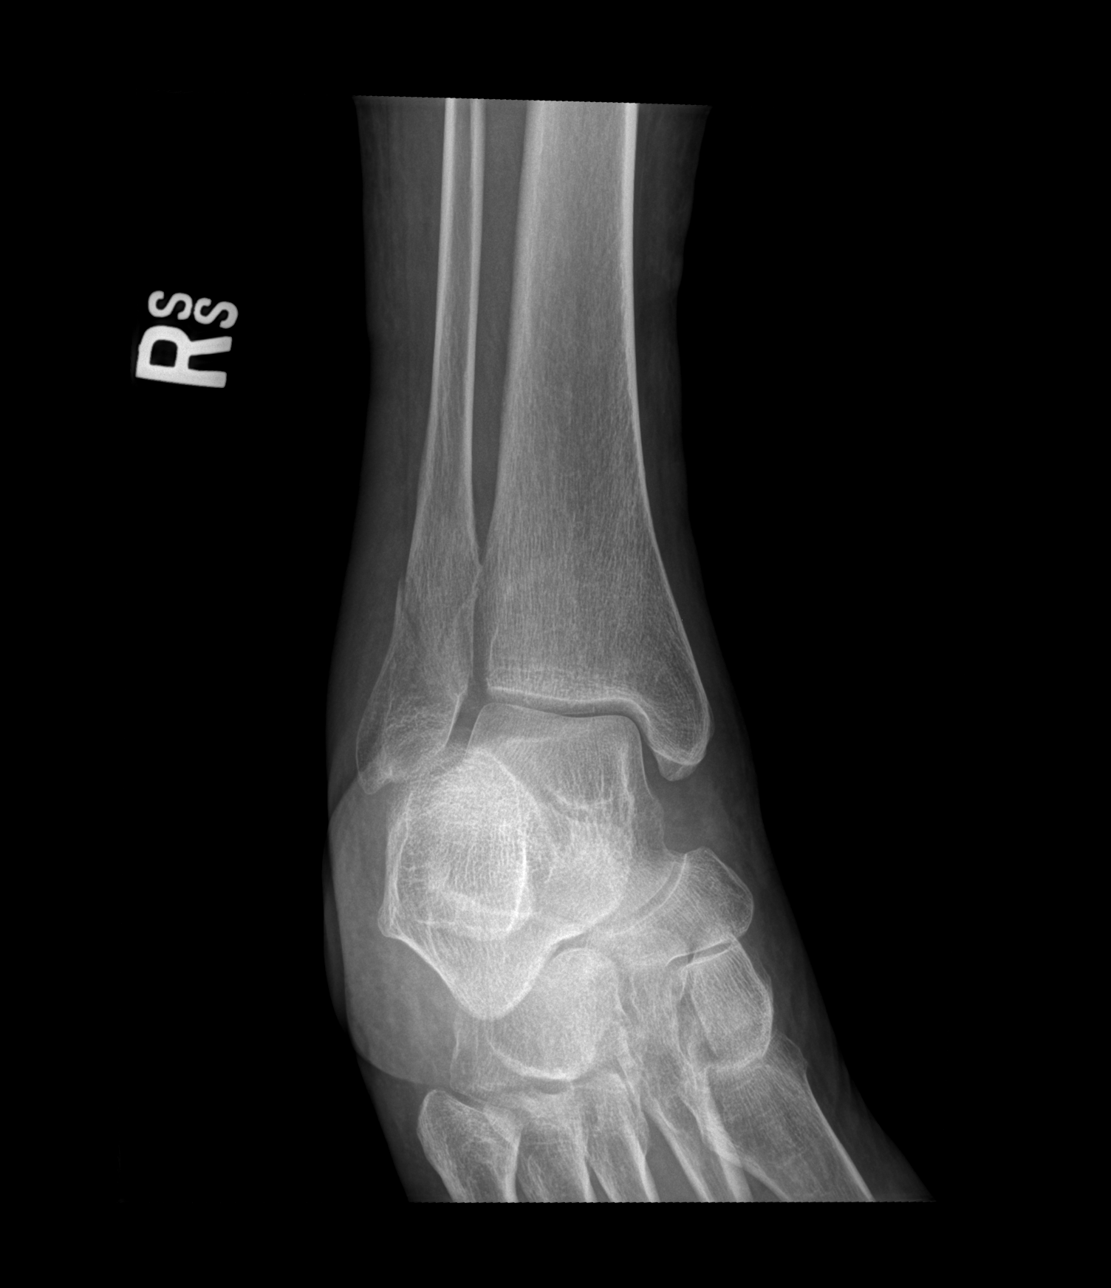

[x ankle lat right]
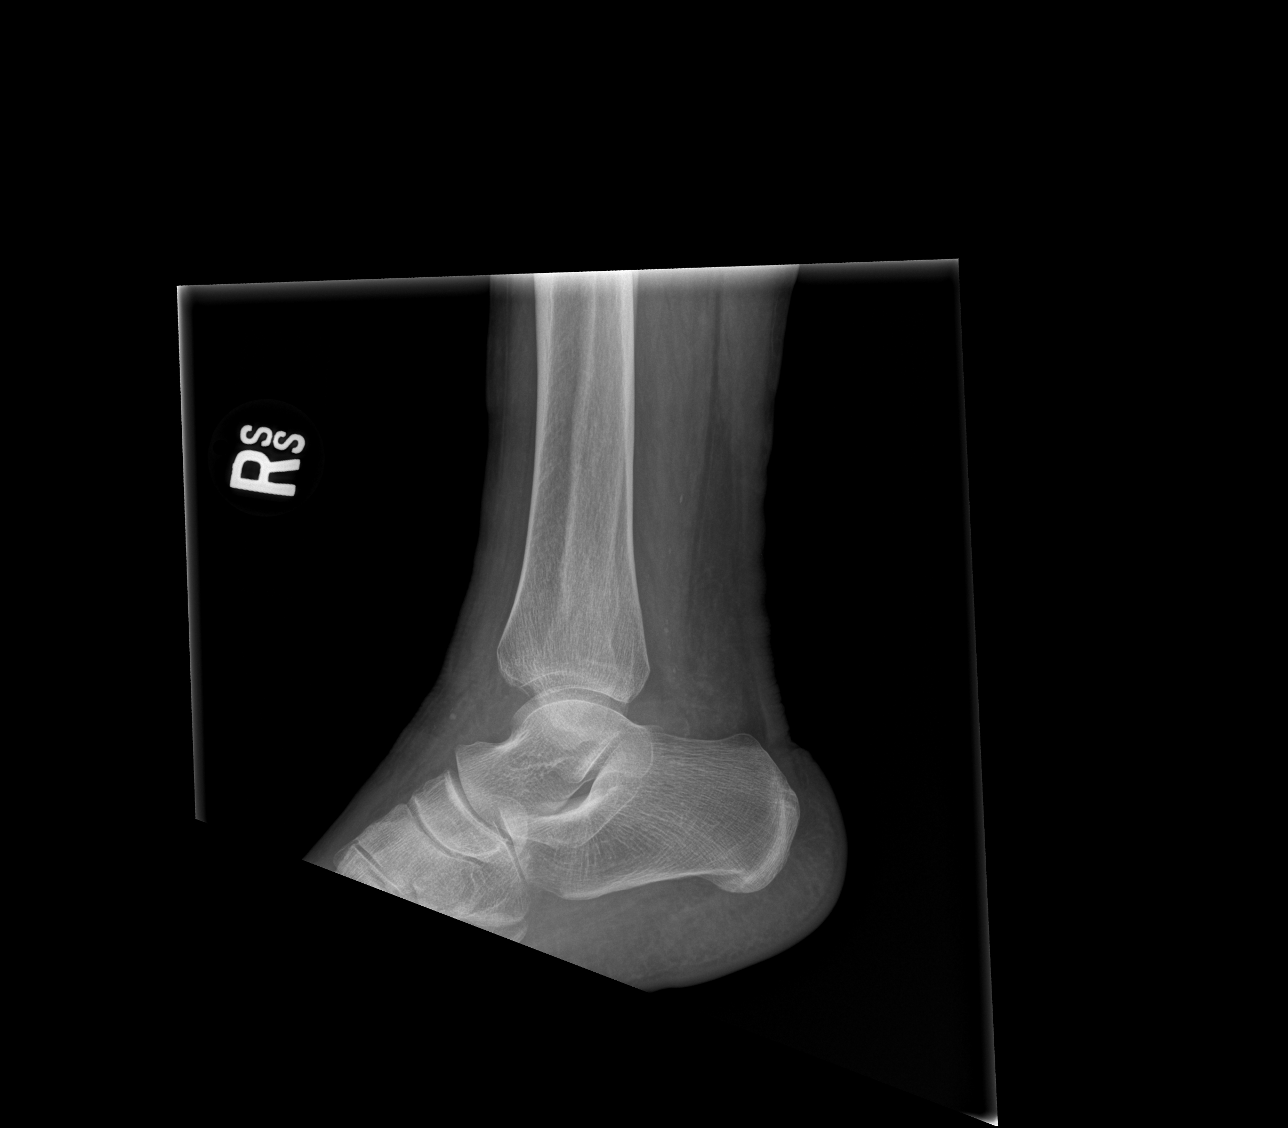

[3 of 3 positions shown; findings below may reference images not displayed]

FINDINGS: There is an oblique fracture of the distal right fibula with minimal
cortical depression. No significant displacement. Distal tibia and
talus appear intact. Mild lateral soft tissue swelling. Focal
cortical irregularity along the distal lateral calcaneus probably
represents avulsion fracture.
IMPRESSION: Oblique nondisplaced fracture of the distal right fibula with
probable avulsion fracture of the distal lateral calcaneus.

## 2016-11-21 IMAGING — CT CT CERVICAL SPINE W/O CM
3 of 4 series · 13 of 33 positions shown, 16 images · non-contrast
Comparison: MRI 05/12/2015

CLINICAL DATA: Anterior cervical fusion recently. Recent falls over
the last 4 days. Decreased sensation in the extremities. Diffuse
neck tenderness.

EXAM:
CT CERVICAL SPINE WITHOUT CONTRAST
TECHNIQUE: Multidetector CT imaging of the cervical spine was performed without
intravenous contrast. Multiplanar CT image reconstructions were also
generated.

[Series 3: c_spine 2.0 st · axial · 0.31mm/px · z∈[-238,-126]mm · 5 of 86 slices shown, 7 images]
[im 15/86  soft-tissue]
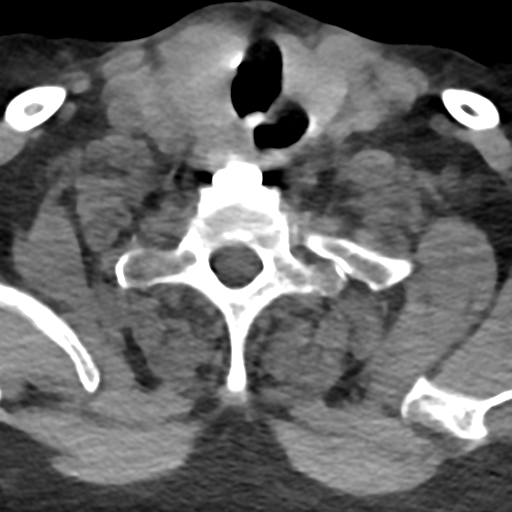
[im 15/86  bone]
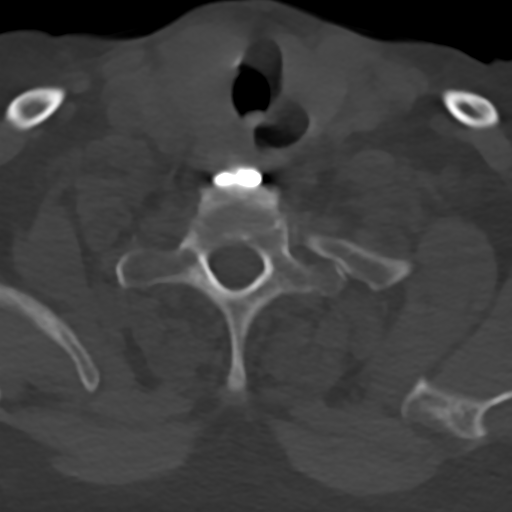
[im 29/86  bone]
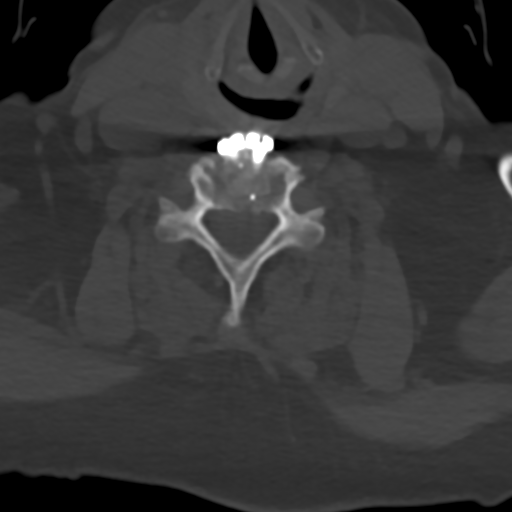
[im 43/86  bone]
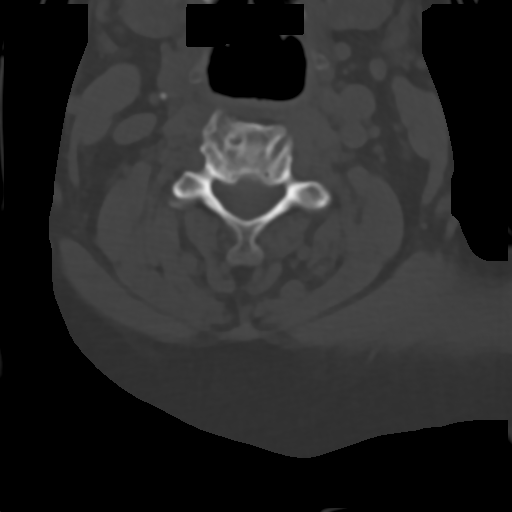
[im 57/86  bone]
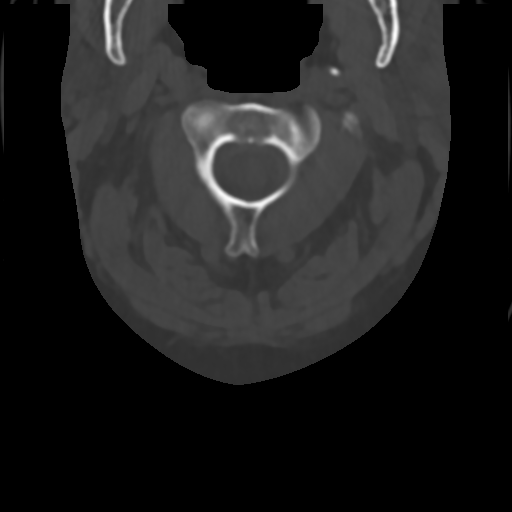
[im 71/86  soft-tissue]
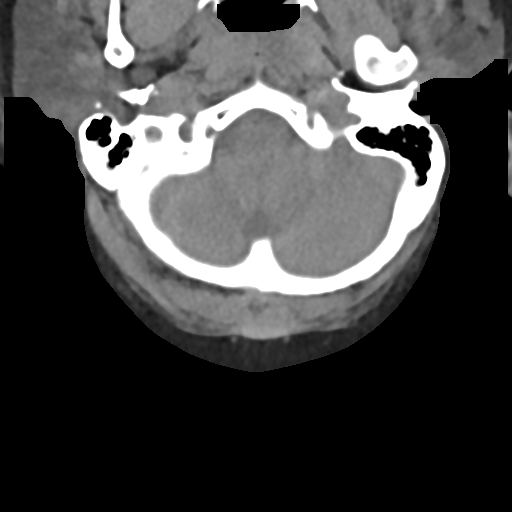
[im 71/86  bone]
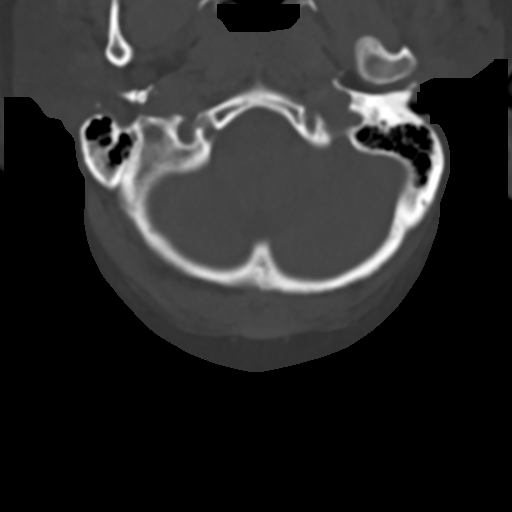

[Series 5: c_spine 2.0 sag bone · sagittal · 0.27mm/px · 5 of 53 slices shown, 6 images]
[im 18/53  bone]
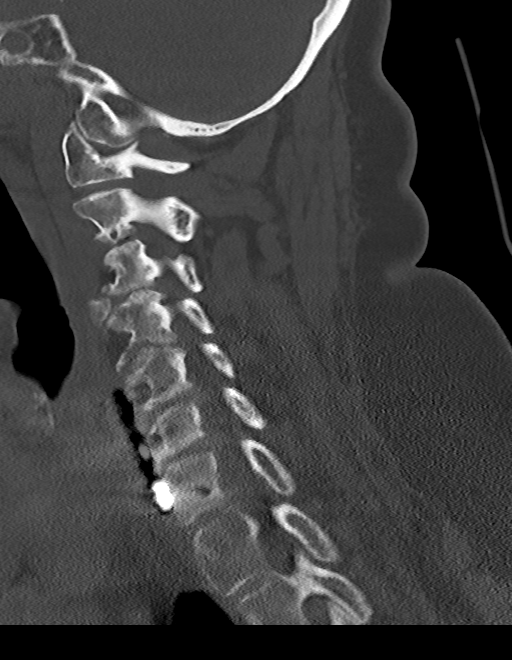
[im 22/53  bone]
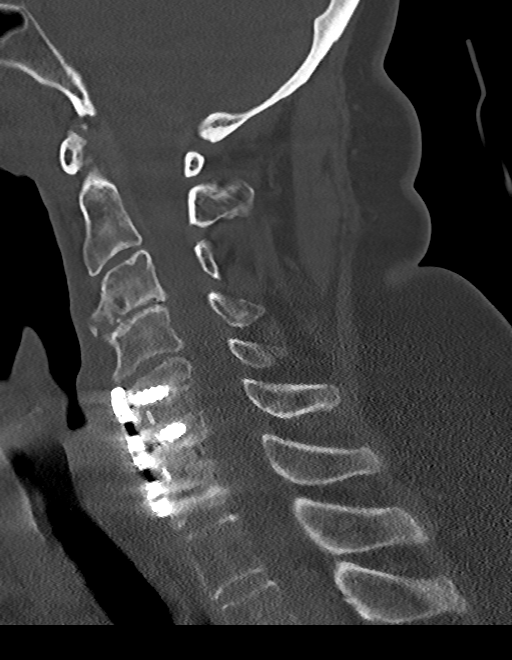
[im 27/53  soft-tissue]
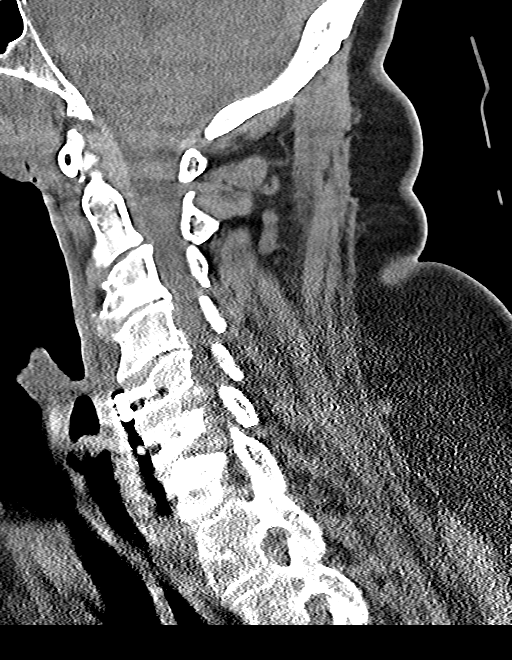
[im 27/53  bone]
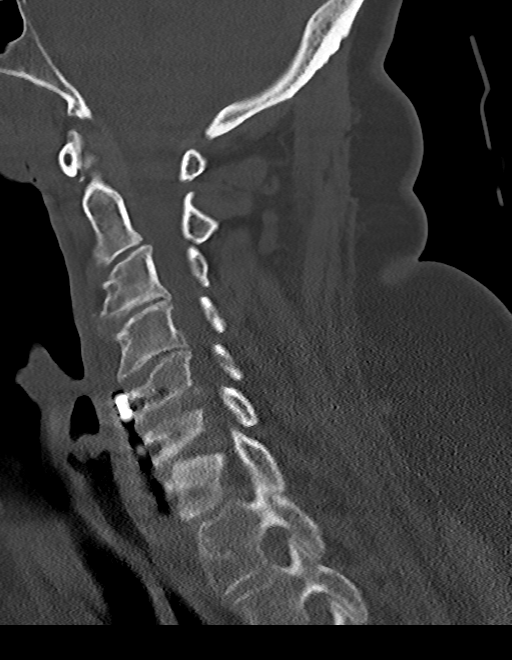
[im 31/53  bone]
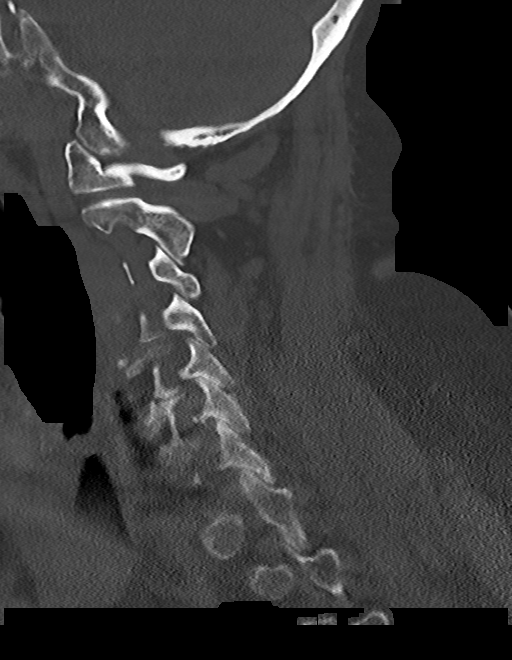
[im 35/53  bone]
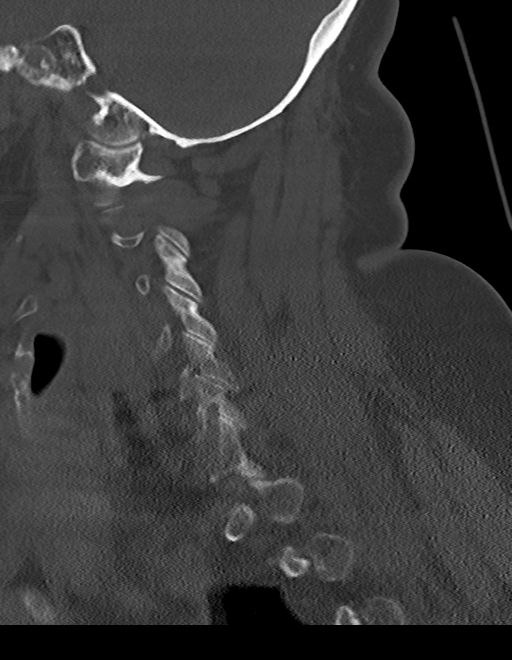

[Series 6: c_spine 2.0 cor bone · coronal · 0.25mm/px · 3 of 61 slices shown]
[im 13/61  bone]
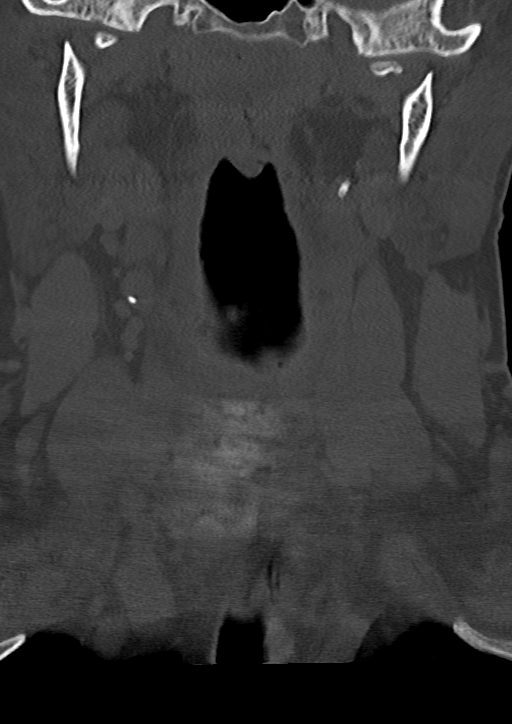
[im 25/61  bone]
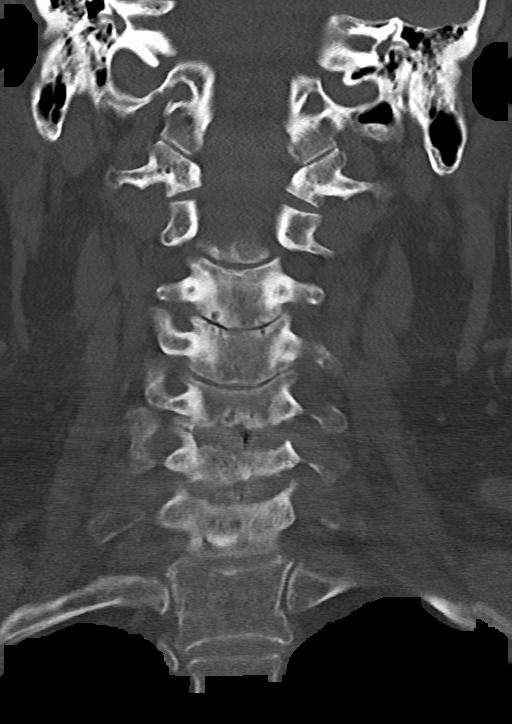
[im 37/61  bone]
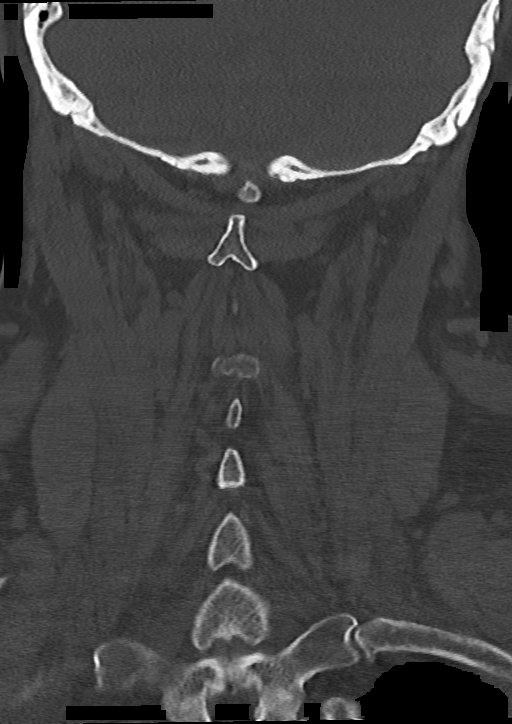

[13 of 33 positions shown; findings below may reference images not displayed]

FINDINGS: Changes of anterior fusion C5-C7. No hardware bony complicating
feature. The previously seen disc protrusion at C5-6 is not as well
visualized by CT. No fracture. Prevertebral soft tissues are normal.
No visible epidural or paraspinal hematoma.
IMPRESSION: Prior anterior fusion C5-C7.  No acute bony abnormality.

## 2016-11-21 IMAGING — MR MR THORACIC SPINE W/O CM
4 of 11 series · 17 of 48 positions shown · non-contrast
Comparison: CT cervical spine March 06, 2015 in cervical spine
radiograph April 28, 2015

CLINICAL DATA: Status post cervical decompression 3 weeks ago. Fell
today with unsteady gait and weakness.

EXAM:
MRI CERVICAL AND THORACIC SPINE WITHOUT CONTRAST
TECHNIQUE: Multiplanar and multiecho pulse sequences of the cervical spine, to
include the craniocervical junction and cervicothoracic junction,
and thoracic spine, were obtained without intravenous contrast.
Requested lumbar spine was unable to be performed due to patient
motion.

[Series 2: T2 · sagittal · 3.0mm · 0.43mm/px · 4 of 12 slices shown (1 of 4)]
[im 1/12]
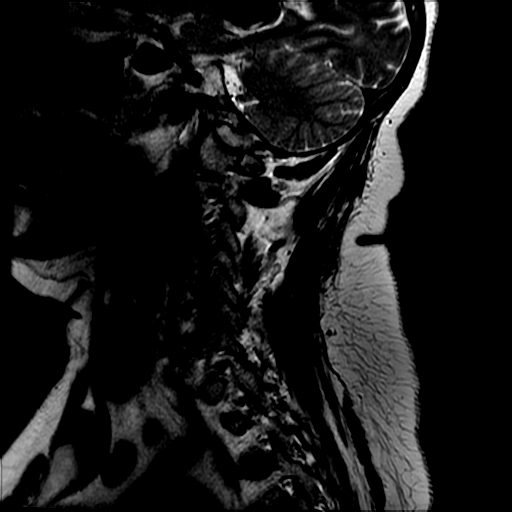
[im 4/12]
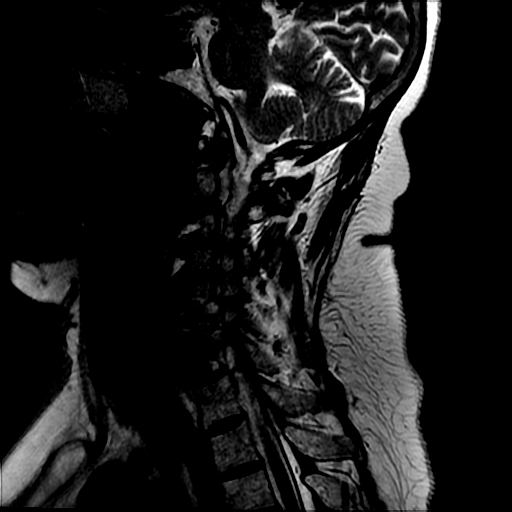
[im 8/12]
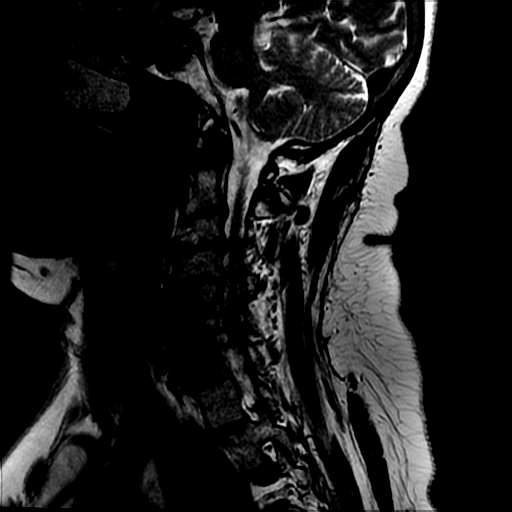
[im 12/12]
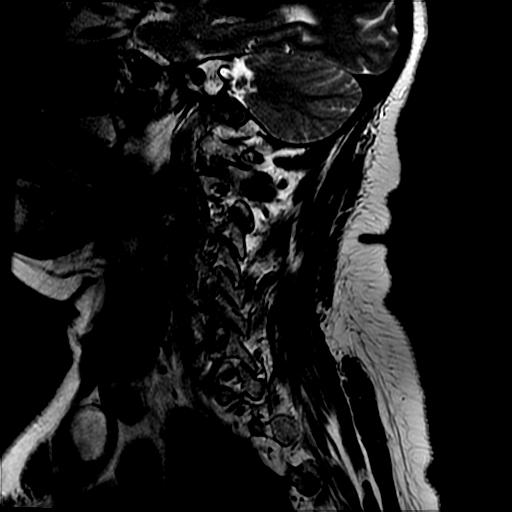

[Series 8: T2 · axial · 3.0mm · 0.39mm/px · z∈[-87,+9]mm · 7 of 31 slices shown (2 of 4)]
[im 1/31]
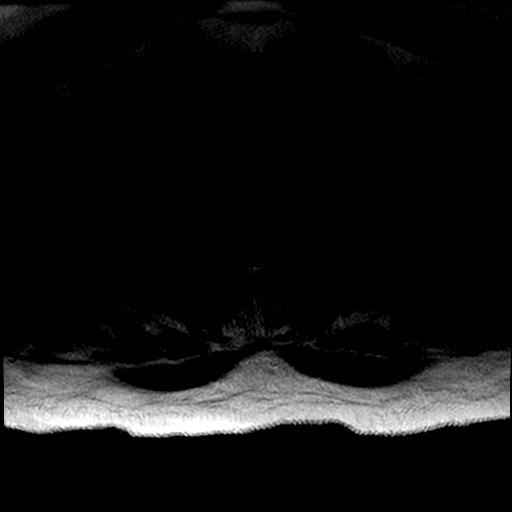
[im 6/31]
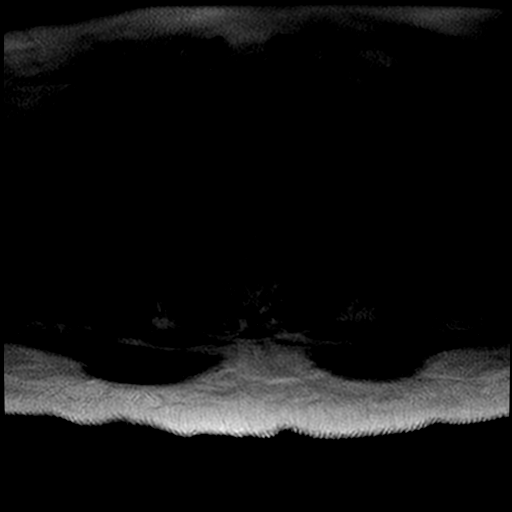
[im 11/31]
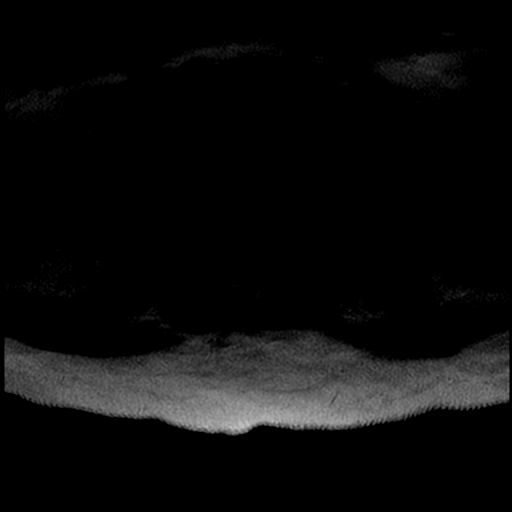
[im 16/31]
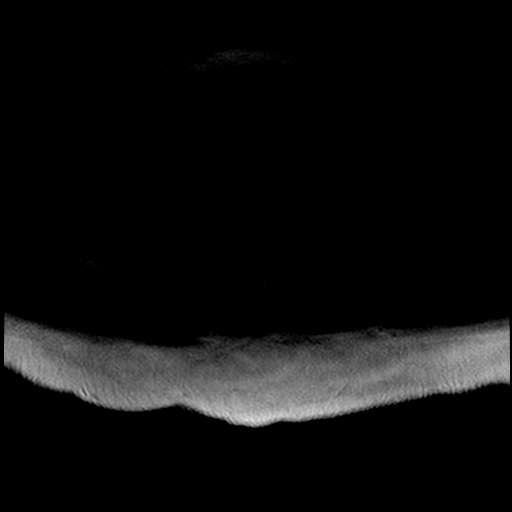
[im 21/31]
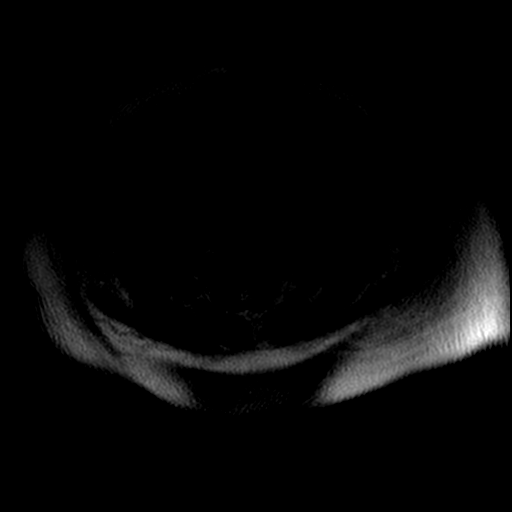
[im 26/31]
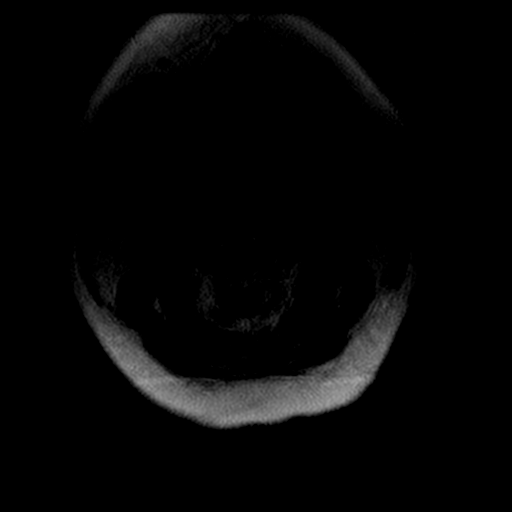
[im 31/31]
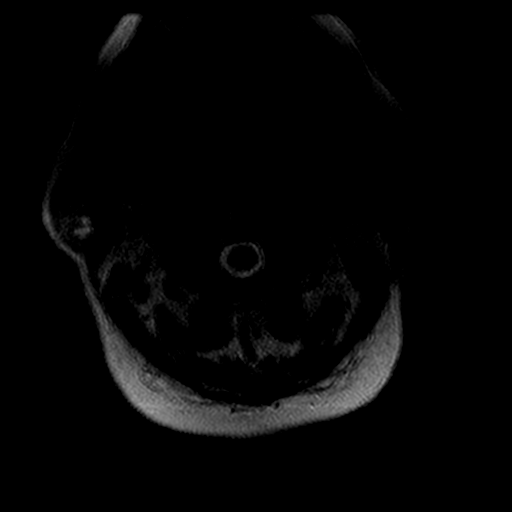

[Series 13: T2 · sagittal · 3.0mm · 0.62mm/px · 3 of 12 slices shown (3 of 4)]
[im 1/12]
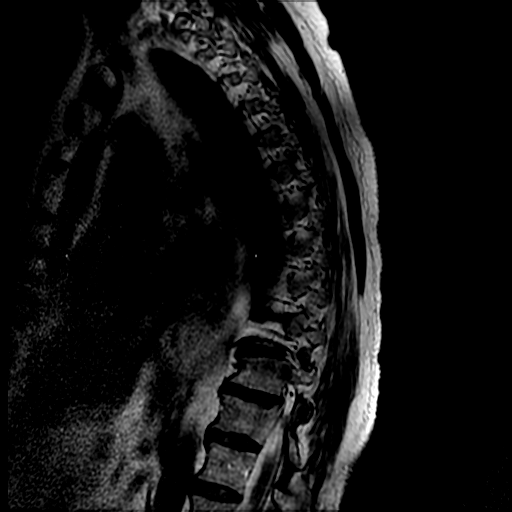
[im 6/12]
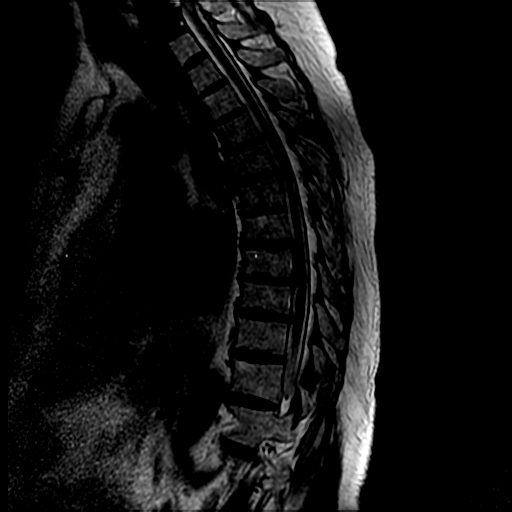
[im 12/12]
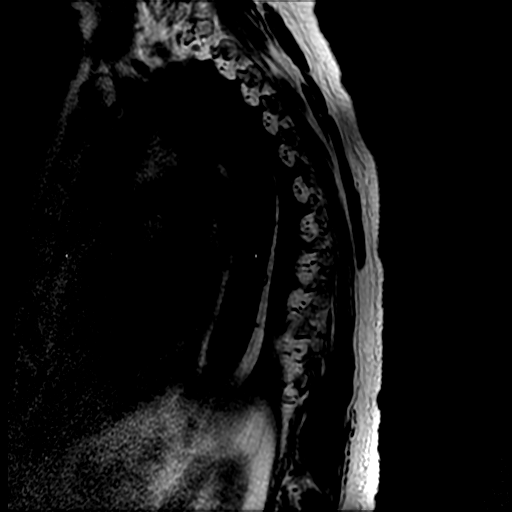

[Series 19: T2 · axial · 4.0mm · 0.43mm/px · z∈[-269,-118]mm · 3 of 39 slices shown (4 of 4)]
[im 5/39]
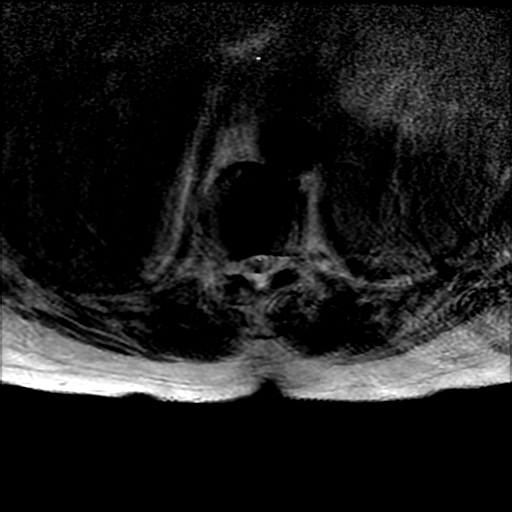
[im 20/39]
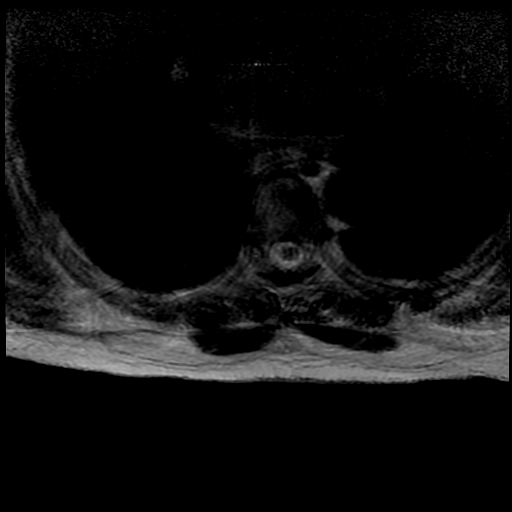
[im 34/39]
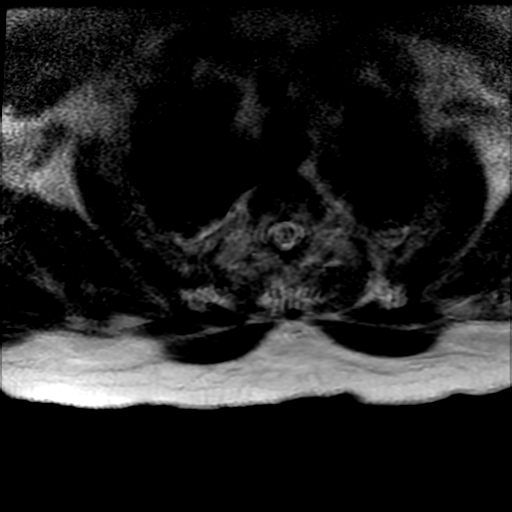

[17 of 48 positions shown; findings below may reference images not displayed]

FINDINGS: MRI CERVICAL SPINE FINDINGS (multiple sequences are moderately
motion degraded)

Cervical vertebral bodies intact with maintenance of cervical
lordosis. Status post C5 through C7 ACDF, better characterized on
prior radiograph; hardware results in local susceptibility artifact.
Moderate to severe C3-4 and C4-5 disc height loss. Decreased T2
signal within all non surgically altered disc compatible with
desiccation. Moderate to severe chronic discogenic endplate changes
C3-4 and C4-5. No STIR signal abnormality to suggest fracture.

Multilevel mild spinal cord deformity with which short approximately
12 mm segment of mild suspected cervical spinal cord edema at C6-7
associated with cord compression as described below. No syrinx.
Craniocervical junction is intact.

Due to motion, the axial sequences do not correspond to the sagittal
sequences and are nearly nondiagnostic.

Small broad-based disc bulge is C2-3 through C4-5 resulting in
moderate canal stenosis. Suspected at least moderate neural
foraminal narrowing at these levels.

At C5-6, status post discectomy. At C6-7, however it is a 4 mm
central T2 bright probable protrusion resulting in severe canal
stenosis, AP dimension the canal is 5 mm.

At C6-7 C7-T1 there is no definite disc bulge, canal stenosis or
neural foraminal narrowing.

MRI THORACIC SPINE FINDINGS

Motion degraded examination, patient was unable to complete the
examination and axial T1 sequence not obtained. Axial sequences do
not well correspond to the sagittal sequences.

Thoracic vertebral bodies intact and aligned and maintenance of the
thoracic kyphosis. Intervertebral disc morphology generally
preserved with decreased T2 signal within all disc compatible with
mild desiccation. Multilevel mild chronic discogenic endplate
changes. No definite STIR signal abnormality to suggest acute
osseous process though limited by motion. Limited assessment of the
prevertebral and paraspinal muscles.

Prominent epidural lipomatosis mildly narrows the thecal sac. In
addition, T5-6 6 mm RIGHT central disc extrusion with 18 mm of
contiguous superior and inferior migration results in at least
moderate canal stenosis and cord deformity. No syrinx. Small
broad-based disc bulge T8-9 results in mild canal stenosis and cord
deformity.
IMPRESSION: MRI CERVICAL SPINE: Limited motion degraded examination. Status post
recent C5 through C7 ACDF. 4 mm probable disc protrusion at C5-6
(less likely seroma or abscess) results in severe canal stenosis and
short segment of cord edema/ pre syrinx.

No acute fracture or malalignment.

Suspected at least moderate canal stenosis and neural foraminal
narrowing C2-3 through C4-5.

MRI THORACIC SPINE: Limited examination. Moderate T5-6 contiguous
disc extrusion results in moderate canal stenosis. Mild canal
stenosis at T8-9 due to small disc bulge.

No acute fracture or malalignment.

Acute findings discussed with and reconfirmed by Dr.TLEMPOS HISAAN
on 05/12/2015 at [DATE].

## 2016-11-21 IMAGING — CR DG ANKLE PORT 2V*L*
3 series · 3 of 3 positions shown · non-contrast
Comparison: None.

CLINICAL DATA: Pain after a fall.

EXAM:
PORTABLE LEFT ANKLE - 2 VIEW

[AP]
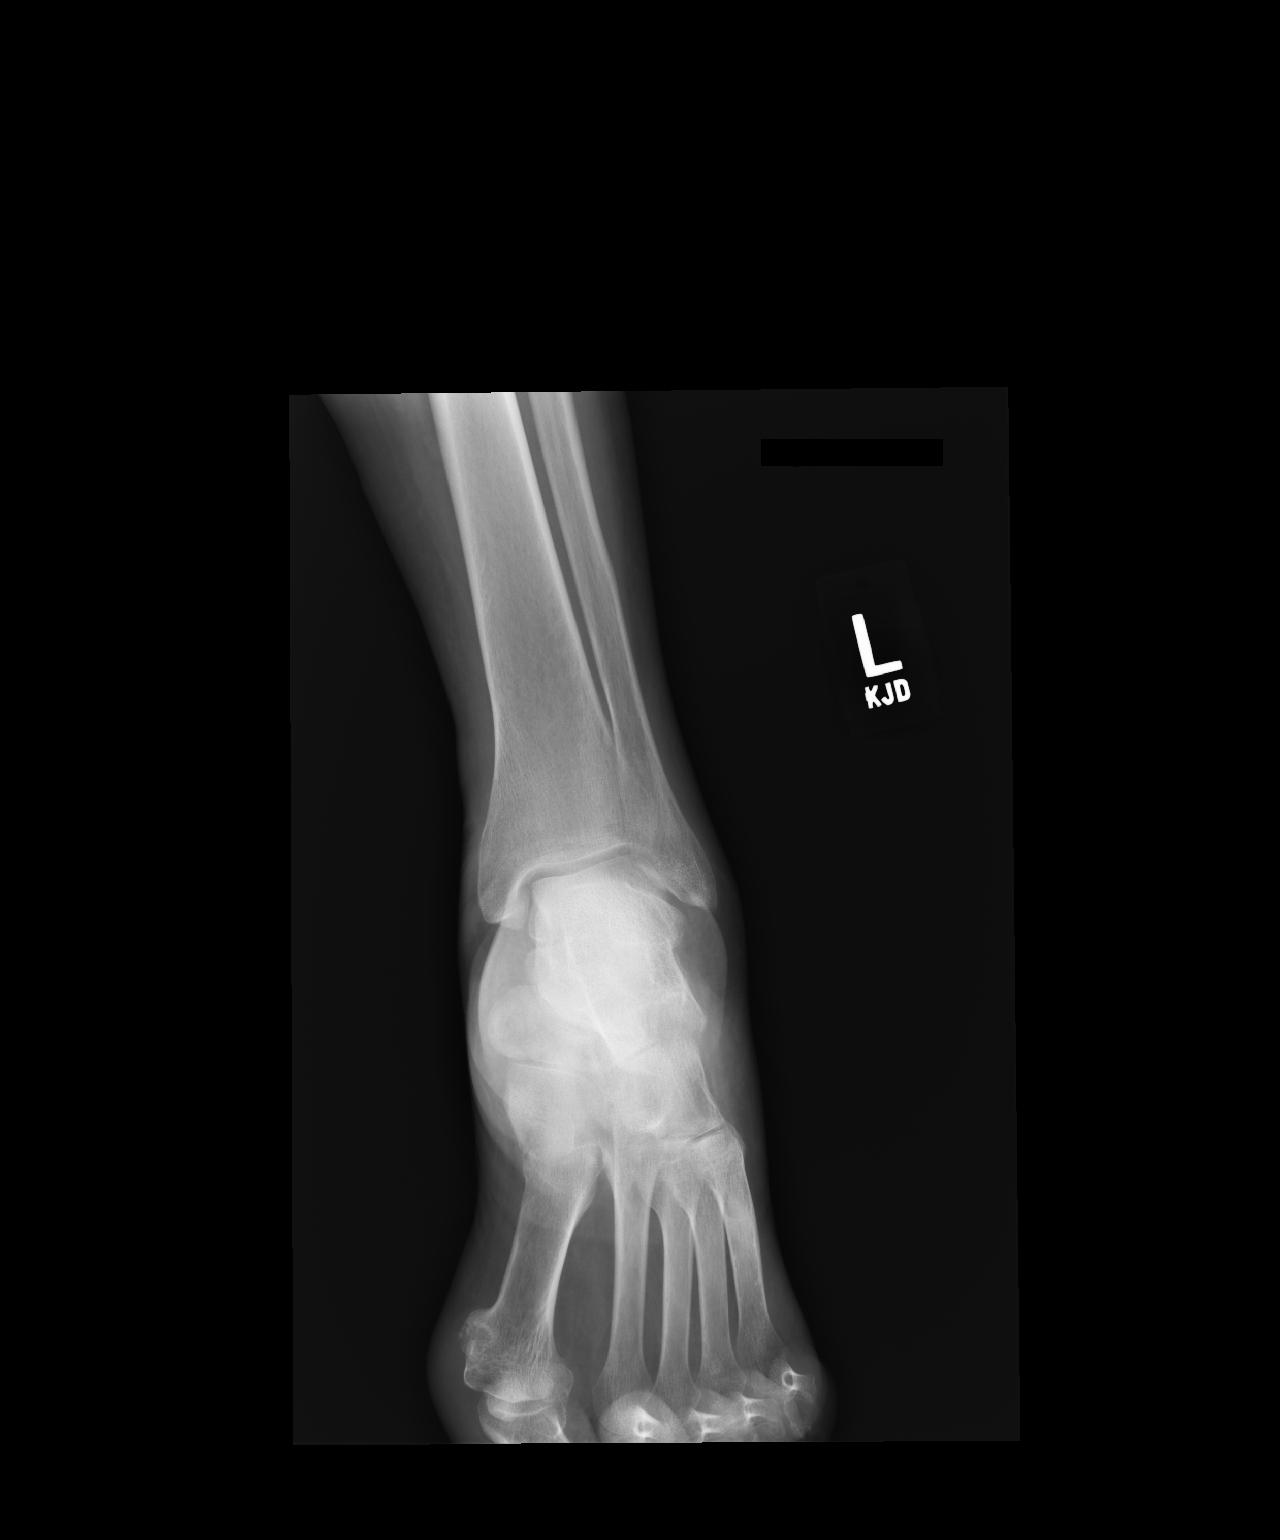

[lateral]
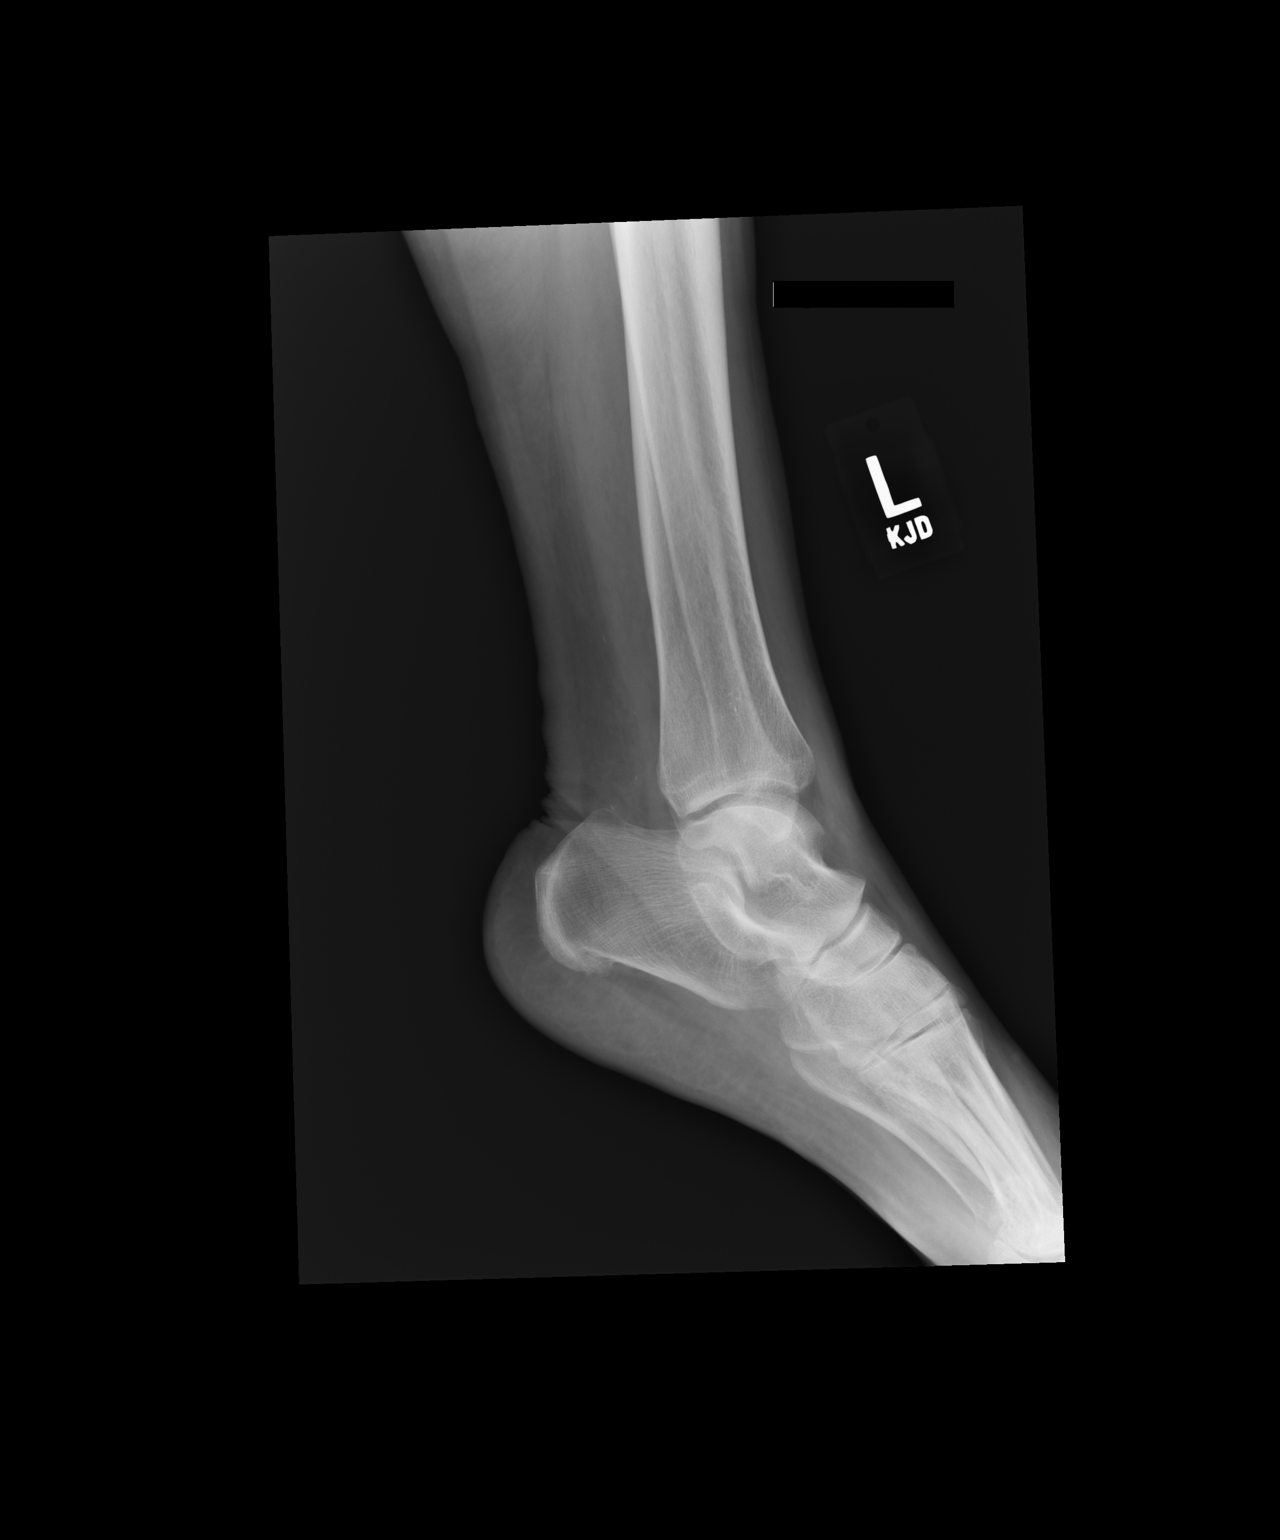

[ap obl int rot]
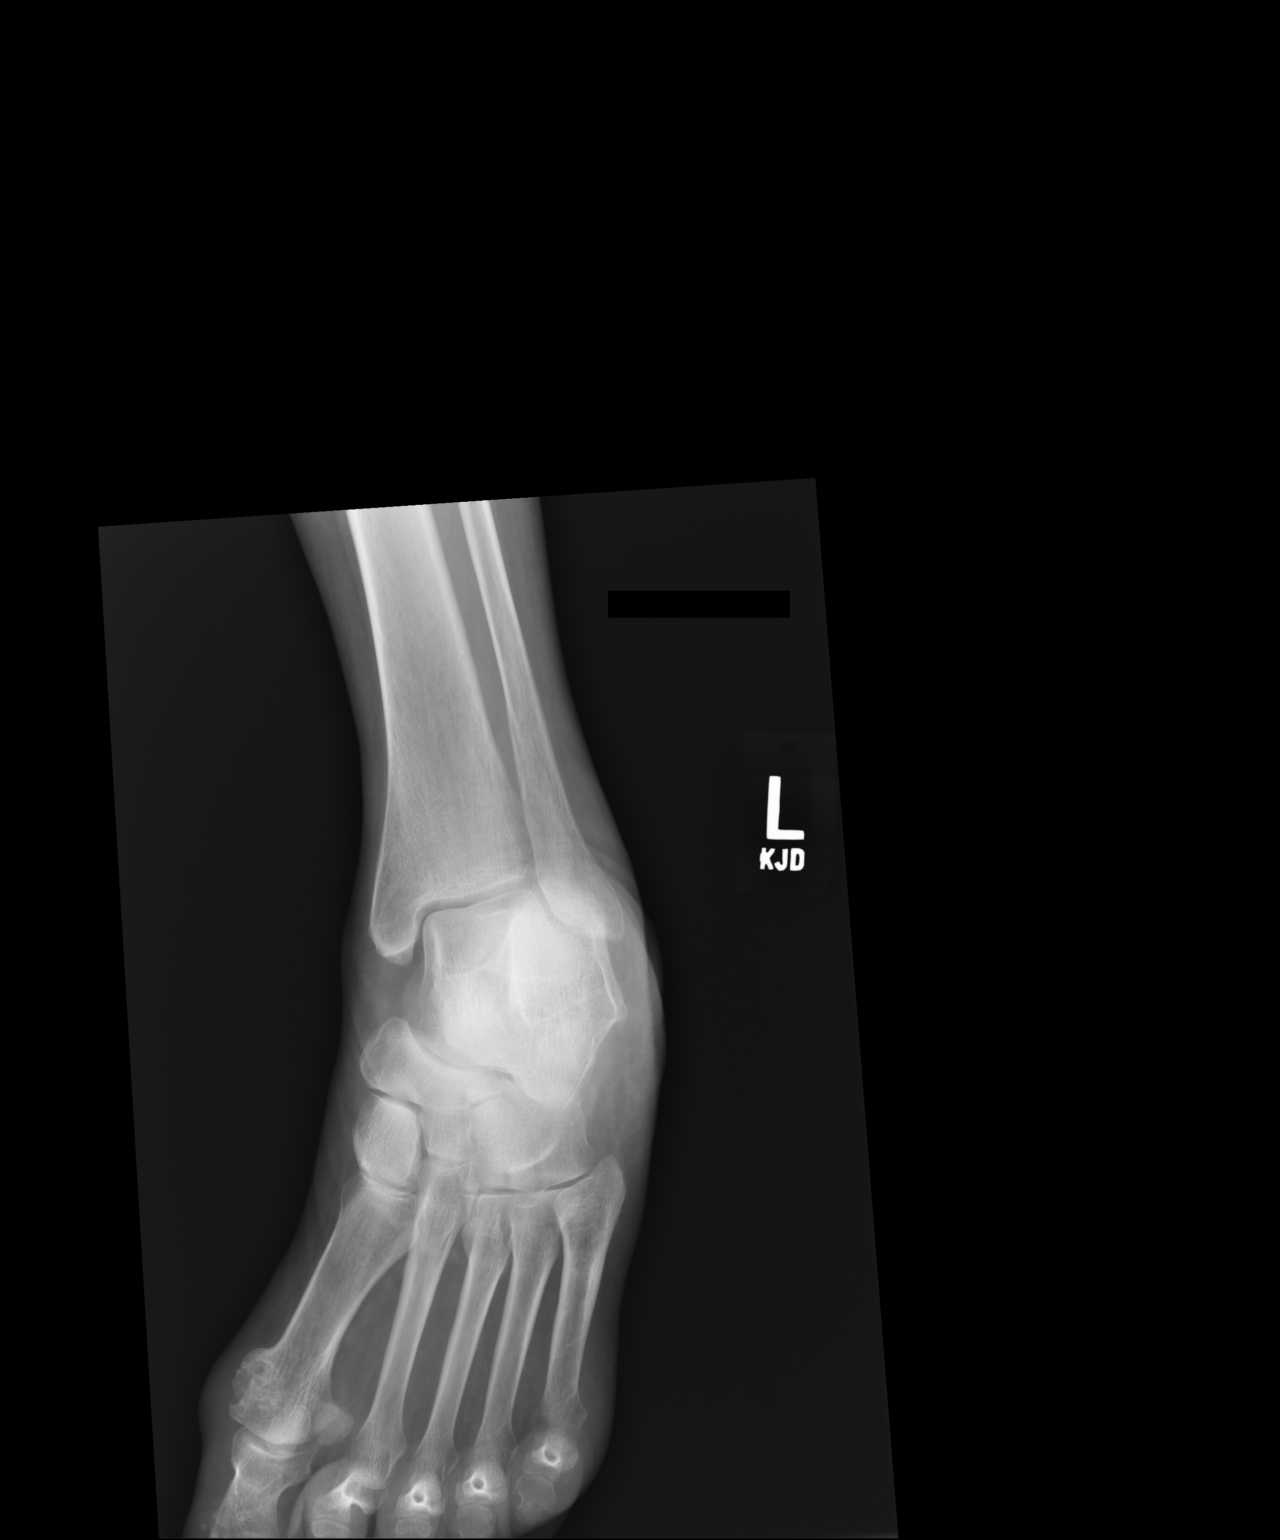

[3 of 3 positions shown; findings below may reference images not displayed]

FINDINGS: There is no evidence of fracture, dislocation, or joint effusion.
There is no evidence of arthropathy or other focal bone abnormality.
Soft tissues are unremarkable.
IMPRESSION: Negative.

## 2016-11-22 ENCOUNTER — Inpatient Hospital Stay (HOSPITAL_COMMUNITY)
Admission: RE | Admit: 2016-11-22 | Discharge: 2016-11-22 | Disposition: A | Discharge status: Home / Self Care | Payer: Medicaid Other | Source: Ambulatory Visit

## 2016-11-22 NOTE — Pre-Procedure Instructions (Signed)
[name]  11/22/2016      [id number] [address], [place] [address] Phone: [phone] Fax: [phone]  [place], [place] - [address] [place] [address] Phone: [phone] Fax: [phone]    Your procedure is scheduled on Wednesday, November 14th   Report to [facility] Admitting at 2:15 PM             (posted surgery time 4:17p - 6:12p)   Call this number if you have problems the morning of surgery:  [phone].   Remember:   Do not eat food or drink liquids after midnight Tuesday.              As of today, STOP TAKING any Vitamins, Herbal Supplements, Anti-inflammatories   Take these medicines the morning of surgery with A SIP OF WATER : Amlodipine, Gabapentin, Hydrocodone, Oxybutynin, Pantoprazole.  Please use your Flonase & Inhaler that morning.   Do not wear jewelry, no makeup or nail polish.  Do not wear lotions, perfumes or deoderant.  Do not shave underarms & legs 48 hrs prior to surgery.   Do not bring valuables to the hospital.  [facility] is not responsible for any belongings or valuables.  Contacts, dentures or bridgework may not be worn into surgery.  Leave your suitcase in the car.  After surgery it may be brought to your room.  For patients admitted to the hospital, discharge time will be determined by your treatment team.   Please read over the following fact sheets that you were given. Pain Booklet, MRSA Information and Surgical Site Infection Prevention     [place]- Preparing For Surgery  Before surgery, you can play an important role. Because skin is not sterile, your skin needs to be as free of germs as possible. You can reduce the number of germs on your skin by washing with CHG (chlorahexidine gluconate) Soap before surgery.  CHG is an antiseptic cleaner which kills germs and bonds with the skin to  continue killing germs even after washing.  Please do not use if you have an allergy to CHG or antibacterial soaps. If your skin becomes reddened/irritated stop using the CHG.  Do not shave (including legs and underarms) for at least 48 hours prior to first CHG shower. It is OK to shave your face.  Please follow these instructions carefully.   1. Shower the NIGHT BEFORE SURGERY and the MORNING OF SURGERY with CHG.   2. If you chose to wash your hair, wash your hair first as usual with your normal shampoo.  3. After you shampoo, rinse your hair and body thoroughly to remove the shampoo.  4. Use CHG as you would any other liquid soap. You can apply CHG directly to the skin and wash gently with a scrungie or a clean washcloth.   5. Apply the CHG Soap to your body ONLY FROM THE NECK DOWN.  Do not use on open wounds or open sores. Avoid contact with your eyes, ears, mouth and genitals (private parts). Wash Face and genitals (private parts)  with your normal soap.  6. Wash thoroughly, paying special attention to the area where your surgery will be performed.  7. Thoroughly rinse your body with warm water from the neck down.  8. DO NOT shower/wash with your normal soap after using and rinsing off the CHG Soap.  9. Pat yourself dry  with a CLEAN TOWEL.  10. Wear CLEAN PAJAMAS to bed the night before surgery, wear comfortable clothes the morning of surgery  11. Place CLEAN SHEETS on your bed the night of your first shower and DO NOT SLEEP WITH PETS.    Day of Surgery: Do not apply any deodorants/lotions. Please wear clean clothes to the hospital/surgery center.

## 2016-11-23 ENCOUNTER — Ambulatory Visit: Payer: Medicaid Other | Admitting: Physician Assistant

## 2016-11-23 NOTE — Telephone Encounter (Signed)
Left a message for Dr. Ronnald Ramp surgery scheduler to let her know that pt didn't show for her appt, therefore she hasn't been cleared.

## 2016-11-24 ENCOUNTER — Encounter: Payer: Self-pay | Admitting: Physician Assistant

## 2016-11-25 ENCOUNTER — Ambulatory Visit (INDEPENDENT_AMBULATORY_CARE_PROVIDER_SITE_OTHER): Payer: Medicaid Other | Admitting: Orthopaedic Surgery

## 2016-12-01 ENCOUNTER — Encounter (INDEPENDENT_AMBULATORY_CARE_PROVIDER_SITE_OTHER): Payer: Self-pay | Admitting: Orthopaedic Surgery

## 2016-12-01 ENCOUNTER — Ambulatory Visit (INDEPENDENT_AMBULATORY_CARE_PROVIDER_SITE_OTHER): Payer: Medicaid Other

## 2016-12-01 ENCOUNTER — Ambulatory Visit (INDEPENDENT_AMBULATORY_CARE_PROVIDER_SITE_OTHER): Payer: Medicaid Other | Admitting: Orthopaedic Surgery

## 2016-12-01 DIAGNOSIS — S8264XD Nondisplaced fracture of lateral malleolus of right fibula, subsequent encounter for closed fracture with routine healing: Secondary | ICD-10-CM | POA: Diagnosis not present

## 2016-12-01 MED ORDER — HYDROCODONE-ACETAMINOPHEN 5-325 MG PO TABS: 1.0000 | ORAL_TABLET | Freq: Three times a day (TID) | ORAL | 0 refills | Status: DC | PRN

## 2016-12-01 NOTE — Progress Notes (Signed)
The patient is now about 8 weeks into a nondisplaced lateral malleolus fracture of her right ankle.  She is been wearing an cam walker boot.  She is someone who has rheumatoid disease and is seeking a pain management specialist which she said her primary care doctor is trying to get her into.  As far as her ankle goes she has been having swelling in the Cam walker starting to cause her to have right knee pain.  On examination she has excellent range of motion of her right ankle with minimal discomfort.  The swelling is moderate.  Ankles well located.  3 views of the right ankle are obtained and show well located intact ankle mortise.  The fibular fracture shows significant healing and remains nondisplaced and looks great overall.  At this point we do not need to x-ray her ankle again.  We will transition her from the Cam walker to an ASO which she should wear until she feels like she can go without it.  All questions and concerns were answered and addressed.  I did give her a one-time prescription for hydrocodone for pain to use sparingly.  She will follow-up as needed at this point.

## 2016-12-04 IMAGING — CR DG CHEST 2V
2 series · 2 of 2 positions shown · non-contrast
Comparison: Chest radiograph dated 04/09/2015

CLINICAL DATA: 59-year-old female with chest pain

EXAM:
CHEST  2 VIEW

[chest lat]
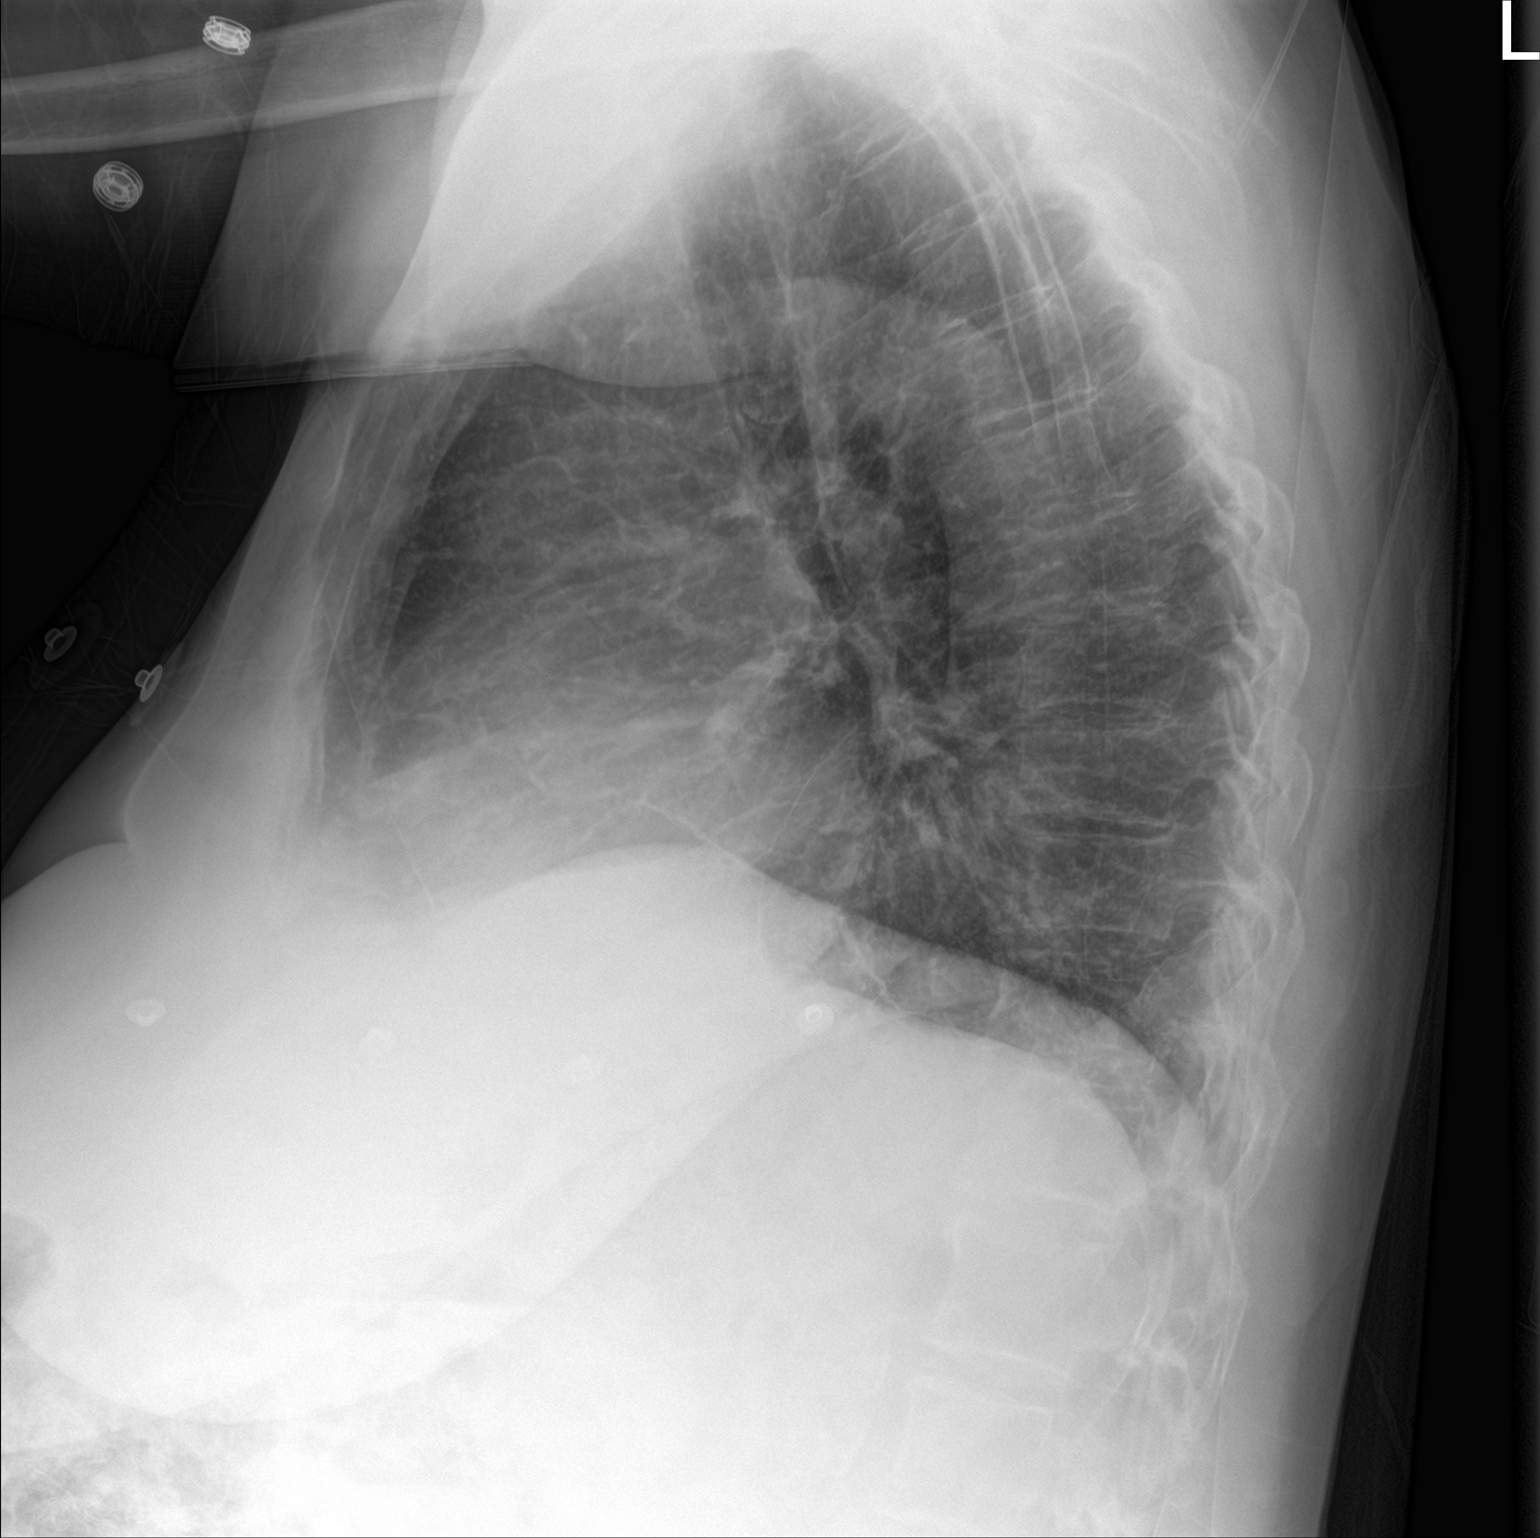

[chest ap]
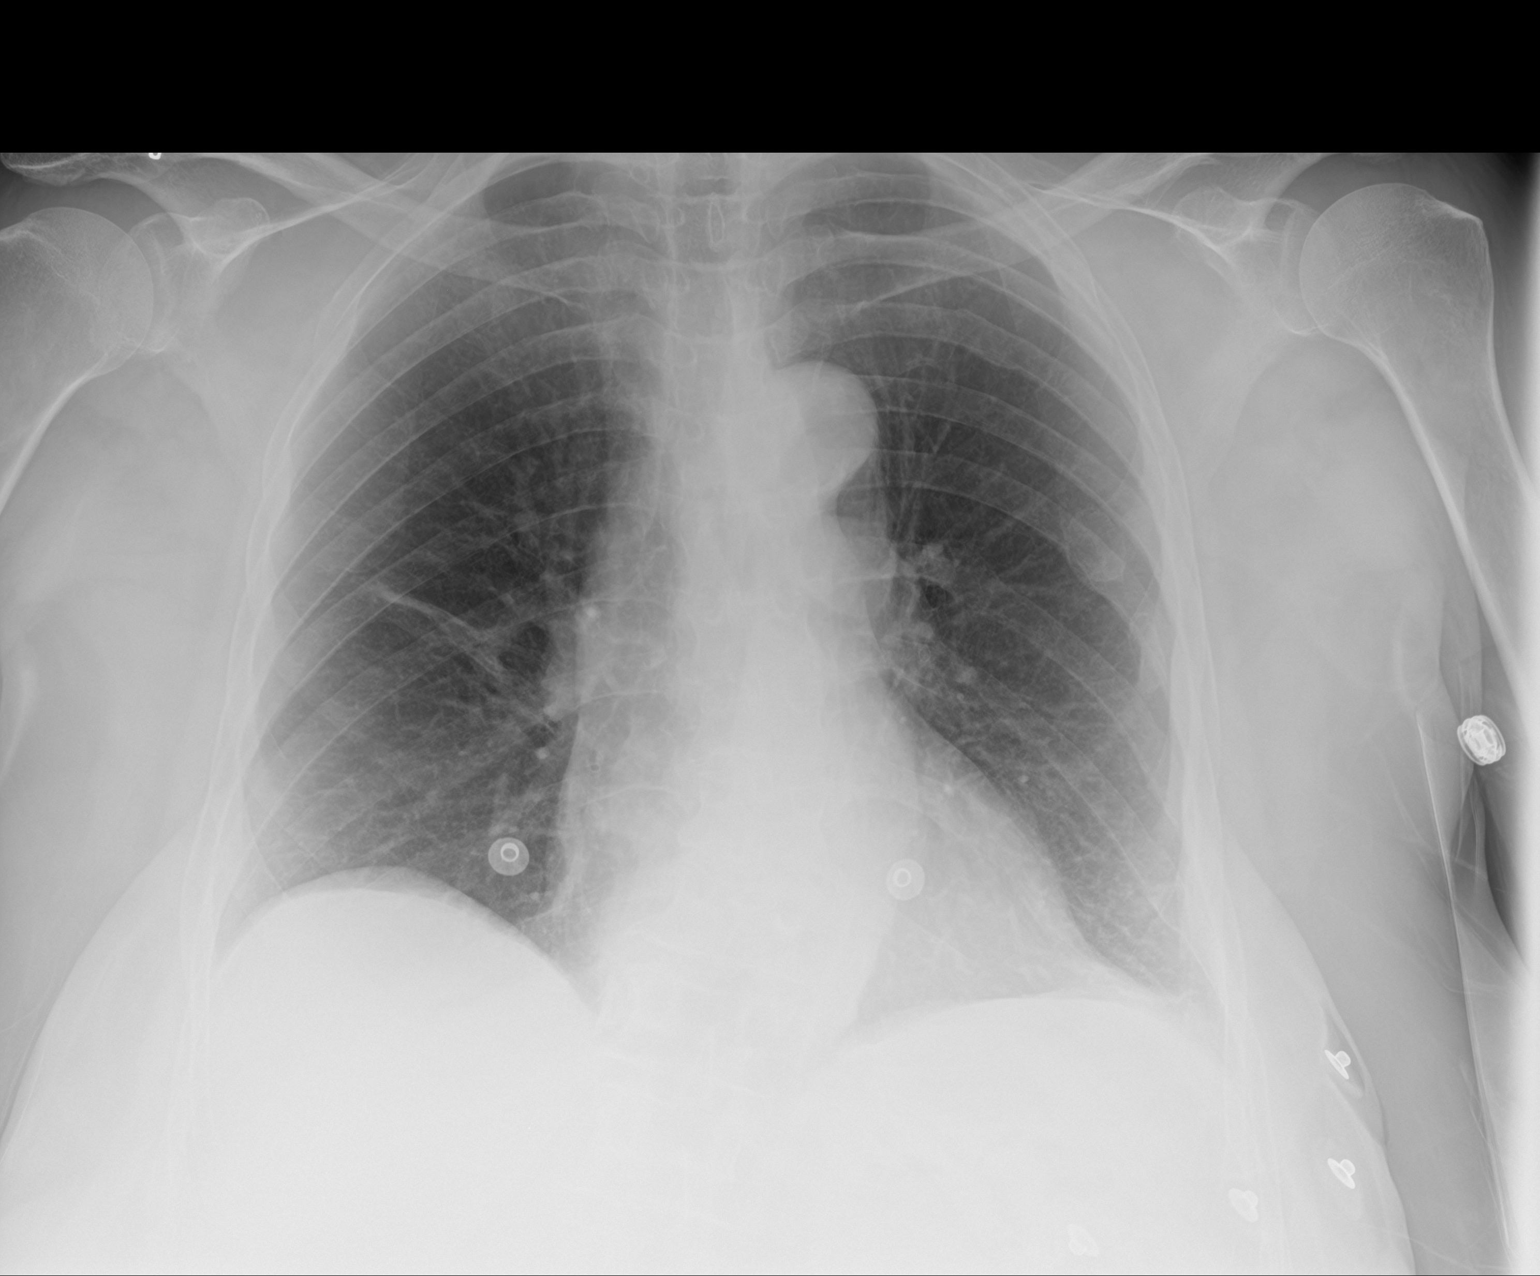

[2 of 2 positions shown; findings below may reference images not displayed]

FINDINGS: Two views of the chest do not demonstrate a focal consolidation.
There is no pleural effusion or pneumothorax. Linear atelectasis/
scarring noted in the right mid lung field. The cardiac silhouette
is within normal limits. No acute osseous pathology. Lower cervical
fixation plate and screws.
IMPRESSION: No active cardiopulmonary disease.

## 2016-12-08 ENCOUNTER — Telehealth (INDEPENDENT_AMBULATORY_CARE_PROVIDER_SITE_OTHER): Payer: Self-pay | Admitting: Orthopaedic Surgery

## 2016-12-08 IMAGING — CT CT ABD-PELV W/O CM
2 of 4 series · 9 of 46 positions shown, 10 images · non-contrast
Comparison: February 14, 2015

CLINICAL DATA: Constipation for past week. Diffuse abdomen pain
with nausea and vomiting. Recent fall

EXAM:
CT ABDOMEN AND PELVIS WITHOUT CONTRAST
TECHNIQUE: Multidetector CT imaging of the abdomen and pelvis was performed
following the standard protocol without oral or intravenous contrast
material administration.

[Series 201: routine, idose (2) · axial · 0.83mm/px · z∈[+97,+472]mm · 6 of 95 slices shown, 7 images]
[im 12/95  soft-tissue]
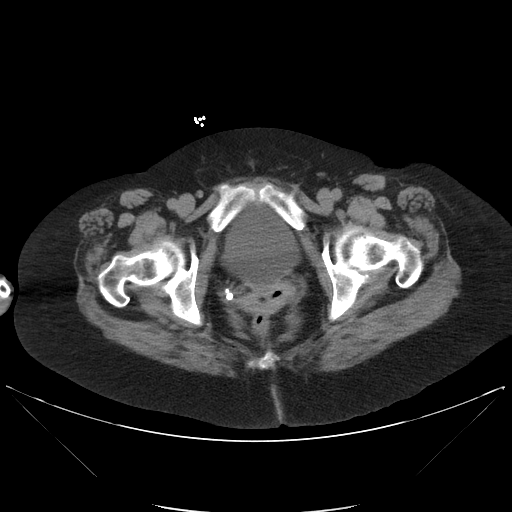
[im 12/95  bone]
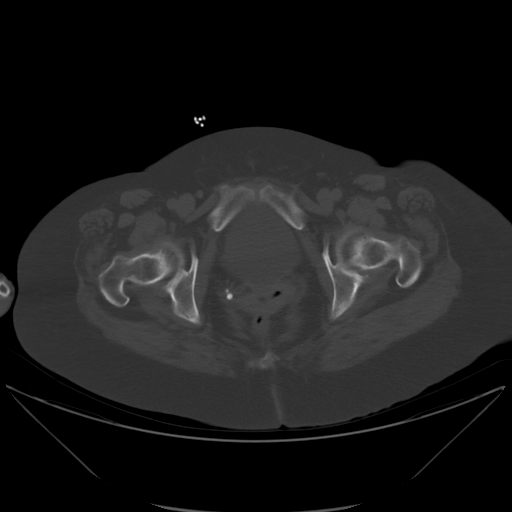
[im 27/95  soft-tissue]
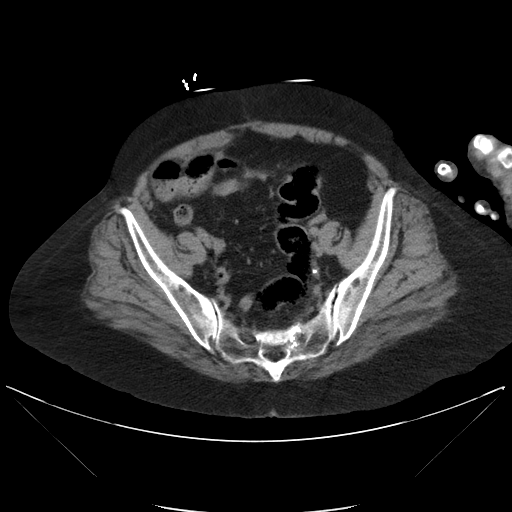
[im 42/95  soft-tissue]
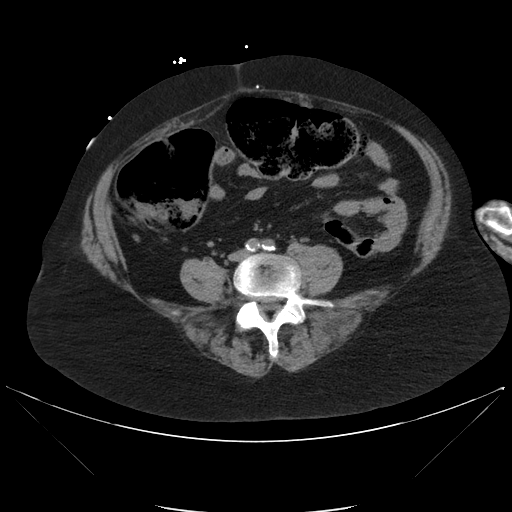
[im 57/95  soft-tissue]
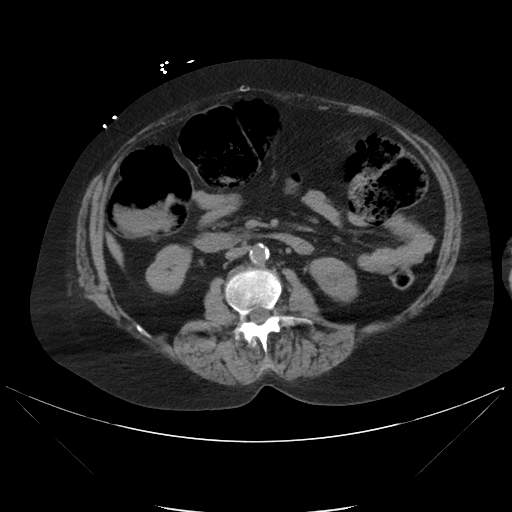
[im 72/95  soft-tissue]
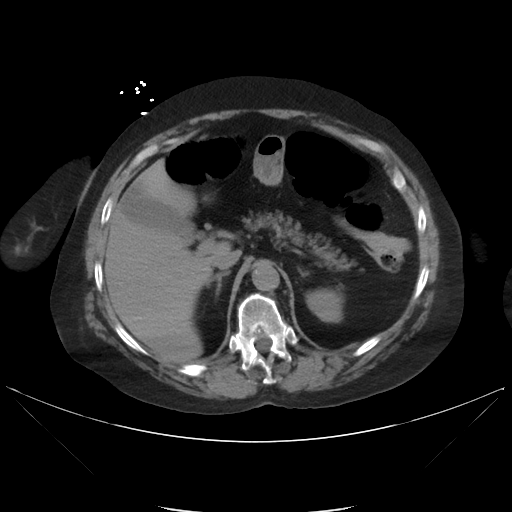
[im 87/95  soft-tissue]
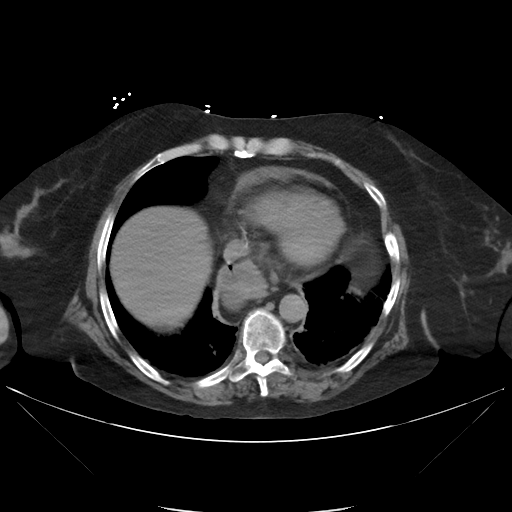

[Series 203: coronals, idose (2) · coronal · 0.45mm/px · 3 of 126 slices shown]
[im 42/126  soft-tissue]
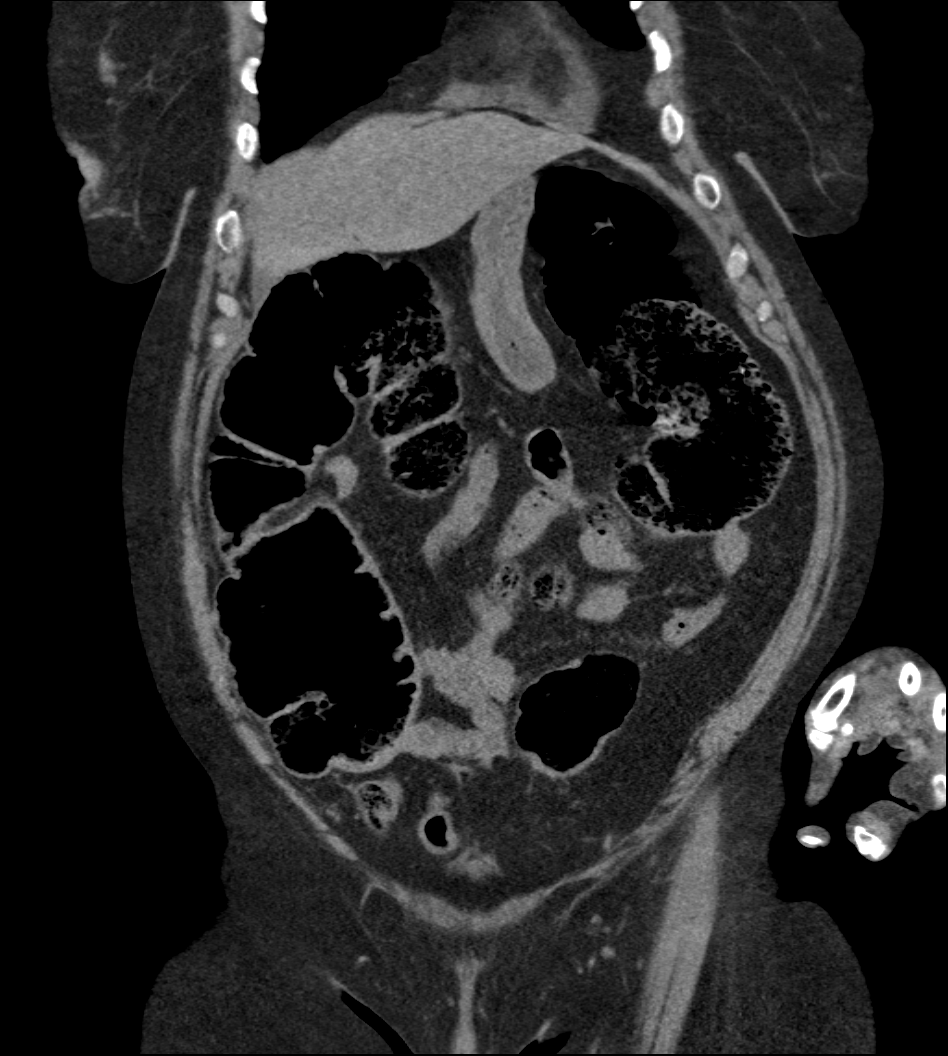
[im 56/126  soft-tissue]
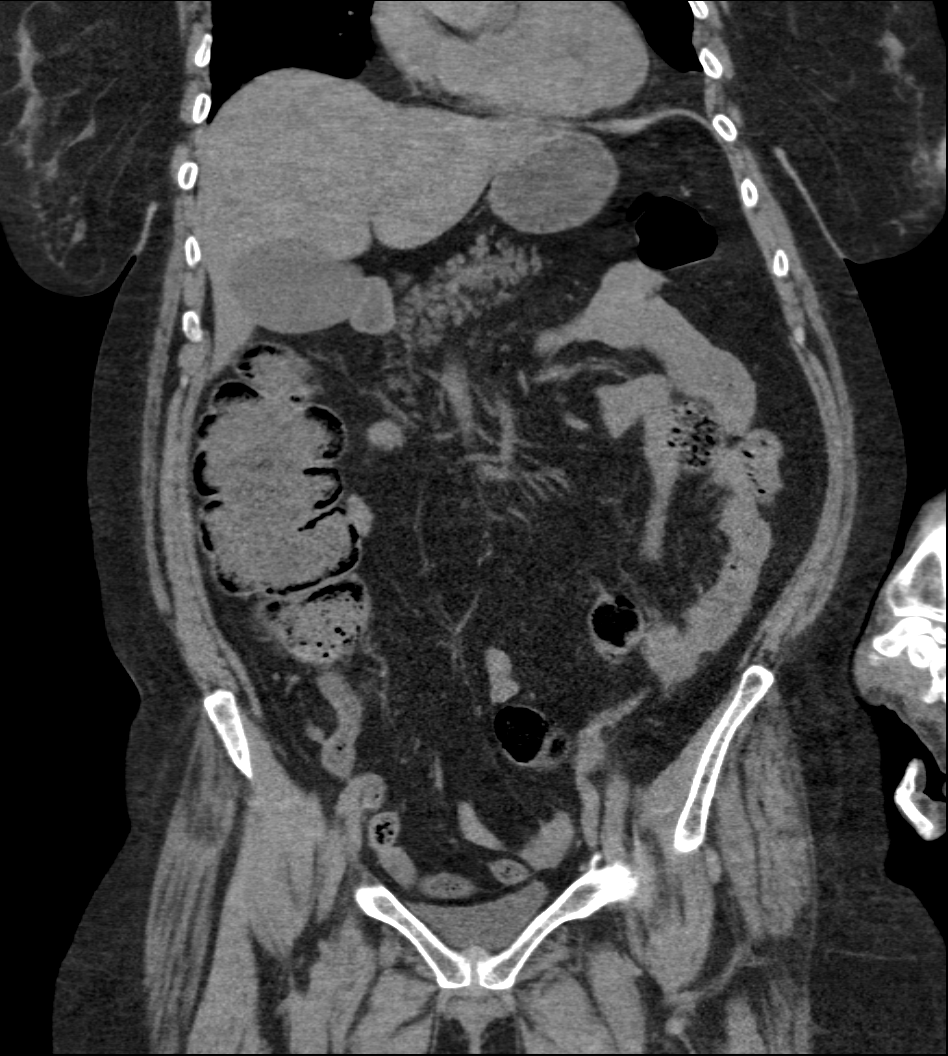
[im 70/126  soft-tissue]
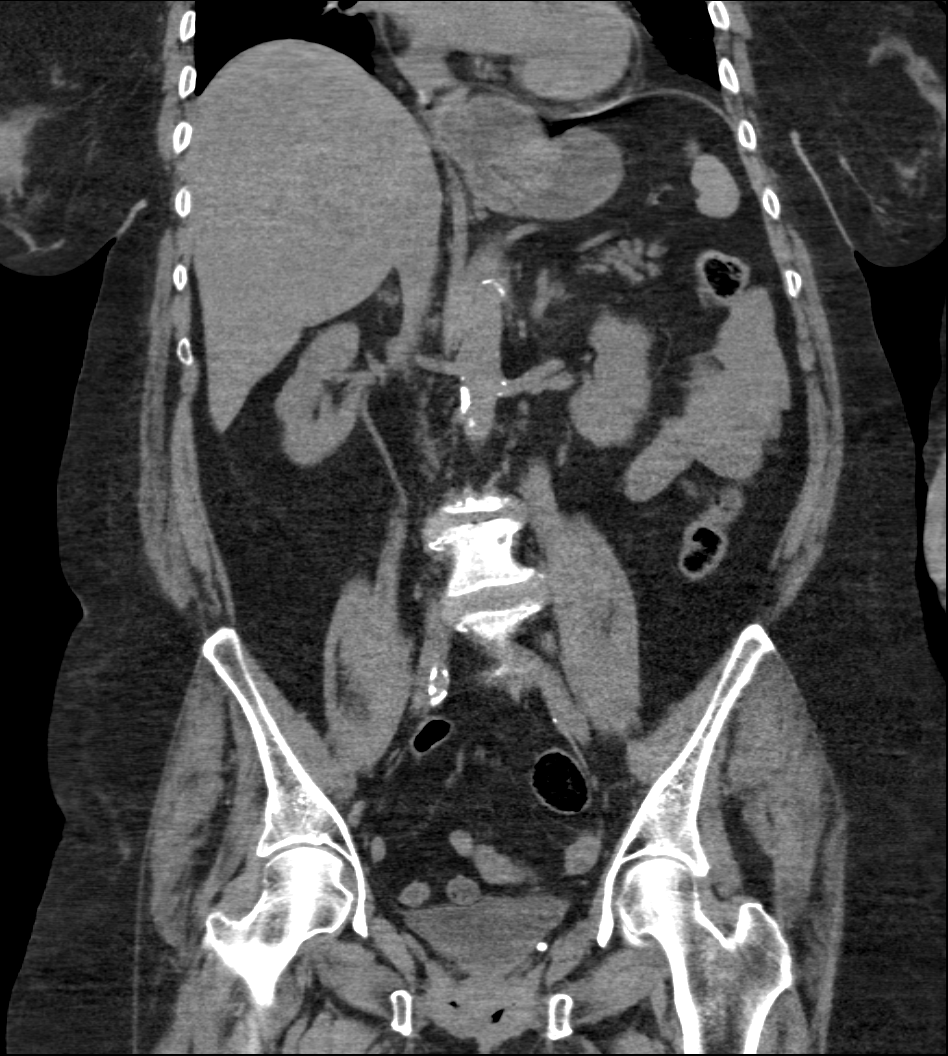

[9 of 46 positions shown; findings below may reference images not displayed]

FINDINGS: Lower chest: There is patchy bibasilar lung atelectatic change.
There is cardiomegaly with a small pericardial effusion. There is a
hiatal hernia with esophageal dilatation and fluid throughout the
visualized esophagus. There are scattered foci of coronary artery
calcification.

Hepatobiliary: No focal liver lesions are identified on this
noncontrast enhanced study. There is no demonstrable perihepatic
fluid. Gallbladder wall is not appreciably thickened. There is no
biliary duct dilatation.

Pancreas: No pancreatic mass or inflammatory focus. There is fatty
infiltration in the pancreas.

Spleen: No splenic lesions are identified.  No perisplenic fluid.

Adrenals/Urinary Tract: Adrenals appear normal bilaterally. There is
a parapelvic regions cyst in the mid to upper left kidney measuring
1.4 x 1.4 cm. There is no hydronephrosis on either side. There is no
renal or ureteral calculus on either side. The urinary bladder is
midline with wall thickness within normal limits.

Stomach/Bowel: There is moderate stool throughout the colon. There
are multiple loops of dilated colon without thickened colonic wall.
The transverse colon measures 7.6 cm in diameter. The ascending
colon measures 8.4 cm in diameter. The: Contour and nares rather
abruptly at the level of the splenic flexure. A focal mass is not
evident in this area by CT. There is no small bowel dilatation or
wall thickening. No mesenteric thickening. No small bowel
obstruction. No free air or portal venous air.

Vascular/Lymphatic: There is atherosclerotic calcification in the
aorta. No abdominal aortic aneurysm. Calcification is also noted in
the common iliac arteries bilaterally. There is no obvious
mesenteric vessel obstruction or high-grade stenosis. There is no
adenopathy in the abdomen or pelvis.

Reproductive: Uterus is absent. There is no pelvic mass or pelvic
fluid collection.

Other: Appendix appears normal. There is no ascites or abscess in
the abdomen or pelvis. There is fat in the right inguinal ring.

Musculoskeletal: There is lower lumbar levoscoliosis. There is
degenerative change in the lumbar spine. There are no appreciable
blastic or lytic bone lesions. There is no intramuscular lesion.
There are surgical clips along the anterior abdominal wall at the
rectus muscles level.
IMPRESSION: Relative dilatation of the colon to the level of the splenic
flexure, where there is a rather abrupt contour change. By CT, there
is no mass in this area. This finding potentially could indicate
focal scarring or possibly residua of old ischemic episode focally
in this area. It may be prudent to consider direct visualization of
this area given this abrupt contour change focally at the splenic
flexure level. There is moderate stool in the colon proximal to the
splenic flexure. Only modest stool is seen distal to this area.

No small bowel dilatation. No small bowel wall thickening is seen on
this study. No free air.

There is a hiatal hernia with dilatation of the visualized distal
esophagus with extensive fluid in the visualized esophagus. Question
chronic reflux change. Direct visualization of the esophagus may be
warranted to assess for possible Barrett's esophagus given this
finding.

Small pericardial effusion. Scattered foci of coronary artery
calcification noted.

Appendix appears normal.  No abscess.  Uterus absent.

No renal or ureteral calculus.  No hydronephrosis.

## 2016-12-08 IMAGING — DX DG CHEST 1V PORT
1 series · 1 of 1 positions shown · non-contrast
Comparison: 05/25/2015 and earlier.

CLINICAL DATA: 59-year-old female with altered mental status.
Recent chest pain. Initial encounter.

EXAM:
PORTABLE CHEST 1 VIEW

[chest ap]
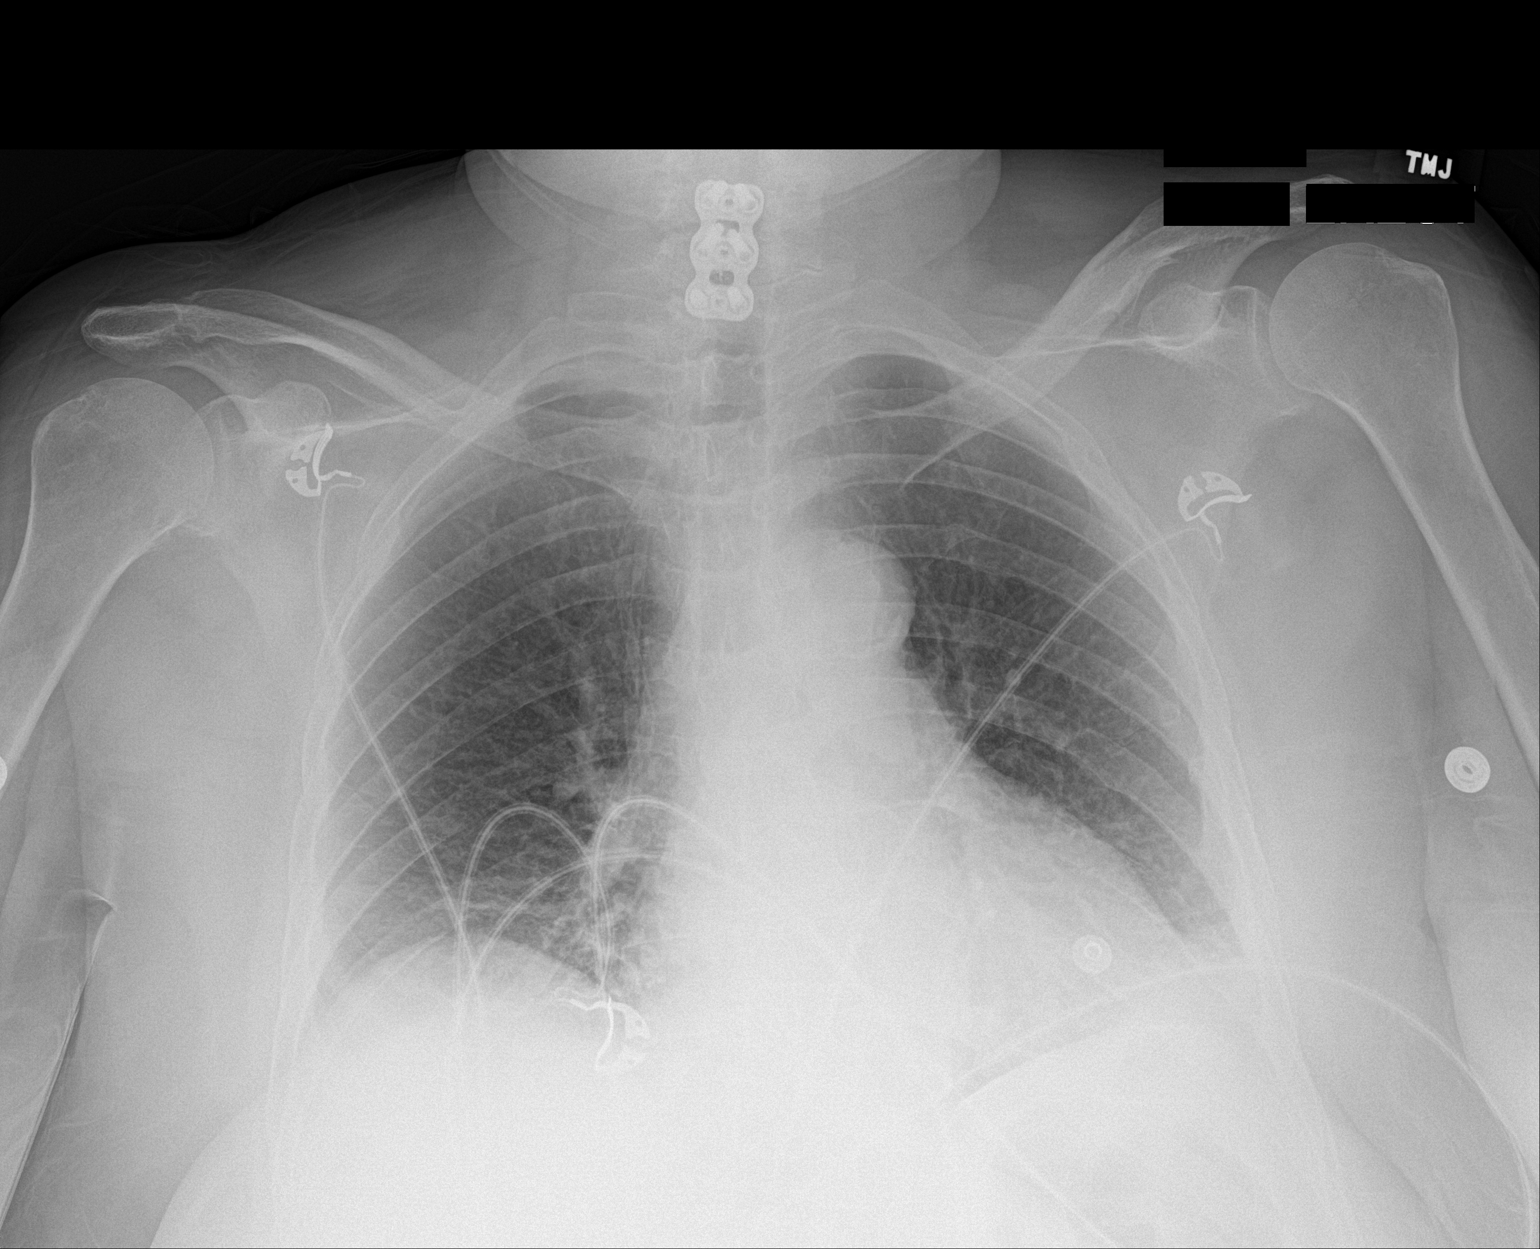

[1 of 1 positions shown; findings below may reference images not displayed]

FINDINGS: Portable AP semi upright view at at 2211 hours. Lower lung volumes.
Mildly increased pulmonary vascularity without overt edema. Stable
cardiac size and mediastinal contours. No pneumothorax. No pleural
effusion or confluent pulmonary opacity identified. Cervical ACDF
hardware re- demonstrated.
IMPRESSION: Lower lung volumes with increased pulmonary vascularity but no acute
pulmonary edema.

## 2016-12-08 IMAGING — CT CT HEAD W/O CM
3 of 8 series · 11 of 47 positions shown, 13 images · non-contrast
Comparison: Cervical spine CT 05/12/2015 and earlier. Head CT
02/14/2015 and earlier.

CLINICAL DATA: 59-year-old female who fell out of bed this morning.
Nausea vomiting, altered mental status, pain. Initial encounter.

EXAM:
CT HEAD WITHOUT CONTRAST
CT CERVICAL SPINE WITHOUT CONTRAST
TECHNIQUE: Multidetector CT imaging of the head and cervical spine was
performed following the standard protocol without intravenous
contrast. Multiplanar CT image reconstructions of the cervical spine
were also generated.

[Series 304: axial · axial · 0.33mm/px · z∈[+56,+196]mm · 8 of 98 slices shown, 10 images]
[im 10/98  brain]
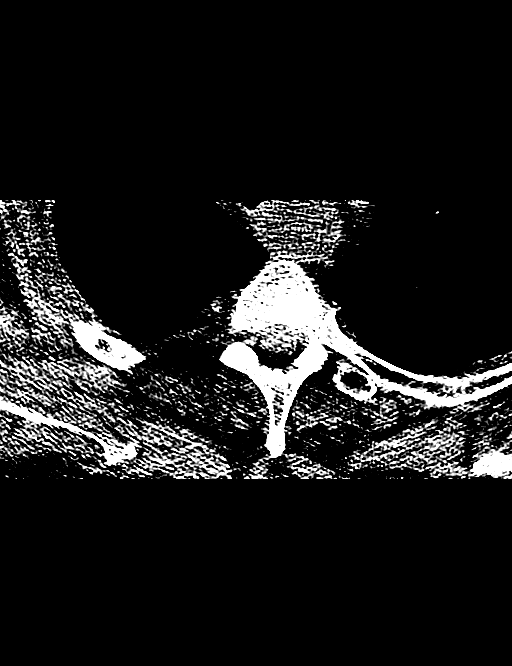
[im 10/98  bone]
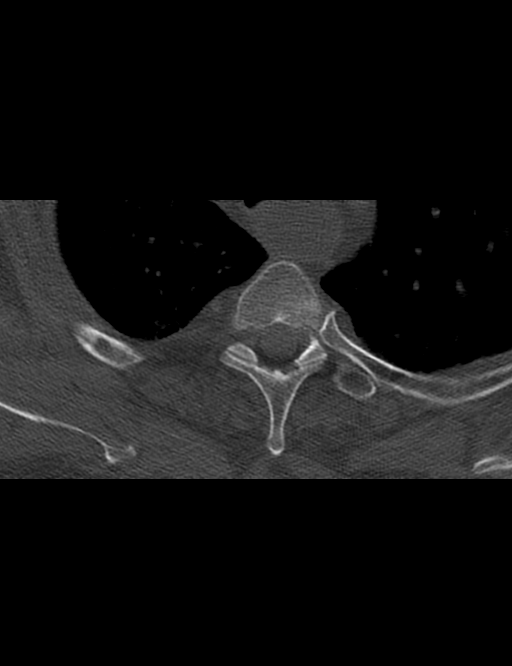
[im 20/98  brain]
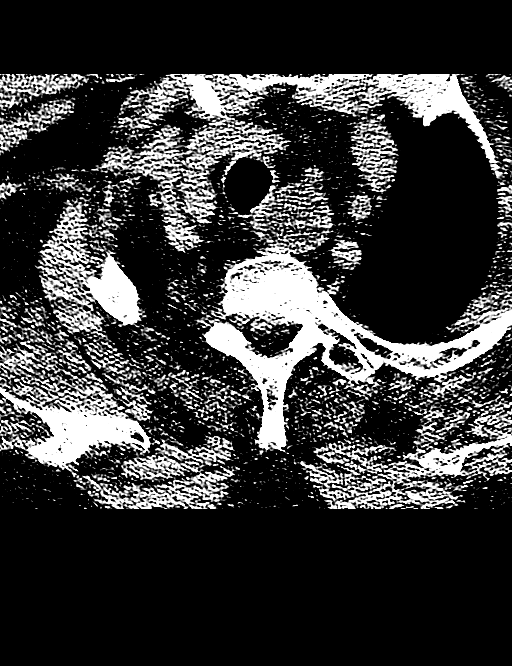
[im 30/98  brain]
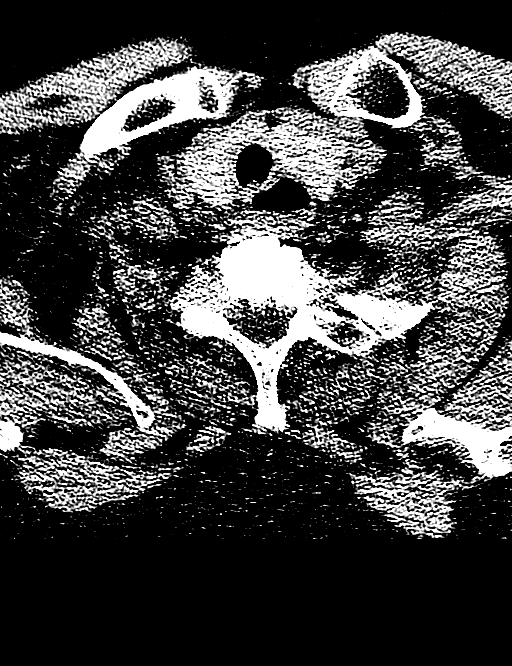
[im 39/98  brain]
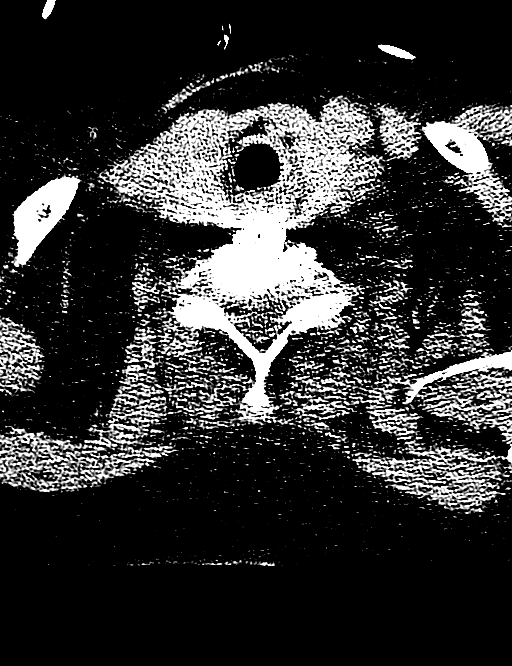
[im 59/98  brain]
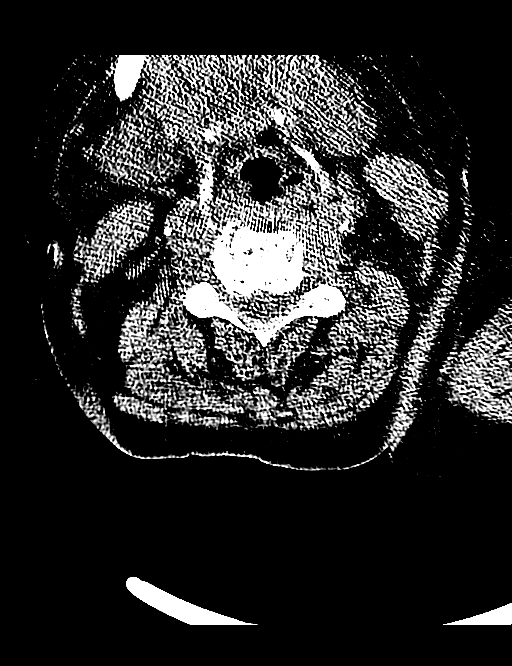
[im 59/98  bone]
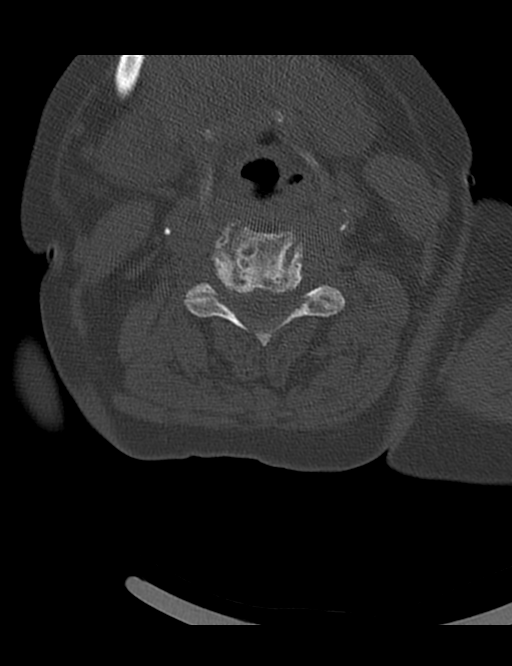
[im 68/98  brain]
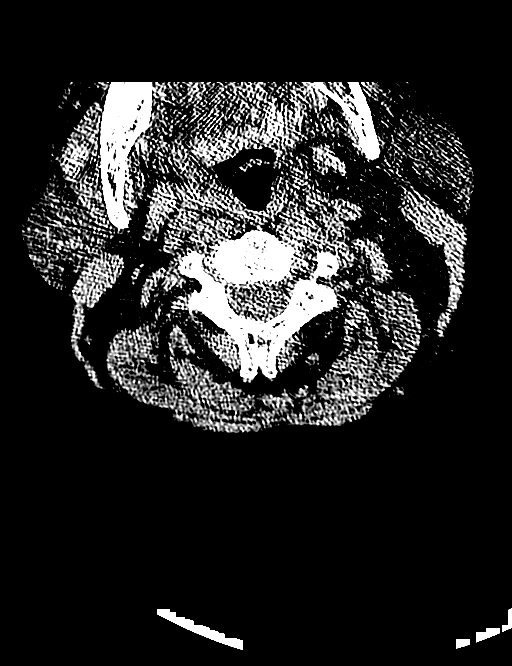
[im 78/98  brain]
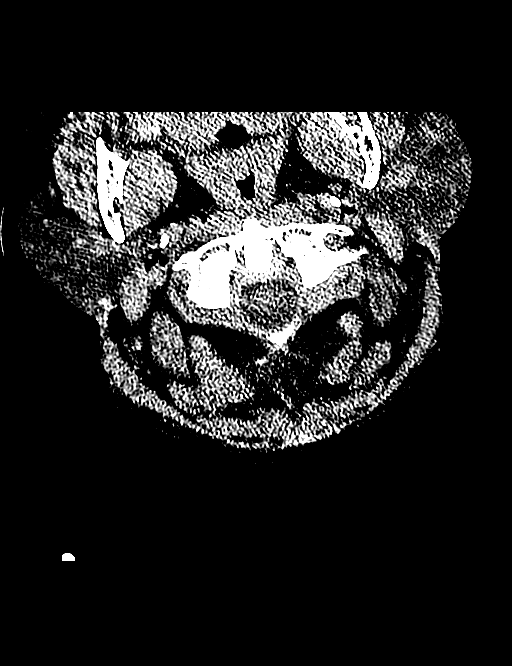
[im 88/98  brain]
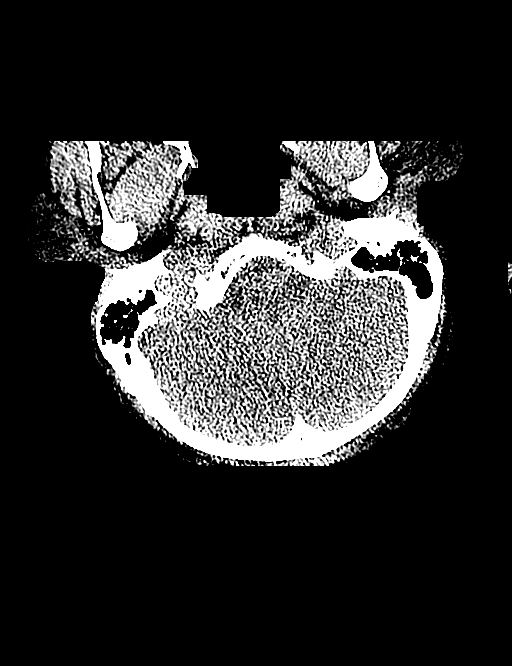

[Series 305: coronal · coronal · 0.33mm/px · 2 of 45 slices shown]
[im 15/45  brain]
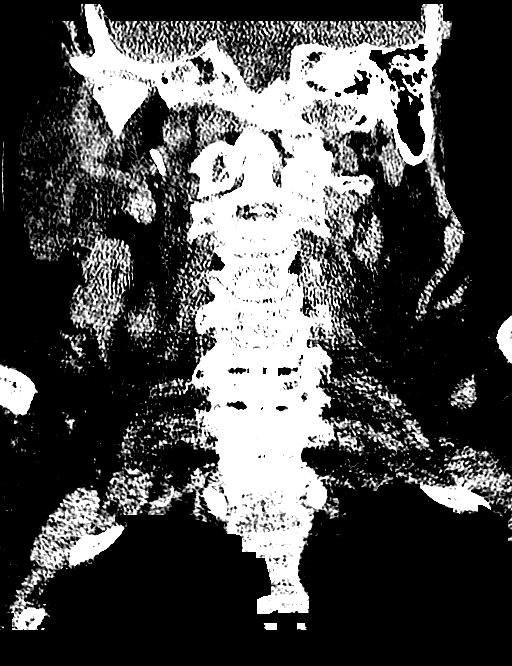
[im 30/45  brain]
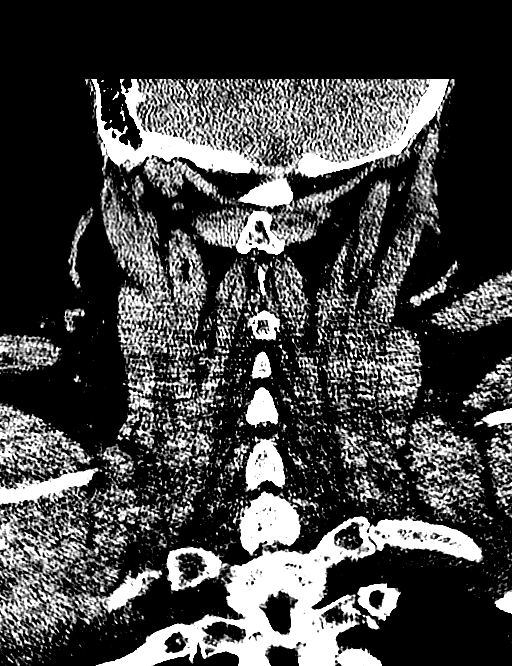

[Series 306: sagittal · sagittal · 0.33mm/px · 1 of 49 slices shown]
[im 25/49  brain]
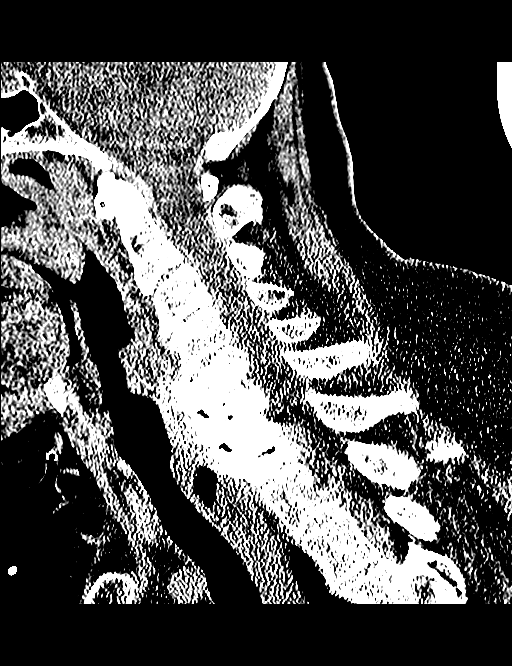

[11 of 47 positions shown; findings below may reference images not displayed]

FINDINGS: CT HEAD FINDINGS

No scalp hematoma. Chronic left forehead scalp soft tissue scarring.
Visualized orbit soft tissues are within normal limits. There might
be a chronic left orbital floor fracture. Visualized paranasal
sinuses and mastoids are clear. Calvarium intact.

Extensive Calcified atherosclerosis at the skull base. Cerebral
volume is normal. No midline shift, ventriculomegaly, mass effect,
evidence of mass lesion, intracranial hemorrhage or evidence of
cortically based acute infarction. Gray-white matter differentiation
is within normal limits throughout the brain. No suspicious
intracranial vascular hyperdensity.

CT CERVICAL SPINE FINDINGS

The patient is status post ACDF since [DATE]-C6 and C6-C7 ACDF
hardware remains in place and appear stable. Stable cervical
vertebral height and alignment with straightening of lordosis.
Cervicothoracic junction alignment is within normal limits.
Bilateral posterior element alignment is within normal limits.
Visualized skull base is intact. No atlanto-occipital dissociation.
Chronic cervical disc and endplate degeneration at C3-C4 and C4-C5.
No acute cervical spine fracture.

Visualized upper thoracic levels appear grossly intact.

Centrilobular and paraseptal emphysema in the lung apices. Negative
noncontrast paraspinal soft tissues. The proximal esophagus is
distended with air in fluid today (series 302, image 73). Visualized
superior mediastinum otherwise stable and negative.
IMPRESSION: 1. Stable and Normal noncontrast CT appearance of the brain.
2. No acute fracture or listhesis identified in the cervical spine.
Ligamentous injury is not excluded.
3. Stable C5-C6 and C6-C7 ACDF.
4. Dilated, fluid-filled proximal esophagus today. Etiology and
significance unclear. See CT Abdomen and Pelvis reported separately.
5. Pulmonary emphysema.

## 2016-12-08 NOTE — Telephone Encounter (Signed)
Patient states we need to call Medicaid insurance to get her approved so she can get her medication - oxycodone (percocet). She wants it sent to [place]? CB # [phone]

## 2016-12-09 IMAGING — DX DG SHOULDER 2+V*R*
2 series · 2 of 2 positions shown · non-contrast
Comparison: None.

CLINICAL DATA: Fall at nursing facility today. Right shoulder pain.

EXAM:
RIGHT SHOULDER - 2+ VIEW

[shoulder grashey]
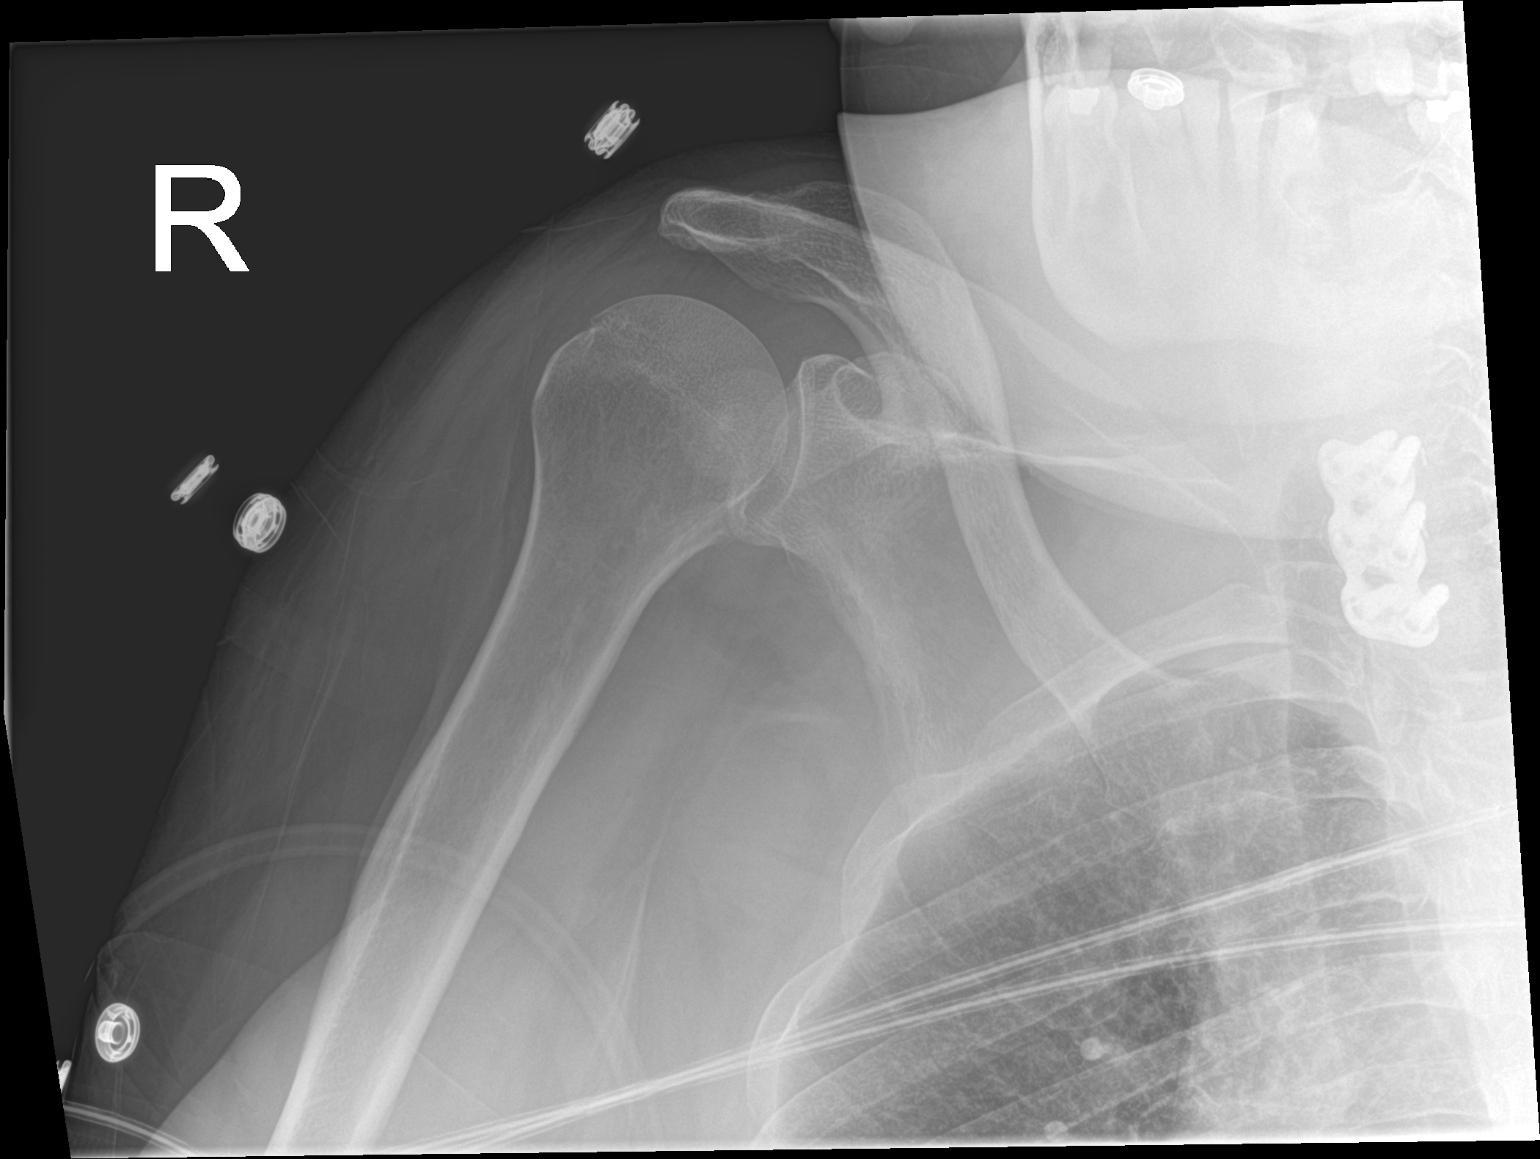

[shoulder y view]
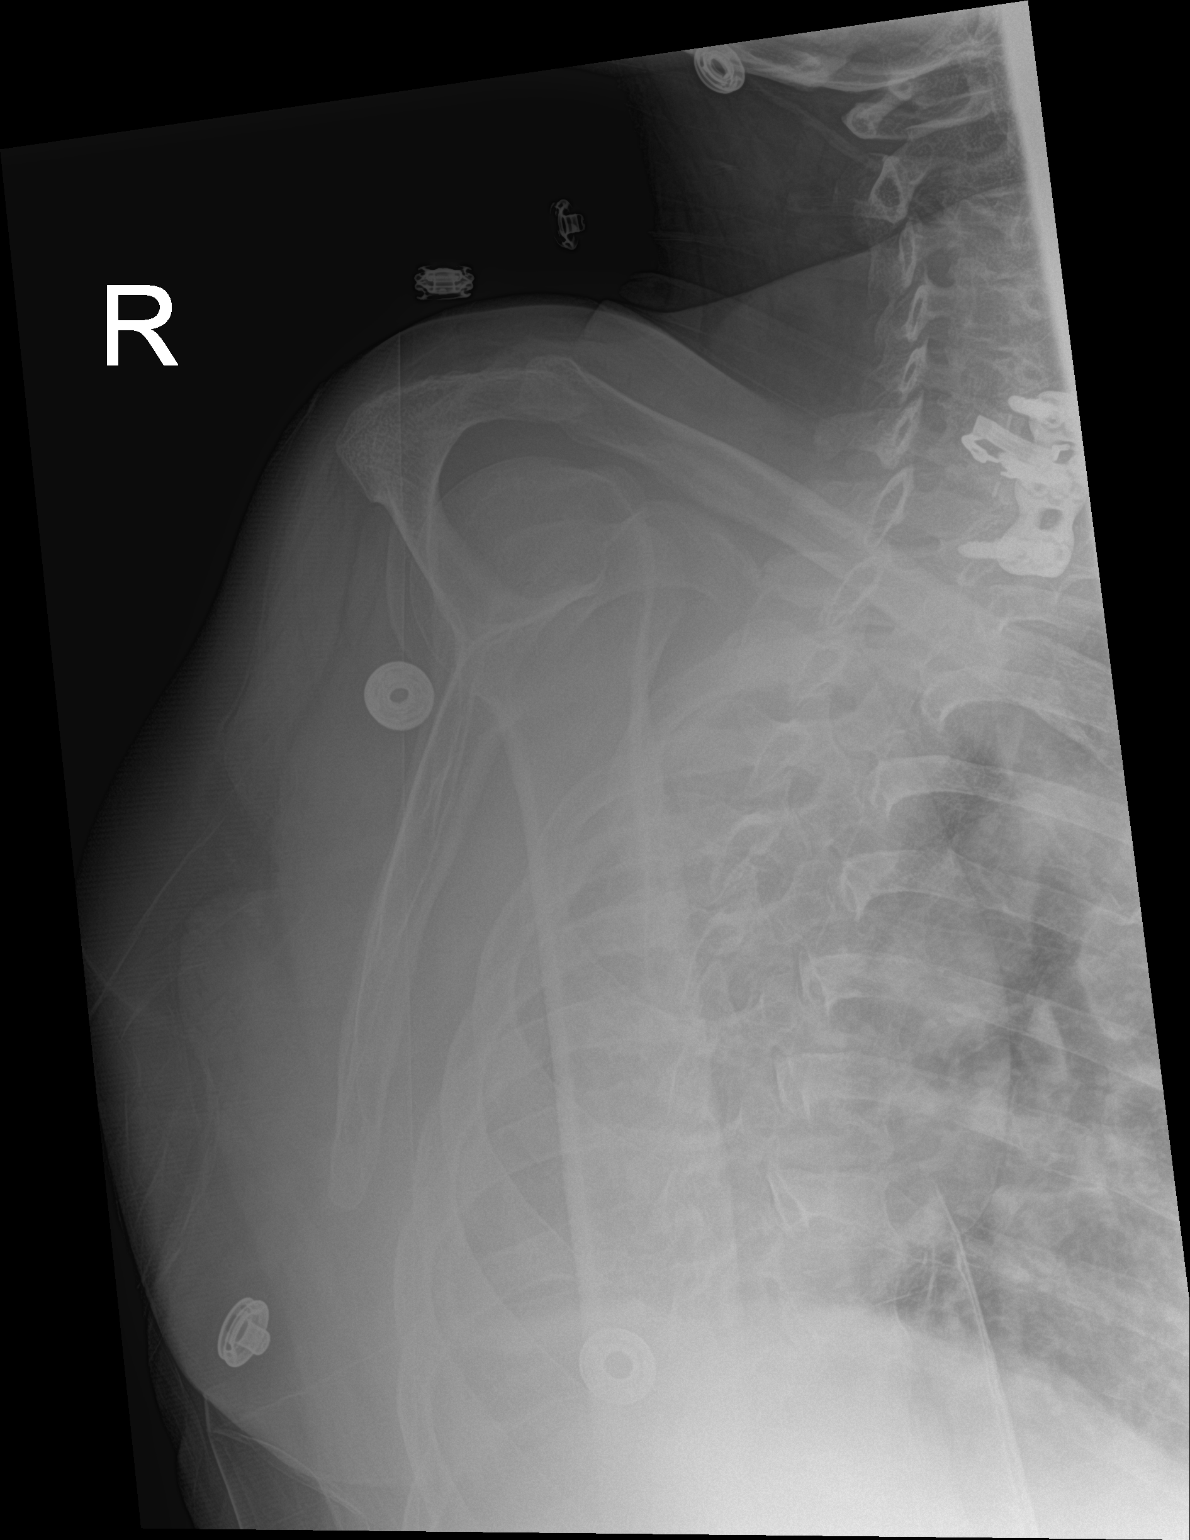

[2 of 2 positions shown; findings below may reference images not displayed]

FINDINGS: No fracture. No dislocation. No significant arthropathic change. No
bone lesion. Soft tissues are unremarkable.
IMPRESSION: Negative.

## 2016-12-10 NOTE — Telephone Encounter (Signed)
Approved and pharmacy aware

## 2016-12-29 ENCOUNTER — Encounter (HOSPITAL_COMMUNITY): Payer: Self-pay

## 2016-12-29 ENCOUNTER — Encounter (HOSPITAL_COMMUNITY): Payer: Self-pay | Admitting: Certified Registered"

## 2016-12-29 ENCOUNTER — Other Ambulatory Visit: Payer: Self-pay

## 2016-12-29 ENCOUNTER — Encounter (HOSPITAL_COMMUNITY): Payer: Self-pay | Admitting: Emergency Medicine

## 2016-12-29 ENCOUNTER — Encounter (HOSPITAL_COMMUNITY)
Admission: RE | Admit: 2016-12-29 | Discharge: 2016-12-29 | Disposition: A | Discharge status: Home / Self Care | Payer: Medicaid Other | Source: Ambulatory Visit | Attending: Neurological Surgery | Admitting: Neurological Surgery

## 2016-12-29 DIAGNOSIS — Z7952 Long term (current) use of systemic steroids: Secondary | ICD-10-CM | POA: Diagnosis not present

## 2016-12-29 DIAGNOSIS — K219 Gastro-esophageal reflux disease without esophagitis: Secondary | ICD-10-CM | POA: Diagnosis not present

## 2016-12-29 DIAGNOSIS — Z720 Tobacco use: Secondary | ICD-10-CM | POA: Insufficient documentation

## 2016-12-29 DIAGNOSIS — I129 Hypertensive chronic kidney disease with stage 1 through stage 4 chronic kidney disease, or unspecified chronic kidney disease: Secondary | ICD-10-CM | POA: Insufficient documentation

## 2016-12-29 DIAGNOSIS — M069 Rheumatoid arthritis, unspecified: Secondary | ICD-10-CM | POA: Insufficient documentation

## 2016-12-29 DIAGNOSIS — Z01818 Encounter for other preprocedural examination: Secondary | ICD-10-CM | POA: Insufficient documentation

## 2016-12-29 DIAGNOSIS — M4802 Spinal stenosis, cervical region: Secondary | ICD-10-CM | POA: Diagnosis not present

## 2016-12-29 DIAGNOSIS — J449 Chronic obstructive pulmonary disease, unspecified: Secondary | ICD-10-CM | POA: Diagnosis not present

## 2016-12-29 DIAGNOSIS — N183 Chronic kidney disease, stage 3 (moderate): Secondary | ICD-10-CM | POA: Diagnosis not present

## 2016-12-29 DIAGNOSIS — E785 Hyperlipidemia, unspecified: Secondary | ICD-10-CM | POA: Insufficient documentation

## 2016-12-29 DIAGNOSIS — Z79899 Other long term (current) drug therapy: Secondary | ICD-10-CM | POA: Insufficient documentation

## 2016-12-29 LAB — CBC WITH DIFFERENTIAL/PLATELET
BASOS PCT: 0 %
Basophils Absolute: 0 10*3/uL (ref 0.0–0.1)
EOS ABS: 0.3 10*3/uL (ref 0.0–0.7)
EOS PCT: 4 %
HCT: 40.9 % (ref 36.0–46.0)
HEMOGLOBIN: 13.5 g/dL (ref 12.0–15.0)
LYMPHS ABS: 2.7 10*3/uL (ref 0.7–4.0)
Lymphocytes Relative: 34 %
MCH: 30.1 pg (ref 26.0–34.0)
MCHC: 33 g/dL (ref 30.0–36.0)
MCV: 91.3 fL (ref 78.0–100.0)
Monocytes Absolute: 0.2 10*3/uL (ref 0.1–1.0)
Monocytes Relative: 3 %
NEUTROS PCT: 59 %
Neutro Abs: 4.6 10*3/uL (ref 1.7–7.7)
PLATELETS: 324 10*3/uL (ref 150–400)
RBC: 4.48 MIL/uL (ref 3.87–5.11)
RDW: 14.3 % (ref 11.5–15.5)
WBC: 7.8 10*3/uL (ref 4.0–10.5)

## 2016-12-29 LAB — TYPE AND SCREEN
ABO/RH(D): B POS
Antibody Screen: NEGATIVE

## 2016-12-29 LAB — BASIC METABOLIC PANEL
Anion gap: 7 (ref 5–15)
BUN: 13 mg/dL (ref 6–20)
CALCIUM: 8.6 mg/dL — AB (ref 8.9–10.3)
CO2: 22 mmol/L (ref 22–32)
CREATININE: 1.26 mg/dL — AB (ref 0.44–1.00)
Chloride: 105 mmol/L (ref 101–111)
GFR calc non Af Amer: 44 mL/min — ABNORMAL LOW (ref >60)
GFR, EST AFRICAN AMERICAN: 51 mL/min — AB (ref >60)
Glucose, Bld: 112 mg/dL — ABNORMAL HIGH (ref 65–99)
Potassium: 3.6 mmol/L (ref 3.5–5.1)
SODIUM: 134 mmol/L — AB (ref 135–145)

## 2016-12-29 LAB — SURGICAL PCR SCREEN
MRSA, PCR: NEGATIVE
STAPHYLOCOCCUS AUREUS: NEGATIVE

## 2016-12-29 LAB — ABO/RH: ABO/RH(D): B POS

## 2016-12-29 LAB — PROTIME-INR
INR: 0.93
PROTHROMBIN TIME: 12.3 s (ref 11.4–15.2)

## 2016-12-29 MED ORDER — CHLORHEXIDINE GLUCONATE CLOTH 2 % EX PADS: 6.0000 | MEDICATED_PAD | Freq: Once | CUTANEOUS | Status: DC

## 2016-12-29 NOTE — Progress Notes (Signed)
PCP:  Cardiologist:  EKG:  Stress test:  ECHO:  Cardiac Cath:  Chest x-ray

## 2016-12-29 NOTE — Progress Notes (Addendum)
PCP: Dr. Kerrie Pleasure  Cardiologist: pt denies  EKG: 09/02/16 in Epic  Stress test: pt denies   ECHO: 06/30/2015 in EPIC  Cardiac Cath: pt denies ever  Chest x-ray: 03/15/16 in Epic  Patient reports using cocaine 12/28/16 and is not a good historian as she is falling asleep between questions.  She does not have her medication list with her but advised that she is not on blood thinners.  I informed her pharmacy would be calling her to go over medications.  Called and spoke with Willeen Cass, NP to get further orders. After speaking with her, Advised the patient that she will be drug tested the day of surgery and if positive her surgery will be cancelled.  She verbalized understanding and said she would not do cocaine between now and her surgery.

## 2016-12-29 NOTE — Pre-Procedure Instructions (Signed)
[name]  12/29/2016      [id number] - [place], [place] [address] Phone: [phone] Fax: [phone]  [place], [place] - [address] [place] [address] Phone: [phone] Fax: [phone]    Your procedure is scheduled on January 06, 2017.  Report to [facility] Admitting at 745 AM.  Call this number if you have problems the morning of surgery:  [phone]   Remember:  Do not eat food or drink liquids after midnight.  Take these medicines the morning of surgery with A SIP OF WATER tylenol-if needed, albuterol inhaler-bring inhaler with you, amlodipine (norvasc), flonase nasal spray, gabapentin (neurontin), hydrocodone-acetaminophen (norco)-if needed for pain, methocarbamol (robaxin), pantoprazole (protonix).   7 days prior to surgery STOP taking any celebrex, Aspirin (unless otherwise instructed by your surgeon), Aleve, Naproxen, Ibuprofen, Motrin, Advil, Goody's, BC's, all herbal medications, fish oil, and all vitamins  Continue all other medications as instructed by your physician except follow the above medication instructions before surgery  Do not wear jewelry, make-up or nail polish.  Do not wear lotions, powders, or perfumes, or deodorant.  Do not shave 48 hours prior to surgery.    Do not bring valuables to the hospital.  [facility] is not responsible for any belongings or valuables.  Contacts, dentures or bridgework may not be worn into surgery.  Leave your suitcase in the car.  After surgery it may be brought to your room.  For patients admitted to the hospital, discharge time will be determined by your treatment team.  Patients discharged the day of surgery will not be allowed to drive home.   Special instructions:   [place]- Preparing For Surgery  Before surgery, you can play an important role. Because skin is not  sterile, your skin needs to be as free of germs as possible. You can reduce the number of germs on your skin by washing with CHG (chlorahexidine gluconate) Soap before surgery.  CHG is an antiseptic cleaner which kills germs and bonds with the skin to continue killing germs even after washing.  Please do not use if you have an allergy to CHG or antibacterial soaps. If your skin becomes reddened/irritated stop using the CHG.  Do not shave (including legs and underarms) for at least 48 hours prior to first CHG shower. It is OK to shave your face.  Please follow these instructions carefully.   1. Shower the NIGHT BEFORE SURGERY and the MORNING OF SURGERY with CHG.   2. If you chose to wash your hair, wash your hair first as usual with your normal shampoo.  3. After you shampoo, rinse your hair and body thoroughly to remove the shampoo.  4. Use CHG as you would any other liquid soap. You can apply CHG directly to the skin and wash gently with a scrungie or a clean washcloth.   5. Apply the CHG Soap to your body ONLY FROM THE NECK DOWN.  Do not use on open wounds or open sores. Avoid contact with your eyes, ears, mouth and genitals (private parts). Wash Face and genitals (private parts)  with your normal soap.  6. Wash thoroughly, paying special attention to the area where your surgery will be performed.  7. Thoroughly rinse your body with warm water from the neck down.  8. DO NOT shower/wash with your normal soap after using and rinsing off the CHG Soap.  9. Pat yourself dry with a CLEAN TOWEL.  10. Wear CLEAN PAJAMAS to bed the night before surgery, wear comfortable clothes the morning of surgery  11. Place CLEAN SHEETS on your bed the night of your first shower and DO NOT SLEEP WITH PETS.    Day of Surgery: Do not apply any deodorants/lotions. Please wear clean clothes to the hospital/surgery center.     Please read over the following fact sheets that you were given. Pain Booklet,  Coughing and Deep Breathing, MRSA Information and Surgical Site Infection Prevention

## 2016-12-30 ENCOUNTER — Inpatient Hospital Stay (HOSPITAL_COMMUNITY): Admission: RE | Admit: 2016-12-30 | Payer: Medicaid Other | Source: Ambulatory Visit
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
## 2017-01-05 MED ORDER — VANCOMYCIN HCL IN DEXTROSE 1-5 GM/200ML-% IV SOLN
1000.0000 mg | INTRAVENOUS | Status: DC
  Filled 2017-01-05: qty 200

## 2017-01-05 MED ORDER — DEXAMETHASONE SODIUM PHOSPHATE 10 MG/ML IJ SOLN: 10.0000 mg | INTRAMUSCULAR | Status: DC

## 2017-01-06 ENCOUNTER — Other Ambulatory Visit: Payer: Self-pay

## 2017-01-06 ENCOUNTER — Ambulatory Visit (HOSPITAL_COMMUNITY): Payer: Medicaid Other

## 2017-01-06 ENCOUNTER — Emergency Department (HOSPITAL_COMMUNITY)
Admission: EM | Admit: 2017-01-06 | Discharge: 2017-01-06 | Disposition: A | Discharge status: Home / Self Care | Payer: Medicaid Other | Attending: Emergency Medicine | Admitting: Emergency Medicine

## 2017-01-06 ENCOUNTER — Encounter (HOSPITAL_COMMUNITY): Payer: Self-pay | Admitting: *Deleted

## 2017-01-06 ENCOUNTER — Ambulatory Visit (HOSPITAL_COMMUNITY)
Admission: RE | Admit: 2017-01-06 | Discharge: 2017-01-06 | Disposition: A | Discharge status: Other Acute Inpatient Hospital | Payer: Medicaid Other | Source: Ambulatory Visit | Attending: Neurological Surgery | Admitting: Neurological Surgery

## 2017-01-06 ENCOUNTER — Emergency Department (HOSPITAL_COMMUNITY): Payer: Medicaid Other

## 2017-01-06 ENCOUNTER — Encounter (HOSPITAL_COMMUNITY): Admission: RE | Payer: Self-pay | Source: Ambulatory Visit | Attending: Neurological Surgery

## 2017-01-06 DIAGNOSIS — F17210 Nicotine dependence, cigarettes, uncomplicated: Secondary | ICD-10-CM | POA: Diagnosis not present

## 2017-01-06 DIAGNOSIS — R079 Chest pain, unspecified: Secondary | ICD-10-CM | POA: Insufficient documentation

## 2017-01-06 DIAGNOSIS — Z79899 Other long term (current) drug therapy: Secondary | ICD-10-CM | POA: Diagnosis not present

## 2017-01-06 DIAGNOSIS — K449 Diaphragmatic hernia without obstruction or gangrene: Secondary | ICD-10-CM | POA: Insufficient documentation

## 2017-01-06 DIAGNOSIS — I129 Hypertensive chronic kidney disease with stage 1 through stage 4 chronic kidney disease, or unspecified chronic kidney disease: Secondary | ICD-10-CM | POA: Diagnosis not present

## 2017-01-06 DIAGNOSIS — R05 Cough: Secondary | ICD-10-CM | POA: Insufficient documentation

## 2017-01-06 DIAGNOSIS — J449 Chronic obstructive pulmonary disease, unspecified: Secondary | ICD-10-CM | POA: Diagnosis not present

## 2017-01-06 DIAGNOSIS — R0602 Shortness of breath: Secondary | ICD-10-CM | POA: Diagnosis present

## 2017-01-06 DIAGNOSIS — N183 Chronic kidney disease, stage 3 (moderate): Secondary | ICD-10-CM | POA: Diagnosis not present

## 2017-01-06 DIAGNOSIS — R0789 Other chest pain: Secondary | ICD-10-CM | POA: Insufficient documentation

## 2017-01-06 LAB — BASIC METABOLIC PANEL
ANION GAP: 8 (ref 5–15)
BUN: 10 mg/dL (ref 6–20)
CHLORIDE: 107 mmol/L (ref 101–111)
CO2: 23 mmol/L (ref 22–32)
Calcium: 8.9 mg/dL (ref 8.9–10.3)
Creatinine, Ser: 1 mg/dL (ref 0.44–1.00)
GFR calc Af Amer: >60 mL/min (ref >60)
GFR calc non Af Amer: 59 mL/min — ABNORMAL LOW (ref >60)
GLUCOSE: 112 mg/dL — AB (ref 65–99)
POTASSIUM: 4 mmol/L (ref 3.5–5.1)
Sodium: 138 mmol/L (ref 135–145)

## 2017-01-06 LAB — CBC
HEMATOCRIT: 41.2 % (ref 36.0–46.0)
HEMOGLOBIN: 13.7 g/dL (ref 12.0–15.0)
MCH: 30.1 pg (ref 26.0–34.0)
MCHC: 33.3 g/dL (ref 30.0–36.0)
MCV: 90.5 fL (ref 78.0–100.0)
Platelets: 343 10*3/uL (ref 150–400)
RBC: 4.55 MIL/uL (ref 3.87–5.11)
RDW: 14.2 % (ref 11.5–15.5)
WBC: 5.7 10*3/uL (ref 4.0–10.5)

## 2017-01-06 LAB — I-STAT TROPONIN, ED: Troponin i, poc: 0.02 ng/mL (ref 0.00–0.08)

## 2017-01-06 SURGERY — POSTERIOR CERVICAL FUSION/FORAMINOTOMY LEVEL 2
Anesthesia: General

## 2017-01-06 MED ORDER — HYDROCODONE-ACETAMINOPHEN 5-325 MG PO TABS: 1.0000 | ORAL_TABLET | Freq: Three times a day (TID) | ORAL | 0 refills | Status: DC | PRN

## 2017-01-06 MED ORDER — OXYCODONE-ACETAMINOPHEN 5-325 MG PO TABS
1.0000 | ORAL_TABLET | Freq: Once | ORAL | Status: AC
  Administered 2017-01-06: 1 via ORAL
  Filled 2017-01-06: qty 1

## 2017-01-06 MED ORDER — IPRATROPIUM-ALBUTEROL 0.5-2.5 (3) MG/3ML IN SOLN
3.0000 mL | Freq: Once | RESPIRATORY_TRACT | Status: AC
Start: 2017-01-06 — End: 2017-01-06
  Administered 2017-01-06: 3 mL via RESPIRATORY_TRACT
  Filled 2017-01-06: qty 3

## 2017-01-06 NOTE — ED Triage Notes (Addendum)
Pt sent here from pre-op for R chest pain that increases with inspiration, cough and palpation.  Was d/t have surgery to day to "remove some bone" from pine.   Used cocaine on Tues because she ran out of perocet 10's.

## 2017-01-06 NOTE — Discharge Instructions (Signed)
Avoid cocaine.  You do have some coronary artery disease seen on CT scan, so you will need to f/u with cardiology.  However the pain today does not seem to be coming from your heart.  Call Dr. Adah Salvage office tomorrow to reschedule surgery.

## 2017-01-06 NOTE — ED Notes (Signed)
Patient transported to CT 
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
## 2017-01-06 NOTE — Progress Notes (Signed)
Pt arrived to pre-op complaining of "extreme chest pain".  Anesthesiologist was notified and RN advised to take patient to ED.  Pt complaining further of R flank pain, chest swelling, and hemoptysis.  Pt transported to ED via wheelchair on monitor.
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
## 2017-01-08 IMAGING — NM NM PULMONARY VENT & PERF
16 series · 16 of 16 positions shown · non-contrast
Comparison: Chest radiograph 06/29/2015

CLINICAL DATA: Chest pain, dizziness and hypertension.

EXAM:
NUCLEAR MEDICINE VENTILATION - PERFUSION LUNG SCAN
TECHNIQUE: Ventilation images were obtained in multiple projections using
inhaled aerosol 1c-22m DTPA. Perfusion images were obtained in
multiple projections after intravenous injection of 1c-22m MAA.
RADIOPHARMACEUTICALS:  33.2 mCi Rechnetium-DDm DTPA aerosol
inhalation and 4.29 mCi Rechnetium-DDm MAA IV

[Series 1: ant/post vent · 4.14mm/px · 1 of 1 slices shown (1 of 2)]
[im 1/1]
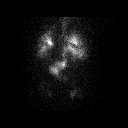

[Series 1: ant/post vent · 4.14mm/px · 1 of 1 slices shown (2 of 2)]
[im 1/1]
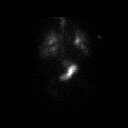

[Series 2: lao/rpo vent · 4.14mm/px · 1 of 1 slices shown (1 of 2)]
[im 1/1  full-range]
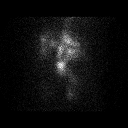

[Series 2: lao/rpo vent · 4.14mm/px · 1 of 1 slices shown (2 of 2)]
[im 1/1]
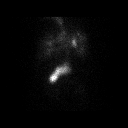

[Series 3: lpo/rao vent · 4.14mm/px · 1 of 1 slices shown (1 of 2)]
[im 1/1  full-range]
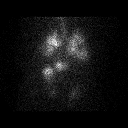

[Series 3: lpo/rao vent · 4.14mm/px · 1 of 1 slices shown (2 of 2)]
[im 1/1]
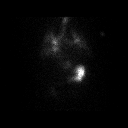

[Series 4: lt lat/rt lat vent · 4.14mm/px · 1 of 1 slices shown (1 of 2)]
[im 1/1]
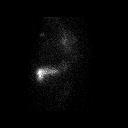

[Series 4: lt lat/rt lat vent · 4.14mm/px · 1 of 1 slices shown (2 of 2)]
[im 1/1]
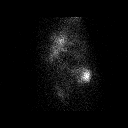

[Series 5: lt lat/rt lat perf · 4.14mm/px · 1 of 1 slices shown (1 of 2)]
[im 1/1]
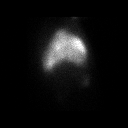

[Series 5: lt lat/rt lat perf · 4.14mm/px · 1 of 1 slices shown (2 of 2)]
[im 1/1]
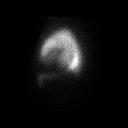

[Series 6: lpo/rao perf · 4.14mm/px · 1 of 1 slices shown (1 of 2)]
[im 1/1]
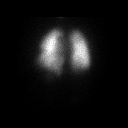

[Series 6: lpo/rao perf · 4.14mm/px · 1 of 1 slices shown (2 of 2)]
[im 1/1]
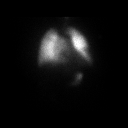

[Series 7: ant/post perf · 4.14mm/px · 1 of 1 slices shown (1 of 2)]
[im 1/1]
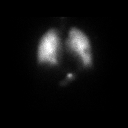

[Series 7: ant/post perf · 4.14mm/px · 1 of 1 slices shown (2 of 2)]
[im 1/1]
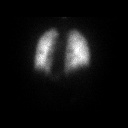

[Series 8: lao/rpo perf · 4.14mm/px · 1 of 1 slices shown (1 of 2)]
[im 1/1]
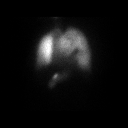

[Series 8: lao/rpo perf · 4.14mm/px · 1 of 1 slices shown (2 of 2)]
[im 1/1]
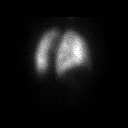

[16 of 16 positions shown; findings below may reference images not displayed]

FINDINGS: Ventilation: Ventilatory images demonstrate heterogeneous
distribution of radiotracer throughout both lungs with pooling of
radiotracer in the hilar regions bilaterally.

Perfusion: There is a small wedge-shaped defect in the left anterior
lung seen on the perfusion images. Given the diffusely patchy
appearance of the lung parenchyma on ventilation images, it is
difficult to determine whether this is a matched or unmatched
defect.
IMPRESSION: Indeterminate ventilation perfusion scan with wedge shaped defect in
the left anterior lung. It is difficult to determine whether this is
a matched or unmatched defect because of diffusely patchy appearance
of the lungs on ventilation images.

## 2017-01-08 IMAGING — CR DG CHEST 1V PORT
1 series · 1 of 1 positions shown · non-contrast
Comparison: 05/29/2015

CLINICAL DATA: Chest pain and hypotension today.

EXAM:
PORTABLE CHEST 1 VIEW

[AP]
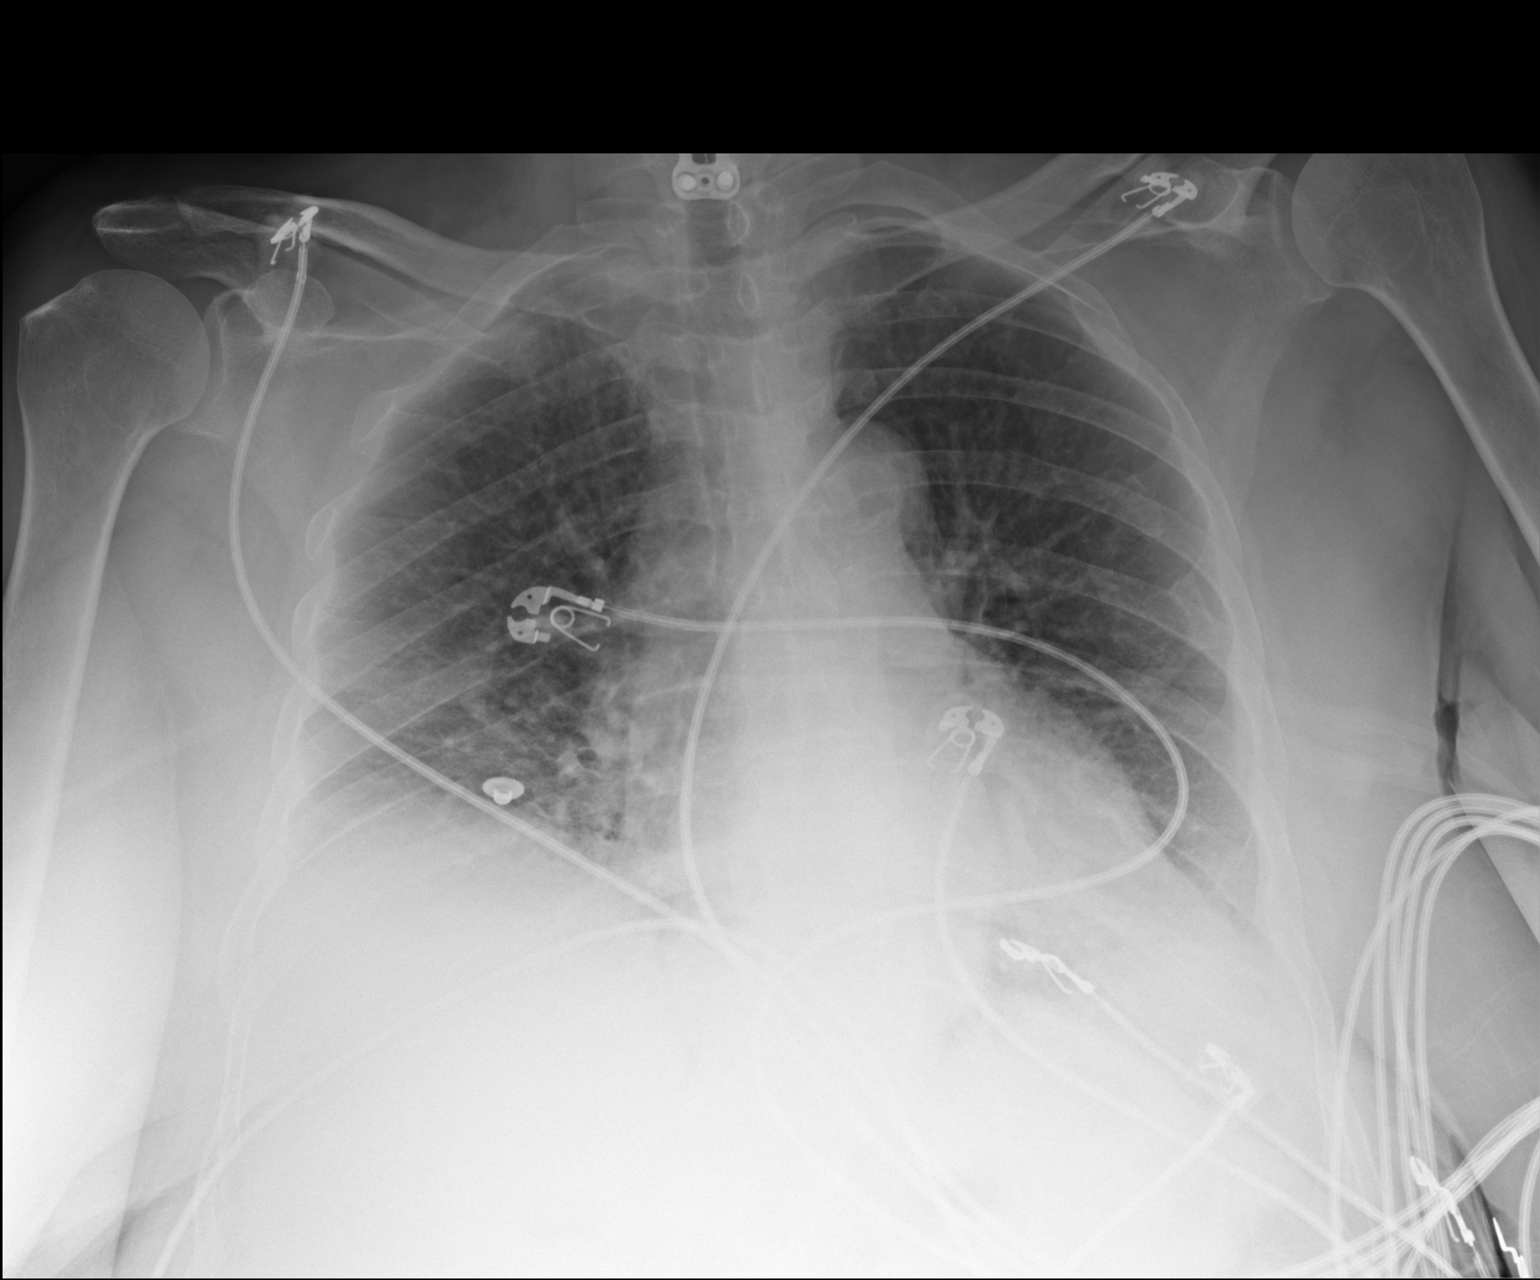

[1 of 1 positions shown; findings below may reference images not displayed]

FINDINGS: Normal heart size and pulmonary vascularity. Hazy opacities in the
right lung base may represent focal pneumonia or atelectasis. No
blunting of costophrenic angles. No pneumothorax. Calcification of
the aorta. Postoperative changes in the cervical spine.
IMPRESSION: Focal opacity in the right lung base may represent atelectasis or
pneumonia.

## 2017-01-10 ENCOUNTER — Ambulatory Visit: Payer: Medicaid Other | Admitting: Interventional Cardiology

## 2017-01-12 ENCOUNTER — Encounter: Payer: Self-pay | Admitting: Interventional Cardiology

## 2017-02-16 ENCOUNTER — Encounter: Payer: Self-pay | Admitting: Interventional Cardiology

## 2017-02-18 IMAGING — DX DG CHEST 2V
2 series · 2 of 2 positions shown · non-contrast
Comparison: 06/29/2015

CLINICAL DATA: Central chest pain and to the left chest for 3 days.

EXAM:
CHEST  2 VIEW

[w chest pa]
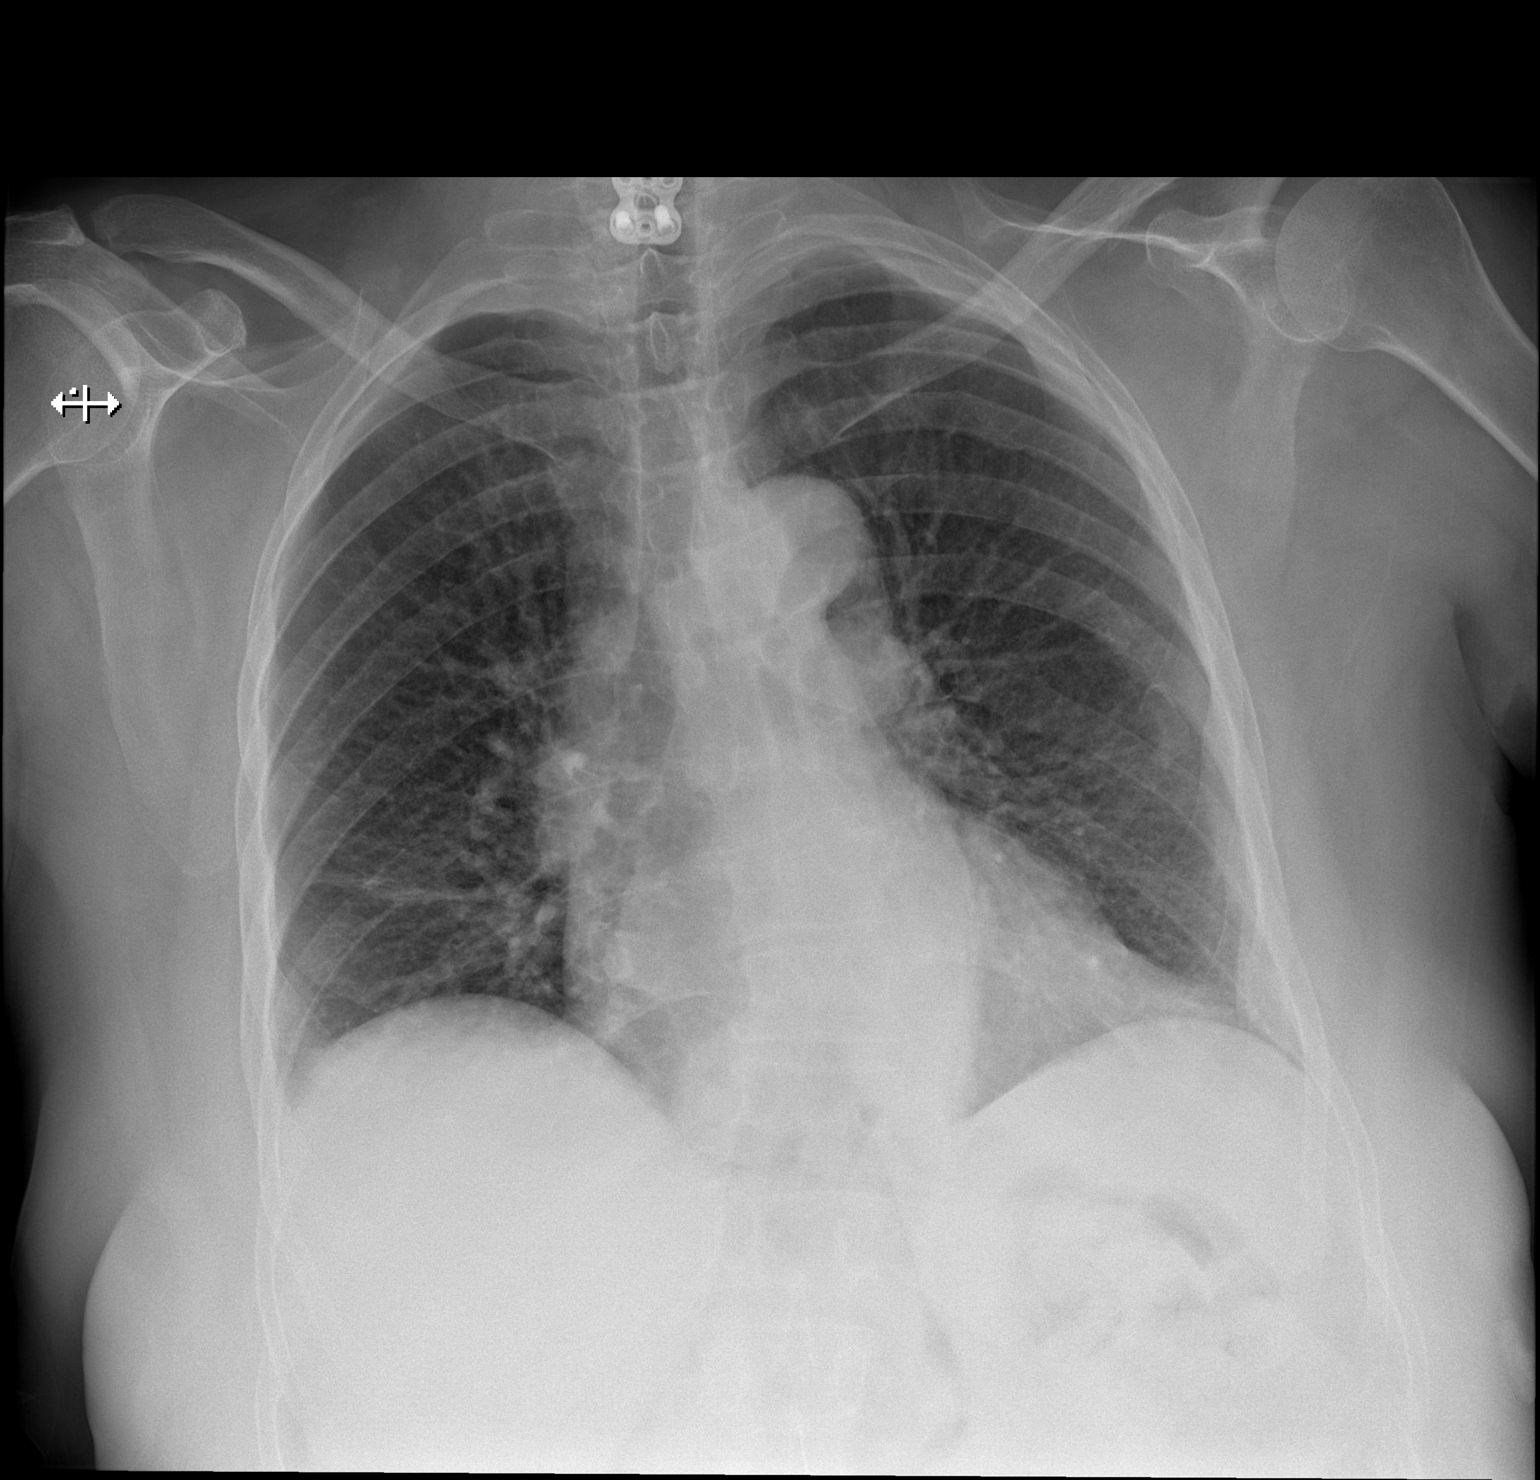

[w chest lat]
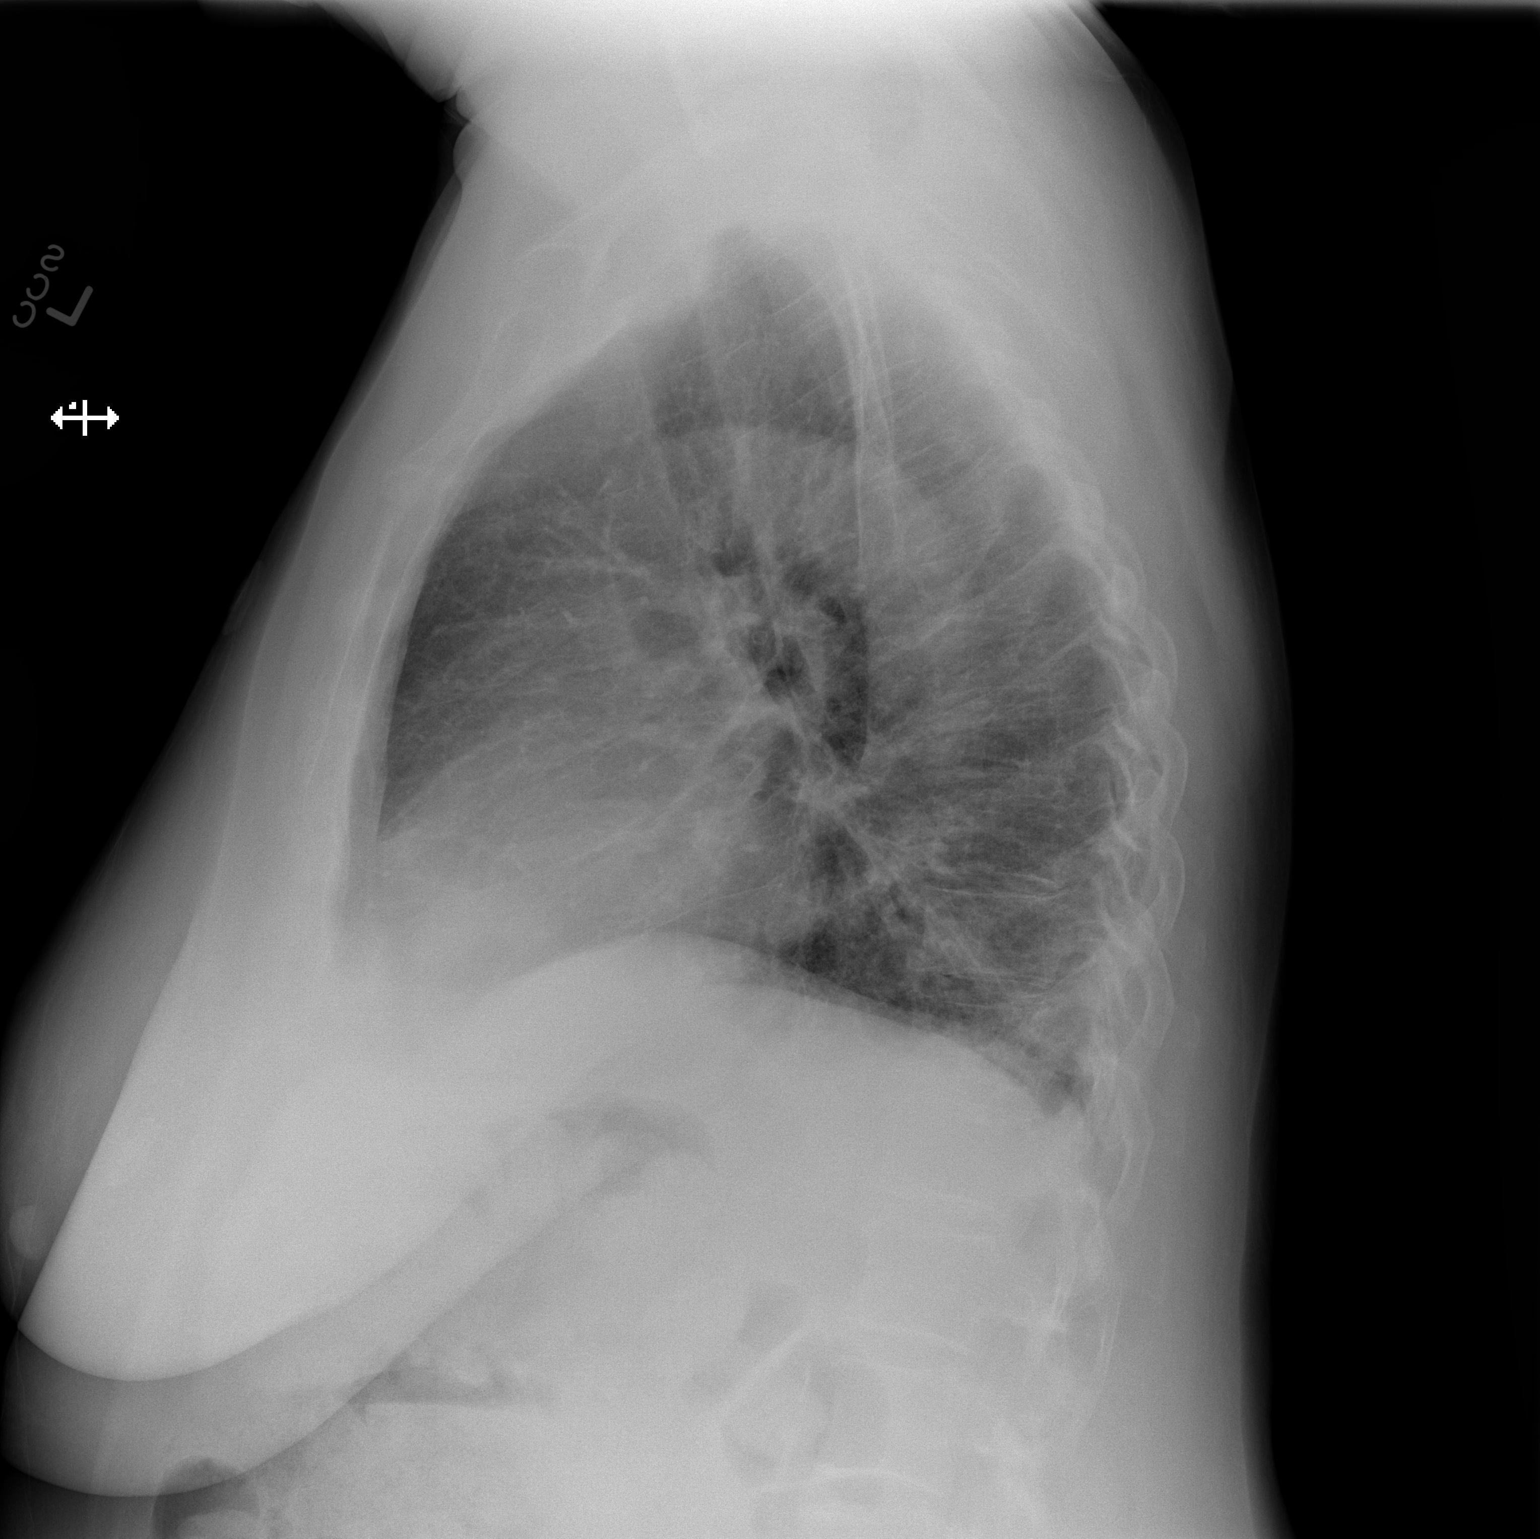

[2 of 2 positions shown; findings below may reference images not displayed]

FINDINGS: Linear areas of atelectasis in the lung bases bilaterally. Heart is
normal size. No effusions. No acute bony abnormality.
IMPRESSION: Bibasilar atelectasis.

## 2017-02-23 ENCOUNTER — Encounter: Payer: Self-pay | Admitting: Interventional Cardiology

## 2017-02-23 ENCOUNTER — Ambulatory Visit (INDEPENDENT_AMBULATORY_CARE_PROVIDER_SITE_OTHER): Payer: Medicaid Other | Admitting: Interventional Cardiology

## 2017-02-23 VITALS — BP 134/88 | HR 104 | Ht 66.5 in | Wt 164.1 lb

## 2017-02-23 DIAGNOSIS — I2584 Coronary atherosclerosis due to calcified coronary lesion: Secondary | ICD-10-CM

## 2017-02-23 DIAGNOSIS — I10 Essential (primary) hypertension: Secondary | ICD-10-CM

## 2017-02-23 DIAGNOSIS — I2510 Atherosclerotic heart disease of native coronary artery without angina pectoris: Secondary | ICD-10-CM | POA: Diagnosis not present

## 2017-02-23 DIAGNOSIS — Z01810 Encounter for preprocedural cardiovascular examination: Secondary | ICD-10-CM

## 2017-02-23 DIAGNOSIS — F1410 Cocaine abuse, uncomplicated: Secondary | ICD-10-CM

## 2017-02-23 NOTE — Patient Instructions (Addendum)
Medication Instructions:  Your physician recommends that you continue on your current medications as directed. Please refer to the Current Medication list given to you today.   Labwork: None ordered  Testing/Procedures: None ordered  Follow-Up: Your physician wants you to follow-up AS NEEDED   Any Other Special Instructions Will Be Listed Below (If Applicable).  No further testing needed prior to surgery   If you need a refill on your cardiac medications before your next appointment, please call your pharmacy.
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
## 2017-03-08 ENCOUNTER — Emergency Department (HOSPITAL_COMMUNITY): Payer: Medicaid Other

## 2017-03-08 ENCOUNTER — Emergency Department (HOSPITAL_COMMUNITY)
Admission: EM | Admit: 2017-03-08 | Discharge: 2017-03-08 | Disposition: A | Discharge status: Home / Self Care | Payer: Medicaid Other | Attending: Emergency Medicine | Admitting: Emergency Medicine

## 2017-03-08 ENCOUNTER — Encounter (HOSPITAL_COMMUNITY): Payer: Self-pay | Admitting: Emergency Medicine

## 2017-03-08 DIAGNOSIS — G8929 Other chronic pain: Secondary | ICD-10-CM

## 2017-03-08 DIAGNOSIS — F1410 Cocaine abuse, uncomplicated: Secondary | ICD-10-CM | POA: Diagnosis not present

## 2017-03-08 DIAGNOSIS — J449 Chronic obstructive pulmonary disease, unspecified: Secondary | ICD-10-CM | POA: Insufficient documentation

## 2017-03-08 DIAGNOSIS — I129 Hypertensive chronic kidney disease with stage 1 through stage 4 chronic kidney disease, or unspecified chronic kidney disease: Secondary | ICD-10-CM | POA: Diagnosis not present

## 2017-03-08 DIAGNOSIS — N183 Chronic kidney disease, stage 3 (moderate): Secondary | ICD-10-CM | POA: Insufficient documentation

## 2017-03-08 DIAGNOSIS — F17210 Nicotine dependence, cigarettes, uncomplicated: Secondary | ICD-10-CM | POA: Insufficient documentation

## 2017-03-08 DIAGNOSIS — M542 Cervicalgia: Secondary | ICD-10-CM | POA: Diagnosis not present

## 2017-03-08 DIAGNOSIS — Z79899 Other long term (current) drug therapy: Secondary | ICD-10-CM | POA: Diagnosis not present

## 2017-03-08 MED ORDER — CYCLOBENZAPRINE HCL 10 MG PO TABS: 10.0000 mg | ORAL_TABLET | Freq: Every evening | ORAL | 0 refills | Status: DC | PRN

## 2017-03-08 MED ORDER — OXYCODONE-ACETAMINOPHEN 5-325 MG PO TABS
1.0000 | ORAL_TABLET | Freq: Once | ORAL | Status: AC
  Administered 2017-03-08: 1 via ORAL
  Filled 2017-03-08: qty 1

## 2017-03-08 MED ORDER — KETOROLAC TROMETHAMINE 15 MG/ML IJ SOLN
15.0000 mg | Freq: Once | INTRAMUSCULAR | Status: AC
  Administered 2017-03-08: 15 mg via INTRAMUSCULAR
  Filled 2017-03-08: qty 1
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
## 2017-03-08 NOTE — Discharge Instructions (Signed)
Please follow up with Dr. Ronnald Ramp regarding your chronic pain Take muscle relaxer (Flexeril) as needed for muscle pain or spasms

## 2017-03-08 NOTE — ED Triage Notes (Addendum)
Per [facility] patient from home for neck and back. Had fusion on neck in Dec 2018 and been having pain since. Called doctor's office and they are "going to reschedule her surgery" but never told a date for surgery.  Vitals: 138/90, 98% on room air, 91%, 18R.

## 2017-03-08 NOTE — ED Notes (Signed)
Patient transported to X-ray 

## 2017-03-24 ENCOUNTER — Telehealth: Payer: Self-pay | Admitting: Interventional Cardiology

## 2017-03-24 NOTE — Telephone Encounter (Signed)
New Message   Pt c/o of Chest Pain: STAT if CP now or developed within 24 hours  1. Are you having CP right now? Yes 2. Are you experiencing any other symptoms (ex. SOB, nausea, vomiting, sweating)? No symptoms  3. How long have you been experiencing CP? 30 minutes   4. Is your CP continuous or coming and going? Coming and going   5. Have you taken Nitroglycerin? No   ?

## 2017-03-24 NOTE — Telephone Encounter (Signed)
Pt called because she is having chest pain again this morning the pain comes and goes. This morning the pain lasted lasted for 30 minutes. Pt denies chest pain now. Pt was seen by Dr Irish Lack in January 2019 for the same chest pain,. This time pt has some SOB when she had the CP. Pt  Denies having the pain radiated to neck nor left arm. Pt does not have a prescription for NTG for CP. Pt  would like to be seen. An appointment was made with Dr. Garald Balding on 04/07/17 at 8:40 AM. Pt is aware to go to the ER if symptoms increased and she feels she need to be seen ASAP. Pt verbalized understanding.
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
## 2017-04-07 ENCOUNTER — Encounter: Payer: Self-pay | Admitting: Interventional Cardiology

## 2017-04-07 ENCOUNTER — Ambulatory Visit (INDEPENDENT_AMBULATORY_CARE_PROVIDER_SITE_OTHER): Payer: Medicaid Other | Admitting: Interventional Cardiology

## 2017-04-07 ENCOUNTER — Encounter (INDEPENDENT_AMBULATORY_CARE_PROVIDER_SITE_OTHER): Payer: Self-pay

## 2017-04-07 VITALS — BP 116/64 | HR 93 | Ht 66.5 in | Wt 177.8 lb

## 2017-04-07 DIAGNOSIS — I2510 Atherosclerotic heart disease of native coronary artery without angina pectoris: Secondary | ICD-10-CM | POA: Diagnosis not present

## 2017-04-07 DIAGNOSIS — I2584 Coronary atherosclerosis due to calcified coronary lesion: Secondary | ICD-10-CM

## 2017-04-07 DIAGNOSIS — F1410 Cocaine abuse, uncomplicated: Secondary | ICD-10-CM

## 2017-04-07 DIAGNOSIS — I10 Essential (primary) hypertension: Secondary | ICD-10-CM | POA: Diagnosis not present

## 2017-04-07 DIAGNOSIS — R0789 Other chest pain: Secondary | ICD-10-CM | POA: Diagnosis not present

## 2017-04-07 NOTE — Patient Instructions (Signed)
Medication Instructions:  Your physician recommends that you continue on your current medications as directed. Please refer to the Current Medication list given to you today.   Labwork: None ordered  Testing/Procedures: Your physician has requested that you have a lexiscan myoview. For further information please visit [email or web address]. Please follow instruction sheet, as given.  Follow-Up: Based on test results  Any Other Special Instructions Will Be Listed Below (If Applicable).     If you need a refill on your cardiac medications before your next appointment, please call your pharmacy.

## 2017-04-13 ENCOUNTER — Observation Stay (HOSPITAL_COMMUNITY)
Admission: EM | Admit: 2017-04-13 | Discharge: 2017-04-14 | Disposition: A | Discharge status: Home / Self Care | Payer: Medicaid Other | Attending: Internal Medicine | Admitting: Internal Medicine

## 2017-04-13 ENCOUNTER — Emergency Department (HOSPITAL_COMMUNITY): Payer: Medicaid Other

## 2017-04-13 ENCOUNTER — Encounter (HOSPITAL_COMMUNITY): Payer: Self-pay | Admitting: Emergency Medicine

## 2017-04-13 ENCOUNTER — Other Ambulatory Visit: Payer: Self-pay

## 2017-04-13 DIAGNOSIS — R072 Precordial pain: Secondary | ICD-10-CM | POA: Diagnosis not present

## 2017-04-13 DIAGNOSIS — Z79899 Other long term (current) drug therapy: Secondary | ICD-10-CM | POA: Insufficient documentation

## 2017-04-13 DIAGNOSIS — I129 Hypertensive chronic kidney disease with stage 1 through stage 4 chronic kidney disease, or unspecified chronic kidney disease: Secondary | ICD-10-CM | POA: Insufficient documentation

## 2017-04-13 DIAGNOSIS — N183 Chronic kidney disease, stage 3 (moderate): Secondary | ICD-10-CM | POA: Insufficient documentation

## 2017-04-13 DIAGNOSIS — F17210 Nicotine dependence, cigarettes, uncomplicated: Secondary | ICD-10-CM | POA: Diagnosis not present

## 2017-04-13 DIAGNOSIS — R079 Chest pain, unspecified: Secondary | ICD-10-CM | POA: Diagnosis present

## 2017-04-13 DIAGNOSIS — F1410 Cocaine abuse, uncomplicated: Secondary | ICD-10-CM | POA: Diagnosis not present

## 2017-04-13 DIAGNOSIS — J449 Chronic obstructive pulmonary disease, unspecified: Secondary | ICD-10-CM | POA: Diagnosis not present

## 2017-04-13 LAB — HEMOGLOBIN A1C
HEMOGLOBIN A1C: 5.9 % — AB (ref 4.8–5.6)
MEAN PLASMA GLUCOSE: 122.63 mg/dL

## 2017-04-13 LAB — BASIC METABOLIC PANEL
ANION GAP: 10 (ref 5–15)
BUN: 12 mg/dL (ref 6–20)
CHLORIDE: 106 mmol/L (ref 101–111)
CO2: 22 mmol/L (ref 22–32)
Calcium: 8.5 mg/dL — ABNORMAL LOW (ref 8.9–10.3)
Creatinine, Ser: 1.01 mg/dL — ABNORMAL HIGH (ref 0.44–1.00)
GFR, EST NON AFRICAN AMERICAN: 58 mL/min — AB (ref >60)
Glucose, Bld: 97 mg/dL (ref 65–99)
POTASSIUM: 3.7 mmol/L (ref 3.5–5.1)
SODIUM: 138 mmol/L (ref 135–145)

## 2017-04-13 LAB — I-STAT TROPONIN, ED
TROPONIN I, POC: 0 ng/mL (ref 0.00–0.08)
Troponin i, poc: 0.01 ng/mL (ref 0.00–0.08)

## 2017-04-13 LAB — CBC
HEMATOCRIT: 35.7 % — AB (ref 36.0–46.0)
HEMOGLOBIN: 11.7 g/dL — AB (ref 12.0–15.0)
MCH: 29.2 pg (ref 26.0–34.0)
MCHC: 32.8 g/dL (ref 30.0–36.0)
MCV: 89 fL (ref 78.0–100.0)
Platelets: 323 10*3/uL (ref 150–400)
RBC: 4.01 MIL/uL (ref 3.87–5.11)
RDW: 15.2 % (ref 11.5–15.5)
WBC: 8.7 10*3/uL (ref 4.0–10.5)

## 2017-04-13 LAB — RAPID URINE DRUG SCREEN, HOSP PERFORMED
Amphetamines: NOT DETECTED
BARBITURATES: NOT DETECTED
Benzodiazepines: NOT DETECTED
COCAINE: POSITIVE — AB
OPIATES: NOT DETECTED
TETRAHYDROCANNABINOL: NOT DETECTED

## 2017-04-13 LAB — TROPONIN I: Troponin I: <0.03 ng/mL (ref <0.03)

## 2017-04-13 MED ORDER — SUCRALFATE 1 G PO TABS
1.0000 g | ORAL_TABLET | Freq: Three times a day (TID) | ORAL | Status: DC
  Administered 2017-04-13 – 2017-04-14 (×4): 1 g via ORAL
  Filled 2017-04-13 (×4): qty 1

## 2017-04-13 MED ORDER — ASPIRIN EC 81 MG PO TBEC
81.0000 mg | DELAYED_RELEASE_TABLET | Freq: Every day | ORAL | Status: DC
  Administered 2017-04-14: 81 mg via ORAL
  Filled 2017-04-13: qty 1

## 2017-04-13 MED ORDER — ALBUTEROL SULFATE (2.5 MG/3ML) 0.083% IN NEBU: 3.0000 mL | INHALATION_SOLUTION | Freq: Four times a day (QID) | RESPIRATORY_TRACT | Status: DC | PRN

## 2017-04-13 MED ORDER — AMLODIPINE BESYLATE 5 MG PO TABS
5.0000 mg | ORAL_TABLET | Freq: Every day | ORAL | Status: DC
Start: 2017-04-13 — End: 2017-04-14
  Administered 2017-04-13 – 2017-04-14 (×2): 5 mg via ORAL
  Filled 2017-04-13 (×2): qty 1

## 2017-04-13 MED ORDER — ACETAMINOPHEN 325 MG PO TABS
650.0000 mg | ORAL_TABLET | ORAL | Status: DC | PRN
  Administered 2017-04-13: 650 mg via ORAL
  Filled 2017-04-13: qty 2

## 2017-04-13 MED ORDER — PREDNISONE 5 MG PO TABS
5.0000 mg | ORAL_TABLET | Freq: Every day | ORAL | Status: DC
  Administered 2017-04-14: 5 mg via ORAL
  Filled 2017-04-13: qty 1

## 2017-04-13 MED ORDER — ASPIRIN 81 MG PO CHEW
324.0000 mg | CHEWABLE_TABLET | ORAL | Status: AC
  Administered 2017-04-13: 324 mg via ORAL
  Filled 2017-04-13: qty 4

## 2017-04-13 MED ORDER — TRIMETHOPRIM 100 MG PO TABS
100.0000 mg | ORAL_TABLET | Freq: Every day | ORAL | Status: DC
  Administered 2017-04-14: 100 mg via ORAL
  Filled 2017-04-13: qty 1

## 2017-04-13 MED ORDER — TAMSULOSIN HCL 0.4 MG PO CAPS
0.4000 mg | ORAL_CAPSULE | Freq: Every day | ORAL | Status: DC
  Administered 2017-04-14: 0.4 mg via ORAL
  Filled 2017-04-13: qty 1

## 2017-04-13 MED ORDER — METHOCARBAMOL 500 MG PO TABS
500.0000 mg | ORAL_TABLET | Freq: Three times a day (TID) | ORAL | Status: DC
  Administered 2017-04-13 – 2017-04-14 (×3): 500 mg via ORAL
  Filled 2017-04-13 (×3): qty 1

## 2017-04-13 MED ORDER — OXYBUTYNIN CHLORIDE 5 MG PO TABS
5.0000 mg | ORAL_TABLET | Freq: Two times a day (BID) | ORAL | Status: DC
  Administered 2017-04-13 – 2017-04-14 (×2): 5 mg via ORAL
  Filled 2017-04-13 (×2): qty 1

## 2017-04-13 MED ORDER — ONDANSETRON HCL 4 MG/2ML IJ SOLN: 4.0000 mg | Freq: Four times a day (QID) | INTRAMUSCULAR | Status: DC | PRN

## 2017-04-13 MED ORDER — ATORVASTATIN CALCIUM 40 MG PO TABS
40.0000 mg | ORAL_TABLET | Freq: Every day | ORAL | Status: DC
  Administered 2017-04-14: 40 mg via ORAL
  Filled 2017-04-13: qty 1

## 2017-04-13 MED ORDER — NITROGLYCERIN 0.4 MG SL SUBL
0.4000 mg | SUBLINGUAL_TABLET | SUBLINGUAL | Status: DC | PRN
  Administered 2017-04-13 (×3): 0.4 mg via SUBLINGUAL
  Filled 2017-04-13: qty 1

## 2017-04-13 MED ORDER — ENOXAPARIN SODIUM 40 MG/0.4ML ~~LOC~~ SOLN
40.0000 mg | SUBCUTANEOUS | Status: DC
  Administered 2017-04-13: 40 mg via SUBCUTANEOUS
  Filled 2017-04-13: qty 0.4

## 2017-04-13 MED ORDER — PANTOPRAZOLE SODIUM 40 MG PO TBEC
40.0000 mg | DELAYED_RELEASE_TABLET | Freq: Every day | ORAL | Status: DC
  Administered 2017-04-13 – 2017-04-14 (×2): 40 mg via ORAL
  Filled 2017-04-13 (×2): qty 1

## 2017-04-13 MED ORDER — ASPIRIN 300 MG RE SUPP: 300.0000 mg | RECTAL | Status: AC

## 2017-04-13 NOTE — Progress Notes (Signed)
Patient c/o CP and stated pain level 7/10 and feels like pressures/tightness, does not radiate anywhere else.  EKG was done and given nitroglycerine x 3 doses.  After the first dose, pain level went down to a 5 and then a 2, until it was resolved.  Placed patient on 4 L of O2.  I will keep monitoring the patient.

## 2017-04-13 NOTE — ED Notes (Signed)
Patient transported to X-ray 

## 2017-04-13 NOTE — ED Triage Notes (Signed)
Patient arrived to ED via [facility] from home. EMS reports:  Patient c/o intermittent chest pain x 1 week. Worse this morning. Called EMS. Also, c/o nausea/vomiting. EMS administered NTG x 1. VSS. BP 114/76, Pulse 92, Resp 20, 98% on room air. 20 gauge in R hand.
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
## 2017-04-13 NOTE — ED Notes (Signed)
HH tray ordered 
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
## 2017-04-13 NOTE — ED Notes (Addendum)
Attempted to call report to [facility]. Left name & number with NS to give to receiving RN.

## 2017-04-14 ENCOUNTER — Other Ambulatory Visit: Payer: Self-pay

## 2017-04-14 ENCOUNTER — Observation Stay (HOSPITAL_BASED_OUTPATIENT_CLINIC_OR_DEPARTMENT_OTHER): Payer: Medicaid Other

## 2017-04-14 DIAGNOSIS — R079 Chest pain, unspecified: Secondary | ICD-10-CM

## 2017-04-14 DIAGNOSIS — F1410 Cocaine abuse, uncomplicated: Secondary | ICD-10-CM | POA: Diagnosis not present

## 2017-04-14 DIAGNOSIS — N183 Chronic kidney disease, stage 3 (moderate): Secondary | ICD-10-CM | POA: Diagnosis not present

## 2017-04-14 DIAGNOSIS — J449 Chronic obstructive pulmonary disease, unspecified: Secondary | ICD-10-CM | POA: Diagnosis not present

## 2017-04-14 DIAGNOSIS — I129 Hypertensive chronic kidney disease with stage 1 through stage 4 chronic kidney disease, or unspecified chronic kidney disease: Secondary | ICD-10-CM | POA: Diagnosis not present

## 2017-04-14 LAB — LIPID PANEL
CHOL/HDL RATIO: 3.5 ratio
CHOLESTEROL: 163 mg/dL (ref 0–200)
HDL: 47 mg/dL (ref >40)
LDL Cholesterol: 99 mg/dL (ref 0–99)
Triglycerides: 85 mg/dL (ref <150)
VLDL: 17 mg/dL (ref 0–40)

## 2017-04-14 LAB — NM MYOCAR MULTI W/SPECT W/WALL MOTION / EF
CHL CUP RESTING HR STRESS: 75 {beats}/min
CSEPED: 5 min
CSEPEW: 1 METS
CSEPPHR: 105 {beats}/min
MPHR: 157 {beats}/min
Percent HR: 66 %

## 2017-04-14 LAB — TROPONIN I: Troponin I: <0.03 ng/mL (ref <0.03)

## 2017-04-14 LAB — PROTIME-INR
INR: 1.02
Prothrombin Time: 13.3 seconds (ref 11.4–15.2)

## 2017-04-14 LAB — BASIC METABOLIC PANEL
Anion gap: 11 (ref 5–15)
BUN: 13 mg/dL (ref 6–20)
CALCIUM: 8.3 mg/dL — AB (ref 8.9–10.3)
CO2: 19 mmol/L — ABNORMAL LOW (ref 22–32)
CREATININE: 1.07 mg/dL — AB (ref 0.44–1.00)
Chloride: 105 mmol/L (ref 101–111)
GFR calc Af Amer: >60 mL/min (ref >60)
GFR, EST NON AFRICAN AMERICAN: 54 mL/min — AB (ref >60)
GLUCOSE: 90 mg/dL (ref 65–99)
POTASSIUM: 4.1 mmol/L (ref 3.5–5.1)
SODIUM: 135 mmol/L (ref 135–145)

## 2017-04-14 LAB — CBC
HEMATOCRIT: 36.9 % (ref 36.0–46.0)
HEMOGLOBIN: 12.2 g/dL (ref 12.0–15.0)
MCH: 29.5 pg (ref 26.0–34.0)
MCHC: 33.1 g/dL (ref 30.0–36.0)
MCV: 89.3 fL (ref 78.0–100.0)
PLATELETS: 328 10*3/uL (ref 150–400)
RBC: 4.13 MIL/uL (ref 3.87–5.11)
RDW: 15.7 % — ABNORMAL HIGH (ref 11.5–15.5)
WBC: 6.9 10*3/uL (ref 4.0–10.5)

## 2017-04-14 MED ORDER — AMLODIPINE BESYLATE 5 MG PO TABS: 5.0000 mg | ORAL_TABLET | Freq: Every day | ORAL | 5 refills | Status: DC

## 2017-04-14 MED ORDER — TECHNETIUM TC 99M TETROFOSMIN IV KIT
10.0000 | PACK | Freq: Once | INTRAVENOUS | Status: AC | PRN
  Administered 2017-04-14: 10 via INTRAVENOUS

## 2017-04-14 MED ORDER — REGADENOSON 0.4 MG/5ML IV SOLN
INTRAVENOUS | Status: AC
  Filled 2017-04-14: qty 5

## 2017-04-14 MED ORDER — ACETAMINOPHEN 325 MG PO TABS: 650.0000 mg | ORAL_TABLET | ORAL | Status: DC | PRN

## 2017-04-14 MED ORDER — REGADENOSON 0.4 MG/5ML IV SOLN
0.4000 mg | Freq: Once | INTRAVENOUS | Status: AC
  Administered 2017-04-14: 0.4 mg via INTRAVENOUS
  Filled 2017-04-14: qty 5

## 2017-04-14 MED ORDER — TECHNETIUM TC 99M TETROFOSMIN IV KIT
30.0000 | PACK | Freq: Once | INTRAVENOUS | Status: AC | PRN
  Administered 2017-04-14: 30 via INTRAVENOUS

## 2017-04-14 NOTE — Discharge Summary (Signed)
Discharge Summary    Patient ID: [name],  MRN: [id number], DOB/AGE: [birth year] 62 y.o.  Admit date: 04/13/2017 Discharge date: 04/14/2017  Primary Care Provider: Nolene Ebbs Primary Cardiologist: Larae Grooms, MD  Discharge Diagnoses    Active Problems:   Chest pain   Allergies Allergies  Allergen Reactions  . Iohexol Anaphylaxis  . Levofloxacin Other (See Comments)    BASELINE PROLONGED QTc.    Marland Kitchen Zofran [Ondansetron Hcl] Other (See Comments)    BASELINE PROLONGED QTc.  Marland Kitchen Heparin Itching and Other (See Comments)    Pt is able to tolerate IV heparin, but not sub-Q.  Marland Kitchen Morphine And Related Itching  . Penicillins Itching    Has patient had a PCN reaction causing immediate rash, facial/tongue/throat swelling, SOB or lightheadedness with hypotension: Yes Has patient had a PCN reaction causing severe rash involving mucus membranes or skin necrosis: No Has patient had a PCN reaction that required hospitalization: No Has patient had a PCN reaction occurring within the last 10 years: Yes If all of the above answers are "NO", then may proceed with Cephalosporin use.      Diagnostic Studies/Procedures    Nuclear Stress Test 04/14/17  FINDINGS: Reported EKG: No ST segment deviation during stress.  Perfusion: No decreased activity in the left ventricle on stress imaging to suggest reversible ischemia or infarction.  Wall Motion: Normal left ventricular wall motion. No left ventricular dilation.  Left Ventricular Ejection Fraction: 61 %  End diastolic volume 84 ml  End systolic volume 32 ml  IMPRESSION: 1. No reversible ischemia or infarction.  2. Normal left ventricular wall motion.  3. Left ventricular ejection fraction 61%  4. Non invasive risk stratification*: Low    History of Present Illness     [name] a 62 y.o.femalewith a hx of chronic pain, cocaine abuse, tobacco abuse, COPD, GERD, and atypical chest painwho  presented [facility] ED [facility] 04[facility]13/17 with complaint of chest pain.UDS + for cocaine on admit. Last used 2 days prior to ED presentation. Also of note, pt was seen recently in clinic by Dr. Irish Lack for preoperative clearance for spine surgery and was ordered to get an outpatient NST (not completed prior to hospital admit)  Hospital Course     EKG in the ED was nonacute and initial troponin was negative. CXR negative. UDS + for cocaine. Given cocaine use, pt was admitted to telemetry for rule out. Cardiac enzymes were cycled and negative x 3. The next morning, she was CP free and pt underwent nuclear stress testing, which was negative for ischemia. EF 61% by nuclear test. Pt was seen by Dr. Debara Pickett, who determined she was stable for discharge home. She was advised against further use of cocaine. The results of her stress test will be reported to Dr. Irish Lack. Our office will be notified to cancel her outpatient nuclear study. Pt can f/u with Dr. Irish Lack as directed.   Consultants: none   Discharge Vitals Blood pressure 127/62, pulse 76, temperature 98.3 F (36.8 C), temperature source Oral, resp. rate 17, height 5\' 6"  (1.676 m), weight 177 lb 11.1 oz (80.6 kg), SpO2 93 %.  Filed Weights   04/13/17 1907 04/14/17 0602  Weight: 177 lb 11.2 oz (80.6 kg) 177 lb 11.1 oz (80.6 kg)    Labs & Radiologic Studies    CBC Recent Labs    04/13/17 1040 04/14/17 0659  WBC 8.7 6.9  HGB 11.7* 12.2  HCT 35.7* 36.9  MCV 89.0 89.3  PLT 323 891   Basic Metabolic Panel Recent Labs    04/13/17 1040 04/14/17 0659  NA 138 135  K 3.7 4.1  CL 106 105  CO2 22 19*  GLUCOSE 97 90  BUN 12 13  CREATININE 1.01* 1.07*  CALCIUM 8.5* 8.3*   Liver Function Tests No results for input(s): AST, ALT, ALKPHOS, BILITOT, PROT, ALBUMIN in the last 72 hours. No results for input(s): LIPASE, AMYLASE in the last 72 hours. Cardiac Enzymes Recent Labs    04/13/17 1923 04/14/17 0237 04/14/17 0659  TROPONINI <0.03 <0.03  <0.03   BNP Invalid input(s): POCBNP D-Dimer No results for input(s): DDIMER in the last 72 hours. Hemoglobin A1C Recent Labs    04/13/17 1923  HGBA1C 5.9*   Fasting Lipid Panel Recent Labs    04/14/17 0237  CHOL 163  HDL 47  LDLCALC 99  TRIG 85  CHOLHDL 3.5   Thyroid Function Tests No results for input(s): TSH, T4TOTAL, T3FREE, THYROIDAB in the last 72 hours.  Invalid input(s): FREET3 _____________  Dg Chest 2 View  Result Date: 04/13/2017 CLINICAL DATA:  62 year old female with left side chest pain and shortness of breath since yesterday. EXAM: CHEST - 2 VIEW COMPARISON:  Chest CT 01/06/2017 and earlier. FINDINGS: Stable cardiac size and mediastinal contours. Moderate gastric hiatal hernia re-demonstrated. Mildly lower lung volumes since December. Visualized tracheal air column is within normal limits. No pneumothorax, pulmonary edema or pleural effusion. Chronic left posterolateral rib fractures. The mild crowding of lung markings at the bases. No other confluent opacity. No acute osseous abnormality identified. Prior cervical ACDF. Negative visible bowel gas pattern. IMPRESSION: 1. Chronic Emphysema (ICD10-J43.9). Lower lung volumes since December with no acute cardiopulmonary abnormality identified. 2. Chronic hiatal hernia. Electronically Signed   By: Genevie Ann M.D.   On: 04/13/2017 09:50   Nm Myocar Multi W/spect W/wall Motion / Ef  Result Date: 04/14/2017 CLINICAL DATA:  62 year old female with preoperative exam smoking history. High cholesterol short of breath. EXAM: MYOCARDIAL IMAGING WITH SPECT (REST AND PHARMACOLOGIC-STRESS) GATED LEFT VENTRICULAR WALL MOTION STUDY LEFT VENTRICULAR EJECTION FRACTION TECHNIQUE: Standard myocardial SPECT imaging was performed after resting intravenous injection of 10 mCi Tc-32m tetrofosmin. Subsequently, intravenous infusion of Lexiscan was performed under the supervision of the Cardiology staff. At peak effect of the drug, 30 mCi Tc-19m  tetrofosmin was injected intravenously and standard myocardial SPECT imaging was performed. Quantitative gated imaging was also performed to evaluate left ventricular wall motion, and estimate left ventricular ejection fraction. COMPARISON:  None. FINDINGS: Reported EKG: No ST segment deviation during stress. Perfusion: No decreased activity in the left ventricle on stress imaging to suggest reversible ischemia or infarction. Wall Motion: Normal left ventricular wall motion. No left ventricular dilation. Left Ventricular Ejection Fraction: 61 % End diastolic volume 84 ml End systolic volume 32 ml IMPRESSION: 1. No reversible ischemia or infarction. 2. Normal left ventricular wall motion. 3. Left ventricular ejection fraction 61% 4. Non invasive risk stratification*: Low *2012 Appropriate Use Criteria for Coronary Revascularization Focused Update: J Am Coll Cardiol. 6945;03(8):882-800. http://content.airportbarriers.com.aspx?articleid=[email or web address] Electronically Signed   By: Suzy Bouchard M.D.   On: 04/14/2017 13:50   Disposition   Pt is being discharged home today in good condition.  Follow-up Plans & Appointments    Follow-up Information    Jettie Booze, MD Follow up.   Specialties:  Cardiology, Radiology, Interventional Cardiology Why:  our office will call you with an appointment.  Contact information: [address]  [place] [address] [phone]          Discharge Instructions    Diet - low sodium heart healthy   Complete by:  As directed    Increase activity slowly   Complete by:  As directed       Discharge Medications   Allergies as of 04/14/2017      Reactions   Iohexol Anaphylaxis   Levofloxacin Other (See Comments)   BASELINE PROLONGED QTc.     Zofran [ondansetron Hcl] Other (See Comments)   BASELINE PROLONGED QTc.   Heparin Itching, Other (See Comments)   Pt is able to tolerate IV heparin, but not sub-Q.   Morphine And Related Itching     Penicillins Itching   Has patient had a PCN reaction causing immediate rash, facial/tongue/throat swelling, SOB or lightheadedness with hypotension: Yes Has patient had a PCN reaction causing severe rash involving mucus membranes or skin necrosis: No Has patient had a PCN reaction that required hospitalization: No Has patient had a PCN reaction occurring within the last 10 years: Yes If all of the above answers are "NO", then may proceed with Cephalosporin use.      Medication List    TAKE these medications   albuterol 108 (90 Base) MCG/ACT inhaler Commonly known as:  PROVENTIL HFA;VENTOLIN HFA Inhale 1-2 puffs into the lungs every 6 (six) hours as needed for wheezing or shortness of breath.   amLODipine 5 MG tablet Commonly known as:  NORVASC Take 1 tablet (5 mg total) by mouth daily. Start taking on:  04/15/2017   atorvastatin 40 MG tablet Commonly known as:  LIPITOR Take 1 tablet (40 mg total) by mouth daily at 6 PM.   methocarbamol 500 MG tablet Commonly known as:  ROBAXIN Take 500 mg by mouth 3 (three) times daily.   oxybutynin 5 MG tablet Commonly known as:  DITROPAN Take 5 mg by mouth 2 (two) times daily.   oxyCODONE-acetaminophen 5-325 MG tablet Commonly known as:  PERCOCET/ROXICET Take 1 tablet by mouth every 6 (six) hours as needed.   pantoprazole 40 MG tablet Commonly known as:  PROTONIX Take 1 tablet (40 mg total) by mouth daily.   predniSONE 5 MG tablet Commonly known as:  DELTASONE Take 5 mg by mouth daily.   sucralfate 1 g tablet Commonly known as:  CARAFATE Take 1 tablet (1 g total) by mouth 4 (four) times daily -  with meals and at bedtime.   tamsulosin 0.4 MG Caps capsule Commonly known as:  FLOMAX Take 0.4 mg by mouth daily.   trimethoprim 100 MG tablet Commonly known as:  TRIMPEX Take 100 mg by mouth daily.       Outstanding Labs/Studies   None   Duration of Discharge Encounter   Greater than 30 minutes including physician  time.  Signed, Lyda Jester PA-C 04/14/2017, 3:45 PM

## 2017-04-14 NOTE — Progress Notes (Signed)
Reviewed discharge paperwork and educated about new medicine and appointments. No further questions. Pt discharging to home.

## 2017-04-14 NOTE — Progress Notes (Signed)
Progress Note  Patient Name: [name] Date of Encounter: 04/14/2017  Primary Cardiologist: Larae Grooms, MD   Subjective   Doing ok but still with intermittent CP. Feels like a tightness in her chest but also with pain with palpation of chest wall.    Inpatient Medications    Scheduled Meds: . amLODipine  5 mg Oral Daily  . aspirin EC  81 mg Oral Daily  . atorvastatin  40 mg Oral q1800  . enoxaparin (LOVENOX) injection  40 mg Subcutaneous Q24H  . methocarbamol  500 mg Oral TID  . oxybutynin  5 mg Oral BID  . pantoprazole  40 mg Oral Daily  . predniSONE  5 mg Oral Daily  . sucralfate  1 g Oral TID WC & HS  . tamsulosin  0.4 mg Oral Daily  . trimethoprim  100 mg Oral Daily   Continuous Infusions:  PRN Meds: acetaminophen, albuterol, nitroGLYCERIN   Vital Signs    Vitals:   04/13/17 1907 04/13/17 2114 04/14/17 0039 04/14/17 0602  BP:  133/74  (!) 128/92  Pulse:  75 78 72  Resp:  (!) 22  17  Temp:  97.6 F (36.4 C) 98.1 F (36.7 C) 98.2 F (36.8 C)  TempSrc:  Oral Oral Oral  SpO2:  98% 97% 96%  Weight: 177 lb 11.2 oz (80.6 kg)   177 lb 11.1 oz (80.6 kg)  Height: 5\' 6"  (1.676 m)       Intake/Output Summary (Last 24 hours) at 04/14/2017 0839 Last data filed at 04/14/2017 0750 Gross per 24 hour  Intake -  Output 500 ml  Net -500 ml   Filed Weights   04/13/17 1907 04/14/17 0602  Weight: 177 lb 11.2 oz (80.6 kg) 177 lb 11.1 oz (80.6 kg)    Telemetry    Currently NSR but has a brief run of SVT on tele - Personally Reviewed  ECG    NSR 60 bpm - Personally Reviewed  Physical Exam   GEN: No acute distress.   Neck: No JVD Cardiac: RRR, no murmurs, rubs, or gallops.  Respiratory: crackles in the RLL, that improve after deep cough GI: Soft, nontender, non-distended  MS: No edema; No deformity. Neuro:  Nonfocal  Psych: Normal affect   Labs    Chemistry Recent Labs  Lab 04/13/17 1040 04/14/17 0659  NA 138 135  K 3.7 4.1  CL 106 105  CO2  22 19*  GLUCOSE 97 90  BUN 12 13  CREATININE 1.01* 1.07*  CALCIUM 8.5* 8.3*  GFRNONAA 58* 54*  GFRAA >60 >60  ANIONGAP 10 11     Hematology Recent Labs  Lab 04/13/17 1040  WBC 8.7  RBC 4.01  HGB 11.7*  HCT 35.7*  MCV 89.0  MCH 29.2  MCHC 32.8  RDW 15.2  PLT 323    Cardiac Enzymes Recent Labs  Lab 04/13/17 1923 04/14/17 0237 04/14/17 0659  TROPONINI <0.03 <0.03 <0.03    Recent Labs  Lab 04/13/17 1044 04/13/17 1540  TROPIPOC 0.01 0.00     BNPNo results for input(s): BNP, PROBNP in the last 168 hours.   DDimer No results for input(s): DDIMER in the last 168 hours.   Radiology    Dg Chest 2 View  Result Date: 04/13/2017 CLINICAL DATA:  62 year old female with left side chest pain and shortness of breath since yesterday. EXAM: CHEST - 2 VIEW COMPARISON:  Chest CT 01/06/2017 and earlier. FINDINGS: Stable cardiac size and mediastinal contours. Moderate gastric hiatal hernia  re-demonstrated. Mildly lower lung volumes since December. Visualized tracheal air column is within normal limits. No pneumothorax, pulmonary edema or pleural effusion. Chronic left posterolateral rib fractures. The mild crowding of lung markings at the bases. No other confluent opacity. No acute osseous abnormality identified. Prior cervical ACDF. Negative visible bowel gas pattern. IMPRESSION: 1. Chronic Emphysema (ICD10-J43.9). Lower lung volumes since December with no acute cardiopulmonary abnormality identified. 2. Chronic hiatal hernia. Electronically Signed   By: Genevie Ann M.D.   On: 04/13/2017 09:50    Cardiac Studies   2D Echo 06/30/15 Study Conclusions  - Left ventricle: The cavity size was normal. There was mild   concentric hypertrophy. Systolic function was normal. The   estimated ejection fraction was in the range of 60% to 65%. Wall   motion was normal; there were no regional wall motion   abnormalities. Doppler parameters are consistent with abnormal   left ventricular relaxation  (grade 1 diastolic dysfunction).   Doppler parameters are consistent with indeterminate ventricular   filling pressure. - Aortic valve: Transvalvular velocity was within the normal range.   There was no stenosis. There was no regurgitation. - Mitral valve: Transvalvular velocity was within the normal range.   There was no evidence for stenosis. There was no regurgitation. - Right ventricle: The cavity size was normal. Wall thickness was   normal. Systolic function was normal. - Tricuspid valve: There was no regurgitation.  Patient Profile     [name] is a 62 y.o. female with a hx of chronic pain, cocaine abuse, tobacco abuse, COPD, GERD, and atypical chest pain who is being seen today for the evaluation of chest pain. UDS + for cocaine on admit. Last used 3 days ago. Cardiac enzymes are negative x 3. Pt also seen recently in clinic for preoperative clearance for spine surgery and ordered to get outpatient NST that is scheduled 04/27/17.   Assessment & Plan    1. Chest Pain: in the setting of recent cocaine use. UDS +. ? Coronary spasms. Also musculoskeletal component, with pain with palpation of chest wall. Cardiac enzymes are negative. Pt was scheduled to get outpatient NST as part of preoperative clearance prior to back surgery. We will get study done while inpatient. Avoidance of cocaine advised. No BBs given cocaine use.  Ok to continue on CCB, amlodipine. If she continues to have pain, may consider addition of nitrates.   2. Cocaine Abuse: abstinence advised. No BBs.   3. Preoperative Evaluation: will obtain NST today. If low risk, she can be cleared for upcoming spinal surgery.   For questions or updates, please contact [facility] Please consult [email or web address] for contact info under Cardiology/STEMI.      Signed, Lyda Jester, PA-C  04/14/2017, 8:39 AM

## 2017-04-19 ENCOUNTER — Encounter (HOSPITAL_COMMUNITY): Payer: Medicaid Other

## 2017-04-24 ENCOUNTER — Encounter (HOSPITAL_COMMUNITY): Payer: Self-pay | Admitting: *Deleted

## 2017-04-24 ENCOUNTER — Other Ambulatory Visit: Payer: Self-pay

## 2017-04-24 ENCOUNTER — Emergency Department (HOSPITAL_COMMUNITY)
Admission: EM | Admit: 2017-04-24 | Discharge: 2017-04-24 | Disposition: A | Discharge status: Home / Self Care | Payer: Medicaid Other | Attending: Emergency Medicine | Admitting: Emergency Medicine

## 2017-04-24 DIAGNOSIS — M1990 Unspecified osteoarthritis, unspecified site: Secondary | ICD-10-CM

## 2017-04-24 DIAGNOSIS — M25512 Pain in left shoulder: Secondary | ICD-10-CM | POA: Diagnosis not present

## 2017-04-24 DIAGNOSIS — M069 Rheumatoid arthritis, unspecified: Secondary | ICD-10-CM | POA: Insufficient documentation

## 2017-04-24 DIAGNOSIS — J449 Chronic obstructive pulmonary disease, unspecified: Secondary | ICD-10-CM | POA: Diagnosis not present

## 2017-04-24 DIAGNOSIS — F17210 Nicotine dependence, cigarettes, uncomplicated: Secondary | ICD-10-CM | POA: Insufficient documentation

## 2017-04-24 DIAGNOSIS — M25562 Pain in left knee: Secondary | ICD-10-CM | POA: Insufficient documentation

## 2017-04-24 DIAGNOSIS — N183 Chronic kidney disease, stage 3 (moderate): Secondary | ICD-10-CM | POA: Diagnosis not present

## 2017-04-24 DIAGNOSIS — M25572 Pain in left ankle and joints of left foot: Secondary | ICD-10-CM | POA: Diagnosis not present

## 2017-04-24 DIAGNOSIS — R52 Pain, unspecified: Secondary | ICD-10-CM | POA: Diagnosis present

## 2017-04-24 DIAGNOSIS — Z79899 Other long term (current) drug therapy: Secondary | ICD-10-CM | POA: Insufficient documentation

## 2017-04-24 DIAGNOSIS — G894 Chronic pain syndrome: Secondary | ICD-10-CM

## 2017-04-24 DIAGNOSIS — I129 Hypertensive chronic kidney disease with stage 1 through stage 4 chronic kidney disease, or unspecified chronic kidney disease: Secondary | ICD-10-CM | POA: Diagnosis not present

## 2017-04-24 MED ORDER — KETOROLAC TROMETHAMINE 30 MG/ML IJ SOLN
30.0000 mg | Freq: Once | INTRAMUSCULAR | Status: AC
  Administered 2017-04-24: 30 mg via INTRAVENOUS
  Filled 2017-04-24: qty 1

## 2017-04-24 MED ORDER — OXYCODONE-ACETAMINOPHEN 5-325 MG PO TABS
1.0000 | ORAL_TABLET | Freq: Once | ORAL | Status: AC
  Administered 2017-04-24: 1 via ORAL
  Filled 2017-04-24: qty 1
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
## 2017-04-24 NOTE — Discharge Instructions (Addendum)
You were seen today for pain.  You need to follow-up with pain management and your primary physician for ongoing pain management needs.

## 2017-04-24 NOTE — ED Notes (Signed)
Pt stated "My doctor can only give me 20 pain pills.  They sent me to the pain clinic and scheduled me an appt to go back on the 25th.  They told me I had to to wait 2 weeks.  I used to do cocaine because of the pain but I'm clean."

## 2017-04-24 NOTE — ED Triage Notes (Signed)
Per [facility], pt has been out of pain meds x 1 week.  Multiple pain sites.

## 2017-04-25 IMAGING — US US ABDOMEN COMPLETE
1 series · 13 of 25 positions shown · non-contrast
Comparison: CT abdomen and pelvis November 18, 2013

CLINICAL DATA: Chest and upper abdominal pain

EXAM:
ULTRASOUND ABDOMEN COMPLETE

[Series 1: us abdomen complete · 0.12mm/px · 13 of 96 slices shown]
[im 1/96]
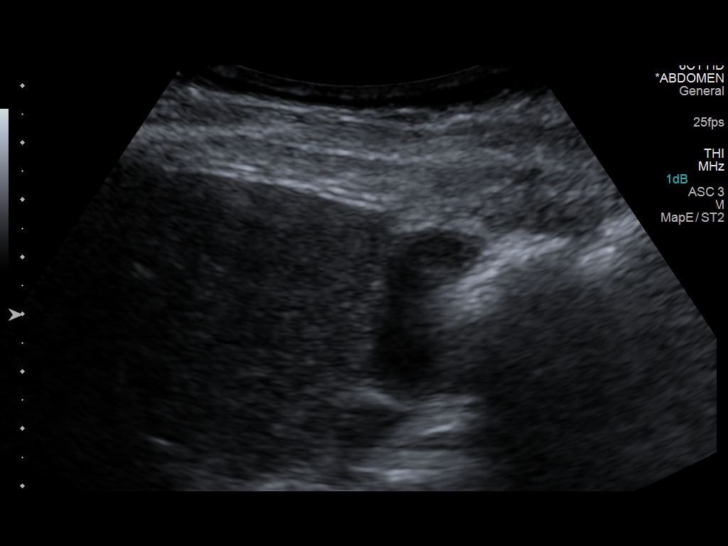
[im 8/96]
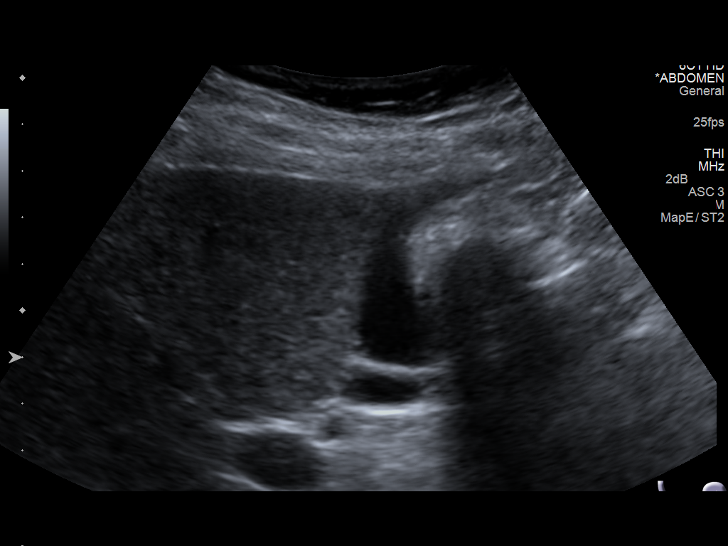
[im 16/96]
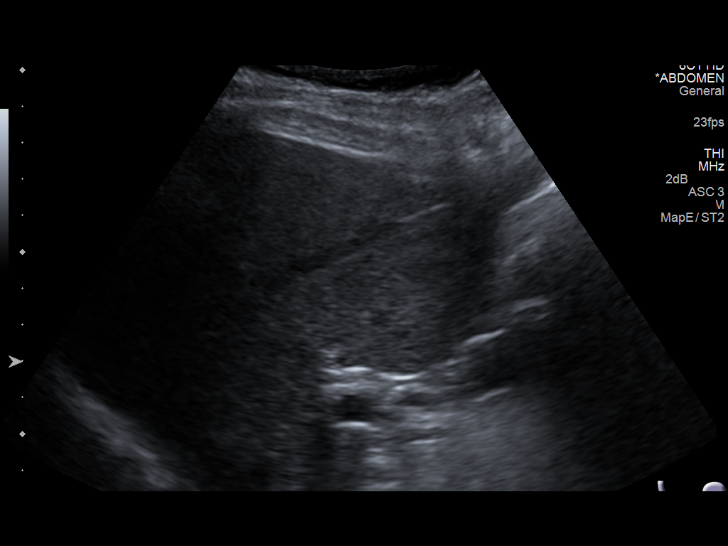
[im 24/96]
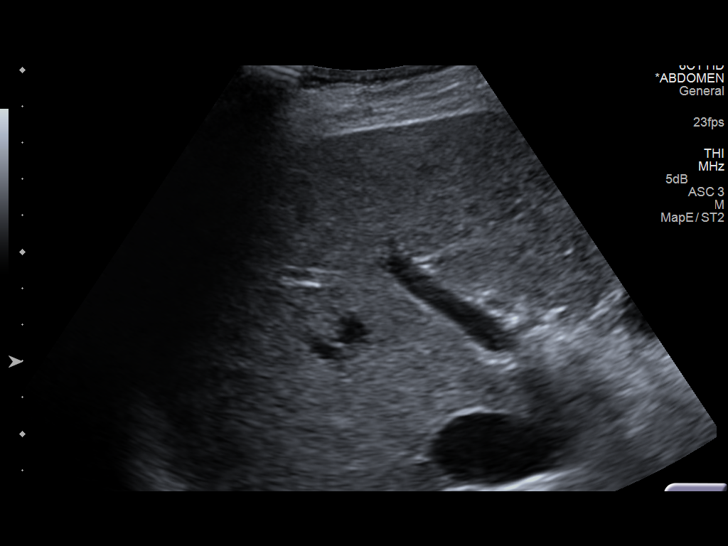
[im 32/96]
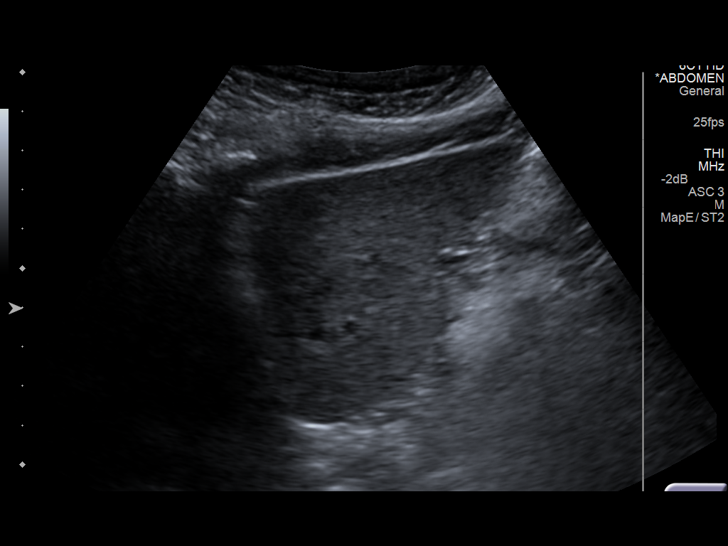
[im 40/96]
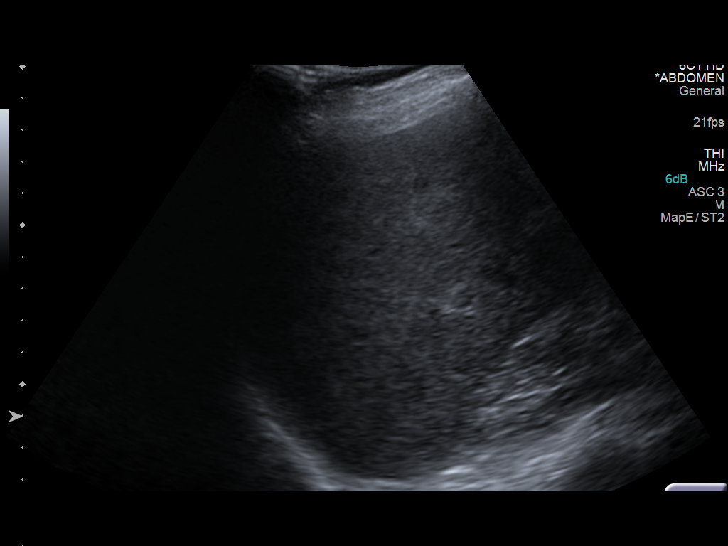
[im 48/96]
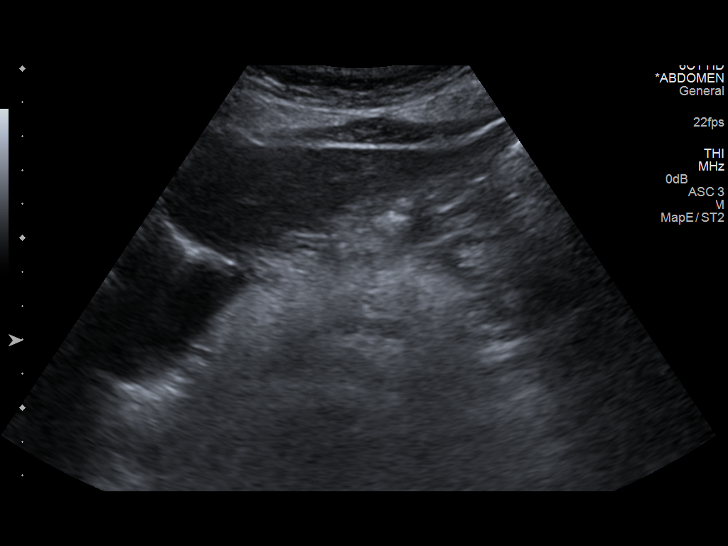
[im 56/96]
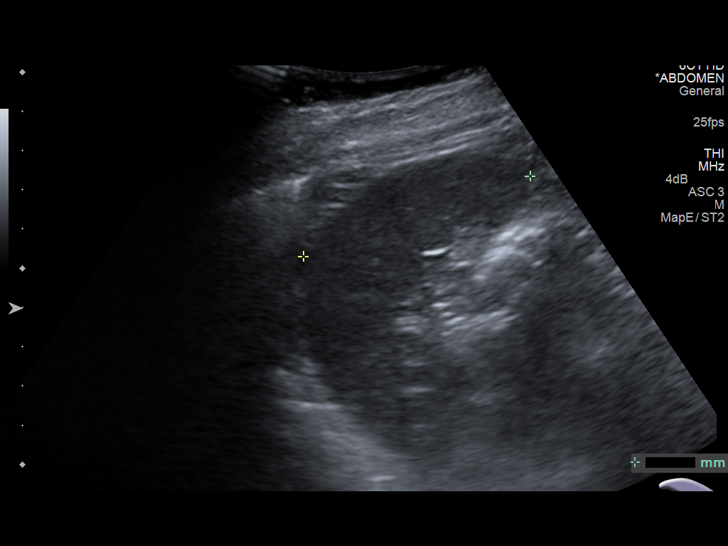
[im 64/96]
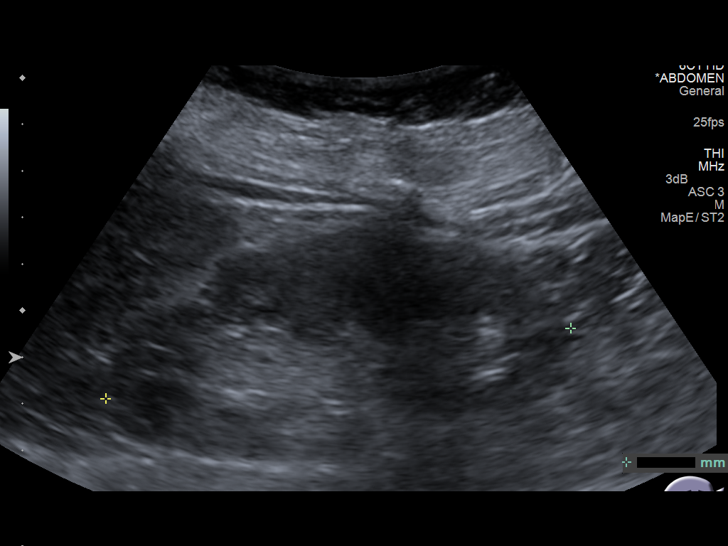
[im 72/96]
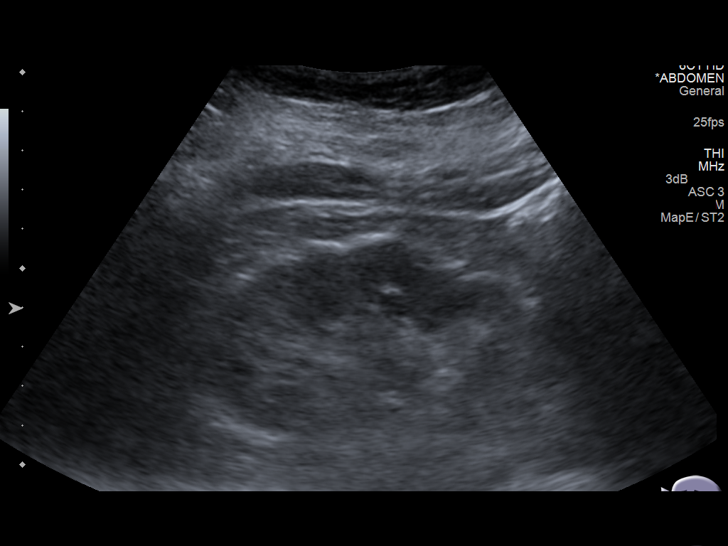
[im 80/96]
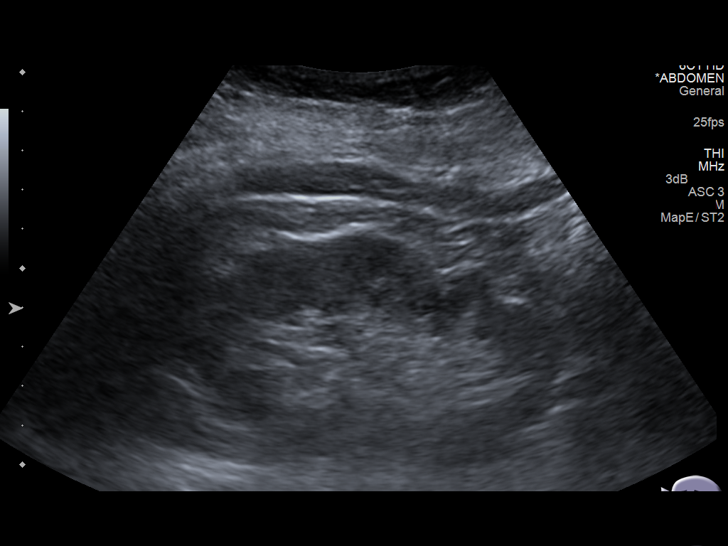
[im 88/96]
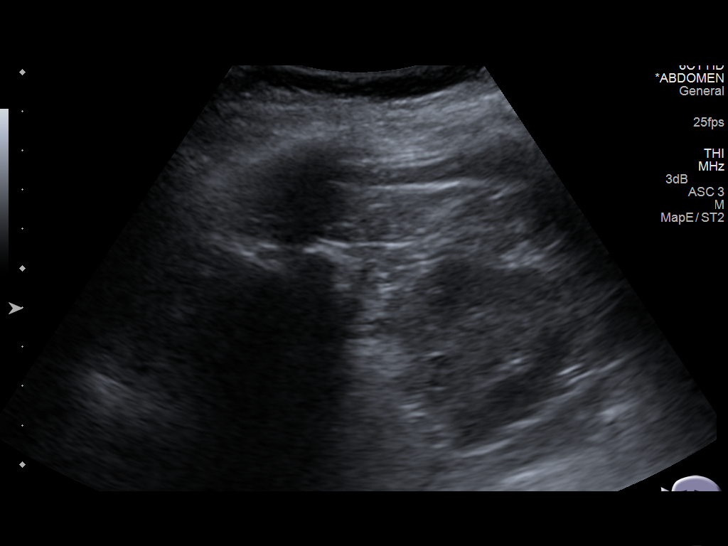
[im 96/96]
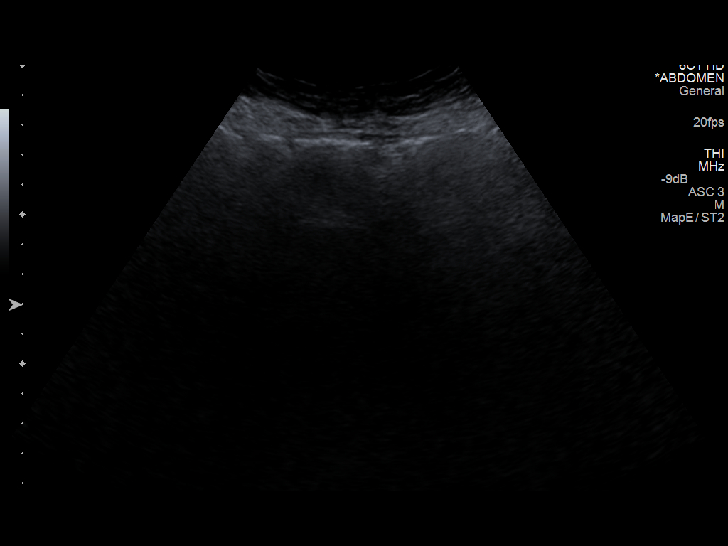

[13 of 25 positions shown; findings below may reference images not displayed]

FINDINGS: Gallbladder: No gallstones or wall thickening visualized. There is
no pericholecystic fluid. No sonographic Murphy sign noted.

Common bile duct: Diameter: 3 mm. There is no intrahepatic, common
hepatic, or common bile duct dilatation.

Liver: No focal lesion identified. Within normal limits in
parenchymal echogenicity.

IVC: No abnormality visualized.

Pancreas: No mass or inflammatory focus. Pancreatic duct is
prominent measuring 5 mm. No obstructing lesion is seen by
ultrasound in the region of the pancreatic duct.

Spleen: Size and appearance within normal limits.

Right Kidney: Length: 10.1 cm. Echogenicity within normal limits. No
mass or hydronephrosis visualized.

Left Kidney: Length: 10.7 cm. Echogenicity within normal limits. No
hydronephrosis visualized. There is a cyst in the mid left kidney
measuring 1.8 x 1.2 x 1.8 cm

Abdominal aorta: No aneurysm visualized.

Other findings: No demonstrable ascites.
IMPRESSION: Prominence of the pancreatic duct without mass or inflammatory focus
appreciable by ultrasound. Significance of this finding is
uncertain. Appropriate laboratory values to assess for pancreatitis
advised. This finding may warrant nonemergent pre and postcontrast
pancreatic MR to further evaluate.

Small left renal cyst.

Study otherwise unremarkable.

## 2017-04-27 ENCOUNTER — Encounter (HOSPITAL_COMMUNITY): Payer: Medicaid Other

## 2017-05-19 IMAGING — CR DG CHEST 2V
2 series · 2 of 2 positions shown · non-contrast
Comparison: 08/09/2015

CLINICAL DATA: Left chest pain and left arm pain with hypertension.

EXAM:
CHEST  2 VIEW

[chest pa]
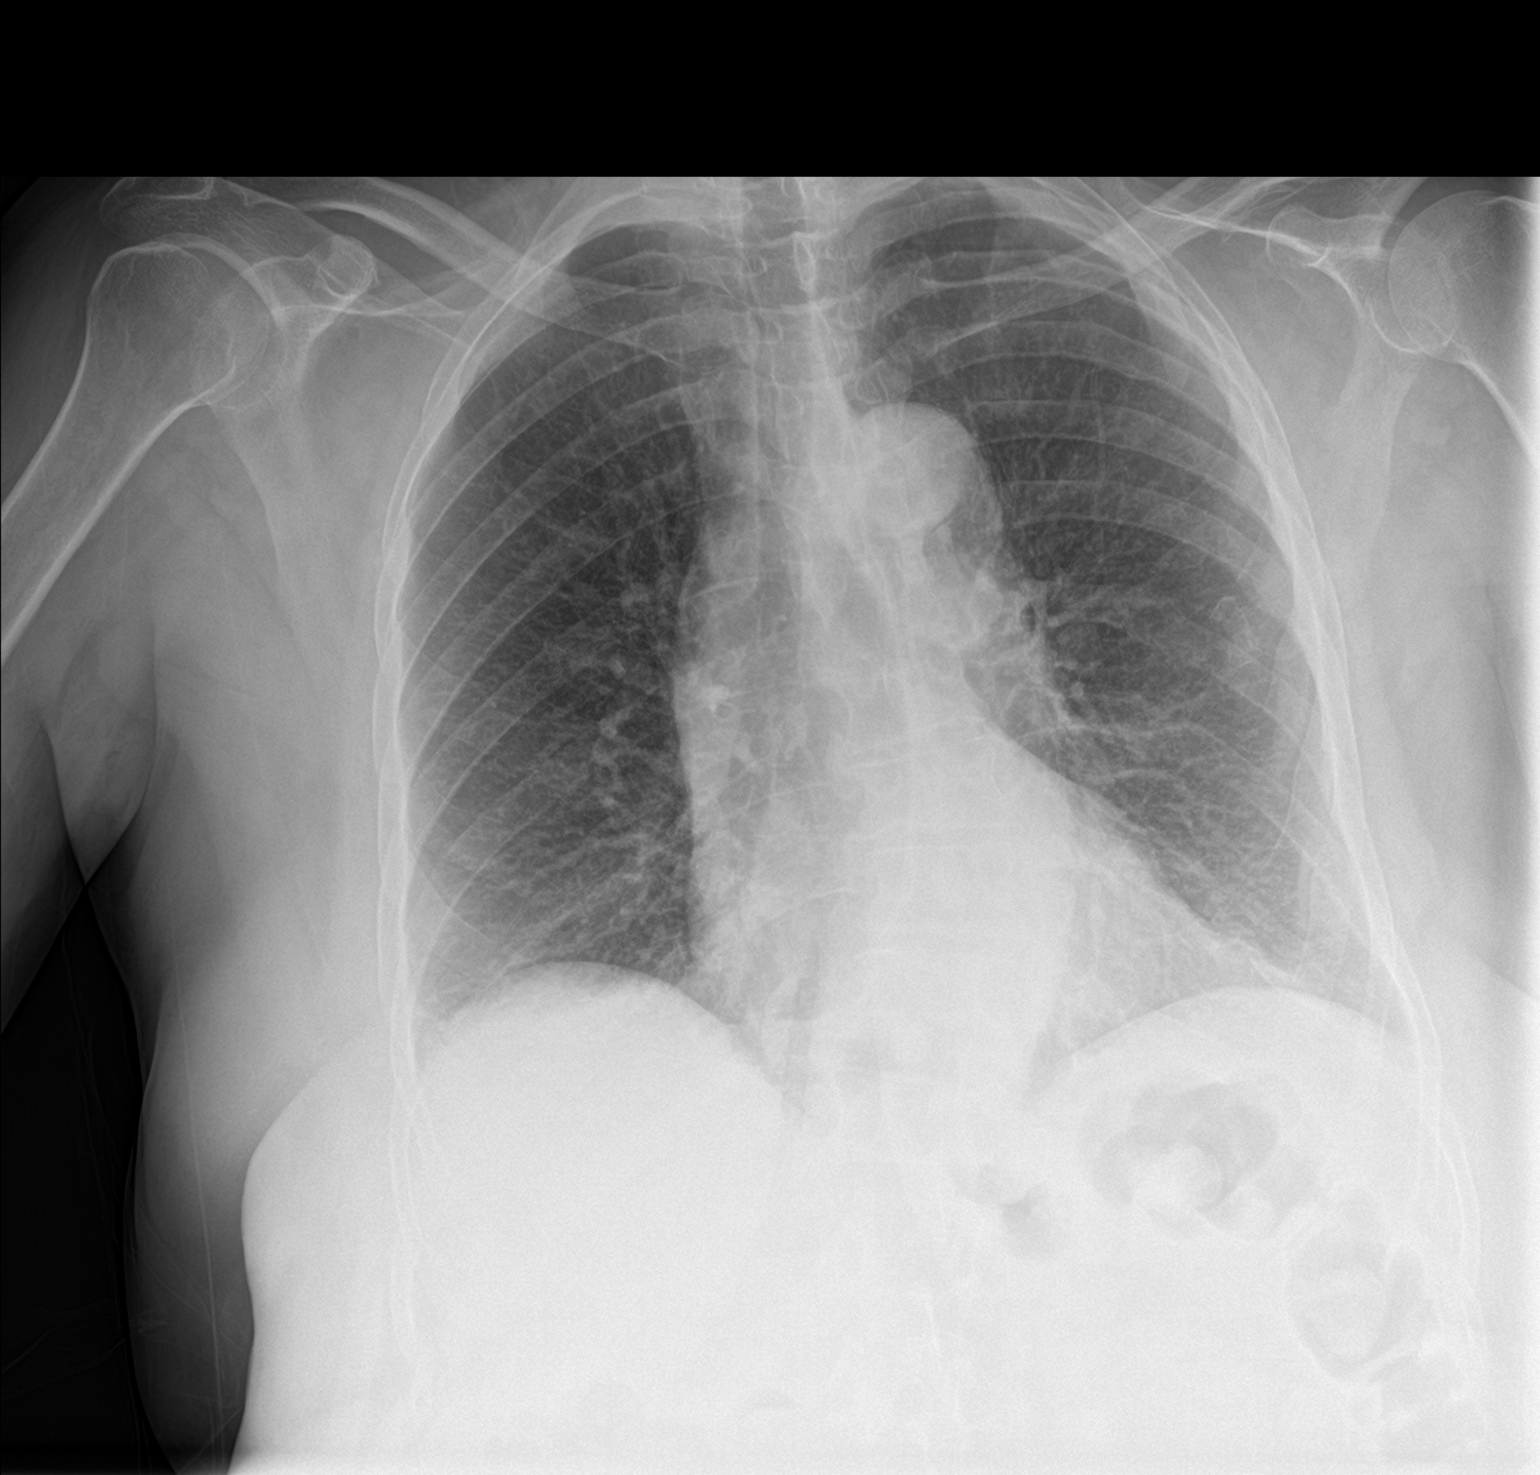

[chest lat]
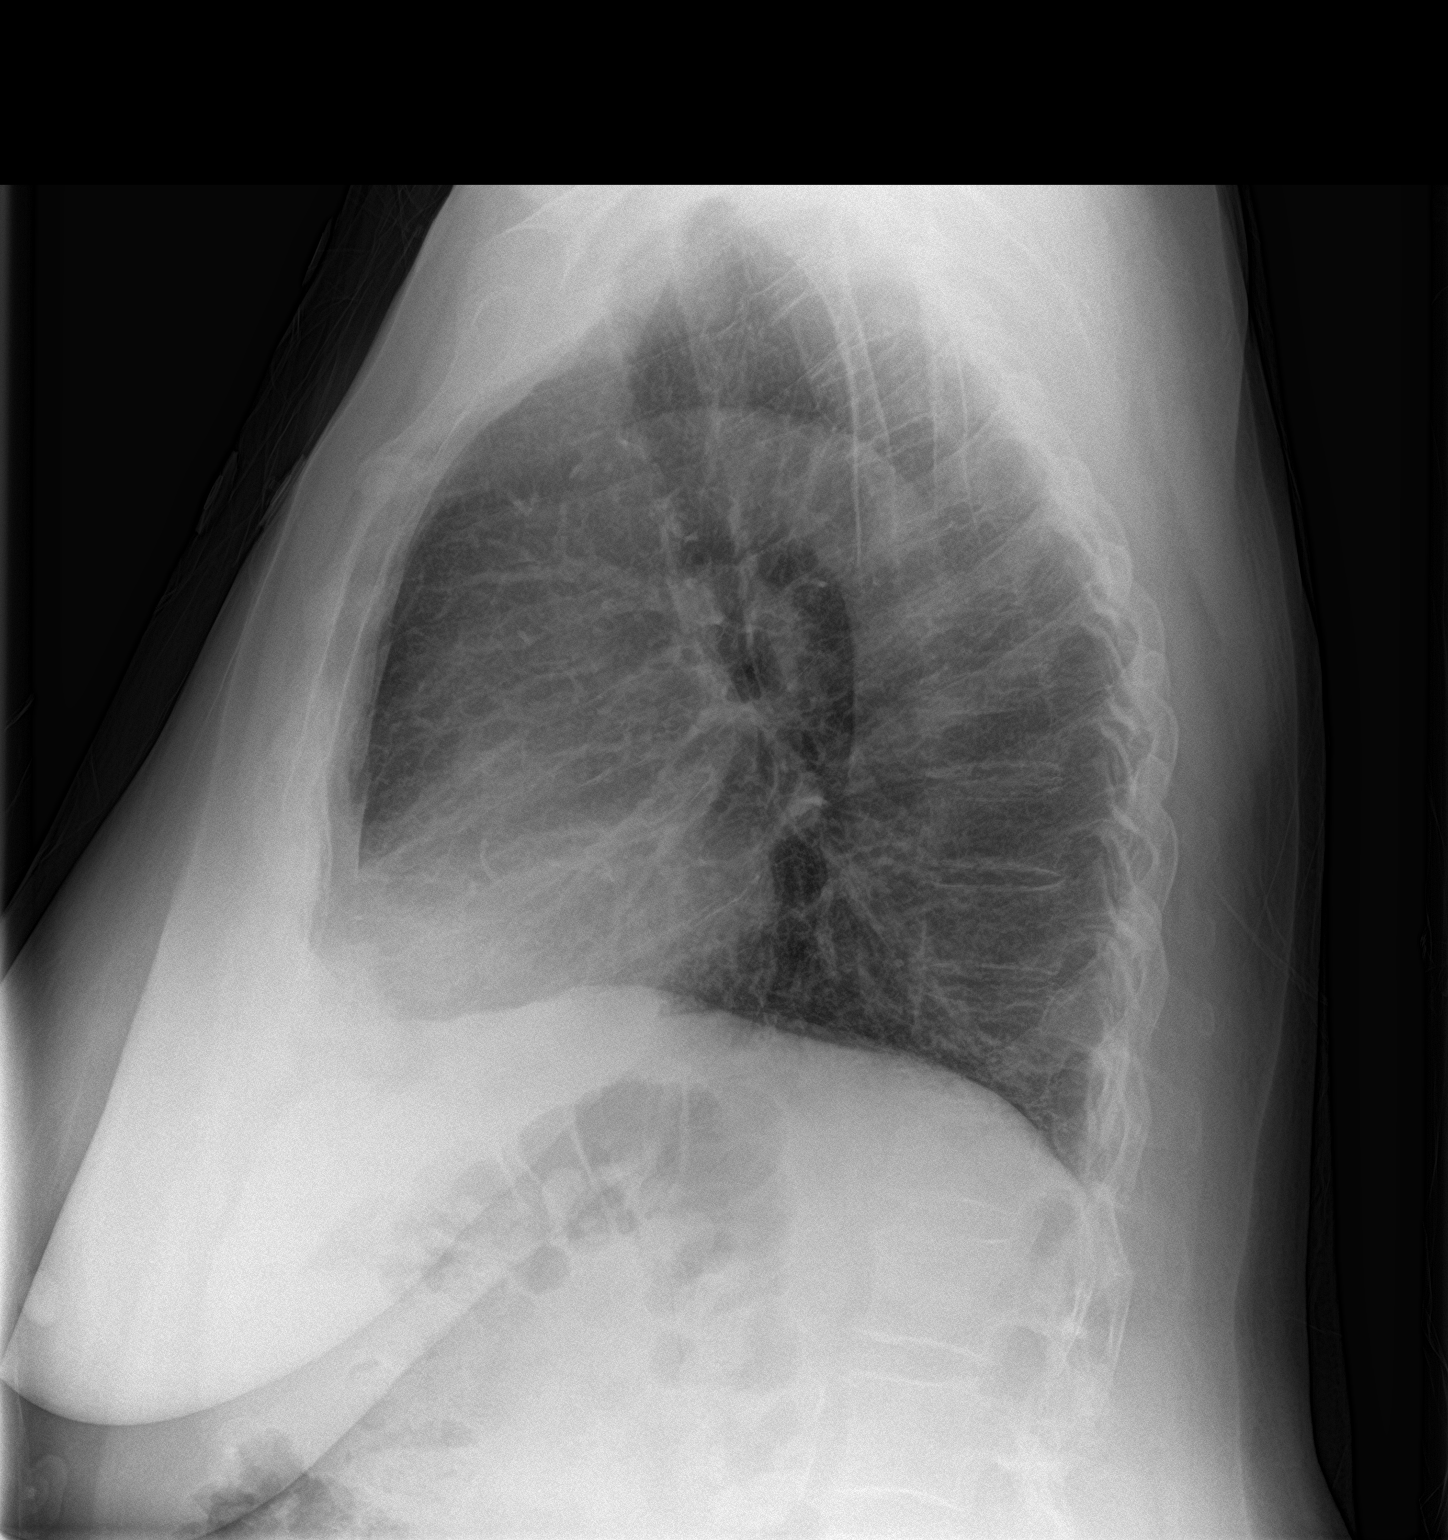

[2 of 2 positions shown; findings below may reference images not displayed]

FINDINGS: Lungs are adequately inflated without focal consolidation or
effusion. Cardiomediastinal silhouette is within normal. Fusion
hardware over the cervical spine within normal. Stable biphasic
curvature of the thoracolumbar spine with mild spondylosis.
IMPRESSION: No active cardiopulmonary disease.

## 2017-05-31 IMAGING — CR DG CHEST 2V
2 series · 2 of 2 positions shown · non-contrast
Comparison: 11/07/2015 and 08/09/2015.

CLINICAL DATA: Right lower abdominal pain with increasing urinary
frequency, elevated blood pressure and headaches.

EXAM:
CHEST  2 VIEW

[w chest pa]
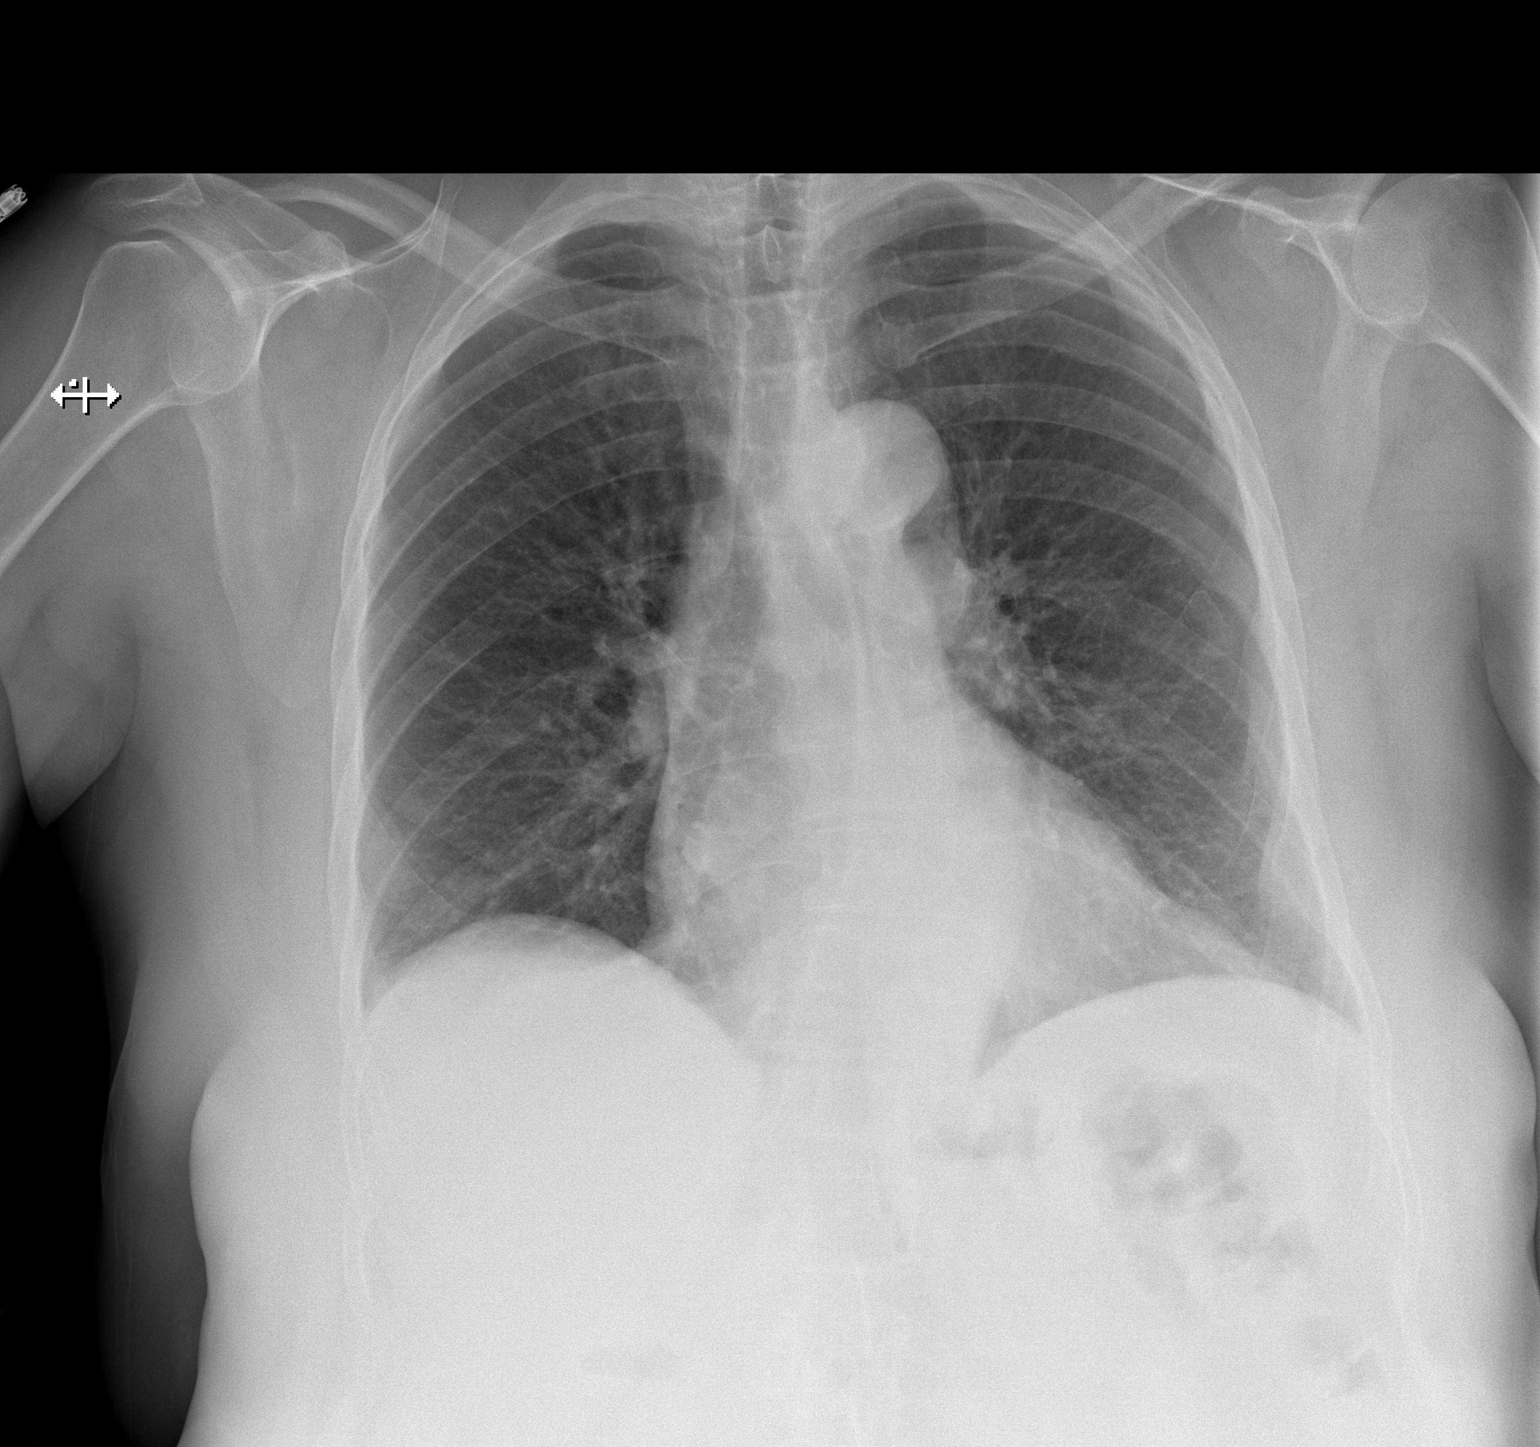

[w chest lat]
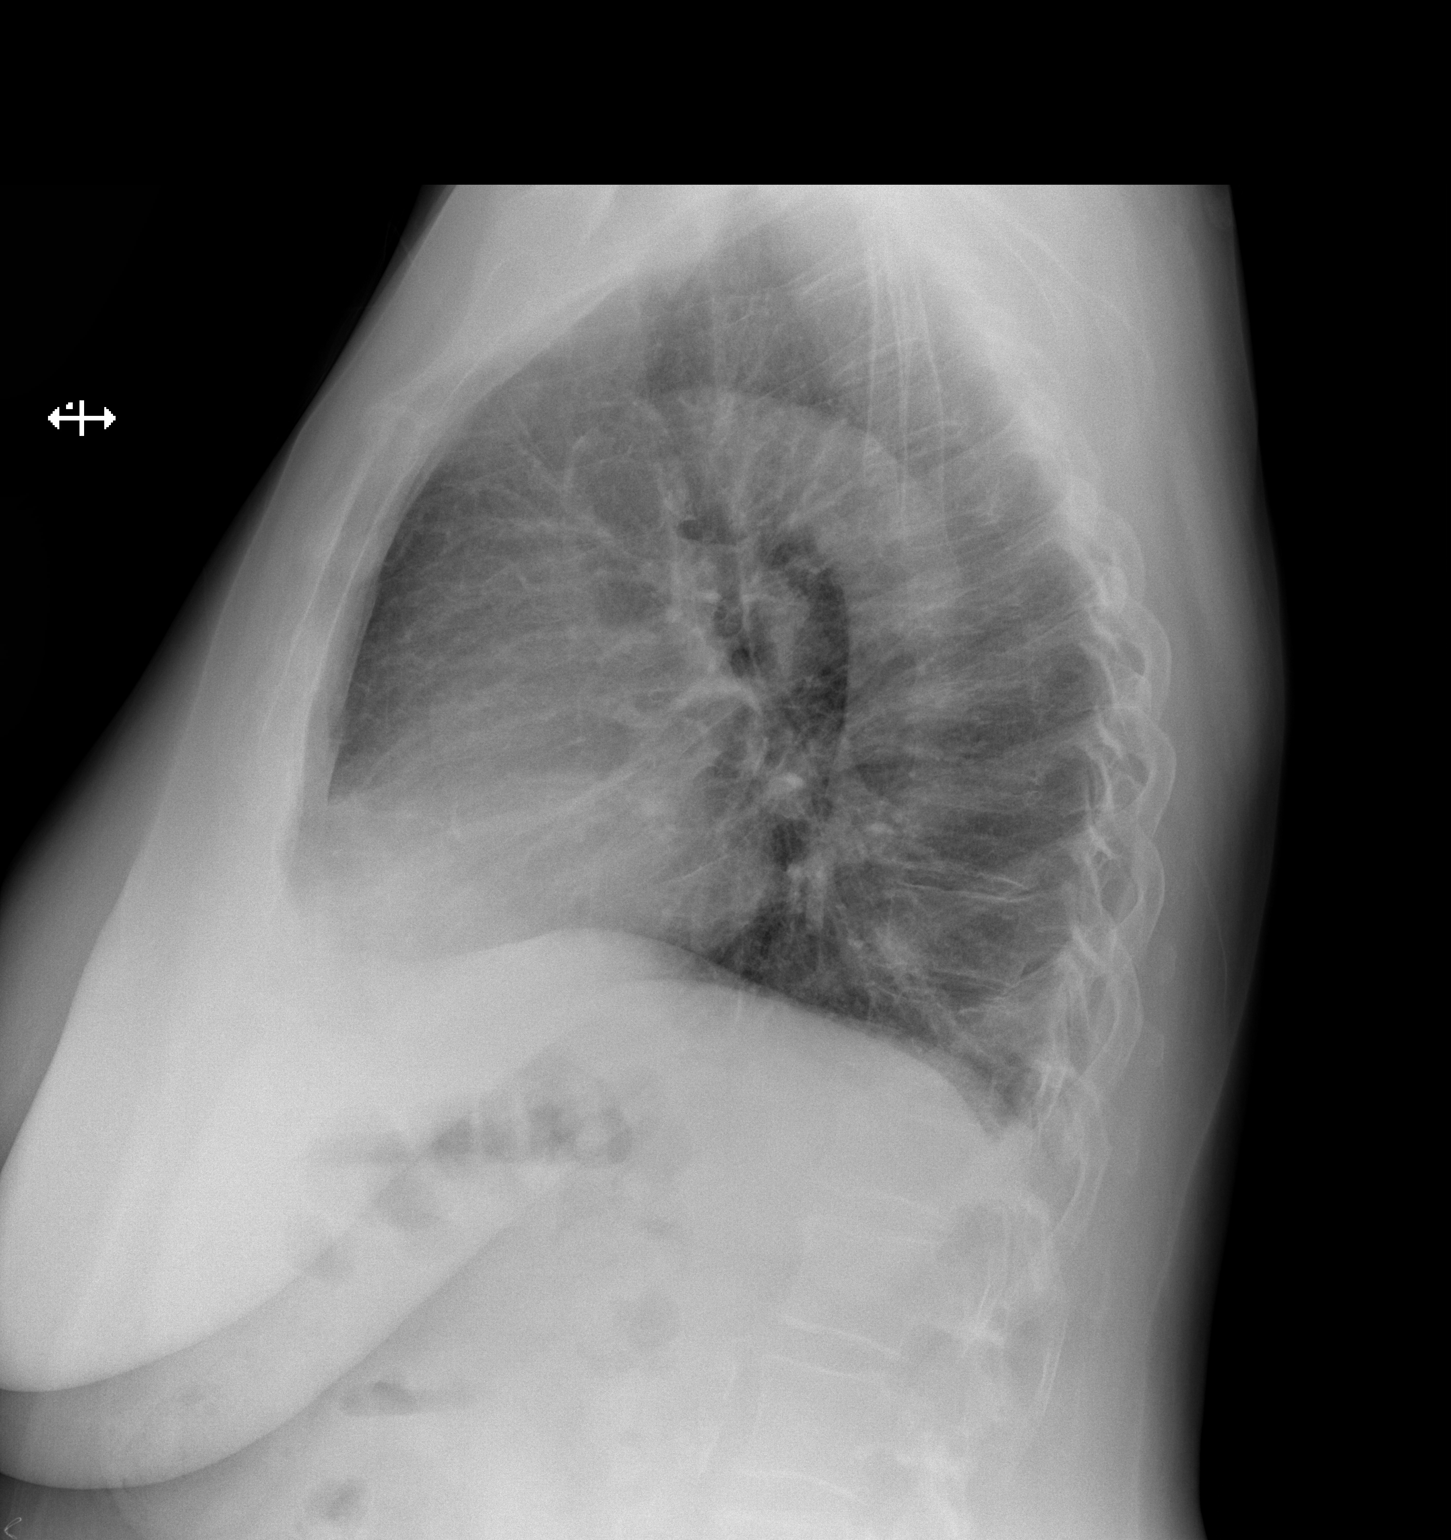

[2 of 2 positions shown; findings below may reference images not displayed]

FINDINGS: The heart size and mediastinal contours are normal. The lungs are
clear. There is no pleural effusion or pneumothorax. No acute
osseous findings are identified. Old rib fractures are present on
the left. Patient is status post lower cervical fusion.
IMPRESSION: Stable chest.  No active cardiopulmonary process.

## 2017-06-24 ENCOUNTER — Other Ambulatory Visit: Payer: Self-pay | Admitting: Internal Medicine

## 2017-06-24 DIAGNOSIS — Z1231 Encounter for screening mammogram for malignant neoplasm of breast: Secondary | ICD-10-CM

## 2017-08-03 ENCOUNTER — Ambulatory Visit: Payer: Medicaid Other

## 2017-09-25 IMAGING — CR DG WRIST COMPLETE 3+V*L*
4 series · 4 of 4 positions shown · non-contrast
Comparison: None.

CLINICAL DATA: Fell on [REDACTED] and complains of bilateral forearm
pain. Left wrist pain and loss of range of motion.

EXAM:
LEFT WRIST - COMPLETE 3+ VIEW

[x wrist pa left]
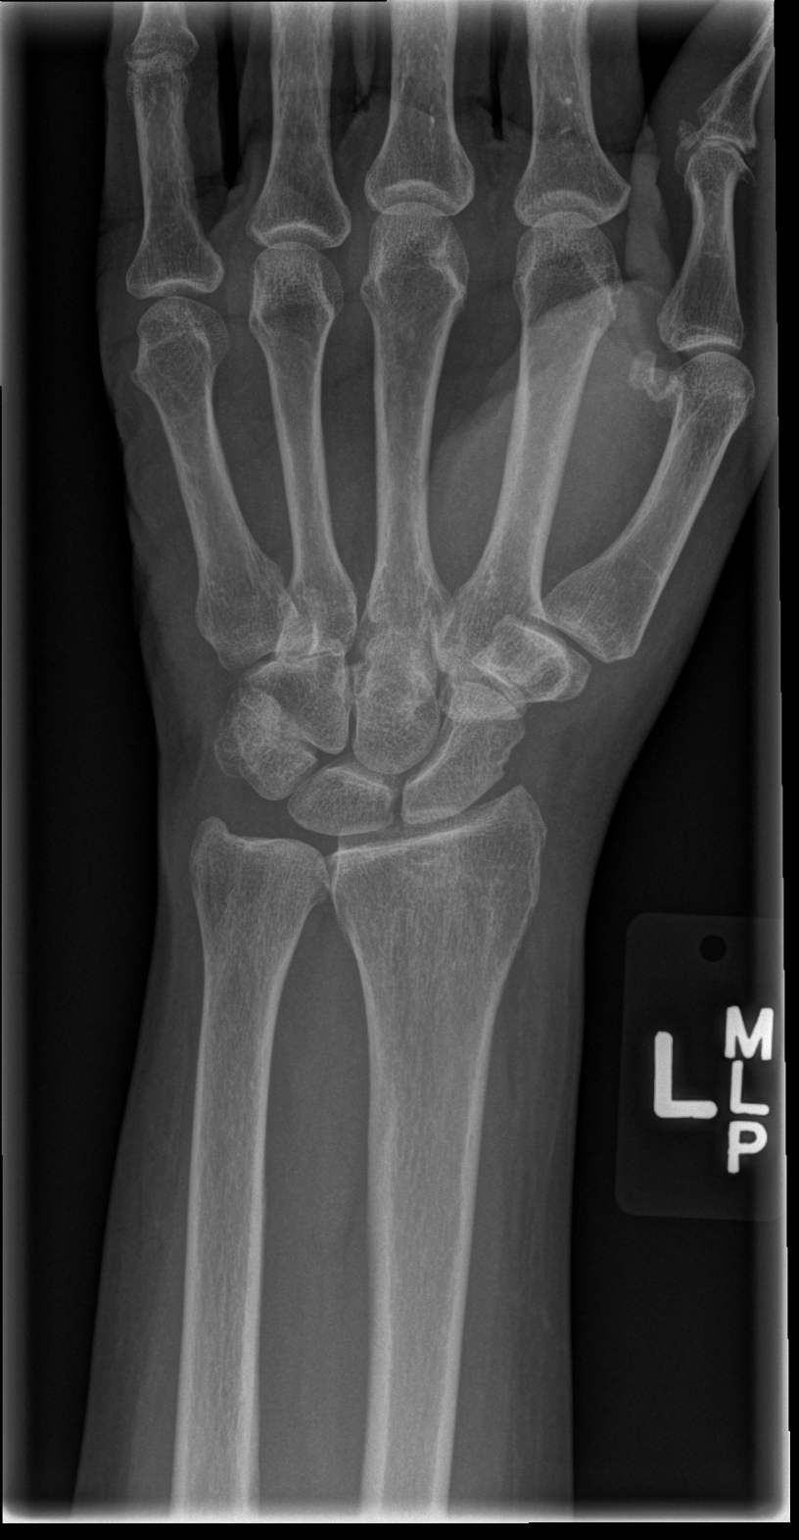

[x wrist obl left]
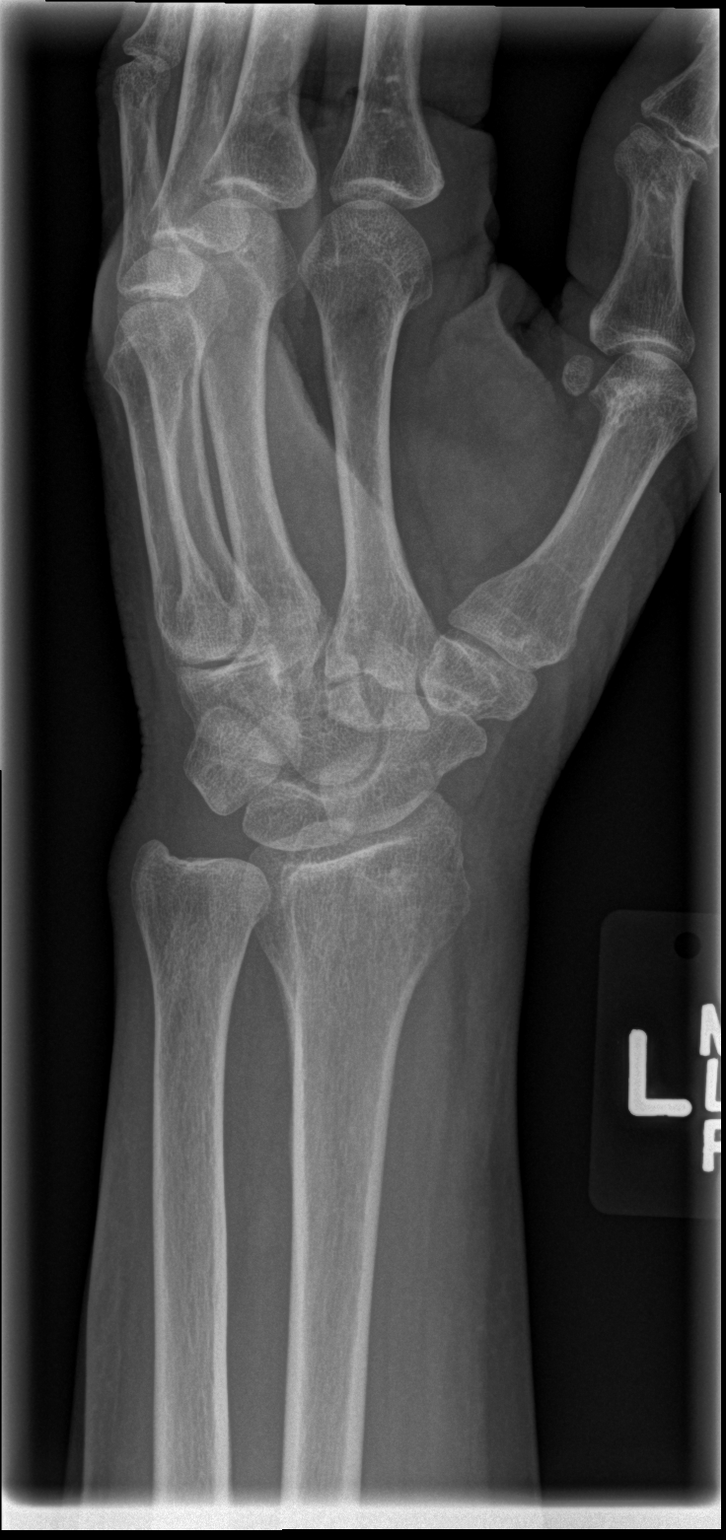

[x wrist lat left]
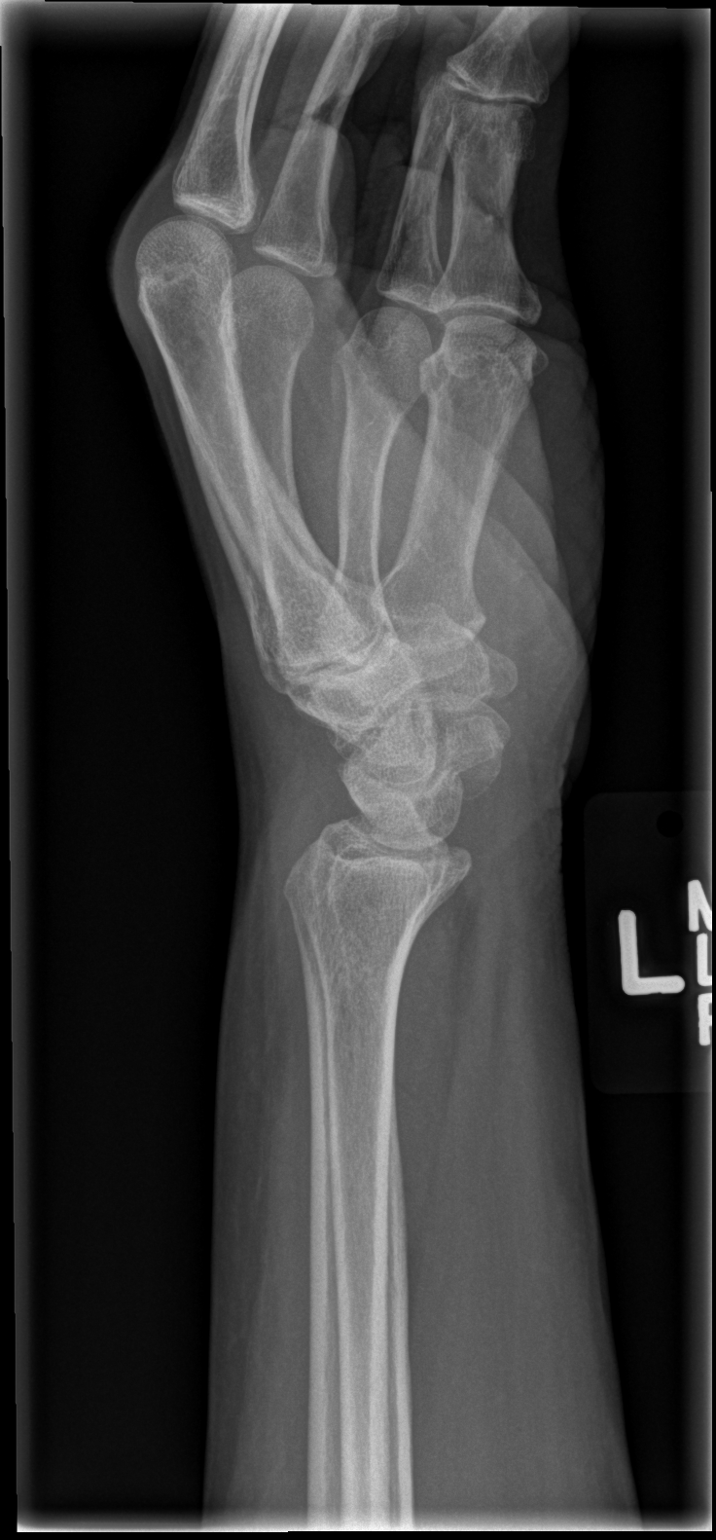

[x wrist navicular view left]
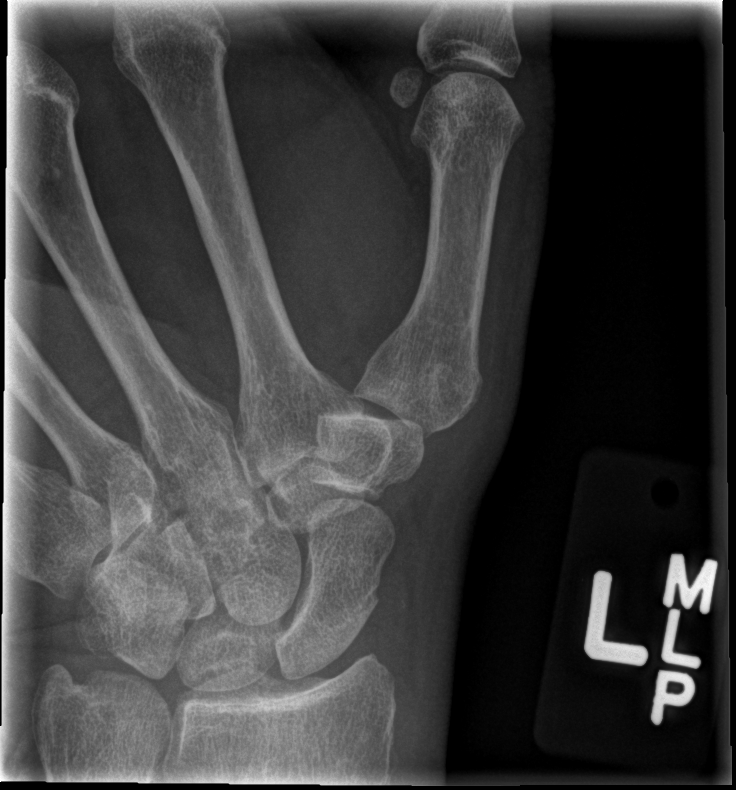

[4 of 4 positions shown; findings below may reference images not displayed]

FINDINGS: There is no evidence of fracture or dislocation. There is no
evidence of arthropathy or other focal bone abnormality. Soft
tissues are unremarkable.
IMPRESSION: No acute abnormality to the left wrist.

## 2017-09-25 IMAGING — CT CT ABD-PELV W/O CM
2 of 4 series · 17 of 46 positions shown, 19 images · non-contrast
Comparison: 06/29/2015

CLINICAL DATA: Diarrhea for 4 days. Vomiting yesterday. Fell 4 days
ago.

EXAM:
CT ABDOMEN AND PELVIS WITHOUT CONTRAST
TECHNIQUE: Multidetector CT imaging of the abdomen and pelvis was performed
following the standard protocol without IV contrast.

[Series 2: abd/pel w/o · axial · non-contrast · 0.73mm/px · z∈[-696,-286]mm · 14 of 92 slices shown, 16 images]
[im 5/92  soft-tissue]
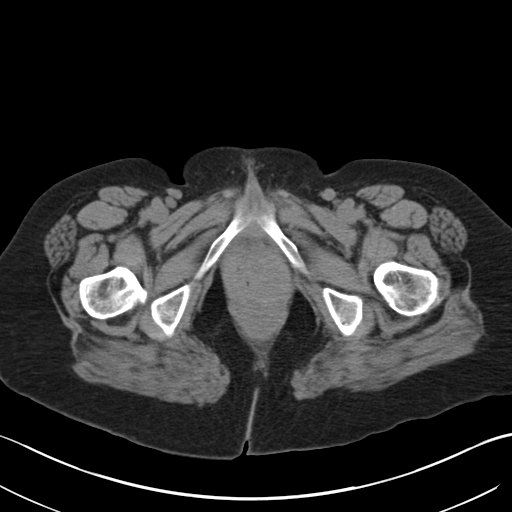
[im 5/92  bone]
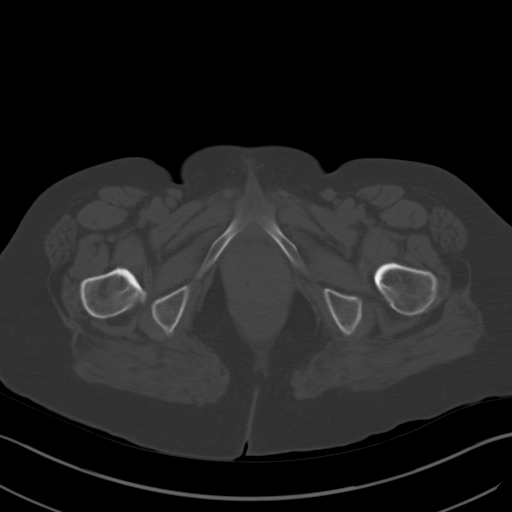
[im 10/92  soft-tissue]
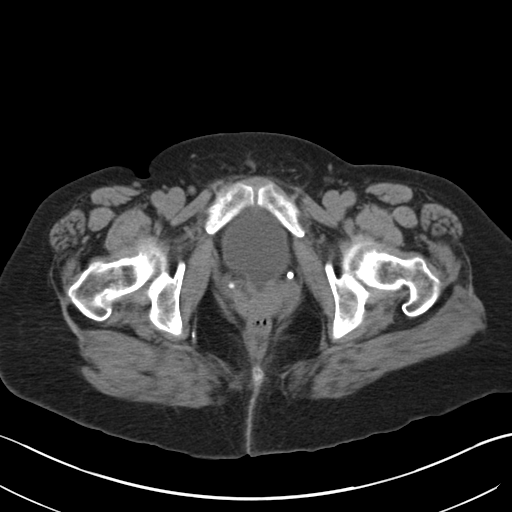
[im 20/92  soft-tissue]
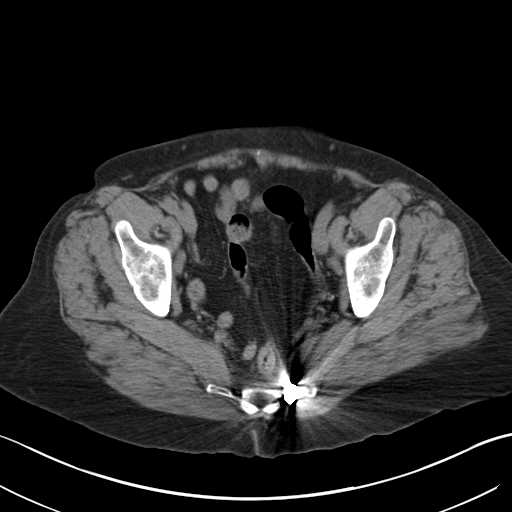
[im 24/92  soft-tissue]
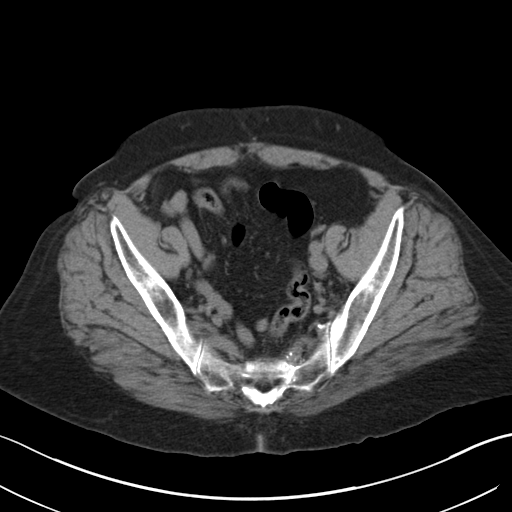
[im 29/92  soft-tissue]
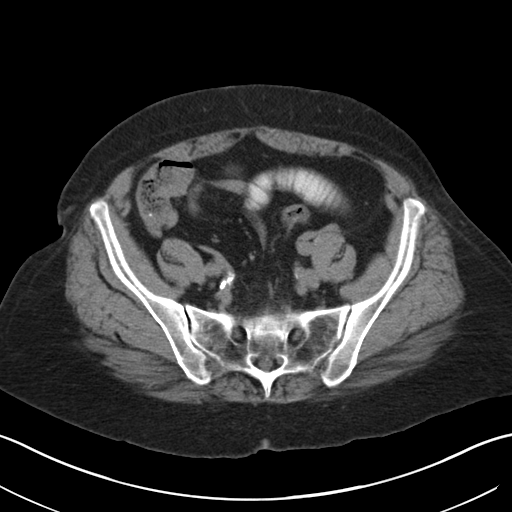
[im 39/92  soft-tissue]
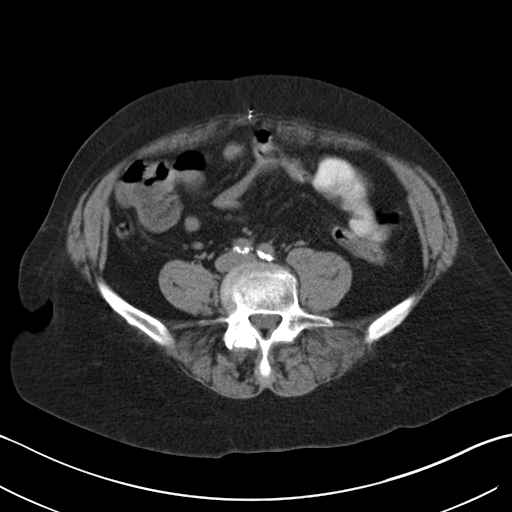
[im 44/92  soft-tissue]
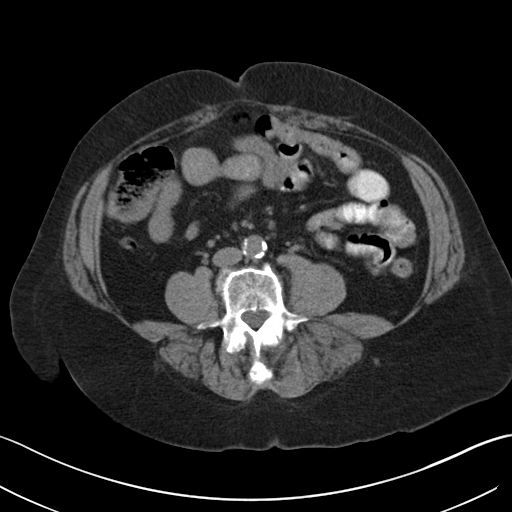
[im 48/92  soft-tissue]
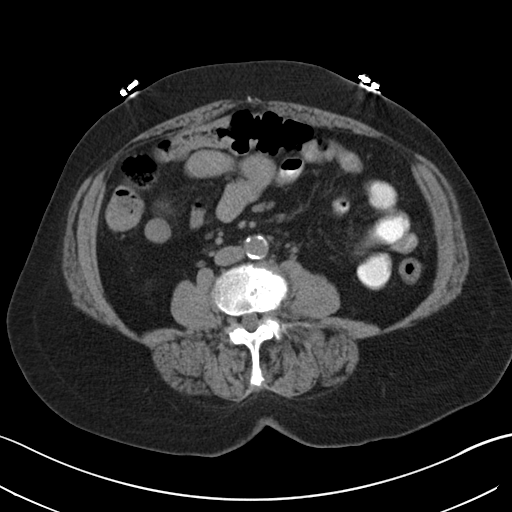
[im 53/92  soft-tissue]
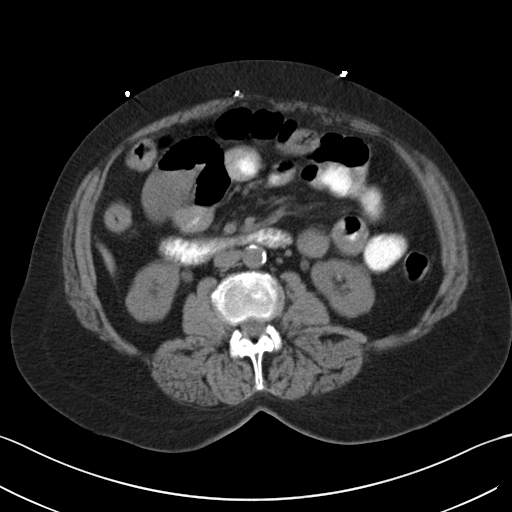
[im 53/92  bone]
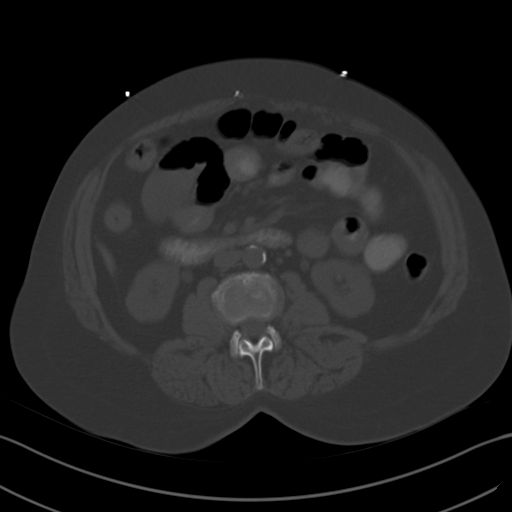
[im 63/92  soft-tissue]
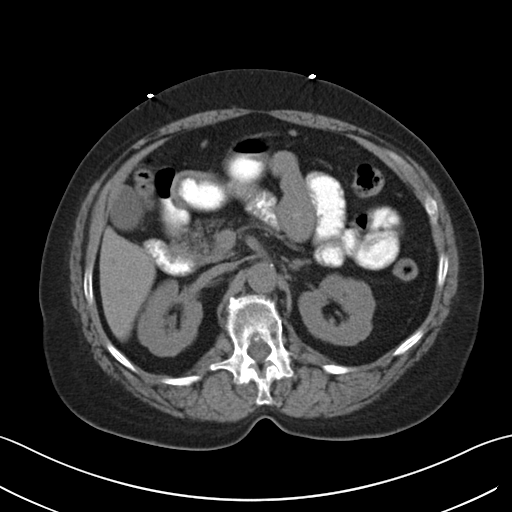
[im 68/92  soft-tissue]
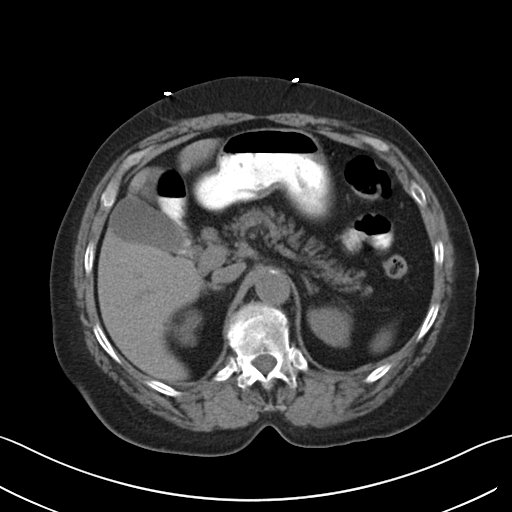
[im 72/92  soft-tissue]
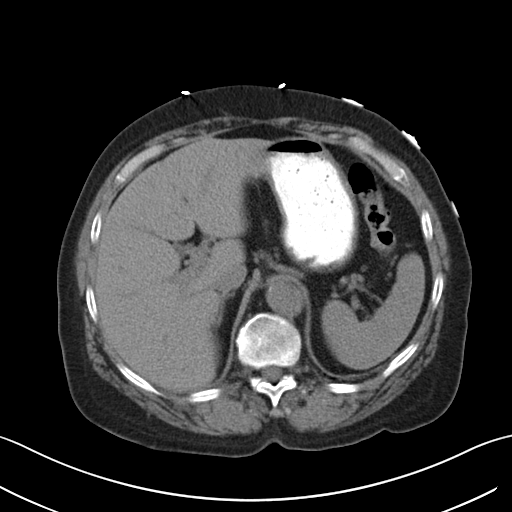
[im 82/92  soft-tissue]
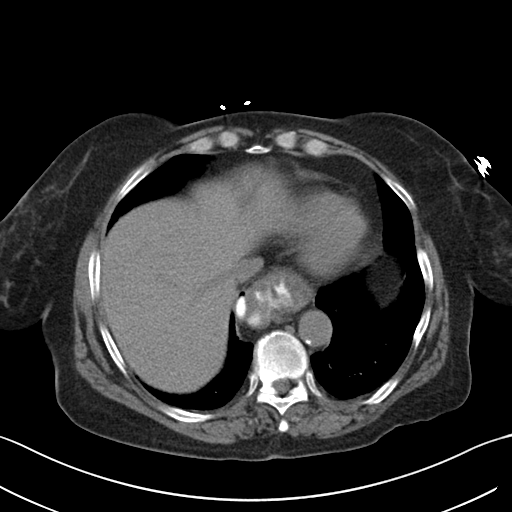
[im 87/92  soft-tissue]
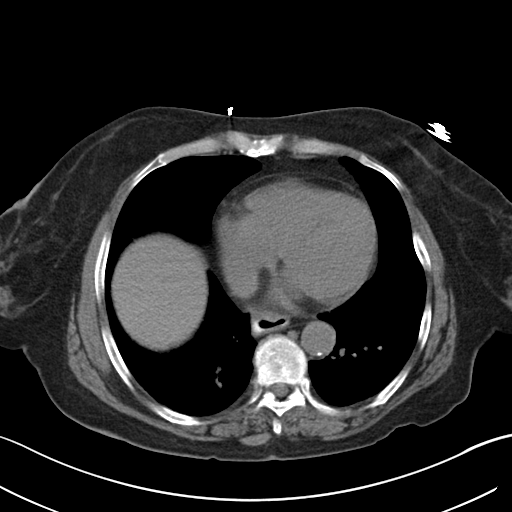

[Series 3: coronal · coronal · 0.85mm/px · 3 of 137 slices shown]
[im 46/137  soft-tissue]
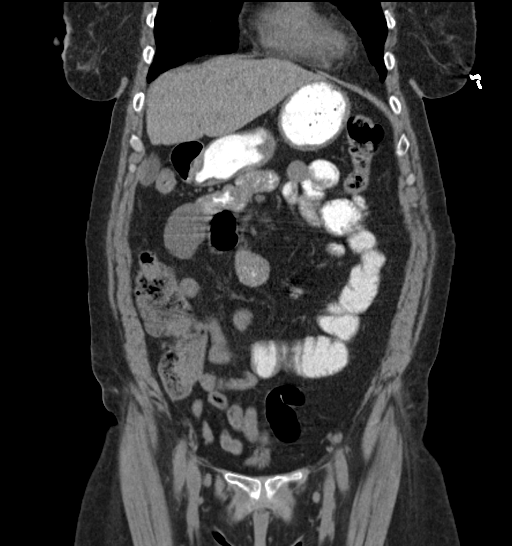
[im 61/137  soft-tissue]
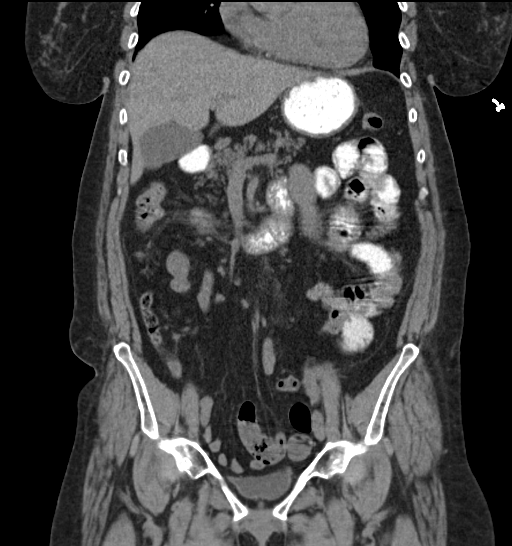
[im 76/137  soft-tissue]
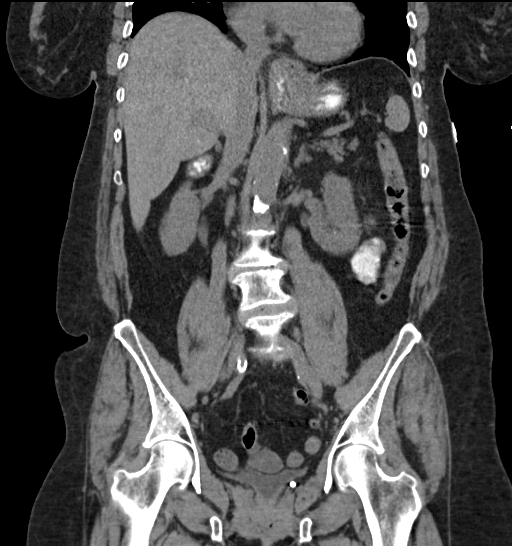

[17 of 46 positions shown; findings below may reference images not displayed]

FINDINGS: Lower chest: No acute abnormality.

Hepatobiliary: No focal liver abnormality is seen. No gallstones,
gallbladder wall thickening, or biliary dilatation.

Pancreas: Unremarkable. No pancreatic ductal dilatation or
surrounding inflammatory changes.

Spleen: Normal in size without focal abnormality.

Adrenals/Urinary Tract: No significant parenchymal renal lesions.
Unchanged hydronephrosis and ureteral dilatation on the right
without evidence of obstructing mass or stone. Left collecting
system and ureter are unremarkable. Urinary bladder is unremarkable.
Both adrenals are normal.

Stomach/Bowel: Large hiatal hernia, unchanged. Small bowel and colon
are unremarkable. No bowel obstruction. No bowel inflammation.

Vascular/Lymphatic: The abdominal aorta is normal in caliber with
moderate atherosclerotic calcification.

Reproductive: Hysterectomy. No adnexal abnormalities. Probable
pelvic floor laxity.

Other: No focal inflammation. No ascites. Small fat containing
umbilical hernia.

Musculoskeletal: No significant skeletal lesions.
IMPRESSION: 1. No acute findings are evident.
2. Large hiatal hernia.
3. Mild right hydronephrosis and ureteral dilatation, long-standing.
Unchanged from 06/29/2015.

## 2017-09-25 IMAGING — CR DG CHEST 2V
2 series · 2 of 2 positions shown · non-contrast
Comparison: 11/19/2015 chest radiograph

CLINICAL DATA: 60 y/o  F; intermittent chest pain.

EXAM:
CHEST  2 VIEW

[w chest pa]
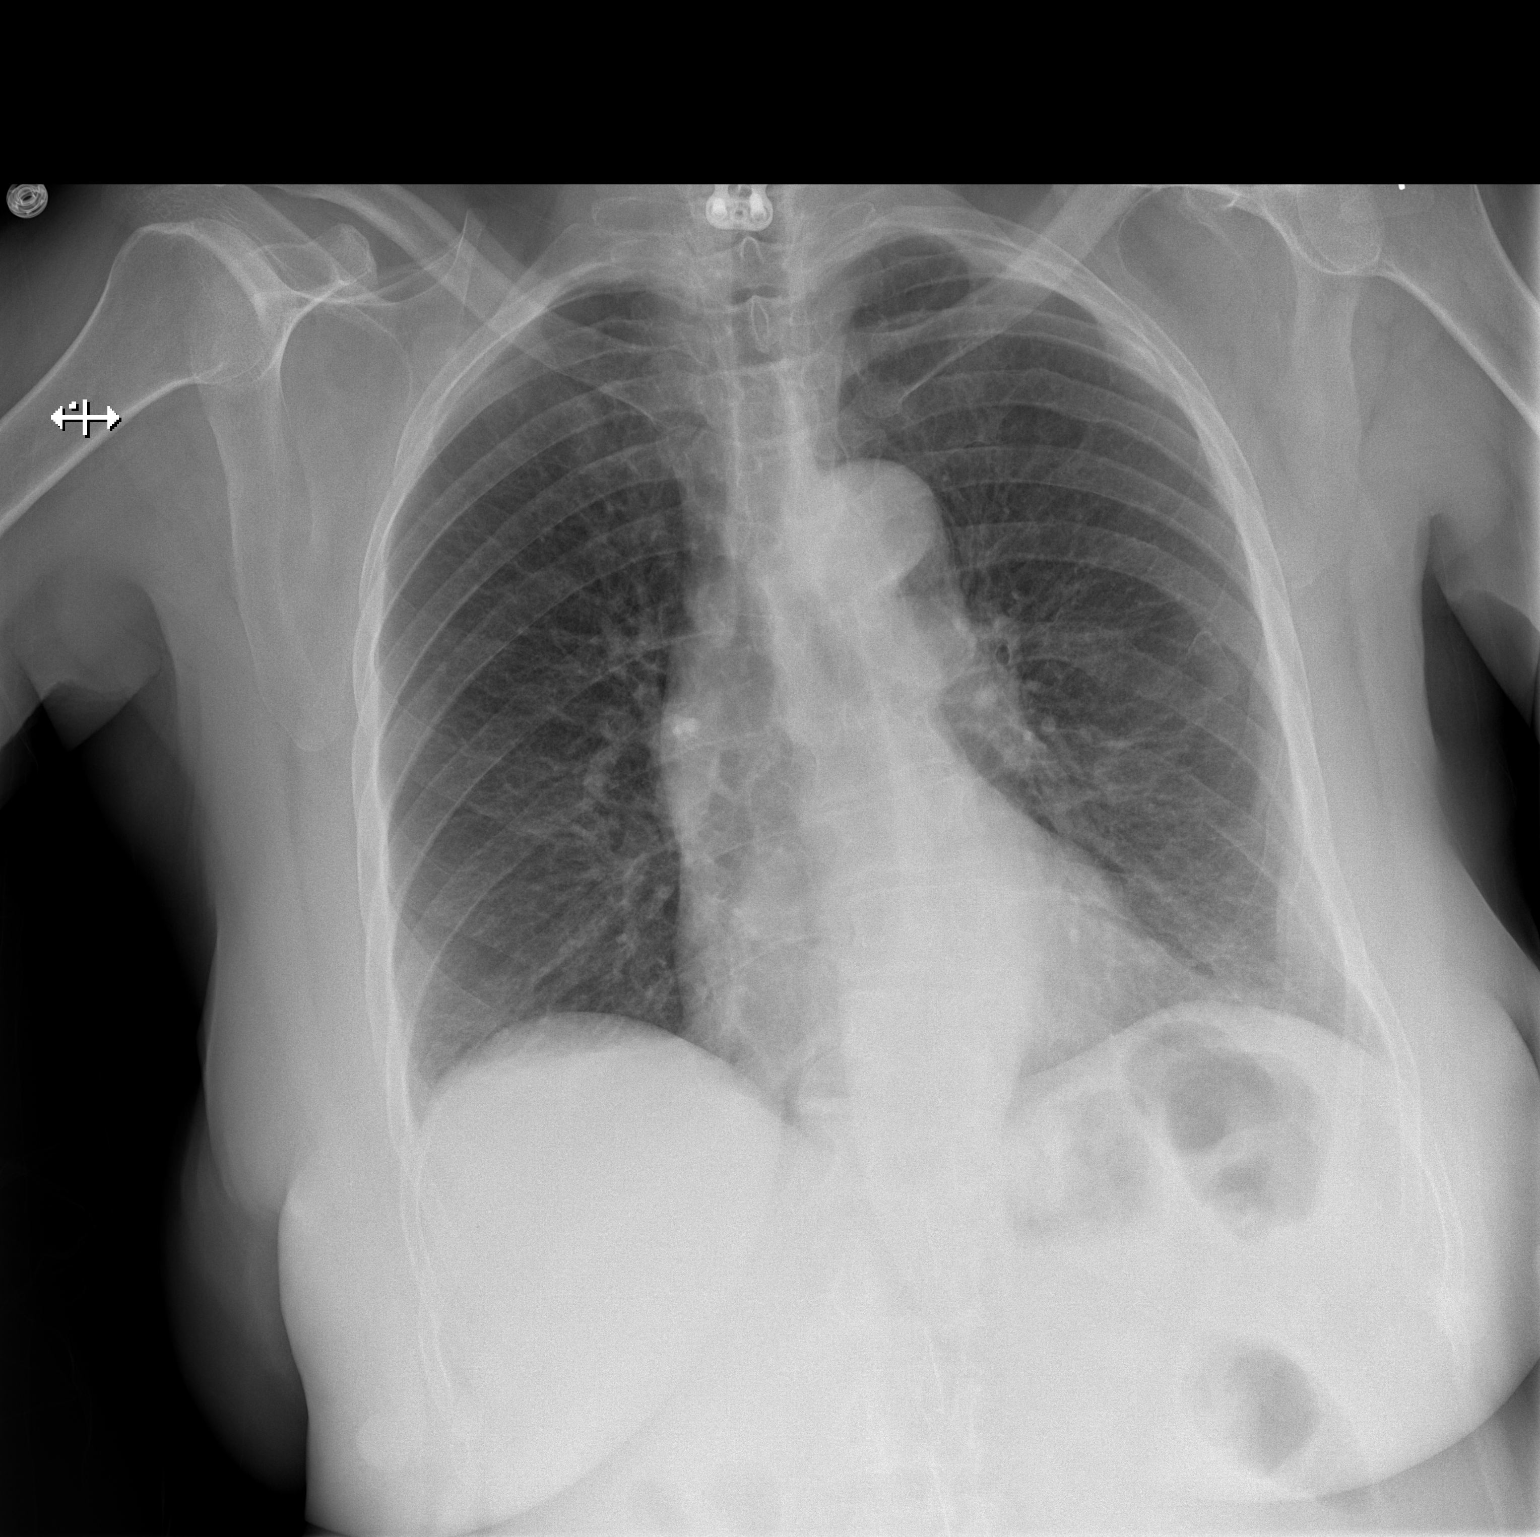

[w chest lat]
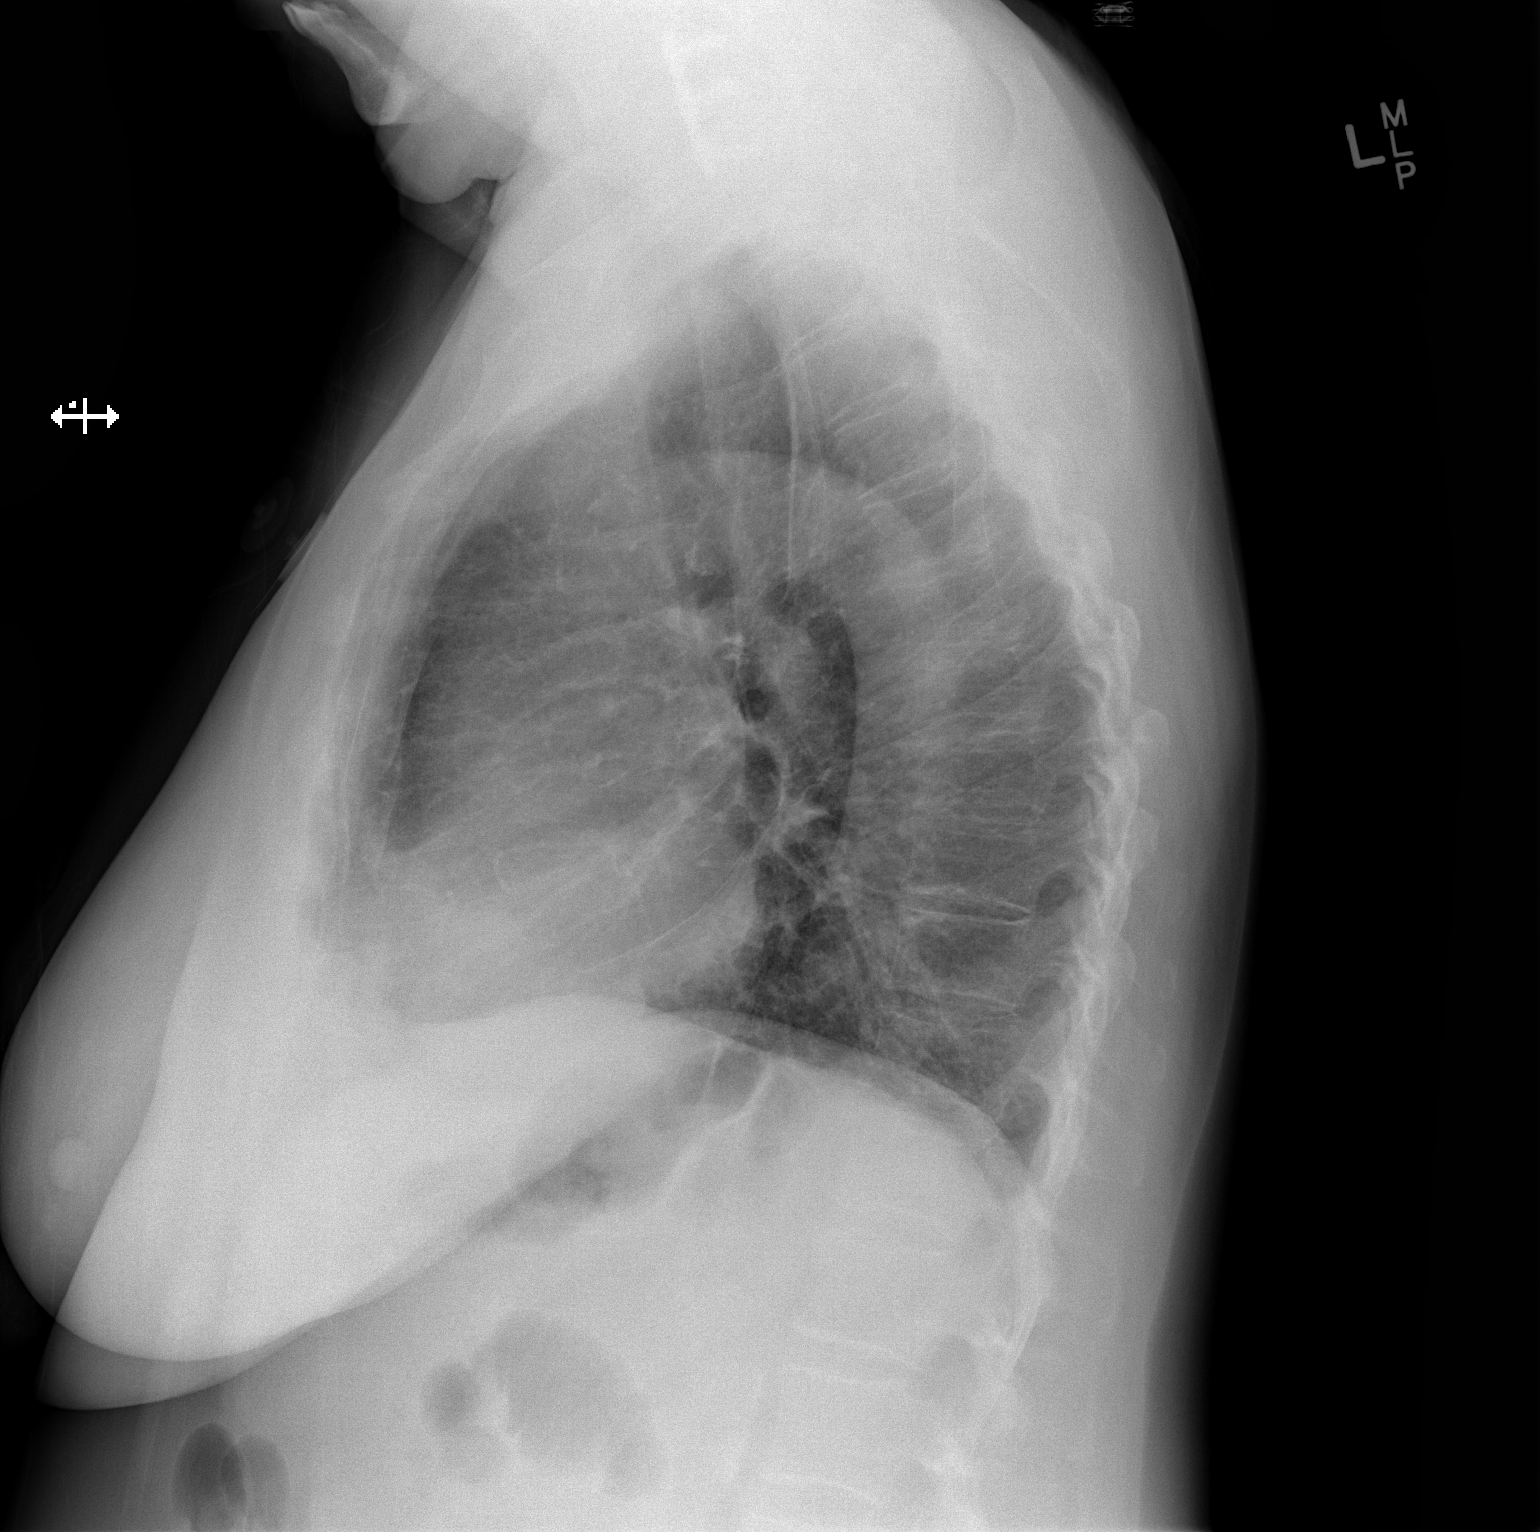

[2 of 2 positions shown; findings below may reference images not displayed]

FINDINGS: Stable normal cardiac silhouette. Partially visualized anterior
cervical fusion hardware. Clear lungs. No pleural effusion or
pneumothorax. Thoracic spine levocurvature. No acute osseous
abnormality identified.
IMPRESSION: No active cardiopulmonary disease.

By: Risto-Robin Severjanen M.D.

## 2017-10-06 ENCOUNTER — Other Ambulatory Visit: Payer: Self-pay | Admitting: Cardiology

## 2017-10-26 ENCOUNTER — Emergency Department (HOSPITAL_COMMUNITY)
Admission: EM | Admit: 2017-10-26 | Discharge: 2017-10-26 | Disposition: A | Discharge status: Home / Self Care | Payer: Medicaid Other | Attending: Emergency Medicine | Admitting: Emergency Medicine

## 2017-10-26 ENCOUNTER — Encounter (HOSPITAL_COMMUNITY): Payer: Self-pay | Admitting: Emergency Medicine

## 2017-10-26 ENCOUNTER — Other Ambulatory Visit: Payer: Self-pay

## 2017-10-26 DIAGNOSIS — N183 Chronic kidney disease, stage 3 (moderate): Secondary | ICD-10-CM | POA: Diagnosis not present

## 2017-10-26 DIAGNOSIS — M069 Rheumatoid arthritis, unspecified: Secondary | ICD-10-CM

## 2017-10-26 DIAGNOSIS — J449 Chronic obstructive pulmonary disease, unspecified: Secondary | ICD-10-CM | POA: Diagnosis not present

## 2017-10-26 DIAGNOSIS — I129 Hypertensive chronic kidney disease with stage 1 through stage 4 chronic kidney disease, or unspecified chronic kidney disease: Secondary | ICD-10-CM | POA: Insufficient documentation

## 2017-10-26 DIAGNOSIS — Z79899 Other long term (current) drug therapy: Secondary | ICD-10-CM | POA: Diagnosis not present

## 2017-10-26 DIAGNOSIS — F17210 Nicotine dependence, cigarettes, uncomplicated: Secondary | ICD-10-CM | POA: Diagnosis not present

## 2017-10-26 DIAGNOSIS — M2550 Pain in unspecified joint: Secondary | ICD-10-CM | POA: Diagnosis present

## 2017-10-26 MED ORDER — PREDNISONE 10 MG (21) PO TBPK: ORAL_TABLET | Freq: Every day | ORAL | 0 refills | Status: DC

## 2017-10-26 MED ORDER — OXYCODONE-ACETAMINOPHEN 5-325 MG PO TABS
2.0000 | ORAL_TABLET | Freq: Once | ORAL | Status: AC
  Administered 2017-10-26: 2 via ORAL
  Filled 2017-10-26: qty 2

## 2017-10-26 MED ORDER — PREDNISONE 20 MG PO TABS
60.0000 mg | ORAL_TABLET | Freq: Once | ORAL | Status: AC
  Administered 2017-10-26: 60 mg via ORAL
  Filled 2017-10-26: qty 3

## 2017-10-26 MED ORDER — PREDNISONE 10 MG (21) PO TBPK: ORAL_TABLET | ORAL | 0 refills | Status: DC
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
## 2017-10-26 NOTE — ED Triage Notes (Signed)
Patient c/o joint pain "all over." Hx chronic pain. Reports she lost bottle of pain pills x1 month ago and pain clinic would not refill.

## 2017-10-26 NOTE — Discharge Instructions (Signed)
Take Tylenol for pain.  You will need a referral from you primary care doctor to Rheumatology and to Pain management.  Get help right away if: You have chest pain. You have trouble breathing. You quickly develop a hot, painful joint that is more severe than your usual joint aches.

## 2017-12-05 ENCOUNTER — Ambulatory Visit: Payer: Medicaid Other

## 2018-02-12 IMAGING — MG 2D DIGITAL SCREENING BILATERAL MAMMOGRAM WITH CAD AND ADJUNCT TO
8 of 12 series · 8 of 28 positions shown · non-contrast
Comparison: Previous exam(s).

CLINICAL DATA: Screening.

EXAM:
2D DIGITAL SCREENING BILATERAL MAMMOGRAM WITH CAD AND ADJUNCT TOMO

[R MLO]
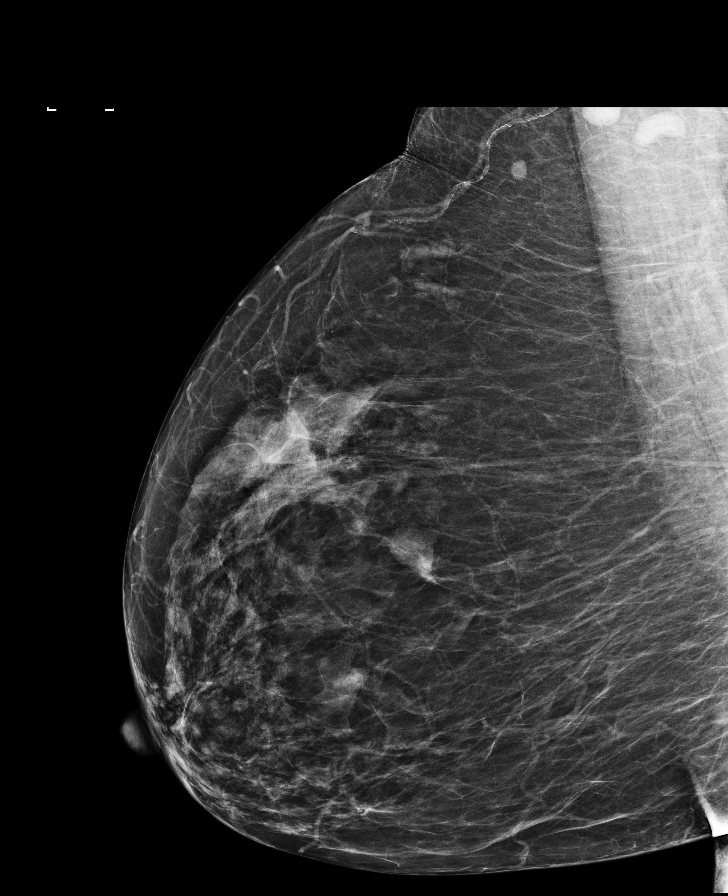

[L CC]
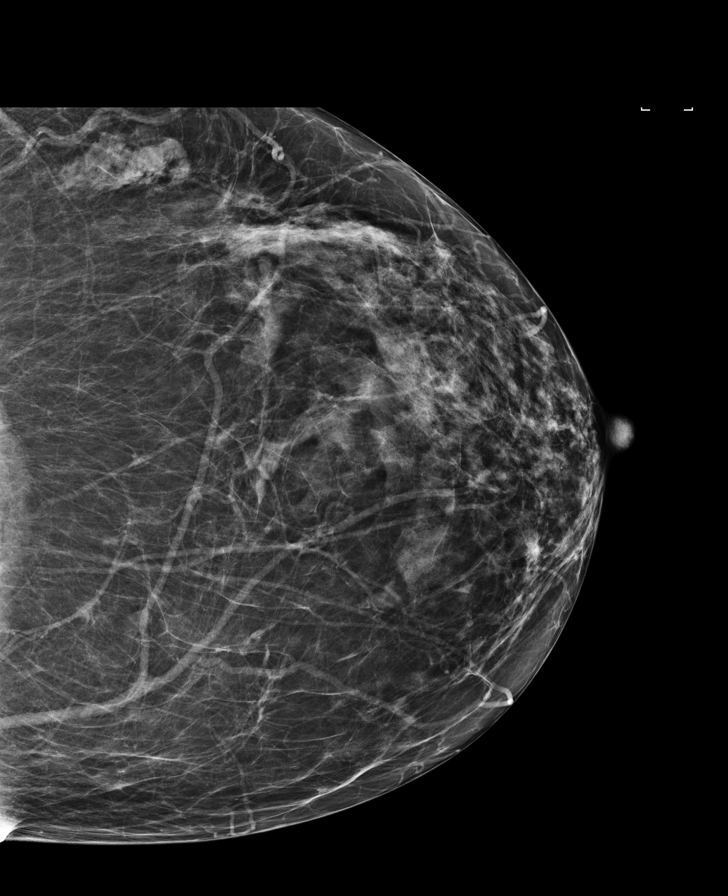

[L MLO]
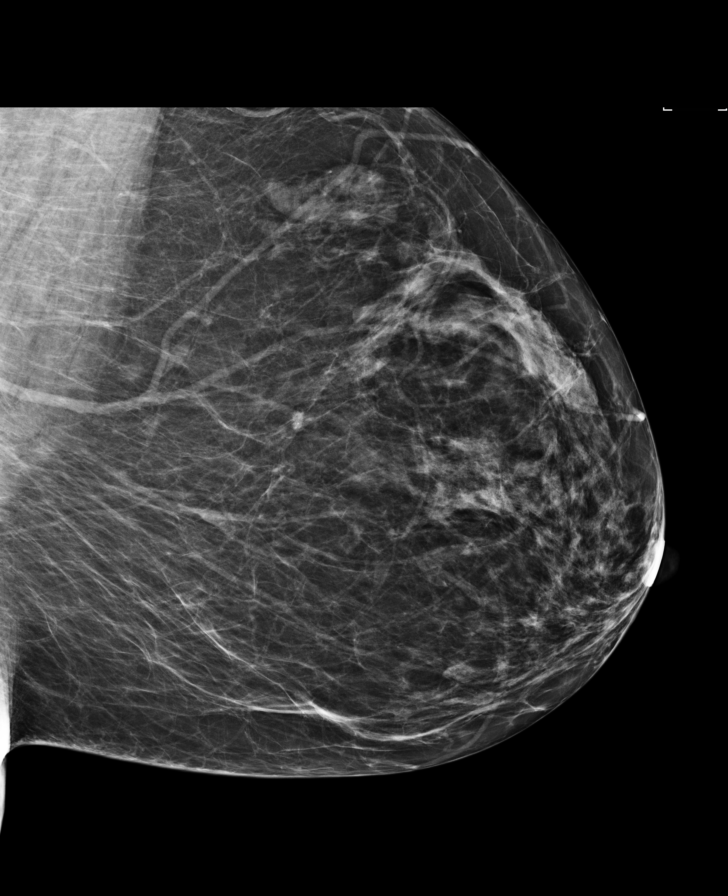

[L CC synth-2D]
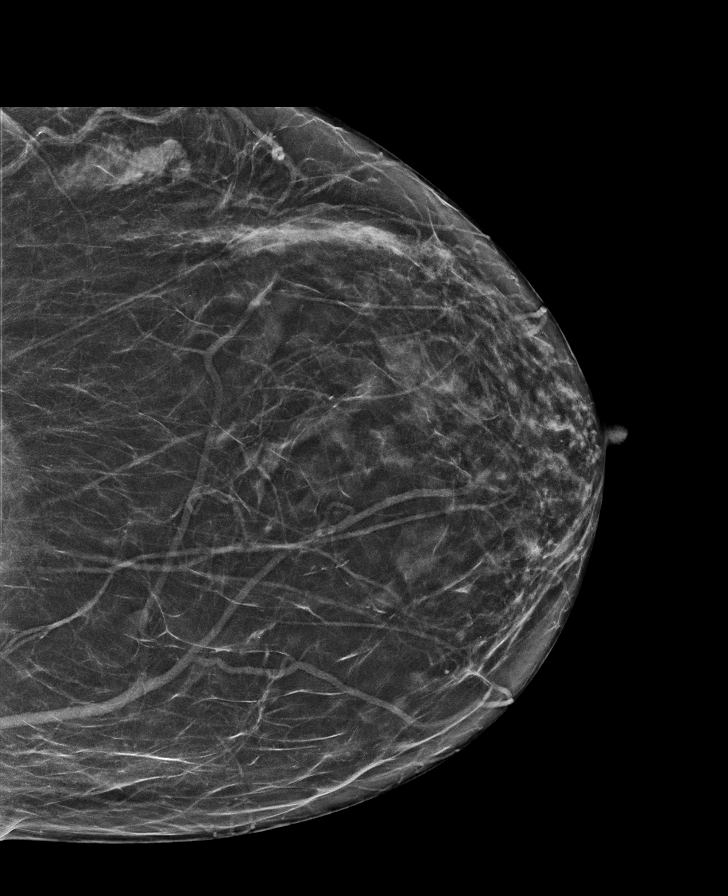

[L MLO synth-2D]
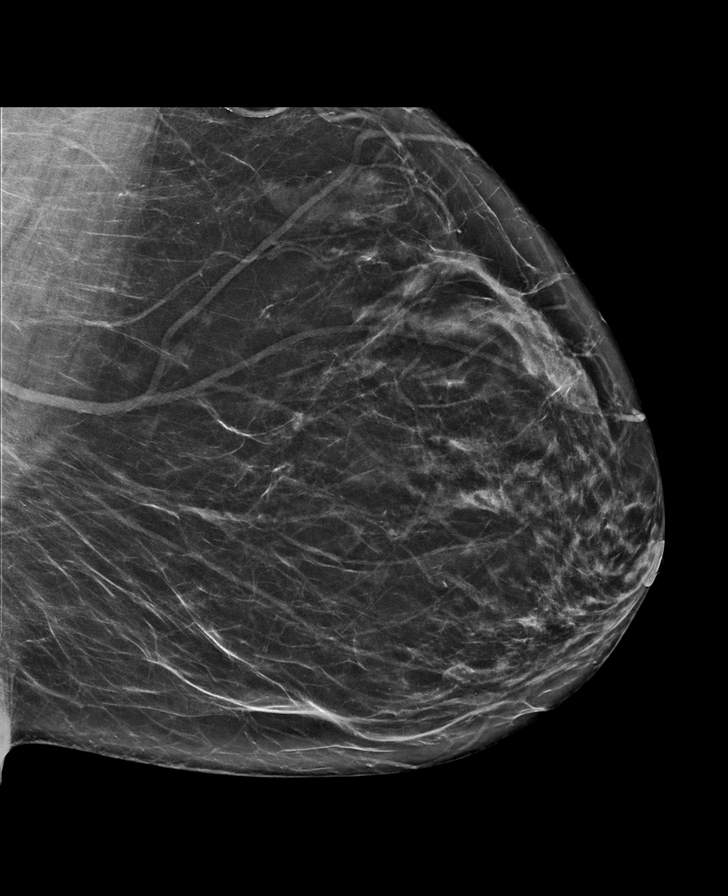

[R MLO synth-2D]
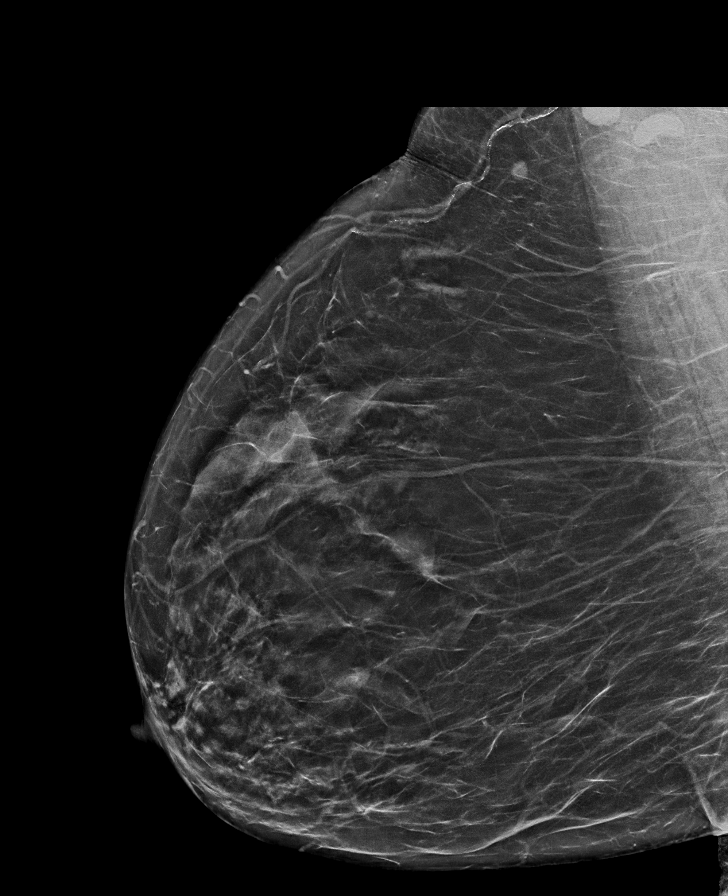

[R CC synth-2D]
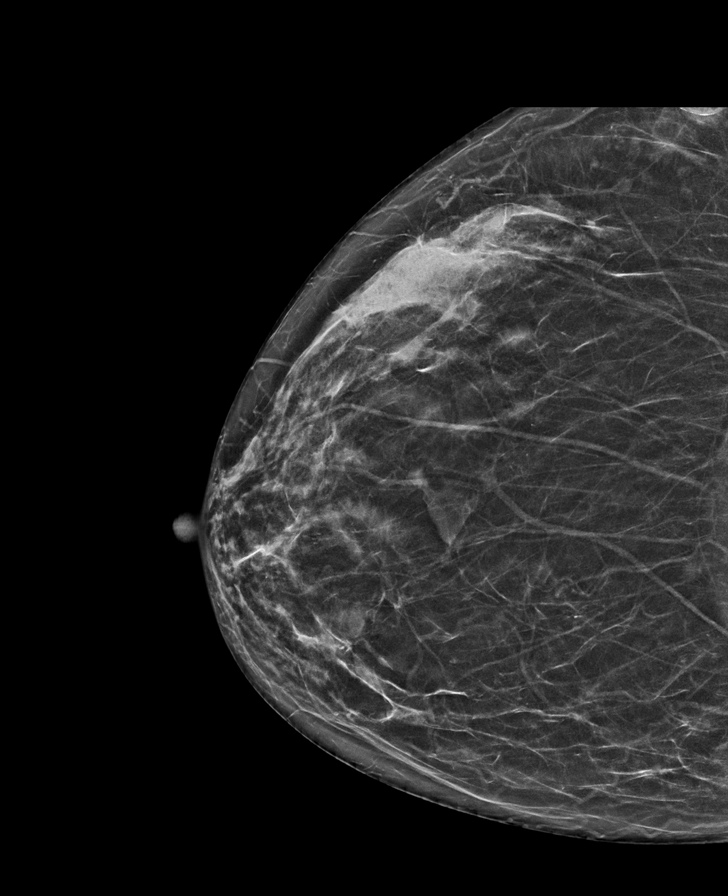

[R CC]
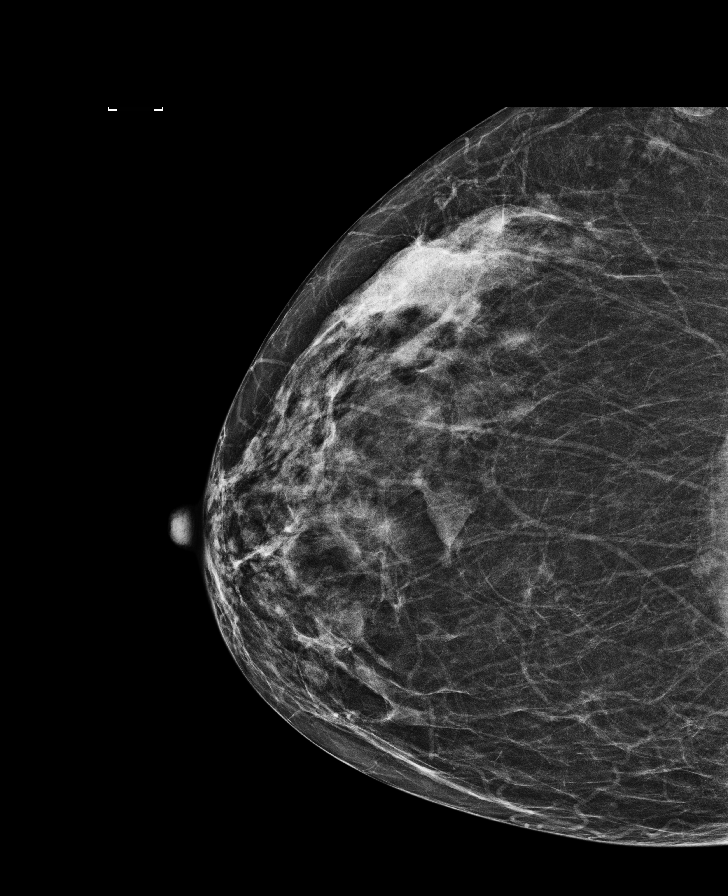

[8 of 28 positions shown; findings below may reference images not displayed]

ACR Breast Density Category b: There are scattered areas of
fibroglandular density.
FINDINGS: There are no findings suspicious for malignancy. Images were
processed with CAD.
IMPRESSION: No mammographic evidence of malignancy. A result letter of this
screening mammogram will be mailed directly to the patient.

RECOMMENDATION:
Screening mammogram in one year. (Code:97-6-RS4)

BI-RADS CATEGORY  1: Negative.
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
## 2018-04-15 IMAGING — CR DG FOOT COMPLETE 3+V*R*
3 series · 3 of 3 positions shown · non-contrast
Comparison: RIGHT ankle radiograph June 17, 2015

CLINICAL DATA: Fell down steps.  RIGHT foot numbness.

EXAM:
RIGHT FOOT COMPLETE - 3+ VIEW; RIGHT ANKLE - COMPLETE 3+ VIEW

[t foot ap right]
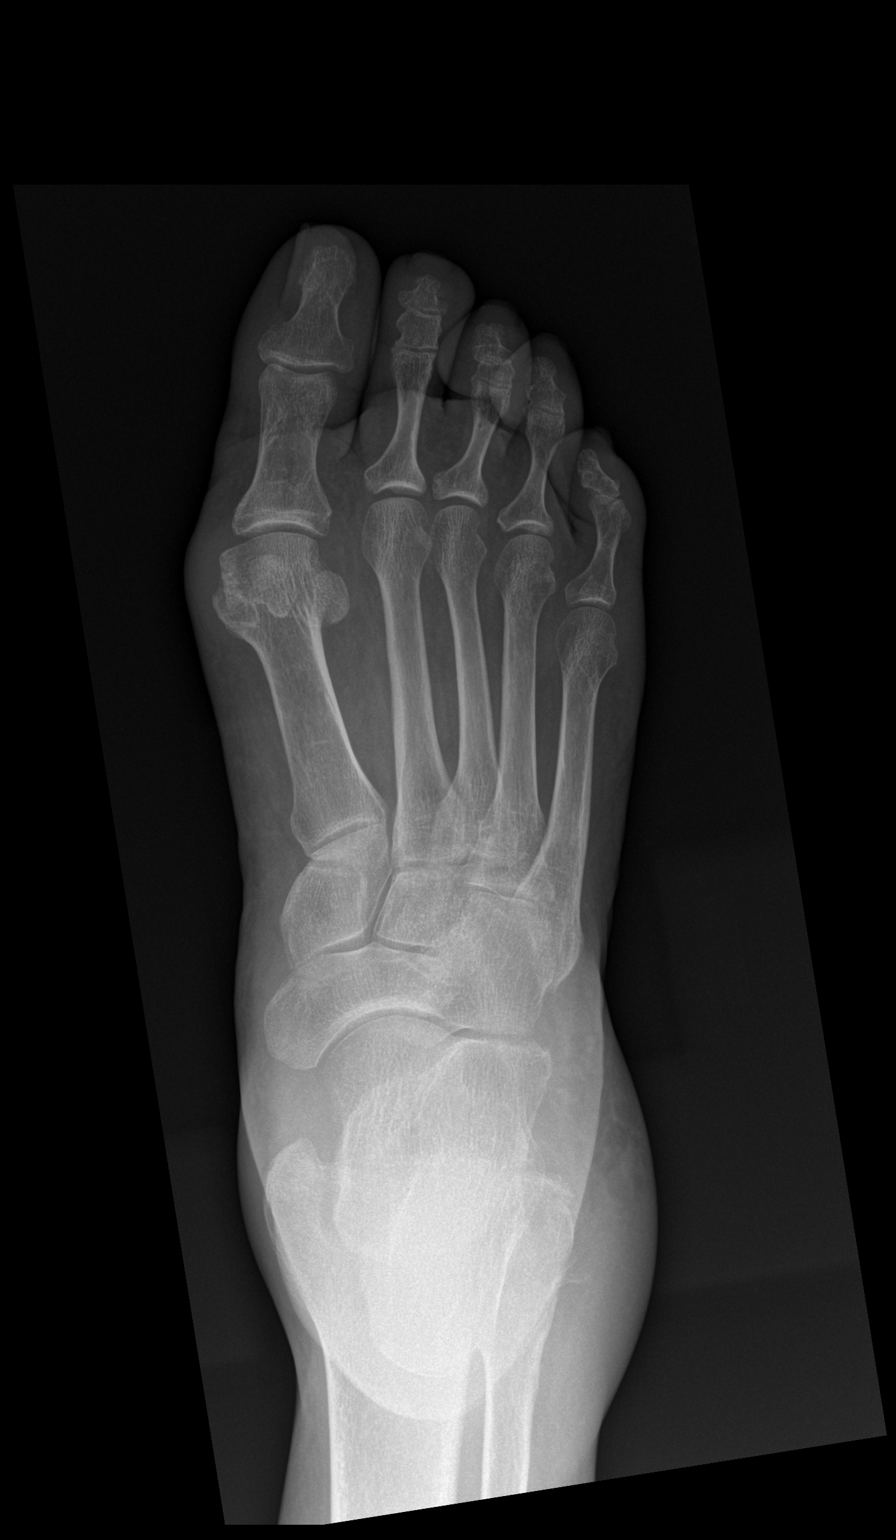

[t foot lat right]
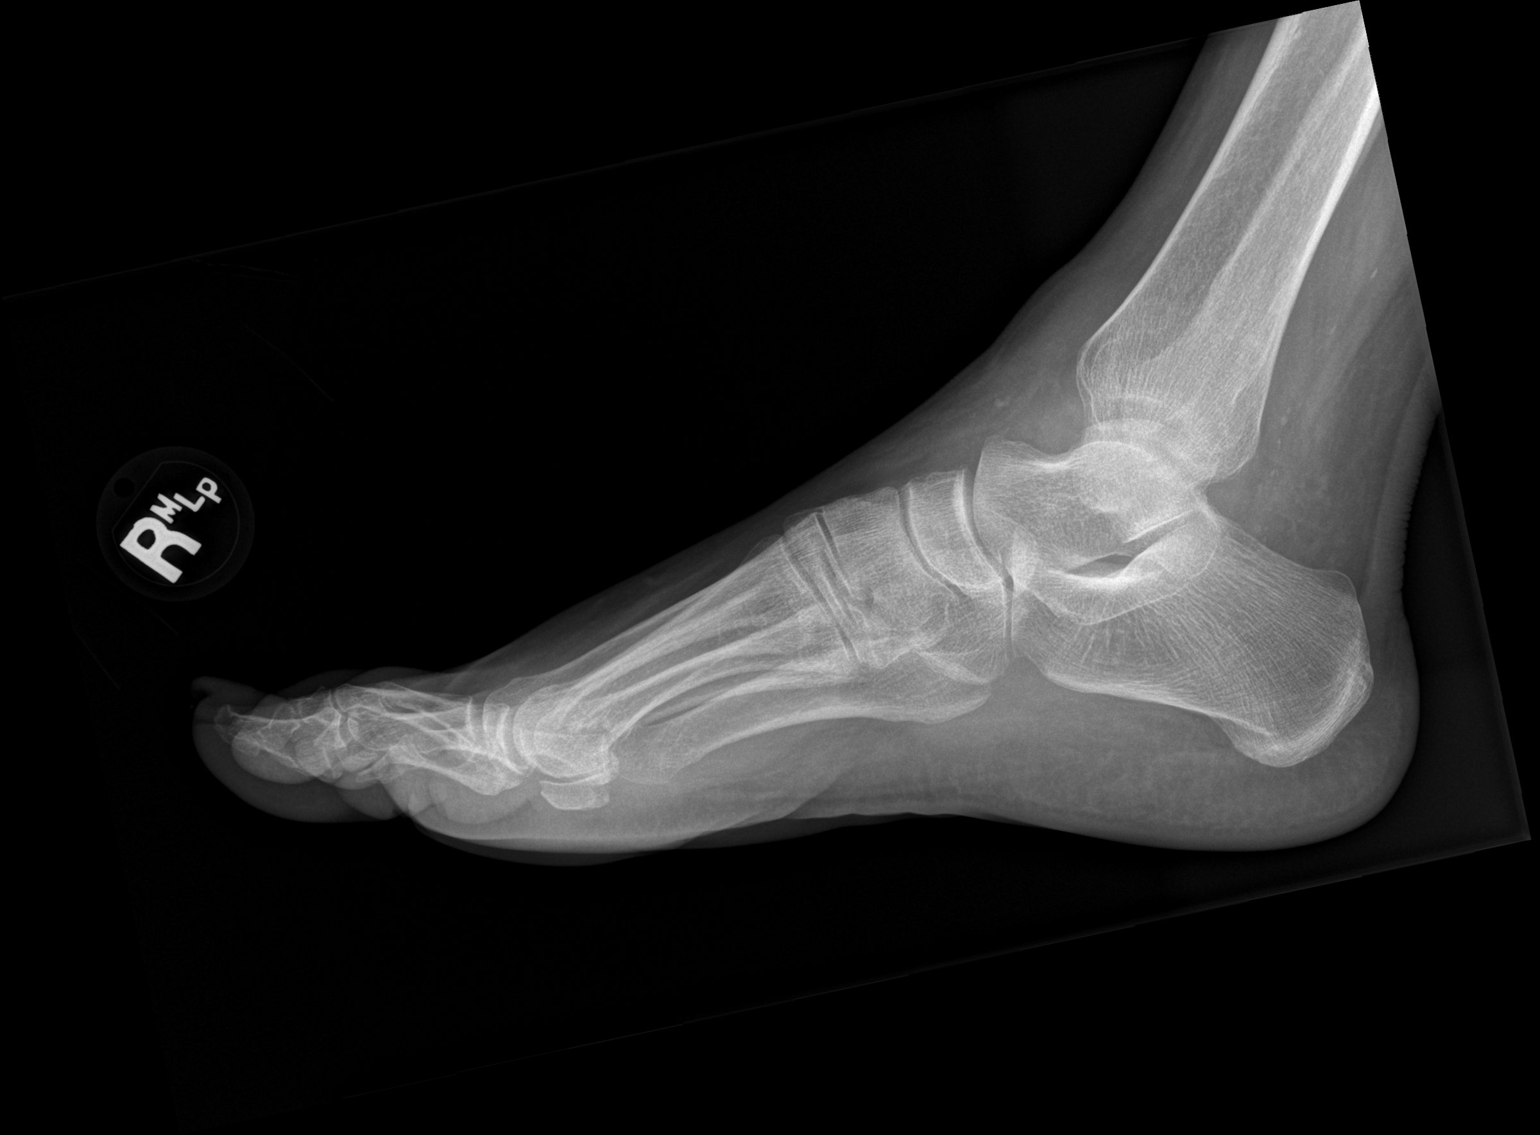

[t foot obl right]
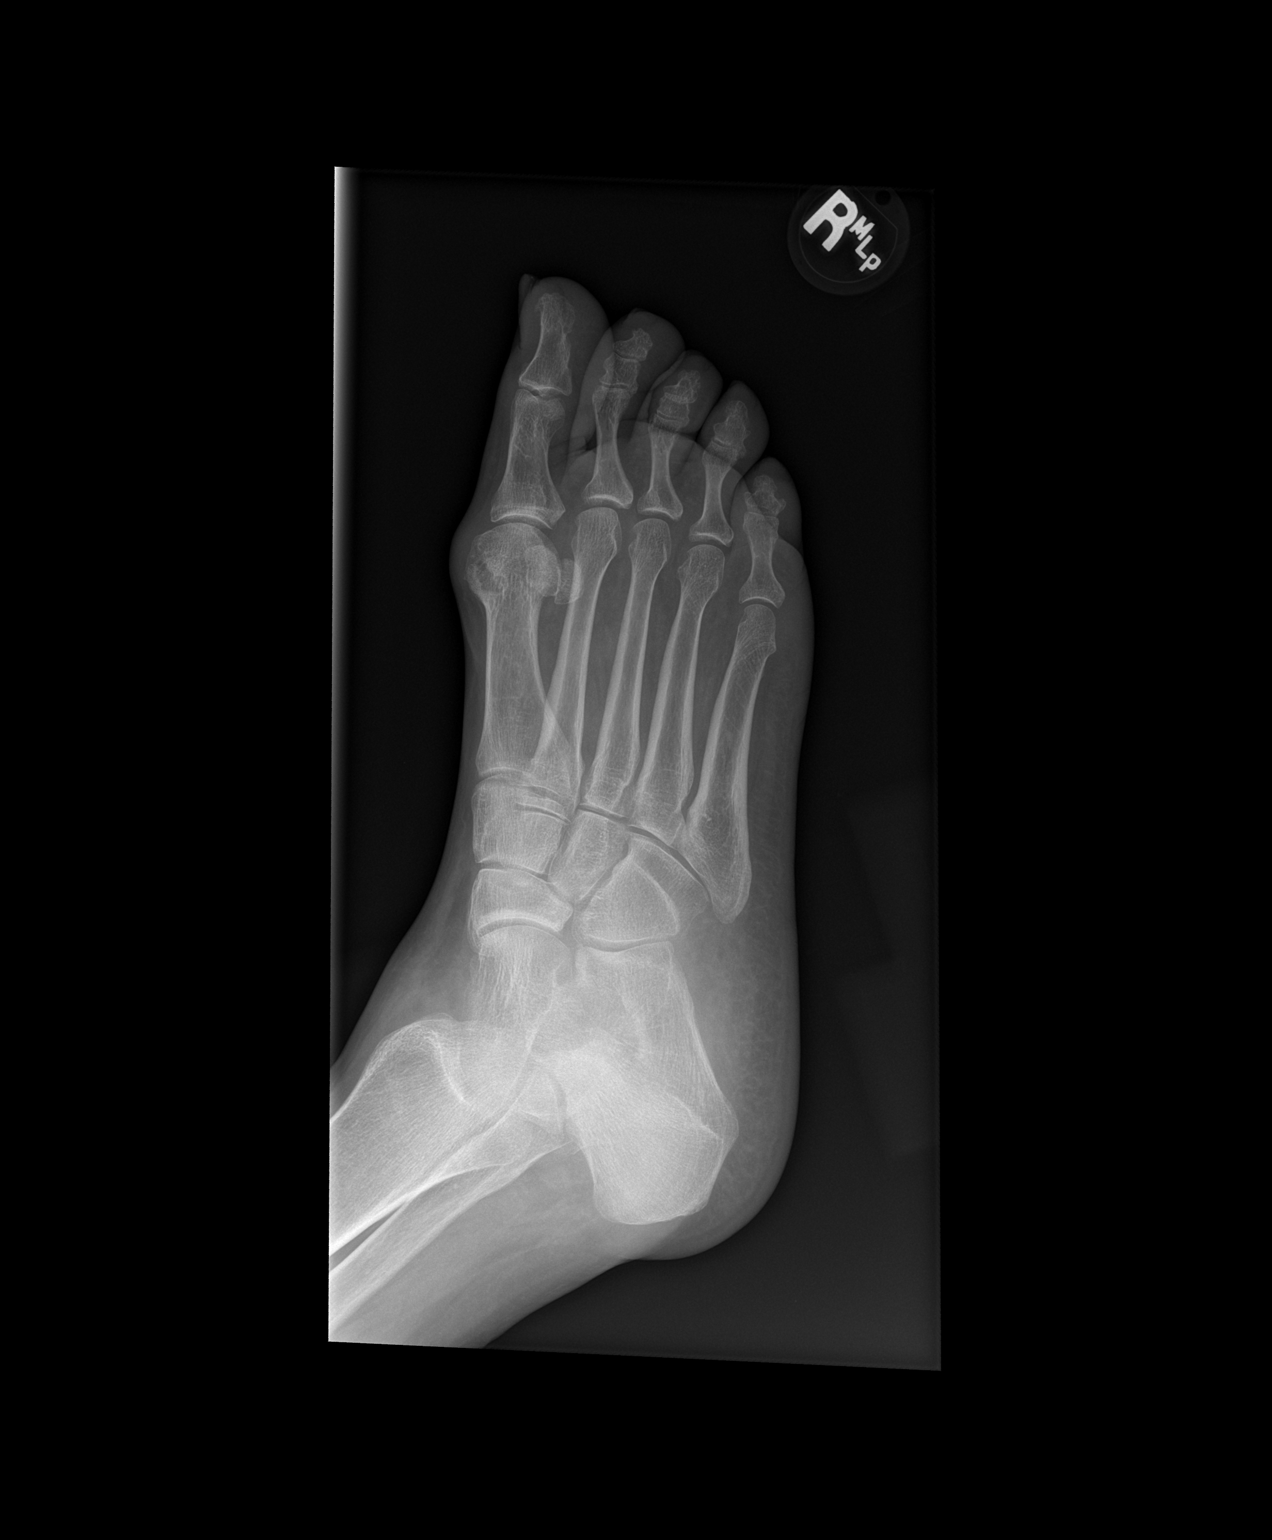

[3 of 3 positions shown; findings below may reference images not displayed]

FINDINGS: RIGHT foot: There is no evidence of fracture or dislocation. Mild
hallux valgus. There is no evidence of arthropathy or other focal
bone abnormality. Soft tissues are unremarkable.

RIGHT ankle: Acute lateral malleolus fracture with lateral
displacement distal bony fragments. The ankle mortise appears
congruent and the tibiofibular syndesmosis intact. No destructive
bony lesions. Lateral ankle soft tissue swelling without
subcutaneous gas or radiopaque foreign bodies. Faint vascular
calcifications.
IMPRESSION: Acute displaced lateral malleolus fracture.  No dislocation.

## 2018-04-15 IMAGING — CR DG KNEE COMPLETE 4+V*R*
4 series · 4 of 4 positions shown · non-contrast
Comparison: 02/17/2006

CLINICAL DATA: Pain after fall today.

EXAM:
RIGHT KNEE - COMPLETE 4+ VIEW

[t knee ap right]
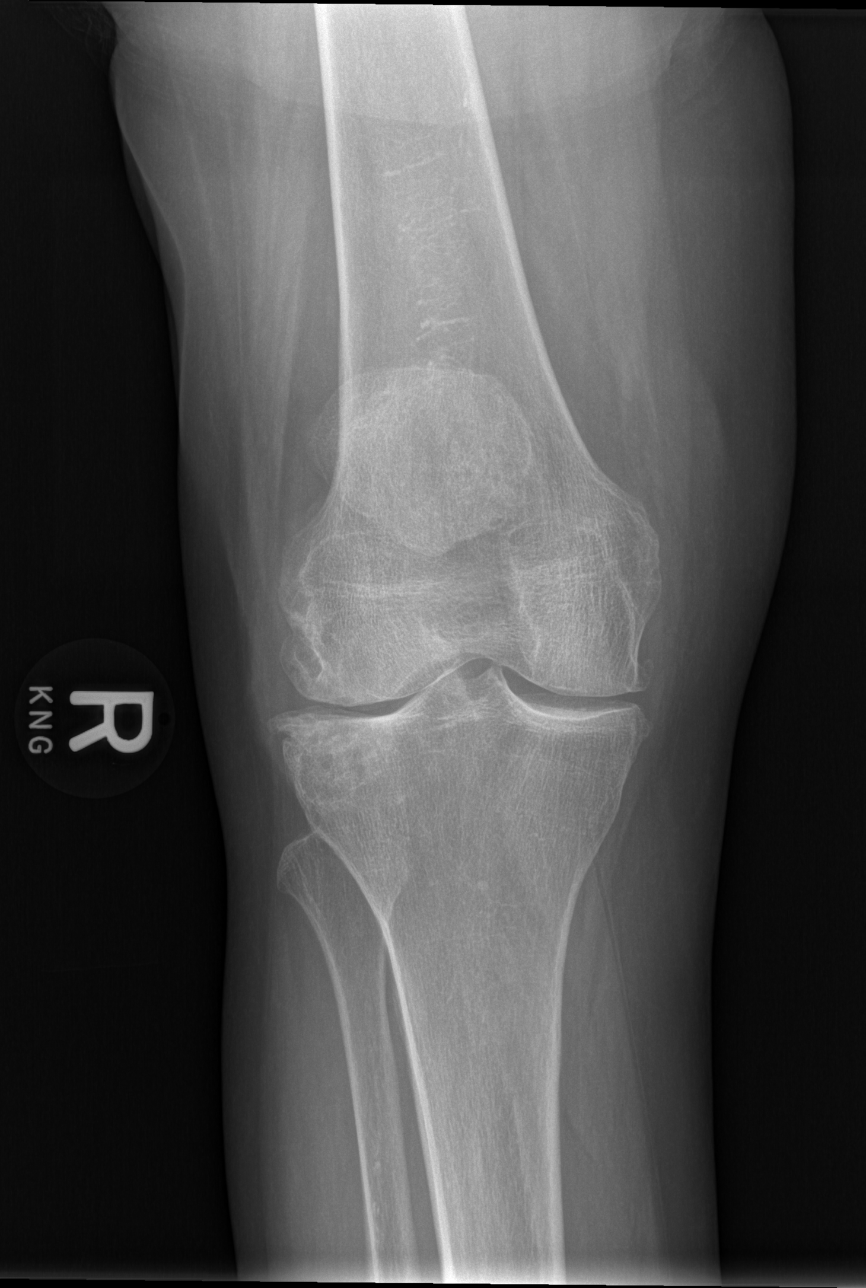

[t knee obl right (1 of 2)]
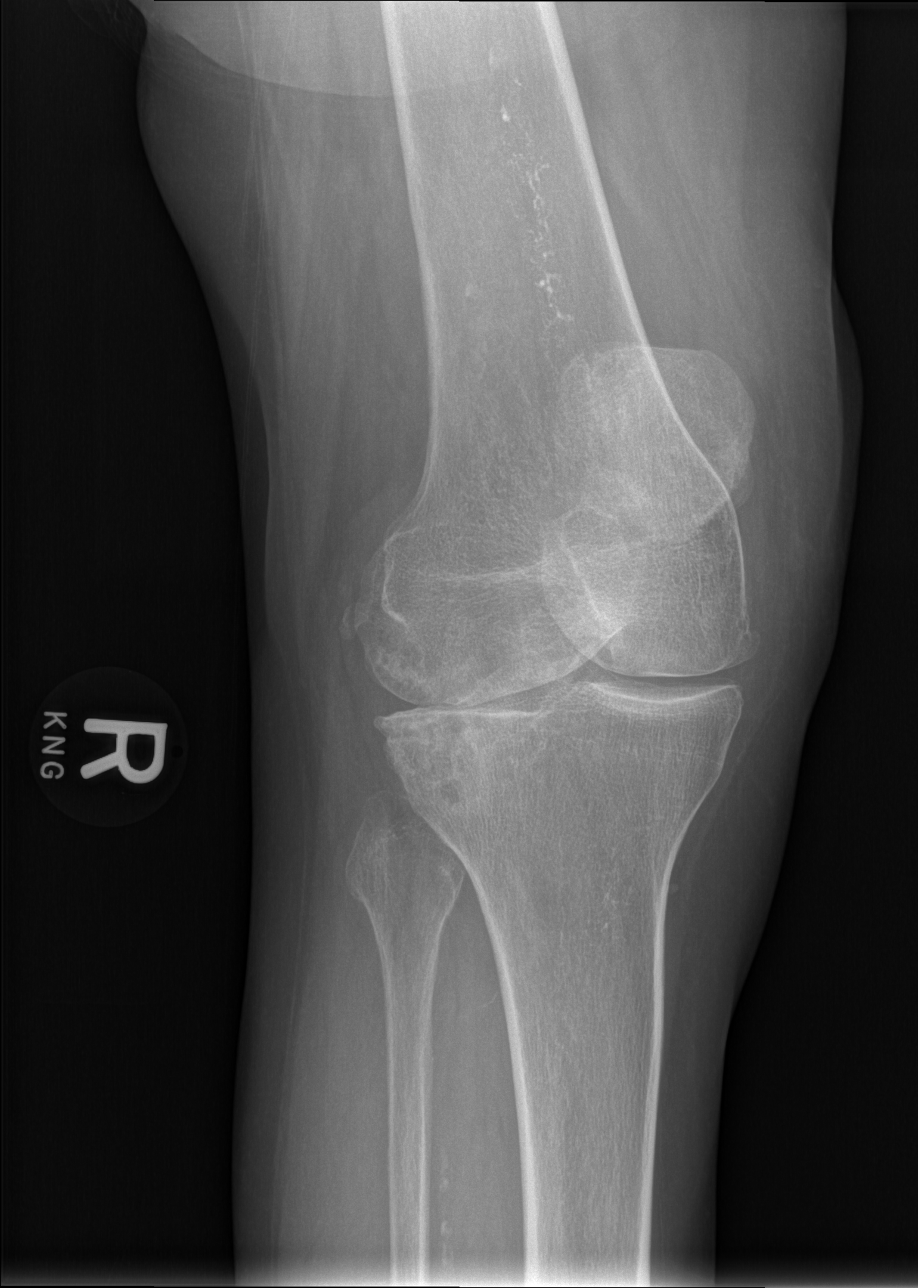

[t knee obl right (2 of 2)]
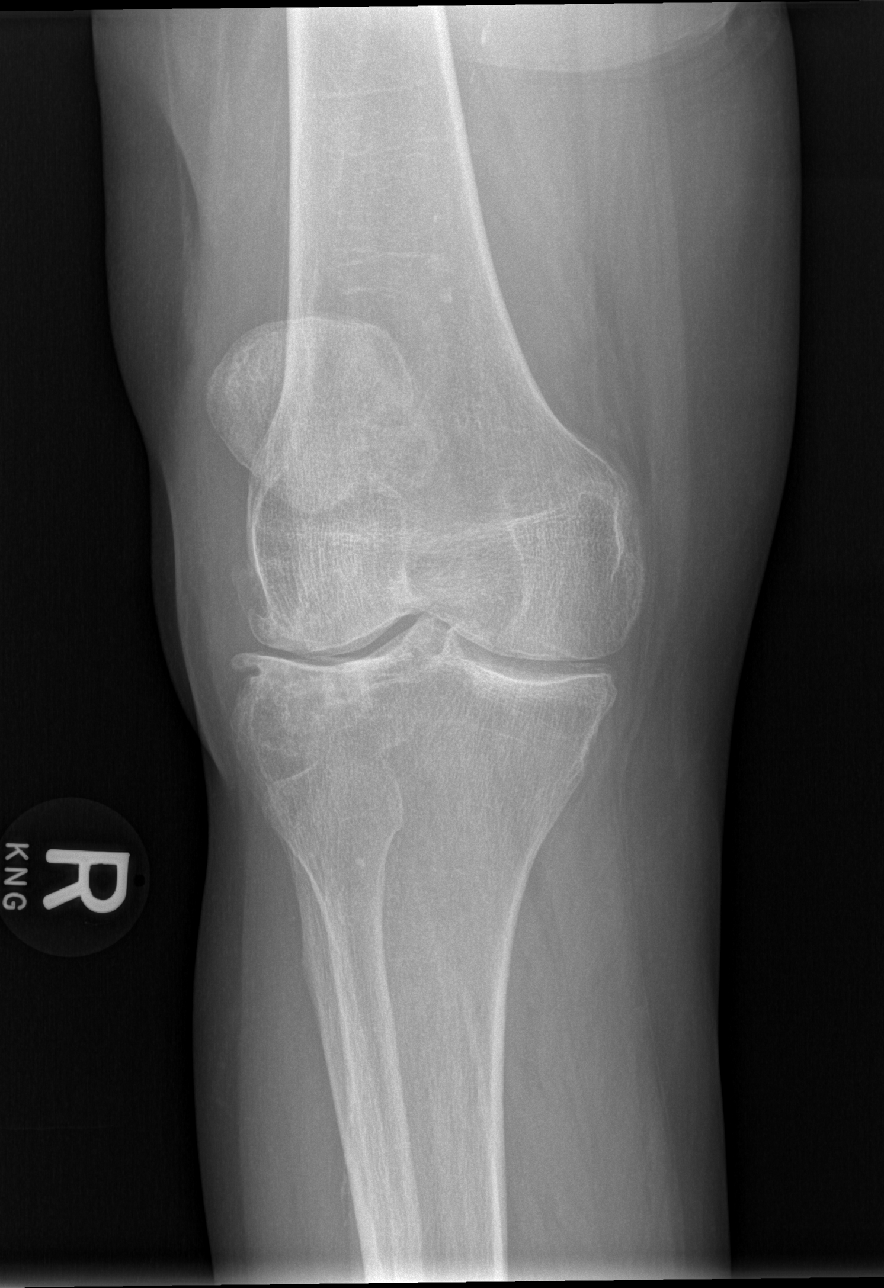

[t knee lat right]
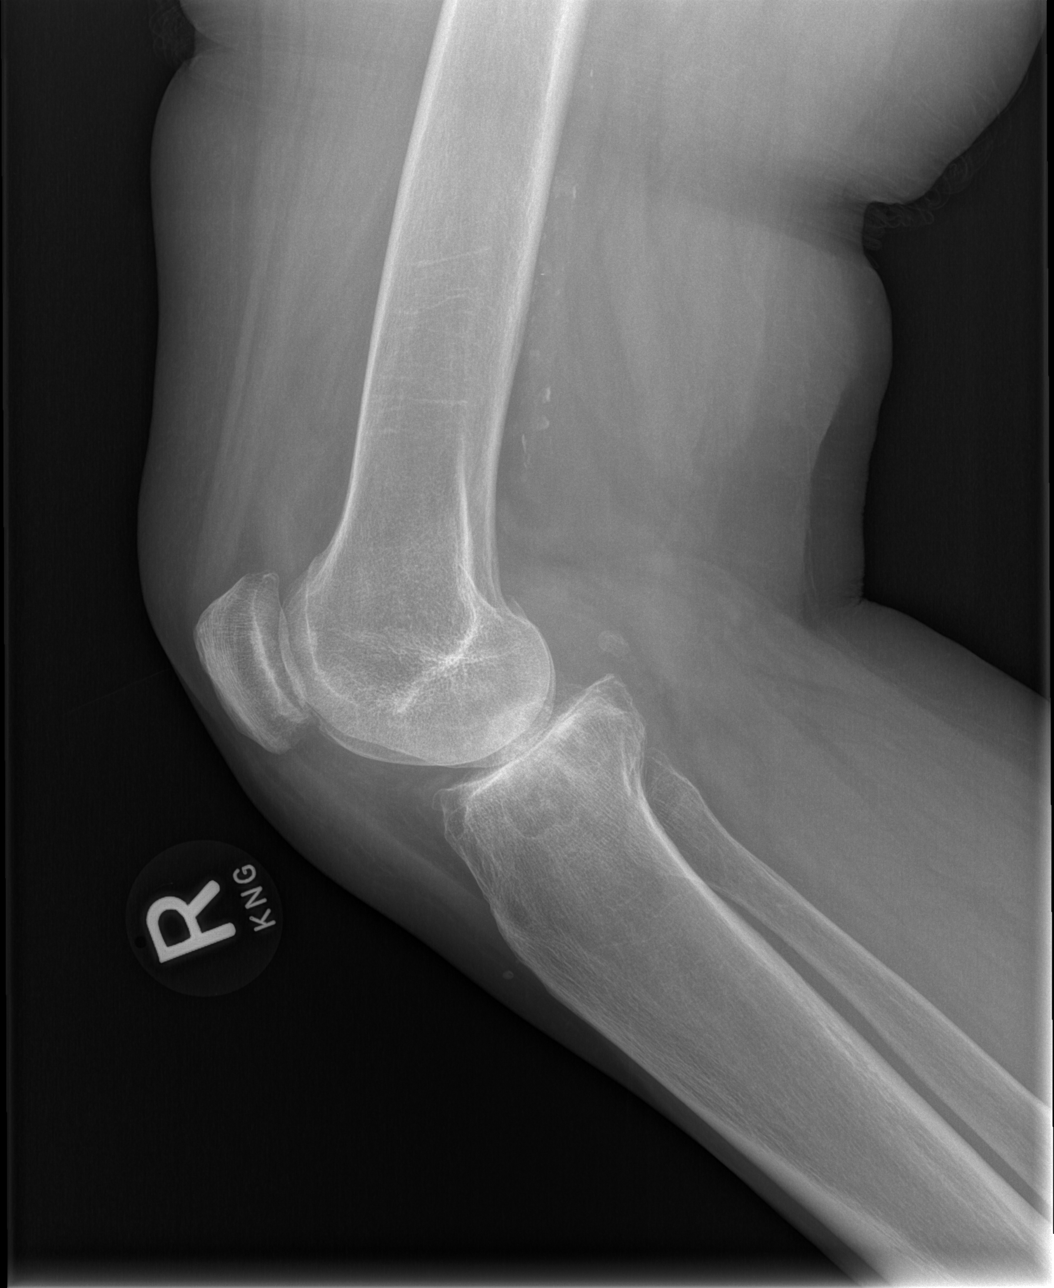

[4 of 4 positions shown; findings below may reference images not displayed]

FINDINGS: Interval development of degenerative subchondral sclerosis and
cystic change of the lateral femoral condyle and lateral tibial
plateau. No acute displaced appearing fracture nor joint effusion.
Bone-on-bone apposition is noted of the lateral femorotibial
compartment is currently identified. There is distal femoral
arteriosclerosis.
IMPRESSION: Bone-on-bone apposition of the lateral femoral condyle with lateral
tibial plateau consistent with osteoarthritis with degenerative
subchondral cystic change and sclerosis. No acute fracture or joint
effusion

## 2018-04-15 IMAGING — CR DG ANKLE COMPLETE 3+V*R*
3 series · 3 of 3 positions shown · non-contrast
Comparison: RIGHT ankle radiograph June 17, 2015

CLINICAL DATA: Fell down steps.  RIGHT foot numbness.

EXAM:
RIGHT FOOT COMPLETE - 3+ VIEW; RIGHT ANKLE - COMPLETE 3+ VIEW

[t ankle lat right]
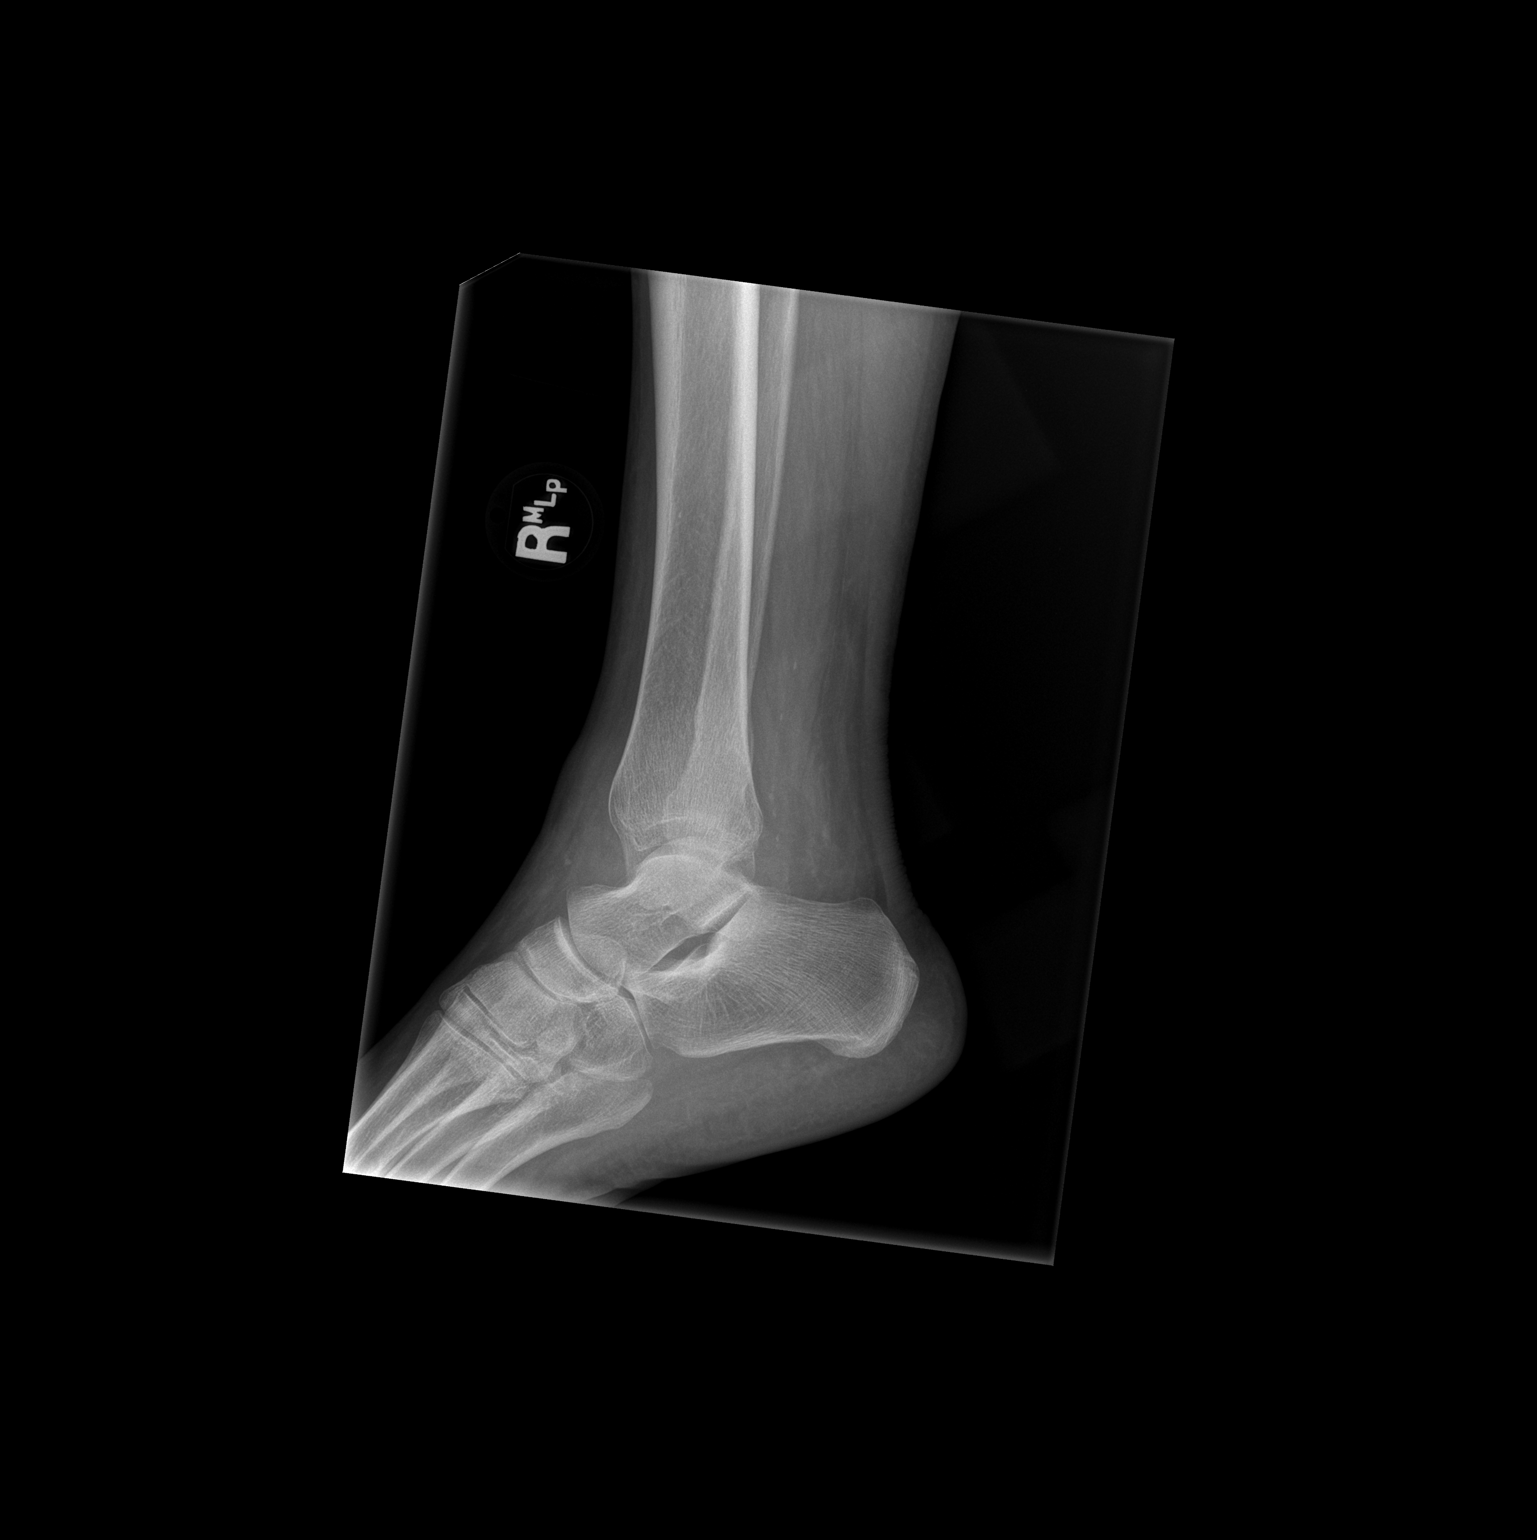

[t ankle ap right]
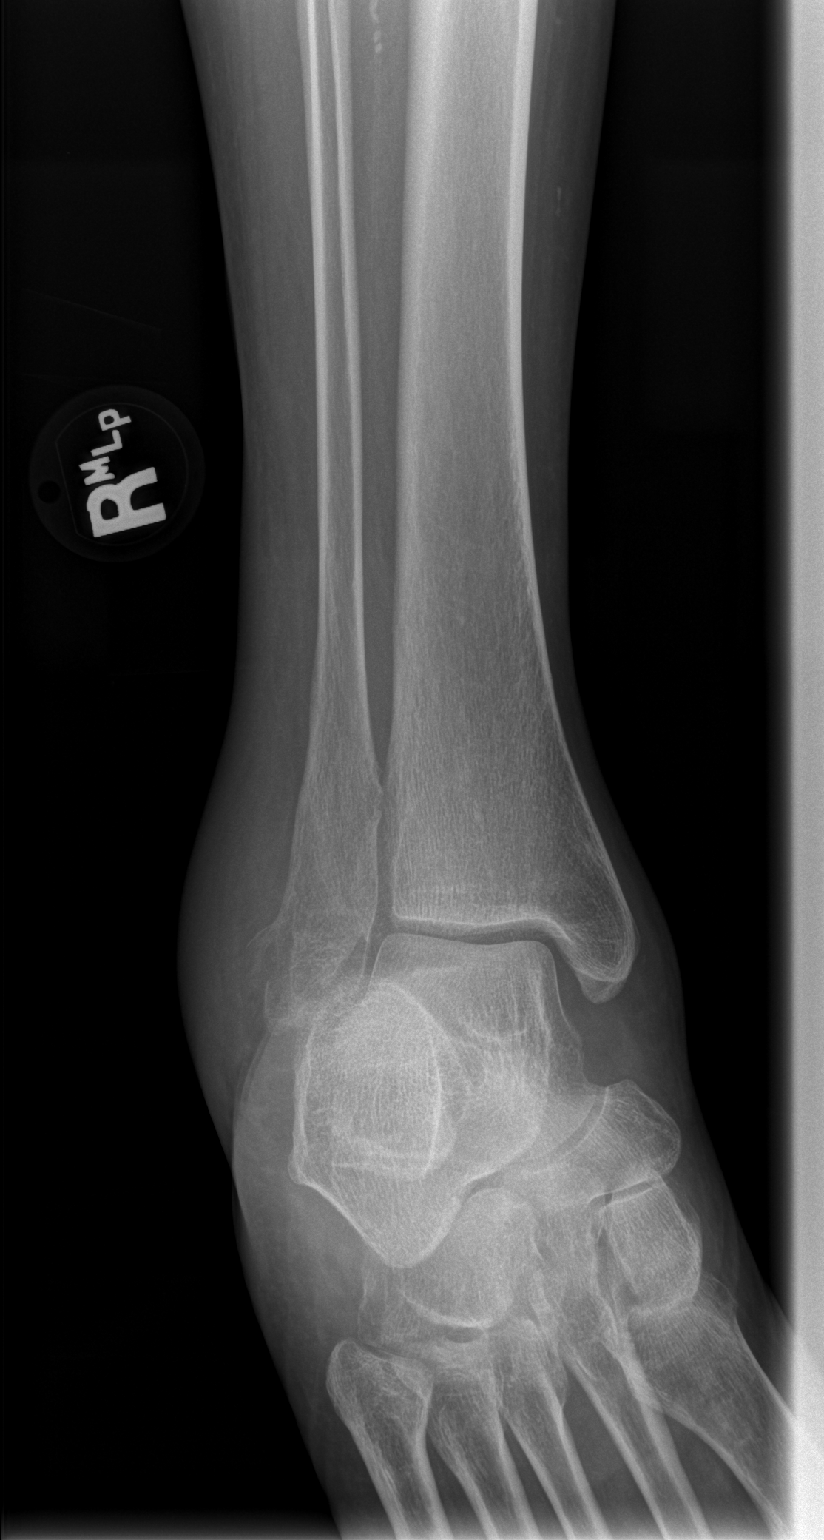

[t ankle obl right]
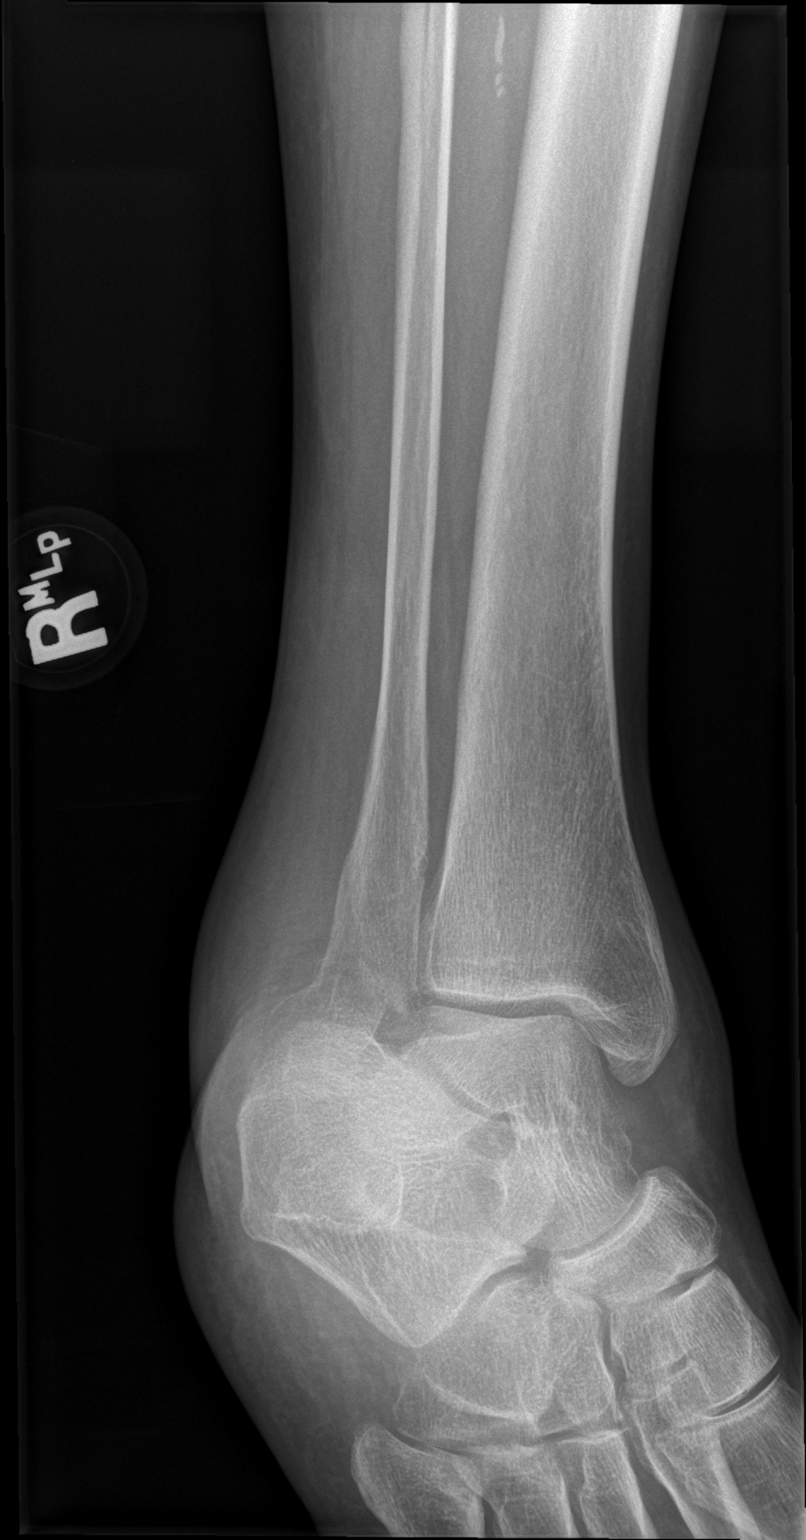

[3 of 3 positions shown; findings below may reference images not displayed]

FINDINGS: RIGHT foot: There is no evidence of fracture or dislocation. Mild
hallux valgus. There is no evidence of arthropathy or other focal
bone abnormality. Soft tissues are unremarkable.

RIGHT ankle: Acute lateral malleolus fracture with lateral
displacement distal bony fragments. The ankle mortise appears
congruent and the tibiofibular syndesmosis intact. No destructive
bony lesions. Lateral ankle soft tissue swelling without
subcutaneous gas or radiopaque foreign bodies. Faint vascular
calcifications.
IMPRESSION: Acute displaced lateral malleolus fracture.  No dislocation.

## 2018-04-15 IMAGING — CR DG FOOT COMPLETE 3+V*L*
3 series · 3 of 3 positions shown · non-contrast
Comparison: None.

CLINICAL DATA: Fell down steps.  Severe great toe pain.

EXAM:
LEFT FOOT - COMPLETE 3+ VIEW

[t foot ap left]
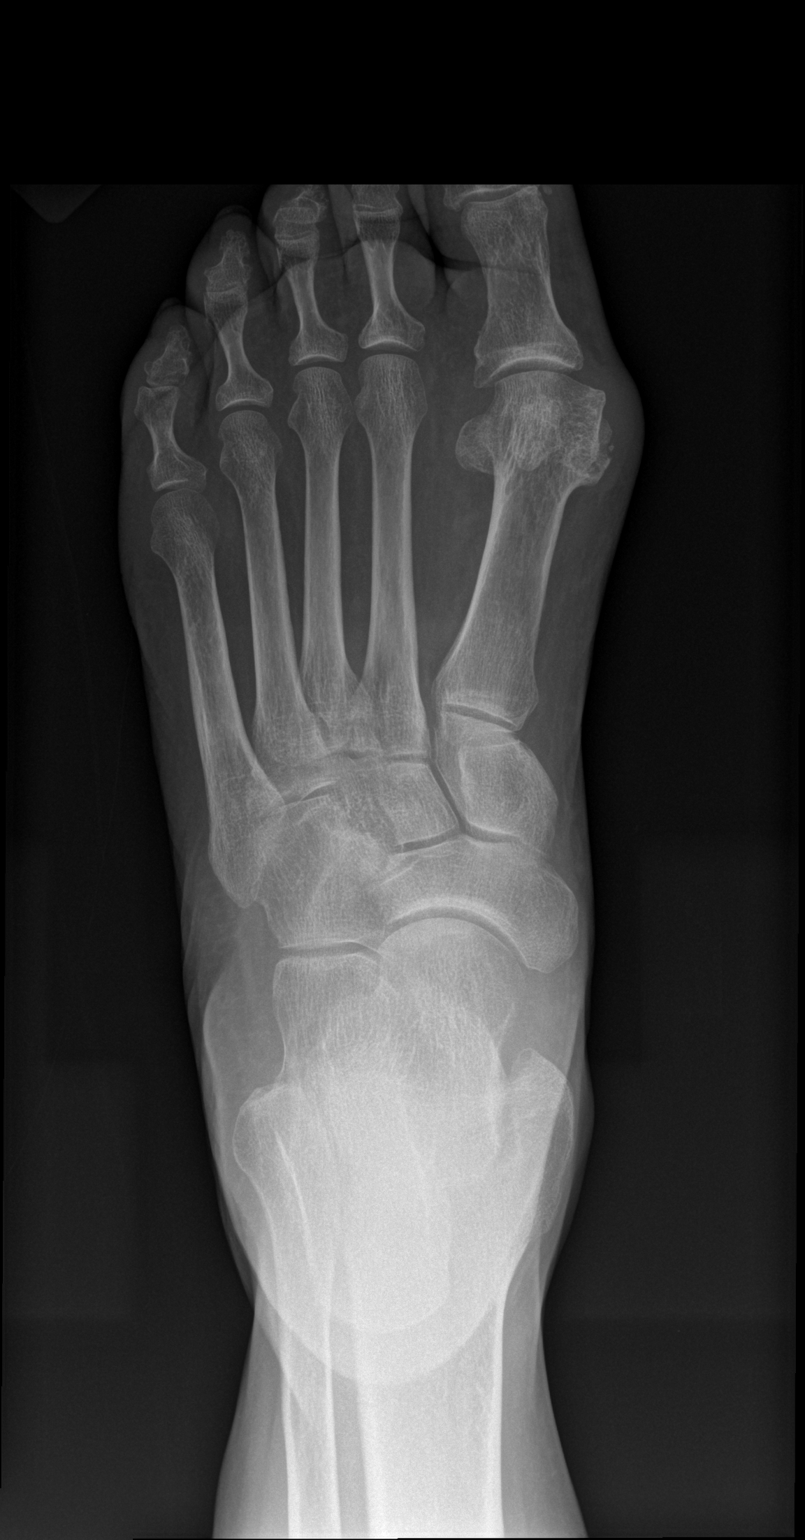

[t foot obl left]
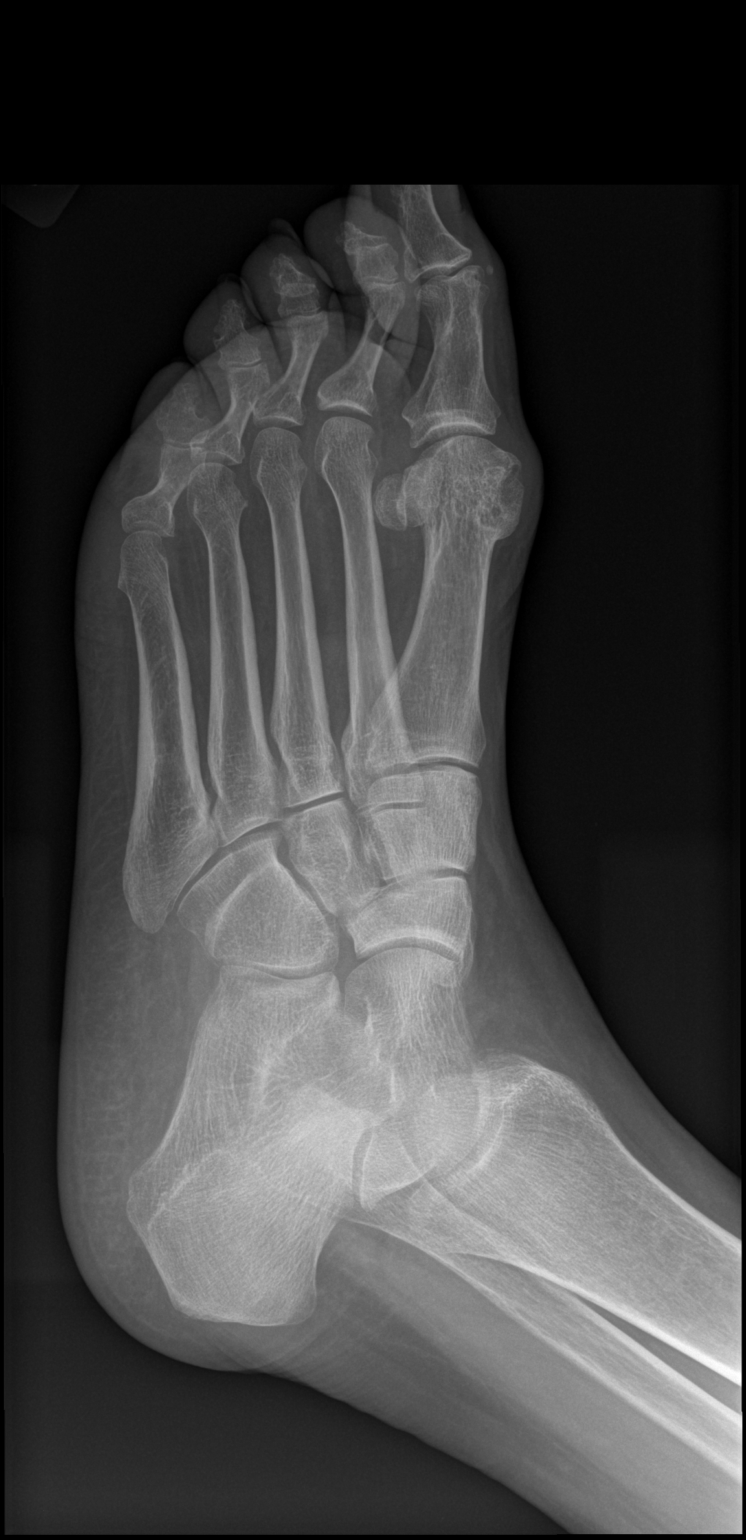

[t foot lat left]
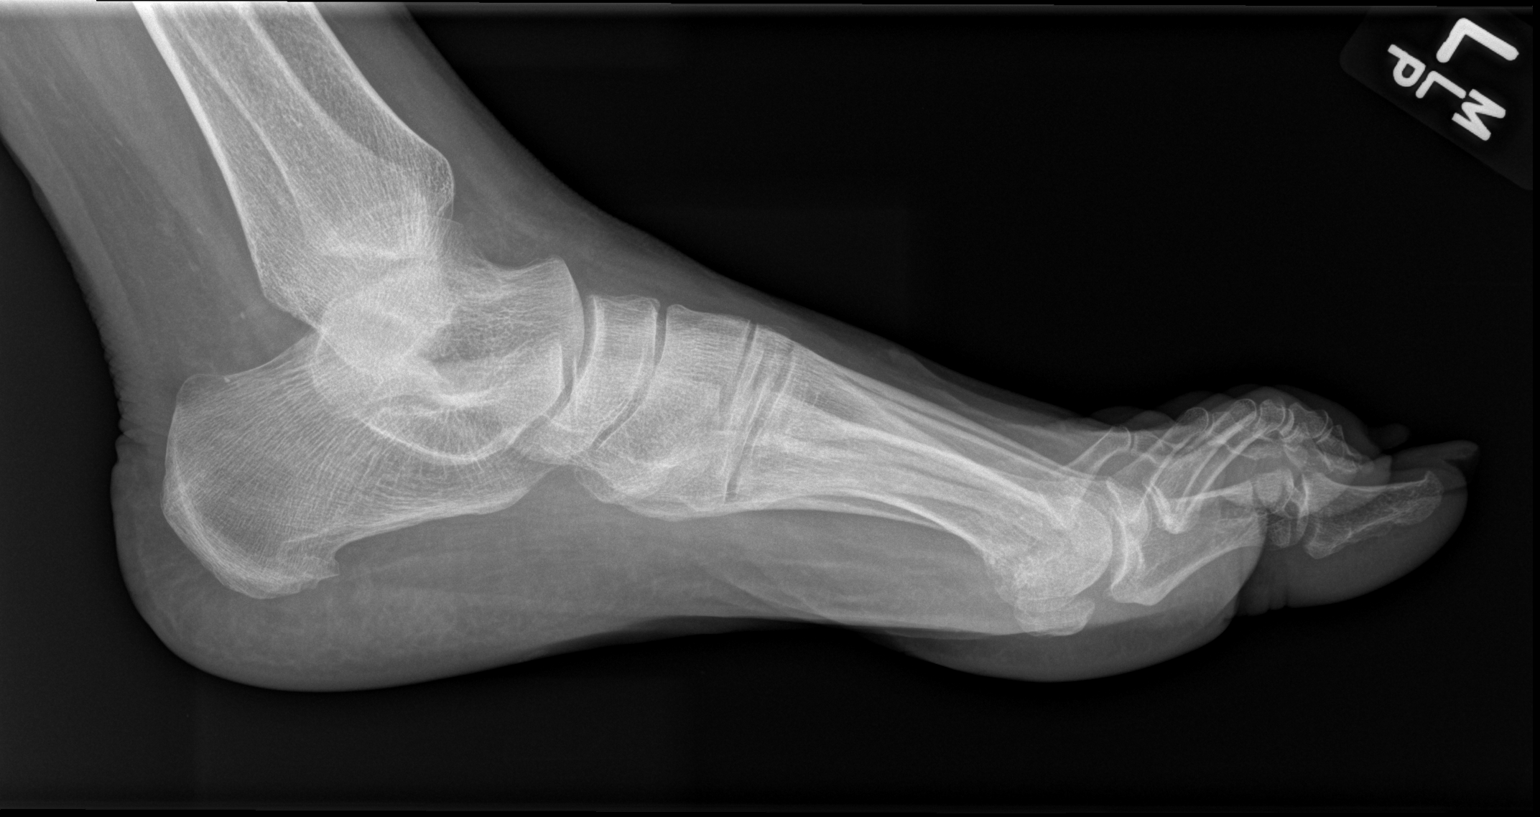

[3 of 3 positions shown; findings below may reference images not displayed]

FINDINGS: No acute fracture deformity or dislocation. Small plantar calcaneal
spur. Mild hallux valgus and mild degenerative change of the first
metatarsal phalangeal joint. No destructive bony lesions. Flexed
toes seen with hammertoe deformity. Faint vascular calcifications.
Soft tissue planes are nonsuspicious.
IMPRESSION: No acute fracture deformity or dislocation..

## 2018-06-23 ENCOUNTER — Emergency Department (HOSPITAL_COMMUNITY): Payer: Medicaid Other

## 2018-06-23 ENCOUNTER — Other Ambulatory Visit: Payer: Self-pay

## 2018-06-23 ENCOUNTER — Inpatient Hospital Stay (HOSPITAL_COMMUNITY)
Admission: EM | Admit: 2018-06-23 | Discharge: 2018-06-29 | DRG: 918 | Disposition: A | Discharge status: Home / Self Care | Payer: Medicaid Other | Attending: Internal Medicine | Admitting: Internal Medicine

## 2018-06-23 DIAGNOSIS — F1910 Other psychoactive substance abuse, uncomplicated: Secondary | ICD-10-CM

## 2018-06-23 DIAGNOSIS — Z79899 Other long term (current) drug therapy: Secondary | ICD-10-CM

## 2018-06-23 DIAGNOSIS — K5900 Constipation, unspecified: Secondary | ICD-10-CM | POA: Diagnosis present

## 2018-06-23 DIAGNOSIS — F17210 Nicotine dependence, cigarettes, uncomplicated: Secondary | ICD-10-CM | POA: Diagnosis present

## 2018-06-23 DIAGNOSIS — Z20828 Contact with and (suspected) exposure to other viral communicable diseases: Secondary | ICD-10-CM | POA: Diagnosis present

## 2018-06-23 DIAGNOSIS — N183 Chronic kidney disease, stage 3 (moderate): Secondary | ICD-10-CM | POA: Diagnosis present

## 2018-06-23 DIAGNOSIS — J449 Chronic obstructive pulmonary disease, unspecified: Secondary | ICD-10-CM | POA: Diagnosis present

## 2018-06-23 DIAGNOSIS — G894 Chronic pain syndrome: Secondary | ICD-10-CM | POA: Diagnosis present

## 2018-06-23 DIAGNOSIS — Z885 Allergy status to narcotic agent status: Secondary | ICD-10-CM | POA: Diagnosis not present

## 2018-06-23 DIAGNOSIS — E785 Hyperlipidemia, unspecified: Secondary | ICD-10-CM | POA: Diagnosis present

## 2018-06-23 DIAGNOSIS — K521 Toxic gastroenteritis and colitis: Secondary | ICD-10-CM | POA: Diagnosis present

## 2018-06-23 DIAGNOSIS — M069 Rheumatoid arthritis, unspecified: Secondary | ICD-10-CM | POA: Diagnosis present

## 2018-06-23 DIAGNOSIS — K529 Noninfective gastroenteritis and colitis, unspecified: Secondary | ICD-10-CM

## 2018-06-23 DIAGNOSIS — Z87442 Personal history of urinary calculi: Secondary | ICD-10-CM | POA: Diagnosis not present

## 2018-06-23 DIAGNOSIS — I129 Hypertensive chronic kidney disease with stage 1 through stage 4 chronic kidney disease, or unspecified chronic kidney disease: Secondary | ICD-10-CM | POA: Diagnosis present

## 2018-06-23 DIAGNOSIS — I10 Essential (primary) hypertension: Secondary | ICD-10-CM | POA: Diagnosis present

## 2018-06-23 DIAGNOSIS — Z90710 Acquired absence of both cervix and uterus: Secondary | ICD-10-CM | POA: Diagnosis not present

## 2018-06-23 DIAGNOSIS — Z888 Allergy status to other drugs, medicaments and biological substances status: Secondary | ICD-10-CM | POA: Diagnosis not present

## 2018-06-23 DIAGNOSIS — K559 Vascular disorder of intestine, unspecified: Secondary | ICD-10-CM | POA: Diagnosis present

## 2018-06-23 DIAGNOSIS — Z825 Family history of asthma and other chronic lower respiratory diseases: Secondary | ICD-10-CM

## 2018-06-23 DIAGNOSIS — R109 Unspecified abdominal pain: Secondary | ICD-10-CM | POA: Diagnosis not present

## 2018-06-23 DIAGNOSIS — F1410 Cocaine abuse, uncomplicated: Secondary | ICD-10-CM | POA: Diagnosis present

## 2018-06-23 DIAGNOSIS — Z8249 Family history of ischemic heart disease and other diseases of the circulatory system: Secondary | ICD-10-CM

## 2018-06-23 DIAGNOSIS — F411 Generalized anxiety disorder: Secondary | ICD-10-CM | POA: Diagnosis present

## 2018-06-23 DIAGNOSIS — K219 Gastro-esophageal reflux disease without esophagitis: Secondary | ICD-10-CM | POA: Diagnosis present

## 2018-06-23 DIAGNOSIS — Z7952 Long term (current) use of systemic steroids: Secondary | ICD-10-CM

## 2018-06-23 DIAGNOSIS — R0600 Dyspnea, unspecified: Secondary | ICD-10-CM

## 2018-06-23 DIAGNOSIS — R0789 Other chest pain: Secondary | ICD-10-CM | POA: Diagnosis not present

## 2018-06-23 DIAGNOSIS — F1110 Opioid abuse, uncomplicated: Secondary | ICD-10-CM | POA: Diagnosis present

## 2018-06-23 DIAGNOSIS — R079 Chest pain, unspecified: Secondary | ICD-10-CM

## 2018-06-23 DIAGNOSIS — Z79891 Long term (current) use of opiate analgesic: Secondary | ICD-10-CM

## 2018-06-23 DIAGNOSIS — K921 Melena: Secondary | ICD-10-CM | POA: Diagnosis present

## 2018-06-23 DIAGNOSIS — Z880 Allergy status to penicillin: Secondary | ICD-10-CM | POA: Diagnosis not present

## 2018-06-23 DIAGNOSIS — K625 Hemorrhage of anus and rectum: Secondary | ICD-10-CM

## 2018-06-23 DIAGNOSIS — T405X4A Poisoning by cocaine, undetermined, initial encounter: Secondary | ICD-10-CM | POA: Diagnosis present

## 2018-06-23 DIAGNOSIS — Z981 Arthrodesis status: Secondary | ICD-10-CM

## 2018-06-23 DIAGNOSIS — Z881 Allergy status to other antibiotic agents status: Secondary | ICD-10-CM | POA: Diagnosis not present

## 2018-06-23 DIAGNOSIS — Z720 Tobacco use: Secondary | ICD-10-CM | POA: Diagnosis present

## 2018-06-23 LAB — C DIFFICILE QUICK SCREEN W PCR REFLEX
C Diff antigen: NEGATIVE — AB
C Diff toxin: NEGATIVE — AB

## 2018-06-23 LAB — COMPREHENSIVE METABOLIC PANEL
ALT: 10 U/L (ref 0–44)
AST: 13 U/L — ABNORMAL LOW (ref 15–41)
Albumin: 3.3 g/dL — ABNORMAL LOW (ref 3.5–5.0)
Alkaline Phosphatase: 101 U/L (ref 38–126)
Anion gap: 11 (ref 5–15)
BUN: 21 mg/dL (ref 8–23)
CO2: 17 mmol/L — ABNORMAL LOW (ref 22–32)
Calcium: 8.4 mg/dL — ABNORMAL LOW (ref 8.9–10.3)
Chloride: 111 mmol/L (ref 98–111)
Creatinine, Ser: 1.17 mg/dL — ABNORMAL HIGH (ref 0.44–1.00)
GFR calc Af Amer: 57 mL/min — ABNORMAL LOW (ref >60)
GFR calc non Af Amer: 49 mL/min — ABNORMAL LOW (ref >60)
Glucose, Bld: 107 mg/dL — ABNORMAL HIGH (ref 70–99)
Potassium: 3.5 mmol/L (ref 3.5–5.1)
Sodium: 139 mmol/L (ref 135–145)
Total Bilirubin: 0.5 mg/dL (ref 0.3–1.2)
Total Protein: 6.9 g/dL (ref 6.5–8.1)

## 2018-06-23 LAB — DIFFERENTIAL
Basophils Absolute: 0 10*3/uL (ref 0.0–0.1)
Basophils Relative: 0 %
Eosinophils Absolute: 0.2 10*3/uL (ref 0.0–0.5)
Eosinophils Relative: 2 %
Lymphocytes Relative: 18 %
Lymphs Abs: 2 10*3/uL (ref 0.7–4.0)
Monocytes Absolute: 0.5 10*3/uL (ref 0.1–1.0)
Monocytes Relative: 5 %
Neutro Abs: 7.9 10*3/uL — ABNORMAL HIGH (ref 1.7–7.7)
Neutrophils Relative %: 74 %

## 2018-06-23 LAB — PROTIME-INR
INR: 0.9 (ref 0.8–1.2)
Prothrombin Time: 12.2 seconds (ref 11.4–15.2)

## 2018-06-23 LAB — URINALYSIS, ROUTINE W REFLEX MICROSCOPIC
Bilirubin Urine: NEGATIVE
Glucose, UA: NEGATIVE mg/dL
Ketones, ur: NEGATIVE mg/dL
Nitrite: NEGATIVE
Protein, ur: NEGATIVE mg/dL
Specific Gravity, Urine: 1.011 (ref 1.005–1.030)
pH: 5 (ref 5.0–8.0)

## 2018-06-23 LAB — RAPID URINE DRUG SCREEN, HOSP PERFORMED
Amphetamines: NOT DETECTED
Barbiturates: NOT DETECTED
Benzodiazepines: NOT DETECTED
Cocaine: POSITIVE — AB
Opiates: POSITIVE — AB
Tetrahydrocannabinol: NOT DETECTED

## 2018-06-23 LAB — POC OCCULT BLOOD, ED: Fecal Occult Bld: POSITIVE — AB

## 2018-06-23 LAB — CBC
HCT: 42.4 % (ref 36.0–46.0)
Hemoglobin: 13.7 g/dL (ref 12.0–15.0)
MCH: 29.7 pg (ref 26.0–34.0)
MCHC: 32.3 g/dL (ref 30.0–36.0)
MCV: 91.8 fL (ref 80.0–100.0)
Platelets: 261 10*3/uL (ref 150–400)
RBC: 4.62 MIL/uL (ref 3.87–5.11)
RDW: 14.9 % (ref 11.5–15.5)
WBC: 10.7 10*3/uL — ABNORMAL HIGH (ref 4.0–10.5)
nRBC: 0 % (ref 0.0–0.2)

## 2018-06-23 LAB — LACTIC ACID, PLASMA: Lactic Acid, Venous: 1.5 mmol/L (ref 0.5–1.9)

## 2018-06-23 LAB — SARS CORONAVIRUS 2 BY RT PCR (HOSPITAL ORDER, PERFORMED IN ~~LOC~~ HOSPITAL LAB): SARS Coronavirus 2: NEGATIVE

## 2018-06-23 LAB — LIPASE, BLOOD: Lipase: 22 U/L (ref 11–51)

## 2018-06-23 LAB — TYPE AND SCREEN
ABO/RH(D): B POS
Antibody Screen: NEGATIVE

## 2018-06-23 MED ORDER — SODIUM CHLORIDE 0.9 % IV BOLUS
1000.0000 mL | Freq: Once | INTRAVENOUS | Status: AC
  Administered 2018-06-23: 1000 mL via INTRAVENOUS

## 2018-06-23 MED ORDER — MORPHINE SULFATE (PF) 2 MG/ML IV SOLN
2.0000 mg | Freq: Once | INTRAVENOUS | Status: AC
  Administered 2018-06-23: 2 mg via INTRAVENOUS
  Filled 2018-06-23: qty 1

## 2018-06-23 MED ORDER — METRONIDAZOLE IN NACL 5-0.79 MG/ML-% IV SOLN
500.0000 mg | Freq: Once | INTRAVENOUS | Status: AC
  Administered 2018-06-24: 500 mg via INTRAVENOUS
  Filled 2018-06-23: qty 100

## 2018-06-23 MED ORDER — LEVOFLOXACIN IN D5W 500 MG/100ML IV SOLN
500.0000 mg | Freq: Once | INTRAVENOUS | Status: DC
  Filled 2018-06-23: qty 100

## 2018-06-23 MED ORDER — SODIUM CHLORIDE 0.9 % IV SOLN
INTRAVENOUS | Status: DC
  Administered 2018-06-23: 22:00:00 via INTRAVENOUS
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
## 2018-06-23 NOTE — ED Notes (Signed)
Hospitalist at bedside 
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
## 2018-06-23 NOTE — ED Notes (Signed)
X ray in room.

## 2018-06-23 NOTE — ED Notes (Signed)
Bed: RESB Expected date:  Expected time:  Means of arrival:  Comments: EMS 63 yo female bloody stools/diaphoretic orthostatic CBG 96

## 2018-06-23 NOTE — ED Notes (Signed)
Patient transported to CT 

## 2018-06-24 ENCOUNTER — Other Ambulatory Visit: Payer: Self-pay

## 2018-06-24 ENCOUNTER — Encounter (HOSPITAL_COMMUNITY): Payer: Self-pay

## 2018-06-24 ENCOUNTER — Inpatient Hospital Stay (HOSPITAL_COMMUNITY): Payer: Medicaid Other

## 2018-06-24 LAB — BASIC METABOLIC PANEL
Anion gap: 6 (ref 5–15)
BUN: 14 mg/dL (ref 8–23)
CO2: 17 mmol/L — ABNORMAL LOW (ref 22–32)
Calcium: 7.7 mg/dL — ABNORMAL LOW (ref 8.9–10.3)
Chloride: 116 mmol/L — ABNORMAL HIGH (ref 98–111)
Creatinine, Ser: 0.86 mg/dL (ref 0.44–1.00)
GFR calc Af Amer: >60 mL/min (ref >60)
GFR calc non Af Amer: >60 mL/min (ref >60)
Glucose, Bld: 101 mg/dL — ABNORMAL HIGH (ref 70–99)
Potassium: 3.6 mmol/L (ref 3.5–5.1)
Sodium: 139 mmol/L (ref 135–145)

## 2018-06-24 LAB — GASTROINTESTINAL PANEL BY PCR, STOOL (REPLACES STOOL CULTURE)

## 2018-06-24 LAB — RPR: RPR Ser Ql: NONREACTIVE

## 2018-06-24 LAB — CBC
HCT: 38.8 % (ref 36.0–46.0)
Hemoglobin: 12.2 g/dL (ref 12.0–15.0)
MCH: 29.3 pg (ref 26.0–34.0)
MCHC: 31.4 g/dL (ref 30.0–36.0)
MCV: 93.3 fL (ref 80.0–100.0)
Platelets: 269 10*3/uL (ref 150–400)
RBC: 4.16 MIL/uL (ref 3.87–5.11)
RDW: 14.9 % (ref 11.5–15.5)
WBC: 14.5 10*3/uL — ABNORMAL HIGH (ref 4.0–10.5)
nRBC: 0 % (ref 0.0–0.2)

## 2018-06-24 LAB — HIV ANTIBODY (ROUTINE TESTING W REFLEX): HIV Screen 4th Generation wRfx: NONREACTIVE

## 2018-06-24 LAB — SEDIMENTATION RATE: Sed Rate: 41 mm/hr — ABNORMAL HIGH (ref 0–22)

## 2018-06-24 LAB — TROPONIN I
Troponin I: <0.03 ng/mL (ref <0.03)
Troponin I: <0.03 ng/mL (ref <0.03)
Troponin I: <0.03 ng/mL (ref <0.03)

## 2018-06-24 LAB — C-REACTIVE PROTEIN: CRP: 17.6 mg/dL — ABNORMAL HIGH (ref <1.0)

## 2018-06-24 MED ORDER — SODIUM CHLORIDE 0.9 % IV BOLUS
1000.0000 mL | Freq: Once | INTRAVENOUS | Status: AC
  Administered 2018-06-24: 1000 mL via INTRAVENOUS

## 2018-06-24 MED ORDER — OFLOXACIN 0.3 % OP SOLN
1.0000 [drp] | Freq: Four times a day (QID) | OPHTHALMIC | Status: DC
  Administered 2018-06-24 – 2018-06-29 (×12): 1 [drp] via OPHTHALMIC
  Filled 2018-06-24 (×2): qty 5

## 2018-06-24 MED ORDER — KETOROLAC TROMETHAMINE 0.5 % OP SOLN
1.0000 [drp] | Freq: Four times a day (QID) | OPHTHALMIC | Status: DC
  Administered 2018-06-24 – 2018-06-29 (×20): 1 [drp] via OPHTHALMIC
  Filled 2018-06-24: qty 5

## 2018-06-24 MED ORDER — METRONIDAZOLE IN NACL 5-0.79 MG/ML-% IV SOLN
500.0000 mg | Freq: Three times a day (TID) | INTRAVENOUS | Status: DC
  Administered 2018-06-24 – 2018-06-26 (×6): 500 mg via INTRAVENOUS
  Filled 2018-06-24 (×7): qty 100

## 2018-06-24 MED ORDER — NICOTINE 14 MG/24HR TD PT24
14.0000 mg | MEDICATED_PATCH | Freq: Every day | TRANSDERMAL | Status: DC
  Administered 2018-06-24 – 2018-06-29 (×6): 14 mg via TRANSDERMAL
  Filled 2018-06-24 (×6): qty 1

## 2018-06-24 MED ORDER — ALBUTEROL SULFATE (2.5 MG/3ML) 0.083% IN NEBU
3.0000 mL | INHALATION_SOLUTION | Freq: Four times a day (QID) | RESPIRATORY_TRACT | Status: DC | PRN
  Administered 2018-06-26: 3 mL via RESPIRATORY_TRACT
  Filled 2018-06-24 (×2): qty 3

## 2018-06-24 MED ORDER — HYDROMORPHONE HCL 1 MG/ML IJ SOLN
2.0000 mg | INTRAMUSCULAR | Status: DC | PRN
  Administered 2018-06-24 – 2018-06-25 (×4): 2 mg via INTRAVENOUS
  Filled 2018-06-24 (×5): qty 2

## 2018-06-24 MED ORDER — DIPHENHYDRAMINE HCL 25 MG PO CAPS
25.0000 mg | ORAL_CAPSULE | Freq: Four times a day (QID) | ORAL | Status: DC | PRN
  Administered 2018-06-24 – 2018-06-25 (×3): 25 mg via ORAL
  Filled 2018-06-24 (×3): qty 1

## 2018-06-24 MED ORDER — PREDNISOLONE ACETATE 1 % OP SUSP
1.0000 [drp] | Freq: Four times a day (QID) | OPHTHALMIC | Status: DC
  Administered 2018-06-24 – 2018-06-29 (×20): 1 [drp] via OPHTHALMIC
  Filled 2018-06-24: qty 5

## 2018-06-24 MED ORDER — PANTOPRAZOLE SODIUM 40 MG PO TBEC
40.0000 mg | DELAYED_RELEASE_TABLET | Freq: Two times a day (BID) | ORAL | Status: DC
  Administered 2018-06-24 – 2018-06-29 (×11): 40 mg via ORAL
  Filled 2018-06-24 (×11): qty 1

## 2018-06-24 MED ORDER — SODIUM CHLORIDE 0.9 % IV SOLN
INTRAVENOUS | Status: DC
  Administered 2018-06-24 (×2): via INTRAVENOUS
  Administered 2018-06-25: 1000 mL via INTRAVENOUS
  Administered 2018-06-26: 02:00:00 via INTRAVENOUS

## 2018-06-24 MED ORDER — SODIUM CHLORIDE 0.9 % IV SOLN
2.0000 g | Freq: Every day | INTRAVENOUS | Status: DC
  Administered 2018-06-24 – 2018-06-25 (×3): 2 g via INTRAVENOUS
  Filled 2018-06-24: qty 2
  Filled 2018-06-24 (×2): qty 20
  Filled 2018-06-24: qty 2

## 2018-06-24 NOTE — Progress Notes (Signed)
RN admit note. Pt received from ED, Report rec'd telephonically from [facility]. Arrived via stretcher, assistance needed to transfer from stretcher to bed. Very sleepy, oriented to unit, room, call bell, and placed on falls precautions. Aware not to get out without assistance. Verbalized understanding. Large bloody bowel movement in the bed on arrival. Perineal care and full linen changed completed.  See Assessment flowsheet for details. Unable to complete Admission history due to falling alseep. No s/s of distress noted.. Will continue to monitor pt. Closely. Willene Hatchet, RN

## 2018-06-24 NOTE — ED Triage Notes (Signed)
Pt arrived ems, complaining of blood in her stool and weakness for 24 hrs. Pt has hx of opioid abuse and alert x4.
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
## 2018-06-24 NOTE — Progress Notes (Signed)
COVID questionnaire completed, pt reported 5 symptoms on checklist, COVID test in [facility] ED negative. Pt. continues wearing mask. Made MD aware. Will continue to monitor pt. Closely. Willene Hatchet, RN

## 2018-06-24 NOTE — Plan of Care (Signed)
POC initiated 

## 2018-06-24 NOTE — ED Notes (Signed)
Called purple man  floor no answer

## 2018-06-24 NOTE — Progress Notes (Signed)
PROGRESS NOTE    [name]  ZHY:[id number] DOB: 1955/12/08 DOA: 06/23/2018 PCP: Nolene Ebbs, MD    Brief Narrative:   [name] is a 63 y.o. female with medical history significant for polysubstance abuse (cocaine, opioids), htn, ckd3 rheumatoid arthritis, ckd3, tobacco abuse, questionable PE 2017 (indeterminate vq scan, doesn't appear she is currently anticoagulated), spinal stenosis, and episode of ischemic colitis in 2018 thought to be 2/2 cocaine use, presents with above.  Patient reports about 5 days of symptoms, gradually worsening and now much worse today. Has diffuse abdominal pain more on the left than on the right. Constant and also crampy. Was initially constipated and then a few days ago began to pass small amounts of bright red blood when attempted to defecate. Today had increased abdominal pain and had a large volume bowel movement that contained what she describes as a significant amount of bright red blood. Mild nausea, no vomiting. Patient is unsure what meds she takes, says she is given a pill pack. Says she takes some sort of opioid, unsure what. Last cocaine use 3 days ago. No fevers. No recent travel or sick contacts.   Colonoscopy performed in 2018 after similar presentation, findings suggestive of ischemic colitis, thought to be 2/2 cocaine use.   ED Course: labs, imaging, antibiotics, fluids, pain control   Assessment & Plan:   Principal Problem:   Colitis Active Problems:   Rheumatoid arthritis ([place])   HTN (hypertension), benign   Cocaine abuse (HCC)   Tobacco abuse   COPD (chronic obstructive pulmonary disease) (HCC)   Hematochezia  Colitis, suspect ischemic in the setting of cocaine abuse Lower GI bleed Patient presenting with 5 days of gradually worsening cramping diffuse abdominal pain now associated with bright red blood per rectum.  CT abdomen/pelvis notable for transverse and descending colon wall thickening consistent with infectious  versus inflammatory versus ischemic colitis.  History of similar episode August 2018 with colonoscopy and pathology notable for focal active colitis and hyperemic changes and negative for malignancy.  Continues with active cocaine use.  C. difficile negative.  SARS-CoV-2 negative.  Urinalysis unrevealing and urine hCG negative. --Gastroenterology, Dr. Collene Mares consulted; appreciate assistance --WBC 10.7-->14.5; afebrile --Hgb 12.2, stable --ESR 41 --CRP 17.6 --FOBT positive --Continues on ceftriaxone 2 g IV daily and Flagyl 500 mg every 8 hours --GI PCR panel: Pending --N.p.o., NS at 100 ml/hr --Supportive care  Atypical chest pain Patient this morning describing throbbing, constant supraclavicular pain with radiation to her left arm.  EKG unrevealing with notable for normal sinus rhythm without any concerning ST elevation/depression.  Troponin <0.03, and chest x-ray with no acute cardiopulmonary process.  Patient underwent nuclear medicine stress test April 2019 with EF 61% normal LV wall motion and no reversible ischemia or infarction.  This is likely secondary to her high anxiety state as well with continued cocaine abuse. --Continue to monitor on telemetry --Supportive measures  Essential hypertension Patient reports history of hypertension on medication at home.  Cannot recall as she gets them in blister packs from her pharmacy.  Pharmacy current closed until Monday morning.  Only well controlled 109/65.  Has been as low as 84/62. --Continue to monitor blood pressure closely, no need for antihypertensives at this time  Hx rheumatoid arthritis Patient denies any DMARD medication. Has been on prednisone 5 mg p.o. daily in the past.  Unable to clearly define what medications she is on currently.  Her pharmacy is currently closed, will attempt to contact pharmacy tomorrow for updated  list. ° °Chronic pain syndrome °Patient previously followed by the pain clinic, but was discharged from  their clinic due to losing her pain medicine, and Suboxone levels previously undetectable, and history of persistent cocaine abuse. ° °Polysubstance abuse with tobacco, opiates, cocaine °UDS positive for cocaine.  Patient endorses cocaine use last 3 days ago.  Discussed with patient need for complete cessation as this is likely cause of her presenting symptoms. ° °GERD: Continue PPI twice daily ° ° °DVT prophylaxis: SCDs, chemical DVT prophylaxis contraindicated in acute GI bleed °Code Status: Full code °Family Communication: None °Disposition Plan: Anticipate discharge home when medically ready, currently lives with boyfriend in an apartment ° ° °Consultants:  °· Gastroenterology, Dr. Mann ° °Procedures:  °· none ° °Antimicrobials:  °· Ceftriaxone 6/12>> °· Flagyl 6/12>> ° ° °Subjective: °Called to bedside by nursing this morning, patient was complaining of left chest discomfort which was supraclavicular.  Patient had a hot pack in place.  She reports throbbing, cramping pain that was constant with radiation to her left shoulder.  EKG unrevealing, chest x-ray also without acute cardiopulmonary disease process.  Initial troponin negative.  Patient with positive cocaine on UDS coupled with her anxiety state likely leading etiology.  Patient with a previous nuc med stress test April 2019 with normal findings.  Patient admitted with bright red blood per rectum with continued bleeding likely secondary to ischemic colitis as previous hospitalization in 2018.  Discussed with patient need for complete cessation of cocaine; in which she endorsed use of 3 days ago.  He did complain of crampy abdominal pain with persistent blood in her stool.  Feels weak and fatigued.  No other complaints at this time.  Denies headache, no fever/chills/night sweats, no palpitations, no nausea/vomiting/diarrhea, no cough/congestion, no paresthesias.  No other acute events overnight per nursing staff. ° °Objective: °Vitals:  ° 06/24/18 0605  06/24/18 0630 06/24/18 0631 06/24/18 0748  °BP: (!) 94/52 (!) 84/62 (!) 86/58 109/65  °Pulse: 97   61  °Resp: (!) 24   (!) 21  °Temp: 98.5 °F (36.9 °C)   97.9 °F (36.6 °C)  °TempSrc: Oral   Oral  °SpO2: 100%   100%  °Weight:      °Height:      ° ° °Intake/Output Summary (Last 24 hours) at 06/24/2018 1251 °Last data filed at 06/24/2018 1217 °Gross per 24 hour  °Intake 1862.16 ml  °Output 50 ml  °Net 1812.16 ml  ° °Filed Weights  ° 06/24/18 0515  °Weight: 78.9 kg  ° ° °Examination: ° °General exam: Appears calm and comfortable  °Respiratory system: Clear to auscultation. Respiratory effort normal. °Cardiovascular system: S1 & S2 heard, RRR. No JVD, murmurs, rubs, gallops or clicks. No pedal edema. °Gastrointestinal system: Abdomen is protuberant, nondistended, soft, mild left lower quadrant tenderness,. No organomegaly or masses felt. Normal bowel sounds heard. °Central nervous system: Alert and oriented. No focal neurological deficits. °Extremities: Symmetric 5 x 5 power. °Skin: No rashes, lesions or ulcers °Psychiatry: Judgement and insight appear normal. Mood & affect appropriate.  ° ° ° °Data Reviewed: I have personally reviewed following labs and imaging studies ° °CBC: °Recent Labs  °Lab 06/23/18 °1957 06/24/18 °0527  °WBC 10.7* 14.5*  °NEUTROABS 7.9*  --   °HGB 13.7 12.2  °HCT 42.4 38.8  °MCV 91.8 93.3  °PLT 261 269  ° °Basic Metabolic Panel: °Recent Labs  °Lab 06/23/18 °1957 06/24/18 °0527  °NA 139 139  °K 3.5 3.6  °CL 111 116*  °CO2 17*   17* 17*  GLUCOSE 107* 101*  BUN 21 14  CREATININE 1.17* 0.86  CALCIUM 8.4* 7.7*   GFR: Estimated Creatinine Clearance: 70.8 mL/min (by C-G formula based on SCr of 0.86 mg/dL). Liver Function Tests: Recent Labs  Lab 06/23/18 1957  AST 13*  ALT 10  ALKPHOS 101  BILITOT 0.5  PROT 6.9  ALBUMIN 3.3*   Recent Labs  Lab 06/23/18 2017  LIPASE 22   No results for input(s): AMMONIA in the last 168 hours. Coagulation Profile: Recent Labs  Lab 06/23/18 2017  INR 0.9    Cardiac Enzymes: Recent Labs  Lab 06/24/18 0803  TROPONINI <0.03   BNP (last 3 results) No results for input(s): PROBNP in the last 8760 hours. HbA1C: No results for input(s): HGBA1C in the last 72 hours. CBG: No results for input(s): GLUCAP in the last 168 hours. Lipid Profile: No results for input(s): CHOL, HDL, LDLCALC, TRIG, CHOLHDL, LDLDIRECT in the last 72 hours. Thyroid Function Tests: No results for input(s): TSH, T4TOTAL, FREET4, T3FREE, THYROIDAB in the last 72 hours. Anemia Panel: No results for input(s): VITAMINB12, FOLATE, FERRITIN, TIBC, IRON, RETICCTPCT in the last 72 hours. Sepsis Labs: Recent Labs  Lab 06/23/18 2017  LATICACIDVEN 1.5    Recent Results (from the past 240 hour(s))  C Difficile Quick Screen w PCR reflex     Status: Abnormal   Collection Time: 06/23/18  8:35 PM   Specimen: STOOL  Result Value Ref Range Status   C Diff antigen NEGATIVE (A) NEGATIVE Final   C Diff toxin NEGATIVE (A) NEGATIVE Final   C Diff interpretation VALID  Final    Comment: Performed at [facility] [place] [address], [place], [place] [address]  SARS Coronavirus 2 (CEPHEID - Performed in [facility] hospital lab), Hosp Order     Status: None   Collection Time: 06/23/18  8:35 PM   Specimen: Nasopharyngeal Swab  Result Value Ref Range Status   SARS Coronavirus 2 NEGATIVE NEGATIVE Final    Comment: (NOTE) If result is NEGATIVE SARS-CoV-2 target nucleic acids are NOT DETECTED. The SARS-CoV-2 RNA is generally detectable in upper and lower  respiratory specimens during the acute phase of infection. The lowest  concentration of SARS-CoV-2 viral copies this assay can detect is 250  copies / mL. A negative result does not preclude SARS-CoV-2 infection  and should not be used as the sole basis for treatment or other  patient management decisions.  A negative result may occur with  improper specimen collection / handling, submission of specimen other  than  nasopharyngeal swab, presence of viral mutation(s) within the  areas targeted by this assay, and inadequate number of viral copies  (<250 copies / mL). A negative result must be combined with clinical  observations, patient history, and epidemiological information. If result is POSITIVE SARS-CoV-2 target nucleic acids are DETECTED. The SARS-CoV-2 RNA is generally detectable in upper and lower  respiratory specimens dur ing the acute phase of infection.  Positive  results are indicative of active infection with SARS-CoV-2.  Clinical  correlation with patient history and other diagnostic information is  necessary to determine patient infection status.  Positive results do  not rule out bacterial infection or co-infection with other viruses. If result is PRESUMPTIVE POSTIVE SARS-CoV-2 nucleic acids MAY BE PRESENT.   A presumptive positive result was obtained on the submitted specimen  and confirmed on repeat testing.  While 2019 novel coronavirus  (SARS-CoV-2) nucleic acids may be present in the submitted  additional confirmatory testing may be necessary for epidemiological  °and / or clinical management purposes  to differentiate between  °SARS-CoV-2 and other Sarbecovirus currently known to infect humans.  °If clinically indicated additional testing with an alternate test  °methodology ([id number]) is advised. The SARS-CoV-2 RNA is generally  °detectable in upper and lower respiratory sp °ecimens during the acute  °phase of infection. °The expected result is Negative. °Fact Sheet for Patients:  https://www.fda.gov/media/136312/download °Fact Sheet for Healthcare Providers: °https://www.fda.gov/media/136313/download °This test is not yet approved or cleared by the [place] FDA and °has been authorized for detection and/or diagnosis of SARS-CoV-2 by °FDA under an Emergency Use Authorization (EUA).  This EUA will remain °in effect (meaning this test can be used) for the duration of  the °COVID-19 declaration under Section 564(b)(1) of the Act, 21 U.S.C. °section 360bbb-3(b)(1), unless the authorization is terminated or °revoked sooner. °Performed at [facility], [address] °[place], [place] [address] °  °  ° ° ° ° ° °Radiology Studies: °Ct Abdomen Pelvis Wo Contrast ° °Result Date: 06/23/2018 °CLINICAL DATA:  Bloody stools. EXAM: CT ABDOMEN AND PELVIS WITHOUT CONTRAST TECHNIQUE: Multidetector CT imaging of the abdomen and pelvis was performed following the standard protocol without IV contrast. COMPARISON:  CT dated 08/28/2016. FINDINGS: Lower chest: There is scarring versus atelectasis at the right lung base. Hepatobiliary: No focal liver abnormality is seen. No gallstones, gallbladder wall thickening, or biliary dilatation. Pancreas: Unremarkable. No pancreatic ductal dilatation or surrounding inflammatory changes. Spleen: Normal in size without focal abnormality. Adrenals/Urinary Tract: Adrenal glands are unremarkable. Kidneys are normal, without renal calculi, focal lesion, or hydronephrosis. Bladder is unremarkable. Stomach/Bowel: There is a large hiatal hernia. There is diffuse wall thickening of the transverse colon, splenic flexure, and descending colon. The appendix is unremarkable. There is no evidence of a small-bowel obstruction. Vascular/Lymphatic: Aortic atherosclerosis. No enlarged abdominal or pelvic lymph nodes. Reproductive: Status post hysterectomy. No adnexal masses. Other: No abdominal wall hernia or abnormality. No abdominopelvic ascites. Musculoskeletal: No acute or significant osseous findings. There is a stable metallic foreign body at the level of the sacrum. There are degenerative changes throughout the visualized thoracolumbar spine. IMPRESSION: 1. Diffuse wall thickening of the transverse colon, splenic flexure, and descending colon is consistent with infectious or inflammatory colitis. In this location, ischemic colitis is also a  possibility. 2. Large hiatal hernia. Electronically Signed   By: Christopher  Green M.D.   On: 06/23/2018 22:44  ° °Dg Chest 1 View ° °Result Date: 06/23/2018 °CLINICAL DATA:  Shortness of breath EXAM: CHEST  1 VIEW COMPARISON:  04/13/2017 FINDINGS: Heart and mediastinal contours are within normal limits. No focal opacities or effusions. No acute bony abnormality. Old healed left rib fractures. IMPRESSION: No active cardiopulmonary disease. Electronically Signed   By: Kevin  Dover M.D.   On: 06/23/2018 20:34  ° ° ° ° ° ° °Scheduled Meds: °• nicotine  14 mg Transdermal Daily  °• pantoprazole  40 mg Oral BID  ° °Continuous Infusions: °• sodium chloride Stopped (06/24/18 0644)  °• sodium chloride 100 mL/hr at 06/24/18 1217  °• cefTRIAXone (ROCEPHIN)  IV Stopped (06/24/18 0352)  °• metronidazole Stopped (06/24/18 0824)  ° ° ° LOS: 1 day  ° ° °Time spent: 32 minutes ° ° ° ° J , DO °[facility] °Pager [phone] ° °If 7PM-7AM, please contact night-coverage [email or web address] °Password [email or web address] °06/24/2018, 12:51 PM  ° °

## 2018-06-24 NOTE — Progress Notes (Signed)
[place] provider, [place] notified, BP 94/52 automatic and manually 86/58. Pt. Asymptomatic. Waiting return call. No s/s of acute distress. Will continue to monitor pt. Closely. Willene Hatchet RN

## 2018-06-25 LAB — CBC
HCT: 36.5 % (ref 36.0–46.0)
Hemoglobin: 11.6 g/dL — ABNORMAL LOW (ref 12.0–15.0)
MCH: 29.6 pg (ref 26.0–34.0)
MCHC: 31.8 g/dL (ref 30.0–36.0)
MCV: 93.1 fL (ref 80.0–100.0)
Platelets: 236 10*3/uL (ref 150–400)
RBC: 3.92 MIL/uL (ref 3.87–5.11)
RDW: 15.3 % (ref 11.5–15.5)
WBC: 12.4 10*3/uL — ABNORMAL HIGH (ref 4.0–10.5)
nRBC: 0 % (ref 0.0–0.2)

## 2018-06-25 LAB — BASIC METABOLIC PANEL
Anion gap: 8 (ref 5–15)
BUN: 8 mg/dL (ref 8–23)
CO2: 19 mmol/L — ABNORMAL LOW (ref 22–32)
Calcium: 8 mg/dL — ABNORMAL LOW (ref 8.9–10.3)
Chloride: 114 mmol/L — ABNORMAL HIGH (ref 98–111)
Creatinine, Ser: 0.76 mg/dL (ref 0.44–1.00)
GFR calc Af Amer: >60 mL/min (ref >60)
GFR calc non Af Amer: >60 mL/min (ref >60)
Glucose, Bld: 73 mg/dL (ref 70–99)
Potassium: 3.3 mmol/L — ABNORMAL LOW (ref 3.5–5.1)
Sodium: 141 mmol/L (ref 135–145)

## 2018-06-25 LAB — MAGNESIUM: Magnesium: 1.6 mg/dL — ABNORMAL LOW (ref 1.7–2.4)

## 2018-06-25 MED ORDER — OXYCODONE-ACETAMINOPHEN 5-325 MG PO TABS
1.0000 | ORAL_TABLET | Freq: Four times a day (QID) | ORAL | Status: DC | PRN
  Administered 2018-06-25 – 2018-06-29 (×13): 2 via ORAL
  Filled 2018-06-25 (×14): qty 2

## 2018-06-25 MED ORDER — MAGNESIUM SULFATE 2 GM/50ML IV SOLN
2.0000 g | Freq: Once | INTRAVENOUS | Status: AC
  Administered 2018-06-25: 2 g via INTRAVENOUS
  Filled 2018-06-25: qty 50

## 2018-06-25 MED ORDER — ADULT MULTIVITAMIN W/MINERALS CH
1.0000 | ORAL_TABLET | Freq: Every day | ORAL | Status: DC
  Administered 2018-06-25 – 2018-06-29 (×5): 1 via ORAL
  Filled 2018-06-25 (×5): qty 1

## 2018-06-25 MED ORDER — DIPHENHYDRAMINE HCL 50 MG/ML IJ SOLN
12.5000 mg | Freq: Once | INTRAMUSCULAR | Status: AC
  Administered 2018-06-25: 12.5 mg via INTRAVENOUS
  Filled 2018-06-25: qty 1

## 2018-06-25 MED ORDER — HYDROCODONE-ACETAMINOPHEN 5-325 MG PO TABS
1.0000 | ORAL_TABLET | Freq: Four times a day (QID) | ORAL | Status: DC | PRN
  Administered 2018-06-25 (×2): 1 via ORAL
  Filled 2018-06-25 (×2): qty 1

## 2018-06-25 MED ORDER — BOOST / RESOURCE BREEZE PO LIQD CUSTOM
1.0000 | Freq: Three times a day (TID) | ORAL | Status: DC
  Administered 2018-06-25 – 2018-06-29 (×10): 1 via ORAL

## 2018-06-25 MED ORDER — POTASSIUM CHLORIDE CRYS ER 20 MEQ PO TBCR
40.0000 meq | EXTENDED_RELEASE_TABLET | Freq: Once | ORAL | Status: AC
  Administered 2018-06-25: 40 meq via ORAL
  Filled 2018-06-25: qty 2

## 2018-06-25 NOTE — Evaluation (Signed)
Physical Therapy Evaluation Patient Details Name: Emmanuel Ercole MRN: [id number] DOB: 1955-01-13 Today's Date: 06/25/2018   History of Present Illness  Aala Ransom is a 63 y.o. female admitted with ischemic colitis.  Her medical history is significant for polysubstance abuse (cocaine, opioids), htn, ckd3 rheumatoid arthritis, ckd3, tobacco abuse, questionable PE 2017, spinal stenosis, and episode of ischemic colitis in 2018 thought to be 2/2 cocaine use  Clinical Impression  Pt admitted with above diagnosis. Pt currently with functional limitations due to the deficits listed below (see PT Problem List).  Pt will benefit from skilled PT to increase their independence and safety with mobility to allow discharge to the venue listed below.  Pt fatigued quickly with just a few steps to the recliner.  At this time, recommend SNF as she states her boyfriend is having health issues and would be unable to help her at time of d/c.     Follow Up Recommendations SNF    Equipment Recommendations  None recommended by PT    Recommendations for Other Services       Precautions / Restrictions Precautions Precautions: Fall Restrictions Weight Bearing Restrictions: No      Mobility  Bed Mobility Overal bed mobility: Needs Assistance Bed Mobility: Supine to Sit     Supine to sit: Min assist     General bed mobility comments: MIN A to get trunk upright  Transfers Overall transfer level: Needs assistance Equipment used: Rolling walker (2 wheeled) Transfers: Sit to/from Stand Sit to Stand: Min assist         General transfer comment: cues for hand placement.  MIN A to power up.  Ambulation/Gait Ambulation/Gait assistance: Min assist Gait Distance (Feet): 2 Feet Assistive device: Rolling walker (2 wheeled)       General Gait Details: Pt took a few steps from bed to recliner with RW c/o weakness and pain. She was very fatigued after sitting down.  Stairs             Wheelchair Mobility    Modified Rankin (Stroke Patients Only)       Balance Overall balance assessment: Needs assistance   Sitting balance-Leahy Scale: Fair       Standing balance-Leahy Scale: Poor Standing balance comment: reliant on UE support                             Pertinent Vitals/Pain Pain Assessment: 0-10 Pain Score: 9  Pain Location: lower abdomen Pain Descriptors / Indicators: Aching Pain Intervention(s): Limited activity within patient's tolerance;Monitored during session;Patient requesting pain meds-RN notified;Repositioned    Home Living Family/patient expects to be discharged to:: Private residence Living Arrangements: Spouse/significant other Available Help at Discharge: Friend(s);Available PRN/intermittently Type of Home: Apartment Home Access: Elevator     Home Layout: One level Home Equipment: Walker - 4 wheels;Grab bars - tub/shower;Grab bars - toilet;Cane - single point Additional Comments: Lives with boyfriend who is going through his own health issues right now.    Prior Function Level of Independence: Independent with assistive device(s)         Comments: AMb with cane in the apartment and rollator for community distances     Hand Dominance        Extremity/Trunk Assessment   Upper Extremity Assessment Upper Extremity Assessment: Generalized weakness    Lower Extremity Assessment Lower Extremity Assessment: Generalized weakness    Cervical / Trunk Assessment Cervical / Trunk Assessment: Normal  Communication   Communication:  No difficulties  Cognition Arousal/Alertness: Awake/alert Behavior During Therapy: WFL for tasks assessed/performed Overall Cognitive Status: Within Functional Limits for tasks assessed                                        General Comments      Exercises     Assessment/Plan    PT Assessment Patient needs continued PT services  PT Problem List Decreased  strength;Decreased activity tolerance;Decreased balance;Decreased mobility;Pain       PT Treatment Interventions DME instruction;Gait training;Functional mobility training;Therapeutic exercise;Therapeutic activities;Balance training    PT Goals (Current goals can be found in the Care Plan section)  Acute Rehab PT Goals Patient Stated Goal: decrease pain and get stronger PT Goal Formulation: With patient Time For Goal Achievement: 07/09/18 Potential to Achieve Goals: Good    Frequency Min 2X/week   Barriers to discharge Decreased caregiver support boyfriend dealing with his own health issues.    Co-evaluation               AM-PAC PT "6 Clicks" Mobility  Outcome Measure Help needed turning from your back to your side while in a flat bed without using bedrails?: A Little Help needed moving from lying on your back to sitting on the side of a flat bed without using bedrails?: A Lot Help needed moving to and from a bed to a chair (including a wheelchair)?: A Little Help needed standing up from a chair using your arms (e.g., wheelchair or bedside chair)?: A Little Help needed to walk in hospital room?: A Lot Help needed climbing 3-5 steps with a railing? : A Lot 6 Click Score: 15    End of Session   Activity Tolerance: Patient limited by pain;Patient limited by fatigue Patient left: in chair;with call bell/phone within reach;with chair alarm set Nurse Communication: Mobility status;Patient requests pain meds PT Visit Diagnosis: Muscle weakness (generalized) (M62.81);Other abnormalities of gait and mobility (R26.89);Unsteadiness on feet (R26.81)    Time: 2409-7353 PT Time Calculation (min) (ACUTE ONLY): 18 min   Charges:   PT Evaluation $PT Eval Moderate Complexity: 1 Mod          Melodye Swor L. Tamala Julian, [place] Pager [phone] 06/25/2018   Galen Manila 06/25/2018, 3:06 PM

## 2018-06-25 NOTE — Progress Notes (Signed)
PROGRESS NOTE    [name]  JIR:[id number] DOB: [birth year] DOA: 06/23/2018 PCP: Nolene Ebbs, MD    Brief Narrative:   [name] is a 63 y.o. female with medical history significant for polysubstance abuse (cocaine, opioids), htn, ckd3 rheumatoid arthritis, ckd3, tobacco abuse, questionable PE 2017 (indeterminate vq scan, doesn't appear she is currently anticoagulated), spinal stenosis, and episode of ischemic colitis in 2018 thought to be 2/2 cocaine use, presents with above.  Patient reports about 5 days of symptoms, gradually worsening and now much worse today. Has diffuse abdominal pain more on the left than on the right. Constant and also crampy. Was initially constipated and then a few days ago began to pass small amounts of bright red blood when attempted to defecate. Today had increased abdominal pain and had a large volume bowel movement that contained what she describes as a significant amount of bright red blood. Mild nausea, no vomiting. Patient is unsure what meds she takes, says she is given a pill pack. Says she takes some sort of opioid, unsure what. Last cocaine use 3 days ago. No fevers. No recent travel or sick contacts.   Colonoscopy performed in 2018 after similar presentation, findings suggestive of ischemic colitis, thought to be 2/2 cocaine use.   ED Course: labs, imaging, antibiotics, fluids, pain control   Assessment & Plan:   Principal Problem:   Colitis Active Problems:   Rheumatoid arthritis ([place])   HTN (hypertension), benign   Cocaine abuse (HCC)   Tobacco abuse   COPD (chronic obstructive pulmonary disease) (HCC)   Hematochezia  Colitis, suspect ischemic in the setting of cocaine abuse Lower GI bleed Patient presenting with 5 days of gradually worsening cramping diffuse abdominal pain now associated with bright red blood per rectum.  CT abdomen/pelvis notable for transverse and descending colon wall thickening consistent with infectious  versus inflammatory versus ischemic colitis.  History of similar episode August 2018 with colonoscopy and pathology notable for focal active colitis and hyperemic changes and negative for malignancy.  Continues with active cocaine use.  C. difficile negative.  SARS-CoV-2 negative.  ESR elevated at 41 and CRP 17.6.  FOBT positive.  Urinalysis unrevealing and urine hCG negative.  Evaluated by gastroenterology, Dr. Collene Mares; CT findings suggestive of ischemic colitis especially in the setting of continued cocaine abuse and no further indication of repeat colonoscopy at this time as this was recently done in 2018 under similar circumstances. --Gastroenterology, Dr. Collene Mares consulted; appreciate assistance --WBC 10.7-->14.5-->12.4; afebrile --Hgb 12.2-->11.6, stable --Continues on ceftriaxone 2 g IV daily and Flagyl 500 mg every 8 hours --GI PCR panel: negative --clear liquid diet, advance as tolerated --Supportive care  Atypical chest pain Patient this morning describing throbbing, constant supraclavicular pain with radiation to her left arm.  EKG unrevealing with notable for normal sinus rhythm without any concerning ST elevation/depression.  Troponin <0.03, and chest x-ray with no acute cardiopulmonary process.  Patient underwent nuclear medicine stress test April 2019 with EF 61% normal LV wall motion and no reversible ischemia or infarction.  This is likely secondary to her high anxiety state as well with continued cocaine abuse. --Continue to monitor on telemetry --Supportive measures  Essential hypertension Patient reports history of hypertension on medication at home.  Cannot recall as she gets them in blister packs from her pharmacy.  Pharmacy current closed until Monday morning.  Only well controlled 109/65.  Has been as low as 84/62. --Continue to monitor blood pressure closely, no need for antihypertensives at this time  Hx rheumatoid arthritis Patient denies any DMARD medication. Has been on  prednisone 5 mg p.o. daily in the past.  Unable to clearly define what medications she is on currently.  Her pharmacy is currently closed, will attempt to contact pharmacy monday for updated medication list.  Chronic pain syndrome Patient previously followed by the pain clinic, but was discharged from their clinic due to losing her pain medicine, and Suboxone levels previously undetectable, and history of persistent cocaine abuse. --Discontinue Dilaudid today --Hydrocodone/acetaminophen 5 mg / 325 mg p.o. every 6 hours as needed  Polysubstance abuse with tobacco, opiates, cocaine UDS positive for cocaine.  Patient endorses cocaine use last 3 days ago.  Discussed with patient need for complete cessation as this is likely cause of her presenting symptoms.  GERD: Continue PPI twice daily   DVT prophylaxis: SCDs, chemical DVT prophylaxis contraindicated in acute GI bleed Code Status: Full code Family Communication: None Disposition Plan: Anticipate discharge home when medically ready, currently lives with boyfriend in an apartment   Consultants:   Gastroenterology, Dr. Collene Mares  Procedures:   none  Antimicrobials:   Ceftriaxone 6/12>>  Flagyl 6/12>>   Subjective: Patient resting, in bed, eating breakfast.  States stomach feels full.  Seen by gastroenterology yesterday without any further recommendations as this is likely related to ischemic colitis from cocaine abuse.  Patient reports blood in her stool early this morning.  Hemoglobin remained stable.  Continues with weakness and fatigue.  Patient with no other complaints or concerns at this time. Denies headache, no fever/chills/night sweats, no palpitations, no nausea/vomiting/diarrhea, no cough/congestion, no paresthesias.  No other acute events overnight per nursing staff.  Objective: Vitals:   06/24/18 0748 06/24/18 1619 06/24/18 2135 06/25/18 0452  BP: 109/65 130/73 133/71 (!) 150/77  Pulse: 61 79 70 66  Resp: (!) _0 Temp: 97.9 F (36.6 C) 97.9 F (36.6 C) 97.8 F (36.6 C) 97.8 F (36.6 C)  TempSrc: Oral Oral Oral Oral  SpO2: 100% 98% 100% 97%  Weight:      Height:        Intake/Output Summary (Last 24 hours) at 06/25/2018 1243 Last data filed at 06/25/2018 0954 Gross per 24 hour  Intake 2084.59 ml  Output 400 ml  Net 1684.59 ml   Filed Weights   06/24/18 0515  Weight: 78.9 kg    Examination:  General exam: Appears calm and comfortable  Respiratory system: Clear to auscultation. Respiratory effort normal. Cardiovascular system: S1 & S2 heard, RRR. No JVD, murmurs, rubs, gallops or clicks. No pedal edema. Gastrointestinal system: Abdomen is protuberant, nondistended, soft, mild left lower quadrant tenderness,. No organomegaly or masses felt. Normal bowel sounds heard. Central nervous system: Alert and oriented. No focal neurological deficits. Extremities: Symmetric 5 x 5 power. Skin: No rashes, lesions or ulcers Psychiatry: Judgement and insight appear normal. Mood & affect appropriate.     Data Reviewed: I have personally reviewed following labs and imaging studies  CBC: Recent Labs  Lab 06/23/18 1957 06/24/18 0527 06/25/18 0551  WBC 10.7* 14.5* 12.4*  NEUTROABS 7.9*  --   --   HGB 13.7 12.2 11.6*  HCT 42.4 38.8 36.5  MCV 91.8 93.3 93.1  PLT 261 269 182   Basic Metabolic Panel: Recent Labs  Lab 06/23/18 1957 06/24/18 0527 06/25/18 0551  NA 139 139 141  K 3.5 3.6 3.3*  CL 111 116* 114*  CO2 17* 17* 19*  GLUCOSE 107* 101* 73  BUN 21 14  8  CREATININE 1.17* 0.86 0.76  CALCIUM 8.4* 7.7* 8.0*  MG  --   --  1.6*   GFR: Estimated Creatinine Clearance: 76.2 mL/min (by C-G formula based on SCr of 0.76 mg/dL). Liver Function Tests: Recent Labs  Lab 06/23/18 1957  AST 13*  ALT 10  ALKPHOS 101  BILITOT 0.5  PROT 6.9  ALBUMIN 3.3*   Recent Labs  Lab 06/23/18 2017  LIPASE 22   No results for input(s): AMMONIA in the last 168 hours. Coagulation  Profile: Recent Labs  Lab 06/23/18 2017  INR 0.9   Cardiac Enzymes: Recent Labs  Lab 06/24/18 0803 06/24/18 1326 06/24/18 1947  TROPONINI <0.03 <0.03 <0.03   BNP (last 3 results) No results for input(s): PROBNP in the last 8760 hours. HbA1C: No results for input(s): HGBA1C in the last 72 hours. CBG: No results for input(s): GLUCAP in the last 168 hours. Lipid Profile: No results for input(s): CHOL, HDL, LDLCALC, TRIG, CHOLHDL, LDLDIRECT in the last 72 hours. Thyroid Function Tests: No results for input(s): TSH, T4TOTAL, FREET4, T3FREE, THYROIDAB in the last 72 hours. Anemia Panel: No results for input(s): VITAMINB12, FOLATE, FERRITIN, TIBC, IRON, RETICCTPCT in the last 72 hours. Sepsis Labs: Recent Labs  Lab 06/23/18 2017  LATICACIDVEN 1.5    Recent Results (from the past 240 hour(s))  Gastrointestinal Panel by PCR , Stool     Status: None   Collection Time: 06/23/18  8:22 PM   Specimen: STOOL  Result Value Ref Range Status   Campylobacter species NOT DETECTED NOT DETECTED Final   Plesimonas shigelloides NOT DETECTED NOT DETECTED Final   Salmonella species NOT DETECTED NOT DETECTED Final   Yersinia enterocolitica NOT DETECTED NOT DETECTED Final   Vibrio species NOT DETECTED NOT DETECTED Final   Vibrio cholerae NOT DETECTED NOT DETECTED Final   Enteroaggregative E coli (EAEC) NOT DETECTED NOT DETECTED Final   Enteropathogenic E coli (EPEC) NOT DETECTED NOT DETECTED Final   Enterotoxigenic E coli (ETEC) NOT DETECTED NOT DETECTED Final   Shiga like toxin producing E coli (STEC) NOT DETECTED NOT DETECTED Final   Shigella/Enteroinvasive E coli (EIEC) NOT DETECTED NOT DETECTED Final   Cryptosporidium NOT DETECTED NOT DETECTED Final   Cyclospora cayetanensis NOT DETECTED NOT DETECTED Final   Entamoeba histolytica NOT DETECTED NOT DETECTED Final   Giardia lamblia NOT DETECTED NOT DETECTED Final   Adenovirus F40/41 NOT DETECTED NOT DETECTED Final   Astrovirus NOT DETECTED  NOT DETECTED Final   Norovirus GI/GII NOT DETECTED NOT DETECTED Final   Rotavirus A NOT DETECTED NOT DETECTED Final   Sapovirus (I, II, IV, and V) NOT DETECTED NOT DETECTED Final    Comment: Performed at [facility], [address] [place], [place] [address]  C Difficile Quick Screen w PCR reflex     Status: Abnormal   Collection Time: 06/23/18  8:35 PM   Specimen: STOOL  Result Value Ref Range Status   C Diff antigen NEGATIVE (A) NEGATIVE Final   C Diff toxin NEGATIVE (A) NEGATIVE Final   C Diff interpretation VALID  Final    Comment: Performed at [facility], [place] [address], [place], [place] [address]  SARS Coronavirus 2 (CEPHEID - Performed in [place] hospital lab), Hosp Order     Status: None   Collection Time: 06/23/18  8:35 PM   Specimen: Nasopharyngeal Swab  Result Value Ref Range Status   SARS Coronavirus 2 NEGATIVE NEGATIVE Final    Comment: (NOTE) If result  is NEGATIVE SARS-CoV-2 target nucleic acids are NOT DETECTED. The SARS-CoV-2 RNA is generally detectable in upper and lower  respiratory specimens during the acute phase of infection. The lowest  concentration of SARS-CoV-2 viral copies this assay can detect is 250  copies / mL. A negative result does not preclude SARS-CoV-2 infection  and should not be used as the sole basis for treatment or other  patient management decisions.  A negative result may occur with  improper specimen collection / handling, submission of specimen other  than nasopharyngeal swab, presence of viral mutation(s) within the  areas targeted by this assay, and inadequate number of viral copies  (<250 copies / mL). A negative result must be combined with clinical  observations, patient history, and epidemiological information. If result is POSITIVE SARS-CoV-2 target nucleic acids are DETECTED. The SARS-CoV-2 RNA is generally detectable in upper and lower  respiratory specimens dur ing the acute phase of  infection.  Positive  results are indicative of active infection with SARS-CoV-2.  Clinical  correlation with patient history and other diagnostic information is  necessary to determine patient infection status.  Positive results do  not rule out bacterial infection or co-infection with other viruses. If result is PRESUMPTIVE POSTIVE SARS-CoV-2 nucleic acids MAY BE PRESENT.   A presumptive positive result was obtained on the submitted specimen  and confirmed on repeat testing.  While 2019 novel coronavirus  (SARS-CoV-2) nucleic acids may be present in the submitted sample  additional confirmatory testing may be necessary for epidemiological  and / or clinical management purposes  to differentiate between  SARS-CoV-2 and other Sarbecovirus currently known to infect humans.  If clinically indicated additional testing with an alternate test  methodology [id number]) is advised. The SARS-CoV-2 RNA is generally  detectable in upper and lower respiratory sp ecimens during the acute  phase of infection. The expected result is Negative. Fact Sheet for Patients:  StrictlyIdeas.no Fact Sheet for Healthcare Providers: BankingDealers.co.za This test is not yet approved or cleared by the [place] FDA and has been authorized for detection and/or diagnosis of SARS-CoV-2 by FDA under an Emergency Use Authorization (EUA).  This EUA will remain in effect (meaning this test can be used) for the duration of the COVID-19 declaration under Section 564(b)(1) of the Act, 21 U.S.C. section 360bbb-3(b)(1), unless the authorization is terminated or revoked sooner. Performed at [facility], [place] [address] [place], [place] [address]          Radiology Studies: Ct Abdomen Pelvis Wo Contrast  Result Date: 06/23/2018 CLINICAL DATA:  Bloody stools. EXAM: CT ABDOMEN AND PELVIS WITHOUT CONTRAST TECHNIQUE: Multidetector CT imaging of the  abdomen and pelvis was performed following the standard protocol without IV contrast. COMPARISON:  CT dated 08/28/2016. FINDINGS: Lower chest: There is scarring versus atelectasis at the right lung base. Hepatobiliary: No focal liver abnormality is seen. No gallstones, gallbladder wall thickening, or biliary dilatation. Pancreas: Unremarkable. No pancreatic ductal dilatation or surrounding inflammatory changes. Spleen: Normal in size without focal abnormality. Adrenals/Urinary Tract: Adrenal glands are unremarkable. Kidneys are normal, without renal calculi, focal lesion, or hydronephrosis. Bladder is unremarkable. Stomach/Bowel: There is a large hiatal hernia. There is diffuse wall thickening of the transverse colon, splenic flexure, and descending colon. The appendix is unremarkable. There is no evidence of a small-bowel obstruction. Vascular/Lymphatic: Aortic atherosclerosis. No enlarged abdominal or pelvic lymph nodes. Reproductive: Status post hysterectomy. No adnexal masses. Other: No abdominal wall hernia or abnormality. No abdominopelvic ascites. Musculoskeletal: No acute  or significant osseous findings. There is a stable metallic foreign body at the level of the sacrum. There are degenerative changes throughout the visualized thoracolumbar spine. IMPRESSION: 1. Diffuse wall thickening of the transverse colon, splenic flexure, and descending colon is consistent with infectious or inflammatory colitis. In this location, ischemic colitis is also a possibility. 2. Large hiatal hernia. Electronically Signed   By: Constance Holster M.D.   On: 06/23/2018 22:44   Dg Chest 1 View  Result Date: 06/23/2018 CLINICAL DATA:  Shortness of breath EXAM: CHEST  1 VIEW COMPARISON:  04/13/2017 FINDINGS: Heart and mediastinal contours are within normal limits. No focal opacities or effusions. No acute bony abnormality. Old healed left rib fractures. IMPRESSION: No active cardiopulmonary disease. Electronically Signed   By:  Rolm Baptise M.D.   On: 06/23/2018 20:34   Dg Chest Port 1 View  Result Date: 06/24/2018 CLINICAL DATA:  Intermittent left-sided chest pain for 2 months. EXAM: PORTABLE CHEST 1 VIEW COMPARISON:  June 23, 2018 FINDINGS: The heart size and mediastinal contours are within normal limits. Both lungs are clear. The visualized skeletal structures are unremarkable. IMPRESSION: No active disease. Electronically Signed   By: Dorise Bullion III M.D   On: 06/24/2018 13:05        Scheduled Meds:  ketorolac  1 drop Right Eye QID   nicotine  14 mg Transdermal Daily   ofloxacin  1 drop Right Eye QID   pantoprazole  40 mg Oral BID   prednisoLONE acetate  1 drop Right Eye QID   Continuous Infusions:  sodium chloride 100 mL/hr at 06/24/18 1550   cefTRIAXone (ROCEPHIN)  IV 2 g (06/24/18 2316)   metronidazole 500 mg (06/25/18 0611)     LOS: 2 days    Time spent: 32 minutes    Cowan Pilar J [place] DO [facility] Pager [phone]  If 7PM-7AM, please contact night-coverage [email or web address] Password [email or web address] 06/25/2018, 12:43 PM

## 2018-06-25 NOTE — Progress Notes (Signed)
Initial Nutrition Assessment  RD working remotely.   DOCUMENTATION CODES:   Not applicable  INTERVENTION:  - will order Boost Breeze TID, each supplement provides 250 kcal and 9 grams of protein. - will order daily multivitamin with minerals. - continue to encourage PO intakes and advance diet as medically feasible.    NUTRITION DIAGNOSIS:   Inadequate protein intake related to other (see comment)(current diet order) as evidenced by other (comment)(CLD does not meet estimated protein need.).  GOAL:   Patient will meet greater than or equal to 90% of their needs  MONITOR:   PO intake, Supplement acceptance, Diet advancement, Labs, Weight trends  REASON FOR ASSESSMENT:   Malnutrition Screening Tool  ASSESSMENT:   63 year-old female with medical history significant for polysubstance abuse (cocaine, opioids), HTN, CKD stage 3, rheumatoid arthritis, tobacco abuse, questionable PE in 2017, spinal stenosis, and episode of ischemic colitis in 2018 thought to be 2/2 cocaine use. She presented to the ED with abdominal pain and rectal bleeding x5 days which has worsened over time and reportedly much worse on the day of admission (6/12). Abdominal pain has been worsen on the L than on the R; pain is constant and crampy in nature. Mild nausea, no vomiting. Patient is unsure what meds she takes, says she is given a pill pack. Stated she takes some sort of opioid, unsure what. Last cocaine use was 6/9. Colonoscopy performed in 2018 after similar presentation, findings suggestive of ischemic colitis, thought to be 2/2 cocaine use.  Diet advanced from NPO to CLD today at 7:30 AM and patient was able to consume 100% of breakfast without issue. Patient reports ongoing abdominal pain but that it has begun to improve since hospitalization. No N/V since admission.   Per chart review, current weight is 174 lb. Last recorded weight PTA was 04/24/17 when she weighed 167 lb. On 04/07/17 she had weighed 177  lb.   Per notes: colitis thought to be ischemic in setting of cocaine use, lower GIB, GI consulted and following, plan for CLD now and advancement of diet as tolerated, atypical chest pain--on telemetry, chronic pain syndrome, polysubstance abuse/use (cocain, tobacco, opiates).    Medications reviewed; 40 mg oral protonix BID, 40 mEq K-Dur x1 dose 6/14.  Labs reviewed; K: 3.3 mmol/l, Cl: 114 mmol/l, Ca: 8 mg/dl, Mg: 1.6 mg/dl.  IVF; NS @ 100 ml/hr.     NUTRITION - FOCUSED PHYSICAL EXAM:  unable to complete at this time.   Diet Order:   Diet Order            Diet clear liquid Room service appropriate? Yes; Fluid consistency: Thin  Diet effective now              EDUCATION NEEDS:   Not appropriate for education at this time  Skin:  Skin Assessment: Reviewed RN Assessment  Last BM:  6/13  Height:   Ht Readings from Last 1 Encounters:  06/24/18 5' 6.5" (1.689 m)    Weight:   Wt Readings from Last 1 Encounters:  06/24/18 78.9 kg    Ideal Body Weight:  60.2 kg  BMI:  Body mass index is 27.65 kg/m.  Estimated Nutritional Needs:   Kcal:  1970-2130 kcal  Protein:  90-100 grams  Fluid:  >/= 2 L/day     Jarome Matin, MS, RD, LDN, [facility] Clinical Dietitian Pager # [phone] After hours/weekend pager # [phone]

## 2018-06-25 NOTE — Progress Notes (Signed)
Cross cover [facility] Subjective: Since I last evaluated the patient, she seems to be doing somewhat better today.  She denies having any nausea vomiting but has had some mild abdominal pain.  She had one small volume bowel movement this morning with little bit of blood in it.  Objective: Vital signs in last 24 hours: Temp:  [97.8 F (36.6 C)-97.9 F (36.6 C)] 97.8 F (36.6 C) (06/14 0452) Pulse Rate:  [66-79] 66 (06/14 0452) Resp:  [16-20] 16 (06/14 0452) BP: (130-150)/(71-77) 150/77 (06/14 0452) SpO2:  [97 %-100 %] 97 % (06/14 0452) Last BM Date: 06/24/18  Intake/Output from previous day: 06/13 0701 - 06/14 0700 In: 3419.8 [I.V.:2095; IV Piggyback:1324.8] Out: 400 [Urine:400] Intake/Output this shift: Total I/O In: 287 [P.O.:237; IV Piggyback:50] Out: -   General appearance: alert, cooperative, appears stated age, fatigued and no distress Resp: clear to auscultation bilaterally Cardio: regular rate and rhythm, S1, S2 normal, no murmur, click, rub or gallop GI: soft, mild diffuse tenderness with hypoactive bowel sounds; no masses,  no organomegaly Extremities: extremities normal, atraumatic, no cyanosis or edema  Lab Results: Recent Labs    06/23/18 1957 06/24/18 0527 06/25/18 0551  WBC 10.7* 14.5* 12.4*  HGB 13.7 12.2 11.6*  HCT 42.4 38.8 36.5  PLT 261 269 236   BMET Recent Labs    06/23/18 1957 06/24/18 0527 06/25/18 0551  NA 139 139 141  K 3.5 3.6 3.3*  CL 111 116* 114*  CO2 17* 17* 19*  GLUCOSE 107* 101* 73  BUN 21 14 8   CREATININE 1.17* 0.86 0.76  CALCIUM 8.4* 7.7* 8.0*   LFT Recent Labs    06/23/18 1957  PROT 6.9  ALBUMIN 3.3*  AST 13*  ALT 10  ALKPHOS 101  BILITOT 0.5   PT/INR Recent Labs    06/23/18 2017  LABPROT 12.2  INR 0.9   C-Diff Recent Labs    06/23/18 2035  CDIFFTOX NEGATIVE*   Studies/Results: Ct Abdomen Pelvis Wo Contrast  Result Date: 06/23/2018 CLINICAL DATA:  Bloody stools. EXAM: CT ABDOMEN AND PELVIS WITHOUT  CONTRAST TECHNIQUE: Multidetector CT imaging of the abdomen and pelvis was performed following the standard protocol without IV contrast. COMPARISON:  CT dated 08/28/2016. FINDINGS: Lower chest: There is scarring versus atelectasis at the right lung base. Hepatobiliary: No focal liver abnormality is seen. No gallstones, gallbladder wall thickening, or biliary dilatation. Pancreas: Unremarkable. No pancreatic ductal dilatation or surrounding inflammatory changes. Spleen: Normal in size without focal abnormality. Adrenals/Urinary Tract: Adrenal glands are unremarkable. Kidneys are normal, without renal calculi, focal lesion, or hydronephrosis. Bladder is unremarkable. Stomach/Bowel: There is a large hiatal hernia. There is diffuse wall thickening of the transverse colon, splenic flexure, and descending colon. The appendix is unremarkable. There is no evidence of a small-bowel obstruction. Vascular/Lymphatic: Aortic atherosclerosis. No enlarged abdominal or pelvic lymph nodes. Reproductive: Status post hysterectomy. No adnexal masses. Other: No abdominal wall hernia or abnormality. No abdominopelvic ascites. Musculoskeletal: No acute or significant osseous findings. There is a stable metallic foreign body at the level of the sacrum. There are degenerative changes throughout the visualized thoracolumbar spine. IMPRESSION: 1. Diffuse wall thickening of the transverse colon, splenic flexure, and descending colon is consistent with infectious or inflammatory colitis. In this location, ischemic colitis is also a possibility. 2. Large hiatal hernia. Electronically Signed   By: Constance Holster M.D.   On: 06/23/2018 22:44   Dg Chest 1 View  Result Date: 06/23/2018 CLINICAL DATA:  Shortness of breath EXAM: CHEST  1 VIEW COMPARISON:  04/13/2017 FINDINGS: Heart and mediastinal contours are within normal limits. No focal opacities or effusions. No acute bony abnormality. Old healed left rib fractures. IMPRESSION: No active  cardiopulmonary disease. Electronically Signed   By: Rolm Baptise M.D.   On: 06/23/2018 20:34   Dg Chest Port 1 View  Result Date: 06/24/2018 CLINICAL DATA:  Intermittent left-sided chest pain for 2 months. EXAM: PORTABLE CHEST 1 VIEW COMPARISON:  June 23, 2018 FINDINGS: The heart size and mediastinal contours are within normal limits. Both lungs are clear. The visualized skeletal structures are unremarkable. IMPRESSION: No active disease. Electronically Signed   By: Dorise Bullion III M.D   On: 06/24/2018 13:05   Medications: I have reviewed the patient's current medications.  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 2) Essential hypertension. 3) History of rheumatoid arthritis. 4) Chronic pain syndrome  LOS: 2 days   Maycol Hoying 06/25/2018, 9:24 AM

## 2018-06-26 LAB — CBC
HCT: 33.6 % — ABNORMAL LOW (ref 36.0–46.0)
Hemoglobin: 10.7 g/dL — ABNORMAL LOW (ref 12.0–15.0)
MCH: 29.2 pg (ref 26.0–34.0)
MCHC: 31.8 g/dL (ref 30.0–36.0)
MCV: 91.6 fL (ref 80.0–100.0)
Platelets: 262 K/uL (ref 150–400)
RBC: 3.67 MIL/uL — ABNORMAL LOW (ref 3.87–5.11)
RDW: 14.9 % (ref 11.5–15.5)
WBC: 8.7 K/uL (ref 4.0–10.5)
nRBC: 0 % (ref 0.0–0.2)

## 2018-06-26 LAB — HEPATITIS B SURFACE ANTIGEN: Hepatitis B Surface Ag: NEGATIVE

## 2018-06-26 LAB — BASIC METABOLIC PANEL
Anion gap: 7 (ref 5–15)
BUN: 5 mg/dL — ABNORMAL LOW (ref 8–23)
CO2: 20 mmol/L — ABNORMAL LOW (ref 22–32)
Calcium: 8.1 mg/dL — ABNORMAL LOW (ref 8.9–10.3)
Chloride: 112 mmol/L — ABNORMAL HIGH (ref 98–111)
Creatinine, Ser: 0.79 mg/dL (ref 0.44–1.00)
GFR calc Af Amer: >60 mL/min (ref >60)
GFR calc non Af Amer: >60 mL/min (ref >60)
Glucose, Bld: 96 mg/dL (ref 70–99)
Potassium: 3.4 mmol/L — ABNORMAL LOW (ref 3.5–5.1)
Sodium: 139 mmol/L (ref 135–145)

## 2018-06-26 LAB — HCV INTERPRETATION

## 2018-06-26 LAB — HCV AB W REFLEX TO QUANT PCR: HCV Ab: 0.1 s/co ratio (ref 0.0–0.9)

## 2018-06-26 LAB — MAGNESIUM: Magnesium: 1.7 mg/dL (ref 1.7–2.4)

## 2018-06-26 MED ORDER — POTASSIUM CHLORIDE CRYS ER 20 MEQ PO TBCR
40.0000 meq | EXTENDED_RELEASE_TABLET | Freq: Once | ORAL | Status: AC
  Administered 2018-06-26: 40 meq via ORAL
  Filled 2018-06-26: qty 2

## 2018-06-26 MED ORDER — AMLODIPINE BESYLATE 5 MG PO TABS
5.0000 mg | ORAL_TABLET | Freq: Every day | ORAL | Status: DC
  Administered 2018-06-26 – 2018-06-29 (×4): 5 mg via ORAL
  Filled 2018-06-26 (×4): qty 1

## 2018-06-26 MED ORDER — MAGNESIUM SULFATE 2 GM/50ML IV SOLN
2.0000 g | Freq: Once | INTRAVENOUS | Status: AC
  Administered 2018-06-26: 2 g via INTRAVENOUS
  Filled 2018-06-26: qty 50

## 2018-06-26 NOTE — Progress Notes (Signed)
PROGRESS NOTE    [name]  VQQ:[id number] DOB: 1955/03/31 DOA: 06/23/2018 PCP: Nolene Ebbs, MD    Brief Narrative:   [name] is a 63 y.o. female with medical history significant for polysubstance abuse (cocaine, opioids), htn, ckd3 rheumatoid arthritis, ckd3, tobacco abuse, questionable PE 2017 (indeterminate vq scan, doesn't appear she is currently anticoagulated), spinal stenosis, and episode of ischemic colitis in 2018 thought to be 2/2 cocaine use, presents with above.  Patient reports about 5 days of symptoms, gradually worsening and now much worse today. Has diffuse abdominal pain more on the left than on the right. Constant and also crampy. Was initially constipated and then a few days ago began to pass small amounts of bright red blood when attempted to defecate. Today had increased abdominal pain and had a large volume bowel movement that contained what she describes as a significant amount of bright red blood. Mild nausea, no vomiting. Patient is unsure what meds she takes, says she is given a pill pack. Says she takes some sort of opioid, unsure what. Last cocaine use 3 days ago. No fevers. No recent travel or sick contacts.   Colonoscopy performed in 2018 after similar presentation, findings suggestive of ischemic colitis, thought to be 2/2 cocaine use.   ED Course: labs, imaging, antibiotics, fluids, pain control   Assessment & Plan:   Principal Problem:   Colitis Active Problems:   Rheumatoid arthritis ([place])   HTN (hypertension), benign   Cocaine abuse ([facility]CC)   Tobacco abuse   COPD (chronic obstructive pulmonary disease) (HCC)   Hematochezia  Colitis, suspect ischemic in the setting of cocaine abuse Lower GI bleed Patient presenting with 5 days of gradually worsening cramping diffuse abdominal pain now associated with bright red blood per rectum.  CT abdomen/pelvis notable for transverse and descending colon wall thickening consistent with infectious  versus inflammatory versus ischemic colitis.  History of similar episode August 2018 with colonoscopy and pathology notable for focal active colitis and hyperemic changes and negative for malignancy.  Continues with active cocaine use.  C. difficile negative.  SARS-CoV-2 negative.  ESR elevated at 41 and CRP 17.6.  FOBT positive.  Urinalysis unrevealing and urine hCG negative.  Evaluated by gastroenterology, Dr. Collene Mares; CT findings suggestive of ischemic colitis especially in the setting of continued cocaine abuse and no further indication of repeat colonoscopy at this time as this was recently done in 2018 under similar circumstances. --Gastroenterology, Dr. Collene Mares consulted; appreciate assistance --WBC 10.7-->14.5-->12.4-->8.7; afebrile --Hgb 12.2-->11.6-->10.7, stable --Discontinue antibiotics today --GI PCR panel: negative --Soft diet --Supportive care  Atypical chest pain Patient describing throbbing, constant supraclavicular pain with radiation to her left arm.  EKG unrevealing with notable for normal sinus rhythm without any concerning ST elevation/depression.  Troponin <0.03, and chest x-ray with no acute cardiopulmonary process.  Patient underwent nuclear medicine stress test April 2019 with EF 61% normal LV wall motion and no reversible ischemia or infarction.  This is likely secondary to her high anxiety state as well with continued cocaine abuse. --Continue to monitor on telemetry --Supportive measures  Essential hypertension Patient reports history of hypertension on medication at home.  Cannot recall as she gets them in blister packs from her pharmacy.  Pharmacy current closed until Monday morning.  Only well controlled 109/65.  Has been as low as 84/62. --BP now up to 150/79 --Continue to monitor blood pressure closely, no need for antihypertensives at this time  Hx rheumatoid arthritis Patient denies any DMARD medication. Has been on  prednisone 5 mg p.o. daily in the past.  Unable to  clearly define what medications she is on currently.  Her pharmacy is currently closed, will attempt to contact pharmacy monday for updated medication list.  Chronic pain syndrome Patient previously followed by the pain clinic, but was discharged from their clinic due to losing her pain medicine, and Suboxone levels previously undetectable, and history of persistent cocaine abuse. --Discontinue Dilaudid today --Hydrocodone/acetaminophen 5 mg / 325 mg p.o. every 6 hours as needed  Polysubstance abuse with tobacco, opiates, cocaine UDS positive for cocaine.  Patient endorses cocaine use last 3 days ago.  Discussed with patient need for complete cessation as this is likely cause of her presenting symptoms.  GERD: Continue PPI twice daily   DVT prophylaxis: SCDs, chemical DVT prophylaxis contraindicated in acute GI bleed Code Status: Full code Family Communication: None Disposition Plan: PT recommends SNF, but does not meet standards given positive cocaine abuse on admission.  Continue PT efforts while inpatient.  Hopeful discharge home soon.   Consultants:   Gastroenterology, Dr. Collene Mares  Procedures:   none  Antimicrobials:   Ceftriaxone 6/12 - 6/15  Flagyl 6/12 - 6/15   Subjective: Patient resting comfortably in bed.  States abdomen feels full.  Continues reports small amounts of blood in her stool.  Tolerating her diet.  I waited by PT with recommendations of SNF; although will not meet requirements given she was positive for cocaine on this admission.  Patient also declines home health given that she lives with a roommate and she feels "embarrassed". Hemoglobin remains stable.  Continues with weakness and fatigue.  Patient with no other complaints or concerns at this time. Denies headache, no fever/chills/night sweats, no palpitations, no nausea/vomiting/diarrhea, no cough/congestion, no paresthesias.  No other acute events overnight per nursing staff.  Objective: Vitals:    06/25/18 2133 06/26/18 0555 06/26/18 1100 06/26/18 1211  BP: (!) 157/83 (!) 150/79 (!) 158/84   Pulse: 69 75    Resp: 15 16 18    Temp: 98.8 F (37.1 C) 98.4 F (36.9 C)    TempSrc: Oral Oral    SpO2: 100% 98% 100% 97%  Weight:      Height:        Intake/Output Summary (Last 24 hours) at 06/26/2018 1226 Last data filed at 06/26/2018 1000 Gross per 24 hour  Intake 4047.51 ml  Output 3050 ml  Net 997.51 ml   Filed Weights   06/24/18 0515  Weight: 78.9 kg    Examination:  General exam: Appears calm and comfortable  Respiratory system: Clear to auscultation. Respiratory effort normal. Cardiovascular system: S1 & S2 heard, RRR. No JVD, murmurs, rubs, gallops or clicks. No pedal edema. Gastrointestinal system: Abdomen is protuberant, nondistended, soft, mild left lower quadrant tenderness,. No organomegaly or masses felt. Normal bowel sounds heard. Central nervous system: Alert and oriented. No focal neurological deficits. Extremities: Symmetric 5 x 5 power. Skin: No rashes, lesions or ulcers Psychiatry: Judgement and insight appear normal. Mood & affect appropriate.     Data Reviewed: I have personally reviewed following labs and imaging studies  CBC: Recent Labs  Lab 06/23/18 1957 06/24/18 0527 06/25/18 0551 06/26/18 0541  WBC 10.7* 14.5* 12.4* 8.7  NEUTROABS 7.9*  --   --   --   HGB 13.7 12.2 11.6* 10.7*  HCT 42.4 38.8 36.5 33.6*  MCV 91.8 93.3 93.1 91.6  PLT 261 269 236 056   Basic Metabolic Panel: Recent Labs  Lab 06/23/18 1957 06/24/18 0527  06/25/18 0551 06/26/18 0541  NA 139 139 141 139  K 3.5 3.6 3.3* 3.4*  CL 111 116* 114* 112*  CO2 17* 17* 19* 20*  GLUCOSE 107* 101* 73 96  BUN 21 14 8  5*  CREATININE 1.17* 0.86 0.76 0.79  CALCIUM 8.4* 7.7* 8.0* 8.1*  MG  --   --  1.6* 1.7   GFR: Estimated Creatinine Clearance: 76.2 mL/min (by C-G formula based on SCr of 0.79 mg/dL). Liver Function Tests: Recent Labs  Lab 06/23/18 1957  AST 13*  ALT 10   ALKPHOS 101  BILITOT 0.5  PROT 6.9  ALBUMIN 3.3*   Recent Labs  Lab 06/23/18 2017  LIPASE 22   No results for input(s): AMMONIA in the last 168 hours. Coagulation Profile: Recent Labs  Lab 06/23/18 2017  INR 0.9   Cardiac Enzymes: Recent Labs  Lab 06/24/18 0803 06/24/18 1326 06/24/18 1947  TROPONINI <0.03 <0.03 <0.03   BNP (last 3 results) No results for input(s): PROBNP in the last 8760 hours. HbA1C: No results for input(s): HGBA1C in the last 72 hours. CBG: No results for input(s): GLUCAP in the last 168 hours. Lipid Profile: No results for input(s): CHOL, HDL, LDLCALC, TRIG, CHOLHDL, LDLDIRECT in the last 72 hours. Thyroid Function Tests: No results for input(s): TSH, T4TOTAL, FREET4, T3FREE, THYROIDAB in the last 72 hours. Anemia Panel: No results for input(s): VITAMINB12, FOLATE, FERRITIN, TIBC, IRON, RETICCTPCT in the last 72 hours. Sepsis Labs: Recent Labs  Lab 06/23/18 2017  LATICACIDVEN 1.5    Recent Results (from the past 240 hour(s))  Gastrointestinal Panel by PCR , Stool     Status: None   Collection Time: 06/23/18  8:22 PM   Specimen: STOOL  Result Value Ref Range Status   Campylobacter species NOT DETECTED NOT DETECTED Final   Plesimonas shigelloides NOT DETECTED NOT DETECTED Final   Salmonella species NOT DETECTED NOT DETECTED Final   Yersinia enterocolitica NOT DETECTED NOT DETECTED Final   Vibrio species NOT DETECTED NOT DETECTED Final   Vibrio cholerae NOT DETECTED NOT DETECTED Final   Enteroaggregative E coli (EAEC) NOT DETECTED NOT DETECTED Final   Enteropathogenic E coli (EPEC) NOT DETECTED NOT DETECTED Final   Enterotoxigenic E coli (ETEC) NOT DETECTED NOT DETECTED Final   Shiga like toxin producing E coli (STEC) NOT DETECTED NOT DETECTED Final   Shigella/Enteroinvasive E coli (EIEC) NOT DETECTED NOT DETECTED Final   Cryptosporidium NOT DETECTED NOT DETECTED Final   Cyclospora cayetanensis NOT DETECTED NOT DETECTED Final   Entamoeba  histolytica NOT DETECTED NOT DETECTED Final   Giardia lamblia NOT DETECTED NOT DETECTED Final   Adenovirus F40/41 NOT DETECTED NOT DETECTED Final   Astrovirus NOT DETECTED NOT DETECTED Final   Norovirus GI/GII NOT DETECTED NOT DETECTED Final   Rotavirus A NOT DETECTED NOT DETECTED Final   Sapovirus (I, II, IV, and V) NOT DETECTED NOT DETECTED Final    Comment: Performed at [facility], [address] [place], [place] [address]  C Difficile Quick Screen w PCR reflex     Status: Abnormal   Collection Time: 06/23/18  8:35 PM   Specimen: STOOL  Result Value Ref Range Status   C Diff antigen NEGATIVE (A) NEGATIVE Final   C Diff toxin NEGATIVE (A) NEGATIVE Final   C Diff interpretation VALID  Final    Comment: Performed at [facility], [place] [address], [place], [place] [address]  SARS Coronavirus 2 (CEPHEID - Performed in [facility] hospital lab),  Hosp Order     Status: None   Collection Time: 06/23/18  8:35 PM   Specimen: Nasopharyngeal Swab  Result Value Ref Range Status   SARS Coronavirus 2 NEGATIVE NEGATIVE Final    Comment: (NOTE) If result is NEGATIVE SARS-CoV-2 target nucleic acids are NOT DETECTED. The SARS-CoV-2 RNA is generally detectable in upper and lower  respiratory specimens during the acute phase of infection. The lowest  concentration of SARS-CoV-2 viral copies this assay can detect is 250  copies / mL. A negative result does not preclude SARS-CoV-2 infection  and should not be used as the sole basis for treatment or other  patient management decisions.  A negative result may occur with  improper specimen collection / handling, submission of specimen other  than nasopharyngeal swab, presence of viral mutation(s) within the  areas targeted by this assay, and inadequate number of viral copies  (<250 copies / mL). A negative result must be combined with clinical  observations, patient history, and epidemiological information. If result is  POSITIVE SARS-CoV-2 target nucleic acids are DETECTED. The SARS-CoV-2 RNA is generally detectable in upper and lower  respiratory specimens dur ing the acute phase of infection.  Positive  results are indicative of active infection with SARS-CoV-2.  Clinical  correlation with patient history and other diagnostic information is  necessary to determine patient infection status.  Positive results do  not rule out bacterial infection or co-infection with other viruses. If result is PRESUMPTIVE POSTIVE SARS-CoV-2 nucleic acids MAY BE PRESENT.   A presumptive positive result was obtained on the submitted specimen  and confirmed on repeat testing.  While 2019 novel coronavirus  (SARS-CoV-2) nucleic acids may be present in the submitted sample  additional confirmatory testing may be necessary for epidemiological  and / or clinical management purposes  to differentiate between  SARS-CoV-2 and other Sarbecovirus currently known to infect humans.  If clinically indicated additional testing with an alternate test  methodology [phone]) is advised. The SARS-CoV-2 RNA is generally  detectable in upper and lower respiratory sp ecimens during the acute  phase of infection. The expected result is Negative. Fact Sheet for Patients:  StrictlyIdeas.no Fact Sheet for Healthcare Providers: BankingDealers.co.za This test is not yet approved or cleared by the [place] FDA and has been authorized for detection and/or diagnosis of SARS-CoV-2 by FDA under an Emergency Use Authorization (EUA).  This EUA will remain in effect (meaning this test can be used) for the duration of the COVID-19 declaration under Section 564(b)(1) of the Act, 21 U.S.C. section 360bbb-3(b)(1), unless the authorization is terminated or revoked sooner. Performed at [facility], [place] [address], [place], [place] [address]          Radiology Studies: No results  found.      Scheduled Meds: . feeding supplement  1 Container Oral TID BM  . ketorolac  1 drop Right Eye QID  . multivitamin with minerals  1 tablet Oral Daily  . nicotine  14 mg Transdermal Daily  . ofloxacin  1 drop Right Eye QID  . pantoprazole  40 mg Oral BID  . prednisoLONE acetate  1 drop Right Eye QID   Continuous Infusions: . sodium chloride 100 mL/hr at 06/26/18 0131  . cefTRIAXone (ROCEPHIN)  IV 2 g (06/25/18 2110)  . metronidazole 500 mg (06/26/18 0526)     LOS: 3 days    Time spent: 32 minutes    Rosalynd Mcwright J [place] DO [facility] Pager [phone]  If  7PM-7AM, please contact night-coverage [email or web address] Password [facility] 06/26/2018, 12:26 PM

## 2018-06-26 NOTE — TOC Initial Note (Signed)
Transition of Care [facility]) - Initial/Assessment Note    Patient Details  Name: [name] MRN: [id number] Date of Birth: [birth year]  Transition of [facility]) CM/SW Contact:    Lia Hopping, [place] Phone Number: 06/26/2018, 4:08 PM  Clinical Narrative:  .       Admitted for: Patient presenting with 5 days of gradually worsening cramping diffuse abdominal pain now associated with bright red blood per rectum. PT has generalized weakness. PT recommends SNF placement.            CSW met with the patient at bedside to discuss discharge planning to SNF. CSW informed patient a positive urine drug screen may be a barrier to placement. Patient declined home health services because he currently lives with a friend and does not feel comfortable allowing a home health company to come out to their home. Patient reports she has adult children but they cannot take her in.   Patient reports she is actively seeking substance use treatment, patient unable to recall the name of the program.   4:30om Patient she does not consent to signing over her check for SNF placement. Patient reports she needs her income to pay for groceries, etc.    Barriers to Discharge: Active Substance Use, No bed offers, patient does not consent to signing over her check for placement. Declines Home Health.   Patient Goals and CMS Choice Patient states their goals for this hospitalization and ongoing recovery are:To get walk independently.      Expected Discharge Plan and Services   In-house Referral: Clinical Social Work     Living arrangements for the past 2 months: Apartment                                      Prior Living Arrangements/Services Living arrangements for the past 2 months: Apartment Lives with:: Friends Patient language and need for interpreter reviewed:: No Do you feel safe going back to the place where you live?: Yes      Need for Family Participation in Patient Care: No (Comment) Care  giver support system in place?: Yes (comment)   Criminal Activity/Legal Involvement Pertinent to Current Situation/Hospitalization: No - Comment as needed  Activities of Daily Living Home Assistive Devices/Equipment: Walker (specify type)(rolling walker with chair) ADL Screening (condition at time of admission) Patient's cognitive ability adequate to safely complete daily activities?: Yes Is the patient deaf or have difficulty hearing?: No Does the patient have difficulty seeing, even when wearing glasses/contacts?: Yes Does the patient have difficulty concentrating, remembering, or making decisions?: Yes Patient able to express need for assistance with ADLs?: No Does the patient have difficulty dressing or bathing?: No Independently performs ADLs?: Yes (appropriate for developmental age) Does the patient have difficulty walking or climbing stairs?: Yes Weakness of Legs: Both Weakness of Arms/Hands: Both  Permission Sought/Granted Permission sought to share information with : Case Manager                Emotional Assessment Appearance:: Appears stated age   Affect (typically observed): Accepting, Calm Orientation: : Oriented to Self, Oriented to Place, Oriented to  Time, Oriented to Situation Alcohol / Substance Use: Not Applicable Psych Involvement: No (comment)  Admission diagnosis:  Colitis [K52.9] Rectal bleeding [K62.5] Dyspnea [R06.00] Polysubstance abuse ([place]) [F19.10] Patient Active Problem List   Diagnosis Date Noted  . Colitis 06/23/2018  . Coronary artery calcification 02/23/2017  .  Closed nondisplaced fracture of lateral malleolus of right fibula 10/28/2016  . Noninfectious gastroenteritis   . Hematochezia 09/01/2016  . Acute diarrhea 08/28/2016  . Hydronephrosis, right 06/29/2015  . Hyperkalemia 06/29/2015  . HCAP (healthcare-associated pneumonia) 06/29/2015  . Peripheral neuropathy 06/29/2015  . Candida infection, oral 06/28/2015  . Anemia due to other  cause 06/10/2015  . Hypoalbuminemia due to protein-calorie malnutrition ([place]) 06/10/2015  . Ankle fracture, right 06/03/2015  . Severe sepsis ([place]) 05/29/2015  . Spinal stenosis in cervical region 05/12/2015  . S/P cervical spinal fusion 04/17/2015  . Acute cystitis without hematuria   . Vomiting   . Constipation 02/17/2015  . CAP (community acquired pneumonia) 02/15/2015  . Encephalopathy, metabolic 76/80/8811  . Cocaine abuse with cocaine-induced disorder ([place]) 02/14/2015  . Sepsis ([place]) 10/28/2014  . SIRS (systemic inflammatory response syndrome) ([place]) 10/28/2014  . Sore throat 10/28/2014  . Acute encephalopathy 10/28/2014  . Metabolic acidosis   . Leukocytosis   . Hypotension 09/10/2014  . Dehydration 09/10/2014  . Chest pain at rest 09/09/2014  . Tobacco abuse 09/09/2014  . HLD (hyperlipidemia) 09/09/2014  . COPD (chronic obstructive pulmonary disease) ([place]) 09/09/2014  . GERD (gastroesophageal reflux disease) 09/09/2014  . Chest pain 08/20/2014  . Nausea & vomiting 08/20/2014  . Acute renal failure superimposed on stage 3 chronic kidney disease ([place]) 08/20/2014  . Lower urinary tract infectious disease 08/20/2014  . Pain in the chest   . Pain 02/11/2013  . Cocaine abuse ([place]) 02/11/2013  . Rheumatoid arthritis flare ([place]) 02/11/2013  . Acute renal failure ([place]) 02/11/2013  . AKI (acute kidney injury) ([place]) 02/11/2013  . Hiatal hernia with gastroesophageal reflux 10/11/2012  . Abdominal mass 10/08/2012  . Knee pain 10/08/2012  . HTN (hypertension), benign 10/08/2012  . Pancreatic mass 10/07/2012  . Abdominal pain 10/07/2012  . Rheumatoid arthritis ([place])    PCP:  Nolene Ebbs, MD Pharmacy:   [place], [place] - [address] [place] [address] Phone: [phone] Fax: [phone]     Social Determinants of Health (SDOH) Interventions    Readmission Risk Interventions No flowsheet data found.

## 2018-06-27 LAB — BASIC METABOLIC PANEL
Anion gap: 7 (ref 5–15)
BUN: <5 mg/dL — ABNORMAL LOW (ref 8–23)
CO2: 23 mmol/L (ref 22–32)
Calcium: 8.3 mg/dL — ABNORMAL LOW (ref 8.9–10.3)
Chloride: 108 mmol/L (ref 98–111)
Creatinine, Ser: 0.67 mg/dL (ref 0.44–1.00)
GFR calc Af Amer: >60 mL/min (ref >60)
GFR calc non Af Amer: >60 mL/min (ref >60)
Glucose, Bld: 96 mg/dL (ref 70–99)
Potassium: 3.5 mmol/L (ref 3.5–5.1)
Sodium: 138 mmol/L (ref 135–145)

## 2018-06-27 LAB — CBC
HCT: 35 % — ABNORMAL LOW (ref 36.0–46.0)
Hemoglobin: 11.9 g/dL — ABNORMAL LOW (ref 12.0–15.0)
MCH: 30.2 pg (ref 26.0–34.0)
MCHC: 34 g/dL (ref 30.0–36.0)
MCV: 88.8 fL (ref 80.0–100.0)
Platelets: 275 10*3/uL (ref 150–400)
RBC: 3.94 MIL/uL (ref 3.87–5.11)
RDW: 14.8 % (ref 11.5–15.5)
WBC: 6.7 10*3/uL (ref 4.0–10.5)
nRBC: 0 % (ref 0.0–0.2)

## 2018-06-27 LAB — MAGNESIUM: Magnesium: 1.8 mg/dL (ref 1.7–2.4)

## 2018-06-27 MED ORDER — POTASSIUM CHLORIDE CRYS ER 20 MEQ PO TBCR
40.0000 meq | EXTENDED_RELEASE_TABLET | Freq: Once | ORAL | Status: AC
  Administered 2018-06-27: 40 meq via ORAL
  Filled 2018-06-27: qty 2

## 2018-06-27 MED ORDER — POLYETHYLENE GLYCOL 3350 17 G PO PACK
17.0000 g | PACK | Freq: Every day | ORAL | Status: DC | PRN
  Administered 2018-06-27 – 2018-06-29 (×3): 17 g via ORAL
  Filled 2018-06-27 (×3): qty 1

## 2018-06-27 NOTE — Progress Notes (Signed)
PROGRESS NOTE    [name]  AUQ:[id number] DOB: 02/05/1955 DOA: 06/23/2018 PCP: Nolene Ebbs, MD    Brief Narrative:   [name] is a 63 y.o. female with medical history significant for polysubstance abuse (cocaine, opioids), htn, ckd3 rheumatoid arthritis, ckd3, tobacco abuse, questionable PE 2017 (indeterminate vq scan, doesn't appear she is currently anticoagulated), spinal stenosis, and episode of ischemic colitis in 2018 thought to be 2/2 cocaine use, presents with above.  Patient reports about 5 days of symptoms, gradually worsening and now much worse today. Has diffuse abdominal pain more on the left than on the right. Constant and also crampy. Was initially constipated and then a few days ago began to pass small amounts of bright red blood when attempted to defecate. Today had increased abdominal pain and had a large volume bowel movement that contained what she describes as a significant amount of bright red blood. Mild nausea, no vomiting. Patient is unsure what meds she takes, says she is given a pill pack. Says she takes some sort of opioid, unsure what. Last cocaine use 3 days ago. No fevers. No recent travel or sick contacts.   Colonoscopy performed in 2018 after similar presentation, findings suggestive of ischemic colitis, thought to be 2/2 cocaine use.   ED Course: labs, imaging, antibiotics, fluids, pain control   Assessment & Plan:   Principal Problem:   Colitis Active Problems:   Rheumatoid arthritis ([place])   HTN (hypertension), benign   Cocaine abuse (H[facility])   Tobacco abuse   COPD (chronic obstructive pulmonary disease) (HCC)   Hematochezia  Colitis, suspect ischemic in the setting of cocaine abuse Lower GI bleed Patient presenting with 5 days of gradually worsening cramping diffuse abdominal pain now associated with bright red blood per rectum.  CT abdomen/pelvis notable for transverse and descending colon wall thickening consistent with infectious  versus inflammatory versus ischemic colitis.  History of similar episode August 2018 with colonoscopy and pathology notable for focal active colitis and hyperemic changes and negative for malignancy.  Continues with active cocaine use.  C. difficile negative.  SARS-CoV-2 negative.  ESR elevated at 41 and CRP 17.6.  FOBT positive.  Urinalysis unrevealing and urine hCG negative.  Evaluated by gastroenterology, Dr. Collene Mares; CT findings suggestive of ischemic colitis especially in the setting of continued cocaine abuse and no further indication of repeat colonoscopy at this time as this was recently done in 2018 under similar circumstances. --Gastroenterology, Dr. Collene Mares consulted; appreciate assistance --WBC 10.7-->14.5-->12.4-->8.7-->6.7; afebrile --Hgb 12.2-->11.6-->10.7-->11.9, stable --Discontinued antibiotics on 06/26/2018 --GI PCR panel: negative --Soft diet --Supportive care  Atypical chest pain Patient describing throbbing, constant supraclavicular pain with radiation to her left arm.  EKG unrevealing with notable for normal sinus rhythm without any concerning ST elevation/depression.  Troponin <0.03, and chest x-ray with no acute cardiopulmonary process.  Patient underwent nuclear medicine stress test April 2019 with EF 61% normal LV wall motion and no reversible ischemia or infarction.  This is likely secondary to her high anxiety state as well with continued cocaine abuse. --Continue to monitor on telemetry --Supportive measures  Essential hypertension Patient reports history of hypertension on medication at home.  Cannot recall as she gets them in blister packs from her pharmacy.   --Restarted home amlodipine --Continue to monitor blood pressure closely  Hx rheumatoid arthritis Patient denies any DMARD medication. Has been on prednisone 5 mg p.o. daily in the past.  Unable to clearly define what medications she is on currently.  --Continue outpatient follow-up  Chronic  pain syndrome  Patient previously followed by the pain clinic, but was discharged from their clinic due to losing her pain medicine, and Suboxone levels previously undetectable, and history of persistent cocaine abuse. --Discontinued Dilaudid on 06/26/2018 --Hydrocodone/acetaminophen 5 mg / 325 mg p.o. every 6 hours as needed  Polysubstance abuse with tobacco, opiates, cocaine UDS positive for cocaine.  Patient endorses cocaine use last 3 days ago.  Discussed with patient need for complete cessation as this is likely cause of her presenting symptoms.  GERD: Continue PPI twice daily  Weakness/debility Not qualify for rehab placement per case management due to her active cocaine use.  Patient declines home health therapy. --Continue PT efforts while inpatient, hopeful for discharge home soon   DVT prophylaxis: SCDs, chemical DVT prophylaxis contraindicated in acute GI bleed Code Status: Full code Family Communication: None Disposition Plan: PT recommends SNF, but does not meet standards given positive cocaine abuse on admission.  Continue PT efforts while inpatient.  Hopeful discharge home soon.   Consultants:   Gastroenterology, Dr. Collene Mares  Procedures:   none  Antimicrobials:   Ceftriaxone 6/12 - 6/15  Flagyl 6/12 - 6/15   Subjective: Patient resting comfortably in bed.  Tolerating diet.  Continues to feel weak and fatigued.  Hemoglobin stable. Patient with no other complaints or concerns at this time. Denies headache, no fever/chills/night sweats, no palpitations, no nausea/vomiting/diarrhea, no cough/congestion, no paresthesias.  No other acute events overnight per nursing staff.  Objective: Vitals:   06/26/18 1719 06/26/18 2055 06/26/18 2056 06/27/18 0458  BP: (!) 157/82 (!) 165/90 (!) 161/89 (!) 156/81  Pulse:  71 70 66  Resp:  20  20  Temp:  98.7 F (37.1 C)  98 F (36.7 C)  TempSrc:  Oral  Oral  SpO2:  100% 100% 96%  Weight:      Height:        Intake/Output Summary (Last 24  hours) at 06/27/2018 1316 Last data filed at 06/27/2018 0900 Gross per 24 hour  Intake 1422.46 ml  Output 2900 ml  Net -1477.54 ml   Filed Weights   06/24/18 0515  Weight: 78.9 kg    Examination:  General exam: Appears calm and comfortable  Respiratory system: Clear to auscultation. Respiratory effort normal. Cardiovascular system: S1 & S2 heard, RRR. No JVD, murmurs, rubs, gallops or clicks. No pedal edema. Gastrointestinal system: Abdomen is protuberant, nondistended, soft, mild left lower quadrant tenderness,. No organomegaly or masses felt. Normal bowel sounds heard. Central nervous system: Alert and oriented. No focal neurological deficits. Extremities: Symmetric 5 x 5 power. Skin: No rashes, lesions or ulcers Psychiatry: Judgement and insight appear normal. Mood & affect appropriate.     Data Reviewed: I have personally reviewed following labs and imaging studies  CBC: Recent Labs  Lab 06/23/18 1957 06/24/18 0527 06/25/18 0551 06/26/18 0541 06/27/18 0528  WBC 10.7* 14.5* 12.4* 8.7 6.7  NEUTROABS 7.9*  --   --   --   --   HGB 13.7 12.2 11.6* 10.7* 11.9*  HCT 42.4 38.8 36.5 33.6* 35.0*  MCV 91.8 93.3 93.1 91.6 88.8  PLT 261 269 236 262 993   Basic Metabolic Panel: Recent Labs  Lab 06/23/18 1957 06/24/18 0527 06/25/18 0551 06/26/18 0541 06/27/18 0528  NA 139 139 141 139 138  K 3.5 3.6 3.3* 3.4* 3.5  CL 111 116* 114* 112* 108  CO2 17* 17* 19* 20* 23  GLUCOSE 107* 101* 73 96 96  BUN '21 14 8 ' 5* <5*  CREATININE 1.17* 0.86 0.76 0.79 0.67  CALCIUM 8.4* 7.7* 8.0* 8.1* 8.3*  MG  --   --  1.6* 1.7 1.8   GFR: Estimated Creatinine Clearance: 76.2 mL/min (by C-G formula based on SCr of 0.67 mg/dL). Liver Function Tests: Recent Labs  Lab 06/23/18 1957  AST 13*  ALT 10  ALKPHOS 101  BILITOT 0.5  PROT 6.9  ALBUMIN 3.3*   Recent Labs  Lab 06/23/18 2017  LIPASE 22   No results for input(s): AMMONIA in the last 168 hours. Coagulation Profile: Recent Labs   Lab 06/23/18 2017  INR 0.9   Cardiac Enzymes: Recent Labs  Lab 06/24/18 0803 06/24/18 1326 06/24/18 1947  TROPONINI <0.03 <0.03 <0.03   BNP (last 3 results) No results for input(s): PROBNP in the last 8760 hours. HbA1C: No results for input(s): HGBA1C in the last 72 hours. CBG: No results for input(s): GLUCAP in the last 168 hours. Lipid Profile: No results for input(s): CHOL, HDL, LDLCALC, TRIG, CHOLHDL, LDLDIRECT in the last 72 hours. Thyroid Function Tests: No results for input(s): TSH, T4TOTAL, FREET4, T3FREE, THYROIDAB in the last 72 hours. Anemia Panel: No results for input(s): VITAMINB12, FOLATE, FERRITIN, TIBC, IRON, RETICCTPCT in the last 72 hours. Sepsis Labs: Recent Labs  Lab 06/23/18 2017  LATICACIDVEN 1.5    Recent Results (from the past 240 hour(s))  Gastrointestinal Panel by PCR , Stool     Status: None   Collection Time: 06/23/18  8:22 PM   Specimen: STOOL  Result Value Ref Range Status   Campylobacter species NOT DETECTED NOT DETECTED Final   Plesimonas shigelloides NOT DETECTED NOT DETECTED Final   Salmonella species NOT DETECTED NOT DETECTED Final   Yersinia enterocolitica NOT DETECTED NOT DETECTED Final   Vibrio species NOT DETECTED NOT DETECTED Final   Vibrio cholerae NOT DETECTED NOT DETECTED Final   Enteroaggregative E coli (EAEC) NOT DETECTED NOT DETECTED Final   Enteropathogenic E coli (EPEC) NOT DETECTED NOT DETECTED Final   Enterotoxigenic E coli (ETEC) NOT DETECTED NOT DETECTED Final   Shiga like toxin producing E coli (STEC) NOT DETECTED NOT DETECTED Final   Shigella/Enteroinvasive E coli (EIEC) NOT DETECTED NOT DETECTED Final   Cryptosporidium NOT DETECTED NOT DETECTED Final   Cyclospora cayetanensis NOT DETECTED NOT DETECTED Final   Entamoeba histolytica NOT DETECTED NOT DETECTED Final   Giardia lamblia NOT DETECTED NOT DETECTED Final   Adenovirus F40/41 NOT DETECTED NOT DETECTED Final   Astrovirus NOT DETECTED NOT DETECTED Final    Norovirus GI/GII NOT DETECTED NOT DETECTED Final   Rotavirus A NOT DETECTED NOT DETECTED Final   Sapovirus (I, II, IV, and V) NOT DETECTED NOT DETECTED Final    Comment: Performed at [facility], [place] [place], [place] [address]  C Difficile Quick Screen w PCR reflex     Status: Abnormal   Collection Time: 06/23/18  8:35 PM   Specimen: STOOL  Result Value Ref Range Status   C Diff antigen NEGATIVE (A) NEGATIVE Final   C Diff toxin NEGATIVE (A) NEGATIVE Final   C Diff interpretation VALID  Final    Comment: Performed at [facility], [place] [address], [place], [place] [address]  SARS Coronavirus 2 (CEPHEID - Performed in [facility] hospital lab), Hosp Order     Status: None   Collection Time: 06/23/18  8:35 PM   Specimen: Nasopharyngeal Swab  Result Value Ref Range Status   SARS Coronavirus 2 NEGATIVE NEGATIVE Final  Comment: (NOTE) If result is NEGATIVE SARS-CoV-2 target nucleic acids are NOT DETECTED. The SARS-CoV-2 RNA is generally detectable in upper and lower  respiratory specimens during the acute phase of infection. The lowest  concentration of SARS-CoV-2 viral copies this assay can detect is 250  copies / mL. A negative result does not preclude SARS-CoV-2 infection  and should not be used as the sole basis for treatment or other  patient management decisions.  A negative result may occur with  improper specimen collection / handling, submission of specimen other  than nasopharyngeal swab, presence of viral mutation(s) within the  areas targeted by this assay, and inadequate number of viral copies  (<250 copies / mL). A negative result must be combined with clinical  observations, patient history, and epidemiological information. If result is POSITIVE SARS-CoV-2 target nucleic acids are DETECTED. The SARS-CoV-2 RNA is generally detectable in upper and lower  respiratory specimens dur ing the acute phase of infection.  Positive   results are indicative of active infection with SARS-CoV-2.  Clinical  correlation with patient history and other diagnostic information is  necessary to determine patient infection status.  Positive results do  not rule out bacterial infection or co-infection with other viruses. If result is PRESUMPTIVE POSTIVE SARS-CoV-2 nucleic acids MAY BE PRESENT.   A presumptive positive result was obtained on the submitted specimen  and confirmed on repeat testing.  While 2019 novel coronavirus  (SARS-CoV-2) nucleic acids may be present in the submitted sample  additional confirmatory testing may be necessary for epidemiological  and / or clinical management purposes  to differentiate between  SARS-CoV-2 and other Sarbecovirus currently known to infect humans.  If clinically indicated additional testing with an alternate test  methodology [phone]) is advised. The SARS-CoV-2 RNA is generally  detectable in upper and lower respiratory sp ecimens during the acute  phase of infection. The expected result is Negative. Fact Sheet for Patients:  StrictlyIdeas.no Fact Sheet for Healthcare Providers: BankingDealers.co.za This test is not yet approved or cleared by the [place] FDA and has been authorized for detection and/or diagnosis of SARS-CoV-2 by FDA under an Emergency Use Authorization (EUA).  This EUA will remain in effect (meaning this test can be used) for the duration of the COVID-19 declaration under Section 564(b)(1) of the Act, 21 U.S.C. section 360bbb-3(b)(1), unless the authorization is terminated or revoked sooner. Performed at [facility], [place] [address] [place], [place] [address]          Radiology Studies: No results found.      Scheduled Meds: . amLODipine  5 mg Oral Daily  . feeding supplement  1 Container Oral TID BM  . ketorolac  1 drop Right Eye QID  . multivitamin with minerals  1 tablet  Oral Daily  . nicotine  14 mg Transdermal Daily  . ofloxacin  1 drop Right Eye QID  . pantoprazole  40 mg Oral BID  . prednisoLONE acetate  1 drop Right Eye QID   Continuous Infusions:    LOS: 4 days    Time spent: 26 minutes    Davidson Palmieri J [place] DO [facility] Pager [phone]  If 7PM-7AM, please contact night-coverage [email or web address] Password [email or web address] 06/27/2018, 1:16 PM

## 2018-06-28 LAB — BASIC METABOLIC PANEL
Anion gap: 11 (ref 5–15)
BUN: 8 mg/dL (ref 8–23)
CO2: 25 mmol/L (ref 22–32)
Calcium: 9 mg/dL (ref 8.9–10.3)
Chloride: 100 mmol/L (ref 98–111)
Creatinine, Ser: 0.79 mg/dL (ref 0.44–1.00)
GFR calc Af Amer: >60 mL/min (ref >60)
GFR calc non Af Amer: >60 mL/min (ref >60)
Glucose, Bld: 106 mg/dL — ABNORMAL HIGH (ref 70–99)
Potassium: 4 mmol/L (ref 3.5–5.1)
Sodium: 136 mmol/L (ref 135–145)

## 2018-06-28 LAB — CBC
HCT: 39.7 % (ref 36.0–46.0)
Hemoglobin: 12.8 g/dL (ref 12.0–15.0)
MCH: 28.8 pg (ref 26.0–34.0)
MCHC: 32.2 g/dL (ref 30.0–36.0)
MCV: 89.2 fL (ref 80.0–100.0)
Platelets: 337 10*3/uL (ref 150–400)
RBC: 4.45 MIL/uL (ref 3.87–5.11)
RDW: 14.5 % (ref 11.5–15.5)
WBC: 7.4 10*3/uL (ref 4.0–10.5)
nRBC: 0 % (ref 0.0–0.2)

## 2018-06-28 LAB — MAGNESIUM: Magnesium: 1.8 mg/dL (ref 1.7–2.4)

## 2018-06-28 MED ORDER — SENNOSIDES-DOCUSATE SODIUM 8.6-50 MG PO TABS
1.0000 | ORAL_TABLET | Freq: Every day | ORAL | Status: DC | PRN
  Administered 2018-06-28: 1 via ORAL
  Filled 2018-06-28: qty 1

## 2018-06-28 NOTE — Progress Notes (Signed)
PT Cancellation Note  Patient Details Name: [name] MRN: [id number] DOB: [birth year]   Cancelled Treatment:    Reason Eval/Treat Not Completed: Fatigue/lethargy limiting ability to participate will check back as schedule allows.  Julien Girt, PT Acute Rehabilitation Services Pager [phone]  Office [phone]    Roxine Caddy D Elonda Husky 06/28/2018, 2:46 PM

## 2018-06-28 NOTE — Progress Notes (Signed)
PROGRESS NOTE    [name]  PXT:[id number] DOB: September 05, 1955 DOA: 06/23/2018 PCP: Nolene Ebbs, MD     Brief Narrative:  Supriya Beaston a 63 y.o.femalewith medical history significant forpolysubstance abuse (cocaine, opioids), HTN, CKD 3, rheumatoid arthritis, tobacco abuse, questionable PE 2017 (indeterminate VQ scan, doesn't appear she is currently anticoagulated), spinal stenosis, and episode of ischemic colitis in 2018 thought to be 2/2 cocaine use, presents with 5 days of symptoms, gradually worsening and now much worse today. Has diffuse abdominal pain more on the left than on the right. Constant and also crampy. Was initially constipated and then a few days ago began to pass small amounts of bright red blood when attempted to defecate. Today had increased abdominal pain and had a large volume bowel movement that contained what she describes as a significant amount of bright red blood. Mild nausea, no vomiting. Patient is unsure what meds she takes, says she is given a pill pack. Says she takes some sort of opioid, unsure what. Last cocaine use 3 days ago. No fevers. No recent travel or sick contacts. Colonoscopy performed in 2018 after similar presentation, findings suggestive of ischemic colitis, thought to be 2/2 cocaine use. GI was consulted.  CT findings suggestive of ischemic colitis especially in setting of continued cocaine abuse.  New events last 24 hours / Subjective: Continues to have some lower abdominal pain, afraid to eat much due to her pain and worried about her bowel movements.  Asking for stool softener.  PT has recommended SNF placement although placement has been difficult due to her cocaine abuse history.  Patient also refusing home health on discharge.  Discussed with her that patient will have to return back home with assistance from her family members and friends.  Assessment & Plan:   Principal Problem:   Colitis Active Problems:   Rheumatoid arthritis  (HCC)   HTN (hypertension), benign   Cocaine abuse (HCC)   Tobacco abuse   COPD (chronic obstructive pulmonary disease) (HCC)   Hematochezia   Colitis, suspect ischemic in the setting of cocaine abuse Lower GI bleed Patient presenting with 5 days of gradually worsening cramping diffuse abdominal pain now associated with bright red blood per rectum.  CT abdomen/pelvis notable for transverse and descending colon wall thickening consistent with infectious versus inflammatory versus ischemic colitis.  History of similar episode August 2018 with colonoscopy and pathology notable for focal active colitis and hyperemic changes and negative for malignancy.  Continues with active cocaine use.  C. difficile negative.    GI PCR negative.  SARS-CoV-2 negative.  ESR elevated at 41 and CRP 17.6.  FOBT positive.  Urinalysis unrevealing and urine hCG negative.  Evaluated by gastroenterology, Dr. Collene Mares; CT findings suggestive of ischemic colitis especially in the setting of continued cocaine abuse and no further indication of repeat colonoscopy at this time as this was recently done in 2018 under similar circumstances. -Continue supportive care  Atypical chest pain Patient describing throbbing, constant supraclavicular pain with radiation to her left arm.  EKG unrevealing with notable for normal sinus rhythm without any concerning ST elevation/depression.  Troponin <0.03, and chest x-ray with no acute cardiopulmonary process.  Patient underwent nuclear medicine stress test April 2019 with EF 61% normal LV wall motion and no reversible ischemia or infarction.  This is likely secondary to her high anxiety state as well with continued cocaine abuse. -No complaints of chest pain today  Essential hypertension Patient reports history of hypertension on medication at home.  Cannot  recall as she gets them in blister packs from her pharmacy.   -Restarted home amlodipine  Hx rheumatoid arthritis Patient denies any DMARD  medication. Has been on prednisone 5 mg p.o. daily in the past.  Unable to clearly define what medications she is on currently. Continue outpatient follow-up  Chronic pain syndrome Patient previously followed by the pain clinic, but was discharged from their clinic due to losing her pain medicine, and Suboxone levels previously undetectable, and history of persistent cocaine abuse. -Discontinued Dilaudid on 06/26/2018 -Hydrocodone/acetaminophen 5 mg / 325 mg p.o. every 6 hours as needed  Polysubstance abuse with tobacco, opiates, cocaine UDS positive for cocaine.  Patient endorses cocaine use last 3 days ago.  Discussed with patient need for complete cessation as this is likely cause of her presenting symptoms.  GERD Continue PPI twice daily  Weakness/debility Not qualify for rehab placement per case management due to her active cocaine use.  Patient declines home health therapy. Continue PT efforts while inpatient, hopeful for discharge home soon.   DVT prophylaxis: SCD Code Status: Full code Family Communication: None Disposition Plan: PT recommends SNF placement, however unable to find placement due to history of cocaine abuse.  Patient refusing home health on discharge.  Plan for discharge home on 6/18 as long as her symptoms remain well controlled.   Consultants:   GI  Procedures:   None  Antimicrobials:  Anti-infectives (From admission, onward)   Start     Dose/Rate Route Frequency Ordered Stop   06/24/18 0800  metroNIDAZOLE (FLAGYL) IVPB 500 mg  Status:  Discontinued     500 mg 100 mL/hr over 60 Minutes Intravenous Every 8 hours 06/24/18 0131 06/26/18 1235   06/24/18 0030  cefTRIAXone (ROCEPHIN) 2 g in sodium chloride 0.9 % 100 mL IVPB  Status:  Discontinued     2 g 200 mL/hr over 30 Minutes Intravenous Daily at bedtime 06/24/18 0027 06/26/18 1235   06/23/18 2315  levofloxacin (LEVAQUIN) IVPB 500 mg  Status:  Discontinued     500 mg 100 mL/hr over 60 Minutes  Intravenous  Once 06/23/18 2302 06/24/18 0026   06/23/18 2315  metroNIDAZOLE (FLAGYL) IVPB 500 mg     500 mg 100 mL/hr over 60 Minutes Intravenous  Once 06/23/18 2302 06/24/18 0126        Objective: Vitals:   06/27/18 1500 06/27/18 2105 06/28/18 0544 06/28/18 1241  BP:  (!) 143/87 140/84 134/84  Pulse:  79 79 77  Resp:  20 20   Temp:  98.7 F (37.1 C) 98.9 F (37.2 C) 98.2 F (36.8 C)  TempSrc:  Oral Oral   SpO2: 96% 97% 97% 96%  Weight:      Height:        Intake/Output Summary (Last 24 hours) at 06/28/2018 1346 Last data filed at 06/28/2018 0548 Gross per 24 hour  Intake 0 ml  Output 1000 ml  Net -1000 ml   Filed Weights   06/24/18 0515  Weight: 78.9 kg    Examination:  General exam: Appears calm and comfortable  Respiratory system: Clear to auscultation. Respiratory effort normal. Cardiovascular system: S1 & S2 heard, RRR. No JVD, murmurs, rubs, gallops or clicks. No pedal edema. Gastrointestinal system: Abdomen is nondistended, soft and mild generalized abdomine tender to palpation Central nervous system: Alert and oriented. No focal neurological deficits. Extremities: Symmetric 5 x 5 power. Skin: No rashes, lesions or ulcers Psychiatry: Judgement and insight appear normal. Mood & affect appropriate.   Data Reviewed: I  have personally reviewed following labs and imaging studies  CBC: Recent Labs  Lab 06/23/18 1957 06/24/18 0527 06/25/18 0551 06/26/18 0541 06/27/18 0528 06/28/18 0535  WBC 10.7* 14.5* 12.4* 8.7 6.7 7.4  NEUTROABS 7.9*  --   --   --   --   --   HGB 13.7 12.2 11.6* 10.7* 11.9* 12.8  HCT 42.4 38.8 36.5 33.6* 35.0* 39.7  MCV 91.8 93.3 93.1 91.6 88.8 89.2  PLT 261 269 236 262 275 330   Basic Metabolic Panel: Recent Labs  Lab 06/24/18 0527 06/25/18 0551 06/26/18 0541 06/27/18 0528 06/28/18 0535  NA 139 141 139 138 136  K 3.6 3.3* 3.4* 3.5 4.0  CL 116* 114* 112* 108 100  CO2 17* 19* 20* 23 25  GLUCOSE 101* 73 96 96 106*  BUN 14 8  5* <5* 8  CREATININE 0.86 0.76 0.79 0.67 0.79  CALCIUM 7.7* 8.0* 8.1* 8.3* 9.0  MG  --  1.6* 1.7 1.8 1.8   GFR: Estimated Creatinine Clearance: 76.2 mL/min (by C-G formula based on SCr of 0.79 mg/dL). Liver Function Tests: Recent Labs  Lab 06/23/18 1957  AST 13*  ALT 10  ALKPHOS 101  BILITOT 0.5  PROT 6.9  ALBUMIN 3.3*   Recent Labs  Lab 06/23/18 2017  LIPASE 22   No results for input(s): AMMONIA in the last 168 hours. Coagulation Profile: Recent Labs  Lab 06/23/18 2017  INR 0.9   Cardiac Enzymes: Recent Labs  Lab 06/24/18 0803 06/24/18 1326 06/24/18 1947  TROPONINI <0.03 <0.03 <0.03   BNP (last 3 results) No results for input(s): PROBNP in the last 8760 hours. HbA1C: No results for input(s): HGBA1C in the last 72 hours. CBG: No results for input(s): GLUCAP in the last 168 hours. Lipid Profile: No results for input(s): CHOL, HDL, LDLCALC, TRIG, CHOLHDL, LDLDIRECT in the last 72 hours. Thyroid Function Tests: No results for input(s): TSH, T4TOTAL, FREET4, T3FREE, THYROIDAB in the last 72 hours. Anemia Panel: No results for input(s): VITAMINB12, FOLATE, FERRITIN, TIBC, IRON, RETICCTPCT in the last 72 hours. Sepsis Labs: Recent Labs  Lab 06/23/18 2017  LATICACIDVEN 1.5    Recent Results (from the past 240 hour(s))  Gastrointestinal Panel by PCR , Stool     Status: None   Collection Time: 06/23/18  8:22 PM   Specimen: STOOL  Result Value Ref Range Status   Campylobacter species NOT DETECTED NOT DETECTED Final   Plesimonas shigelloides NOT DETECTED NOT DETECTED Final   Salmonella species NOT DETECTED NOT DETECTED Final   Yersinia enterocolitica NOT DETECTED NOT DETECTED Final   Vibrio species NOT DETECTED NOT DETECTED Final   Vibrio cholerae NOT DETECTED NOT DETECTED Final   Enteroaggregative E coli (EAEC) NOT DETECTED NOT DETECTED Final   Enteropathogenic E coli (EPEC) NOT DETECTED NOT DETECTED Final   Enterotoxigenic E coli (ETEC) NOT DETECTED NOT  DETECTED Final   Shiga like toxin producing E coli (STEC) NOT DETECTED NOT DETECTED Final   Shigella/Enteroinvasive E coli (EIEC) NOT DETECTED NOT DETECTED Final   Cryptosporidium NOT DETECTED NOT DETECTED Final   Cyclospora cayetanensis NOT DETECTED NOT DETECTED Final   Entamoeba histolytica NOT DETECTED NOT DETECTED Final   Giardia lamblia NOT DETECTED NOT DETECTED Final   Adenovirus F40/41 NOT DETECTED NOT DETECTED Final   Astrovirus NOT DETECTED NOT DETECTED Final   Norovirus GI/GII NOT DETECTED NOT DETECTED Final   Rotavirus A NOT DETECTED NOT DETECTED Final   Sapovirus (I, II, IV, and V) NOT DETECTED  NOT DETECTED Final    Comment: Performed at [facility], [place], [place] [address]  C Difficile Quick Screen w PCR reflex     Status: Abnormal   Collection Time: 06/23/18  8:35 PM   Specimen: STOOL  Result Value Ref Range Status   C Diff antigen NEGATIVE (A) NEGATIVE Final   C Diff toxin NEGATIVE (A) NEGATIVE Final   C Diff interpretation VALID  Final    Comment: Performed at [facility], [place] [address], [place], [place] [address]  SARS Coronavirus 2 (CEPHEID - Performed in [place] hospital lab), Hosp Order     Status: None   Collection Time: 06/23/18  8:35 PM   Specimen: Nasopharyngeal Swab  Result Value Ref Range Status   SARS Coronavirus 2 NEGATIVE NEGATIVE Final    Comment: (NOTE) If result is NEGATIVE SARS-CoV-2 target nucleic acids are NOT DETECTED. The SARS-CoV-2 RNA is generally detectable in upper and lower  respiratory specimens during the acute phase of infection. The lowest  concentration of SARS-CoV-2 viral copies this assay can detect is 250  copies / mL. A negative result does not preclude SARS-CoV-2 infection  and should not be used as the sole basis for treatment or other  patient management decisions.  A negative result may occur with  improper specimen collection / handling, submission of specimen other  than  nasopharyngeal swab, presence of viral mutation(s) within the  areas targeted by this assay, and inadequate number of viral copies  (<250 copies / mL). A negative result must be combined with clinical  observations, patient history, and epidemiological information. If result is POSITIVE SARS-CoV-2 target nucleic acids are DETECTED. The SARS-CoV-2 RNA is generally detectable in upper and lower  respiratory specimens dur ing the acute phase of infection.  Positive  results are indicative of active infection with SARS-CoV-2.  Clinical  correlation with patient history and other diagnostic information is  necessary to determine patient infection status.  Positive results do  not rule out bacterial infection or co-infection with other viruses. If result is PRESUMPTIVE POSTIVE SARS-CoV-2 nucleic acids MAY BE PRESENT.   A presumptive positive result was obtained on the submitted specimen  and confirmed on repeat testing.  While 2019 novel coronavirus  (SARS-CoV-2) nucleic acids may be present in the submitted sample  additional confirmatory testing may be necessary for epidemiological  and / or clinical management purposes  to differentiate between  SARS-CoV-2 and other Sarbecovirus currently known to infect humans.  If clinically indicated additional testing with an alternate test  methodology [phone]) is advised. The SARS-CoV-2 RNA is generally  detectable in upper and lower respiratory sp ecimens during the acute  phase of infection. The expected result is Negative. Fact Sheet for Patients:  StrictlyIdeas.no Fact Sheet for Healthcare Providers: BankingDealers.co.za This test is not yet approved or cleared by the [place] FDA and has been authorized for detection and/or diagnosis of SARS-CoV-2 by FDA under an Emergency Use Authorization (EUA).  This EUA will remain in effect (meaning this test can be used) for the duration of the  COVID-19 declaration under Section 564(b)(1) of the Act, 21 U.S.C. section 360bbb-3(b)(1), unless the authorization is terminated or revoked sooner. Performed at [facility], [place] [address], [place], [place] [address]        Radiology Studies: No results found.    Scheduled Meds: . amLODipine  5 mg Oral Daily  . feeding supplement  1 Container Oral TID BM  .  ketorolac  1 drop Right Eye QID  . multivitamin with minerals  1 tablet Oral Daily  . nicotine  14 mg Transdermal Daily  . ofloxacin  1 drop Right Eye QID  . pantoprazole  40 mg Oral BID  . prednisoLONE acetate  1 drop Right Eye QID   Continuous Infusions:   LOS: 5 days    Time spent: 35 minutes   Dessa Phi, DO [facility] [email or web address] 06/28/2018, 1:46 PM

## 2018-06-29 MED ORDER — POLYETHYLENE GLYCOL 3350 17 G PO PACK: 17.0000 g | PACK | Freq: Every day | ORAL | 0 refills | Status: DC | PRN

## 2018-06-29 MED ORDER — PANTOPRAZOLE SODIUM 40 MG PO TBEC: 40.0000 mg | DELAYED_RELEASE_TABLET | Freq: Two times a day (BID) | ORAL | 0 refills | Status: DC

## 2018-06-29 MED ORDER — SENNOSIDES-DOCUSATE SODIUM 8.6-50 MG PO TABS: 1.0000 | ORAL_TABLET | Freq: Every day | ORAL | 0 refills | Status: DC | PRN

## 2018-06-29 NOTE — Progress Notes (Signed)
Patient discharged to home with daughter. Discharge instructions reviewed with patient who verbalized understanding.

## 2018-06-29 NOTE — Discharge Summary (Signed)
Physician Discharge Summary  [name] JHE:[id number] DOB: 06-01-1955 DOA: 06/23/2018  PCP: Nolene Ebbs, MD  Admit date: 06/23/2018 Discharge date: 06/29/2018  Admitted From: Home Disposition:  Home (not able to place in SNF due to cocaine abuse hx, patient declined home health services)   Recommendations for Outpatient Follow-up:  1. Follow up with PCP in 1 week 2. Follow up with GI Dr. Collene Mares in 4 weeks   Discharge Condition: Stable CODE STATUS: Full  Diet recommendation: Soft diet   Brief/Interim Summary: [name] a 63 y.o.femalewith medical history significant forpolysubstance abuse (cocaine, opioids), HTN, CKD 3, rheumatoid arthritis, tobacco abuse, questionable PE 2017 (indeterminate VQ scan, doesn't appear she is currently anticoagulated), spinal stenosis, and episode of ischemic colitis in 2018 thought to be 2/2 cocaine use, presents with 5 days of symptoms, gradually worsening and now much worse today. Has diffuse abdominal pain more on the left than on the right. Constant and also crampy. Was initially constipated and then a few days ago began to pass small amounts of bright red blood when attempted to defecate. Today had increased abdominal pain and had a large volume bowel movement that contained what she describes as a significant amount of bright red blood. Mild nausea, no vomiting. Patient is unsure what meds she takes, says she is given a pill pack. Says she takes some sort of opioid, unsure what. Last cocaine use 3 days ago. No fevers. No recent travel or sick contacts. Colonoscopy performed in 2018 after similar presentation, findings suggestive of ischemic colitis, thought to be 2/2 cocaine use. GI was consulted.  CT findings suggestive of ischemic colitis especially in setting of continued cocaine abuse. She was treated supportively during hospitalization. On day of discharge, patient's abdominal pain had improved, tolerated diet.   Discharge Diagnoses:   Principal Problem:   Colitis Active Problems:   Rheumatoid arthritis ([place])   HTN (hypertension), benign   Cocaine abuse (HCC)   Tobacco abuse   COPD (chronic obstructive pulmonary disease) (HCC)   Hematochezia  Colitis, suspect ischemic in the setting of cocaine abuse Lower GI bleed Patient presenting with 5 days of gradually worsening cramping diffuse abdominal pain now associated with bright red blood per rectum. CT abdomen/pelvis notable for transverse and descending colon wall thickening consistent with infectious versus inflammatory versus ischemic colitis. History of similar episode August 2018 with colonoscopy and pathology notable for focal active colitis and hyperemic changes and negative for malignancy. Continues with active cocaine use. C. difficile negative.   GI PCR negative.  SARS-CoV-2 negative. ESR elevated at 41 and CRP 17.6. FOBT positive. Urinalysis unrevealing and urine hCG negative. Evaluated by gastroenterology, Dr. Collene Mares; CT findings suggestive of ischemic colitis especially in the setting of continued cocaine abuse and no further indication of repeat colonoscopy at this time as this was recently done in 2018 under similar circumstances. -Continue supportive care -Stop cocaine use, discussed with patient   Atypical chest pain Patient describing throbbing, constant supraclavicular pain with radiation to her left arm. EKG unrevealing with notable for normal sinus rhythm without any concerning ST elevation/depression. Troponin <0.03, and chest x-ray with no acute cardiopulmonary process. Patient underwent nuclear medicine stress test April 2019 with EF 61% normal LV wall motion and no reversible ischemia or infarction. This is likely secondary to her high anxiety state as well with continued cocaine abuse. -No complaints of chest pain today  Essential hypertension Patient reports history of hypertension on medication at home. Cannot recall as she gets them in  blister packs from her pharmacy.  -Restarted home amlodipine  Hx rheumatoid arthritis Patient denies any DMARD medication. Has been on prednisone 5 mg p.o. daily in the past. Unable to clearly define what medications she is on currently. Continue outpatient follow-up  Chronic pain syndrome Patient previously followed by the pain clinic, but was discharged from their clinic due to losing her pain medicine, and Suboxone levels previously undetectable, and history of persistent cocaine abuse. -DiscontinuedDilaudid on 06/26/2018 -Discussed with patient that no narcotics will be prescribed at discharge   Polysubstance abuse with tobacco, opiates, cocaine UDS positive for cocaine. Patient endorses cocaine use last 3 days ago. Discussed with patient need for complete cessation as this is likely cause of her presenting symptoms.  GERD Continue PPI twice daily  Weakness/debility Not qualify for rehab placement per case management due to her active cocaine use. Patient declines home health therapy. She also declined to work with PT while inpatient on 6/17.   Discharge Instructions  Discharge Instructions    Call MD for:   Complete by: As directed    Recurrent GI bleeding   Call MD for:  difficulty breathing, headache or visual disturbances   Complete by: As directed    Call MD for:  extreme fatigue   Complete by: As directed    Call MD for:  persistant dizziness or light-headedness   Complete by: As directed    Call MD for:  persistant nausea and vomiting   Complete by: As directed    Call MD for:  severe uncontrolled pain   Complete by: As directed    Call MD for:  temperature >100.4   Complete by: As directed    Diet - low sodium heart healthy   Complete by: As directed    Discharge instructions   Complete by: As directed    STOP COCAINE USE INDEFINITELY.   You were cared for by a hospitalist during your hospital stay. If you have any questions about your discharge  medications or the care you received while you were in the hospital after you are discharged, you can call the unit and ask to speak with the hospitalist on call if the hospitalist that took care of you is not available. Once you are discharged, your primary care physician will handle any further medical issues. Please note that NO REFILLS for any discharge medications will be authorized once you are discharged, as it is imperative that you return to your primary care physician (or establish a relationship with a primary care physician if you do not have one) for your aftercare needs so that they can reassess your need for medications and monitor your lab values.   Increase activity slowly   Complete by: As directed      Allergies as of 06/29/2018      Reactions   Iohexol Anaphylaxis   Levofloxacin Other (See Comments)   BASELINE PROLONGED QTc.     Zofran [ondansetron Hcl] Other (See Comments)   BASELINE PROLONGED QTc.   Heparin Itching, Other (See Comments)   Pt is able to tolerate IV heparin, but not sub-Q.   Morphine And Related Itching   Penicillins Itching   Has patient had a PCN reaction causing immediate rash, facial/tongue/throat swelling, SOB or lightheadedness with hypotension: Yes Has patient had a PCN reaction causing severe rash involving mucus membranes or skin necrosis: No Has patient had a PCN reaction that required hospitalization: No Has patient had a PCN reaction occurring within the last 10 years:  Yes If all of the above answers are "NO", then may proceed with Cephalosporin use.      Medication List    STOP taking these medications   methocarbamol 500 MG tablet Commonly known as: ROBAXIN   oxybutynin 5 MG tablet Commonly known as: DITROPAN   oxyCODONE-acetaminophen 5-325 MG tablet Commonly known as: PERCOCET/ROXICET   predniSONE 5 MG tablet Commonly known as: DELTASONE   tamsulosin 0.4 MG Caps capsule Commonly known as: FLOMAX   trimethoprim 100 MG  tablet Commonly known as: TRIMPEX     TAKE these medications   albuterol 108 (90 Base) MCG/ACT inhaler Commonly known as: VENTOLIN HFA Inhale 1-2 puffs into the lungs every 6 (six) hours as needed for wheezing or shortness of breath.   amLODipine 5 MG tablet Commonly known as: NORVASC TAKE 1 TABLET (5 MG TOTAL) BY MOUTH DAILY.   atorvastatin 40 MG tablet Commonly known as: LIPITOR Take 1 tablet (40 mg total) by mouth daily at 6 PM.   ketorolac 0.4 % Soln Commonly known as: ACULAR Place 1 drop into the right eye 4 (four) times daily.   ofloxacin 0.3 % ophthalmic solution Commonly known as: OCUFLOX Place 1 drop into the right eye 4 (four) times daily.   pantoprazole 40 MG tablet Commonly known as: PROTONIX Take 1 tablet (40 mg total) by mouth 2 (two) times daily before a meal. What changed: when to take this   polyethylene glycol 17 g packet Commonly known as: MIRALAX / GLYCOLAX Take 17 g by mouth daily as needed for mild constipation.   prednisoLONE acetate 1 % ophthalmic suspension Commonly known as: PRED FORTE Place 1 drop into the right eye 4 (four) times daily.   senna-docusate 8.6-50 MG tablet Commonly known as: Senokot-S Take 1 tablet by mouth daily as needed for mild constipation.   sucralfate 1 g tablet Commonly known as: CARAFATE Take 1 tablet (1 g total) by mouth 4 (four) times daily -  with meals and at bedtime.      Follow-up Information    Nolene Ebbs, MD. Schedule an appointment as soon as possible for a visit in 1 week(s).   Specialty: Internal Medicine Contact information: [address] [place] [address] [phone]        Juanita Craver, MD. Schedule an appointment as soon as possible for a visit in 4 week(s).   Specialty: Gastroenterology Contact information: [address], [place] [address] [phone]          Allergies  Allergen Reactions  . Iohexol Anaphylaxis  . Levofloxacin Other (See  Comments)    BASELINE PROLONGED QTc.    Marland Kitchen Zofran [Ondansetron Hcl] Other (See Comments)    BASELINE PROLONGED QTc.  Marland Kitchen Heparin Itching and Other (See Comments)    Pt is able to tolerate IV heparin, but not sub-Q.  Marland Kitchen Morphine And Related Itching  . Penicillins Itching    Has patient had a PCN reaction causing immediate rash, facial/tongue/throat swelling, SOB or lightheadedness with hypotension: Yes Has patient had a PCN reaction causing severe rash involving mucus membranes or skin necrosis: No Has patient had a PCN reaction that required hospitalization: No Has patient had a PCN reaction occurring within the last 10 years: Yes If all of the above answers are "NO", then may proceed with Cephalosporin use.      Consultations:  GI    Procedures/Studies: Ct Abdomen Pelvis Wo Contrast  Result Date: 06/23/2018 CLINICAL DATA:  Bloody stools. EXAM: CT ABDOMEN  AND PELVIS WITHOUT CONTRAST TECHNIQUE: Multidetector CT imaging of the abdomen and pelvis was performed following the standard protocol without IV contrast. COMPARISON:  CT dated 08/28/2016. FINDINGS: Lower chest: There is scarring versus atelectasis at the right lung base. Hepatobiliary: No focal liver abnormality is seen. No gallstones, gallbladder wall thickening, or biliary dilatation. Pancreas: Unremarkable. No pancreatic ductal dilatation or surrounding inflammatory changes. Spleen: Normal in size without focal abnormality. Adrenals/Urinary Tract: Adrenal glands are unremarkable. Kidneys are normal, without renal calculi, focal lesion, or hydronephrosis. Bladder is unremarkable. Stomach/Bowel: There is a large hiatal hernia. There is diffuse wall thickening of the transverse colon, splenic flexure, and descending colon. The appendix is unremarkable. There is no evidence of a small-bowel obstruction. Vascular/Lymphatic: Aortic atherosclerosis. No enlarged abdominal or pelvic lymph nodes. Reproductive: Status post hysterectomy. No adnexal  masses. Other: No abdominal wall hernia or abnormality. No abdominopelvic ascites. Musculoskeletal: No acute or significant osseous findings. There is a stable metallic foreign body at the level of the sacrum. There are degenerative changes throughout the visualized thoracolumbar spine. IMPRESSION: 1. Diffuse wall thickening of the transverse colon, splenic flexure, and descending colon is consistent with infectious or inflammatory colitis. In this location, ischemic colitis is also a possibility. 2. Large hiatal hernia. Electronically Signed   By: Constance Holster M.D.   On: 06/23/2018 22:44   Dg Chest 1 View  Result Date: 06/23/2018 CLINICAL DATA:  Shortness of breath EXAM: CHEST  1 VIEW COMPARISON:  04/13/2017 FINDINGS: Heart and mediastinal contours are within normal limits. No focal opacities or effusions. No acute bony abnormality. Old healed left rib fractures. IMPRESSION: No active cardiopulmonary disease. Electronically Signed   By: Rolm Baptise M.D.   On: 06/23/2018 20:34   Dg Chest Port 1 View  Result Date: 06/24/2018 CLINICAL DATA:  Intermittent left-sided chest pain for 2 months. EXAM: PORTABLE CHEST 1 VIEW COMPARISON:  June 23, 2018 FINDINGS: The heart size and mediastinal contours are within normal limits. Both lungs are clear. The visualized skeletal structures are unremarkable. IMPRESSION: No active disease. Electronically Signed   By: Dorise Bullion III M.D   On: 06/24/2018 13:05      Discharge Exam: Vitals:   06/28/18 2019 06/29/18 0507  BP: 133/73 (!) 151/123  Pulse: 75 84  Resp: 18 16  Temp: 97.6 F (36.4 C) 98.4 F (36.9 C)  SpO2: 99% 96%    General: Pt is alert, awake, not in acute distress Cardiovascular: RRR, S1/S2 +, no rubs, no gallops Respiratory: CTA bilaterally, no wheezing, no rhonchi Abdominal: Soft, NT, ND, bowel sounds + Extremities: no edema, no cyanosis    The results of significant diagnostics from this hospitalization (including imaging,  microbiology, ancillary and laboratory) are listed below for reference.     Microbiology: Recent Results (from the past 240 hour(s))  Gastrointestinal Panel by PCR , Stool     Status: None   Collection Time: 06/23/18  8:22 PM   Specimen: STOOL  Result Value Ref Range Status   Campylobacter species NOT DETECTED NOT DETECTED Final   Plesimonas shigelloides NOT DETECTED NOT DETECTED Final   Salmonella species NOT DETECTED NOT DETECTED Final   Yersinia enterocolitica NOT DETECTED NOT DETECTED Final   Vibrio species NOT DETECTED NOT DETECTED Final   Vibrio cholerae NOT DETECTED NOT DETECTED Final   Enteroaggregative E coli (EAEC) NOT DETECTED NOT DETECTED Final   Enteropathogenic E coli (EPEC) NOT DETECTED NOT DETECTED Final   Enterotoxigenic E coli (ETEC) NOT DETECTED NOT DETECTED Final  Shiga like toxin producing E coli (STEC) NOT DETECTED NOT DETECTED Final   Shigella/Enteroinvasive E coli (EIEC) NOT DETECTED NOT DETECTED Final   Cryptosporidium NOT DETECTED NOT DETECTED Final   Cyclospora cayetanensis NOT DETECTED NOT DETECTED Final   Entamoeba histolytica NOT DETECTED NOT DETECTED Final   Giardia lamblia NOT DETECTED NOT DETECTED Final   Adenovirus F40/41 NOT DETECTED NOT DETECTED Final   Astrovirus NOT DETECTED NOT DETECTED Final   Norovirus GI/GII NOT DETECTED NOT DETECTED Final   Rotavirus A NOT DETECTED NOT DETECTED Final   Sapovirus (I, II, IV, and V) NOT DETECTED NOT DETECTED Final    Comment: Performed at [facility], [place] [address] [place], [place] [address]  C Difficile Quick Screen w PCR reflex     Status: Abnormal   Collection Time: 06/23/18  8:35 PM   Specimen: STOOL  Result Value Ref Range Status   C Diff antigen NEGATIVE (A) NEGATIVE Final   C Diff toxin NEGATIVE (A) NEGATIVE Final   C Diff interpretation VALID  Final    Comment: Performed at [facility], [place] [address] [place], [place] [address]  SARS Coronavirus 2 (CEPHEID -  Performed in [facility] hospital lab), Hosp Order     Status: None   Collection Time: 06/23/18  8:35 PM   Specimen: Nasopharyngeal Swab  Result Value Ref Range Status   SARS Coronavirus 2 NEGATIVE NEGATIVE Final    Comment: (NOTE) If result is NEGATIVE SARS-CoV-2 target nucleic acids are NOT DETECTED. The SARS-CoV-2 RNA is generally detectable in upper and lower  respiratory specimens during the acute phase of infection. The lowest  concentration of SARS-CoV-2 viral copies this assay can detect is 250  copies / mL. A negative result does not preclude SARS-CoV-2 infection  and should not be used as the sole basis for treatment or other  patient management decisions.  A negative result may occur with  improper specimen collection / handling, submission of specimen other  than nasopharyngeal swab, presence of viral mutation(s) within the  areas targeted by this assay, and inadequate number of viral copies  (<250 copies / mL). A negative result must be combined with clinical  observations, patient history, and epidemiological information. If result is POSITIVE SARS-CoV-2 target nucleic acids are DETECTED. The SARS-CoV-2 RNA is generally detectable in upper and lower  respiratory specimens dur ing the acute phase of infection.  Positive  results are indicative of active infection with SARS-CoV-2.  Clinical  correlation with patient history and other diagnostic information is  necessary to determine patient infection status.  Positive results do  not rule out bacterial infection or co-infection with other viruses. If result is PRESUMPTIVE POSTIVE SARS-CoV-2 nucleic acids MAY BE PRESENT.   A presumptive positive result was obtained on the submitted specimen  and confirmed on repeat testing.  While 2019 novel coronavirus  (SARS-CoV-2) nucleic acids may be present in the submitted sample  additional confirmatory testing may be necessary for epidemiological  and / or clinical management  purposes  to differentiate between  SARS-CoV-2 and other Sarbecovirus currently known to infect humans.  If clinically indicated additional testing with an alternate test  methodology [phone]) is advised. The SARS-CoV-2 RNA is generally  detectable in upper and lower respiratory sp ecimens during the acute  phase of infection. The expected result is Negative. Fact Sheet for Patients:  StrictlyIdeas.no Fact Sheet for Healthcare Providers: BankingDealers.co.za This test is not yet approved or cleared by the [place] FDA  and has been authorized for detection and/or diagnosis of SARS-CoV-2 by FDA under an Emergency Use Authorization (EUA).  This EUA will remain in effect (meaning this test can be used) for the duration of the COVID-19 declaration under Section 564(b)(1) of the Act, 21 U.S.C. section 360bbb-3(b)(1), unless the authorization is terminated or revoked sooner. Performed at [facility], [place] [address], [place], [place] [address]      Labs: BNP (last 3 results) No results for input(s): BNP in the last 8760 hours. Basic Metabolic Panel: Recent Labs  Lab 06/24/18 0527 06/25/18 0551 06/26/18 0541 06/27/18 0528 06/28/18 0535  NA 139 141 139 138 136  K 3.6 3.3* 3.4* 3.5 4.0  CL 116* 114* 112* 108 100  CO2 17* 19* 20* 23 25  GLUCOSE 101* 73 96 96 106*  BUN 14 8 5* <5* 8  CREATININE 0.86 0.76 0.79 0.67 0.79  CALCIUM 7.7* 8.0* 8.1* 8.3* 9.0  MG  --  1.6* 1.7 1.8 1.8   Liver Function Tests: Recent Labs  Lab 06/23/18 1957  AST 13*  ALT 10  ALKPHOS 101  BILITOT 0.5  PROT 6.9  ALBUMIN 3.3*   Recent Labs  Lab 06/23/18 2017  LIPASE 22   No results for input(s): AMMONIA in the last 168 hours. CBC: Recent Labs  Lab 06/23/18 1957 06/24/18 0527 06/25/18 0551 06/26/18 0541 06/27/18 0528 06/28/18 0535  WBC 10.7* 14.5* 12.4* 8.7 6.7 7.4  NEUTROABS 7.9*  --   --   --   --   --   HGB 13.7  12.2 11.6* 10.7* 11.9* 12.8  HCT 42.4 38.8 36.5 33.6* 35.0* 39.7  MCV 91.8 93.3 93.1 91.6 88.8 89.2  PLT 261 269 236 262 275 337   Cardiac Enzymes: Recent Labs  Lab 06/24/18 0803 06/24/18 1326 06/24/18 1947  TROPONINI <0.03 <0.03 <0.03   BNP: Invalid input(s): POCBNP CBG: No results for input(s): GLUCAP in the last 168 hours. D-Dimer No results for input(s): DDIMER in the last 72 hours. Hgb A1c No results for input(s): HGBA1C in the last 72 hours. Lipid Profile No results for input(s): CHOL, HDL, LDLCALC, TRIG, CHOLHDL, LDLDIRECT in the last 72 hours. Thyroid function studies No results for input(s): TSH, T4TOTAL, T3FREE, THYROIDAB in the last 72 hours.  Invalid input(s): FREET3 Anemia work up No results for input(s): VITAMINB12, FOLATE, FERRITIN, TIBC, IRON, RETICCTPCT in the last 72 hours. Urinalysis    Component Value Date/Time   COLORURINE STRAW (A) 06/23/2018 2141   APPEARANCEUR CLEAR 06/23/2018 2141   LABSPEC 1.011 06/23/2018 2141   PHURINE 5.0 06/23/2018 2141   GLUCOSEU NEGATIVE 06/23/2018 2141   HGBUR SMALL (A) 06/23/2018 2141   BILIRUBINUR NEGATIVE 06/23/2018 2141   KETONESUR NEGATIVE 06/23/2018 2141   PROTEINUR NEGATIVE 06/23/2018 2141   UROBILINOGEN 2.0 (H) 10/28/2014 0141   NITRITE NEGATIVE 06/23/2018 2141   LEUKOCYTESUR TRACE (A) 06/23/2018 2141   Sepsis Labs Invalid input(s): PROCALCITONIN,  WBC,  LACTICIDVEN Microbiology Recent Results (from the past 240 hour(s))  Gastrointestinal Panel by PCR , Stool     Status: None   Collection Time: 06/23/18  8:22 PM   Specimen: STOOL  Result Value Ref Range Status   Campylobacter species NOT DETECTED NOT DETECTED Final   Plesimonas shigelloides NOT DETECTED NOT DETECTED Final   Salmonella species NOT DETECTED NOT DETECTED Final   Yersinia enterocolitica NOT DETECTED NOT DETECTED Final   Vibrio species NOT DETECTED NOT DETECTED Final   Vibrio cholerae NOT DETECTED NOT DETECTED Final  Enteroaggregative E  coli (EAEC) NOT DETECTED NOT DETECTED Final   Enteropathogenic E coli (EPEC) NOT DETECTED NOT DETECTED Final   Enterotoxigenic E coli (ETEC) NOT DETECTED NOT DETECTED Final   Shiga like toxin producing E coli (STEC) NOT DETECTED NOT DETECTED Final   Shigella/Enteroinvasive E coli (EIEC) NOT DETECTED NOT DETECTED Final   Cryptosporidium NOT DETECTED NOT DETECTED Final   Cyclospora cayetanensis NOT DETECTED NOT DETECTED Final   Entamoeba histolytica NOT DETECTED NOT DETECTED Final   Giardia lamblia NOT DETECTED NOT DETECTED Final   Adenovirus F40/41 NOT DETECTED NOT DETECTED Final   Astrovirus NOT DETECTED NOT DETECTED Final   Norovirus GI/GII NOT DETECTED NOT DETECTED Final   Rotavirus A NOT DETECTED NOT DETECTED Final   Sapovirus (I, II, IV, and V) NOT DETECTED NOT DETECTED Final    Comment: Performed at [facility], [address] [place], [place] [address]  C Difficile Quick Screen w PCR reflex     Status: Abnormal   Collection Time: 06/23/18  8:35 PM   Specimen: STOOL  Result Value Ref Range Status   C Diff antigen NEGATIVE (A) NEGATIVE Final   C Diff toxin NEGATIVE (A) NEGATIVE Final   C Diff interpretation VALID  Final    Comment: Performed at [facility], [place] [address] [place], [place] [address]  SARS Coronavirus 2 (CEPHEID - Performed in [facility] hospital lab), Hosp Order     Status: None   Collection Time: 06/23/18  8:35 PM   Specimen: Nasopharyngeal Swab  Result Value Ref Range Status   SARS Coronavirus 2 NEGATIVE NEGATIVE Final    Comment: (NOTE) If result is NEGATIVE SARS-CoV-2 target nucleic acids are NOT DETECTED. The SARS-CoV-2 RNA is generally detectable in upper and lower  respiratory specimens during the acute phase of infection. The lowest  concentration of SARS-CoV-2 viral copies this assay can detect is 250  copies / mL. A negative result does not preclude SARS-CoV-2 infection  and should not be used as the sole basis for  treatment or other  patient management decisions.  A negative result may occur with  improper specimen collection / handling, submission of specimen other  than nasopharyngeal swab, presence of viral mutation(s) within the  areas targeted by this assay, and inadequate number of viral copies  (<250 copies / mL). A negative result must be combined with clinical  observations, patient history, and epidemiological information. If result is POSITIVE SARS-CoV-2 target nucleic acids are DETECTED. The SARS-CoV-2 RNA is generally detectable in upper and lower  respiratory specimens dur ing the acute phase of infection.  Positive  results are indicative of active infection with SARS-CoV-2.  Clinical  correlation with patient history and other diagnostic information is  necessary to determine patient infection status.  Positive results do  not rule out bacterial infection or co-infection with other viruses. If result is PRESUMPTIVE POSTIVE SARS-CoV-2 nucleic acids MAY BE PRESENT.   A presumptive positive result was obtained on the submitted specimen  and confirmed on repeat testing.  While 2019 novel coronavirus  (SARS-CoV-2) nucleic acids may be present in the submitted sample  additional confirmatory testing may be necessary for epidemiological  and / or clinical management purposes  to differentiate between  SARS-CoV-2 and other Sarbecovirus currently known to infect humans.  If clinically indicated additional testing with an alternate test  methodology [phone]) is advised. The SARS-CoV-2 RNA is generally  detectable in upper and lower respiratory sp ecimens during the acute  phase of infection. The expected result is Negative. Fact Sheet for Patients:  StrictlyIdeas.no Fact Sheet for Healthcare Providers: BankingDealers.co.za This test is not yet approved or cleared by the [place] FDA and has been authorized for detection and/or  diagnosis of SARS-CoV-2 by FDA under an Emergency Use Authorization (EUA).  This EUA will remain in effect (meaning this test can be used) for the duration of the COVID-19 declaration under Section 564(b)(1) of the Act, 21 U.S.C. section 360bbb-3(b)(1), unless the authorization is terminated or revoked sooner. Performed at [facility], [place] [address] [place], [place] [address]      Patient was seen and examined on the day of discharge and was found to be in stable condition. Time coordinating discharge: 35 minutes including assessment and coordination of care, as well as examination of the patient.   SIGNED:  Dessa Phi, DO [facility] [email or web address] 06/29/2018, 9:49 AM

## 2018-06-29 NOTE — Discharge Instructions (Signed)
Ischemic Colitis    Ischemic colitis is damage to the large intestine due to reduced blood flow (ischemia) to the colon. The colon is the last section of the large intestine, where stool is formed. The reduced blood flow may lead to the death of cells (necrosis) in the lining of the colon, damaging the colon and often causing bleeding.  Most cases of ischemic colitis clear up in a few days with treatment. In other cases, blood flow does not improve, and parts of the colon start to die. This is extremely serious and even life-threatening. If this happens, surgery may be required. In some cases, parts of the colon may need to be removed.  What are the causes?  Ischemic colitis results from a decrease in the blood supply to the colon. Many conditions can cause this, such as:  Heart problems that reduce blood flow to the arteries that supply the colon. These include problems such as coronary heart disease, peripheral vascular disease, atrial fibrillation, and congestive heart failure.  Low blood pressure from:  An infection that spreads to the blood (sepsis).  Dehydration or bleeding (shock).  Drugs that narrow blood vessels (vasoconstrictors).  Sometimes the cause is not known.  What increases the risk?  You are more likely to develop this condition if:  You are 60 years of age or older.  You are female.  You have another medical condition, such as:  Heart disease.  Diabetes.  Kidney disease that requires you to be on dialysis.  A disease that causes blood clots.  You are frequently constipated.  You have had surgery on the heart, blood vessels (such as the aorta), or colon.  You take certain medicines or drugs, such as:  Medicines that suppress your immune system (immunomodulators).  Medicines that cause constipation.  Illegal drugs, such as cocaine or methamphetamines.  You get an extreme amount of exercise from long-distance bike riding or running.  What are the signs or symptoms?  Symptoms of this condition start  suddenly and may include:  Dull pain, usually on the left side of the abdomen.  Tenderness of the abdomen.  Abdomen (abdominal) cramps.  An urgent need to have a bowel movement.  Loose, bloody stools with clots of dark or bright red blood.  Nausea and vomiting.  Fever.  Weakness, fatigue, and confusion.  How is this diagnosed?  This condition may be diagnosed based on:  Your symptoms, your medical history, and a physical exam.  Tests to find out more about your condition and to rule out other causes of pain and bleeding. These tests may include:  Blood tests to check for clotting, blood loss, and low proteins in your blood.  CT scan of the colon.  A procedure to examine the inside of your colon using a scope that is passed through the rectum (colonoscopy). Colonoscopy is the most important diagnostic test. During this test, your health care provider may take a small piece of tissue from your colon to be examined under a microscope (biopsy).  How is this treated?  You may be hospitalized for treatment. Treatment usually includes:  Not eating or drinking anything. This allows the colon to rest.  IV fluids to maintain blood pressure, regulate blood minerals (electrolytes), and provide nutrition.  Having a tube inserted into your stomach through your nose (nasogastric tube) to drain your stomach.  IV antibiotic medicines. These may be used if an infection is suspected.  Stopping or changing medicines that may be causing the condition.    You may need surgery if your condition is severe or if it gets worse or does not get better after a few days. Parts of the colon that will not recover may need to be removed. In some cases, a procedure is also done to attach the healthy part of the colon to the outer wall of the abdomen to drain stool (colostomy).  Follow these instructions at home:  Follow instructions from your health care provider about eating or drinking restrictions.  Drink enough fluid to keep your urine clear or  pale yellow.  Take over-the-counter and prescription medicines only as told by your health care provider.  Return to your normal activities as told by your health care provider. Ask your health care provider what activities are safe for you.  Do not use any products that contain nicotine or tobacco, such as cigarettes and e-cigarettes. If you need help quitting, ask your health care provider.  Keep all follow-up visits as told by your health care provider. This is important.  Contact a health care provider if:  You have blood in your stool.  You have abdominal pain or cramps.  You have constipation.  You have nausea or vomiting.  Get help right away if:  You have a moderate to large amount of loose, bloody stools with clots of dark or bright red blood.  You have severe abdominal pain.  Your abdominal pain has not improved after 24 hours.  You have a fever.  You have not been able to have a bowel movement, and you are in pain and vomiting.  You have shortness of breath.  You are very tired (lethargic) or have confusion.  Summary  Ischemic colitis is damage to the large intestine due to reduced blood flow (ischemia) to the colon.  Some of the symptoms of this condition include abdominal pain or tenderness, bloody stools, and an urgent need to have a bowel movement.  Diagnosis usually includes a procedure to examine the inside of the colon using a scope that is passed through the rectum (colonoscopy).  This information is not intended to replace advice given to you by your health care provider. Make sure you discuss any questions you have with your health care provider.  Document Released: 02/16/2016 Document Revised: 02/16/2016 Document Reviewed: 02/16/2016  Elsevier Interactive Patient Education  2019 [employer]vier Inc.

## 2018-07-06 ENCOUNTER — Other Ambulatory Visit: Payer: Self-pay

## 2018-07-06 ENCOUNTER — Emergency Department (HOSPITAL_COMMUNITY)
Admission: EM | Admit: 2018-07-06 | Discharge: 2018-07-06 | Disposition: A | Discharge status: Home / Self Care | Payer: Medicaid Other | Attending: Emergency Medicine | Admitting: Emergency Medicine

## 2018-07-06 ENCOUNTER — Encounter (HOSPITAL_COMMUNITY): Payer: Self-pay | Admitting: Emergency Medicine

## 2018-07-06 DIAGNOSIS — K1120 Sialoadenitis, unspecified: Secondary | ICD-10-CM | POA: Diagnosis not present

## 2018-07-06 DIAGNOSIS — J449 Chronic obstructive pulmonary disease, unspecified: Secondary | ICD-10-CM | POA: Diagnosis not present

## 2018-07-06 DIAGNOSIS — R600 Localized edema: Secondary | ICD-10-CM | POA: Diagnosis present

## 2018-07-06 DIAGNOSIS — F17210 Nicotine dependence, cigarettes, uncomplicated: Secondary | ICD-10-CM | POA: Insufficient documentation

## 2018-07-06 DIAGNOSIS — N183 Chronic kidney disease, stage 3 (moderate): Secondary | ICD-10-CM | POA: Diagnosis not present

## 2018-07-06 DIAGNOSIS — I129 Hypertensive chronic kidney disease with stage 1 through stage 4 chronic kidney disease, or unspecified chronic kidney disease: Secondary | ICD-10-CM | POA: Diagnosis not present

## 2018-07-06 DIAGNOSIS — Z79899 Other long term (current) drug therapy: Secondary | ICD-10-CM | POA: Insufficient documentation

## 2018-07-06 MED ORDER — CLINDAMYCIN HCL 150 MG PO CAPS: 150.0000 mg | ORAL_CAPSULE | Freq: Four times a day (QID) | ORAL | 0 refills | Status: DC

## 2018-07-06 MED ORDER — HYDROCODONE-ACETAMINOPHEN 5-325 MG PO TABS: 2.0000 | ORAL_TABLET | Freq: Four times a day (QID) | ORAL | 0 refills | Status: DC | PRN

## 2018-07-06 NOTE — ED Triage Notes (Signed)
Per pt, states noticed left mass below ear yesterday-states she was treated 2 years ago with antibiotics-possible obstructed parotid gland-states mouth dry

## 2018-07-06 NOTE — ED Notes (Signed)
Bed: RK27 Expected date:  Expected time:  Means of arrival:  Comments: Hold for triage 2
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
## 2018-07-19 IMAGING — CT CT CHEST W/O CM
2 of 3 series · 15 of 36 positions shown, 18 images · non-contrast
Comparison: Chest radiograph dated 01/06/2017

CLINICAL DATA: Left chest pain, shortness of breath x2 weeks

EXAM:
CT CHEST WITHOUT CONTRAST
TECHNIQUE: Multidetector CT imaging of the chest was performed following the
standard protocol without IV contrast.

[Series 4: thorax 2.0 · axial · 0.61mm/px · z∈[+14,+266]mm · 12 of 150 slices shown, 15 images]
[im 12/150  mediastinal]
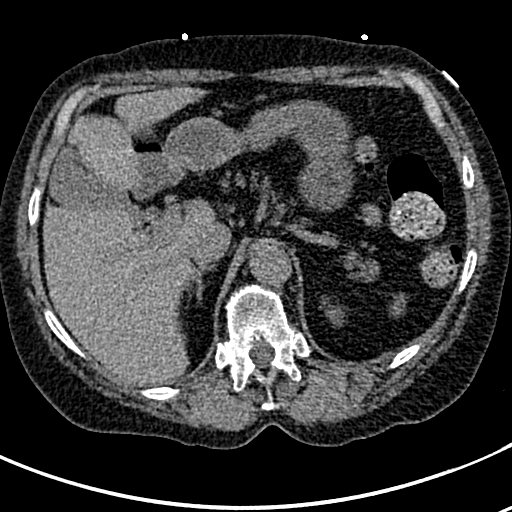
[im 12/150  lung]
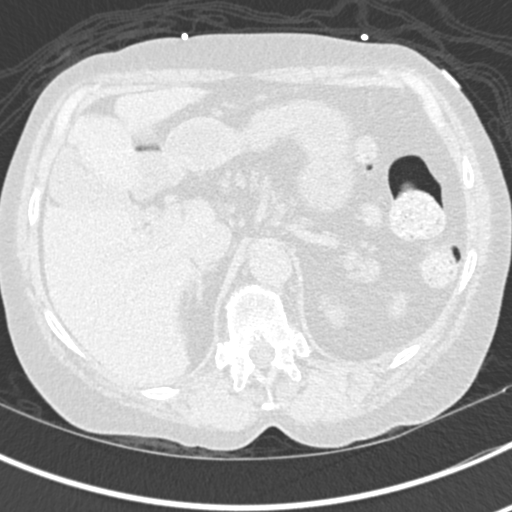
[im 23/150  lung]
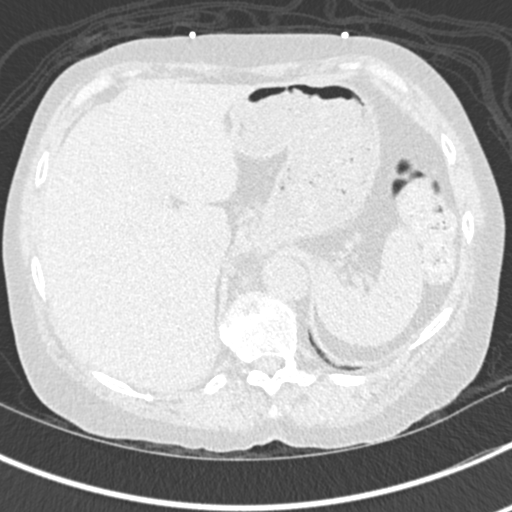
[im 34/150  lung]
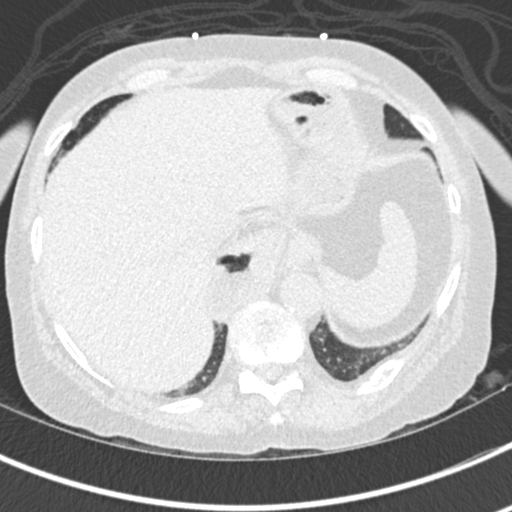
[im 45/150  lung]
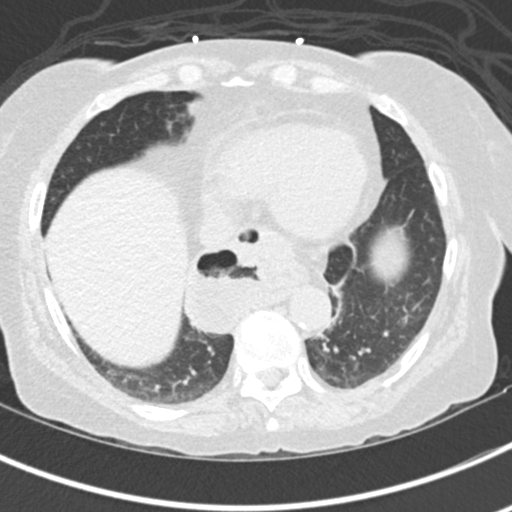
[im 56/150  mediastinal]
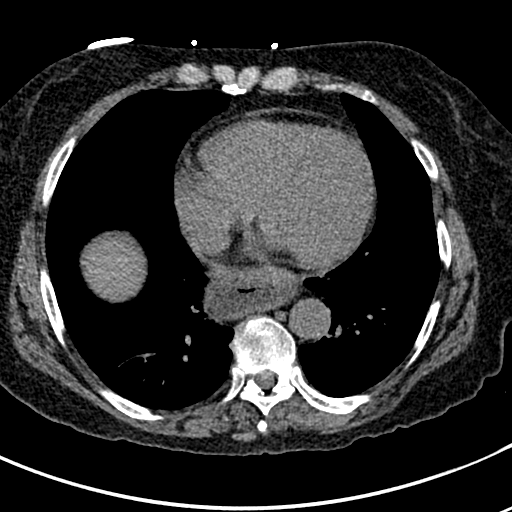
[im 56/150  lung]
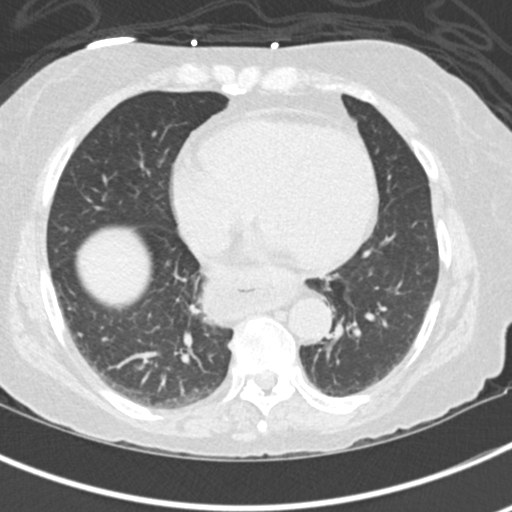
[im 67/150  lung]
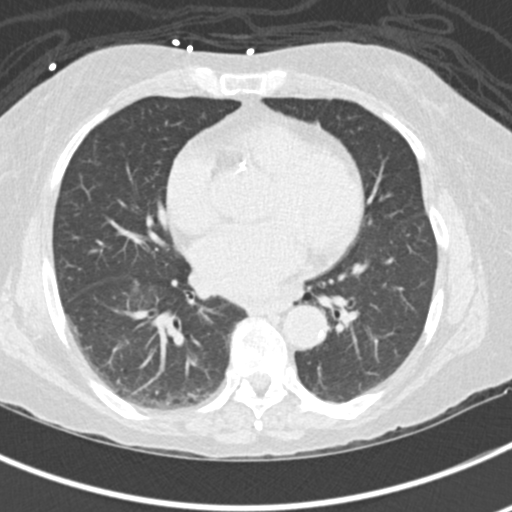
[im 83/150  lung]
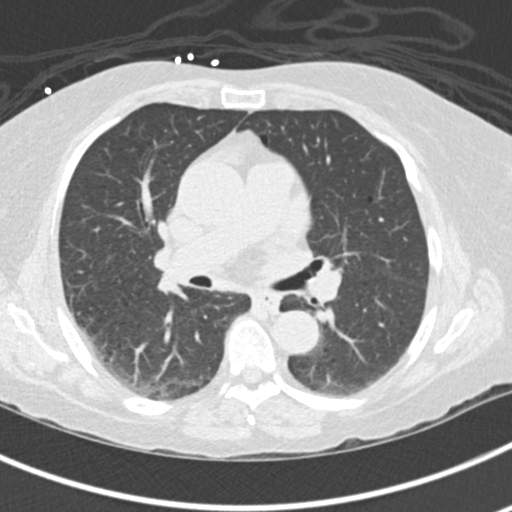
[im 94/150  lung]
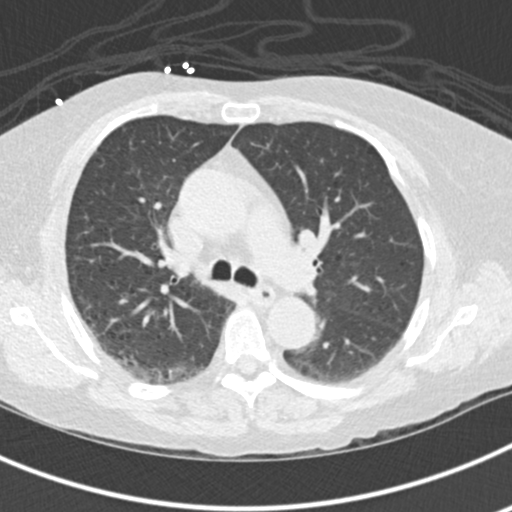
[im 105/150  mediastinal]
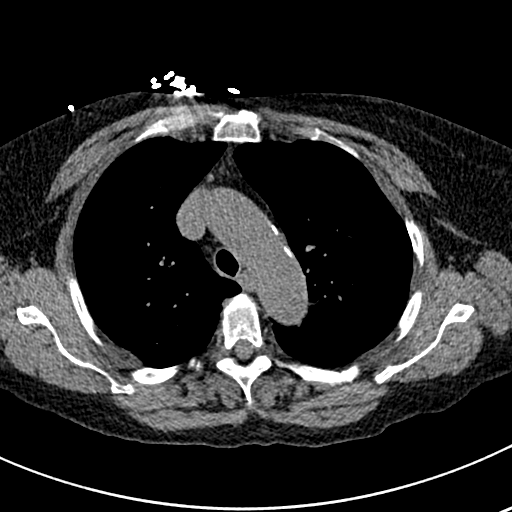
[im 105/150  lung]
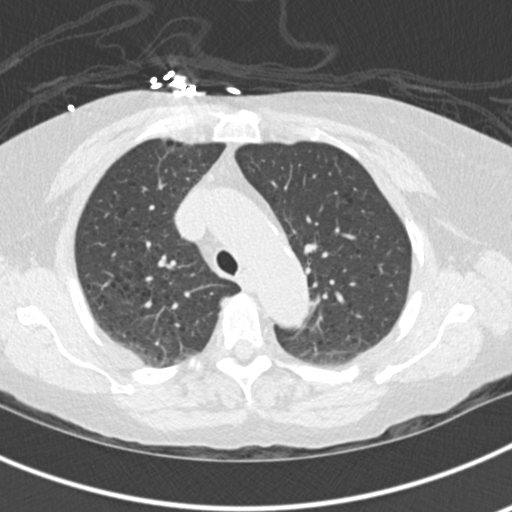
[im 116/150  lung]
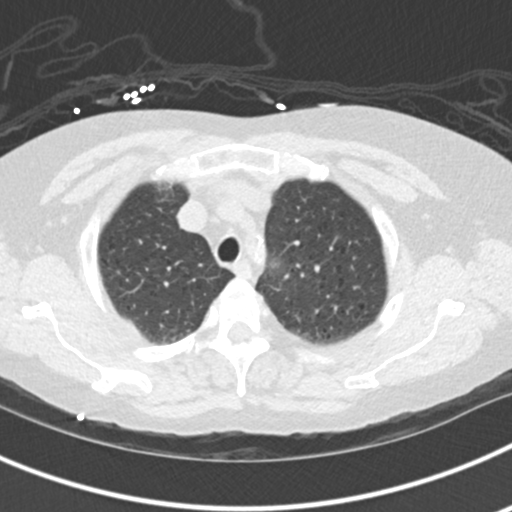
[im 127/150  lung]
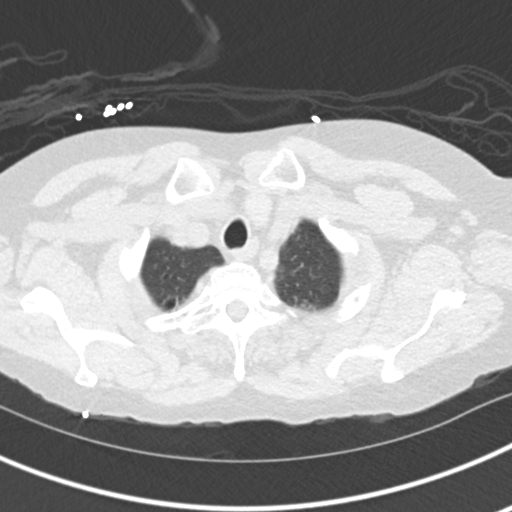
[im 138/150  lung]
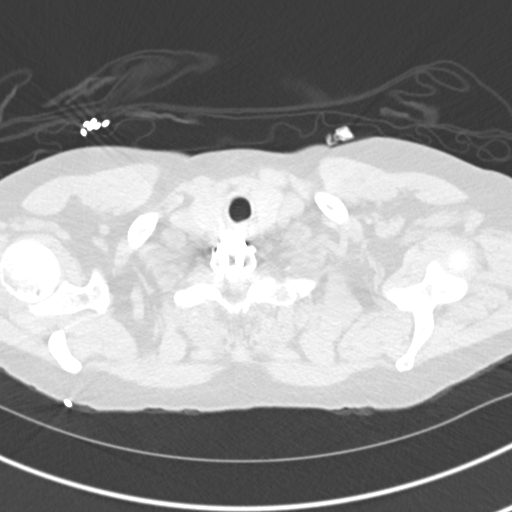

[Series 6: coronal · coronal · 0.59mm/px · 3 of 93 slices shown]
[im 19/93  lung]
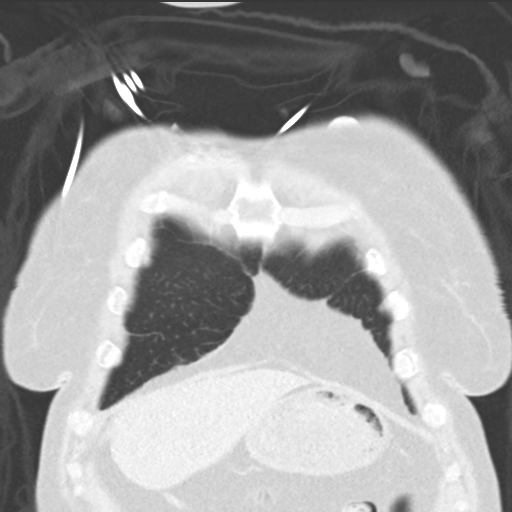
[im 37/93  lung]
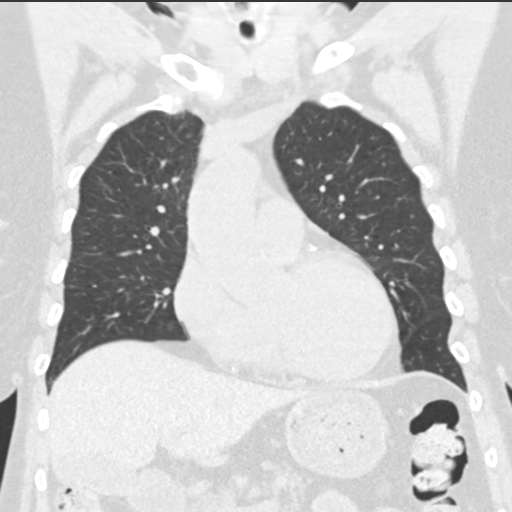
[im 56/93  lung]
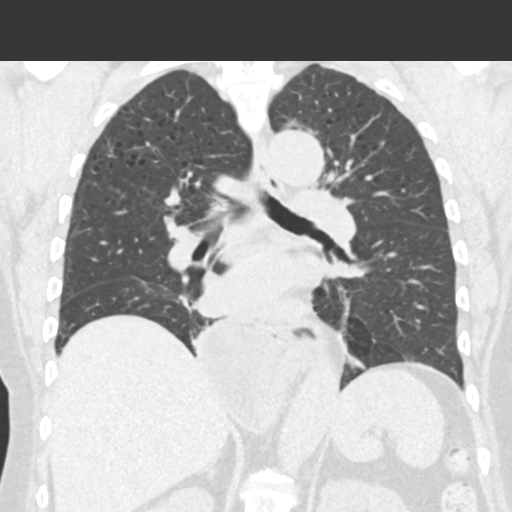

[15 of 36 positions shown; findings below may reference images not displayed]

FINDINGS: Cardiovascular: Heart is normal in size.  No pericardial effusion.

No evidence of thoracic aortic aneurysm. Atherosclerotic
calcifications aortic arch.

Coronary atherosclerosis the LAD and right coronary artery.

Mediastinum/Nodes: No suspicious mediastinal lymphadenopathy.

Visualized thyroid is unremarkable.

Lungs/Pleura: Mild centrilobular emphysematous changes, upper lobe
predominant.

Mild dependent atelectasis in the bilateral upper and lower lobes.

No focal consolidation.

No pleural effusion or pneumothorax.

Upper Abdomen: Visualized upper abdomen is notable for a moderate
hiatal hernia.

Musculoskeletal: Mild degenerative changes of the visualized
thoracolumbar spine. Cervical spine fixation hardware, incompletely
visualized.

Old left posterior 8th and 9th rib fracture deformities.
IMPRESSION: No evidence of acute cardiopulmonary disease.

Aortic Atherosclerosis (OPGZR-RWX.X) and Emphysema ([id number].R).

## 2018-07-28 ENCOUNTER — Other Ambulatory Visit: Payer: Self-pay

## 2018-07-28 ENCOUNTER — Emergency Department (HOSPITAL_COMMUNITY): Payer: Medicaid Other

## 2018-07-28 ENCOUNTER — Encounter (HOSPITAL_COMMUNITY): Payer: Self-pay | Admitting: Emergency Medicine

## 2018-07-28 ENCOUNTER — Emergency Department (HOSPITAL_COMMUNITY)
Admission: EM | Admit: 2018-07-28 | Discharge: 2018-07-28 | Disposition: A | Discharge status: Home / Self Care | Payer: Medicaid Other | Attending: Emergency Medicine | Admitting: Emergency Medicine

## 2018-07-28 DIAGNOSIS — J449 Chronic obstructive pulmonary disease, unspecified: Secondary | ICD-10-CM | POA: Diagnosis not present

## 2018-07-28 DIAGNOSIS — W07XXXA Fall from chair, initial encounter: Secondary | ICD-10-CM | POA: Diagnosis not present

## 2018-07-28 DIAGNOSIS — E1122 Type 2 diabetes mellitus with diabetic chronic kidney disease: Secondary | ICD-10-CM | POA: Diagnosis not present

## 2018-07-28 DIAGNOSIS — M545 Low back pain, unspecified: Secondary | ICD-10-CM

## 2018-07-28 DIAGNOSIS — I129 Hypertensive chronic kidney disease with stage 1 through stage 4 chronic kidney disease, or unspecified chronic kidney disease: Secondary | ICD-10-CM | POA: Insufficient documentation

## 2018-07-28 DIAGNOSIS — F17210 Nicotine dependence, cigarettes, uncomplicated: Secondary | ICD-10-CM | POA: Diagnosis not present

## 2018-07-28 DIAGNOSIS — Z79899 Other long term (current) drug therapy: Secondary | ICD-10-CM | POA: Insufficient documentation

## 2018-07-28 DIAGNOSIS — Z880 Allergy status to penicillin: Secondary | ICD-10-CM | POA: Insufficient documentation

## 2018-07-28 DIAGNOSIS — N183 Chronic kidney disease, stage 3 (moderate): Secondary | ICD-10-CM | POA: Diagnosis not present

## 2018-07-28 MED ORDER — ACETAMINOPHEN 325 MG PO TABS: 650.0000 mg | ORAL_TABLET | Freq: Once | ORAL | Status: DC

## 2018-07-28 MED ORDER — HYDROCODONE-ACETAMINOPHEN 5-325 MG PO TABS
1.0000 | ORAL_TABLET | Freq: Once | ORAL | Status: AC
  Administered 2018-07-28: 1 via ORAL
  Filled 2018-07-28: qty 1

## 2018-07-28 MED ORDER — IBUPROFEN 800 MG PO TABS: 800.0000 mg | ORAL_TABLET | Freq: Once | ORAL | Status: DC
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
## 2018-07-28 NOTE — ED Notes (Signed)
Patient transported to X-ray 

## 2018-07-28 NOTE — Discharge Instructions (Addendum)
You have been seen today for back pain. Please read and follow all provided instructions.   1. Medications: tylenol for pain, usual home medications 2. Treatment: rest, drink plenty of fluids, apply ice 3. Follow Up: Please follow up with your primary doctor in 2-5 days for discussion of your diagnoses and further evaluation after today's visit; if you do not have a primary care doctor use the resource guide provided to find one; Please return to the ER for any new or worsening symptoms. Please obtain all of your results from medical records or have your doctors office obtain the results - share them with your doctor - you should be seen at your doctors office. Call today to arrange your follow up.   Take medications as prescribed. Please review all of the medicines and only take them if you do not have an allergy to them. Return to the emergency room for worsening condition or new concerning symptoms. Follow up with your regular doctor. If you don't have a regular doctor use one of the numbers below to establish a primary care doctor.  Please be aware that if you are taking birth control pills, taking other prescriptions, ESPECIALLY ANTIBIOTICS may make the birth control ineffective - if this is the case, either do not engage in sexual activity or use alternative methods of birth control such as condoms until you have finished the medicine and your family doctor says it is OK to restart them. If you are on a blood thinner such as COUMADIN, be aware that any other medicine that you take may cause the coumadin to either work too much, or not enough - you should have your coumadin level rechecked in next 7 days if this is the case.  ?  It is also a possibility that you have an allergic reaction to any of the medicines that you have been prescribed - Everybody reacts differently to medications and while MOST people have no trouble with most medicines, you may have a reaction such as nausea, vomiting, rash,  swelling, shortness of breath. If this is the case, please stop taking the medicine immediately and contact your physician.  ?  You should return to the ER if you develop severe or worsening symptoms.   Emergency Department Resource Guide 1) Find a Doctor and Pay Out of Pocket Although you won't have to find out who is covered by your insurance plan, it is a good idea to ask around and get recommendations. You will then need to call the office and see if the doctor you have chosen will accept you as a new patient and what types of options they offer for patients who are self-pay. Some doctors offer discounts or will set up payment plans for their patients who do not have insurance, but you will need to ask so you aren't surprised when you get to your appointment.  2) Contact Your Local Health Department Not all health departments have doctors that can see patients for sick visits, but many do, so it is worth a call to see if yours does. If you don't know where your local health department is, you can check in your phone book. The CDC also has a tool to help you locate your state's health department, and many state websites also have listings of all of their local health departments.  3) Find a [facility] If your illness is not likely to be very severe or complicated, you may want to try a walk in clinic. These  are popping up all over the country in pharmacies, drugstores, and shopping centers. They're usually staffed by nurse practitioners or physician assistants that have been trained to treat common illnesses and complaints. They're usually fairly quick and inexpensive. However, if you have serious medical issues or chronic medical problems, these are probably not your best option.  No Primary Care Doctor: Call Health Connect at  [phone] - they can help you locate a primary care doctor that  accepts your insurance, provides certain services, etc. Physician Referral Service-  [phone]  Chronic Pain Problems: Organization         Address  Phone   Notes  [facility]  [phone] Patients need to be referred by their primary care doctor.   Medication Assistance: Organization         Address  Phone   Notes  [facility] Medication [facility] [address] [place], [place] [address] [phone] --Must be a resident of [facility] -- Must have NO insurance coverage whatsoever (no Medicaid/ Medicare, etc.) -- The pt. MUST have a primary care doctor that directs their care regularly and follows them in the community   [facility]  [phone]   [employer]  [phone]    Agencies that provide inexpensive medical care: Organization         Address  Phone   Notes  [place]  [phone]   [facility]    [phone]   [facility] [place], [place] [address] [phone]   [place] [address], [place] [phone]   [facility]    [phone]   [facility]    [phone]   [place] [address], [place] Phone:  [phone], Fax:  [phone] Hours of Operation:  9 am - 6 pm, M-F.  Also accepts Medicaid/Medicare and self-pay.  [facility] for [place] [address], [address], [place] Phone: [phone], Fax: [phone]. Hours of Operation:  8:30 am - 5:30 pm, M-F.  Also accepts Medicaid and self-pay.  [facility] [address] [place] Phone: [phone]   [place], [place], [place] [phone], Ext. [phone] Mondays & Thursdays: 7-9 AM.  First 15 patients are seen on a first come, first serve basis.    [place] Providers:  Organization         Address  Phone   Notes  [facility] [address],  [place] [phone] Also accepts self-pay patients.  [facility] [address], [place]  [phone]   [place], [address], [place] [phone]   [facility] [address] [place] [phone]   Lucianne Lei [address], [place]   [phone] Only accepts [place] [facility] [place] patients after they have their name applied to their card.   Self-Pay (no insurance) in [facility]:  Organization         Address  Phone   Notes  Sickle Cell Patients, [facility] [place] [phone]   [facility] [place] [phone]   [facility] [place]  [phone]  [address], [place] [phone]   [facility]/Dr. Osei-Bonsu  [address] [place] or [address], [place] [phone] Phone number for both [place] and [place] locations is the same.  [facility]gent [facility] and [facility] [address] [place] [phone]   [facility] [address], [place] or [address] [phone]   [facility] [address] [place] [phone], phone; [phone], fax Sees patients 1st and 3rd Saturday of every month.  Must not qualify for public or private insurance (i.e. Medicaid, Medicare, [place] [employer] Choice, Veterans' Benefits)  Household income should be no more than 200% of the poverty level The clinic cannot treat you if you are pregnant or think you are pregnant  Sexually transmitted diseases are not treated at the clinic.

## 2018-07-28 NOTE — ED Triage Notes (Signed)
Pt reports that 3 days ago thought she sitting down on arm of chair but sat on arm and hurt her tailbone.

## 2018-07-28 NOTE — ED Notes (Signed)
EDP at bedside  

## 2018-07-28 NOTE — ED Notes (Addendum)
Pt d/c home per MD order. Discharge summary reviewed, pt verbalizes understanding. No s/s of acute distress noted. Informs this nurse she has a ride home.

## 2018-09-14 ENCOUNTER — Other Ambulatory Visit: Payer: Self-pay | Admitting: Otolaryngology

## 2018-09-14 DIAGNOSIS — K118 Other diseases of salivary glands: Secondary | ICD-10-CM

## 2018-09-18 IMAGING — CR DG CERVICAL SPINE COMPLETE 4+V
6 series · 6 of 6 positions shown · non-contrast
Comparison: MRI 10/26/2016.  Cervical spine 09/02/2015.

CLINICAL DATA: Neck pain.  No recent injury.  Prior fusion.

EXAM:
CERVICAL SPINE - COMPLETE 4+ VIEW

[w cervical spine lat]
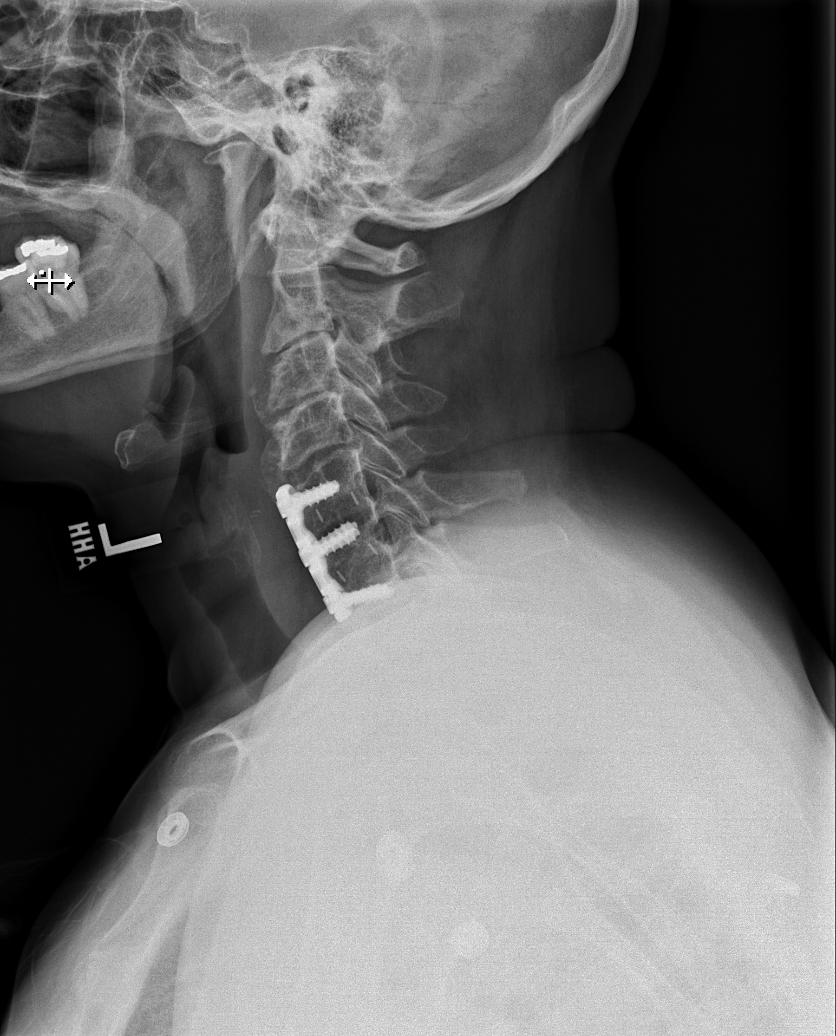

[w cervical spine ap_obl (1 of 2)]
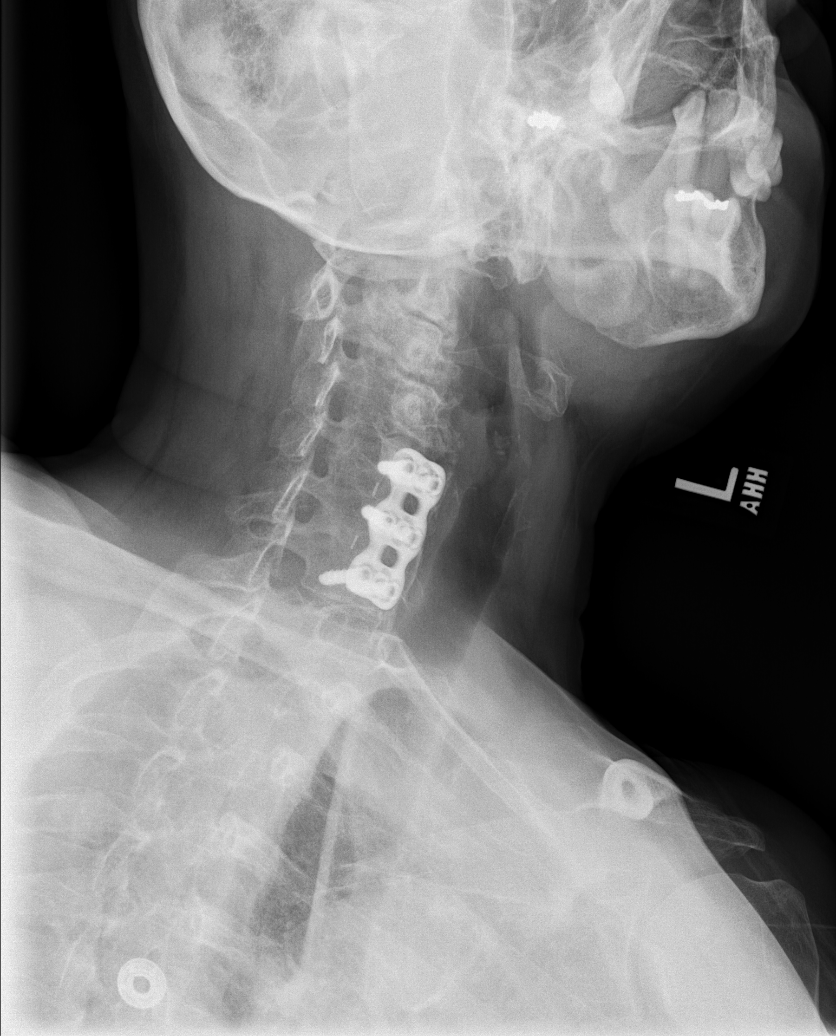

[w cervical spine ap_obl (2 of 2)]
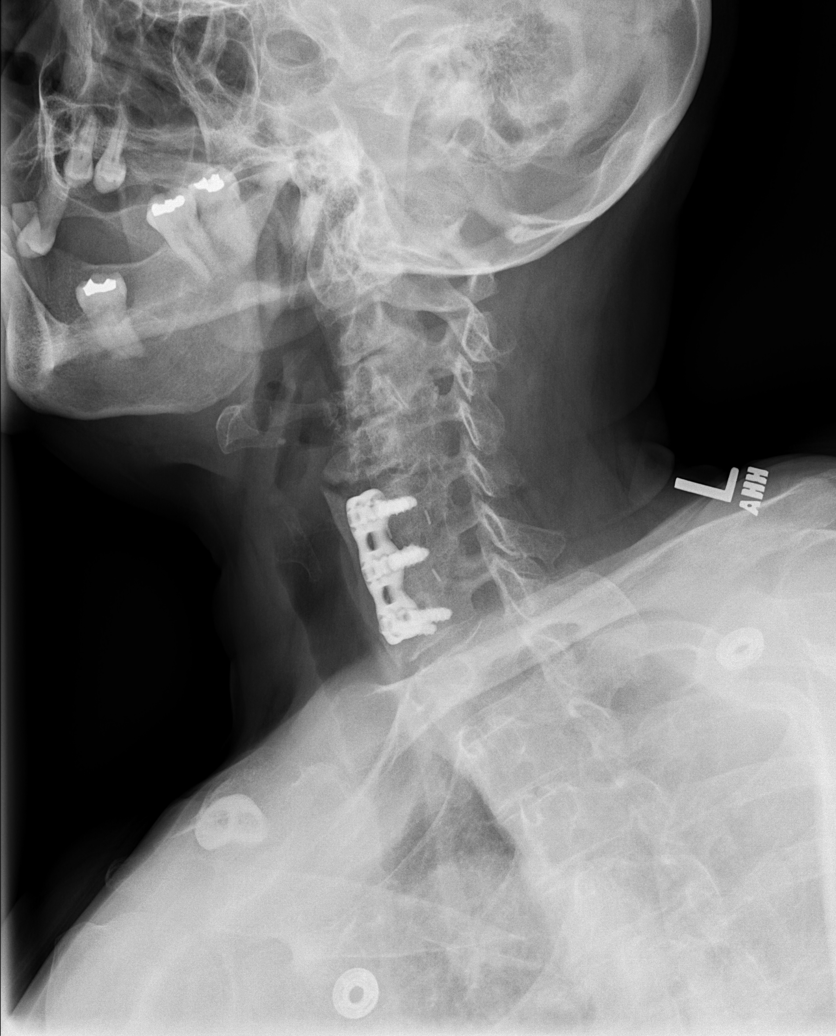

[w cervical spine ap]
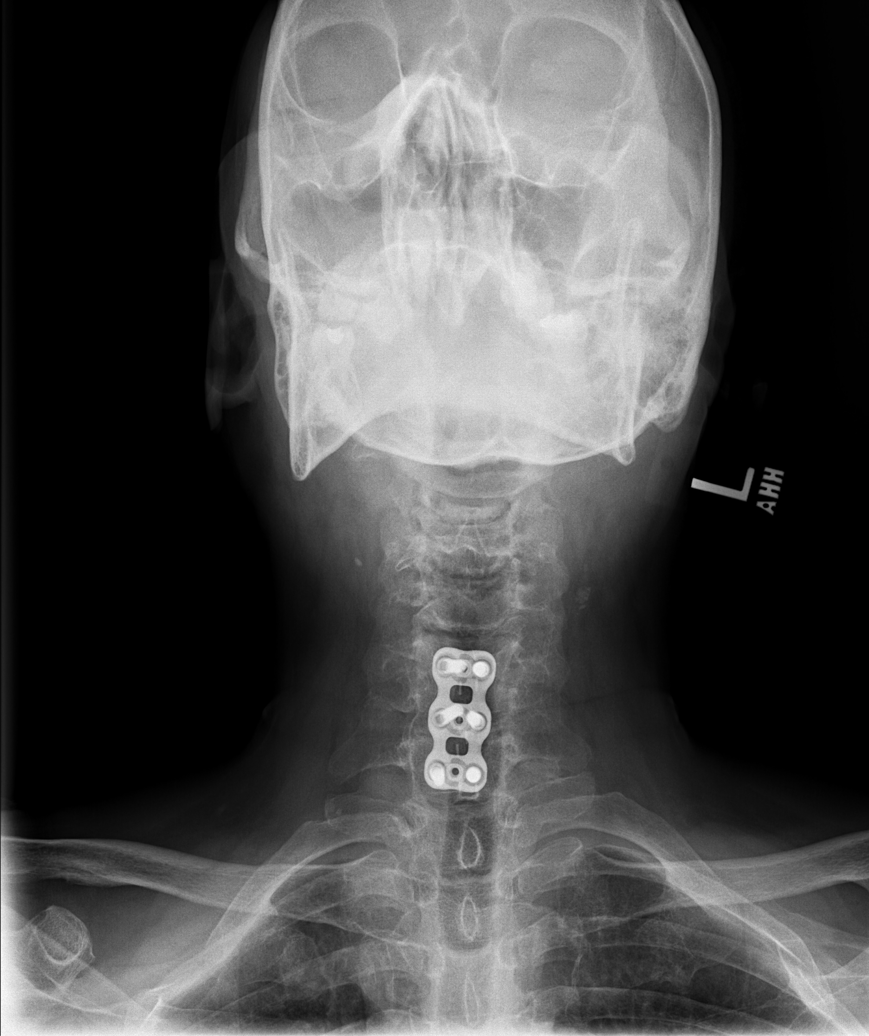

[w cervical spine odontoid]
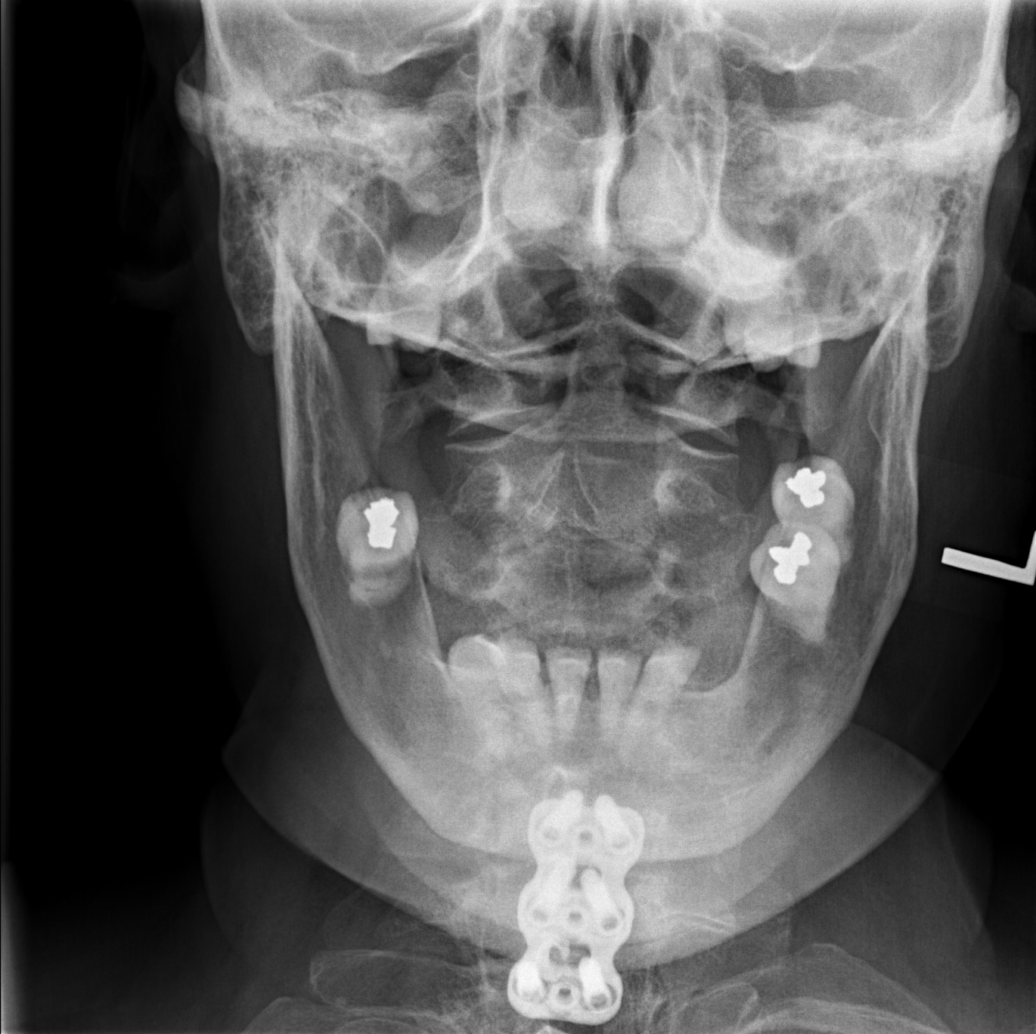

[w cervical swimmers]
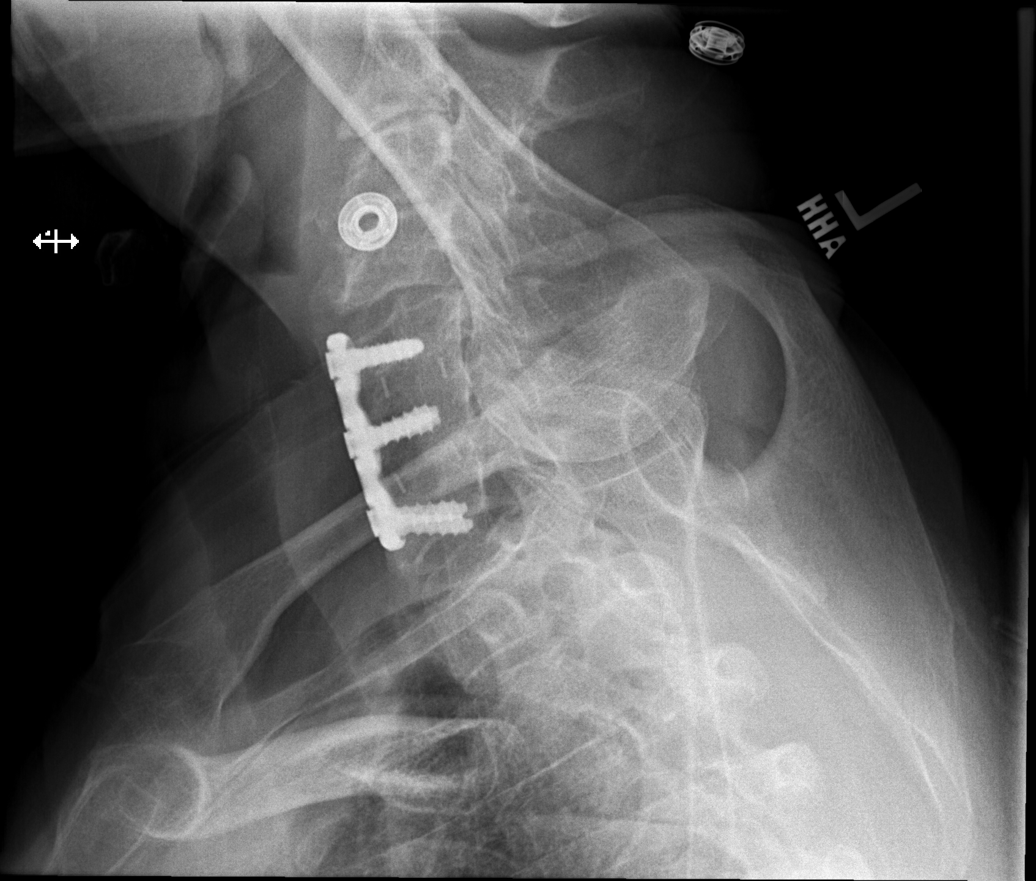

[6 of 6 positions shown; findings below may reference images not displayed]

FINDINGS: Diffuse multilevel degenerative change. C5-6 through C7 anterior
interbody fusion with anatomic alignment. Hardware intact. Biapical
pleural thickening consistent scarring. Bilateral carotid vascular
calcification.
IMPRESSION: 1. C5 through C7 anterior interbody fusion with anatomic alignment.
Hardware intact. Diffuse degenerative change. No acute abnormality.

2.  Carotid vascular disease.

## 2018-09-25 ENCOUNTER — Other Ambulatory Visit: Payer: Self-pay | Admitting: Otolaryngology

## 2018-09-25 DIAGNOSIS — K118 Other diseases of salivary glands: Secondary | ICD-10-CM

## 2018-09-26 ENCOUNTER — Other Ambulatory Visit: Payer: Self-pay | Admitting: Cardiology

## 2018-09-27 ENCOUNTER — Other Ambulatory Visit (HOSPITAL_COMMUNITY): Payer: Self-pay | Admitting: Otolaryngology

## 2018-09-27 DIAGNOSIS — K118 Other diseases of salivary glands: Secondary | ICD-10-CM

## 2018-10-04 ENCOUNTER — Inpatient Hospital Stay: Admission: RE | Admit: 2018-10-04 | Payer: Medicaid Other | Source: Ambulatory Visit

## 2018-10-04 ENCOUNTER — Other Ambulatory Visit: Payer: Self-pay | Admitting: Internal Medicine

## 2018-10-04 DIAGNOSIS — E2839 Other primary ovarian failure: Secondary | ICD-10-CM

## 2018-10-05 ENCOUNTER — Other Ambulatory Visit: Payer: Self-pay | Admitting: Radiology

## 2018-10-06 ENCOUNTER — Other Ambulatory Visit: Payer: Self-pay | Admitting: Student

## 2018-10-09 ENCOUNTER — Other Ambulatory Visit: Payer: Self-pay

## 2018-10-09 ENCOUNTER — Ambulatory Visit (HOSPITAL_COMMUNITY)
Admission: RE | Admit: 2018-10-09 | Discharge: 2018-10-09 | Disposition: A | Discharge status: Home / Self Care | Payer: Medicaid Other | Source: Ambulatory Visit | Attending: Otolaryngology | Admitting: Otolaryngology

## 2018-10-09 DIAGNOSIS — F17210 Nicotine dependence, cigarettes, uncomplicated: Secondary | ICD-10-CM | POA: Insufficient documentation

## 2018-10-09 DIAGNOSIS — Z825 Family history of asthma and other chronic lower respiratory diseases: Secondary | ICD-10-CM | POA: Insufficient documentation

## 2018-10-09 DIAGNOSIS — M069 Rheumatoid arthritis, unspecified: Secondary | ICD-10-CM | POA: Insufficient documentation

## 2018-10-09 DIAGNOSIS — N183 Chronic kidney disease, stage 3 (moderate): Secondary | ICD-10-CM | POA: Insufficient documentation

## 2018-10-09 DIAGNOSIS — Z79899 Other long term (current) drug therapy: Secondary | ICD-10-CM | POA: Insufficient documentation

## 2018-10-09 DIAGNOSIS — K219 Gastro-esophageal reflux disease without esophagitis: Secondary | ICD-10-CM | POA: Diagnosis not present

## 2018-10-09 DIAGNOSIS — Z888 Allergy status to other drugs, medicaments and biological substances status: Secondary | ICD-10-CM | POA: Diagnosis not present

## 2018-10-09 DIAGNOSIS — Z90710 Acquired absence of both cervix and uterus: Secondary | ICD-10-CM | POA: Insufficient documentation

## 2018-10-09 DIAGNOSIS — F1411 Cocaine abuse, in remission: Secondary | ICD-10-CM | POA: Diagnosis not present

## 2018-10-09 DIAGNOSIS — Z7952 Long term (current) use of systemic steroids: Secondary | ICD-10-CM | POA: Insufficient documentation

## 2018-10-09 DIAGNOSIS — Z91041 Radiographic dye allergy status: Secondary | ICD-10-CM | POA: Diagnosis not present

## 2018-10-09 DIAGNOSIS — Z886 Allergy status to analgesic agent status: Secondary | ICD-10-CM | POA: Insufficient documentation

## 2018-10-09 DIAGNOSIS — Z881 Allergy status to other antibiotic agents status: Secondary | ICD-10-CM | POA: Diagnosis not present

## 2018-10-09 DIAGNOSIS — J449 Chronic obstructive pulmonary disease, unspecified: Secondary | ICD-10-CM | POA: Insufficient documentation

## 2018-10-09 DIAGNOSIS — K118 Other diseases of salivary glands: Secondary | ICD-10-CM | POA: Diagnosis not present

## 2018-10-09 DIAGNOSIS — Z8249 Family history of ischemic heart disease and other diseases of the circulatory system: Secondary | ICD-10-CM | POA: Insufficient documentation

## 2018-10-09 DIAGNOSIS — I129 Hypertensive chronic kidney disease with stage 1 through stage 4 chronic kidney disease, or unspecified chronic kidney disease: Secondary | ICD-10-CM | POA: Insufficient documentation

## 2018-10-09 DIAGNOSIS — M4322 Fusion of spine, cervical region: Secondary | ICD-10-CM | POA: Insufficient documentation

## 2018-10-09 LAB — CBC
HCT: 38.8 % (ref 36.0–46.0)
Hemoglobin: 12.4 g/dL (ref 12.0–15.0)
MCH: 29.8 pg (ref 26.0–34.0)
MCHC: 32 g/dL (ref 30.0–36.0)
MCV: 93.3 fL (ref 80.0–100.0)
Platelets: 322 10*3/uL (ref 150–400)
RBC: 4.16 MIL/uL (ref 3.87–5.11)
RDW: 14.9 % (ref 11.5–15.5)
WBC: 6.1 10*3/uL (ref 4.0–10.5)
nRBC: 0 % (ref 0.0–0.2)

## 2018-10-09 LAB — PROTIME-INR
INR: 0.9 (ref 0.8–1.2)
Prothrombin Time: 12.4 seconds (ref 11.4–15.2)

## 2018-10-09 MED ORDER — MIDAZOLAM HCL 2 MG/2ML IJ SOLN
INTRAMUSCULAR | Status: AC | PRN
  Administered 2018-10-09: 1 mg via INTRAVENOUS

## 2018-10-09 MED ORDER — SODIUM CHLORIDE 0.9 % IV SOLN: INTRAVENOUS | Status: DC

## 2018-10-09 MED ORDER — LIDOCAINE HCL (PF) 1 % IJ SOLN
INTRAMUSCULAR | Status: AC
  Filled 2018-10-09: qty 30

## 2018-10-09 MED ORDER — MIDAZOLAM HCL 2 MG/2ML IJ SOLN
INTRAMUSCULAR | Status: AC
  Filled 2018-10-09: qty 2

## 2018-10-09 MED ORDER — FENTANYL CITRATE (PF) 100 MCG/2ML IJ SOLN
INTRAMUSCULAR | Status: AC
  Filled 2018-10-09: qty 2

## 2018-10-09 MED ORDER — FENTANYL CITRATE (PF) 100 MCG/2ML IJ SOLN
INTRAMUSCULAR | Status: AC | PRN
  Administered 2018-10-09: 25 ug via INTRAVENOUS
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
## 2018-10-09 NOTE — Progress Notes (Signed)
Patient at this time no longer needs PIV started, IR team was able to establish access.

## 2018-10-09 NOTE — Discharge Instructions (Addendum)
Moderate Conscious Sedation, Adult, Care After °These instructions provide you with information about caring for yourself after your procedure. Your health care provider may also give you more specific instructions. Your treatment has been planned according to current medical practices, but problems sometimes occur. Call your health care provider if you have any problems or questions after your procedure. °What can I expect after the procedure? °After your procedure, it is common: °· To feel sleepy for several hours. °· To feel clumsy and have poor balance for several hours. °· To have poor judgment for several hours. °· To vomit if you eat too soon. °Follow these instructions at home: °For at least 24 hours after the procedure: ° °· Do not: °? Participate in activities where you could fall or become injured. °? Drive. °? Use heavy machinery. °? Drink alcohol. °? Take sleeping pills or medicines that cause drowsiness. °? Make important decisions or sign legal documents. °? Take care of children on your own. °· Rest. °Eating and drinking °· Follow the diet recommended by your health care provider. °· If you vomit: °? Drink water, juice, or soup when you can drink without vomiting. °? Make sure you have little or no nausea before eating solid foods. °General instructions °· Have a responsible adult stay with you until you are awake and alert. °· Take over-the-counter and prescription medicines only as told by your health care provider. °· If you smoke, do not smoke without supervision. °· Keep all follow-up visits as told by your health care provider. This is important. °Contact a health care provider if: °· You keep feeling nauseous or you keep vomiting. °· You feel light-headed. °· You develop a rash. °· You have a fever. °Get help right away if: °· You have trouble breathing. °This information is not intended to replace advice given to you by your health care provider. Make sure you discuss any questions you have  with your health care provider. °Document Released: 10/18/2012 Document Revised: 12/10/2016 Document Reviewed: 04/19/2015 °Elsevier Patient Education © 2020 [employer] °Needle Biopsy, Care After °This sheet gives you information about how to care for yourself after your procedure. Your health care provider may also give you more specific instructions. If you have problems or questions, contact your health care provider. °What can I expect after the procedure? °After the procedure, it is common to have soreness, bruising, or mild pain at the puncture site. This should go away in a few days. °Follow these instructions at home: °Needle insertion site care ° °· Wash your hands with soap and water before you change your bandage (dressing). If you cannot use soap and water, use hand sanitizer. °· Follow instructions from your health care provider about how to take care of your puncture site. This includes: °? When and how to change your dressing. °? When to remove your dressing. °· Check your puncture site every day for signs of infection. Check for: °? Redness, swelling, or pain. °? Fluid or blood. °? Pus or a bad smell. °? Warmth. °General instructions °· Return to your normal activities as told by your health care provider. Ask your health care provider what activities are safe for you. °· Do not take baths, swim, or use a hot tub until your health care provider approves. Ask your health care provider if you may take showers. You may only be allowed to take sponge baths. °· Take over-the-counter and prescription medicines only as told by your health care provider. °· Keep all follow-up   visits as told by your health care provider. This is important. °Contact a health care provider if: °· You have a fever. °· You have redness, swelling, or pain at the puncture site that lasts longer than a few days. °· You have fluid, blood, or pus coming from your puncture site. °· Your puncture site feels warm to the touch. °Get  help right away if: °· You have severe bleeding from the puncture site. °Summary °· After the procedure, it is common to have soreness, bruising, or mild pain at the puncture site. This should go away in a few days. °· Check your puncture site every day for signs of infection, such as redness, swelling, or pain. °· Get help right away if you have severe bleeding from your puncture site. °This information is not intended to replace advice given to you by your health care provider. Make sure you discuss any questions you have with your health care provider. °Document Released: 05/14/2014 Document Revised: 03/11/2017 Document Reviewed: 01/10/2017 °Elsevier Patient Education © 2020 [employer] ° °

## 2018-10-09 NOTE — Procedures (Signed)
Interventional Radiology Procedure Note  Procedure: US guided left parotid mass FNA...  Complications: None Recommendations:  - Ok to shower tomorrow - Do not submerge for 7 days - Routine care   Signed,  Dulcy Fanny. Earleen Newport, DO

## 2018-10-11 LAB — CYTOLOGY - NON PAP

## 2018-10-24 IMAGING — DX DG CHEST 2V
2 series · 2 of 2 positions shown · non-contrast
Comparison: Chest CT 01/06/2017 and earlier.

CLINICAL DATA: 61-year-old female with left side chest pain and
shortness of breath since yesterday.

EXAM:
CHEST - 2 VIEW

[chest ap]
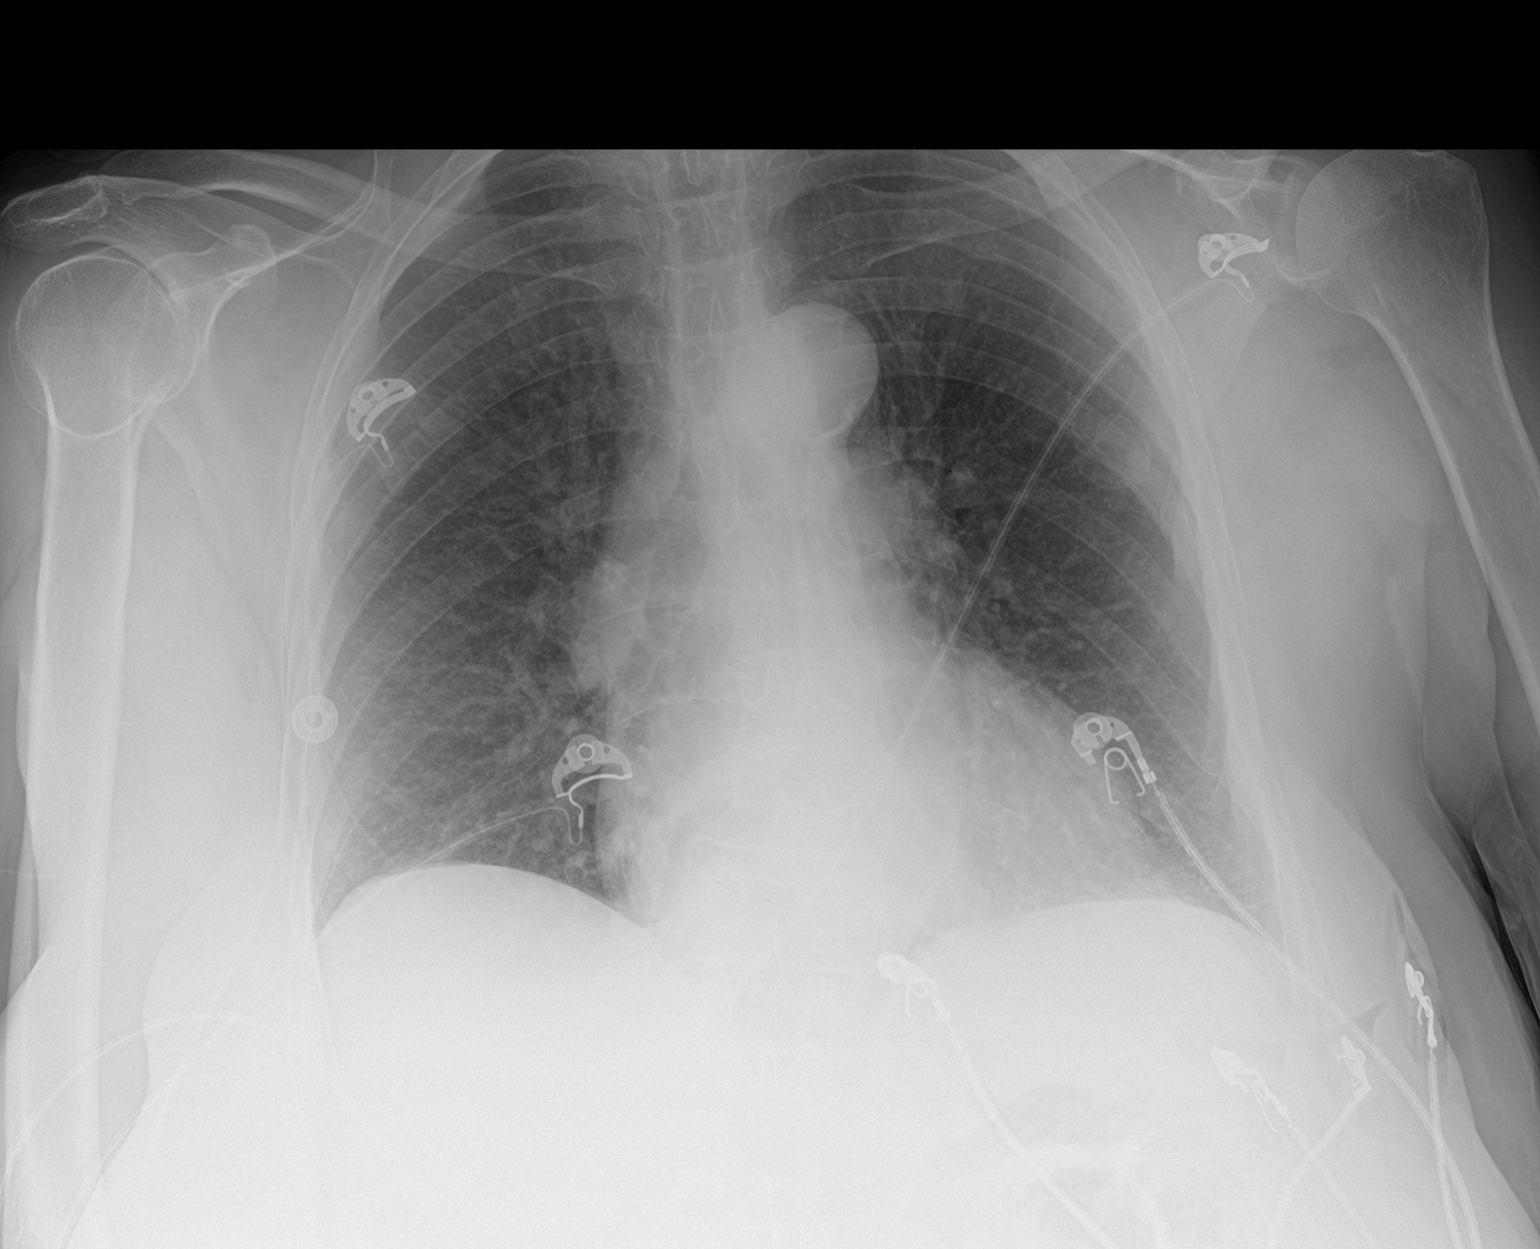

[chest lat]
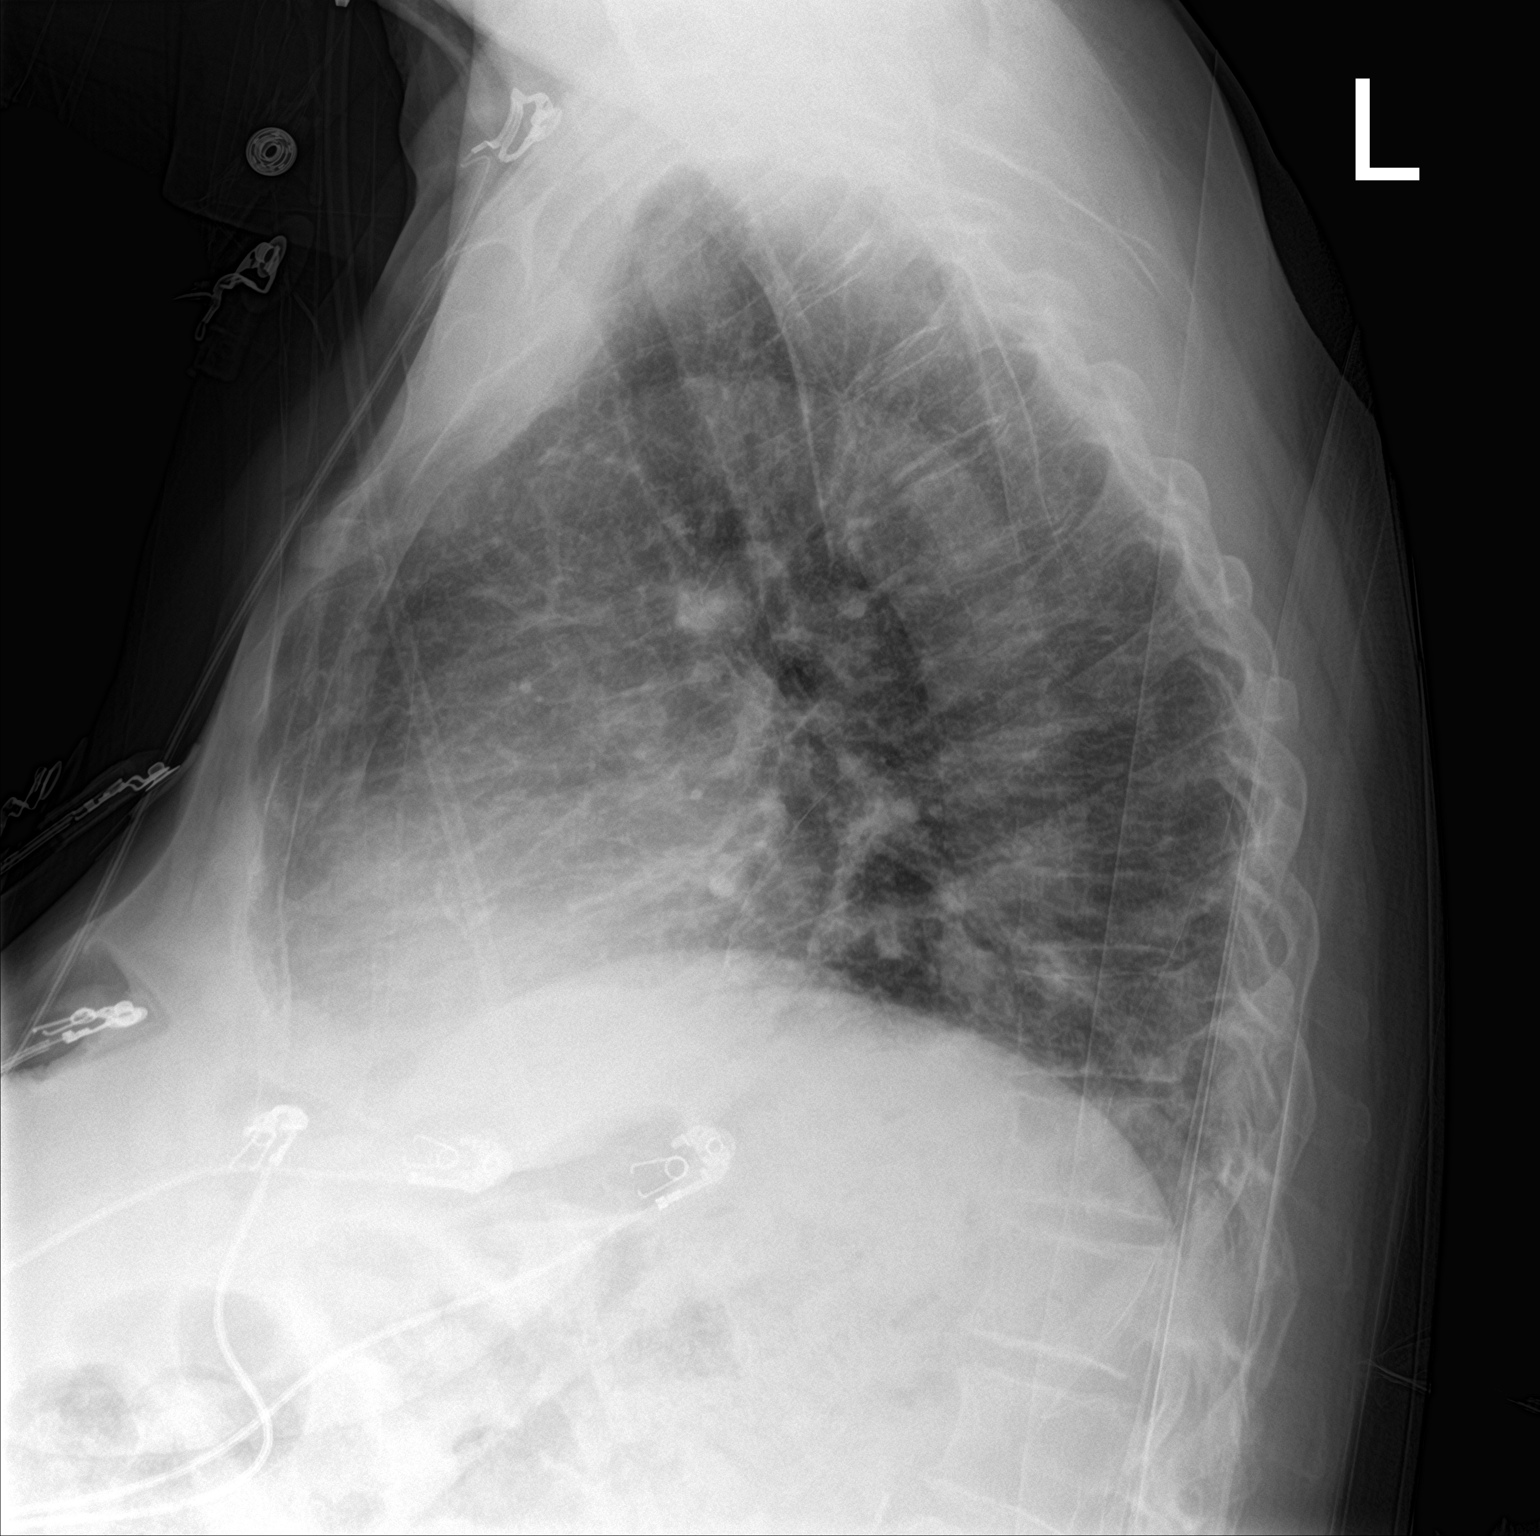

[2 of 2 positions shown; findings below may reference images not displayed]

FINDINGS: Stable cardiac size and mediastinal contours. Moderate gastric
hiatal hernia re-demonstrated. Mildly lower lung volumes since
[REDACTED]. Visualized tracheal air column is within normal limits. No
pneumothorax, pulmonary edema or pleural effusion. Chronic left
posterolateral rib fractures. The mild crowding of lung markings at
the bases. No other confluent opacity. No acute osseous abnormality
identified. Prior cervical ACDF. Negative visible bowel gas pattern.
IMPRESSION: 1. Chronic Emphysema ([id number]). Lower lung volumes since
[REDACTED] with no acute cardiopulmonary abnormality identified.
2. Chronic hiatal hernia.

## 2018-11-30 ENCOUNTER — Other Ambulatory Visit: Payer: Self-pay | Admitting: Interventional Cardiology

## 2019-02-08 ENCOUNTER — Other Ambulatory Visit: Payer: Self-pay | Admitting: Family

## 2019-02-08 DIAGNOSIS — Z13820 Encounter for screening for osteoporosis: Secondary | ICD-10-CM

## 2019-02-08 DIAGNOSIS — Z1231 Encounter for screening mammogram for malignant neoplasm of breast: Secondary | ICD-10-CM

## 2019-04-12 ENCOUNTER — Emergency Department (HOSPITAL_COMMUNITY)
Admission: EM | Admit: 2019-04-12 | Discharge: 2019-04-12 | Disposition: A | Discharge status: Home / Self Care | Payer: Medicare Other | Attending: Emergency Medicine | Admitting: Emergency Medicine

## 2019-04-12 ENCOUNTER — Other Ambulatory Visit: Payer: Self-pay

## 2019-04-12 ENCOUNTER — Encounter (HOSPITAL_COMMUNITY): Payer: Self-pay | Admitting: Emergency Medicine

## 2019-04-12 ENCOUNTER — Emergency Department (HOSPITAL_COMMUNITY): Payer: Medicare Other

## 2019-04-12 DIAGNOSIS — N183 Chronic kidney disease, stage 3 unspecified: Secondary | ICD-10-CM | POA: Insufficient documentation

## 2019-04-12 DIAGNOSIS — M542 Cervicalgia: Secondary | ICD-10-CM

## 2019-04-12 DIAGNOSIS — F17210 Nicotine dependence, cigarettes, uncomplicated: Secondary | ICD-10-CM | POA: Diagnosis not present

## 2019-04-12 DIAGNOSIS — Z79899 Other long term (current) drug therapy: Secondary | ICD-10-CM | POA: Insufficient documentation

## 2019-04-12 DIAGNOSIS — M47812 Spondylosis without myelopathy or radiculopathy, cervical region: Secondary | ICD-10-CM | POA: Insufficient documentation

## 2019-04-12 DIAGNOSIS — I129 Hypertensive chronic kidney disease with stage 1 through stage 4 chronic kidney disease, or unspecified chronic kidney disease: Secondary | ICD-10-CM | POA: Insufficient documentation

## 2019-04-12 DIAGNOSIS — J449 Chronic obstructive pulmonary disease, unspecified: Secondary | ICD-10-CM | POA: Insufficient documentation

## 2019-04-12 MED ORDER — OXYCODONE-ACETAMINOPHEN 5-325 MG PO TABS
1.0000 | ORAL_TABLET | Freq: Once | ORAL | Status: AC
  Administered 2019-04-12: 10:00:00 1 via ORAL
  Filled 2019-04-12: qty 1

## 2019-04-12 MED ORDER — PREDNISONE 20 MG PO TABS: 20.0000 mg | ORAL_TABLET | Freq: Two times a day (BID) | ORAL | 0 refills | Status: DC

## 2019-04-12 MED ORDER — DIAZEPAM 5 MG PO TABS
5.0000 mg | ORAL_TABLET | Freq: Once | ORAL | Status: AC
  Administered 2019-04-12: 5 mg via ORAL
  Filled 2019-04-12: qty 1

## 2019-04-12 MED ORDER — DIAZEPAM 5 MG PO TABS: 5.0000 mg | ORAL_TABLET | Freq: Three times a day (TID) | ORAL | 0 refills | Status: DC | PRN
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
## 2019-04-12 NOTE — ED Notes (Signed)
Discharge paperwork and prescriptions reviewed with pt.  Pt verbalized understanding, reported that she will notify niece that she needs a ride home.

## 2019-04-12 NOTE — Discharge Instructions (Signed)
Use heat on the sore area of your neck.  We are prescribing prednisone and Valium to help improve your discomfort.  Follow-up with your pain doctor or primary care doctor as needed for problems with your neck.  You can also call Dr. Ronnald Ramp, your neurosurgeon for appointment to be seen for further care and treatment.

## 2019-04-12 NOTE — ED Notes (Signed)
Pt provided with hot pack for neck.

## 2019-04-12 NOTE — ED Triage Notes (Signed)
Per pt, states neck pain for 2 days-no injury or trauma-OTC meds not helping

## 2019-04-25 ENCOUNTER — Other Ambulatory Visit: Payer: Medicaid Other

## 2019-04-25 ENCOUNTER — Ambulatory Visit: Payer: Medicaid Other

## 2019-08-15 ENCOUNTER — Ambulatory Visit (HOSPITAL_COMMUNITY)
Admission: EM | Admit: 2019-08-15 | Discharge: 2019-08-15 | Disposition: A | Discharge status: Home / Self Care | Payer: Medicare Other | Attending: Family Medicine | Admitting: Family Medicine

## 2019-08-15 ENCOUNTER — Encounter (HOSPITAL_COMMUNITY): Payer: Self-pay | Admitting: Emergency Medicine

## 2019-08-15 ENCOUNTER — Other Ambulatory Visit: Payer: Self-pay

## 2019-08-15 DIAGNOSIS — G8929 Other chronic pain: Secondary | ICD-10-CM

## 2019-08-15 MED ORDER — KETOROLAC TROMETHAMINE 30 MG/ML IJ SOLN
30.0000 mg | Freq: Once | INTRAMUSCULAR | Status: AC
  Administered 2019-08-15: 30 mg via INTRAMUSCULAR

## 2019-08-15 MED ORDER — HYDROCODONE-ACETAMINOPHEN 5-325 MG PO TABS
ORAL_TABLET | ORAL | Status: AC
  Filled 2019-08-15: qty 1

## 2019-08-15 MED ORDER — HYDROCODONE-ACETAMINOPHEN 5-325 MG PO TABS
1.0000 | ORAL_TABLET | Freq: Once | ORAL | Status: AC
  Administered 2019-08-15: 1 via ORAL

## 2019-08-15 MED ORDER — KETOROLAC TROMETHAMINE 30 MG/ML IJ SOLN
INTRAMUSCULAR | Status: AC
  Filled 2019-08-15: qty 1

## 2019-08-15 NOTE — Discharge Instructions (Addendum)
We treated your pain here today.  You need to follow up with your pain specialist for further pain management.  You can take tylenol or ibuprofen for pain at home as needed.

## 2019-08-15 NOTE — ED Notes (Signed)
Pt did not answer.

## 2019-08-15 NOTE — ED Triage Notes (Signed)
Pt presents with joint pain. States she has pain all over. States she has numbness in hands and feet.
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
## 2019-10-02 ENCOUNTER — Emergency Department (HOSPITAL_BASED_OUTPATIENT_CLINIC_OR_DEPARTMENT_OTHER)
Admission: EM | Admit: 2019-10-02 | Discharge: 2019-10-02 | Disposition: A | Discharge status: Home / Self Care | Payer: Medicare Other | Attending: Emergency Medicine | Admitting: Emergency Medicine

## 2019-10-02 ENCOUNTER — Other Ambulatory Visit: Payer: Self-pay

## 2019-10-02 ENCOUNTER — Encounter (HOSPITAL_BASED_OUTPATIENT_CLINIC_OR_DEPARTMENT_OTHER): Payer: Self-pay | Admitting: Emergency Medicine

## 2019-10-02 DIAGNOSIS — M069 Rheumatoid arthritis, unspecified: Secondary | ICD-10-CM

## 2019-10-02 DIAGNOSIS — F17210 Nicotine dependence, cigarettes, uncomplicated: Secondary | ICD-10-CM | POA: Insufficient documentation

## 2019-10-02 DIAGNOSIS — N183 Chronic kidney disease, stage 3 unspecified: Secondary | ICD-10-CM | POA: Insufficient documentation

## 2019-10-02 DIAGNOSIS — Z79899 Other long term (current) drug therapy: Secondary | ICD-10-CM | POA: Insufficient documentation

## 2019-10-02 DIAGNOSIS — Z7951 Long term (current) use of inhaled steroids: Secondary | ICD-10-CM | POA: Diagnosis not present

## 2019-10-02 DIAGNOSIS — I129 Hypertensive chronic kidney disease with stage 1 through stage 4 chronic kidney disease, or unspecified chronic kidney disease: Secondary | ICD-10-CM | POA: Insufficient documentation

## 2019-10-02 DIAGNOSIS — J449 Chronic obstructive pulmonary disease, unspecified: Secondary | ICD-10-CM | POA: Insufficient documentation

## 2019-10-02 MED ORDER — HYDROMORPHONE HCL 1 MG/ML IJ SOLN
2.0000 mg | Freq: Once | INTRAMUSCULAR | Status: AC
  Administered 2019-10-02: 2 mg via INTRAMUSCULAR
  Filled 2019-10-02: qty 2

## 2019-10-02 MED ORDER — OXYCODONE-ACETAMINOPHEN 10-325 MG PO TABS: 1.0000 | ORAL_TABLET | Freq: Four times a day (QID) | ORAL | 0 refills | Status: DC | PRN

## 2019-10-02 NOTE — ED Triage Notes (Signed)
Pt states her arthritis is acting up really bad  Pt is c/o pain in all her joints and also the top of her head   Pt states pain has been going on for the past two weeks but worse tonight
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
## 2019-11-20 ENCOUNTER — Other Ambulatory Visit: Payer: Self-pay | Admitting: Pain Medicine

## 2019-11-20 ENCOUNTER — Ambulatory Visit
Admission: RE | Admit: 2019-11-20 | Discharge: 2019-11-20 | Disposition: A | Discharge status: Home / Self Care | Payer: Medicare Other | Source: Ambulatory Visit | Attending: Pain Medicine | Admitting: Pain Medicine

## 2019-11-20 ENCOUNTER — Other Ambulatory Visit: Payer: Self-pay

## 2019-11-20 DIAGNOSIS — M542 Cervicalgia: Secondary | ICD-10-CM
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
## 2020-01-03 IMAGING — DX CHEST  1 VIEW
1 series · 1 of 1 positions shown · non-contrast
Comparison: 04/13/2017

CLINICAL DATA: Shortness of breath

EXAM:
CHEST  1 VIEW

[chest ap]
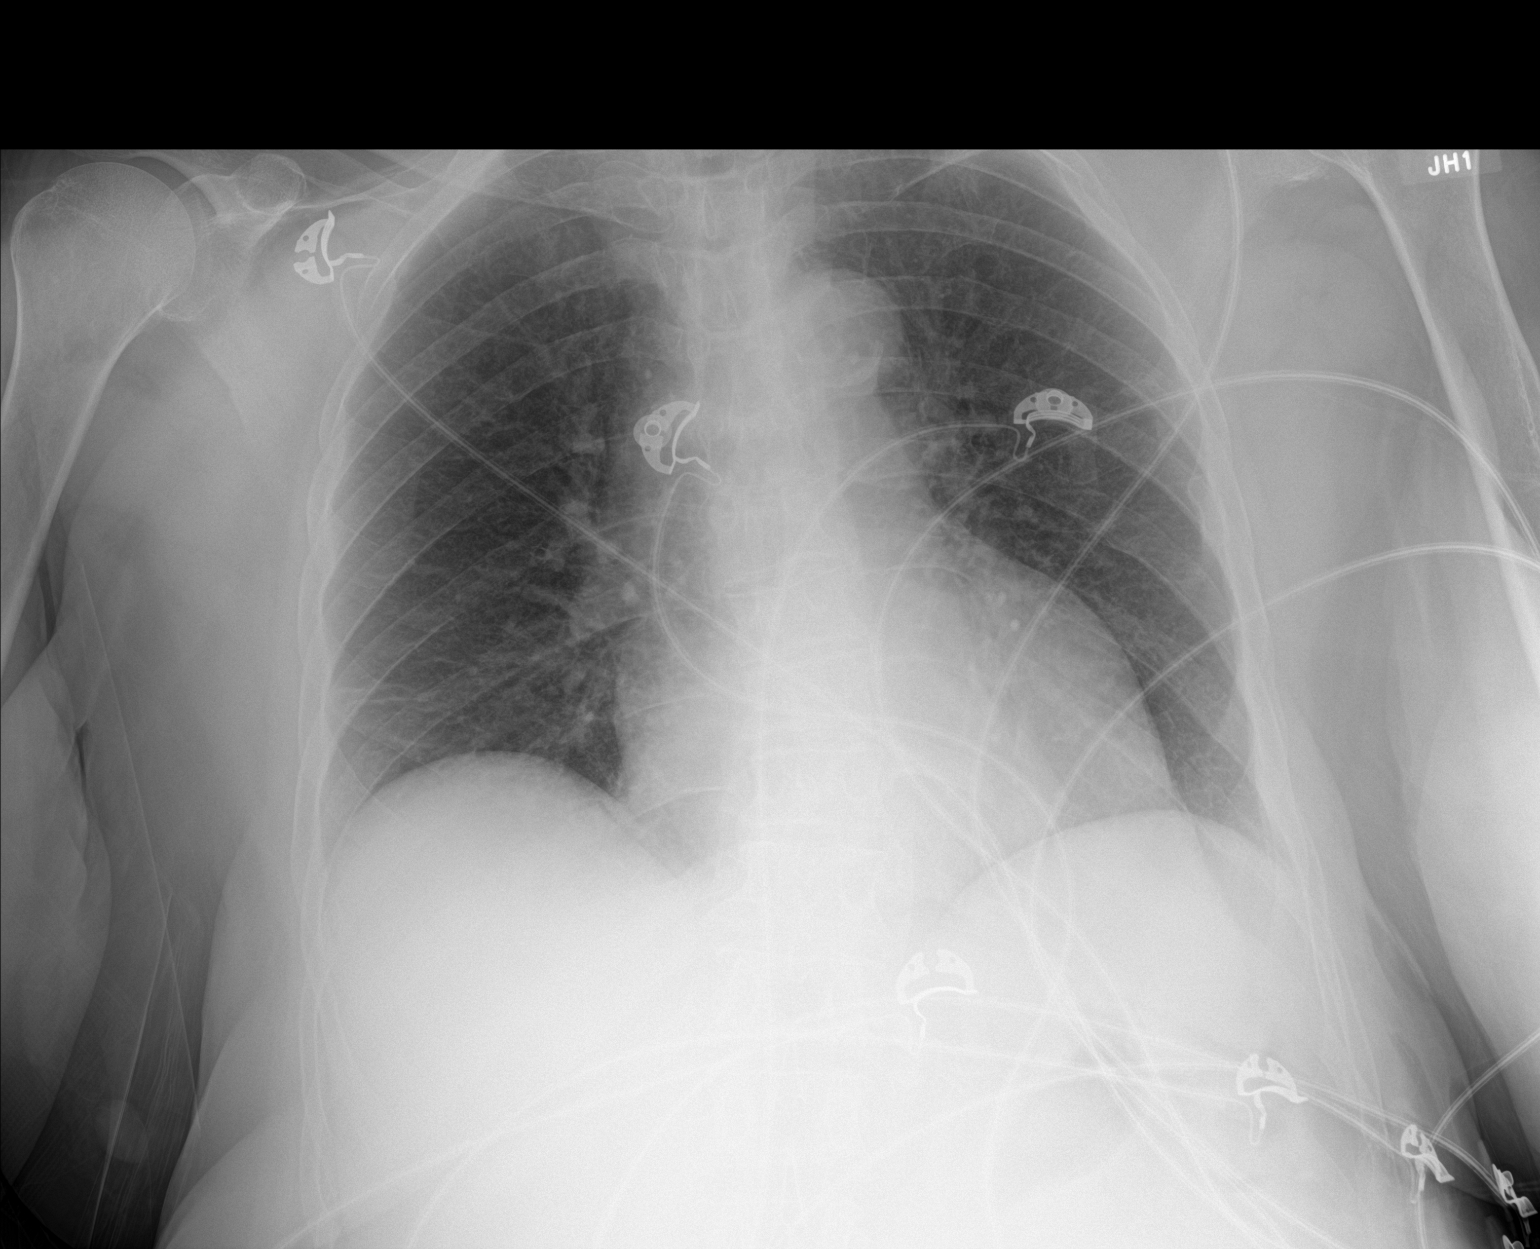

[1 of 1 positions shown; findings below may reference images not displayed]

FINDINGS: Heart and mediastinal contours are within normal limits. No focal
opacities or effusions. No acute bony abnormality. Old healed left
rib fractures.
IMPRESSION: No active cardiopulmonary disease.

## 2020-01-04 IMAGING — DX PORTABLE CHEST - 1 VIEW
1 series · 1 of 1 positions shown · non-contrast
Comparison: June 23, 2018

CLINICAL DATA: Intermittent left-sided chest pain for 2 months.

EXAM:
PORTABLE CHEST 1 VIEW

[chest ap]
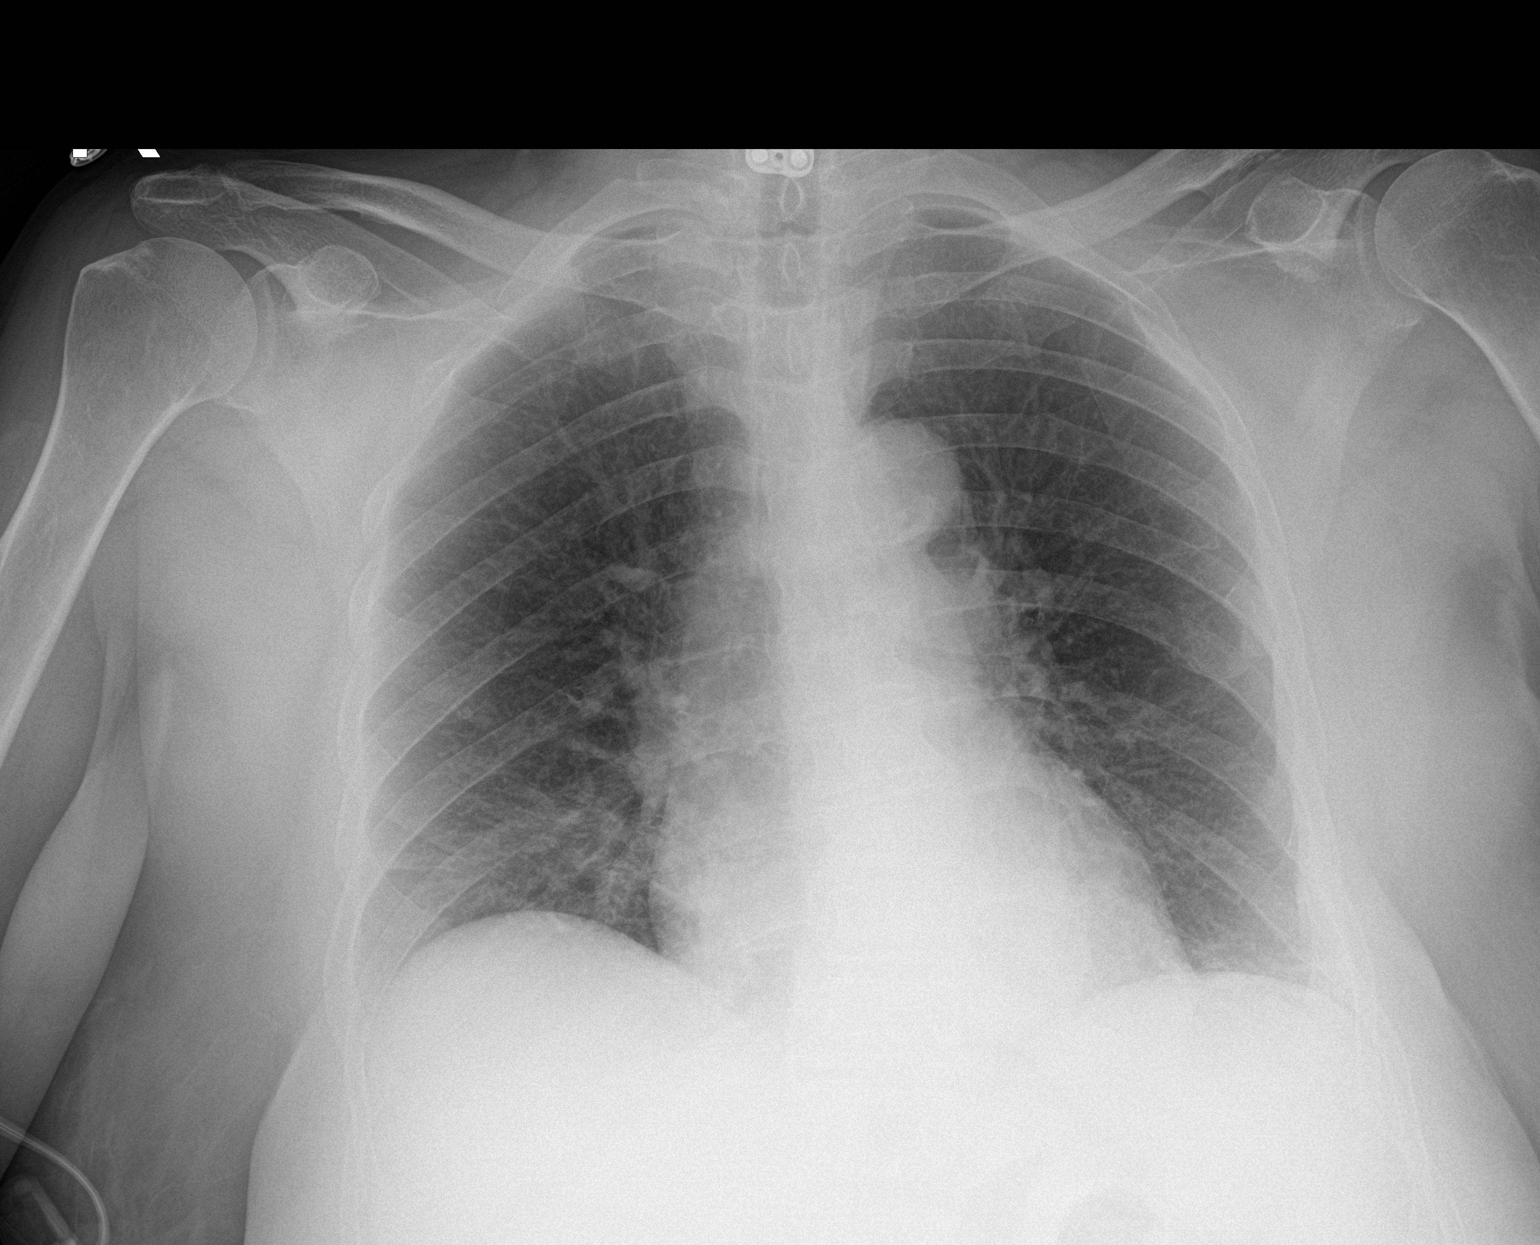

[1 of 1 positions shown; findings below may reference images not displayed]

FINDINGS: The heart size and mediastinal contours are within normal limits.
Both lungs are clear. The visualized skeletal structures are
unremarkable.
IMPRESSION: No active disease.
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
## 2020-04-20 IMAGING — US US BIOPSY FNA W/ IMAGING
1 series · 13 of 18 positions shown · non-contrast
Comparison: none

INDICATION: 62-year-old female with a history of left parotid lesion

[Series 1: us biopsy fna w/ imaging · 13 of 18 slices shown]
[im 1/18]
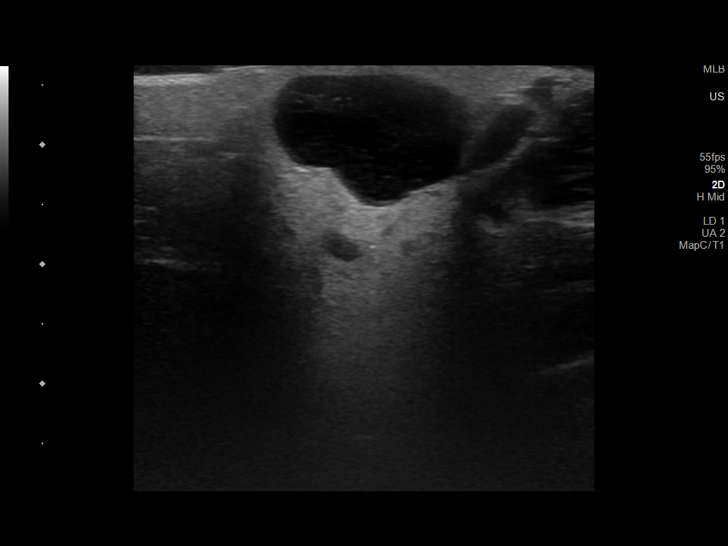
[im 3/18]
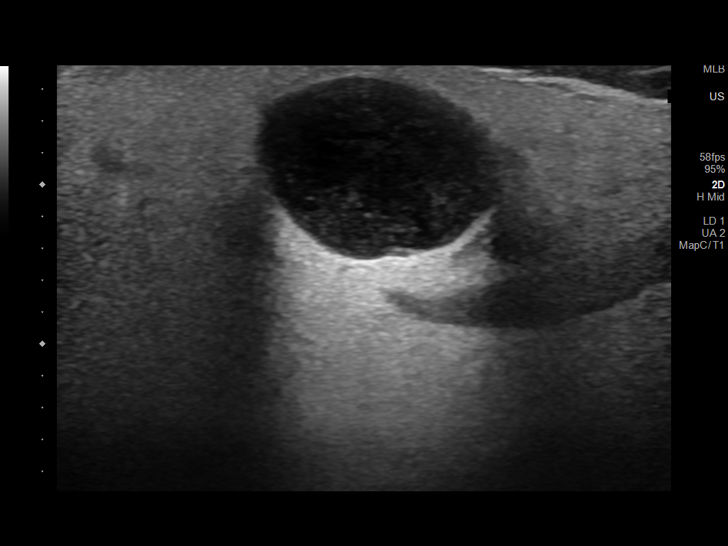
[im 4/18]
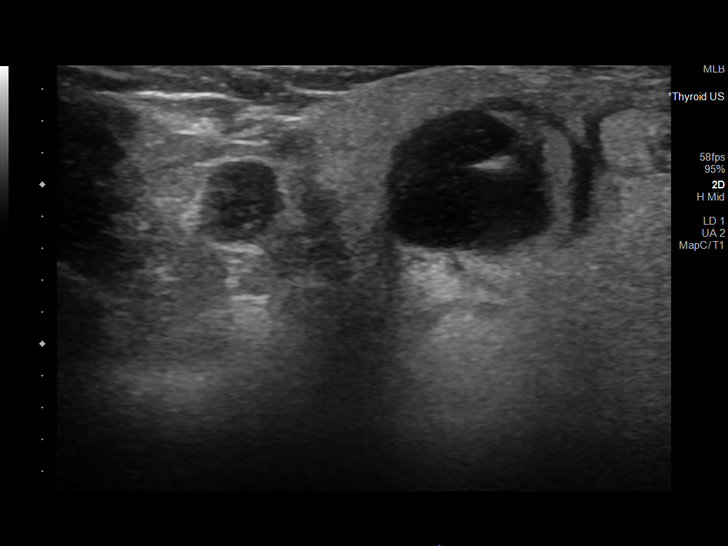
[im 5/18]
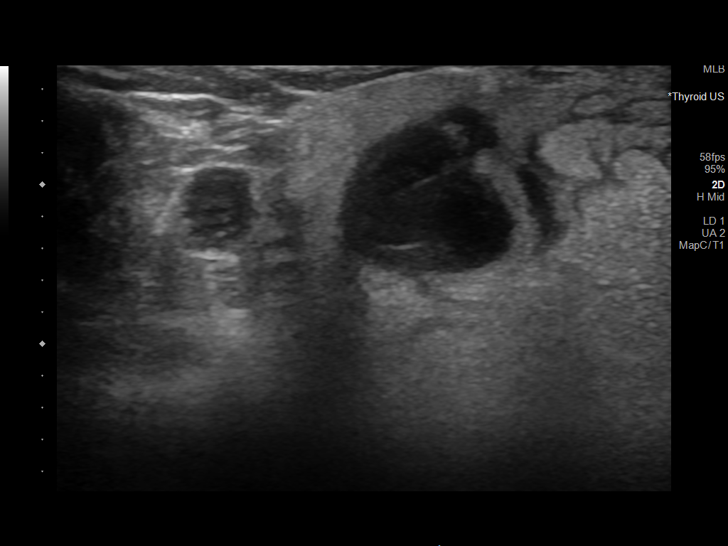
[im 7/18]
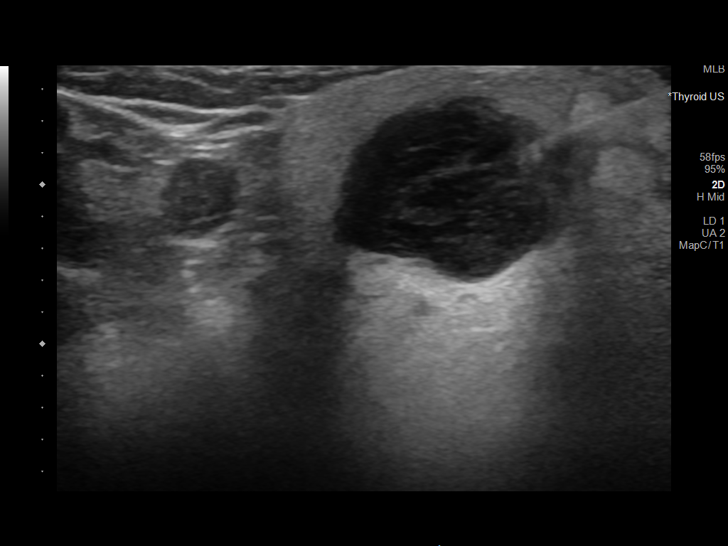
[im 8/18]
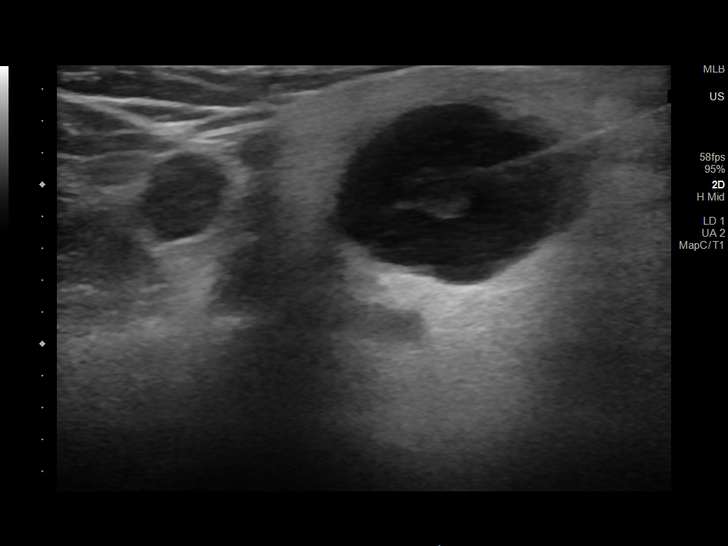
[im 10/18]
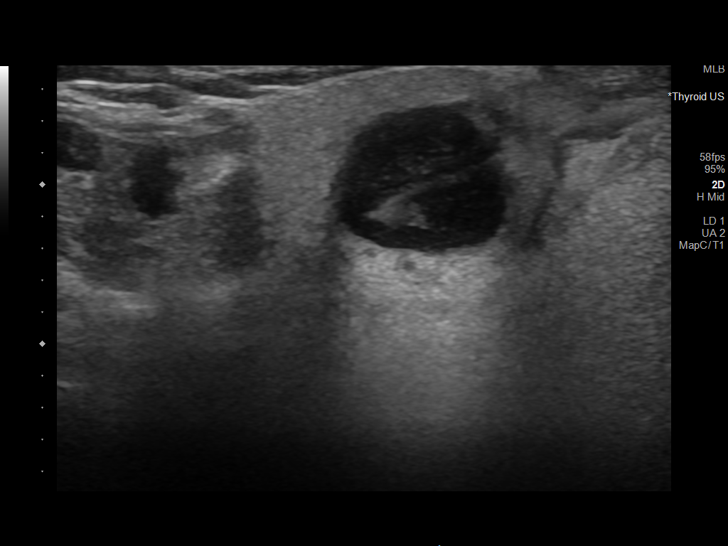
[im 11/18]
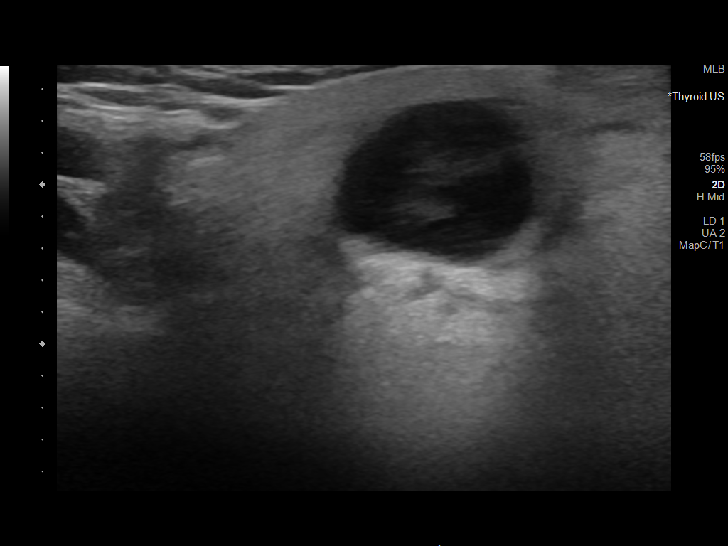
[im 12/18]
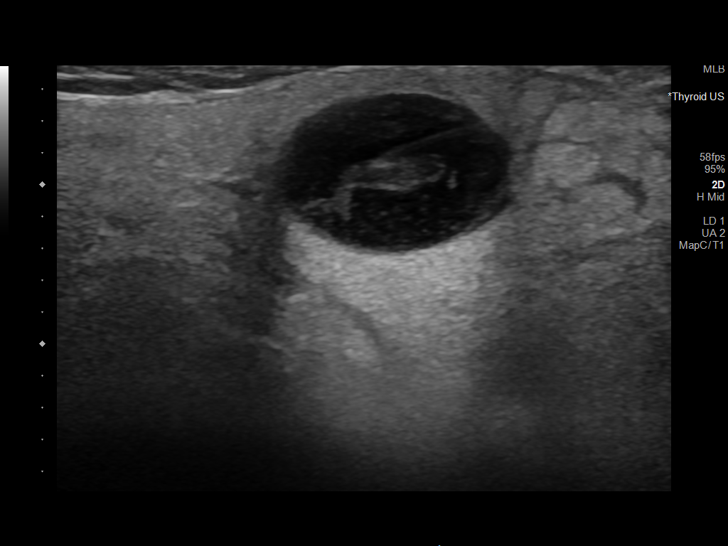
[im 14/18]
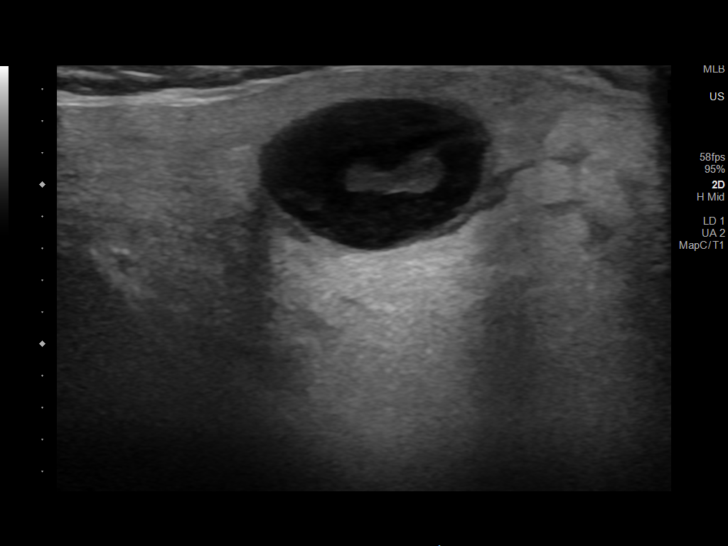
[im 15/18]
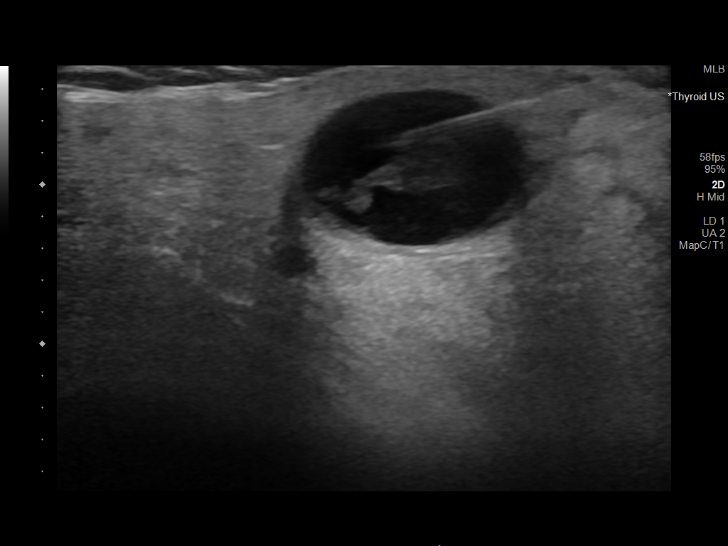
[im 16/18]
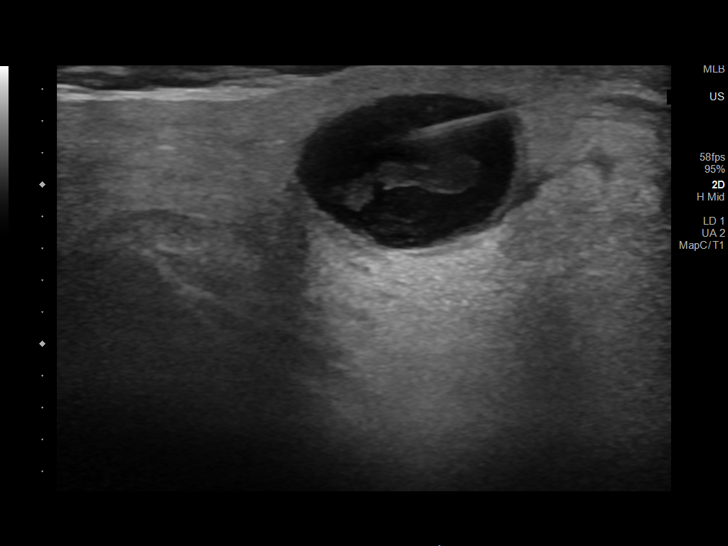
[im 18/18]
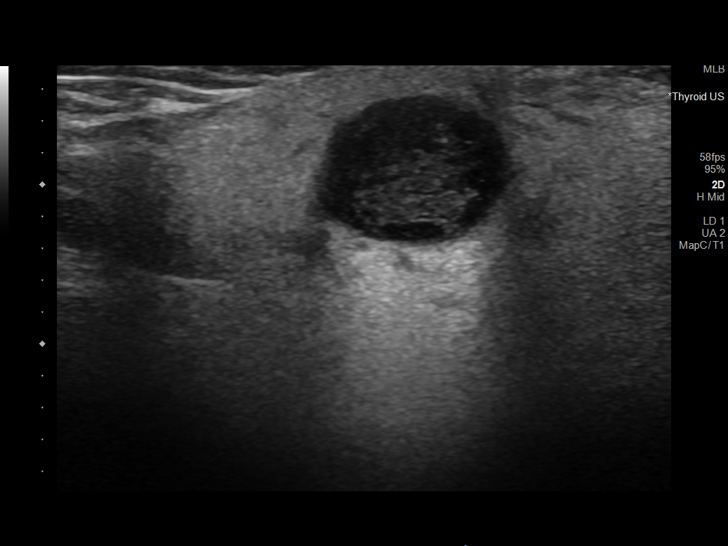

[13 of 18 positions shown; findings below may reference images not displayed]

EXAM:
ULTRASOUND-GUIDED BIOPSY

MEDICATIONS:
None.

ANESTHESIA/SEDATION:
Moderate (conscious) sedation was employed during this procedure. A
total of Versed 1.0 mg and Fentanyl 25 mcg was administered
intravenously.

Moderate Sedation Time: 13 minutes. The patient's level of
consciousness and vital signs were monitored continuously by
radiology nursing throughout the procedure under my direct
supervision.

FLUOROSCOPY TIME:  None

COMPLICATIONS:
None

PROCEDURE:
The procedure, risks, benefits, and alternatives were explained to
the patient. Questions regarding the procedure were encouraged and
answered. The patient understands and consents to the procedure.

Ultrasound survey was performed with images stored and sent to PACs.

The left neck was prepped with chlorhexidine in a sterile fashion,
and a sterile drape was applied covering the operative field. A
sterile gown and sterile gloves were used for the procedure. Local
anesthesia was provided with 1% Lidocaine.

Ultrasound guidance was used to infiltrate the region with 1%
lidocaine for local anesthesia. Four separate 25 gauge fine needle
biopsy were then acquired of the left parotid nodule using
ultrasound guidance. Images were stored.

Slide preparation was performed.

Final image was stored after biopsy.

Patient tolerated the procedure well and remained hemodynamically
stable throughout.

No complications were encountered and no significant blood loss was
encounter
IMPRESSION: Status post ultrasound-guided fine-needle biopsy of left parotid
lesion.

## 2020-06-12 ENCOUNTER — Encounter (HOSPITAL_COMMUNITY): Payer: Self-pay | Admitting: Emergency Medicine

## 2020-06-12 ENCOUNTER — Other Ambulatory Visit: Payer: Self-pay

## 2020-06-12 ENCOUNTER — Emergency Department (HOSPITAL_COMMUNITY): Payer: Medicare Other

## 2020-06-12 ENCOUNTER — Emergency Department (HOSPITAL_COMMUNITY)
Admission: EM | Admit: 2020-06-12 | Discharge: 2020-06-13 | Disposition: A | Discharge status: Home / Self Care | Payer: Medicare Other | Attending: Emergency Medicine | Admitting: Emergency Medicine

## 2020-06-12 DIAGNOSIS — I129 Hypertensive chronic kidney disease with stage 1 through stage 4 chronic kidney disease, or unspecified chronic kidney disease: Secondary | ICD-10-CM | POA: Diagnosis not present

## 2020-06-12 DIAGNOSIS — N183 Chronic kidney disease, stage 3 unspecified: Secondary | ICD-10-CM | POA: Diagnosis not present

## 2020-06-12 DIAGNOSIS — Z23 Encounter for immunization: Secondary | ICD-10-CM | POA: Insufficient documentation

## 2020-06-12 DIAGNOSIS — J449 Chronic obstructive pulmonary disease, unspecified: Secondary | ICD-10-CM | POA: Diagnosis not present

## 2020-06-12 DIAGNOSIS — F17210 Nicotine dependence, cigarettes, uncomplicated: Secondary | ICD-10-CM | POA: Insufficient documentation

## 2020-06-12 DIAGNOSIS — S0240FA Zygomatic fracture, left side, initial encounter for closed fracture: Secondary | ICD-10-CM

## 2020-06-12 DIAGNOSIS — W1809XA Striking against other object with subsequent fall, initial encounter: Secondary | ICD-10-CM | POA: Diagnosis not present

## 2020-06-12 DIAGNOSIS — S0990XA Unspecified injury of head, initial encounter: Secondary | ICD-10-CM

## 2020-06-12 DIAGNOSIS — Y92002 Bathroom of unspecified non-institutional (private) residence single-family (private) house as the place of occurrence of the external cause: Secondary | ICD-10-CM | POA: Diagnosis not present

## 2020-06-12 DIAGNOSIS — Z7951 Long term (current) use of inhaled steroids: Secondary | ICD-10-CM | POA: Insufficient documentation

## 2020-06-12 DIAGNOSIS — S0181XA Laceration without foreign body of other part of head, initial encounter: Secondary | ICD-10-CM | POA: Insufficient documentation

## 2020-06-12 DIAGNOSIS — Z79899 Other long term (current) drug therapy: Secondary | ICD-10-CM | POA: Diagnosis not present

## 2020-06-12 LAB — BASIC METABOLIC PANEL
Anion gap: 7 (ref 5–15)
BUN: 9 mg/dL (ref 8–23)
CO2: 22 mmol/L (ref 22–32)
Calcium: 8.8 mg/dL — ABNORMAL LOW (ref 8.9–10.3)
Chloride: 110 mmol/L (ref 98–111)
Creatinine, Ser: 1.03 mg/dL — ABNORMAL HIGH (ref 0.44–1.00)
GFR, Estimated: 60 mL/min — ABNORMAL LOW (ref >60)
Glucose, Bld: 100 mg/dL — ABNORMAL HIGH (ref 70–99)
Potassium: 3.6 mmol/L (ref 3.5–5.1)
Sodium: 139 mmol/L (ref 135–145)

## 2020-06-12 LAB — CBC WITH DIFFERENTIAL/PLATELET
Abs Immature Granulocytes: 0.01 10*3/uL (ref 0.00–0.07)
Basophils Absolute: 0 10*3/uL (ref 0.0–0.1)
Basophils Relative: 1 %
Eosinophils Absolute: 0.6 10*3/uL — ABNORMAL HIGH (ref 0.0–0.5)
Eosinophils Relative: 9 %
HCT: 39.2 % (ref 36.0–46.0)
Hemoglobin: 12.7 g/dL (ref 12.0–15.0)
Immature Granulocytes: 0 %
Lymphocytes Relative: 37 %
Lymphs Abs: 2.3 10*3/uL (ref 0.7–4.0)
MCH: 28.8 pg (ref 26.0–34.0)
MCHC: 32.4 g/dL (ref 30.0–36.0)
MCV: 88.9 fL (ref 80.0–100.0)
Monocytes Absolute: 0.3 10*3/uL (ref 0.1–1.0)
Monocytes Relative: 6 %
Neutro Abs: 3 10*3/uL (ref 1.7–7.7)
Neutrophils Relative %: 47 %
Platelets: 295 10*3/uL (ref 150–400)
RBC: 4.41 MIL/uL (ref 3.87–5.11)
RDW: 15 % (ref 11.5–15.5)
WBC: 6.2 10*3/uL (ref 4.0–10.5)
nRBC: 0 % (ref 0.0–0.2)

## 2020-06-12 LAB — PROTIME-INR
INR: 1.1 (ref 0.8–1.2)
Prothrombin Time: 13.8 s (ref 11.4–15.2)

## 2020-06-12 MED ORDER — FENTANYL CITRATE (PF) 100 MCG/2ML IJ SOLN
50.0000 ug | Freq: Once | INTRAMUSCULAR | Status: AC
  Administered 2020-06-12: 50 ug via INTRAVENOUS
  Filled 2020-06-12: qty 2

## 2020-06-12 MED ORDER — LIDOCAINE HCL 2 % IJ SOLN
20.0000 mL | Freq: Once | INTRAMUSCULAR | Status: AC
  Administered 2020-06-12: 400 mg
  Filled 2020-06-12: qty 20

## 2020-06-12 MED ORDER — TETANUS-DIPHTH-ACELL PERTUSSIS 5-2.5-18.5 LF-MCG/0.5 IM SUSY
0.5000 mL | PREFILLED_SYRINGE | Freq: Once | INTRAMUSCULAR | Status: AC
  Administered 2020-06-12: 0.5 mL via INTRAMUSCULAR
  Filled 2020-06-12: qty 0.5

## 2020-06-12 NOTE — ED Notes (Signed)
Daughter called and said her daughter is going to pick her up

## 2020-06-12 NOTE — ED Provider Notes (Signed)
  [facility] EMERGENCY DEPARTMENT Provider Note   CSN: [id number] Arrival date & time: 06/12/20  1412     Procedures .Marland KitchenLaceration Repair  Date/Time: 06/12/2020 6:08 PM Performed by: Pearson Grippe, DO Authorized by: Davonna Belling, MD   Consent:    Consent obtained:  Verbal   Consent given by:  Patient   Risks, benefits, and alternatives were discussed: yes     Risks discussed:  Infection, need for additional repair, poor cosmetic result and poor wound healing   Alternatives discussed:  Delayed treatment, referral and no treatment Universal protocol:    Patient identity confirmed:  Verbally with patient and arm band Anesthesia:    Anesthesia method:  Local infiltration   Local anesthetic:  Lidocaine 1% w/o epi Laceration details:    Location:  Face   Face location:  Forehead   Length (cm):  14   Depth (mm):  5 Pre-procedure details:    Preparation:  Patient was prepped and draped in usual sterile fashion and imaging obtained to evaluate for foreign bodies Exploration:    Limited defect created (wound extended): yes     Hemostasis achieved with:  Tied off vessels and direct pressure   Imaging outcome: foreign body not noted     Wound exploration: wound explored through full range of motion and entire depth of wound visualized     Wound extent: fascia violated     Contaminated: no   Treatment:    Area cleansed with:  Saline   Amount of cleaning:  Extensive   Irrigation solution:  Sterile saline   Irrigation volume:  1000   Irrigation method:  Syringe   Visualized foreign bodies/material removed: no     Debridement:  Extensive   Undermining:  Minimal   Layers/structures repaired:  Janae Sauce:    Suture size:  6-0   Suture material:  Chromic gut   Suture technique:  Horizontal mattress   Number of sutures:  2 Skin repair:    Repair method:  Sutures   Suture size:  6-0   Suture material:  Fast-absorbing gut   Suture technique:  Horizontal  mattress, simple interrupted and retention suture   Number of sutures:  9 Approximation:    Approximation:  Close Repair type:    Repair type:  Intermediate Post-procedure details:    Dressing:  Antibiotic ointment and non-adherent dressing   Procedure completion:  Tolerated well, no immediate complications       Pearson Grippe, DO 06/13/20 0123    Davonna Belling, MD 06/13/20 [phone]

## 2020-06-12 NOTE — ED Triage Notes (Signed)
Pt arrives via [facility] from home alone with reports of a fall and hitting head on bathroom wall corner. Pt has large laceration to forehead, minimal bleeding. Pt endorses head pain. EMS did not find any blood thinner medication at the house, they did find a bottle of oxycodone that had random pills in it.

## 2020-06-12 NOTE — Discharge Instructions (Addendum)
Follow-up with the plastic surgeon for the facial fracture and the facial laceration.  You can take your oxycodone to help with the pain.

## 2020-06-12 NOTE — ED Notes (Signed)
Patient still awaiting sutures to forehead.  Awaiting MD to bedside

## 2020-06-12 NOTE — ED Notes (Signed)
ED Provider at bedside. 
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
## 2020-06-12 NOTE — ED Notes (Signed)
Yelling intermittently about head and neck pain, c-collar in place at this time, pain meds given.
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
## 2020-06-13 NOTE — ED Notes (Addendum)
[place] daughter came and is refusing to take patient home.  Reports she doesn't have anywhere for her to stay.  Called daugther again and said for her to sit in the lobby

## 2020-07-15 ENCOUNTER — Other Ambulatory Visit: Payer: Self-pay | Admitting: Physician Assistant

## 2020-07-15 ENCOUNTER — Ambulatory Visit
Admission: RE | Admit: 2020-07-15 | Discharge: 2020-07-15 | Disposition: A | Discharge status: Home / Self Care | Payer: Medicare Other | Source: Ambulatory Visit | Attending: Physician Assistant | Admitting: Physician Assistant

## 2020-07-15 DIAGNOSIS — M5440 Lumbago with sciatica, unspecified side: Secondary | ICD-10-CM

## 2020-07-29 ENCOUNTER — Encounter (HOSPITAL_COMMUNITY): Payer: Self-pay

## 2020-07-29 ENCOUNTER — Emergency Department (HOSPITAL_COMMUNITY)
Admission: EM | Admit: 2020-07-29 | Discharge: 2020-07-29 | Disposition: A | Discharge status: Home / Self Care | Payer: Medicare Other | Attending: Emergency Medicine | Admitting: Emergency Medicine

## 2020-07-29 DIAGNOSIS — M2550 Pain in unspecified joint: Secondary | ICD-10-CM | POA: Diagnosis not present

## 2020-07-29 DIAGNOSIS — N183 Chronic kidney disease, stage 3 unspecified: Secondary | ICD-10-CM | POA: Insufficient documentation

## 2020-07-29 DIAGNOSIS — F17210 Nicotine dependence, cigarettes, uncomplicated: Secondary | ICD-10-CM | POA: Insufficient documentation

## 2020-07-29 DIAGNOSIS — I129 Hypertensive chronic kidney disease with stage 1 through stage 4 chronic kidney disease, or unspecified chronic kidney disease: Secondary | ICD-10-CM | POA: Insufficient documentation

## 2020-07-29 DIAGNOSIS — Z79899 Other long term (current) drug therapy: Secondary | ICD-10-CM | POA: Diagnosis not present

## 2020-07-29 DIAGNOSIS — Z7951 Long term (current) use of inhaled steroids: Secondary | ICD-10-CM | POA: Diagnosis not present

## 2020-07-29 DIAGNOSIS — J449 Chronic obstructive pulmonary disease, unspecified: Secondary | ICD-10-CM | POA: Diagnosis not present

## 2020-07-29 MED ORDER — NAPROXEN 500 MG PO TABS: 500.0000 mg | ORAL_TABLET | Freq: Two times a day (BID) | ORAL | 0 refills | Status: DC

## 2020-07-29 MED ORDER — OXYCODONE-ACETAMINOPHEN 5-325 MG PO TABS
1.0000 | ORAL_TABLET | Freq: Once | ORAL | Status: AC
  Administered 2020-07-29: 1 via ORAL
  Filled 2020-07-29: qty 1
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
## 2020-07-29 NOTE — Discharge Instructions (Addendum)
It was a pleasure taking care of you today.  As discussed, we are unable to provide a prescription for Percocet here in the ER due to chronic pain.  You must follow-up with your PCP or pain management for a prescription.  You were given 1 dose of Percocet here in the ER.  I am sending you home with naproxen.  Take as needed for pain.  You may also take over-the-counter Tylenol.  Return to the ER for new or worsening symptoms.

## 2020-07-29 NOTE — ED Triage Notes (Signed)
Pt arrived via walk in, c/o joint pain from RA. States she normally takes percocet for pain, but is out.

## 2020-10-22 IMAGING — CT CT CERVICAL SPINE W/O CM
3 of 4 series · 10 of 33 positions shown, 11 images · non-contrast
Comparison: CT scan dated 05/12/2015

CLINICAL DATA: Neck pain. No trauma.

EXAM:
CT CERVICAL SPINE WITHOUT CONTRAST
TECHNIQUE: Multidetector CT imaging of the cervical spine was performed without
intravenous contrast. Multiplanar CT image reconstructions were also
generated.

[Series 8: sagittal bone · sagittal · 0.24mm/px · 5 of 61 slices shown]
[im 21/61  bone]
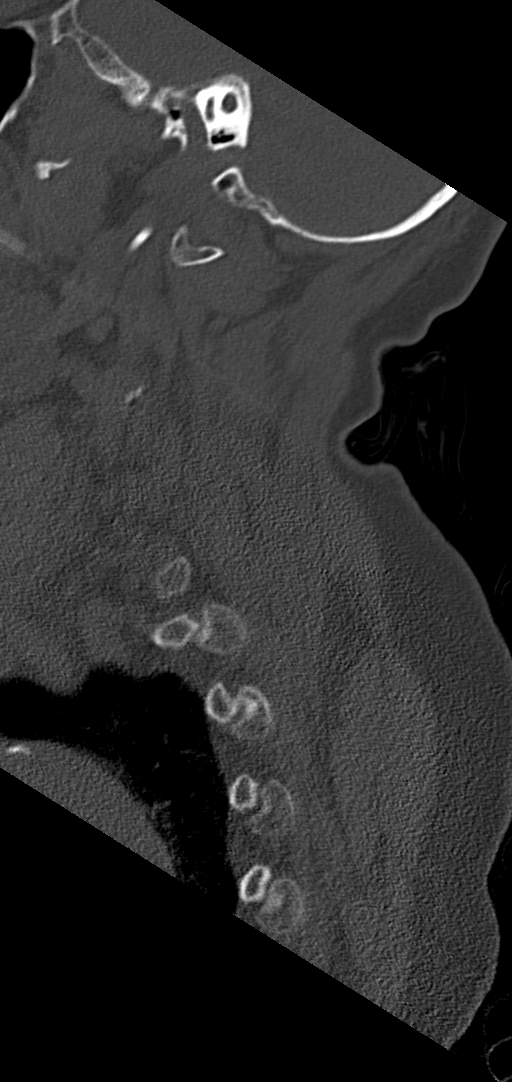
[im 26/61  bone]
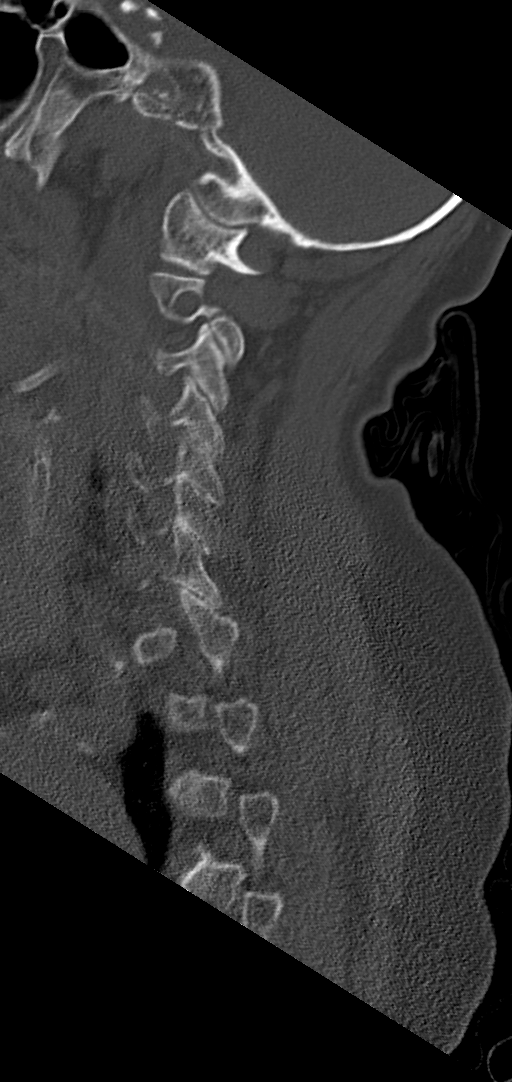
[im 31/61  bone]
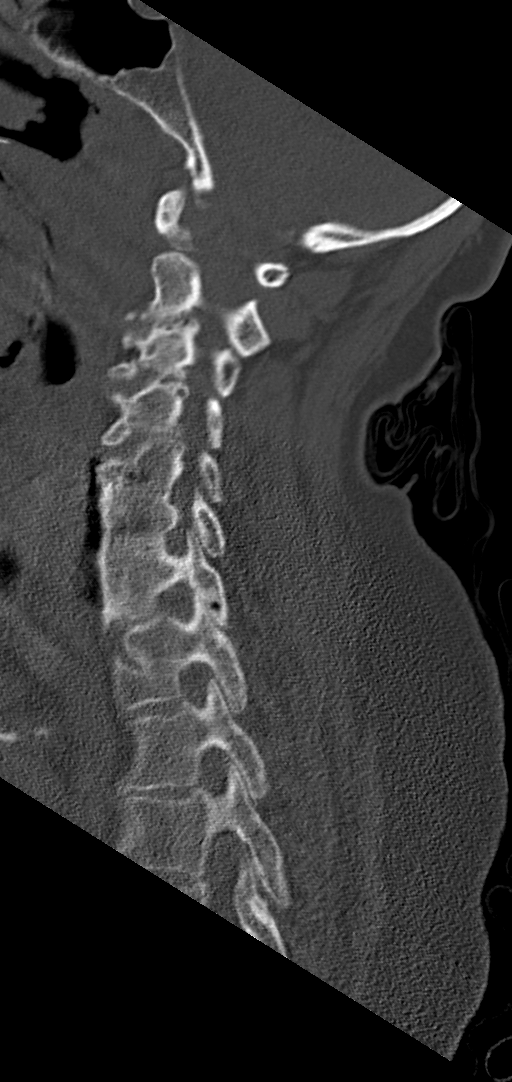
[im 36/61  bone]
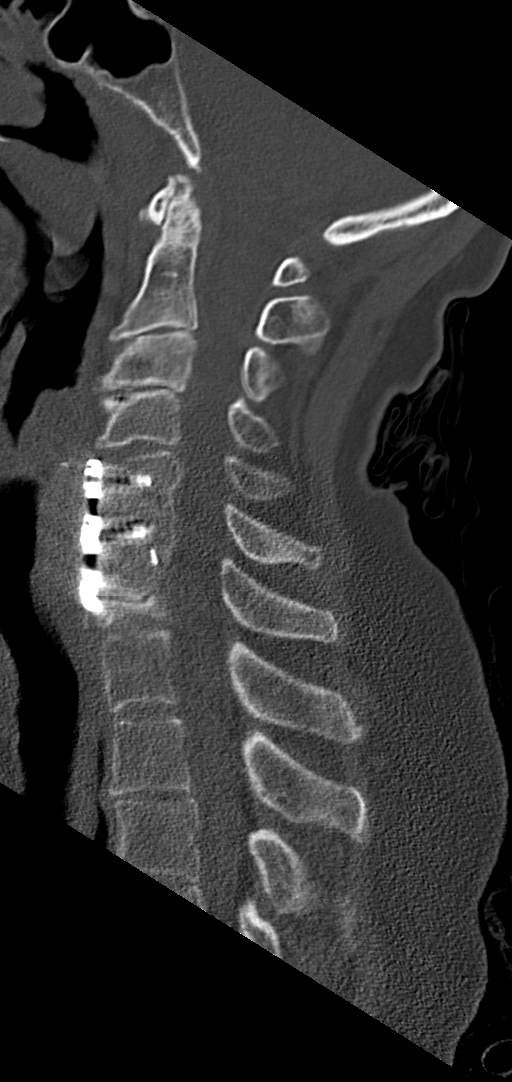
[im 41/61  bone]
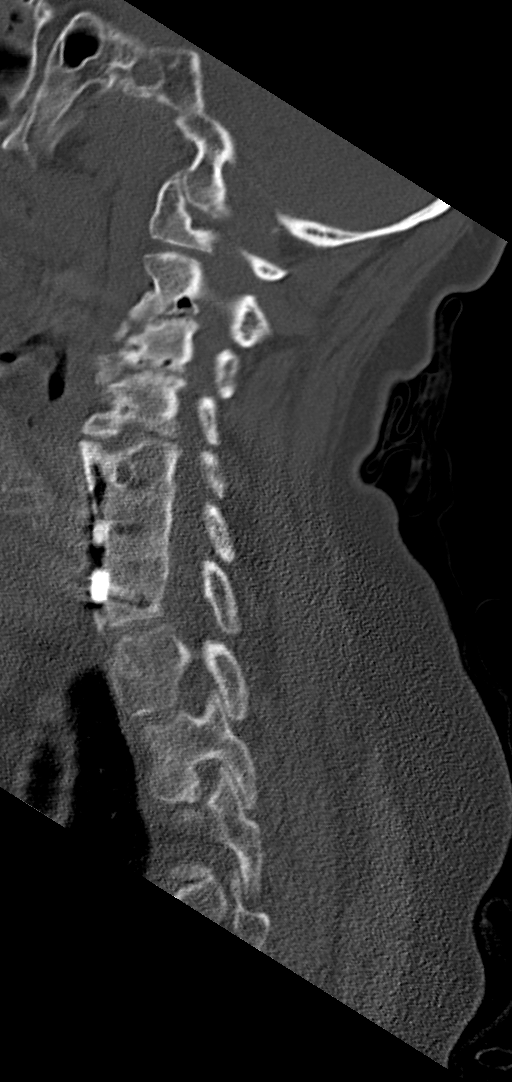

[Series 9: coronal bone · coronal · 0.23mm/px · 3 of 63 slices shown]
[im 18/63  bone]
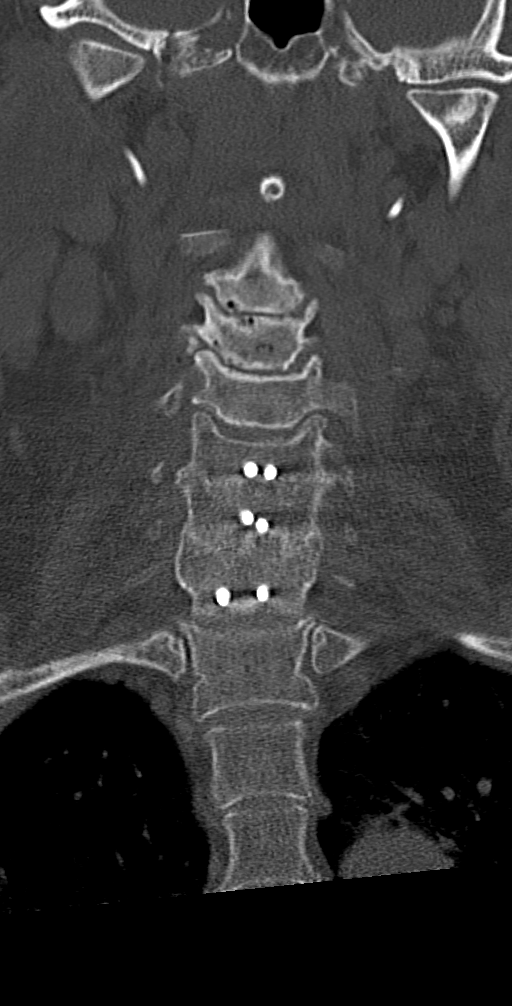
[im 27/63  bone]
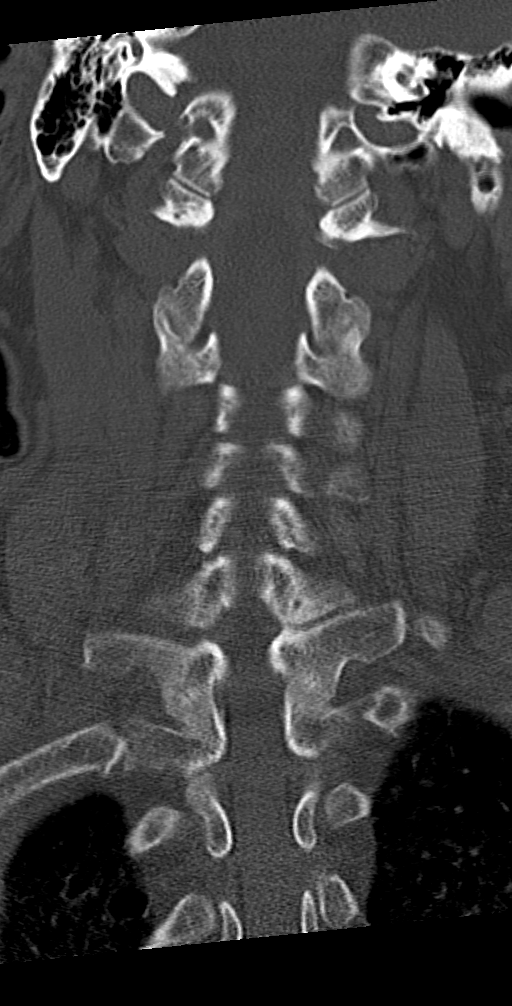
[im 36/63  bone]
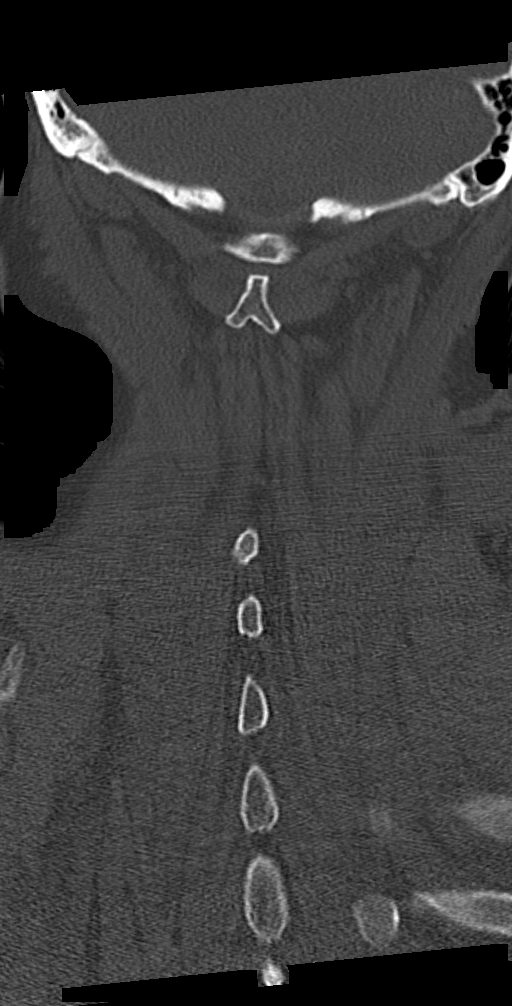

[Series 10: orthogonal bone · axial · 0.23mm/px · z∈[-175,-83]mm · 2 of 132 slices shown, 3 images]
[im 38/132  soft-tissue]
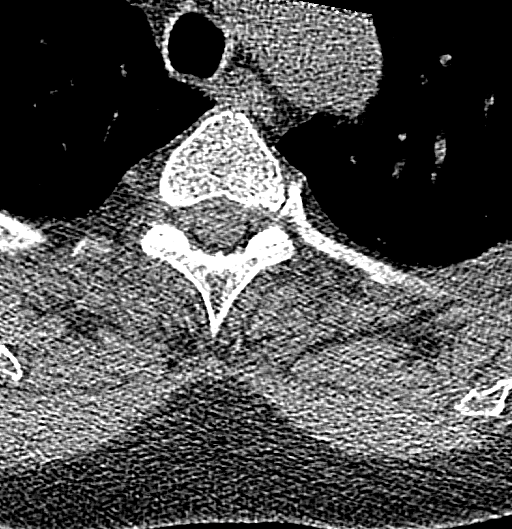
[im 38/132  bone]
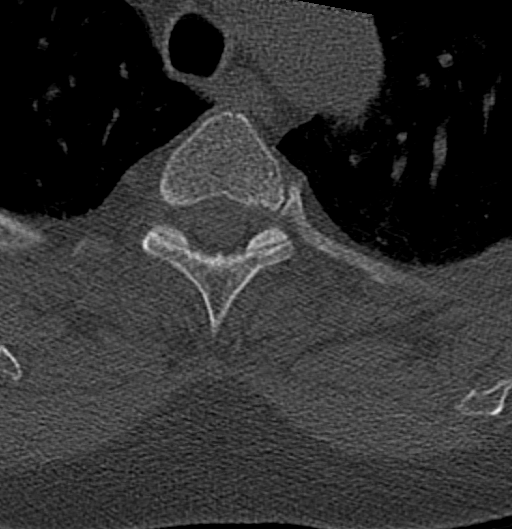
[im 94/132  bone]
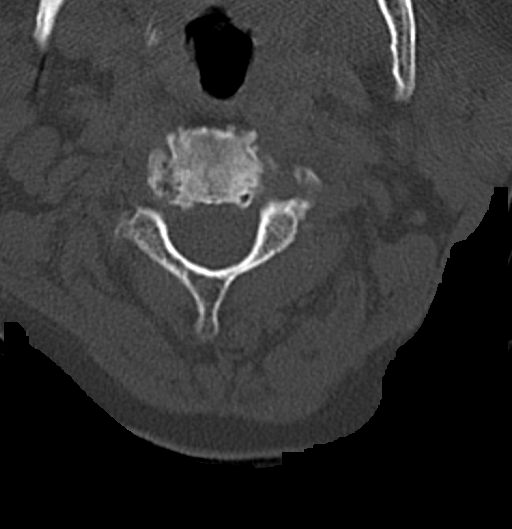

[10 of 33 positions shown; findings below may reference images not displayed]

FINDINGS: Alignment: Normal.

Skull base and vertebrae: Previous solid anterior cervical fusions
at C5-6 and C6-7. No acute fracture. No primary bone lesion or focal
pathologic process.

Soft tissues and spinal canal: No prevertebral fluid or swelling. No
visible canal hematoma.

Disc levels: C2-3: Progressive degenerative disc disease with
increased disc space narrowing and progressive degenerative changes
of the vertebral endplates. No foraminal stenosis. No facet
arthritis.

C3-4: Chronic severe degenerative disc disease with disc space
narrowing and extensive degenerative changes of the vertebral
endplates. No foraminal stenosis. No facet arthritis.

C4-5: Tiny endplate osteophytes extend into the left and right of
midline slightly narrowing the lateral recesses. No significant
foraminal stenosis. No facet arthritis.

C5-6 and C6-7: Solid anterior fusions with no residual impingement.
Widely patent neural foramina bilaterally.

C7-T1: Normal disc. Slight left facet arthritis. No foraminal
stenosis.

T1-2 through T3-4: No significant abnormalities.

Upper chest: No acute abnormalities. Aortic atherosclerosis slight
emphysematous changes.

Other: None
IMPRESSION: 1. Progressive degenerative disc disease at C2-3 and C3-4 with no
focal neural impingement.
2. Solid anterior cervical fusions at C5-6 and C6-7 with no residual
impingement.

Aortic Atherosclerosis ([id number]).

## 2020-11-02 ENCOUNTER — Encounter (HOSPITAL_BASED_OUTPATIENT_CLINIC_OR_DEPARTMENT_OTHER): Payer: Self-pay

## 2020-11-02 ENCOUNTER — Emergency Department (HOSPITAL_BASED_OUTPATIENT_CLINIC_OR_DEPARTMENT_OTHER): Payer: Medicare Other

## 2020-11-02 ENCOUNTER — Inpatient Hospital Stay (HOSPITAL_COMMUNITY): Payer: Medicare Other

## 2020-11-02 ENCOUNTER — Other Ambulatory Visit: Payer: Self-pay

## 2020-11-02 ENCOUNTER — Inpatient Hospital Stay (HOSPITAL_BASED_OUTPATIENT_CLINIC_OR_DEPARTMENT_OTHER)
Admission: EM | Admit: 2020-11-02 | Discharge: 2020-11-10 | DRG: 335 | Disposition: A | Discharge status: Home / Self Care | Payer: Medicare Other | Attending: Family Medicine | Admitting: Family Medicine

## 2020-11-02 DIAGNOSIS — Z20822 Contact with and (suspected) exposure to covid-19: Secondary | ICD-10-CM | POA: Diagnosis present

## 2020-11-02 DIAGNOSIS — Z90710 Acquired absence of both cervix and uterus: Secondary | ICD-10-CM

## 2020-11-02 DIAGNOSIS — E782 Mixed hyperlipidemia: Secondary | ICD-10-CM | POA: Diagnosis present

## 2020-11-02 DIAGNOSIS — I129 Hypertensive chronic kidney disease with stage 1 through stage 4 chronic kidney disease, or unspecified chronic kidney disease: Secondary | ICD-10-CM | POA: Diagnosis present

## 2020-11-02 DIAGNOSIS — K660 Peritoneal adhesions (postprocedural) (postinfection): Secondary | ICD-10-CM | POA: Diagnosis present

## 2020-11-02 DIAGNOSIS — R339 Retention of urine, unspecified: Secondary | ICD-10-CM | POA: Diagnosis present

## 2020-11-02 DIAGNOSIS — K3533 Acute appendicitis with perforation and localized peritonitis, with abscess: Secondary | ICD-10-CM | POA: Diagnosis present

## 2020-11-02 DIAGNOSIS — M4800 Spinal stenosis, site unspecified: Secondary | ICD-10-CM | POA: Diagnosis present

## 2020-11-02 DIAGNOSIS — N179 Acute kidney failure, unspecified: Secondary | ICD-10-CM | POA: Diagnosis not present

## 2020-11-02 DIAGNOSIS — E872 Acidosis, unspecified: Secondary | ICD-10-CM | POA: Diagnosis present

## 2020-11-02 DIAGNOSIS — J9811 Atelectasis: Secondary | ICD-10-CM | POA: Diagnosis not present

## 2020-11-02 DIAGNOSIS — Z885 Allergy status to narcotic agent status: Secondary | ICD-10-CM

## 2020-11-02 DIAGNOSIS — J449 Chronic obstructive pulmonary disease, unspecified: Secondary | ICD-10-CM | POA: Diagnosis present

## 2020-11-02 DIAGNOSIS — Z888 Allergy status to other drugs, medicaments and biological substances status: Secondary | ICD-10-CM

## 2020-11-02 DIAGNOSIS — R109 Unspecified abdominal pain: Secondary | ICD-10-CM

## 2020-11-02 DIAGNOSIS — K219 Gastro-esophageal reflux disease without esophagitis: Secondary | ICD-10-CM | POA: Diagnosis present

## 2020-11-02 DIAGNOSIS — B966 Bacteroides fragilis [B. fragilis] as the cause of diseases classified elsewhere: Secondary | ICD-10-CM | POA: Diagnosis present

## 2020-11-02 DIAGNOSIS — I10 Essential (primary) hypertension: Secondary | ICD-10-CM | POA: Diagnosis present

## 2020-11-02 DIAGNOSIS — F17210 Nicotine dependence, cigarettes, uncomplicated: Secondary | ICD-10-CM | POA: Diagnosis present

## 2020-11-02 DIAGNOSIS — Z87892 Personal history of anaphylaxis: Secondary | ICD-10-CM

## 2020-11-02 DIAGNOSIS — D649 Anemia, unspecified: Secondary | ICD-10-CM | POA: Diagnosis present

## 2020-11-02 DIAGNOSIS — I2510 Atherosclerotic heart disease of native coronary artery without angina pectoris: Secondary | ICD-10-CM | POA: Diagnosis present

## 2020-11-02 DIAGNOSIS — Z981 Arthrodesis status: Secondary | ICD-10-CM | POA: Diagnosis not present

## 2020-11-02 DIAGNOSIS — K567 Ileus, unspecified: Secondary | ICD-10-CM | POA: Diagnosis present

## 2020-11-02 DIAGNOSIS — M069 Rheumatoid arthritis, unspecified: Secondary | ICD-10-CM | POA: Diagnosis present

## 2020-11-02 DIAGNOSIS — Z7951 Long term (current) use of inhaled steroids: Secondary | ICD-10-CM

## 2020-11-02 DIAGNOSIS — Z91041 Radiographic dye allergy status: Secondary | ICD-10-CM

## 2020-11-02 DIAGNOSIS — N1831 Chronic kidney disease, stage 3a: Secondary | ICD-10-CM | POA: Diagnosis present

## 2020-11-02 DIAGNOSIS — F1110 Opioid abuse, uncomplicated: Secondary | ICD-10-CM | POA: Diagnosis present

## 2020-11-02 DIAGNOSIS — Z880 Allergy status to penicillin: Secondary | ICD-10-CM

## 2020-11-02 DIAGNOSIS — Z87442 Personal history of urinary calculi: Secondary | ICD-10-CM

## 2020-11-02 DIAGNOSIS — K3532 Acute appendicitis with perforation and localized peritonitis, without abscess: Secondary | ICD-10-CM | POA: Diagnosis present

## 2020-11-02 DIAGNOSIS — K352 Acute appendicitis with generalized peritonitis, without abscess: Secondary | ICD-10-CM

## 2020-11-02 DIAGNOSIS — K668 Other specified disorders of peritoneum: Secondary | ICD-10-CM | POA: Diagnosis present

## 2020-11-02 DIAGNOSIS — F1410 Cocaine abuse, uncomplicated: Secondary | ICD-10-CM | POA: Diagnosis present

## 2020-11-02 DIAGNOSIS — Z7952 Long term (current) use of systemic steroids: Secondary | ICD-10-CM

## 2020-11-02 DIAGNOSIS — A4151 Sepsis due to Escherichia coli [E. coli]: Secondary | ICD-10-CM | POA: Diagnosis not present

## 2020-11-02 DIAGNOSIS — Z8701 Personal history of pneumonia (recurrent): Secondary | ICD-10-CM

## 2020-11-02 DIAGNOSIS — Z79899 Other long term (current) drug therapy: Secondary | ICD-10-CM

## 2020-11-02 DIAGNOSIS — Z881 Allergy status to other antibiotic agents status: Secondary | ICD-10-CM

## 2020-11-02 DIAGNOSIS — A419 Sepsis, unspecified organism: Secondary | ICD-10-CM | POA: Diagnosis present

## 2020-11-02 LAB — CBC WITH DIFFERENTIAL/PLATELET
Abs Immature Granulocytes: 0.03 10*3/uL (ref 0.00–0.07)
Basophils Absolute: 0 10*3/uL (ref 0.0–0.1)
Basophils Relative: 0 %
Eosinophils Absolute: 0 10*3/uL (ref 0.0–0.5)
Eosinophils Relative: 0 %
HCT: 41.6 % (ref 36.0–46.0)
Hemoglobin: 13.8 g/dL (ref 12.0–15.0)
Immature Granulocytes: 0 %
Lymphocytes Relative: 12 %
Lymphs Abs: 1.2 10*3/uL (ref 0.7–4.0)
MCH: 29.3 pg (ref 26.0–34.0)
MCHC: 33.2 g/dL (ref 30.0–36.0)
MCV: 88.3 fL (ref 80.0–100.0)
Monocytes Absolute: 0.4 10*3/uL (ref 0.1–1.0)
Monocytes Relative: 4 %
Neutro Abs: 8.3 10*3/uL — ABNORMAL HIGH (ref 1.7–7.7)
Neutrophils Relative %: 84 %
Platelets: 332 10*3/uL (ref 150–400)
RBC: 4.71 MIL/uL (ref 3.87–5.11)
RDW: 14 % (ref 11.5–15.5)
WBC: 10 10*3/uL (ref 4.0–10.5)
nRBC: 0 % (ref 0.0–0.2)

## 2020-11-02 LAB — COMPREHENSIVE METABOLIC PANEL
ALT: 6 U/L (ref 0–44)
AST: 11 U/L — ABNORMAL LOW (ref 15–41)
Albumin: 3.8 g/dL (ref 3.5–5.0)
Alkaline Phosphatase: 103 U/L (ref 38–126)
Anion gap: 12 (ref 5–15)
BUN: 14 mg/dL (ref 8–23)
CO2: 23 mmol/L (ref 22–32)
Calcium: 9.7 mg/dL (ref 8.9–10.3)
Chloride: 101 mmol/L (ref 98–111)
Creatinine, Ser: 1.17 mg/dL — ABNORMAL HIGH (ref 0.44–1.00)
GFR, Estimated: 51 mL/min — ABNORMAL LOW (ref >60)
Glucose, Bld: 116 mg/dL — ABNORMAL HIGH (ref 70–99)
Potassium: 3.7 mmol/L (ref 3.5–5.1)
Sodium: 136 mmol/L (ref 135–145)
Total Bilirubin: 0.6 mg/dL (ref 0.3–1.2)
Total Protein: 7.6 g/dL (ref 6.5–8.1)

## 2020-11-02 LAB — APTT: aPTT: 28 seconds (ref 24–36)

## 2020-11-02 LAB — RESP PANEL BY RT-PCR (FLU A&B, COVID) ARPGX2
Influenza A by PCR: NEGATIVE
Influenza B by PCR: NEGATIVE
SARS Coronavirus 2 by RT PCR: NEGATIVE

## 2020-11-02 LAB — PROTIME-INR
INR: 1.1 (ref 0.8–1.2)
Prothrombin Time: 13.9 seconds (ref 11.4–15.2)

## 2020-11-02 LAB — HIV ANTIBODY (ROUTINE TESTING W REFLEX): HIV Screen 4th Generation wRfx: NONREACTIVE

## 2020-11-02 MED ORDER — SODIUM CHLORIDE 0.9 % IV SOLN
2.0000 g | INTRAVENOUS | Status: DC
  Administered 2020-11-02: 2 g via INTRAVENOUS
  Filled 2020-11-02: qty 20

## 2020-11-02 MED ORDER — FENTANYL CITRATE PF 50 MCG/ML IJ SOSY
50.0000 ug | PREFILLED_SYRINGE | INTRAMUSCULAR | Status: DC | PRN
  Administered 2020-11-02 – 2020-11-03 (×4): 50 ug via INTRAVENOUS
  Filled 2020-11-02 (×4): qty 1

## 2020-11-02 MED ORDER — HALOPERIDOL LACTATE 5 MG/ML IJ SOLN: 2.0000 mg | Freq: Once | INTRAMUSCULAR | Status: DC

## 2020-11-02 MED ORDER — WHITE PETROLATUM EX OINT
TOPICAL_OINTMENT | CUTANEOUS | Status: AC
  Filled 2020-11-02: qty 28.35

## 2020-11-02 MED ORDER — SODIUM CHLORIDE 0.9 % IV BOLUS
500.0000 mL | Freq: Once | INTRAVENOUS | Status: AC
  Administered 2020-11-02: 500 mL via INTRAVENOUS

## 2020-11-02 MED ORDER — SODIUM CHLORIDE 0.9 % IV SOLN: INTRAVENOUS | Status: DC

## 2020-11-02 MED ORDER — BARIUM SULFATE 2.1 % PO SUSP
ORAL | Status: AC
  Filled 2020-11-02: qty 2

## 2020-11-02 MED ORDER — FENTANYL CITRATE PF 50 MCG/ML IJ SOSY
50.0000 ug | PREFILLED_SYRINGE | Freq: Once | INTRAMUSCULAR | Status: AC
Start: 2020-11-02 — End: 2020-11-02
  Administered 2020-11-02: 50 ug via INTRAVENOUS
  Filled 2020-11-02: qty 1

## 2020-11-02 MED ORDER — SODIUM CHLORIDE 0.9 % IV SOLN
2.0000 g | Freq: Once | INTRAVENOUS | Status: AC
  Administered 2020-11-02: 2 g via INTRAVENOUS
  Filled 2020-11-02: qty 2

## 2020-11-02 MED ORDER — FLUTICASONE PROPIONATE 50 MCG/ACT NA SUSP
2.0000 | Freq: Every day | NASAL | Status: DC
  Administered 2020-11-02 – 2020-11-10 (×7): 2 via NASAL
  Filled 2020-11-02: qty 16

## 2020-11-02 MED ORDER — POTASSIUM CHLORIDE 2 MEQ/ML IV SOLN
INTRAVENOUS | Status: DC
  Filled 2020-11-02 (×11): qty 1000

## 2020-11-02 MED ORDER — METRONIDAZOLE 500 MG/100ML IV SOLN
500.0000 mg | Freq: Three times a day (TID) | INTRAVENOUS | Status: DC
  Administered 2020-11-02 – 2020-11-05 (×8): 500 mg via INTRAVENOUS
  Filled 2020-11-02 (×9): qty 100

## 2020-11-02 MED ORDER — FLUTICASONE FUROATE-VILANTEROL 200-25 MCG/ACT IN AEPB
1.0000 | INHALATION_SPRAY | Freq: Every day | RESPIRATORY_TRACT | Status: DC
  Administered 2020-11-02 – 2020-11-10 (×8): 1 via RESPIRATORY_TRACT
  Filled 2020-11-02: qty 28

## 2020-11-02 MED ORDER — SODIUM CHLORIDE 0.9 % IV SOLN
2.0000 g | Freq: Two times a day (BID) | INTRAVENOUS | Status: DC
  Administered 2020-11-02: 2 g via INTRAVENOUS
  Filled 2020-11-02: qty 2

## 2020-11-02 MED ORDER — ACETAMINOPHEN 500 MG PO TABS
1000.0000 mg | ORAL_TABLET | Freq: Once | ORAL | Status: AC
  Administered 2020-11-02: 1000 mg via ORAL

## 2020-11-02 MED ORDER — ALBUTEROL SULFATE (2.5 MG/3ML) 0.083% IN NEBU: 2.5000 mg | INHALATION_SOLUTION | Freq: Four times a day (QID) | RESPIRATORY_TRACT | Status: DC | PRN

## 2020-11-02 MED ORDER — FENTANYL CITRATE PF 50 MCG/ML IJ SOSY
100.0000 ug | PREFILLED_SYRINGE | Freq: Once | INTRAMUSCULAR | Status: AC
  Administered 2020-11-02: 100 ug via INTRAVENOUS
  Filled 2020-11-02: qty 2

## 2020-11-02 MED ORDER — PANTOPRAZOLE SODIUM 40 MG IV SOLR
40.0000 mg | Freq: Two times a day (BID) | INTRAVENOUS | Status: DC
  Administered 2020-11-02 – 2020-11-10 (×17): 40 mg via INTRAVENOUS
  Filled 2020-11-02 (×17): qty 40

## 2020-11-02 MED ORDER — ACETAMINOPHEN 650 MG RE SUPP
650.0000 mg | Freq: Four times a day (QID) | RECTAL | Status: DC | PRN
Start: 2020-11-02 — End: 2020-11-05

## 2020-11-02 MED ORDER — ACETAMINOPHEN 325 MG PO TABS: 650.0000 mg | ORAL_TABLET | Freq: Four times a day (QID) | ORAL | Status: DC | PRN

## 2020-11-02 MED ORDER — HYDROMORPHONE HCL 1 MG/ML IJ SOLN: 0.5000 mg | INTRAMUSCULAR | Status: DC | PRN

## 2020-11-02 MED ORDER — FENTANYL CITRATE PF 50 MCG/ML IJ SOSY
25.0000 ug | PREFILLED_SYRINGE | INTRAMUSCULAR | Status: DC | PRN
  Administered 2020-11-02: 25 ug via INTRAVENOUS
  Filled 2020-11-02 (×2): qty 1

## 2020-11-02 MED ORDER — PROCHLORPERAZINE EDISYLATE 10 MG/2ML IJ SOLN
10.0000 mg | Freq: Four times a day (QID) | INTRAMUSCULAR | Status: DC | PRN
  Administered 2020-11-02: 10 mg via INTRAVENOUS
  Filled 2020-11-02: qty 2

## 2020-11-02 MED ORDER — HYDRALAZINE HCL 20 MG/ML IJ SOLN: 10.0000 mg | Freq: Four times a day (QID) | INTRAMUSCULAR | Status: DC | PRN

## 2020-11-02 MED ORDER — METRONIDAZOLE 500 MG/100ML IV SOLN
500.0000 mg | Freq: Once | INTRAVENOUS | Status: AC
  Administered 2020-11-02: 500 mg via INTRAVENOUS
  Filled 2020-11-02: qty 100

## 2020-11-02 MED ORDER — HYDROMORPHONE HCL 1 MG/ML IJ SOLN
1.0000 mg | INTRAMUSCULAR | Status: DC | PRN
  Administered 2020-11-02: 1 mg via INTRAVENOUS
  Filled 2020-11-02: qty 1

## 2020-11-02 NOTE — ED Triage Notes (Signed)
Pt was BIBA for RLQ abd pain and unable to void since this morning. Pt has not had a BM x one week. Pt said she took something for constipation but does not remember what it is. Pt screaming and yelling during triage.

## 2020-11-02 NOTE — ED Notes (Signed)
Pt has been hooked up to 2L [place] as her sat drop to  high 80s %. Denies SOB -  GCS 15

## 2020-11-02 NOTE — ED Notes (Signed)
Pt returned from CT °
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
## 2020-11-02 NOTE — ED Notes (Signed)
Notified Janett Billow, RN that patient now has a room assignment and wont be an ED to ED transfer.

## 2020-11-02 NOTE — Assessment & Plan Note (Signed)
   Patient has been transitioned to intravenous PPI twice daily while n.p.o.

## 2020-11-02 NOTE — Assessment & Plan Note (Signed)
   Holding home regimen of low-dose daily prednisone while NPO

## 2020-11-02 NOTE — Assessment & Plan Note (Signed)
Strict intake and output monitoring Creatinine near baseline Minimizing nephrotoxic agents as much as possible Serial chemistries to monitor renal function and electrolytes  

## 2020-11-02 NOTE — Progress Notes (Signed)
Pharmacy Antibiotic Note  [name] is a 65 y.o. female admitted on 11/02/2020 with perf appendix. Pharmacy has been consulted for cefepime dosing. Metronidazole ordered per MD. CrCl ~45.  Plan: Cefepime 2g q12h  Height: 5\' 6"  (167.6 cm) Weight: 65.4 kg (144 lb 2.9 oz) IBW/kg (Calculated) : 59.3  Temp (24hrs), Avg:98 F (36.7 C), Min:97.8 F (36.6 C), Max:98.3 F (36.8 C)  Recent Labs  Lab 11/02/20 0117  WBC 10.0  CREATININE 1.17*    Estimated Creatinine Clearance: 44.3 mL/min (A) (by C-G formula based on SCr of 1.17 mg/dL (H)).    Allergies  Allergen Reactions   Iohexol Anaphylaxis   Iodine-131     Other reaction(s): Other (See Comments) "it draws me up"   Levofloxacin Other (See Comments)    BASELINE PROLONGED QTc.     Zofran [Ondansetron Hcl] Other (See Comments)    BASELINE PROLONGED QTc.   Heparin Itching and Other (See Comments)    Pt is able to tolerate IV heparin, but not sub-Q.   Morphine And Related Itching   Penicillins Itching    Has patient had a PCN reaction causing immediate rash, facial/tongue/throat swelling, SOB or lightheadedness with hypotension: Yes Has patient had a PCN reaction causing severe rash involving mucus membranes or skin necrosis: No Has patient had a PCN reaction that required hospitalization: No Has patient had a PCN reaction occurring within the last 10 years: Yes If all of the above answers are "NO", then may proceed with Cephalosporin use.      Arrie Senate, PharmD, BCPS, [facility] Clinical Pharmacist [phone] Please check AMION for all [employer] numbers 11/02/2020
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
## 2020-11-02 NOTE — ED Notes (Signed)
Report given to  Kara Mead RN  at [facility]

## 2020-11-02 NOTE — Assessment & Plan Note (Signed)
   No clinical evidence of COPD exacerbation this time  As needed bronchodilator therapy for shortness of breath and wheezing.  Continue home regimen of maintenance inhalers.

## 2020-11-02 NOTE — Assessment & Plan Note (Addendum)
   Holding home regimen of oral lipid-lowering therapy while NPO

## 2020-11-02 NOTE — Plan of Care (Signed)
  Problem: Health Behavior/Discharge Planning: Goal: Ability to manage health-related needs will improve Outcome: Progressing   Problem: Clinical Measurements: Goal: Ability to maintain clinical measurements within normal limits will improve Outcome: Progressing Goal: Will remain free from infection Outcome: Progressing Goal: Diagnostic test results will improve Outcome: Progressing Goal: Respiratory complications will improve Outcome: Progressing Goal: Cardiovascular complication will be avoided Outcome: Progressing   Problem: Elimination: Goal: Will not experience complications related to bowel motility Outcome: Progressing Goal: Will not experience complications related to urinary retention Outcome: Progressing   Problem: Safety: Goal: Ability to remain free from injury will improve Outcome: Progressing   Problem: Skin Integrity: Goal: Risk for impaired skin integrity will decrease Outcome: Progressing

## 2020-11-02 NOTE — ED Notes (Signed)
Patient transported to CT 

## 2020-11-02 NOTE — Assessment & Plan Note (Signed)
.   Holding home regimen of oral and hypertensives due to patient being n.p.o. . PRN intravenous antihypertensives for excessively elevated blood pressure

## 2020-11-02 NOTE — Assessment & Plan Note (Signed)
   Patient presenting with severe abdominal pain and tenderness with evidence of foci of free air on CT imaging of the abdomen and pelvis  Clinical and radiographic concern for perforated appendicitis  Patient has been initiated on intravenous antibiotic therapy with ceftriaxone and Flagyl  Hydrating patient with intravenous isotonic fluids  As needed opiate-based analgesics for substantial associated pain  Serial abdominal examinations  ER provider discussed case with Dr. Bobbye Morton with general surgery who has agreed to evaluate the patient in consultation.  Their input is appreciated.

## 2020-11-02 NOTE — Progress Notes (Signed)
Patient arrived on [facility]E via [facility], assessment completed see flow sheet, placed on tele ccmd notified, patient oriented to room and staff, bed in lowest position call bell within reach will continue to monitor.OK to wait till patient voids before collecting urine sample per Shalhoub MD, will bladder scan and obtain order for catheretization if needed.
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
## 2020-11-02 NOTE — Progress Notes (Signed)
Brief note: -Patient was admitted earlier today with concerns for possible perforated appendicitis.  Surgical team is directing care.  Surgical team has ordered repeat imaging studies.  Further management will be as per the surgical team.  We will continue antibiotics for now and and IV fluids.  As per H&P done by Dr. Inda Merlin: "65 year old female with past medical history of polysubstance abuse (cocaine, opiates), hypertension, chronic kidney disease stage IIIa, cocaine induced ischemic colitis (2018, 2020), spinal stenosis, rheumatoid arthritis, gastroesophageal reflux disease who presented to [facility] emergency department with complaints of abdominal pain.   Patient explains that approximately 1 week ago she began to experience abdominal pain.  Abdominal pain was located in the right lower quadrant, severe in intensity, sharp in quality, worse with movement and improved with rest.  Patient denies any associated fevers or vomiting.  Patient denies any recent ingestion of undercooked food, recent travel or recent antibiotic use.   Patient's pain continued to persist until the patient eventually presented to [facility] emergency department for evaluation.   Upon evaluation in the emergency department CT imaging of the abdomen pelvis was performed revealing foci of free intra-abdominal air concerning for perforated viscus.  Radiology felt the source may be perforated appendicitis with a large dilated tubular structure in the right lower quadrant that may represent an inflamed appendix.  Adjacent ileus was also identified.  ER provider discussed case with Dr. Bobbye Morton with general surgery who agreed to evaluate the patient in consultation upon arrival to [facility].  The hospitalist group was then called and the patient was accepted for transfer to [facility] for continued medical care".  -Continue antibiotics. -Continue IV fluids. -Adequate pain  control. -Further management as per surgery team.

## 2020-11-03 ENCOUNTER — Inpatient Hospital Stay (HOSPITAL_COMMUNITY): Payer: Medicare Other | Admitting: Anesthesiology

## 2020-11-03 ENCOUNTER — Encounter (HOSPITAL_COMMUNITY): Payer: Self-pay | Admitting: Family Medicine

## 2020-11-03 ENCOUNTER — Encounter (HOSPITAL_COMMUNITY)
Admission: EM | Disposition: A | Discharge status: Home / Self Care | Payer: Self-pay | Source: Home / Self Care | Attending: Family Medicine

## 2020-11-03 DIAGNOSIS — K3532 Acute appendicitis with perforation and localized peritonitis, without abscess: Secondary | ICD-10-CM | POA: Diagnosis not present

## 2020-11-03 HISTORY — PX: LAPAROSCOPIC APPENDECTOMY: SHX408

## 2020-11-03 LAB — BLOOD CULTURE ID PANEL (REFLEXED) - BCID2

## 2020-11-03 LAB — COMPREHENSIVE METABOLIC PANEL
ALT: 21 U/L (ref 0–44)
AST: 36 U/L (ref 15–41)
Albumin: 2.3 g/dL — ABNORMAL LOW (ref 3.5–5.0)
Alkaline Phosphatase: 74 U/L (ref 38–126)
Anion gap: 13 (ref 5–15)
BUN: 30 mg/dL — ABNORMAL HIGH (ref 8–23)
CO2: 17 mmol/L — ABNORMAL LOW (ref 22–32)
Calcium: 8.4 mg/dL — ABNORMAL LOW (ref 8.9–10.3)
Chloride: 106 mmol/L (ref 98–111)
Creatinine, Ser: 1.98 mg/dL — ABNORMAL HIGH (ref 0.44–1.00)
GFR, Estimated: 27 mL/min — ABNORMAL LOW (ref >60)
Glucose, Bld: 90 mg/dL (ref 70–99)
Potassium: 4.5 mmol/L (ref 3.5–5.1)
Sodium: 136 mmol/L (ref 135–145)
Total Bilirubin: 0.7 mg/dL (ref 0.3–1.2)
Total Protein: 6 g/dL — ABNORMAL LOW (ref 6.5–8.1)

## 2020-11-03 LAB — CBC WITH DIFFERENTIAL/PLATELET
Abs Immature Granulocytes: 0.3 10*3/uL — ABNORMAL HIGH (ref 0.00–0.07)
Basophils Absolute: 0.1 10*3/uL (ref 0.0–0.1)
Basophils Relative: 1 %
Eosinophils Absolute: 0.1 10*3/uL (ref 0.0–0.5)
Eosinophils Relative: 1 %
HCT: 43.5 % (ref 36.0–46.0)
Hemoglobin: 14.7 g/dL (ref 12.0–15.0)
Lymphocytes Relative: 7 %
Lymphs Abs: 1 10*3/uL (ref 0.7–4.0)
MCH: 29.8 pg (ref 26.0–34.0)
MCHC: 33.8 g/dL (ref 30.0–36.0)
MCV: 88.1 fL (ref 80.0–100.0)
Metamyelocytes Relative: 2 %
Monocytes Absolute: 0.6 10*3/uL (ref 0.1–1.0)
Monocytes Relative: 4 %
Neutro Abs: 12.3 10*3/uL — ABNORMAL HIGH (ref 1.7–7.7)
Neutrophils Relative %: 85 %
Platelets: 182 10*3/uL (ref 150–400)
RBC: 4.94 MIL/uL (ref 3.87–5.11)
RDW: 14.2 % (ref 11.5–15.5)
WBC: 14.5 10*3/uL — ABNORMAL HIGH (ref 4.0–10.5)
nRBC: 0 % (ref 0.0–0.2)
nRBC: 0 /100 WBC

## 2020-11-03 LAB — MAGNESIUM: Magnesium: 1.3 mg/dL — ABNORMAL LOW (ref 1.7–2.4)

## 2020-11-03 LAB — TYPE AND SCREEN
ABO/RH(D): B POS
Antibody Screen: NEGATIVE

## 2020-11-03 SURGERY — APPENDECTOMY, LAPAROSCOPIC
Anesthesia: General | Site: Abdomen

## 2020-11-03 MED ORDER — 0.9 % SODIUM CHLORIDE (POUR BTL) OPTIME
TOPICAL | Status: DC | PRN
  Administered 2020-11-03: 1000 mL

## 2020-11-03 MED ORDER — METHOCARBAMOL 1000 MG/10ML IJ SOLN
500.0000 mg | Freq: Four times a day (QID) | INTRAVENOUS | Status: DC | PRN
  Filled 2020-11-03: qty 5

## 2020-11-03 MED ORDER — MAGNESIUM SULFATE 2 GM/50ML IV SOLN
2.0000 g | Freq: Once | INTRAVENOUS | Status: AC
  Administered 2020-11-03: 2 g via INTRAVENOUS
  Filled 2020-11-03: qty 50

## 2020-11-03 MED ORDER — ACETAMINOPHEN 500 MG PO TABS
ORAL_TABLET | ORAL | Status: AC
  Administered 2020-11-03: 1000 mg via ORAL
  Filled 2020-11-03: qty 2

## 2020-11-03 MED ORDER — SODIUM CHLORIDE 0.9 % IV SOLN
12.5000 mg | Freq: Four times a day (QID) | INTRAVENOUS | Status: DC | PRN
  Filled 2020-11-03: qty 0.5

## 2020-11-03 MED ORDER — PROPOFOL 10 MG/ML IV BOLUS
INTRAVENOUS | Status: DC | PRN
  Administered 2020-11-03: 140 mg via INTRAVENOUS

## 2020-11-03 MED ORDER — FENTANYL CITRATE (PF) 100 MCG/2ML IJ SOLN: 25.0000 ug | INTRAMUSCULAR | Status: DC | PRN

## 2020-11-03 MED ORDER — PROPOFOL 500 MG/50ML IV EMUL
INTRAVENOUS | Status: DC | PRN
  Administered 2020-11-03: 25 ug/kg/min via INTRAVENOUS

## 2020-11-03 MED ORDER — ACETAMINOPHEN 500 MG PO TABS: 1000.0000 mg | ORAL_TABLET | Freq: Once | ORAL | Status: AC

## 2020-11-03 MED ORDER — MIDAZOLAM HCL 2 MG/2ML IJ SOLN
INTRAMUSCULAR | Status: AC
  Filled 2020-11-03: qty 2

## 2020-11-03 MED ORDER — LIDOCAINE 2% (20 MG/ML) 5 ML SYRINGE
INTRAMUSCULAR | Status: DC | PRN
  Administered 2020-11-03: 60 mg via INTRAVENOUS

## 2020-11-03 MED ORDER — DEXAMETHASONE SODIUM PHOSPHATE 10 MG/ML IJ SOLN
INTRAMUSCULAR | Status: DC | PRN
Start: 2020-11-03 — End: 2020-11-03
  Administered 2020-11-03: 5 mg via INTRAVENOUS

## 2020-11-03 MED ORDER — ONDANSETRON HCL 4 MG/2ML IJ SOLN
INTRAMUSCULAR | Status: AC
  Filled 2020-11-03: qty 2

## 2020-11-03 MED ORDER — CHLORHEXIDINE GLUCONATE 0.12 % MT SOLN
OROMUCOSAL | Status: AC
  Administered 2020-11-03: 15 mL via OROMUCOSAL
  Filled 2020-11-03: qty 15

## 2020-11-03 MED ORDER — DOCUSATE SODIUM 100 MG PO CAPS
100.0000 mg | ORAL_CAPSULE | Freq: Two times a day (BID) | ORAL | Status: DC
  Administered 2020-11-03 – 2020-11-10 (×14): 100 mg via ORAL
  Filled 2020-11-03 (×13): qty 1

## 2020-11-03 MED ORDER — ACETAMINOPHEN 500 MG PO TABS
1000.0000 mg | ORAL_TABLET | Freq: Three times a day (TID) | ORAL | Status: DC
  Administered 2020-11-03 – 2020-11-08 (×6): 1000 mg via ORAL
  Filled 2020-11-03 (×9): qty 2

## 2020-11-03 MED ORDER — DIPHENHYDRAMINE HCL 25 MG PO CAPS
25.0000 mg | ORAL_CAPSULE | Freq: Four times a day (QID) | ORAL | Status: DC | PRN
  Administered 2020-11-05 – 2020-11-10 (×9): 25 mg via ORAL
  Filled 2020-11-03 (×9): qty 1

## 2020-11-03 MED ORDER — SUGAMMADEX SODIUM 200 MG/2ML IV SOLN
INTRAVENOUS | Status: DC | PRN
  Administered 2020-11-03: 100 mg via INTRAVENOUS
  Administered 2020-11-03: 200 mg via INTRAVENOUS

## 2020-11-03 MED ORDER — FENTANYL CITRATE (PF) 250 MCG/5ML IJ SOLN
INTRAMUSCULAR | Status: AC
  Filled 2020-11-03: qty 5

## 2020-11-03 MED ORDER — ONDANSETRON HCL 4 MG/2ML IJ SOLN: 4.0000 mg | Freq: Once | INTRAMUSCULAR | Status: DC | PRN

## 2020-11-03 MED ORDER — ORAL CARE MOUTH RINSE: 15.0000 mL | Freq: Once | OROMUCOSAL | Status: AC

## 2020-11-03 MED ORDER — BUPIVACAINE-EPINEPHRINE 0.25% -1:200000 IJ SOLN
INTRAMUSCULAR | Status: DC | PRN
  Administered 2020-11-03: 15 mL

## 2020-11-03 MED ORDER — CHLORHEXIDINE GLUCONATE 0.12 % MT SOLN: 15.0000 mL | Freq: Once | OROMUCOSAL | Status: AC

## 2020-11-03 MED ORDER — OXYCODONE HCL 5 MG PO TABS
5.0000 mg | ORAL_TABLET | ORAL | Status: DC | PRN
  Administered 2020-11-03 – 2020-11-04 (×2): 10 mg via ORAL
  Filled 2020-11-03 (×2): qty 2

## 2020-11-03 MED ORDER — FENTANYL CITRATE (PF) 250 MCG/5ML IJ SOLN
INTRAMUSCULAR | Status: DC | PRN
  Administered 2020-11-03: 25 ug via INTRAVENOUS
  Administered 2020-11-03 (×3): 50 ug via INTRAVENOUS
  Administered 2020-11-03: 75 ug via INTRAVENOUS

## 2020-11-03 MED ORDER — PROPOFOL 10 MG/ML IV BOLUS
INTRAVENOUS | Status: AC
  Filled 2020-11-03: qty 20

## 2020-11-03 MED ORDER — ESMOLOL HCL 100 MG/10ML IV SOLN
INTRAVENOUS | Status: DC | PRN
  Administered 2020-11-03 (×2): 20 mg via INTRAVENOUS

## 2020-11-03 MED ORDER — SODIUM CHLORIDE 0.9 % IR SOLN
Status: DC | PRN
  Administered 2020-11-03: 1000 mL

## 2020-11-03 MED ORDER — LACTATED RINGERS IV SOLN: INTRAVENOUS | Status: DC

## 2020-11-03 MED ORDER — POLYETHYLENE GLYCOL 3350 17 G PO PACK: 17.0000 g | PACK | Freq: Every day | ORAL | Status: DC | PRN

## 2020-11-03 MED ORDER — BUPIVACAINE-EPINEPHRINE (PF) 0.25% -1:200000 IJ SOLN
INTRAMUSCULAR | Status: AC
  Filled 2020-11-03: qty 30

## 2020-11-03 MED ORDER — ROCURONIUM BROMIDE 10 MG/ML (PF) SYRINGE
PREFILLED_SYRINGE | INTRAVENOUS | Status: DC | PRN
  Administered 2020-11-03: 50 mg via INTRAVENOUS
  Administered 2020-11-03: 20 mg via INTRAVENOUS

## 2020-11-03 MED ORDER — SUCCINYLCHOLINE CHLORIDE 200 MG/10ML IV SOSY
PREFILLED_SYRINGE | INTRAVENOUS | Status: DC | PRN
  Administered 2020-11-03: 100 mg via INTRAVENOUS

## 2020-11-03 MED ORDER — CEFTRIAXONE SODIUM 2 G IJ SOLR
2.0000 g | INTRAMUSCULAR | Status: DC
  Administered 2020-11-03 – 2020-11-06 (×4): 2 g via INTRAVENOUS
  Filled 2020-11-03 (×5): qty 20

## 2020-11-03 SURGICAL SUPPLY — 47 items
ADH SKN CLS APL DERMABOND .7 (GAUZE/BANDAGES/DRESSINGS) ×1
APL PRP STRL LF DISP 70% ISPRP (MISCELLANEOUS) ×1
BAG COUNTER SPONGE SURGICOUNT (BAG) ×2 IMPLANT
BAG SPEC RTRVL LRG 6X4 10 (ENDOMECHANICALS) ×1
BAG SPNG CNTER NS LX DISP (BAG) ×1
BAG SURGICOUNT SPONGE COUNTING (BAG) ×1
CANISTER SUCT 3000ML PPV (MISCELLANEOUS) ×3 IMPLANT
CHLORAPREP W/TINT 26 (MISCELLANEOUS) ×3 IMPLANT
COVER SURGICAL LIGHT HANDLE (MISCELLANEOUS) ×3 IMPLANT
CUTTER FLEX LINEAR 45M (STAPLE) ×3 IMPLANT
DERMABOND ADVANCED (GAUZE/BANDAGES/DRESSINGS) ×2
DERMABOND ADVANCED .7 DNX12 (GAUZE/BANDAGES/DRESSINGS) ×1 IMPLANT
DRAIN CHANNEL 19F RND (DRAIN) ×3 IMPLANT
ELECT REM PT RETURN 9FT ADLT (ELECTROSURGICAL) ×3
ELECTRODE REM PT RTRN 9FT ADLT (ELECTROSURGICAL) ×1 IMPLANT
EVACUATOR SILICONE 100CC (DRAIN) ×2 IMPLANT
GLOVE SURG ENC MOIS LTX SZ6 (GLOVE) ×3 IMPLANT
GLOVE SURG UNDER LTX SZ6.5 (GLOVE) ×3 IMPLANT
GOWN STRL REUS W/ TWL LRG LVL3 (GOWN DISPOSABLE) ×3 IMPLANT
GOWN STRL REUS W/TWL LRG LVL3 (GOWN DISPOSABLE) ×9
GRASPER SUT TROCAR 14GX15 (MISCELLANEOUS) ×3 IMPLANT
KIT BASIN OR (CUSTOM PROCEDURE TRAY) ×3 IMPLANT
KIT TURNOVER KIT B (KITS) ×3 IMPLANT
NDL INSUFFLATION 14GA 120MM (NEEDLE) ×1 IMPLANT
NEEDLE INSUFFLATION 14GA 120MM (NEEDLE) ×3 IMPLANT
NS IRRIG 1000ML POUR BTL (IV SOLUTION) ×3 IMPLANT
PAD ARMBOARD 7.5X6 YLW CONV (MISCELLANEOUS) ×6 IMPLANT
POUCH SPECIMEN RETRIEVAL 10MM (ENDOMECHANICALS) ×3 IMPLANT
RELOAD 45 VASCULAR/THIN (ENDOMECHANICALS) IMPLANT
RELOAD STAPLE 45 2.5 WHT GRN (ENDOMECHANICALS) IMPLANT
RELOAD STAPLE 45 3.5 BLU ETS (ENDOMECHANICALS) IMPLANT
RELOAD STAPLE TA45 3.5 REG BLU (ENDOMECHANICALS) ×3 IMPLANT
SCISSORS LAP 5X35 DISP (ENDOMECHANICALS) ×2 IMPLANT
SET IRRIG TUBING LAPAROSCOPIC (IRRIGATION / IRRIGATOR) ×3 IMPLANT
SET TUBE SMOKE EVAC HIGH FLOW (TUBING) ×3 IMPLANT
SHEARS HARMONIC ACE PLUS 36CM (ENDOMECHANICALS) ×3 IMPLANT
SLEEVE ENDOPATH XCEL 5M (ENDOMECHANICALS) ×5 IMPLANT
SPECIMEN JAR SMALL (MISCELLANEOUS) ×3 IMPLANT
SUT ETHILON 2 0 FS 18 (SUTURE) ×2 IMPLANT
SUT MNCRL AB 4-0 PS2 18 (SUTURE) ×3 IMPLANT
TOWEL GREEN STERILE FF (TOWEL DISPOSABLE) ×3 IMPLANT
TRAY FOLEY W/BAG SLVR 16FR (SET/KITS/TRAYS/PACK) ×3
TRAY FOLEY W/BAG SLVR 16FR ST (SET/KITS/TRAYS/PACK) ×1 IMPLANT
TRAY LAPAROSCOPIC MC (CUSTOM PROCEDURE TRAY) ×3 IMPLANT
TROCAR XCEL 12X100 BLDLESS (ENDOMECHANICALS) ×3 IMPLANT
TROCAR XCEL NON-BLD 5MMX100MML (ENDOMECHANICALS) ×3 IMPLANT
WATER STERILE IRR 1000ML POUR (IV SOLUTION) ×3 IMPLANT

## 2020-11-03 NOTE — Anesthesia Preprocedure Evaluation (Addendum)
Anesthesia Evaluation  Patient identified by MRN, date of birth, ID band Patient awake    Reviewed: Allergy & Precautions, NPO status , Patient's Chart, lab work & pertinent test results  Airway Mallampati: II  TM Distance: >3 FB Neck ROM: Full    Dental  (+) Dental Advisory Given, Poor Dentition, Missing   Pulmonary COPD,  COPD inhaler, Current Smoker and Patient abstained from smoking.,    Pulmonary exam normal breath sounds clear to auscultation       Cardiovascular hypertension, Pt. on medications + CAD  Normal cardiovascular exam Rhythm:Regular Rate:Normal     Neuro/Psych  Neuromuscular disease    GI/Hepatic GERD  Medicated,(+)     substance abuse  cocaine use, acute appendicitis    Endo/Other  negative endocrine ROS  Renal/GU Renal InsufficiencyRenal disease Bladder dysfunction      Musculoskeletal  (+) Arthritis , Rheumatoid disorders,  narcotic dependent  Abdominal   Peds  Hematology negative hematology ROS (+)   Anesthesia Other Findings Day of surgery medications reviewed with the patient.  Reproductive/Obstetrics                           Anesthesia Physical Anesthesia Plan  ASA: 3  Anesthesia Plan: General   Post-op Pain Management:    Induction: Intravenous  PONV Risk Score and Plan: 3 and Midazolam, Dexamethasone and Ondansetron  Airway Management Planned: Oral ETT  Additional Equipment:   Intra-op Plan:   Post-operative Plan: Extubation in OR  Informed Consent: I have reviewed the patients History and Physical, chart, labs and discussed the procedure including the risks, benefits and alternatives for the proposed anesthesia with the patient or authorized representative who has indicated his/her understanding and acceptance.     Dental advisory given  Plan Discussed with: CRNA  Anesthesia Plan Comments:        Anesthesia Quick Evaluation

## 2020-11-03 NOTE — Anesthesia Procedure Notes (Signed)
Procedure Name: Intubation Date/Time: 11/03/2020 10:30 AM Performed by: Imagene Riches, CRNA Pre-anesthesia Checklist: Patient identified, Emergency Drugs available, Suction available and Patient being monitored Patient Re-evaluated:Patient Re-evaluated prior to induction Oxygen Delivery Method: Circle System Utilized Preoxygenation: Pre-oxygenation with 100% oxygen Induction Type: IV induction Ventilation: Mask ventilation without difficulty Laryngoscope Size: Miller and 3 Grade View: Grade I Tube type: Oral Tube size: 7.0 mm Number of attempts: 1 Airway Equipment and Method: Stylet and Oral airway Placement Confirmation: ETT inserted through vocal cords under direct vision, positive ETCO2 and breath sounds checked- equal and bilateral Secured at: 21 cm Tube secured with: Tape Dental Injury: Teeth and Oropharynx as per pre-operative assessment

## 2020-11-03 NOTE — Transfer of Care (Signed)
Immediate Anesthesia Transfer of Care Note  Patient: [name]  Procedure(s) Performed: APPENDECTOMY LAPAROSCOPIC (Abdomen)  Patient Location: PACU  Anesthesia Type:General  Level of Consciousness: awake and alert   Airway & Oxygen Therapy: Patient Spontanous Breathing and Patient connected to face mask oxygen  Post-op Assessment: Report given to RN and Post -op Vital signs reviewed and stable  Post vital signs: Reviewed and stable  Last Vitals:  Vitals Value Taken Time  BP 184/84 11/03/20 1213  Temp    Pulse 106 11/03/20 1222  Resp 29 11/03/20 1222  SpO2 98 % 11/03/20 1222  Vitals shown include unvalidated device data.  Last Pain:  Vitals:   11/03/20 0957  TempSrc:   PainSc: Asleep      Patients Stated Pain Goal: 0 (29/57/47 3403)  Complications: No notable events documented.

## 2020-11-03 NOTE — Progress Notes (Signed)
PHARMACY - PHYSICIAN COMMUNICATION CRITICAL VALUE ALERT - BLOOD CULTURE IDENTIFICATION (BCID)  [name] is an 65 y.o. female who presented to [facility] on 11/02/2020 with a chief complaint of perforated appendix  Assessment:  WBC 14.5, Last temp 98.8  Name of physician (or Provider) Contacted: Dr Myna Hidalgo  Current antibiotics: Cefepime/Flagyl  Changes to prescribed antibiotics recommended:  Change Cefepime to Ceftriaxone 2g IV q24h Continue Flagyl  Surgery following  Results for orders placed or performed during the hospital encounter of 11/02/20  Blood Culture ID Panel ([facility]) (Collected: 11/02/2020  2:00 AM)  Result Value Ref Range   Enterococcus faecalis NOT DETECTED NOT DETECTED   Enterococcus Faecium NOT DETECTED NOT DETECTED   Listeria monocytogenes NOT DETECTED NOT DETECTED   Staphylococcus species NOT DETECTED NOT DETECTED   Staphylococcus aureus (BCID) NOT DETECTED NOT DETECTED   Staphylococcus epidermidis NOT DETECTED NOT DETECTED   Staphylococcus lugdunensis NOT DETECTED NOT DETECTED   Streptococcus species NOT DETECTED NOT DETECTED   Streptococcus agalactiae NOT DETECTED NOT DETECTED   Streptococcus pneumoniae NOT DETECTED NOT DETECTED   Streptococcus pyogenes NOT DETECTED NOT DETECTED   A.calcoaceticus-baumannii NOT DETECTED NOT DETECTED   Bacteroides fragilis NOT DETECTED NOT DETECTED   Enterobacterales DETECTED (A) NOT DETECTED   Enterobacter cloacae complex NOT DETECTED NOT DETECTED   Escherichia coli DETECTED (A) NOT DETECTED   Klebsiella aerogenes NOT DETECTED NOT DETECTED   Klebsiella oxytoca NOT DETECTED NOT DETECTED   Klebsiella pneumoniae NOT DETECTED NOT DETECTED   Proteus species NOT DETECTED NOT DETECTED   Salmonella species NOT DETECTED NOT DETECTED   Serratia marcescens NOT DETECTED NOT DETECTED   Haemophilus influenzae NOT DETECTED NOT DETECTED   Neisseria meningitidis NOT DETECTED NOT DETECTED   Pseudomonas aeruginosa NOT DETECTED NOT  DETECTED   Stenotrophomonas maltophilia NOT DETECTED NOT DETECTED   Candida albicans NOT DETECTED NOT DETECTED   Candida auris NOT DETECTED NOT DETECTED   Candida glabrata NOT DETECTED NOT DETECTED   Candida krusei NOT DETECTED NOT DETECTED   Candida parapsilosis NOT DETECTED NOT DETECTED   Candida tropicalis NOT DETECTED NOT DETECTED   Cryptococcus neoformans/gattii NOT DETECTED NOT DETECTED   CTX-M ESBL NOT DETECTED NOT DETECTED   Carbapenem resistance IMP NOT DETECTED NOT DETECTED   Carbapenem resistance KPC NOT DETECTED NOT DETECTED   Carbapenem resistance NDM NOT DETECTED NOT DETECTED   Carbapenem resist OXA 48 LIKE NOT DETECTED NOT DETECTED   Carbapenem resistance VIM NOT DETECTED NOT DETECTED    Narda Bonds 11/03/2020  4:26 AM

## 2020-11-03 NOTE — Anesthesia Postprocedure Evaluation (Signed)
Anesthesia Post Note  Patient: Delani Kohli  Procedure(s) Performed: APPENDECTOMY LAPAROSCOPIC (Abdomen)     Patient location during evaluation: PACU Anesthesia Type: General Level of consciousness: awake and alert Pain management: pain level controlled Vital Signs Assessment: post-procedure vital signs reviewed and stable Respiratory status: spontaneous breathing, nonlabored ventilation, respiratory function stable and patient connected to nasal cannula oxygen Cardiovascular status: blood pressure returned to baseline, stable and tachycardic Postop Assessment: no apparent nausea or vomiting Anesthetic complications: no   No notable events documented.  Last Vitals:  Vitals:   11/03/20 1245 11/03/20 1303  BP: 117/77 116/81  Pulse: (!) 104 (!) 104  Resp: (!) 25 20  Temp:    SpO2: 94% 97%    Last Pain:  Vitals:   11/03/20 1245  TempSrc:   PainSc: Asleep                 Catalina Gravel
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
## 2020-11-03 NOTE — Op Note (Signed)
Operative Note  [name]  [id number]  [id number]  11/03/2020   Surgeon: Romana Juniper MD   Assistant: Margie Billet PA-C   Procedure performed: Laparoscopic lysis of adhesions x30 minutes, laparoscopic appendectomy, washout and drain placement  Procedure status: Emergent   Preop diagnosis: Perforated appendicitis  Post-op diagnosis/intraop findings: Same, with feculent and purulent peritonitis   Specimens: Appendix Retained items: 19 French round Blake drain EBL: Minimal cc Complications: none   Description of procedure: After obtaining informed consent the patient was taken to the operating room and placed supine on operating room table where general endotracheal anesthesia was initiated, preoperative antibiotics were administered, SCDs applied, and a formal timeout was performed.  The abdomen was prepped and draped in usual sterile fashion and peritoneal access gained using a left subcostal optical entry.  Insufflation to 15 mmHg ensued and inspection of the abdominal cavity demonstrated no injury from entry, very dilated stomach and colon, and omental adhesions to the anterior abdominal wall and pelvis.  Under direct visualization the left abdominal 12 mm trocar was placed and careful adhesiolysis ensued with combination of harmonic scalpel, blunt and sharp dissection until the anterior abdominal wall was cleared of adhesions sufficiently for placement of additional trochars.  5 mm trochars were placed in the lower midline and supraumbilical midline or direct visualization after infiltration with local.  The cecum and ascending colon were identified and noted to be very dilated but seemingly chronically so.  With careful retraction of the colon and mobilization of the white line of [place] we were able to identify the base of the appendix which was necrotic and we were able to mobilize the appendix from its retrocecal position carefully avoiding any injury to the colon or nearby  retroperitoneal structures.  The appendix is extremely elongated and perforated and at least 2 locations with spillage of stool into the right upper quadrant.  A large abscess cavity was entered in the right upper quadrant and pus in stool evacuated as well as pus over the dome of the liver.  The terminal ileum was identified and noted to be free of any adhesions to the appendix.  A window was created bluntly and the mesoappendix at its base and a blue load linear cutting stapler used to transect the cecum from the appendix just proximal to the necrotic area at the base.  The staple line was inspected and confirmed to be viable, intact and hemostatic.  The mesoappendix was then divided using the harmonic scalpel ensuring hemostasis as we went.  The appendix was then placed in an Endo Catch bag and removed through the left lower quadrant 12 Miller trocar site.  Stool and pus were evacuated and purulent rind debrided from the abdominal wall in the right side.  This area was lightly irrigated and aspirated, the effluent was clear.   CASE DATA:  Type of patient?: DOW CASE (Surgical Hospitalist [facility])  Status of Case? EMERGENT Add On  Infection Present At Time Of Surgery (PATOS)?  FECULENT PERITONITIS and purulent peritonitis A 19 French round Blake drain was placed through the left lower quadrant 12 mm trocar site and directed along the right colic gutter up over the lateral aspect of the liver.  This is secured to the skin with a 2-0 nylon and placed to bulb suction.  The abdomen was desufflated and remaining trochars removed.  Skin incisions were closed with subcuticular 4 Monocryl and Dermabond.  The patient was then awakened, extubated and taken to PACU in stable condition.  All counts were correct at the completion of the case.

## 2020-11-04 ENCOUNTER — Encounter (HOSPITAL_COMMUNITY): Payer: Self-pay | Admitting: Surgery

## 2020-11-04 DIAGNOSIS — K3532 Acute appendicitis with perforation and localized peritonitis, without abscess: Secondary | ICD-10-CM | POA: Diagnosis not present

## 2020-11-04 LAB — COMPREHENSIVE METABOLIC PANEL
ALT: 15 U/L (ref 0–44)
AST: 24 U/L (ref 15–41)
Albumin: 2 g/dL — ABNORMAL LOW (ref 3.5–5.0)
Alkaline Phosphatase: 72 U/L (ref 38–126)
Anion gap: 6 (ref 5–15)
BUN: 28 mg/dL — ABNORMAL HIGH (ref 8–23)
CO2: 19 mmol/L — ABNORMAL LOW (ref 22–32)
Calcium: 8.1 mg/dL — ABNORMAL LOW (ref 8.9–10.3)
Chloride: 110 mmol/L (ref 98–111)
Creatinine, Ser: 1.42 mg/dL — ABNORMAL HIGH (ref 0.44–1.00)
GFR, Estimated: 41 mL/min — ABNORMAL LOW (ref >60)
Glucose, Bld: 90 mg/dL (ref 70–99)
Potassium: 4.8 mmol/L (ref 3.5–5.1)
Sodium: 135 mmol/L (ref 135–145)
Total Bilirubin: 0.7 mg/dL (ref 0.3–1.2)
Total Protein: 5.4 g/dL — ABNORMAL LOW (ref 6.5–8.1)

## 2020-11-04 LAB — SURGICAL PATHOLOGY

## 2020-11-04 LAB — CBC WITH DIFFERENTIAL/PLATELET
Abs Immature Granulocytes: 0 10*3/uL (ref 0.00–0.07)
Basophils Absolute: 0 10*3/uL (ref 0.0–0.1)
Basophils Relative: 0 %
Eosinophils Absolute: 0 10*3/uL (ref 0.0–0.5)
Eosinophils Relative: 0 %
HCT: 34.1 % — ABNORMAL LOW (ref 36.0–46.0)
Hemoglobin: 11.4 g/dL — ABNORMAL LOW (ref 12.0–15.0)
Lymphocytes Relative: 12 %
Lymphs Abs: 1.2 10*3/uL (ref 0.7–4.0)
MCH: 29.5 pg (ref 26.0–34.0)
MCHC: 33.4 g/dL (ref 30.0–36.0)
MCV: 88.3 fL (ref 80.0–100.0)
Monocytes Absolute: 0.3 10*3/uL (ref 0.1–1.0)
Monocytes Relative: 3 %
Neutro Abs: 8.7 10*3/uL — ABNORMAL HIGH (ref 1.7–7.7)
Neutrophils Relative %: 85 %
Platelets: 230 10*3/uL (ref 150–400)
RBC: 3.86 MIL/uL — ABNORMAL LOW (ref 3.87–5.11)
RDW: 14.3 % (ref 11.5–15.5)
WBC: 10.2 10*3/uL (ref 4.0–10.5)
nRBC: 0 % (ref 0.0–0.2)
nRBC: 0 /100 WBC

## 2020-11-04 LAB — MAGNESIUM: Magnesium: 1.6 mg/dL — ABNORMAL LOW (ref 1.7–2.4)

## 2020-11-04 LAB — LIPASE, BLOOD: Lipase: 21 U/L (ref 11–51)

## 2020-11-04 MED ORDER — OXYCODONE HCL 5 MG PO TABS
10.0000 mg | ORAL_TABLET | ORAL | Status: DC | PRN
  Administered 2020-11-04 – 2020-11-07 (×11): 15 mg via ORAL
  Filled 2020-11-04 (×11): qty 3

## 2020-11-04 MED ORDER — MAGNESIUM SULFATE 4 GM/100ML IV SOLN
4.0000 g | Freq: Once | INTRAVENOUS | Status: AC
  Administered 2020-11-04: 4 g via INTRAVENOUS
  Filled 2020-11-04: qty 100

## 2020-11-04 MED ORDER — LIP MEDEX EX OINT
TOPICAL_OINTMENT | CUTANEOUS | Status: DC | PRN
  Administered 2020-11-06: 75 via TOPICAL
  Filled 2020-11-04: qty 7

## 2020-11-04 MED ORDER — FENTANYL CITRATE PF 50 MCG/ML IJ SOSY
25.0000 ug | PREFILLED_SYRINGE | INTRAMUSCULAR | Status: DC | PRN
Start: 2020-11-04 — End: 2020-11-05

## 2020-11-04 MED ORDER — ENOXAPARIN SODIUM 40 MG/0.4ML IJ SOSY
40.0000 mg | PREFILLED_SYRINGE | Freq: Every day | INTRAMUSCULAR | Status: DC
  Administered 2020-11-04 – 2020-11-10 (×7): 40 mg via SUBCUTANEOUS
  Filled 2020-11-04 (×7): qty 0.4
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
## 2020-11-04 NOTE — Evaluation (Signed)
Physical Therapy Evaluation Patient Details Name: [name] MRN: [id number] DOB: 12/08/1955 Today's Date: 11/04/2020  History of Present Illness  Pt is a 65 y.o. female admitted on 11/02/20 for sepsis secondary to perforated appendicitis, ileus, E. coli bacteremia. S/p laproscopic appendectomy 10/24. PMH includes CKD III, HTN, COPD, RA, active smoker.   Clinical Impression  Pt presents with an overall decrease in functional mobility secondary to above. PTA, pt mod indep with rollator, lives in boarding house, reports may be able to have assist from friend at discharge. Educ on abdominal precautions for comfort, positioning, activity recommendations and importance of mobility. Today, pt able to initiate transfer and gait training with RW and intermittent min guard for balance; pt limited by significant abdominal pain. Pt would benefit from continued acute PT services to maximize functional mobility and independence prior to d/c home.    SpO2 88-92% on RA with activity; replaced 2L O2 at end of session   Recommendations for follow up therapy are one component of a multi-disciplinary discharge planning process, led by the attending physician.  Recommendations may be updated based on patient status, additional functional criteria and insurance authorization.  Follow Up Recommendations No PT follow up    Assistance Recommended at Discharge PRN  Functional Status Assessment Patient has had a recent decline in their functional status and demonstrates the ability to make significant improvements in function in a reasonable and predictable amount of time.  Equipment Recommendations  None recommended by PT    Recommendations for Other Services       Precautions / Restrictions Precautions Precautions: Fall;Other (comment) Precaution Comments: abdominal JP drain Restrictions Weight Bearing Restrictions: No      Mobility  Bed Mobility Overal bed mobility: Needs Assistance Bed Mobility:  Rolling;Sidelying to Sit Rolling: Supervision Sidelying to sit: Min assist       General bed mobility comments: Educ on log roll technique, minA for trunk elevation; limited by pain    Transfers Overall transfer level: Needs assistance Equipment used: Rolling walker (2 wheels) Transfers: Sit to/from Stand Sit to Stand: Min guard           General transfer comment: Cues for hand placement, able to stand from EOB and BSC to RW with min guard    Ambulation/Gait Ambulation/Gait assistance: Min guard Gait Distance (Feet): 32 Feet Assistive device: Rolling walker (2 wheels) Gait Pattern/deviations: Step-through pattern;Decreased stride length;Trunk flexed;Antalgic Gait velocity: Decreased   General Gait Details: Slow, antalgic, guarded gait with RW and intermittent min guard for balance; distance limited by pain  Stairs            Wheelchair Mobility    Modified Rankin (Stroke Patients Only)       Balance Overall balance assessment: Needs assistance   Sitting balance-Leahy Scale: Fair       Standing balance-Leahy Scale: Fair Standing balance comment: can static stand without UE support                             Pertinent Vitals/Pain Pain Assessment: Faces Faces Pain Scale: Hurts even more Pain Location: abdomen Pain Descriptors / Indicators: Sore;Grimacing;Guarding Pain Intervention(s): Monitored during session;Limited activity within patient's tolerance    Home Living Family/patient expects to be discharged to:: Private residence Living Arrangements: Alone Available Help at Discharge: Friend(s);Available PRN/intermittently Type of Home: Group Home (boarding house?) Home Access: Stairs to enter Entrance Stairs-Rails: Right Entrance Stairs-Number of Steps: 1 Alternate Level Stairs-Number of Steps: Flight Home  Layout: Two level;Bed/bath upstairs (bedroom main level, bathroom upstairs) Home Equipment: Rollator (4 wheels);Cane - single  point Additional Comments: Sounds like pt resides at boarding house; could have assist from friend potentially    Prior Function Prior Level of Function : Independent/Modified Independent             Mobility Comments: Mod indep with rollator ADLs Comments: Everyone who lives in house assists with household tasks (including cooking, cleaning)     Journalist, newspaper        Extremity/Trunk Assessment   Upper Extremity Assessment Upper Extremity Assessment: Generalized weakness    Lower Extremity Assessment Lower Extremity Assessment: Generalized weakness       Communication   Communication: No difficulties  Cognition Arousal/Alertness: Awake/alert Behavior During Therapy: WFL for tasks assessed/performed;Flat affect Overall Cognitive Status: Within Functional Limits for tasks assessed                                 General Comments: WFL for simple tasks; internally distracted by pain, but able to be redirected well        General Comments General comments (skin integrity, edema, etc.): SpO2 88-91% on RA, replaced 2L O2 at end of session; post-mobility BP 144/82, HR 87-117    Exercises     Assessment/Plan    PT Assessment Patient needs continued PT services  PT Problem List Decreased strength;Decreased activity tolerance;Decreased balance;Decreased mobility;Decreased knowledge of precautions;Pain       PT Treatment Interventions DME instruction;Gait training;Stair training;Functional mobility training;Therapeutic activities;Therapeutic exercise;Balance training;Patient/family education    PT Goals (Current goals can be found in the Care Plan section)  Acute Rehab PT Goals Patient Stated Goal: Decreased pain PT Goal Formulation: With patient Time For Goal Achievement: 11/18/20 Potential to Achieve Goals: Good    Frequency Min 3X/week   Barriers to discharge Decreased caregiver support      Co-evaluation               AM-PAC PT "6  Clicks" Mobility  Outcome Measure Help needed turning from your back to your side while in a flat bed without using bedrails?: None Help needed moving from lying on your back to sitting on the side of a flat bed without using bedrails?: A Little Help needed moving to and from a bed to a chair (including a wheelchair)?: A Little Help needed standing up from a chair using your arms (e.g., wheelchair or bedside chair)?: A Little Help needed to walk in hospital room?: A Little Help needed climbing 3-5 steps with a railing? : A Little 6 Click Score: 19    End of Session Equipment Utilized During Treatment: Oxygen Activity Tolerance: Patient tolerated treatment well;Patient limited by pain Patient left: in chair;with call bell/phone within reach Nurse Communication: Mobility status (chair alarm not set yet) PT Visit Diagnosis: Other abnormalities of gait and mobility (R26.89);Pain    Time: 1694-5038 PT Time Calculation (min) (ACUTE ONLY): 27 min   Charges:   PT Evaluation $PT Eval Moderate Complexity: 1 Mod PT Treatments $Therapeutic Activity: 8-22 mins      Mabeline Caras, PT, DPT Acute Rehabilitation Services  Pager [phone] Office [place] 11/04/2020, 5:23 PM
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
## 2020-11-05 DIAGNOSIS — K3532 Acute appendicitis with perforation and localized peritonitis, without abscess: Secondary | ICD-10-CM | POA: Diagnosis not present

## 2020-11-05 LAB — CBC WITH DIFFERENTIAL/PLATELET
Abs Immature Granulocytes: 0.04 10*3/uL (ref 0.00–0.07)
Basophils Absolute: 0 10*3/uL (ref 0.0–0.1)
Basophils Relative: 0 %
Eosinophils Absolute: 0 10*3/uL (ref 0.0–0.5)
Eosinophils Relative: 0 %
HCT: 34 % — ABNORMAL LOW (ref 36.0–46.0)
Hemoglobin: 11.3 g/dL — ABNORMAL LOW (ref 12.0–15.0)
Immature Granulocytes: 0 %
Lymphocytes Relative: 10 %
Lymphs Abs: 1.1 10*3/uL (ref 0.7–4.0)
MCH: 29 pg (ref 26.0–34.0)
MCHC: 33.2 g/dL (ref 30.0–36.0)
MCV: 87.2 fL (ref 80.0–100.0)
Monocytes Absolute: 0.5 10*3/uL (ref 0.1–1.0)
Monocytes Relative: 5 %
Neutro Abs: 9.5 10*3/uL — ABNORMAL HIGH (ref 1.7–7.7)
Neutrophils Relative %: 85 %
Platelets: 253 10*3/uL (ref 150–400)
RBC: 3.9 MIL/uL (ref 3.87–5.11)
RDW: 14.1 % (ref 11.5–15.5)
WBC: 11.3 10*3/uL — ABNORMAL HIGH (ref 4.0–10.5)
nRBC: 0 % (ref 0.0–0.2)

## 2020-11-05 LAB — COMPREHENSIVE METABOLIC PANEL
ALT: 12 U/L (ref 0–44)
AST: 21 U/L (ref 15–41)
Albumin: 1.9 g/dL — ABNORMAL LOW (ref 3.5–5.0)
Alkaline Phosphatase: 82 U/L (ref 38–126)
Anion gap: 8 (ref 5–15)
BUN: 17 mg/dL (ref 8–23)
CO2: 19 mmol/L — ABNORMAL LOW (ref 22–32)
Calcium: 8.1 mg/dL — ABNORMAL LOW (ref 8.9–10.3)
Chloride: 106 mmol/L (ref 98–111)
Creatinine, Ser: 0.95 mg/dL (ref 0.44–1.00)
GFR, Estimated: >60 mL/min (ref >60)
Glucose, Bld: 116 mg/dL — ABNORMAL HIGH (ref 70–99)
Potassium: 4.3 mmol/L (ref 3.5–5.1)
Sodium: 133 mmol/L — ABNORMAL LOW (ref 135–145)
Total Bilirubin: 0.6 mg/dL (ref 0.3–1.2)
Total Protein: 5.4 g/dL — ABNORMAL LOW (ref 6.5–8.1)

## 2020-11-05 LAB — MAGNESIUM: Magnesium: 1.9 mg/dL (ref 1.7–2.4)

## 2020-11-05 MED ORDER — HYDROMORPHONE HCL 1 MG/ML IJ SOLN
0.5000 mg | INTRAMUSCULAR | Status: DC | PRN
  Administered 2020-11-07 – 2020-11-10 (×10): 0.5 mg via INTRAVENOUS
  Filled 2020-11-05 (×10): qty 1

## 2020-11-05 MED ORDER — METRONIDAZOLE 500 MG PO TABS
500.0000 mg | ORAL_TABLET | Freq: Two times a day (BID) | ORAL | Status: DC
  Administered 2020-11-05 – 2020-11-10 (×11): 500 mg via ORAL
  Filled 2020-11-05 (×11): qty 1

## 2020-11-05 MED ORDER — ENSURE ENLIVE PO LIQD
237.0000 mL | Freq: Two times a day (BID) | ORAL | Status: DC
  Administered 2020-11-05 – 2020-11-09 (×6): 237 mL via ORAL

## 2020-11-05 NOTE — Progress Notes (Signed)
PROGRESS NOTE    [name]  WJX:[id number] DOB: [birth year] DOA: 11/02/2020 PCP: Nolene Ebbs, MD   Brief Narrative:  65 year old female with past medical history of polysubstance abuse (cocaine, opiates), hypertension, chronic kidney disease stage IIIa, cocaine induced ischemic colitis (2018, 2020), spinal stenosis, rheumatoid arthritis, gastroesophageal reflux disease who presented to [facility] emergency department with complaints of abdominal pain.   Upon evaluation in the emergency department CT imaging of the abdomen pelvis was performed revealing foci of free intra-abdominal air concerning for perforated viscus.  Radiology felt the source may be perforated appendicitis with a large dilated tubular structure in the right lower quadrant that may represent an inflamed appendix.  Adjacent ileus was also identified.  ER provider discussed case with Dr. Bobbye Morton with general surgery who agreed to evaluate the patient in consultation upon arrival to [facility].  The hospitalist group was then called and the patient was accepted for transfer to [facility] for continued medical care".  Assessment & Plan:   Principal Problem:   Perforated appendicitis Active Problems:   Rheumatoid arthritis ([name])   Essential hypertension   Mixed hyperlipidemia   COPD (chronic obstructive pulmonary disease) (HCC)   GERD without esophagitis   Chronic kidney disease, stage 3a (HCC)  Sepsis secondary to perforated appendicitis/E. coli bacteremia: Perforated appendicitis confirmed on the CT scan.  Patient now meets sepsis criteria based on tachycardia, tachypnea and leukocytosis.  Sepsis physiology improved.  Seen by general surgery on the morning of 11/03/2020 and taken to the OR emergently for surgical exploration.  Blood culture growing E. coli.  Remains on broad-spectrum antibiotics/Rocephin and Flagyl, ID recommends this as well.  Diet advanced to full liquid diet.  Management  per general surgery.  Essential hypertension: Antihypertensives on hold.  Blood pressure fairly stable.  Will monitor closely and resume medications when indicated.  AKI CKD stage IIIa:  secondary to sepsis.  Creatinine improved and back to baseline.  Metabolic acidosis: CO2 improved and 19 today.  Continue Ringer's lactate but reduce frequency to 75 cc/h.  Repeat labs in the morning.  GERD: Continue PPI.  Hypomagnesemia: Resolved  Hyperlipidemia: Holding statin while NPO.  History of rheumatoid arthritis: Holding home prednisone while she is n.p.o.  History of COPD: Not in acute exacerbation.  Continue as needed bronchodilators.  Continue home regimen.  DVT prophylaxis: enoxaparin (LOVENOX) injection 40 mg Start: 11/04/20 1145 SCDs Start: 11/02/20 0545   Code Status: Full Code  Family Communication: Brother present at bedside.  Plan of care discussed with patient in length and he verbalized understanding and agreed with it.  Status is: Inpatient  Remains inpatient appropriate because: Needs surgical management of perforated appendicitis.   Estimated body mass index is 23.27 kg/m as calculated from the following:   Height as of this encounter: 5\' 6"  (1.676 m).   Weight as of this encounter: 65.4 kg.  Nutritional Assessment: Body mass index is 23.27 kg/m.Marland Kitchen Seen by dietician.  I agree with the assessment and plan as outlined below: Nutrition Status:   Skin Assessment: I have examined the patient's skin and I agree with the wound assessment as performed by the wound care RN as outlined below:    Consultants:  General surgery ID  Procedures:  As above  Antimicrobials:  Anti-infectives (From admission, onward)    Start     Dose/Rate Route Frequency Ordered Stop   11/05/20 1030  metroNIDAZOLE (FLAGYL) tablet 500 mg        500 mg Oral  Every 12 hours 11/05/20 0931 11/11/20 0959   11/03/20 0530  cefTRIAXone (ROCEPHIN) 2 g in sodium chloride 0.9 % 100 mL IVPB        2  g 200 mL/hr over 30 Minutes Intravenous Every 24 hours 11/03/20 0430 11/10/20 2359   11/02/20 1600  ceFEPIme (MAXIPIME) 2 g in sodium chloride 0.9 % 100 mL IVPB  Status:  Discontinued        2 g 200 mL/hr over 30 Minutes Intravenous Every 12 hours 11/02/20 1457 11/03/20 0430   11/02/20 1000  cefTRIAXone (ROCEPHIN) 2 g in sodium chloride 0.9 % 100 mL IVPB  Status:  Discontinued        2 g 200 mL/hr over 30 Minutes Intravenous Every 24 hours 11/02/20 0616 11/02/20 1454   11/02/20 1000  metroNIDAZOLE (FLAGYL) IVPB 500 mg  Status:  Discontinued        500 mg 100 mL/hr over 60 Minutes Intravenous Every 8 hours 11/02/20 0616 11/05/20 0931   11/02/20 0200  ceFEPIme (MAXIPIME) 2 g in sodium chloride 0.9 % 100 mL IVPB       See Hyperspace for full Linked Orders Report.   2 g 200 mL/hr over 30 Minutes Intravenous  Once 11/02/20 0153 11/02/20 0323   11/02/20 0200  metroNIDAZOLE (FLAGYL) IVPB 500 mg       See Hyperspace for full Linked Orders Report.   500 mg 100 mL/hr over 60 Minutes Intravenous  Once 11/02/20 0153 11/02/20 0352          Subjective: Patient seen and examined.  Slightly sleepy.  Feels well with minimal abdominal pain.  No other complaint.  Brother at the bedside.  Objective: Vitals:   11/05/20 0424 11/05/20 0738 11/05/20 0820 11/05/20 1112  BP: (!) 115/91  (!) 159/101 126/89  Pulse: 99  93 99  Resp: 20  18 19   Temp: 97.6 F (36.4 C)  (!) 97.4 F (36.3 C) 98.2 F (36.8 C)  TempSrc: Oral  Oral Oral  SpO2: 93% 94% 93% 92%  Weight:      Height:        Intake/Output Summary (Last 24 hours) at 11/05/2020 1129 Last data filed at 11/05/2020 1000 Gross per 24 hour  Intake 480 ml  Output 1290 ml  Net -810 ml    Filed Weights   11/02/20 0047 11/02/20 0420  Weight: 66.2 kg 65.4 kg    Examination:  General exam: Appears calm and comfortable  Respiratory system: Clear to auscultation. Respiratory effort normal. Cardiovascular system: S1 & S2 heard, RRR. No JVD,  murmurs, rubs, gallops or clicks. No pedal edema. Gastrointestinal system: Abdomen is nondistended, soft and generalized tenderness, more pronounced on the right lower quadrant, drain atleft lower quadrant. No organomegaly or masses felt. Normal bowel sounds heard. Central nervous system: Alert and oriented. No focal neurological deficits. Extremities: Symmetric 5 x 5 power. Skin: No rashes, lesions or ulcers.  Psychiatry: Judgement and insight appear normal. Mood & affect appropriate.    Data Reviewed: I have personally reviewed following labs and imaging studies  CBC: Recent Labs  Lab 11/02/20 0117 11/03/20 0341 11/04/20 0131 11/05/20 0143  WBC 10.0 14.5* 10.2 11.3*  NEUTROABS 8.3* 12.3* 8.7* 9.5*  HGB 13.8 14.7 11.4* 11.3*  HCT 41.6 43.5 34.1* 34.0*  MCV 88.3 88.1 88.3 87.2  PLT 332 182 230 161    Basic Metabolic Panel: Recent Labs  Lab 11/02/20 0117 11/03/20 0602 11/04/20 0131 11/05/20 0143  NA 136 136 135 133*  K  3.7 4.5 4.8 4.3  CL 101 106 110 106  CO2 23 17* 19* 19*  GLUCOSE 116* 90 90 116*  BUN 14 30* 28* 17  CREATININE 1.17* 1.98* 1.42* 0.95  CALCIUM 9.7 8.4* 8.1* 8.1*  MG  --  1.3* 1.6* 1.9    GFR: Estimated Creatinine Clearance: 54.5 mL/min (by C-G formula based on SCr of 0.95 mg/dL). Liver Function Tests: Recent Labs  Lab 11/02/20 0117 11/03/20 0602 11/04/20 0131 11/05/20 0143  AST 11* 36 24 21  ALT 6 21 15 12   ALKPHOS 103 74 72 82  BILITOT 0.6 0.7 0.7 0.6  PROT 7.6 6.0* 5.4* 5.4*  ALBUMIN 3.8 2.3* 2.0* 1.9*    Recent Labs  Lab 11/04/20 0131  LIPASE 21    No results for input(s): AMMONIA in the last 168 hours. Coagulation Profile: Recent Labs  Lab 11/02/20 0653  INR 1.1    Cardiac Enzymes: No results for input(s): CKTOTAL, CKMB, CKMBINDEX, TROPONINI in the last 168 hours. BNP (last 3 results) No results for input(s): PROBNP in the last 8760 hours. HbA1C: No results for input(s): HGBA1C in the last 72 hours. CBG: No results  for input(s): GLUCAP in the last 168 hours. Lipid Profile: No results for input(s): CHOL, HDL, LDLCALC, TRIG, CHOLHDL, LDLDIRECT in the last 72 hours. Thyroid Function Tests: No results for input(s): TSH, T4TOTAL, FREET4, T3FREE, THYROIDAB in the last 72 hours. Anemia Panel: No results for input(s): VITAMINB12, FOLATE, FERRITIN, TIBC, IRON, RETICCTPCT in the last 72 hours. Sepsis Labs: No results for input(s): PROCALCITON, LATICACIDVEN in the last 168 hours.  Recent Results (from the past 240 hour(s))  Resp Panel by RT-PCR (Flu A&B, Covid) Nasopharyngeal Swab     Status: None   Collection Time: 11/02/20  1:46 AM   Specimen: Nasopharyngeal Swab; Nasopharyngeal(NP) swabs in vial transport medium  Result Value Ref Range Status   SARS Coronavirus 2 by RT PCR NEGATIVE NEGATIVE Final    Comment: (NOTE) SARS-CoV-2 target nucleic acids are NOT DETECTED.  The SARS-CoV-2 RNA is generally detectable in upper respiratory specimens during the acute phase of infection. The lowest concentration of SARS-CoV-2 viral copies this assay can detect is 138 copies/mL. A negative result does not preclude SARS-Cov-2 infection and should not be used as the sole basis for treatment or other patient management decisions. A negative result may occur with  improper specimen collection/handling, submission of specimen other than nasopharyngeal swab, presence of viral mutation(s) within the areas targeted by this assay, and inadequate number of viral copies(<138 copies/mL). A negative result must be combined with clinical observations, patient history, and epidemiological information. The expected result is Negative.  Fact Sheet for Patients:  EntrepreneurPulse.com.au  Fact Sheet for Healthcare Providers:  IncredibleEmployment.be  This test is no t yet approved or cleared by the [place] FDA and  has been authorized for detection and/or diagnosis of SARS-CoV-2 by FDA  under an Emergency Use Authorization (EUA). This EUA will remain  in effect (meaning this test can be used) for the duration of the COVID-19 declaration under Section 564(b)(1) of the Act, 21 U.S.C.section 360bbb-3(b)(1), unless the authorization is terminated  or revoked sooner.       Influenza A by PCR NEGATIVE NEGATIVE Final   Influenza B by PCR NEGATIVE NEGATIVE Final    Comment: (NOTE) The Xpert Xpress SARS-CoV-2/FLU/RSV plus assay is intended as an aid in the diagnosis of influenza from Nasopharyngeal swab specimens and should not be used as a sole basis for treatment. Nasal  washings and aspirates are unacceptable for Xpert Xpress SARS-CoV-2/FLU/RSV testing.  Fact Sheet for Patients: EntrepreneurPulse.com.au  Fact Sheet for Healthcare Providers: IncredibleEmployment.be  This test is not yet approved or cleared by the [place] FDA and has been authorized for detection and/or diagnosis of SARS-CoV-2 by FDA under an Emergency Use Authorization (EUA). This EUA will remain in effect (meaning this test can be used) for the duration of the COVID-19 declaration under Section 564(b)(1) of the Act, 21 U.S.C. section 360bbb-3(b)(1), unless the authorization is terminated or revoked.  Performed at [facility], [address], [place], [place] [address]   Blood culture (routine x 2)     Status: Abnormal (Preliminary result)   Collection Time: 11/02/20  2:00 AM   Specimen: BLOOD  Result Value Ref Range Status   Specimen Description   Final    BLOOD Blood Culture adequate volume Performed at [facility], [address], [place], [place] [address]    Special Requests   Final    BLOOD LEFT ARM Performed at [facility], [address], [place], [place] [address]    Culture  Setup Time   Final    GRAM NEGATIVE RODS IN BOTH AEROBIC AND ANAEROBIC BOTTLES CRITICAL RESULT CALLED TO,  READ BACK BY AND VERIFIED WITH: PHARMD JAMES LEDFORD 11/03/20@4 :18 BY TW    Culture (A)  Final    ESCHERICHIA COLI ANAEROBIC GRAM NEGATIVE ROD IDENTIFICATION TO FOLLOW Performed at [facility], [place] [address], [place],  [address]    Report Status PENDING  Incomplete   Organism ID, Bacteria ESCHERICHIA COLI  Final      Susceptibility   Escherichia coli - MIC*    AMPICILLIN >=32 RESISTANT Resistant     CEFAZOLIN <=4 SENSITIVE Sensitive     CEFEPIME <=0.12 SENSITIVE Sensitive     CEFTAZIDIME <=1 SENSITIVE Sensitive     CEFTRIAXONE <=0.25 SENSITIVE Sensitive     CIPROFLOXACIN <=0.25 SENSITIVE Sensitive     GENTAMICIN <=1 SENSITIVE Sensitive     IMIPENEM <=0.25 SENSITIVE Sensitive     TRIMETH/SULFA >=320 RESISTANT Resistant     AMPICILLIN/SULBACTAM >=32 RESISTANT Resistant     PIP/TAZO <=4 SENSITIVE Sensitive     * ESCHERICHIA COLI  Blood Culture ID Panel (Reflexed)     Status: Abnormal   Collection Time: 11/02/20  2:00 AM  Result Value Ref Range Status   Enterococcus faecalis NOT DETECTED NOT DETECTED Final   Enterococcus Faecium NOT DETECTED NOT DETECTED Final   Listeria monocytogenes NOT DETECTED NOT DETECTED Final   Staphylococcus species NOT DETECTED NOT DETECTED Final   Staphylococcus aureus (BCID) NOT DETECTED NOT DETECTED Final   Staphylococcus epidermidis NOT DETECTED NOT DETECTED Final   Staphylococcus lugdunensis NOT DETECTED NOT DETECTED Final   Streptococcus species NOT DETECTED NOT DETECTED Final   Streptococcus agalactiae NOT DETECTED NOT DETECTED Final   Streptococcus pneumoniae NOT DETECTED NOT DETECTED Final   Streptococcus pyogenes NOT DETECTED NOT DETECTED Final   A.calcoaceticus-baumannii NOT DETECTED NOT DETECTED Final   Bacteroides fragilis NOT DETECTED NOT DETECTED Final   Enterobacterales DETECTED (A) NOT DETECTED Final    Comment: Enterobacterales represent a large order of gram negative bacteria, not a single organism. CRITICAL RESULT  CALLED TO, READ BACK BY AND VERIFIED WITH: PHARMD JAMES LEDFORD 11/03/20@4 :18 BY TW    Enterobacter cloacae complex NOT DETECTED NOT DETECTED Final   Escherichia coli DETECTED (A) NOT DETECTED Final    Comment: CRITICAL RESULT CALLED TO, READ BACK BY  AND VERIFIED WITH: PHARMD JAMES LEDFORD 11/03/20@4 :18 BY TW    Klebsiella aerogenes NOT DETECTED NOT DETECTED Final   Klebsiella oxytoca NOT DETECTED NOT DETECTED Final   Klebsiella pneumoniae NOT DETECTED NOT DETECTED Final   Proteus species NOT DETECTED NOT DETECTED Final   Salmonella species NOT DETECTED NOT DETECTED Final   Serratia marcescens NOT DETECTED NOT DETECTED Final   Haemophilus influenzae NOT DETECTED NOT DETECTED Final   Neisseria meningitidis NOT DETECTED NOT DETECTED Final   Pseudomonas aeruginosa NOT DETECTED NOT DETECTED Final   Stenotrophomonas maltophilia NOT DETECTED NOT DETECTED Final   Candida albicans NOT DETECTED NOT DETECTED Final   Candida auris NOT DETECTED NOT DETECTED Final   Candida glabrata NOT DETECTED NOT DETECTED Final   Candida krusei NOT DETECTED NOT DETECTED Final   Candida parapsilosis NOT DETECTED NOT DETECTED Final   Candida tropicalis NOT DETECTED NOT DETECTED Final   Cryptococcus neoformans/gattii NOT DETECTED NOT DETECTED Final   CTX-M ESBL NOT DETECTED NOT DETECTED Final   Carbapenem resistance IMP NOT DETECTED NOT DETECTED Final   Carbapenem resistance KPC NOT DETECTED NOT DETECTED Final   Carbapenem resistance NDM NOT DETECTED NOT DETECTED Final   Carbapenem resist OXA 48 LIKE NOT DETECTED NOT DETECTED Final   Carbapenem resistance VIM NOT DETECTED NOT DETECTED Final    Comment: Performed at [facility], [place] [address], [place], [place] [address]  Blood culture (routine x 2)     Status: None (Preliminary result)   Collection Time: 11/02/20  2:52 AM   Specimen: BLOOD  Result Value Ref Range Status   Specimen Description   Final    BLOOD Blood Culture results may not be optimal  due to an inadequate volume of blood received in culture bottles Performed at [facility], [address] [place], [place] [address]    Special Requests   Final    RIGHT ANTECUBITAL Performed at [facility], [address] [place], [place] [address]    Culture   Final    NO GROWTH 3 DAYS Performed at [facility], [place] [address], [place], [place] [address]    Report Status PENDING  Incomplete       Radiology Studies: No results found.  Scheduled Meds:  acetaminophen  1,000 mg Oral Q8H   docusate sodium  100 mg Oral BID   enoxaparin (LOVENOX) injection  40 mg Subcutaneous Daily   feeding supplement  237 mL Oral BID BM   fluticasone  2 spray Each Nare Daily   fluticasone furoate-vilanterol  1 puff Inhalation Daily   metroNIDAZOLE  500 mg Oral Q12H   pantoprazole (PROTONIX) IV  40 mg Intravenous Q12H   Continuous Infusions:  cefTRIAXone (ROCEPHIN)  IV 2 g (11/05/20 0850)   lactated ringers with kcl 125 mL/hr at 11/05/20 1112   methocarbamol (ROBAXIN) IV     promethazine (PHENERGAN) injection (IM or IVPB)       LOS: 3 days   Time spent: 28 minutes   Darliss Cheney, MD [facility]  11/05/2020, 11:29 AM  Please page via [facility] and do not message via secure chat for anything urgent. Secure chat can be used for anything non urgent.  How to contact the [facility] Attending or Consulting provider [place] or covering provider during after hours [place], for this patient?  Check the care team in [facility] and look for a) attending/consulting [facility] provider listed and b) the [facility] team listed. Page or secure chat [phone].  Log into [email or web address] and use [place] universal password to access. If you do not have the password, please contact the hospital operator. Locate the [facility] provider you are looking for under [facility] and page to a number that you can be directly reached. If you still have difficulty reaching the provider, please  page the [facility] (Director on Call) for the Hospitalists listed on amion for assistance.

## 2020-11-05 NOTE — Progress Notes (Signed)
Physical Therapy Treatment Patient Details Name: [name] MRN: [id number] DOB: 11/01/1955 Today's Date: 11/05/2020   History of Present Illness Pt is a 65 y.o. female admitted on 11/02/20 for sepsis secondary to perforated appendicitis, ileus, E. coli bacteremia. S/p laproscopic appendectomy 10/24. PMH includes CKD III, HTN, COPD, RA, active smoker.   PT Comments    Pt slowly progressing with mobility, limited by c/o significant R-side abdominal pain this session. Pt able to transfer and ambulate in room with rollator and supervision for safety/lines; pt declines further mobility attempts secondary to pain. Educ re: abdominal precautions for comfort, activity recommendations, importance of mobility. Will continue to follow acutely to address established goals.    Recommendations for follow up therapy are one component of a multi-disciplinary discharge planning process, led by the attending physician.  Recommendations may be updated based on patient status, additional functional criteria and insurance authorization.  Follow Up Recommendations  No PT follow up     Assistance Recommended at Discharge PRN  Equipment Recommendations  None recommended by PT    Recommendations for Other Services       Precautions / Restrictions Precautions Precautions: Fall;Other (comment) Precaution Comments: abdominal JP drain Restrictions Weight Bearing Restrictions: No     Mobility  Bed Mobility               General bed mobility comments: Received sitting in recliner    Transfers Overall transfer level: Needs assistance Equipment used: Roolator (4 wheels) Transfers: Sit to/from Stand Sit to Stand: Supervision           General transfer comment: multiple sit<>stands from recliner; pt with good rollator management, including locking/unlocking brakes; supervision for safety/lines    Ambulation/Gait Ambulation/Gait assistance: Supervision Gait Distance (Feet): 38  Feet Assistive device: Rollator (4 wheels) Gait Pattern/deviations: Step-through pattern;Decreased stride length;Trunk flexed;Antalgic Gait velocity: Decreased   General Gait Details: Slow, antalgic, guarded gait with rollator and supervision for safety/lines; pt adamantly declining further distance secondary to pain   Stairs             Wheelchair Mobility    Modified Rankin (Stroke Patients Only)       Balance Overall balance assessment: Needs assistance   Sitting balance-Leahy Scale: Fair       Standing balance-Leahy Scale: Fair Standing balance comment: can static stand without UE support                            Cognition Arousal/Alertness: Awake/alert Behavior During Therapy: WFL for tasks assessed/performed;Flat affect Overall Cognitive Status: Within Functional Limits for tasks assessed                                 General Comments: WFL for simple tasks; internally distracted by pain requiring max encouragement to participate        Exercises      General Comments General comments (skin integrity, edema, etc.): Max education on importance of mobility s/p abdominal surgery      Pertinent Vitals/Pain Pain Assessment: Faces Faces Pain Scale: Hurts even more Pain Location: R-side trunk Pain Descriptors / Indicators: Sore;Grimacing;Guarding Pain Intervention(s): Limited activity within patient's tolerance;Monitored during session    Home Living                          Prior Function  PT Goals (current goals can now be found in the care plan section) Progress towards PT goals: Progressing toward goals    Frequency    Min 3X/week      PT Plan Current plan remains appropriate    Co-evaluation              AM-PAC PT "6 Clicks" Mobility   Outcome Measure  Help needed turning from your back to your side while in a flat bed without using bedrails?: None Help needed moving from  lying on your back to sitting on the side of a flat bed without using bedrails?: A Little Help needed moving to and from a bed to a chair (including a wheelchair)?: A Little Help needed standing up from a chair using your arms (e.g., wheelchair or bedside chair)?: A Little Help needed to walk in hospital room?: A Little Help needed climbing 3-5 steps with a railing? : A Little 6 Click Score: 19    End of Session Equipment Utilized During Treatment: Oxygen Activity Tolerance: Patient limited by pain Patient left: in chair;with call bell/phone within reach;with chair alarm set Nurse Communication: Mobility status PT Visit Diagnosis: Other abnormalities of gait and mobility (R26.89);Pain     Time: 4163-8453 PT Time Calculation (min) (ACUTE ONLY): 21 min  Charges:  $Therapeutic Activity: 8-22 mins                     Mabeline Caras, PT, DPT Acute Rehabilitation Services  Pager [phone] Office [place] 11/05/2020, 12:43 PM

## 2020-11-05 NOTE — Care Management Important Message (Signed)
Important Message  Patient Details  Name: Allecia Bells MRN: [id number] Date of Birth: 1955/06/22   Medicare Important Message Given:  Yes     Raysha Tilmon [address] Circle 11/05/2020, 2:04 PM

## 2020-11-05 NOTE — Progress Notes (Signed)
Patient ID: [name], female   DOB: 01-Oct-1955, 65 y.o.   MRN: [id number] [facility] Surgery Progress Note  2 Days Post-Op  Subjective: CC-  Brother at bedside. Abdomen feels about the same. Continues to have some right sided discomfort. Denies n/v. Passing flatus, no BM. Tolerating clear liquids. Feels hungry.  Objective: Vital signs in last 24 hours: Temp:  [97.4 F (36.3 C)-98.6 F (37 C)] 97.4 F (36.3 C) (10/26 0820) Pulse Rate:  [93-100] 93 (10/26 0820) Resp:  [14-20] 18 (10/26 0820) BP: (99-159)/(59-101) 159/101 (10/26 0820) SpO2:  [90 %-94 %] 93 % (10/26 0820) Last BM Date:  (pt unable to report)  Intake/Output from previous day: 10/25 0701 - 10/26 0700 In: 12 [P.O.:560] Out: 440 [Urine:400; Drains:40] Intake/Output this shift: Total I/O In: -  Out: 350 [Urine:350]  PE: Gen:  Alert, NAD, pleasant Pulm:  rate and effort normal on [place] Abd: Soft, mild distension, mild TTP RLQ and around drain without rebound or guarding, incisions C/D/I, drain with serosanguinous drainage  Lab Results:  Recent Labs    11/04/20 0131 11/05/20 0143  WBC 10.2 11.3*  HGB 11.4* 11.3*  HCT 34.1* 34.0*  PLT 230 253   BMET Recent Labs    11/04/20 0131 11/05/20 0143  NA 135 133*  K 4.8 4.3  CL 110 106  CO2 19* 19*  GLUCOSE 90 116*  BUN 28* 17  CREATININE 1.42* 0.95  CALCIUM 8.1* 8.1*   PT/INR No results for input(s): LABPROT, INR in the last 72 hours. CMP     Component Value Date/Time   NA 133 (L) 11/05/2020 0143   NA 141 06/06/2015 0000   K 4.3 11/05/2020 0143   K 4.7 06/13/2015 0000   CL 106 11/05/2020 0143   CO2 19 (L) 11/05/2020 0143   GLUCOSE 116 (H) 11/05/2020 0143   BUN 17 11/05/2020 0143   BUN 8 06/06/2015 0000   CREATININE 0.95 11/05/2020 0143   CALCIUM 8.1 (L) 11/05/2020 0143   PROT 5.4 (L) 11/05/2020 0143   ALBUMIN 1.9 (L) 11/05/2020 0143   AST 21 11/05/2020 0143   ALT 12 11/05/2020 0143   ALKPHOS 82 11/05/2020 0143   BILITOT 0.6  11/05/2020 0143   GFRNONAA >60 11/05/2020 0143   GFRAA >60 06/28/2018 0535   Lipase     Component Value Date/Time   LIPASE 21 11/04/2020 0131       Studies/Results: No results found.  Anti-infectives: Anti-infectives (From admission, onward)    Start     Dose/Rate Route Frequency Ordered Stop   11/05/20 1030  metroNIDAZOLE (FLAGYL) tablet 500 mg        500 mg Oral Every 12 hours 11/05/20 0931 11/11/20 0959   11/03/20 0530  cefTRIAXone (ROCEPHIN) 2 g in sodium chloride 0.9 % 100 mL IVPB        2 g 200 mL/hr over 30 Minutes Intravenous Every 24 hours 11/03/20 0430 11/10/20 2359   11/02/20 1600  ceFEPIme (MAXIPIME) 2 g in sodium chloride 0.9 % 100 mL IVPB  Status:  Discontinued        2 g 200 mL/hr over 30 Minutes Intravenous Every 12 hours 11/02/20 1457 11/03/20 0430   11/02/20 1000  cefTRIAXone (ROCEPHIN) 2 g in sodium chloride 0.9 % 100 mL IVPB  Status:  Discontinued        2 g 200 mL/hr over 30 Minutes Intravenous Every 24 hours 11/02/20 0616 11/02/20 1454   11/02/20 1000  metroNIDAZOLE (FLAGYL) IVPB 500 mg  Status:  Discontinued        500 mg 100 mL/hr over 60 Minutes Intravenous Every 8 hours 11/02/20 0616 11/05/20 0931   11/02/20 0200  ceFEPIme (MAXIPIME) 2 g in sodium chloride 0.9 % 100 mL IVPB       See Hyperspace for full Linked Orders Report.   2 g 200 mL/hr over 30 Minutes Intravenous  Once 11/02/20 0153 11/02/20 0323   11/02/20 0200  metroNIDAZOLE (FLAGYL) IVPB 500 mg       See Hyperspace for full Linked Orders Report.   500 mg 100 mL/hr over 60 Minutes Intravenous  Once 11/02/20 0153 11/02/20 0352        Assessment/Plan Perforated appendicitis with feculent and purulent peritonitis  POD#2 s/p Laparoscopic lysis of adhesions x30 minutes, laparoscopic appendectomy, washout and drain placement 10/24 Dr. Kae Heller - continue IV antibiotics, plan at least 1 week abx postop - continue JP drain and monitor output - currently serosanguinous - Advance to full  liquids - mobilize, continue PT - no follow up recommended   ID - rocephin/flagyl 10/23>>. WBC up 11.3 from 10.2, afebrile, monitor FEN - IVF, FLD VTE - SCDs, lovenox Foley - none   COPD Rheumatoid arthritis  HTN HLD GERD AKI on CKD - Cr down 0.95 from 1.42 Anemia - Hgb 11.3 from 11.4, stable Tobacco abuse Cocaine use   LOS: 3 days    Wellington Hampshire, [facility] 11/05/2020, 9:39 AM Please see Amion for pager number during day hours 7:00am-4:30pm

## 2020-11-05 NOTE — TOC Initial Note (Signed)
Transition of Care [facility]) - Initial/Assessment Note    Patient Details  Name: [name] MRN: [id number] Date of Birth: 1955/03/29  Transition of [facility]) CM/SW Contact:    Vinie Sill, LCSW Phone Number: 11/05/2020, 11:54 AM  Clinical Narrative:                  CSW met with patient at bedside. CSW introduced self and explained role. Patient informed CSW, she lives in a boarding home with four other roommates. She reports her address is [address] [place], [place] [address].   CSW inquired about needing any resources at discharge once medically stable. Patient states she will need transportation. Patient declined substance abuse use and the need for resources. Patient states no questions or concerns at this time. CSW advised to request CSW if needed. Patient states understanding.   Thurmond Butts, MSW, LCSW Clinical Social Worker       Barriers to Discharge: Continued Medical Work up   Patient Goals and CMS Choice        Expected Discharge Plan and Services                                                Prior Living Arrangements/Services     Patient language and need for interpreter reviewed:: No              Criminal Activity/Legal Involvement Pertinent to Current Situation/Hospitalization: No - Comment as needed  Activities of Daily Living Home Assistive Devices/Equipment: None ADL Screening (condition at time of admission) Patient's cognitive ability adequate to safely complete daily activities?: Yes Is the patient deaf or have difficulty hearing?: No Does the patient have difficulty seeing, even when wearing glasses/contacts?: No Does the patient have difficulty concentrating, remembering, or making decisions?: No Patient able to express need for assistance with ADLs?: No Does the patient have difficulty dressing or bathing?: No Independently performs ADLs?: Yes (appropriate for developmental age) Does the patient have  difficulty walking or climbing stairs?: Yes Weakness of Legs: Both Weakness of Arms/Hands: Both  Permission Sought/Granted   Permission granted to share information with : Yes, Verbal Permission Granted  Share Information with NAME: Annamary Rummage     Permission granted to share info w Relationship: daughter  Permission granted to share info w Contact Information: [phone]  Emotional Assessment   Attitude/Demeanor/Rapport: Engaged Affect (typically observed): Appropriate Orientation: : Oriented to Self, Oriented to Place, Oriented to  Time, Oriented to Situation   Psych Involvement: No (comment)  Admission diagnosis:  Pneumoperitoneum [K66.8] Perforated appendicitis [K35.32] Acute appendicitis with perforation and generalized peritonitis, unspecified whether abscess present, unspecified whether gangrene present [K35.20] Patient Active Problem List   Diagnosis Date Noted   Perforated appendicitis 11/02/2020   Chronic kidney disease, stage 3a ([place]) 11/02/2020   Colitis 06/23/2018   Coronary artery calcification 02/23/2017   Closed nondisplaced fracture of lateral malleolus of right fibula 10/28/2016   Noninfectious gastroenteritis    Hematochezia 09/01/2016   Acute diarrhea 08/28/2016   Hydronephrosis, right 06/29/2015   Hyperkalemia 06/29/2015   HCAP (healthcare-associated pneumonia) 06/29/2015   Peripheral neuropathy 06/29/2015   Candida infection, oral 06/28/2015   Anemia due to other cause 06/10/2015   Hypoalbuminemia due to protein-calorie malnutrition ([place]) 06/10/2015   Ankle fracture, right 06/03/2015   Severe sepsis ([place] [place]) 05/29/2015   Spinal stenosis in cervical region 05/12/2015  S/P cervical spinal fusion 04/17/2015   Acute cystitis without hematuria    Vomiting    Constipation 02/17/2015   CAP (community acquired pneumonia) 02/15/2015   Encephalopathy, metabolic 30/73/5430   Cocaine abuse with cocaine-induced disorder ([place]) 02/14/2015   Sepsis ([place])  10/28/2014   SIRS (systemic inflammatory response syndrome) ([place]) 10/28/2014   Sore throat 10/28/2014   Acute encephalopathy 14/84/0397   Metabolic acidosis    Leukocytosis    Hypotension 09/10/2014   Dehydration 09/10/2014   Chest pain at rest 09/09/2014   Tobacco abuse 09/09/2014   Mixed hyperlipidemia 09/09/2014   COPD (chronic obstructive pulmonary disease) ([place]) 09/09/2014   GERD without esophagitis 09/09/2014   Chest pain 08/20/2014   Nausea & vomiting 08/20/2014   Acute renal failure superimposed on stage 3 chronic kidney disease ([place]) 08/20/2014   Lower urinary tract infectious disease 08/20/2014   Pain in the chest    Pain 02/11/2013   Cocaine abuse ([place]) 02/11/2013   Rheumatoid arthritis flare ([place]) 02/11/2013   Acute renal failure ([place]) 02/11/2013   AKI (acute kidney injury) ([place]) 02/11/2013   Hiatal hernia with gastroesophageal reflux 10/11/2012   Abdominal mass 10/08/2012   Knee pain 10/08/2012   Essential hypertension 10/08/2012   Pancreatic mass 10/07/2012   Abdominal pain 10/07/2012   Rheumatoid arthritis ([place])    PCP:  Nolene Ebbs, MD Pharmacy:   [place], [place] - [address] [place] [address] Phone: [phone] Fax: [phone]     Social Determinants of Health (SDOH) Interventions    Readmission Risk Interventions No flowsheet data found.

## 2020-11-06 DIAGNOSIS — K3532 Acute appendicitis with perforation and localized peritonitis, without abscess: Secondary | ICD-10-CM | POA: Diagnosis not present

## 2020-11-06 LAB — CULTURE, BLOOD (ROUTINE X 2): Specimen Description: ADEQUATE

## 2020-11-06 LAB — CBC WITH DIFFERENTIAL/PLATELET
Abs Immature Granulocytes: 0.1 10*3/uL — ABNORMAL HIGH (ref 0.00–0.07)
Basophils Absolute: 0 10*3/uL (ref 0.0–0.1)
Basophils Relative: 0 %
Eosinophils Absolute: 0.1 10*3/uL (ref 0.0–0.5)
Eosinophils Relative: 1 %
HCT: 40 % (ref 36.0–46.0)
Hemoglobin: 13.5 g/dL (ref 12.0–15.0)
Immature Granulocytes: 1 %
Lymphocytes Relative: 16 %
Lymphs Abs: 1.6 10*3/uL (ref 0.7–4.0)
MCH: 29.7 pg (ref 26.0–34.0)
MCHC: 33.8 g/dL (ref 30.0–36.0)
MCV: 87.9 fL (ref 80.0–100.0)
Monocytes Absolute: 0.9 10*3/uL (ref 0.1–1.0)
Monocytes Relative: 9 %
Neutro Abs: 7.3 10*3/uL (ref 1.7–7.7)
Neutrophils Relative %: 73 %
Platelets: 268 10*3/uL (ref 150–400)
RBC: 4.55 MIL/uL (ref 3.87–5.11)
RDW: 13.9 % (ref 11.5–15.5)
WBC: 10 10*3/uL (ref 4.0–10.5)
nRBC: 0 % (ref 0.0–0.2)

## 2020-11-06 LAB — BASIC METABOLIC PANEL
Anion gap: 9 (ref 5–15)
BUN: 10 mg/dL (ref 8–23)
CO2: 21 mmol/L — ABNORMAL LOW (ref 22–32)
Calcium: 8.4 mg/dL — ABNORMAL LOW (ref 8.9–10.3)
Chloride: 103 mmol/L (ref 98–111)
Creatinine, Ser: 0.95 mg/dL (ref 0.44–1.00)
GFR, Estimated: >60 mL/min (ref >60)
Glucose, Bld: 78 mg/dL (ref 70–99)
Potassium: 4.7 mmol/L (ref 3.5–5.1)
Sodium: 133 mmol/L — ABNORMAL LOW (ref 135–145)

## 2020-11-06 LAB — MAGNESIUM: Magnesium: 1.6 mg/dL — ABNORMAL LOW (ref 1.7–2.4)

## 2020-11-06 MED ORDER — GABAPENTIN 100 MG PO CAPS
100.0000 mg | ORAL_CAPSULE | Freq: Two times a day (BID) | ORAL | Status: DC
  Administered 2020-11-06 – 2020-11-10 (×9): 100 mg via ORAL
  Filled 2020-11-06 (×9): qty 1

## 2020-11-06 MED ORDER — BISACODYL 10 MG RE SUPP
10.0000 mg | Freq: Once | RECTAL | Status: DC
  Filled 2020-11-06: qty 1

## 2020-11-06 MED ORDER — AMLODIPINE BESYLATE 10 MG PO TABS
10.0000 mg | ORAL_TABLET | Freq: Every morning | ORAL | Status: DC
  Administered 2020-11-06 – 2020-11-10 (×5): 10 mg via ORAL
  Filled 2020-11-06 (×4): qty 1

## 2020-11-06 MED ORDER — CEFAZOLIN SODIUM-DEXTROSE 2-4 GM/100ML-% IV SOLN
2.0000 g | Freq: Three times a day (TID) | INTRAVENOUS | Status: DC
  Administered 2020-11-07 – 2020-11-10 (×10): 2 g via INTRAVENOUS
  Filled 2020-11-06 (×11): qty 100

## 2020-11-06 MED ORDER — ATORVASTATIN CALCIUM 40 MG PO TABS
40.0000 mg | ORAL_TABLET | Freq: Every day | ORAL | Status: DC
  Administered 2020-11-06 – 2020-11-09 (×4): 40 mg via ORAL
  Filled 2020-11-06 (×4): qty 1

## 2020-11-06 MED ORDER — MAGNESIUM SULFATE 2 GM/50ML IV SOLN
2.0000 g | Freq: Once | INTRAVENOUS | Status: AC
  Administered 2020-11-06: 2 g via INTRAVENOUS
  Filled 2020-11-06: qty 50

## 2020-11-06 NOTE — Progress Notes (Signed)
Physical Therapy Treatment Patient Details Name: [name] MRN: [id number] DOB: [birth year] Today's Date: 11/06/2020   History of Present Illness Pt is a 65 y.o. female admitted on 11/02/20 for sepsis secondary to perforated appendicitis, ileus, E. coli bacteremia. S/p laproscopic appendectomy 10/24. PMH includes CKD III, HTN, COPD, RA, active smoker.   PT Comments    Pt progressing with mobility. Today's session focused on mobility for improving strength and activity tolerance; pt moving well with rollator, but self-limiting with pain. Max encouragement and education re: importance of mobility, activity recommendations. Appreciate mobility specialists' involvement in encouraging OOB activity. Will continue to follow acutely to address established goals.    Recommendations for follow up therapy are one component of a multi-disciplinary discharge planning process, led by the attending physician.  Recommendations may be updated based on patient status, additional functional criteria and insurance authorization.  Follow Up Recommendations  No PT follow up     Assistance Recommended at Discharge PRN  Equipment Recommendations  None recommended by PT    Recommendations for Other Services       Precautions / Restrictions Precautions Precautions: Fall;Other (comment) Precaution Comments: abdominal JP drain Restrictions Weight Bearing Restrictions: No     Mobility  Bed Mobility Overal bed mobility: Needs Assistance Bed Mobility: Rolling;Sidelying to Sit;Sit to Sidelying Rolling: Independent Sidelying to sit: Min assist     Sit to sidelying: Modified independent (Device/Increase time) General bed mobility comments: MinA for HHA to elevate trunk, pt limited by pain    Transfers Overall transfer level: Needs assistance Equipment used: Rollator (4 wheels) Transfers: Sit to/from Stand Sit to Stand: Modified independent (Device/Increase time)           General transfer  comment: multiple sit<>stands from EOB and BSC, mod indep with rollato    Ambulation/Gait Ambulation/Gait assistance: Supervision Gait Distance (Feet): 110 Feet Assistive device: Rollator (4 wheels) Gait Pattern/deviations: Step-through pattern;Decreased stride length;Trunk flexed;Antalgic Gait velocity: Decreased   General Gait Details: Slow, antalgic, guarded gait with rollator and supervision for safety/lines; pt  declining further distance secondary to pain   Stairs             Wheelchair Mobility    Modified Rankin (Stroke Patients Only)       Balance Overall balance assessment: Needs assistance   Sitting balance-Leahy Scale: Good Sitting balance - Comments: indep with pericare sitting on BSC     Standing balance-Leahy Scale: Fair Standing balance comment: can static stand without UE support                            Cognition Arousal/Alertness: Awake/alert Behavior During Therapy: WFL for tasks assessed/performed;Flat affect Overall Cognitive Status: Within Functional Limits for tasks assessed                                 General Comments: WFL for simple tasks; internally distracted by pain requiring max encouragement to participate        Exercises      General Comments General comments (skin integrity, edema, etc.): Max educ re: importance of mobility, activity recommendations (including walk at least 3x/day with staff)      Pertinent Vitals/Pain Pain Assessment: Faces Faces Pain Scale: Hurts even more Pain Location: R-side trunk Pain Descriptors / Indicators: Sore;Grimacing;Guarding Pain Intervention(s): Patient requesting pain meds-RN notified;Monitored during session    Home Living  Prior Function            PT Goals (current goals can now be found in the care plan section) Progress towards PT goals: Progressing toward goals    Frequency    Min 3X/week      PT  Plan Current plan remains appropriate    Co-evaluation              AM-PAC PT "6 Clicks" Mobility   Outcome Measure  Help needed turning from your back to your side while in a flat bed without using bedrails?: None Help needed moving from lying on your back to sitting on the side of a flat bed without using bedrails?: A Little Help needed moving to and from a bed to a chair (including a wheelchair)?: None Help needed standing up from a chair using your arms (e.g., wheelchair or bedside chair)?: None Help needed to walk in hospital room?: A Little Help needed climbing 3-5 steps with a railing? : A Little 6 Click Score: 21    End of Session   Activity Tolerance: Patient limited by pain Patient left: in bed;with call bell/phone within reach;with bed alarm set Nurse Communication: Mobility status PT Visit Diagnosis: Other abnormalities of gait and mobility (R26.89);Pain     Time: 8099-8338 PT Time Calculation (min) (ACUTE ONLY): 21 min  Charges:  $Therapeutic Exercise: 8-22 mins                     Mabeline Caras, PT, DPT Acute Rehabilitation Services  Pager [phone] Office [place] 11/06/2020, 5:26 PM

## 2020-11-06 NOTE — Progress Notes (Signed)
Patient ID: [name], female   DOB: [birth year], 65 y.o.   MRN: [id number] [facility] Surgery Progress Note  3 Days Post-Op  Subjective: CC-  Still with abdominal pain, no worse but no better compared to yesterday. It is mostly in the RLQ. Pain medication helps. Denies n/v. Tolerating liquids but not eating much - does not have much of an appetite. Continues to pass flatus. No BM.  WBC normalized, afebrile  Objective: Vital signs in last 24 hours: Temp:  [98 F (36.7 C)-98.8 F (37.1 C)] 98.2 F (36.8 C) (10/27 0810) Pulse Rate:  [95-109] 95 (10/27 0810) Resp:  [18-20] 18 (10/27 0810) BP: (126-166)/(74-94) 141/74 (10/27 0810) SpO2:  [92 %-95 %] 93 % (10/27 0946) Last BM Date:  (pt unsure)  Intake/Output from previous day: 10/26 0701 - 10/27 0700 In: 720 [P.O.:720] Out: 1700 [Urine:1650; Drains:50] Intake/Output this shift: No intake/output data recorded.  PE: Gen:  Alert, NAD, pleasant Pulm:  rate and effort normal on  Abd: Soft, mild distension, mild TTP RLQ and around drain without rebound or guarding, incisions C/D/I, drain with serous drainage  Lab Results:  Recent Labs    11/05/20 0143 11/06/20 0125  WBC 11.3* 10.0  HGB 11.3* 13.5  HCT 34.0* 40.0  PLT 253 268   BMET Recent Labs    11/05/20 0143 11/06/20 0125  NA 133* 133*  K 4.3 4.7  CL 106 103  CO2 19* 21*  GLUCOSE 116* 78  BUN 17 10  CREATININE 0.95 0.95  CALCIUM 8.1* 8.4*   PT/INR No results for input(s): LABPROT, INR in the last 72 hours. CMP     Component Value Date/Time   NA 133 (L) 11/06/2020 0125   NA 141 06/06/2015 0000   K 4.7 11/06/2020 0125   K 4.7 06/13/2015 0000   CL 103 11/06/2020 0125   CO2 21 (L) 11/06/2020 0125   GLUCOSE 78 11/06/2020 0125   BUN 10 11/06/2020 0125   BUN 8 06/06/2015 0000   CREATININE 0.95 11/06/2020 0125   CALCIUM 8.4 (L) 11/06/2020 0125   PROT 5.4 (L) 11/05/2020 0143   ALBUMIN 1.9 (L) 11/05/2020 0143   AST 21 11/05/2020 0143   ALT 12  11/05/2020 0143   ALKPHOS 82 11/05/2020 0143   BILITOT 0.6 11/05/2020 0143   GFRNONAA >60 11/06/2020 0125   GFRAA >60 06/28/2018 0535   Lipase     Component Value Date/Time   LIPASE 21 11/04/2020 0131       Studies/Results: No results found.  Anti-infectives: Anti-infectives (From admission, onward)    Start     Dose/Rate Route Frequency Ordered Stop   11/05/20 1030  metroNIDAZOLE (FLAGYL) tablet 500 mg        500 mg Oral Every 12 hours 11/05/20 0931 11/11/20 0959   11/03/20 0530  cefTRIAXone (ROCEPHIN) 2 g in sodium chloride 0.9 % 100 mL IVPB        2 g 200 mL/hr over 30 Minutes Intravenous Every 24 hours 11/03/20 0430 11/10/20 2359   11/02/20 1600  ceFEPIme (MAXIPIME) 2 g in sodium chloride 0.9 % 100 mL IVPB  Status:  Discontinued        2 g 200 mL/hr over 30 Minutes Intravenous Every 12 hours 11/02/20 1457 11/03/20 0430   11/02/20 1000  cefTRIAXone (ROCEPHIN) 2 g in sodium chloride 0.9 % 100 mL IVPB  Status:  Discontinued        2 g 200 mL/hr over 30 Minutes Intravenous Every 24 hours 11/02/20 0616  11/02/20 1454   11/02/20 1000  metroNIDAZOLE (FLAGYL) IVPB 500 mg  Status:  Discontinued        500 mg 100 mL/hr over 60 Minutes Intravenous Every 8 hours 11/02/20 0616 11/05/20 0931   11/02/20 0200  ceFEPIme (MAXIPIME) 2 g in sodium chloride 0.9 % 100 mL IVPB       See Hyperspace for full Linked Orders Report.   2 g 200 mL/hr over 30 Minutes Intravenous  Once 11/02/20 0153 11/02/20 0323   11/02/20 0200  metroNIDAZOLE (FLAGYL) IVPB 500 mg       See Hyperspace for full Linked Orders Report.   500 mg 100 mL/hr over 60 Minutes Intravenous  Once 11/02/20 0153 11/02/20 0352        Assessment/Plan Perforated appendicitis with feculent and purulent peritonitis  POD#3 s/p Laparoscopic lysis of adhesions x30 minutes, laparoscopic appendectomy, washout and drain placement 10/24 Dr. Kae Heller - continue IV antibiotics, needs at least 1 week abx postop and could be switched to oral  at discharge. She does have an E coli bacteremia - abx per primary for this - continue JP drain and monitor output - currently serous - Advance to soft diet, give dulcolax suppository - mobilize, continue PT - no follow up recommended - may be ready for discharge as early as tomorrow   ID - rocephin/flagyl 10/23>>. WBC normalized 10 from up 11.3, afebrile FEN - IVF, soft diet VTE - SCDs, lovenox Foley - none   E colic bacteremia COPD Hypoxia - weaning supplemental O2 Rheumatoid arthritis  HTN HLD GERD AKI on CKD - Cr 0.95, improved Anemia - Hgb 13.5 from 11.3, stable Tobacco abuse Cocaine use   LOS: 4 days    Wellington Hampshire, [facility] 11/06/2020, 10:28 AM Please see Amion for pager number during day hours 7:00am-4:30pm

## 2020-11-06 NOTE — Progress Notes (Signed)
Patient educated on importance of wearing nasal cannula.   Daymon Larsen, RN

## 2020-11-06 NOTE — Progress Notes (Signed)
PROGRESS NOTE    [name]  JEH:[id number] DOB: 02-03-1955 DOA: 11/02/2020 PCP: Nolene Ebbs, MD   Brief Narrative:  65 year old female with past medical history of polysubstance abuse (cocaine, opiates), hypertension, chronic kidney disease stage IIIa, cocaine induced ischemic colitis (2018, 2020), spinal stenosis, rheumatoid arthritis, gastroesophageal reflux disease who presented to [facility] emergency department with complaints of abdominal pain.   Upon evaluation in the emergency department CT imaging of the abdomen pelvis was performed revealing foci of free intra-abdominal air concerning for perforated viscus.  Radiology felt the source may be perforated appendicitis with a large dilated tubular structure in the right lower quadrant that may represent an inflamed appendix.  Adjacent ileus was also identified.  ER provider discussed case with Dr. Bobbye Morton with general surgery who agreed to evaluate the patient in consultation upon arrival to [facility].  The hospitalist group was then called and the patient was accepted for transfer to [facility] for continued medical care".  Assessment & Plan:   Principal Problem:   Perforated appendicitis Active Problems:   Rheumatoid arthritis ([place])   Essential hypertension   Mixed hyperlipidemia   COPD (chronic obstructive pulmonary disease) (HCC)   GERD without esophagitis   Chronic kidney disease, stage 3a (HCC)  Sepsis secondary to perforated appendicitis/E. coli bacteremia, sepsis not present on admission: Perforated appendicitis confirmed on the CT scan.  Patient met sepsis criteria based on tachycardia, tachypnea and leukocytosis.  Sepsis physiology improved.  Seen by general surgery on the morning of 11/03/2020 and taken to the OR emergently for surgical exploration.  Blood culture growing E. coli.  Remains on broad-spectrum antibiotics/Rocephin and Flagyl, ID recommends this as well.  Diet advanced to  soft diet.  Management per general surgery.  Essential hypertension: Antihypertensives on hold.  Blood pressure now creeping up, will resume amlodipine 10 mg p.o. daily.  AKI CKD stage IIIa:  secondary to sepsis.  Creatinine improved and back to baseline.  Metabolic acidosis: CO2 improved and 19 today.  Continue Ringer's lactate @ 75 cc/h.  Repeat labs in the morning.  GERD: Continue PPI.  Hypomagnesemia: Resolved  Hyperlipidemia: Resume statin.  History of rheumatoid arthritis: Holding home prednisone while she is n.p.o.  History of COPD: Not in acute exacerbation.  Continue as needed bronchodilators.  Continue home regimen.  DVT prophylaxis: enoxaparin (LOVENOX) injection 40 mg Start: 11/04/20 1145 SCDs Start: 11/02/20 0545   Code Status: Full Code  Family Communication: None at bedside.  Plan of care discussed with patient in length and he verbalized understanding and agreed with it.  Also discussed with her daughter Lovey Newcomer over the phone.  I gave her heads up that patient may get discharged as early as tomorrow so they can make arrangements since they are being informed 24 hours in advance.  Status is: Inpatient  Remains inpatient appropriate because: Needs surgical management of perforated appendicitis.   Estimated body mass index is 23.27 kg/m as calculated from the following:   Height as of this encounter: _0  (1.676 m).   Weight as of this encounter: 65.4 kg.  Nutritional Assessment: Body mass index is 23.27 kg/m.Marland Kitchen Seen by dietician.  I agree with the assessment and plan as outlined below: Nutrition Status:   Skin Assessment: I have examined the patient's skin and I agree with the wound assessment as performed by the wound care RN as outlined below:    Consultants:  General surgery ID  Procedures:  As above  Antimicrobials:  Anti-infectives (From  admission, onward)    Start     Dose/Rate Route Frequency Ordered Stop   11/05/20 1030  metroNIDAZOLE  (FLAGYL) tablet 500 mg        500 mg Oral Every 12 hours 11/05/20 0931 11/11/20 0959   11/03/20 0530  cefTRIAXone (ROCEPHIN) 2 g in sodium chloride 0.9 % 100 mL IVPB        2 g 200 mL/hr over 30 Minutes Intravenous Every 24 hours 11/03/20 0430 11/10/20 2359   11/02/20 1600  ceFEPIme (MAXIPIME) 2 g in sodium chloride 0.9 % 100 mL IVPB  Status:  Discontinued        2 g 200 mL/hr over 30 Minutes Intravenous Every 12 hours 11/02/20 1457 11/03/20 0430   11/02/20 1000  cefTRIAXone (ROCEPHIN) 2 g in sodium chloride 0.9 % 100 mL IVPB  Status:  Discontinued        2 g 200 mL/hr over 30 Minutes Intravenous Every 24 hours 11/02/20 0616 11/02/20 1454   11/02/20 1000  metroNIDAZOLE (FLAGYL) IVPB 500 mg  Status:  Discontinued        500 mg 100 mL/hr over 60 Minutes Intravenous Every 8 hours 11/02/20 0616 11/05/20 0931   11/02/20 0200  ceFEPIme (MAXIPIME) 2 g in sodium chloride 0.9 % 100 mL IVPB       See Hyperspace for full Linked Orders Report.   2 g 200 mL/hr over 30 Minutes Intravenous  Once 11/02/20 0153 11/02/20 0323   11/02/20 0200  metroNIDAZOLE (FLAGYL) IVPB 500 mg       See Hyperspace for full Linked Orders Report.   500 mg 100 mL/hr over 60 Minutes Intravenous  Once 11/02/20 0153 11/02/20 0352          Subjective: Patient seen and examined.  She states that she feels better, abdominal pain is improving.  No other complaint.  Objective: Vitals:   11/05/20 2318 11/06/20 0336 11/06/20 0810 11/06/20 0946  BP: (!) 150/90 (!) 153/86 (!) 141/74   Pulse: (!) 102 98 95   Resp: _0 Temp: 98 F (36.7 C) 98 F (36.7 C) 98.2 F (36.8 C)   TempSrc: Oral Oral Oral   SpO2: 95% 94% 93% 93%  Weight:      Height:        Intake/Output Summary (Last 24 hours) at 11/06/2020 1235 Last data filed at 11/06/2020 0449 Gross per 24 hour  Intake 240 ml  Output 850 ml  Net -610 ml    Filed Weights   11/02/20 0047 11/02/20 0420  Weight: 66.2 kg 65.4 kg    Examination:  General exam:  Appears calm and comfortable  Respiratory system: Clear to auscultation. Respiratory effort normal. Cardiovascular system: S1 & S2 heard, RRR. No JVD, murmurs, rubs, gallops or clicks. No pedal edema. Gastrointestinal system: Abdomen is nondistended, soft and generalized abdominal tenderness, more pronounced at right lower quadrant but much improved compared to yesterday. No organomegaly or masses felt. Normal bowel sounds heard. Central nervous system: Alert and oriented. No focal neurological deficits. Extremities: Symmetric 5 x 5 power. Skin: No rashes, lesions or ulcers.  Psychiatry: Judgement and insight appear normal. Mood & affect appropriate.    Data Reviewed: I have personally reviewed following labs and imaging studies  CBC: Recent Labs  Lab 11/02/20 0117 11/03/20 0341 11/04/20 0131 11/05/20 0143 11/06/20 0125  WBC 10.0 14.5* 10.2 11.3* 10.0  NEUTROABS 8.3* 12.3* 8.7* 9.5* 7.3  HGB 13.8 14.7 11.4* 11.3* 13.5  HCT 41.6 43.5 34.1*  34.0* 40.0  MCV 88.3 88.1 88.3 87.2 87.9  PLT 332 182 230 253 973    Basic Metabolic Panel: Recent Labs  Lab 11/02/20 0117 11/03/20 0602 11/04/20 0131 11/05/20 0143 11/06/20 0125  NA 136 136 135 133* 133*  K 3.7 4.5 4.8 4.3 4.7  CL 101 106 110 106 103  CO2 23 17* 19* 19* 21*  GLUCOSE 116* 90 90 116* 78  BUN 14 30* 28* 17 10  CREATININE 1.17* 1.98* 1.42* 0.95 0.95  CALCIUM 9.7 8.4* 8.1* 8.1* 8.4*  MG  --  1.3* 1.6* 1.9 1.6*    GFR: Estimated Creatinine Clearance: 54.5 mL/min (by C-G formula based on SCr of 0.95 mg/dL). Liver Function Tests: Recent Labs  Lab 11/02/20 0117 11/03/20 0602 11/04/20 0131 11/05/20 0143  AST 11* 36 24 21  ALT _0 ALKPHOS 103 74 72 82  BILITOT 0.6 0.7 0.7 0.6  PROT 7.6 6.0* 5.4* 5.4*  ALBUMIN 3.8 2.3* 2.0* 1.9*    Recent Labs  Lab 11/04/20 0131  LIPASE 21    No results for input(s): AMMONIA in the last 168 hours. Coagulation Profile: Recent Labs  Lab 11/02/20 0653  INR 1.1     Cardiac Enzymes: No results for input(s): CKTOTAL, CKMB, CKMBINDEX, TROPONINI in the last 168 hours. BNP (last 3 results) No results for input(s): PROBNP in the last 8760 hours. HbA1C: No results for input(s): HGBA1C in the last 72 hours. CBG: No results for input(s): GLUCAP in the last 168 hours. Lipid Profile: No results for input(s): CHOL, HDL, LDLCALC, TRIG, CHOLHDL, LDLDIRECT in the last 72 hours. Thyroid Function Tests: No results for input(s): TSH, T4TOTAL, FREET4, T3FREE, THYROIDAB in the last 72 hours. Anemia Panel: No results for input(s): VITAMINB12, FOLATE, FERRITIN, TIBC, IRON, RETICCTPCT in the last 72 hours. Sepsis Labs: No results for input(s): PROCALCITON, LATICACIDVEN in the last 168 hours.  Recent Results (from the past 240 hour(s))  Resp Panel by RT-PCR (Flu A&B, Covid) Nasopharyngeal Swab     Status: None   Collection Time: 11/02/20  1:46 AM   Specimen: Nasopharyngeal Swab; Nasopharyngeal(NP) swabs in vial transport medium  Result Value Ref Range Status   SARS Coronavirus 2 by RT PCR NEGATIVE NEGATIVE Final    Comment: (NOTE) SARS-CoV-2 target nucleic acids are NOT DETECTED.  The SARS-CoV-2 RNA is generally detectable in upper respiratory specimens during the acute phase of infection. The lowest concentration of SARS-CoV-2 viral copies this assay can detect is 138 copies/mL. A negative result does not preclude SARS-Cov-2 infection and should not be used as the sole basis for treatment or other patient management decisions. A negative result may occur with  improper specimen collection/handling, submission of specimen other than nasopharyngeal swab, presence of viral mutation(s) within the areas targeted by this assay, and inadequate number of viral copies(<138 copies/mL). A negative result must be combined with clinical observations, patient history, and epidemiological information. The expected result is Negative.  Fact Sheet for Patients:   EntrepreneurPulse.com.au  Fact Sheet for Healthcare Providers:  IncredibleEmployment.be  This test is no t yet approved or cleared by the [place] FDA and  has been authorized for detection and/or diagnosis of SARS-CoV-2 by FDA under an Emergency Use Authorization (EUA). This EUA will remain  in effect (meaning this test can be used) for the duration of the COVID-19 declaration under Section 564(b)(1) of the Act, 21 U.S.C.section 360bbb-3(b)(1), unless the authorization is terminated  or revoked sooner.  Influenza A by PCR NEGATIVE NEGATIVE Final   Influenza B by PCR NEGATIVE NEGATIVE Final    Comment: (NOTE) The Xpert Xpress SARS-CoV-2/FLU/RSV plus assay is intended as an aid in the diagnosis of influenza from Nasopharyngeal swab specimens and should not be used as a sole basis for treatment. Nasal washings and aspirates are unacceptable for Xpert Xpress SARS-CoV-2/FLU/RSV testing.  Fact Sheet for Patients: EntrepreneurPulse.com.au  Fact Sheet for Healthcare Providers: IncredibleEmployment.be  This test is not yet approved or cleared by the [place] FDA and has been authorized for detection and/or diagnosis of SARS-CoV-2 by FDA under an Emergency Use Authorization (EUA). This EUA will remain in effect (meaning this test can be used) for the duration of the COVID-19 declaration under Section 564(b)(1) of the Act, 21 U.S.C. section 360bbb-3(b)(1), unless the authorization is terminated or revoked.  Performed at [facility], [address] [place], [place] [address]   Blood culture (routine x 2)     Status: Abnormal   Collection Time: 11/02/20  2:00 AM   Specimen: BLOOD  Result Value Ref Range Status   Specimen Description   Final    BLOOD Blood Culture adequate volume Performed at [facility], [address], [place], [place] [address]     Special Requests   Final    BLOOD LEFT ARM Performed at [facility], [address], [place], [place] [address]    Culture  Setup Time   Final    GRAM NEGATIVE RODS IN BOTH AEROBIC AND ANAEROBIC BOTTLES CRITICAL RESULT CALLED TO, READ BACK BY AND VERIFIED WITH: PHARMD JAMES LEDFORD 11/03/20_0 :18 BY TW    Culture (A)  Final    ESCHERICHIA COLI BACTEROIDES FRAGILIS BETA LACTAMASE POSITIVE Performed at [facility], [place] [address] [place], [place] [address]    Report Status 11/06/2020 FINAL  Final   Organism ID, Bacteria ESCHERICHIA COLI  Final      Susceptibility   Escherichia coli - MIC*    AMPICILLIN >=32 RESISTANT Resistant     CEFAZOLIN <=4 SENSITIVE Sensitive     CEFEPIME <=0.12 SENSITIVE Sensitive     CEFTAZIDIME <=1 SENSITIVE Sensitive     CEFTRIAXONE <=0.25 SENSITIVE Sensitive     CIPROFLOXACIN <=0.25 SENSITIVE Sensitive     GENTAMICIN <=1 SENSITIVE Sensitive     IMIPENEM <=0.25 SENSITIVE Sensitive     TRIMETH/SULFA >=320 RESISTANT Resistant     AMPICILLIN/SULBACTAM >=32 RESISTANT Resistant     PIP/TAZO <=4 SENSITIVE Sensitive     * ESCHERICHIA COLI  Blood Culture ID Panel (Reflexed)     Status: Abnormal   Collection Time: 11/02/20  2:00 AM  Result Value Ref Range Status   Enterococcus faecalis NOT DETECTED NOT DETECTED Final   Enterococcus Faecium NOT DETECTED NOT DETECTED Final   Listeria monocytogenes NOT DETECTED NOT DETECTED Final   Staphylococcus species NOT DETECTED NOT DETECTED Final   Staphylococcus aureus (BCID) NOT DETECTED NOT DETECTED Final   Staphylococcus epidermidis NOT DETECTED NOT DETECTED Final   Staphylococcus lugdunensis NOT DETECTED NOT DETECTED Final   Streptococcus species NOT DETECTED NOT DETECTED Final   Streptococcus agalactiae NOT DETECTED NOT DETECTED Final   Streptococcus pneumoniae NOT DETECTED NOT DETECTED Final   Streptococcus pyogenes NOT DETECTED NOT DETECTED Final   A.calcoaceticus-baumannii NOT  DETECTED NOT DETECTED Final   Bacteroides fragilis NOT DETECTED NOT DETECTED Final   Enterobacterales DETECTED (A) NOT DETECTED Final    Comment: Enterobacterales represent a large order of gram negative bacteria, not  a single organism. CRITICAL RESULT CALLED TO, READ BACK BY AND VERIFIED WITH: PHARMD JAMES LEDFORD 11/03/20_0 :18 BY TW    Enterobacter cloacae complex NOT DETECTED NOT DETECTED Final   Escherichia coli DETECTED (A) NOT DETECTED Final    Comment: CRITICAL RESULT CALLED TO, READ BACK BY AND VERIFIED WITH: PHARMD JAMES LEDFORD 11/03/20_1 :18 BY TW    Klebsiella aerogenes NOT DETECTED NOT DETECTED Final   Klebsiella oxytoca NOT DETECTED NOT DETECTED Final   Klebsiella pneumoniae NOT DETECTED NOT DETECTED Final   Proteus species NOT DETECTED NOT DETECTED Final   Salmonella species NOT DETECTED NOT DETECTED Final   Serratia marcescens NOT DETECTED NOT DETECTED Final   Haemophilus influenzae NOT DETECTED NOT DETECTED Final   Neisseria meningitidis NOT DETECTED NOT DETECTED Final   Pseudomonas aeruginosa NOT DETECTED NOT DETECTED Final   Stenotrophomonas maltophilia NOT DETECTED NOT DETECTED Final   Candida albicans NOT DETECTED NOT DETECTED Final   Candida auris NOT DETECTED NOT DETECTED Final   Candida glabrata NOT DETECTED NOT DETECTED Final   Candida krusei NOT DETECTED NOT DETECTED Final   Candida parapsilosis NOT DETECTED NOT DETECTED Final   Candida tropicalis NOT DETECTED NOT DETECTED Final   Cryptococcus neoformans/gattii NOT DETECTED NOT DETECTED Final   CTX-M ESBL NOT DETECTED NOT DETECTED Final   Carbapenem resistance IMP NOT DETECTED NOT DETECTED Final   Carbapenem resistance KPC NOT DETECTED NOT DETECTED Final   Carbapenem resistance NDM NOT DETECTED NOT DETECTED Final   Carbapenem resist OXA 48 LIKE NOT DETECTED NOT DETECTED Final   Carbapenem resistance VIM NOT DETECTED NOT DETECTED Final    Comment: Performed at [facility], [place] [address]  [place], [place] [address]  Blood culture (routine x 2)     Status: None (Preliminary result)   Collection Time: 11/02/20  2:52 AM   Specimen: BLOOD  Result Value Ref Range Status   Specimen Description   Final    BLOOD Blood Culture results may not be optimal due to an inadequate volume of blood received in culture bottles Performed at [facility], [address] [place], [place] [address]    Special Requests   Final    RIGHT ANTECUBITAL Performed at [facility], [address], [place], [place] [address]    Culture   Final    NO GROWTH 4 DAYS Performed at [facility], [place] [address], [place],  [address]    Report Status PENDING  Incomplete       Radiology Studies: No results found.  Scheduled Meds:  acetaminophen  1,000 mg Oral Q8H   bisacodyl  10 mg Rectal Once   docusate sodium  100 mg Oral BID   enoxaparin (LOVENOX) injection  40 mg Subcutaneous Daily   feeding supplement  237 mL Oral BID BM   fluticasone  2 spray Each Nare Daily   fluticasone furoate-vilanterol  1 puff Inhalation Daily   metroNIDAZOLE  500 mg Oral Q12H   pantoprazole (PROTONIX) IV  40 mg Intravenous Q12H   Continuous Infusions:  cefTRIAXone (ROCEPHIN)  IV 2 g (11/06/20 0737)   lactated ringers with kcl 75 mL/hr at 11/06/20 0233   magnesium sulfate bolus IVPB 2 g (11/06/20 1210)   methocarbamol (ROBAXIN) IV       LOS: 4 days   Time spent: 29 minutes   Darliss Cheney, MD [facility]  11/06/2020, 12:35 PM  Please page via [facility] [place] and do not message via secure chat for anything urgent. Secure  chat can be used for anything non urgent.  How to contact the [facility] Attending or Consulting provider [place] or covering provider during after hours [place], for this patient?  Check the care team in [facility] and look for a) attending/consulting [facility] provider listed and b) the [facility] team listed. Page or secure chat [phone]. Log into [email or web address] and use [facility]'s universal password to access. If you do not have the password, please contact the hospital operator. Locate the [facility] provider you are looking for under [facility] and page to a number that you can be directly reached. If you still have difficulty reaching the provider, please page the [facility] (Director on Call) for the Hospitalists listed on amion for assistance.

## 2020-11-06 NOTE — Progress Notes (Signed)
Mobility Specialist: Progress Note   11/06/20 1206  Mobility  Activity Ambulated in hall  Level of Assistance Modified independent, requires aide device or extra time  Assistive Device Four wheel walker  Distance Ambulated (ft) 100 ft  Mobility Ambulated with assistance in hallway  Mobility Response Tolerated well  Mobility performed by Mobility specialist  Bed Position Chair  $Mobility charge 1 Mobility   Post-Mobility: 99 HR  Pt ambulated on 2 L/min [place]. Pt to [facility], void successful and then agreeable to ambulation. Pt had no c/o during ambulation. Pt to recliner after walk with call bell and phone in reach. RN present in the room.   [facility] Lyah Millirons Mobility Specialist Mobility Specialist Phone: [phone]
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
## 2020-11-07 ENCOUNTER — Inpatient Hospital Stay (HOSPITAL_COMMUNITY): Payer: Medicare Other

## 2020-11-07 DIAGNOSIS — K3532 Acute appendicitis with perforation and localized peritonitis, without abscess: Secondary | ICD-10-CM | POA: Diagnosis not present

## 2020-11-07 LAB — CBC WITH DIFFERENTIAL/PLATELET
Abs Immature Granulocytes: 0.1 10*3/uL — ABNORMAL HIGH (ref 0.00–0.07)
Basophils Absolute: 0.2 10*3/uL — ABNORMAL HIGH (ref 0.0–0.1)
Basophils Relative: 2 %
Eosinophils Absolute: 0.2 10*3/uL (ref 0.0–0.5)
Eosinophils Relative: 2 %
HCT: 33.3 % — ABNORMAL LOW (ref 36.0–46.0)
Hemoglobin: 11.3 g/dL — ABNORMAL LOW (ref 12.0–15.0)
Lymphocytes Relative: 25 %
Lymphs Abs: 2.2 10*3/uL (ref 0.7–4.0)
MCH: 29.6 pg (ref 26.0–34.0)
MCHC: 33.9 g/dL (ref 30.0–36.0)
MCV: 87.2 fL (ref 80.0–100.0)
Monocytes Absolute: 1.1 10*3/uL — ABNORMAL HIGH (ref 0.1–1.0)
Monocytes Relative: 13 %
Neutro Abs: 5 10*3/uL (ref 1.7–7.7)
Neutrophils Relative %: 57 %
Platelets: 277 10*3/uL (ref 150–400)
Promyelocytes Relative: 1 %
RBC: 3.82 MIL/uL — ABNORMAL LOW (ref 3.87–5.11)
RDW: 13.9 % (ref 11.5–15.5)
WBC: 8.7 10*3/uL (ref 4.0–10.5)
nRBC: 0 % (ref 0.0–0.2)
nRBC: 0 /100 WBC

## 2020-11-07 LAB — CULTURE, BLOOD (ROUTINE X 2): Culture: NO GROWTH

## 2020-11-07 MED ORDER — OXYCODONE HCL 5 MG PO TABS
10.0000 mg | ORAL_TABLET | Freq: Four times a day (QID) | ORAL | Status: DC | PRN
  Administered 2020-11-07 (×2): 15 mg via ORAL
  Administered 2020-11-08: 10 mg via ORAL
  Administered 2020-11-08 – 2020-11-10 (×9): 15 mg via ORAL
  Filled 2020-11-07 (×9): qty 3
  Filled 2020-11-07: qty 2
  Filled 2020-11-07 (×2): qty 3

## 2020-11-07 NOTE — Progress Notes (Signed)
Progress Note  4 Days Post-Op  Subjective: Patient reports she is still having a lot of R sided abdominal pain, although it is subjectively maybe a little better. She is passing flatus but has not had a BM. She reports nausea occasionally.   Objective: Vital signs in last 24 hours: Temp:  [98.4 F (36.9 C)-98.7 F (37.1 C)] 98.6 F (37 C) (10/28 0350) Pulse Rate:  [95-100] 97 (10/28 0350) Resp:  [16-20] 16 (10/28 0350) BP: (114-139)/(69-75) 134/75 (10/28 0350) SpO2:  [92 %-94 %] 93 % (10/28 0350) Weight:  [66.1 kg] 66.1 kg (10/28 0350) Last BM Date: 11/01/20  Intake/Output from previous day: 10/27 0701 - 10/28 0700 In: 4321 [I.V.:3977.7; IV Piggyback:343.3] Out: 2120 [Urine:2100; Drains:20] Intake/Output this shift: No intake/output data recorded.  PE: Gen:  Alert, NAD, pleasant Pulm:  rate and effort normal on [place] Abd: Soft, mild distension, ttp along RLQ and RUQ, incisions C/D/I, drain with serous drainage   Lab Results:  Recent Labs    11/06/20 0125 11/07/20 0202  WBC 10.0 8.7  HGB 13.5 11.3*  HCT 40.0 33.3*  PLT 268 277   BMET Recent Labs    11/05/20 0143 11/06/20 0125  NA 133* 133*  K 4.3 4.7  CL 106 103  CO2 19* 21*  GLUCOSE 116* 78  BUN 17 10  CREATININE 0.95 0.95  CALCIUM 8.1* 8.4*   PT/INR No results for input(s): LABPROT, INR in the last 72 hours. CMP     Component Value Date/Time   NA 133 (L) 11/06/2020 0125   NA 141 06/06/2015 0000   K 4.7 11/06/2020 0125   K 4.7 06/13/2015 0000   CL 103 11/06/2020 0125   CO2 21 (L) 11/06/2020 0125   GLUCOSE 78 11/06/2020 0125   BUN 10 11/06/2020 0125   BUN 8 06/06/2015 0000   CREATININE 0.95 11/06/2020 0125   CALCIUM 8.4 (L) 11/06/2020 0125   PROT 5.4 (L) 11/05/2020 0143   ALBUMIN 1.9 (L) 11/05/2020 0143   AST 21 11/05/2020 0143   ALT 12 11/05/2020 0143   ALKPHOS 82 11/05/2020 0143   BILITOT 0.6 11/05/2020 0143   GFRNONAA >60 11/06/2020 0125   GFRAA >60 06/28/2018 0535   Lipase      Component Value Date/Time   LIPASE 21 11/04/2020 0131       Studies/Results: No results found.  Anti-infectives: Anti-infectives (From admission, onward)    Start     Dose/Rate Route Frequency Ordered Stop   11/07/20 0600  ceFAZolin (ANCEF) IVPB 2g/100 mL premix        2 g 200 mL/hr over 30 Minutes Intravenous Every 8 hours 11/06/20 1518 11/16/20 2359   11/05/20 1030  metroNIDAZOLE (FLAGYL) tablet 500 mg        500 mg Oral Every 12 hours 11/05/20 0931 11/16/20 2359   11/03/20 0530  cefTRIAXone (ROCEPHIN) 2 g in sodium chloride 0.9 % 100 mL IVPB  Status:  Discontinued        2 g 200 mL/hr over 30 Minutes Intravenous Every 24 hours 11/03/20 0430 11/06/20 1518   11/02/20 1600  ceFEPIme (MAXIPIME) 2 g in sodium chloride 0.9 % 100 mL IVPB  Status:  Discontinued        2 g 200 mL/hr over 30 Minutes Intravenous Every 12 hours 11/02/20 1457 11/03/20 0430   11/02/20 1000  cefTRIAXone (ROCEPHIN) 2 g in sodium chloride 0.9 % 100 mL IVPB  Status:  Discontinued        2 g  200 mL/hr over 30 Minutes Intravenous Every 24 hours 11/02/20 0616 11/02/20 1454   11/02/20 1000  metroNIDAZOLE (FLAGYL) IVPB 500 mg  Status:  Discontinued        500 mg 100 mL/hr over 60 Minutes Intravenous Every 8 hours 11/02/20 0616 11/05/20 0931   11/02/20 0200  ceFEPIme (MAXIPIME) 2 g in sodium chloride 0.9 % 100 mL IVPB       See Hyperspace for full Linked Orders Report.   2 g 200 mL/hr over 30 Minutes Intravenous  Once 11/02/20 0153 11/02/20 0323   11/02/20 0200  metroNIDAZOLE (FLAGYL) IVPB 500 mg       See Hyperspace for full Linked Orders Report.   500 mg 100 mL/hr over 60 Minutes Intravenous  Once 11/02/20 0153 11/02/20 0352        Assessment/Plan Perforated appendicitis with feculent and purulent peritonitis  POD#4 s/p Laparoscopic lysis of adhesions x30 minutes, laparoscopic appendectomy, washout and drain placement 10/24 Dr. Kae Heller - continue IV antibiotics, needs at least 1 week abx postop and could  be switched to oral at discharge. She does have an E coli bacteremia - abx per primary for this - continue JP drain and monitor output - currently serous - tolerating diet ok but still with a lot of abdominal ttp - CT AP today to r/o post-operative abscess - mobilize, continue PT - no follow up recommended   ID - rocephin/flagyl 10/23>>. WBC normalized 8 from 11.3, afebrile FEN - IVF, NPO for CT today  VTE - SCDs, lovenox Foley - none   E colic bacteremia COPD Hypoxia - weaning supplemental O2 Rheumatoid arthritis  HTN HLD GERD AKI on CKD - Cr 0.95 yesterday, improved Anemia - Hgb 11.3, stable Tobacco abuse Cocaine use  LOS: 5 days    Norm Parcel, [facility] 11/07/2020, 8:32 AM Please see Amion for pager number during day hours 7:00am-4:30pm

## 2020-11-07 NOTE — Progress Notes (Signed)
PROGRESS NOTE    [name]  GHW:[id number] DOB: 1955/07/24 DOA: 11/02/2020 PCP: Nolene Ebbs, MD   Brief Narrative:  65 year old female with past medical history of polysubstance abuse (cocaine, opiates), hypertension, chronic kidney disease stage IIIa, cocaine induced ischemic colitis (2018, 2020), spinal stenosis, rheumatoid arthritis, gastroesophageal reflux disease who presented to [place] emergency department with complaints of abdominal pain.   Upon evaluation in the emergency department CT imaging of the abdomen pelvis was performed revealing foci of free intra-abdominal air concerning for perforated viscus.  Radiology felt the source may be perforated appendicitis with a large dilated tubular structure in the right lower quadrant that may represent an inflamed appendix.  Adjacent ileus was also identified.  ER provider discussed case with Dr. Bobbye Morton with general surgery who agreed to evaluate the patient in consultation upon arrival to [facility].  The hospitalist group was then called and the patient was accepted for transfer to [facility] for continued medical care".  Assessment & Plan:   Principal Problem:   Perforated appendicitis Active Problems:   Rheumatoid arthritis ([place])   Essential hypertension   Mixed hyperlipidemia   COPD (chronic obstructive pulmonary disease) (HCC)   GERD without esophagitis   Chronic kidney disease, stage 3a (HCC)  Sepsis secondary to perforated appendicitis/E. coli bacteremia, sepsis not present on admission: Perforated appendicitis confirmed on the CT scan.  Patient met sepsis criteria based on tachycardia, tachypnea and leukocytosis.  Sepsis physiology improved.  Seen by general surgery on the morning of 11/03/2020 and taken to the OR emergently for surgical exploration.  Blood culture growing E. coli.  Antibiotics switched to cefazolin and Flagyl today.  ID recommends transitioning to oral cefadroxil plus  oral metronidazole at the time of discharge to complete total 14 days since the surgery.  Due to pain, CT abdomen and pelvis is ordered by general surgery.  She is n.p.o. for that for now.  Essential hypertension: Blood pressure controlled, continue amlodipine 10 mg p.o. daily.  AKI CKD stage IIIa:  secondary to sepsis.  Creatinine improved and back to baseline.  Metabolic acidosis: CO2 improving.  Continue Ringer's lactate @ 75 cc/h.  Repeat labs in the morning.  GERD: Continue PPI.  Hypomagnesemia: Resolved  Hyperlipidemia: Continue statin.  History of rheumatoid arthritis: Holding home prednisone while she is n.p.o.  History of COPD: Not in acute exacerbation.  Continue as needed bronchodilators.  Continue home regimen.  DVT prophylaxis: enoxaparin (LOVENOX) injection 40 mg Start: 11/04/20 1145 SCDs Start: 11/02/20 0545   Code Status: Full Code  Family Communication: None at bedside.  Plan of care discussed with patient.  Status is: Inpatient  Remains inpatient appropriate because: Needs surgical management of perforated appendicitis.   Estimated body mass index is 23.52 kg/m as calculated from the following:   Height as of this encounter: 5\' 6"  (1.676 m).   Weight as of this encounter: 66.1 kg.  Nutritional Assessment: Body mass index is 23.52 kg/m.Marland Kitchen Seen by dietician.  I agree with the assessment and plan as outlined below: Nutrition Status:   Skin Assessment: I have examined the patient's skin and I agree with the wound assessment as performed by the wound care RN as outlined below:    Consultants:  General surgery ID  Procedures:  As above  Antimicrobials:  Anti-infectives (From admission, onward)    Start     Dose/Rate Route Frequency Ordered Stop   11/07/20 0600  ceFAZolin (ANCEF) IVPB 2g/100 mL premix  2 g 200 mL/hr over 30 Minutes Intravenous Every 8 hours 11/06/20 1518 11/16/20 2359   11/05/20 1030  metroNIDAZOLE (FLAGYL) tablet 500 mg         500 mg Oral Every 12 hours 11/05/20 0931 11/16/20 2359   11/03/20 0530  cefTRIAXone (ROCEPHIN) 2 g in sodium chloride 0.9 % 100 mL IVPB  Status:  Discontinued        2 g 200 mL/hr over 30 Minutes Intravenous Every 24 hours 11/03/20 0430 11/06/20 1518   11/02/20 1600  ceFEPIme (MAXIPIME) 2 g in sodium chloride 0.9 % 100 mL IVPB  Status:  Discontinued        2 g 200 mL/hr over 30 Minutes Intravenous Every 12 hours 11/02/20 1457 11/03/20 0430   11/02/20 1000  cefTRIAXone (ROCEPHIN) 2 g in sodium chloride 0.9 % 100 mL IVPB  Status:  Discontinued        2 g 200 mL/hr over 30 Minutes Intravenous Every 24 hours 11/02/20 0616 11/02/20 1454   11/02/20 1000  metroNIDAZOLE (FLAGYL) IVPB 500 mg  Status:  Discontinued        500 mg 100 mL/hr over 60 Minutes Intravenous Every 8 hours 11/02/20 0616 11/05/20 0931   11/02/20 0200  ceFEPIme (MAXIPIME) 2 g in sodium chloride 0.9 % 100 mL IVPB       See Hyperspace for full Linked Orders Report.   2 g 200 mL/hr over 30 Minutes Intravenous  Once 11/02/20 0153 11/02/20 0323   11/02/20 0200  metroNIDAZOLE (FLAGYL) IVPB 500 mg       See Hyperspace for full Linked Orders Report.   500 mg 100 mL/hr over 60 Minutes Intravenous  Once 11/02/20 0153 11/02/20 0352          Subjective:  Patient seen and examined, she states that she is not feeling well, she is having slightly increased abdominal pain. No other complaint.   Objective: Vitals:   11/06/20 2017 11/06/20 2337 11/07/20 0350 11/07/20 0834  BP: 129/69 114/75 134/75 115/84  Pulse: 100 99 97 (!) 107  Resp: $Remo'19 16 16 18  'lNUUI$ Temp: 98.7 F (37.1 C) 98.4 F (36.9 C) 98.6 F (37 C) 98 F (36.7 C)  TempSrc: Oral Oral Oral Oral  SpO2: 94% 94% 93% 95%  Weight:   66.1 kg   Height:        Intake/Output Summary (Last 24 hours) at 11/07/2020 1153 Last data filed at 11/07/2020 0532 Gross per 24 hour  Intake 4320.98 ml  Output 2120 ml  Net 2200.98 ml    Filed Weights   11/02/20 0047 11/02/20 0420  11/07/20 0350  Weight: 66.2 kg 65.4 kg 66.1 kg    Examination:  General exam: Appears calm and comfortable  Respiratory system: Clear to auscultation. Respiratory effort normal. Cardiovascular system: S1 & S2 heard, RRR. No JVD, murmurs, rubs, gallops or clicks. No pedal edema. Gastrointestinal system: Abdomen is nondistended, soft and generalized abdominal tenderness. No organomegaly or masses felt. Normal bowel sounds heard. Central nervous system: Alert and oriented. No focal neurological deficits. Extremities: Symmetric 5 x 5 power. Skin: No rashes, lesions or ulcers.  Psychiatry: Judgement and insight appear normal. Mood & affect appropriate.    Data Reviewed: I have personally reviewed following labs and imaging studies  CBC: Recent Labs  Lab 11/03/20 0341 11/04/20 0131 11/05/20 0143 11/06/20 0125 11/07/20 0202  WBC 14.5* 10.2 11.3* 10.0 8.7  NEUTROABS 12.3* 8.7* 9.5* 7.3 5.0  HGB 14.7 11.4* 11.3* 13.5 11.3*  HCT 43.5  34.1* 34.0* 40.0 33.3*  MCV 88.1 88.3 87.2 87.9 87.2  PLT 182 230 253 268 825    Basic Metabolic Panel: Recent Labs  Lab 11/02/20 0117 11/03/20 0602 11/04/20 0131 11/05/20 0143 11/06/20 0125  NA 136 136 135 133* 133*  K 3.7 4.5 4.8 4.3 4.7  CL 101 106 110 106 103  CO2 23 17* 19* 19* 21*  GLUCOSE 116* 90 90 116* 78  BUN 14 30* 28* 17 10  CREATININE 1.17* 1.98* 1.42* 0.95 0.95  CALCIUM 9.7 8.4* 8.1* 8.1* 8.4*  MG  --  1.3* 1.6* 1.9 1.6*    GFR: Estimated Creatinine Clearance: 54.5 mL/min (by C-G formula based on SCr of 0.95 mg/dL). Liver Function Tests: Recent Labs  Lab 11/02/20 0117 11/03/20 0602 11/04/20 0131 11/05/20 0143  AST 11* 36 24 21  ALT $Re'6 21 15 12  'PRJ$ ALKPHOS 103 74 72 82  BILITOT 0.6 0.7 0.7 0.6  PROT 7.6 6.0* 5.4* 5.4*  ALBUMIN 3.8 2.3* 2.0* 1.9*    Recent Labs  Lab 11/04/20 0131  LIPASE 21    No results for input(s): AMMONIA in the last 168 hours. Coagulation Profile: Recent Labs  Lab 11/02/20 0653  INR 1.1     Cardiac Enzymes: No results for input(s): CKTOTAL, CKMB, CKMBINDEX, TROPONINI in the last 168 hours. BNP (last 3 results) No results for input(s): PROBNP in the last 8760 hours. HbA1C: No results for input(s): HGBA1C in the last 72 hours. CBG: No results for input(s): GLUCAP in the last 168 hours. Lipid Profile: No results for input(s): CHOL, HDL, LDLCALC, TRIG, CHOLHDL, LDLDIRECT in the last 72 hours. Thyroid Function Tests: No results for input(s): TSH, T4TOTAL, FREET4, T3FREE, THYROIDAB in the last 72 hours. Anemia Panel: No results for input(s): VITAMINB12, FOLATE, FERRITIN, TIBC, IRON, RETICCTPCT in the last 72 hours. Sepsis Labs: No results for input(s): PROCALCITON, LATICACIDVEN in the last 168 hours.  Recent Results (from the past 240 hour(s))  Resp Panel by RT-PCR (Flu A&B, Covid) Nasopharyngeal Swab     Status: None   Collection Time: 11/02/20  1:46 AM   Specimen: Nasopharyngeal Swab; Nasopharyngeal(NP) swabs in vial transport medium  Result Value Ref Range Status   SARS Coronavirus 2 by RT PCR NEGATIVE NEGATIVE Final    Comment: (NOTE) SARS-CoV-2 target nucleic acids are NOT DETECTED.  The SARS-CoV-2 RNA is generally detectable in upper respiratory specimens during the acute phase of infection. The lowest concentration of SARS-CoV-2 viral copies this assay can detect is 138 copies/mL. A negative result does not preclude SARS-Cov-2 infection and should not be used as the sole basis for treatment or other patient management decisions. A negative result may occur with  improper specimen collection/handling, submission of specimen other than nasopharyngeal swab, presence of viral mutation(s) within the areas targeted by this assay, and inadequate number of viral copies(<138 copies/mL). A negative result must be combined with clinical observations, patient history, and epidemiological information. The expected result is Negative.  Fact Sheet for Patients:   EntrepreneurPulse.com.au  Fact Sheet for Healthcare Providers:  IncredibleEmployment.be  This test is no t yet approved or cleared by the [place] FDA and  has been authorized for detection and/or diagnosis of SARS-CoV-2 by FDA under an Emergency Use Authorization (EUA). This EUA will remain  in effect (meaning this test can be used) for the duration of the COVID-19 declaration under Section 564(b)(1) of the Act, 21 U.S.C.section 360bbb-3(b)(1), unless the authorization is terminated  or revoked sooner.  Influenza A by PCR NEGATIVE NEGATIVE Final   Influenza B by PCR NEGATIVE NEGATIVE Final    Comment: (NOTE) The Xpert Xpress SARS-CoV-2/FLU/RSV plus assay is intended as an aid in the diagnosis of influenza from Nasopharyngeal swab specimens and should not be used as a sole basis for treatment. Nasal washings and aspirates are unacceptable for Xpert Xpress SARS-CoV-2/FLU/RSV testing.  Fact Sheet for Patients: EntrepreneurPulse.com.au  Fact Sheet for Healthcare Providers: IncredibleEmployment.be  This test is not yet approved or cleared by the [place] FDA and has been authorized for detection and/or diagnosis of SARS-CoV-2 by FDA under an Emergency Use Authorization (EUA). This EUA will remain in effect (meaning this test can be used) for the duration of the COVID-19 declaration under Section 564(b)(1) of the Act, 21 U.S.C. section 360bbb-3(b)(1), unless the authorization is terminated or revoked.  Performed at [facility], [address] [place], [place] [address]   Blood culture (routine x 2)     Status: Abnormal   Collection Time: 11/02/20  2:00 AM   Specimen: BLOOD  Result Value Ref Range Status   Specimen Description   Final    BLOOD Blood Culture adequate volume Performed at [facility], [address], [place], [place] [address]     Special Requests   Final    BLOOD LEFT ARM Performed at [facility], [address], [place], [place] [address]    Culture  Setup Time   Final    GRAM NEGATIVE RODS IN BOTH AEROBIC AND ANAEROBIC BOTTLES CRITICAL RESULT CALLED TO, READ BACK BY AND VERIFIED WITH: PHARMD JAMES LEDFORD 11/03/20_0 :18 BY TW    Culture (A)  Final    ESCHERICHIA COLI BACTEROIDES FRAGILIS BETA LACTAMASE POSITIVE Performed at [facility], [place] [address] [place], [place] [address]    Report Status 11/06/2020 FINAL  Final   Organism ID, Bacteria ESCHERICHIA COLI  Final      Susceptibility   Escherichia coli - MIC*    AMPICILLIN >=32 RESISTANT Resistant     CEFAZOLIN <=4 SENSITIVE Sensitive     CEFEPIME <=0.12 SENSITIVE Sensitive     CEFTAZIDIME <=1 SENSITIVE Sensitive     CEFTRIAXONE <=0.25 SENSITIVE Sensitive     CIPROFLOXACIN <=0.25 SENSITIVE Sensitive     GENTAMICIN <=1 SENSITIVE Sensitive     IMIPENEM <=0.25 SENSITIVE Sensitive     TRIMETH/SULFA >=320 RESISTANT Resistant     AMPICILLIN/SULBACTAM >=32 RESISTANT Resistant     PIP/TAZO <=4 SENSITIVE Sensitive     * ESCHERICHIA COLI  Blood Culture ID Panel (Reflexed)     Status: Abnormal   Collection Time: 11/02/20  2:00 AM  Result Value Ref Range Status   Enterococcus faecalis NOT DETECTED NOT DETECTED Final   Enterococcus Faecium NOT DETECTED NOT DETECTED Final   Listeria monocytogenes NOT DETECTED NOT DETECTED Final   Staphylococcus species NOT DETECTED NOT DETECTED Final   Staphylococcus aureus (BCID) NOT DETECTED NOT DETECTED Final   Staphylococcus epidermidis NOT DETECTED NOT DETECTED Final   Staphylococcus lugdunensis NOT DETECTED NOT DETECTED Final   Streptococcus species NOT DETECTED NOT DETECTED Final   Streptococcus agalactiae NOT DETECTED NOT DETECTED Final   Streptococcus pneumoniae NOT DETECTED NOT DETECTED Final   Streptococcus pyogenes NOT DETECTED NOT DETECTED Final   A.calcoaceticus-baumannii NOT  DETECTED NOT DETECTED Final   Bacteroides fragilis NOT DETECTED NOT DETECTED Final   Enterobacterales DETECTED (A) NOT DETECTED Final    Comment: Enterobacterales represent a large order of gram negative bacteria, not  a single organism. CRITICAL RESULT CALLED TO, READ BACK BY AND VERIFIED WITH: PHARMD JAMES LEDFORD 11/03/20@4 :18 BY TW    Enterobacter cloacae complex NOT DETECTED NOT DETECTED Final   Escherichia coli DETECTED (A) NOT DETECTED Final    Comment: CRITICAL RESULT CALLED TO, READ BACK BY AND VERIFIED WITH: PHARMD JAMES LEDFORD 11/03/20@4 :18 BY TW    Klebsiella aerogenes NOT DETECTED NOT DETECTED Final   Klebsiella oxytoca NOT DETECTED NOT DETECTED Final   Klebsiella pneumoniae NOT DETECTED NOT DETECTED Final   Proteus species NOT DETECTED NOT DETECTED Final   Salmonella species NOT DETECTED NOT DETECTED Final   Serratia marcescens NOT DETECTED NOT DETECTED Final   Haemophilus influenzae NOT DETECTED NOT DETECTED Final   Neisseria meningitidis NOT DETECTED NOT DETECTED Final   Pseudomonas aeruginosa NOT DETECTED NOT DETECTED Final   Stenotrophomonas maltophilia NOT DETECTED NOT DETECTED Final   Candida albicans NOT DETECTED NOT DETECTED Final   Candida auris NOT DETECTED NOT DETECTED Final   Candida glabrata NOT DETECTED NOT DETECTED Final   Candida krusei NOT DETECTED NOT DETECTED Final   Candida parapsilosis NOT DETECTED NOT DETECTED Final   Candida tropicalis NOT DETECTED NOT DETECTED Final   Cryptococcus neoformans/gattii NOT DETECTED NOT DETECTED Final   CTX-M ESBL NOT DETECTED NOT DETECTED Final   Carbapenem resistance IMP NOT DETECTED NOT DETECTED Final   Carbapenem resistance KPC NOT DETECTED NOT DETECTED Final   Carbapenem resistance NDM NOT DETECTED NOT DETECTED Final   Carbapenem resist OXA 48 LIKE NOT DETECTED NOT DETECTED Final   Carbapenem resistance VIM NOT DETECTED NOT DETECTED Final    Comment: Performed at [facility], [address],  [place], [place] [address]  Blood culture (routine x 2)     Status: None   Collection Time: 11/02/20  2:52 AM   Specimen: BLOOD  Result Value Ref Range Status   Specimen Description   Final    BLOOD Blood Culture results may not be optimal due to an inadequate volume of blood received in culture bottles Performed at [facility], [address] [place], [place] [address]    Special Requests   Final    RIGHT ANTECUBITAL Performed at [facility], [address], [place], [place] [address]    Culture   Final    NO GROWTH 5 DAYS Performed at [facility], [place] [address] [place], [place] [address]    Report Status 11/07/2020 FINAL  Final       Radiology Studies: No results found.  Scheduled Meds:  acetaminophen  1,000 mg Oral Q8H   amLODipine  10 mg Oral q morning   atorvastatin  40 mg Oral q1800   bisacodyl  10 mg Rectal Once   docusate sodium  100 mg Oral BID   enoxaparin (LOVENOX) injection  40 mg Subcutaneous Daily   feeding supplement  237 mL Oral BID BM   fluticasone  2 spray Each Nare Daily   fluticasone furoate-vilanterol  1 puff Inhalation Daily   gabapentin  100 mg Oral BID   metroNIDAZOLE  500 mg Oral Q12H   pantoprazole (PROTONIX) IV  40 mg Intravenous Q12H   Continuous Infusions:   ceFAZolin (ANCEF) IV 2 g (11/07/20 0531)   lactated ringers with kcl 75 mL/hr at 11/07/20 0120   methocarbamol (ROBAXIN) IV       LOS: 5 days   Time spent: 28 minutes   Darliss Cheney, MD [facility]  11/07/2020, 11:53 AM  Please  page via [place] and do not message via secure chat for anything urgent. Secure chat can be used for anything non urgent.  How to contact the [facility] Attending or Consulting provider [place] or covering provider during after hours [place], for this patient?  Check the care team in [facility] and look for a) attending/consulting TRH provider listed and b) the [facility] team listed. Page or secure chat [phone]. Log into  [email or web address] and use [place]'s universal password to access. If you do not have the password, please contact the hospital operator. Locate the [facility] provider you are looking for under [facility] and page to a number that you can be directly reached. If you still have difficulty reaching the provider, please page the [facility] (Director on Call) for the Hospitalists listed on amion for assistance.

## 2020-11-07 NOTE — Progress Notes (Signed)
[facility] for Infectious Disease    Date of Admission:  11/02/2020   Total days of antibiotics 7 (day 1 cefazolin/day 7 metronidazole)          ID: [name] is a 65 y.o. female with perforated appy, with ecoli and bacteroides bacteremia Principal Problem:   Perforated appendicitis Active Problems:   Rheumatoid arthritis ([place])   Essential hypertension   Mixed hyperlipidemia   COPD (chronic obstructive pulmonary disease) (HCC)   GERD without esophagitis   Chronic kidney disease, stage 3a (HCC)    Subjective: Afebrile, but still significant abdominal pain. RUQ mostly this morning. Passing alittle bit of gas. No dysuria  Medications:   acetaminophen  1,000 mg Oral Q8H   amLODipine  10 mg Oral q morning   atorvastatin  40 mg Oral q1800   bisacodyl  10 mg Rectal Once   docusate sodium  100 mg Oral BID   enoxaparin (LOVENOX) injection  40 mg Subcutaneous Daily   feeding supplement  237 mL Oral BID BM   fluticasone  2 spray Each Nare Daily   fluticasone furoate-vilanterol  1 puff Inhalation Daily   gabapentin  100 mg Oral BID   metroNIDAZOLE  500 mg Oral Q12H   pantoprazole (PROTONIX) IV  40 mg Intravenous Q12H    Objective: Vital signs in last 24 hours: Temp:  [98 F (36.7 C)-98.7 F (37.1 C)] 98 F (36.7 C) (10/28 0834) Pulse Rate:  [95-107] 107 (10/28 0834) Resp:  [16-20] 18 (10/28 0834) BP: (114-139)/(69-84) 115/84 (10/28 0834) SpO2:  [92 %-95 %] 95 % (10/28 0834) Weight:  [66.1 kg] 66.1 kg (10/28 0350)  Physical Exam  Constitutional:  oriented to person, place, and time. appears well-developed and well-nourished. No distress.  HENT: [place]/AT, PERRLA, no scleral icterus Mouth/Throat: Oropharynx is clear and moist. No oropharyngeal exudate.  Cardiovascular: Normal rate, regular rhythm and normal heart sounds. Exam reveals no gallop and no friction rub.  No murmur heard.  Pulmonary/Chest: Effort normal and breath sounds normal. No respiratory distress.   has no wheezes.  Neck = supple, no nuchal rigidity Abdominal: Soft. Bowel sounds are normal.  exhibits  some distension. +RUQ tenderness. Serous fluid in LLQ drain Lymphadenopathy: no cervical adenopathy. No axillary adenopathy Neurological: alert and oriented to person, place, and time.  Skin: Skin is warm and dry. No rash noted. No erythema.  Psychiatric: a normal mood and affect.  behavior is normal.    Lab Results Recent Labs    11/05/20 0143 11/06/20 0125 11/07/20 0202  WBC 11.3* 10.0 8.7  HGB 11.3* 13.5 11.3*  HCT 34.0* 40.0 33.3*  NA 133* 133*  --   K 4.3 4.7  --   CL 106 103  --   CO2 19* 21*  --   BUN 17 10  --   CREATININE 0.95 0.95  --    Liver Panel Recent Labs    11/05/20 0143  PROT 5.4*  ALBUMIN 1.9*  AST 21  ALT 12  ALKPHOS 82  BILITOT 0.6     Microbiology: E.coli and b.fragilis Studies/Results: No results found.   Assessment/Plan: Polymicrobial bacteremia secondary to perforated appendix s/p lap appy and jp drain placement- continue on cefazolin while hospitalized and can transition to oral cefodroxil 1000mg  BID plus metronidazole 500mg  po BID for a total of 14  day using 10/25 as day 1-   Abdominal pain = would recommend repeat abd ct to ensure no residual intra-abdominal abscess, perforated on admission.(PATOS on OR  note)  Will see back on Monday if still hospitalized.  [facility] for Infectious Diseases Pager: [phone]  11/07/2020, 10:39 AM

## 2020-11-07 NOTE — Care Management Important Message (Signed)
Important Message  Patient Details  Name: [name] MRN: [id number] Date of Birth: [birth year]   Medicare Important Message Given:  Yes     Shelda Altes 11/07/2020, 10:50 AM

## 2020-11-08 DIAGNOSIS — K3532 Acute appendicitis with perforation and localized peritonitis, without abscess: Secondary | ICD-10-CM | POA: Diagnosis not present

## 2020-11-08 LAB — CBC WITH DIFFERENTIAL/PLATELET
Abs Immature Granulocytes: 0.24 10*3/uL — ABNORMAL HIGH (ref 0.00–0.07)
Basophils Absolute: 0 10*3/uL (ref 0.0–0.1)
Basophils Relative: 0 %
Eosinophils Absolute: 0 10*3/uL (ref 0.0–0.5)
Eosinophils Relative: 0 %
HCT: 34.8 % — ABNORMAL LOW (ref 36.0–46.0)
Hemoglobin: 11.5 g/dL — ABNORMAL LOW (ref 12.0–15.0)
Immature Granulocytes: 3 %
Lymphocytes Relative: 18 %
Lymphs Abs: 1.6 10*3/uL (ref 0.7–4.0)
MCH: 29.2 pg (ref 26.0–34.0)
MCHC: 33 g/dL (ref 30.0–36.0)
MCV: 88.3 fL (ref 80.0–100.0)
Monocytes Absolute: 1.5 10*3/uL — ABNORMAL HIGH (ref 0.1–1.0)
Monocytes Relative: 18 %
Neutro Abs: 5.2 10*3/uL (ref 1.7–7.7)
Neutrophils Relative %: 61 %
Platelets: 332 10*3/uL (ref 150–400)
RBC: 3.94 MIL/uL (ref 3.87–5.11)
RDW: 14.1 % (ref 11.5–15.5)
WBC: 8.6 10*3/uL (ref 4.0–10.5)
nRBC: 0 % (ref 0.0–0.2)

## 2020-11-08 LAB — BASIC METABOLIC PANEL
Anion gap: 6 (ref 5–15)
BUN: 6 mg/dL — ABNORMAL LOW (ref 8–23)
CO2: 27 mmol/L (ref 22–32)
Calcium: 8.1 mg/dL — ABNORMAL LOW (ref 8.9–10.3)
Chloride: 97 mmol/L — ABNORMAL LOW (ref 98–111)
Creatinine, Ser: 0.86 mg/dL (ref 0.44–1.00)
GFR, Estimated: >60 mL/min (ref >60)
Glucose, Bld: 106 mg/dL — ABNORMAL HIGH (ref 70–99)
Potassium: 4.3 mmol/L (ref 3.5–5.1)
Sodium: 130 mmol/L — ABNORMAL LOW (ref 135–145)

## 2020-11-08 NOTE — Progress Notes (Signed)
PROGRESS NOTE    [name]  SHF:[id number] DOB: [birth year] DOA: 11/02/2020 PCP: Nolene Ebbs, MD   Brief Narrative:  65 year old female with past medical history of polysubstance abuse (cocaine, opiates), hypertension, chronic kidney disease stage IIIa, cocaine induced ischemic colitis (2018, 2020), spinal stenosis, rheumatoid arthritis, gastroesophageal reflux disease who presented to [place] emergency department with complaints of abdominal pain.   Upon evaluation in the emergency department CT imaging of the abdomen pelvis was performed revealing foci of free intra-abdominal air concerning for perforated viscus.  Radiology felt the source may be perforated appendicitis with a large dilated tubular structure in the right lower quadrant that may represent an inflamed appendix.  Adjacent ileus was also identified.  ER provider discussed case with Dr. Bobbye Morton with general surgery who agreed to evaluate the patient in consultation upon arrival to [facility].  The hospitalist group was then called and the patient was accepted for transfer to [facility] for continued medical care".  Assessment & Plan:   Principal Problem:   Perforated appendicitis Active Problems:   Rheumatoid arthritis ([place])   Essential hypertension   Mixed hyperlipidemia   COPD (chronic obstructive pulmonary disease) (HCC)   GERD without esophagitis   Chronic kidney disease, stage 3a (HCC)  Sepsis secondary to perforated appendicitis/E. coli bacteremia, sepsis not present on admission: Perforated appendicitis confirmed on the CT scan.  Patient met sepsis criteria based on tachycardia, tachypnea and leukocytosis.  Sepsis physiology improved.  Seen by general surgery on the morning of 11/03/2020 and taken to the OR emergently for surgical exploration.  Blood culture growing E. coli.  Antibiotics switched to cefazolin and Flagyl.  ID recommends transitioning to oral cefadroxil plus oral  metronidazole at the time of discharge to complete total 14 days since the surgery.  Due to pain, CT abdomen and pelvis was repeated on 11/07/2020 which does not show any abscess.  She still has the same amount of pain however general surgery has seen her and according to them, she can go home.  She is not comfortable going home with this pain.  She is requesting at least 1 more day.  Tolerating diet well.  Essential hypertension: Blood pressure controlled, continue amlodipine 10 mg p.o. daily.  AKI CKD stage IIIa:  secondary to sepsis.  Creatinine improved and back to baseline.  Metabolic acidosis: Resolved.  Ringer's lactate is stopped.   GERD: Continue PPI.  Hypomagnesemia: Resolved  Hyperlipidemia: Continue statin.  History of rheumatoid arthritis: Holding home prednisone while she is n.p.o.  History of COPD: Not in acute exacerbation.  Continue as needed bronchodilators.  Continue home regimen.  DVT prophylaxis: enoxaparin (LOVENOX) injection 40 mg Start: 11/04/20 1145 SCDs Start: 11/02/20 0545   Code Status: Full Code  Family Communication: None at bedside.  Plan of care discussed with patient.  Status is: Inpatient  Remains inpatient appropriate because: Needs surgical management of perforated appendicitis.   Estimated body mass index is 23.52 kg/m as calculated from the following:   Height as of this encounter: $RemoveBeforeD'5\' 6"'xVAbLNcrdIqcSn$  (1.676 m).   Weight as of this encounter: 66.1 kg.  Nutritional Assessment: Body mass index is 23.52 kg/m.Marland Kitchen Seen by dietician.  I agree with the assessment and plan as outlined below: Nutrition Status:   Skin Assessment: I have examined the patient's skin and I agree with the wound assessment as performed by the wound care RN as outlined below:    Consultants:  General surgery ID  Procedures:  As above  Antimicrobials:  Anti-infectives (From admission, onward)    Start     Dose/Rate Route Frequency Ordered Stop   11/07/20 0600  ceFAZolin  (ANCEF) IVPB 2g/100 mL premix        2 g 200 mL/hr over 30 Minutes Intravenous Every 8 hours 11/06/20 1518 11/16/20 2359   11/05/20 1030  metroNIDAZOLE (FLAGYL) tablet 500 mg        500 mg Oral Every 12 hours 11/05/20 0931 11/16/20 2359   11/03/20 0530  cefTRIAXone (ROCEPHIN) 2 g in sodium chloride 0.9 % 100 mL IVPB  Status:  Discontinued        2 g 200 mL/hr over 30 Minutes Intravenous Every 24 hours 11/03/20 0430 11/06/20 1518   11/02/20 1600  ceFEPIme (MAXIPIME) 2 g in sodium chloride 0.9 % 100 mL IVPB  Status:  Discontinued        2 g 200 mL/hr over 30 Minutes Intravenous Every 12 hours 11/02/20 1457 11/03/20 0430   11/02/20 1000  cefTRIAXone (ROCEPHIN) 2 g in sodium chloride 0.9 % 100 mL IVPB  Status:  Discontinued        2 g 200 mL/hr over 30 Minutes Intravenous Every 24 hours 11/02/20 0616 11/02/20 1454   11/02/20 1000  metroNIDAZOLE (FLAGYL) IVPB 500 mg  Status:  Discontinued        500 mg 100 mL/hr over 60 Minutes Intravenous Every 8 hours 11/02/20 0616 11/05/20 0931   11/02/20 0200  ceFEPIme (MAXIPIME) 2 g in sodium chloride 0.9 % 100 mL IVPB       See Hyperspace for full Linked Orders Report.   2 g 200 mL/hr over 30 Minutes Intravenous  Once 11/02/20 0153 11/02/20 0323   11/02/20 0200  metroNIDAZOLE (FLAGYL) IVPB 500 mg       See Hyperspace for full Linked Orders Report.   500 mg 100 mL/hr over 60 Minutes Intravenous  Once 11/02/20 0153 11/02/20 0352          Subjective:  Patient seen and examined.  She is complaining of abdominal pain, no improved compared to yesterday.  Not feeling comfortable going home today.  Objective: Vitals:   11/07/20 2325 11/08/20 0306 11/08/20 0835 11/08/20 1125  BP: 136/77 133/82 119/75 133/83  Pulse: 98 100 94 96  Resp: 18 18 16 20   Temp: 98.5 F (36.9 C) 98.4 F (36.9 C) 98.4 F (36.9 C) 98 F (36.7 C)  TempSrc: Oral Oral Oral Oral  SpO2: 100% 90% 90% 91%  Weight:      Height:        Intake/Output Summary (Last 24 hours) at  11/08/2020 1322 Last data filed at 11/08/2020 0548 Gross per 24 hour  Intake 318.14 ml  Output 750 ml  Net -431.86 ml    Filed Weights   11/02/20 0047 11/02/20 0420 11/07/20 0350  Weight: 66.2 kg 65.4 kg 66.1 kg    Examination:  General exam: Appears calm and comfortable  Respiratory system: Clear to auscultation. Respiratory effort normal. Cardiovascular system: S1 & S2 heard, RRR. No JVD, murmurs, rubs, gallops or clicks. No pedal edema. Gastrointestinal system: Abdomen is nondistended, soft and moderate right lower quadrant tenderness. No organomegaly or masses felt. Normal bowel sounds heard. Central nervous system: Alert and oriented. No focal neurological deficits. Extremities: Symmetric 5 x 5 power. Skin: No rashes, lesions or ulcers.  Psychiatry: Judgement and insight appear normal. Mood & affect appropriate.    Data Reviewed: I have personally reviewed following labs and imaging studies  CBC: Recent  Labs  Lab 11/04/20 0131 11/05/20 0143 11/06/20 0125 11/07/20 0202 11/08/20 0030  WBC 10.2 11.3* 10.0 8.7 8.6  NEUTROABS 8.7* 9.5* 7.3 5.0 5.2  HGB 11.4* 11.3* 13.5 11.3* 11.5*  HCT 34.1* 34.0* 40.0 33.3* 34.8*  MCV 88.3 87.2 87.9 87.2 88.3  PLT 230 253 268 277 509    Basic Metabolic Panel: Recent Labs  Lab 11/03/20 0602 11/04/20 0131 11/05/20 0143 11/06/20 0125 11/08/20 0030  NA 136 135 133* 133* 130*  K 4.5 4.8 4.3 4.7 4.3  CL 106 110 106 103 97*  CO2 17* 19* 19* 21* 27  GLUCOSE 90 90 116* 78 106*  BUN 30* 28* 17 10 6*  CREATININE 1.98* 1.42* 0.95 0.95 0.86  CALCIUM 8.4* 8.1* 8.1* 8.4* 8.1*  MG 1.3* 1.6* 1.9 1.6*  --     GFR: Estimated Creatinine Clearance: 60.2 mL/min (by C-G formula based on SCr of 0.86 mg/dL). Liver Function Tests: Recent Labs  Lab 11/02/20 0117 11/03/20 0602 11/04/20 0131 11/05/20 0143  AST 11* 36 24 21  ALT 6 21 15 12   ALKPHOS 103 74 72 82  BILITOT 0.6 0.7 0.7 0.6  PROT 7.6 6.0* 5.4* 5.4*  ALBUMIN 3.8 2.3* 2.0*  1.9*    Recent Labs  Lab 11/04/20 0131  LIPASE 21    No results for input(s): AMMONIA in the last 168 hours. Coagulation Profile: Recent Labs  Lab 11/02/20 0653  INR 1.1    Cardiac Enzymes: No results for input(s): CKTOTAL, CKMB, CKMBINDEX, TROPONINI in the last 168 hours. BNP (last 3 results) No results for input(s): PROBNP in the last 8760 hours. HbA1C: No results for input(s): HGBA1C in the last 72 hours. CBG: No results for input(s): GLUCAP in the last 168 hours. Lipid Profile: No results for input(s): CHOL, HDL, LDLCALC, TRIG, CHOLHDL, LDLDIRECT in the last 72 hours. Thyroid Function Tests: No results for input(s): TSH, T4TOTAL, FREET4, T3FREE, THYROIDAB in the last 72 hours. Anemia Panel: No results for input(s): VITAMINB12, FOLATE, FERRITIN, TIBC, IRON, RETICCTPCT in the last 72 hours. Sepsis Labs: No results for input(s): PROCALCITON, LATICACIDVEN in the last 168 hours.  Recent Results (from the past 240 hour(s))  Resp Panel by RT-PCR (Flu A&B, Covid) Nasopharyngeal Swab     Status: None   Collection Time: 11/02/20  1:46 AM   Specimen: Nasopharyngeal Swab; Nasopharyngeal(NP) swabs in vial transport medium  Result Value Ref Range Status   SARS Coronavirus 2 by RT PCR NEGATIVE NEGATIVE Final    Comment: (NOTE) SARS-CoV-2 target nucleic acids are NOT DETECTED.  The SARS-CoV-2 RNA is generally detectable in upper respiratory specimens during the acute phase of infection. The lowest concentration of SARS-CoV-2 viral copies this assay can detect is 138 copies/mL. A negative result does not preclude SARS-Cov-2 infection and should not be used as the sole basis for treatment or other patient management decisions. A negative result may occur with  improper specimen collection/handling, submission of specimen other than nasopharyngeal swab, presence of viral mutation(s) within the areas targeted by this assay, and inadequate number of viral copies(<138 copies/mL). A  negative result must be combined with clinical observations, patient history, and epidemiological information. The expected result is Negative.  Fact Sheet for Patients:  EntrepreneurPulse.com.au  Fact Sheet for Healthcare Providers:  IncredibleEmployment.be  This test is no t yet approved or cleared by the [place] FDA and  has been authorized for detection and/or diagnosis of SARS-CoV-2 by FDA under an Emergency Use Authorization (EUA). This EUA will remain  in effect (meaning this test can be used) for the duration of the COVID-19 declaration under Section 564(b)(1) of the Act, 21 U.S.C.section 360bbb-3(b)(1), unless the authorization is terminated  or revoked sooner.       Influenza A by PCR NEGATIVE NEGATIVE Final   Influenza B by PCR NEGATIVE NEGATIVE Final    Comment: (NOTE) The Xpert Xpress SARS-CoV-2/FLU/RSV plus assay is intended as an aid in the diagnosis of influenza from Nasopharyngeal swab specimens and should not be used as a sole basis for treatment. Nasal washings and aspirates are unacceptable for Xpert Xpress SARS-CoV-2/FLU/RSV testing.  Fact Sheet for Patients: EntrepreneurPulse.com.au  Fact Sheet for Healthcare Providers: IncredibleEmployment.be  This test is not yet approved or cleared by the [place] FDA and has been authorized for detection and/or diagnosis of SARS-CoV-2 by FDA under an Emergency Use Authorization (EUA). This EUA will remain in effect (meaning this test can be used) for the duration of the COVID-19 declaration under Section 564(b)(1) of the Act, 21 U.S.C. section 360bbb-3(b)(1), unless the authorization is terminated or revoked.  Performed at [facility], [address], [place], [place] [address]   Blood culture (routine x 2)     Status: Abnormal   Collection Time: 11/02/20  2:00 AM   Specimen: BLOOD  Result Value Ref Range  Status   Specimen Description   Final    BLOOD Blood Culture adequate volume Performed at [facility], [address] [place], [place] [address]    Special Requests   Final    BLOOD LEFT ARM Performed at [facility], [address], [place], [place] [address]    Culture  Setup Time   Final    GRAM NEGATIVE RODS IN BOTH AEROBIC AND ANAEROBIC BOTTLES CRITICAL RESULT CALLED TO, READ BACK BY AND VERIFIED WITH: PHARMD JAMES LEDFORD 11/03/20@4 :18 BY TW    Culture (A)  Final    ESCHERICHIA COLI BACTEROIDES FRAGILIS BETA LACTAMASE POSITIVE Performed at [facility], [place] [address] [place], [place] [address]    Report Status 11/06/2020 FINAL  Final   Organism ID, Bacteria ESCHERICHIA COLI  Final      Susceptibility   Escherichia coli - MIC*    AMPICILLIN >=32 RESISTANT Resistant     CEFAZOLIN <=4 SENSITIVE Sensitive     CEFEPIME <=0.12 SENSITIVE Sensitive     CEFTAZIDIME <=1 SENSITIVE Sensitive     CEFTRIAXONE <=0.25 SENSITIVE Sensitive     CIPROFLOXACIN <=0.25 SENSITIVE Sensitive     GENTAMICIN <=1 SENSITIVE Sensitive     IMIPENEM <=0.25 SENSITIVE Sensitive     TRIMETH/SULFA >=320 RESISTANT Resistant     AMPICILLIN/SULBACTAM >=32 RESISTANT Resistant     PIP/TAZO <=4 SENSITIVE Sensitive     * ESCHERICHIA COLI  Blood Culture ID Panel (Reflexed)     Status: Abnormal   Collection Time: 11/02/20  2:00 AM  Result Value Ref Range Status   Enterococcus faecalis NOT DETECTED NOT DETECTED Final   Enterococcus Faecium NOT DETECTED NOT DETECTED Final   Listeria monocytogenes NOT DETECTED NOT DETECTED Final   Staphylococcus species NOT DETECTED NOT DETECTED Final   Staphylococcus aureus (BCID) NOT DETECTED NOT DETECTED Final   Staphylococcus epidermidis NOT DETECTED NOT DETECTED Final   Staphylococcus lugdunensis NOT DETECTED NOT DETECTED Final   Streptococcus species NOT DETECTED NOT DETECTED Final   Streptococcus agalactiae NOT DETECTED  NOT DETECTED Final   Streptococcus pneumoniae NOT DETECTED NOT DETECTED Final   Streptococcus pyogenes NOT DETECTED NOT DETECTED Final  A.calcoaceticus-baumannii NOT DETECTED NOT DETECTED Final   Bacteroides fragilis NOT DETECTED NOT DETECTED Final   Enterobacterales DETECTED (A) NOT DETECTED Final    Comment: Enterobacterales represent a large order of gram negative bacteria, not a single organism. CRITICAL RESULT CALLED TO, READ BACK BY AND VERIFIED WITH: PHARMD JAMES LEDFORD 11/03/20@4 :18 BY TW    Enterobacter cloacae complex NOT DETECTED NOT DETECTED Final   Escherichia coli DETECTED (A) NOT DETECTED Final    Comment: CRITICAL RESULT CALLED TO, READ BACK BY AND VERIFIED WITH: PHARMD JAMES LEDFORD 11/03/20@4 :18 BY TW    Klebsiella aerogenes NOT DETECTED NOT DETECTED Final   Klebsiella oxytoca NOT DETECTED NOT DETECTED Final   Klebsiella pneumoniae NOT DETECTED NOT DETECTED Final   Proteus species NOT DETECTED NOT DETECTED Final   Salmonella species NOT DETECTED NOT DETECTED Final   Serratia marcescens NOT DETECTED NOT DETECTED Final   Haemophilus influenzae NOT DETECTED NOT DETECTED Final   Neisseria meningitidis NOT DETECTED NOT DETECTED Final   Pseudomonas aeruginosa NOT DETECTED NOT DETECTED Final   Stenotrophomonas maltophilia NOT DETECTED NOT DETECTED Final   Candida albicans NOT DETECTED NOT DETECTED Final   Candida auris NOT DETECTED NOT DETECTED Final   Candida glabrata NOT DETECTED NOT DETECTED Final   Candida krusei NOT DETECTED NOT DETECTED Final   Candida parapsilosis NOT DETECTED NOT DETECTED Final   Candida tropicalis NOT DETECTED NOT DETECTED Final   Cryptococcus neoformans/gattii NOT DETECTED NOT DETECTED Final   CTX-M ESBL NOT DETECTED NOT DETECTED Final   Carbapenem resistance IMP NOT DETECTED NOT DETECTED Final   Carbapenem resistance KPC NOT DETECTED NOT DETECTED Final   Carbapenem resistance NDM NOT DETECTED NOT DETECTED Final   Carbapenem resist OXA 48  LIKE NOT DETECTED NOT DETECTED Final   Carbapenem resistance VIM NOT DETECTED NOT DETECTED Final    Comment: Performed at [facility], [place] [address], [place], [place] [address]  Blood culture (routine x 2)     Status: None   Collection Time: 11/02/20  2:52 AM   Specimen: BLOOD  Result Value Ref Range Status   Specimen Description   Final    BLOOD Blood Culture results may not be optimal due to an inadequate volume of blood received in culture bottles Performed at [facility], [address], [place], [place] [address]    Special Requests   Final    RIGHT ANTECUBITAL Performed at [facility], [address] [place], [place] [address]    Culture   Final    NO GROWTH 5 DAYS Performed at [facility], [address] [place], [place] [address]    Report Status 11/07/2020 FINAL  Final       Radiology Studies: CT CHEST ABDOMEN PELVIS WO CONTRAST  Result Date: 11/07/2020 CLINICAL DATA:  Abdominal pain. EXAM: CT CHEST, ABDOMEN AND PELVIS WITHOUT CONTRAST TECHNIQUE: Multidetector CT imaging of the chest, abdomen and pelvis was performed following the standard protocol without IV contrast. COMPARISON:  CT abdomen pelvis dated November 02, 2020. Chest x-ray dated June 12, 2020. CT chest dated January 06, 2017. FINDINGS: CT CHEST FINDINGS Cardiovascular: Normal heart size. New trace pericardial effusion. No thoracic aortic aneurysm. Coronary, aortic arch, and branch vessel atherosclerotic vascular disease. Mediastinum/Nodes: No enlarged mediastinal, hilar, or axillary lymph nodes. Thyroid gland, trachea, and esophagus demonstrate no significant findings. Large hiatal hernia again noted. Lungs/Pleura: Increased moderate right and small left pleural effusions. Complete collapse of the right lower lobe. Dependent atelectasis in the  left lower lobe, right upper lobe, and right middle lobe. Mild centrilobular emphysema. Scattered smooth mild  interlobular septal thickening. No pneumothorax. Musculoskeletal: No acute or significant osseous findings. Old left-sided rib fractures. CT ABDOMEN PELVIS FINDINGS Hepatobiliary: Unchanged small cyst in the peripheral left hepatic lobe. No new focal liver abnormality. Normal gallbladder. No biliary dilatation. Pancreas: Similar atrophy without ductal dilatation or surrounding inflammatory change. Spleen: Normal in size without focal abnormality. Adrenals/Urinary Tract: Adrenal glands are unremarkable. Unchanged small left renal simple cyst. No renal calculi or hydronephrosis. The bladder is unremarkable. Stomach/Bowel: Unchanged large hiatal hernia. No bowel wall thickening, distention, or surrounding inflammatory changes. Oral contrast in the colon. Interval appendectomy. Vascular/Lymphatic: Aortic atherosclerosis. No enlarged abdominal or pelvic lymph nodes. Reproductive: Status post hysterectomy. No adnexal masses. Other: New surgical drain in the right abdomen. Resolved pneumoperitoneum. Ill-defined fluid and phlegmonous change in the right abdomen measuring 1.7 x 2.6 x 3.4 cm (series 3, image 83; series 6, image 74) without discrete drainable fluid collection. Musculoskeletal: No acute or significant osseous findings. IMPRESSION: 1. Interval appendectomy with ill-defined fluid and phlegmonous change in the right abdomen without discrete drainable abscess. 2. Increased moderate right and small left pleural effusions with complete collapse of the right lower lobe. 3. Aortic Atherosclerosis (ICD10-I70.0) and Emphysema (ICD10-J43.9). Electronically Signed   By: Titus Dubin M.D.   On: 11/07/2020 13:12    Scheduled Meds:  amLODipine  10 mg Oral q morning   atorvastatin  40 mg Oral q1800   bisacodyl  10 mg Rectal Once   docusate sodium  100 mg Oral BID   enoxaparin (LOVENOX) injection  40 mg Subcutaneous Daily   feeding supplement  237 mL Oral BID BM   fluticasone  2 spray Each Nare Daily   fluticasone  furoate-vilanterol  1 puff Inhalation Daily   gabapentin  100 mg Oral BID   metroNIDAZOLE  500 mg Oral Q12H   pantoprazole (PROTONIX) IV  40 mg Intravenous Q12H   Continuous Infusions:   ceFAZolin (ANCEF) IV 2 g (11/08/20 1322)   methocarbamol (ROBAXIN) IV       LOS: 6 days   Time spent: 27 minutes   Darliss Cheney, MD [facility]  11/08/2020, 1:22 PM  Please page via [place] and do not message via secure chat for anything urgent. Secure chat can be used for anything non urgent.  How to contact the [facility] Attending or Consulting provider [place] or covering provider during after hours [place], for this patient?  Check the care team in [facility] and look for a) attending/consulting [facility] provider listed and b) the [facility] team listed. Page or secure chat [phone]. Log into [email or web address] and use [place]'s universal password to access. If you do not have the password, please contact the hospital operator. Locate the [facility] provider you are looking for under [facility] and page to a number that you can be directly reached. If you still have difficulty reaching the provider, please page the [facility] (Director on Call) for the Hospitalists listed on amion for assistance.

## 2020-11-08 NOTE — Progress Notes (Signed)
Progress Note  5 Days Post-Op  Subjective: Tolerating food, having bowel movements, pain controlled.   Objective: Vital signs in last 24 hours: Temp:  [98.1 F (36.7 C)-99 F (37.2 C)] 98.4 F (36.9 C) (10/29 0835) Pulse Rate:  [94-105] 94 (10/29 0835) Resp:  [16-20] 16 (10/29 0835) BP: (119-140)/(74-82) 119/75 (10/29 0835) SpO2:  [90 %-100 %] 90 % (10/29 0835) Last BM Date: 11/06/20  Intake/Output from previous day: 10/28 0701 - 10/29 0700 In: 318.1 [IV Piggyback:318.1] Out: 750 [Urine:750] Intake/Output this shift: No intake/output data recorded.  PE: Gen:  Alert, NAD, pleasant Pulm:  rate and effort normal on Ross Abd: Soft, mild distension, ttp along RLQ and RUQ, incisions C/D/I, drain with serous drainage   Lab Results:  Recent Labs    11/07/20 0202 11/08/20 0030  WBC 8.7 8.6  HGB 11.3* 11.5*  HCT 33.3* 34.8*  PLT 277 332    BMET Recent Labs    11/06/20 0125 11/08/20 0030  NA 133* 130*  K 4.7 4.3  CL 103 97*  CO2 21* 27  GLUCOSE 78 106*  BUN 10 6*  CREATININE 0.95 0.86  CALCIUM 8.4* 8.1*    PT/INR No results for input(s): LABPROT, INR in the last 72 hours. CMP     Component Value Date/Time   NA 130 (L) 11/08/2020 0030   NA 141 06/06/2015 0000   K 4.3 11/08/2020 0030   K 4.7 06/13/2015 0000   CL 97 (L) 11/08/2020 0030   CO2 27 11/08/2020 0030   GLUCOSE 106 (H) 11/08/2020 0030   BUN 6 (L) 11/08/2020 0030   BUN 8 06/06/2015 0000   CREATININE 0.86 11/08/2020 0030   CALCIUM 8.1 (L) 11/08/2020 0030   PROT 5.4 (L) 11/05/2020 0143   ALBUMIN 1.9 (L) 11/05/2020 0143   AST 21 11/05/2020 0143   ALT 12 11/05/2020 0143   ALKPHOS 82 11/05/2020 0143   BILITOT 0.6 11/05/2020 0143   GFRNONAA >60 11/08/2020 0030   GFRAA >60 06/28/2018 0535   Lipase     Component Value Date/Time   LIPASE 21 11/04/2020 0131       Studies/Results: CT CHEST ABDOMEN PELVIS WO CONTRAST  Result Date: 11/07/2020 CLINICAL DATA:  Abdominal pain. EXAM: CT CHEST,  ABDOMEN AND PELVIS WITHOUT CONTRAST TECHNIQUE: Multidetector CT imaging of the chest, abdomen and pelvis was performed following the standard protocol without IV contrast. COMPARISON:  CT abdomen pelvis dated November 02, 2020. Chest x-ray dated June 12, 2020. CT chest dated January 06, 2017. FINDINGS: CT CHEST FINDINGS Cardiovascular: Normal heart size. New trace pericardial effusion. No thoracic aortic aneurysm. Coronary, aortic arch, and branch vessel atherosclerotic vascular disease. Mediastinum/Nodes: No enlarged mediastinal, hilar, or axillary lymph nodes. Thyroid gland, trachea, and esophagus demonstrate no significant findings. Large hiatal hernia again noted. Lungs/Pleura: Increased moderate right and small left pleural effusions. Complete collapse of the right lower lobe. Dependent atelectasis in the left lower lobe, right upper lobe, and right middle lobe. Mild centrilobular emphysema. Scattered smooth mild interlobular septal thickening. No pneumothorax. Musculoskeletal: No acute or significant osseous findings. Old left-sided rib fractures. CT ABDOMEN PELVIS FINDINGS Hepatobiliary: Unchanged small cyst in the peripheral left hepatic lobe. No new focal liver abnormality. Normal gallbladder. No biliary dilatation. Pancreas: Similar atrophy without ductal dilatation or surrounding inflammatory change. Spleen: Normal in size without focal abnormality. Adrenals/Urinary Tract: Adrenal glands are unremarkable. Unchanged small left renal simple cyst. No renal calculi or hydronephrosis. The bladder is unremarkable. Stomach/Bowel: Unchanged large hiatal hernia. No bowel  wall thickening, distention, or surrounding inflammatory changes. Oral contrast in the colon. Interval appendectomy. Vascular/Lymphatic: Aortic atherosclerosis. No enlarged abdominal or pelvic lymph nodes. Reproductive: Status post hysterectomy. No adnexal masses. Other: New surgical drain in the right abdomen. Resolved pneumoperitoneum.  Ill-defined fluid and phlegmonous change in the right abdomen measuring 1.7 x 2.6 x 3.4 cm (series 3, image 83; series 6, image 74) without discrete drainable fluid collection. Musculoskeletal: No acute or significant osseous findings. IMPRESSION: 1. Interval appendectomy with ill-defined fluid and phlegmonous change in the right abdomen without discrete drainable abscess. 2. Increased moderate right and small left pleural effusions with complete collapse of the right lower lobe. 3. Aortic Atherosclerosis (ICD10-I70.0) and Emphysema (ICD10-J43.9). Electronically Signed   By: Titus Dubin M.D.   On: 11/07/2020 13:12    Anti-infectives: Anti-infectives (From admission, onward)    Start     Dose/Rate Route Frequency Ordered Stop   11/07/20 0600  ceFAZolin (ANCEF) IVPB 2g/100 mL premix        2 g 200 mL/hr over 30 Minutes Intravenous Every 8 hours 11/06/20 1518 11/16/20 2359   11/05/20 1030  metroNIDAZOLE (FLAGYL) tablet 500 mg        500 mg Oral Every 12 hours 11/05/20 0931 11/16/20 2359   11/03/20 0530  cefTRIAXone (ROCEPHIN) 2 g in sodium chloride 0.9 % 100 mL IVPB  Status:  Discontinued        2 g 200 mL/hr over 30 Minutes Intravenous Every 24 hours 11/03/20 0430 11/06/20 1518   11/02/20 1600  ceFEPIme (MAXIPIME) 2 g in sodium chloride 0.9 % 100 mL IVPB  Status:  Discontinued        2 g 200 mL/hr over 30 Minutes Intravenous Every 12 hours 11/02/20 1457 11/03/20 0430   11/02/20 1000  cefTRIAXone (ROCEPHIN) 2 g in sodium chloride 0.9 % 100 mL IVPB  Status:  Discontinued        2 g 200 mL/hr over 30 Minutes Intravenous Every 24 hours 11/02/20 0616 11/02/20 1454   11/02/20 1000  metroNIDAZOLE (FLAGYL) IVPB 500 mg  Status:  Discontinued        500 mg 100 mL/hr over 60 Minutes Intravenous Every 8 hours 11/02/20 0616 11/05/20 0931   11/02/20 0200  ceFEPIme (MAXIPIME) 2 g in sodium chloride 0.9 % 100 mL IVPB       See Hyperspace for full Linked Orders Report.   2 g 200 mL/hr over 30 Minutes  Intravenous  Once 11/02/20 0153 11/02/20 0323   11/02/20 0200  metroNIDAZOLE (FLAGYL) IVPB 500 mg       See Hyperspace for full Linked Orders Report.   500 mg 100 mL/hr over 60 Minutes Intravenous  Once 11/02/20 0153 11/02/20 0352        Assessment/Plan Perforated appendicitis with feculent and purulent peritonitis  POD#4 s/p Laparoscopic lysis of adhesions x30 minutes, laparoscopic appendectomy, washout and drain placement 10/24 Dr. Kae Heller - continue antibiotics, needs at least 1 week abx postop and could be switched to oral at discharge. She does have an E coli bacteremia - abx per primary for this - continue JP drain and monitor output - currently serous, keep JP drain in place at discharge - tolerating diet ok but still with a lot of abdominal ttp - CT AP yesterday without undrained fluid collections - mobilize, continue PT - no follow up recommended  Ready for discharge from general surgery standpoint   ID - rocephin/flagyl 10/23>>10/30. WBC normalized 8 from 11.3, afebrile FEN - Regular diet  VTE - SCDs, lovenox Foley - none   E colic bacteremia COPD Hypoxia - weaning supplemental O2 Rheumatoid arthritis  HTN HLD GERD AKI on CKD - improved Anemia - Hgb 11.5, stable Tobacco abuse Cocaine use  LOS: 6 days    Felicie Morn, MD [facility] 11/08/2020, 8:50 AM Please see Amion for pager number during day hours 7:00am-4:30pm

## 2020-11-09 DIAGNOSIS — K3532 Acute appendicitis with perforation and localized peritonitis, without abscess: Secondary | ICD-10-CM | POA: Diagnosis not present

## 2020-11-09 LAB — BASIC METABOLIC PANEL
Anion gap: 10 (ref 5–15)
BUN: <5 mg/dL — ABNORMAL LOW (ref 8–23)
CO2: 22 mmol/L (ref 22–32)
Calcium: 8.3 mg/dL — ABNORMAL LOW (ref 8.9–10.3)
Chloride: 99 mmol/L (ref 98–111)
Creatinine, Ser: 0.81 mg/dL (ref 0.44–1.00)
GFR, Estimated: >60 mL/min (ref >60)
Glucose, Bld: 92 mg/dL (ref 70–99)
Potassium: 4.5 mmol/L (ref 3.5–5.1)
Sodium: 131 mmol/L — ABNORMAL LOW (ref 135–145)

## 2020-11-09 NOTE — Progress Notes (Signed)
PROGRESS NOTE    [name]  MRN:[id number] DOB: 05/01/1955 DOA: 11/02/2020 PCP: Avbuere, Edwin, MD   Brief Narrative:  65-year-old female with past medical history of polysubstance abuse (cocaine, opiates), hypertension, chronic kidney disease stage IIIa, cocaine induced ischemic colitis (2018, 2020), spinal stenosis, rheumatoid arthritis, gastroesophageal reflux disease who presented [facility] department with complaints of abdominal pain.   Upon evaluation in the emergency department CT imaging of the abdomen pelvis was performed revealing foci of free intra-abdominal air concerning for perforated viscus.  Radiology felt the source may be perforated appendicitis with a large dilated tubular structure in the right lower quadrant that may represent an inflamed appendix.  Adjacent ileus was also identified.  ER provider discussed case with Dr. Lovick with general surgery who agreed to evaluate the patient in consultation upon arrival to [facility].  The hospitalist group was then called and the patient was accepted for transfer to [facility] for continued medical care".  Assessment & Plan:   Principal Problem:   Perforated appendicitis Active Problems:   Rheumatoid arthritis (HCC)   Essential hypertension   Mixed hyperlipidemia   COPD (chronic obstructive pulmonary disease) (HCC)   GERD without esophagitis   Chronic kidney disease, stage 3a (HCC)  Sepsis secondary to perforated appendicitis/E. coli bacteremia, sepsis not present on admission: Perforated appendicitis confirmed on the CT scan.  Patient met sepsis criteria based on tachycardia, tachypnea and leukocytosis.  Sepsis physiology improved.  Seen by general surgery on the morning of 11/03/2020 and taken to the OR emergently for surgical exploration.  Blood culture growing E. coli.  Antibiotics switched to cefazolin and Flagyl.  ID recommends transitioning to oral cefadroxil plus oral  metronidazole at the time of discharge to complete total 14 days since the surgery.  Due to pain, CT abdomen and pelvis was repeated on 11/07/2020 which does not show any abscess.  She still has the same amount of pain however general surgery has seen her and according to them, she can go home.  She is crying in pain this morning.  She was telling me that she lives by himself and she is not sure if she can take care of herself.  I spoke to her daughter, her daughter lives in [place] and she lives in [place].  Her daughter was under the impression that patient will be going to rehab and thus she did not make any arrangements for patient to come home.  Her daughter told me that she is going to go check her place to see if it is ready for her to come back but she will not be able to do that very late in the evening and thus she requested to keep her in the hospital and discharge her tomorrow.  Plan to discharge her tomorrow.  Essential hypertension: Blood pressure controlled, continue amlodipine 10 mg p.o. daily.  AKI CKD stage IIIa:  secondary to sepsis.  Creatinine improved and back to baseline.  Metabolic acidosis: Resolved.   GERD: Continue PPI.  Hypomagnesemia: Resolved  Hyperlipidemia: Continue statin.  History of rheumatoid arthritis: Holding prednisone.  Will resume at discharge.  History of COPD: Not in acute exacerbation.  Continue as needed bronchodilators.  Continue home regimen.  DVT prophylaxis: enoxaparin (LOVENOX) injection 40 mg Start: 11/04/20 1145 SCDs Start: 11/02/20 0545   Code Status: Full Code  Family Communication: None at bedside.  Plan of care discussed with patient and her  Daughter over the phone.  Status is: Inpatient    Remains inpatient appropriate because: Needs surgical management of perforated appendicitis.   Estimated body mass index is 23.52 kg/m as calculated from the following:   Height as of this encounter: 5' 6" (1.676 m).   Weight as of this  encounter: 66.1 kg.  Nutritional Assessment: Body mass index is 23.52 kg/m.Marland Kitchen Seen by dietician.  I agree with the assessment and plan as outlined below: Nutrition Status:   Skin Assessment: I have examined the patient's skin and I agree with the wound assessment as performed by the wound care RN as outlined below:    Consultants:  General surgery ID  Procedures:  As above  Antimicrobials:  Anti-infectives (From admission, onward)    Start     Dose/Rate Route Frequency Ordered Stop   11/07/20 0600  ceFAZolin (ANCEF) IVPB 2g/100 mL premix        2 g 200 mL/hr over 30 Minutes Intravenous Every 8 hours 11/06/20 1518 11/16/20 2359   11/05/20 1030  metroNIDAZOLE (FLAGYL) tablet 500 mg        500 mg Oral Every 12 hours 11/05/20 0931 11/16/20 2359   11/03/20 0530  cefTRIAXone (ROCEPHIN) 2 g in sodium chloride 0.9 % 100 mL IVPB  Status:  Discontinued        2 g 200 mL/hr over 30 Minutes Intravenous Every 24 hours 11/03/20 0430 11/06/20 1518   11/02/20 1600  ceFEPIme (MAXIPIME) 2 g in sodium chloride 0.9 % 100 mL IVPB  Status:  Discontinued        2 g 200 mL/hr over 30 Minutes Intravenous Every 12 hours 11/02/20 1457 11/03/20 0430   11/02/20 1000  cefTRIAXone (ROCEPHIN) 2 g in sodium chloride 0.9 % 100 mL IVPB  Status:  Discontinued        2 g 200 mL/hr over 30 Minutes Intravenous Every 24 hours 11/02/20 0616 11/02/20 1454   11/02/20 1000  metroNIDAZOLE (FLAGYL) IVPB 500 mg  Status:  Discontinued        500 mg 100 mL/hr over 60 Minutes Intravenous Every 8 hours 11/02/20 0616 11/05/20 0931   11/02/20 0200  ceFEPIme (MAXIPIME) 2 g in sodium chloride 0.9 % 100 mL IVPB       See Hyperspace for full Linked Orders Report.   2 g 200 mL/hr over 30 Minutes Intravenous  Once 11/02/20 0153 11/02/20 0323   11/02/20 0200  metroNIDAZOLE (FLAGYL) IVPB 500 mg       See Hyperspace for full Linked Orders Report.   500 mg 100 mL/hr over 60 Minutes Intravenous  Once 11/02/20 0153 11/02/20 0352           Subjective:  Patient seen and examined.  She is complaining of pain, states that it got worse overnight and she is in tears due to pain.  She does not feel comfortable going home today as she lives by herself.  Objective: Vitals:   11/08/20 2038 11/09/20 0306 11/09/20 0732 11/09/20 0800  BP: 134/75 (!) 142/66 (!) 145/75   Pulse: 100 98 98   Resp: _0 Temp: 98.6 F (37 C) 98.5 F (36.9 C) 98.2 F (36.8 C)   TempSrc: Oral Oral Oral   SpO2: 93% (!) 82% 90% 90%  Weight:      Height:        Intake/Output Summary (Last 24 hours) at 11/09/2020 1053 Last data filed at 11/08/2020 1834 Gross per 24 hour  Intake 284.43 ml  Output 20 ml  Net 264.43 ml    Danley Danker  Weights   11/02/20 0047 11/02/20 0420 11/07/20 0350  Weight: 66.2 kg 65.4 kg 66.1 kg    Examination:  General exam: Appears calm and comfortable  Respiratory system: Clear to auscultation. Respiratory effort normal. Cardiovascular system: S1 & S2 heard, RRR. No JVD, murmurs, rubs, gallops or clicks. No pedal edema. Gastrointestinal system: Abdomen is nondistended, soft and moderate to severe right lower quadrant tenderness, periumbilical tenderness. No organomegaly or masses felt. Normal bowel sounds heard. Central nervous system: Alert and oriented. No focal neurological deficits. Extremities: Symmetric 5 x 5 power. Skin: No rashes, lesions or ulcers.  Psychiatry: Judgement and insight appear normal. Mood & affect appropriate.    Data Reviewed: I have personally reviewed following labs and imaging studies  CBC: Recent Labs  Lab 11/04/20 0131 11/05/20 0143 11/06/20 0125 11/07/20 0202 11/08/20 0030  WBC 10.2 11.3* 10.0 8.7 8.6  NEUTROABS 8.7* 9.5* 7.3 5.0 5.2  HGB 11.4* 11.3* 13.5 11.3* 11.5*  HCT 34.1* 34.0* 40.0 33.3* 34.8*  MCV 88.3 87.2 87.9 87.2 88.3  PLT 230 253 268 277 332    Basic Metabolic Panel: Recent Labs  Lab 11/03/20 0602 11/04/20 0131 11/05/20 0143 11/06/20 0125  11/08/20 0030 11/09/20 0425  NA 136 135 133* 133* 130* 131*  K 4.5 4.8 4.3 4.7 4.3 4.5  CL 106 110 106 103 97* 99  CO2 17* 19* 19* 21* 27 22  GLUCOSE 90 90 116* 78 106* 92  BUN 30* 28* 17 10 6* <5*  CREATININE 1.98* 1.42* 0.95 0.95 0.86 0.81  CALCIUM 8.4* 8.1* 8.1* 8.4* 8.1* 8.3*  MG 1.3* 1.6* 1.9 1.6*  --   --     GFR: Estimated Creatinine Clearance: 64 mL/min (by C-G formula based on SCr of 0.81 mg/dL). Liver Function Tests: Recent Labs  Lab 11/03/20 0602 11/04/20 0131 11/05/20 0143  AST 36 24 21  ALT 21 15 12  ALKPHOS 74 72 82  BILITOT 0.7 0.7 0.6  PROT 6.0* 5.4* 5.4*  ALBUMIN 2.3* 2.0* 1.9*    Recent Labs  Lab 11/04/20 0131  LIPASE 21    No results for input(s): AMMONIA in the last 168 hours. Coagulation Profile: No results for input(s): INR, PROTIME in the last 168 hours.  Cardiac Enzymes: No results for input(s): CKTOTAL, CKMB, CKMBINDEX, TROPONINI in the last 168 hours. BNP (last 3 results) No results for input(s): PROBNP in the last 8760 hours. HbA1C: No results for input(s): HGBA1C in the last 72 hours. CBG: No results for input(s): GLUCAP in the last 168 hours. Lipid Profile: No results for input(s): CHOL, HDL, LDLCALC, TRIG, CHOLHDL, LDLDIRECT in the last 72 hours. Thyroid Function Tests: No results for input(s): TSH, T4TOTAL, FREET4, T3FREE, THYROIDAB in the last 72 hours. Anemia Panel: No results for input(s): VITAMINB12, FOLATE, FERRITIN, TIBC, IRON, RETICCTPCT in the last 72 hours. Sepsis Labs: No results for input(s): PROCALCITON, LATICACIDVEN in the last 168 hours.  Recent Results (from the past 240 hour(s))  Resp Panel by RT-PCR (Flu A&B, Covid) Nasopharyngeal Swab     Status: None   Collection Time: 11/02/20  1:46 AM   Specimen: Nasopharyngeal Swab; Nasopharyngeal(NP) swabs in vial transport medium  Result Value Ref Range Status   SARS Coronavirus 2 by RT PCR NEGATIVE NEGATIVE Final    Comment: (NOTE) SARS-CoV-2 target nucleic acids  are NOT DETECTED.  The SARS-CoV-2 RNA is generally detectable in upper respiratory specimens during the acute phase of infection. The lowest concentration of SARS-CoV-2 viral copies this assay can detect is   138 copies/mL. A negative result does not preclude SARS-Cov-2 infection and should not be used as the sole basis for treatment or other patient management decisions. A negative result may occur with  improper specimen collection/handling, submission of specimen other than nasopharyngeal swab, presence of viral mutation(s) within the areas targeted by this assay, and inadequate number of viral copies(<138 copies/mL). A negative result must be combined with clinical observations, patient history, and epidemiological information. The expected result is Negative.  Fact Sheet for Patients:  https://www.fda.gov/media/152166/download  Fact Sheet for Healthcare Providers:  https://www.fda.gov/media/152162/download  This test is no t yet approved or cleared by the [place] FDA and  has been authorized for detection and/or diagnosis of SARS-CoV-2 by FDA under an Emergency Use Authorization (EUA). This EUA will remain  in effect (meaning this test can be used) for the duration of the COVID-19 declaration under Section 564(b)(1) of the Act, 21 U.S.C.section 360bbb-3(b)(1), unless the authorization is terminated  or revoked sooner.       Influenza A by PCR NEGATIVE NEGATIVE Final   Influenza B by PCR NEGATIVE NEGATIVE Final    Comment: (NOTE) The Xpert Xpress SARS-CoV-2/FLU/RSV plus assay is intended as an aid in the diagnosis of influenza from Nasopharyngeal swab specimens and should not be used as a sole basis for treatment. Nasal washings and aspirates are unacceptable for Xpert Xpress SARS-CoV-2/FLU/RSV testing.  Fact Sheet for Patients: https://www.fda.gov/media/152166/download  Fact Sheet for Healthcare Providers: https://www.fda.gov/media/152162/download  This test is  not yet approved or cleared by the [place] FDA and has been authorized for detection and/or diagnosis of SARS-CoV-2 by FDA under an Emergency Use Authorization (EUA). This EUA will remain in effect (meaning this test can be used) for the duration of the COVID-19 declaration under Section 564(b)(1) of the Act, 21 U.S.C. section 360bbb-3(b)(1), unless the authorization is terminated or revoked.  Performed at [facility], [address], [place], [place] [address]   Blood culture (routine x 2)     Status: Abnormal   Collection Time: 11/02/20  2:00 AM   Specimen: BLOOD  Result Value Ref Range Status   Specimen Description   Final    BLOOD Blood Culture adequate volume Performed at [facility], [address], [place], [place] [address]    Special Requests   Final    BLOOD LEFT ARM Performed at [facility], [address], [place], [place] [address]    Culture  Setup Time   Final    GRAM NEGATIVE RODS IN BOTH AEROBIC AND ANAEROBIC BOTTLES CRITICAL RESULT CALLED TO, READ BACK BY AND VERIFIED WITH: PHARMD JAMES LEDFORD 11/03/20@4:18 BY TW    Culture (A)  Final    ESCHERICHIA COLI BACTEROIDES FRAGILIS BETA LACTAMASE POSITIVE Performed at [facility], [address] , [place] [address]    Report Status 11/06/2020 FINAL  Final   Organism ID, Bacteria ESCHERICHIA COLI  Final      Susceptibility   Escherichia coli - MIC*    AMPICILLIN >=32 RESISTANT Resistant     CEFAZOLIN <=4 SENSITIVE Sensitive     CEFEPIME <=0.12 SENSITIVE Sensitive     CEFTAZIDIME <=1 SENSITIVE Sensitive     CEFTRIAXONE <=0.25 SENSITIVE Sensitive     CIPROFLOXACIN <=0.25 SENSITIVE Sensitive     GENTAMICIN <=1 SENSITIVE Sensitive     IMIPENEM <=0.25 SENSITIVE Sensitive     TRIMETH/SULFA >=320 RESISTANT Resistant     AMPICILLIN/SULBACTAM >=32 RESISTANT Resistant     PIP/TAZO <=4 SENSITIVE Sensitive     *   ESCHERICHIA COLI   Blood Culture ID Panel (Reflexed)     Status: Abnormal   Collection Time: 11/02/20  2:00 AM  Result Value Ref Range Status   Enterococcus faecalis NOT DETECTED NOT DETECTED Final   Enterococcus Faecium NOT DETECTED NOT DETECTED Final   Listeria monocytogenes NOT DETECTED NOT DETECTED Final   Staphylococcus species NOT DETECTED NOT DETECTED Final   Staphylococcus aureus (BCID) NOT DETECTED NOT DETECTED Final   Staphylococcus epidermidis NOT DETECTED NOT DETECTED Final   Staphylococcus lugdunensis NOT DETECTED NOT DETECTED Final   Streptococcus species NOT DETECTED NOT DETECTED Final   Streptococcus agalactiae NOT DETECTED NOT DETECTED Final   Streptococcus pneumoniae NOT DETECTED NOT DETECTED Final   Streptococcus pyogenes NOT DETECTED NOT DETECTED Final   A.calcoaceticus-baumannii NOT DETECTED NOT DETECTED Final   Bacteroides fragilis NOT DETECTED NOT DETECTED Final   Enterobacterales DETECTED (A) NOT DETECTED Final    Comment: Enterobacterales represent a large order of gram negative bacteria, not a single organism. CRITICAL RESULT CALLED TO, READ BACK BY AND VERIFIED WITH: PHARMD JAMES LEDFORD 11/03/20_0 :18 BY TW    Enterobacter cloacae complex NOT DETECTED NOT DETECTED Final   Escherichia coli DETECTED (A) NOT DETECTED Final    Comment: CRITICAL RESULT CALLED TO, READ BACK BY AND VERIFIED WITH: PHARMD JAMES LEDFORD 11/03/20_1 :18 BY TW    Klebsiella aerogenes NOT DETECTED NOT DETECTED Final   Klebsiella oxytoca NOT DETECTED NOT DETECTED Final   Klebsiella pneumoniae NOT DETECTED NOT DETECTED Final   Proteus species NOT DETECTED NOT DETECTED Final   Salmonella species NOT DETECTED NOT DETECTED Final   Serratia marcescens NOT DETECTED NOT DETECTED Final   Haemophilus influenzae NOT DETECTED NOT DETECTED Final   Neisseria meningitidis NOT DETECTED NOT DETECTED Final   Pseudomonas aeruginosa NOT DETECTED NOT DETECTED Final   Stenotrophomonas maltophilia NOT DETECTED NOT DETECTED  Final   Candida albicans NOT DETECTED NOT DETECTED Final   Candida auris NOT DETECTED NOT DETECTED Final   Candida glabrata NOT DETECTED NOT DETECTED Final   Candida krusei NOT DETECTED NOT DETECTED Final   Candida parapsilosis NOT DETECTED NOT DETECTED Final   Candida tropicalis NOT DETECTED NOT DETECTED Final   Cryptococcus neoformans/gattii NOT DETECTED NOT DETECTED Final   CTX-M ESBL NOT DETECTED NOT DETECTED Final   Carbapenem resistance IMP NOT DETECTED NOT DETECTED Final   Carbapenem resistance KPC NOT DETECTED NOT DETECTED Final   Carbapenem resistance NDM NOT DETECTED NOT DETECTED Final   Carbapenem resist OXA 48 LIKE NOT DETECTED NOT DETECTED Final   Carbapenem resistance VIM NOT DETECTED NOT DETECTED Final    Comment: Performed at [facility], [place] [address], [place], [place] [address]  Blood culture (routine x 2)     Status: None   Collection Time: 11/02/20  2:52 AM   Specimen: BLOOD  Result Value Ref Range Status   Specimen Description   Final    BLOOD Blood Culture results may not be optimal due to an inadequate volume of blood received in culture bottles Performed at [facility], [address] [place], [place] [address]    Special Requests   Final    RIGHT ANTECUBITAL Performed at [facility], [address], [place], [place] [address]    Culture   Final    NO GROWTH 5 DAYS Performed at [facility], [place] [address], [place], [place] [address]    Report Status 11/07/2020 FINAL  Final       Radiology Studies:  CT CHEST ABDOMEN PELVIS WO CONTRAST  Result Date: 11/07/2020 CLINICAL DATA:  Abdominal pain. EXAM: CT CHEST, ABDOMEN AND PELVIS WITHOUT CONTRAST TECHNIQUE: Multidetector CT imaging of the chest, abdomen and pelvis was performed following the standard protocol without IV contrast. COMPARISON:  CT abdomen pelvis dated November 02, 2020. Chest x-ray dated June 12, 2020. CT chest dated January 06, 2017.  FINDINGS: CT CHEST FINDINGS Cardiovascular: Normal heart size. New trace pericardial effusion. No thoracic aortic aneurysm. Coronary, aortic arch, and branch vessel atherosclerotic vascular disease. Mediastinum/Nodes: No enlarged mediastinal, hilar, or axillary lymph nodes. Thyroid gland, trachea, and esophagus demonstrate no significant findings. Large hiatal hernia again noted. Lungs/Pleura: Increased moderate right and small left pleural effusions. Complete collapse of the right lower lobe. Dependent atelectasis in the left lower lobe, right upper lobe, and right middle lobe. Mild centrilobular emphysema. Scattered smooth mild interlobular septal thickening. No pneumothorax. Musculoskeletal: No acute or significant osseous findings. Old left-sided rib fractures. CT ABDOMEN PELVIS FINDINGS Hepatobiliary: Unchanged small cyst in the peripheral left hepatic lobe. No new focal liver abnormality. Normal gallbladder. No biliary dilatation. Pancreas: Similar atrophy without ductal dilatation or surrounding inflammatory change. Spleen: Normal in size without focal abnormality. Adrenals/Urinary Tract: Adrenal glands are unremarkable. Unchanged small left renal simple cyst. No renal calculi or hydronephrosis. The bladder is unremarkable. Stomach/Bowel: Unchanged large hiatal hernia. No bowel wall thickening, distention, or surrounding inflammatory changes. Oral contrast in the colon. Interval appendectomy. Vascular/Lymphatic: Aortic atherosclerosis. No enlarged abdominal or pelvic lymph nodes. Reproductive: Status post hysterectomy. No adnexal masses. Other: New surgical drain in the right abdomen. Resolved pneumoperitoneum. Ill-defined fluid and phlegmonous change in the right abdomen measuring 1.7 x 2.6 x 3.4 cm (series 3, image 83; series 6, image 74) without discrete drainable fluid collection. Musculoskeletal: No acute or significant osseous findings. IMPRESSION: 1. Interval appendectomy with ill-defined fluid and  phlegmonous change in the right abdomen without discrete drainable abscess. 2. Increased moderate right and small left pleural effusions with complete collapse of the right lower lobe. 3. Aortic Atherosclerosis (ICD10-I70.0) and Emphysema (ICD10-J43.9). Electronically Signed   By: William T Derry M.D.   On: 11/07/2020 13:12    Scheduled Meds:  amLODipine  10 mg Oral q morning   atorvastatin  40 mg Oral q1800   bisacodyl  10 mg Rectal Once   docusate sodium  100 mg Oral BID   enoxaparin (LOVENOX) injection  40 mg Subcutaneous Daily   feeding supplement  237 mL Oral BID BM   fluticasone  2 spray Each Nare Daily   fluticasone furoate-vilanterol  1 puff Inhalation Daily   gabapentin  100 mg Oral BID   metroNIDAZOLE  500 mg Oral Q12H   pantoprazole (PROTONIX) IV  40 mg Intravenous Q12H   Continuous Infusions:   ceFAZolin (ANCEF) IV 2 g (11/09/20 0601)   methocarbamol (ROBAXIN) IV       LOS: 7 days   Time spent: 29 minutes    , MD [facility]  11/09/2020, 10:53 AM  Please page via Amion and do not message via secure chat for anything urgent. Secure chat can be used for anything non urgent.  How to contact the [facility] Attending or Consulting provider 7A - 7P or covering provider during after hours 7P -7A, for this patient?  Check the care team in [facility] and look for a) attending/consulting [facility] provider listed and b) the [facility] team listed. Page or secure chat 7A-7P. Log into [email or web address] and use [facility]'s universal password to access. If you   do not have the password, please contact the hospital operator. Locate the [facility] provider you are looking for under [facility] and page to a number that you can be directly reached. If you still have difficulty reaching the provider, please page the DOC (Director on Call) for the Hospitalists listed on amion for assistance.  

## 2020-11-09 NOTE — Progress Notes (Signed)
Progress Note  6 Days Post-Op  Subjective: Patient complains of chest pain this morning that if she just had her coffee would be eased up.  RN states this is something they have been dealing with so doesn't sound new.  She appears stable.  She does complain of some abdominal pain in the RLQ, but is eating well and moving her bowels.  Objective: Vital signs in last 24 hours: Temp:  [98 F (36.7 C)-98.6 F (37 C)] 98.2 F (36.8 C) (10/30 0732) Pulse Rate:  [94-100] 98 (10/30 0732) Resp:  [16-20] 17 (10/30 0732) BP: (119-145)/(66-83) 145/75 (10/30 0732) SpO2:  [82 %-93 %] 90 % (10/30 0800) Last BM Date: 11/01/20  Intake/Output from previous day: 10/29 0701 - 10/30 0700 In: 402.4 [P.O.:236; IV Piggyback:166.4] Out: 20 [Drains:20] Intake/Output this shift: No intake/output data recorded.  PE: Abd: Soft, ND, ttp along RLQ but no more than expected at this point, incisions C/D/I, drain with serous drainage   Lab Results:  Recent Labs    11/07/20 0202 11/08/20 0030  WBC 8.7 8.6  HGB 11.3* 11.5*  HCT 33.3* 34.8*  PLT 277 332   BMET Recent Labs    11/08/20 0030 11/09/20 0425  NA 130* 131*  K 4.3 4.5  CL 97* 99  CO2 27 22  GLUCOSE 106* 92  BUN 6* <5*  CREATININE 0.86 0.81  CALCIUM 8.1* 8.3*   PT/INR No results for input(s): LABPROT, INR in the last 72 hours. CMP     Component Value Date/Time   NA 131 (L) 11/09/2020 0425   NA 141 06/06/2015 0000   K 4.5 11/09/2020 0425   K 4.7 06/13/2015 0000   CL 99 11/09/2020 0425   CO2 22 11/09/2020 0425   GLUCOSE 92 11/09/2020 0425   BUN <5 (L) 11/09/2020 0425   BUN 8 06/06/2015 0000   CREATININE 0.81 11/09/2020 0425   CALCIUM 8.3 (L) 11/09/2020 0425   PROT 5.4 (L) 11/05/2020 0143   ALBUMIN 1.9 (L) 11/05/2020 0143   AST 21 11/05/2020 0143   ALT 12 11/05/2020 0143   ALKPHOS 82 11/05/2020 0143   BILITOT 0.6 11/05/2020 0143   GFRNONAA >60 11/09/2020 0425   GFRAA >60 06/28/2018 0535   Lipase     Component Value  Date/Time   LIPASE 21 11/04/2020 0131       Studies/Results: CT CHEST ABDOMEN PELVIS WO CONTRAST  Result Date: 11/07/2020 CLINICAL DATA:  Abdominal pain. EXAM: CT CHEST, ABDOMEN AND PELVIS WITHOUT CONTRAST TECHNIQUE: Multidetector CT imaging of the chest, abdomen and pelvis was performed following the standard protocol without IV contrast. COMPARISON:  CT abdomen pelvis dated November 02, 2020. Chest x-ray dated June 12, 2020. CT chest dated January 06, 2017. FINDINGS: CT CHEST FINDINGS Cardiovascular: Normal heart size. New trace pericardial effusion. No thoracic aortic aneurysm. Coronary, aortic arch, and branch vessel atherosclerotic vascular disease. Mediastinum/Nodes: No enlarged mediastinal, hilar, or axillary lymph nodes. Thyroid gland, trachea, and esophagus demonstrate no significant findings. Large hiatal hernia again noted. Lungs/Pleura: Increased moderate right and small left pleural effusions. Complete collapse of the right lower lobe. Dependent atelectasis in the left lower lobe, right upper lobe, and right middle lobe. Mild centrilobular emphysema. Scattered smooth mild interlobular septal thickening. No pneumothorax. Musculoskeletal: No acute or significant osseous findings. Old left-sided rib fractures. CT ABDOMEN PELVIS FINDINGS Hepatobiliary: Unchanged small cyst in the peripheral left hepatic lobe. No new focal liver abnormality. Normal gallbladder. No biliary dilatation. Pancreas: Similar atrophy without ductal dilatation or surrounding  inflammatory change. Spleen: Normal in size without focal abnormality. Adrenals/Urinary Tract: Adrenal glands are unremarkable. Unchanged small left renal simple cyst. No renal calculi or hydronephrosis. The bladder is unremarkable. Stomach/Bowel: Unchanged large hiatal hernia. No bowel wall thickening, distention, or surrounding inflammatory changes. Oral contrast in the colon. Interval appendectomy. Vascular/Lymphatic: Aortic atherosclerosis. No  enlarged abdominal or pelvic lymph nodes. Reproductive: Status post hysterectomy. No adnexal masses. Other: New surgical drain in the right abdomen. Resolved pneumoperitoneum. Ill-defined fluid and phlegmonous change in the right abdomen measuring 1.7 x 2.6 x 3.4 cm (series 3, image 83; series 6, image 74) without discrete drainable fluid collection. Musculoskeletal: No acute or significant osseous findings. IMPRESSION: 1. Interval appendectomy with ill-defined fluid and phlegmonous change in the right abdomen without discrete drainable abscess. 2. Increased moderate right and small left pleural effusions with complete collapse of the right lower lobe. 3. Aortic Atherosclerosis (ICD10-I70.0) and Emphysema (ICD10-J43.9). Electronically Signed   By: Titus Dubin M.D.   On: 11/07/2020 13:12    Anti-infectives: Anti-infectives (From admission, onward)    Start     Dose/Rate Route Frequency Ordered Stop   11/07/20 0600  ceFAZolin (ANCEF) IVPB 2g/100 mL premix        2 g 200 mL/hr over 30 Minutes Intravenous Every 8 hours 11/06/20 1518 11/16/20 2359   11/05/20 1030  metroNIDAZOLE (FLAGYL) tablet 500 mg        500 mg Oral Every 12 hours 11/05/20 0931 11/16/20 2359   11/03/20 0530  cefTRIAXone (ROCEPHIN) 2 g in sodium chloride 0.9 % 100 mL IVPB  Status:  Discontinued        2 g 200 mL/hr over 30 Minutes Intravenous Every 24 hours 11/03/20 0430 11/06/20 1518   11/02/20 1600  ceFEPIme (MAXIPIME) 2 g in sodium chloride 0.9 % 100 mL IVPB  Status:  Discontinued        2 g 200 mL/hr over 30 Minutes Intravenous Every 12 hours 11/02/20 1457 11/03/20 0430   11/02/20 1000  cefTRIAXone (ROCEPHIN) 2 g in sodium chloride 0.9 % 100 mL IVPB  Status:  Discontinued        2 g 200 mL/hr over 30 Minutes Intravenous Every 24 hours 11/02/20 0616 11/02/20 1454   11/02/20 1000  metroNIDAZOLE (FLAGYL) IVPB 500 mg  Status:  Discontinued        500 mg 100 mL/hr over 60 Minutes Intravenous Every 8 hours 11/02/20 0616 11/05/20  0931   11/02/20 0200  ceFEPIme (MAXIPIME) 2 g in sodium chloride 0.9 % 100 mL IVPB       See Hyperspace for full Linked Orders Report.   2 g 200 mL/hr over 30 Minutes Intravenous  Once 11/02/20 0153 11/02/20 0323   11/02/20 0200  metroNIDAZOLE (FLAGYL) IVPB 500 mg       See Hyperspace for full Linked Orders Report.   500 mg 100 mL/hr over 60 Minutes Intravenous  Once 11/02/20 0153 11/02/20 0352        Assessment/Plan Perforated appendicitis with feculent and purulent peritonitis  POD#6 s/p Laparoscopic lysis of adhesions x30 minutes, laparoscopic appendectomy, washout and drain placement 10/24 Dr. Kae Heller - continue IV antibiotics, needs at least 1 week abx postop and could be switched to oral at discharge. She does have an E coli bacteremia - abx per primary for this - continue JP drain and monitor output - currently serous - tolerating diet, CT scan with no definitive abscess. - mobilize, continue PT - no follow up recommended -WBC normal -patient is  surgically stable, defer further care to medical service   ID - rocephin/flagyl 10/23 FEN - regular diet VTE - SCDs, lovenox Foley - none   E colic bacteremia COPD Hypoxia - weaning supplemental O2 Rheumatoid arthritis  HTN HLD GERD AKI on CKD - Cr 0.0.81 Anemia - Hgb 11.3, stable Tobacco abuse Cocaine use  LOS: 7 days    Henreitta Cea, [facility] Surgery 11/09/2020, 8:15 AM Please see Amion for pager number during day hours 7:00am-4:30pm

## 2020-11-10 ENCOUNTER — Other Ambulatory Visit (HOSPITAL_COMMUNITY): Payer: Self-pay

## 2020-11-10 DIAGNOSIS — K3532 Acute appendicitis with perforation and localized peritonitis, without abscess: Secondary | ICD-10-CM | POA: Diagnosis not present

## 2020-11-10 MED ORDER — POLYETHYLENE GLYCOL 3350 17 G PO PACK: 17.0000 g | PACK | Freq: Every day | ORAL | Status: DC

## 2020-11-10 MED ORDER — METRONIDAZOLE 500 MG PO TABS
500.0000 mg | ORAL_TABLET | Freq: Three times a day (TID) | ORAL | 0 refills | Status: DC
  Filled 2020-11-10: qty 24, 8d supply, fill #0

## 2020-11-10 MED ORDER — CEFADROXIL 500 MG PO CAPS
1000.0000 mg | ORAL_CAPSULE | Freq: Two times a day (BID) | ORAL | 0 refills | Status: DC
  Filled 2020-11-10: qty 32, 8d supply, fill #0

## 2020-11-10 NOTE — Discharge Summary (Signed)
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   Candida auris NOT DETECTED NOT DETECTED Final   Candida glabrata NOT DETECTED NOT DETECTED Final   Candida krusei NOT DETECTED NOT DETECTED Final   Candida parapsilosis NOT DETECTED NOT DETECTED Final   Candida tropicalis NOT DETECTED NOT DETECTED Final   Cryptococcus neoformans/gattii NOT DETECTED NOT DETECTED Final   CTX-M ESBL NOT DETECTED NOT DETECTED Final   Carbapenem resistance IMP NOT DETECTED NOT DETECTED Final   Carbapenem resistance KPC NOT DETECTED NOT DETECTED Final   Carbapenem resistance NDM NOT DETECTED NOT DETECTED Final   Carbapenem resist OXA 48 LIKE NOT DETECTED NOT DETECTED Final   Carbapenem resistance VIM NOT DETECTED NOT DETECTED Final    Comment:  Performed at [facility], [address], [place], [place] [address]  Blood culture (routine x 2)     Status: None   Collection Time: 11/02/20  2:52 AM   Specimen: BLOOD  Result Value Ref Range Status   Specimen Description   Final    BLOOD Blood Culture results may not be optimal due to an inadequate volume of blood received in culture bottles Performed at [facility], [address] [place], [place] [address]    Special Requests   Final    RIGHT ANTECUBITAL Performed at  [facility], [address] [place], [place] [address]    Culture   Final    NO GROWTH 5 DAYS Performed at [facility], [place] [address] [place], [place] [address]    Report Status 11/07/2020 FINAL  Final     Labs: BNP (last 3 results) No results for input(s): BNP in the last 8760 hours. Basic Metabolic Panel: Recent Labs  Lab 11/04/20 0131 11/05/20 0143 11/06/20 0125 11/08/20 0030 11/09/20 0425  NA 135 133* 133* 130* 131*  K 4.8 4.3 4.7 4.3 4.5  CL 110 106 103 97* 99  CO2 19* 19* 21* 27 22  GLUCOSE 90 116* 78 106* 92  BUN 28* 17 10 6* <5*  CREATININE 1.42* 0.95 0.95 0.86 0.81  CALCIUM 8.1* 8.1* 8.4* 8.1* 8.3*  MG 1.6* 1.9 1.6*  --   --    Liver Function Tests: Recent Labs  Lab 11/04/20 0131 11/05/20 0143  AST 24 21  ALT 15 12  ALKPHOS 72 82  BILITOT 0.7 0.6  PROT 5.4* 5.4*  ALBUMIN 2.0* 1.9*   Recent Labs  Lab 11/04/20 0131  LIPASE 21   No results for input(s): AMMONIA in the last 168 hours. CBC: Recent Labs  Lab 11/04/20 0131 11/05/20 0143 11/06/20 0125 11/07/20 0202 11/08/20 0030  WBC 10.2 11.3* 10.0 8.7 8.6  NEUTROABS 8.7* 9.5* 7.3 5.0 5.2  HGB 11.4* 11.3* 13.5 11.3* 11.5*  HCT 34.1* 34.0* 40.0 33.3* 34.8*  MCV 88.3 87.2 87.9 87.2 88.3  PLT 230 253 268 277 332   Cardiac Enzymes: No results for input(s): CKTOTAL, CKMB, CKMBINDEX, TROPONINI in the last 168 hours. BNP: Invalid input(s): POCBNP CBG: No results for  input(s): GLUCAP in the last 168 hours. D-Dimer No results for input(s): DDIMER in the last 72 hours. Hgb A1c No results for input(s): HGBA1C in the last 72 hours. Lipid Profile No results for input(s): CHOL, HDL, LDLCALC, TRIG, CHOLHDL, LDLDIRECT in the last 72 hours. Thyroid function studies No results for input(s): TSH, T4TOTAL, T3FREE, THYROIDAB in the last 72 hours.  Invalid input(s): FREET3 Anemia work up No results for input(s): VITAMINB12, FOLATE, FERRITIN, TIBC, IRON, RETICCTPCT in the last 72 hours. Urinalysis    Component Value Date/Time   COLORURINE STRAW (A) 06/23/2018 2141   APPEARANCEUR CLEAR 06/23/2018 2141   LABSPEC 1.011 06/23/2018 2141   PHURINE 5.0 06/23/2018 2141   GLUCOSEU NEGATIVE 06/23/2018 2141   HGBUR SMALL (A) 06/23/2018 2141   BILIRUBINUR NEGATIVE 06/23/2018 2141   KETONESUR NEGATIVE 06/23/2018 2141   PROTEINUR NEGATIVE 06/23/2018 2141   UROBILINOGEN 2.0 (H) 10/28/2014 0141   NITRITE NEGATIVE 06/23/2018 2141   LEUKOCYTESUR TRACE (A) 06/23/2018 2141   Sepsis Labs Invalid input(s): PROCALCITONIN,  WBC,  LACTICIDVEN Microbiology Recent Results (from the past 240 hour(s))  Resp Panel by RT-PCR (Flu A&B, Covid) Nasopharyngeal Swab     Status: None   Collection Time: 11/02/20  1:46 AM   Specimen: Nasopharyngeal Swab; Nasopharyngeal(NP) swabs in vial transport medium  Result Value Ref Range Status   SARS Coronavirus 2 by RT PCR NEGATIVE NEGATIVE Final    Comment: (NOTE) SARS-CoV-2 target nucleic acids are NOT DETECTED.  The SARS-CoV-2 RNA is generally detectable in upper respiratory specimens during the acute phase of infection. The lowest concentration of SARS-CoV-2 viral copies this assay can detect is 138 copies/mL. A negative result does not preclude SARS-Cov-2 infection and should not be used as the sole basis for treatment or other patient management decisions. A negative  result may occur with  improper specimen collection/handling, submission  of specimen other than nasopharyngeal swab, presence of viral mutation(s) within the areas targeted by this assay, and inadequate number of viral copies(<138 copies/mL). A negative result must be combined with clinical observations, patient history, and epidemiological information. The expected result is Negative.  Fact Sheet for Patients:  EntrepreneurPulse.com.au  Fact Sheet for Healthcare Providers:  IncredibleEmployment.be  This test is no t yet approved or cleared by the [place] FDA and  has been authorized for detection and/or diagnosis of SARS-CoV-2 by FDA under an Emergency Use Authorization (EUA). This EUA will remain  in effect (meaning this test can be used) for the duration of the COVID-19 declaration under Section 564(b)(1) of the Act, 21 U.S.C.section 360bbb-3(b)(1), unless the authorization is terminated  or revoked sooner.       Influenza A by PCR NEGATIVE NEGATIVE Final   Influenza B by PCR NEGATIVE NEGATIVE Final    Comment: (NOTE) The Xpert Xpress SARS-CoV-2/FLU/RSV plus assay is intended as an aid in the diagnosis of influenza from Nasopharyngeal swab specimens and should not be used as a sole basis for treatment. Nasal washings and aspirates are unacceptable for Xpert Xpress SARS-CoV-2/FLU/RSV testing.  Fact Sheet for Patients: EntrepreneurPulse.com.au  Fact Sheet for Healthcare Providers: IncredibleEmployment.be  This test is not yet approved or cleared by the [place] FDA and has been authorized for detection and/or diagnosis of SARS-CoV-2 by FDA under an Emergency Use Authorization (EUA). This EUA will remain in effect (meaning this test can be used) for the duration of the COVID-19 declaration under Section 564(b)(1) of the Act, 21 U.S.C. section 360bbb-3(b)(1), unless the authorization is terminated or revoked.  Performed at [facility], [address], [place], [place] [address]   Blood culture (routine x 2)     Status: Abnormal   Collection Time: 11/02/20  2:00 AM   Specimen: BLOOD  Result Value Ref Range Status   Specimen Description   Final    BLOOD Blood Culture adequate volume Performed at [facility], [address], [place], [place] [address]    Special Requests   Final    BLOOD LEFT ARM Performed at [facility], [address], [place], [place] [address]    Culture  Setup Time   Final    GRAM NEGATIVE RODS IN BOTH AEROBIC AND ANAEROBIC BOTTLES CRITICAL RESULT CALLED TO, READ BACK BY AND VERIFIED WITH: PHARMD JAMES LEDFORD 11/03/20@4 :18 BY TW    Culture (A)  Final    ESCHERICHIA COLI BACTEROIDES FRAGILIS BETA LACTAMASE POSITIVE Performed at [facility], [place] [address], [place],  [address]    Report Status 11/06/2020 FINAL  Final   Organism ID, Bacteria ESCHERICHIA COLI  Final      Susceptibility   Escherichia coli - MIC*    AMPICILLIN >=32 RESISTANT Resistant     CEFAZOLIN <=4 SENSITIVE Sensitive     CEFEPIME <=0.12 SENSITIVE Sensitive     CEFTAZIDIME <=1 SENSITIVE Sensitive     CEFTRIAXONE <=0.25 SENSITIVE Sensitive     CIPROFLOXACIN <=0.25 SENSITIVE Sensitive     GENTAMICIN <=1 SENSITIVE Sensitive     IMIPENEM <=0.25 SENSITIVE Sensitive     TRIMETH/SULFA >=320 RESISTANT Resistant     AMPICILLIN/SULBACTAM >=32 RESISTANT Resistant     PIP/TAZO <=4 SENSITIVE Sensitive     * ESCHERICHIA COLI  Blood Culture ID Panel (Reflexed)     Status: Abnormal   Collection Time: 11/02/20  2:00 AM  Result Value Ref Range Status   Enterococcus faecalis NOT DETECTED NOT DETECTED Final   Enterococcus Faecium NOT DETECTED NOT DETECTED Final   Listeria monocytogenes NOT DETECTED NOT DETECTED Final   Staphylococcus species NOT DETECTED NOT DETECTED Final   Staphylococcus aureus (BCID) NOT DETECTED NOT DETECTED Final   Staphylococcus epidermidis NOT DETECTED NOT  DETECTED Final   Staphylococcus lugdunensis NOT DETECTED NOT DETECTED Final   Streptococcus species NOT DETECTED NOT DETECTED Final   Streptococcus agalactiae NOT DETECTED NOT DETECTED Final   Streptococcus pneumoniae NOT DETECTED NOT DETECTED Final   Streptococcus pyogenes NOT DETECTED NOT DETECTED Final   A.calcoaceticus-baumannii NOT DETECTED NOT DETECTED Final   Bacteroides fragilis NOT DETECTED NOT DETECTED Final   Enterobacterales DETECTED (A) NOT DETECTED Final    Comment: Enterobacterales represent a large order of gram negative bacteria, not a single organism. CRITICAL RESULT CALLED TO, READ BACK BY AND VERIFIED WITH: PHARMD JAMES LEDFORD 11/03/20@4 :18 BY TW    Enterobacter cloacae complex NOT DETECTED NOT DETECTED Final   Escherichia coli DETECTED (A) NOT DETECTED Final    Comment: CRITICAL RESULT CALLED TO, READ BACK BY AND VERIFIED WITH: PHARMD JAMES LEDFORD 11/03/20@4 :18 BY TW    Klebsiella aerogenes NOT DETECTED NOT DETECTED Final   Klebsiella oxytoca NOT DETECTED NOT DETECTED Final   Klebsiella pneumoniae NOT DETECTED NOT DETECTED Final   Proteus species NOT DETECTED NOT DETECTED Final   Salmonella species NOT DETECTED NOT DETECTED Final   Serratia marcescens NOT DETECTED NOT DETECTED Final   Haemophilus influenzae NOT DETECTED NOT DETECTED Final   Neisseria meningitidis NOT DETECTED NOT DETECTED Final   Pseudomonas aeruginosa NOT DETECTED NOT DETECTED Final   Stenotrophomonas maltophilia NOT DETECTED NOT DETECTED Final   Candida albicans NOT DETECTED NOT DETECTED Final   Candida auris NOT DETECTED NOT DETECTED Final   Candida glabrata NOT DETECTED NOT DETECTED Final   Candida krusei NOT DETECTED NOT DETECTED Final   Candida parapsilosis NOT DETECTED NOT DETECTED Final   Candida tropicalis NOT DETECTED NOT DETECTED Final   Cryptococcus neoformans/gattii NOT DETECTED NOT DETECTED Final   CTX-M ESBL NOT DETECTED NOT DETECTED Final   Carbapenem resistance IMP NOT  DETECTED NOT DETECTED Final   Carbapenem resistance KPC NOT DETECTED NOT DETECTED Final   Carbapenem resistance NDM NOT DETECTED NOT DETECTED Final   Carbapenem resist OXA 48 LIKE NOT DETECTED NOT DETECTED Final   Carbapenem resistance VIM NOT DETECTED NOT DETECTED Final    Comment: Performed at [facility], [address], [place], [place] [address]  Blood culture (routine x 2)     Status: None   Collection Time: 11/02/20  2:52 AM   Specimen: BLOOD  Result Value Ref Range Status   Specimen Description   Final    BLOOD Blood Culture results may not be optimal due to an inadequate volume of blood received in culture bottles Performed at [facility], [address] [place], [place] [address]    Special Requests   Final    RIGHT ANTECUBITAL Performed at [facility], [address] [place], [place] [address]    Culture   Final    NO GROWTH 5 DAYS Performed at [facility], [place] [address], [place], [place] [address]    Report Status 11/07/2020 FINAL  Final     Time coordinating discharge: Over 30 minutes  SIGNED:   Darliss Cheney, MD  [facility] 11/10/2020, 8:00 AM  If 7PM-7AM, please contact night-coverage [email or web address]

## 2020-11-10 NOTE — Progress Notes (Signed)
Physical Therapy Treatment Patient Details Name: [name] MRN: [id number] DOB: 1955-05-29 Today's Date: 11/10/2020   History of Present Illness Pt is a 65 y.o. female admitted on 11/02/20 for sepsis secondary to perforated appendicitis, ileus, E. coli bacteremia. S/p laproscopic appendectomy 10/24. PMH includes CKD III, HTN, COPD, RA, active smoker.    PT Comments    Pt received in room, transferring to/from [facility] Pt required supervision transfers and supervision ambulation 100' x 3 with rollator. Seated rest break required between gait trials. Desat to 80% during amb on RA. Placed on 3L with improvement to 87%. Increased to 4L with improvement to 90%. Pt on 3L at rest at end of session with SpO2 93%.    Recommendations for follow up therapy are one component of a multi-disciplinary discharge planning process, led by the attending physician.  Recommendations may be updated based on patient status, additional functional criteria and insurance authorization.  Follow Up Recommendations  No PT follow up     Assistance Recommended at Discharge PRN  Equipment Recommendations  None recommended by PT    Recommendations for Other Services       Precautions / Restrictions Precautions Precautions: Fall;Other (comment) Precaution Comments: abdominal JP drain     Mobility  Bed Mobility Overal bed mobility: Modified Independent                  Transfers Overall transfer level: Needs assistance Equipment used: Ambulation equipment used Transfers: Sit to/from Stand;Stand Pivot Transfers Sit to Stand: Modified independent (Device/Increase time) Stand pivot transfers: Supervision         General transfer comment: supervision for safety with pivot    Ambulation/Gait Ambulation/Gait assistance: Supervision Gait Distance (Feet): 100 Feet (x 3) Assistive device: Rollator (4 wheels) Gait Pattern/deviations: Step-through pattern;Decreased stride length;Trunk flexed Gait  velocity: decreased Gait velocity interpretation: <1.31 ft/sec, indicative of household ambulator General Gait Details: slow, guarded gait. Decreased foot clearance bilat. Desat to 80% on RA. Placed on 3L with improvement to 87%. Increased to 4L with improvement to 90%.   Stairs             Wheelchair Mobility    Modified Rankin (Stroke Patients Only)       Balance Overall balance assessment: Needs assistance Sitting-balance support: No upper extremity supported;Feet supported Sitting balance-Leahy Scale: Normal       Standing balance-Leahy Scale: Fair Standing balance comment: static stand without UE support                            Cognition Arousal/Alertness: Awake/alert Behavior During Therapy: WFL for tasks assessed/performed Overall Cognitive Status: Within Functional Limits for tasks assessed                                 General Comments: internally distracted by pain        Exercises      General Comments        Pertinent Vitals/Pain Pain Assessment: Faces Faces Pain Scale: Hurts even more Pain Location: R-side trunk Pain Descriptors / Indicators: Grimacing;Guarding;Discomfort;Sore Pain Intervention(s): Monitored during session;Repositioned    Home Living                          Prior Function            PT Goals (current goals can now be found in the  care plan section) Acute Rehab PT Goals Patient Stated Goal: Decreased pain Progress towards PT goals: Progressing toward goals    Frequency    Min 3X/week      PT Plan Current plan remains appropriate    Co-evaluation              AM-PAC PT "6 Clicks" Mobility   Outcome Measure  Help needed turning from your back to your side while in a flat bed without using bedrails?: None Help needed moving from lying on your back to sitting on the side of a flat bed without using bedrails?: None Help needed moving to and from a bed to a chair  (including a wheelchair)?: A Little Help needed standing up from a chair using your arms (e.g., wheelchair or bedside chair)?: None Help needed to walk in hospital room?: A Little Help needed climbing 3-5 steps with a railing? : A Little 6 Click Score: 21    End of Session Equipment Utilized During Treatment: Oxygen;Gait belt Activity Tolerance: Patient tolerated treatment well Patient left: in bed;with call bell/phone within reach Nurse Communication: Mobility status PT Visit Diagnosis: Other abnormalities of gait and mobility (R26.89);Pain Pain - Right/Left: Right     Time: 9379-0240 PT Time Calculation (min) (ACUTE ONLY): 35 min  Charges:  $Gait Training: 23-37 mins                     [name], [place]  Office # [phone] Pager [phone]    [name] 11/10/2020, 9:47 AM

## 2020-11-10 NOTE — Progress Notes (Signed)
PT Progress Note  SATURATION QUALIFICATIONS: (This note is used to comply with regulatory documentation for home oxygen)  Patient Saturations on Room Air at Rest = 94%  Patient Saturations on Room Air while Ambulating = 80%  Patient Saturations on 4 Liters of oxygen while Ambulating = 90%  Please briefly explain why patient needs home oxygen: Pt required supplemental O2 to maintain SpO2 at 90% during ambulation.  Lorrin Goodell, PT  Office # [phone] Pager [phone]

## 2020-11-10 NOTE — Progress Notes (Signed)
Patient ID: [name], female   DOB: 12/23/1955, 65 y.o.   MRN: [id number] [facility] Surgery Progress Note  7 Days Post-Op  Subjective: CC-  Continues to have some RLQ abdominal pain but it is much improved since last week. No n/v. Tolerating diet.  Also states that she is seeing shadows, and has a small lump on the left side of her neck - will reach out to primary team  Objective: Vital signs in last 24 hours: Temp:  [98.2 F (36.8 C)-98.9 F (37.2 C)] 98.5 F (36.9 C) (10/31 0309) Pulse Rate:  [65-100] 97 (10/31 0309) Resp:  [17-20] 17 (10/31 0309) BP: (92-155)/(67-94) 92/70 (10/31 0309) SpO2:  [88 %-99 %] 95 % (10/31 0818) Last BM Date: 11/08/20  Intake/Output from previous day: 10/30 0701 - 10/31 0700 In: 240 [P.O.:240] Out: -  Intake/Output this shift: No intake/output data recorded.  PE: Gen:  Alert, NAD Pulm: rate and effort normal Abd: Soft, ND, minimal RLQ TTP, +BS, lap incisions cdi, drain with minimal serosanguinous fluid in bulb  Lab Results:  Recent Labs    11/08/20 0030  WBC 8.6  HGB 11.5*  HCT 34.8*  PLT 332   BMET Recent Labs    11/08/20 0030 11/09/20 0425  NA 130* 131*  K 4.3 4.5  CL 97* 99  CO2 27 22  GLUCOSE 106* 92  BUN 6* <5*  CREATININE 0.86 0.81  CALCIUM 8.1* 8.3*   PT/INR No results for input(s): LABPROT, INR in the last 72 hours. CMP     Component Value Date/Time   NA 131 (L) 11/09/2020 0425   NA 141 06/06/2015 0000   K 4.5 11/09/2020 0425   K 4.7 06/13/2015 0000   CL 99 11/09/2020 0425   CO2 22 11/09/2020 0425   GLUCOSE 92 11/09/2020 0425   BUN <5 (L) 11/09/2020 0425   BUN 8 06/06/2015 0000   CREATININE 0.81 11/09/2020 0425   CALCIUM 8.3 (L) 11/09/2020 0425   PROT 5.4 (L) 11/05/2020 0143   ALBUMIN 1.9 (L) 11/05/2020 0143   AST 21 11/05/2020 0143   ALT 12 11/05/2020 0143   ALKPHOS 82 11/05/2020 0143   BILITOT 0.6 11/05/2020 0143   GFRNONAA >60 11/09/2020 0425   GFRAA >60 06/28/2018 0535   Lipase      Component Value Date/Time   LIPASE 21 11/04/2020 0131       Studies/Results: No results found.  Anti-infectives: Anti-infectives (From admission, onward)    Start     Dose/Rate Route Frequency Ordered Stop   11/10/20 0000  cefadroxil (DURICEF) 500 MG capsule        1,000 mg Oral 2 times daily 11/10/20 0759 11/18/20 2359   11/10/20 0000  metroNIDAZOLE (FLAGYL) 500 MG tablet        500 mg Oral 3 times daily 11/10/20 0759 11/18/20 2359   11/07/20 0600  ceFAZolin (ANCEF) IVPB 2g/100 mL premix        2 g 200 mL/hr over 30 Minutes Intravenous Every 8 hours 11/06/20 1518 11/17/20 0959   11/05/20 1030  metroNIDAZOLE (FLAGYL) tablet 500 mg        500 mg Oral Every 12 hours 11/05/20 0931 11/16/20 2359   11/03/20 0530  cefTRIAXone (ROCEPHIN) 2 g in sodium chloride 0.9 % 100 mL IVPB  Status:  Discontinued        2 g 200 mL/hr over 30 Minutes Intravenous Every 24 hours 11/03/20 0430 11/06/20 1518   11/02/20 1600  ceFEPIme (MAXIPIME) 2 g in  sodium chloride 0.9 % 100 mL IVPB  Status:  Discontinued        2 g 200 mL/hr over 30 Minutes Intravenous Every 12 hours 11/02/20 1457 11/03/20 0430   11/02/20 1000  cefTRIAXone (ROCEPHIN) 2 g in sodium chloride 0.9 % 100 mL IVPB  Status:  Discontinued        2 g 200 mL/hr over 30 Minutes Intravenous Every 24 hours 11/02/20 0616 11/02/20 1454   11/02/20 1000  metroNIDAZOLE (FLAGYL) IVPB 500 mg  Status:  Discontinued        500 mg 100 mL/hr over 60 Minutes Intravenous Every 8 hours 11/02/20 0616 11/05/20 0931   11/02/20 0200  ceFEPIme (MAXIPIME) 2 g in sodium chloride 0.9 % 100 mL IVPB       See Hyperspace for full Linked Orders Report.   2 g 200 mL/hr over 30 Minutes Intravenous  Once 11/02/20 0153 11/02/20 0323   11/02/20 0200  metroNIDAZOLE (FLAGYL) IVPB 500 mg       See Hyperspace for full Linked Orders Report.   500 mg 100 mL/hr over 60 Minutes Intravenous  Once 11/02/20 0153 11/02/20 0352        Assessment/Plan Perforated appendicitis  with feculent and purulent peritonitis  POD#7 s/p Laparoscopic lysis of adhesions x30 minutes, laparoscopic appendectomy, washout and drain placement 10/24 Dr. Kae Heller - continue IV antibiotics, needs at least 1 week abx postop and could be switched to oral at discharge. She does have an E coli bacteremia - abx per primary/ID for this (planning oral cefadroxil plus oral metronidazole at the time of discharge to complete total 14 days since the surgery) - tolerating diet and having bowel function - CT 10/28 with no definite abscess  - continue JP drain and monitor output - currently serosanguinous. Plan follow up in the office later this week for removal - mobilize, continue PT - no follow up recommended -patient is surgically stable for discharge, defer further care to medical service   ID - currently ancef/ flagyl FEN - regular diet VTE - SCDs, lovenox Foley - none   E coli bacteremia COPD Hypoxia - weaning supplemental O2 Rheumatoid arthritis  HTN HLD GERD AKI on CKD  Anemia  Tobacco abuse Cocaine use   LOS: 8 days    Wellington [place], [facility] Surgery 11/10/2020, 10:05 AM Please see Amion for pager number during day hours 7:00am-4:30pm

## 2020-11-10 NOTE — Progress Notes (Signed)
Pt being d/c VSS, IV removed education complete. Incentive sent home per MD request.   Chrisandra Carota, RN 11/10/2020 1:07 PM

## 2020-11-10 NOTE — Discharge Instructions (Addendum)
[facility]  Please arrive at least 30 min before your appointment to complete your check in paperwork.  If you are unable to arrive 30 min prior to your appointment time we may have to cancel or reschedule you. LAPAROSCOPIC SURGERY: POST OP INSTRUCTIONS Always review your discharge instruction sheet given to you by the facility where your surgery was performed. IF YOU HAVE DISABILITY OR FAMILY LEAVE FORMS, YOU MUST BRING THEM TO THE OFFICE FOR PROCESSING.   DO NOT GIVE THEM TO YOUR DOCTOR.  PAIN CONTROL  First take acetaminophen (Tylenol) AND/or ibuprofen (Advil) to control your pain after surgery.  Follow directions on package.  Taking acetaminophen (Tylenol) and/or ibuprofen (Advil) regularly after surgery will help to control your pain and lower the amount of prescription pain medication you may need.  You should not take more than 4,000 mg (4 grams) of acetaminophen (Tylenol) in 24 hours.  You should not take ibuprofen (Advil), aleve, motrin, naprosyn or other NSAIDS if you have a history of stomach ulcers or chronic kidney disease.  A prescription for pain medication may be given to you upon discharge.  Take your pain medication as prescribed, if you still have uncontrolled pain after taking acetaminophen (Tylenol) or ibuprofen (Advil). Use ice packs to help control pain. If you need a refill on your pain medication, please contact your pharmacy.  They will contact our office to request authorization. Prescriptions will not be filled after 5pm or on week-ends.  HOME MEDICATIONS Take your usually prescribed medications unless otherwise directed.  DIET You should follow a light diet the first few days after arrival home.  Be sure to include lots of fluids daily. Avoid fatty, fried foods.   CONSTIPATION It is common to experience some constipation after surgery and if you are taking pain medication.  Increasing fluid intake and taking a stool softener (such as Colace)  will usually help or prevent this problem from occurring.  A mild laxative (Milk of Magnesia or Miralax) should be taken according to package instructions if there are no bowel movements after 48 hours.  WOUND/INCISION CARE Most patients will experience some swelling and bruising in the area of the incisions.  Ice packs will help.  Swelling and bruising can take several days to resolve.  Unless discharge instructions indicate otherwise, follow guidelines below  STERI-STRIPS - you may remove your outer bandages 48 hours after surgery, and you may shower at that time.  You have steri-strips (small skin tapes) in place directly over the incision.  These strips should be left on the skin for 7-10 days.   DERMABOND/SKIN GLUE - you may shower in 24 hours.  The glue will flake off over the next 2-3 weeks. Any sutures or staples will be removed at the office during your follow-up visit.  ACTIVITIES You may resume regular (light) daily activities beginning the next day--such as daily self-care, walking, climbing stairs--gradually increasing activities as tolerated.  You may have sexual intercourse when it is comfortable.  Refrain from any heavy lifting or straining until approved by your doctor. You may drive when you are no longer taking prescription pain medication, you can comfortably wear a seatbelt, and you can safely maneuver your car and apply brakes.  FOLLOW-UP You should see your doctor in the office for a follow-up appointment approximately 2-3 weeks after your surgery.  You should have been given your post-op/follow-up appointment when your surgery was scheduled.  If you did not receive a post-op/follow-up appointment, make sure   that you call for this appointment within a day or two after you arrive home to insure a convenient appointment time.  OTHER INSTRUCTIONS  WHEN TO CALL YOUR DOCTOR: Fever over 101.0 Inability to urinate Continued bleeding from incision. Increased pain, redness, or  drainage from the incision. Increasing abdominal pain  The clinic staff is available to answer your questions during regular business hours.  Please don't hesitate to call and ask to speak to one of the nurses for clinical concerns.  If you have a medical emergency, go to the nearest emergency room or call 911.  A surgeon from [facility] is always on call at the hospital. [address], [address], [place], [place]  [address] ? [address] [address], , [place]   [address] [phone] ? [phone] ? [phone]   

## 2020-11-10 NOTE — TOC Transition Note (Addendum)
Transition of Care (TOC) - CM/SW Discharge Note Marvetta Gibbons RN, BSN Transitions of Care Unit 4E- RN Case Manager See Treatment Team for direct phone #    Patient Details  Name: [name] MRN: [id number] Date of Birth: October 16, 1955  Transition of Care [facility]) CM/SW Contact:  Dawayne Patricia, RN Phone Number: 11/10/2020, 11:54 AM   Clinical Narrative:    Pt stable for transition back to boarding house today, noted order has been placed for home 02. Call made to [facility] with [employer] for home 02 needs. Once processed with insurance [employer] will deliver portable equipment to the room for transport home.  Per Thedore Mins they will be able to service pt at the boarding house address- provided address to [employer] that pt provided-  [place], [place] [address].   Bedside RN to confirm transportation with pt.   1345- charge nurse notified CM that pt requesting 3n1 for home, order placed and [employer] called for DME to be delivered to room- daughter is here to provide transportation- 3n1 provided from [employer] storage and pt signed delivery ticket. [employer] notified and will replace 3n1 that was used in delivery to pt's room.    Final next level of care: Home/Self Care Barriers to Discharge: Barriers Resolved   Patient Goals and CMS Choice    In house provider    Discharge Placement               home        Discharge Plan and Services In-house Referral: Clinical Social Work Discharge Planning Services: CM Consult            DME Arranged: [facility]xygen DME Agency: [employer] Date DME Agency Contacted: 11/10/20 Time DME Agency Contacted: 70 Representative spoke with at DME Agency: [place]: NA [place] Agency: NA        Social Determinants of Health ([place]) Interventions     Readmission Risk Interventions Readmission Risk Prevention Plan 11/10/2020  Transportation Screening Complete  PCP or Specialist Appt within 5-7 Days Complete  Home Care Screening Complete   Medication Review (RN CM) Complete  Some recent data might be hidden

## 2020-11-10 NOTE — Care Management Important Message (Signed)
Important Message  Patient Details  Name: [name] MRN: [id number] Date of Birth: 1955/08/24   Medicare Important Message Given:  Yes     Shelda Altes 11/10/2020, 11:32 AM

## 2020-11-11 ENCOUNTER — Inpatient Hospital Stay (HOSPITAL_COMMUNITY)
Admission: EM | Admit: 2020-11-11 | Discharge: 2020-11-24 | DRG: 186 | Disposition: A | Discharge status: Home Health | Payer: Medicare Other | Attending: Internal Medicine | Admitting: Internal Medicine

## 2020-11-11 ENCOUNTER — Emergency Department (HOSPITAL_COMMUNITY): Payer: Medicare Other

## 2020-11-11 ENCOUNTER — Encounter (HOSPITAL_COMMUNITY): Payer: Self-pay | Admitting: *Deleted

## 2020-11-11 ENCOUNTER — Other Ambulatory Visit: Payer: Self-pay

## 2020-11-11 DIAGNOSIS — Z9889 Other specified postprocedural states: Secondary | ICD-10-CM

## 2020-11-11 DIAGNOSIS — Z87442 Personal history of urinary calculi: Secondary | ICD-10-CM

## 2020-11-11 DIAGNOSIS — Z888 Allergy status to other drugs, medicaments and biological substances status: Secondary | ICD-10-CM

## 2020-11-11 DIAGNOSIS — Z79899 Other long term (current) drug therapy: Secondary | ICD-10-CM

## 2020-11-11 DIAGNOSIS — Z981 Arthrodesis status: Secondary | ICD-10-CM

## 2020-11-11 DIAGNOSIS — Z9049 Acquired absence of other specified parts of digestive tract: Secondary | ICD-10-CM

## 2020-11-11 DIAGNOSIS — Z716 Tobacco abuse counseling: Secondary | ICD-10-CM

## 2020-11-11 DIAGNOSIS — R0609 Other forms of dyspnea: Secondary | ICD-10-CM

## 2020-11-11 DIAGNOSIS — J90 Pleural effusion, not elsewhere classified: Secondary | ICD-10-CM

## 2020-11-11 DIAGNOSIS — Z20822 Contact with and (suspected) exposure to covid-19: Secondary | ICD-10-CM | POA: Diagnosis present

## 2020-11-11 DIAGNOSIS — M069 Rheumatoid arthritis, unspecified: Secondary | ICD-10-CM | POA: Diagnosis present

## 2020-11-11 DIAGNOSIS — R7881 Bacteremia: Secondary | ICD-10-CM | POA: Diagnosis present

## 2020-11-11 DIAGNOSIS — B9562 Methicillin resistant Staphylococcus aureus infection as the cause of diseases classified elsewhere: Secondary | ICD-10-CM | POA: Diagnosis present

## 2020-11-11 DIAGNOSIS — M542 Cervicalgia: Secondary | ICD-10-CM | POA: Diagnosis present

## 2020-11-11 DIAGNOSIS — K5900 Constipation, unspecified: Secondary | ICD-10-CM | POA: Diagnosis present

## 2020-11-11 DIAGNOSIS — G8929 Other chronic pain: Secondary | ICD-10-CM | POA: Diagnosis present

## 2020-11-11 DIAGNOSIS — E222 Syndrome of inappropriate secretion of antidiuretic hormone: Secondary | ICD-10-CM | POA: Diagnosis present

## 2020-11-11 DIAGNOSIS — E785 Hyperlipidemia, unspecified: Secondary | ICD-10-CM | POA: Diagnosis present

## 2020-11-11 DIAGNOSIS — Y95 Nosocomial condition: Secondary | ICD-10-CM | POA: Diagnosis not present

## 2020-11-11 DIAGNOSIS — J189 Pneumonia, unspecified organism: Secondary | ICD-10-CM | POA: Diagnosis not present

## 2020-11-11 DIAGNOSIS — R079 Chest pain, unspecified: Secondary | ICD-10-CM | POA: Diagnosis present

## 2020-11-11 DIAGNOSIS — Z91041 Radiographic dye allergy status: Secondary | ICD-10-CM

## 2020-11-11 DIAGNOSIS — K219 Gastro-esophageal reflux disease without esophagitis: Secondary | ICD-10-CM | POA: Diagnosis present

## 2020-11-11 DIAGNOSIS — D631 Anemia in chronic kidney disease: Secondary | ICD-10-CM | POA: Diagnosis present

## 2020-11-11 DIAGNOSIS — Z8249 Family history of ischemic heart disease and other diseases of the circulatory system: Secondary | ICD-10-CM

## 2020-11-11 DIAGNOSIS — J9601 Acute respiratory failure with hypoxia: Secondary | ICD-10-CM | POA: Diagnosis present

## 2020-11-11 DIAGNOSIS — N183 Chronic kidney disease, stage 3 unspecified: Secondary | ICD-10-CM | POA: Diagnosis present

## 2020-11-11 DIAGNOSIS — E871 Hypo-osmolality and hyponatremia: Secondary | ICD-10-CM | POA: Diagnosis present

## 2020-11-11 DIAGNOSIS — I129 Hypertensive chronic kidney disease with stage 1 through stage 4 chronic kidney disease, or unspecified chronic kidney disease: Secondary | ICD-10-CM | POA: Diagnosis present

## 2020-11-11 DIAGNOSIS — Z825 Family history of asthma and other chronic lower respiratory diseases: Secondary | ICD-10-CM

## 2020-11-11 DIAGNOSIS — F17210 Nicotine dependence, cigarettes, uncomplicated: Secondary | ICD-10-CM | POA: Diagnosis present

## 2020-11-11 DIAGNOSIS — Z90710 Acquired absence of both cervix and uterus: Secondary | ICD-10-CM

## 2020-11-11 DIAGNOSIS — Z881 Allergy status to other antibiotic agents status: Secondary | ICD-10-CM

## 2020-11-11 DIAGNOSIS — B9620 Unspecified Escherichia coli [E. coli] as the cause of diseases classified elsewhere: Secondary | ICD-10-CM | POA: Diagnosis present

## 2020-11-11 DIAGNOSIS — R0902 Hypoxemia: Secondary | ICD-10-CM

## 2020-11-11 DIAGNOSIS — K651 Peritoneal abscess: Secondary | ICD-10-CM | POA: Diagnosis present

## 2020-11-11 HISTORY — DX: Pleural effusion, not elsewhere classified: J90

## 2020-11-11 LAB — BODY FLUID CELL COUNT WITH DIFFERENTIAL
Lymphs, Fluid: 23 %
Monocyte-Macrophage-Serous Fluid: 21 % — ABNORMAL LOW (ref 50–90)
Neutrophil Count, Fluid: 56 % — ABNORMAL HIGH (ref 0–25)
Total Nucleated Cell Count, Fluid: 3312 cu mm — ABNORMAL HIGH (ref 0–1000)

## 2020-11-11 LAB — CBC WITH DIFFERENTIAL/PLATELET
Abs Immature Granulocytes: 0.12 10*3/uL — ABNORMAL HIGH (ref 0.00–0.07)
Basophils Absolute: 0 10*3/uL (ref 0.0–0.1)
Basophils Relative: 0 %
Eosinophils Absolute: 0.1 10*3/uL (ref 0.0–0.5)
Eosinophils Relative: 0 %
HCT: 34.4 % — ABNORMAL LOW (ref 36.0–46.0)
Hemoglobin: 11.4 g/dL — ABNORMAL LOW (ref 12.0–15.0)
Immature Granulocytes: 1 %
Lymphocytes Relative: 10 %
Lymphs Abs: 1.5 10*3/uL (ref 0.7–4.0)
MCH: 29.8 pg (ref 26.0–34.0)
MCHC: 33.1 g/dL (ref 30.0–36.0)
MCV: 89.8 fL (ref 80.0–100.0)
Monocytes Absolute: 1 10*3/uL (ref 0.1–1.0)
Monocytes Relative: 7 %
Neutro Abs: 11.8 10*3/uL — ABNORMAL HIGH (ref 1.7–7.7)
Neutrophils Relative %: 82 %
Platelets: 540 10*3/uL — ABNORMAL HIGH (ref 150–400)
RBC: 3.83 MIL/uL — ABNORMAL LOW (ref 3.87–5.11)
RDW: 14.2 % (ref 11.5–15.5)
WBC: 14.5 10*3/uL — ABNORMAL HIGH (ref 4.0–10.5)
nRBC: 0 % (ref 0.0–0.2)

## 2020-11-11 LAB — LIPASE, BLOOD: Lipase: 31 U/L (ref 11–51)

## 2020-11-11 LAB — LACTATE DEHYDROGENASE, PLEURAL OR PERITONEAL FLUID: LD, Fluid: 269 U/L — ABNORMAL HIGH (ref 3–23)

## 2020-11-11 LAB — COMPREHENSIVE METABOLIC PANEL
ALT: 9 U/L (ref 0–44)
AST: 25 U/L (ref 15–41)
Albumin: 2.5 g/dL — ABNORMAL LOW (ref 3.5–5.0)
Alkaline Phosphatase: 100 U/L (ref 38–126)
Anion gap: 9 (ref 5–15)
BUN: 7 mg/dL — ABNORMAL LOW (ref 8–23)
CO2: 24 mmol/L (ref 22–32)
Calcium: 7.9 mg/dL — ABNORMAL LOW (ref 8.9–10.3)
Chloride: 97 mmol/L — ABNORMAL LOW (ref 98–111)
Creatinine, Ser: 0.78 mg/dL (ref 0.44–1.00)
GFR, Estimated: >60 mL/min (ref >60)
Glucose, Bld: 105 mg/dL — ABNORMAL HIGH (ref 70–99)
Potassium: 4.5 mmol/L (ref 3.5–5.1)
Sodium: 130 mmol/L — ABNORMAL LOW (ref 135–145)
Total Bilirubin: 1.2 mg/dL (ref 0.3–1.2)
Total Protein: 7 g/dL (ref 6.5–8.1)

## 2020-11-11 LAB — URINALYSIS, ROUTINE W REFLEX MICROSCOPIC
Bilirubin Urine: NEGATIVE
Glucose, UA: NEGATIVE mg/dL
Hgb urine dipstick: NEGATIVE
Ketones, ur: NEGATIVE mg/dL
Nitrite: NEGATIVE
Protein, ur: 30 mg/dL — AB
Specific Gravity, Urine: 1.011 (ref 1.005–1.030)
pH: 6 (ref 5.0–8.0)

## 2020-11-11 LAB — RESP PANEL BY RT-PCR (FLU A&B, COVID) ARPGX2
Influenza A by PCR: NEGATIVE
Influenza B by PCR: NEGATIVE
SARS Coronavirus 2 by RT PCR: NEGATIVE

## 2020-11-11 LAB — TROPONIN I (HIGH SENSITIVITY)
Troponin I (High Sensitivity): 21 ng/L — ABNORMAL HIGH (ref <18)
Troponin I (High Sensitivity): 19 ng/L — ABNORMAL HIGH (ref <18)

## 2020-11-11 LAB — PROTEIN, PLEURAL OR PERITONEAL FLUID: Total protein, fluid: 4.1 g/dL

## 2020-11-11 LAB — LACTIC ACID, PLASMA: Lactic Acid, Venous: 0.8 mmol/L (ref 0.5–1.9)

## 2020-11-11 LAB — D-DIMER, QUANTITATIVE: D-Dimer, Quant: 11.99 ug/mL-FEU — ABNORMAL HIGH (ref 0.00–0.50)

## 2020-11-11 LAB — ALBUMIN, PLEURAL OR PERITONEAL FLUID: Albumin, Fluid: 1.6 g/dL

## 2020-11-11 MED ORDER — HYDROMORPHONE HCL 1 MG/ML IJ SOLN
1.0000 mg | Freq: Once | INTRAMUSCULAR | Status: AC
  Administered 2020-11-11: 1 mg via INTRAVENOUS
  Filled 2020-11-11: qty 1

## 2020-11-11 MED ORDER — FENTANYL CITRATE PF 50 MCG/ML IJ SOSY
50.0000 ug | PREFILLED_SYRINGE | INTRAMUSCULAR | Status: DC | PRN
  Administered 2020-11-11 – 2020-11-19 (×9): 50 ug via INTRAVENOUS
  Filled 2020-11-11 (×9): qty 1

## 2020-11-11 MED ORDER — DOCUSATE SODIUM 100 MG PO CAPS
100.0000 mg | ORAL_CAPSULE | Freq: Two times a day (BID) | ORAL | Status: DC
  Administered 2020-11-11 – 2020-11-24 (×27): 100 mg via ORAL
  Filled 2020-11-11 (×26): qty 1

## 2020-11-11 MED ORDER — POLYETHYLENE GLYCOL 3350 17 G PO PACK
17.0000 g | PACK | Freq: Every day | ORAL | Status: DC
  Administered 2020-11-11 – 2020-11-23 (×12): 17 g via ORAL
  Filled 2020-11-11 (×13): qty 1

## 2020-11-11 MED ORDER — TECHNETIUM TO 99M ALBUMIN AGGREGATED
4.3200 | Freq: Once | INTRAVENOUS | Status: AC | PRN
  Administered 2020-11-11: 4.32 via INTRAVENOUS

## 2020-11-11 MED ORDER — OXYCODONE HCL 5 MG PO TABS
5.0000 mg | ORAL_TABLET | ORAL | Status: DC | PRN
  Administered 2020-11-11 – 2020-11-12 (×4): 5 mg via ORAL
  Filled 2020-11-11 (×4): qty 1

## 2020-11-11 MED ORDER — CEFADROXIL 500 MG PO CAPS
1000.0000 mg | ORAL_CAPSULE | Freq: Two times a day (BID) | ORAL | Status: DC
  Administered 2020-11-11 – 2020-11-18 (×14): 1000 mg via ORAL
  Filled 2020-11-11 (×15): qty 2

## 2020-11-11 MED ORDER — METOCLOPRAMIDE HCL 5 MG/ML IJ SOLN
10.0000 mg | Freq: Once | INTRAMUSCULAR | Status: AC
  Administered 2020-11-11: 10 mg via INTRAVENOUS
  Filled 2020-11-11: qty 2

## 2020-11-11 MED ORDER — METRONIDAZOLE 500 MG PO TABS
500.0000 mg | ORAL_TABLET | Freq: Two times a day (BID) | ORAL | Status: DC
  Administered 2020-11-11 – 2020-11-18 (×14): 500 mg via ORAL
  Filled 2020-11-11 (×14): qty 1

## 2020-11-11 MED ORDER — CALCIUM POLYCARBOPHIL 625 MG PO TABS
625.0000 mg | ORAL_TABLET | Freq: Every day | ORAL | Status: DC
  Administered 2020-11-11 – 2020-11-24 (×14): 625 mg via ORAL
  Filled 2020-11-11 (×15): qty 1

## 2020-11-11 MED ORDER — LIDOCAINE HCL 1 % IJ SOLN
INTRAMUSCULAR | Status: AC
  Administered 2020-11-11: 10 mL
  Filled 2020-11-11: qty 20
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
## 2020-11-11 NOTE — ED Triage Notes (Signed)
Pt was brought in by [facility] for pain.  Ems reports that pt had appendectomy and was discharged but could not get oxycodone as "pharmacy did not have it".  EMS reports that spo2 was 88% on RA when they arrived and they placed her on 4L [place] which brought her spo2 to 93%, pt has hx of COPD and has home O2 which she can wear at 3-4L [place] but was not wearing it when ems arrived.

## 2020-11-11 NOTE — ED Notes (Signed)
Pt is currently sleeping
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
## 2020-11-11 NOTE — Procedures (Signed)
Ultrasound-guided diagnostic and therapeutic right thoracentesis performed yielding 1.4 liters of hazy,amber colored fluid. No immediate complications. Follow-up chest x-ray pending. The fluid was submitted to the lab for preordered studies.  EBL < 2 cc.  Due to patient chest discomfort and occasional coughing only the above amount of fluid was removed today.

## 2020-11-11 NOTE — ED Notes (Signed)
Unsuccessful with lab draw not enough blood for test

## 2020-11-11 NOTE — ED Provider Notes (Signed)
Patient signed out to me by previous provider. Please refer to their note for full HPI.  Briefly this is a 65 year old female who was discharged yesterday after a prolonged stay for perforated appendicitis with sepsis requiring surgical repair.  She was reportedly discharged requiring 4 L of nasal cannula.  She returns today with continued abdominal pain, shortness of breath and hypoxia.  Blood work shows a mild leukocytosis, otherwise baseline abnormalities.  We are pending CT of the chest abdomen pelvis and further evaluation. Physical Exam  BP 128/73   Pulse 91   Temp 99.4 F (37.4 C) (Oral)   Resp 16   SpO2 95%   Physical Exam Vitals and nursing note reviewed.  Constitutional:      Appearance: Normal appearance.  HENT:     Head: Normocephalic.     Mouth/Throat:     Mouth: Mucous membranes are moist.  Cardiovascular:     Rate and Rhythm: Normal rate.  Pulmonary:     Comments: At times tachypneic, decreased breath sounds in the right lower lobe, on 4 L nasal cannula, at times tachypneic but maintaining normal oxygenation Abdominal:     Palpations: Abdomen is soft.     Tenderness: There is generalized abdominal tenderness. There is no guarding or rebound.  Skin:    General: Skin is warm.  Neurological:     Mental Status: She is alert and oriented to person, place, and time. Mental status is at baseline.  Psychiatric:        Mood and Affect: Mood normal.    ED Course/Procedures     .Critical Care Performed by: Lorelle Gibbs, DO Authorized by: Lorelle Gibbs, DO   Critical care provider statement:    Critical care time (minutes):  35   Critical care time was exclusive of:  Separately billable procedures and treating other patients   Critical care was necessary to treat or prevent imminent or life-threatening deterioration of the following conditions:  Respiratory failure   Critical care was time spent personally by me on the following activities:  Re-evaluation of  patient's condition, pulse oximetry, review of old charts, evaluation of patient's response to treatment, discussions with consultants and ordering and review of radiographic studies   Care discussed with: admitting provider    MDM   CAT scan shows continued right lower quadrant findings that seem appropriate with postsurgical changes.  No focal fluid collection or abscess.  CT identifies worsening pleural effusion on the right, most likely explaining her shortness of breath/hypoxia.  Of note the D-dimer was elevated when there was an original thought to rule out pulmonary embolism.  I believe the pleural effusion explains her symptoms/hypoxia however this is still a consideration.  Otherwise abdomen and blood work seems stable.  Plan for medical admission for further evaluation and treatment.  Dr. Marylyn Ishihara is excepting.  Patients evaluation and results requires admission for further treatment and care. Patient agrees with admission plan, offers no new complaints and is stable/unchanged at time of admit.       Lorelle Gibbs, DO 11/11/20 1041

## 2020-11-11 NOTE — ED Notes (Signed)
Rounded on pt. Pt currently denies any complaints and did not need any further assistance at this time.  ?

## 2020-11-11 NOTE — Progress Notes (Signed)
Progress Note     Subjective: Patient was discharged from [facility] yesterday 10/31. Called EMS late yesterday evening for abdominal pain and nausea, this pain was reportedly stable from when she was in the hospital. She reports she has not been having bowel movements but she is passing flatus. She reports that she is really hungry. She had been unable to fill Rx for oxycodone. On arrival, EMS found her to be hypoxic and she also complained of SOB and R sided chest pain.   Patient underwent CT scan in the ED that appears stable from an abdominal standpoint but showed worsening R pleural effusion. She had a VQ scan that was negative for PE. She just underwent R sided thoracentesis and is still having some discomfort but reports breathing feels better than previously.   Objective: Vital signs in last 24 hours: Temp:  [97.7 F (36.5 C)-99.4 F (37.4 C)] 99.4 F (37.4 C) (11/01 0645) Pulse Rate:  [86-96] 95 (11/01 1100) Resp:  [15-21] 16 (11/01 1000) BP: (128-145)/(70-81) 141/70 (11/01 1100) SpO2:  [90 %-97 %] 97 % (11/01 1100)    Intake/Output from previous day: No intake/output data recorded. Intake/Output this shift: No intake/output data recorded.  PE: Gen:  Alert, NAD Pulm: rate and effort normal Abd: Soft, ND, mild RLQ TTP without peritonitis, +BS, lap incisions cdi, drain with minimal serosanguinous fluid in bulb   Lab Results:  Recent Labs    11/11/20 0549  WBC 14.5*  HGB 11.4*  HCT 34.4*  PLT 540*   BMET Recent Labs    11/09/20 0425 11/11/20 0549  NA 131* 130*  K 4.5 4.5  CL 99 97*  CO2 22 24  GLUCOSE 92 105*  BUN <5* 7*  CREATININE 0.81 0.78  CALCIUM 8.3* 7.9*   PT/INR No results for input(s): LABPROT, INR in the last 72 hours. CMP     Component Value Date/Time   NA 130 (L) 11/11/2020 0549   NA 141 06/06/2015 0000   K 4.5 11/11/2020 0549   K 4.7 06/13/2015 0000   CL 97 (L) 11/11/2020 0549   CO2 24 11/11/2020 0549   GLUCOSE 105 (H)  11/11/2020 0549   BUN 7 (L) 11/11/2020 0549   BUN 8 06/06/2015 0000   CREATININE 0.78 11/11/2020 0549   CALCIUM 7.9 (L) 11/11/2020 0549   PROT 7.0 11/11/2020 0549   ALBUMIN 2.5 (L) 11/11/2020 0549   AST 25 11/11/2020 0549   ALT 9 11/11/2020 0549   ALKPHOS 100 11/11/2020 0549   BILITOT 1.2 11/11/2020 0549   GFRNONAA >60 11/11/2020 0549   GFRAA >60 06/28/2018 0535   Lipase     Component Value Date/Time   LIPASE 31 11/11/2020 0549       Studies/Results: CT ABDOMEN PELVIS WO CONTRAST  Result Date: 11/11/2020 CLINICAL DATA:  Acute generalized abdominal pain. EXAM: CT ABDOMEN AND PELVIS WITHOUT CONTRAST TECHNIQUE: Multidetector CT imaging of the abdomen and pelvis was performed following the standard protocol without IV contrast. COMPARISON:  November 07, 2020. FINDINGS: Lower chest: Large right pleural effusion is noted with associated atelectasis of the right lower lobe and right middle lobe. Minimal left pleural effusion is noted. Hepatobiliary: No focal liver abnormality is seen. No gallstones, gallbladder wall thickening, or biliary dilatation. Pancreas: Unremarkable. No pancreatic ductal dilatation or surrounding inflammatory changes. Spleen: Normal in size without focal abnormality. Adrenals/Urinary Tract: Adrenal glands are unremarkable. Kidneys are normal, without renal calculi, focal lesion, or hydronephrosis. Bladder is unremarkable. Stomach/Bowel: The stomach appears  normal. There is no evidence of bowel obstruction. Status post appendectomy. Expected postsurgical changes are noted in the right lower quadrant. There is again noted a surgical drain which enters the left side of the abdomen with distal tip in the right upper quadrant adjacent to the right hepatic lobe. Vascular/Lymphatic: Aortic atherosclerosis. No enlarged abdominal or pelvic lymph nodes. Reproductive: Status post hysterectomy. No adnexal masses. Other: Minimal amount of free fluid is noted around the liver. No definite  hernia is noted. Grossly stable ill-defined fluid and phlegmonous density seen in right lower quadrant measuring 2.6 x 1.7 cm currently. Musculoskeletal: No acute or significant osseous findings. IMPRESSION: Large right pleural effusion is noted which is increased compared to prior exam, with associated atelectasis of the right lower lobe and right middle lobe. Minimal left pleural effusion is noted. Status post appendectomy with expected postsurgical changes seen in the right lower quadrant. Stable size and appearance of ill-defined fluid and phlegmonous density seen in right lower quadrant. Stable position of surgical drain is noted with tip adjacent to the right hepatic lobe, with small amount of free fluid noted around the liver. Aortic Atherosclerosis (ICD10-I70.0). Electronically Signed   By: Marijo Conception M.D.   On: 11/11/2020 08:06   NM Pulmonary Perfusion  Result Date: 11/11/2020 CLINICAL DATA:  Hypoxia and COPD. Shortness of breath and right-sided chest pain. Chronic kidney disease. EXAM: NUCLEAR MEDICINE PERFUSION LUNG SCAN TECHNIQUE: Perfusion images were obtained in multiple projections after intravenous injection of radiopharmaceutical. Ventilation scans intentionally deferred if perfusion scan and chest x-ray adequate for interpretation during COVID 19 epidemic. RADIOPHARMACEUTICALS:  4.32 mCi Tc-65m MAA IV COMPARISON:  Chest radiograph 11/11/2020 and CT scans from 11/11/2020 and 11/07/2020 FINDINGS: The patient has a known large right and small left pleural effusion. The right pleural effusion is associated with complete atelectasis of the right lower lobe and right middle lobe on recent CT. Perfusion defects Cleora Fleet the pleural effusions are present. There is no wedge-shaped perfusion defect identified in the aerated lung to indicate the presence of pulmonary embolus. IMPRESSION: 1. No wedge-shaped perfusion defect identified in the aerated lung to indicate the presence of pulmonary embolus.  This corresponds to a PISAPED criteria "pulmonary embolism absent." 2. Perfusion defects corresponding to regions of large right and small left pleural effusion noted. Electronically Signed   By: Van Clines M.D.   On: 11/11/2020 11:41   DG Chest Portable 1 View  Result Date: 11/11/2020 CLINICAL DATA:  Shortness of breath chest pain EXAM: PORTABLE CHEST 1 VIEW COMPARISON:  Chest x-ray 06/12/2020, CT chest 11/07/2020 FINDINGS: Heart is enlarged. Mediastinum appears stable. Calcified plaques in the aortic arch. Pulmonary vasculature appears within normal limits. Persistent large right pleural effusion with associated atelectasis. No significant effusion visualized on the left. No pneumothorax identified. IMPRESSION: 1. Large right pleural effusion with associated atelectasis. 2. Cardiomegaly. Electronically Signed   By: Ofilia Neas M.D.   On: 11/11/2020 08:10    Anti-infectives: Anti-infectives (From admission, onward)    None        Assessment/Plan Perforated appendicitis with feculent and purulent peritonitis  POD#8 s/p Laparoscopic lysis of adhesions x30 minutes, laparoscopic appendectomy, washout and drain placement 10/24 Dr. Kae Heller - PO cefadroxil and flagyl were prescribed at discharge. She does have an E coli bacteremia - abx per primary/ID for this  - tolerating diet and having bowel function - CT 10/28 with no definite abscess  - CT today with stable fluid and phlegmon but no discrete abscess -  continue JP drain and monitor output - currently serosanguinous. Can likely remove prior to discharge if output remains low  - mobilize, continue PT - no follow up recommended - no indication for acute surgical intervention   ID - would recommend resumption of PO cefadroxil and flagyl unless IV abx indicated otherwise  FEN - ok for regular diet from a surgical standpoint, recommend bowel regimen VTE - SCDs, ok for lovenox from a surgical standpoint Foley - none   E coli  bacteremia COPD Hypoxia - no PE noted on VQ scan, L right pleural effusion noted increased from previous, thoracentesis planned  Rheumatoid arthritis  HTN HLD GERD CKD  Anemia  Tobacco abuse Cocaine use  LOS: 0 days    Norm Parcel, [facility] 11/11/2020, 12:02 PM Please see Amion for pager number during day hours 7:00am-4:30pm

## 2020-11-12 DIAGNOSIS — I129 Hypertensive chronic kidney disease with stage 1 through stage 4 chronic kidney disease, or unspecified chronic kidney disease: Secondary | ICD-10-CM | POA: Diagnosis present

## 2020-11-12 DIAGNOSIS — Y95 Nosocomial condition: Secondary | ICD-10-CM | POA: Diagnosis not present

## 2020-11-12 DIAGNOSIS — N183 Chronic kidney disease, stage 3 unspecified: Secondary | ICD-10-CM | POA: Diagnosis present

## 2020-11-12 DIAGNOSIS — J432 Centrilobular emphysema: Secondary | ICD-10-CM | POA: Diagnosis not present

## 2020-11-12 DIAGNOSIS — R0902 Hypoxemia: Secondary | ICD-10-CM

## 2020-11-12 DIAGNOSIS — Z888 Allergy status to other drugs, medicaments and biological substances status: Secondary | ICD-10-CM | POA: Diagnosis not present

## 2020-11-12 DIAGNOSIS — R0609 Other forms of dyspnea: Secondary | ICD-10-CM | POA: Diagnosis present

## 2020-11-12 DIAGNOSIS — J90 Pleural effusion, not elsewhere classified: Secondary | ICD-10-CM | POA: Diagnosis present

## 2020-11-12 DIAGNOSIS — D631 Anemia in chronic kidney disease: Secondary | ICD-10-CM | POA: Diagnosis present

## 2020-11-12 DIAGNOSIS — Z90710 Acquired absence of both cervix and uterus: Secondary | ICD-10-CM | POA: Diagnosis not present

## 2020-11-12 DIAGNOSIS — Z87442 Personal history of urinary calculi: Secondary | ICD-10-CM | POA: Diagnosis not present

## 2020-11-12 DIAGNOSIS — J189 Pneumonia, unspecified organism: Secondary | ICD-10-CM | POA: Diagnosis not present

## 2020-11-12 DIAGNOSIS — Z881 Allergy status to other antibiotic agents status: Secondary | ICD-10-CM | POA: Diagnosis not present

## 2020-11-12 DIAGNOSIS — R0781 Pleurodynia: Secondary | ICD-10-CM | POA: Diagnosis not present

## 2020-11-12 DIAGNOSIS — Z20822 Contact with and (suspected) exposure to covid-19: Secondary | ICD-10-CM | POA: Diagnosis present

## 2020-11-12 DIAGNOSIS — E785 Hyperlipidemia, unspecified: Secondary | ICD-10-CM | POA: Diagnosis present

## 2020-11-12 DIAGNOSIS — G8929 Other chronic pain: Secondary | ICD-10-CM | POA: Diagnosis present

## 2020-11-12 DIAGNOSIS — M542 Cervicalgia: Secondary | ICD-10-CM | POA: Diagnosis present

## 2020-11-12 DIAGNOSIS — Z9049 Acquired absence of other specified parts of digestive tract: Secondary | ICD-10-CM | POA: Diagnosis not present

## 2020-11-12 DIAGNOSIS — E871 Hypo-osmolality and hyponatremia: Secondary | ICD-10-CM | POA: Diagnosis present

## 2020-11-12 DIAGNOSIS — E222 Syndrome of inappropriate secretion of antidiuretic hormone: Secondary | ICD-10-CM | POA: Diagnosis present

## 2020-11-12 DIAGNOSIS — K651 Peritoneal abscess: Secondary | ICD-10-CM | POA: Diagnosis present

## 2020-11-12 DIAGNOSIS — F17210 Nicotine dependence, cigarettes, uncomplicated: Secondary | ICD-10-CM | POA: Diagnosis present

## 2020-11-12 DIAGNOSIS — Z91041 Radiographic dye allergy status: Secondary | ICD-10-CM | POA: Diagnosis not present

## 2020-11-12 DIAGNOSIS — M069 Rheumatoid arthritis, unspecified: Secondary | ICD-10-CM | POA: Diagnosis present

## 2020-11-12 DIAGNOSIS — R7881 Bacteremia: Secondary | ICD-10-CM | POA: Diagnosis present

## 2020-11-12 DIAGNOSIS — Z981 Arthrodesis status: Secondary | ICD-10-CM | POA: Diagnosis not present

## 2020-11-12 DIAGNOSIS — R079 Chest pain, unspecified: Secondary | ICD-10-CM | POA: Diagnosis not present

## 2020-11-12 DIAGNOSIS — J9601 Acute respiratory failure with hypoxia: Secondary | ICD-10-CM | POA: Diagnosis present

## 2020-11-12 DIAGNOSIS — Z9889 Other specified postprocedural states: Secondary | ICD-10-CM | POA: Diagnosis not present

## 2020-11-12 DIAGNOSIS — K219 Gastro-esophageal reflux disease without esophagitis: Secondary | ICD-10-CM | POA: Diagnosis present

## 2020-11-12 LAB — COMPREHENSIVE METABOLIC PANEL
ALT: 7 U/L (ref 0–44)
AST: 21 U/L (ref 15–41)
Albumin: 2 g/dL — ABNORMAL LOW (ref 3.5–5.0)
Alkaline Phosphatase: 84 U/L (ref 38–126)
Anion gap: 4 — ABNORMAL LOW (ref 5–15)
BUN: 7 mg/dL — ABNORMAL LOW (ref 8–23)
CO2: 28 mmol/L (ref 22–32)
Calcium: 7.8 mg/dL — ABNORMAL LOW (ref 8.9–10.3)
Chloride: 98 mmol/L (ref 98–111)
Creatinine, Ser: 0.74 mg/dL (ref 0.44–1.00)
GFR, Estimated: >60 mL/min (ref >60)
Glucose, Bld: 104 mg/dL — ABNORMAL HIGH (ref 70–99)
Potassium: 4.1 mmol/L (ref 3.5–5.1)
Sodium: 130 mmol/L — ABNORMAL LOW (ref 135–145)
Total Bilirubin: 0.5 mg/dL (ref 0.3–1.2)
Total Protein: 6.3 g/dL — ABNORMAL LOW (ref 6.5–8.1)

## 2020-11-12 LAB — CBC
HCT: 32.9 % — ABNORMAL LOW (ref 36.0–46.0)
Hemoglobin: 10.8 g/dL — ABNORMAL LOW (ref 12.0–15.0)
MCH: 29.6 pg (ref 26.0–34.0)
MCHC: 32.8 g/dL (ref 30.0–36.0)
MCV: 90.1 fL (ref 80.0–100.0)
Platelets: 589 10*3/uL — ABNORMAL HIGH (ref 150–400)
RBC: 3.65 MIL/uL — ABNORMAL LOW (ref 3.87–5.11)
RDW: 14.2 % (ref 11.5–15.5)
WBC: 11.4 10*3/uL — ABNORMAL HIGH (ref 4.0–10.5)
nRBC: 0 % (ref 0.0–0.2)

## 2020-11-12 LAB — OSMOLALITY: Osmolality: 272 mOsm/kg — ABNORMAL LOW (ref 275–295)

## 2020-11-12 LAB — CYTOLOGY - NON PAP

## 2020-11-12 LAB — CD19 AND CD20, FLOW CYTOMETRY

## 2020-11-12 LAB — LACTATE DEHYDROGENASE: LDH: 112 U/L (ref 98–192)

## 2020-11-12 MED ORDER — DIPHENHYDRAMINE HCL 25 MG PO CAPS
25.0000 mg | ORAL_CAPSULE | Freq: Four times a day (QID) | ORAL | Status: DC | PRN
  Administered 2020-11-15 – 2020-11-22 (×4): 25 mg via ORAL
  Filled 2020-11-12 (×4): qty 1

## 2020-11-12 MED ORDER — PANTOPRAZOLE SODIUM 40 MG PO TBEC
40.0000 mg | DELAYED_RELEASE_TABLET | Freq: Every morning | ORAL | Status: DC
  Administered 2020-11-12 – 2020-11-24 (×13): 40 mg via ORAL
  Filled 2020-11-12 (×12): qty 1

## 2020-11-12 MED ORDER — AMLODIPINE BESYLATE 10 MG PO TABS
10.0000 mg | ORAL_TABLET | Freq: Every morning | ORAL | Status: DC
  Administered 2020-11-12 – 2020-11-24 (×13): 10 mg via ORAL
  Filled 2020-11-12 (×13): qty 1

## 2020-11-12 MED ORDER — GABAPENTIN 100 MG PO CAPS
100.0000 mg | ORAL_CAPSULE | Freq: Two times a day (BID) | ORAL | Status: DC
  Administered 2020-11-12 – 2020-11-24 (×25): 100 mg via ORAL
  Filled 2020-11-12 (×25): qty 1

## 2020-11-12 MED ORDER — ACETAMINOPHEN 650 MG RE SUPP: 650.0000 mg | Freq: Four times a day (QID) | RECTAL | Status: DC | PRN

## 2020-11-12 MED ORDER — METRONIDAZOLE 500 MG PO TABS: 500.0000 mg | ORAL_TABLET | Freq: Three times a day (TID) | ORAL | Status: DC

## 2020-11-12 MED ORDER — LINACLOTIDE 145 MCG PO CAPS
145.0000 ug | ORAL_CAPSULE | Freq: Every day | ORAL | Status: DC
  Administered 2020-11-13 – 2020-11-24 (×12): 145 ug via ORAL
  Filled 2020-11-12 (×12): qty 1

## 2020-11-12 MED ORDER — ALBUTEROL SULFATE (2.5 MG/3ML) 0.083% IN NEBU: 2.5000 mg | INHALATION_SOLUTION | RESPIRATORY_TRACT | Status: DC | PRN

## 2020-11-12 MED ORDER — OXYCODONE HCL 5 MG PO TABS
5.0000 mg | ORAL_TABLET | ORAL | Status: DC | PRN
  Administered 2020-11-12 – 2020-11-13 (×3): 10 mg via ORAL
  Filled 2020-11-12: qty 1
  Filled 2020-11-12 (×2): qty 2
  Filled 2020-11-12: qty 1

## 2020-11-12 MED ORDER — ACETAMINOPHEN 325 MG PO TABS
650.0000 mg | ORAL_TABLET | Freq: Four times a day (QID) | ORAL | Status: DC | PRN
  Administered 2020-11-14 – 2020-11-17 (×5): 650 mg via ORAL
  Filled 2020-11-12 (×6): qty 2

## 2020-11-12 MED ORDER — ENOXAPARIN SODIUM 40 MG/0.4ML IJ SOSY
40.0000 mg | PREFILLED_SYRINGE | INTRAMUSCULAR | Status: DC
Start: 2020-11-12 — End: 2020-11-21
  Administered 2020-11-12 – 2020-11-21 (×10): 40 mg via SUBCUTANEOUS
  Filled 2020-11-12 (×10): qty 0.4

## 2020-11-12 MED ORDER — MOMETASONE FURO-FORMOTEROL FUM 200-5 MCG/ACT IN AERO
2.0000 | INHALATION_SPRAY | Freq: Two times a day (BID) | RESPIRATORY_TRACT | Status: DC
  Administered 2020-11-12 – 2020-11-18 (×13): 2 via RESPIRATORY_TRACT
  Filled 2020-11-12: qty 8.8

## 2020-11-12 MED ORDER — CEFADROXIL 500 MG PO CAPS: 1000.0000 mg | ORAL_CAPSULE | Freq: Two times a day (BID) | ORAL | Status: DC

## 2020-11-12 MED ORDER — SORBITOL 70 % SOLN
30.0000 mL | Freq: Once | Status: AC
  Administered 2020-11-12: 30 mL via ORAL
  Filled 2020-11-12: qty 30

## 2020-11-12 MED ORDER — ATORVASTATIN CALCIUM 40 MG PO TABS
40.0000 mg | ORAL_TABLET | Freq: Every day | ORAL | Status: DC
  Administered 2020-11-12 – 2020-11-23 (×12): 40 mg via ORAL
  Filled 2020-11-12 (×13): qty 1

## 2020-11-12 NOTE — Progress Notes (Signed)
PROGRESS NOTE    [name]  TDD:[id number] DOB: [birth year] DOA: 11/11/2020 PCP: Nolene Ebbs, MD    Brief Narrative:  65 y.o. female with medical history significant of HTN, HLD, GERD, recent perforated appendicitis s/p lap appy and drain placement. Presenting with abdominal pain and shortness of breath. She was discharged yesterday from [facility] after presenting with a perforated appendix and e coli bacteremia. She had a lap appy performed. A drain was placed after washout. She was started on abx for e coli. She improved and was discharged home on an abx regimen to continue for an additional 8 days. At the time she was hypoxic on 4L [place]. She was discharged to home on O2 and IS. She reports last night she was short of breath and she felt pain all over. Work up demonstrated large pleural effusion, now s/p thoracentesis yielding exudative effusion  Assessment & Plan:   Active Problems:   Dyspnea on exertion  Acute hypoxemic respiratory failure secondary to Right pleural effusion     -Patient was recently discharged requiring 4 L nasal cannula at time of discharge, patient is O2 nave  -Patient now status post thoracentesis, fluid analysis reviewed, findings suggestive of exudative effusion,, cultures thus far negative     - continue IS     - d-dimer elevated, but VQ is negative     -Continue antibiotics per ID below   Abdominal pain with recent perforated appendicitis status post laparoscopic appendectomy with washout and drain placement -General surgery following -Surgery recommendations to continue JP drain and monitor output.,  Possible removing of drain prior to discharge if output remains low -Patient is continued on oral cefadroxil and Flagyl   HTN     - continue home regimen as tolerated   GERD     - continue PPI as tolerated   HLD     - continue home regimen   Tobacco abuse     -Cessation done at time of presentation   Hyponatremia     -Sodium remained stable at 130,  essentially unchanged from prior -Repeat basic metabolic panel in the morning -We will check serum osmole's, urine osmole's, urine sodium   DVT prophylaxis: Lovenox subq Code Status: Full Family Communication: Pt in room family not at bedside  Status is: Observation  The patient will require care spanning > 2 midnights and should be moved to inpatient because: severity of illness   Consultants:  ID General Surgery  Procedures:    Antimicrobials: Anti-infectives (From admission, onward)    Start     Dose/Rate Route Frequency Ordered Stop   11/12/20 1000  cefadroxil (DURICEF) capsule 1,000 mg  Status:  Discontinued        1,000 mg Oral 2 times daily 11/12/20 0823 11/12/20 0830   11/12/20 0915  metroNIDAZOLE (FLAGYL) tablet 500 mg  Status:  Discontinued        500 mg Oral Every 8 hours 11/12/20 0823 11/12/20 0832   11/11/20 2200  cefadroxil (DURICEF) capsule 1,000 mg        1,000 mg Oral 2 times daily 11/11/20 1626     11/11/20 2200  metroNIDAZOLE (FLAGYL) tablet 500 mg        500 mg Oral Every 12 hours 11/11/20 1626         Subjective: Complaining of continued abd pain  Objective: Vitals:   11/12/20 0545 11/12/20 1026 11/12/20 1059 11/12/20 1144  BP: 120/63 120/61 107/63   Pulse: 79  84   Resp: 18  16  Temp: 98.2 F (36.8 C)  98.6 F (37 C)   TempSrc: Oral  Oral   SpO2: 99%  96% 98%  Weight:      Height:        Intake/Output Summary (Last 24 hours) at 11/12/2020 1608 Last data filed at 11/12/2020 0900 Gross per 24 hour  Intake 720 ml  Output --  Net 720 ml   Filed Weights   11/11/20 2100  Weight: 67.4 kg    Examination: General exam: Awake, laying in bed, in nad Respiratory system: Normal respiratory effort, no wheezing Cardiovascular system: regular rate, s1, s2 Gastrointestinal system: Soft, nondistended, positive BS Central nervous system: CN2-12 grossly intact, strength intact Extremities: Perfused, no clubbing Skin: Normal skin turgor, no  notable skin lesions seen Psychiatry: Mood normal // no visual hallucinations   Data Reviewed: I have personally reviewed following labs and imaging studies  CBC: Recent Labs  Lab 11/06/20 0125 11/07/20 0202 11/08/20 0030 11/11/20 0549 11/12/20 0729  WBC 10.0 8.7 8.6 14.5* 11.4*  NEUTROABS 7.3 5.0 5.2 11.8*  --   HGB 13.5 11.3* 11.5* 11.4* 10.8*  HCT 40.0 33.3* 34.8* 34.4* 32.9*  MCV 87.9 87.2 88.3 89.8 90.1  PLT 268 277 332 540* 081*   Basic Metabolic Panel: Recent Labs  Lab 11/06/20 0125 11/08/20 0030 11/09/20 0425 11/11/20 0549 11/12/20 0729  NA 133* 130* 131* 130* 130*  K 4.7 4.3 4.5 4.5 4.1  CL 103 97* 99 97* 98  CO2 21* 27 22 24 28   GLUCOSE 78 106* 92 105* 104*  BUN 10 6* <5* 7* 7*  CREATININE 0.95 0.86 0.81 0.78 0.74  CALCIUM 8.4* 8.1* 8.3* 7.9* 7.8*  MG 1.6*  --   --   --   --    GFR: Estimated Creatinine Clearance: 64.8 mL/min (by C-G formula based on SCr of 0.74 mg/dL). Liver Function Tests: Recent Labs  Lab 11/11/20 0549 11/12/20 0729  AST 25 21  ALT 9 7  ALKPHOS 100 84  BILITOT 1.2 0.5  PROT 7.0 6.3*  ALBUMIN 2.5* 2.0*   Recent Labs  Lab 11/11/20 0549  LIPASE 31   No results for input(s): AMMONIA in the last 168 hours. Coagulation Profile: No results for input(s): INR, PROTIME in the last 168 hours. Cardiac Enzymes: No results for input(s): CKTOTAL, CKMB, CKMBINDEX, TROPONINI in the last 168 hours. BNP (last 3 results) No results for input(s): PROBNP in the last 8760 hours. HbA1C: No results for input(s): HGBA1C in the last 72 hours. CBG: No results for input(s): GLUCAP in the last 168 hours. Lipid Profile: No results for input(s): CHOL, HDL, LDLCALC, TRIG, CHOLHDL, LDLDIRECT in the last 72 hours. Thyroid Function Tests: No results for input(s): TSH, T4TOTAL, FREET4, T3FREE, THYROIDAB in the last 72 hours. Anemia Panel: No results for input(s): VITAMINB12, FOLATE, FERRITIN, TIBC, IRON, RETICCTPCT in the last 72 hours. Sepsis  Labs: Recent Labs  Lab 11/11/20 0549  LATICACIDVEN 0.8    Recent Results (from the past 240 hour(s))  Resp Panel by RT-PCR (Flu A&B, Covid) Nasopharyngeal Swab     Status: None   Collection Time: 11/11/20 11:08 AM   Specimen: Nasopharyngeal Swab; Nasopharyngeal(NP) swabs in vial transport medium  Result Value Ref Range Status   SARS Coronavirus 2 by RT PCR NEGATIVE NEGATIVE Final    Comment: (NOTE) SARS-CoV-2 target nucleic acids are NOT DETECTED.  The SARS-CoV-2 RNA is generally detectable in upper respiratory specimens during the acute phase of infection. The lowest concentration of SARS-CoV-2 viral  copies this assay can detect is 138 copies/mL. A negative result does not preclude SARS-Cov-2 infection and should not be used as the sole basis for treatment or other patient management decisions. A negative result may occur with  improper specimen collection/handling, submission of specimen other than nasopharyngeal swab, presence of viral mutation(s) within the areas targeted by this assay, and inadequate number of viral copies(<138 copies/mL). A negative result must be combined with clinical observations, patient history, and epidemiological information. The expected result is Negative.  Fact Sheet for Patients:  EntrepreneurPulse.com.au  Fact Sheet for Healthcare Providers:  IncredibleEmployment.be  This test is no t yet approved or cleared by the [place] FDA and  has been authorized for detection and/or diagnosis of SARS-CoV-2 by FDA under an Emergency Use Authorization (EUA). This EUA will remain  in effect (meaning this test can be used) for the duration of the COVID-19 declaration under Section 564(b)(1) of the Act, 21 U.S.C.section 360bbb-3(b)(1), unless the authorization is terminated  or revoked sooner.       Influenza A by PCR NEGATIVE NEGATIVE Final   Influenza B by PCR NEGATIVE NEGATIVE Final    Comment: (NOTE) The  Xpert Xpress SARS-CoV-2/FLU/RSV plus assay is intended as an aid in the diagnosis of influenza from Nasopharyngeal swab specimens and should not be used as a sole basis for treatment. Nasal washings and aspirates are unacceptable for Xpert Xpress SARS-CoV-2/FLU/RSV testing.  Fact Sheet for Patients: EntrepreneurPulse.com.au  Fact Sheet for Healthcare Providers: IncredibleEmployment.be  This test is not yet approved or cleared by the [place] FDA and has been authorized for detection and/or diagnosis of SARS-CoV-2 by FDA under an Emergency Use Authorization (EUA). This EUA will remain in effect (meaning this test can be used) for the duration of the COVID-19 declaration under Section 564(b)(1) of the Act, 21 U.S.C. section 360bbb-3(b)(1), unless the authorization is terminated or revoked.  Performed at [facility], [place] [address] [place], [place] [address]   Gram stain     Status: None (Preliminary result)   Collection Time: 11/11/20 12:27 PM   Specimen: Pleura  Result Value Ref Range Status   Specimen Description PLEURAL  Final   Special Requests NONE  Final   Gram Stain   Final    CYTOSPIN SMEAR WBC PRESENT,BOTH PMN AND MONONUCLEAR NO ORGANISMS SEEN Performed at [facility], [address] [place], [place] [address]    Report Status PENDING  Incomplete     Radiology Studies: CT ABDOMEN PELVIS WO CONTRAST  Result Date: 11/11/2020 CLINICAL DATA:  Acute generalized abdominal pain. EXAM: CT ABDOMEN AND PELVIS WITHOUT CONTRAST TECHNIQUE: Multidetector CT imaging of the abdomen and pelvis was performed following the standard protocol without IV contrast. COMPARISON:  November 07, 2020. FINDINGS: Lower chest: Large right pleural effusion is noted with associated atelectasis of the right lower lobe and right middle lobe. Minimal left pleural effusion is noted. Hepatobiliary: No focal liver abnormality is seen. No  gallstones, gallbladder wall thickening, or biliary dilatation. Pancreas: Unremarkable. No pancreatic ductal dilatation or surrounding inflammatory changes. Spleen: Normal in size without focal abnormality. Adrenals/Urinary Tract: Adrenal glands are unremarkable. Kidneys are normal, without renal calculi, focal lesion, or hydronephrosis. Bladder is unremarkable. Stomach/Bowel: The stomach appears normal. There is no evidence of bowel obstruction. Status post appendectomy. Expected postsurgical changes are noted in the right lower quadrant. There is again noted a surgical drain which enters the left side of the abdomen with distal tip in the right upper  quadrant adjacent to the right hepatic lobe. Vascular/Lymphatic: Aortic atherosclerosis. No enlarged abdominal or pelvic lymph nodes. Reproductive: Status post hysterectomy. No adnexal masses. Other: Minimal amount of free fluid is noted around the liver. No definite hernia is noted. Grossly stable ill-defined fluid and phlegmonous density seen in right lower quadrant measuring 2.6 x 1.7 cm currently. Musculoskeletal: No acute or significant osseous findings. IMPRESSION: Large right pleural effusion is noted which is increased compared to prior exam, with associated atelectasis of the right lower lobe and right middle lobe. Minimal left pleural effusion is noted. Status post appendectomy with expected postsurgical changes seen in the right lower quadrant. Stable size and appearance of ill-defined fluid and phlegmonous density seen in right lower quadrant. Stable position of surgical drain is noted with tip adjacent to the right hepatic lobe, with small amount of free fluid noted around the liver. Aortic Atherosclerosis (ICD10-I70.0). Electronically Signed   By: Marijo Conception M.D.   On: 11/11/2020 08:06   DG Chest 1 View  Result Date: 11/11/2020 CLINICAL DATA:  Status post right thoracentesis EXAM: CHEST  1 VIEW COMPARISON:  Previous studies including the  examination done earlier today FINDINGS: Transverse diameter of heart is increased. There are no signs of pulmonary edema. There is interval decrease in right pleural effusion. There is no pneumothorax. There is residual increased density in right lower lung fields suggesting residual pleural effusion and underlying atelectasis/pneumonia. Increased density in the medial left lower lung fields suggesting atelectasis. Minimal left pleural effusion is seen. IMPRESSION: There is significant interval decrease in right pleural effusion after thoracentesis. There is no pneumothorax. There is moderate residual right pleural effusion. Minimal left pleural effusion is seen. Infiltrates are seen in both lower lung fields suggesting atelectasis/pneumonia. Electronically Signed   By: Elmer Picker M.D.   On: 11/11/2020 12:41   NM Pulmonary Perfusion  Result Date: 11/11/2020 CLINICAL DATA:  Hypoxia and COPD. Shortness of breath and right-sided chest pain. Chronic kidney disease. EXAM: NUCLEAR MEDICINE PERFUSION LUNG SCAN TECHNIQUE: Perfusion images were obtained in multiple projections after intravenous injection of radiopharmaceutical. Ventilation scans intentionally deferred if perfusion scan and chest x-ray adequate for interpretation during COVID 19 epidemic. RADIOPHARMACEUTICALS:  4.32 mCi Tc-39m MAA IV COMPARISON:  Chest radiograph 11/11/2020 and CT scans from 11/11/2020 and 11/07/2020 FINDINGS: The patient has a known large right and small left pleural effusion. The right pleural effusion is associated with complete atelectasis of the right lower lobe and right middle lobe on recent CT. Perfusion defects Cleora Fleet the pleural effusions are present. There is no wedge-shaped perfusion defect identified in the aerated lung to indicate the presence of pulmonary embolus. IMPRESSION: 1. No wedge-shaped perfusion defect identified in the aerated lung to indicate the presence of pulmonary embolus. This corresponds to a  PISAPED criteria "pulmonary embolism absent." 2. Perfusion defects corresponding to regions of large right and small left pleural effusion noted. Electronically Signed   By: Van Clines M.D.   On: 11/11/2020 11:41   DG Chest Portable 1 View  Result Date: 11/11/2020 CLINICAL DATA:  Shortness of breath chest pain EXAM: PORTABLE CHEST 1 VIEW COMPARISON:  Chest x-ray 06/12/2020, CT chest 11/07/2020 FINDINGS: Heart is enlarged. Mediastinum appears stable. Calcified plaques in the aortic arch. Pulmonary vasculature appears within normal limits. Persistent large right pleural effusion with associated atelectasis. No significant effusion visualized on the left. No pneumothorax identified. IMPRESSION: 1. Large right pleural effusion with associated atelectasis. 2. Cardiomegaly. Electronically Signed   By: Ofilia Neas  M.D.   On: 11/11/2020 08:10   US THORACENTESIS ASP PLEURAL SPACE W/IMG GUIDE  Result Date: 11/11/2020 INDICATION: Patient with recent history of perforated appendicitis with sepsis requiring surgical repair, dyspnea, right greater than left pleural effusions. Request received for diagnostic and therapeutic right thoracentesis. EXAM: ULTRASOUND GUIDED DIAGNOSTIC AND THERAPEUTIC RIGHT THORACENTESIS MEDICATIONS: 10 mL 1% lidocaine COMPLICATIONS: None immediate. PROCEDURE: An ultrasound guided thoracentesis was thoroughly discussed with the patient and questions answered. The benefits, risks, alternatives and complications were also discussed. The patient understands and wishes to proceed with the procedure. Written consent was obtained. Ultrasound was performed to localize and mark an adequate pocket of fluid in the right chest. The area was then prepped and draped in the normal sterile fashion. 1% Lidocaine was used for local anesthesia. Under ultrasound guidance a 6 Fr Safe-T-Centesis catheter was introduced. Thoracentesis was performed. The catheter was removed and a dressing applied.  FINDINGS: A total of approximately 1.4 liters of hazy, amber fluid was removed. Samples were sent to the laboratory as requested by the clinical team. Due to chest discomfort and some coughing only the above amount of fluid was removed today. IMPRESSION: Successful ultrasound guided diagnostic and therapeutic right thoracentesis yielding 1.4 liters of pleural fluid. Read by: Rowe Robert, PA-C Electronically Signed   By: Sandi Mariscal M.D.   On: 11/11/2020 12:42    Scheduled Meds:  amLODipine  10 mg Oral q morning   atorvastatin  40 mg Oral QHS   cefadroxil  1,000 mg Oral BID   docusate sodium  100 mg Oral BID   enoxaparin (LOVENOX) injection  40 mg Subcutaneous Q24H   gabapentin  100 mg Oral BID   [START ON 11/13/2020] linaclotide  145 mcg Oral QAC breakfast   metroNIDAZOLE  500 mg Oral Q12H   mometasone-formoterol  2 puff Inhalation BID   pantoprazole  40 mg Oral q morning   polycarbophil  625 mg Oral Daily   polyethylene glycol  17 g Oral Daily   Continuous Infusions:   LOS: 0 days   Marylu Lund, MD [facility] Pager On Amion  If 7PM-7AM, please contact night-coverage 11/12/2020, 4:08 PM

## 2020-11-12 NOTE — Care Management Obs Status (Signed)
[place] NOTIFICATION   Patient Details  Name: [name] MRN: [id number] Date of Birth: April 22, 1955   Medicare Observation Status Notification Given:  Yes    MahabirJuliann Pulse, RN 11/12/2020, 3:32 PM

## 2020-11-12 NOTE — Progress Notes (Signed)
[place] for Infectious Disease   Reason for visit: Follow up on abdominal abscess  Interval History: thoracentesis results with 3,312 WBCs, culture ngtd.  Gram stain with no organisms seen.   Cefadroxil + metronidazole   Physical Exam: Constitutional:  Vitals:   11/12/20 1059 11/12/20 1144  BP: 107/63   Pulse: 84   Resp: 16   Temp: 98.6 F (37 C)   SpO2: 96% 98%   patient appears in NAD Respiratory: Normal respiratory effort; CTA B Cardiovascular: RRR GI: soft, nt, nd  Review of Systems: Constitutional: negative for fevers and chills Integument/breast: negative for rash  Lab Results  Component Value Date   WBC 11.4 (H) 11/12/2020   HGB 10.8 (L) 11/12/2020   HCT 32.9 (L) 11/12/2020   MCV 90.1 11/12/2020   PLT 589 (H) 11/12/2020    Lab Results  Component Value Date   CREATININE 0.74 11/12/2020   BUN 7 (L) 11/12/2020   NA 130 (L) 11/12/2020   K 4.1 11/12/2020   CL 98 11/12/2020   CO2 28 11/12/2020    Lab Results  Component Value Date   ALT 7 11/12/2020   AST 21 11/12/2020   ALKPHOS 84 11/12/2020     Microbiology: Recent Results (from the past 240 hour(s))  Resp Panel by RT-PCR (Flu A&B, Covid) Nasopharyngeal Swab     Status: None   Collection Time: 11/11/20 11:08 AM   Specimen: Nasopharyngeal Swab; Nasopharyngeal(NP) swabs in vial transport medium  Result Value Ref Range Status   SARS Coronavirus 2 by RT PCR NEGATIVE NEGATIVE Final    Comment: (NOTE) SARS-CoV-2 target nucleic acids are NOT DETECTED.  The SARS-CoV-2 RNA is generally detectable in upper respiratory specimens during the acute phase of infection. The lowest concentration of SARS-CoV-2 viral copies this assay can detect is 138 copies/mL. A negative result does not preclude SARS-Cov-2 infection and should not be used as the sole basis for treatment or other patient management decisions. A negative result may occur with  improper specimen collection/handling, submission of specimen  other than nasopharyngeal swab, presence of viral mutation(s) within the areas targeted by this assay, and inadequate number of viral copies(<138 copies/mL). A negative result must be combined with clinical observations, patient history, and epidemiological information. The expected result is Negative.  Fact Sheet for Patients:  EntrepreneurPulse.com.au  Fact Sheet for Healthcare Providers:  IncredibleEmployment.be  This test is no t yet approved or cleared by the [place] FDA and  has been authorized for detection and/or diagnosis of SARS-CoV-2 by FDA under an Emergency Use Authorization (EUA). This EUA will remain  in effect (meaning this test can be used) for the duration of the COVID-19 declaration under Section 564(b)(1) of the Act, 21 U.S.C.section 360bbb-3(b)(1), unless the authorization is terminated  or revoked sooner.       Influenza A by PCR NEGATIVE NEGATIVE Final   Influenza B by PCR NEGATIVE NEGATIVE Final    Comment: (NOTE) The Xpert Xpress SARS-CoV-2/FLU/RSV plus assay is intended as an aid in the diagnosis of influenza from Nasopharyngeal swab specimens and should not be used as a sole basis for treatment. Nasal washings and aspirates are unacceptable for Xpert Xpress SARS-CoV-2/FLU/RSV testing.  Fact Sheet for Patients: EntrepreneurPulse.com.au  Fact Sheet for Healthcare Providers: IncredibleEmployment.be  This test is not yet approved or cleared by the [place] FDA and has been authorized for detection and/or diagnosis of SARS-CoV-2 by FDA under an Emergency Use Authorization (EUA). This EUA will remain in effect (  meaning this test can be used) for the duration of the COVID-19 declaration under Section 564(b)(1) of the Act, 21 U.S.C. section 360bbb-3(b)(1), unless the authorization is terminated or revoked.  Performed at [facility], [place] [address] [place], [place] [address]   Gram stain     Status: None (Preliminary result)   Collection Time: 11/11/20 12:27 PM   Specimen: Pleura  Result Value Ref Range Status   Specimen Description PLEURAL  Final   Special Requests NONE  Final   Gram Stain   Final    CYTOSPIN SMEAR WBC PRESENT,BOTH PMN AND MONONUCLEAR NO ORGANISMS SEEN Performed at [facility], [address], [place],  [address]    Report Status PENDING  Incomplete    Impression/Plan:  1. Intra abdominal abscess following perforated appendicitis - E coli and Bacteroides with drain in place and on antibiotics as above and will continue.  Plan to remove the drain prior to discharge.    2.  Exudative effusion - right sided and s/p thoracentesis with exudative appearance. Likely from abdominal abscess extension and will continue to watch the cultures for any new/different growth.  Otherwise, continue with antibiotics as above.    3.  Leukocytosis - initially 14.5 and now down to 11.4 with removal of fluid, possible infection.  Will continue to monitor.

## 2020-11-12 NOTE — Progress Notes (Signed)
Progress Note     Subjective: Patient reports abdominal pain is a little better than yesterday. She is still passing some flatus but has not had a BM. She reports her breathing feels better but her back is sore from thoracentesis. She denies nausea and reports she is hungry this AM. Drain has put out almost nothing in last 24 hrs.    Objective: Vital signs in last 24 hours: Temp:  [98.2 F (36.8 C)-98.6 F (37 C)] 98.2 F (36.8 C) (11/02 0545) Pulse Rate:  [79-95] 79 (11/02 0545) Resp:  [15-20] 18 (11/02 0545) BP: (111-141)/(56-78) 120/63 (11/02 0545) SpO2:  [93 %-100 %] 99 % (11/02 0545) Weight:  [67.4 kg] 67.4 kg (11/01 2100)    Intake/Output from previous day: 11/01 0701 - 11/02 0700 In: 480 [P.O.:480] Out: -  Intake/Output this shift: No intake/output data recorded.  PE: Gen:  Alert, NAD Pulm: rate and effort normal Abd: Soft, ND, mild RLQ TTP without peritonitis, +BS, lap incisions cdi, drain with minimal serosanguinous fluid in bulb   Lab Results:  Recent Labs    11/11/20 0549  WBC 14.5*  HGB 11.4*  HCT 34.4*  PLT 540*   BMET Recent Labs    11/11/20 0549  NA 130*  K 4.5  CL 97*  CO2 24  GLUCOSE 105*  BUN 7*  CREATININE 0.78  CALCIUM 7.9*   PT/INR No results for input(s): LABPROT, INR in the last 72 hours. CMP     Component Value Date/Time   NA 130 (L) 11/11/2020 0549   NA 141 06/06/2015 0000   K 4.5 11/11/2020 0549   K 4.7 06/13/2015 0000   CL 97 (L) 11/11/2020 0549   CO2 24 11/11/2020 0549   GLUCOSE 105 (H) 11/11/2020 0549   BUN 7 (L) 11/11/2020 0549   BUN 8 06/06/2015 0000   CREATININE 0.78 11/11/2020 0549   CALCIUM 7.9 (L) 11/11/2020 0549   PROT 7.0 11/11/2020 0549   ALBUMIN 2.5 (L) 11/11/2020 0549   AST 25 11/11/2020 0549   ALT 9 11/11/2020 0549   ALKPHOS 100 11/11/2020 0549   BILITOT 1.2 11/11/2020 0549   GFRNONAA >60 11/11/2020 0549   GFRAA >60 06/28/2018 0535   Lipase     Component Value Date/Time   LIPASE 31  11/11/2020 0549       Studies/Results: CT ABDOMEN PELVIS WO CONTRAST  Result Date: 11/11/2020 CLINICAL DATA:  Acute generalized abdominal pain. EXAM: CT ABDOMEN AND PELVIS WITHOUT CONTRAST TECHNIQUE: Multidetector CT imaging of the abdomen and pelvis was performed following the standard protocol without IV contrast. COMPARISON:  November 07, 2020. FINDINGS: Lower chest: Large right pleural effusion is noted with associated atelectasis of the right lower lobe and right middle lobe. Minimal left pleural effusion is noted. Hepatobiliary: No focal liver abnormality is seen. No gallstones, gallbladder wall thickening, or biliary dilatation. Pancreas: Unremarkable. No pancreatic ductal dilatation or surrounding inflammatory changes. Spleen: Normal in size without focal abnormality. Adrenals/Urinary Tract: Adrenal glands are unremarkable. Kidneys are normal, without renal calculi, focal lesion, or hydronephrosis. Bladder is unremarkable. Stomach/Bowel: The stomach appears normal. There is no evidence of bowel obstruction. Status post appendectomy. Expected postsurgical changes are noted in the right lower quadrant. There is again noted a surgical drain which enters the left side of the abdomen with distal tip in the right upper quadrant adjacent to the right hepatic lobe. Vascular/Lymphatic: Aortic atherosclerosis. No enlarged abdominal or pelvic lymph nodes. Reproductive: Status post hysterectomy. No adnexal masses. Other: Minimal amount  of free fluid is noted around the liver. No definite hernia is noted. Grossly stable ill-defined fluid and phlegmonous density seen in right lower quadrant measuring 2.6 x 1.7 cm currently. Musculoskeletal: No acute or significant osseous findings. IMPRESSION: Large right pleural effusion is noted which is increased compared to prior exam, with associated atelectasis of the right lower lobe and right middle lobe. Minimal left pleural effusion is noted. Status post appendectomy  with expected postsurgical changes seen in the right lower quadrant. Stable size and appearance of ill-defined fluid and phlegmonous density seen in right lower quadrant. Stable position of surgical drain is noted with tip adjacent to the right hepatic lobe, with small amount of free fluid noted around the liver. Aortic Atherosclerosis (ICD10-I70.0). Electronically Signed   By: Marijo Conception M.D.   On: 11/11/2020 08:06   DG Chest 1 View  Result Date: 11/11/2020 CLINICAL DATA:  Status post right thoracentesis EXAM: CHEST  1 VIEW COMPARISON:  Previous studies including the examination done earlier today FINDINGS: Transverse diameter of heart is increased. There are no signs of pulmonary edema. There is interval decrease in right pleural effusion. There is no pneumothorax. There is residual increased density in right lower lung fields suggesting residual pleural effusion and underlying atelectasis/pneumonia. Increased density in the medial left lower lung fields suggesting atelectasis. Minimal left pleural effusion is seen. IMPRESSION: There is significant interval decrease in right pleural effusion after thoracentesis. There is no pneumothorax. There is moderate residual right pleural effusion. Minimal left pleural effusion is seen. Infiltrates are seen in both lower lung fields suggesting atelectasis/pneumonia. Electronically Signed   By: Elmer Picker M.D.   On: 11/11/2020 12:41   NM Pulmonary Perfusion  Result Date: 11/11/2020 CLINICAL DATA:  Hypoxia and COPD. Shortness of breath and right-sided chest pain. Chronic kidney disease. EXAM: NUCLEAR MEDICINE PERFUSION LUNG SCAN TECHNIQUE: Perfusion images were obtained in multiple projections after intravenous injection of radiopharmaceutical. Ventilation scans intentionally deferred if perfusion scan and chest x-ray adequate for interpretation during COVID 19 epidemic. RADIOPHARMACEUTICALS:  4.32 mCi Tc-30m MAA IV COMPARISON:  Chest radiograph  11/11/2020 and CT scans from 11/11/2020 and 11/07/2020 FINDINGS: The patient has a known large right and small left pleural effusion. The right pleural effusion is associated with complete atelectasis of the right lower lobe and right middle lobe on recent CT. Perfusion defects Cleora Fleet the pleural effusions are present. There is no wedge-shaped perfusion defect identified in the aerated lung to indicate the presence of pulmonary embolus. IMPRESSION: 1. No wedge-shaped perfusion defect identified in the aerated lung to indicate the presence of pulmonary embolus. This corresponds to a PISAPED criteria "pulmonary embolism absent." 2. Perfusion defects corresponding to regions of large right and small left pleural effusion noted. Electronically Signed   By: Van Clines M.D.   On: 11/11/2020 11:41   DG Chest Portable 1 View  Result Date: 11/11/2020 CLINICAL DATA:  Shortness of breath chest pain EXAM: PORTABLE CHEST 1 VIEW COMPARISON:  Chest x-ray 06/12/2020, CT chest 11/07/2020 FINDINGS: Heart is enlarged. Mediastinum appears stable. Calcified plaques in the aortic arch. Pulmonary vasculature appears within normal limits. Persistent large right pleural effusion with associated atelectasis. No significant effusion visualized on the left. No pneumothorax identified. IMPRESSION: 1. Large right pleural effusion with associated atelectasis. 2. Cardiomegaly. Electronically Signed   By: Ofilia Neas M.D.   On: 11/11/2020 08:10   US THORACENTESIS ASP PLEURAL SPACE W/IMG GUIDE  Result Date: 11/11/2020 INDICATION: Patient with recent history of perforated appendicitis  with sepsis requiring surgical repair, dyspnea, right greater than left pleural effusions. Request received for diagnostic and therapeutic right thoracentesis. EXAM: ULTRASOUND GUIDED DIAGNOSTIC AND THERAPEUTIC RIGHT THORACENTESIS MEDICATIONS: 10 mL 1% lidocaine COMPLICATIONS: None immediate. PROCEDURE: An ultrasound guided thoracentesis was  thoroughly discussed with the patient and questions answered. The benefits, risks, alternatives and complications were also discussed. The patient understands and wishes to proceed with the procedure. Written consent was obtained. Ultrasound was performed to localize and mark an adequate pocket of fluid in the right chest. The area was then prepped and draped in the normal sterile fashion. 1% Lidocaine was used for local anesthesia. Under ultrasound guidance a 6 Fr Safe-T-Centesis catheter was introduced. Thoracentesis was performed. The catheter was removed and a dressing applied. FINDINGS: A total of approximately 1.4 liters of hazy, amber fluid was removed. Samples were sent to the laboratory as requested by the clinical team. Due to chest discomfort and some coughing only the above amount of fluid was removed today. IMPRESSION: Successful ultrasound guided diagnostic and therapeutic right thoracentesis yielding 1.4 liters of pleural fluid. Read by: Rowe Robert, PA-C Electronically Signed   By: Sandi Mariscal M.D.   On: 11/11/2020 12:42    Anti-infectives: Anti-infectives (From admission, onward)    Start     Dose/Rate Route Frequency Ordered Stop   11/11/20 2200  cefadroxil (DURICEF) capsule 1,000 mg        1,000 mg Oral 2 times daily 11/11/20 1626     11/11/20 2200  metroNIDAZOLE (FLAGYL) tablet 500 mg        500 mg Oral Every 12 hours 11/11/20 1626          Assessment/Plan Perforated appendicitis with feculent and purulent peritonitis  POD#9 s/p Laparoscopic lysis of adhesions x30 minutes, laparoscopic appendectomy, washout and drain placement 10/24 Dr. Kae Heller - PO cefadroxil and flagyl were prescribed at discharge. She does have an E coli bacteremia - abx per primary/ID for this  - tolerating diet, constipated - on miralax, fiber, colace, linzess; will give once dose of PO sorbitol today  - CT 10/28 with no definite abscess  - CT 11/1 with stable fluid and phlegmon but no discrete  abscess - continue JP drain and monitor output - currently serosanguinous. Can likely remove prior to discharge if output remains low  - mobilize, continue PT - no follow up recommended - no indication for acute surgical intervention   ID - PO cefadroxil and flagyl unless IV abx indicated otherwise  FEN - HH diet, bowel regimen VTE - SCDs, ok for lovenox from a surgical standpoint Foley - none   E coli bacteremia COPD Hypoxia - no PE noted on VQ scan, L right pleural effusion noted increased from previous, thoracentesis done yesterday and 1.4 L removed Rheumatoid arthritis  HTN HLD GERD CKD  Anemia  Tobacco abuse Cocaine use  LOS: 0 days    Norm Parcel, [facility] 11/12/2020, 7:25 AM Please see Amion for pager number during day hours 7:00am-4:30pm

## 2020-11-13 DIAGNOSIS — J90 Pleural effusion, not elsewhere classified: Principal | ICD-10-CM

## 2020-11-13 DIAGNOSIS — R0609 Other forms of dyspnea: Secondary | ICD-10-CM | POA: Diagnosis not present

## 2020-11-13 LAB — OSMOLALITY, URINE: Osmolality, Ur: 428 mOsm/kg (ref 300–900)

## 2020-11-13 LAB — RAPID URINE DRUG SCREEN, HOSP PERFORMED
Amphetamines: NOT DETECTED
Barbiturates: NOT DETECTED
Benzodiazepines: NOT DETECTED
Cocaine: POSITIVE — AB
Opiates: POSITIVE — AB
Tetrahydrocannabinol: NOT DETECTED

## 2020-11-13 LAB — SODIUM, URINE, RANDOM: Sodium, Ur: 110 mmol/L

## 2020-11-13 MED ORDER — AMITRIPTYLINE HCL 25 MG PO TABS
50.0000 mg | ORAL_TABLET | Freq: Every day | ORAL | Status: DC
  Administered 2020-11-13 – 2020-11-23 (×11): 50 mg via ORAL
  Filled 2020-11-13 (×11): qty 2

## 2020-11-13 MED ORDER — OXYCODONE HCL 5 MG PO TABS
15.0000 mg | ORAL_TABLET | Freq: Four times a day (QID) | ORAL | Status: DC | PRN
  Administered 2020-11-13 – 2020-11-24 (×32): 15 mg via ORAL
  Filled 2020-11-13 (×34): qty 3

## 2020-11-13 MED ORDER — SENNA 8.6 MG PO TABS
1.0000 | ORAL_TABLET | Freq: Every day | ORAL | Status: DC
  Administered 2020-11-13 – 2020-11-24 (×12): 8.6 mg via ORAL
  Filled 2020-11-13 (×12): qty 1

## 2020-11-13 NOTE — Progress Notes (Signed)
PT Cancellation Note  Patient Details Name: [name] MRN: [id number] DOB: May 23, 1955   Cancelled Treatment:    Reason Eval/Treat Not Completed: Patient declined, no reason specified. Per chart review, pt ambulating 100 ft x3 with rollator prior to last d/c 10/30. Pt states she has been up to the bathroom and walked in the hall with nursing using RW today. Pt is currently rolling around in bed and repositioning to comfort independently, states she doesn't need PT, then tells therapist "you can try again tomorrow". RN confirms pt ambulated with RW and her earlier today. Will attempt eval tomorrow.    Talbot Grumbling PT, DPT 11/13/20, 2:25 PM

## 2020-11-13 NOTE — TOC Initial Note (Signed)
Transition of Care [facility]) - Initial/Assessment Note    Patient Details  Name: [name] MRN: [id number] Date of Birth: Jan 12, 1955  Transition of Care [facility] CM/SW Contact:    Dessa Phi, RN Phone Number: 11/13/2020, 10:37 AM  Clinical Narrative: From home. Already has home 02 but only HS via [employer]-contacted [employer] with dtr concerns.                  Expected Discharge Plan: Home/Self Care Barriers to Discharge: Continued Medical Work up   Patient Goals and CMS Choice Patient states their goals for this hospitalization and ongoing recovery are:: go home CMS Medicare.gov Compare Post Acute Care list provided to:: Patient Represenative (must comment) (rolanda dtr [phone]) Choice offered to / list presented to : Adult Children  Expected Discharge Plan and Services Expected Discharge Plan: Home/Self Care   Discharge Planning Services: CM Consult   Living arrangements for the past 2 months: Single Family Home Expected Discharge Date:  (unknown)                                    Prior Living Arrangements/Services Living arrangements for the past 2 months: Single Family Home Lives with:: Self   Do you feel safe going back to the place where you live?: Yes          Current home services: DME (home 02 HS-dtr unsure of dme company-will need to confirm)    Activities of Daily Living Home Assistive Devices/Equipment: Oxygen ADL Screening (condition at time of admission) Patient's cognitive ability adequate to safely complete daily activities?: Yes Is the patient deaf or have difficulty hearing?: No Does the patient have difficulty seeing, even when wearing glasses/contacts?: No Does the patient have difficulty concentrating, remembering, or making decisions?: No Patient able to express need for assistance with ADLs?: Yes Does the patient have difficulty dressing or bathing?: No Independently performs ADLs?: Yes (appropriate for developmental  age) Does the patient have difficulty walking or climbing stairs?: Yes (secondary to shortness of breath) Weakness of Legs: Both Weakness of Arms/Hands: None  Permission Sought/Granted Permission sought to share information with : Case Manager Permission granted to share information with : Yes, Verbal Permission Granted  Share Information with NAME: Case Manager     Permission granted to share info w Relationship: Rolanda dtr [phone]     Emotional Assessment Appearance:: Appears stated age       Alcohol / Substance Use: Not Applicable    Admission diagnosis:  Dyspnea on exertion [R06.09] Pleural effusion [J90] Hypoxia [R09.02] Status post thoracentesis [Z98.890] Pleural effusion on right [J90] Patient Active Problem List   Diagnosis Date Noted   Pleural effusion 11/12/2020   Dyspnea on exertion 11/11/2020   Perforated appendicitis 11/02/2020   Chronic kidney disease, stage 3a ([place]) 11/02/2020   Colitis 06/23/2018   Coronary artery calcification 02/23/2017   Closed nondisplaced fracture of lateral malleolus of right fibula 10/28/2016   Noninfectious gastroenteritis    Hematochezia 09/01/2016   Acute diarrhea 08/28/2016   Hydronephrosis, right 06/29/2015   Hyperkalemia 06/29/2015   HCAP (healthcare-associated pneumonia) 06/29/2015   Peripheral neuropathy 06/29/2015   Candida infection, oral 06/28/2015   Anemia due to other cause 06/10/2015   Hypoalbuminemia due to protein-calorie malnutrition ([place]) 06/10/2015   Ankle fracture, right 06/03/2015   Severe sepsis ([place]) 05/29/2015   Spinal stenosis in cervical region 05/12/2015   S/P cervical spinal fusion  04/17/2015   Acute cystitis without hematuria    Vomiting    Constipation 02/17/2015   CAP (community acquired pneumonia) 02/15/2015   Encephalopathy, metabolic 67/20/9470   Cocaine abuse with cocaine-induced disorder ([place]) 02/14/2015   Sepsis ([place]) 10/28/2014   SIRS (systemic inflammatory response syndrome)  ([place]) 10/28/2014   Sore throat 10/28/2014   Acute encephalopathy 96/28/3662   Metabolic acidosis    Leukocytosis    Hypotension 09/10/2014   Dehydration 09/10/2014   Chest pain at rest 09/09/2014   Tobacco abuse 09/09/2014   Mixed hyperlipidemia 09/09/2014   COPD (chronic obstructive pulmonary disease) ([place]) 09/09/2014   GERD without esophagitis 09/09/2014   Chest pain 08/20/2014   Nausea & vomiting 08/20/2014   Acute renal failure superimposed on stage 3 chronic kidney disease ([place]) 08/20/2014   Lower urinary tract infectious disease 08/20/2014   Pain in the chest    Pain 02/11/2013   Cocaine abuse ([place]) 02/11/2013   Rheumatoid arthritis flare ([place]) 02/11/2013   Acute renal failure ([place]) 02/11/2013   AKI (acute kidney injury) ([place]) 02/11/2013   Hiatal hernia with gastroesophageal reflux 10/11/2012   Abdominal mass 10/08/2012   Knee pain 10/08/2012   Essential hypertension 10/08/2012   Pancreatic mass 10/07/2012   Abdominal pain 10/07/2012   Rheumatoid arthritis ([place])    PCP:  Nolene Ebbs, MD Pharmacy:   [place], [place] - [address] [place] [address] Phone: [phone] Fax: [phone]  [facility] [address] [place] [address] Phone: [phone] Fax: [phone]     Social Determinants of Health (SDOH) Interventions    Readmission Risk Interventions Readmission Risk Prevention Plan 11/13/2020 11/10/2020  Transportation Screening Complete Complete  PCP or Specialist Appt within 5-7 Days - Complete  PCP or Specialist Appt within 3-5 Days Complete -  Home Care Screening - Complete  Medication Review (RN CM) - Complete  HRI or Home Care Consult Complete -  Social Work Consult for Recovery Care Planning/Counseling Complete -  Palliative Care Screening Complete -  Medication Review Press photographer) Complete -  Some recent data might be hidden

## 2020-11-13 NOTE — Plan of Care (Signed)
  Problem: Education: Goal: Knowledge of General Education information will improve Description: Including pain rating scale, medication(s)/side effects and non-pharmacologic comfort measures 11/13/2020 0239 by Ashley Murrain, RN Outcome: Progressing 11/13/2020 0237 by Ashley Murrain, RN Outcome: Progressing   Problem: Activity: Goal: Risk for activity intolerance will decrease Outcome: Progressing   Problem: Nutrition: Goal: Adequate nutrition will be maintained Outcome: Progressing

## 2020-11-13 NOTE — Progress Notes (Signed)
SATURATION QUALIFICATIONS: (This note is used to comply with regulatory documentation for home oxygen)  Patient Saturations on Room Air at Rest = 92%  Patient Saturations on Room Air while Ambulating = 86%  Patient Saturations on 2 Liters of oxygen while Ambulating = 94%  Please briefly explain why patient needs home oxygen Decreased oxygen saturations with ambulation on room air and increased work of breathing

## 2020-11-13 NOTE — Progress Notes (Signed)
[facility] for Infectious Disease   Reason for visit: Follow up on abdominal abscess  Interval History: no growth to date of the pleural fluid; remains afebrile.  Feels better since admission.   Day 13 total antibiotics  Physical Exam: Constitutional:  Vitals:   11/13/20 1006 11/13/20 1325  BP: (!) 146/74 120/65  Pulse:  87  Resp:  17  Temp:  99.2 F (37.3 C)  SpO2:  90%   patient appears in NAD Respiratory: Normal respiratory effort; CTA B Cardiovascular: RRR GI: soft, nt, nd  Review of Systems: Constitutional: negative for fevers and chills Integument/breast: negative for rash Musculoskeletal: negative for myalgias and arthralgias  Lab Results  Component Value Date   WBC 11.4 (H) 11/12/2020   HGB 10.8 (L) 11/12/2020   HCT 32.9 (L) 11/12/2020   MCV 90.1 11/12/2020   PLT 589 (H) 11/12/2020    Lab Results  Component Value Date   CREATININE 0.74 11/12/2020   BUN 7 (L) 11/12/2020   NA 130 (L) 11/12/2020   K 4.1 11/12/2020   CL 98 11/12/2020   CO2 28 11/12/2020    Lab Results  Component Value Date   ALT 7 11/12/2020   AST 21 11/12/2020   ALKPHOS 84 11/12/2020     Microbiology: Recent Results (from the past 240 hour(s))  Resp Panel by RT-PCR (Flu A&B, Covid) Nasopharyngeal Swab     Status: None   Collection Time: 11/11/20 11:08 AM   Specimen: Nasopharyngeal Swab; Nasopharyngeal(NP) swabs in vial transport medium  Result Value Ref Range Status   SARS Coronavirus 2 by RT PCR NEGATIVE NEGATIVE Final    Comment: (NOTE) SARS-CoV-2 target nucleic acids are NOT DETECTED.  The SARS-CoV-2 RNA is generally detectable in upper respiratory specimens during the acute phase of infection. The lowest concentration of SARS-CoV-2 viral copies this assay can detect is 138 copies/mL. A negative result does not preclude SARS-Cov-2 infection and should not be used as the sole basis for treatment or other patient management decisions. A negative result may occur with   improper specimen collection/handling, submission of specimen other than nasopharyngeal swab, presence of viral mutation(s) within the areas targeted by this assay, and inadequate number of viral copies(<138 copies/mL). A negative result must be combined with clinical observations, patient history, and epidemiological information. The expected result is Negative.  Fact Sheet for Patients:  EntrepreneurPulse.com.au  Fact Sheet for Healthcare Providers:  IncredibleEmployment.be  This test is no t yet approved or cleared by the [place] FDA and  has been authorized for detection and/or diagnosis of SARS-CoV-2 by FDA under an Emergency Use Authorization (EUA). This EUA will remain  in effect (meaning this test can be used) for the duration of the COVID-19 declaration under Section 564(b)(1) of the Act, 21 U.S.C.section 360bbb-3(b)(1), unless the authorization is terminated  or revoked sooner.       Influenza A by PCR NEGATIVE NEGATIVE Final   Influenza B by PCR NEGATIVE NEGATIVE Final    Comment: (NOTE) The Xpert Xpress SARS-CoV-2/FLU/RSV plus assay is intended as an aid in the diagnosis of influenza from Nasopharyngeal swab specimens and should not be used as a sole basis for treatment. Nasal washings and aspirates are unacceptable for Xpert Xpress SARS-CoV-2/FLU/RSV testing.  Fact Sheet for Patients: EntrepreneurPulse.com.au  Fact Sheet for Healthcare Providers: IncredibleEmployment.be  This test is not yet approved or cleared by the [place] FDA and has been authorized for detection and/or diagnosis of SARS-CoV-2 by FDA under an Emergency Use  Authorization (EUA). This EUA will remain in effect (meaning this test can be used) for the duration of the COVID-19 declaration under Section 564(b)(1) of the Act, 21 U.S.C. section 360bbb-3(b)(1), unless the authorization is terminated  or revoked.  Performed at [facility], [place] [address], [place], [place] [address]   Culture, body fluid w Gram Stain-bottle     Status: None (Preliminary result)   Collection Time: 11/11/20 12:27 PM   Specimen: Pleura  Result Value Ref Range Status   Specimen Description PLEURAL  Final   Special Requests NONE  Final   Culture   Final    NO GROWTH 1 DAY Performed at [place] [address], [place], [place] [address]    Report Status PENDING  Incomplete  Gram stain     Status: None (Preliminary result)   Collection Time: 11/11/20 12:27 PM   Specimen: Pleura  Result Value Ref Range Status   Specimen Description PLEURAL  Final   Special Requests NONE  Final   Gram Stain   Final    CYTOSPIN SMEAR WBC PRESENT,BOTH PMN AND MONONUCLEAR NO ORGANISMS SEEN Performed at [facility], [address] [place], [place] [address]    Report Status PENDING  Incomplete    Impression/Plan:  1. Intraabdominal abscess - no new concerns and drain now removed by surgery.  She is having no significant abdominal pain.  She is on cefadroxil and metronidazole and will continue this through 11/10 and then she can stop.    2.  Exudative pleural fluid - no growth on the culture from the thoracentesis and no new symptoms, off of oxygen therapy.  Likely related to #1 and as above, will continue with antibiotics through 11/10 orally and then can stop.    3.  Hypoxia - as above, not now on oxygen and has resolved following thoracentesis.    I will sign off, call with any questions

## 2020-11-13 NOTE — Progress Notes (Signed)
PROGRESS NOTE    [name]  RAQ:[id number] DOB: Apr 13, 1955 DOA: 11/11/2020 PCP: Nolene Ebbs, MD    Brief Narrative:  65 y.o. female with medical history significant of HTN, HLD, GERD, recent perforated appendicitis s/p lap appy and drain placement. Presenting with abdominal pain and shortness of breath. She was discharged yesterday from [facility] after presenting with a perforated appendix and e coli bacteremia. She had a lap appy performed. A drain was placed after washout. She was started on abx for e coli. She improved and was discharged home on an abx regimen to continue for an additional 8 days. At the time she was hypoxic on 4L [place]. She was discharged to home on O2 and IS. She reports last night she was short of breath and she felt pain all over. Work up demonstrated large pleural effusion, now s/p thoracentesis yielding exudative effusion  Assessment & Plan:   Active Problems:   Dyspnea on exertion   Pleural effusion  Acute hypoxemic respiratory failure secondary to Right pleural effusion     -Patient was recently discharged requiring 4 L nasal cannula at time of discharge, patient is O2 nave  -Patient now status post thoracentesis, fluid analysis reviewed, findings suggestive of exudative effusion,, cultures thus far negative     - continue IS     - d-dimer elevated, but VQ is negative     -discussed with ID, recommendation to continue antibiotics below   Abdominal pain with recent perforated appendicitis status post laparoscopic appendectomy with washout and drain placement -General surgery following -Patient is continued on oral cefadroxil and Flagyl -Drain since pulled per general surgery, general surgery has since signed off.   HTN     - continue on home regimen as tolerated   GERD     - continue PPI as tolerated   HLD     - continue home regimen   Tobacco abuse     -Cessation done at time of presentation   Hyponatremia     -Sodium remained stable at 130,  essentially unchanged from prior -sodium stable -Serum osmole's reviewed, low, urine sodium is high at 110.  Suggestive of SIADH -Would place patient on fluid restricted diet -Repeat sodium in the morning   DVT prophylaxis: Lovenox subq Code Status: Full Family Communication: Pt in room family not at bedside  Status is: Inpatient  Remains inpatient secondary to severity of illness   Consultants:  ID General Surgery  Procedures:    Antimicrobials: Anti-infectives (From admission, onward)    Start     Dose/Rate Route Frequency Ordered Stop   11/12/20 1000  cefadroxil (DURICEF) capsule 1,000 mg  Status:  Discontinued        1,000 mg Oral 2 times daily 11/12/20 0823 11/12/20 0830   11/12/20 0915  metroNIDAZOLE (FLAGYL) tablet 500 mg  Status:  Discontinued        500 mg Oral Every 8 hours 11/12/20 0823 11/12/20 0832   11/11/20 2200  cefadroxil (DURICEF) capsule 1,000 mg        1,000 mg Oral 2 times daily 11/11/20 1626     11/11/20 2200  metroNIDAZOLE (FLAGYL) tablet 500 mg        500 mg Oral Every 12 hours 11/11/20 1626         Subjective: Reports back pains today  Objective: Vitals:   11/13/20 0849 11/13/20 0852 11/13/20 1006 11/13/20 1325  BP:   (!) 146/74 120/65  Pulse:    87  Resp:    17  Temp:    99.2 F (37.3 C)  TempSrc:    Oral  SpO2: (!) 88% 91%  90%  Weight:      Height:        Intake/Output Summary (Last 24 hours) at 11/13/2020 1716 Last data filed at 11/13/2020 1046 Gross per 24 hour  Intake 840 ml  Output 5 ml  Net 835 ml    Filed Weights   11/11/20 2100  Weight: 67.4 kg    Examination: General exam: Conversant, in no acute distress Respiratory system: normal chest rise, clear, no audible wheezing Cardiovascular system: regular rhythm, s1-s2 Gastrointestinal system: Nondistended, nontender, pos BS Central nervous system: No seizures, no tremors Extremities: No cyanosis, no joint deformities Skin: No rashes, no pallor Psychiatry: Affect  normal // no auditory hallucinations   Data Reviewed: I have personally reviewed following labs and imaging studies  CBC: Recent Labs  Lab 11/07/20 0202 11/08/20 0030 11/11/20 0549 11/12/20 0729  WBC 8.7 8.6 14.5* 11.4*  NEUTROABS 5.0 5.2 11.8*  --   HGB 11.3* 11.5* 11.4* 10.8*  HCT 33.3* 34.8* 34.4* 32.9*  MCV 87.2 88.3 89.8 90.1  PLT 277 332 540* 589*    Basic Metabolic Panel: Recent Labs  Lab 11/08/20 0030 11/09/20 0425 11/11/20 0549 11/12/20 0729  NA 130* 131* 130* 130*  K 4.3 4.5 4.5 4.1  CL 97* 99 97* 98  CO2 27 22 24 28   GLUCOSE 106* 92 105* 104*  BUN 6* <5* 7* 7*  CREATININE 0.86 0.81 0.78 0.74  CALCIUM 8.1* 8.3* 7.9* 7.8*    GFR: Estimated Creatinine Clearance: 64.8 mL/min (by C-G formula based on SCr of 0.74 mg/dL). Liver Function Tests: Recent Labs  Lab 11/11/20 0549 11/12/20 0729  AST 25 21  ALT 9 7  ALKPHOS 100 84  BILITOT 1.2 0.5  PROT 7.0 6.3*  ALBUMIN 2.5* 2.0*    Recent Labs  Lab 11/11/20 0549  LIPASE 31    No results for input(s): AMMONIA in the last 168 hours. Coagulation Profile: No results for input(s): INR, PROTIME in the last 168 hours. Cardiac Enzymes: No results for input(s): CKTOTAL, CKMB, CKMBINDEX, TROPONINI in the last 168 hours. BNP (last 3 results) No results for input(s): PROBNP in the last 8760 hours. HbA1C: No results for input(s): HGBA1C in the last 72 hours. CBG: No results for input(s): GLUCAP in the last 168 hours. Lipid Profile: No results for input(s): CHOL, HDL, LDLCALC, TRIG, CHOLHDL, LDLDIRECT in the last 72 hours. Thyroid Function Tests: No results for input(s): TSH, T4TOTAL, FREET4, T3FREE, THYROIDAB in the last 72 hours. Anemia Panel: No results for input(s): VITAMINB12, FOLATE, FERRITIN, TIBC, IRON, RETICCTPCT in the last 72 hours. Sepsis Labs: Recent Labs  Lab 11/11/20 0549  LATICACIDVEN 0.8     Recent Results (from the past 240 hour(s))  Resp Panel by RT-PCR (Flu A&B, Covid)  Nasopharyngeal Swab     Status: None   Collection Time: 11/11/20 11:08 AM   Specimen: Nasopharyngeal Swab; Nasopharyngeal(NP) swabs in vial transport medium  Result Value Ref Range Status   SARS Coronavirus 2 by RT PCR NEGATIVE NEGATIVE Final    Comment: (NOTE) SARS-CoV-2 target nucleic acids are NOT DETECTED.  The SARS-CoV-2 RNA is generally detectable in upper respiratory specimens during the acute phase of infection. The lowest concentration of SARS-CoV-2 viral copies this assay can detect is 138 copies/mL. A negative result does not preclude SARS-Cov-2 infection and should not be used as the sole basis for treatment or other patient management  decisions. A negative result may occur with  improper specimen collection/handling, submission of specimen other than nasopharyngeal swab, presence of viral mutation(s) within the areas targeted by this assay, and inadequate number of viral copies(<138 copies/mL). A negative result must be combined with clinical observations, patient history, and epidemiological information. The expected result is Negative.  Fact Sheet for Patients:  EntrepreneurPulse.com.au  Fact Sheet for Healthcare Providers:  IncredibleEmployment.be  This test is no t yet approved or cleared by the [place] FDA and  has been authorized for detection and/or diagnosis of SARS-CoV-2 by FDA under an Emergency Use Authorization (EUA). This EUA will remain  in effect (meaning this test can be used) for the duration of the COVID-19 declaration under Section 564(b)(1) of the Act, 21 U.S.C.section 360bbb-3(b)(1), unless the authorization is terminated  or revoked sooner.       Influenza A by PCR NEGATIVE NEGATIVE Final   Influenza B by PCR NEGATIVE NEGATIVE Final    Comment: (NOTE) The Xpert Xpress SARS-CoV-2/FLU/RSV plus assay is intended as an aid in the diagnosis of influenza from Nasopharyngeal swab specimens and should not be  used as a sole basis for treatment. Nasal washings and aspirates are unacceptable for Xpert Xpress SARS-CoV-2/FLU/RSV testing.  Fact Sheet for Patients: EntrepreneurPulse.com.au  Fact Sheet for Healthcare Providers: IncredibleEmployment.be  This test is not yet approved or cleared by the [place] FDA and has been authorized for detection and/or diagnosis of SARS-CoV-2 by FDA under an Emergency Use Authorization (EUA). This EUA will remain in effect (meaning this test can be used) for the duration of the COVID-19 declaration under Section 564(b)(1) of the Act, 21 U.S.C. section 360bbb-3(b)(1), unless the authorization is terminated or revoked.  Performed at [facility], [place] [address] [place], [place] [address]   Culture, body fluid w Gram Stain-bottle     Status: None (Preliminary result)   Collection Time: 11/11/20 12:27 PM   Specimen: Pleura  Result Value Ref Range Status   Specimen Description PLEURAL  Final   Special Requests NONE  Final   Culture   Final    NO GROWTH 1 DAY Performed at [place] [address] [place], [place] [address]    Report Status PENDING  Incomplete  Gram stain     Status: None (Preliminary result)   Collection Time: 11/11/20 12:27 PM   Specimen: Pleura  Result Value Ref Range Status   Specimen Description PLEURAL  Final   Special Requests NONE  Final   Gram Stain   Final    CYTOSPIN SMEAR WBC PRESENT,BOTH PMN AND MONONUCLEAR NO ORGANISMS SEEN Performed at [facility], [address] [place], [place] [address]    Report Status PENDING  Incomplete      Radiology Studies: No results found.  Scheduled Meds:  amitriptyline  50 mg Oral QHS   amLODipine  10 mg Oral q morning   atorvastatin  40 mg Oral QHS   cefadroxil  1,000 mg Oral BID   docusate sodium  100 mg Oral BID   enoxaparin (LOVENOX) injection  40 mg Subcutaneous Q24H   gabapentin  100 mg Oral BID    linaclotide  145 mcg Oral QAC breakfast   metroNIDAZOLE  500 mg Oral Q12H   mometasone-formoterol  2 puff Inhalation BID   pantoprazole  40 mg Oral q morning   polycarbophil  625 mg Oral Daily   polyethylene glycol  17 g Oral Daily   senna  1  tablet Oral Daily   Continuous Infusions:   LOS: 1 day   Marylu Lund, MD [facility] Pager On Amion  If 7PM-7AM, please contact night-coverage 11/13/2020, 5:16 PM

## 2020-11-13 NOTE — Progress Notes (Signed)
Progress Note     Subjective: Patient reports abdominal pain is improving. Breathing feels better. She denies nausea and is tolerating PO intake. Reports she is hungry this AM. She has not had a BM in last 24 hrs but is passing flatus and had some loose stools prior to readmission. She is concerned that she had an appointment today and was supposed to be drug tested and asking if she could do that here.   Objective: Vital signs in last 24 hours: Temp:  [98.6 F (37 C)-99.4 F (37.4 C)] 99.3 F (37.4 C) (11/03 0543) Pulse Rate:  [84-91] 91 (11/03 0543) Resp:  [16-19] 18 (11/03 0543) BP: (104-140)/(60-71) 140/71 (11/03 0543) SpO2:  [88 %-98 %] 91 % (11/03 0852) Last BM Date:  (Pt not sure date of last BM)  Intake/Output from previous day: 11/02 0701 - 11/03 0700 In: 960 [P.O.:960] Out: -  Intake/Output this shift: No intake/output data recorded.  PE: Gen:  Alert, NAD Pulm: rate and effort normal Abd: Soft, ND, mild RLQ TTP without peritonitis, +BS, lap incisions cdi, drain with minimal serosanguinous fluid in bulb   Lab Results:  Recent Labs    11/11/20 0549 11/12/20 0729  WBC 14.5* 11.4*  HGB 11.4* 10.8*  HCT 34.4* 32.9*  PLT 540* 589*   BMET Recent Labs    11/11/20 0549 11/12/20 0729  NA 130* 130*  K 4.5 4.1  CL 97* 98  CO2 24 28  GLUCOSE 105* 104*  BUN 7* 7*  CREATININE 0.78 0.74  CALCIUM 7.9* 7.8*   PT/INR No results for input(s): LABPROT, INR in the last 72 hours. CMP     Component Value Date/Time   NA 130 (L) 11/12/2020 0729   NA 141 06/06/2015 0000   K 4.1 11/12/2020 0729   K 4.7 06/13/2015 0000   CL 98 11/12/2020 0729   CO2 28 11/12/2020 0729   GLUCOSE 104 (H) 11/12/2020 0729   BUN 7 (L) 11/12/2020 0729   BUN 8 06/06/2015 0000   CREATININE 0.74 11/12/2020 0729   CALCIUM 7.8 (L) 11/12/2020 0729   PROT 6.3 (L) 11/12/2020 0729   ALBUMIN 2.0 (L) 11/12/2020 0729   AST 21 11/12/2020 0729   ALT 7 11/12/2020 0729   ALKPHOS 84 11/12/2020  0729   BILITOT 0.5 11/12/2020 0729   GFRNONAA >60 11/12/2020 0729   GFRAA >60 06/28/2018 0535   Lipase     Component Value Date/Time   LIPASE 31 11/11/2020 0549       Studies/Results: DG Chest 1 View  Result Date: 11/11/2020 CLINICAL DATA:  Status post right thoracentesis EXAM: CHEST  1 VIEW COMPARISON:  Previous studies including the examination done earlier today FINDINGS: Transverse diameter of heart is increased. There are no signs of pulmonary edema. There is interval decrease in right pleural effusion. There is no pneumothorax. There is residual increased density in right lower lung fields suggesting residual pleural effusion and underlying atelectasis/pneumonia. Increased density in the medial left lower lung fields suggesting atelectasis. Minimal left pleural effusion is seen. IMPRESSION: There is significant interval decrease in right pleural effusion after thoracentesis. There is no pneumothorax. There is moderate residual right pleural effusion. Minimal left pleural effusion is seen. Infiltrates are seen in both lower lung fields suggesting atelectasis/pneumonia. Electronically Signed   By: Elmer Picker M.D.   On: 11/11/2020 12:41   NM Pulmonary Perfusion  Result Date: 11/11/2020 CLINICAL DATA:  Hypoxia and COPD. Shortness of breath and right-sided chest pain. Chronic kidney disease.  EXAM: NUCLEAR MEDICINE PERFUSION LUNG SCAN TECHNIQUE: Perfusion images were obtained in multiple projections after intravenous injection of radiopharmaceutical. Ventilation scans intentionally deferred if perfusion scan and chest x-ray adequate for interpretation during COVID 19 epidemic. RADIOPHARMACEUTICALS:  4.32 mCi Tc-50m MAA IV COMPARISON:  Chest radiograph 11/11/2020 and CT scans from 11/11/2020 and 11/07/2020 FINDINGS: The patient has a known large right and small left pleural effusion. The right pleural effusion is associated with complete atelectasis of the right lower lobe and right  middle lobe on recent CT. Perfusion defects Cleora Fleet the pleural effusions are present. There is no wedge-shaped perfusion defect identified in the aerated lung to indicate the presence of pulmonary embolus. IMPRESSION: 1. No wedge-shaped perfusion defect identified in the aerated lung to indicate the presence of pulmonary embolus. This corresponds to a PISAPED criteria "pulmonary embolism absent." 2. Perfusion defects corresponding to regions of large right and small left pleural effusion noted. Electronically Signed   By: Van Clines M.D.   On: 11/11/2020 11:41   US THORACENTESIS ASP PLEURAL SPACE W/IMG GUIDE  Result Date: 11/11/2020 INDICATION: Patient with recent history of perforated appendicitis with sepsis requiring surgical repair, dyspnea, right greater than left pleural effusions. Request received for diagnostic and therapeutic right thoracentesis. EXAM: ULTRASOUND GUIDED DIAGNOSTIC AND THERAPEUTIC RIGHT THORACENTESIS MEDICATIONS: 10 mL 1% lidocaine COMPLICATIONS: None immediate. PROCEDURE: An ultrasound guided thoracentesis was thoroughly discussed with the patient and questions answered. The benefits, risks, alternatives and complications were also discussed. The patient understands and wishes to proceed with the procedure. Written consent was obtained. Ultrasound was performed to localize and mark an adequate pocket of fluid in the right chest. The area was then prepped and draped in the normal sterile fashion. 1% Lidocaine was used for local anesthesia. Under ultrasound guidance a 6 Fr Safe-T-Centesis catheter was introduced. Thoracentesis was performed. The catheter was removed and a dressing applied. FINDINGS: A total of approximately 1.4 liters of hazy, amber fluid was removed. Samples were sent to the laboratory as requested by the clinical team. Due to chest discomfort and some coughing only the above amount of fluid was removed today. IMPRESSION: Successful ultrasound guided diagnostic  and therapeutic right thoracentesis yielding 1.4 liters of pleural fluid. Read by: Rowe Robert, PA-C Electronically Signed   By: Sandi Mariscal M.D.   On: 11/11/2020 12:42    Anti-infectives: Anti-infectives (From admission, onward)    Start     Dose/Rate Route Frequency Ordered Stop   11/12/20 1000  cefadroxil (DURICEF) capsule 1,000 mg  Status:  Discontinued        1,000 mg Oral 2 times daily 11/12/20 0823 11/12/20 0830   11/12/20 0915  metroNIDAZOLE (FLAGYL) tablet 500 mg  Status:  Discontinued        500 mg Oral Every 8 hours 11/12/20 0823 11/12/20 0832   11/11/20 2200  cefadroxil (DURICEF) capsule 1,000 mg        1,000 mg Oral 2 times daily 11/11/20 1626     11/11/20 2200  metroNIDAZOLE (FLAGYL) tablet 500 mg        500 mg Oral Every 12 hours 11/11/20 1626          Assessment/Plan Perforated appendicitis with feculent and purulent peritonitis  POD#10 s/p Laparoscopic lysis of adhesions x30 minutes, laparoscopic appendectomy, washout and drain placement 10/24 Dr. Kae Heller - PO cefadroxil and flagyl were prescribed at discharge. She does have an E coli bacteremia - abx per primary/ID for this  - tolerating diet, continue bowel regimen  -  CT 10/28 with no definite abscess  - CT 11/1 with stable fluid and phlegmon but no discrete abscess - ok to remove JP today  - mobilize, continue PT - no follow up recommended - no indication for acute surgical intervention. Patient is stable for discharge from an abdominal standpoint once medically cleared   ID - PO cefadroxil and flagyl unless IV abx indicated otherwise  FEN - HH diet, bowel regimen VTE - SCDs, ok for lovenox from a surgical standpoint Foley - none   E coli bacteremia COPD Hypoxia - no PE noted on VQ scan, L right pleural effusion noted increased from previous, thoracentesis done 11/1 and 1.4 L removed Rheumatoid arthritis  HTN HLD GERD CKD  Anemia  Tobacco abuse Cocaine use  LOS: 1 day    Norm Parcel,  [facility] 11/13/2020, 9:34 AM Please see Amion for pager number during day hours 7:00am-4:30pm

## 2020-11-13 NOTE — TOC Progression Note (Signed)
Transition of Care [facility] - Progression Note    Patient Details  Name: Michi Herrmann MRN: [id number] Date of Birth: [birth year]  Transition of Care [facility]) CM/SW Contact  Yuvan Medinger, Juliann Pulse, RN Phone Number: 11/13/2020, 2:03 PM  Clinical Narrative:   qualify for home 02-[employer] rep Zelphia Cairo already following-await home 02 order prior delivering to rm.Await PT recc.    Expected Discharge Plan: Home/Self Care Barriers to Discharge: Continued Medical Work up  Expected Discharge Plan and Services Expected Discharge Plan: Home/Self Care   Discharge Planning Services: CM Consult   Living arrangements for the past 2 months: Single Family Home Expected Discharge Date:  (unknown)                                     Social Determinants of Health (SDOH) Interventions    Readmission Risk Interventions Readmission Risk Prevention Plan 11/13/2020 11/10/2020  Transportation Screening Complete Complete  PCP or Specialist Appt within 5-7 Days - Complete  PCP or Specialist Appt within 3-5 Days Complete -  Home Care Screening - Complete  Medication Review (RN CM) - Complete  HRI or Home Care Consult Complete -  Social Work Consult for Recovery Care Planning/Counseling Complete -  Palliative Care Screening Complete -  Medication Review Press photographer) Complete -  Some recent data might be hidden

## 2020-11-14 DIAGNOSIS — J90 Pleural effusion, not elsewhere classified: Secondary | ICD-10-CM | POA: Diagnosis not present

## 2020-11-14 LAB — COMPREHENSIVE METABOLIC PANEL
ALT: 8 U/L (ref 0–44)
AST: 18 U/L (ref 15–41)
Albumin: 2.2 g/dL — ABNORMAL LOW (ref 3.5–5.0)
Alkaline Phosphatase: 72 U/L (ref 38–126)
Anion gap: 6 (ref 5–15)
BUN: 7 mg/dL — ABNORMAL LOW (ref 8–23)
CO2: 25 mmol/L (ref 22–32)
Calcium: 7.9 mg/dL — ABNORMAL LOW (ref 8.9–10.3)
Chloride: 100 mmol/L (ref 98–111)
Creatinine, Ser: 0.77 mg/dL (ref 0.44–1.00)
GFR, Estimated: >60 mL/min (ref >60)
Glucose, Bld: 109 mg/dL — ABNORMAL HIGH (ref 70–99)
Potassium: 4.2 mmol/L (ref 3.5–5.1)
Sodium: 131 mmol/L — ABNORMAL LOW (ref 135–145)
Total Bilirubin: 0.7 mg/dL (ref 0.3–1.2)
Total Protein: 6.5 g/dL (ref 6.5–8.1)

## 2020-11-14 LAB — GRAM STAIN

## 2020-11-14 LAB — MAGNESIUM: Magnesium: 1.7 mg/dL (ref 1.7–2.4)

## 2020-11-14 MED ORDER — TROLAMINE SALICYLATE 10 % EX CREA
TOPICAL_CREAM | CUTANEOUS | Status: DC | PRN
  Filled 2020-11-14 (×2): qty 85

## 2020-11-14 MED ORDER — MUSCLE RUB 10-15 % EX CREA
TOPICAL_CREAM | CUTANEOUS | Status: DC | PRN
  Filled 2020-11-14: qty 85

## 2020-11-14 NOTE — Plan of Care (Signed)
  Problem: Activity: Goal: Risk for activity intolerance will decrease Outcome: Progressing   Problem: Nutrition: Goal: Adequate nutrition will be maintained Outcome: Progressing   Problem: Coping: Goal: Level of anxiety will decrease Outcome: Progressing   Problem: Elimination: Goal: Will not experience complications related to bowel motility Outcome: Progressing Goal: Will not experience complications related to urinary retention Outcome: Progressing   
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
## 2020-11-14 NOTE — Progress Notes (Signed)
PROGRESS NOTE    [name]  OIN:[id number] DOB: 30-Jun-1955 DOA: 11/11/2020 PCP: Nolene Ebbs, MD    Brief Narrative:  65 y.o. female with medical history significant of HTN, HLD, GERD, recent perforated appendicitis s/p lap appy and drain placement. Presenting with abdominal pain and shortness of breath. She was discharged yesterday from [facility] after presenting with a perforated appendix and e coli bacteremia. She had a lap appy performed. A drain was placed after washout. She was started on abx for e coli. She improved and was discharged home on an abx regimen to continue for an additional 8 days. At the time she was hypoxic on 4L [place]. She was discharged to home on O2 and IS. She reports last night she was short of breath and she felt pain all over. Work up demonstrated large pleural effusion, now s/p thoracentesis yielding exudative effusion  Assessment & Plan:   Active Problems:   Dyspnea on exertion   Pleural effusion  Acute hypoxemic respiratory failure secondary to Right pleural effusion     -Patient was recently discharged requiring 4 L nasal cannula at time of discharge, patient is O2 nave  -Patient now status post thoracentesis, fluid analysis reviewed, findings suggestive of exudative effusion,, cultures thus far negative     - continue IS     - d-dimer elevated, but VQ is negative     -discussed with ID, recommendation to continue antibiotics below. ID signed off   Abdominal pain with recent perforated appendicitis status post laparoscopic appendectomy with washout and drain placement -General surgery following -Patient is continued on oral cefadroxil and Flagyl -Drain since pulled per general surgery, general surgery has since signed off -f/u PT eval   HTN     - continue on home regimen as tolerated   GERD     - continue PPI as tolerated   HLD     - continue home regimen   Tobacco abuse     -Cessation done at time of presentation   Hyponatremia     -Sodium  remained stable at 130, essentially unchanged from prior -sodium stable -Serum osmole's reviewed, low, urine sodium is high at 110.  Suggestive of SIADH -Would place patient on fluid restricted diet -Repeat sodium in the morning   DVT prophylaxis: Lovenox subq Code Status: Full Family Communication: Pt in room family not at bedside  Status is: Inpatient  Remains inpatient secondary to severity of illness   Consultants:  ID General Surgery  Procedures:    Antimicrobials: Anti-infectives (From admission, onward)    Start     Dose/Rate Route Frequency Ordered Stop   11/12/20 1000  cefadroxil (DURICEF) capsule 1,000 mg  Status:  Discontinued        1,000 mg Oral 2 times daily 11/12/20 0823 11/12/20 0830   11/12/20 0915  metroNIDAZOLE (FLAGYL) tablet 500 mg  Status:  Discontinued        500 mg Oral Every 8 hours 11/12/20 0823 11/12/20 0832   11/11/20 2200  cefadroxil (DURICEF) capsule 1,000 mg        1,000 mg Oral 2 times daily 11/11/20 1626     11/11/20 2200  metroNIDAZOLE (FLAGYL) tablet 500 mg        500 mg Oral Every 12 hours 11/11/20 1626         Subjective: Complains of weakness. Also reported not wanting to go back to group home because of social issues involving roommate  Objective: Vitals:   11/13/20 1949 11/14/20 0345 11/14/20 7096  11/14/20 1354  BP: 112/66 115/65  109/62  Pulse: 93 88  94  Resp: 17   18  Temp: 99 F (37.2 C) 98.9 F (37.2 C)  98.5 F (36.9 C)  TempSrc: Oral Oral  Oral  SpO2: 91% 93% 92% 95%  Weight:      Height:        Intake/Output Summary (Last 24 hours) at 11/14/2020 1817 Last data filed at 11/14/2020 8413 Gross per 24 hour  Intake 240 ml  Output 300 ml  Net -60 ml    Filed Weights   11/11/20 2100  Weight: 67.4 kg    Examination: General exam: Awake, laying in bed, in nad Respiratory system: Normal respiratory effort, no wheezing Cardiovascular system: regular rate, s1, s2 Gastrointestinal system: Soft, nondistended,  positive BS Central nervous system: CN2-12 grossly intact, strength intact Extremities: Perfused, no clubbing Skin: Normal skin turgor, no notable skin lesions seen Psychiatry: Mood normal // no visual hallucinations   Data Reviewed: I have personally reviewed following labs and imaging studies  CBC: Recent Labs  Lab 11/08/20 0030 11/11/20 0549 11/12/20 0729  WBC 8.6 14.5* 11.4*  NEUTROABS 5.2 11.8*  --   HGB 11.5* 11.4* 10.8*  HCT 34.8* 34.4* 32.9*  MCV 88.3 89.8 90.1  PLT 332 540* 589*    Basic Metabolic Panel: Recent Labs  Lab 11/08/20 0030 11/09/20 0425 11/11/20 0549 11/12/20 0729 11/14/20 0425  NA 130* 131* 130* 130* 131*  K 4.3 4.5 4.5 4.1 4.2  CL 97* 99 97* 98 100  CO2 27 22 24 28 25   GLUCOSE 106* 92 105* 104* 109*  BUN 6* <5* 7* 7* 7*  CREATININE 0.86 0.81 0.78 0.74 0.77  CALCIUM 8.1* 8.3* 7.9* 7.8* 7.9*  MG  --   --   --   --  1.7    GFR: Estimated Creatinine Clearance: 64.8 mL/min (by C-G formula based on SCr of 0.77 mg/dL). Liver Function Tests: Recent Labs  Lab 11/11/20 0549 11/12/20 0729 11/14/20 0425  AST 25 21 18   ALT 9 7 8   ALKPHOS 100 84 72  BILITOT 1.2 0.5 0.7  PROT 7.0 6.3* 6.5  ALBUMIN 2.5* 2.0* 2.2*    Recent Labs  Lab 11/11/20 0549  LIPASE 31    No results for input(s): AMMONIA in the last 168 hours. Coagulation Profile: No results for input(s): INR, PROTIME in the last 168 hours. Cardiac Enzymes: No results for input(s): CKTOTAL, CKMB, CKMBINDEX, TROPONINI in the last 168 hours. BNP (last 3 results) No results for input(s): PROBNP in the last 8760 hours. HbA1C: No results for input(s): HGBA1C in the last 72 hours. CBG: No results for input(s): GLUCAP in the last 168 hours. Lipid Profile: No results for input(s): CHOL, HDL, LDLCALC, TRIG, CHOLHDL, LDLDIRECT in the last 72 hours. Thyroid Function Tests: No results for input(s): TSH, T4TOTAL, FREET4, T3FREE, THYROIDAB in the last 72 hours. Anemia Panel: No results for  input(s): VITAMINB12, FOLATE, FERRITIN, TIBC, IRON, RETICCTPCT in the last 72 hours. Sepsis Labs: Recent Labs  Lab 11/11/20 0549  LATICACIDVEN 0.8     Recent Results (from the past 240 hour(s))  Resp Panel by RT-PCR (Flu A&B, Covid) Nasopharyngeal Swab     Status: None   Collection Time: 11/11/20 11:08 AM   Specimen: Nasopharyngeal Swab; Nasopharyngeal(NP) swabs in vial transport medium  Result Value Ref Range Status   SARS Coronavirus 2 by RT PCR NEGATIVE NEGATIVE Final    Comment: (NOTE) SARS-CoV-2 target nucleic acids are NOT DETECTED.  The SARS-CoV-2 RNA is generally detectable in upper respiratory specimens during the acute phase of infection. The lowest concentration of SARS-CoV-2 viral copies this assay can detect is 138 copies/mL. A negative result does not preclude SARS-Cov-2 infection and should not be used as the sole basis for treatment or other patient management decisions. A negative result may occur with  improper specimen collection/handling, submission of specimen other than nasopharyngeal swab, presence of viral mutation(s) within the areas targeted by this assay, and inadequate number of viral copies(<138 copies/mL). A negative result must be combined with clinical observations, patient history, and epidemiological information. The expected result is Negative.  Fact Sheet for Patients:  EntrepreneurPulse.com.au  Fact Sheet for Healthcare Providers:  IncredibleEmployment.be  This test is no t yet approved or cleared by the [place] FDA and  has been authorized for detection and/or diagnosis of SARS-CoV-2 by FDA under an Emergency Use Authorization (EUA). This EUA will remain  in effect (meaning this test can be used) for the duration of the COVID-19 declaration under Section 564(b)(1) of the Act, 21 U.S.C.section 360bbb-3(b)(1), unless the authorization is terminated  or revoked sooner.       Influenza A by PCR  NEGATIVE NEGATIVE Final   Influenza B by PCR NEGATIVE NEGATIVE Final    Comment: (NOTE) The Xpert Xpress SARS-CoV-2/FLU/RSV plus assay is intended as an aid in the diagnosis of influenza from Nasopharyngeal swab specimens and should not be used as a sole basis for treatment. Nasal washings and aspirates are unacceptable for Xpert Xpress SARS-CoV-2/FLU/RSV testing.  Fact Sheet for Patients: EntrepreneurPulse.com.au  Fact Sheet for Healthcare Providers: IncredibleEmployment.be  This test is not yet approved or cleared by the [place] FDA and has been authorized for detection and/or diagnosis of SARS-CoV-2 by FDA under an Emergency Use Authorization (EUA). This EUA will remain in effect (meaning this test can be used) for the duration of the COVID-19 declaration under Section 564(b)(1) of the Act, 21 U.S.C. section 360bbb-3(b)(1), unless the authorization is terminated or revoked.  Performed at [facility], [place] [address] [place], [place] [address]   Culture, body fluid w Gram Stain-bottle     Status: None (Preliminary result)   Collection Time: 11/11/20 12:27 PM   Specimen: Pleura  Result Value Ref Range Status   Specimen Description PLEURAL  Final   Special Requests NONE  Final   Culture   Final    NO GROWTH 2 DAYS Performed at [facility] [address] [place], [place] [address]    Report Status PENDING  Incomplete  Gram stain     Status: None   Collection Time: 11/11/20 12:27 PM   Specimen: Pleura  Result Value Ref Range Status   Specimen Description PLEURAL  Final   Special Requests NONE  Final   Gram Stain   Final    CYTOSPIN SMEAR WBC PRESENT,BOTH PMN AND MONONUCLEAR NO ORGANISMS SEEN Performed at [facility], [address] [place], [place] [address]    Report Status 11/14/2020 FINAL  Final      Radiology Studies: No results found.  Scheduled Meds:  amitriptyline  50 mg Oral  QHS   amLODipine  10 mg Oral q morning   atorvastatin  40 mg Oral QHS   cefadroxil  1,000 mg Oral BID   docusate sodium  100 mg Oral BID   enoxaparin (LOVENOX) injection  40 mg Subcutaneous Q24H   gabapentin  100 mg Oral BID   linaclotide  145 mcg Oral QAC breakfast   metroNIDAZOLE  500 mg Oral Q12H   mometasone-formoterol  2 puff Inhalation BID   pantoprazole  40 mg Oral q morning   polycarbophil  625 mg Oral Daily   polyethylene glycol  17 g Oral Daily   senna  1 tablet Oral Daily   Continuous Infusions:   LOS: 2 days   Marylu Lund, MD [facility] Pager On Amion  If 7PM-7AM, please contact night-coverage 11/14/2020, 6:17 PM

## 2020-11-14 NOTE — Progress Notes (Signed)
Patient with a brief episode of SVT reaching heart rate of 180 that lasted a couple of seconds. Patient asymptomatic.  On call provider made aware. No new orders placed

## 2020-11-14 NOTE — Progress Notes (Addendum)
PT Cancellation Note  Patient Details Name: [name] MRN: [id number] DOB: [birth year]   Cancelled Treatment:    Reason Eval/Treat Not Completed: Fatigue/lethargy limiting ability to participate (Pt deferring mobility/PT eval at this time reporting she is not feeling well and wants to rest. When discussed about ambulation with RN day prior (to bathroom/in hallway), pt states that she still wants PT to come back to perform evaluation when she feels better, RN notified)   Festus Barren., PT, DPT  [place]  Office [phone] 11/14/2020, 4:30 PM

## 2020-11-15 ENCOUNTER — Inpatient Hospital Stay (HOSPITAL_COMMUNITY): Payer: Medicare Other

## 2020-11-15 DIAGNOSIS — J90 Pleural effusion, not elsewhere classified: Secondary | ICD-10-CM | POA: Diagnosis not present

## 2020-11-15 DIAGNOSIS — R0902 Hypoxemia: Secondary | ICD-10-CM | POA: Diagnosis not present

## 2020-11-15 MED ORDER — MUSCLE RUB 10-15 % EX CREA
TOPICAL_CREAM | CUTANEOUS | Status: DC | PRN
  Filled 2020-11-15: qty 85

## 2020-11-15 NOTE — Progress Notes (Signed)
Patient with increased coughing. Patient with hacking cough and scant sputum noted. Dr. Wyline Copas notified of coughing. Chest x-ray ordered by Dr. Wyline Copas.

## 2020-11-15 NOTE — Evaluation (Signed)
Physical Therapy Evaluation Patient Details Name: [name] MRN: [id number] DOB: [birth year] Today's Date: 11/15/2020  History of Present Illness  65 y.o. female presenting with abdominal pain and shortness of breath Work up revealed  large pleural effusion, now s/p thoracentesis yielding exudative effusion. Pt was recently d/c from [facility] on 10/31 for sepsis secondary to perforated appendicitis, ileus, E. coli bacteremia. S/p laproscopic appendectomy 10/24. requiring 4L [place] at time of d/c. PMH significant of HTN, HLD, GERD, CKD stage III, COPD, RA, ACDF (2017).   Clinical Impression  Pt is a 65 y.o. female with above HPI resulting in the deficits listed below (see PT Problem List). Pt performed all bed mobility and transfers with close supervision -MOD I level demonstrating safe hand placement during transfers with use of AD. Pt initially requiring encouragement to participate in session. She was able to ambulate total of 40ft in room with MIN guard-supervision for safety with no overt LOB observed. Pt with O2 sat of 87% on RA with mobility, recovery to 92% with seated rest and cues for deep breathing techniques. It seems that pt lives in a [place] home with roommates and is independent with ADLs and use of RW/rollator at baseline. Pt will benefit from skilled PT during stay to maximize functional mobility and improve activity tolerance, but anticipate no further skilled PT needs upon d/c.          Recommendations for follow up therapy are one component of a multi-disciplinary discharge planning process, led by the attending physician.  Recommendations may be updated based on patient status, additional functional criteria and insurance authorization.  Follow Up Recommendations No PT follow up    Assistance Recommended at Discharge PRN  Functional Status Assessment Patient has had a recent decline in their functional status and demonstrates the ability to make significant improvements in  function in a reasonable and predictable amount of time.  Equipment Recommendations  None recommended by PT    Recommendations for Other Services       Precautions / Restrictions Precautions Precautions: Fall Restrictions Weight Bearing Restrictions: No      Mobility  Bed Mobility Overal bed mobility: Modified Independent Bed Mobility: Supine to Sit;Sit to Supine     Supine to sit: Modified independent (Device/Increase time) Sit to supine: Modified independent (Device/Increase time)   General bed mobility comments: HOB elevated; pt intially requiring encouragement to participate in session. ultimately agreeable and coming to EOB reporting that she has to use the bathroom    Transfers Overall transfer level: Needs assistance Equipment used: Rolling walker (2 wheels) Transfers: Sit to/from Stand Sit to Stand: Supervision;Modified independent (Device/Increase time)           General transfer comment: x2 from EOB and toilet, supervision initally from EOB provided for safety. Pt able to rise from toilet with MOD I and use of UE support. Pt  demonstrated safe hand placement during transfers    Ambulation/Gait Ambulation/Gait assistance: Min guard;Supervision Gait Distance (Feet): 14 Feet Assistive device: Rolling walker (2 wheels) Gait Pattern/deviations: Step-through pattern;Decreased stride length;Trunk flexed Gait velocity: decreased   General Gait Details: 55ft x2 to/ from bathroom. slow gait but overall steady with use of RW. Pt reporting increased SOB on way back to bathroom and then began to demonstrate increased knee flexion/crouching gait stating " I need to go back to the bed, i'm going to fall" PT providing cues for increased knee flexion and upright posture and reassurance that pt has strength and assist from therapist and RW to  maintain standing balance safely. Pt able to return to EOB safely without increased asisstance from therapist. Pt with O2 of 87% on RA  with mobility,  recovery to 92% with seated rest and cues for deep breathing techniques. Pt deferring further ambulation due to fatigue.  Stairs            Wheelchair Mobility    Modified Rankin (Stroke Patients Only)       Balance Overall balance assessment: Needs assistance Sitting-balance support: No upper extremity supported;Feet supported Sitting balance-Leahy Scale: Normal Sitting balance - Comments: indep with pericare sitting on toilet   Standing balance support: Bilateral upper extremity supported;During functional activity Standing balance-Leahy Scale: Fair Standing balance comment: static standing without UE support                             Pertinent Vitals/Pain Pain Assessment: Faces Faces Pain Scale: Hurts little more Pain Location: both sides of trunk Pain Descriptors / Indicators: Sore;Grimacing;Discomfort Pain Intervention(s): Limited activity within patient's tolerance;Monitored during session;Premedicated before session;Repositioned    Home Living Family/patient expects to be discharged to:: Private residence Living Arrangements: Alone Available Help at Discharge: Friend(s);Available PRN/intermittently Type of Home: Group Home (rooming home?) Home Access: Stairs to enter Entrance Stairs-Rails: Can reach both Entrance Stairs-Number of Steps: 2 Alternate Level Stairs-Number of Steps: Flight Home Layout: Two level (bedroom main level) Home Equipment: Rollator (4 wheels);Cane - single Barista (2 wheels) (home O2) Additional Comments: pt reports she has nephew that lives near; has roommates. Reports rollator she uses is her newphews. Pt refused PT eval yesterday due to feeling fatigue and reporting to therapist that she has roomates but "men don't know how to take care of no woman" and asking therapist if she is the one who makes recommendations for "rehab". PT educated pt on role in acute care settings and recommedations made to  ensure safety upon d/c based on pt performance during sessions.    Prior Function Prior Level of Function : Independent/Modified Independent;Driving             Mobility Comments: using rollator/RW ADLs Comments: independent with bathing, dressing     Hand Dominance   Dominant Hand: Right    Extremity/Trunk Assessment   Upper Extremity Assessment Upper Extremity Assessment: Overall WFL for tasks assessed    Lower Extremity Assessment Lower Extremity Assessment: Overall WFL for tasks assessed    Cervical / Trunk Assessment Cervical / Trunk Assessment: Normal  Communication   Communication: No difficulties  Cognition Arousal/Alertness: Awake/alert;Lethargic Behavior During Therapy: WFL for tasks assessed/performed Overall Cognitive Status: Within Functional Limits for tasks assessed                                 General Comments: pt seemingly lethargic at beginning, improved with supine to sit and throughout rest of session        General Comments      Exercises     Assessment/Plan    PT Assessment Patient needs continued PT services  PT Problem List Decreased activity tolerance;Decreased balance;Decreased mobility;Cardiopulmonary status limiting activity;Pain       PT Treatment Interventions DME instruction;Gait training;Stair training;Functional mobility training;Therapeutic activities;Therapeutic exercise;Balance training;Patient/family education    PT Goals (Current goals can be found in the Care Plan section)  Acute Rehab PT Goals Patient Stated Goal: less discomfort PT Goal Formulation: With patient Time For Goal Achievement:  11/29/20 Potential to Achieve Goals: Good    Frequency Min 3X/week   Barriers to discharge Decreased caregiver support      Co-evaluation               AM-PAC PT "6 Clicks" Mobility  Outcome Measure Help needed turning from your back to your side while in a flat bed without using bedrails?:  None Help needed moving from lying on your back to sitting on the side of a flat bed without using bedrails?: None Help needed moving to and from a bed to a chair (including a wheelchair)?: A Little Help needed standing up from a chair using your arms (e.g., wheelchair or bedside chair)?: None Help needed to walk in hospital room?: A Little Help needed climbing 3-5 steps with a railing? : A Little 6 Click Score: 21    End of Session Equipment Utilized During Treatment: Gait belt Activity Tolerance: Patient tolerated treatment well;Patient limited by fatigue Patient left: in bed;with call bell/phone within reach;with bed alarm set Nurse Communication: Mobility status PT Visit Diagnosis: Other abnormalities of gait and mobility (R26.89)    Time: 3220-2542 PT Time Calculation (min) (ACUTE ONLY): 21 min   Charges:   PT Evaluation $PT Eval Low Complexity: 1 Low          Festus Barren PT, DPT  Acute Rehabilitation Services  Office [phone]   11/15/2020, 1:16 PM

## 2020-11-15 NOTE — Progress Notes (Signed)
PROGRESS NOTE    [name]  XTK:[id number] DOB: November 16, 1955 DOA: 11/11/2020 PCP: Nolene Ebbs, MD    Brief Narrative:  65 y.o. female with medical history significant of HTN, HLD, GERD, recent perforated appendicitis s/p lap appy and drain placement. Presenting with abdominal pain and shortness of breath. She was discharged yesterday from [facility] after presenting with a perforated appendix and e coli bacteremia. She had a lap appy performed. A drain was placed after washout. She was started on abx for e coli. She improved and was discharged home on an abx regimen to continue for an additional 8 days. At the time she was hypoxic on 4L [place]. She was discharged to home on O2 and IS. She reports last night she was short of breath and she felt pain all over. Work up demonstrated large pleural effusion, now s/p thoracentesis yielding exudative effusion  Assessment & Plan:   Active Problems:   Dyspnea on exertion   Pleural effusion  Acute hypoxemic respiratory failure secondary to Right pleural effusion     -Patient was recently discharged requiring 4 L nasal cannula at time of discharge, patient is O2 nave  -Patient now status post thoracentesis, fluid analysis reviewed, findings suggestive of exudative effusion,, cultures thus far negative     - continue IS     - d-dimer elevated, but VQ is negative     -discussed with ID, recommendation to continue antibiotics below. ID signed off -On 11/5, pt noted to have increased coughing. Ordered and reviewed CXR with radiologist. Findings of increasing R sided pleural effusion -Have ordered repeat CT chest and abd w/o contrast -Will likely benefit from repeat thoracentesis   Abdominal pain with recent perforated appendicitis status post laparoscopic appendectomy with washout and drain placement -General surgery following -Patient is continued on oral cefadroxil and Flagyl -Drain since pulled per general surgery, general surgery has since signed  off -Pt has reported increased R sided pain. Repeat CT abd w/o contrast pending per above   HTN     - continue on home regimen as tolerated   GERD     - cont with PPI as tolerated   HLD     - continue home regimen   Tobacco abuse     -Cessation done at time of presentation   Hyponatremia     -Sodium remains stable -Serum osmole's reviewed, low, urine sodium is high at 110.  Suggestive of SIADH -Pt currently on 1500cc fluid restricted diet -cont to follow lytes   DVT prophylaxis: Lovenox subq Code Status: Full Family Communication: Pt in room family not at bedside  Status is: Inpatient  Remains inpatient secondary to severity of illness   Consultants:  ID General Surgery  Procedures:    Antimicrobials: Anti-infectives (From admission, onward)    Start     Dose/Rate Route Frequency Ordered Stop   11/12/20 1000  cefadroxil (DURICEF) capsule 1,000 mg  Status:  Discontinued        1,000 mg Oral 2 times daily 11/12/20 0823 11/12/20 0830   11/12/20 0915  metroNIDAZOLE (FLAGYL) tablet 500 mg  Status:  Discontinued        500 mg Oral Every 8 hours 11/12/20 0823 11/12/20 0832   11/11/20 2200  cefadroxil (DURICEF) capsule 1,000 mg        1,000 mg Oral 2 times daily 11/11/20 1626     11/11/20 2200  metroNIDAZOLE (FLAGYL) tablet 500 mg        500 mg Oral Every 12 hours 11/11/20  1626         Subjective: Complains of RLQ pain. Note earlier to be coughing loudly. Pt reporting increased sputum production  Objective: Vitals:   11/15/20 0502 11/15/20 0625 11/15/20 1058 11/15/20 1326  BP: 110/67  117/62 122/75  Pulse: 87  86 85  Resp: 15  15 16   Temp: 99.2 F (37.3 C)   97.9 F (36.6 C)  TempSrc: Oral   Oral  SpO2: 94% 91% 92% 92%  Weight:      Height:        Intake/Output Summary (Last 24 hours) at 11/15/2020 1647 Last data filed at 11/15/2020 1300 Gross per 24 hour  Intake 600 ml  Output --  Net 600 ml    Filed Weights   11/11/20 2100  Weight: 67.4 kg     Examination: General exam: Conversant, in no acute distress Respiratory system: normal chest rise, clear, no audible wheezing Cardiovascular system: regular rhythm, s1-s2 Gastrointestinal system: Nondistended, pos BS Central nervous system: No seizures, no tremors Extremities: No cyanosis, no joint deformities Skin: No rashes, no pallor Psychiatry: Affect normal // no auditory hallucinations   Data Reviewed: I have personally reviewed following labs and imaging studies  CBC: Recent Labs  Lab 11/11/20 0549 11/12/20 0729  WBC 14.5* 11.4*  NEUTROABS 11.8*  --   HGB 11.4* 10.8*  HCT 34.4* 32.9*  MCV 89.8 90.1  PLT 540* 589*    Basic Metabolic Panel: Recent Labs  Lab 11/09/20 0425 11/11/20 0549 11/12/20 0729 11/14/20 0425  NA 131* 130* 130* 131*  K 4.5 4.5 4.1 4.2  CL 99 97* 98 100  CO2 22 24 28 25   GLUCOSE 92 105* 104* 109*  BUN <5* 7* 7* 7*  CREATININE 0.81 0.78 0.74 0.77  CALCIUM 8.3* 7.9* 7.8* 7.9*  MG  --   --   --  1.7    GFR: Estimated Creatinine Clearance: 64.8 mL/min (by C-G formula based on SCr of 0.77 mg/dL). Liver Function Tests: Recent Labs  Lab 11/11/20 0549 11/12/20 0729 11/14/20 0425  AST 25 21 18   ALT 9 7 8   ALKPHOS 100 84 72  BILITOT 1.2 0.5 0.7  PROT 7.0 6.3* 6.5  ALBUMIN 2.5* 2.0* 2.2*    Recent Labs  Lab 11/11/20 0549  LIPASE 31    No results for input(s): AMMONIA in the last 168 hours. Coagulation Profile: No results for input(s): INR, PROTIME in the last 168 hours. Cardiac Enzymes: No results for input(s): CKTOTAL, CKMB, CKMBINDEX, TROPONINI in the last 168 hours. BNP (last 3 results) No results for input(s): PROBNP in the last 8760 hours. HbA1C: No results for input(s): HGBA1C in the last 72 hours. CBG: No results for input(s): GLUCAP in the last 168 hours. Lipid Profile: No results for input(s): CHOL, HDL, LDLCALC, TRIG, CHOLHDL, LDLDIRECT in the last 72 hours. Thyroid Function Tests: No results for input(s):  TSH, T4TOTAL, FREET4, T3FREE, THYROIDAB in the last 72 hours. Anemia Panel: No results for input(s): VITAMINB12, FOLATE, FERRITIN, TIBC, IRON, RETICCTPCT in the last 72 hours. Sepsis Labs: Recent Labs  Lab 11/11/20 0549  LATICACIDVEN 0.8     Recent Results (from the past 240 hour(s))  Resp Panel by RT-PCR (Flu A&B, Covid) Nasopharyngeal Swab     Status: None   Collection Time: 11/11/20 11:08 AM   Specimen: Nasopharyngeal Swab; Nasopharyngeal(NP) swabs in vial transport medium  Result Value Ref Range Status   SARS Coronavirus 2 by RT PCR NEGATIVE NEGATIVE Final    Comment: (NOTE)  SARS-CoV-2 target nucleic acids are NOT DETECTED.  The SARS-CoV-2 RNA is generally detectable in upper respiratory specimens during the acute phase of infection. The lowest concentration of SARS-CoV-2 viral copies this assay can detect is 138 copies/mL. A negative result does not preclude SARS-Cov-2 infection and should not be used as the sole basis for treatment or other patient management decisions. A negative result may occur with  improper specimen collection/handling, submission of specimen other than nasopharyngeal swab, presence of viral mutation(s) within the areas targeted by this assay, and inadequate number of viral copies(<138 copies/mL). A negative result must be combined with clinical observations, patient history, and epidemiological information. The expected result is Negative.  Fact Sheet for Patients:  EntrepreneurPulse.com.au  Fact Sheet for Healthcare Providers:  IncredibleEmployment.be  This test is no t yet approved or cleared by the [place] FDA and  has been authorized for detection and/or diagnosis of SARS-CoV-2 by FDA under an Emergency Use Authorization (EUA). This EUA will remain  in effect (meaning this test can be used) for the duration of the COVID-19 declaration under Section 564(b)(1) of the Act, 21 U.S.C.section  360bbb-3(b)(1), unless the authorization is terminated  or revoked sooner.       Influenza A by PCR NEGATIVE NEGATIVE Final   Influenza B by PCR NEGATIVE NEGATIVE Final    Comment: (NOTE) The Xpert Xpress SARS-CoV-2/FLU/RSV plus assay is intended as an aid in the diagnosis of influenza from Nasopharyngeal swab specimens and should not be used as a sole basis for treatment. Nasal washings and aspirates are unacceptable for Xpert Xpress SARS-CoV-2/FLU/RSV testing.  Fact Sheet for Patients: EntrepreneurPulse.com.au  Fact Sheet for Healthcare Providers: IncredibleEmployment.be  This test is not yet approved or cleared by the [place] FDA and has been authorized for detection and/or diagnosis of SARS-CoV-2 by FDA under an Emergency Use Authorization (EUA). This EUA will remain in effect (meaning this test can be used) for the duration of the COVID-19 declaration under Section 564(b)(1) of the Act, 21 U.S.C. section 360bbb-3(b)(1), unless the authorization is terminated or revoked.  Performed at [facility], [place] [address], [place], [place] [address]   Culture, body fluid w Gram Stain-bottle     Status: None (Preliminary result)   Collection Time: 11/11/20 12:27 PM   Specimen: Pleura  Result Value Ref Range Status   Specimen Description PLEURAL  Final   Special Requests NONE  Final   Culture   Final    NO GROWTH 3 DAYS Performed at [place] [address], [place], [place] [address]    Report Status PENDING  Incomplete  Gram stain     Status: None   Collection Time: 11/11/20 12:27 PM   Specimen: Pleura  Result Value Ref Range Status   Specimen Description PLEURAL  Final   Special Requests NONE  Final   Gram Stain   Final    CYTOSPIN SMEAR WBC PRESENT,BOTH PMN AND MONONUCLEAR NO ORGANISMS SEEN Performed at [facility], [address], [place], [place] [address]    Report Status 11/14/2020  FINAL  Final      Radiology Studies: DG CHEST PORT 1 VIEW  Result Date: 11/15/2020 CLINICAL DATA:  Pleural effusion.  Sternal pain. EXAM: PORTABLE CHEST 1 VIEW COMPARISON:  November 11, 2020 FINDINGS: There appears to be a small layering left effusion with atelectasis. There is a moderate right pleural effusion which has increased since November 11, 2020. No pneumothorax. No other changes. IMPRESSION: Small left  effusion. Moderate right effusion worsened in the interval. Opacity underlying the right effusion is likely atelectasis. No other change. Electronically Signed   By: Dorise Bullion III M.D.   On: 11/15/2020 16:32    Scheduled Meds:  amitriptyline  50 mg Oral QHS   amLODipine  10 mg Oral q morning   atorvastatin  40 mg Oral QHS   cefadroxil  1,000 mg Oral BID   docusate sodium  100 mg Oral BID   enoxaparin (LOVENOX) injection  40 mg Subcutaneous Q24H   gabapentin  100 mg Oral BID   linaclotide  145 mcg Oral QAC breakfast   metroNIDAZOLE  500 mg Oral Q12H   mometasone-formoterol  2 puff Inhalation BID   pantoprazole  40 mg Oral q morning   polycarbophil  625 mg Oral Daily   polyethylene glycol  17 g Oral Daily   senna  1 tablet Oral Daily   Continuous Infusions:   LOS: 3 days   Marylu Lund, MD [facility] Pager On Amion  If 7PM-7AM, please contact night-coverage 11/15/2020, 4:47 PM

## 2020-11-16 DIAGNOSIS — Z9889 Other specified postprocedural states: Secondary | ICD-10-CM

## 2020-11-16 DIAGNOSIS — J90 Pleural effusion, not elsewhere classified: Secondary | ICD-10-CM | POA: Diagnosis not present

## 2020-11-16 LAB — COMPREHENSIVE METABOLIC PANEL
ALT: 8 U/L (ref 0–44)
AST: 14 U/L — ABNORMAL LOW (ref 15–41)
Albumin: 2 g/dL — ABNORMAL LOW (ref 3.5–5.0)
Alkaline Phosphatase: 68 U/L (ref 38–126)
Anion gap: 7 (ref 5–15)
BUN: 8 mg/dL (ref 8–23)
CO2: 26 mmol/L (ref 22–32)
Calcium: 7.9 mg/dL — ABNORMAL LOW (ref 8.9–10.3)
Chloride: 97 mmol/L — ABNORMAL LOW (ref 98–111)
Creatinine, Ser: 0.62 mg/dL (ref 0.44–1.00)
GFR, Estimated: >60 mL/min (ref >60)
Glucose, Bld: 98 mg/dL (ref 70–99)
Potassium: 4.2 mmol/L (ref 3.5–5.1)
Sodium: 130 mmol/L — ABNORMAL LOW (ref 135–145)
Total Bilirubin: 0.4 mg/dL (ref 0.3–1.2)
Total Protein: 6.7 g/dL (ref 6.5–8.1)

## 2020-11-16 LAB — CBC
HCT: 31.1 % — ABNORMAL LOW (ref 36.0–46.0)
Hemoglobin: 9.9 g/dL — ABNORMAL LOW (ref 12.0–15.0)
MCH: 29 pg (ref 26.0–34.0)
MCHC: 31.8 g/dL (ref 30.0–36.0)
MCV: 91.2 fL (ref 80.0–100.0)
Platelets: 582 10*3/uL — ABNORMAL HIGH (ref 150–400)
RBC: 3.41 MIL/uL — ABNORMAL LOW (ref 3.87–5.11)
RDW: 14.5 % (ref 11.5–15.5)
WBC: 8.4 10*3/uL (ref 4.0–10.5)
nRBC: 0 % (ref 0.0–0.2)

## 2020-11-16 MED ORDER — MUSCLE RUB 10-15 % EX CREA
1.0000 "application " | TOPICAL_CREAM | Freq: Two times a day (BID) | CUTANEOUS | Status: DC | PRN
  Administered 2020-11-20: 1 via TOPICAL
  Filled 2020-11-16: qty 85
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
## 2020-11-17 ENCOUNTER — Inpatient Hospital Stay (HOSPITAL_COMMUNITY): Payer: Medicare Other

## 2020-11-17 DIAGNOSIS — J90 Pleural effusion, not elsewhere classified: Secondary | ICD-10-CM | POA: Diagnosis not present

## 2020-11-17 DIAGNOSIS — R0902 Hypoxemia: Secondary | ICD-10-CM | POA: Diagnosis not present

## 2020-11-17 DIAGNOSIS — Z9889 Other specified postprocedural states: Secondary | ICD-10-CM | POA: Diagnosis not present

## 2020-11-17 LAB — COMPREHENSIVE METABOLIC PANEL
ALT: 7 U/L (ref 0–44)
AST: 15 U/L (ref 15–41)
Albumin: 2.2 g/dL — ABNORMAL LOW (ref 3.5–5.0)
Alkaline Phosphatase: 74 U/L (ref 38–126)
Anion gap: 6 (ref 5–15)
BUN: 7 mg/dL — ABNORMAL LOW (ref 8–23)
CO2: 25 mmol/L (ref 22–32)
Calcium: 8.2 mg/dL — ABNORMAL LOW (ref 8.9–10.3)
Chloride: 99 mmol/L (ref 98–111)
Creatinine, Ser: 0.68 mg/dL (ref 0.44–1.00)
GFR, Estimated: >60 mL/min (ref >60)
Glucose, Bld: 100 mg/dL — ABNORMAL HIGH (ref 70–99)
Potassium: 4.3 mmol/L (ref 3.5–5.1)
Sodium: 130 mmol/L — ABNORMAL LOW (ref 135–145)
Total Bilirubin: 0.5 mg/dL (ref 0.3–1.2)
Total Protein: 6.9 g/dL (ref 6.5–8.1)

## 2020-11-17 LAB — CD19 AND CD20, FLOW CYTOMETRY

## 2020-11-17 LAB — CBC
HCT: 30.8 % — ABNORMAL LOW (ref 36.0–46.0)
Hemoglobin: 10 g/dL — ABNORMAL LOW (ref 12.0–15.0)
MCH: 29.4 pg (ref 26.0–34.0)
MCHC: 32.5 g/dL (ref 30.0–36.0)
MCV: 90.6 fL (ref 80.0–100.0)
Platelets: 564 10*3/uL — ABNORMAL HIGH (ref 150–400)
RBC: 3.4 MIL/uL — ABNORMAL LOW (ref 3.87–5.11)
RDW: 14.4 % (ref 11.5–15.5)
WBC: 7.5 10*3/uL (ref 4.0–10.5)
nRBC: 0 % (ref 0.0–0.2)

## 2020-11-17 LAB — LACTATE DEHYDROGENASE: LDH: 104 U/L (ref 98–192)

## 2020-11-17 LAB — CULTURE, BODY FLUID W GRAM STAIN -BOTTLE: Culture: NO GROWTH

## 2020-11-17 MED ORDER — LIDOCAINE HCL 1 % IJ SOLN
INTRAMUSCULAR | Status: AC
  Filled 2020-11-17: qty 20

## 2020-11-17 NOTE — Progress Notes (Signed)
Physical Therapy Treatment Patient Details Name: [name] MRN: [id number] DOB: 1955/03/27 Today's Date: 11/17/2020   History of Present Illness 65 y.o. female presenting with abdominal pain and shortness of breath Work up revealed  large pleural effusion, now s/p thoracentesis yielding exudative effusion. Pt was recently d/c from [facility] on 10/31 for sepsis secondary to perforated appendicitis, ileus, E. coli bacteremia. S/p laproscopic appendectomy 10/24. requiring 4L [place] at time of d/c. PMH significant of HTN, HLD, GERD, CKD stage III, COPD, RA, ACDF (2017).    PT Comments    Pt very reluctant to mobilize and required increased encouragement; also reviewed the benefits of being OOB.  Pt declined ambulating however eventually agreeable for OOB to recliner today.  Pt remained on 2L O2 [place] during session.    Recommendations for follow up therapy are one component of a multi-disciplinary discharge planning process, led by the attending physician.  Recommendations may be updated based on patient status, additional functional criteria and insurance authorization.  Follow Up Recommendations  No PT follow up     Assistance Recommended at Discharge PRN  Equipment Recommendations       Recommendations for Other Services       Precautions / Restrictions Precautions Precautions: Fall Precaution Comments: monitor sats     Mobility  Bed Mobility Overal bed mobility: Needs Assistance Bed Mobility: Supine to Sit     Supine to sit: Min assist     General bed mobility comments: pt requiring encouragement, provided light assist for trunk upright to initiate mobility however pt able to perform Mod I previous session    Transfers Overall transfer level: Needs assistance Equipment used: Rolling walker (2 wheels)   Sit to Stand: Min guard;Supervision Stand pivot transfers: Min guard;Supervision         General transfer comment: pt reports fear of falling, min/guard for safety however  pt not requiring physical assist, maintained 2L O2 [place]    Ambulation/Gait               General Gait Details: pt refused   Stairs             Wheelchair Mobility    Modified Rankin (Stroke Patients Only)       Balance Overall balance assessment: Needs assistance         Standing balance support: Bilateral upper extremity supported;During functional activity;Reliant on assistive device for balance                                Cognition Arousal/Alertness: Awake/alert Behavior During Therapy: WFL for tasks assessed/performed Overall Cognitive Status: Within Functional Limits for tasks assessed                                 General Comments: pt very self limiting        Exercises      General Comments        Pertinent Vitals/Pain Pain Assessment: Faces Faces Pain Scale: Hurts little more Pain Location: chest Pain Descriptors / Indicators: Aching;Sore Pain Intervention(s): Repositioned;Monitored during session    Home Living                          Prior Function            PT Goals (current goals can now be found in the care plan  section) Progress towards PT goals: Progressing toward goals    Frequency    Min 2X/week      PT Plan Current plan remains appropriate;Frequency needs to be updated    Co-evaluation              AM-PAC PT "6 Clicks" Mobility   Outcome Measure  Help needed turning from your back to your side while in a flat bed without using bedrails?: None Help needed moving from lying on your back to sitting on the side of a flat bed without using bedrails?: None Help needed moving to and from a bed to a chair (including a wheelchair)?: A Little Help needed standing up from a chair using your arms (e.g., wheelchair or bedside chair)?: A Little Help needed to walk in hospital room?: A Little Help needed climbing 3-5 steps with a railing? : A Little 6 Click Score: 20     End of Session Equipment Utilized During Treatment: Oxygen Activity Tolerance: Patient tolerated treatment well Patient left: in chair;with call bell/phone within reach;with chair alarm set Nurse Communication: Mobility status PT Visit Diagnosis: Other abnormalities of gait and mobility (R26.89)     Time: 1505-6979 PT Time Calculation (min) (ACUTE ONLY): 9 min  Charges:  $Therapeutic Activity: 8-22 mins                     Jannette Spanner PT, DPT Acute Rehabilitation Services Pager: [phone] Office: [place] 11/17/2020, 4:00 PM

## 2020-11-17 NOTE — Progress Notes (Signed)
   11/17/20 1143  Mobility  Activity Off unit  Mobility Sit up in bed/chair position for meals   Per RN, pt just left for procedure. Will check back as schedule permits.   [place] Specialist Acute Rehab Services Office: [phone]
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
## 2020-11-17 NOTE — Progress Notes (Signed)
Request to IR for diagnostic and therapeutic thoracentesis based on CT chest/abdomen/pelvis w/o contrast yesterday showing moderate right pleural effusion improved from previous and stable small left pleural effusion. Per patient she has been experiencing worsening right sided chest pain with inspiration and is requesting pain medications.  Limited chest US shows small left pleural effusion which is not amenable to safe thoracentesis as well as significant consolidation of the right lung with no identifiable pocket of free fluid which is amenable to thoracentesis. Imaging reviewed by IR attending who confirms above findings.  No procedure performed today, imaging results explained to patient who states understanding. Ordering provider messaged via [place].  Please see imaging section of Epic for images and dictation.  Candiss Norse, PA-C

## 2020-11-18 ENCOUNTER — Telehealth: Payer: Self-pay | Admitting: Physician Assistant

## 2020-11-18 DIAGNOSIS — Z22322 Carrier or suspected carrier of Methicillin resistant Staphylococcus aureus: Secondary | ICD-10-CM

## 2020-11-18 DIAGNOSIS — J9601 Acute respiratory failure with hypoxia: Secondary | ICD-10-CM

## 2020-11-18 DIAGNOSIS — R0781 Pleurodynia: Secondary | ICD-10-CM | POA: Diagnosis not present

## 2020-11-18 DIAGNOSIS — J189 Pneumonia, unspecified organism: Secondary | ICD-10-CM

## 2020-11-18 DIAGNOSIS — J432 Centrilobular emphysema: Secondary | ICD-10-CM

## 2020-11-18 DIAGNOSIS — Z9889 Other specified postprocedural states: Secondary | ICD-10-CM | POA: Diagnosis not present

## 2020-11-18 DIAGNOSIS — J90 Pleural effusion, not elsewhere classified: Secondary | ICD-10-CM | POA: Diagnosis not present

## 2020-11-18 HISTORY — DX: Carrier or suspected carrier of methicillin resistant Staphylococcus aureus: Z22.322

## 2020-11-18 LAB — CBC
HCT: 31.8 % — ABNORMAL LOW (ref 36.0–46.0)
Hemoglobin: 10.2 g/dL — ABNORMAL LOW (ref 12.0–15.0)
MCH: 29.3 pg (ref 26.0–34.0)
MCHC: 32.1 g/dL (ref 30.0–36.0)
MCV: 91.4 fL (ref 80.0–100.0)
Platelets: 531 10*3/uL — ABNORMAL HIGH (ref 150–400)
RBC: 3.48 MIL/uL — ABNORMAL LOW (ref 3.87–5.11)
RDW: 14.3 % (ref 11.5–15.5)
WBC: 7.3 10*3/uL (ref 4.0–10.5)
nRBC: 0 % (ref 0.0–0.2)

## 2020-11-18 LAB — COMPREHENSIVE METABOLIC PANEL
ALT: 7 U/L (ref 0–44)
AST: 14 U/L — ABNORMAL LOW (ref 15–41)
Albumin: 2.1 g/dL — ABNORMAL LOW (ref 3.5–5.0)
Alkaline Phosphatase: 70 U/L (ref 38–126)
Anion gap: 8 (ref 5–15)
BUN: 8 mg/dL (ref 8–23)
CO2: 24 mmol/L (ref 22–32)
Calcium: 8.3 mg/dL — ABNORMAL LOW (ref 8.9–10.3)
Chloride: 99 mmol/L (ref 98–111)
Creatinine, Ser: 0.66 mg/dL (ref 0.44–1.00)
GFR, Estimated: >60 mL/min (ref >60)
Glucose, Bld: 105 mg/dL — ABNORMAL HIGH (ref 70–99)
Potassium: 3.9 mmol/L (ref 3.5–5.1)
Sodium: 131 mmol/L — ABNORMAL LOW (ref 135–145)
Total Bilirubin: 0.4 mg/dL (ref 0.3–1.2)
Total Protein: 7.1 g/dL (ref 6.5–8.1)

## 2020-11-18 LAB — PROCALCITONIN: Procalcitonin: 0.16 ng/mL

## 2020-11-18 LAB — MRSA NEXT GEN BY PCR, NASAL: MRSA by PCR Next Gen: DETECTED — AB

## 2020-11-18 MED ORDER — IBUPROFEN 200 MG PO TABS
400.0000 mg | ORAL_TABLET | Freq: Three times a day (TID) | ORAL | Status: AC
  Administered 2020-11-18 – 2020-11-19 (×3): 400 mg via ORAL
  Filled 2020-11-18 (×3): qty 2

## 2020-11-18 MED ORDER — PIPERACILLIN-TAZOBACTAM 3.375 G IVPB
3.3750 g | Freq: Three times a day (TID) | INTRAVENOUS | Status: DC
  Administered 2020-11-18 – 2020-11-19 (×3): 3.375 g via INTRAVENOUS
  Filled 2020-11-18 (×3): qty 50

## 2020-11-18 MED ORDER — UMECLIDINIUM-VILANTEROL 62.5-25 MCG/ACT IN AEPB
1.0000 | INHALATION_SPRAY | Freq: Every day | RESPIRATORY_TRACT | Status: DC
  Filled 2020-11-18: qty 14

## 2020-11-18 MED ORDER — UMECLIDINIUM-VILANTEROL 62.5-25 MCG/ACT IN AEPB
1.0000 | INHALATION_SPRAY | Freq: Every day | RESPIRATORY_TRACT | Status: DC
  Administered 2020-11-19 – 2020-11-24 (×6): 1 via RESPIRATORY_TRACT

## 2020-11-18 MED ORDER — LIDOCAINE 5 % EX PTCH
1.0000 | MEDICATED_PATCH | CUTANEOUS | Status: DC
  Administered 2020-11-18 – 2020-11-23 (×6): 1 via TRANSDERMAL
  Filled 2020-11-18 (×7): qty 1

## 2020-11-18 NOTE — Progress Notes (Signed)
[place] for Infectious Disease  Date of Admission:  11/11/2020   Total days of antibiotics 17  Active Problems:   Dyspnea on exertion   Pleural effusion          Assessment: 65 year old female with recent admission for perforated appendicitis status post lap appendectomy and drain placement with E. coli and B fragilis bacteremia discharged on cefadroxil and metronidazole(EOT 11/10, ).  She was readmitted due to shortness of breath and found to have right pleural effusion status post thoracentesis.  #Right lung consolidation with exudative effusion #Intraabdominal abscess - CT on admission showed large right pleural effusion(increased in size). Thoracentesis on 11/1: Cx negative. Ct on 11/5 showed right sided consolidation/atelectasis, 2.7x3.1 cm retrocecal phlegmonous density not significantly changed. Id was re-engaged. -Do not think this is a bug drug mismatch. Suspect effusion likely 2/2 perforation/abscess. As such would continue antibiotics: cefodroxil(Ecoli) and metronidazole(B.fragilis). There is  a consolidation on imaging as such would get pulmonology involved for possible chest tube. Of note pt appeared comfortable on 3L O2.      Recommendations: -Continue cefodroxil 1gm PO bid and metronidazole  -Engage pulmonology for further management    Microbiology  Antibiotics: Cefadroxil 10/31-present Metronidazole 10/23-present Cefazolin 10/28-10/31 CTX 10/23--10/27 Cefepime 10/23  Cultures: 11/1 Pleural fluid Cx: no growth 10.23 1/2 blood Cx ecoli, B fragilis(beta lactamase positive) SUBJECTIVE: Pt resting in bed. Afebrile overnight. Reports that she feels better and that her [facility] has improved since hospitalization.   Review of Systems: Review of Systems  Constitutional: Negative.   HENT: Negative.    Eyes: Negative.   Respiratory:         SHOB improving  Cardiovascular: Negative.   Gastrointestinal:        Abdomen tender  Genitourinary:  Negative.   Musculoskeletal: Negative.   Skin: Negative.   Neurological: Negative.   Endo/Heme/Allergies: Negative.   Psychiatric/Behavioral: Negative.      Scheduled Meds:  amitriptyline  50 mg Oral QHS   amLODipine  10 mg Oral q morning   atorvastatin  40 mg Oral QHS   cefadroxil  1,000 mg Oral BID   docusate sodium  100 mg Oral BID   enoxaparin (LOVENOX) injection  40 mg Subcutaneous Q24H   gabapentin  100 mg Oral BID   linaclotide  145 mcg Oral QAC breakfast   metroNIDAZOLE  500 mg Oral Q12H   mometasone-formoterol  2 puff Inhalation BID   pantoprazole  40 mg Oral q morning   polycarbophil  625 mg Oral Daily   polyethylene glycol  17 g Oral Daily   senna  1 tablet Oral Daily   Continuous Infusions: PRN Meds:.acetaminophen **OR** acetaminophen, albuterol, diphenhydrAMINE, fentaNYL (SUBLIMAZE) injection, Muscle Rub, oxyCODONE Allergies  Allergen Reactions   Iohexol Anaphylaxis   Iodine-131 Other (See Comments)     Neck tightens up   Levofloxacin Other (See Comments)    BASELINE PROLONGED QTc.     Zofran [Ondansetron Hcl] Other (See Comments)    BASELINE PROLONGED QTc.   Heparin Itching and Other (See Comments)    Pt is able to tolerate IV heparin, but not sub-Q.   Morphine And Related Itching   Penicillins Itching    Has patient had a PCN reaction causing immediate rash, facial/tongue/throat swelling, SOB or lightheadedness with hypotension: Yes Has patient had a PCN reaction causing severe rash involving mucus membranes or skin necrosis: No Has patient had a PCN reaction that required hospitalization: No Has patient had a  PCN reaction occurring within the last 10 years: Yes If all of the above answers are "NO", then may proceed with Cephalosporin use.      OBJECTIVE: Vitals:   11/17/20 0912 11/17/20 1938 11/17/20 2028 11/18/20 0456  BP: 121/70  (!) 113/59 117/72  Pulse:   89 82  Resp:   15 16  Temp:   98.3 F (36.8 C) 98.1 F (36.7 C)  TempSrc:   Oral Oral   SpO2:  95% 100% 91%  Weight:      Height:       Body mass index is 23.98 kg/m.  Physical Exam Constitutional:      Appearance: Normal appearance.     Comments: ON 3L O2  HENT:     Head: Normocephalic and atraumatic.     Right Ear: Tympanic membrane normal.     Left Ear: Tympanic membrane normal.     Nose: Nose normal.     Mouth/Throat:     Mouth: Mucous membranes are moist.  Eyes:     Extraocular Movements: Extraocular movements intact.     Conjunctiva/sclera: Conjunctivae normal.     Pupils: Pupils are equal, round, and reactive to light.  Cardiovascular:     Rate and Rhythm: Normal rate and regular rhythm.     Heart sounds: No murmur heard.   No friction rub. No gallop.  Pulmonary:     Effort: Pulmonary effort is normal.     Breath sounds: Normal breath sounds.  Abdominal:     General: Abdomen is flat.     Palpations: Abdomen is soft.  Musculoskeletal:        General: Normal range of motion.  Skin:    General: Skin is warm and dry.  Neurological:     General: No focal deficit present.     Mental Status: She is alert and oriented to person, place, and time.  Psychiatric:        Mood and Affect: Mood normal.      Lab Results Lab Results  Component Value Date   WBC 7.3 11/18/2020   HGB 10.2 (L) 11/18/2020   HCT 31.8 (L) 11/18/2020   MCV 91.4 11/18/2020   PLT 531 (H) 11/18/2020    Lab Results  Component Value Date   CREATININE 0.66 11/18/2020   BUN 8 11/18/2020   NA 131 (L) 11/18/2020   K 3.9 11/18/2020   CL 99 11/18/2020   CO2 24 11/18/2020    Lab Results  Component Value Date   ALT 7 11/18/2020   AST 14 (L) 11/18/2020   ALKPHOS 70 11/18/2020   BILITOT 0.4 11/18/2020        Laurice Record, [place] [facility] 11/18/2020, 5:58 AM

## 2020-11-18 NOTE — Progress Notes (Signed)
Pharmacy Antibiotic Note  [name] is a 65 y.o. female admitted on 11/11/2020 with pleural effusion.  Pharmacy has been consulted for Zosyn dosing to cover bacteremia from recent perforated appendicitis with bacteremia and now possible pulmonary infection.  Plan: Zosyn 3.375g IV q8h (4 hour infusion). No dose adjustments needed, Pharmacy will sign off note writing, and f/u renal function peripherally  Height: 5\' 6"  (167.6 cm) Weight: 67.4 kg (148 lb 9.4 oz) IBW/kg (Calculated) : 59.3  Temp (24hrs), Avg:98 F (36.7 C), Min:97.6 F (36.4 C), Max:98.3 F (36.8 C)  Recent Labs  Lab 11/12/20 0729 11/14/20 0425 11/16/20 0517 11/17/20 0424 11/18/20 0431  WBC 11.4*  --  8.4 7.5 7.3  CREATININE 0.74 0.77 0.62 0.68 0.66    Estimated Creatinine Clearance: 64.8 mL/min (by C-G formula based on SCr of 0.66 mg/dL).    Allergies  Allergen Reactions   Iohexol Anaphylaxis   Iodine-131 Other (See Comments)     Neck tightens up   Levofloxacin Other (See Comments)    BASELINE PROLONGED QTc.     Zofran [Ondansetron Hcl] Other (See Comments)    BASELINE PROLONGED QTc.   Heparin Itching and Other (See Comments)    Pt is able to tolerate IV heparin, but not sub-Q.   Morphine And Related Itching   Penicillins Itching    Has patient had a PCN reaction causing immediate rash, facial/tongue/throat swelling, SOB or lightheadedness with hypotension: Yes Has patient had a PCN reaction causing severe rash involving mucus membranes or skin necrosis: No Has patient had a PCN reaction that required hospitalization: No Has patient had a PCN reaction occurring within the last 10 years: Yes If all of the above answers are "NO", then may proceed with Cephalosporin use.      Antimicrobials this admission: 10/23 cefepime x 1 10/23 CTX>>10/27 10/28 cefazolin>>10/31 11/1 cefadroxil>> 11/8 10/23 Flagyl >> 11/8 11/8 Zosyn >>  Dose adjustments this admission: None  Microbiology results: 10/23  BCx: E.coli, Bacteroides fragilis 11/1 Pleural fluid: NGF  Thank you for allowing pharmacy to be a part of this patient's care.  Peggyann Juba, PharmD, BCPS Pharmacy: [phone] 11/18/2020 5:26 PM

## 2020-11-18 NOTE — Plan of Care (Signed)
  Problem: Health Behavior/Discharge Planning: Goal: Ability to manage health-related needs will improve Outcome: Progressing   Problem: Clinical Measurements: Goal: Will remain free from infection Outcome: Progressing   Problem: Coping: Goal: Level of anxiety will decrease Outcome: Progressing

## 2020-11-18 NOTE — Care Management Important Message (Signed)
Important Message  Patient Details IM Letter given to the Patient. Name: [name] MRN: [id number] Date of Birth: 04-Nov-1955   Medicare Important Message Given:  Yes     Kerin Salen 11/18/2020, 10:57 AM

## 2020-11-18 NOTE — Consult Note (Deleted)
Duplicate note entered in error

## 2020-11-18 NOTE — Progress Notes (Signed)
PROGRESS NOTE    [name]  BVQ:[id number] DOB: 1955-10-12 DOA: 11/11/2020 PCP: Nolene Ebbs, MD    Brief Narrative:  65 y.o. female with medical history significant of HTN, HLD, GERD, recent perforated appendicitis s/p lap appy and drain placement. Presenting with abdominal pain and shortness of breath. She was discharged yesterday from [facility] after presenting with a perforated appendix and e coli bacteremia. She had a lap appy performed. A drain was placed after washout. She was started on abx for e coli. She improved and was discharged home on an abx regimen to continue for an additional 8 days. At the time she was hypoxic on 4L [place]. She was discharged to home on O2 and IS. She reports last night she was short of breath and she felt pain all over. Work up demonstrated large pleural effusion, now s/p thoracentesis yielding exudative effusion  Assessment & Plan:   Active Problems:   Dyspnea on exertion   Pleural effusion  Acute hypoxemic respiratory failure secondary to Right pleural effusion     -Patient was recently discharged requiring 4 L nasal cannula at time of discharge, patient is O2 nave  -Patient now status post thoracentesis, fluid analysis reviewed, findings suggestive of exudative effusion,, cultures thus far negative     - continue IS     - d-dimer elevated, but VQ is negative     -Id had recently recommended to continue antibiotics below. ID signed off -On 11/5, pt noted to have increased coughing. Ordered and reviewed CXR with radiologist. Findings of increasing R sided pleural effusion -CT chest reviewed, findings of moderate R effusion with.  -Discussed with interventional radiology.  Patient underwent attempt at right thoracentesis.  Per radiology, notable consolidation is seen instead of drainable fluid and thoracentesis not performed. -Given rapidly developing consolidation despite current antibiotics, had consulted ID for recs, pending   Abdominal pain with  recent perforated appendicitis status post laparoscopic appendectomy with washout and drain placement -General surgery following -Patient is continued on oral cefadroxil and Flagyl -Drain since pulled per general surgery, general surgery has since signed off -Pt continues with some R sided pain. Repeat CT abd w/o contrast reviewed. Findings of pleural effusion above. Otherwise no significant intra-abdominal changes from prior study -Continue with analgesia as needed   HTN     - continue on home regimen as pt tolerates   GERD     - cont with PPI as tolerated   HLD     - continue home regimen   Tobacco abuse     -Cessation done at time of presentation   Hyponatremia     -Sodium remains stable -Serum osmole's reviewed, low, urine sodium is high at 110.  Suggestive of SIADH -Sodium remains stable at 131 today.  Will ensure patient is on a 1200 cc fluid restricted diet. -Repeat bmet in AM   DVT prophylaxis: Lovenox subq Code Status: Full Family Communication: Pt in room  Status is: Inpatient  Remains inpatient secondary to severity of illness   Consultants:  ID General Surgery  Procedures:    Antimicrobials: Anti-infectives (From admission, onward)    Start     Dose/Rate Route Frequency Ordered Stop   11/12/20 1000  cefadroxil (DURICEF) capsule 1,000 mg  Status:  Discontinued        1,000 mg Oral 2 times daily 11/12/20 0823 11/12/20 0830   11/12/20 0915  metroNIDAZOLE (FLAGYL) tablet 500 mg  Status:  Discontinued        500 mg Oral  Every 8 hours 11/12/20 0823 11/12/20 0832   11/11/20 2200  cefadroxil (DURICEF) capsule 1,000 mg        1,000 mg Oral 2 times daily 11/11/20 1626     11/11/20 2200  metroNIDAZOLE (FLAGYL) tablet 500 mg        500 mg Oral Every 12 hours 11/11/20 1626         Subjective: Reports increased sputum production, subjective sob  Objective: Vitals:   11/18/20 0456 11/18/20 0807 11/18/20 0903 11/18/20 1254  BP: 117/72  130/76 119/71  Pulse:  82   87  Resp: 16   20  Temp: 98.1 F (36.7 C)   97.6 F (36.4 C)  TempSrc: Oral   Oral  SpO2: 91% 96%  95%  Weight:      Height:        Intake/Output Summary (Last 24 hours) at 11/18/2020 1303 Last data filed at 11/18/2020 0700 Gross per 24 hour  Intake 120 ml  Output 1050 ml  Net -930 ml    Filed Weights   11/11/20 2100  Weight: 67.4 kg    Examination: General exam: Awake, laying in bed, in nad Respiratory system: coughing, normal rep effort, no wheezing Cardiovascular system: regular rate, s1, s2 Gastrointestinal system: Soft, nondistended, positive BS Central nervous system: CN2-12 grossly intact, strength intact Extremities: Perfused, no clubbing Skin: Normal skin turgor, no notable skin lesions seen Psychiatry: Mood normal // no visual hallucinations   Data Reviewed: I have personally reviewed following labs and imaging studies  CBC: Recent Labs  Lab 11/12/20 0729 11/16/20 0517 11/17/20 0424 11/18/20 0431  WBC 11.4* 8.4 7.5 7.3  HGB 10.8* 9.9* 10.0* 10.2*  HCT 32.9* 31.1* 30.8* 31.8*  MCV 90.1 91.2 90.6 91.4  PLT 589* 582* 564* 531*    Basic Metabolic Panel: Recent Labs  Lab 11/12/20 0729 11/14/20 0425 11/16/20 0517 11/17/20 0424 11/18/20 0431  NA 130* 131* 130* 130* 131*  K 4.1 4.2 4.2 4.3 3.9  CL 98 100 97* 99 99  CO2 28 25 26 25 24   GLUCOSE 104* 109* 98 100* 105*  BUN 7* 7* 8 7* 8  CREATININE 0.74 0.77 0.62 0.68 0.66  CALCIUM 7.8* 7.9* 7.9* 8.2* 8.3*  MG  --  1.7  --   --   --     GFR: Estimated Creatinine Clearance: 64.8 mL/min (by C-G formula based on SCr of 0.66 mg/dL). Liver Function Tests: Recent Labs  Lab 11/12/20 0729 11/14/20 0425 11/16/20 0517 11/17/20 0424 11/18/20 0431  AST 21 18 14* 15 14*  ALT 7 8 8 7 7   ALKPHOS 84 72 68 74 70  BILITOT 0.5 0.7 0.4 0.5 0.4  PROT 6.3* 6.5 6.7 6.9 7.1  ALBUMIN 2.0* 2.2* 2.0* 2.2* 2.1*    No results for input(s): LIPASE, AMYLASE in the last 168 hours.  No results for input(s):  AMMONIA in the last 168 hours. Coagulation Profile: No results for input(s): INR, PROTIME in the last 168 hours. Cardiac Enzymes: No results for input(s): CKTOTAL, CKMB, CKMBINDEX, TROPONINI in the last 168 hours. BNP (last 3 results) No results for input(s): PROBNP in the last 8760 hours. HbA1C: No results for input(s): HGBA1C in the last 72 hours. CBG: No results for input(s): GLUCAP in the last 168 hours. Lipid Profile: No results for input(s): CHOL, HDL, LDLCALC, TRIG, CHOLHDL, LDLDIRECT in the last 72 hours. Thyroid Function Tests: No results for input(s): TSH, T4TOTAL, FREET4, T3FREE, THYROIDAB in the last 72 hours. Anemia Panel: No  results for input(s): VITAMINB12, FOLATE, FERRITIN, TIBC, IRON, RETICCTPCT in the last 72 hours. Sepsis Labs: No results for input(s): PROCALCITON, LATICACIDVEN in the last 168 hours.   Recent Results (from the past 240 hour(s))  Resp Panel by RT-PCR (Flu A&B, Covid) Nasopharyngeal Swab     Status: None   Collection Time: 11/11/20 11:08 AM   Specimen: Nasopharyngeal Swab; Nasopharyngeal(NP) swabs in vial transport medium  Result Value Ref Range Status   SARS Coronavirus 2 by RT PCR NEGATIVE NEGATIVE Final    Comment: (NOTE) SARS-CoV-2 target nucleic acids are NOT DETECTED.  The SARS-CoV-2 RNA is generally detectable in upper respiratory specimens during the acute phase of infection. The lowest concentration of SARS-CoV-2 viral copies this assay can detect is 138 copies/mL. A negative result does not preclude SARS-Cov-2 infection and should not be used as the sole basis for treatment or other patient management decisions. A negative result may occur with  improper specimen collection/handling, submission of specimen other than nasopharyngeal swab, presence of viral mutation(s) within the areas targeted by this assay, and inadequate number of viral copies(<138 copies/mL). A negative result must be combined with clinical observations, patient  history, and epidemiological information. The expected result is Negative.  Fact Sheet for Patients:  EntrepreneurPulse.com.au  Fact Sheet for Healthcare Providers:  IncredibleEmployment.be  This test is no t yet approved or cleared by the [place] FDA and  has been authorized for detection and/or diagnosis of SARS-CoV-2 by FDA under an Emergency Use Authorization (EUA). This EUA will remain  in effect (meaning this test can be used) for the duration of the COVID-19 declaration under Section 564(b)(1) of the Act, 21 U.S.C.section 360bbb-3(b)(1), unless the authorization is terminated  or revoked sooner.       Influenza A by PCR NEGATIVE NEGATIVE Final   Influenza B by PCR NEGATIVE NEGATIVE Final    Comment: (NOTE) The Xpert Xpress SARS-CoV-2/FLU/RSV plus assay is intended as an aid in the diagnosis of influenza from Nasopharyngeal swab specimens and should not be used as a sole basis for treatment. Nasal washings and aspirates are unacceptable for Xpert Xpress SARS-CoV-2/FLU/RSV testing.  Fact Sheet for Patients: EntrepreneurPulse.com.au  Fact Sheet for Healthcare Providers: IncredibleEmployment.be  This test is not yet approved or cleared by the [place] FDA and has been authorized for detection and/or diagnosis of SARS-CoV-2 by FDA under an Emergency Use Authorization (EUA). This EUA will remain in effect (meaning this test can be used) for the duration of the COVID-19 declaration under Section 564(b)(1) of the Act, 21 U.S.C. section 360bbb-3(b)(1), unless the authorization is terminated or revoked.  Performed at [facility], [place] [address], [place], [place] [address]   Culture, body fluid w Gram Stain-bottle     Status: None   Collection Time: 11/11/20 12:27 PM   Specimen: Pleura  Result Value Ref Range Status   Specimen Description PLEURAL  Final   Special Requests  NONE  Final   Culture   Final    NO GROWTH 5 DAYS Performed at [facility], [address] [place], [place] [address]    Report Status 11/17/2020 FINAL  Final  Gram stain     Status: None   Collection Time: 11/11/20 12:27 PM   Specimen: Pleura  Result Value Ref Range Status   Specimen Description PLEURAL  Final   Special Requests NONE  Final   Gram Stain   Final    CYTOSPIN SMEAR WBC PRESENT,BOTH PMN AND MONONUCLEAR NO ORGANISMS  SEEN Performed at [facility], [place] [address], [place], [place] [address]    Report Status 11/14/2020 FINAL  Final      Radiology Studies: [place] CHEST (PLEURAL EFFUSION)  Result Date: 11/18/2020 CLINICAL DATA:  Pleural effusion. Request for diagnostic thoracentesis. EXAM: ULTRASOUND OF CHEST, LIMITED TECHNIQUE: Ultrasound examination of the chest was performed in the area of clinical concern, to assess for feasibility of diagnostic thoracentesis. COMPARISON:  CT chest, 11/15/2020.  Chest radiograph, 11/15/2020. FINDINGS: Heterogeneously echogenic RIGHT basilar chest consolidation without discrete fluid collection. Multiple sonographic images were obtained and saved to PACS. Thoracentesis was aborted. IMPRESSION: Heterogeneously echogenic RIGHT basilar chest consolidation without discrete fluid collection. Thoracentesis was aborted. Michaelle Birks, MD [facility] Electronically Signed   By: Michaelle Birks M.D.   On: 11/18/2020 11:16    Scheduled Meds:  amitriptyline  50 mg Oral QHS   amLODipine  10 mg Oral q morning   atorvastatin  40 mg Oral QHS   cefadroxil  1,000 mg Oral BID   docusate sodium  100 mg Oral BID   enoxaparin (LOVENOX) injection  40 mg Subcutaneous Q24H   gabapentin  100 mg Oral BID   linaclotide  145 mcg Oral QAC breakfast   metroNIDAZOLE  500 mg Oral Q12H   mometasone-formoterol  2 puff Inhalation BID   pantoprazole  40 mg Oral q morning   polycarbophil  625 mg Oral Daily    polyethylene glycol  17 g Oral Daily   senna  1 tablet Oral Daily   Continuous Infusions:   LOS: 6 days   Marylu Lund, MD [facility] Pager On Amion  If 7PM-7AM, please contact night-coverage 11/18/2020, 1:03 PM
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
## 2020-11-19 DIAGNOSIS — R0902 Hypoxemia: Secondary | ICD-10-CM | POA: Diagnosis not present

## 2020-11-19 DIAGNOSIS — R0609 Other forms of dyspnea: Secondary | ICD-10-CM | POA: Diagnosis not present

## 2020-11-19 DIAGNOSIS — Z9889 Other specified postprocedural states: Secondary | ICD-10-CM | POA: Diagnosis not present

## 2020-11-19 DIAGNOSIS — J90 Pleural effusion, not elsewhere classified: Secondary | ICD-10-CM | POA: Diagnosis not present

## 2020-11-19 LAB — CBC
HCT: 31.3 % — ABNORMAL LOW (ref 36.0–46.0)
Hemoglobin: 10.1 g/dL — ABNORMAL LOW (ref 12.0–15.0)
MCH: 29.5 pg (ref 26.0–34.0)
MCHC: 32.3 g/dL (ref 30.0–36.0)
MCV: 91.5 fL (ref 80.0–100.0)
Platelets: 554 10*3/uL — ABNORMAL HIGH (ref 150–400)
RBC: 3.42 MIL/uL — ABNORMAL LOW (ref 3.87–5.11)
RDW: 14.3 % (ref 11.5–15.5)
WBC: 7.5 10*3/uL (ref 4.0–10.5)
nRBC: 0 % (ref 0.0–0.2)

## 2020-11-19 LAB — COMPREHENSIVE METABOLIC PANEL
ALT: 8 U/L (ref 0–44)
AST: 19 U/L (ref 15–41)
Albumin: 2.3 g/dL — ABNORMAL LOW (ref 3.5–5.0)
Alkaline Phosphatase: 96 U/L (ref 38–126)
Anion gap: 8 (ref 5–15)
BUN: 10 mg/dL (ref 8–23)
CO2: 25 mmol/L (ref 22–32)
Calcium: 8.2 mg/dL — ABNORMAL LOW (ref 8.9–10.3)
Chloride: 101 mmol/L (ref 98–111)
Creatinine, Ser: 0.92 mg/dL (ref 0.44–1.00)
GFR, Estimated: >60 mL/min (ref >60)
Glucose, Bld: 121 mg/dL — ABNORMAL HIGH (ref 70–99)
Potassium: 3.9 mmol/L (ref 3.5–5.1)
Sodium: 134 mmol/L — ABNORMAL LOW (ref 135–145)
Total Bilirubin: 0.5 mg/dL (ref 0.3–1.2)
Total Protein: 7.6 g/dL (ref 6.5–8.1)

## 2020-11-19 LAB — PROCALCITONIN: Procalcitonin: 0.15 ng/mL

## 2020-11-19 MED ORDER — VANCOMYCIN HCL 1250 MG/250ML IV SOLN
1250.0000 mg | INTRAVENOUS | Status: DC
  Administered 2020-11-19 – 2020-11-23 (×5): 1250 mg via INTRAVENOUS
  Filled 2020-11-19 (×5): qty 250

## 2020-11-19 MED ORDER — LIP MEDEX EX OINT: TOPICAL_OINTMENT | CUTANEOUS | Status: DC | PRN

## 2020-11-19 MED ORDER — METRONIDAZOLE 500 MG/100ML IV SOLN
500.0000 mg | Freq: Two times a day (BID) | INTRAVENOUS | Status: DC
  Administered 2020-11-19 – 2020-11-20 (×2): 500 mg via INTRAVENOUS
  Filled 2020-11-19 (×3): qty 100

## 2020-11-19 MED ORDER — MUPIROCIN 2 % EX OINT
1.0000 "application " | TOPICAL_OINTMENT | Freq: Two times a day (BID) | CUTANEOUS | Status: AC
  Administered 2020-11-19 – 2020-11-23 (×10): 1 via NASAL
  Filled 2020-11-19 (×2): qty 22

## 2020-11-19 MED ORDER — SODIUM CHLORIDE 0.9 % IV SOLN
2.0000 g | Freq: Two times a day (BID) | INTRAVENOUS | Status: DC
  Administered 2020-11-19 – 2020-11-20 (×2): 2 g via INTRAVENOUS
  Filled 2020-11-19 (×2): qty 2

## 2020-11-19 MED ORDER — CHLORHEXIDINE GLUCONATE CLOTH 2 % EX PADS
6.0000 | MEDICATED_PAD | Freq: Every day | CUTANEOUS | Status: AC
  Administered 2020-11-19 – 2020-11-23 (×5): 6 via TOPICAL

## 2020-11-19 NOTE — TOC Progression Note (Signed)
Transition of Care [facility]) - Progression Note    Patient Details  Name: Rada Zegers MRN: [id number] Date of Birth: Jun 30, 1955  Transition of [facility] CM/SW Contact  Seaver Machia, Juliann Pulse, RN Phone Number: 11/19/2020, 11:53 AM  Clinical Narrative:  [employer] rep for dme has already delivered home 02 travel tank to rm, & has given instructions to patient, & dtr Rolanda aware. Family has own transport home. Continue to monitor.     Expected Discharge Plan: Home/Self Care Barriers to Discharge: Continued Medical Work up  Expected Discharge Plan and Services Expected Discharge Plan: Home/Self Care   Discharge Planning Services: CM Consult   Living arrangements for the past 2 months: Single Family Home Expected Discharge Date:  (unknown)                                     Social Determinants of Health (SDOH) Interventions    Readmission Risk Interventions Readmission Risk Prevention Plan 11/13/2020 11/10/2020  Transportation Screening Complete Complete  PCP or Specialist Appt within 5-7 Days - Complete  PCP or Specialist Appt within 3-5 Days Complete -  Home Care Screening - Complete  Medication Review (RN CM) - Complete  HRI or Home Care Consult Complete -  Social Work Consult for Recovery Care Planning/Counseling Complete -  Palliative Care Screening Complete -  Medication Review Press photographer) Complete -  Some recent data might be hidden

## 2020-11-19 NOTE — Progress Notes (Signed)
PROGRESS NOTE    [name]  KXF:[id number] DOB: 1955-11-30 DOA: 11/11/2020 PCP: Nolene Ebbs, MD    Brief Narrative:  65 y.o. female with medical history significant of HTN, HLD, GERD, recent perforated appendicitis s/p lap appy and drain placement. Presenting with abdominal pain and shortness of breath. She was discharged yesterday from [facility] after presenting with a perforated appendix and e coli bacteremia. She had a lap appy performed. A drain was placed after washout. She was started on abx for e coli. She improved and was discharged home on an abx regimen to continue for an additional 8 days. At the time she was hypoxic on 4L [place]. She was discharged to home on O2 and IS. She reports last night she was short of breath and she felt pain all over. Work up demonstrated large pleural effusion, now s/p thoracentesis yielding exudative effusion  Assessment & Plan:   Active Problems:   Dyspnea on exertion   Pleural effusion  Acute hypoxemic respiratory failure secondary to Right pleural effusion     -Patient was recently discharged requiring 4 L nasal cannula at time of discharge, patient is O2 nave  -Patient now status post thoracentesis, fluid analysis reviewed, findings suggestive of exudative effusion,, cultures thus far negative     - continue IS     - d-dimer elevated, but VQ is negative     -Id had recently recommended to continue antibiotics below. ID signed off -On 11/5, pt noted to have increased coughing. Ordered and reviewed CXR with radiologist. Findings of increasing R sided pleural effusion -CT chest reviewed, findings of moderate R effusion with.  -Discussed with interventional radiology.  Patient underwent attempt at right thoracentesis.  Per radiology, notable consolidation is seen instead of drainable fluid and thoracentesis not performed. -Given rapidly developing consolidation despite current antibiotics, had consulted ID for recs -Started on broad-spectrum therapy  with cefepime, vancomycin, and Flagyl -Sputum culture requested   Abdominal pain with recent perforated appendicitis status post laparoscopic appendectomy with washout and drain placement -General surgery following -Patient completed appropriate course of antibiotics -Drain since pulled per general surgery, general surgery has since signed off -Pt continues with some R sided pain. Repeat CT abd w/o contrast reviewed. Findings of pleural effusion above. Otherwise no significant intra-abdominal changes from prior study -Continue with analgesia as needed   HTN     - continue on home regimen as pt tolerates   GERD     - cont with PPI as tolerated   HLD     - continue home regimen   Tobacco abuse     -Cessation done at time of presentation   Hyponatremia     -Sodium remains stable -Serum osmole's reviewed, low, urine sodium is high at 110.  Suggestive of SIADH -Sodium remains stable at 131 today.  Will ensure patient is on a 1200 cc fluid restricted diet. -Repeat bmet in AM   DVT prophylaxis: Lovenox subq Code Status: Full Family Communication: Pt in room  Status is: Inpatient  Remains inpatient secondary to severity of illness   Consultants:  ID General surgery Pulmonology  Procedures:    Antimicrobials: Anti-infectives (From admission, onward)    Start     Dose/Rate Route Frequency Ordered Stop   11/19/20 1600  metroNIDAZOLE (FLAGYL) IVPB 500 mg        500 mg 100 mL/hr over 60 Minutes Intravenous Every 12 hours 11/19/20 0908     11/19/20 1600  ceFEPIme (MAXIPIME) 2 g in sodium chloride 0.9 %  100 mL IVPB        2 g 200 mL/hr over 30 Minutes Intravenous Every 12 hours 11/19/20 0910     11/19/20 1000  vancomycin (VANCOREADY) IVPB 1250 mg/250 mL        1,250 mg 166.7 mL/hr over 90 Minutes Intravenous Every 24 hours 11/19/20 0901     11/18/20 1800  piperacillin-tazobactam (ZOSYN) IVPB 3.375 g  Status:  Discontinued        3.375 g 12.5 mL/hr over 240 Minutes  Intravenous Every 8 hours 11/18/20 1725 11/19/20 0908   11/12/20 1000  cefadroxil (DURICEF) capsule 1,000 mg  Status:  Discontinued        1,000 mg Oral 2 times daily 11/12/20 0823 11/12/20 0830   11/12/20 0915  metroNIDAZOLE (FLAGYL) tablet 500 mg  Status:  Discontinued        500 mg Oral Every 8 hours 11/12/20 0823 11/12/20 0832   11/11/20 2200  cefadroxil (DURICEF) capsule 1,000 mg  Status:  Discontinued        1,000 mg Oral 2 times daily 11/11/20 1626 11/18/20 1725   11/11/20 2200  metroNIDAZOLE (FLAGYL) tablet 500 mg  Status:  Discontinued        500 mg Oral Every 12 hours 11/11/20 1626 11/18/20 1725       Subjective: Feels short of breath, specifically on exertion.  Has productive cough.  Objective: Vitals:   11/19/20 0538 11/19/20 0903 11/19/20 1313 11/19/20 1957  BP: 118/76  115/71 111/67  Pulse: 83  86 92  Resp: 16  20 16   Temp: (!) 97.4 F (36.3 C)  97.8 F (36.6 C) 97.8 F (36.6 C)  TempSrc: Oral  Oral Oral  SpO2: 97% 97% 100% 92%  Weight:      Height:        Intake/Output Summary (Last 24 hours) at 11/19/2020 2011 Last data filed at 11/19/2020 1800 Gross per 24 hour  Intake 1218.1 ml  Output 301 ml  Net 917.1 ml   Filed Weights   11/11/20 2100  Weight: 67.4 kg    Examination: General exam: Awake, laying in bed, in nad Respiratory system: Diminished breath sounds at right base Cardiovascular system: regular rate, s1, s2 Gastrointestinal system: Soft, nondistended, positive BS Central nervous system: CN2-12 grossly intact, strength intact Extremities: Perfused, no clubbing Skin: Normal skin turgor, no notable skin lesions seen Psychiatry: Mood normal // no visual hallucinations   Data Reviewed: I have personally reviewed following labs and imaging studies  CBC: Recent Labs  Lab 11/16/20 0517 11/17/20 0424 11/18/20 0431 11/19/20 0455  WBC 8.4 7.5 7.3 7.5  HGB 9.9* 10.0* 10.2* 10.1*  HCT 31.1* 30.8* 31.8* 31.3*  MCV 91.2 90.6 91.4 91.5  PLT  582* 564* 531* 761*   Basic Metabolic Panel: Recent Labs  Lab 11/14/20 0425 11/16/20 0517 11/17/20 0424 11/18/20 0431 11/19/20 0455  NA 131* 130* 130* 131* 134*  K 4.2 4.2 4.3 3.9 3.9  CL 100 97* 99 99 101  CO2 25 26 25 24 25   GLUCOSE 109* 98 100* 105* 121*  BUN 7* 8 7* 8 10  CREATININE 0.77 0.62 0.68 0.66 0.92  CALCIUM 7.9* 7.9* 8.2* 8.3* 8.2*  MG 1.7  --   --   --   --    GFR: Estimated Creatinine Clearance: 56.3 mL/min (by C-G formula based on SCr of 0.92 mg/dL). Liver Function Tests: Recent Labs  Lab 11/14/20 0425 11/16/20 0517 11/17/20 0424 11/18/20 0431 11/19/20 0455  AST 18 14* 15  14* 19  ALT 8 8 7 7 8   ALKPHOS 72 68 74 70 96  BILITOT 0.7 0.4 0.5 0.4 0.5  PROT 6.5 6.7 6.9 7.1 7.6  ALBUMIN 2.2* 2.0* 2.2* 2.1* 2.3*   No results for input(s): LIPASE, AMYLASE in the last 168 hours.  No results for input(s): AMMONIA in the last 168 hours. Coagulation Profile: No results for input(s): INR, PROTIME in the last 168 hours. Cardiac Enzymes: No results for input(s): CKTOTAL, CKMB, CKMBINDEX, TROPONINI in the last 168 hours. BNP (last 3 results) No results for input(s): PROBNP in the last 8760 hours. HbA1C: No results for input(s): HGBA1C in the last 72 hours. CBG: No results for input(s): GLUCAP in the last 168 hours. Lipid Profile: No results for input(s): CHOL, HDL, LDLCALC, TRIG, CHOLHDL, LDLDIRECT in the last 72 hours. Thyroid Function Tests: No results for input(s): TSH, T4TOTAL, FREET4, T3FREE, THYROIDAB in the last 72 hours. Anemia Panel: No results for input(s): VITAMINB12, FOLATE, FERRITIN, TIBC, IRON, RETICCTPCT in the last 72 hours. Sepsis Labs: Recent Labs  Lab 11/18/20 1911 11/19/20 0455  PROCALCITON 0.16 0.15    Recent Results (from the past 240 hour(s))  Resp Panel by RT-PCR (Flu A&B, Covid) Nasopharyngeal Swab     Status: None   Collection Time: 11/11/20 11:08 AM   Specimen: Nasopharyngeal Swab; Nasopharyngeal(NP) swabs in vial transport  medium  Result Value Ref Range Status   SARS Coronavirus 2 by RT PCR NEGATIVE NEGATIVE Final    Comment: (NOTE) SARS-CoV-2 target nucleic acids are NOT DETECTED.  The SARS-CoV-2 RNA is generally detectable in upper respiratory specimens during the acute phase of infection. The lowest concentration of SARS-CoV-2 viral copies this assay can detect is 138 copies/mL. A negative result does not preclude SARS-Cov-2 infection and should not be used as the sole basis for treatment or other patient management decisions. A negative result may occur with  improper specimen collection/handling, submission of specimen other than nasopharyngeal swab, presence of viral mutation(s) within the areas targeted by this assay, and inadequate number of viral copies(<138 copies/mL). A negative result must be combined with clinical observations, patient history, and epidemiological information. The expected result is Negative.  Fact Sheet for Patients:  EntrepreneurPulse.com.au  Fact Sheet for Healthcare Providers:  IncredibleEmployment.be  This test is no t yet approved or cleared by the [place] FDA and  has been authorized for detection and/or diagnosis of SARS-CoV-2 by FDA under an Emergency Use Authorization (EUA). This EUA will remain  in effect (meaning this test can be used) for the duration of the COVID-19 declaration under Section 564(b)(1) of the Act, 21 U.S.C.section 360bbb-3(b)(1), unless the authorization is terminated  or revoked sooner.       Influenza A by PCR NEGATIVE NEGATIVE Final   Influenza B by PCR NEGATIVE NEGATIVE Final    Comment: (NOTE) The Xpert Xpress SARS-CoV-2/FLU/RSV plus assay is intended as an aid in the diagnosis of influenza from Nasopharyngeal swab specimens and should not be used as a sole basis for treatment. Nasal washings and aspirates are unacceptable for Xpert Xpress SARS-CoV-2/FLU/RSV testing.  Fact Sheet for  Patients: EntrepreneurPulse.com.au  Fact Sheet for Healthcare Providers: IncredibleEmployment.be  This test is not yet approved or cleared by the [place] FDA and has been authorized for detection and/or diagnosis of SARS-CoV-2 by FDA under an Emergency Use Authorization (EUA). This EUA will remain in effect (meaning this test can be used) for the duration of the COVID-19 declaration under Section 564(b)(1) of the Act,  21 U.S.C. section 360bbb-3(b)(1), unless the authorization is terminated or revoked.  Performed at [facility], [place] [address] [place], [place] [address]   Culture, body fluid w Gram Stain-bottle     Status: None   Collection Time: 11/11/20 12:27 PM   Specimen: Pleura  Result Value Ref Range Status   Specimen Description PLEURAL  Final   Special Requests NONE  Final   Culture   Final    NO GROWTH 5 DAYS Performed at [facility], [address] [place], [place] [address]    Report Status 11/17/2020 FINAL  Final  Gram stain     Status: None   Collection Time: 11/11/20 12:27 PM   Specimen: Pleura  Result Value Ref Range Status   Specimen Description PLEURAL  Final   Special Requests NONE  Final   Gram Stain   Final    CYTOSPIN SMEAR WBC PRESENT,BOTH PMN AND MONONUCLEAR NO ORGANISMS SEEN Performed at [facility], [address], [place], [place] [address]    Report Status 11/14/2020 FINAL  Final  MRSA Next Gen by PCR, Nasal     Status: Abnormal   Collection Time: 11/18/20  6:36 PM   Specimen: Nasal Mucosa; Nasal Swab  Result Value Ref Range Status   MRSA by PCR Next Gen DETECTED (A) NOT DETECTED Final    Comment: RESULT CALLED TO, READ BACK BY AND VERIFIED WITH: Ishmael Holter RN.@2115  ON 11.8.22 BY TCALDWELL MT. (NOTE) The GeneXpert MRSA Assay (FDA approved for NASAL specimens only), is one component of a comprehensive MRSA colonization surveillance program. It is not intended  to diagnose MRSA infection nor to guide or monitor treatment for MRSA infections. Test performance is not FDA approved in patients less than 90 years old. Performed at [facility], [place] [address], [place], [place] [address]       Radiology Studies: No results found.  Scheduled Meds:  amitriptyline  50 mg Oral QHS   amLODipine  10 mg Oral q morning   atorvastatin  40 mg Oral QHS   Chlorhexidine Gluconate Cloth  6 each Topical Q0600   docusate sodium  100 mg Oral BID   enoxaparin (LOVENOX) injection  40 mg Subcutaneous Q24H   gabapentin  100 mg Oral BID   lidocaine  1 patch Transdermal Q24H   linaclotide  145 mcg Oral QAC breakfast   mupirocin ointment  1 application Nasal BID   pantoprazole  40 mg Oral q morning   polycarbophil  625 mg Oral Daily   polyethylene glycol  17 g Oral Daily   senna  1 tablet Oral Daily   umeclidinium-vilanterol  1 puff Inhalation Daily   Continuous Infusions:  ceFEPime (MAXIPIME) IV 2 g (11/19/20 1745)   metronidazole 500 mg (11/19/20 1418)   vancomycin 1,250 mg (11/19/20 1011)     LOS: 7 days   Kathie Dike, MD [facility] Pager On [place]  If 7PM-7AM, please contact night-coverage 11/19/2020, 8:11 PM

## 2020-11-19 NOTE — Progress Notes (Signed)
[facility] for Infectious Disease  Date of Admission:  11/11/2020   Total days of antibiotics 17  Active Problems:   Dyspnea on exertion   Pleural effusion          Assessment: 65 year old female with recent admission for perforated appendicitis status post lap appendectomy and drain placement with E. coli and B fragilis bacteremia discharged on cefadroxil and metronidazole(EOT 11/10).  She was readmitted due to shortness of breath and found to have right pleural effusion status post thoracentesis.  #Right lung consolidation with exudative effusion #Intraabdominal abscess - CT on admission showed large right pleural effusion(increased in size). Thoracentesis on 11/1: Cx negative. Ct obtained on 11/5 due to increased coughing showed right sided consolidation/atelectasis, 2.7x3.1 cm retrocecal phlegmonous density not significantly changed. IR did not appreciate drainable fluid, thoracentesis not performed.. ID was re-engaged. - Pulmonology consulted and cefadroxil and metronidazole discontnued, pip-tazo started due to concern hospital acquired PNA. 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  In speaking with Pulmonology, no plans for intervention.  Recommendations: - There is worsening consolidation although 02 requirement was stable. I'm ok with treating broadly for HAP.  -Would transition pip-tazo->cefepime and metronidazole to avoid kidney injury. -Continue vancomycin for now(MRSA nares+) -Please obtain sputum Cx  to tailor antibiotic therapy.   Microbiology  Antibiotics: Cefadroxil 10/31-11/8 Metronidazole 10/23-11/8 Piptazo 11/8- Cefazolin 10/28-10/31 CTX 10/23--10/27 Cefepime 10/23  Cultures: 11/1 Pleural fluid Cx: no growth 10.23 1/2 blood Cx ecoli, B fragilis(beta lactamase positive) SUBJECTIVE: Pt resting in bed on RA. Afebrile overnight. No new complaints.   Review of Systems: Review of Systems  Constitutional: Negative.   HENT: Negative.    Eyes:  Negative.   Respiratory:         SHOB improving  Cardiovascular: Negative.   Gastrointestinal:        Abdomen tender  Genitourinary: Negative.   Musculoskeletal: Negative.   Skin: Negative.   Neurological: Negative.   Endo/Heme/Allergies: Negative.   Psychiatric/Behavioral: Negative.      Scheduled Meds:  amitriptyline  50 mg Oral QHS   amLODipine  10 mg Oral q morning   atorvastatin  40 mg Oral QHS   Chlorhexidine Gluconate Cloth  6 each Topical Q0600   docusate sodium  100 mg Oral BID   enoxaparin (LOVENOX) injection  40 mg Subcutaneous Q24H   gabapentin  100 mg Oral BID   ibuprofen  400 mg Oral Q8H   lidocaine  1 patch Transdermal Q24H   linaclotide  145 mcg Oral QAC breakfast   mupirocin ointment  1 application Nasal BID   pantoprazole  40 mg Oral q morning   polycarbophil  625 mg Oral Daily   polyethylene glycol  17 g Oral Daily   senna  1 tablet Oral Daily   umeclidinium-vilanterol  1 puff Inhalation Daily   Continuous Infusions:  piperacillin-tazobactam 3.375 g (11/19/20 0806)   PRN Meds:.acetaminophen **OR** acetaminophen, albuterol, diphenhydrAMINE, fentaNYL (SUBLIMAZE) injection, Muscle Rub, oxyCODONE Allergies  Allergen Reactions   Iohexol Anaphylaxis   Iodine-131 Other (See Comments)     Neck tightens up   Levofloxacin Other (See Comments)    BASELINE PROLONGED QTc.     Zofran [Ondansetron Hcl] Other (See Comments)    BASELINE PROLONGED QTc.   Heparin Itching and Other (See Comments)    Pt is able to tolerate IV heparin, but not sub-Q.   Morphine And Related Itching   Penicillins Itching    Has patient had a PCN reaction  causing immediate rash, facial/tongue/throat swelling, SOB or lightheadedness with hypotension: Yes Has patient had a PCN reaction causing severe rash involving mucus membranes or skin necrosis: No Has patient had a PCN reaction that required hospitalization: No Has patient had a PCN reaction occurring within the last 10 years: Yes If  all of the above answers are "NO", then may proceed with Cephalosporin use.      OBJECTIVE: Vitals:   11/18/20 0903 11/18/20 1254 11/18/20 2002 11/19/20 0538  BP: 130/76 119/71 109/71 118/76  Pulse:  87 92 83  Resp:  20 16 16   Temp:  97.6 F (36.4 C) 98.5 F (36.9 C) (!) 97.4 F (36.3 C)  TempSrc:  Oral Oral Oral  SpO2:  95% 99% 97%  Weight:      Height:       Body mass index is 23.98 kg/m.  Physical Exam Constitutional:      Appearance: Normal appearance.  HENT:     Head: Normocephalic and atraumatic.     Right Ear: Tympanic membrane normal.     Left Ear: Tympanic membrane normal.     Nose: Nose normal.     Mouth/Throat:     Mouth: Mucous membranes are moist.  Eyes:     Extraocular Movements: Extraocular movements intact.     Conjunctiva/sclera: Conjunctivae normal.     Pupils: Pupils are equal, round, and reactive to light.  Cardiovascular:     Rate and Rhythm: Normal rate and regular rhythm.     Heart sounds: No murmur heard.   No friction rub. No gallop.  Pulmonary:     Effort: Pulmonary effort is normal.     Breath sounds: Normal breath sounds.  Abdominal:     General: Abdomen is flat.     Palpations: Abdomen is soft.  Musculoskeletal:        General: Normal range of motion.  Skin:    General: Skin is warm and dry.  Neurological:     General: No focal deficit present.     Mental Status: She is alert and oriented to person, place, and time.  Psychiatric:        Mood and Affect: Mood normal.      Lab Results Lab Results  Component Value Date   WBC 7.5 11/19/2020   HGB 10.1 (L) 11/19/2020   HCT 31.3 (L) 11/19/2020   MCV 91.5 11/19/2020   PLT 554 (H) 11/19/2020    Lab Results  Component Value Date   CREATININE 0.92 11/19/2020   BUN 10 11/19/2020   NA 134 (L) 11/19/2020   K 3.9 11/19/2020   CL 101 11/19/2020   CO2 25 11/19/2020    Lab Results  Component Value Date   ALT 8 11/19/2020   AST 19 11/19/2020   ALKPHOS 96 11/19/2020    BILITOT 0.5 11/19/2020        Laurice Record, [facility] 11/19/2020, 8:48 AM

## 2020-11-19 NOTE — Progress Notes (Signed)
   11/19/20 1325  Mobility  Activity Ambulated in hall  Level of Assistance Contact guard assist, steadying assist  Assistive Device Front wheel walker  Distance Ambulated (ft) 80 ft  Mobility Ambulated with assistance in hallway  Mobility Response Tolerated well  Mobility performed by Mobility specialist  $Mobility charge 1 Mobility   Pt agreeable to mobilizing this morning. Ambulated in the hall about 55ft with RW, tolerated well. Took 1 rest break. She stated that she was experiencing some knee pain but unable to express which knee. Left pt in bed with call bell at side. Pt requested rollator for her next session. RN notified of session.    [place] [facility] Acute Rehab Services Office: [phone]

## 2020-11-19 NOTE — Progress Notes (Signed)
Pharmacy Antibiotic Note  [name] is a 65 y.o. female admitted on 11/11/2020 with pleural effusion.  Pharmacy has been consulted for Zosyn dosing to cover bacteremia from recent perforated appendicitis with bacteremia and now possible pulmonary infection.  11/19/20 9:11 AM  - SCr up to 0.92 - CCM adding vancomycin  Plan: Vancomycin 1250 mg IV Q 24 hrs. Goal AUC 400-550. Expected AUC: 504 SCr used: 0.92  Transition from Zosyn to cefepime + Flagyl:   Cefepime 2 g iv q 12 hours  Flagyl 500 mg iv q 12 hours  Will f/u renal function, culture results, and clinical course Levels if/when indicated   Height: 5\' 6"  (167.6 cm) Weight: 67.4 kg (148 lb 9.4 oz) IBW/kg (Calculated) : 59.3  Temp (24hrs), Avg:97.8 F (36.6 C), Min:97.4 F (36.3 C), Max:98.5 F (36.9 C)  Recent Labs  Lab 11/14/20 0425 11/16/20 0517 11/17/20 0424 11/18/20 0431 11/19/20 0455  WBC  --  8.4 7.5 7.3 7.5  CREATININE 0.77 0.62 0.68 0.66 0.92     Estimated Creatinine Clearance: 56.3 mL/min (by C-G formula based on SCr of 0.92 mg/dL).    Allergies  Allergen Reactions   Iohexol Anaphylaxis   Iodine-131 Other (See Comments)     Neck tightens up   Levofloxacin Other (See Comments)    BASELINE PROLONGED QTc.     Zofran [Ondansetron Hcl] Other (See Comments)    BASELINE PROLONGED QTc.   Heparin Itching and Other (See Comments)    Pt is able to tolerate IV heparin, but not sub-Q.   Morphine And Related Itching   Penicillins Itching    Has patient had a PCN reaction causing immediate rash, facial/tongue/throat swelling, SOB or lightheadedness with hypotension: Yes Has patient had a PCN reaction causing severe rash involving mucus membranes or skin necrosis: No Has patient had a PCN reaction that required hospitalization: No Has patient had a PCN reaction occurring within the last 10 years: Yes If all of the above answers are "NO", then may proceed with Cephalosporin use.      Antimicrobials  this admission: 10/23 cefepime x 1, 11/9 >> 10/23 CTX>>10/27 10/28 cefazolin>>10/31 11/1 cefadroxil>> 11/8 10/23 Flagyl >> 11/8, 11/9 >> 11/8 Zosyn >> 11/9 11/9 vancomycin >>   Dose adjustments this admission: None  Microbiology results: 10/23 BCx: E.coli, Bacteroides fragilis 11/1 Pleural fluid: NGF SCx:  11/8 MRSA PCR:+  Thank you for allowing pharmacy to be a part of this patient's care.  Ulice Dash D  11/19/2020 9:02 AM

## 2020-11-19 NOTE — Telephone Encounter (Signed)
Appt made. Patient is still in patient. Appt info will print on D/C paper work.   Nothing further needed at this time.

## 2020-11-20 DIAGNOSIS — R0902 Hypoxemia: Secondary | ICD-10-CM | POA: Diagnosis not present

## 2020-11-20 DIAGNOSIS — R0609 Other forms of dyspnea: Secondary | ICD-10-CM | POA: Diagnosis not present

## 2020-11-20 DIAGNOSIS — J90 Pleural effusion, not elsewhere classified: Secondary | ICD-10-CM | POA: Diagnosis not present

## 2020-11-20 DIAGNOSIS — Z9889 Other specified postprocedural states: Secondary | ICD-10-CM | POA: Diagnosis not present

## 2020-11-20 LAB — CBC
HCT: 31.3 % — ABNORMAL LOW (ref 36.0–46.0)
Hemoglobin: 9.8 g/dL — ABNORMAL LOW (ref 12.0–15.0)
MCH: 29 pg (ref 26.0–34.0)
MCHC: 31.3 g/dL (ref 30.0–36.0)
MCV: 92.6 fL (ref 80.0–100.0)
Platelets: 459 10*3/uL — ABNORMAL HIGH (ref 150–400)
RBC: 3.38 MIL/uL — ABNORMAL LOW (ref 3.87–5.11)
RDW: 14.3 % (ref 11.5–15.5)
WBC: 6.2 10*3/uL (ref 4.0–10.5)
nRBC: 0 % (ref 0.0–0.2)

## 2020-11-20 LAB — EXPECTORATED SPUTUM ASSESSMENT W GRAM STAIN, RFLX TO RESP C

## 2020-11-20 LAB — BASIC METABOLIC PANEL
Anion gap: 6 (ref 5–15)
BUN: 8 mg/dL (ref 8–23)
CO2: 23 mmol/L (ref 22–32)
Calcium: 8.2 mg/dL — ABNORMAL LOW (ref 8.9–10.3)
Chloride: 103 mmol/L (ref 98–111)
Creatinine, Ser: 0.72 mg/dL (ref 0.44–1.00)
GFR, Estimated: >60 mL/min (ref >60)
Glucose, Bld: 95 mg/dL (ref 70–99)
Potassium: 4.3 mmol/L (ref 3.5–5.1)
Sodium: 132 mmol/L — ABNORMAL LOW (ref 135–145)

## 2020-11-20 LAB — PROCALCITONIN: Procalcitonin: <0.1 ng/mL

## 2020-11-20 MED ORDER — DICLOFENAC SODIUM 1 % EX GEL
4.0000 g | Freq: Four times a day (QID) | CUTANEOUS | Status: DC
  Administered 2020-11-20 – 2020-11-24 (×14): 4 g via TOPICAL
  Filled 2020-11-20: qty 100

## 2020-11-20 MED ORDER — SODIUM CHLORIDE 0.9 % IV SOLN
2.0000 g | Freq: Three times a day (TID) | INTRAVENOUS | Status: DC
  Administered 2020-11-20 – 2020-11-23 (×10): 2 g via INTRAVENOUS
  Filled 2020-11-20 (×11): qty 2

## 2020-11-20 MED ORDER — METRONIDAZOLE 500 MG PO TABS
500.0000 mg | ORAL_TABLET | Freq: Two times a day (BID) | ORAL | Status: DC
  Administered 2020-11-20 – 2020-11-23 (×6): 500 mg via ORAL
  Filled 2020-11-20 (×6): qty 1

## 2020-11-20 MED ORDER — SODIUM CHLORIDE 3 % IN NEBU
4.0000 mL | INHALATION_SOLUTION | Freq: Every day | RESPIRATORY_TRACT | Status: AC
  Administered 2020-11-20 – 2020-11-22 (×3): 4 mL via RESPIRATORY_TRACT
  Filled 2020-11-20 (×3): qty 4

## 2020-11-20 MED ORDER — FLUTICASONE PROPIONATE 50 MCG/ACT NA SUSP
2.0000 | Freq: Every day | NASAL | Status: DC
Start: 2020-11-20 — End: 2020-11-24
  Administered 2020-11-20 – 2020-11-24 (×5): 2 via NASAL
  Filled 2020-11-20: qty 16

## 2020-11-20 NOTE — Progress Notes (Signed)
PHARMACY NOTE:  ANTIMICROBIAL RENAL DOSAGE ADJUSTMENT  Current antimicrobial regimen includes a mismatch between antimicrobial dosage and estimated renal function.  As per policy approved by the Pharmacy & Therapeutics and Medical Executive Committees, the antimicrobial dosage will be adjusted accordingly.  Current antimicrobial dosage:  Cefepime 2 g iv q 12 hours  Indication: HAP  Renal Function:  Estimated Creatinine Clearance: 64.8 mL/min (by C-G formula based on SCr of 0.72 mg/dL). []      On intermittent HD, scheduled: []      On CRRT    Antimicrobial dosage has been changed to:  Cefepime 2 g iv q 8 hours  Additional comments:  Thank you for allowing pharmacy to be a part of this patient's care.  Napoleon Form, [facility] 11/20/2020 7:42 AM

## 2020-11-20 NOTE — Progress Notes (Signed)
[facility] for [facility]ious Disease  Date of Admission:  11/11/2020   Total days of antibiotics 17  Active Problems:   Dyspnea on exertion   Pleural effusion          Assessment: 65 year old female with recent admission for perforated appendicitis status post lap appendectomy and drain placement with E. coli and B fragilis bacteremia discharged on cefadroxil and metronidazole(EOT 11/10).  She was readmitted due to shortness of breath and found to have right pleural effusion status post thoracentesis.  #Right lung consolidation with exudative effusion #Intraabdominal abscess - CT on admission showed large right pleural effusion(increased in size). Thoracentesis on 11/1: Cx negative. Ct obtained on 11/5 due to increased coughing showed right sided consolidation/atelectasis, 2.7x3.1 cm retrocecal phlegmonous density not significantly changed. IR did not appreciate drainable fluid, thoracentesis not performed.. ID was re-engaged. - Pulmonology consulted and cefadroxil and metronidazole discontnued, pip-tazo started due to concern hospital acquired PNA. 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  In speaking with Pulmonology, no plans for intervention.  Recommendations: - There is worsening consolidation although 02 requirement was stable. I'm ok with treating broadly for HAP.  -Continue cefepime 2gm q8h  -Transition metronidazole IV->PO -Continue vancomycin for now(MRSA nares+) -Please repeat sputum Cx  to tailor antibiotic therapy(pt has a productive cough). If no growth on Cx consider transitioning to levaquin to complete treatment for HAP.   Microbiology  Antibiotics: Cefadroxil 10/31-11/8 Metronidazole 10/23-present Cefazolin 10/28-10/31 CTX 10/23--10/27 Cefepime 10/23,11/9- Vancomyicin 11/9-present Pip-tazo 11/8-11/9  Cultures: 11/1 Pleural fluid Cx: no growth 10.23 1/2 blood Cx ecoli, B fragilis(beta lactamase positive) SUBJECTIVE: Pt is resting in bed. She  reports a productive cough. She also reports "seeing leaves."  Interval: on 3L on, Afebrile overnight. Wbc 6.2K. Review of Systems: Review of Systems  Constitutional: Negative.   HENT: Negative.    Eyes: Negative.   Respiratory:         SHOB improving  Cardiovascular: Negative.   Gastrointestinal:        Abdomen tender  Genitourinary: Negative.   Musculoskeletal: Negative.   Skin: Negative.   Neurological: Negative.   Endo/Heme/Allergies: Negative.   Psychiatric/Behavioral: Negative.      Scheduled Meds:  amitriptyline  50 mg Oral QHS   amLODipine  10 mg Oral q morning   atorvastatin  40 mg Oral QHS   Chlorhexidine Gluconate Cloth  6 each Topical Q0600   docusate sodium  100 mg Oral BID   enoxaparin (LOVENOX) injection  40 mg Subcutaneous Q24H   gabapentin  100 mg Oral BID   lidocaine  1 patch Transdermal Q24H   linaclotide  145 mcg Oral QAC breakfast   mupirocin ointment  1 application Nasal BID   pantoprazole  40 mg Oral q morning   polycarbophil  625 mg Oral Daily   polyethylene glycol  17 g Oral Daily   senna  1 tablet Oral Daily   umeclidinium-vilanterol  1 puff Inhalation Daily   Continuous Infusions:  ceFEPime (MAXIPIME) IV     metronidazole 500 mg (11/20/20 0754)   vancomycin 1,250 mg (11/19/20 1011)   PRN Meds:.acetaminophen **OR** acetaminophen, albuterol, diphenhydrAMINE, fentaNYL (SUBLIMAZE) injection, lip balm, Muscle Rub, oxyCODONE Allergies  Allergen Reactions   Iohexol Anaphylaxis   Iodine-131 Other (See Comments)     Neck tightens up   Levofloxacin Other (See Comments)    BASELINE PROLONGED QTc.     Zofran [Ondansetron Hcl] Other (See Comments)    BASELINE PROLONGED QTc.  Heparin Itching and Other (See Comments)    Pt is able to tolerate IV heparin, but not sub-Q.   Morphine And Related Itching    OBJECTIVE: Vitals:   11/19/20 1313 11/19/20 1957 11/20/20 0540 11/20/20 0742  BP: 115/71 111/67 123/61   Pulse: 86 92 87   Resp: 20 16 16     Temp: 97.8 F (36.6 C) 97.8 F (36.6 C) 98.8 F (37.1 C)   TempSrc: Oral Oral Oral   SpO2: 100% 92% 99% (!) 3%  Weight:      Height:       Body mass index is 23.98 kg/m.  Physical Exam Constitutional:      Appearance: Normal appearance.  HENT:     Head: Normocephalic and atraumatic.     Right Ear: Tympanic membrane normal.     Left Ear: Tympanic membrane normal.     Nose: Nose normal.     Mouth/Throat:     Mouth: Mucous membranes are moist.  Eyes:     Extraocular Movements: Extraocular movements intact.     Conjunctiva/sclera: Conjunctivae normal.     Pupils: Pupils are equal, round, and reactive to light.  Cardiovascular:     Rate and Rhythm: Normal rate and regular rhythm.     Heart sounds: No murmur heard.   No friction rub. No gallop.  Pulmonary:     Effort: Pulmonary effort is normal.     Comments: RLL crackles Abdominal:     General: Abdomen is flat.     Palpations: Abdomen is soft.  Musculoskeletal:        General: Normal range of motion.  Skin:    General: Skin is warm and dry.  Neurological:     General: No focal deficit present.     Mental Status: She is alert and oriented to person, place, and time.  Psychiatric:        Mood and Affect: Mood normal.      Lab Results Lab Results  Component Value Date   WBC 6.2 11/20/2020   HGB 9.8 (L) 11/20/2020   HCT 31.3 (L) 11/20/2020   MCV 92.6 11/20/2020   PLT 459 (H) 11/20/2020    Lab Results  Component Value Date   CREATININE 0.72 11/20/2020   BUN 8 11/20/2020   NA 132 (L) 11/20/2020   K 4.3 11/20/2020   CL 103 11/20/2020   CO2 23 11/20/2020    Lab Results  Component Value Date   ALT 8 11/19/2020   AST 19 11/19/2020   ALKPHOS 96 11/19/2020   BILITOT 0.5 11/19/2020        Laurice Record, [place] [facility]ious [facility] 11/20/2020, 9:28 AM

## 2020-11-20 NOTE — Progress Notes (Signed)
Physical Therapy Treatment Patient Details Name: [name] MRN: [id number] DOB: 04-17-1955 Today's Date: 11/20/2020   History of Present Illness 65 y.o. female presenting with abdominal pain and shortness of breath Work up revealed  large pleural effusion, now s/p thoracentesis yielding exudative effusion. Pt was recently d/c from [facility] on 10/31 for sepsis secondary to perforated appendicitis, ileus, E. coli bacteremia. S/p laproscopic appendectomy 10/24. requiring 4L [place] at time of d/c. PMH significant of HTN, HLD, GERD, CKD stage III, COPD, RA, ACDF (2017).    PT Comments    Pt on BSC on arrival with bed alarming.  Pt requiring cues for safety however min/guard - supervision assist during mobility.  Pt reports wearing O2 PRN at home and declined wearing O2 for ambulation.  SPO2 92% on room air with ambulation.    Recommendations for follow up therapy are one component of a multi-disciplinary discharge planning process, led by the attending physician.  Recommendations may be updated based on patient status, additional functional criteria and insurance authorization.  Follow Up Recommendations  No PT follow up     Assistance Recommended at Discharge Set up Supervision/Assistance  Equipment Recommendations  Other (comment) (pt requesting rollator)    Recommendations for Other Services       Precautions / Restrictions Precautions Precautions: Fall Precaution Comments: monitor sats     Mobility  Bed Mobility Overal bed mobility: Needs Assistance Bed Mobility: Sit to Supine       Sit to supine: Modified independent (Device/Increase time)   General bed mobility comments: pt sitting on BSC with bed alarming upon arrival    Transfers Overall transfer level: Needs assistance Equipment used: Rollator (4 wheels) Transfers: Sit to/from Stand Sit to Stand: Min guard           General transfer comment: min/guard for safety with lines; pt also needed cues for using brakes  on rollator; pt declined wearing O2 with mobility    Ambulation/Gait Ambulation/Gait assistance: Min guard Gait Distance (Feet): 80 Feet Assistive device: Rollator (4 wheels) Gait Pattern/deviations: Step-through pattern;Decreased stride length;Trunk flexed       General Gait Details: cues for safe use of rollator, SPO2 92% on room air, pt fatigues quickly   Stairs             Wheelchair Mobility    Modified Rankin (Stroke Patients Only)       Balance                                            Cognition Arousal/Alertness: Awake/alert Behavior During Therapy: WFL for tasks assessed/performed Overall Cognitive Status: Within Functional Limits for tasks assessed                                 General Comments: requires encouragement        Exercises      General Comments        Pertinent Vitals/Pain Pain Assessment: No/denies pain    Home Living                          Prior Function            PT Goals (current goals can now be found in the care plan section) Progress towards PT goals: Progressing toward goals  Frequency    Min 2X/week      PT Plan Current plan remains appropriate    Co-evaluation              AM-PAC PT "6 Clicks" Mobility   Outcome Measure  Help needed turning from your back to your side while in a flat bed without using bedrails?: None Help needed moving from lying on your back to sitting on the side of a flat bed without using bedrails?: None Help needed moving to and from a bed to a chair (including a wheelchair)?: A Little Help needed standing up from a chair using your arms (e.g., wheelchair or bedside chair)?: A Little Help needed to walk in hospital room?: A Little Help needed climbing 3-5 steps with a railing? : A Little 6 Click Score: 20    End of Session Equipment Utilized During Treatment: Gait belt Activity Tolerance: Patient tolerated treatment  well Patient left: in bed;with call bell/phone within reach;with bed alarm set Nurse Communication: Mobility status PT Visit Diagnosis: Other abnormalities of gait and mobility (R26.89)     Time: 8315-1761 PT Time Calculation (min) (ACUTE ONLY): 13 min  Charges:  $Gait Training: 8-22 mins                     Arlyce Dice, DPT Acute Rehabilitation Services Pager: [phone] Office: [place] 11/20/2020, 2:10 PM

## 2020-11-20 NOTE — Progress Notes (Signed)
PROGRESS NOTE    [name]  MWU:[id number] DOB: Jun 03, 1955 DOA: 11/11/2020 PCP: Nolene Ebbs, MD    Brief Narrative:  65 y.o. female with medical history significant of HTN, HLD, GERD, recent perforated appendicitis s/p lap appy and drain placement. Presenting with abdominal pain and shortness of breath. She was discharged yesterday from [facility] after presenting with a perforated appendix and e coli bacteremia. She had a lap appy performed. A drain was placed after washout. She was started on abx for e coli. She improved and was discharged home on an abx regimen to continue for an additional 8 days. At the time she was hypoxic on 4L [place]. She was discharged to home on O2 and IS. She reports last night she was short of breath and she felt pain all over. Work up demonstrated large pleural effusion, now s/p thoracentesis yielding exudative effusion  Assessment & Plan:   Active Problems:   Dyspnea on exertion   Pleural effusion  Acute hypoxemic respiratory failure secondary to Right pleural effusion     -Patient was recently discharged requiring 4 L nasal cannula at time of discharge, patient is O2 nave  -Patient now status post thoracentesis, fluid analysis reviewed, findings suggestive of exudative effusion,, cultures thus far negative     - continue IS     - d-dimer elevated, but VQ is negative     -ID had recently recommended to continue antibiotics below. ID signed off -On 11/5, pt noted to have increased coughing. Ordered and reviewed CXR with radiologist. Findings of increasing R sided pleural effusion -CT chest reviewed, findings of moderate R effusion with.  -Discussed with interventional radiology.  Patient underwent attempt at right thoracentesis.  Per radiology, notable consolidation is seen instead of drainable fluid and thoracentesis not performed. -Given rapidly developing consolidation despite current antibiotics, had consulted ID for recs -Started on broad-spectrum therapy  with cefepime, vancomycin, and Flagyl -Sputum culture sent yesterday, but sample was not adequate for culture -Repeat culture requested, will also order hypertonic saline nebs to help induce sputum   Abdominal pain with recent perforated appendicitis status post laparoscopic appendectomy with washout and drain placement -General surgery following -Patient completed appropriate course of antibiotics -Drain since pulled per general surgery, general surgery has since signed off -Pt continues with some R sided pain. Repeat CT abd w/o contrast reviewed. Findings of pleural effusion above. Otherwise no significant intra-abdominal changes from prior study -Continue with analgesia as needed   HTN     - continue on home regimen as pt tolerates   GERD     - cont with PPI as tolerated   HLD     - continue home regimen   Tobacco abuse     -Cessation done at time of presentation   Hyponatremia     -Sodium remains stable -Serum osmole's reviewed, low, urine sodium is high at 110.  Suggestive of SIADH -Sodium remains stable at 132 today.  Will ensure patient is on a 1200 cc fluid restricted diet. -Repeat bmet in AM   DVT prophylaxis: Lovenox subq Code Status: Full Family Communication: Pt in room  Status is: Inpatient  Remains inpatient secondary to severity of illness   Consultants:  ID General surgery Pulmonology  Procedures:    Antimicrobials: Anti-infectives (From admission, onward)    Start     Dose/Rate Route Frequency Ordered Stop   11/20/20 2200  metroNIDAZOLE (FLAGYL) tablet 500 mg        500 mg Oral Every 12 hours 11/20/20  1257     11/20/20 1400  ceFEPIme (MAXIPIME) 2 g in sodium chloride 0.9 % 100 mL IVPB        2 g 200 mL/hr over 30 Minutes Intravenous Every 8 hours 11/20/20 0741     11/19/20 1600  metroNIDAZOLE (FLAGYL) IVPB 500 mg  Status:  Discontinued        500 mg 100 mL/hr over 60 Minutes Intravenous Every 12 hours 11/19/20 0908 11/20/20 1257   11/19/20  1600  ceFEPIme (MAXIPIME) 2 g in sodium chloride 0.9 % 100 mL IVPB  Status:  Discontinued        2 g 200 mL/hr over 30 Minutes Intravenous Every 12 hours 11/19/20 0910 11/20/20 0741   11/19/20 1000  vancomycin (VANCOREADY) IVPB 1250 mg/250 mL        1,250 mg 166.7 mL/hr over 90 Minutes Intravenous Every 24 hours 11/19/20 0901     11/18/20 1800  piperacillin-tazobactam (ZOSYN) IVPB 3.375 g  Status:  Discontinued        3.375 g 12.5 mL/hr over 240 Minutes Intravenous Every 8 hours 11/18/20 1725 11/19/20 0908   11/12/20 1000  cefadroxil (DURICEF) capsule 1,000 mg  Status:  Discontinued        1,000 mg Oral 2 times daily 11/12/20 0823 11/12/20 0830   11/12/20 0915  metroNIDAZOLE (FLAGYL) tablet 500 mg  Status:  Discontinued        500 mg Oral Every 8 hours 11/12/20 0823 11/12/20 0832   11/11/20 2200  cefadroxil (DURICEF) capsule 1,000 mg  Status:  Discontinued        1,000 mg Oral 2 times daily 11/11/20 1626 11/18/20 1725   11/11/20 2200  metroNIDAZOLE (FLAGYL) tablet 500 mg  Status:  Discontinued        500 mg Oral Every 12 hours 11/11/20 1626 11/18/20 1725       Subjective: Still feels short of breath on exertion.  Cough is less productive today.  Objective: Vitals:   11/20/20 0540 11/20/20 0742 11/20/20 0937 11/20/20 1617  BP: 123/61  122/64 136/76  Pulse: 87   87  Resp: 16   18  Temp: 98.8 F (37.1 C)   98.8 F (37.1 C)  TempSrc: Oral   Oral  SpO2: 99% (!) 3%  97%  Weight:      Height:        Intake/Output Summary (Last 24 hours) at 11/20/2020 1809 Last data filed at 11/20/2020 0604 Gross per 24 hour  Intake 240 ml  Output 200 ml  Net 40 ml   Filed Weights   11/11/20 2100  Weight: 67.4 kg    Examination: General exam: Alert, awake, oriented x 3 Respiratory system: Diminished breath sounds at right base. Respiratory effort normal. Cardiovascular system:RRR. No murmurs, rubs, gallops. Gastrointestinal system: Abdomen is nondistended, soft and nontender. No  organomegaly or masses felt. Normal bowel sounds heard. Central nervous system: Alert and oriented. No focal neurological deficits. Extremities: No C/C/E, +pedal pulses Skin: No rashes, lesions or ulcers Psychiatry: Judgement and insight appear normal. Mood & affect appropriate.    Data Reviewed: I have personally reviewed following labs and imaging studies  CBC: Recent Labs  Lab 11/16/20 0517 11/17/20 0424 11/18/20 0431 11/19/20 0455 11/20/20 0440  WBC 8.4 7.5 7.3 7.5 6.2  HGB 9.9* 10.0* 10.2* 10.1* 9.8*  HCT 31.1* 30.8* 31.8* 31.3* 31.3*  MCV 91.2 90.6 91.4 91.5 92.6  PLT 582* 564* 531* 554* 263*   Basic Metabolic Panel: Recent Labs  Lab 11/14/20  0425 11/16/20 0517 11/17/20 0424 11/18/20 0431 11/19/20 0455 11/20/20 0440  NA 131* 130* 130* 131* 134* 132*  K 4.2 4.2 4.3 3.9 3.9 4.3  CL 100 97* 99 99 101 103  CO2 25 26 25 24 25 23   GLUCOSE 109* 98 100* 105* 121* 95  BUN 7* 8 7* 8 10 8   CREATININE 0.77 0.62 0.68 0.66 0.92 0.72  CALCIUM 7.9* 7.9* 8.2* 8.3* 8.2* 8.2*  MG 1.7  --   --   --   --   --    GFR: Estimated Creatinine Clearance: 64.8 mL/min (by C-G formula based on SCr of 0.72 mg/dL). Liver Function Tests: Recent Labs  Lab 11/14/20 0425 11/16/20 0517 11/17/20 0424 11/18/20 0431 11/19/20 0455  AST 18 14* 15 14* 19  ALT 8 8 7 7 8   ALKPHOS 72 68 74 70 96  BILITOT 0.7 0.4 0.5 0.4 0.5  PROT 6.5 6.7 6.9 7.1 7.6  ALBUMIN 2.2* 2.0* 2.2* 2.1* 2.3*   No results for input(s): LIPASE, AMYLASE in the last 168 hours.  No results for input(s): AMMONIA in the last 168 hours. Coagulation Profile: No results for input(s): INR, PROTIME in the last 168 hours. Cardiac Enzymes: No results for input(s): CKTOTAL, CKMB, CKMBINDEX, TROPONINI in the last 168 hours. BNP (last 3 results) No results for input(s): PROBNP in the last 8760 hours. HbA1C: No results for input(s): HGBA1C in the last 72 hours. CBG: No results for input(s): GLUCAP in the last 168 hours. Lipid  Profile: No results for input(s): CHOL, HDL, LDLCALC, TRIG, CHOLHDL, LDLDIRECT in the last 72 hours. Thyroid Function Tests: No results for input(s): TSH, T4TOTAL, FREET4, T3FREE, THYROIDAB in the last 72 hours. Anemia Panel: No results for input(s): VITAMINB12, FOLATE, FERRITIN, TIBC, IRON, RETICCTPCT in the last 72 hours. Sepsis Labs: Recent Labs  Lab 11/18/20 1911 11/19/20 0455 11/20/20 0440  PROCALCITON 0.16 0.15 <0.10    Recent Results (from the past 240 hour(s))  Resp Panel by RT-PCR (Flu A&B, Covid) Nasopharyngeal Swab     Status: None   Collection Time: 11/11/20 11:08 AM   Specimen: Nasopharyngeal Swab; Nasopharyngeal(NP) swabs in vial transport medium  Result Value Ref Range Status   SARS Coronavirus 2 by RT PCR NEGATIVE NEGATIVE Final    Comment: (NOTE) SARS-CoV-2 target nucleic acids are NOT DETECTED.  The SARS-CoV-2 RNA is generally detectable in upper respiratory specimens during the acute phase of infection. The lowest concentration of SARS-CoV-2 viral copies this assay can detect is 138 copies/mL. A negative result does not preclude SARS-Cov-2 infection and should not be used as the sole basis for treatment or other patient management decisions. A negative result may occur with  improper specimen collection/handling, submission of specimen other than nasopharyngeal swab, presence of viral mutation(s) within the areas targeted by this assay, and inadequate number of viral copies(<138 copies/mL). A negative result must be combined with clinical observations, patient history, and epidemiological information. The expected result is Negative.  Fact Sheet for Patients:  EntrepreneurPulse.com.au  Fact Sheet for Healthcare Providers:  IncredibleEmployment.be  This test is no t yet approved or cleared by the [place] FDA and  has been authorized for detection and/or diagnosis of SARS-CoV-2 by FDA under an Emergency Use  Authorization (EUA). This EUA will remain  in effect (meaning this test can be used) for the duration of the COVID-19 declaration under Section 564(b)(1) of the Act, 21 U.S.C.section 360bbb-3(b)(1), unless the authorization is terminated  or revoked sooner.  Influenza A by PCR NEGATIVE NEGATIVE Final   Influenza B by PCR NEGATIVE NEGATIVE Final    Comment: (NOTE) The Xpert Xpress SARS-CoV-2/FLU/RSV plus assay is intended as an aid in the diagnosis of influenza from Nasopharyngeal swab specimens and should not be used as a sole basis for treatment. Nasal washings and aspirates are unacceptable for Xpert Xpress SARS-CoV-2/FLU/RSV testing.  Fact Sheet for Patients: EntrepreneurPulse.com.au  Fact Sheet for Healthcare Providers: IncredibleEmployment.be  This test is not yet approved or cleared by the [place] FDA and has been authorized for detection and/or diagnosis of SARS-CoV-2 by FDA under an Emergency Use Authorization (EUA). This EUA will remain in effect (meaning this test can be used) for the duration of the COVID-19 declaration under Section 564(b)(1) of the Act, 21 U.S.C. section 360bbb-3(b)(1), unless the authorization is terminated or revoked.  Performed at [facility], [place] [address] [place], [place] [address]   Culture, body fluid w Gram Stain-bottle     Status: None   Collection Time: 11/11/20 12:27 PM   Specimen: Pleura  Result Value Ref Range Status   Specimen Description PLEURAL  Final   Special Requests NONE  Final   Culture   Final    NO GROWTH 5 DAYS Performed at [facility], [address], [place], [place] [address]    Report Status 11/17/2020 FINAL  Final  Gram stain     Status: None   Collection Time: 11/11/20 12:27 PM   Specimen: Pleura  Result Value Ref Range Status   Specimen Description PLEURAL  Final   Special Requests NONE  Final   Gram Stain   Final    CYTOSPIN  SMEAR WBC PRESENT,BOTH PMN AND MONONUCLEAR NO ORGANISMS SEEN Performed at [facility], [address] [place], [place] [address]    Report Status 11/14/2020 FINAL  Final  MRSA Next Gen by PCR, Nasal     Status: Abnormal   Collection Time: 11/18/20  6:36 PM   Specimen: Nasal Mucosa; Nasal Swab  Result Value Ref Range Status   MRSA by PCR Next Gen DETECTED (A) NOT DETECTED Final    Comment: RESULT CALLED TO, READ BACK BY AND VERIFIED WITH: Ishmael Holter RN.@[email or web address]  ON 11.8.22 BY TCALDWELL MT. (NOTE) The GeneXpert MRSA Assay (FDA approved for NASAL specimens only), is one component of a comprehensive MRSA colonization surveillance program. It is not intended to diagnose MRSA infection nor to guide or monitor treatment for MRSA infections. Test performance is not FDA approved in patients less than 80 years old. Performed at [facility], [place] [address] [place], [place] [address]   Expectorated Sputum Assessment w Gram Stain, Rflx to Resp Cult     Status: None   Collection Time: 11/20/20  6:04 AM   Specimen: Sputum  Result Value Ref Range Status   Specimen Description SPUTUM  Final   Special Requests NONE  Final   Sputum evaluation   Final    Sputum specimen not acceptable for testing.  Please recollect.   Performed at [facility], [place] [address], [place], [place] [address]    Report Status 11/20/2020 FINAL  Final      Radiology Studies: No results found.  Scheduled Meds:  amitriptyline  50 mg Oral QHS   amLODipine  10 mg Oral q morning   atorvastatin  40 mg Oral QHS   Chlorhexidine Gluconate Cloth  6 each Topical Q0600   diclofenac Sodium  4 g Topical  QID   docusate sodium  100 mg Oral BID   enoxaparin (LOVENOX) injection  40 mg Subcutaneous Q24H   fluticasone  2 spray Each Nare Daily   gabapentin  100 mg Oral BID   lidocaine  1 patch Transdermal Q24H   linaclotide  145 mcg Oral QAC breakfast   metroNIDAZOLE  500 mg Oral  Q12H   mupirocin ointment  1 application Nasal BID   pantoprazole  40 mg Oral q morning   polycarbophil  625 mg Oral Daily   polyethylene glycol  17 g Oral Daily   senna  1 tablet Oral Daily   sodium chloride HYPERTONIC  4 mL Nebulization Daily   umeclidinium-vilanterol  1 puff Inhalation Daily   Continuous Infusions:  ceFEPime (MAXIPIME) IV 2 g (11/20/20 1310)   vancomycin 1,250 mg (11/20/20 0934)     LOS: 8 days   Kathie Dike, MD [facility] Pager On [place]  If 7PM-7AM, please contact night-coverage 11/20/2020, 6:09 PM

## 2020-11-21 ENCOUNTER — Inpatient Hospital Stay (HOSPITAL_COMMUNITY): Payer: Medicare Other

## 2020-11-21 LAB — TROPONIN I (HIGH SENSITIVITY)
Troponin I (High Sensitivity): 6 ng/L (ref <18)
Troponin I (High Sensitivity): 6 ng/L (ref <18)

## 2020-11-21 LAB — EXPECTORATED SPUTUM ASSESSMENT W GRAM STAIN, RFLX TO RESP C

## 2020-11-21 LAB — D-DIMER, QUANTITATIVE: D-Dimer, Quant: 7.6 ug/mL-FEU — ABNORMAL HIGH (ref 0.00–0.50)

## 2020-11-21 MED ORDER — ENOXAPARIN SODIUM 30 MG/0.3ML IJ SOSY
30.0000 mg | PREFILLED_SYRINGE | Freq: Once | INTRAMUSCULAR | Status: AC
  Administered 2020-11-21: 30 mg via SUBCUTANEOUS
  Filled 2020-11-21: qty 0.3

## 2020-11-21 MED ORDER — NITROGLYCERIN 0.4 MG SL SUBL
0.4000 mg | SUBLINGUAL_TABLET | SUBLINGUAL | Status: DC | PRN
  Administered 2020-11-21: 0.4 mg via SUBLINGUAL
  Filled 2020-11-21: qty 1

## 2020-11-21 MED ORDER — ENOXAPARIN SODIUM 30 MG/0.3ML IJ SOSY: 30.0000 mg | PREFILLED_SYRINGE | Freq: Once | INTRAMUSCULAR | Status: DC

## 2020-11-21 MED ORDER — ENOXAPARIN SODIUM 80 MG/0.8ML IJ SOSY: 70.0000 mg | PREFILLED_SYRINGE | Freq: Two times a day (BID) | INTRAMUSCULAR | Status: DC

## 2020-11-21 MED ORDER — ENOXAPARIN SODIUM 80 MG/0.8ML IJ SOSY
65.0000 mg | PREFILLED_SYRINGE | Freq: Two times a day (BID) | INTRAMUSCULAR | Status: DC
  Administered 2020-11-22: 65 mg via SUBCUTANEOUS
  Filled 2020-11-21: qty 0.8

## 2020-11-21 NOTE — Progress Notes (Signed)
[place] for Infectious Disease  Date of Admission:  11/11/2020   Total days of antibiotics 18  Active Problems:   Dyspnea on exertion   Pleural effusion          Assessment: 65 year old female with recent admission for perforated appendicitis status post lap appendectomy and drain placement with E. coli and B fragilis bacteremia discharged on cefadroxil and metronidazole(EOT 11/10).  She was readmitted due to shortness of breath and found to have right pleural effusion status post thoracentesis.  #Right lung consolidation with exudative effusion #Intraabdominal abscess - CT on admission showed large right pleural effusion(increased in size) and 2.6x1.7 cm RLQ phlegmonous density. Thoracentesis on 11/1: Cx negative.  -Ct obtained on 11/5 due to increased coughing showed right sided consolidation/atelectasis, 2.7x2.2 cm retrocecal phlegmonous density not significantly changed. IR did not appreciate drainable fluid, thoracentesis not performed.. ID was re-engaged. - Pulmonology consulted and cefadroxil and metronidazole discontnued, pip-tazo started due to concern hospital acquired PNA. 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  In speaking with Pulmonology, no plans for intervention.   Recommendations: - There is worsening consolidation although 02 requirement was stable. I'm ok with treating broadly for HAP.  -Continue cefepime 2gm q8h, metronidazole and vancomycin for now(MRSA nares+) -Follow sputum Cx  to tailor antibiotic therapy -If no growth on Cx then transition to levaquin 750 mg PO qd(HAP and Ecoli)+ metronidazole 500mg  PO bid(B. fragilis) to complete 4 weeks of therapy from lap appy for intraabdominal abscess (10/24-11/20). Repeat CT at the end of treatment.   ID will sign off, please re-engage with any new concerns.  Microbiology  Antibiotics: Cefadroxil 10/31-11/8 Metronidazole 10/23-present Cefazolin 10/28-10/31 CTX 10/23--10/27 Cefepime  10/23,11/9- Vancomyicin 11/9-present Pip-tazo 11/8-11/9  Cultures: 11/1 Pleural fluid Cx: no growth 10.23 1/2 blood Cx ecoli, B fragilis(beta lactamase positive) SUBJECTIVE: Pt is resting in bed on RA. No new complaints. No significant overnight events.  Review of Systems: Review of Systems  Constitutional: Negative.   HENT: Negative.    Eyes: Negative.   Respiratory:         SHOB improving  Cardiovascular: Negative.   Gastrointestinal:        Abdomen tenderness improving  Genitourinary: Negative.   Musculoskeletal: Negative.   Skin: Negative.   Neurological: Negative.   Endo/Heme/Allergies: Negative.   Psychiatric/Behavioral: Negative.      Scheduled Meds:  amitriptyline  50 mg Oral QHS   amLODipine  10 mg Oral q morning   atorvastatin  40 mg Oral QHS   Chlorhexidine Gluconate Cloth  6 each Topical Q0600   diclofenac Sodium  4 g Topical QID   docusate sodium  100 mg Oral BID   enoxaparin (LOVENOX) injection  40 mg Subcutaneous Q24H   fluticasone  2 spray Each Nare Daily   gabapentin  100 mg Oral BID   lidocaine  1 patch Transdermal Q24H   linaclotide  145 mcg Oral QAC breakfast   metroNIDAZOLE  500 mg Oral Q12H   mupirocin ointment  1 application Nasal BID   pantoprazole  40 mg Oral q morning   polycarbophil  625 mg Oral Daily   polyethylene glycol  17 g Oral Daily   senna  1 tablet Oral Daily   sodium chloride HYPERTONIC  4 mL Nebulization Daily   umeclidinium-vilanterol  1 puff Inhalation Daily   Continuous Infusions:  ceFEPime (MAXIPIME) IV Stopped (11/21/20 1329)   vancomycin Stopped (11/21/20 1028)   PRN Meds:.acetaminophen **OR** acetaminophen, albuterol, diphenhydrAMINE, fentaNYL (  SUBLIMAZE) injection, lip balm, Muscle Rub, oxyCODONE Allergies  Allergen Reactions   Iohexol Anaphylaxis   Iodine-131 Other (See Comments)     Neck tightens up   Levofloxacin Other (See Comments)    BASELINE PROLONGED QTc.     Zofran [Ondansetron Hcl] Other (See  Comments)    BASELINE PROLONGED QTc.   Heparin Itching and Other (See Comments)    Pt is able to tolerate IV heparin, but not sub-Q.   Morphine And Related Itching    OBJECTIVE: Vitals:   11/21/20 1144 11/21/20 1801 11/21/20 1821 11/21/20 1854  BP: 116/72 120/75 (!) 69/49 113/70  Pulse: 90 96  90  Resp: 19     Temp: 99.6 F (37.6 C)     TempSrc: Oral     SpO2: 96% 93%  98%  Weight:      Height:       Body mass index is 23.98 kg/m.  Physical Exam Constitutional:      Appearance: Normal appearance.  HENT:     Head: Normocephalic and atraumatic.     Right Ear: Tympanic membrane normal.     Left Ear: Tympanic membrane normal.     Nose: Nose normal.     Mouth/Throat:     Mouth: Mucous membranes are moist.  Eyes:     Extraocular Movements: Extraocular movements intact.     Conjunctiva/sclera: Conjunctivae normal.     Pupils: Pupils are equal, round, and reactive to light.  Cardiovascular:     Rate and Rhythm: Normal rate and regular rhythm.     Heart sounds: No murmur heard.   No friction rub. No gallop.  Pulmonary:     Effort: Pulmonary effort is normal.     Breath sounds: Normal breath sounds.  Abdominal:     General: Abdomen is flat.     Palpations: Abdomen is soft.  Musculoskeletal:        General: Normal range of motion.  Skin:    General: Skin is warm and dry.  Neurological:     General: No focal deficit present.     Mental Status: She is alert and oriented to person, place, and time.  Psychiatric:        Mood and Affect: Mood normal.      Lab Results Lab Results  Component Value Date   WBC 6.2 11/20/2020   HGB 9.8 (L) 11/20/2020   HCT 31.3 (L) 11/20/2020   MCV 92.6 11/20/2020   PLT 459 (H) 11/20/2020    Lab Results  Component Value Date   CREATININE 0.72 11/20/2020   BUN 8 11/20/2020   NA 132 (L) 11/20/2020   K 4.3 11/20/2020   CL 103 11/20/2020   CO2 23 11/20/2020    Lab Results  Component Value Date   ALT 8 11/19/2020   AST 19  11/19/2020   ALKPHOS 96 11/19/2020   BILITOT 0.5 11/19/2020        Laurice Record, [place] for Infectious Disease [facility] 11/21/2020, 8:04 PM

## 2020-11-21 NOTE — Progress Notes (Signed)
PROGRESS NOTE    [name]  TKZ:[id number] DOB: [birth year] DOA: 11/11/2020 PCP: Nolene Ebbs, MD    Brief Narrative:  65 y.o. female with medical history significant of HTN, HLD, GERD, recent perforated appendicitis s/p lap appy and drain placement. Presenting with abdominal pain and shortness of breath. She was discharged yesterday from [facility] after presenting with a perforated appendix and e coli bacteremia. She had a lap appy performed. A drain was placed after washout. She was started on abx for e coli. She improved and was discharged home on an abx regimen to continue for an additional 8 days. At the time she was hypoxic on 4L [place]. She was discharged to home on O2 and IS. She reports last night she was short of breath and she felt pain all over. Work up demonstrated large pleural effusion, now s/p thoracentesis yielding exudative effusion  Assessment & Plan:   Active Problems:   Dyspnea on exertion   Pleural effusion  Acute hypoxemic respiratory failure secondary to Right pleural effusion     -Patient was recently discharged requiring 4 L nasal cannula at time of discharge, patient is O2 nave  -Patient now status post thoracentesis, fluid analysis reviewed, findings suggestive of exudative effusion,, cultures thus far negative     - continue IS     - d-dimer elevated, but VQ is negative     -ID had recently recommended to continue antibiotics below. ID signed off -On 11/5, pt noted to have increased coughing. Ordered and reviewed CXR with radiologist. Findings of increasing R sided pleural effusion -CT chest reviewed, findings of moderate R effusion with.  -Discussed with interventional radiology.  Patient underwent attempt at right thoracentesis.  Per radiology, notable consolidation is seen instead of drainable fluid and thoracentesis not performed. -Given rapidly developing consolidation despite current antibiotics, had consulted ID for recs -Started on broad-spectrum therapy  with cefepime, vancomycin, and Flagyl -Sputum culture sent yesterday, but sample was not adequate for culture -Repeat culture ordered and is in process   Abdominal pain with recent perforated appendicitis status post laparoscopic appendectomy with washout and drain placement -General surgery following -Patient completed appropriate course of antibiotics -Drain since pulled per general surgery, general surgery has since signed off -Pt continues with some R sided pain. Repeat CT abd w/o contrast reviewed. Findings of pleural effusion above. Otherwise no significant intra-abdominal changes from prior study -Continue with analgesia as needed   HTN     - continue on home regimen as pt tolerates   GERD     - cont with PPI as tolerated   HLD     - continue home regimen   Tobacco abuse     -Cessation done at time of presentation   Hyponatremia     -Sodium remains stable -Serum osmole's reviewed, low, urine sodium is high at 110.  Suggestive of SIADH -Sodium remains stable at 132 today.  Will ensure patient is on a 1200 cc fluid restricted diet. -Repeat bmet in AM  Chest pain -Unclear etiology -EKG without acute changes -Chest x-ray did not show any significant change in right pleural effusion -She does have some reproducible findings on exam -Hemodynamics appear to be stable -Due to her significant symptoms,, D-dimer was checked and noted to be elevated -We will change Lovenox to full dose Lovenox for now -Consider VQ scan in a.m. since she has a allergy to IV contrast   DVT prophylaxis: Lovenox subq Code Status: Full Family Communication: Pt in room  Status is:  Inpatient  Remains inpatient secondary to severity of illness   Consultants:  ID General surgery Pulmonology  Procedures:    Antimicrobials: Anti-infectives (From admission, onward)    Start     Dose/Rate Route Frequency Ordered Stop   11/20/20 2200  metroNIDAZOLE (FLAGYL) tablet 500 mg        500 mg Oral  Every 12 hours 11/20/20 1257     11/20/20 1400  ceFEPIme (MAXIPIME) 2 g in sodium chloride 0.9 % 100 mL IVPB        2 g 200 mL/hr over 30 Minutes Intravenous Every 8 hours 11/20/20 0741     11/19/20 1600  metroNIDAZOLE (FLAGYL) IVPB 500 mg  Status:  Discontinued        500 mg 100 mL/hr over 60 Minutes Intravenous Every 12 hours 11/19/20 0908 11/20/20 1257   11/19/20 1600  ceFEPIme (MAXIPIME) 2 g in sodium chloride 0.9 % 100 mL IVPB  Status:  Discontinued        2 g 200 mL/hr over 30 Minutes Intravenous Every 12 hours 11/19/20 0910 11/20/20 0741   11/19/20 1000  vancomycin (VANCOREADY) IVPB 1250 mg/250 mL        1,250 mg 166.7 mL/hr over 90 Minutes Intravenous Every 24 hours 11/19/20 0901     11/18/20 1800  piperacillin-tazobactam (ZOSYN) IVPB 3.375 g  Status:  Discontinued        3.375 g 12.5 mL/hr over 240 Minutes Intravenous Every 8 hours 11/18/20 1725 11/19/20 0908   11/12/20 1000  cefadroxil (DURICEF) capsule 1,000 mg  Status:  Discontinued        1,000 mg Oral 2 times daily 11/12/20 0823 11/12/20 0830   11/12/20 0915  metroNIDAZOLE (FLAGYL) tablet 500 mg  Status:  Discontinued        500 mg Oral Every 8 hours 11/12/20 0823 11/12/20 0832   11/11/20 2200  cefadroxil (DURICEF) capsule 1,000 mg  Status:  Discontinued        1,000 mg Oral 2 times daily 11/11/20 1626 11/18/20 1725   11/11/20 2200  metroNIDAZOLE (FLAGYL) tablet 500 mg  Status:  Discontinued        500 mg Oral Every 12 hours 11/11/20 1626 11/18/20 1725       Subjective: Describes onset of chest discomfort that occurred while at rest.  Felt as though something was not right and everything was going dark.  Describes it as a tightness across her chest, mostly in the left chest and in her left shoulder.  Reported some improvement with nitroglycerin, although became very hypotensive after nitro  Objective: Vitals:   11/21/20 1144 11/21/20 1801 11/21/20 1821 11/21/20 1854  BP: 116/72 120/75 (!) 69/49 113/70  Pulse: 90 96   90  Resp: 19     Temp: 99.6 F (37.6 C)     TempSrc: Oral     SpO2: 96% 93%  98%  Weight:      Height:        Intake/Output Summary (Last 24 hours) at 11/21/2020 2028 Last data filed at 11/21/2020 1734 Gross per 24 hour  Intake 1060.61 ml  Output --  Net 1060.61 ml   Filed Weights   11/11/20 2100  Weight: 67.4 kg    Examination: General exam: Alert, awake, oriented x 3 Respiratory system: Diminished breath sounds at right base.  Respiratory effort normal.  Some tenderness to palpation under left breast Cardiovascular system:RRR. No murmurs, rubs, gallops. Gastrointestinal system: Abdomen is nondistended, soft and nontender. No organomegaly or masses felt.  Normal bowel sounds heard. Central nervous system: Alert and oriented. No focal neurological deficits. Extremities: No C/C/E, +pedal pulses Skin: No rashes, lesions or ulcers Psychiatry: Judgement and insight appear normal. Mood & affect appropriate.     Data Reviewed: I have personally reviewed following labs and imaging studies  CBC: Recent Labs  Lab 11/16/20 0517 11/17/20 0424 11/18/20 0431 11/19/20 0455 11/20/20 0440  WBC 8.4 7.5 7.3 7.5 6.2  HGB 9.9* 10.0* 10.2* 10.1* 9.8*  HCT 31.1* 30.8* 31.8* 31.3* 31.3*  MCV 91.2 90.6 91.4 91.5 92.6  PLT 582* 564* 531* 554* 295*   Basic Metabolic Panel: Recent Labs  Lab 11/16/20 0517 11/17/20 0424 11/18/20 0431 11/19/20 0455 11/20/20 0440  NA 130* 130* 131* 134* 132*  K 4.2 4.3 3.9 3.9 4.3  CL 97* 99 99 101 103  CO2 26 25 24 25 23   GLUCOSE 98 100* 105* 121* 95  BUN 8 7* 8 10 8   CREATININE 0.62 0.68 0.66 0.92 0.72  CALCIUM 7.9* 8.2* 8.3* 8.2* 8.2*   GFR: Estimated Creatinine Clearance: 64.8 mL/min (by C-G formula based on SCr of 0.72 mg/dL). Liver Function Tests: Recent Labs  Lab 11/16/20 0517 11/17/20 0424 11/18/20 0431 11/19/20 0455  AST 14* 15 14* 19  ALT 8 7 7 8   ALKPHOS 68 74 70 96  BILITOT 0.4 0.5 0.4 0.5  PROT 6.7 6.9 7.1 7.6  ALBUMIN  2.0* 2.2* 2.1* 2.3*   No results for input(s): LIPASE, AMYLASE in the last 168 hours.  No results for input(s): AMMONIA in the last 168 hours. Coagulation Profile: No results for input(s): INR, PROTIME in the last 168 hours. Cardiac Enzymes: No results for input(s): CKTOTAL, CKMB, CKMBINDEX, TROPONINI in the last 168 hours. BNP (last 3 results) No results for input(s): PROBNP in the last 8760 hours. HbA1C: No results for input(s): HGBA1C in the last 72 hours. CBG: No results for input(s): GLUCAP in the last 168 hours. Lipid Profile: No results for input(s): CHOL, HDL, LDLCALC, TRIG, CHOLHDL, LDLDIRECT in the last 72 hours. Thyroid Function Tests: No results for input(s): TSH, T4TOTAL, FREET4, T3FREE, THYROIDAB in the last 72 hours. Anemia Panel: No results for input(s): VITAMINB12, FOLATE, FERRITIN, TIBC, IRON, RETICCTPCT in the last 72 hours. Sepsis Labs: Recent Labs  Lab 11/18/20 1911 11/19/20 0455 11/20/20 0440  PROCALCITON 0.16 0.15 <0.10    Recent Results (from the past 240 hour(s))  MRSA Next Gen by PCR, Nasal     Status: Abnormal   Collection Time: 11/18/20  6:36 PM   Specimen: Nasal Mucosa; Nasal Swab  Result Value Ref Range Status   MRSA by PCR Next Gen DETECTED (A) NOT DETECTED Final    Comment: RESULT CALLED TO, READ BACK BY AND VERIFIED WITH: Ishmael Holter RN.@2115  ON 11.8.22 BY TCALDWELL MT. (NOTE) The GeneXpert MRSA Assay (FDA approved for NASAL specimens only), is one component of a comprehensive MRSA colonization surveillance program. It is not intended to diagnose MRSA infection nor to guide or monitor treatment for MRSA infections. Test performance is not FDA approved in patients less than 80 years old. Performed at [facility], [place] [address] [place], [place] [address]   Expectorated Sputum Assessment w Gram Stain, Rflx to Resp Cult     Status: None   Collection Time: 11/20/20  6:04 AM   Specimen: Sputum  Result Value Ref  Range Status   Specimen Description SPUTUM  Final   Special Requests NONE  Final   Sputum evaluation   Final  Sputum specimen not acceptable for testing.  Please recollect.   Performed at [facility], [place] [address] [place], [place] [address]    Report Status 11/20/2020 FINAL  Final  Expectorated Sputum Assessment w Gram Stain, Rflx to Resp Cult     Status: None   Collection Time: 11/21/20  8:21 PM  Result Value Ref Range Status   Specimen Description EXPECTORATED SPUTUM  Final   Special Requests NONE  Final   Sputum evaluation   Final    Sputum specimen not acceptable for testing.  Please recollect.   NOTIFIED L.Georgiann Cocker, RN AT 2026 ON 11.11.22 BY N.THOMPSON Performed at [facility], [place] [address] [place], [place] [address]    Report Status 11/21/2020 FINAL  Final      Radiology Studies: DG CHEST PORT 1 VIEW  Result Date: 11/21/2020 CLINICAL DATA:  Chest pain, COPD, tobacco abuse EXAM: PORTABLE CHEST 1 VIEW COMPARISON:  11/15/2020 FINDINGS: Moderate right pleural effusion is unchanged. Rounded opacity within the right hilum corresponds to loculated fluid within the fissure better seen on a prior CT examination of 11/15/2020. No pneumothorax. No pleural effusion on the left. Moderate hiatal hernia again noted. Cardiac size within normal limits. Pulmonary vascularity is normal. No acute bone abnormality is seen. IMPRESSION: Stable moderate right pleural effusion with associated right basilar atelectasis. Moderate hiatal hernia. Electronically Signed   By: Fidela Salisbury M.D.   On: 11/21/2020 19:14    Scheduled Meds:  amitriptyline  50 mg Oral QHS   amLODipine  10 mg Oral q morning   atorvastatin  40 mg Oral QHS   Chlorhexidine Gluconate Cloth  6 each Topical Q0600   diclofenac Sodium  4 g Topical QID   docusate sodium  100 mg Oral BID   enoxaparin (LOVENOX) injection  40 mg Subcutaneous Q24H   fluticasone  2 spray Each Nare Daily    gabapentin  100 mg Oral BID   lidocaine  1 patch Transdermal Q24H   linaclotide  145 mcg Oral QAC breakfast   metroNIDAZOLE  500 mg Oral Q12H   mupirocin ointment  1 application Nasal BID   pantoprazole  40 mg Oral q morning   polycarbophil  625 mg Oral Daily   polyethylene glycol  17 g Oral Daily   senna  1 tablet Oral Daily   sodium chloride HYPERTONIC  4 mL Nebulization Daily   umeclidinium-vilanterol  1 puff Inhalation Daily   Continuous Infusions:  ceFEPime (MAXIPIME) IV Stopped (11/21/20 1329)   vancomycin Stopped (11/21/20 1028)     LOS: 9 days   Kathie Dike, MD [facility] Pager On [place]  If 7PM-7AM, please contact night-coverage 11/21/2020, 8:28 PM

## 2020-11-21 NOTE — Progress Notes (Signed)
Per MD administered nitro x1. Patient's BP dropped to 69/49. Notified MD and rapid. Within 5 minutes patient's BP increased to 111/54. Rapid at bedside. MD is aware. Will continue to monitor.
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
## 2020-11-21 NOTE — Progress Notes (Signed)
   11/21/20 1400  Mobility  Activity Refused mobility   Pt refused mobility secondary to pain in her abdomen. Hold mobility.   [place] Specialist Acute Rehab Services Office: [phone]

## 2020-11-21 NOTE — Progress Notes (Signed)
Patient reporting chest tightness and not feeling "right". Obtained vitals and EKG. Notified MD.

## 2020-11-21 NOTE — Significant Event (Signed)
Rapid Response Event Note   Reason for Call :  Hypotension and chest pain  Initial Focused Assessment:  Called by bedside RN to assess for chest pain and hypotension, BP 60s. Per bedside RN one dose of 0.4 mg Nitro tablet administered and BP dropped. On arrival patient alert in bed and answering questions appropriately, BP increased to 111/54 on arrival and 500 cc bolus held. Patient chest pain 5/10 but states it is better than prior to nitro administration. Oxycodone given at 1644 by bedside RN. Lungs clear bilaterally to ascultation, pulses 2+, NSR on monitor. Placed on 2 L [place] due to SpO2 being 88% on arrival. Increased to 97%.  Memon MD notified and to bedside.     Interventions:  Troponin, chest x ray and d-dimer ordered per Memon MD. EKG NSR. Recheck BP to ensure stabilzed  Plan of Care:  Nitro D/C by Memon MD, if BP drops again can give 500 cc fluid bolus per Memon MD. Monitor for new onset chest pain and notify rapid response for any concerns.    Event Summary:   MD Notified: [place] Call Time: [place] End Time: 1843  Josph Macho, RN

## 2020-11-22 ENCOUNTER — Inpatient Hospital Stay (HOSPITAL_COMMUNITY): Payer: Medicare Other

## 2020-11-22 LAB — COMPREHENSIVE METABOLIC PANEL
ALT: 8 U/L (ref 0–44)
AST: 17 U/L (ref 15–41)
Albumin: 2.2 g/dL — ABNORMAL LOW (ref 3.5–5.0)
Alkaline Phosphatase: 75 U/L (ref 38–126)
Anion gap: 6 (ref 5–15)
BUN: 11 mg/dL (ref 8–23)
CO2: 25 mmol/L (ref 22–32)
Calcium: 8.7 mg/dL — ABNORMAL LOW (ref 8.9–10.3)
Chloride: 100 mmol/L (ref 98–111)
Creatinine, Ser: 0.72 mg/dL (ref 0.44–1.00)
GFR, Estimated: >60 mL/min (ref >60)
Glucose, Bld: 93 mg/dL (ref 70–99)
Potassium: 4 mmol/L (ref 3.5–5.1)
Sodium: 131 mmol/L — ABNORMAL LOW (ref 135–145)
Total Bilirubin: 0.4 mg/dL (ref 0.3–1.2)
Total Protein: 7.5 g/dL (ref 6.5–8.1)

## 2020-11-22 LAB — CBC
HCT: 34 % — ABNORMAL LOW (ref 36.0–46.0)
Hemoglobin: 10.7 g/dL — ABNORMAL LOW (ref 12.0–15.0)
MCH: 28.9 pg (ref 26.0–34.0)
MCHC: 31.5 g/dL (ref 30.0–36.0)
MCV: 91.9 fL (ref 80.0–100.0)
Platelets: 578 10*3/uL — ABNORMAL HIGH (ref 150–400)
RBC: 3.7 MIL/uL — ABNORMAL LOW (ref 3.87–5.11)
RDW: 14.2 % (ref 11.5–15.5)
WBC: 5.5 10*3/uL (ref 4.0–10.5)
nRBC: 0 % (ref 0.0–0.2)

## 2020-11-22 LAB — EXPECTORATED SPUTUM ASSESSMENT W GRAM STAIN, RFLX TO RESP C

## 2020-11-22 MED ORDER — HYDROXYZINE HCL 25 MG PO TABS: 25.0000 mg | ORAL_TABLET | Freq: Three times a day (TID) | ORAL | Status: DC | PRN

## 2020-11-22 MED ORDER — HYDROXYZINE HCL 25 MG PO TABS
25.0000 mg | ORAL_TABLET | Freq: Three times a day (TID) | ORAL | Status: DC
  Administered 2020-11-22 – 2020-11-24 (×6): 25 mg via ORAL
  Filled 2020-11-22 (×6): qty 1

## 2020-11-22 MED ORDER — TECHNETIUM TO 99M ALBUMIN AGGREGATED
4.4000 | Freq: Once | INTRAVENOUS | Status: AC
  Administered 2020-11-22: 4.4 via INTRAVENOUS

## 2020-11-22 MED ORDER — ENOXAPARIN SODIUM 40 MG/0.4ML IJ SOSY
40.0000 mg | PREFILLED_SYRINGE | Freq: Every day | INTRAMUSCULAR | Status: DC
  Administered 2020-11-23 – 2020-11-24 (×2): 40 mg via SUBCUTANEOUS
  Filled 2020-11-22 (×2): qty 0.4

## 2020-11-22 MED ORDER — ZINC OXIDE 40 % EX OINT
TOPICAL_OINTMENT | Freq: Two times a day (BID) | CUTANEOUS | Status: DC
  Administered 2020-11-23: 1 via TOPICAL
  Filled 2020-11-22 (×2): qty 57

## 2020-11-22 MED ORDER — IPRATROPIUM-ALBUTEROL 0.5-2.5 (3) MG/3ML IN SOLN
3.0000 mL | Freq: Four times a day (QID) | RESPIRATORY_TRACT | Status: DC
  Administered 2020-11-22 (×2): 3 mL via RESPIRATORY_TRACT
  Filled 2020-11-22 (×2): qty 3

## 2020-11-22 MED ORDER — IPRATROPIUM-ALBUTEROL 0.5-2.5 (3) MG/3ML IN SOLN
3.0000 mL | Freq: Two times a day (BID) | RESPIRATORY_TRACT | Status: DC
  Administered 2020-11-23 – 2020-11-24 (×3): 3 mL via RESPIRATORY_TRACT
  Filled 2020-11-22 (×3): qty 3

## 2020-11-22 NOTE — Progress Notes (Signed)
PHARMACY NOTE -  Lovenox  Pharmacy has been assisting with dosing of Lovenox for r/o PE. Dosage remains stable at 1 mg/kg SQ bid  and need for further dosage adjustment appears unlikely at present given stable CrCl  Pharmacy will sign off, following peripherally for dose adjustments. Please reconsult if a change in clinical status warrants re-evaluation of dosage.  Reuel Boom, PharmD, BCPS [phone] 11/22/2020, 11:47 AM

## 2020-11-22 NOTE — Progress Notes (Signed)
PROGRESS NOTE    [name]  QBH:[id number] DOB: [birth year] DOA: 11/11/2020 PCP: Nolene Ebbs, MD    Brief Narrative:  65 y.o. female with medical history significant of HTN, HLD, GERD, recent perforated appendicitis s/p lap appy and drain placement. Presenting with abdominal pain and shortness of breath. She was discharged yesterday from [facility] after presenting with a perforated appendix and e coli bacteremia. She had a lap appy performed. A drain was placed after washout. She was started on abx for e coli. She improved and was discharged home on an abx regimen to continue for an additional 8 days. At the time she was hypoxic on 4L [place]. She was discharged to home on O2 and IS. She reports last night she was short of breath and she felt pain all over. Work up demonstrated large pleural effusion, now s/p thoracentesis yielding exudative effusion  Assessment & Plan:   Active Problems:   Dyspnea on exertion   Pleural effusion  Acute hypoxemic respiratory failure secondary to Right pleural effusion     -Patient was recently discharged requiring 4 L nasal cannula at time of discharge, patient is O2 nave  -Patient now status post thoracentesis, fluid analysis reviewed, findings suggestive of exudative effusion,, cultures thus far negative     - continue IS     - d-dimer elevated, but VQ is negative     -ID had recently recommended to continue antibiotics below. ID signed off -On 11/5, pt noted to have increased coughing. Ordered and reviewed CXR with radiologist. Findings of increasing R sided pleural effusion -CT chest reviewed, findings of moderate R effusion with.  -Discussed with interventional radiology.  Patient underwent attempt at right thoracentesis.  Per radiology, notable consolidation is seen instead of drainable fluid and thoracentesis not performed. -Given rapidly developing consolidation despite current antibiotics, had consulted ID for recs -Started on broad-spectrum therapy  with cefepime, vancomycin, and Flagyl -Sputum culture sent 11/10, but sample was not adequate for culture -Repeat culture sent on 11/11 which was also felt to be inadequate -Recommendations from ID noted regarding Levaquin and Flagyl.  Patient has had issues with prolonged QT.  Will discuss further with ID regarding any further antibiotic options.  Suspect that we will just need to treat empirically.   Abdominal pain with recent perforated appendicitis status post laparoscopic appendectomy with washout and drain placement -General surgery following -Patient completed appropriate course of antibiotics -Drain since pulled per general surgery, general surgery has since signed off -Pt continues with some R sided pain. Repeat CT abd w/o contrast reviewed. Findings of pleural effusion above. Otherwise no significant intra-abdominal changes from prior study -Continue with analgesia as needed   HTN     - continue on home regimen as pt tolerates   GERD     - cont with PPI as tolerated   HLD     - continue home regimen   Tobacco abuse     -Cessation done at time of presentation   Hyponatremia     -Sodium remains stable -Serum osmole's reviewed, low, urine sodium is high at 110.  Suggestive of SIADH -Sodium remains stable at 132 today.  Will ensure patient is on a 1200 cc fluid restricted diet. -Repeat bmet in AM  Chest pain -Unclear etiology -EKG without acute changes -Chest x-ray did not show any significant change in right pleural effusion -She does have some reproducible findings on exam -Hemodynamics appear to be stable -Due to her significant symptoms,, D-dimer was checked and noted to  be elevated -VQ scan repeated and found to be negative -Suspect that her discomfort is musculoskeletal in origin   DVT prophylaxis: Lovenox subq Code Status: Full Family Communication: Pt in room  Status is: Inpatient  Remains inpatient secondary to severity of illness   Consultants:   ID General surgery Pulmonology  Procedures:    Antimicrobials: Anti-infectives (From admission, onward)    Start     Dose/Rate Route Frequency Ordered Stop   11/20/20 2200  metroNIDAZOLE (FLAGYL) tablet 500 mg        500 mg Oral Every 12 hours 11/20/20 1257     11/20/20 1400  ceFEPIme (MAXIPIME) 2 g in sodium chloride 0.9 % 100 mL IVPB        2 g 200 mL/hr over 30 Minutes Intravenous Every 8 hours 11/20/20 0741     11/19/20 1600  metroNIDAZOLE (FLAGYL) IVPB 500 mg  Status:  Discontinued        500 mg 100 mL/hr over 60 Minutes Intravenous Every 12 hours 11/19/20 0908 11/20/20 1257   11/19/20 1600  ceFEPIme (MAXIPIME) 2 g in sodium chloride 0.9 % 100 mL IVPB  Status:  Discontinued        2 g 200 mL/hr over 30 Minutes Intravenous Every 12 hours 11/19/20 0910 11/20/20 0741   11/19/20 1000  vancomycin (VANCOREADY) IVPB 1250 mg/250 mL        1,250 mg 166.7 mL/hr over 90 Minutes Intravenous Every 24 hours 11/19/20 0901     11/18/20 1800  piperacillin-tazobactam (ZOSYN) IVPB 3.375 g  Status:  Discontinued        3.375 g 12.5 mL/hr over 240 Minutes Intravenous Every 8 hours 11/18/20 1725 11/19/20 0908   11/12/20 1000  cefadroxil (DURICEF) capsule 1,000 mg  Status:  Discontinued        1,000 mg Oral 2 times daily 11/12/20 0823 11/12/20 0830   11/12/20 0915  metroNIDAZOLE (FLAGYL) tablet 500 mg  Status:  Discontinued        500 mg Oral Every 8 hours 11/12/20 0823 11/12/20 0832   11/11/20 2200  cefadroxil (DURICEF) capsule 1,000 mg  Status:  Discontinued        1,000 mg Oral 2 times daily 11/11/20 1626 11/18/20 1725   11/11/20 2200  metroNIDAZOLE (FLAGYL) tablet 500 mg  Status:  Discontinued        500 mg Oral Every 12 hours 11/11/20 1626 11/18/20 1725       Subjective: Patient reports that she intermittently has discomfort in her left chest, but also feels that the symptoms may have been present intermittently for quite some time.  Does not have any discomfort at this time.   Discomfort is reproducible when her left chest is palpated.  Objective: Vitals:   11/22/20 0557 11/22/20 0929 11/22/20 1015 11/22/20 1100  BP: 117/77 118/75  105/89  Pulse: 81   88  Resp:    18  Temp:    97.8 F (36.6 C)  TempSrc:    Axillary  SpO2: 97%  94% 98%  Weight:      Height:        Intake/Output Summary (Last 24 hours) at 11/22/2020 1933 Last data filed at 11/22/2020 1125 Gross per 24 hour  Intake 617.81 ml  Output 300 ml  Net 317.81 ml   Filed Weights   11/11/20 2100 11/21/20 2100  Weight: 67.4 kg 64.4 kg    Examination: General exam: Alert, awake, oriented x 3 Respiratory system: Diminished breath sounds at right base.  Respiratory effort normal.  Some tenderness to palpation under left breast Cardiovascular system:RRR. No murmurs, rubs, gallops. Gastrointestinal system: Abdomen is nondistended, soft and nontender. No organomegaly or masses felt. Normal bowel sounds heard. Central nervous system: Alert and oriented. No focal neurological deficits. Extremities: No C/C/E, +pedal pulses Skin: No rashes, lesions or ulcers Psychiatry: Judgement and insight appear normal. Mood & affect appropriate.     Data Reviewed: I have personally reviewed following labs and imaging studies  CBC: Recent Labs  Lab 11/17/20 0424 11/18/20 0431 11/19/20 0455 11/20/20 0440 11/22/20 0626  WBC 7.5 7.3 7.5 6.2 5.5  HGB 10.0* 10.2* 10.1* 9.8* 10.7*  HCT 30.8* 31.8* 31.3* 31.3* 34.0*  MCV 90.6 91.4 91.5 92.6 91.9  PLT 564* 531* 554* 459* 960*   Basic Metabolic Panel: Recent Labs  Lab 11/17/20 0424 11/18/20 0431 11/19/20 0455 11/20/20 0440 11/22/20 0626  NA 130* 131* 134* 132* 131*  K 4.3 3.9 3.9 4.3 4.0  CL 99 99 101 103 100  CO2 25 24 25 23 25   GLUCOSE 100* 105* 121* 95 93  BUN 7* 8 10 8 11   CREATININE 0.68 0.66 0.92 0.72 0.72  CALCIUM 8.2* 8.3* 8.2* 8.2* 8.7*   GFR: Estimated Creatinine Clearance: 64.8 mL/min (by C-G formula based on SCr of 0.72  mg/dL). Liver Function Tests: Recent Labs  Lab 11/16/20 0517 11/17/20 0424 11/18/20 0431 11/19/20 0455 11/22/20 0626  AST 14* 15 14* 19 17  ALT 8 7 7 8 8   ALKPHOS 68 74 70 96 75  BILITOT 0.4 0.5 0.4 0.5 0.4  PROT 6.7 6.9 7.1 7.6 7.5  ALBUMIN 2.0* 2.2* 2.1* 2.3* 2.2*   No results for input(s): LIPASE, AMYLASE in the last 168 hours.  No results for input(s): AMMONIA in the last 168 hours. Coagulation Profile: No results for input(s): INR, PROTIME in the last 168 hours. Cardiac Enzymes: No results for input(s): CKTOTAL, CKMB, CKMBINDEX, TROPONINI in the last 168 hours. BNP (last 3 results) No results for input(s): PROBNP in the last 8760 hours. HbA1C: No results for input(s): HGBA1C in the last 72 hours. CBG: No results for input(s): GLUCAP in the last 168 hours. Lipid Profile: No results for input(s): CHOL, HDL, LDLCALC, TRIG, CHOLHDL, LDLDIRECT in the last 72 hours. Thyroid Function Tests: No results for input(s): TSH, T4TOTAL, FREET4, T3FREE, THYROIDAB in the last 72 hours. Anemia Panel: No results for input(s): VITAMINB12, FOLATE, FERRITIN, TIBC, IRON, RETICCTPCT in the last 72 hours. Sepsis Labs: Recent Labs  Lab 11/18/20 1911 11/19/20 0455 11/20/20 0440  PROCALCITON 0.16 0.15 <0.10    Recent Results (from the past 240 hour(s))  MRSA Next Gen by PCR, Nasal     Status: Abnormal   Collection Time: 11/18/20  6:36 PM   Specimen: Nasal Mucosa; Nasal Swab  Result Value Ref Range Status   MRSA by PCR Next Gen DETECTED (A) NOT DETECTED Final    Comment: RESULT CALLED TO, READ BACK BY AND VERIFIED WITH: Ishmael Holter RN.@2115  ON 11.8.22 BY TCALDWELL MT. (NOTE) The GeneXpert MRSA Assay (FDA approved for NASAL specimens only), is one component of a comprehensive MRSA colonization surveillance program. It is not intended to diagnose MRSA infection nor to guide or monitor treatment for MRSA infections. Test performance is not FDA approved in patients less than 25  years old. Performed at [facility], [place] [address] [place], [place] [address]   Expectorated Sputum Assessment w Gram Stain, Rflx to Resp Cult     Status: None  Collection Time: 11/20/20  6:04 AM   Specimen: Sputum  Result Value Ref Range Status   Specimen Description SPUTUM  Final   Special Requests NONE  Final   Sputum evaluation   Final    Sputum specimen not acceptable for testing.  Please recollect.   Performed at [facility], [place] [address] [place], [place] [address]    Report Status 11/20/2020 FINAL  Final  Expectorated Sputum Assessment w Gram Stain, Rflx to Resp Cult     Status: None   Collection Time: 11/21/20  8:21 PM  Result Value Ref Range Status   Specimen Description EXPECTORATED SPUTUM  Final   Special Requests NONE  Final   Sputum evaluation   Final    Sputum specimen not acceptable for testing.  Please recollect.   NOTIFIED L.Georgiann Cocker, RN AT 2026 ON 11.11.22 BY N.THOMPSON Performed at [facility], [place] [address], [place], [place] [address]    Report Status 11/21/2020 FINAL  Final  Expectorated Sputum Assessment w Gram Stain, Rflx to Resp Cult     Status: None   Collection Time: 11/22/20  7:37 AM   Specimen: Sputum  Result Value Ref Range Status   Specimen Description SPUTUM  Final   Special Requests NONE  Final   Sputum evaluation   Final    Sputum specimen not acceptable for testing.  Please recollect.   Notified Stefan Church RN 11/22/2020 AT 1210 Performed at [facility], [place] [address], [place], [place] [address]    Report Status 11/22/2020 FINAL  Final      Radiology Studies: NM Pulmonary Perfusion  Result Date: 11/22/2020 CLINICAL DATA:  PE suspected, low/intermediate prob, positive D-dimer EXAM: NUCLEAR MEDICINE PERFUSION LUNG SCAN TECHNIQUE: Perfusion images were obtained in multiple projections after intravenous injection of radiopharmaceutical. Ventilation scans  intentionally deferred if perfusion scan and chest x-ray adequate for interpretation during COVID 19 epidemic. RADIOPHARMACEUTICALS:  4.4 mCi Tc-19m MAA IV COMPARISON:  Chest radiograph yesterday chest CT 11/15/2020. Perfusion scan 11 days ago 11/11/2020 FINDINGS: No peripheral or wedge-shaped perfusion defects. Decreased perfusion to the right lung base corresponds to large pleural effusion and airspace disease. Overall no significant change from prior exam. IMPRESSION: No scintigraphic evidence of pulmonary embolus. No change from prior exam. Electronically Signed   By: Keith Rake M.D.   On: 11/22/2020 16:20   DG CHEST PORT 1 VIEW  Result Date: 11/21/2020 CLINICAL DATA:  Chest pain, COPD, tobacco abuse EXAM: PORTABLE CHEST 1 VIEW COMPARISON:  11/15/2020 FINDINGS: Moderate right pleural effusion is unchanged. Rounded opacity within the right hilum corresponds to loculated fluid within the fissure better seen on a prior CT examination of 11/15/2020. No pneumothorax. No pleural effusion on the left. Moderate hiatal hernia again noted. Cardiac size within normal limits. Pulmonary vascularity is normal. No acute bone abnormality is seen. IMPRESSION: Stable moderate right pleural effusion with associated right basilar atelectasis. Moderate hiatal hernia. Electronically Signed   By: Fidela Salisbury M.D.   On: 11/21/2020 19:14    Scheduled Meds:  amitriptyline  50 mg Oral QHS   amLODipine  10 mg Oral q morning   atorvastatin  40 mg Oral QHS   Chlorhexidine Gluconate Cloth  6 each Topical Q0600   diclofenac Sodium  4 g Topical QID   docusate sodium  100 mg Oral BID   [START ON 11/23/2020] enoxaparin (LOVENOX) injection  40 mg Subcutaneous Daily   fluticasone  2 spray Each Nare Daily   gabapentin  100 mg Oral BID   hydrOXYzine  25 mg Oral TID   ipratropium-albuterol  3 mL Nebulization Q6H   lidocaine  1 patch Transdermal Q24H   linaclotide  145 mcg Oral QAC breakfast   liver oil-zinc oxide    Topical BID   metroNIDAZOLE  500 mg Oral Q12H   mupirocin ointment  1 application Nasal BID   pantoprazole  40 mg Oral q morning   polycarbophil  625 mg Oral Daily   polyethylene glycol  17 g Oral Daily   senna  1 tablet Oral Daily   umeclidinium-vilanterol  1 puff Inhalation Daily   Continuous Infusions:  ceFEPime (MAXIPIME) IV 2 g (11/22/20 1612)   vancomycin 1,250 mg (11/22/20 0943)     LOS: 10 days   Kathie Dike, MD [facility] Pager On Amion  If 7PM-7AM, please contact night-coverage 11/22/2020, 7:33 PM

## 2020-11-22 NOTE — Progress Notes (Signed)
Pharmacy Antibiotic Note  [name] is a 65 y.o. female admitted on 11/11/2020 with pleural effusion.  Pharmacy has been consulted for Zosyn dosing to cover bacteremia from recent perforated appendicitis with bacteremia and now possible pulmonary infection.  Today, 11/22/2020: WBC normalized Afebrile > 7d SCr stable WNL No Cx growth (BCx only, Resp Cx rejected x 2)  Plan: Continue Vancomycin 1250 mg IV q24 hr (eAUC 504 w/ SCr 0.92, Vd 0.72) Hold off on checking vancomycin levels as expecting short course (< 7d) Continue Cefepime 2g IV q8 hr Flagyl per MD; dosing appropriate F/u ID recommendations for narrowing   Height: 5\' 6"  (167.6 cm) Weight: 64.4 kg (141 lb 14.4 oz) IBW/kg (Calculated) : 59.3  Temp (24hrs), Avg:98.9 F (37.2 C), Min:97.8 F (36.6 C), Max:99.9 F (37.7 C)  Recent Labs  Lab 11/17/20 0424 11/18/20 0431 11/19/20 0455 11/20/20 0440 11/22/20 0626  WBC 7.5 7.3 7.5 6.2 5.5  CREATININE 0.68 0.66 0.92 0.72 0.72     Estimated Creatinine Clearance: 64.8 mL/min (by C-G formula based on SCr of 0.72 mg/dL).    Allergies  Allergen Reactions   Iohexol Anaphylaxis   Iodine-131 Other (See Comments)     Neck tightens up   Levofloxacin Other (See Comments)    BASELINE PROLONGED QTc.     Zofran [Ondansetron Hcl] Other (See Comments)    BASELINE PROLONGED QTc.   Heparin Itching and Other (See Comments)    Pt is able to tolerate IV heparin, but not sub-Q.   Morphine And Related Itching    Antimicrobials this admission: 10/23 cefepime x 1, 11/9 >> 10/23 CTX>>10/27 10/28 cefazolin>>10/31 11/1 cefadroxil>> 11/8 10/23 Flagyl >> 11/8, 11/9 >> 11/8 Zosyn >> 11/9 11/9 vancomycin >>   Dose adjustments this admission: None  Microbiology results: 10/23 BCx: E.coli, Bacteroides fragilis 11/1 Pleural fluid: NGF 11/8 MRSA PCR:+ 11/10, 11/11, 11/12 Sput Cx all rejected d/t inadequate sample   Thank you for allowing pharmacy to be a part of this patient's  care.  Kanishk Stroebel A  11/22/2020 11:54 AM

## 2020-11-23 MED ORDER — DOXYCYCLINE HYCLATE 100 MG PO TABS
100.0000 mg | ORAL_TABLET | Freq: Two times a day (BID) | ORAL | Status: DC
  Administered 2020-11-23 – 2020-11-24 (×2): 100 mg via ORAL
  Filled 2020-11-23 (×2): qty 1

## 2020-11-23 MED ORDER — CEFDINIR 300 MG PO CAPS
300.0000 mg | ORAL_CAPSULE | Freq: Two times a day (BID) | ORAL | Status: DC
  Administered 2020-11-23 – 2020-11-24 (×2): 300 mg via ORAL
  Filled 2020-11-23 (×3): qty 1

## 2020-11-23 NOTE — Progress Notes (Signed)
PROGRESS NOTE    [name]  NWG:[id number] DOB: 04-10-1955 DOA: 11/11/2020 PCP: Nolene Ebbs, MD    Brief Narrative:  65 y.o. female with medical history significant of HTN, HLD, GERD, recent perforated appendicitis s/p lap appy and drain placement. Presenting with abdominal pain and shortness of breath. She was discharged yesterday from [facility] after presenting with a perforated appendix and e coli bacteremia. She had a lap appy performed. A drain was placed after washout. She was started on abx for e coli. She improved and was discharged home on an abx regimen to continue for an additional 8 days. At the time she was hypoxic on 4L [place]. She was discharged to home on O2 and IS. She reports last night she was short of breath and she felt pain all over. Work up demonstrated large pleural effusion, now s/p thoracentesis yielding exudative effusion  Assessment & Plan:   Active Problems:   Dyspnea on exertion   Pleural effusion  Acute hypoxemic respiratory failure secondary to Right pleural effusion     -Patient was recently discharged requiring 4 L nasal cannula at time of discharge, patient is O2 nave  -Patient now status post thoracentesis, fluid analysis reviewed, findings suggestive of exudative effusion,, cultures thus far negative     - continue IS     - d-dimer elevated, but VQ is negative     -ID had recently recommended to continue antibiotics below. ID signed off -On 11/5, pt noted to have increased coughing. Ordered and reviewed CXR with radiologist. Findings of increasing R sided pleural effusion -CT chest reviewed, findings of moderate R effusion with.  -Discussed with interventional radiology.  Patient underwent attempt at right thoracentesis.  Per radiology, notable consolidation is seen instead of drainable fluid and thoracentesis not performed. -Given rapidly developing consolidation despite current antibiotics, had consulted ID for recs -Started on broad-spectrum therapy  with cefepime, vancomycin, and Flagyl -Sputum culture sent 11/10, but sample was not adequate for culture -Repeat culture sent on 11/11 which was also felt to be inadequate -Case reviewed with infectious disease, Dr. Gale Journey.  With patient's clinical improvement, improved respiratory status, it was felt reasonable to transition the patient to oral antibiotics.  Since culture data from sputum was not available, recommendations were to transition to doxycycline as well as cefdinir to complete her course   Abdominal pain with recent perforated appendicitis status post laparoscopic appendectomy with washout and drain placement -General surgery following -Patient completed appropriate course of antibiotics -Drain since pulled per general surgery, general surgery has since signed off -Pt continues with some R sided pain. Repeat CT abd w/o contrast reviewed. Findings of pleural effusion above. Otherwise no significant intra-abdominal changes from prior study -Continue with analgesia as needed   HTN     - continue on home regimen as pt tolerates   GERD     - cont with PPI as tolerated   HLD     - continue home regimen   Tobacco abuse     -Cessation done at time of presentation   Hyponatremia     -Sodium remains stable -Serum osmole's reviewed, low, urine sodium is high at 110.  Suggestive of SIADH -Sodium remains stable at 132 today.  Will ensure patient is on a 1200 cc fluid restricted diet. -Repeat bmet in AM  Chest pain -EKG without acute changes -Chest x-ray did not show any significant change in right pleural effusion -She does have some reproducible findings on exam -Hemodynamics appear to be stable -Due  to her significant symptoms,, D-dimer was checked and noted to be elevated -VQ scan repeated and found to be negative -Suspect that her discomfort is musculoskeletal in origin   DVT prophylaxis: Lovenox subq Code Status: Full Family Communication: Pt in room  Status is:  Inpatient  Remains inpatient secondary to severity of illness   Consultants:  ID General surgery Pulmonology  Procedures:    Antimicrobials: Anti-infectives (From admission, onward)    Start     Dose/Rate Route Frequency Ordered Stop   11/23/20 2200  doxycycline (VIBRA-TABS) tablet 100 mg        100 mg Oral Every 12 hours 11/23/20 1633     11/23/20 2200  cefdinir (OMNICEF) capsule 300 mg        300 mg Oral Every 12 hours 11/23/20 1633     11/20/20 2200  metroNIDAZOLE (FLAGYL) tablet 500 mg  Status:  Discontinued        500 mg Oral Every 12 hours 11/20/20 1257 11/23/20 1633   11/20/20 1400  ceFEPIme (MAXIPIME) 2 g in sodium chloride 0.9 % 100 mL IVPB  Status:  Discontinued        2 g 200 mL/hr over 30 Minutes Intravenous Every 8 hours 11/20/20 0741 11/23/20 1633   11/19/20 1600  metroNIDAZOLE (FLAGYL) IVPB 500 mg  Status:  Discontinued        500 mg 100 mL/hr over 60 Minutes Intravenous Every 12 hours 11/19/20 0908 11/20/20 1257   11/19/20 1600  ceFEPIme (MAXIPIME) 2 g in sodium chloride 0.9 % 100 mL IVPB  Status:  Discontinued        2 g 200 mL/hr over 30 Minutes Intravenous Every 12 hours 11/19/20 0910 11/20/20 0741   11/19/20 1000  vancomycin (VANCOREADY) IVPB 1250 mg/250 mL  Status:  Discontinued        1,250 mg 166.7 mL/hr over 90 Minutes Intravenous Every 24 hours 11/19/20 0901 11/23/20 1633   11/18/20 1800  piperacillin-tazobactam (ZOSYN) IVPB 3.375 g  Status:  Discontinued        3.375 g 12.5 mL/hr over 240 Minutes Intravenous Every 8 hours 11/18/20 1725 11/19/20 0908   11/12/20 1000  cefadroxil (DURICEF) capsule 1,000 mg  Status:  Discontinued        1,000 mg Oral 2 times daily 11/12/20 0823 11/12/20 0830   11/12/20 0915  metroNIDAZOLE (FLAGYL) tablet 500 mg  Status:  Discontinued        500 mg Oral Every 8 hours 11/12/20 0823 11/12/20 0832   11/11/20 2200  cefadroxil (DURICEF) capsule 1,000 mg  Status:  Discontinued        1,000 mg Oral 2 times daily 11/11/20 1626  11/18/20 1725   11/11/20 2200  metroNIDAZOLE (FLAGYL) tablet 500 mg  Status:  Discontinued        500 mg Oral Every 12 hours 11/11/20 1626 11/18/20 1725       Subjective: Reports having some neck pain earlier today that radiated into her chest.  This appears to be a chronic issue that she has been following with neurosurgery for.  Overall breathing has been doing okay.  Cough is less productive.  Objective: Vitals:   11/23/20 0819 11/23/20 0822 11/23/20 0924 11/23/20 1318  BP:   102/63 (!) 106/54  Pulse:    92  Resp:    18  Temp:    98.4 F (36.9 C)  TempSrc:    Oral  SpO2: 96% 96%  95%  Weight:      Height:  Intake/Output Summary (Last 24 hours) at 11/23/2020 2043 Last data filed at 11/23/2020 1900 Gross per 24 hour  Intake 1080 ml  Output 400 ml  Net 680 ml   Filed Weights   11/11/20 2100 11/21/20 2100  Weight: 67.4 kg 64.4 kg    Examination: General exam: Alert, awake, oriented x 3 Respiratory system: Diminished breath sounds at right base.  Respiratory effort normal.  Some tenderness to palpation under left breast Cardiovascular system:RRR. No murmurs, rubs, gallops. Gastrointestinal system: Abdomen is nondistended, soft and nontender. No organomegaly or masses felt. Normal bowel sounds heard. Central nervous system: Alert and oriented. No focal neurological deficits. Extremities: No C/C/E, +pedal pulses Skin: No rashes, lesions or ulcers Psychiatry: Judgement and insight appear normal. Mood & affect appropriate.     Data Reviewed: I have personally reviewed following labs and imaging studies  CBC: Recent Labs  Lab 11/17/20 0424 11/18/20 0431 11/19/20 0455 11/20/20 0440 11/22/20 0626  WBC 7.5 7.3 7.5 6.2 5.5  HGB 10.0* 10.2* 10.1* 9.8* 10.7*  HCT 30.8* 31.8* 31.3* 31.3* 34.0*  MCV 90.6 91.4 91.5 92.6 91.9  PLT 564* 531* 554* 459* 426*   Basic Metabolic Panel: Recent Labs  Lab 11/17/20 0424 11/18/20 0431 11/19/20 0455 11/20/20 0440  11/22/20 0626  NA 130* 131* 134* 132* 131*  K 4.3 3.9 3.9 4.3 4.0  CL 99 99 101 103 100  CO2 25 24 25 23 25   GLUCOSE 100* 105* 121* 95 93  BUN 7* 8 10 8 11   CREATININE 0.68 0.66 0.92 0.72 0.72  CALCIUM 8.2* 8.3* 8.2* 8.2* 8.7*   GFR: Estimated Creatinine Clearance: 64.8 mL/min (by C-G formula based on SCr of 0.72 mg/dL). Liver Function Tests: Recent Labs  Lab 11/17/20 0424 11/18/20 0431 11/19/20 0455 11/22/20 0626  AST 15 14* 19 17  ALT 7 7 8 8   ALKPHOS 74 70 96 75  BILITOT 0.5 0.4 0.5 0.4  PROT 6.9 7.1 7.6 7.5  ALBUMIN 2.2* 2.1* 2.3* 2.2*   No results for input(s): LIPASE, AMYLASE in the last 168 hours.  No results for input(s): AMMONIA in the last 168 hours. Coagulation Profile: No results for input(s): INR, PROTIME in the last 168 hours. Cardiac Enzymes: No results for input(s): CKTOTAL, CKMB, CKMBINDEX, TROPONINI in the last 168 hours. BNP (last 3 results) No results for input(s): PROBNP in the last 8760 hours. HbA1C: No results for input(s): HGBA1C in the last 72 hours. CBG: No results for input(s): GLUCAP in the last 168 hours. Lipid Profile: No results for input(s): CHOL, HDL, LDLCALC, TRIG, CHOLHDL, LDLDIRECT in the last 72 hours. Thyroid Function Tests: No results for input(s): TSH, T4TOTAL, FREET4, T3FREE, THYROIDAB in the last 72 hours. Anemia Panel: No results for input(s): VITAMINB12, FOLATE, FERRITIN, TIBC, IRON, RETICCTPCT in the last 72 hours. Sepsis Labs: Recent Labs  Lab 11/18/20 1911 11/19/20 0455 11/20/20 0440  PROCALCITON 0.16 0.15 <0.10    Recent Results (from the past 240 hour(s))  MRSA Next Gen by PCR, Nasal     Status: Abnormal   Collection Time: 11/18/20  6:36 PM   Specimen: Nasal Mucosa; Nasal Swab  Result Value Ref Range Status   MRSA by PCR Next Gen DETECTED (A) NOT DETECTED Final    Comment: RESULT CALLED TO, READ BACK BY AND VERIFIED WITH: Ishmael Holter RN.@2115  ON 11.8.22 BY TCALDWELL MT. (NOTE) The GeneXpert MRSA Assay  (FDA approved for NASAL specimens only), is one component of a comprehensive MRSA colonization surveillance program. It is not intended  to diagnose MRSA infection nor to guide or monitor treatment for MRSA infections. Test performance is not FDA approved in patients less than 90 years old. Performed at [facility], [place] [address], [place], [place] [address]   Expectorated Sputum Assessment w Gram Stain, Rflx to Resp Cult     Status: None   Collection Time: 11/20/20  6:04 AM   Specimen: Sputum  Result Value Ref Range Status   Specimen Description SPUTUM  Final   Special Requests NONE  Final   Sputum evaluation   Final    Sputum specimen not acceptable for testing.  Please recollect.   Performed at [facility], [place] [address] [place], [place] [address]    Report Status 11/20/2020 FINAL  Final  Expectorated Sputum Assessment w Gram Stain, Rflx to Resp Cult     Status: None   Collection Time: 11/21/20  8:21 PM  Result Value Ref Range Status   Specimen Description EXPECTORATED SPUTUM  Final   Special Requests NONE  Final   Sputum evaluation   Final    Sputum specimen not acceptable for testing.  Please recollect.   NOTIFIED L.Georgiann Cocker, RN AT 2026 ON 11.11.22 BY N.THOMPSON Performed at [facility], [place] [address] [place], [place] [address]    Report Status 11/21/2020 FINAL  Final  Expectorated Sputum Assessment w Gram Stain, Rflx to Resp Cult     Status: None   Collection Time: 11/22/20  7:37 AM   Specimen: Sputum  Result Value Ref Range Status   Specimen Description SPUTUM  Final   Special Requests NONE  Final   Sputum evaluation   Final    Sputum specimen not acceptable for testing.  Please recollect.   Notified Stefan Church RN 11/22/2020 AT 1210 Performed at [facility], [place] [address] [place], [place] [address]    Report Status 11/22/2020 FINAL  Final      Radiology Studies: NM Pulmonary  Perfusion  Result Date: 11/22/2020 CLINICAL DATA:  PE suspected, low/intermediate prob, positive D-dimer EXAM: NUCLEAR MEDICINE PERFUSION LUNG SCAN TECHNIQUE: Perfusion images were obtained in multiple projections after intravenous injection of radiopharmaceutical. Ventilation scans intentionally deferred if perfusion scan and chest x-ray adequate for interpretation during COVID 19 epidemic. RADIOPHARMACEUTICALS:  4.4 mCi Tc-25m MAA IV COMPARISON:  Chest radiograph yesterday chest CT 11/15/2020. Perfusion scan 11 days ago 11/11/2020 FINDINGS: No peripheral or wedge-shaped perfusion defects. Decreased perfusion to the right lung base corresponds to large pleural effusion and airspace disease. Overall no significant change from prior exam. IMPRESSION: No scintigraphic evidence of pulmonary embolus. No change from prior exam. Electronically Signed   By: Keith Rake M.D.   On: 11/22/2020 16:20    Scheduled Meds:  amitriptyline  50 mg Oral QHS   amLODipine  10 mg Oral q morning   atorvastatin  40 mg Oral QHS   cefdinir  300 mg Oral Q12H   diclofenac Sodium  4 g Topical QID   docusate sodium  100 mg Oral BID   doxycycline  100 mg Oral Q12H   enoxaparin (LOVENOX) injection  40 mg Subcutaneous Daily   fluticasone  2 spray Each Nare Daily   gabapentin  100 mg Oral BID   hydrOXYzine  25 mg Oral TID   ipratropium-albuterol  3 mL Nebulization BID   lidocaine  1 patch Transdermal Q24H   linaclotide  145 mcg Oral QAC breakfast   liver oil-zinc oxide   Topical BID   mupirocin  ointment  1 application Nasal BID   pantoprazole  40 mg Oral q morning   polycarbophil  625 mg Oral Daily   polyethylene glycol  17 g Oral Daily   senna  1 tablet Oral Daily   umeclidinium-vilanterol  1 puff Inhalation Daily   Continuous Infusions:     LOS: 11 days   Kathie Dike, MD [facility] Pager On Amion  If 7PM-7AM, please contact night-coverage 11/23/2020, 8:43 PM

## 2020-11-24 DIAGNOSIS — R079 Chest pain, unspecified: Secondary | ICD-10-CM

## 2020-11-24 MED ORDER — ALBUTEROL SULFATE HFA 108 (90 BASE) MCG/ACT IN AERS: 1.0000 | INHALATION_SPRAY | Freq: Four times a day (QID) | RESPIRATORY_TRACT | 0 refills | Status: DC | PRN

## 2020-11-24 MED ORDER — UMECLIDINIUM-VILANTEROL 62.5-25 MCG/ACT IN AEPB: 1.0000 | INHALATION_SPRAY | Freq: Every day | RESPIRATORY_TRACT | 1 refills | Status: DC

## 2020-11-24 MED ORDER — OXYCODONE HCL 15 MG PO TABS: 15.0000 mg | ORAL_TABLET | Freq: Four times a day (QID) | ORAL | 0 refills | Status: DC | PRN

## 2020-11-24 MED ORDER — GABAPENTIN 100 MG PO CAPS: 200.0000 mg | ORAL_CAPSULE | Freq: Two times a day (BID) | ORAL | 0 refills | Status: DC

## 2020-11-24 MED ORDER — POLYETHYLENE GLYCOL 3350 17 G PO PACK: 17.0000 g | PACK | Freq: Every day | ORAL | 0 refills | Status: DC

## 2020-11-24 MED ORDER — CEFDINIR 300 MG PO CAPS
300.0000 mg | ORAL_CAPSULE | Freq: Two times a day (BID) | ORAL | 0 refills | Status: AC
Start: 2020-11-24 — End: 2020-12-01

## 2020-11-24 MED ORDER — DOXYCYCLINE HYCLATE 100 MG PO TABS
100.0000 mg | ORAL_TABLET | Freq: Two times a day (BID) | ORAL | 0 refills | Status: AC
Start: 2020-11-24 — End: 2020-12-01

## 2020-11-24 NOTE — TOC Transition Note (Addendum)
Transition of Care [facility]) - CM/SW Discharge Note   Patient Details  Name: [name] MRN: [id number] Date of Birth: 13-Jul-1955  [facility] CM/SW Contact:  Dessa Phi, RN Phone Number: 11/24/2020, 11:09 AM   Clinical Narrative:  Noted  awaiting dtr to get into house safely. No CM needs. 2:38p-[employer] rep Zelphia Cairo will p/u home 02 travel tank-patient doesn't need.No further CM needs.    Final next level of care: Home/Self Care Barriers to Discharge: No Barriers Identified   Patient Goals and CMS Choice Patient states their goals for this hospitalization and ongoing recovery are:: go home CMS Medicare.gov Compare Post Acute Care list provided to:: Patient Represenative (must comment) (rolanda dtr [phone]) Choice offered to / list presented to : Adult Children  Discharge Placement                       Discharge Plan and Services   Discharge Planning Services: CM Consult                                 Social Determinants of Health (SDOH) Interventions     Readmission Risk Interventions Readmission Risk Prevention Plan 11/13/2020 11/10/2020  Transportation Screening Complete Complete  PCP or Specialist Appt within 5-7 Days - Complete  PCP or Specialist Appt within 3-5 Days Complete -  Home Care Screening - Complete  Medication Review (RN CM) - Complete  HRI or Home Care Consult Complete -  Social Work Consult for Recovery Care Planning/Counseling Complete -  Palliative Care Screening Complete -  Medication Review Press photographer) Complete -  Some recent data might be hidden

## 2020-11-24 NOTE — Progress Notes (Signed)
The patient refused the Pneumococcal Vaccination and Flu vaccination prior to discharge.

## 2020-11-24 NOTE — TOC Progression Note (Signed)
Transition of Care [facility]) - Progression Note    Patient Details  Name: Perl Folmar MRN: [id number] Date of Birth: 1955-01-18  Transition [facility]) CM/SW Contact  Lucifer Soja, Juliann Pulse, RN Phone Number: 11/24/2020, 10:04 AM  Clinical Narrative: noted on room air. Monitor for d/c needs.      Expected Discharge Plan: Home/Self Care Barriers to Discharge: Continued Medical Work up  Expected Discharge Plan and Services Expected Discharge Plan: Home/Self Care   Discharge Planning Services: CM Consult   Living arrangements for the past 2 months: Single Family Home Expected Discharge Date:  (unknown)                                     Social Determinants of Health (SDOH) Interventions    Readmission Risk Interventions Readmission Risk Prevention Plan 11/13/2020 11/10/2020  Transportation Screening Complete Complete  PCP or Specialist Appt within 5-7 Days - Complete  PCP or Specialist Appt within 3-5 Days Complete -  Home Care Screening - Complete  Medication Review (RN CM) - Complete  HRI or Home Care Consult Complete -  Social Work Consult for Recovery Care Planning/Counseling Complete -  Palliative Care Screening Complete -  Medication Review Press photographer) Complete -  Some recent data might be hidden
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
## 2020-11-24 NOTE — Progress Notes (Signed)
At discharge, the patient departed with daughter  to transport the patient home . The patient was discharged with discharge instructions,  belongings and verbalized understanding of all instructions before departure. VSS upon discharge. All hospital equipment was removed from the patient. The patient and daughter endorse no additional questions at this time.

## 2020-11-26 ENCOUNTER — Emergency Department (HOSPITAL_COMMUNITY): Payer: Medicare Other

## 2020-11-26 ENCOUNTER — Other Ambulatory Visit: Payer: Self-pay

## 2020-11-26 ENCOUNTER — Emergency Department (HOSPITAL_COMMUNITY)
Admission: EM | Admit: 2020-11-26 | Discharge: 2020-11-26 | Disposition: A | Discharge status: Home / Self Care | Payer: Medicare Other | Attending: Emergency Medicine | Admitting: Emergency Medicine

## 2020-11-26 ENCOUNTER — Encounter (HOSPITAL_COMMUNITY): Payer: Self-pay

## 2020-11-26 DIAGNOSIS — Z7952 Long term (current) use of systemic steroids: Secondary | ICD-10-CM | POA: Insufficient documentation

## 2020-11-26 DIAGNOSIS — K219 Gastro-esophageal reflux disease without esophagitis: Secondary | ICD-10-CM | POA: Insufficient documentation

## 2020-11-26 DIAGNOSIS — F17210 Nicotine dependence, cigarettes, uncomplicated: Secondary | ICD-10-CM | POA: Insufficient documentation

## 2020-11-26 DIAGNOSIS — J449 Chronic obstructive pulmonary disease, unspecified: Secondary | ICD-10-CM | POA: Insufficient documentation

## 2020-11-26 DIAGNOSIS — I129 Hypertensive chronic kidney disease with stage 1 through stage 4 chronic kidney disease, or unspecified chronic kidney disease: Secondary | ICD-10-CM | POA: Insufficient documentation

## 2020-11-26 DIAGNOSIS — Z79899 Other long term (current) drug therapy: Secondary | ICD-10-CM | POA: Diagnosis not present

## 2020-11-26 DIAGNOSIS — N1831 Chronic kidney disease, stage 3a: Secondary | ICD-10-CM | POA: Insufficient documentation

## 2020-11-26 DIAGNOSIS — R1033 Periumbilical pain: Secondary | ICD-10-CM | POA: Insufficient documentation

## 2020-11-26 DIAGNOSIS — R1030 Lower abdominal pain, unspecified: Secondary | ICD-10-CM | POA: Diagnosis present

## 2020-11-26 DIAGNOSIS — R1032 Left lower quadrant pain: Secondary | ICD-10-CM | POA: Insufficient documentation

## 2020-11-26 DIAGNOSIS — R1031 Right lower quadrant pain: Secondary | ICD-10-CM

## 2020-11-26 LAB — COMPREHENSIVE METABOLIC PANEL
ALT: 12 U/L (ref 0–44)
AST: 22 U/L (ref 15–41)
Albumin: 3.2 g/dL — ABNORMAL LOW (ref 3.5–5.0)
Alkaline Phosphatase: 88 U/L (ref 38–126)
Anion gap: 10 (ref 5–15)
BUN: 9 mg/dL (ref 8–23)
CO2: 21 mmol/L — ABNORMAL LOW (ref 22–32)
Calcium: 9.2 mg/dL (ref 8.9–10.3)
Chloride: 104 mmol/L (ref 98–111)
Creatinine, Ser: 0.68 mg/dL (ref 0.44–1.00)
GFR, Estimated: >60 mL/min (ref >60)
Glucose, Bld: 105 mg/dL — ABNORMAL HIGH (ref 70–99)
Potassium: 4 mmol/L (ref 3.5–5.1)
Sodium: 135 mmol/L (ref 135–145)
Total Bilirubin: 0.4 mg/dL (ref 0.3–1.2)
Total Protein: 9.4 g/dL — ABNORMAL HIGH (ref 6.5–8.1)

## 2020-11-26 LAB — CBC WITH DIFFERENTIAL/PLATELET
Abs Immature Granulocytes: 0.02 10*3/uL (ref 0.00–0.07)
Basophils Absolute: 0 10*3/uL (ref 0.0–0.1)
Basophils Relative: 1 %
Eosinophils Absolute: 0.2 10*3/uL (ref 0.0–0.5)
Eosinophils Relative: 3 %
HCT: 40.7 % (ref 36.0–46.0)
Hemoglobin: 13 g/dL (ref 12.0–15.0)
Immature Granulocytes: 0 %
Lymphocytes Relative: 25 %
Lymphs Abs: 1.5 10*3/uL (ref 0.7–4.0)
MCH: 29.2 pg (ref 26.0–34.0)
MCHC: 31.9 g/dL (ref 30.0–36.0)
MCV: 91.5 fL (ref 80.0–100.0)
Monocytes Absolute: 0.3 10*3/uL (ref 0.1–1.0)
Monocytes Relative: 5 %
Neutro Abs: 3.9 10*3/uL (ref 1.7–7.7)
Neutrophils Relative %: 66 %
Platelets: 569 10*3/uL — ABNORMAL HIGH (ref 150–400)
RBC: 4.45 MIL/uL (ref 3.87–5.11)
RDW: 14.5 % (ref 11.5–15.5)
WBC: 5.9 10*3/uL (ref 4.0–10.5)
nRBC: 0 % (ref 0.0–0.2)

## 2020-11-26 LAB — LACTIC ACID, PLASMA: Lactic Acid, Venous: 1.6 mmol/L (ref 0.5–1.9)

## 2020-11-26 LAB — LIPASE, BLOOD: Lipase: 25 U/L (ref 11–51)

## 2020-11-26 MED ORDER — PIPERACILLIN-TAZOBACTAM 3.375 G IVPB 30 MIN
3.3750 g | Freq: Once | INTRAVENOUS | Status: AC
  Administered 2020-11-26: 3.375 g via INTRAVENOUS
  Filled 2020-11-26: qty 50

## 2020-11-26 MED ORDER — FENTANYL CITRATE PF 50 MCG/ML IJ SOSY
50.0000 ug | PREFILLED_SYRINGE | Freq: Once | INTRAMUSCULAR | Status: AC
  Administered 2020-11-26: 50 ug via INTRAVENOUS
  Filled 2020-11-26: qty 1

## 2020-11-26 NOTE — Discharge Instructions (Signed)
The testing today shows that your problems are improving.  Unfortunately you missed the appointment with the surgeons, yesterday.  Call them today to schedule a follow-up appointment as soon as possible.  Continue taking your currently prescribed medications.  Try to eat and drink regularly.  Follow-up with your primary care doctor, as needed for problems.

## 2020-11-26 NOTE — ED Provider Notes (Signed)
7:20 AM-checkout from Dr. Wyvonnia Dusky to evaluate patient after CT imaging complaint.  Complaint today is abdominal pain.  Patient had recent complicated appendectomy with residual intra-abdominal infection, treated with drainage and antibiotics.  Also hospitalized and treated for pleural effusion, discharged, 2 days ago.  Currently on oral antibiotics, broad-spectrum.  Apparently intra-abdominal drain removed about 2 weeks ago.  He does not appear that she has seen general surgery since then.  8:15 AM-CT today indicates improving right lower quadrant edema/phlegmon, and decreasing right pleural effusion.  At this time the abdomen is soft and mildly tender in the right lower quadrant.  Findings discussed with the patient and all questions were answered  MDM-patient presenting with nonspecific symptoms, comprehensive evaluation today is reassuring.  Doubt progressive intra-abdominal or pleural infection.  No indication for further ED intervention or hospitalization at this time.  Patient missed her outpatient surgery follow-up yesterday with [facility] surgery.  I have encouraged her to follow-up with them as soon as possible.     Daleen Bo, MD 11/26/20 [phone]

## 2020-11-26 NOTE — ED Notes (Signed)
Ptar called, states pt is next on the list

## 2020-11-26 NOTE — ED Triage Notes (Signed)
Pt BIB EMS from home, reports with abdominal pressure x several days. Pt reports having a recent appendectomy.

## 2020-11-26 NOTE — ED Notes (Signed)
Pt voided before UA was put in
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
## 2020-12-10 ENCOUNTER — Encounter (HOSPITAL_COMMUNITY): Payer: Self-pay

## 2020-12-10 ENCOUNTER — Other Ambulatory Visit: Payer: Self-pay

## 2020-12-10 ENCOUNTER — Emergency Department (HOSPITAL_COMMUNITY): Payer: Medicare Other

## 2020-12-10 ENCOUNTER — Emergency Department (HOSPITAL_COMMUNITY)
Admission: EM | Admit: 2020-12-10 | Discharge: 2020-12-10 | Disposition: A | Discharge status: Home / Self Care | Payer: Medicare Other | Attending: Emergency Medicine | Admitting: Emergency Medicine

## 2020-12-10 DIAGNOSIS — N1831 Chronic kidney disease, stage 3a: Secondary | ICD-10-CM | POA: Diagnosis not present

## 2020-12-10 DIAGNOSIS — M25562 Pain in left knee: Secondary | ICD-10-CM | POA: Diagnosis not present

## 2020-12-10 DIAGNOSIS — R1084 Generalized abdominal pain: Secondary | ICD-10-CM | POA: Diagnosis present

## 2020-12-10 DIAGNOSIS — R1031 Right lower quadrant pain: Secondary | ICD-10-CM | POA: Diagnosis not present

## 2020-12-10 DIAGNOSIS — J449 Chronic obstructive pulmonary disease, unspecified: Secondary | ICD-10-CM | POA: Diagnosis not present

## 2020-12-10 DIAGNOSIS — I129 Hypertensive chronic kidney disease with stage 1 through stage 4 chronic kidney disease, or unspecified chronic kidney disease: Secondary | ICD-10-CM | POA: Insufficient documentation

## 2020-12-10 DIAGNOSIS — I10 Essential (primary) hypertension: Secondary | ICD-10-CM

## 2020-12-10 DIAGNOSIS — R21 Rash and other nonspecific skin eruption: Secondary | ICD-10-CM | POA: Insufficient documentation

## 2020-12-10 DIAGNOSIS — Z7951 Long term (current) use of inhaled steroids: Secondary | ICD-10-CM | POA: Diagnosis not present

## 2020-12-10 DIAGNOSIS — Z79899 Other long term (current) drug therapy: Secondary | ICD-10-CM | POA: Insufficient documentation

## 2020-12-10 DIAGNOSIS — M25561 Pain in right knee: Secondary | ICD-10-CM | POA: Diagnosis not present

## 2020-12-10 LAB — CBC
HCT: 40 % (ref 36.0–46.0)
Hemoglobin: 12.9 g/dL (ref 12.0–15.0)
MCH: 29.3 pg (ref 26.0–34.0)
MCHC: 32.3 g/dL (ref 30.0–36.0)
MCV: 90.9 fL (ref 80.0–100.0)
Platelets: 266 10*3/uL (ref 150–400)
RBC: 4.4 MIL/uL (ref 3.87–5.11)
RDW: 14.8 % (ref 11.5–15.5)
WBC: 4.6 10*3/uL (ref 4.0–10.5)
nRBC: 0 % (ref 0.0–0.2)

## 2020-12-10 LAB — COMPREHENSIVE METABOLIC PANEL
ALT: 11 U/L (ref 0–44)
AST: 17 U/L (ref 15–41)
Albumin: 3.4 g/dL — ABNORMAL LOW (ref 3.5–5.0)
Alkaline Phosphatase: 124 U/L (ref 38–126)
Anion gap: 6 (ref 5–15)
BUN: 17 mg/dL (ref 8–23)
CO2: 22 mmol/L (ref 22–32)
Calcium: 9 mg/dL (ref 8.9–10.3)
Chloride: 108 mmol/L (ref 98–111)
Creatinine, Ser: 0.79 mg/dL (ref 0.44–1.00)
GFR, Estimated: >60 mL/min (ref >60)
Glucose, Bld: 94 mg/dL (ref 70–99)
Potassium: 3.5 mmol/L (ref 3.5–5.1)
Sodium: 136 mmol/L (ref 135–145)
Total Bilirubin: 0.6 mg/dL (ref 0.3–1.2)
Total Protein: 8.1 g/dL (ref 6.5–8.1)

## 2020-12-10 LAB — URINALYSIS, ROUTINE W REFLEX MICROSCOPIC
Bilirubin Urine: NEGATIVE
Glucose, UA: NEGATIVE mg/dL
Hgb urine dipstick: NEGATIVE
Ketones, ur: NEGATIVE mg/dL
Nitrite: NEGATIVE
Protein, ur: NEGATIVE mg/dL
Specific Gravity, Urine: 1.026 (ref 1.005–1.030)
pH: 5 (ref 5.0–8.0)

## 2020-12-10 LAB — LIPASE, BLOOD: Lipase: 29 U/L (ref 11–51)

## 2020-12-10 MED ORDER — AMLODIPINE BESYLATE 5 MG PO TABS
10.0000 mg | ORAL_TABLET | Freq: Once | ORAL | Status: AC
  Administered 2020-12-10: 10 mg via ORAL
  Filled 2020-12-10: qty 2

## 2020-12-10 MED ORDER — HYDROMORPHONE HCL 1 MG/ML IJ SOLN
1.0000 mg | Freq: Once | INTRAMUSCULAR | Status: AC
  Administered 2020-12-10: 1 mg via INTRAVENOUS
  Filled 2020-12-10: qty 1

## 2020-12-10 MED ORDER — SODIUM CHLORIDE 0.9 % IV BOLUS (SEPSIS)
1000.0000 mL | Freq: Once | INTRAVENOUS | Status: AC
  Administered 2020-12-10: 1000 mL via INTRAVENOUS

## 2020-12-10 MED ORDER — DICYCLOMINE HCL 20 MG PO TABS: 20.0000 mg | ORAL_TABLET | Freq: Two times a day (BID) | ORAL | 0 refills | Status: DC

## 2020-12-10 MED ORDER — ONDANSETRON HCL 4 MG/2ML IJ SOLN
4.0000 mg | Freq: Once | INTRAMUSCULAR | Status: AC
  Administered 2020-12-10: 4 mg via INTRAVENOUS
  Filled 2020-12-10: qty 2

## 2020-12-10 MED ORDER — ETODOLAC 300 MG PO CAPS: 300.0000 mg | ORAL_CAPSULE | Freq: Three times a day (TID) | ORAL | 0 refills | Status: DC

## 2020-12-10 MED ORDER — SODIUM CHLORIDE 0.9 % IV SOLN
1000.0000 mL | INTRAVENOUS | Status: DC
  Administered 2020-12-10: 1000 mL via INTRAVENOUS

## 2020-12-10 MED ORDER — FLUCONAZOLE 200 MG PO TABS: 200.0000 mg | ORAL_TABLET | Freq: Every day | ORAL | 0 refills | Status: AC
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
## 2020-12-10 NOTE — Discharge Instructions (Addendum)
Take the diflucan to help with the rash.  Make sure to follow up with the General surgery doctors at their office.

## 2020-12-10 NOTE — ED Triage Notes (Signed)
Pt BIB EMS from doctor's office. Pt reports having her appendix removed about a month ago. She reports abdominal pain on and off since then. She also endorses bilateral knee pain. Reports hx of RA. Doctor's office sent her here for rehab placement due to being unable to get around on her own and living at home alone.
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
## 2020-12-22 ENCOUNTER — Inpatient Hospital Stay: Payer: Medicare Other | Admitting: Student

## 2020-12-23 LAB — MISC LABCORP TEST (SEND OUT): Labcorp test code: 9985

## 2021-01-09 ENCOUNTER — Emergency Department (HOSPITAL_COMMUNITY): Payer: Medicare Other

## 2021-01-09 ENCOUNTER — Encounter (HOSPITAL_COMMUNITY): Payer: Self-pay

## 2021-01-09 ENCOUNTER — Emergency Department (HOSPITAL_COMMUNITY)
Admission: EM | Admit: 2021-01-09 | Discharge: 2021-01-09 | Disposition: A | Discharge status: Home / Self Care | Payer: Medicare Other | Attending: Emergency Medicine | Admitting: Emergency Medicine

## 2021-01-09 ENCOUNTER — Other Ambulatory Visit: Payer: Self-pay

## 2021-01-09 DIAGNOSIS — N390 Urinary tract infection, site not specified: Secondary | ICD-10-CM | POA: Insufficient documentation

## 2021-01-09 DIAGNOSIS — I129 Hypertensive chronic kidney disease with stage 1 through stage 4 chronic kidney disease, or unspecified chronic kidney disease: Secondary | ICD-10-CM | POA: Insufficient documentation

## 2021-01-09 DIAGNOSIS — L299 Pruritus, unspecified: Secondary | ICD-10-CM | POA: Insufficient documentation

## 2021-01-09 DIAGNOSIS — N1831 Chronic kidney disease, stage 3a: Secondary | ICD-10-CM | POA: Insufficient documentation

## 2021-01-09 DIAGNOSIS — R1031 Right lower quadrant pain: Secondary | ICD-10-CM | POA: Diagnosis present

## 2021-01-09 DIAGNOSIS — K219 Gastro-esophageal reflux disease without esophagitis: Secondary | ICD-10-CM | POA: Insufficient documentation

## 2021-01-09 DIAGNOSIS — F17210 Nicotine dependence, cigarettes, uncomplicated: Secondary | ICD-10-CM | POA: Diagnosis not present

## 2021-01-09 DIAGNOSIS — R319 Hematuria, unspecified: Secondary | ICD-10-CM

## 2021-01-09 DIAGNOSIS — Z79899 Other long term (current) drug therapy: Secondary | ICD-10-CM | POA: Insufficient documentation

## 2021-01-09 LAB — RAPID URINE DRUG SCREEN, HOSP PERFORMED
Amphetamines: NOT DETECTED
Barbiturates: NOT DETECTED
Benzodiazepines: NOT DETECTED
Cocaine: POSITIVE — AB
Opiates: NOT DETECTED
Tetrahydrocannabinol: NOT DETECTED

## 2021-01-09 LAB — URINALYSIS, ROUTINE W REFLEX MICROSCOPIC
Bilirubin Urine: NEGATIVE
Glucose, UA: NEGATIVE mg/dL
Ketones, ur: NEGATIVE mg/dL
Nitrite: NEGATIVE
Protein, ur: 30 mg/dL — AB
Specific Gravity, Urine: 1.017 (ref 1.005–1.030)
Squamous Epithelial / HPF: >50 — ABNORMAL HIGH (ref 0–5)
pH: 6 (ref 5.0–8.0)

## 2021-01-09 LAB — COMPREHENSIVE METABOLIC PANEL
ALT: 6 U/L (ref 0–44)
AST: 13 U/L — ABNORMAL LOW (ref 15–41)
Albumin: 3.6 g/dL (ref 3.5–5.0)
Alkaline Phosphatase: 87 U/L (ref 38–126)
Anion gap: 11 (ref 5–15)
BUN: 18 mg/dL (ref 8–23)
CO2: 18 mmol/L — ABNORMAL LOW (ref 22–32)
Calcium: 8.7 mg/dL — ABNORMAL LOW (ref 8.9–10.3)
Chloride: 103 mmol/L (ref 98–111)
Creatinine, Ser: 0.78 mg/dL (ref 0.44–1.00)
GFR, Estimated: >60 mL/min (ref >60)
Glucose, Bld: 88 mg/dL (ref 70–99)
Potassium: 3.3 mmol/L — ABNORMAL LOW (ref 3.5–5.1)
Sodium: 132 mmol/L — ABNORMAL LOW (ref 135–145)
Total Bilirubin: 0.6 mg/dL (ref 0.3–1.2)
Total Protein: 7.5 g/dL (ref 6.5–8.1)

## 2021-01-09 LAB — CBC WITH DIFFERENTIAL/PLATELET
Abs Immature Granulocytes: 0.01 10*3/uL (ref 0.00–0.07)
Basophils Absolute: 0 10*3/uL (ref 0.0–0.1)
Basophils Relative: 1 %
Eosinophils Absolute: 0.2 10*3/uL (ref 0.0–0.5)
Eosinophils Relative: 3 %
HCT: 39.6 % (ref 36.0–46.0)
Hemoglobin: 12.7 g/dL (ref 12.0–15.0)
Immature Granulocytes: 0 %
Lymphocytes Relative: 45 %
Lymphs Abs: 2.7 10*3/uL (ref 0.7–4.0)
MCH: 30.4 pg (ref 26.0–34.0)
MCHC: 32.1 g/dL (ref 30.0–36.0)
MCV: 94.7 fL (ref 80.0–100.0)
Monocytes Absolute: 0.5 10*3/uL (ref 0.1–1.0)
Monocytes Relative: 8 %
Neutro Abs: 2.5 10*3/uL (ref 1.7–7.7)
Neutrophils Relative %: 43 %
Platelets: 286 10*3/uL (ref 150–400)
RBC: 4.18 MIL/uL (ref 3.87–5.11)
RDW: 15.7 % — ABNORMAL HIGH (ref 11.5–15.5)
WBC: 5.9 10*3/uL (ref 4.0–10.5)
nRBC: 0 % (ref 0.0–0.2)

## 2021-01-09 MED ORDER — CEPHALEXIN 500 MG PO CAPS: 500.0000 mg | ORAL_CAPSULE | Freq: Three times a day (TID) | ORAL | 0 refills | Status: AC

## 2021-01-09 MED ORDER — DIPHENHYDRAMINE HCL 50 MG/ML IJ SOLN
50.0000 mg | Freq: Once | INTRAMUSCULAR | Status: AC
  Administered 2021-01-09: 17:00:00 50 mg via INTRAVENOUS
  Filled 2021-01-09: qty 1

## 2021-01-09 MED ORDER — HYDROCORTISONE SOD SUC (PF) 250 MG IJ SOLR
200.0000 mg | Freq: Once | INTRAMUSCULAR | Status: AC
  Administered 2021-01-09: 17:00:00 200 mg via INTRAVENOUS
  Filled 2021-01-09: qty 200

## 2021-01-09 MED ORDER — DIPHENHYDRAMINE HCL 25 MG PO CAPS: 50.0000 mg | ORAL_CAPSULE | Freq: Once | ORAL | Status: AC

## 2021-01-09 NOTE — Progress Notes (Signed)
Consult received from general surgery for possible RLQ drain placement.   CT reviewed by Dr. Vernard Gambles, anatomy may amendable but recommend CT abdomen and pelvis with oral and IV contrast for further evaluation.   General surgery notified vis secure chat.  Patient may need 13 hr prep due to contrast allergy.  Please place an order for drain placement and contact on call radiologist for further discussion over the weekend.   Armando Gang Junko Ohagan PA-C 01/09/2021 3:47 PM
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
## 2021-01-09 NOTE — Progress Notes (Signed)
Patient ID: [name], female   DOB: 1955-11-06, 65 y.o.   MRN: [id number] [facility] Progress Note     Subjective: CC-  [name] is a 65yo female 2 months s/p laparoscopic lysis of adhesions, appendectomy, washout and drain placement 11/03/20 by Dr. Kae Heller for perforated appendicitis with feculent and purulent peritonitis. She was readmitted in November with a right pleural effusion requiring thoracentesis. Patient returns to the ED complaining of RLQ pain, as well as itching discomfort in her hair and scalp. States that her abdominal pain has been present since surgery. It is RLQ and worse with palpation or walking. States that she has not eaten in 3 days because of the pain. Some nausea, no emesis. She had 2 loose stools yesterday, and 3 watery stools today. Tried smoking crack yesterday for pain control which helped for 30 minutes. She also reports decrease urine output but no dysuria.  She has had multiple CT scans of her abdomen/pelvis postop. Today it shows a predominantly fluid density masslike lesion in the RLQ inseparable from the cecum measuring 6.2 x4.5 x 5.6 cm, question postsurgical seroma. WBC 5.9.   Objective: Vital signs in last 24 hours: Temp:  [97.7 F (36.5 C)] 97.7 F (36.5 C) (12/30 0803) Pulse Rate:  [85-95] 95 (12/30 1130) Resp:  [13-18] 13 (12/30 1130) BP: (132-158)/(84-92) 145/86 (12/30 1130) SpO2:  [97 %-100 %] 100 % (12/30 1130) Weight:  [61.2 kg] 61.2 kg (12/30 0820)    Intake/Output from previous day: No intake/output data recorded. Intake/Output this shift: No intake/output data recorded.  PE: Gen:  Alert, NAD, pleasant HEENT: EOM's intact, pupils equal and round Card:  RRR Pulm:  CTAB, no W/R/R, rate and effort normal Abd: Soft, ND, +BS, no HSM, no hernia. TTP RLQ, no diffuse tenderness and no peritonitis Ext:  no BUE/BLE edema, calves soft and nontender  Lab Results:  Recent Labs    01/09/21 0923  WBC 5.9  HGB 12.7   HCT 39.6  PLT 286   BMET Recent Labs    01/09/21 0923  NA 132*  K 3.3*  CL 103  CO2 18*  GLUCOSE 88  BUN 18  CREATININE 0.78  CALCIUM 8.7*   PT/INR No results for input(s): LABPROT, INR in the last 72 hours. CMP     Component Value Date/Time   NA 132 (L) 01/09/2021 0923   NA 141 06/06/2015 0000   K 3.3 (L) 01/09/2021 0923   K 4.7 06/13/2015 0000   CL 103 01/09/2021 0923   CO2 18 (L) 01/09/2021 0923   GLUCOSE 88 01/09/2021 0923   BUN 18 01/09/2021 0923   BUN 8 06/06/2015 0000   CREATININE 0.78 01/09/2021 0923   CALCIUM 8.7 (L) 01/09/2021 0923   PROT 7.5 01/09/2021 0923   ALBUMIN 3.6 01/09/2021 0923   AST 13 (L) 01/09/2021 0923   ALT 6 01/09/2021 0923   ALKPHOS 87 01/09/2021 0923   BILITOT 0.6 01/09/2021 0923   GFRNONAA >60 01/09/2021 0923   GFRAA >60 06/28/2018 0535   Lipase     Component Value Date/Time   LIPASE 29 12/10/2020 1224       Studies/Results: CT Abdomen Pelvis Wo Contrast  Result Date: 01/09/2021 CLINICAL DATA:  Status post appendectomy 2 months ago with increasing right lower quadrant abdomen pain. EXAM: CT ABDOMEN AND PELVIS WITHOUT CONTRAST TECHNIQUE: Multidetector CT imaging of the abdomen and pelvis was performed following the standard protocol without IV contrast. COMPARISON:  December 10, 2020 FINDINGS: Lower chest: Atelectasis  the right lung base is identified. Minimal right pleural effusion is noted. Mild pericardial effusion is noted. Hepatobiliary: No focal liver abnormality is seen. No gallstones, gallbladder wall thickening, or biliary dilatation. Pancreas: Unremarkable. No pancreatic ductal dilatation or surrounding inflammatory changes. Spleen: Normal in size without focal abnormality. Adrenals/Urinary Tract: Adrenal glands are unremarkable. Kidneys are normal, without renal calculi, focal lesion, or hydronephrosis. Bladder is unremarkable. Stomach/Bowel: There is a large hiatal hernia. In the right lower quadrant, there is a  predominantly fluid density masslike lesion inseparable from the cecum measuring 6.2 x 4.5 x 5.6 cm, question postsurgical seroma. Evaluation is limited due to noncontrast technique. There is no small bowel obstruction or diverticulitis. Vascular/Lymphatic: Aortic atherosclerosis. No enlarged abdominal or pelvic lymph nodes. Reproductive: Status post hysterectomy. No adnexal masses. Other: No ascites or pneumoperitoneum is noted. Musculoskeletal: Degenerative joint changes of the spine are identified. Metallic density of the sacrum is unchanged compared prior exam. IMPRESSION: 1. In the right lower quadrant, there is a predominantly fluid density masslike lesion inseparable from the cecum measuring 6.2 x 4.5 x 5.6 cm, question postsurgical seroma. Evaluation is limited due to noncontrast technique. 2. Large hiatal hernia. 3. Aortic atherosclerosis. Aortic Atherosclerosis (ICD10-I70.0). Electronically Signed   By: Abelardo Diesel M.D.   On: 01/09/2021 10:10   DG Chest 2 View  Result Date: 01/09/2021 CLINICAL DATA:  Fever EXAM: CHEST - 2 VIEW COMPARISON:  November 26, 2020 FINDINGS: The mediastinal contour and cardiac silhouette are normal. Hiatal hernia is identified. Probable minimal right pleural effusion is noted. Mild focal scar is identified in the lateral right lung base. The left lung clear. IMPRESSION: Probable minimal right pleural effusion. Electronically Signed   By: Abelardo Diesel M.D.   On: 01/09/2021 10:11    Anti-infectives: Anti-infectives (From admission, onward)    None        Assessment/Plan Perforated appendicitis with feculent and purulent peritonitis  S/P laparoscopic LOA x30 min, laparoscopic appendectomy, and washout and drain placement 11/03/20  RLQ collection vs phlegmon with persistent abdominal pain  - CT today with collection that is 6.2 x 4.5 x 5.6 cm, question seroma vs hematoma vs mass vs abscess - no leukocytosis and afebrile - Reviewed with MD. Will obtain CT scan  with oral contrast for further evaluation.  Hx of E coli bacteremia COPD Rheumatoid arthritis  HTN HLD GERD CKD  Anemia  Tobacco abuse Cocaine use  LOS: 0 days    Wellington Hampshire, [facility] Surgery 01/09/2021, 11:37 AM Please see Amion for pager number during day hours 7:00am-4:30pm

## 2021-01-09 NOTE — Discharge Instructions (Signed)
Call your primary care doctor or specialist as discussed in the next 2-3 days.   Return immediately back to the ER if:  Your symptoms worsen within the next 12-24 hours. You develop new symptoms such as new fevers, persistent vomiting, new pain, shortness of breath, or new weakness or numbness, or if you have any other concerns.  

## 2021-01-09 NOTE — ED Provider Notes (Signed)
Repeat scan shows no fluid collection.  Case to be discussed with surgical attending on call who agrees with continued outpatient management.  Patient does have urinary tract infection, given antibiotics for this and discharged home in stable condition.  Advised return for fevers worsening symptoms or any additional concerns.    Luna Fuse, MD 01/09/21 [phone]

## 2021-01-09 NOTE — ED Triage Notes (Addendum)
Patient states, "my skin is making me sick." Patient states, "My skin makes me not pee and it won't let me use the bathroom."  Patient denies any itching, but states, "my skin hurts."  Further questioning, patient states she "pees a little bit at a time."  Patient also admits to doing "crack" because she can not get pain meds and last used yesterday.

## 2021-03-06 ENCOUNTER — Other Ambulatory Visit: Payer: Self-pay

## 2021-03-06 ENCOUNTER — Ambulatory Visit (HOSPITAL_COMMUNITY)
Admission: EM | Admit: 2021-03-06 | Discharge: 2021-03-06 | Disposition: A | Discharge status: Home / Self Care | Payer: Medicare Other | Attending: Physician Assistant | Admitting: Physician Assistant

## 2021-03-06 ENCOUNTER — Encounter (HOSPITAL_COMMUNITY): Payer: Self-pay | Admitting: Emergency Medicine

## 2021-03-06 DIAGNOSIS — R8290 Unspecified abnormal findings in urine: Secondary | ICD-10-CM | POA: Insufficient documentation

## 2021-03-06 DIAGNOSIS — R350 Frequency of micturition: Secondary | ICD-10-CM | POA: Diagnosis present

## 2021-03-06 LAB — POCT URINALYSIS DIPSTICK, ED / UC
Bilirubin Urine: NEGATIVE
Glucose, UA: NEGATIVE mg/dL
Hgb urine dipstick: NEGATIVE
Leukocytes,Ua: NEGATIVE
Nitrite: POSITIVE — AB
Protein, ur: NEGATIVE mg/dL
Specific Gravity, Urine: 1.02 (ref 1.005–1.030)
Urobilinogen, UA: 0.2 mg/dL (ref 0.0–1.0)
pH: 5 (ref 5.0–8.0)

## 2021-03-06 MED ORDER — CEPHALEXIN 500 MG PO CAPS: 500.0000 mg | ORAL_CAPSULE | Freq: Three times a day (TID) | ORAL | 0 refills | Status: DC

## 2021-03-06 NOTE — Discharge Instructions (Signed)
Your urine does look like you have a urinary tract infection so we are going to start Keflex 3 times a day for 1 week.  I am concerned that your bladder is not in the correct position after your hysterectomy many years ago and think you should follow-up with a urologist.  Please call to schedule an appointment with them and see them soon as possible.  We will contact you if your lab work is abnormal we need to change her treatment.  If you have any worsening symptoms including high fever, severe abdominal pain, nausea/vomiting interfere with oral intake, inability to pass urine you need to go to the emergency room.
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
## 2021-03-06 NOTE — ED Triage Notes (Signed)
Pt presents with lower abdominal pain, urinary frequency and difficulty urinating xs 2-3 months.

## 2021-03-09 LAB — CERVICOVAGINAL ANCILLARY ONLY
Bacterial Vaginitis (gardnerella): NEGATIVE
Candida Glabrata: NEGATIVE
Candida Vaginitis: POSITIVE — AB
Chlamydia: NEGATIVE
Comment: NEGATIVE
Comment: NEGATIVE
Comment: NEGATIVE
Comment: NEGATIVE
Comment: NEGATIVE
Comment: NORMAL
Neisseria Gonorrhea: NEGATIVE
Trichomonas: NEGATIVE

## 2021-03-09 LAB — URINE CULTURE: Culture: >=100000 — AB

## 2021-03-10 ENCOUNTER — Telehealth (HOSPITAL_COMMUNITY): Payer: Self-pay | Admitting: Emergency Medicine

## 2021-03-10 MED ORDER — FLUCONAZOLE 150 MG PO TABS: 150.0000 mg | ORAL_TABLET | Freq: Once | ORAL | 0 refills | Status: AC

## 2021-03-23 ENCOUNTER — Other Ambulatory Visit: Payer: Self-pay | Admitting: Student

## 2021-03-23 DIAGNOSIS — R102 Pelvic and perineal pain: Secondary | ICD-10-CM

## 2021-03-23 DIAGNOSIS — R339 Retention of urine, unspecified: Secondary | ICD-10-CM

## 2021-04-02 ENCOUNTER — Emergency Department (HOSPITAL_COMMUNITY): Payer: Medicare Other

## 2021-04-02 ENCOUNTER — Encounter (HOSPITAL_COMMUNITY): Payer: Self-pay

## 2021-04-02 ENCOUNTER — Inpatient Hospital Stay (HOSPITAL_COMMUNITY)
Admission: EM | Admit: 2021-04-02 | Discharge: 2021-04-08 | DRG: 552 | Disposition: A | Discharge status: Skilled Nursing Facility | Payer: Medicare Other | Attending: Family Medicine | Admitting: Family Medicine

## 2021-04-02 ENCOUNTER — Other Ambulatory Visit: Payer: Self-pay

## 2021-04-02 DIAGNOSIS — M24569 Contracture, unspecified knee: Secondary | ICD-10-CM

## 2021-04-02 DIAGNOSIS — J449 Chronic obstructive pulmonary disease, unspecified: Secondary | ICD-10-CM | POA: Diagnosis present

## 2021-04-02 DIAGNOSIS — J410 Simple chronic bronchitis: Secondary | ICD-10-CM

## 2021-04-02 DIAGNOSIS — M720 Palmar fascial fibromatosis [Dupuytren]: Secondary | ICD-10-CM

## 2021-04-02 DIAGNOSIS — Z91048 Other nonmedicinal substance allergy status: Secondary | ICD-10-CM

## 2021-04-02 DIAGNOSIS — M069 Rheumatoid arthritis, unspecified: Secondary | ICD-10-CM | POA: Diagnosis present

## 2021-04-02 DIAGNOSIS — Z90710 Acquired absence of both cervix and uterus: Secondary | ICD-10-CM

## 2021-04-02 DIAGNOSIS — Z885 Allergy status to narcotic agent status: Secondary | ICD-10-CM

## 2021-04-02 DIAGNOSIS — B9620 Unspecified Escherichia coli [E. coli] as the cause of diseases classified elsewhere: Secondary | ICD-10-CM | POA: Diagnosis present

## 2021-04-02 DIAGNOSIS — M4802 Spinal stenosis, cervical region: Secondary | ICD-10-CM | POA: Diagnosis not present

## 2021-04-02 DIAGNOSIS — G6289 Other specified polyneuropathies: Secondary | ICD-10-CM

## 2021-04-02 DIAGNOSIS — F1410 Cocaine abuse, uncomplicated: Secondary | ICD-10-CM | POA: Diagnosis present

## 2021-04-02 DIAGNOSIS — I129 Hypertensive chronic kidney disease with stage 1 through stage 4 chronic kidney disease, or unspecified chronic kidney disease: Secondary | ICD-10-CM | POA: Diagnosis present

## 2021-04-02 DIAGNOSIS — Z20822 Contact with and (suspected) exposure to covid-19: Secondary | ICD-10-CM | POA: Diagnosis present

## 2021-04-02 DIAGNOSIS — Z825 Family history of asthma and other chronic lower respiratory diseases: Secondary | ICD-10-CM

## 2021-04-02 DIAGNOSIS — F17210 Nicotine dependence, cigarettes, uncomplicated: Secondary | ICD-10-CM | POA: Diagnosis present

## 2021-04-02 DIAGNOSIS — Z981 Arthrodesis status: Secondary | ICD-10-CM

## 2021-04-02 DIAGNOSIS — Z79899 Other long term (current) drug therapy: Secondary | ICD-10-CM

## 2021-04-02 DIAGNOSIS — E871 Hypo-osmolality and hyponatremia: Secondary | ICD-10-CM | POA: Diagnosis present

## 2021-04-02 DIAGNOSIS — N3000 Acute cystitis without hematuria: Secondary | ICD-10-CM

## 2021-04-02 DIAGNOSIS — Z8249 Family history of ischemic heart disease and other diseases of the circulatory system: Secondary | ICD-10-CM

## 2021-04-02 DIAGNOSIS — Z881 Allergy status to other antibiotic agents status: Secondary | ICD-10-CM

## 2021-04-02 DIAGNOSIS — E440 Moderate protein-calorie malnutrition: Secondary | ICD-10-CM

## 2021-04-02 DIAGNOSIS — N183 Chronic kidney disease, stage 3 unspecified: Secondary | ICD-10-CM | POA: Diagnosis present

## 2021-04-02 DIAGNOSIS — I10 Essential (primary) hypertension: Secondary | ICD-10-CM | POA: Diagnosis present

## 2021-04-02 DIAGNOSIS — R52 Pain, unspecified: Secondary | ICD-10-CM | POA: Diagnosis present

## 2021-04-02 DIAGNOSIS — Z87442 Personal history of urinary calculi: Secondary | ICD-10-CM

## 2021-04-02 DIAGNOSIS — J439 Emphysema, unspecified: Secondary | ICD-10-CM | POA: Diagnosis present

## 2021-04-02 DIAGNOSIS — Z7401 Bed confinement status: Secondary | ICD-10-CM

## 2021-04-02 DIAGNOSIS — Z9049 Acquired absence of other specified parts of digestive tract: Secondary | ICD-10-CM

## 2021-04-02 DIAGNOSIS — F32A Depression, unspecified: Secondary | ICD-10-CM | POA: Diagnosis present

## 2021-04-02 DIAGNOSIS — N390 Urinary tract infection, site not specified: Secondary | ICD-10-CM

## 2021-04-02 DIAGNOSIS — K219 Gastro-esophageal reflux disease without esophagitis: Secondary | ICD-10-CM | POA: Diagnosis present

## 2021-04-02 DIAGNOSIS — G629 Polyneuropathy, unspecified: Secondary | ICD-10-CM

## 2021-04-02 DIAGNOSIS — R221 Localized swelling, mass and lump, neck: Secondary | ICD-10-CM | POA: Diagnosis present

## 2021-04-02 DIAGNOSIS — E538 Deficiency of other specified B group vitamins: Secondary | ICD-10-CM

## 2021-04-02 DIAGNOSIS — Z888 Allergy status to other drugs, medicaments and biological substances status: Secondary | ICD-10-CM

## 2021-04-02 DIAGNOSIS — Z91199 Patient's noncompliance with other medical treatment and regimen due to unspecified reason: Secondary | ICD-10-CM

## 2021-04-02 LAB — URINALYSIS, ROUTINE W REFLEX MICROSCOPIC
Bilirubin Urine: NEGATIVE
Glucose, UA: NEGATIVE mg/dL
Hgb urine dipstick: NEGATIVE
Ketones, ur: NEGATIVE mg/dL
Nitrite: POSITIVE — AB
Protein, ur: NEGATIVE mg/dL
Specific Gravity, Urine: 1.012 (ref 1.005–1.030)
pH: 6 (ref 5.0–8.0)

## 2021-04-02 LAB — I-STAT CHEM 8, ED
BUN: 15 mg/dL (ref 8–23)
Calcium, Ion: 1.14 mmol/L — ABNORMAL LOW (ref 1.15–1.40)
Chloride: 103 mmol/L (ref 98–111)
Creatinine, Ser: 0.8 mg/dL (ref 0.44–1.00)
Glucose, Bld: 95 mg/dL (ref 70–99)
HCT: 40 % (ref 36.0–46.0)
Hemoglobin: 13.6 g/dL (ref 12.0–15.0)
Potassium: 4.2 mmol/L (ref 3.5–5.1)
Sodium: 138 mmol/L (ref 135–145)
TCO2: 27 mmol/L (ref 22–32)

## 2021-04-02 LAB — RAPID URINE DRUG SCREEN, HOSP PERFORMED
Amphetamines: NOT DETECTED
Barbiturates: NOT DETECTED
Benzodiazepines: NOT DETECTED
Cocaine: POSITIVE — AB
Opiates: POSITIVE — AB
Tetrahydrocannabinol: NOT DETECTED

## 2021-04-02 LAB — CBC
HCT: 38.7 % (ref 36.0–46.0)
Hemoglobin: 12.6 g/dL (ref 12.0–15.0)
MCH: 30.4 pg (ref 26.0–34.0)
MCHC: 32.6 g/dL (ref 30.0–36.0)
MCV: 93.5 fL (ref 80.0–100.0)
Platelets: 292 10*3/uL (ref 150–400)
RBC: 4.14 MIL/uL (ref 3.87–5.11)
RDW: 13.5 % (ref 11.5–15.5)
WBC: 5.5 10*3/uL (ref 4.0–10.5)
nRBC: 0 % (ref 0.0–0.2)

## 2021-04-02 LAB — COMPREHENSIVE METABOLIC PANEL
ALT: 9 U/L (ref 0–44)
AST: 15 U/L (ref 15–41)
Albumin: 3.7 g/dL (ref 3.5–5.0)
Alkaline Phosphatase: 114 U/L (ref 38–126)
Anion gap: 4 — ABNORMAL LOW (ref 5–15)
BUN: 16 mg/dL (ref 8–23)
CO2: 27 mmol/L (ref 22–32)
Calcium: 8.8 mg/dL — ABNORMAL LOW (ref 8.9–10.3)
Chloride: 103 mmol/L (ref 98–111)
Creatinine, Ser: 0.95 mg/dL (ref 0.44–1.00)
GFR, Estimated: >60 mL/min (ref >60)
Glucose, Bld: 103 mg/dL — ABNORMAL HIGH (ref 70–99)
Potassium: 4.5 mmol/L (ref 3.5–5.1)
Sodium: 134 mmol/L — ABNORMAL LOW (ref 135–145)
Total Bilirubin: 0.8 mg/dL (ref 0.3–1.2)
Total Protein: 7.7 g/dL (ref 6.5–8.1)

## 2021-04-02 LAB — DIFFERENTIAL
Abs Immature Granulocytes: 0.01 10*3/uL (ref 0.00–0.07)
Basophils Absolute: 0 10*3/uL (ref 0.0–0.1)
Basophils Relative: 1 %
Eosinophils Absolute: 0.5 10*3/uL (ref 0.0–0.5)
Eosinophils Relative: 9 %
Immature Granulocytes: 0 %
Lymphocytes Relative: 45 %
Lymphs Abs: 2.5 10*3/uL (ref 0.7–4.0)
Monocytes Absolute: 0.4 10*3/uL (ref 0.1–1.0)
Monocytes Relative: 7 %
Neutro Abs: 2.1 10*3/uL (ref 1.7–7.7)
Neutrophils Relative %: 38 %

## 2021-04-02 LAB — PROTIME-INR
INR: 0.9 (ref 0.8–1.2)
Prothrombin Time: 12.6 seconds (ref 11.4–15.2)

## 2021-04-02 LAB — RESP PANEL BY RT-PCR (FLU A&B, COVID) ARPGX2
Influenza A by PCR: NEGATIVE
Influenza B by PCR: NEGATIVE
SARS Coronavirus 2 by RT PCR: NEGATIVE

## 2021-04-02 LAB — APTT: aPTT: 26 seconds (ref 24–36)

## 2021-04-02 LAB — ETHANOL: Alcohol, Ethyl (B): <10 mg/dL (ref <10)

## 2021-04-02 MED ORDER — GABAPENTIN 400 MG PO CAPS
400.0000 mg | ORAL_CAPSULE | Freq: Every day | ORAL | Status: DC
  Administered 2021-04-02: 400 mg via ORAL
  Filled 2021-04-02: qty 1

## 2021-04-02 MED ORDER — SODIUM CHLORIDE 0.9 % IV SOLN
1.0000 g | Freq: Once | INTRAVENOUS | Status: AC
  Administered 2021-04-02: 1 g via INTRAVENOUS
  Filled 2021-04-02: qty 10

## 2021-04-02 MED ORDER — HYDROCORTISONE SOD SUC (PF) 250 MG IJ SOLR
200.0000 mg | Freq: Once | INTRAMUSCULAR | Status: DC
  Filled 2021-04-02: qty 200

## 2021-04-02 MED ORDER — MORPHINE SULFATE (PF) 4 MG/ML IV SOLN
4.0000 mg | Freq: Once | INTRAVENOUS | Status: AC
  Administered 2021-04-02: 4 mg via INTRAVENOUS
  Filled 2021-04-02: qty 1

## 2021-04-02 MED ORDER — LORAZEPAM 2 MG/ML IJ SOLN
1.0000 mg | INTRAMUSCULAR | Status: DC | PRN
  Administered 2021-04-02: 1 mg via INTRAVENOUS
  Filled 2021-04-02: qty 1

## 2021-04-02 MED ORDER — DIPHENHYDRAMINE HCL 25 MG PO CAPS: 50.0000 mg | ORAL_CAPSULE | Freq: Once | ORAL | Status: DC

## 2021-04-02 MED ORDER — ENOXAPARIN SODIUM 40 MG/0.4ML IJ SOSY: 40.0000 mg | PREFILLED_SYRINGE | INTRAMUSCULAR | Status: DC

## 2021-04-02 MED ORDER — ACETAMINOPHEN 325 MG PO TABS: 650.0000 mg | ORAL_TABLET | Freq: Four times a day (QID) | ORAL | Status: DC | PRN

## 2021-04-02 MED ORDER — METHYLPREDNISOLONE SODIUM SUCC 125 MG IJ SOLR
125.0000 mg | Freq: Once | INTRAMUSCULAR | Status: AC
Start: 2021-04-02 — End: 2021-04-02
  Administered 2021-04-02: 125 mg via INTRAVENOUS
  Filled 2021-04-02: qty 2

## 2021-04-02 MED ORDER — KETOROLAC TROMETHAMINE 15 MG/ML IJ SOLN
15.0000 mg | Freq: Once | INTRAMUSCULAR | Status: AC
  Administered 2021-04-02: 15 mg via INTRAVENOUS
  Filled 2021-04-02: qty 1

## 2021-04-02 MED ORDER — SODIUM CHLORIDE 0.9 % IV SOLN
1.0000 g | INTRAVENOUS | Status: AC
  Administered 2021-04-03 – 2021-04-06 (×4): 1 g via INTRAVENOUS
  Filled 2021-04-02 (×4): qty 10

## 2021-04-02 MED ORDER — LIDOCAINE 5 % EX PTCH
1.0000 | MEDICATED_PATCH | CUTANEOUS | Status: DC
  Administered 2021-04-02 – 2021-04-05 (×3): 1 via TRANSDERMAL
  Filled 2021-04-02 (×3): qty 1

## 2021-04-02 MED ORDER — OXYCODONE-ACETAMINOPHEN 5-325 MG PO TABS: 2.0000 | ORAL_TABLET | Freq: Four times a day (QID) | ORAL | Status: DC | PRN

## 2021-04-02 MED ORDER — OXYCODONE-ACETAMINOPHEN 5-325 MG PO TABS
2.0000 | ORAL_TABLET | ORAL | Status: DC | PRN
  Administered 2021-04-03 – 2021-04-08 (×20): 2 via ORAL
  Filled 2021-04-02 (×20): qty 2

## 2021-04-02 MED ORDER — FENTANYL CITRATE PF 50 MCG/ML IJ SOSY
12.5000 ug | PREFILLED_SYRINGE | INTRAMUSCULAR | Status: DC | PRN
  Administered 2021-04-03 – 2021-04-04 (×3): 12.5 ug via INTRAVENOUS
  Filled 2021-04-02 (×3): qty 1

## 2021-04-02 MED ORDER — DIPHENHYDRAMINE HCL 50 MG/ML IJ SOLN: 50.0000 mg | Freq: Once | INTRAMUSCULAR | Status: DC

## 2021-04-02 MED ORDER — METHOCARBAMOL 500 MG PO TABS
500.0000 mg | ORAL_TABLET | Freq: Four times a day (QID) | ORAL | Status: DC | PRN
  Administered 2021-04-04 – 2021-04-08 (×10): 500 mg via ORAL
  Filled 2021-04-02 (×11): qty 1

## 2021-04-02 MED ORDER — OXYCODONE HCL 5 MG PO TABS
5.0000 mg | ORAL_TABLET | ORAL | Status: DC | PRN
  Administered 2021-04-02: 5 mg via ORAL
  Filled 2021-04-02: qty 1
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
## 2021-04-02 NOTE — ED Provider Notes (Signed)
Patient is a 66 year old female whose care was transferred to me at shift change from [facility].  His HPI as below: ? ?66 year old female with significant history of hypertension, rheumatoid arthritis, COPD, GERD, CKD, polysubstance abuse, brought here via EMS from home with complaints of left-sided pain.  Patient report for the past 2 days she has had both pain and weakness involving her left arm and left leg as well as her chest.  Pain is sharp achy throbbing persistent.  States she is having difficulty picking up her left arm and states "I worried I may have a stroke".  Patient denies having any stroke in the past.  She did report that she fell a few days ago but denies hitting her head of LOC.  States her leg gave out.  She is not on any blood thinner medication.  She denies any head having any loss of consciousness.  She admits to tobacco use.  She denies any recent cocaine use, last use was 6 months ago. ?Physical Exam  ?BP (!) 148/85   Pulse 64   Temp (!) 97.5 ?F (36.4 ?C) (Oral)   Resp 18   SpO2 100%  ? ?Physical Exam ?Vitals and nursing note reviewed.  ?Constitutional:   ?   General: She is not in acute distress. ?   Appearance: She is well-developed.  ?HENT:  ?   Head: Normocephalic and atraumatic.  ?   Right Ear: External ear normal.  ?   Left Ear: External ear normal.  ?Eyes:  ?   General: No scleral icterus.    ?   Right eye: No discharge.     ?   Left eye: No discharge.  ?   Conjunctiva/sclera: Conjunctivae normal.  ?Neck:  ?   Trachea: No tracheal deviation.  ?Cardiovascular:  ?   Rate and Rhythm: Normal rate.  ?Pulmonary:  ?   Effort: Pulmonary effort is normal. No respiratory distress.  ?   Breath sounds: No stridor.  ?Abdominal:  ?   General: There is no distension.  ?Musculoskeletal:     ?   General: No swelling or deformity.  ?   Cervical back: Neck supple.  ?Skin: ?   General: Skin is warm and dry.  ?   Findings: No rash.  ?Neurological:  ?   Mental Status: She is alert.  ? ?Procedures   ?Procedures ? ?ED Course / MDM  ? ?Clinical Course as of 04/02/21 1841  ?Thu Apr 02, 2021  ?[place] [EW]  ?  ?Clinical Course User Index ?[EW] Daleen Bo, MD  ? ?Medical Decision Making ?Amount and/or Complexity of Data Reviewed ?Labs: ordered. ?Radiology: ordered. Decision-making details documented in ED Course. ? ?Risk ?Prescription drug management. ?Decision regarding hospitalization. ? ?Patient is a 66 year old female whose care was transferred to me at shift change from [facility].  Please see his note for additional information.  In summary, patient presents today with left-sided body aches as well as weakness.  Last known normal was about 2 days ago.  She has a history of rheumatoid arthritis and previous PA-C noted reproducible tenderness around her left arm and left chest as well as left leg without focal point tenderness.  Also noticed left-sided weakness when compared to the right but no facial droop or changes in mentation.  Lab work resulted prior to shift change and appears generally reassuring, though her UDS did show opiates as well as cocaine.  UA was nitrate positive and antibiotics were ordered.  MRI of the brain and cervical spine were obtained showing multilevel cervical disc changes.  Mild spinal stenosis at C2-3 with moderate foraminal stenosis.  Severe spinal stenosis at C3-4 which has progressed and now there is cord signal hyperintensity which could be due to edema due to acute compressive myelopathy with severe foraminal encroachment bilaterally.  Mild spinal stenosis C4-5 with moderate foraminal encroachment bilaterally.  Small area of cord hyperintensity posterior new since the prior study.  ACDF with solid fusion C5-6 and C6-7 with new area of cord hyperintensity centrally at the C7 level.  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  They recommended follow-up CT scan of the soft tissues of the  neck.  Due to patient's listed allergy to contrast including anaphylaxis radiology does not recommend doing this imaging with contrast.  Previous PA-C also discussed the patient's case with Dr. Christella Noa with neurosurgery.  Reports that she has a history of noncompliance and previously missed multiple appointments.  They feel that her symptoms are likely chronic and would not need emergent surgical intervention though they did agree to be involved in the patient's care if she is admitted. ? ?CT of the soft tissues of the neck without contrast shows: ? ?  ?IMPRESSION:  ?This study done without contrast is of limited utility. However,  ?comparing with the MRI studies done earlier today and with CT and  ?MRI studies dating back as far as 05/12/2015, we can conclude that  ?there are multiple well circumscribed parotid masses, slowly  ?enlarging since 2017. This description is typical of or thin tumors.  ?The behavior would not be consistent with aggressive lesions.  ? ?On reassessment patient notes moderate improvement in her pain.  Left hand with multiple fingers still and forced contracture.  Feel that she would benefit from admission for pain management as well as PT/OT. She has been evaluated by Dr. Christella Noa with neurosurgery. Please see his note for further information. We will discuss with the medicine team. ? ? ? ? ? ? ?  ?Rayna Sexton, PA-C ?04/02/21 1842 ? ?  ?Daleen Bo, MD ?04/02/21 2243 ? ?
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
## 2021-04-02 NOTE — ED Notes (Signed)
Patient transported to MRI 

## 2021-04-02 NOTE — ED Provider Notes (Signed)
?[facility] DEPT ?Provider Note ? ? ?CSN: [id number] ?Arrival date & time: 04/02/21  0930 ? ?  ? ?History ? ?Chief Complaint  ?Patient presents with  ? Fall  ? ? ?[name] is a 66 y.o. female. ? ?The history is provided by the patient and medical records. No language interpreter was used.  ?Fall ? ? ?66 year old female with significant history of hypertension, rheumatoid arthritis, COPD, GERD, CKD, polysubstance abuse, brought here via EMS from home with complaints of left-sided pain.  Patient report for the past 2 days she has had both pain and weakness involving her left arm and left leg as well as her chest.  Pain is sharp achy throbbing persistent.  States she is having difficulty picking up her left arm and states "I worried I may have a stroke".  Patient denies having any stroke in the past.  She did report that she fell a few days ago but denies hitting her head of LOC.  States her leg gave out.  She is not on any blood thinner medication.  She denies any head having any loss of consciousness.  She admits to tobacco use.  She denies any recent cocaine use, last use was 6 months ago. ? ?Home Medications ?Prior to Admission medications   ?Medication Sig Start Date End Date Taking? Authorizing Provider  ?albuterol (VENTOLIN HFA) 108 (90 Base) MCG/ACT inhaler Inhale 1-2 puffs into the lungs every 6 (six) hours as needed for wheezing or shortness of breath. 11/24/20   Kathie Dike, MD  ?amitriptyline (ELAVIL) 50 MG tablet Take 50 mg by mouth at bedtime.    [provider]  ?amLODipine (NORVASC) 10 MG tablet Take 10 mg by mouth every morning. 03/12/19   [provider]  ?atorvastatin (LIPITOR) 40 MG tablet Take 1 tablet (40 mg total) by mouth daily at 6 PM. ?Patient taking differently: Take 40 mg by mouth at bedtime. 08/22/14   Rai, Vernelle Emerald, MD  ?cephALEXin (KEFLEX) 500 MG capsule Take 1 capsule (500 mg total) by mouth 3 (three) times daily. 03/06/21   Raspet,  Derry Skill, PA-C  ?diclofenac Sodium (VOLTAREN) 1 % GEL Apply 2 g topically 4 (four) times daily as needed (joint pain). 03/12/19   [provider]  ?dicyclomine (BENTYL) 20 MG tablet Take 1 tablet (20 mg total) by mouth 2 (two) times daily. 12/10/20   Dorie Rank, MD  ?etodolac (LODINE) 300 MG capsule Take 1 capsule (300 mg total) by mouth every 8 (eight) hours. 12/10/20   Dorie Rank, MD  ?fluticasone Asencion Islam) 50 MCG/ACT nasal spray Place 2 sprays into both nostrils daily. 03/12/19   [provider]  ?gabapentin (NEURONTIN) 100 MG capsule Take 2 capsules (200 mg total) by mouth 2 (two) times daily. 11/24/20   Kathie Dike, MD  ?linaclotide Rolan Lipa) 145 MCG CAPS capsule Take 145 mcg by mouth daily before breakfast.    [provider]  ?oxyCODONE (ROXICODONE) 15 MG immediate release tablet Take 1 tablet (15 mg total) by mouth every 6 (six) hours as needed for pain. 11/24/20   Kathie Dike, MD  ?pantoprazole (PROTONIX) 40 MG tablet Take 1 tablet (40 mg total) by mouth 2 (two) times daily before a meal. ?Patient not taking: Reported on 11/26/2020 06/29/18   Dessa Phi, DO  ?polyethylene glycol (MIRALAX / GLYCOLAX) 17 g packet Take 17 g by mouth daily. 11/25/20   Kathie Dike, MD  ?umeclidinium-vilanterol (ANORO ELLIPTA) 62.5-25 MCG/ACT AEPB Inhale 1 puff into the lungs daily. 11/25/20  Kathie Dike, MD  ?   ? ?Allergies    ?Iohexol, Iodine-131, Levofloxacin, Zofran [ondansetron hcl], Heparin, and Morphine and related   ? ?Review of Systems   ?Review of Systems  ?All other systems reviewed and are negative. ? ?Physical Exam ?Updated Vital Signs ?BP 138/83 (BP Location: Left Arm)   Pulse 69   Temp (!) 97.5 ?F (36.4 ?C) (Oral)   Resp 14   SpO2 95%  ?Physical Exam ?Vitals and nursing note reviewed.  ?Constitutional:   ?   General: She is not in acute distress. ?   Appearance: She is well-developed.  ?   Comments: Elderly female who is sleeping but easily arousable  ?HENT:  ?   Head:  Atraumatic.  ?Eyes:  ?   Conjunctiva/sclera: Conjunctivae normal.  ?Cardiovascular:  ?   Rate and Rhythm: Normal rate and regular rhythm.  ?   Pulses: Normal pulses.  ?   Heart sounds: Normal heart sounds.  ?Pulmonary:  ?   Effort: Pulmonary effort is normal.  ?Abdominal:  ?   Palpations: Abdomen is soft.  ?   Tenderness: There is no abdominal tenderness.  ?Musculoskeletal:     ?   General: Tenderness (Tenderness noted throughout left arm, left side of chest, left leg without focal point tenderness.) present.  ?   Cervical back: Neck supple.  ?Skin: ?   Findings: No rash.  ?Neurological:  ?   Mental Status: She is alert.  ?   Comments:  ? ?Neurologic exam: ? ?Speech clear, pupils equal round reactive to light, extraocular movements intact  ?Normal peripheral visual fields ?Cranial nerves III through XII normal including no facial droop ?5 out of 5 to right upper and lower extremities, 4 out of 5 to left upper and lower extremities with poor effort.  Decreased grip strength to the left hand. ?Sensation normal to light touch  ?L pronator drift ?Gait not tested ?  ?Psychiatric:     ?   Mood and Affect: Mood normal.  ? ? ?ED Results / Procedures / Treatments   ?Labs ?(all labs ordered are listed, but only abnormal results are displayed) ?Labs Reviewed  ?COMPREHENSIVE METABOLIC PANEL - Abnormal; Notable for the following components:  ?    Result Value  ? Sodium 134 (*)   ? Glucose, Bld 103 (*)   ? Calcium 8.8 (*)   ? Anion gap 4 (*)   ? All other components within normal limits  ?RAPID URINE DRUG SCREEN, HOSP PERFORMED - Abnormal; Notable for the following components:  ? Opiates POSITIVE (*)   ? Cocaine POSITIVE (*)   ? All other components within normal limits  ?URINALYSIS, ROUTINE W REFLEX MICROSCOPIC - Abnormal; Notable for the following components:  ? Nitrite POSITIVE (*)   ? Leukocytes,Ua TRACE (*)   ? Bacteria, UA FEW (*)   ? All other components within normal limits  ?I-STAT CHEM 8, ED - Abnormal; Notable for the  following components:  ? Calcium, Ion 1.14 (*)   ? All other components within normal limits  ?RESP PANEL BY RT-PCR (FLU A&B, COVID) [id number]  ?URINE CULTURE  ?ETHANOL  ?PROTIME-INR  ?APTT  ?CBC  ?DIFFERENTIAL  ? ? ?EKG ?None ?ED ECG REPORT ? ? Date: 04/02/2021 ? Rate: 69 ? Rhythm: normal sinus rhythm ? QRS Axis: normal ? Intervals: normal ? ST/T Wave abnormalities: normal ? Conduction Disutrbances:none ? Narrative Interpretation: LVH ? Old EKG Reviewed: unchanged ? ?I have personally reviewed the EKG tracing and agree with  the computerized printout as noted. ? ? ?Radiology ?MR BRAIN WO CONTRAST ? ?Result Date: 04/02/2021 ?CLINICAL DATA:  Neuro deficit, acute, stroke suspected EXAM: MRI HEAD WITHOUT CONTRAST TECHNIQUE: Multiplanar, multiecho pulse sequences of the brain and surrounding structures were obtained without intravenous contrast. COMPARISON:  CT head June 12, 2020. FINDINGS: Brain: No acute infarction, hemorrhage, hydrocephalus, extra-axial collection or mass lesion. Mild T2/FLAIR hyperintensities in the white matter, nonspecific but compatible chronic microvascular ischemic disease. Vascular: Major arterial flow voids are maintained at the skull base. Skull and upper cervical spine: Normal marrow signal. Partially imaged ACDF. Sinuses/Orbits: Clear sinuses.  Unremarkable orbits. Other: No mastoid effusions. IMPRESSION: 1. No evidence of acute intracranial abnormality. 2. Mild chronic microvascular disease. Electronically Signed   By: Margaretha Sheffield M.D.   On: 04/02/2021 14:15  ? ?MR Cervical Spine Wo Contrast ? ?Result Date: 04/02/2021 ?CLINICAL DATA:  Cervical radiculopathy. History of cervical fusion. Recent fall. EXAM: MRI CERVICAL SPINE WITHOUT CONTRAST TECHNIQUE: Multiplanar, multisequence MR imaging of the cervical spine was performed. No intravenous contrast was administered. COMPARISON:  Cervical MRI 10/26/2016.  Cervical spine CT 06/12/2020 FINDINGS: Alignment: Mild retrolisthesis C2-3 and C3-4.  Vertebrae: ACDF C5-6 and C6-7.  Negative for fracture or mass Cord: Ill-defined cord hyperintensity throughout the cord at C3-4 with severe spinal stenosis. Cord signal abnormality was not present in 2018 and

## 2021-04-02 NOTE — ED Notes (Signed)
Pt given sandwich, cheese and apple sauce.   ?

## 2021-04-02 NOTE — ED Triage Notes (Signed)
Pt BIB EMS from home. Pt endorses generalized pain specifically in bilateral knee pain and right arm. Pt reports fall a few days ago. Denies hitting head and LOC. Does not take blood thinners. Hx of arthritis.  ?

## 2021-04-03 DIAGNOSIS — Z885 Allergy status to narcotic agent status: Secondary | ICD-10-CM | POA: Diagnosis not present

## 2021-04-03 DIAGNOSIS — J439 Emphysema, unspecified: Secondary | ICD-10-CM | POA: Diagnosis present

## 2021-04-03 DIAGNOSIS — R52 Pain, unspecified: Secondary | ICD-10-CM | POA: Diagnosis present

## 2021-04-03 DIAGNOSIS — N390 Urinary tract infection, site not specified: Secondary | ICD-10-CM | POA: Diagnosis present

## 2021-04-03 DIAGNOSIS — M24569 Contracture, unspecified knee: Secondary | ICD-10-CM | POA: Diagnosis not present

## 2021-04-03 DIAGNOSIS — M069 Rheumatoid arthritis, unspecified: Secondary | ICD-10-CM | POA: Diagnosis present

## 2021-04-03 DIAGNOSIS — F1410 Cocaine abuse, uncomplicated: Secondary | ICD-10-CM | POA: Diagnosis not present

## 2021-04-03 DIAGNOSIS — F17210 Nicotine dependence, cigarettes, uncomplicated: Secondary | ICD-10-CM | POA: Diagnosis present

## 2021-04-03 DIAGNOSIS — Z888 Allergy status to other drugs, medicaments and biological substances status: Secondary | ICD-10-CM | POA: Diagnosis not present

## 2021-04-03 DIAGNOSIS — E871 Hypo-osmolality and hyponatremia: Secondary | ICD-10-CM | POA: Diagnosis present

## 2021-04-03 DIAGNOSIS — M4802 Spinal stenosis, cervical region: Secondary | ICD-10-CM | POA: Diagnosis present

## 2021-04-03 DIAGNOSIS — M720 Palmar fascial fibromatosis [Dupuytren]: Secondary | ICD-10-CM | POA: Diagnosis present

## 2021-04-03 DIAGNOSIS — N183 Chronic kidney disease, stage 3 unspecified: Secondary | ICD-10-CM | POA: Diagnosis present

## 2021-04-03 DIAGNOSIS — I129 Hypertensive chronic kidney disease with stage 1 through stage 4 chronic kidney disease, or unspecified chronic kidney disease: Secondary | ICD-10-CM | POA: Diagnosis present

## 2021-04-03 DIAGNOSIS — Z20822 Contact with and (suspected) exposure to covid-19: Secondary | ICD-10-CM | POA: Diagnosis present

## 2021-04-03 DIAGNOSIS — R221 Localized swelling, mass and lump, neck: Secondary | ICD-10-CM | POA: Diagnosis present

## 2021-04-03 DIAGNOSIS — E440 Moderate protein-calorie malnutrition: Secondary | ICD-10-CM | POA: Diagnosis present

## 2021-04-03 DIAGNOSIS — Z91048 Other nonmedicinal substance allergy status: Secondary | ICD-10-CM | POA: Diagnosis not present

## 2021-04-03 DIAGNOSIS — E538 Deficiency of other specified B group vitamins: Secondary | ICD-10-CM | POA: Diagnosis present

## 2021-04-03 DIAGNOSIS — K219 Gastro-esophageal reflux disease without esophagitis: Secondary | ICD-10-CM | POA: Diagnosis present

## 2021-04-03 DIAGNOSIS — Z881 Allergy status to other antibiotic agents status: Secondary | ICD-10-CM | POA: Diagnosis not present

## 2021-04-03 DIAGNOSIS — Z981 Arthrodesis status: Secondary | ICD-10-CM | POA: Diagnosis not present

## 2021-04-03 DIAGNOSIS — B9620 Unspecified Escherichia coli [E. coli] as the cause of diseases classified elsewhere: Secondary | ICD-10-CM | POA: Diagnosis present

## 2021-04-03 DIAGNOSIS — Z79899 Other long term (current) drug therapy: Secondary | ICD-10-CM | POA: Diagnosis not present

## 2021-04-03 DIAGNOSIS — F32A Depression, unspecified: Secondary | ICD-10-CM | POA: Diagnosis present

## 2021-04-03 DIAGNOSIS — I10 Essential (primary) hypertension: Secondary | ICD-10-CM | POA: Diagnosis not present

## 2021-04-03 DIAGNOSIS — Z91199 Patient's noncompliance with other medical treatment and regimen due to unspecified reason: Secondary | ICD-10-CM | POA: Diagnosis not present

## 2021-04-03 DIAGNOSIS — G629 Polyneuropathy, unspecified: Secondary | ICD-10-CM | POA: Diagnosis present

## 2021-04-03 LAB — BASIC METABOLIC PANEL
Anion gap: 7 (ref 5–15)
BUN: 23 mg/dL (ref 8–23)
CO2: 20 mmol/L — ABNORMAL LOW (ref 22–32)
Calcium: 9 mg/dL (ref 8.9–10.3)
Chloride: 105 mmol/L (ref 98–111)
Creatinine, Ser: 1.06 mg/dL — ABNORMAL HIGH (ref 0.44–1.00)
GFR, Estimated: 58 mL/min — ABNORMAL LOW (ref >60)
Glucose, Bld: 119 mg/dL — ABNORMAL HIGH (ref 70–99)
Potassium: 5.1 mmol/L (ref 3.5–5.1)
Sodium: 132 mmol/L — ABNORMAL LOW (ref 135–145)

## 2021-04-03 LAB — HEMOGLOBIN A1C
Hgb A1c MFr Bld: 5.4 % (ref 4.8–5.6)
Mean Plasma Glucose: 108.28 mg/dL

## 2021-04-03 LAB — FOLATE: Folate: 5.2 ng/mL — ABNORMAL LOW (ref >5.9)

## 2021-04-03 LAB — VITAMIN B12: Vitamin B-12: 229 pg/mL (ref 180–914)

## 2021-04-03 MED ORDER — AMITRIPTYLINE HCL 25 MG PO TABS
50.0000 mg | ORAL_TABLET | Freq: Every day | ORAL | Status: DC
  Administered 2021-04-03 – 2021-04-07 (×5): 50 mg via ORAL
  Filled 2021-04-03 (×5): qty 2

## 2021-04-03 MED ORDER — AMLODIPINE BESYLATE 10 MG PO TABS
10.0000 mg | ORAL_TABLET | Freq: Every morning | ORAL | Status: DC
Start: 2021-04-03 — End: 2021-04-08
  Administered 2021-04-03 – 2021-04-08 (×6): 10 mg via ORAL
  Filled 2021-04-03 (×6): qty 1

## 2021-04-03 MED ORDER — LINACLOTIDE 145 MCG PO CAPS
145.0000 ug | ORAL_CAPSULE | Freq: Every day | ORAL | Status: DC
  Administered 2021-04-03 – 2021-04-08 (×4): 145 ug via ORAL
  Filled 2021-04-03 (×7): qty 1

## 2021-04-03 MED ORDER — DULOXETINE HCL 30 MG PO CPEP
30.0000 mg | ORAL_CAPSULE | Freq: Every day | ORAL | Status: DC
  Administered 2021-04-03 – 2021-04-08 (×6): 30 mg via ORAL
  Filled 2021-04-03 (×6): qty 1

## 2021-04-03 MED ORDER — UMECLIDINIUM-VILANTEROL 62.5-25 MCG/ACT IN AEPB
1.0000 | INHALATION_SPRAY | Freq: Every day | RESPIRATORY_TRACT | Status: DC
  Administered 2021-04-04 – 2021-04-08 (×5): 1 via RESPIRATORY_TRACT
  Filled 2021-04-03: qty 14

## 2021-04-03 MED ORDER — FOLIC ACID 1 MG PO TABS
1.0000 mg | ORAL_TABLET | Freq: Every day | ORAL | Status: DC
  Administered 2021-04-03 – 2021-04-08 (×6): 1 mg via ORAL
  Filled 2021-04-03 (×6): qty 1

## 2021-04-03 MED ORDER — ATORVASTATIN CALCIUM 40 MG PO TABS
40.0000 mg | ORAL_TABLET | Freq: Every day | ORAL | Status: DC
  Administered 2021-04-03 – 2021-04-07 (×5): 40 mg via ORAL
  Filled 2021-04-03 (×5): qty 1

## 2021-04-03 MED ORDER — PANTOPRAZOLE SODIUM 40 MG PO TBEC
40.0000 mg | DELAYED_RELEASE_TABLET | Freq: Every day | ORAL | Status: DC
  Administered 2021-04-03 – 2021-04-08 (×6): 40 mg via ORAL
  Filled 2021-04-03 (×6): qty 1

## 2021-04-03 MED ORDER — GABAPENTIN 400 MG PO CAPS
400.0000 mg | ORAL_CAPSULE | Freq: Every day | ORAL | Status: DC
  Administered 2021-04-03 – 2021-04-07 (×5): 400 mg via ORAL
  Filled 2021-04-03 (×5): qty 1

## 2021-04-03 MED ORDER — CYANOCOBALAMIN 1000 MCG/ML IJ SOLN
1000.0000 ug | Freq: Once | INTRAMUSCULAR | Status: AC
  Administered 2021-04-03: 1000 ug via INTRAMUSCULAR
  Filled 2021-04-03: qty 1

## 2021-04-03 MED ORDER — BISACODYL 10 MG RE SUPP
10.0000 mg | Freq: Every day | RECTAL | Status: DC | PRN
Start: 2021-04-03 — End: 2021-04-08

## 2021-04-03 MED ORDER — POLYETHYLENE GLYCOL 3350 17 G PO PACK: 17.0000 g | PACK | Freq: Every day | ORAL | Status: DC | PRN

## 2021-04-03 MED ORDER — SENNOSIDES-DOCUSATE SODIUM 8.6-50 MG PO TABS
1.0000 | ORAL_TABLET | Freq: Two times a day (BID) | ORAL | Status: DC
  Administered 2021-04-03 – 2021-04-08 (×7): 1 via ORAL
  Filled 2021-04-03 (×11): qty 1

## 2021-04-03 NOTE — Plan of Care (Signed)
Pt aox4, anxious but cooperative.  ?Pain medications per orders.  ?PT working with patient.  ?Neurosurgery following.  ? ?Problem: Education: ?Goal: Knowledge of General Education information will improve ?Description: Including pain rating scale, medication(s)/side effects and non-pharmacologic comfort measures ?Outcome: Progressing ?  ?Problem: Health Behavior/Discharge Planning: ?Goal: Ability to manage health-related needs will improve ?Outcome: Progressing ?  ?Problem: Clinical Measurements: ?Goal: Ability to maintain clinical measurements within normal limits will improve ?Outcome: Progressing ?Goal: Will remain free from infection ?Outcome: Progressing ?Goal: Diagnostic test results will improve ?Outcome: Progressing ?Goal: Respiratory complications will improve ?Outcome: Progressing ?Goal: Cardiovascular complication will be avoided ?Outcome: Progressing ?  ?Problem: Activity: ?Goal: Risk for activity intolerance will decrease ?Outcome: Progressing ?  ?Problem: Nutrition: ?Goal: Adequate nutrition will be maintained ?Outcome: Progressing ?  ?Problem: Coping: ?Goal: Level of anxiety will decrease ?Outcome: Progressing ?  ?Problem: Elimination: ?Goal: Will not experience complications related to bowel motility ?Outcome: Progressing ?Goal: Will not experience complications related to urinary retention ?Outcome: Progressing ?  ?Problem: Pain Managment: ?Goal: General experience of comfort will improve ?Outcome: Progressing ?  ?Problem: Safety: ?Goal: Ability to remain free from injury will improve ?Outcome: Progressing ?  ?Problem: Skin Integrity: ?Goal: Risk for impaired skin integrity will decrease ?Outcome: Progressing ?  ?

## 2021-04-03 NOTE — Assessment & Plan Note (Addendum)
Continue home amitriptyline, gabapentin and Cymbalta ?

## 2021-04-03 NOTE — Assessment & Plan Note (Addendum)
Third digit. Outpatient follow-up. ?

## 2021-04-03 NOTE — Assessment & Plan Note (Addendum)
Hx of ACDF C5/6,6/7 in 04/27/21 for cervical stenosis in past. Worsening cervical stenosis seen on MRI imaging. Neurosurgery consulted and recommended no surgical management at this time secondary to comorbidities and instead recommend outpatient follow-up with outpatient neurosurgeon, Dr. Ronnald Ramp. Patient with resultant L>R weakness in addition to extremity numbness. PT recommending SNF. Continue outpatient pain regimen. ?

## 2021-04-03 NOTE — Assessment & Plan Note (Signed)
Continue home bronchodilator ?

## 2021-04-03 NOTE — Evaluation (Signed)
Physical Therapy Evaluation ?Patient Details ?Name: [name] ?MRN: [id number] ?DOB: August 08, 1955 ?Today's Date: 04/03/2021 ? ?History of Present Illness ? Pt admitted from home s/p falls and with c/o bil knee pain, R arm pain and L hand pain.  Pt dx with UTI and worsening cervical spinal stenosis.  Pt with hx of CKD, COPD, substance abuse (cocaine), RA, anterior cervical fusion and peripheral neuropathy  ?Clinical Impression ? Pt admitted as above and presenting with functional mobility limitations 2* generalized weakness, bilat LE muscle shortening limiting dorsiflexion, knee extension and hip extension, balance deficits, decreased endurance and ongoing c/o pain.  This date, pt states "just not feeling well" but with encouragement pt agreeable to sit EOB and convinced to stand and take a few steps while bed being straightened.  Pt reporting min assist at home and at increased risk of falls and would benefit from follow up rehab at SNF level to maximize IND and safety.  Pt would also benefit from aggressive LE stretching program but question pt's level of tolerance for pain associated with same.  ?   ? ?Recommendations for follow up therapy are one component of a multi-disciplinary discharge planning process, led by the attending physician.  Recommendations may be updated based on patient status, additional functional criteria and insurance authorization. ? ?Follow Up Recommendations Skilled nursing-short term rehab (<3 hours/day) ? ?  ?Assistance Recommended at Discharge Frequent or constant Supervision/Assistance  ?Patient can return home with the following ? A little help with walking and/or transfers;Assistance with cooking/housework;Assist for transportation;Help with stairs or ramp for entrance ? ?  ?Equipment Recommendations None recommended by PT  ?Recommendations for Other Services ? OT consult  ?  ?Functional Status Assessment Patient has had a recent decline in their functional status and demonstrates  the ability to make significant improvements in function in a reasonable and predictable amount of time.  ? ?  ?Precautions / Restrictions Precautions ?Precautions: Fall ?Restrictions ?Weight Bearing Restrictions: No  ? ?  ? ?Mobility ? Bed Mobility ?Overal bed mobility: Modified Independent ?  ?  ?  ?  ?  ?  ?General bed mobility comments: Pt supine<>sit unassisted ?  ? ?Transfers ?Overall transfer level: Needs assistance ?Equipment used: Rolling walker (2 wheels) ?Transfers: Sit to/from Stand ?Sit to Stand: Min assist ?  ?  ?  ?  ?  ?General transfer comment: cues ?  ? ?Ambulation/Gait ?Ambulation/Gait assistance: Min assist ?Gait Distance (Feet): 8 Feet ?Assistive device: Rolling walker (2 wheels) ?Gait Pattern/deviations: Step-to pattern, Decreased step length - right, Decreased step length - left, Shuffle, Trunk flexed ?Gait velocity: decr ?  ?  ?General Gait Details: assist for balance support with pt in flexed posture and bobbing up and down inside RW ? ?Stairs ?  ?  ?  ?  ?  ? ?Wheelchair Mobility ?  ? ?Modified Rankin (Stroke Patients Only) ?  ? ?  ? ?Balance Overall balance assessment: Needs assistance ?Sitting-balance support: No upper extremity supported, Feet supported ?Sitting balance-Leahy Scale: Good ?  ?  ?Standing balance support: Bilateral upper extremity supported ?Standing balance-Leahy Scale: Poor ?  ?  ?  ?  ?  ?  ?  ?  ?  ?  ?  ?  ?   ? ? ? ?Pertinent Vitals/Pain Pain Assessment ?Pain Assessment: Faces ?Faces Pain Scale: Hurts even more ?Pain Location: knees and hips with attempts to extend joints ?Pain Descriptors / Indicators: Grimacing, Guarding, Spasm ?Pain Intervention(s): Limited activity within patient's tolerance, Monitored during session,  Premedicated before session  ? ? ?Home Living Family/patient expects to be discharged to:: Private residence ?Living Arrangements: Alone ?Available Help at Discharge: Family;Friend(s);Available PRN/intermittently (States lives alone and dtr can get  her groceries) ?Type of Home: Group Home ?Home Access: Stairs to enter ?Entrance Stairs-Rails: Can reach both ?Entrance Stairs-Number of Steps: 2 ?Alternate Level Stairs-Number of Steps: Flight ?Home Layout: Two level ?Home Equipment: Rollator (4 wheels) ?Additional Comments: Pt vague on living situation but states "lives alone" and dtr can get her groceries. Information taken from prior hospital admit 11/22  ?  ?Prior Function Prior Level of Function : Independent/Modified Independent ?  ?  ?  ?  ?  ?  ?Mobility Comments: Pt states uses rollator and "walks a lot" ?ADLs Comments: independent with bathing, dressing ?  ? ? ?Hand Dominance  ? Dominant Hand: Right ? ?  ?Extremity/Trunk Assessment  ? Upper Extremity Assessment ?Upper Extremity Assessment: Generalized weakness;RUE deficits/detail;LUE deficits/detail ?RUE Deficits / Details: ROM WFl with strength 4/5 ?LUE Deficits / Details: ROM WFL but with index and middle finger noted to be taped together.  Strength 3+/5 ?  ? ?Lower Extremity Assessment ?Lower Extremity Assessment: RLE deficits/detail;LLE deficits/detail ?RLE Deficits / Details: Limited all joints with DF to neutral, -10 knee ext and - 20 hip extension; tolerated MMT to 3+ only ?RLE: Unable to fully assess due to pain ?RLE Sensation: history of peripheral neuropathy ?LLE Deficits / Details: Limited all joints with DF to neutral, -12 knee ext and ~-20; tolerated MMT to 3+/5 only ?  ? ?Cervical / Trunk Assessment ?Cervical / Trunk Assessment: Kyphotic  ?Communication  ? Communication: No difficulties  ?Cognition Arousal/Alertness: Awake/alert ?Behavior During Therapy: [facility] for tasks assessed/performed, Flat affect ?Overall Cognitive Status: Within Functional Limits for tasks assessed ?  ?  ?  ?  ?  ?  ?  ?  ?  ?  ?  ?  ?  ?  ?  ?  ?  ?  ?  ? ?  ?General Comments   ? ?  ?Exercises    ? ?Assessment/Plan  ?  ?PT Assessment Patient needs continued PT services  ?PT Problem List Decreased strength;Decreased  range of motion;Decreased activity tolerance;Decreased balance;Decreased mobility;Decreased knowledge of use of DME;Pain ? ?   ?  ?PT Treatment Interventions DME instruction;Gait training;Stair training;Functional mobility training;Therapeutic exercise;Balance training;Therapeutic activities;Patient/family education;Neuromuscular re-education   ? ?PT Goals (Current goals can be found in the Care Plan section)  ?Acute Rehab PT Goals ?Patient Stated Goal: Less pain; regain IND ?PT Goal Formulation: With patient ?Time For Goal Achievement: 04/16/21 ?Potential to Achieve Goals: Fair ? ?  ?Frequency Min 3X/week ?  ? ? ?Co-evaluation   ?  ?  ?  ?  ? ? ?  ?AM-PAC PT "6 Clicks" Mobility  ?Outcome Measure Help needed turning from your back to your side while in a flat bed without using bedrails?: None ?Help needed moving from lying on your back to sitting on the side of a flat bed without using bedrails?: None ?Help needed moving to and from a bed to a chair (including a wheelchair)?: A Little ?Help needed standing up from a chair using your arms (e.g., wheelchair or bedside chair)?: A Little ?Help needed to walk in hospital room?: A Lot ?Help needed climbing 3-5 steps with a railing? : Total ?6 Click Score: 17 ? ?  ?End of Session Equipment Utilized During Treatment: Gait belt ?Activity Tolerance: Patient limited by fatigue;Patient limited by pain ?Patient left: in  bed;with call bell/phone within reach;with bed alarm set ?Nurse Communication: Mobility status ?PT Visit Diagnosis: Unsteadiness on feet (R26.81);Muscle weakness (generalized) (M62.81);History of falling (Z91.81);Difficulty in walking, not elsewhere classified (R26.2);Pain ?Pain - Right/Left:  (bilat knees and hip flexors) ?Pain - part of body: Hip;Knee (back) ?  ? ?Time: 1210-1228 ?PT Time Calculation (min) (ACUTE ONLY): 18 min ? ? ?Charges:   PT Evaluation ?$PT Eval Low Complexity: 1 Low ?  ?  ?   ? ? ?Debe Coder PT ?Acute Rehabilitation Services ?Pager  [phone] ?Office [phone] ? ? ?Arelie Kuzel ?04/03/2021, 1:07 PM ? ?

## 2021-04-03 NOTE — Progress Notes (Signed)
?PROGRESS NOTE ? ? ? ?[name]  CZY:[id number] DOB: 1955/03/24 DOA: 04/02/2021 ?PCP: System, Provider Not In  ? ?Brief Narrative:  ?66 y.o. female with medical history significant of COPD, hypertension, CKD stage III, cervical stenosis presented with fall with worsening bilateral knee pain and left hand pain.  On presentation, MRI of brain was negative for acute stroke.  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  Neurosurgery evaluated the patient: Recommended that patient is not a good operative candidate given her history of noncompliance and cocaine use and recommended outpatient follow-up with Dr. Ronnald Ramp at discharge.  No c-collar recommended by neurosurgery. ? ?Assessment & Plan: ?  ?Spinal stenosis in cervical region ?-History of ACDF for cervical stenosis.  Follows up with Dr. Ronnald Ramp and has missed multiple appointments in the office ?-Neurosurgery evaluated the patient: Recommended that patient is not a good operative candidate given her history of noncompliance and cocaine use and recommended outpatient follow-up with Dr. Ronnald Ramp at discharge.  No c-collar recommended by neurosurgery. ?-PT eval ?-Continue pain management ? ?Contracture of joint, lower leg ?-Contracture of bilateral lower extremity.  Patient unable to fully extend both knees.  Suspect due to prolonged hospitalization in November and mostly bedbound and secondary lifestyle ?-PT eval.  Possibly will need intensive PT ? ?Contracture of left hand ?-Will need outpatient follow-up ? ?UTI: Present on admission ?-Follow cultures.  Continue Rocephin ? ?Peripheral neuropathy ?-Continue home amitriptyline, gabapentin and Cymbalta ? ?Folate  deficiency ?-Supplement orally ? ?Possible vitamin B12 deficiency ?-Vitamin B12 at the lower limit of normal.  Start supplementation ? ?Cocaine abuse ?-UDS positive for cocaine.  Counseled regarding abstinence.  TOC consult ? ?COPD ?-Continue inhaled regimen.  Currently stable ? ?Essential hypertension ?-Continue home amlodipine.  Monitor blood pressure ? ?Hyponatremia ?-Mild.  Encourage oral intake. ? ?DVT prophylaxis: Start Lovenox ?Code Status: Full ?Family Communication: None at bedside ?Disposition Plan: ?Status is: Observation ?The patient will require care spanning > 2 midnights and should be moved to inpatient because: Of severe pain.  PT eval pending.  On IV antibiotics ? ? ? ?Consultants: Neurosurgery ? ?Procedures: None ? ?Antimicrobials: Rocephin from 04/02/2021 ? ? ?Subjective: ?Patient seen and examined at bedside.  Still complains of pain all over.  No overnight fever or vomiting reported. ? ?Objective: ?Vitals:  ? 04/02/21 1900 04/02/21 2006 04/03/21 0011 04/03/21 0432  ?BP: (!) 145/103 (!) 151/85 127/84 (!) 147/89  ?Pulse: 97 (!) 106 72 72  ?Resp:  '16 16 16  '$ ?Temp:  97.9 ?F (36.6 ?C) 98.6 ?F (37 ?C) 98.4 ?F (36.9 ?C)  ?TempSrc:  Oral Oral Oral  ?SpO2: 100% 94% 94% 93%  ? ? ?Intake/Output Summary (Last 24 hours) at 04/03/2021 1135 ?Last data filed at 04/03/2021 0444 ?Gross per 24 hour  ?Intake --  ?Output 300 ml  ?Net -300 ml  ? ?There were no vitals filed for this visit. ? ?Examination: ? ?General exam: Appears calm and comfortable.  Looks chronically ill and deconditioned.  Currently on room air. ?Respiratory system: Bilateral decreased breath sounds at bases ?Cardiovascular system: S1 & S2 heard, intermittently tachycardic ?Gastrointestinal system: Abdomen is nondistended, soft and nontender. Normal bowel sounds heard. ?Extremities: No cyanosis, clubbing; trace lower extremity edema ?Central nervous system:  Alert and oriented.  Slow to respond.  Poor historian.  No focal neurological deficits. Moving  extremities ?Skin: No rashes, lesions or ulcers ?Psychiatry: Anxious looking.  No signs of agitation currently. ?Musculoskeletal: Unable to extend bilateral knees fully.  Contracture of left hand present and unable to do bilateral handgrip ? ? ? ?Data Reviewed: I have personally reviewed following labs and imaging studies ? ?CBC: ?Recent Labs  ?Lab 04/02/21 ?1115 04/02/21 ?1124  ?WBC 5.5  --   ?NEUTROABS 2.1  --   ?HGB 12.6 13.6  ?HCT 38.7 40.0  ?MCV 93.5  --   ?PLT 292  --   ? ?Basic Metabolic Panel: ?Recent Labs  ?Lab 04/02/21 ?1115 04/02/21 ?1124 04/03/21 ?0458  ?NA 134* 138 132*  ?K 4.5 4.2 5.1  ?CL 103 103 105  ?CO2 27  --  20*  ?GLUCOSE 103* 95 119*  ?BUN '16 15 23  '$ ?CREATININE 0.95 0.80 1.06*  ?CALCIUM 8.8*  --  9.0  ? ?GFR: ?CrCl cannot be calculated (Unknown ideal weight.). ?Liver Function Tests: ?Recent Labs  ?Lab 04/02/21 ?1115  ?AST 15  ?ALT 9  ?ALKPHOS 114  ?BILITOT 0.8  ?PROT 7.7  ?ALBUMIN 3.7  ? ?No results for input(s): LIPASE, AMYLASE in the last 168 hours. ?No results for input(s): AMMONIA in the last 168 hours. ?Coagulation Profile: ?Recent Labs  ?Lab 04/02/21 ?1115  ?INR 0.9  ? ?Cardiac Enzymes: ?No results for input(s): CKTOTAL, CKMB, CKMBINDEX, TROPONINI in the last 168 hours. ?BNP (last 3 results) ?No results for input(s): PROBNP in the last 8760 hours. ?HbA1C: ?Recent Labs  ?  04/03/21 ?0458  ?HGBA1C 5.4  ? ?CBG: ?No results for input(s): GLUCAP in the last 168 hours. ?Lipid Profile: ?No results for input(s): CHOL, HDL, LDLCALC, TRIG, CHOLHDL, LDLDIRECT in the last 72 hours. ?Thyroid Function Tests: ?No results for input(s): TSH, T4TOTAL, FREET4, T3FREE, THYROIDAB in the last 72 hours. ?Anemia Panel: ?Recent Labs  ?  04/03/21 ?0458  ?VITAMINB12 229  ?FOLATE 5.2*  ? ?Sepsis Labs: ?No results for input(s): PROCALCITON, LATICACIDVEN in the last 168 hours. ? ?Recent Results (from the past 240 hour(s))  ?Resp Panel by RT-PCR (Flu A&B, Covid) Nasopharyngeal Swab     Status: None  ? Collection Time:  04/02/21 11:14 AM  ? Specimen: Nasopharyngeal Swab; Nasopharyngeal(NP) swabs in vial transport medium  ?Result Value Ref Range Status  ? SARS Coronavirus 2 by RT PCR NEGATIVE NEGATIVE Final  ?  Comment: (NOTE) ?SARS-CoV-2 target nucleic acids are NOT DETECTED. ? ?The SARS-CoV-2 RNA is generally detectable in upper respiratory ?specimens during the acute phase of infection. The lowest ?concentration of SARS-CoV-2 viral copies this assay can detect is ?138 copies/mL. A negative result does not preclude SARS-Cov-2 ?infection and should not be used as the sole basis for treatment or ?other patient management decisions. A negative result may occur with  ?improper specimen collection/handling, submission of specimen other ?than nasopharyngeal swab, presence of viral mutation(s) within the ?areas targeted by this assay, and inadequate number of viral ?copies(<138 copies/mL). A negative result must be combined with ?clinical observations, patient history, and epidemiological ?information. The expected result is Negative. ? ?Fact Sheet for Patients:  ?EntrepreneurPulse.com.au ? ?Fact Sheet for Healthcare Providers:  ?IncredibleEmployment.be ? ?This test is no t yet approved or cleared by the [place] FDA and  ?has been authorized for detection and/or diagnosis of SARS-CoV-2 by ?FDA under an Emergency Use Authorization (EUA). This EUA will remain  ?in effect (meaning this test can be used)  for the duration of the ?COVID-19 declaration under Section 564(b)(1) of the Act, 21 ?U.S.C.section 360bbb-3(b)(1), unless the authorization is terminated  ?or revoked sooner.  ? ? ?  ? Influenza A by PCR NEGATIVE NEGATIVE Final  ? Influenza B by PCR NEGATIVE NEGATIVE Final  ?  Comment: (NOTE) ?The Xpert Xpress SARS-CoV-2/FLU/RSV plus assay is intended as an aid ?in the diagnosis of influenza from Nasopharyngeal swab specimens and ?should not be used as a sole basis for treatment. Nasal washings  and ?aspirates are unacceptable for Xpert Xpress SARS-CoV-2/FLU/RSV ?testing. ? ?Fact Sheet for Patients: ?EntrepreneurPulse.com.au ? ?Fact Sheet for Healthcare Providers: ?CubaZine.tn

## 2021-04-03 NOTE — Assessment & Plan Note (Addendum)
-  Continue home amlodipine 

## 2021-04-03 NOTE — Assessment & Plan Note (Addendum)
Patient completed Ceftriaxone IV x5 days. ?

## 2021-04-03 NOTE — Progress Notes (Signed)
Initial Nutrition Assessment ? ?DOCUMENTATION CODES:  ? ?Non-severe (moderate) malnutrition in context of social or environmental circumstances ? ?INTERVENTION:  ? ?-Magic cup TID with meals, each supplement provides 290 kcal and 9 grams of protein  ? ?-Multivitamin with minerals daily ? ?-Recommend supplement Vitamin B-12 ? ?-If numbness and tingling persist, recommend 100 mg thiamine daily and possibly check copper levels ? ?NUTRITION DIAGNOSIS:  ? 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 ? ?GOAL:  ? ?Patient will meet greater than or equal to 90% of their needs ? ?MONITOR:  ? ?PO intake, Supplement acceptance, Labs, Weight trends, I & O's ? ?REASON FOR ASSESSMENT:  ? ?Malnutrition Screening Tool ?  ? ?ASSESSMENT:  ? ?66 y.o. female with medical history significant of COPD, hypertension, CKD stage III, cervical stenosis presented with fall with worsening bilateral knee pain and left hand pain. ? ?Patient reports eating well,no issues with appetite. States she ate 2 full breakfast meals this morning. Pt bedbound r/t severe spinal cord stenosis. Contracted in both hands and legs. Pt reports she is still in a lot of pain. States her numbness/tingling began after her appendectomy in October 2022. ? ?Denies swallowing difficulty but states she is missing a lot of teeth with a sore in her mouth which makes it difficult to chew certain foods. Had no problem with breakfast though.  ?Declines supplements, "I don't like them". Will add Magic cups with meals and add daily MVI. Pt states she plans to start a daily MVI after discharge.  ?Pt found to be folate deficient. If numbness and tingling in extremities persists, may need thiamine supplementation and check copper levels. ? ?Per weight records, no weight recorded for this admission.  ?Pt reports UBW of ~200 lbs. ? ?Medications: Folic acid, Senokot ? ?Labs  reviewed: ? Low Na ?Low folate (5.2) ? ?NUTRITION - FOCUSED PHYSICAL EXAM: ? ?Flowsheet Row Most Recent Value  ?Orbital Region Mild depletion  ?Upper Arm Region Mild depletion  ?Thoracic and Lumbar Region Mild depletion  ?Buccal Region Mild depletion  ?Temple Region Mild depletion  ?Clavicle Bone Region Mild depletion  ?Clavicle and Acromion Bone Region Mild depletion  ?Scapular Bone Region Mild depletion  ?Dorsal Hand Mild depletion  ?Patellar Region Moderate depletion  [contractured, painful]  ?Anterior Thigh Region Moderate depletion  ?Posterior Calf Region Moderate depletion  ?Edema (RD Assessment) None  ?Hair Reviewed  [reports hair loss]  ?Eyes Reviewed  [reports vision changes]  ?Mouth Reviewed  [missing teeth] sore in mouth  ?Skin Reviewed  ? ?  ? ? ?Diet Order:   ?Diet Order   ? ?       ?  Diet regular Room service appropriate? Yes; Fluid consistency: Thin  Diet effective now       ?  ? ?  ?  ? ?  ? ? ?EDUCATION NEEDS:  ? ?Education needs have been addressed ? ?Skin:  Skin Assessment: Reviewed RN Assessment ? ?Last BM:  3/22 ? ?Height:  ? ?Ht Readings from Last 1 Encounters:  ?01/09/21 '5\' 6"'$  (1.676 m)  ? ? ?Weight:  ? ?Wt Readings from Last 1 Encounters:  ?01/09/21 61.2 kg  ? ? ?BMI:  There is no height or weight on file to calculate BMI. ? ?Estimated Nutritional Needs:  ? ?Kcal:  1550-1750 ? ?Protein:  75-90g ? ?Fluid:  1.8L/day ? ? ?Clayton Bibles, MS, RD, LDN ?Inpatient Clinical Dietitian ?Contact information available via Amion ? ?

## 2021-04-03 NOTE — Assessment & Plan Note (Addendum)
Lower extremity. Patient unable to fully extend knees. Suspected secondary to prolonged hospitalization. Patient to continue PT as an outpatient. ?

## 2021-04-03 NOTE — Assessment & Plan Note (Signed)
UDS is positive for cocaine ?

## 2021-04-04 DIAGNOSIS — F1410 Cocaine abuse, uncomplicated: Secondary | ICD-10-CM | POA: Diagnosis not present

## 2021-04-04 DIAGNOSIS — J449 Chronic obstructive pulmonary disease, unspecified: Secondary | ICD-10-CM

## 2021-04-04 DIAGNOSIS — R52 Pain, unspecified: Secondary | ICD-10-CM

## 2021-04-04 DIAGNOSIS — M24569 Contracture, unspecified knee: Secondary | ICD-10-CM | POA: Diagnosis not present

## 2021-04-04 DIAGNOSIS — M4802 Spinal stenosis, cervical region: Secondary | ICD-10-CM | POA: Diagnosis not present

## 2021-04-04 LAB — BASIC METABOLIC PANEL
Anion gap: 4 — ABNORMAL LOW (ref 5–15)
BUN: 23 mg/dL (ref 8–23)
CO2: 26 mmol/L (ref 22–32)
Calcium: 8.5 mg/dL — ABNORMAL LOW (ref 8.9–10.3)
Chloride: 105 mmol/L (ref 98–111)
Creatinine, Ser: 0.88 mg/dL (ref 0.44–1.00)
GFR, Estimated: >60 mL/min (ref >60)
Glucose, Bld: 96 mg/dL (ref 70–99)
Potassium: 4 mmol/L (ref 3.5–5.1)
Sodium: 135 mmol/L (ref 135–145)

## 2021-04-04 LAB — CBC WITH DIFFERENTIAL/PLATELET
Abs Immature Granulocytes: 0.01 10*3/uL (ref 0.00–0.07)
Basophils Absolute: 0 10*3/uL (ref 0.0–0.1)
Basophils Relative: 0 %
Eosinophils Absolute: 0.1 10*3/uL (ref 0.0–0.5)
Eosinophils Relative: 1 %
HCT: 34.7 % — ABNORMAL LOW (ref 36.0–46.0)
Hemoglobin: 11.5 g/dL — ABNORMAL LOW (ref 12.0–15.0)
Immature Granulocytes: 0 %
Lymphocytes Relative: 58 %
Lymphs Abs: 3.2 10*3/uL (ref 0.7–4.0)
MCH: 30.6 pg (ref 26.0–34.0)
MCHC: 33.1 g/dL (ref 30.0–36.0)
MCV: 92.3 fL (ref 80.0–100.0)
Monocytes Absolute: 0.3 10*3/uL (ref 0.1–1.0)
Monocytes Relative: 5 %
Neutro Abs: 2 10*3/uL (ref 1.7–7.7)
Neutrophils Relative %: 36 %
Platelets: 283 10*3/uL (ref 150–400)
RBC: 3.76 MIL/uL — ABNORMAL LOW (ref 3.87–5.11)
RDW: 13.3 % (ref 11.5–15.5)
WBC: 5.7 10*3/uL (ref 4.0–10.5)
nRBC: 0 % (ref 0.0–0.2)

## 2021-04-04 LAB — MAGNESIUM: Magnesium: 1.8 mg/dL (ref 1.7–2.4)

## 2021-04-04 NOTE — Progress Notes (Signed)
Physical Therapy Treatment ?Patient Details ?Name: [name] ?MRN: [id number] ?DOB: [birth year] ?Today's Date: 04/04/2021 ? ? ?History of Present Illness Pt admitted from home on 04/02/21 s/p falls and with c/o bil knee pain, R arm pain and L hand pain.  Pt dx with UTI and worsening cervical spinal stenosis.  Pt with hx of CKD, COPD, substance abuse (cocaine), RA, anterior cervical fusion and peripheral neuropathy ? ?  ?PT Comments  ? ? Pt remains limited by pain/spasms - mostly in back and hip flexors. Improved tolerance for stretching in heel cord and hamstrings.   Good relief in spasms with cross friction massage. Due to limited mobility, limited support, and fall hx continue to recommend SNF. ?   ?Recommendations for follow up therapy are one component of a multi-disciplinary discharge planning process, led by the attending physician.  Recommendations may be updated based on patient status, additional functional criteria and insurance authorization. ? ?Follow Up Recommendations ? Skilled nursing-short term [facility] (<3 hours/day) ?  ?  ?Assistance Recommended at Discharge Frequent or constant Supervision/Assistance  ?Patient can return home with the following A little help with walking and/or transfers;Assistance with cooking/housework;Assist for transportation;Help with stairs or ramp for entrance ?  ?Equipment Recommendations ?    ?  ?Recommendations for Other Services   ? ? ?  ?Precautions / Restrictions Precautions ?Precautions: Fall ?Restrictions ?Weight Bearing Restrictions: No  ?  ? ?Mobility ? Bed Mobility ?Overal bed mobility: Needs Assistance ?Bed Mobility: Supine to Sit, Sit to Supine ?  ?  ?Supine to sit: Supervision ?Sit to supine: Supervision ?  ?  ?  ? ?Transfers ?Overall transfer level: Needs assistance ?Equipment used: Rolling walker (2 wheels) ?Transfers: Sit to/from Stand ?Sit to Stand: Min guard ?  ?  ?  ?  ?  ?General transfer comment: Cues for hand placement and safety; performed from bed and  toilet ?  ? ?Ambulation/Gait ?Ambulation/Gait assistance: Min assist ?Gait Distance (Feet): 10 Feet (10'x2) ?Assistive device: Rolling walker (2 wheels) ?Gait Pattern/deviations: Step-to pattern, Decreased stride length, Trunk flexed ?Gait velocity: decr ?  ?  ?General Gait Details: Min A for balance; pt with flexed posture; limited due to pain ? ? ?Stairs ?  ?  ?  ?  ?  ? ? ?Wheelchair Mobility ?  ? ?Modified Rankin (Stroke Patients Only) ?  ? ? ?  ?Balance Overall balance assessment: Needs assistance ?Sitting-balance support: No upper extremity supported, Feet supported ?Sitting balance-Leahy Scale: Good ?  ?  ?Standing balance support: Bilateral upper extremity supported, No upper extremity supported ?Standing balance-Leahy Scale: Fair ?Standing balance comment: RW to ambulate but could static stand for ADLs without support ?  ?  ?  ?  ?  ?  ?  ?  ?  ?  ?  ?  ? ?  ?Cognition Arousal/Alertness: Awake/alert ?Behavior During Therapy: [facility] for tasks assessed/performed, Flat affect ?Overall Cognitive Status: Within Functional Limits for tasks assessed ?  ?  ?  ?  ?  ?  ?  ?  ?  ?  ?  ?  ?  ?  ?  ?  ?  ?  ?  ? ?  ?Exercises General Exercises - Lower Extremity ?Ankle Circles/Pumps: AROM, Both, 10 reps, Seated (with heel cord stretch at end range) ?Long Arc Quad: AROM, Both, 5 reps, Seated (with manual hamstring stretch end range 10 sec hold) ?Other Exercises ?Other Exercises: Attempted hip flexor stretch just by laying flat but pt developing cramps/spasms in hip  flexors and back and unable to tolerate. ?Other Exercises: Performed soft tissue mobilization/cross friction massage and pressure to bil hip flexors 2 mins each side.  Able to feel tension release in muscles.  Educated pt on cross friction massage as needed. ? ?  ?General Comments   ?  ?  ? ?Pertinent Vitals/Pain Pain Assessment ?Pain Assessment: 0-10 ?Pain Score: 7  ?Pain Location: knees, hips, back ?Pain Descriptors / Indicators: Grimacing, Guarding, Spasm,  Tightness ?Pain Intervention(s): Limited activity within patient's tolerance, Monitored during session, Utilized relaxation techniques, Relaxation, Repositioned  ? ? ?Home Living   ?  ?  ?  ?  ?  ?  ?  ?  ?  ?   ?  ?Prior Function    ?  ?  ?   ? ?PT Goals (current goals can now be found in the care plan section) Progress towards PT goals: Progressing toward goals ? ?  ?Frequency ? ? ? Min 3X/week ? ? ? ?  ?PT Plan Current plan remains appropriate  ? ? ?Co-evaluation   ?  ?  ?  ?  ? ?  ?AM-PAC PT "6 Clicks" Mobility   ?Outcome Measure ? Help needed turning from your back to your side while in a flat bed without using bedrails?: None ?Help needed moving from lying on your back to sitting on the side of a flat bed without using bedrails?: A Little ?Help needed moving to and from a bed to a chair (including a wheelchair)?: A Little ?Help needed standing up from a chair using your arms (e.g., wheelchair or bedside chair)?: A Little ?Help needed to walk in hospital room?: Total ?Help needed climbing 3-5 steps with a railing? : Total ?6 Click Score: 15 ? ?  ?End of Session Equipment Utilized During Treatment: Gait belt ?Activity Tolerance: Patient limited by pain ?Patient left: in bed;with call bell/phone within reach;with bed alarm set ?Nurse Communication: Mobility status ?PT Visit Diagnosis: Unsteadiness on feet (R26.81);Muscle weakness (generalized) (M62.81);History of falling (Z91.81);Difficulty in walking, not elsewhere classified (R26.2);Pain ?Pain - part of body: Hip;Knee ?  ? ? ?Time: 4163-8453 ?PT Time Calculation (min) (ACUTE ONLY): 16 min ? ?Charges:  $Therapeutic Exercise: 8-22 mins          ?          ? ?[name], PT ?[facility] ?Pager [phone] ?[name] [phone] ? ? ? ?[name] ?04/04/2021, 4:51 PM ? ?

## 2021-04-04 NOTE — Progress Notes (Signed)
?PROGRESS NOTE ? ? ? ?[name]  ULA:[id number] DOB: [birth year] DOA: 04/02/2021 ?PCP: System, Provider Not In  ? ?Brief Narrative:  ?66 y.o. female with medical history significant of COPD, hypertension, CKD stage III, cervical stenosis presented with fall with worsening bilateral knee pain and left hand pain.  On presentation, MRI of brain was negative for acute stroke.  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  Neurosurgery evaluated the patient: Recommended that patient is not a good operative candidate given her history of noncompliance and cocaine use and recommended outpatient follow-up with Dr. Ronnald Ramp at discharge.  No c-collar recommended by neurosurgery.  PT recommended SNF placement. ? ?Assessment & Plan: ?  ?Spinal stenosis in cervical region ?-History of ACDF for cervical stenosis.  Follows up with Dr. Ronnald Ramp and has missed multiple appointments in the office ?-Neurosurgery evaluated the patient: Recommended that patient is not a good operative candidate given her history of noncompliance and cocaine use and recommended outpatient follow-up with Dr. Ronnald Ramp at discharge.  No c-collar recommended by neurosurgery. ?-PT recommended SNF placement. ?-Continue pain management and bowel regimen.  Still complains of significant pain. ? ?Contracture of joint, lower leg ?-Contracture of bilateral lower extremity.  Patient unable to fully extend both knees.  Suspect due to prolonged hospitalization in November and mostly bedbound and secondary lifestyle ?-Continue PT evaluation and follow-up. ? ?Contracture of left hand ?-Will need outpatient follow-up ? ?UTI: Present on admission ?-Follow cultures: Negative so far.   Continue Rocephin ? ?Peripheral neuropathy ?-Continue home amitriptyline, gabapentin and Cymbalta ? ?Folate deficiency ?-Supplement orally ? ?Possible vitamin B12 deficiency ?-Vitamin B12 at the lower limit of normal.  Continue supplementation. ? ?Cocaine abuse ?-UDS positive for cocaine.  Counseled regarding abstinence.  TOC consult ? ?COPD ?-Continue inhaled regimen.  Currently stable ? ?Essential hypertension ?-Continue home amlodipine.  Monitor blood pressure ? ?Hyponatremia ?-Resolved. ? ?DVT prophylaxis: Start Lovenox ?Code Status: Full ?Family Communication: None at bedside ?Disposition Plan: ?Status is:  inpatient because: Of severe pain.  Need for SNF placement and IV antibiotics. ? ? ?Consultants: Neurosurgery ? ?Procedures: None ? ?Antimicrobials: Rocephin from 04/02/2021 ? ? ?Subjective: ?Patient seen and examined at bedside.  Poor historian.  Still complains of significant lower extremity pain and hand pain.  No fever, vomiting, chest pain or worsening shortness of breath reported. ?Objective: ?Vitals:  ? 04/03/21 1429 04/03/21 2221 04/04/21 0526 04/04/21 0748  ?BP: 131/73 116/68 132/82   ?Pulse: 92 87 72   ?Resp: '17 17 17   '$ ?Temp: 98.2 ?F (36.8 ?C) 98.1 ?F (36.7 ?C) 98.3 ?F (36.8 ?C)   ?TempSrc: Oral Oral Oral   ?SpO2: 93% 94% 92% 94%  ? ? ?Intake/Output Summary (Last 24 hours) at 04/04/2021 0912 ?Last data filed at 04/04/2021 0200 ?Gross per 24 hour  ?Intake 578.22 ml  ?Output 601 ml  ?Net -22.78 ml  ? ? ?There were no vitals filed for this visit. ? ?Examination: ? ?General exam: No distress.  Looks chronically ill and deconditioned.  On room air currently.  Awake, looks intermittently anxious.  Poor historian. ?Respiratory system: Decreased breath sounds at bases bilaterally, no wheezing cardiovascular system: Currently rate controlled; S1-S2 heard  ?gastrointestinal system: Abdomen is distended slightly; soft and nontender.  Bowel sounds are heard  ?extremities: Mild lower extremity edema present; no  clubbing  ? ? ?Data Reviewed: I have personally reviewed following labs and imaging studies ? ?CBC: ?Recent Labs  ?Lab 04/02/21 ?1115 04/02/21 ?1124 04/04/21 ?0300  ?WBC 5.5  --  5.7  ?NEUTROABS 2.1  --  2.0  ?HGB 12.6 13.6 11.5*  ?HCT 38.7 40.0 34.7*  ?MCV 93.5  --  92.3  ?PLT 292  --  283  ? ? ?Basic Metabolic Panel: ?Recent Labs  ?Lab 04/02/21 ?1115 04/02/21 ?1124 04/03/21 ?0458 04/04/21 ?9233  ?NA 134* 138 132* 135  ?K 4.5 4.2 5.1 4.0  ?CL 103 103 105 105  ?CO2 27  --  20* 26  ?GLUCOSE 103* 95 119* 96  ?BUN '16 15 23 23  '$ ?CREATININE 0.95 0.80 1.06* 0.88  ?CALCIUM 8.8*  --  9.0 8.5*  ?MG  --   --   --  1.8  ? ? ?GFR: ?CrCl cannot be calculated (Unknown ideal weight.). ?Liver Function Tests: ?Recent Labs  ?Lab 04/02/21 ?1115  ?AST 15  ?ALT 9  ?ALKPHOS 114  ?BILITOT 0.8  ?PROT 7.7  ?ALBUMIN 3.7  ? ? ?No results for input(s): LIPASE, AMYLASE in the last 168 hours. ?No results for input(s): AMMONIA in the last 168 hours. ?Coagulation Profile: ?Recent Labs  ?Lab 04/02/21 ?1115  ?INR 0.9  ? ? ?Cardiac Enzymes: ?No results for input(s): CKTOTAL, CKMB, CKMBINDEX, TROPONINI in the last 168 hours. ?BNP (last 3 results) ?No results for input(s): PROBNP in the last 8760 hours. ?HbA1C: ?Recent Labs  ?  04/03/21 ?0458  ?HGBA1C 5.4  ? ? ?CBG: ?No results for input(s): GLUCAP in the last 168 hours. ?Lipid Profile: ?No results for input(s): CHOL, HDL, LDLCALC, TRIG, CHOLHDL, LDLDIRECT in the last 72 hours. ?Thyroid Function Tests: ?No results for input(s): TSH, T4TOTAL, FREET4, T3FREE, THYROIDAB in the last 72 hours. ?Anemia Panel: ?Recent Labs  ?  04/03/21 ?0458  ?VITAMINB12 229  ?FOLATE 5.2*  ? ? ?Sepsis Labs: ?No results for input(s): PROCALCITON, LATICACIDVEN in the last 168 hours. ? ?Recent Results (from the past 240 hour(s))  ?Resp Panel by RT-PCR (Flu A&B, Covid) Nasopharyngeal Swab     Status: None  ? Collection Time: 04/02/21 11:14 AM  ? Specimen: Nasopharyngeal Swab; Nasopharyngeal(NP) swabs in vial transport medium   ?Result Value Ref Range Status  ? SARS Coronavirus 2 by RT PCR NEGATIVE NEGATIVE Final  ?  Comment: (NOTE) ?SARS-CoV-2 target nucleic acids are NOT DETECTED. ? ?The SARS-CoV-2 RNA is generally detectable in upper respiratory ?specimens during the acute phase of infection. The lowest ?concentration of SARS-CoV-2 viral copies this assay can detect is ?138 copies/mL. A negative result does not preclude SARS-Cov-2 ?infection and should not be used as the sole basis for treatment or ?other patient management decisions. A negative result may occur with  ?improper specimen collection/handling, submission of specimen other ?than nasopharyngeal swab, presence of viral mutation(s) within the ?areas targeted by this assay, and inadequate number of viral ?copies(<138 copies/mL). A negative result must be combined with ?clinical observations, patient history, and epidemiological ?information. The expected result is Negative. ? ?Fact Sheet for Patients:  ?EntrepreneurPulse.com.au ? ?Fact Sheet for Healthcare Providers:  ?IncredibleEmployment.be ? ?This test is no t yet approved or cleared by the [place] FDA and  ?has been authorized for detection and/or diagnosis of SARS-CoV-2 by ?FDA under an Emergency Use Authorization (EUA). This EUA will remain  ?in effect (meaning this test can be used) for the  duration of the ?COVID-19 declaration under Section 564(b)(1) of the Act, 21 ?U.S.C.section 360bbb-3(b)(1), unless the authorization is terminated  ?or revoked sooner.  ? ? ?  ? Influenza A by PCR NEGATIVE NEGATIVE Final  ? Influenza B by PCR NEGATIVE NEGATIVE Final  ?  Comment: (NOTE) ?The Xpert Xpress SARS-CoV-2/FLU/RSV plus assay is intended as an aid ?in the diagnosis of influenza from Nasopharyngeal swab specimens and ?should not be used as a sole basis for treatment. Nasal washings and ?aspirates are unacceptable for Xpert Xpress SARS-CoV-2/FLU/RSV ?testing. ? ?Fact Sheet for  Patients: ?EntrepreneurPulse.com.au ? ?Fact Sheet for Healthcare Providers: ?IncredibleEmployment.be ? ?This test is not yet approved or cleared by the [place] and ?has been [place]

## 2021-04-05 DIAGNOSIS — M24569 Contracture, unspecified knee: Secondary | ICD-10-CM | POA: Diagnosis not present

## 2021-04-05 DIAGNOSIS — R52 Pain, unspecified: Secondary | ICD-10-CM | POA: Diagnosis not present

## 2021-04-05 DIAGNOSIS — M4802 Spinal stenosis, cervical region: Secondary | ICD-10-CM | POA: Diagnosis not present

## 2021-04-05 DIAGNOSIS — F1410 Cocaine abuse, uncomplicated: Secondary | ICD-10-CM | POA: Diagnosis not present

## 2021-04-05 LAB — URINE CULTURE: Culture: 50000 — AB

## 2021-04-05 MED ORDER — VITAMIN B-12 1000 MCG PO TABS
1000.0000 ug | ORAL_TABLET | Freq: Every day | ORAL | Status: DC
Start: 2021-04-05 — End: 2021-04-08
  Administered 2021-04-05 – 2021-04-08 (×4): 1000 ug via ORAL
  Filled 2021-04-05 (×4): qty 1

## 2021-04-05 MED ORDER — LIDOCAINE 5 % EX PTCH
2.0000 | MEDICATED_PATCH | CUTANEOUS | Status: DC
  Administered 2021-04-05 – 2021-04-08 (×4): 2 via TRANSDERMAL
  Filled 2021-04-05 (×5): qty 2

## 2021-04-05 MED ORDER — ALUM & MAG HYDROXIDE-SIMETH 200-200-20 MG/5ML PO SUSP
30.0000 mL | ORAL | Status: DC | PRN
  Administered 2021-04-05: 30 mL via ORAL
  Filled 2021-04-05: qty 30

## 2021-04-05 MED ORDER — DICLOFENAC SODIUM 1 % EX GEL
2.0000 g | Freq: Four times a day (QID) | CUTANEOUS | Status: DC | PRN
  Administered 2021-04-05: 2 g via TOPICAL
  Filled 2021-04-05 (×3): qty 100

## 2021-04-05 NOTE — TOC Initial Note (Signed)
Transition of Care (TOC) - Initial/Assessment Note  ? ? ?Patient Details  ?Name: [name] ?MRN: [id number] ?Date of Birth: Oct 06, 1955 ? ?Transition of Care (TOC) CM/SW Contact:    ?Tawanna Cooler, RN ?Phone Number: ?04/05/2021, 4:50 PM ? ?Clinical Narrative:                 ? ?Patient will need substance abuse resources and work up for possible SNF.  Not a candidate for surgery due to poor compliance in the past, including not showing up for scheduled surgery.  ?TOC to send out referral for possible bed offers.   ? ?Expected Discharge Plan: [place] ?Barriers to Discharge: Continued Medical Work up ? ?Expected Discharge Plan and Services ?Expected Discharge Plan: [place] ?  ?  ?  ?Living arrangements for the past 2 months: [place] ?                ?  ?Prior Living Arrangements/Services ?Living arrangements for the past 2 months: [place] ?Lives with:: Self ?Patient language and need for interpreter reviewed:: Yes ?       ?Need for Family Participation in Patient Care: Yes (Comment) ?Care giver support system in place?: Yes (comment) ?  ?Criminal Activity/Legal Involvement Pertinent to Current Situation/Hospitalization: No - Comment as needed ? ?Activities of Daily Living ?Home Assistive Devices/Equipment: Cane (specify quad or straight), Walker (specify type), Eyeglasses, Bedside commode/3-in-1 ?ADL Screening (condition at time of admission) ?Patient's cognitive ability adequate to safely complete daily activities?: Yes ?Is the patient deaf or have difficulty hearing?: Yes (buzzing in ears) ?Does the patient have difficulty seeing, even when wearing glasses/contacts?: No ?Does the patient have difficulty concentrating, remembering, or making decisions?: Yes ?Patient able to express need for assistance with ADLs?: Yes ?Does the patient have difficulty dressing or bathing?: Yes ?Independently performs ADLs?: No ?Communication: Independent ?Dressing (OT):  Needs assistance ?Is this a change from baseline?: Pre-admission baseline ?Grooming: Independent ?Feeding: Independent ?Bathing: Needs assistance ?Is this a change from baseline?: Pre-admission baseline ?Toileting: Needs assistance ?Is this a change from baseline?: Pre-admission baseline ?In/Out Bed: Needs assistance ?Is this a change from baseline?: Pre-admission baseline ?Walks in Home: Dependent ?Is this a change from baseline?: Pre-admission baseline ?Does the patient have difficulty walking or climbing stairs?: Yes ?Weakness of Legs: Both ?Weakness of Arms/Hands: Both ? ?  ?Orientation: : Oriented to Self, Oriented to Place, Oriented to  Time, Oriented to Situation ?Alcohol / Substance Use: Tobacco Use, Illicit Drugs (cocaine) ?Psych Involvement: No (comment) ? ?Admission diagnosis:  Lower urinary tract infectious disease [N39.0] ?Neck mass [R22.1] ?Cervical spinal stenosis [M48.02] ?Intractable pain [R52] ?Severe pain [R52] ?Patient Active Problem List  ? Diagnosis Date Noted  ? Contracture of joint, lower leg 04/03/2021  ? Dupuytren's contracture of left hand 04/03/2021  ? UTI (urinary tract infection) 04/03/2021  ? Severe pain 04/03/2021  ? Intractable pain 04/02/2021  ? Pleural effusion 11/12/2020  ? Dyspnea on exertion 11/11/2020  ? Perforated appendicitis 11/02/2020  ? Chronic kidney disease, stage 3a ([place]) 11/02/2020  ? Colitis 06/23/2018  ? Coronary artery calcification 02/23/2017  ? Closed nondisplaced fracture of lateral malleolus of right fibula 10/28/2016  ? Noninfectious gastroenteritis   ? Hematochezia 09/01/2016  ? Acute diarrhea 08/28/2016  ? Hydronephrosis, right 06/29/2015  ? Hyperkalemia 06/29/2015  ? HCAP (healthcare-associated pneumonia) 06/29/2015  ? Peripheral neuropathy 06/29/2015  ? Candida infection, oral 06/28/2015  ? Anemia due to other cause 06/10/2015  ? Hypoalbuminemia due to protein-calorie  malnutrition ([place]) 06/10/2015  ? Ankle fracture, right 06/03/2015  ? Severe sepsis ([place])  05/29/2015  ? Spinal stenosis in cervical region 05/12/2015  ? S/P cervical spinal fusion 04/17/2015  ? Acute cystitis without hematuria   ? Vomiting   ? Constipation 02/17/2015  ? CAP (community acquired pneumonia) 02/15/2015  ? Encephalopathy, metabolic 29/79/8921  ? Cocaine abuse with cocaine-induced disorder ([place]) 02/14/2015  ? Sepsis ([place]) 10/28/2014  ? SIRS (systemic inflammatory response syndrome) ([place]) 10/28/2014  ? Sore throat 10/28/2014  ? Acute encephalopathy 10/28/2014  ? Metabolic acidosis   ? Leukocytosis   ? Hypotension 09/10/2014  ? Dehydration 09/10/2014  ? Chest pain at rest 09/09/2014  ? Tobacco abuse 09/09/2014  ? Mixed hyperlipidemia 09/09/2014  ? COPD (chronic obstructive pulmonary disease) ([place]) 09/09/2014  ? GERD without esophagitis 09/09/2014  ? Chest pain 08/20/2014  ? Nausea & vomiting 08/20/2014  ? Acute renal failure superimposed on stage 3 chronic kidney disease ([place]) 08/20/2014  ? Lower urinary tract infectious disease 08/20/2014  ? Pain in the chest   ? Pain 02/11/2013  ? Cocaine abuse ([place]) 02/11/2013  ? Rheumatoid arthritis flare ([place]) 02/11/2013  ? Acute renal failure ([place]) 02/11/2013  ? AKI (acute kidney injury) ([place]) 02/11/2013  ? Hiatal hernia with gastroesophageal reflux 10/11/2012  ? Abdominal mass 10/08/2012  ? Knee pain 10/08/2012  ? Essential hypertension 10/08/2012  ? Pancreatic mass 10/07/2012  ? Abdominal pain 10/07/2012  ? Rheumatoid arthritis ([place])   ? ?PCP:  System, Provider Not In ?Pharmacy:   ?[place] [place] [facility] [place] ?[address] ?[place] [address] ?Phone: [phone] Fax: [phone] ? ?[facility] ?[address] ?[place] [address] ?Phone: [phone] Fax: [phone] ? ? ? ?Readmission Risk Interventions ? ?  11/13/2020  ? 10:26 AM 11/10/2020  ? 11:54 AM  ?Readmission Risk Prevention Plan  ?Transportation Screening Complete Complete  ?PCP or Specialist Appt within 5-7 Days   Complete  ?PCP or Specialist Appt within 3-5 Days Complete   ?Home Care Screening  Complete  ?Medication Review (RN CM)  Complete  ?[place] or Home Care Consult Complete   ?Social Work Consult for [place] Planning/Counseling Complete   ?Palliative Care Screening Complete   ?Medication Review Press photographer) Complete   ? ? ? ?

## 2021-04-05 NOTE — Progress Notes (Signed)
?PROGRESS NOTE ? ? ? ?[name]  RAQ:[id number] DOB: [birth year] DOA: 04/02/2021 ?PCP: System, Provider Not In  ? ?Brief Narrative:  ?66 y.o. female with medical history significant of COPD, hypertension, CKD stage III, cervical stenosis presented with fall with worsening bilateral knee pain and left hand pain.  On presentation, MRI of brain was negative for acute stroke.  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  Neurosurgery evaluated the patient: Recommended that patient is not a good operative candidate given her history of noncompliance and cocaine use and recommended outpatient follow-up with Dr. Ronnald Ramp at discharge.  No c-collar recommended by neurosurgery.  PT recommended SNF placement. ? ?Assessment & Plan: ?  ?Spinal stenosis in cervical region ?-History of ACDF for cervical stenosis.  Follows up with Dr. Ronnald Ramp and has missed multiple appointments in the office ?-Neurosurgery evaluated the patient: Recommended that patient is not a good operative candidate given her history of noncompliance and cocaine use and recommended outpatient follow-up with Dr. Ronnald Ramp at discharge.  No c-collar recommended by neurosurgery. ?-PT recommended SNF placement. ?-Continue pain management and bowel regimen.  Still complains of significant pain. ? ?Contracture of joint, lower leg ?-Contracture of bilateral lower extremity.  Patient unable to fully extend both knees.  Suspect due to prolonged hospitalization in November and mostly bedbound and secondary lifestyle ?-Continue PT evaluation and follow-up. ? ?Contracture of left hand ?-Will need outpatient follow-up ? ?UTI: Present on admission ?-Follow cultures: Urine culture growing E.  coli.  Continue Rocephin ? ?Peripheral neuropathy ?-Continue home amitriptyline, gabapentin and Cymbalta ? ?Folate deficiency ?-Supplement orally ? ?Possible vitamin B12 deficiency ?-Vitamin B12 at the lower limit of normal.  Continue supplementation. ? ?Cocaine abuse ?-UDS positive for cocaine.  Counseled regarding abstinence.  TOC consult ? ?COPD ?-Continue inhaled regimen.  Currently stable ? ?Essential hypertension ?-Continue home amlodipine.  Monitor blood pressure ? ?Hyponatremia ?-Resolved.  No labs today. ? ?Moderate malnutrition ?-Follow nutrition recommendations ? ?DVT prophylaxis: SCDs.  Not on heparin/Lovenox because of allergies to heparin  ?code Status: Full ?Family Communication: None at bedside ?Disposition Plan: ?Status is:  inpatient because: Of severe pain.  Need for SNF placement.  Currently medically stable for discharge to SNF ? ? ?Consultants: Neurosurgery ? ?Procedures: None ? ?Antimicrobials: Rocephin from 04/02/2021 ? ? ?Subjective: ?Patient seen and examined at bedside.  Poor historian.  No overnight chest pain, vomiting, fever or shortness of breath reported.  Still complains of significant pain in bilateral lower extremities and hands.   ?Objective: ?Vitals:  ? 04/04/21 4562 04/04/21 1402 04/04/21 2205 04/05/21 0415  ?BP:  135/74 115/70 122/71  ?Pulse:  85 73 72  ?Resp:  '19 18 18  '$ ?Temp:  98.9 ?F (37.2 ?C) 98.2 ?F (36.8 ?C) 98.1 ?F (36.7 ?C)  ?TempSrc:  Oral Oral Oral  ?SpO2: 94% 99% 95% 98%  ? ? ?Intake/Output Summary (Last 24 hours) at 04/05/2021 0810 ?Last data filed at 04/04/2021 1909 ?Gross per 24 hour  ?Intake 480 ml  ?Output 600 ml  ?Net -120 ml  ? ? ?There were no vitals filed for this visit. ? ?Examination: ? ?General exam: On room air.  No acute distress.  Looks chronically ill and deconditioned.    Awake, looks intermittently  anxious.  Poor historian. ?Respiratory system: Bilateral decreased breath sounds at bases with scattered crackles cardiovascular system: S1-S2 heard; rate  controlled currently  ?gastrointestinal system: Abdomen is mildly distended; soft and nontender.  Normal bowel sounds heard  ?extremities: No cyanosis; trace lower extremity edema present ? ? ?Data Reviewed: I have personally reviewed following labs and imaging studies ? ?CBC: ?Recent Labs  ?Lab 04/02/21 ?1115 04/02/21 ?1124 04/04/21 ?7253  ?WBC 5.5  --  5.7  ?NEUTROABS 2.1  --  2.0  ?HGB 12.6 13.6 11.5*  ?HCT 38.7 40.0 34.7*  ?MCV 93.5  --  92.3  ?PLT 292  --  283  ? ? ?Basic Metabolic Panel: ?Recent Labs  ?Lab 04/02/21 ?1115 04/02/21 ?1124 04/03/21 ?0458 04/04/21 ?6644  ?NA 134* 138 132* 135  ?K 4.5 4.2 5.1 4.0  ?CL 103 103 105 105  ?CO2 27  --  20* 26  ?GLUCOSE 103* 95 119* 96  ?BUN '16 15 23 23  '$ ?CREATININE 0.95 0.80 1.06* 0.88  ?CALCIUM 8.8*  --  9.0 8.5*  ?MG  --   --   --  1.8  ? ? ?GFR: ?CrCl cannot be calculated (Unknown ideal weight.). ?Liver Function Tests: ?Recent Labs  ?Lab 04/02/21 ?1115  ?AST 15  ?ALT 9  ?ALKPHOS 114  ?BILITOT 0.8  ?PROT 7.7  ?ALBUMIN 3.7  ? ? ?No results for input(s): LIPASE, AMYLASE in the last 168 hours. ?No results for input(s): AMMONIA in the last 168 hours. ?Coagulation Profile: ?Recent Labs  ?Lab 04/02/21 ?1115  ?INR 0.9  ? ? ?Cardiac Enzymes: ?No results for input(s): CKTOTAL, CKMB, CKMBINDEX, TROPONINI in the last 168 hours. ?BNP (last 3 results) ?No results for input(s): PROBNP in the last 8760 hours. ?HbA1C: ?Recent Labs  ?  04/03/21 ?0458  ?HGBA1C 5.4  ? ? ?CBG: ?No results for input(s): GLUCAP in the last 168 hours. ?Lipid Profile: ?No results for input(s): CHOL, HDL, LDLCALC, TRIG, CHOLHDL, LDLDIRECT in the last 72 hours. ?Thyroid Function Tests: ?No results for input(s): TSH, T4TOTAL, FREET4, T3FREE, THYROIDAB in the last 72 hours. ?Anemia Panel: ?Recent Labs  ?  04/03/21 ?0458  ?VITAMINB12 229  ?FOLATE 5.2*  ? ? ?Sepsis Labs: ?No results for input(s): PROCALCITON, LATICACIDVEN in the last 168 hours. ? ?Recent Results (from the past 240 hour(s))  ?Resp Panel by RT-PCR  (Flu A&B, Covid) Nasopharyngeal Swab     Status: None  ? Collection Time: 04/02/21 11:14 AM  ? Specimen: Nasopharyngeal Swab; Nasopharyngeal(NP) swabs in vial transport medium  ?Result Value Ref Range Status  ? SARS Coronavirus 2 by RT PCR NEGATIVE NEGATIVE Final  ?  Comment: (NOTE) ?SARS-CoV-2 target nucleic acids are NOT DETECTED. ? ?The SARS-CoV-2 RNA is generally detectable in upper respiratory ?specimens during the acute phase of infection. The lowest ?concentration of SARS-CoV-2 viral copies this assay can detect is ?138 copies/mL. A negative result does not preclude SARS-Cov-2 ?infection and should not be used as the sole basis for treatment or ?other patient management decisions. A negative result may occur with  ?improper specimen collection/handling, submission of specimen other ?than nasopharyngeal swab, presence of viral mutation(s) within the ?areas targeted by this assay, and inadequate number of viral ?copies(<138 copies/mL). A negative result must be combined with ?clinical observations, patient history, and epidemiological ?information. The expected result is Negative. ? ?Fact Sheet for Patients:  ?EntrepreneurPulse.com.au ? ?Fact Sheet for Healthcare Providers:  ?IncredibleEmployment.be ? ?This test is no t yet approved or cleared by the [place] and  ?  has been authorized for detection and/or diagnosis of SARS-CoV-2 by ?FDA under an Emergency Use Authorization (EUA). This EUA will remain  ?in effect (meaning this test can be used) for the duration of the ?COVID-19 declaration under Section 564(b)(1) of the Act, 21 ?U.S.C.section 360bbb-3(b)(1), unless the authorization is terminated  ?or revoked sooner.  ? ? ?  ? Influenza A by PCR NEGATIVE NEGATIVE Final  ? Influenza B by PCR NEGATIVE NEGATIVE Final  ?  Comment: (NOTE) ?The Xpert Xpress SARS-CoV-2/FLU/RSV plus assay is intended as an aid ?in the diagnosis of influenza from Nasopharyngeal swab specimens  and ?should not be used as a sole basis for treatment. Nasal washings and ?aspirates are unacceptable for Xpert Xpress SARS-CoV-2/FLU/RSV ?testing. ? ?Fact Sheet for Patients: ?SeeState.com.cy

## 2021-04-05 NOTE — NC FL2 (Signed)
?[place] MEDICAID FL2 LEVEL OF CARE SCREENING TOOL  ?  ? ?IDENTIFICATION  ?Patient Name: ?[name] Birthdate: 10-06-1955 Sex: female Admission Date (Current Location): ?04/02/2021  ?[place] and [place] Number: ? [place] ?  Facility and Address:  ?[facility],  [address], [place] ?     Provider Number: ?[id number]  ?Attending Physician Name and Address:  ?Aline August, MD ? Relative Name and Phone Number:  ?Daughter, Roxanne Mins, [phone] ?   ?Current Level of Care: ?Hospital Recommended Level of Care: ?[place] Prior Approval Number: ?  ? ?Date Approved/Denied: ?  PASRR Number: ?  ? ?Discharge Plan: ?SNF ?  ? ?Current Diagnoses: ?Patient Active Problem List  ? Diagnosis Date Noted  ? Contracture of joint, lower leg 04/03/2021  ? Dupuytren's contracture of left hand 04/03/2021  ? UTI (urinary tract infection) 04/03/2021  ? Severe pain 04/03/2021  ? Intractable pain 04/02/2021  ? Pleural effusion 11/12/2020  ? Dyspnea on exertion 11/11/2020  ? Perforated appendicitis 11/02/2020  ? Chronic kidney disease, stage 3a ([place]) 11/02/2020  ? Colitis 06/23/2018  ? Coronary artery calcification 02/23/2017  ? Closed nondisplaced fracture of lateral malleolus of right fibula 10/28/2016  ? Noninfectious gastroenteritis   ? Hematochezia 09/01/2016  ? Acute diarrhea 08/28/2016  ? Hydronephrosis, right 06/29/2015  ? Hyperkalemia 06/29/2015  ? HCAP (healthcare-associated pneumonia) 06/29/2015  ? Peripheral neuropathy 06/29/2015  ? Candida infection, oral 06/28/2015  ? Anemia due to other cause 06/10/2015  ? Hypoalbuminemia due to protein-calorie malnutrition ([place]) 06/10/2015  ? Ankle fracture, right 06/03/2015  ? Severe sepsis ([place]) 05/29/2015  ? Spinal stenosis in cervical region 05/12/2015  ? S/P cervical spinal fusion 04/17/2015  ? Acute cystitis without hematuria   ? Vomiting   ? Constipation 02/17/2015  ? CAP (community acquired pneumonia) 02/15/2015  ? Encephalopathy,  metabolic 70/01/7492  ? Cocaine abuse with cocaine-induced disorder ([place]) 02/14/2015  ? Sepsis ([place]) 10/28/2014  ? SIRS (systemic inflammatory response syndrome) ([place]) 10/28/2014  ? Sore throat 10/28/2014  ? Acute encephalopathy 10/28/2014  ? Metabolic acidosis   ? Leukocytosis   ? Hypotension 09/10/2014  ? Dehydration 09/10/2014  ? Chest pain at rest 09/09/2014  ? Tobacco abuse 09/09/2014  ? Mixed hyperlipidemia 09/09/2014  ? COPD (chronic obstructive pulmonary disease) ([place]) 09/09/2014  ? GERD without esophagitis 09/09/2014  ? Chest pain 08/20/2014  ? Nausea & vomiting 08/20/2014  ? Acute renal failure superimposed on stage 3 chronic kidney disease ([place]) 08/20/2014  ? Lower urinary tract infectious disease 08/20/2014  ? Pain in the chest   ? Pain 02/11/2013  ? Cocaine abuse ([place]) 02/11/2013  ? Rheumatoid arthritis flare ([place]) 02/11/2013  ? Acute renal failure ([place]) 02/11/2013  ? AKI (acute kidney injury) ([place]) 02/11/2013  ? Hiatal hernia with gastroesophageal reflux 10/11/2012  ? Abdominal mass 10/08/2012  ? Knee pain 10/08/2012  ? Essential hypertension 10/08/2012  ? Pancreatic mass 10/07/2012  ? Abdominal pain 10/07/2012  ? Rheumatoid arthritis ([place])   ? ? ?Orientation RESPIRATION BLADDER Height & Weight   ?  ?Self, Time, Situation, Place ? Normal Indwelling catheter Weight:   ?Height:     ?BEHAVIORAL SYMPTOMS/MOOD NEUROLOGICAL BOWEL NUTRITION STATUS  ?    Continent Diet (regular)  ?AMBULATORY STATUS COMMUNICATION OF NEEDS Skin   ?Limited Assist Verbally Normal ?  ?  ?  ?    ?     ?     ? ? ?Personal Care Assistance Level of Assistance  ?Bathing, Feeding Bathing  Assistance: Limited assistance ?Feeding assistance: Limited assistance ?  ?   ? ?Functional Limitations Info  ?Sight, Speech, Hearing Sight Info: Adequate ?Hearing Info: Adequate ?Speech Info: Adequate  ? ? ?SPECIAL CARE FACTORS FREQUENCY  ?PT (By licensed PT), OT (By licensed OT)   ?  ?PT Frequency: 5x/week ?OT Frequency: 5x/week ?  ?  ?  ?    ? ? ?Contractures Contractures Info: Not present  ? ? ?Additional Factors Info  ?Code Status, Allergies Code Status Info: Full ?Allergies Info: iodine 131; levofloxacin; zofran; heparin; morphine ?  ?  ?  ?   ? ?Current Medications (04/05/2021):  This is the current hospital active medication list ?Current Facility-Administered Medications  ?Medication Dose Route Frequency Provider Last Rate Last Admin  ? acetaminophen (TYLENOL) tablet 650 mg  650 mg Oral Q6H PRN Tu, Ching T, DO      ? alum & mag hydroxide-simeth (MAALOX/MYLANTA) 200-200-20 MG/5ML suspension 30 mL  30 mL Oral Q4H PRN Starla Link, Kshitiz, MD   30 mL at 04/05/21 0432  ? amitriptyline (ELAVIL) tablet 50 mg  50 mg Oral QHS Tu, Ching T, DO   50 mg at 04/04/21 2140  ? amLODipine (NORVASC) tablet 10 mg  10 mg Oral q morning Tu, Ching T, DO   10 mg at 04/05/21 9892  ? atorvastatin (LIPITOR) tablet 40 mg  40 mg Oral QHS Tu, Ching T, DO   40 mg at 04/04/21 2140  ? bisacodyl (DULCOLAX) suppository 10 mg  10 mg Rectal Daily PRN Aline August, MD      ? cefTRIAXone (ROCEPHIN) 1 g in sodium chloride 0.9 % 100 mL IVPB  1 g Intravenous Q24H Tu, Ching T, DO 200 mL/hr at 04/05/21 0950 1 g at 04/05/21 0950  ? DULoxetine (CYMBALTA) DR capsule 30 mg  30 mg Oral Daily Tu, Ching T, DO   30 mg at 04/05/21 0805  ? folic acid (FOLVITE) tablet 1 mg  1 mg Oral Daily Starla Link, Kshitiz, MD   1 mg at 04/05/21 0805  ? gabapentin (NEURONTIN) capsule 400 mg  400 mg Oral QHS Polly Cobia, RPH   400 mg at 04/04/21 2140  ? lidocaine (LIDODERM) 5 % 2 patch  2 patch Transdermal Q24H Aline August, MD   2 patch at 04/05/21 0946  ? linaclotide (LINZESS) capsule 145 mcg  145 mcg Oral QAC breakfast Ileene Musa T, DO   145 mcg at 04/04/21 1194  ? methocarbamol (ROBAXIN) tablet 500 mg  500 mg Oral Q6H PRN Tu, Ching T, DO   500 mg at 04/05/21 1740  ? oxyCODONE-acetaminophen (PERCOCET/ROXICET) 5-325 MG per tablet 2 tablet  2 tablet Oral Q4H PRN Tu, Ching T, DO   2 tablet at 04/05/21 1333  ?  pantoprazole (PROTONIX) EC tablet 40 mg  40 mg Oral Daily Tu, Ching T, DO   40 mg at 04/05/21 0805  ? polyethylene glycol (MIRALAX / GLYCOLAX) packet 17 g  17 g Oral Daily PRN Tu, Ching T, DO      ? senna-docusate (Senokot-S) tablet 1 tablet  1 tablet Oral BID Aline August, MD   1 tablet at 04/04/21 2140  ? umeclidinium-vilanterol (ANORO ELLIPTA) 62.5-25 MCG/ACT 1 puff  1 puff Inhalation Daily Tu, Ching T, DO   1 puff at 04/05/21 8144  ? vitamin B-12 (CYANOCOBALAMIN) tablet 1,000 mcg  1,000 mcg Oral Daily Aline August, MD   1,000 mcg at 04/05/21 1343  ? ? ? ?Discharge Medications: ?Please see discharge summary for a  list of discharge medications. ? ?Relevant Imaging Results: ? ?Relevant Lab Results: ? ? ?Additional Information ?ss# [id number]; no covid vaccines on file ? ?Tawanna Cooler, RN ? ? ? ? ?

## 2021-04-06 DIAGNOSIS — E440 Moderate protein-calorie malnutrition: Secondary | ICD-10-CM

## 2021-04-06 NOTE — Care Management Important Message (Signed)
Important Message ? ?Patient Details IM Letter given to the Patient. ?Name: [name] ?MRN: [id number] ?Date of Birth: [birth year] ? ? ?Medicare Important Message Given:  Yes ? ? ? ? ?Kerin Salen ?04/06/2021, 10:13 AM ?

## 2021-04-06 NOTE — Progress Notes (Signed)
Upon entering the room RN found patient to already be in bathroom, after calling out for assistance. This is the second occasion of the patient turning her bed alarm off, and getting out of bed unsupervised. The patient has been educated on the importance of maintaining the siderails, and keeping the bed alarm turned on, for her safety. Bed alarm reset, fall mat at bedside.  ?

## 2021-04-06 NOTE — TOC Progression Note (Addendum)
Transition of Care (TOC) - Progression Note  ? ? ?Patient Details  ?Name: [name] ?MRN: [id number] ?Date of Birth: 11-07-1955 ? ?Transition of Care (TOC) CM/SW Contact  ?Jazmon Kos, Marjie Skiff, RN ?Phone Number: ?04/06/2021, 2:58 PM ? ?Clinical Narrative:    ? ?SNF bed offers provided to pt at bedside. She asked that I call her daughter with offers as well. [facility]) chosen. Liason at [facility] alerted of bed acceptance. ? ?[place] received: [id number] ? ?Insurance auth for SNF received from [place] [id number]  ?3/27-3/29. ? ?Pt declined substance abuse resources. ? ?Expected Discharge Plan: [place] ?Barriers to Discharge: Continued Medical Work up ? ?Expected Discharge Plan and Services ?Expected Discharge Plan: [place] ?  ?  ?  ?Living arrangements for the past 2 months: [place] ?                 ?  ?Readmission Risk Interventions ? ?  04/05/2021  ?  5:04 PM 11/13/2020  ? 10:26 AM 11/10/2020  ? 11:54 AM  ?Readmission Risk Prevention Plan  ?Transportation Screening Complete Complete Complete  ?PCP or Specialist Appt within 5-7 Days   Complete  ?PCP or Specialist Appt within 3-5 Days  Complete   ?Home Care Screening   Complete  ?Medication Review (RN CM)   Complete  ?[place] or Home Care Consult  Complete   ?Social Work Consult for [place] Planning/Counseling  Complete   ?Palliative Care Screening  Complete   ?Medication Review Press photographer) Complete Complete   ?PCP or Specialist appointment within 3-5 days of discharge Complete    ?[place] or Home Care Consult Complete    ?SW Recovery Care/Counseling Consult Complete    ?Palliative Care Screening Complete    ?Skilled Nursing Facility Complete    ? ? ?

## 2021-04-06 NOTE — Progress Notes (Signed)
?PROGRESS NOTE ? ? ? ?[name]  ZDG:[id number] DOB: 07/19/1955 DOA: 04/02/2021 ?PCP: System, Provider Not In  ? ?Brief Narrative:  ?65 y.o. female with medical history significant of COPD, hypertension, CKD stage III, cervical stenosis presented with fall with worsening bilateral knee pain and left hand pain.  On presentation, MRI of brain was negative for acute stroke.  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  Neurosurgery evaluated the patient: Recommended that patient is not a good operative candidate given her history of noncompliance and cocaine use and recommended outpatient follow-up with Dr. Ronnald Ramp at discharge.  No c-collar recommended by neurosurgery.  PT recommended SNF placement. ? ?Assessment & Plan: ?  ?Spinal stenosis in cervical region ?-History of ACDF for cervical stenosis.  Follows up with Dr. Ronnald Ramp and has missed multiple appointments in the office ?-Neurosurgery evaluated the patient: Recommended that patient is not a good operative candidate given her history of noncompliance and cocaine use and recommended outpatient follow-up with Dr. Ronnald Ramp at discharge.  No c-collar recommended by neurosurgery. ?-PT recommended SNF placement. ?-Continue pain management and bowel regimen.   ? ?Contracture of joint, lower leg ?-Contracture of bilateral lower extremity.  Patient unable to fully extend both knees.  Suspect due to prolonged hospitalization in November and mostly bedbound and secondary lifestyle ?-Continue PT evaluation and follow-up. ? ?Contracture of left hand ?-Will need outpatient follow-up ? ?UTI: Present on admission ?-Follow cultures: Urine culture growing E. coli.  Currently on Rocephin.  DC  Rocephin after today's dose. ? ?Peripheral neuropathy ?-Continue home amitriptyline, gabapentin and Cymbalta ? ?Folate deficiency ?-Supplement orally ? ?Possible vitamin B12 deficiency ?-Vitamin B12 at the lower limit of normal.  Continue supplementation. ? ?Cocaine abuse ?-UDS positive for cocaine.  Counseled regarding abstinence.  TOC consult ? ?COPD ?-Continue inhaled regimen.  Currently stable ? ?Essential hypertension ?-Continue home amlodipine.  Monitor blood pressure ? ?Hyponatremia ?-Resolved.  No labs today. ? ?Moderate malnutrition ?-Follow nutrition recommendations ? ?DVT prophylaxis: SCDs.  Not on heparin/Lovenox because of allergies to heparin  ?code Status: Full ?Family Communication: None at bedside ?Disposition Plan: ?Status is:  inpatient because: Of severe pain.  Need for SNF placement.  Currently medically stable for discharge to SNF ? ? ?Consultants: Neurosurgery ? ?Procedures: None ? ?Antimicrobials: Rocephin from 04/02/2021 ? ? ?Subjective: ?Patient seen and examined at bedside.  Poor historian.  Still complains of pain in bilateral lower extremities and hands.  No overnight chest pain, fever, vomiting reported.   ?Objective: ?Vitals:  ? 04/05/21 0929 04/05/21 1408 04/05/21 2139 04/06/21 0527  ?BP:  128/71 120/76 101/74  ?Pulse:  71 69 71  ?Resp:  17  18  ?Temp:  98.3 ?F (36.8 ?C) (!) 97.3 ?F (36.3 ?C) 98.1 ?F (36.7 ?C)  ?TempSrc:  Oral Oral Oral  ?SpO2: 99% 96% 98% 98%  ? ? ?Intake/Output Summary (Last 24 hours) at 04/06/2021 0811 ?Last data filed at 04/06/2021 0500 ?Gross per 24 hour  ?Intake 480 ml  ?Output 450 ml  ?Net 30 ml  ? ? ?There were no vitals filed for this visit. ? ?Examination: ? ?General exam: No acute distress.  Currently on room air.  Looks chronically ill and deconditioned.    Awake, looks intermittently  anxious.  Poor historian. ?Respiratory system: Bilateral decreased breath sounds at bases with some crackles cardiovascular system: Rate controlled; S1-S2  heard ?gastrointestinal system: Abdomen is distended slightly; soft and nontender.  Bowel sounds are heard  ?extremities: Mild lower extremity edema present; no clubbing ? ?Data Reviewed: I have personally reviewed following labs and imaging studies ? ?CBC: ?Recent Labs  ?Lab 04/02/21 ?1115 04/02/21 ?1124 04/04/21 ?3903  ?WBC 5.5  --  5.7  ?NEUTROABS 2.1  --  2.0  ?HGB 12.6 13.6 11.5*  ?HCT 38.7 40.0 34.7*  ?MCV 93.5  --  92.3  ?PLT 292  --  283  ? ? ?Basic Metabolic Panel: ?Recent Labs  ?Lab 04/02/21 ?1115 04/02/21 ?1124 04/03/21 ?0458 04/04/21 ?0092  ?NA 134* 138 132* 135  ?K 4.5 4.2 5.1 4.0  ?CL 103 103 105 105  ?CO2 27  --  20* 26  ?GLUCOSE 103* 95 119* 96  ?BUN '16 15 23 23  '$ ?CREATININE 0.95 0.80 1.06* 0.88  ?CALCIUM 8.8*  --  9.0 8.5*  ?MG  --   --   --  1.8  ? ? ?GFR: ?CrCl cannot be calculated (Unknown ideal weight.). ?Liver Function Tests: ?Recent Labs  ?Lab 04/02/21 ?1115  ?AST 15  ?ALT 9  ?ALKPHOS 114  ?BILITOT 0.8  ?PROT 7.7  ?ALBUMIN 3.7  ? ? ?No results for input(s): LIPASE, AMYLASE in the last 168 hours. ?No results for input(s): AMMONIA in the last 168 hours. ?Coagulation Profile: ?Recent Labs  ?Lab 04/02/21 ?1115  ?INR 0.9  ? ? ?Cardiac Enzymes: ?No results for input(s): CKTOTAL, CKMB, CKMBINDEX, TROPONINI in the last 168 hours. ?BNP (last 3 results) ?No results for input(s): PROBNP in the last 8760 hours. ?HbA1C: ?No results for input(s): HGBA1C in the last 72 hours. ? ?CBG: ?No results for input(s): GLUCAP in the last 168 hours. ?Lipid Profile: ?No results for input(s): CHOL, HDL, LDLCALC, TRIG, CHOLHDL, LDLDIRECT in the last 72 hours. ?Thyroid Function Tests: ?No results for input(s): TSH, T4TOTAL, FREET4, T3FREE, THYROIDAB in the last 72 hours. ?Anemia Panel: ?No results for input(s): VITAMINB12, FOLATE, FERRITIN, TIBC, IRON, RETICCTPCT in the last 72 hours. ? ?Sepsis Labs: ?No results for input(s): PROCALCITON, LATICACIDVEN in the last 168 hours. ? ?Recent Results (from the past 240 hour(s))   ?Resp Panel by RT-PCR (Flu A&B, Covid) Nasopharyngeal Swab     Status: None  ? Collection Time: 04/02/21 11:14 AM  ? Specimen: Nasopharyngeal Swab; Nasopharyngeal(NP) swabs in vial transport medium  ?Result Value Ref Range Status  ? SARS Coronavirus 2 by RT PCR NEGATIVE NEGATIVE Final  ?  Comment: (NOTE) ?SARS-CoV-2 target nucleic acids are NOT DETECTED. ? ?The SARS-CoV-2 RNA is generally detectable in upper respiratory ?specimens during the acute phase of infection. The lowest ?concentration of SARS-CoV-2 viral copies this assay can detect is ?138 copies/mL. A negative result does not preclude SARS-Cov-2 ?infection and should not be used as the sole basis for treatment or ?other patient management decisions. A negative result may occur with  ?improper specimen collection/handling, submission of specimen other ?than nasopharyngeal swab, presence of viral mutation(s) within the ?areas targeted by this assay, and inadequate number of viral ?copies(<138 copies/mL). A negative result must be combined with ?clinical observations, patient history, and epidemiological ?information. The expected result is Negative. ? ?Fact Sheet for Patients:  ?EntrepreneurPulse.com.au ? ?Fact Sheet for Healthcare Providers:  ?IncredibleEmployment.be ? ?This test is no t yet approved or cleared by the [place] and  ?has been authorized for detection  and/or diagnosis of SARS-CoV-2 by ?FDA under an Emergency Use Authorization (EUA). This EUA will remain  ?in effect (meaning this test can be used) for the duration of the ?COVID-19 declaration under Section 564(b)(1) of the Act, 21 ?U.S.C.section 360bbb-3(b)(1), unless the authorization is terminated  ?or revoked sooner.  ? ? ?  ? Influenza A by PCR NEGATIVE NEGATIVE Final  ? Influenza B by PCR NEGATIVE NEGATIVE Final  ?  Comment: (NOTE) ?The Xpert Xpress SARS-CoV-2/FLU/RSV plus assay is intended as an aid ?in the diagnosis of influenza from  Nasopharyngeal swab specimens and ?should not be used as a sole basis for treatment. Nasal washings and ?aspirates are unacceptable for Xpert Xpress SARS-CoV-2/FLU/RSV ?testing. ? ?Fact Sheet for Patients: ?SignatureTicket.co.uk

## 2021-04-07 DIAGNOSIS — E440 Moderate protein-calorie malnutrition: Secondary | ICD-10-CM

## 2021-04-07 DIAGNOSIS — E538 Deficiency of other specified B group vitamins: Secondary | ICD-10-CM

## 2021-04-07 MED ORDER — DIPHENHYDRAMINE HCL 25 MG PO CAPS
25.0000 mg | ORAL_CAPSULE | Freq: Once | ORAL | Status: AC | PRN
  Administered 2021-04-07: 25 mg via ORAL
  Filled 2021-04-07: qty 1

## 2021-04-07 MED ORDER — LORATADINE 10 MG PO TABS
10.0000 mg | ORAL_TABLET | Freq: Every day | ORAL | Status: DC | PRN
  Administered 2021-04-07: 10 mg via ORAL
  Filled 2021-04-07: qty 1

## 2021-04-07 NOTE — Progress Notes (Signed)
Physical Therapy Treatment ?Patient Details ?Name: Delise Simenson ?MRN: [id number] ?DOB: 06/30/1955 ?Today's Date: 04/07/2021 ? ? ?History of Present Illness Pt admitted from home on 04/02/21 s/p falls and with c/o bil knee pain, R arm pain and L hand pain.  Pt dx with UTI and worsening cervical spinal stenosis.  Pt with hx of CKD, COPD, substance abuse (cocaine), RA, anterior cervical fusion and peripheral neuropathy ? ?  ?PT Comments  ? ? The  patient demonstrates  decreased safety, reaching for [place]  and door while standing too far away. Continue mobility,. Relates  neck pain limiting.   ?Recommendations for follow up therapy are one component of a multi-disciplinary discharge planning process, led by the attending physician.  Recommendations may be updated based on patient status, additional functional criteria and insurance authorization. ? ?Follow Up Recommendations ? Skilled nursing-short term rehab (<3 hours/day) ?  ?  ?Assistance Recommended at Discharge    ?Patient can return home with the following A little help with walking and/or transfers;Assistance with cooking/housework;Assist for transportation;Help with stairs or ramp for entrance ?  ?Equipment Recommendations ? None recommended by PT  ?  ?Recommendations for Other Services   ? ? ?  ?Precautions / Restrictions Precautions ?Precautions: Fall  ?  ? ?Mobility ? Bed Mobility ?Overal bed mobility: Needs Assistance ?Bed Mobility: Supine to Sit, Sit to Supine ?  ?  ?Supine to sit: Supervision ?Sit to supine: Supervision ?  ?General bed mobility comments: Pt supine<>sit unassisted ?  ? ?Transfers ?Overall transfer level: Needs assistance ?Equipment used: Rolling walker (2 wheels) ?Transfers: Sit to/from Stand ?Sit to Stand: Min guard ?  ?  ?  ?  ?  ?General transfer comment: Cues for hand placement and safety; performed from bed and toilet ?  ? ?Ambulation/Gait ?Ambulation/Gait assistance: Min guard ?Gait Distance (Feet): 20 Feet (x 2) ?Assistive device:  Rolling walker (2 wheels) ?Gait Pattern/deviations: Step-to pattern, Decreased stride length, Trunk flexed ?  ?  ?  ?General Gait Details: min A and safety cues using RW, reaching to open door. to BR ? ? ?Stairs ?  ?  ?  ?  ?  ? ? ?Wheelchair Mobility ?  ? ?Modified Rankin (Stroke Patients Only) ?  ? ? ?  ?Balance   ?  ?  ?  ?  ?Standing balance support: Bilateral upper extremity supported, No upper extremity supported, Reliant on assistive device for balance ?Standing balance-Leahy Scale: Fair ?Standing balance comment: RW to ambulate but could static stand for ADLs without support ?  ?  ?  ?  ?  ?  ?  ?  ?  ?  ?  ?  ? ?  ?Cognition Arousal/Alertness: Awake/alert ?  ?  ?  ?  ?  ?  ?  ?  ?  ?  ?  ?  ?  ?  ?  ?  ?  ?  ?  ?  ?  ? ?  ?Exercises   ? ?  ?General Comments   ?  ?  ? ?Pertinent Vitals/Pain Pain Assessment ?Pain Score: 9  ?Pain Location: neck and back ?Pain Descriptors / Indicators: Grimacing, Guarding, Spasm, Tightness ?Pain Intervention(s): Premedicated before session, Monitored during session, Limited activity within patient's tolerance  ? ? ?Home Living   ?  ?  ?  ?  ?  ?  ?  ?  ?  ?   ?  ?Prior Function    ?  ?  ?   ? ?PT  Goals (current goals can now be found in the care plan section) Progress towards PT goals: Progressing toward goals ? ?  ?Frequency ? ? ? Min 2X/week ? ? ? ?  ?PT Plan Current plan remains appropriate;Frequency needs to be updated  ? ? ?Co-evaluation   ?  ?  ?  ?  ? ?  ?AM-PAC PT "6 Clicks" Mobility   ?Outcome Measure ? Help needed turning from your back to your side while in a flat bed without using bedrails?: None ?Help needed moving from lying on your back to sitting on the side of a flat bed without using bedrails?: A Little ?Help needed moving to and from a bed to a chair (including a wheelchair)?: A Little ?Help needed standing up from a chair using your arms (e.g., wheelchair or bedside chair)?: A Little ?Help needed to walk in hospital room?: A Lot ?Help needed climbing 3-5  steps with a railing? : Total ?6 Click Score: 16 ? ?  ?End of Session   ?Activity Tolerance: Patient limited by pain ?Patient left: in bed;with call bell/phone within reach;with bed alarm set ?Nurse Communication: Mobility status ?PT Visit Diagnosis: Unsteadiness on feet (R26.81);Muscle weakness (generalized) (M62.81);History of falling (Z91.81);Difficulty in walking, not elsewhere classified (R26.2);Pain ?Pain - part of body: Hip;Knee ?  ? ? ?Time: 1201-1223 ?PT Time Calculation (min) (ACUTE ONLY): 22 min ? ?Charges:  $Gait Training: 8-22 mins          ?          ? ?Tresa Endo PT ?Acute Rehabilitation Services ?Pager [phone] ?Office [phone] ? ? ? ?Claretha Cooper ?04/07/2021, 1:47 PM ? ?

## 2021-04-07 NOTE — TOC Progression Note (Signed)
Transition of Care (TOC) - Progression Note  ? ? ?Patient Details  ?Name: [name] ?MRN: [id number] ?Date of Birth: 03-May-1955 ? ?Transition of Care (TOC) CM/SW Contact  ?Shakara Tweedy, Marjie Skiff, RN ?Phone Number: ?04/07/2021, 2:15 PM ? ?Clinical Narrative:    ? ?[facility] rescinded the SNF bed offer. Attempted to change SNF facility on the current SNF auth and was told that [facility] requires a whole new auth to be done if facility is changed. Submitted new auth for [facility] as that was the daughter's second choice. TOC will continue to follow along. ? ?Expected Discharge Plan: [place] ?Barriers to Discharge: Continued Medical Work up ? ?Expected Discharge Plan and Services ?Expected Discharge Plan: [place] ?  ?  ?  ?Living arrangements for the past 2 months: Single Family Home ?                ?  ? ?Readmission Risk Interventions ? ?  04/05/2021  ?  5:04 PM 11/13/2020  ? 10:26 AM 11/10/2020  ? 11:54 AM  ?Readmission Risk Prevention Plan  ?Transportation Screening Complete Complete Complete  ?PCP or Specialist Appt within 5-7 Days   Complete  ?PCP or Specialist Appt within 3-5 Days  Complete   ?Home Care Screening   Complete  ?Medication Review (RN CM)   Complete  ?[place] or Home Care Consult  Complete   ?Social Work Consult for [place] Planning/Counseling  Complete   ?Palliative Care Screening  Complete   ?Medication Review Press photographer) Complete Complete   ?PCP or Specialist appointment within 3-5 days of discharge Complete    ?[place] or Home Care Consult Complete    ?SW Recovery Care/Counseling Consult Complete    ?Palliative Care Screening Complete    ?Skilled Nursing Facility Complete    ? ? ?

## 2021-04-07 NOTE — Progress Notes (Signed)
?PROGRESS NOTE ? ? ? ?[name]  TKW:[id number] DOB: [birth year] DOA: 04/02/2021 ?PCP: System, Provider Not In  ? ?Brief Narrative:  ?66 y.o. female with medical history significant of COPD, hypertension, CKD stage III, cervical stenosis presented with fall with worsening bilateral knee pain and left hand pain.  On presentation, MRI of brain was negative for acute stroke.  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  Neurosurgery evaluated the patient: Recommended that patient is not a good operative candidate given her history of noncompliance and cocaine use and recommended outpatient follow-up with Dr. Ronnald Ramp at discharge.  No c-collar recommended by neurosurgery.  PT recommended SNF placement. ? ?Assessment & Plan: ?  ?Spinal stenosis in cervical region ?-History of ACDF for cervical stenosis.  Follows up with Dr. Ronnald Ramp and has missed multiple appointments in the office ?-Neurosurgery evaluated the patient: Recommended that patient is not a good operative candidate given her history of noncompliance and cocaine use and recommended outpatient follow-up with Dr. Ronnald Ramp at discharge.  No c-collar recommended by neurosurgery. ?-PT recommended SNF placement. ?-Continue pain management and bowel regimen.   ? ?Contracture of joint, lower leg ?-Contracture of bilateral lower extremity.  Patient unable to fully extend both knees.  Suspect due to prolonged hospitalization in November and mostly bedbound and secondary lifestyle ?-Continue PT evaluation and follow-up. ? ?Contracture of left hand ?-Will need outpatient follow-up ? ?UTI: Present on admission ?-Follow cultures: Urine culture grew E. coli.  Treated with 5 days of  Rocephin. ? ?Peripheral neuropathy ?-Continue home amitriptyline, gabapentin and Cymbalta ? ?Folate deficiency ?-Supplement orally ? ?Possible vitamin B12 deficiency ?-Vitamin B12 at the lower limit of normal.  Continue supplementation. ? ?Cocaine abuse ?-UDS positive for cocaine.  Counseled regarding abstinence.  TOC consult ? ?COPD ?-Continue inhaled regimen.  Currently stable ? ?Essential hypertension ?-Continue home amlodipine.  Monitor blood pressure ? ?Hyponatremia ?-Resolved.  No labs today. ? ?Moderate malnutrition ?-Follow nutrition recommendations ? ?DVT prophylaxis: SCDs.  Not on heparin/Lovenox because of allergies to heparin  ?code Status: Full ?Family Communication: None at bedside ?Disposition Plan: ?Status is:  inpatient because: Of need for SNF placement.  Currently medically stable for discharge to SNF ? ? ?Consultants: Neurosurgery ? ?Procedures: None ? ?Antimicrobials: Rocephin from 04/02/2021 ? ? ?Subjective: ?Patient seen and examined at bedside.  Poor historian.  No fever, vomiting, worsening shortness of breath reported.   ?Objective: ?Vitals:  ? 04/06/21 1313 04/06/21 2018 04/07/21 0431 04/07/21 9242  ?BP: 118/67 121/73 134/72   ?Pulse: 86 77 68   ?Resp:  16 16   ?Temp: 98.1 ?F (36.7 ?C) 98.7 ?F (37.1 ?C) 98.1 ?F (36.7 ?C)   ?TempSrc: Oral Oral Oral   ?SpO2: 97% 96% 95% 95%  ? ? ?Intake/Output Summary (Last 24 hours) at 04/07/2021 0945 ?Last data filed at 04/06/2021 1314 ?Gross per 24 hour  ?Intake 240 ml  ?Output 300 ml  ?Net -60 ml  ? ? ?There were no vitals filed for this visit. ? ?Examination: ? ?General exam: On room air.  No distress.  Looks chronically ill and deconditioned.    Awake, looks intermittently anxious.  Poor historian. ?Respiratory system: Decreased breath sounds at bases bilaterally, no wheezing cardiovascular system: S1-S2 heard; rate  controlled currently ?gastrointestinal system: Abdomen is mildly distended; soft and nontender.  Normal bowel sounds heard  ?extremities: No  cyanosis; trace lower extremity edema present. ? ?Data Reviewed: I have personally reviewed following labs and imaging studies ? ?CBC: ?Recent Labs  ?Lab 04/02/21 ?1115 04/02/21 ?1124 04/04/21 ?9147  ?WBC 5.5  --  5.7  ?NEUTROABS 2.1  --  2.0  ?HGB 12.6 13.6 11.5*  ?HCT 38.7 40.0 34.7*  ?MCV 93.5  --  92.3  ?PLT 292  --  283  ? ? ?Basic Metabolic Panel: ?Recent Labs  ?Lab 04/02/21 ?1115 04/02/21 ?1124 04/03/21 ?0458 04/04/21 ?8295  ?NA 134* 138 132* 135  ?K 4.5 4.2 5.1 4.0  ?CL 103 103 105 105  ?CO2 27  --  20* 26  ?GLUCOSE 103* 95 119* 96  ?BUN '16 15 23 23  '$ ?CREATININE 0.95 0.80 1.06* 0.88  ?CALCIUM 8.8*  --  9.0 8.5*  ?MG  --   --   --  1.8  ? ? ?GFR: ?CrCl cannot be calculated (Unknown ideal weight.). ?Liver Function Tests: ?Recent Labs  ?Lab 04/02/21 ?1115  ?AST 15  ?ALT 9  ?ALKPHOS 114  ?BILITOT 0.8  ?PROT 7.7  ?ALBUMIN 3.7  ? ? ?No results for input(s): LIPASE, AMYLASE in the last 168 hours. ?No results for input(s): AMMONIA in the last 168 hours. ?Coagulation Profile: ?Recent Labs  ?Lab 04/02/21 ?1115  ?INR 0.9  ? ? ?Cardiac Enzymes: ?No results for input(s): CKTOTAL, CKMB, CKMBINDEX, TROPONINI in the last 168 hours. ?BNP (last 3 results) ?No results for input(s): PROBNP in the last 8760 hours. ?HbA1C: ?No results for input(s): HGBA1C in the last 72 hours. ? ?CBG: ?No results for input(s): GLUCAP in the last 168 hours. ?Lipid Profile: ?No results for input(s): CHOL, HDL, LDLCALC, TRIG, CHOLHDL, LDLDIRECT in the last 72 hours. ?Thyroid Function Tests: ?No results for input(s): TSH, T4TOTAL, FREET4, T3FREE, THYROIDAB in the last 72 hours. ?Anemia Panel: ?No results for input(s): VITAMINB12, FOLATE, FERRITIN, TIBC, IRON, RETICCTPCT in the last 72 hours. ? ?Sepsis Labs: ?No results for input(s): PROCALCITON, LATICACIDVEN in the last 168 hours. ? ?Recent Results (from the past 240 hour(s))  ?Resp Panel by RT-PCR (Flu A&B, Covid) Nasopharyngeal Swab     Status: None  ? Collection Time: 04/02/21 11:14 AM  ?  Specimen: Nasopharyngeal Swab; Nasopharyngeal(NP) swabs in vial transport medium  ?Result Value Ref Range Status  ? SARS Coronavirus 2 by RT PCR NEGATIVE NEGATIVE Final  ?  Comment: (NOTE) ?SARS-CoV-2 target nucleic acids are NOT DETECTED. ? ?The SARS-CoV-2 RNA is generally detectable in upper respiratory ?specimens during the acute phase of infection. The lowest ?concentration of SARS-CoV-2 viral copies this assay can detect is ?138 copies/mL. A negative result does not preclude SARS-Cov-2 ?infection and should not be used as the sole basis for treatment or ?other patient management decisions. A negative result may occur with  ?improper specimen collection/handling, submission of specimen other ?than nasopharyngeal swab, presence of viral mutation(s) within the ?areas targeted by this assay, and inadequate number of viral ?copies(<138 copies/mL). A negative result must be combined with ?clinical observations, patient history, and epidemiological ?information. The expected result is Negative. ? ?Fact Sheet for Patients:  ?EntrepreneurPulse.com.au ? ?Fact Sheet for Healthcare Providers:  ?IncredibleEmployment.be ? ?This test is no t yet approved or cleared by the [place] FDA and  ?has been authorized for detection and/or diagnosis of SARS-CoV-2 by ?FDA under an Emergency Use Authorization (EUA). This EUA will remain  ?in effect (  meaning this test can be used) for the duration of the ?COVID-19 declaration under Section 564(b)(1) of the Act, 21 ?U.S.C.section 360bbb-3(b)(1), unless the authorization is terminated  ?or revoked sooner.  ? ? ?  ? Influenza A by PCR NEGATIVE NEGATIVE Final  ? Influenza B by PCR NEGATIVE NEGATIVE Final  ?  Comment: (NOTE) ?The Xpert Xpress SARS-CoV-2/FLU/RSV plus assay is intended as an aid ?in the diagnosis of influenza from Nasopharyngeal swab specimens and ?should not be used as a sole basis for treatment. Nasal washings and ?aspirates are  unacceptable for Xpert Xpress SARS-CoV-2/FLU/RSV ?testing. ? ?Fact Sheet for Patients: ?EntrepreneurPulse.com.au ? ?Fact Sheet for Healthcare Providers: ?IncredibleEmployment.be ? ?This tes

## 2021-04-08 MED ORDER — FOLIC ACID 1 MG PO TABS: 1.0000 mg | ORAL_TABLET | Freq: Every day | ORAL | Status: DC

## 2021-04-08 MED ORDER — OXYCODONE-ACETAMINOPHEN 10-325 MG PO TABS: 1.0000 | ORAL_TABLET | ORAL | 0 refills | Status: DC | PRN

## 2021-04-08 MED ORDER — CYANOCOBALAMIN 1000 MCG PO TABS: 1000.0000 ug | ORAL_TABLET | Freq: Every day | ORAL | Status: DC

## 2021-04-08 NOTE — Assessment & Plan Note (Signed)
Folic acid on discharge. ?

## 2021-04-08 NOTE — Hospital Course (Signed)
66 y.o. female with medical history significant of COPD, hypertension, CKD stage III, cervical stenosis presented with fall with worsening bilateral knee pain and left hand pain.  On presentation, MRI of brain was negative for acute stroke.  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  Neurosurgery evaluated the patient: Recommended that patient is not a good operative candidate given her history of noncompliance and cocaine use and recommended outpatient follow-up with Dr. Ronnald Ramp at discharge.  No c-collar recommended by neurosurgery.  PT recommended SNF placement. ?

## 2021-04-08 NOTE — Discharge Summary (Signed)
?Physician Discharge Summary ?  ?Patient: [name] MRN: [id number] DOB: 02/24/1955  ?Admit date:     04/02/2021  ?Discharge date: 04/08/21  ?Discharge Physician: Cordelia Poche, MD  ? ?PCP: System, Provider Not In  ? ?Recommendations at discharge:  ? ? Outpatient follow-up with PCP and neurosurgery ? ?Discharge Diagnoses: ?Principal Problem: ?  Spinal stenosis in cervical region ?Active Problems: ?  Contracture of joint, lower leg ?  Dupuytren's contracture of left hand ?  Essential hypertension ?  Cocaine abuse ([place]) ?  COPD (chronic obstructive pulmonary disease) ([place]) ?  Peripheral neuropathy ?  UTI (urinary tract infection) ?  Severe pain ?  Protein-calorie malnutrition, moderate ([place]) ?  B12 deficiency ?  Folate deficiency ? ?Resolved Problems: ?  * No resolved hospital problems. * ? ?Hospital Course: ?66 y.o. female with medical history significant of COPD, hypertension, CKD stage III, cervical stenosis presented with fall with worsening bilateral knee pain and left hand pain.  On presentation, MRI of brain was negative for acute stroke.  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  Neurosurgery evaluated the patient: Recommended that patient is not a good operative candidate given her history of noncompliance and cocaine use and recommended outpatient follow-up with Dr. Ronnald Ramp at discharge.  No c-collar recommended by neurosurgery.  PT recommended SNF placement. ? ?Assessment and Plan: ?* Spinal stenosis in cervical region ?Hx of ACDF C5/6,6/7 in 04/27/21 for cervical stenosis in past. Worsening cervical stenosis seen on MRI imaging. Neurosurgery consulted and recommended no surgical management at this time secondary to  comorbidities and instead recommend outpatient follow-up with outpatient neurosurgeon, Dr. Ronnald Ramp. Patient with resultant L>R weakness in addition to extremity numbness. PT recommending SNF. Continue outpatient pain regimen. ? ?Contracture of joint, lower leg ?Lower extremity. Patient unable to fully extend knees. Suspected secondary to prolonged hospitalization. Patient to continue PT as an outpatient. ? ?Dupuytren's contracture of left hand ?Third digit. Outpatient follow-up. ? ?Folate deficiency ?Folic acid on discharge. ? ?B12 deficiency ?Vitamin B12 on discharge. ? ?UTI (urinary tract infection) ?Patient completed Ceftriaxone IV x5 days. ? ?Peripheral neuropathy ?Continue home amitriptyline, gabapentin and Cymbalta ? ?COPD (chronic obstructive pulmonary disease) ([place]) ?Continue home bronchodilator ? ?Cocaine abuse ([place]) ?UDS is positive for cocaine ? ?Essential hypertension ?Continue home amlodipine ? ? ? ? ?  ? ? ?Consultants: Neurosurgery ?Procedures performed: None  ?Disposition: Skilled nursing facility ?Diet recommendation:  ?Regular diet ? ?DISCHARGE MEDICATION: ?Allergies as of 04/08/2021   ? ?   Reactions  ? Iohexol Anaphylaxis  ? Iodine-131 Other (See Comments)  ? Neck tightens up  ? Levofloxacin Other (See Comments)  ? BASELINE PROLONGED QTc.    ? Zofran [ondansetron Hcl] Other (See Comments)  ? BASELINE PROLONGED QTc.  ? Heparin Itching, Other (See Comments)  ? Pt is able to tolerate IV heparin, but not sub-Q.  ? Morphine And Related Itching  ? ?  ? ?  ?Medication List  ?  ? ?TAKE these medications   ? ?albuterol 108 (90 Base) MCG/ACT inhaler ?Commonly known as: VENTOLIN HFA ?Inhale 1-2 puffs into the lungs every 6 (six) hours as needed for wheezing or shortness of breath. ?  ?amitriptyline 50 MG tablet ?Commonly known as: ELAVIL ?Take  50 mg by mouth at bedtime. ?  ?amLODipine 10 MG tablet ?Commonly known as: NORVASC ?Take 10 mg by mouth every morning. ?  ?atorvastatin 40 MG tablet ?Commonly known as:  LIPITOR ?Take 1 tablet (40 mg total) by mouth daily at 6 PM. ?What changed: when to take this ?  ?cetirizine 10 MG tablet ?Commonly known as: ZYRTEC ?Take 10 mg by mouth daily as needed for allergies. ?  ?cyanocobalamin 1000 MCG tablet ?Take 1 tablet (1,000 mcg total) by mouth daily. ?Start taking on: April 09, 2021 ?  ?diclofenac Sodium 1 % Gel ?Commonly known as: VOLTAREN ?Apply 2 g topically 4 (four) times daily as needed (joint pain). ?  ?DULoxetine 30 MG capsule ?Commonly known as: CYMBALTA ?Take 30 mg by mouth daily. ?  ?folic acid 1 MG tablet ?Commonly known as: FOLVITE ?Take 1 tablet (1 mg total) by mouth daily. ?Start taking on: April 09, 2021 ?  ?gabapentin 400 MG capsule ?Commonly known as: NEURONTIN ?Take 400-800 mg by mouth at bedtime. ?  ?linaclotide 145 MCG Caps capsule ?Commonly known as: LINZESS ?Take 145 mcg by mouth daily before breakfast. ?  ?methocarbamol 500 MG tablet ?Commonly known as: ROBAXIN ?Take 500 mg by mouth every 6 (six) hours as needed for muscle spasms. ?  ?oxyCODONE-acetaminophen 10-325 MG tablet ?Commonly known as: PERCOCET ?Take 1 tablet by mouth every 4 (four) hours as needed for pain. ?  ?pantoprazole 40 MG tablet ?Commonly known as: PROTONIX ?Take 1 tablet (40 mg total) by mouth 2 (two) times daily before a meal. ?What changed: when to take this ?  ?polyethylene glycol 17 g packet ?Commonly known as: MIRALAX / GLYCOLAX ?Take 17 g by mouth daily. ?What changed:  ?when to take this ?reasons to take this ?  ?umeclidinium-vilanterol 62.5-25 MCG/ACT Aepb ?Commonly known as: ANORO ELLIPTA ?Inhale 1 puff into the lungs daily. ?  ? ?  ? ? Follow-up Information   ? ? Jettie Booze, MD. Schedule an appointment as soon as possible for a visit.   ?Specialties: Cardiology, Radiology, Interventional Cardiology ?Why: For hospital follow-up ?Contact information: ?[address] ?[address] ?[place] [address] ?[phone] ? ? ?  ?  ? ? [name], MD Follow up in 1 week(s).    ?Specialty: Neurosurgery ?Why: Severe spinal stenosis ?Contact information: ?[address] ?[address] ?[place] [address] ?[phone] ? ? ?  ?  ? ?  ?  ? ?  ? ?Discharge Exam: ? ?BP 127/73 (BP Location: Right Arm)   Pulse 77   Temp 98.1 ?F (36.7 ?C) (Oral)   Resp 14   SpO2 93%   ? ?General: Well appearing, no distress ? ?Condition at discharge: stable ? ?The results of significant diagnostics from this hospitalization (including imaging, microbiology, ancillary and laboratory) are listed below for reference.  ? ?Imaging Studies: ?CT Soft Tissue Neck Wo Contrast ? ?Result Date: 04/02/2021 ?CLINICAL DATA:  Parotid region mass.  Neck pain. EXAM: CT NECK WITHOUT CONTRAST TECHNIQUE: Multidetector CT imaging of the neck was performed following the standard protocol without intravenous contrast. RADIATION DOSE REDUCTION: This exam was performed according to the departmental dose-optimization program which includes automated exposure control, adjustment of the mA and/or kV according to patient size and/or use of iterative reconstruction technique. COMPARISON:  Cervical and brain MRI same day. Maxillofacial CT 06/12/2020. cervical MRI 05/12/2015. CT neck 09/15/2018. FINDINGS: Pharynx and larynx: No mucosal or submucosal mass lesion. Salivary glands: Submandibular glands are normal. No right parotid abnormality  visible without contrast. Within the left parotid gland, there is subtle evidence of a 19 mm parotid mass, better shown on prior studies listed above. Both of these were present in 2017 and show slow enlargement. Right mass measured 8 mm at that time. Left mass measured 12 mm at that time. 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 Thyroid: Normal Lymph nodes: No lymphadenopathy on either side of the neck. Vascular: Atherosclerotic calcification at the carotid bifurcations. Limited intracranial: Normal Visualized orbits: Normal Mastoids and  visualized paranasal sinuses: Clear Skeleton: Previous ACDF C5 through C7. Degenerative spondylosis C2-3, C3-4, C4-5 and C7-T1. Upper chest: Aortic atherosclerosis. Emphysema. Pulmonary scarring. Other: None

## 2021-04-08 NOTE — TOC Progression Note (Addendum)
Transition of Care (TOC) - Progression Note  ? ? ?Patient Details  ?Name: [name] ?MRN: [id number] ?Date of Birth: November 16, 1955 ? ?Transition of Care (TOC) CM/SW Contact  ?Jamicia Haaland, Marjie Skiff, RN ?Phone Number: ?04/08/2021, 10:50 AM ? ?Clinical Narrative:    ?Insurance auth received for [facility] 3/29-3/31 [id number]. [facility] has already done admission paperwork with daughter. Pt can admit to them today. MD made aware. ? ?Pt to dc to [facility] room 3221. She will go via [facility]. RN to call report to [phone]. ? ?Expected Discharge Plan: [place] ?Barriers to Discharge: Continued Medical Work up ? ?Expected Discharge Plan and Services ?Expected Discharge Plan: [place] ?  ?  ?  ?Living arrangements for the past 2 months: [place] ?                ?  ?Readmission Risk Interventions ? ?  04/05/2021  ?  5:04 PM 11/13/2020  ? 10:26 AM 11/10/2020  ? 11:54 AM  ?Readmission Risk Prevention Plan  ?Transportation Screening Complete Complete Complete  ?PCP or Specialist Appt within 5-7 Days   Complete  ?PCP or Specialist Appt within 3-5 Days  Complete   ?Home Care Screening   Complete  ?Medication Review (RN CM)   Complete  ?[place] or Home Care Consult  Complete   ?Social Work Consult for [place] Planning/Counseling  Complete   ?Palliative Care Screening  Complete   ?Medication Review Press photographer) Complete Complete   ?PCP or Specialist appointment within 3-5 days of discharge Complete    ?[place] or Home Care Consult Complete    ?SW Recovery Care/Counseling Consult Complete    ?Palliative Care Screening Complete    ?Skilled Nursing Facility Complete    ? ? ?

## 2021-04-08 NOTE — Assessment & Plan Note (Signed)
Vitamin B12 on discharge. ?

## 2021-04-13 ENCOUNTER — Emergency Department (HOSPITAL_COMMUNITY)
Admission: EM | Admit: 2021-04-13 | Discharge: 2021-04-14 | Disposition: A | Discharge status: Home / Self Care | Payer: Medicare Other | Attending: Emergency Medicine | Admitting: Emergency Medicine

## 2021-04-13 ENCOUNTER — Other Ambulatory Visit: Payer: Self-pay

## 2021-04-13 ENCOUNTER — Inpatient Hospital Stay: Admission: RE | Admit: 2021-04-13 | Payer: Medicare Other | Source: Ambulatory Visit

## 2021-04-13 DIAGNOSIS — J45909 Unspecified asthma, uncomplicated: Secondary | ICD-10-CM | POA: Insufficient documentation

## 2021-04-13 DIAGNOSIS — D179 Benign lipomatous neoplasm, unspecified: Secondary | ICD-10-CM

## 2021-04-13 DIAGNOSIS — M5412 Radiculopathy, cervical region: Secondary | ICD-10-CM | POA: Insufficient documentation

## 2021-04-13 DIAGNOSIS — F17210 Nicotine dependence, cigarettes, uncomplicated: Secondary | ICD-10-CM | POA: Diagnosis not present

## 2021-04-13 DIAGNOSIS — J449 Chronic obstructive pulmonary disease, unspecified: Secondary | ICD-10-CM | POA: Diagnosis not present

## 2021-04-13 DIAGNOSIS — M542 Cervicalgia: Secondary | ICD-10-CM | POA: Diagnosis present

## 2021-04-13 DIAGNOSIS — I129 Hypertensive chronic kidney disease with stage 1 through stage 4 chronic kidney disease, or unspecified chronic kidney disease: Secondary | ICD-10-CM | POA: Diagnosis not present

## 2021-04-13 DIAGNOSIS — N183 Chronic kidney disease, stage 3 unspecified: Secondary | ICD-10-CM | POA: Insufficient documentation

## 2021-04-13 MED ORDER — OXYCODONE-ACETAMINOPHEN 5-325 MG PO TABS
2.0000 | ORAL_TABLET | Freq: Once | ORAL | Status: AC
  Administered 2021-04-14: 2 via ORAL
  Filled 2021-04-13: qty 2

## 2021-04-13 NOTE — ED Triage Notes (Signed)
BIB EMS from [facility] for bilateral hand and Lt arm pain, had stroke a week ago and has some Lt arm deficit, no injury ?

## 2021-04-13 NOTE — Discharge Instructions (Addendum)
Your pain is likely due to the known degenerative changes in your cervical spine.  You have been on longstanding pain management and you will need to have your narcotic prescriptions prescribed by your pain management physician.  It is not appropriate for chronic pain to be managed from the emergency department. ?
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
## 2021-04-14 DIAGNOSIS — M5412 Radiculopathy, cervical region: Secondary | ICD-10-CM | POA: Diagnosis not present

## 2021-04-14 NOTE — ED Notes (Signed)
[facility] called and arrangements made for transport ?

## 2021-04-22 ENCOUNTER — Emergency Department (HOSPITAL_COMMUNITY)
Admission: EM | Admit: 2021-04-22 | Discharge: 2021-04-22 | Disposition: A | Discharge status: Home / Self Care | Payer: Medicare Other | Attending: Emergency Medicine | Admitting: Emergency Medicine

## 2021-04-22 ENCOUNTER — Encounter (HOSPITAL_COMMUNITY): Payer: Self-pay | Admitting: Emergency Medicine

## 2021-04-22 DIAGNOSIS — Z5321 Procedure and treatment not carried out due to patient leaving prior to being seen by health care provider: Secondary | ICD-10-CM | POA: Diagnosis not present

## 2021-04-22 DIAGNOSIS — R52 Pain, unspecified: Secondary | ICD-10-CM | POA: Insufficient documentation

## 2021-04-22 NOTE — ED Notes (Signed)
Patient called for room. No answer. ?

## 2021-04-22 NOTE — ED Triage Notes (Signed)
Per EMS-coming from home-complaining of pain all over since last week-did not take any meds today-requesting pain meds from EMS and triage ?

## 2021-05-06 ENCOUNTER — Ambulatory Visit: Payer: Medicare Other | Admitting: Interventional Cardiology

## 2021-06-01 ENCOUNTER — Emergency Department (HOSPITAL_COMMUNITY): Payer: Medicare Other

## 2021-06-01 ENCOUNTER — Encounter (HOSPITAL_COMMUNITY): Payer: Self-pay | Admitting: Emergency Medicine

## 2021-06-01 ENCOUNTER — Emergency Department (HOSPITAL_COMMUNITY)
Admission: EM | Admit: 2021-06-01 | Discharge: 2021-06-01 | Disposition: A | Discharge status: Home / Self Care | Payer: Medicare Other | Attending: Emergency Medicine | Admitting: Emergency Medicine

## 2021-06-01 DIAGNOSIS — F1110 Opioid abuse, uncomplicated: Secondary | ICD-10-CM | POA: Diagnosis not present

## 2021-06-01 DIAGNOSIS — F1410 Cocaine abuse, uncomplicated: Secondary | ICD-10-CM | POA: Diagnosis not present

## 2021-06-01 DIAGNOSIS — R930 Abnormal findings on diagnostic imaging of skull and head, not elsewhere classified: Secondary | ICD-10-CM | POA: Diagnosis not present

## 2021-06-01 DIAGNOSIS — F1910 Other psychoactive substance abuse, uncomplicated: Secondary | ICD-10-CM

## 2021-06-01 DIAGNOSIS — Z79899 Other long term (current) drug therapy: Secondary | ICD-10-CM | POA: Insufficient documentation

## 2021-06-01 DIAGNOSIS — N3000 Acute cystitis without hematuria: Secondary | ICD-10-CM | POA: Insufficient documentation

## 2021-06-01 DIAGNOSIS — Y93E2 Activity, laundry: Secondary | ICD-10-CM | POA: Insufficient documentation

## 2021-06-01 DIAGNOSIS — S63502A Unspecified sprain of left wrist, initial encounter: Secondary | ICD-10-CM | POA: Diagnosis not present

## 2021-06-01 DIAGNOSIS — W19XXXA Unspecified fall, initial encounter: Secondary | ICD-10-CM | POA: Diagnosis not present

## 2021-06-01 DIAGNOSIS — R079 Chest pain, unspecified: Secondary | ICD-10-CM | POA: Diagnosis not present

## 2021-06-01 DIAGNOSIS — S6992XA Unspecified injury of left wrist, hand and finger(s), initial encounter: Secondary | ICD-10-CM | POA: Diagnosis present

## 2021-06-01 LAB — CBC WITH DIFFERENTIAL/PLATELET
Abs Immature Granulocytes: 0.03 10*3/uL (ref 0.00–0.07)
Basophils Absolute: 0 10*3/uL (ref 0.0–0.1)
Basophils Relative: 0 %
Eosinophils Absolute: 0.1 10*3/uL (ref 0.0–0.5)
Eosinophils Relative: 1 %
HCT: 35.5 % — ABNORMAL LOW (ref 36.0–46.0)
Hemoglobin: 11.8 g/dL — ABNORMAL LOW (ref 12.0–15.0)
Immature Granulocytes: 0 %
Lymphocytes Relative: 15 %
Lymphs Abs: 1.6 10*3/uL (ref 0.7–4.0)
MCH: 30.3 pg (ref 26.0–34.0)
MCHC: 33.2 g/dL (ref 30.0–36.0)
MCV: 91.3 fL (ref 80.0–100.0)
Monocytes Absolute: 0.5 10*3/uL (ref 0.1–1.0)
Monocytes Relative: 5 %
Neutro Abs: 8.8 10*3/uL — ABNORMAL HIGH (ref 1.7–7.7)
Neutrophils Relative %: 79 %
Platelets: 306 10*3/uL (ref 150–400)
RBC: 3.89 MIL/uL (ref 3.87–5.11)
RDW: 14.2 % (ref 11.5–15.5)
WBC: 11 10*3/uL — ABNORMAL HIGH (ref 4.0–10.5)
nRBC: 0 % (ref 0.0–0.2)

## 2021-06-01 LAB — URINALYSIS, ROUTINE W REFLEX MICROSCOPIC
Bilirubin Urine: NEGATIVE
Glucose, UA: NEGATIVE mg/dL
Hgb urine dipstick: NEGATIVE
Ketones, ur: NEGATIVE mg/dL
Nitrite: POSITIVE — AB
Protein, ur: 30 mg/dL — AB
Specific Gravity, Urine: 1.02 (ref 1.005–1.030)
pH: 5 (ref 5.0–8.0)

## 2021-06-01 LAB — COMPREHENSIVE METABOLIC PANEL
ALT: 10 U/L (ref 0–44)
AST: 14 U/L — ABNORMAL LOW (ref 15–41)
Albumin: 3.5 g/dL (ref 3.5–5.0)
Alkaline Phosphatase: 95 U/L (ref 38–126)
Anion gap: 8 (ref 5–15)
BUN: 21 mg/dL (ref 8–23)
CO2: 21 mmol/L — ABNORMAL LOW (ref 22–32)
Calcium: 8.4 mg/dL — ABNORMAL LOW (ref 8.9–10.3)
Chloride: 108 mmol/L (ref 98–111)
Creatinine, Ser: 1.14 mg/dL — ABNORMAL HIGH (ref 0.44–1.00)
GFR, Estimated: 53 mL/min — ABNORMAL LOW (ref >60)
Glucose, Bld: 106 mg/dL — ABNORMAL HIGH (ref 70–99)
Potassium: 3.9 mmol/L (ref 3.5–5.1)
Sodium: 137 mmol/L (ref 135–145)
Total Bilirubin: 0.7 mg/dL (ref 0.3–1.2)
Total Protein: 7.3 g/dL (ref 6.5–8.1)

## 2021-06-01 LAB — RAPID URINE DRUG SCREEN, HOSP PERFORMED
Amphetamines: NOT DETECTED
Barbiturates: NOT DETECTED
Benzodiazepines: NOT DETECTED
Cocaine: POSITIVE — AB
Opiates: POSITIVE — AB
Tetrahydrocannabinol: NOT DETECTED

## 2021-06-01 LAB — TROPONIN I (HIGH SENSITIVITY): Troponin I (High Sensitivity): 10 ng/L (ref <18)

## 2021-06-01 LAB — ETHANOL: Alcohol, Ethyl (B): <10 mg/dL (ref <10)

## 2021-06-01 IMAGING — CR DG CERVICAL SPINE 2 OR 3 VIEWS
5 series · 5 of 5 positions shown · non-contrast
Comparison: 03/08/2017

CLINICAL DATA: Chronic cervical pain for 1 year worsened in past 2
months, no known injury, BILATERAL pain radiating to tops of
shoulders greater on LEFT

EXAM:
CERVICAL SPINE - 2-3 VIEW

[w cervical spine lat]
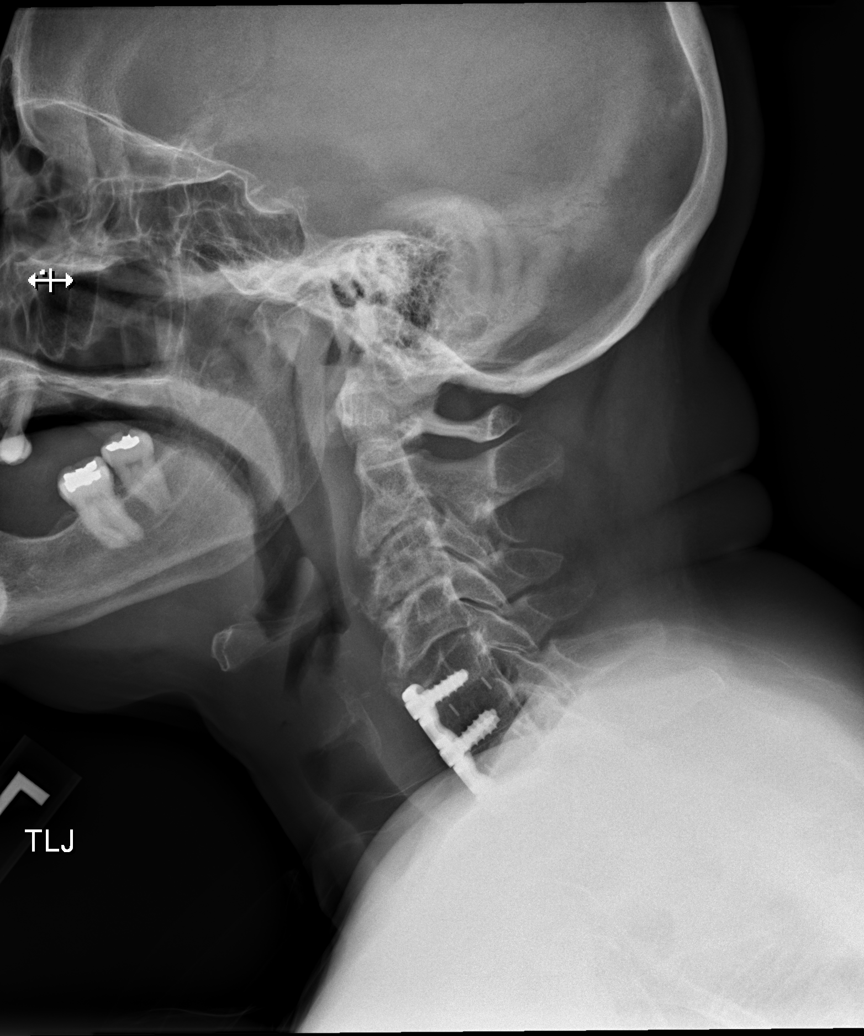

[w cervical swimmers]
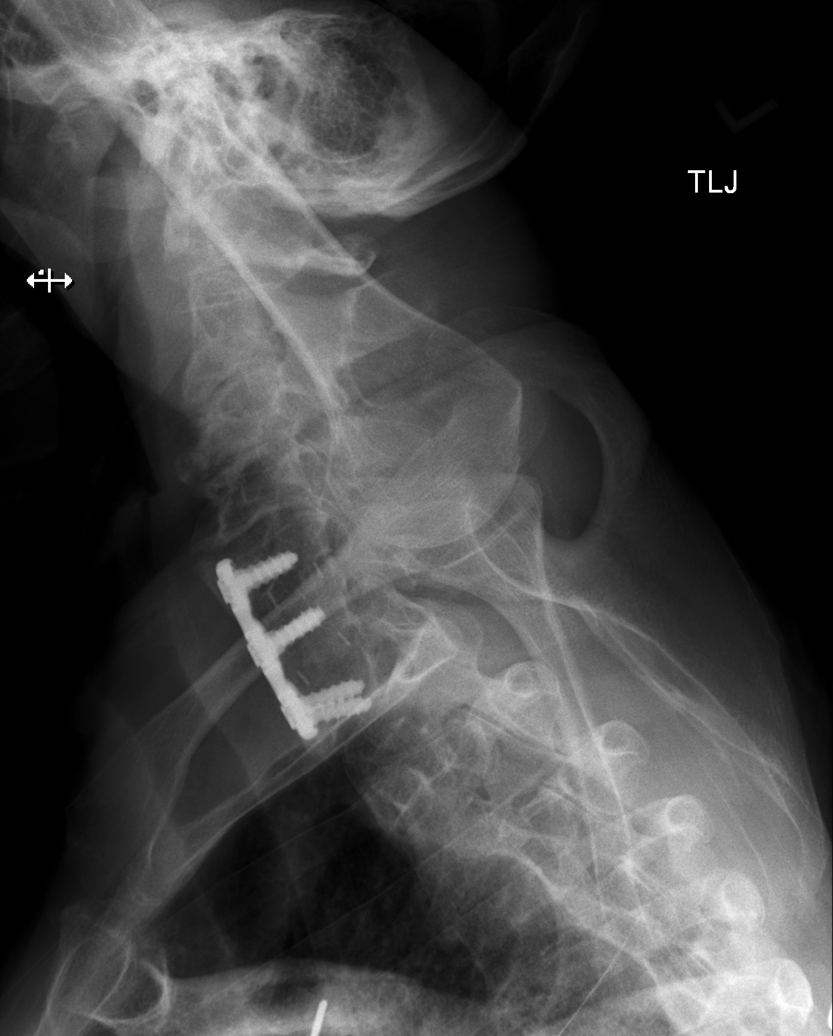

[w cervical spine ap]
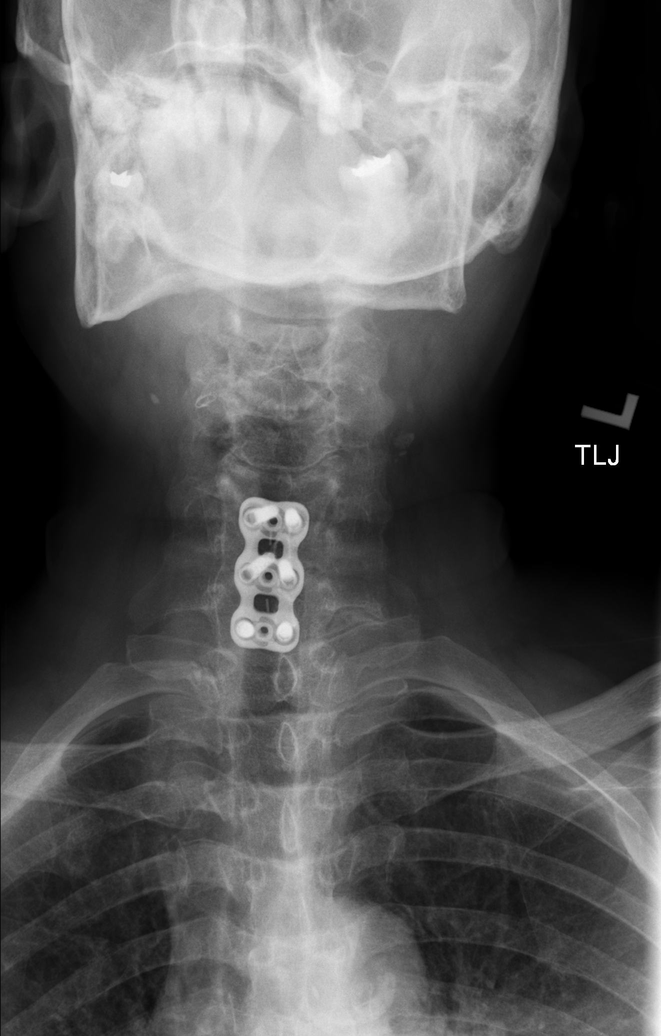

[w cervical spine odontoid (1 of 2)]
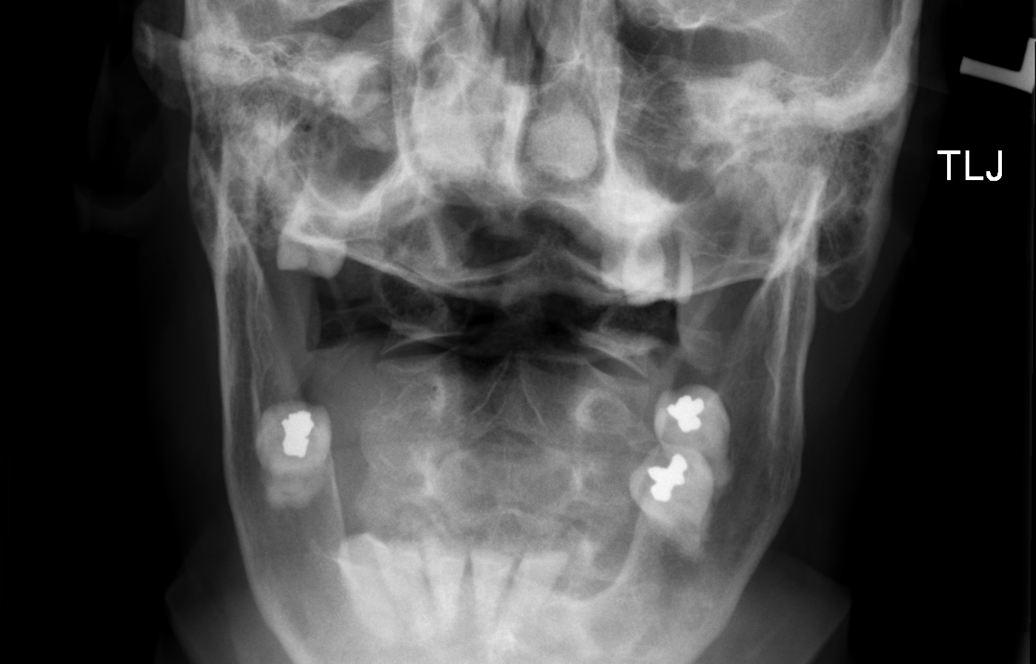

[w cervical spine odontoid (2 of 2)]
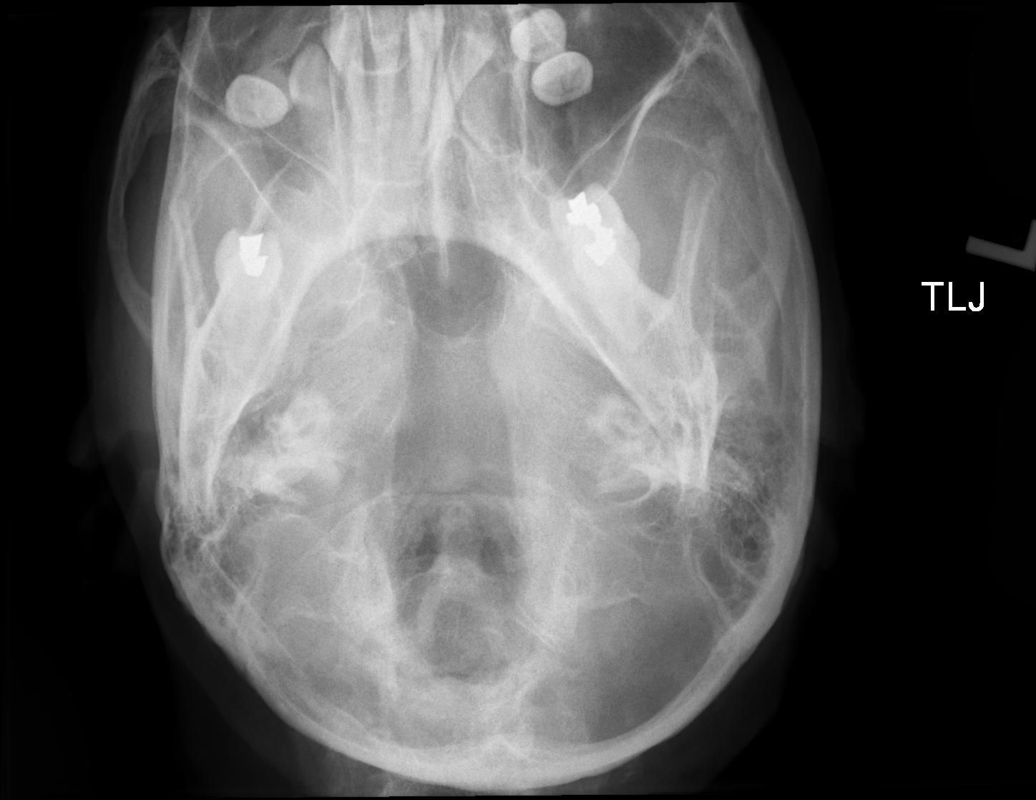

[5 of 5 positions shown; findings below may reference images not displayed]

FINDINGS: Osseous demineralization.

Anterior plate and screws C5-C7 with incorporated disc prostheses.

Disc space narrowing and endplate spur formation at C2-C3 and C3-C4.

Vertebral body heights maintained.

No fracture, subluxation, or bone destruction.

C1-C2 alignment normal.

Prevertebral soft tissues normal thickness.

Lung apices clear.

Atherosclerotic calcifications aortic arch and minimally in the
carotid systems.
IMPRESSION: Prior anterior fusion C5-C7.

Degenerative disc disease changes at C2-C3 and C3-C4.

No acute osseous abnormalities.

Aortic Atherosclerosis ([id number]).

## 2021-06-01 MED ORDER — NALOXONE HCL 2 MG/2ML IJ SOSY: 1.0000 mg | PREFILLED_SYRINGE | Freq: Once | INTRAMUSCULAR | Status: DC

## 2021-06-01 MED ORDER — CEPHALEXIN 500 MG PO CAPS: 500.0000 mg | ORAL_CAPSULE | Freq: Four times a day (QID) | ORAL | 0 refills | Status: DC

## 2021-06-01 MED ORDER — SODIUM CHLORIDE 0.9 % IV SOLN
1.0000 g | Freq: Once | INTRAVENOUS | Status: AC
  Administered 2021-06-01: 1 g via INTRAVENOUS
  Filled 2021-06-01: qty 10

## 2021-06-01 MED ORDER — NALOXONE HCL 0.4 MG/ML IJ SOLN
0.4000 mg | Freq: Once | INTRAMUSCULAR | Status: AC
  Administered 2021-06-01: 0.4 mg via INTRAVENOUS
  Filled 2021-06-01: qty 1

## 2021-06-01 NOTE — ED Notes (Signed)
Patient repositioned in bed and brief reapplied. HOB elevated and patient covered with warm blankets. Call bell within reach.

## 2021-06-01 NOTE — ED Notes (Signed)
Pt in CT SCAN 

## 2021-06-01 NOTE — ED Notes (Signed)
Paper dressed in paper scrubs for discharge.

## 2021-06-01 NOTE — ED Provider Notes (Addendum)
[facility] DEPT Provider Note   CSN: [id number] Arrival date & time: 06/01/21  0940     History  Chief Complaint  Patient presents with   [name]    [name] is a 66 y.o. female.  Patient is a 66 year old female who presents after reported fall.  Per chart review, she has a history of chronic pain and severe spinal stenosis in her cervical spine.  She also has a history of substance abuse and during her recent admission tested positive for cocaine.  She was seen by neurosurgery and deemed a poor surgical candidate.  She was supposed to follow-up as an outpatient with Dr. Ronnald Ramp.  She is living in an independent living facility.  She reportedly had a fall there.  However she is very sleepy and uncooperative on my exam and its difficult to get a complete story.  She said that she was hiding from some other people in the house and was sleeping in the laundry room.  However she does report that she has had a recent fall and has pain in her left wrist related to that.  She cannot really tell me about the fall or what happened.  She does complain of some pain in her left chest.  She cannot really tell me if she hurt that during the fall.  She denies any ingestions other than her normal home medications.  History is limited due to her sleepiness and uncooperative state.      Home Medications Prior to Admission medications   Medication Sig Start Date End Date Taking? Authorizing Provider  albuterol (VENTOLIN HFA) 108 (90 Base) MCG/ACT inhaler Inhale 1-2 puffs into the lungs every 6 (six) hours as needed for wheezing or shortness of breath. 11/24/20   Kathie Dike, MD  amitriptyline (ELAVIL) 50 MG tablet Take 50 mg by mouth at bedtime.    [provider]  amLODipine (NORVASC) 10 MG tablet Take 10 mg by mouth every morning. 03/12/19   [provider]  atorvastatin (LIPITOR) 40 MG tablet Take 1 tablet (40 mg total) by mouth daily at 6 PM. Patient  taking differently: Take 40 mg by mouth at bedtime. 08/22/14   Rai, Vernelle Emerald, MD  baclofen (LIORESAL) 10 MG tablet Take 10 mg by mouth 3 (three) times daily as needed. 05/30/21   [provider]  cetirizine (ZYRTEC) 10 MG tablet Take 10 mg by mouth daily as needed for allergies. 01/16/21   [provider]  diclofenac Sodium (VOLTAREN) 1 % GEL Apply 2 g topically 4 (four) times daily as needed (joint pain). 03/12/19   [provider]  DULoxetine (CYMBALTA) 30 MG capsule Take 30 mg by mouth daily. 03/05/21   [provider]  folic acid (FOLVITE) 1 MG tablet Take 1 tablet (1 mg total) by mouth daily. 04/09/21   Mariel Aloe, MD  gabapentin (NEURONTIN) 400 MG capsule Take 400-800 mg by mouth at bedtime. 03/26/21   [provider]  linaclotide (LINZESS) 145 MCG CAPS capsule Take 145 mcg by mouth daily before breakfast.    [provider]  methocarbamol (ROBAXIN) 500 MG tablet Take 500 mg by mouth every 6 (six) hours as needed for muscle spasms. 03/05/21   [provider]  oxyCODONE-acetaminophen (PERCOCET) 10-325 MG tablet Take 1 tablet by mouth every 4 (four) hours as needed for pain. 04/08/21   Mariel Aloe, MD  pantoprazole (PROTONIX) 40 MG tablet Take 1 tablet (40 mg total) by mouth 2 (two) times  daily before a meal. Patient taking differently: Take 40 mg by mouth daily. 06/29/18   Dessa Phi, DO  polyethylene glycol (MIRALAX / GLYCOLAX) 17 g packet Take 17 g by mouth daily. Patient taking differently: Take 17 g by mouth daily as needed for mild constipation. 11/25/20   Kathie Dike, MD  umeclidinium-vilanterol (ANORO ELLIPTA) 62.5-25 MCG/ACT AEPB Inhale 1 puff into the lungs daily. 11/25/20   Kathie Dike, MD  vitamin B-12 1000 MCG tablet Take 1 tablet (1,000 mcg total) by mouth daily. 04/09/21   Mariel Aloe, MD      Allergies    Iohexol, Iodine-131, Levofloxacin, Zofran [ondansetron hcl], Heparin, and Morphine and related     Review of Systems   Review of Systems  Unable to perform ROS: Mental status change   Physical Exam Updated Vital Signs BP 128/87 (BP Location: Right Arm)   Pulse 96   Temp 98.1 F (36.7 C)   Resp 14   SpO2 98%  Physical Exam Constitutional:      Appearance: Normal appearance. She is ill-appearing.     Comments: Sleepy but becomes agitated at times  HENT:     Head: Normocephalic and atraumatic.     Mouth/Throat:     Mouth: Mucous membranes are moist.  Eyes:     Extraocular Movements: Extraocular movements intact.     Pupils: Pupils are equal, round, and reactive to light.  Neck:     Comments: She does have some tenderness along her cervical spine, unclear if this is new or chronic.  No pain to the thoracic or lumbosacral spine Cardiovascular:     Rate and Rhythm: Normal rate.     Heart sounds: No murmur heard. Pulmonary:     Effort: Pulmonary effort is normal.     Breath sounds: No wheezing, rhonchi or rales.  Chest:     Chest wall: Tenderness (Positive tenderness to the left upper chest, no crepitus or deformity) present.  Abdominal:     General: Abdomen is flat. There is no distension.     Tenderness: There is no abdominal tenderness.  Musculoskeletal:     Comments: There is some swelling over the dorsum of the left wrist.  There is tenderness to this area.  No pain to the elbow or shoulder.  Pedal pulses are intact.  She seems to have normal sensation and motor function distally.  Neurological:     Mental Status: She is alert.     Comments: Patient is answering questions appropriately when she is awake.  She does not seem to have any slurred speech when she is fully awake.  She seems to be moving all extremities symmetrically without obvious focal deficits.    ED Results / Procedures / Treatments   Labs (all labs ordered are listed, but only abnormal results are displayed) Labs Reviewed  CBC WITH DIFFERENTIAL/PLATELET - Abnormal; Notable for the following  components:      Result Value   WBC 11.0 (*)    Hemoglobin 11.8 (*)    HCT 35.5 (*)    Neutro Abs 8.8 (*)    All other components within normal limits  COMPREHENSIVE METABOLIC PANEL - Abnormal; Notable for the following components:   CO2 21 (*)    Glucose, Bld 106 (*)    Creatinine, Ser 1.14 (*)    Calcium 8.4 (*)    AST 14 (*)    GFR, Estimated 53 (*)    All other components within normal limits  RAPID URINE DRUG  SCREEN, HOSP PERFORMED - Abnormal; Notable for the following components:   Opiates POSITIVE (*)    Cocaine POSITIVE (*)    All other components within normal limits  URINALYSIS, ROUTINE W REFLEX MICROSCOPIC - Abnormal; Notable for the following components:   APPearance HAZY (*)    Protein, ur 30 (*)    Nitrite POSITIVE (*)    Leukocytes,Ua MODERATE (*)    Bacteria, UA MANY (*)    All other components within normal limits  ETHANOL  I-STAT CHEM 8, ED  TROPONIN I (HIGH SENSITIVITY)    EKG EKG Interpretation  Date/Time:  Monday Jun 01 2021 10:19:46 EDT Ventricular Rate:  91 PR Interval:  128 QRS Duration: 80 QT Interval:  433 QTC Calculation: 533 R Axis:   6 Text Interpretation: Sinus rhythm Probable anteroseptal infarct, old Nonspecific T abnormalities, lateral leads Prolonged QT interval Confirmed by Malvin Johns [phone]) on 06/01/2021 11:32:40 AM  Radiology DG Chest 2 View  Result Date: 06/01/2021 CLINICAL DATA:  Fall, chest pain EXAM: CHEST - 2 VIEW COMPARISON:  01/09/2021 FINDINGS: Normal mediastinum and cardiac silhouette. No effusion, infiltrate or pneumothorax. Anterior cervical fusion. IMPRESSION: No evidence of thoracic trauma. Electronically Signed   By: Suzy Bouchard M.D.   On: 06/01/2021 11:12   DG Wrist Complete Left  Result Date: 06/01/2021 CLINICAL DATA:  Fall, pain EXAM: LEFT WRIST - COMPLETE 3+ VIEW COMPARISON:  None Available. FINDINGS: There is no evidence of fracture or dislocation. There is no evidence of arthropathy or other focal bone  abnormality. Soft tissues are unremarkable. IMPRESSION: No fracture or dislocation of the left wrist. The carpus is normally aligned. Electronically Signed   By: Delanna Ahmadi M.D.   On: 06/01/2021 11:12   CT Head Wo Contrast  Result Date: 06/01/2021 CLINICAL DATA:  66 year old female with head and neck injury from fall. Initial encounter. EXAM: CT HEAD WITHOUT CONTRAST CT CERVICAL SPINE WITHOUT CONTRAST TECHNIQUE: Multidetector CT imaging of the head and cervical spine was performed following the standard protocol without intravenous contrast. Multiplanar CT image reconstructions of the cervical spine were also generated. RADIATION DOSE REDUCTION: This exam was performed according to the departmental dose-optimization program which includes automated exposure control, adjustment of the mA and/or kV according to patient size and/or use of iterative reconstruction technique. COMPARISON:  04/02/2021 and prior studies. FINDINGS: CT HEAD FINDINGS Brain: No evidence of acute infarction, hemorrhage, hydrocephalus, extra-axial collection or mass lesion/mass effect. Atrophy and chronic small-vessel ischemic changes again noted. Vascular: Carotid atherosclerotic calcifications are noted. Skull: Normal. Negative for fracture or focal lesion. Sinuses/Orbits: No acute finding. 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 CT CERVICAL SPINE FINDINGS The patient would not remain still and there is extensive motion artifact significantly limiting evaluation despite rescanning. No gross acute abnormalities noted. Fusion changes from C5-C7 again noted. IMPRESSION: 1. No evidence of acute intracranial abnormality. Atrophy and chronic small-vessel ischemic changes. 2. Motion artifact significantly limiting evaluation of the cervical spine despite rescanning. No gross acute abnormalities identified. Consider sedation and rescanning as clinically indicated. 3. 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 Electronically Signed   By:  Margarette Canada M.D.   On: 06/01/2021 11:34   CT Cervical Spine Wo Contrast  Result Date: 06/01/2021 CLINICAL DATA:  66 year old female with head and neck injury from fall. Initial encounter. EXAM: CT HEAD WITHOUT CONTRAST CT CERVICAL SPINE WITHOUT CONTRAST TECHNIQUE: Multidetector CT imaging of the head and cervical spine was performed following the standard protocol without intravenous contrast. Multiplanar  CT image reconstructions of the cervical spine were also generated. RADIATION DOSE REDUCTION: This exam was performed according to the departmental dose-optimization program which includes automated exposure control, adjustment of the mA and/or kV according to patient size and/or use of iterative reconstruction technique. COMPARISON:  04/02/2021 and prior studies. FINDINGS: CT HEAD FINDINGS Brain: No evidence of acute infarction, hemorrhage, hydrocephalus, extra-axial collection or mass lesion/mass effect. Atrophy and chronic small-vessel ischemic changes again noted. Vascular: Carotid atherosclerotic calcifications are noted. Skull: Normal. Negative for fracture or focal lesion. Sinuses/Orbits: No acute finding. 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 CT CERVICAL SPINE FINDINGS The patient would not remain still and there is extensive motion artifact significantly limiting evaluation despite rescanning. No gross acute abnormalities noted. Fusion changes from C5-C7 again noted. IMPRESSION: 1. No evidence of acute intracranial abnormality. Atrophy and chronic small-vessel ischemic changes. 2. Motion artifact significantly limiting evaluation of the cervical spine despite rescanning. No gross acute abnormalities identified. Consider sedation and rescanning as clinically indicated. 3. 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 Electronically Signed   By: Margarette Canada M.D.   On: 06/01/2021 11:34    Procedures Procedures    Medications Ordered in ED Medications  cefTRIAXone (ROCEPHIN) 1 g in sodium  chloride 0.9 % 100 mL IVPB (0 g Intravenous Stopped 06/01/21 1430)    ED Course/ Medical Decision Making/ A&P                           Medical Decision Making Amount and/or Complexity of Data Reviewed Labs: ordered. Radiology: ordered.   Patient is a 66 year old female who presents after reported fall.  She cannot really tell me much about the fall and actually said that she did not have a fall today but cannot tell me when she had a fall.  She had this in her left wrist.  X-rays were obtained which shows no acute fracture.  We will place in a wrist splint.  She had a CT scan of her head and cervical spine which showed no acute abnormalities although the cervical spine was somewhat limited due to her motion.  She seems to be moving all extremities symmetrically without concerns for stroke.  She does not have a history of substance abuse and her UDS is positive for cocaine and opioids.  She is still not back to what I presume to be her baseline mental status.  She is very sleepy but when she wakes up she will speak normally for short period time and then go back to sleep.  Will need further period of observation.  Once back to baseline, anticipate discharge.  Care turned over to Dr. Roderic Palau pending this.  Her urine is consistent with infection.  She was given IV Rocephin.  Her last cultures were reviewed. Final Clinical Impression(s) / ED Diagnoses Final diagnoses:  Substance abuse ([place])  Sprain of left wrist, initial encounter  Acute cystitis without hematuria    Rx / DC Orders ED Discharge Orders     None         Malvin Johns, MD 06/01/21 1659    Malvin Johns, MD 06/01/21 1700

## 2021-06-01 NOTE — ED Notes (Signed)
Spoke with patient's daughter, Hyla, who reports she will come to get patient at this time.

## 2021-06-01 NOTE — ED Notes (Signed)
ED Provider at bedside. 

## 2021-06-01 NOTE — ED Notes (Signed)
Pt was place on bedpan to void and urine was obtain

## 2021-06-01 NOTE — ED Notes (Signed)
Patient continues to loudly cry stating she is in pain all over. Patient falls asleep during conversation. When patient is redirected she will answer questions appropriately. MD aware.

## 2021-06-01 NOTE — ED Notes (Signed)
Patient transported to CT 

## 2021-06-01 NOTE — ED Notes (Signed)
Spoke with patient's daughter who reports she will come to get patient at time of discharge. [phone]

## 2021-06-01 NOTE — ED Notes (Signed)
Patient more alert after narcan administration. Called out that she needed to use the bathroom. Assisted patient to use bedpan. Changed brief and incontinence pad on bed.

## 2021-06-01 NOTE — ED Notes (Addendum)
Patient assisted on bedpan after stating she needed to use the bathroom. Patient fell asleep sitting up on bedpan.

## 2021-06-01 NOTE — ED Notes (Signed)
Attempted to reposition patient in bed. Patient states "I am more comfortable this way."

## 2021-06-01 NOTE — ED Notes (Signed)
Patient resting comfortably with eyes closed. Equal chest rise and fall noted. 

## 2021-06-01 NOTE — Discharge Instructions (Signed)
Stop taking drugs that are not prescribed to you.  Follow-up with your doctor within a week for your bladder infection

## 2021-06-01 NOTE — ED Notes (Signed)
Attempted to call patient's daughter with patient permission without answer.

## 2021-06-01 NOTE — ED Triage Notes (Addendum)
Per EMS, patient from home, fall in the laundry room of independent living, initially uncooperative with EMS. L shoulder pain with swelling to L hand.  Hx chronic pain, substance abuse.  BP 142/90 HR 98 CBG 124  Patient reports chronic pain at baseline in her knees, shoulders, and neck.

## 2021-06-01 NOTE — ED Notes (Signed)
Daughter en route to get patient.

## 2021-07-02 ENCOUNTER — Emergency Department (HOSPITAL_COMMUNITY): Payer: Medicare Other

## 2021-07-02 ENCOUNTER — Other Ambulatory Visit: Payer: Self-pay

## 2021-07-02 ENCOUNTER — Emergency Department (HOSPITAL_COMMUNITY)
Admission: EM | Admit: 2021-07-02 | Discharge: 2021-07-02 | Disposition: A | Discharge status: Home / Self Care | Payer: Medicare Other | Source: Home / Self Care | Attending: Emergency Medicine | Admitting: Emergency Medicine

## 2021-07-02 ENCOUNTER — Encounter (HOSPITAL_COMMUNITY): Payer: Self-pay | Admitting: Emergency Medicine

## 2021-07-02 ENCOUNTER — Emergency Department (HOSPITAL_COMMUNITY)
Admission: EM | Admit: 2021-07-02 | Discharge: 2021-07-02 | Disposition: A | Discharge status: Home / Self Care | Payer: Medicare Other | Attending: Emergency Medicine | Admitting: Emergency Medicine

## 2021-07-02 ENCOUNTER — Encounter (HOSPITAL_COMMUNITY): Payer: Self-pay

## 2021-07-02 DIAGNOSIS — W06XXXA Fall from bed, initial encounter: Secondary | ICD-10-CM | POA: Diagnosis not present

## 2021-07-02 DIAGNOSIS — R531 Weakness: Secondary | ICD-10-CM | POA: Insufficient documentation

## 2021-07-02 DIAGNOSIS — M549 Dorsalgia, unspecified: Secondary | ICD-10-CM | POA: Diagnosis not present

## 2021-07-02 DIAGNOSIS — Z79899 Other long term (current) drug therapy: Secondary | ICD-10-CM | POA: Diagnosis not present

## 2021-07-02 DIAGNOSIS — S0990XA Unspecified injury of head, initial encounter: Secondary | ICD-10-CM | POA: Insufficient documentation

## 2021-07-02 DIAGNOSIS — Y92009 Unspecified place in unspecified non-institutional (private) residence as the place of occurrence of the external cause: Secondary | ICD-10-CM | POA: Diagnosis not present

## 2021-07-02 DIAGNOSIS — M542 Cervicalgia: Secondary | ICD-10-CM | POA: Insufficient documentation

## 2021-07-02 DIAGNOSIS — M50021 Cervical disc disorder at C4-C5 level with myelopathy: Secondary | ICD-10-CM | POA: Insufficient documentation

## 2021-07-02 DIAGNOSIS — G959 Disease of spinal cord, unspecified: Secondary | ICD-10-CM

## 2021-07-02 DIAGNOSIS — M25561 Pain in right knee: Secondary | ICD-10-CM | POA: Insufficient documentation

## 2021-07-02 DIAGNOSIS — W19XXXA Unspecified fall, initial encounter: Secondary | ICD-10-CM

## 2021-07-02 LAB — RAPID URINE DRUG SCREEN, HOSP PERFORMED
Amphetamines: NOT DETECTED
Barbiturates: NOT DETECTED
Benzodiazepines: NOT DETECTED
Cocaine: NOT DETECTED
Opiates: NOT DETECTED
Tetrahydrocannabinol: NOT DETECTED

## 2021-07-02 LAB — BASIC METABOLIC PANEL
Anion gap: 5 (ref 5–15)
BUN: 12 mg/dL (ref 8–23)
CO2: 26 mmol/L (ref 22–32)
Calcium: 8.5 mg/dL — ABNORMAL LOW (ref 8.9–10.3)
Chloride: 109 mmol/L (ref 98–111)
Creatinine, Ser: 0.82 mg/dL (ref 0.44–1.00)
GFR, Estimated: >60 mL/min (ref >60)
Glucose, Bld: 93 mg/dL (ref 70–99)
Potassium: 4.2 mmol/L (ref 3.5–5.1)
Sodium: 140 mmol/L (ref 135–145)

## 2021-07-02 LAB — CBC WITH DIFFERENTIAL/PLATELET
Abs Immature Granulocytes: 0.02 10*3/uL (ref 0.00–0.07)
Basophils Absolute: 0 10*3/uL (ref 0.0–0.1)
Basophils Relative: 0 %
Eosinophils Absolute: 0.3 10*3/uL (ref 0.0–0.5)
Eosinophils Relative: 4 %
HCT: 36.1 % (ref 36.0–46.0)
Hemoglobin: 11.5 g/dL — ABNORMAL LOW (ref 12.0–15.0)
Immature Granulocytes: 0 %
Lymphocytes Relative: 37 %
Lymphs Abs: 2.8 10*3/uL (ref 0.7–4.0)
MCH: 29.7 pg (ref 26.0–34.0)
MCHC: 31.9 g/dL (ref 30.0–36.0)
MCV: 93.3 fL (ref 80.0–100.0)
Monocytes Absolute: 0.6 10*3/uL (ref 0.1–1.0)
Monocytes Relative: 8 %
Neutro Abs: 3.7 10*3/uL (ref 1.7–7.7)
Neutrophils Relative %: 51 %
Platelets: 270 10*3/uL (ref 150–400)
RBC: 3.87 MIL/uL (ref 3.87–5.11)
RDW: 14.6 % (ref 11.5–15.5)
WBC: 7.4 10*3/uL (ref 4.0–10.5)
nRBC: 0 % (ref 0.0–0.2)

## 2021-07-02 MED ORDER — GABAPENTIN 300 MG PO CAPS
400.0000 mg | ORAL_CAPSULE | Freq: Once | ORAL | Status: AC
  Administered 2021-07-02: 400 mg via ORAL
  Filled 2021-07-02: qty 1

## 2021-07-02 MED ORDER — OXYCODONE HCL 5 MG PO TABS: 5.0000 mg | ORAL_TABLET | ORAL | 0 refills | Status: DC | PRN

## 2021-07-02 MED ORDER — HYDROMORPHONE HCL 2 MG PO TABS
2.0000 mg | ORAL_TABLET | Freq: Once | ORAL | Status: AC
  Administered 2021-07-02: 2 mg via ORAL
  Filled 2021-07-02: qty 1

## 2021-07-02 MED ORDER — LIDOCAINE 5 % EX PTCH: 1.0000 | MEDICATED_PATCH | Freq: Every day | CUTANEOUS | 0 refills | Status: DC | PRN

## 2021-07-02 MED ORDER — LIDOCAINE 5 % EX PTCH
1.0000 | MEDICATED_PATCH | CUTANEOUS | Status: DC
  Administered 2021-07-02: 1 via TRANSDERMAL
  Filled 2021-07-02: qty 1

## 2021-07-02 MED ORDER — DEXAMETHASONE SODIUM PHOSPHATE 10 MG/ML IJ SOLN
8.0000 mg | Freq: Once | INTRAMUSCULAR | Status: AC
  Administered 2021-07-02: 8 mg via INTRAMUSCULAR
  Filled 2021-07-02: qty 1

## 2021-07-02 MED ORDER — OXYCODONE-ACETAMINOPHEN 5-325 MG PO TABS
1.0000 | ORAL_TABLET | Freq: Once | ORAL | Status: AC
  Administered 2021-07-02: 1 via ORAL
  Filled 2021-07-02: qty 1

## 2021-07-02 NOTE — Discharge Instructions (Addendum)
Please keep these papers and bring them with you for your future doctors appointments.  You had CT scans of your head and upper neck done in the [facility] long emergency department this morning.  You also had an MRI of your cervical spine or upper neck.  There were no signs of brain bleeds or broken bones.  Your MRI does show that you continue to have pressure on your cervical spinal cord, which is a chronic finding.  Your neurosurgeon is hoping to do surgery, but you need to be cleared by an ENT doctor for anesthesia beforehand.  In the emergency room your neurological exam was reassuring. We gave you a shot of steroids in the emergency room which may help with the pain in your neck.  You already prescribed opiates for pain at home.  You should return to the emergency room if you begin having weakness in your arms or legs, such as loss of feeling in your arms or legs, difficulty picking up your arms, or any other new and worsening weakness symptoms.  Please follow up with Mike Gip your NEUROSURGEON for your neck pain.    [facility]   [address], [place] [address]   [phone]   [address], [address], [place] [address]   [phone] (Work)   [phone] (Fax)

## 2021-07-02 NOTE — Discharge Instructions (Addendum)
It was a pleasure caring for you today in the emergency department.  Please return to the emergency department for any worsening or worrisome symptoms.   Emergency care providers appreciate that many patients coming to [place] are in severe pain and we wish to address their pain in the safest, most responsible manner.  It is important to recognize, however, that the proper treatment of chronic pain differs from that of the pain of injuries and acute illnesses.  Our goal is to provider quality, safe, personalized care and we thank you for giving [place] the opportunity to serve you.  The use of narcotics and related agents for chronic pain syndromes may lead to additional physical and psychological problems.  Nearly as many people die from prescription narcotics each year as die from car crashes.  Additionally, this risk is increased if such prescriptions are obtained from a variety of sources.  Therefore, only your primary care physician or a pain management specialist is able to safely treat such syndromes with narcotic medications long-term.  Documentation revealing such prescriptions have been sought from multiple sources may prohibit [place] from providing a refill or different narcotic medication.  Your name may be checked first through the [place].  This database is a record of controlled substance medication prescriptions that the patient has received.  This has been established by [facility] in an effort to eliminate the dangerous, and often life-threatening, practice of obtaining multiple prescriptions from different medical providers.  If you have a chronic pain syndrome (i.e. chronic headaches, recurrent back or neck pain, dental pain, abdominal or pelvic pain without a specific diagnosis, or neuropathic pain such as fibromyalgia) or recurrent visits for the same condition without an acute diagnosis, you may be treated with non-narcotics and other  non-addictive medicines.  Allergic reactions or negative side effects that may be reported by a patient to such medications will not typically lead to the use of a narcotic analgesic or other controlled substance as an alternative.  Patients managing chronic pain with a personal physician should have provisions in place for breakthrough pain.  If you are in crisis, you should call your physician.  If your physician directs you to the emergency department, please have the doctor call and speak to our attending physician concerning your care.  When patients come to the Emergency Department (ED) with acute medical conditions in which the ED physician feels it is appropriate to prescribe narcotic or sedating pain medication, the physician will prescribe these in very limited quantities.  The amount of these medications will last only until you can see your primary care physician in his/her office.  Any patient who returns to the ED seeking refills should expect only non-narcotic pain medications.  In the event an acute medical condition exists and the emergency physician feels it is necessary that the patient be given a narcotic or sedating medication, a responsible adult driver should be present in the room prior to the medication being given by the nurse.  Prescriptions for narcotic or sedating medications that have been lost, stolen, or expired will NOT be refilled in the ED.  Patients who have chronic pain may receive non-narcotic prescriptions until seen by their primary care physician.  It is every patient's personal responsibility to maintain active prescriptions with his or her primary care physician or specialist.

## 2021-07-02 NOTE — ED Triage Notes (Addendum)
Pt BIB EMS with reports of a fall 45 mins ago from her bed hitting her head. No obvious deformities. Pt complains of neck pain and back pain. Pt has multiple complaints. Pt reports that she does not do cocaine.

## 2021-07-02 NOTE — ED Notes (Signed)
Attempted to ambulate patient, Pt did attempt but was unable to stand up properly even with assistance.

## 2021-07-02 NOTE — ED Notes (Signed)
Pt calls family to pick up pt.

## 2021-07-02 NOTE — ED Notes (Signed)
Pt verbalized understanding of d/c instructions, meds, and followup care. Denies questions. VSS, no distress noted. Steady gait to exit with all belongings.  ?

## 2021-07-02 NOTE — ED Provider Triage Note (Cosign Needed)
Emergency Medicine Provider Triage Evaluation Note  [name] , a 66 y.o. female  was evaluated in triage.  Pt complains of head and neck pain.  Patient was seen earlier today at [facility] after a fall.  She had CT head, CT C-spine, and MRI of C-spine and was discharged.  She states that [place] [facility] told her to go to [facility] today, who then sent her to the emergency department again.  She describes chronic type of pain that shoots across the back of her head and into her neck.  Review of Systems  Positive: Head pain, neck pain Negative: Fever  Physical Exam  BP (!) 141/82 (BP Location: Right Arm)   Pulse 78   Temp 97.9 F (36.6 C) (Oral)   Resp (!) 22   SpO2 95%  Gen:   Awake, no distress   Resp:  Normal effort  MSK:   Moves extremities without difficulty  Other:    Medical Decision Making  Medically screening exam initiated at 2:55 PM.  Appropriate orders placed.  [name] was informed that the remainder of the evaluation will be completed by another provider, this initial triage assessment does not replace that evaluation, and the importance of remaining in the ED until their evaluation is complete.     Carlisle Cater, PA-C 07/02/21 1459

## 2021-07-02 NOTE — ED Provider Notes (Signed)
Blood pressure (!) 141/105, pulse 66, temperature 98 F (36.7 C), temperature source Oral, resp. rate 16, SpO2 96 %.  Assuming care from Dr. Ralene Bathe.  In short, [name] is a 66 y.o. female with a chief complaint of Fall, Neck Pain, and Back Pain .  Refer to the original H&P for additional details.  The current plan of care is to follow up MRI.   66 yo female Hx polysubstance abuse, chronic back pain Follows with NSGY [facility] (eventual C3-C4 discetomy/fusion) Had a fall while getting out of bed, exacerbation of her chronic pain Imaging so far negative, CT head/spine, CXR, Knee XR MRI c-spine is pending MRI has resulted, no acute cord injury-reviewed images, agree with radiologist rotation  Advised patient follow-up with her PCP regarding abnormal chest x-ray.  She has no respiratory complaints.  She is breathing comfortably ambient air.  No cough.  Follow-up with neurosurgery regarding chronic neck pain.  The patient improved significantly and was discharged in stable condition. Detailed discussions were had with the patient regarding current findings, and need for close f/u with PCP or on call doctor. The patient has been instructed to return immediately if the symptoms worsen in any way for re-evaluation. Patient verbalized understanding and is in agreement with current care plan. All questions answered prior to discharge.        Jeanell Sparrow, DO 07/02/21 [phone]

## 2021-07-02 NOTE — ED Provider Notes (Incomplete)
[facility] DEPARTMENT Provider Note   CSN: [id number] Arrival date & time: 07/02/21  1453     History {Add pertinent medical, surgical, social history, OB history to HPI:1} Chief Complaint  Patient presents with   [name] is a 66 y.o. female present emerged department complaint of neck pain.  Hx of C5-C6 ACDF.  Patient is an extremely poor historian.  The patient was seen in [facility] earlier this morning, and late yesterday evening, after a fall, complaining of neck pain and pain that radiated to the back of her neck.  She had a CT of the head and CT of the cervical spine, followed by an MRI of the cervical spine, subsequently was discharged with instructions for neurosurgery follow-up.  She already has pending appointments with neurosurgeons and is planning for possible surgery for degenerative disc disease of the cervical spine.  She presents back to the ED today reporting that she was told to come to the hospital again by the office that she went to.  She cannot tell to me which office she went to, which doctor she spoke to, or why they told her to come back to the hospital.  I do not see any record of this in our system.  She continues to have pain that radiates across her bilateral shoulders, also shoots up the back of her neck into the back of her head.  She is upset because she has not been able to eat anything in 24 hours and has not taken any of her pain medications.  MRI C spine today showing: IMPRESSION: Multilevel degenerative changes as detailed above similar to the prior study. As before, there is marked canal stenosis at C2-C3 and C3-C4 with cord compression at C3-C4. Persistent abnormal signal at C3-C4 reflecting edema or myelomalacia. Foraminal narrowing remains greatest at C4-C5.   No evidence of ligamentous or soft tissue injury.  Per my review of the reports of the CT cervical spine and CT head, there is no  evidence of acute cranial injury or acute cervical fracture.  MRI noted that the vertebral arteries were unremarkable.  HPI     Home Medications Prior to Admission medications   Medication Sig Start Date End Date Taking? Authorizing Provider  albuterol (VENTOLIN HFA) 108 (90 Base) MCG/ACT inhaler Inhale 1-2 puffs into the lungs every 6 (six) hours as needed for wheezing or shortness of breath. 11/24/20   Kathie Dike, MD  amitriptyline (ELAVIL) 50 MG tablet Take 50 mg by mouth at bedtime.    [provider]  amLODipine (NORVASC) 10 MG tablet Take 10 mg by mouth every morning. 03/12/19   [provider]  atorvastatin (LIPITOR) 40 MG tablet Take 1 tablet (40 mg total) by mouth daily at 6 PM. Patient taking differently: Take 40 mg by mouth at bedtime. 08/22/14   Rai, Vernelle Emerald, MD  baclofen (LIORESAL) 10 MG tablet Take 10 mg by mouth 3 (three) times daily as needed for muscle spasms. 05/30/21   [provider]  cephALEXin (KEFLEX) 500 MG capsule Take 1 capsule (500 mg total) by mouth 4 (four) times daily. Patient not taking: Reported on 07/02/2021 06/01/21   Milton Ferguson, MD  cetirizine (ZYRTEC) 10 MG tablet Take 10 mg by mouth daily as needed for allergies. Patient not taking: Reported on 07/02/2021 01/16/21   [provider]  diclofenac Sodium (VOLTAREN) 1 % GEL Apply 2 g topically 4 (four) times daily as needed (joint  pain). 03/12/19   [provider]  DULoxetine (CYMBALTA) 30 MG capsule Take 30 mg by mouth daily. 03/05/21   [provider]  fluticasone furoate-vilanterol (BREO ELLIPTA) 100-25 MCG/ACT AEPB Inhale 1 puff into the lungs daily as needed (for shortness of breath). 04/17/21   [provider]  folic acid (FOLVITE) 1 MG tablet Take 1 tablet (1 mg total) by mouth daily. Patient not taking: Reported on 07/02/2021 04/09/21   Mariel Aloe, MD  gabapentin (NEURONTIN) 400 MG capsule Take 400-800 mg by mouth at bedtime. 03/26/21    [provider]  lidocaine (LIDODERM) 5 % Place 1 patch onto the skin daily as needed. Remove & Discard patch within 12 hours or as directed by MD 07/02/21   Jeanell Sparrow, DO  linaclotide (LINZESS) 145 MCG CAPS capsule Take 145 mcg by mouth daily before breakfast.    [provider]  oxyCODONE (ROXICODONE) 5 MG immediate release tablet Take 1 tablet (5 mg total) by mouth every 4 (four) hours as needed for severe pain. 07/02/21   Jeanell Sparrow, DO  oxyCODONE-acetaminophen (PERCOCET) 10-325 MG tablet Take 1 tablet by mouth every 4 (four) hours as needed for pain. 04/08/21   Mariel Aloe, MD  pantoprazole (PROTONIX) 40 MG tablet Take 1 tablet (40 mg total) by mouth 2 (two) times daily before a meal. Patient taking differently: Take 40 mg by mouth daily. 06/29/18   Dessa Phi, DO  vitamin B-12 1000 MCG tablet Take 1 tablet (1,000 mcg total) by mouth daily. Patient not taking: Reported on 07/02/2021 04/09/21   Mariel Aloe, MD      Allergies    Iodine-131, Iohexol, Levofloxacin, Zofran [ondansetron hcl], Heparin, and Morphine and related    Review of Systems   Review of Systems  Physical Exam Updated Vital Signs BP (!) 141/82 (BP Location: Right Arm)   Pulse 78   Temp 97.9 F (36.6 C) (Oral)   Resp (!) 22   SpO2 95%  Physical Exam Constitutional:      General: She is not in acute distress. HENT:     Head: Normocephalic and atraumatic.  Eyes:     Conjunctiva/sclera: Conjunctivae normal.     Pupils: Pupils are equal, round, and reactive to light.  Cardiovascular:     Rate and Rhythm: Normal rate and regular rhythm.  Pulmonary:     Effort: Pulmonary effort is normal. No respiratory distress.  Abdominal:     General: There is no distension.     Tenderness: There is no abdominal tenderness.  Skin:    General: Skin is warm and dry.  Neurological:     General: No focal deficit present.     Mental Status: She is alert and oriented to person, place, and time.  Mental status is at baseline.     Sensory: No sensory deficit.     Motor: No weakness.     Comments: Patient has 5 out of 5 strength in the upper and lower extremities, good grip strength, no weakness     ED Results / Procedures / Treatments   Labs (all labs ordered are listed, but only abnormal results are displayed) Labs Reviewed - No data to display  EKG None  Radiology MR Cervical Spine Wo Contrast  Result Date: 07/02/2021 CLINICAL DATA:  Myelopathy, acute, cervical spine; technologist note states recent fall hitting back of head with pain EXAM: MRI CERVICAL SPINE WITHOUT CONTRAST TECHNIQUE: Multiplanar, multisequence MR imaging of the cervical spine was performed.  No intravenous contrast was administered. COMPARISON:  04/02/2021 FINDINGS: Alignment: Stable including retrolisthesis at C3-C4. Vertebrae: Stable vertebral body heights. Degenerative plate irregularity above the operative levels. Post ACDF at C5-C7. Hardware is better evaluated on prior CT. No substantial marrow edema. Cord: Subtle cord T2 hyperintensity at C3-C4 as present previously. Previous abnormal signal dorsally at C4 and centrally at C6-C7 not as well seen. 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 Otherwise unremarkable. Disc levels: C2-C3: Disc bulge with endplate osteophytes. Uncovertebral greater than facet hypertrophy. Thickening of the ligamentum flavum. Marked canal stenosis. Moderate foraminal stenosis. C3-C4: Disc osteophyte complex slightly eccentric to the right. Uncovertebral greater than facet hypertrophy. Ligamentum flavum thickening. Marked canal stenosis with cord compression. Moderate foraminal stenosis. C4-C5: Disc bulge with endplate osteophytes. Uncovertebral greater than facet hypertrophy. Mild canal stenosis. Moderate to marked foraminal stenosis. C5-C6: Operative level. Bridging bone with endplate osteophytes and facet hypertrophy. No  canal stenosis. Moderate right foraminal stenosis. No left foraminal stenosis. C6-C7: Operative level. Bridging bone and endplate osteophytes. No canal or foraminal stenosis. C7-T1:  Facet hypertrophy.  No canal or foraminal stenosis. IMPRESSION: Multilevel degenerative changes as detailed above similar to the prior study. As before, there is marked canal stenosis at C2-C3 and C3-C4 with cord compression at C3-C4. Persistent abnormal signal at C3-C4 reflecting edema or myelomalacia. Foraminal narrowing remains greatest at C4-C5. No evidence of ligamentous or soft tissue injury. Electronically Signed   By: Macy Mis M.D.   On: 07/02/2021 08:36   CT Head Wo Contrast  Result Date: 07/02/2021 CLINICAL DATA:  Fall from bed with head injury and neck pain EXAM: CT HEAD WITHOUT CONTRAST CT CERVICAL SPINE WITHOUT CONTRAST TECHNIQUE: Multidetector CT imaging of the head and cervical spine was performed following the standard protocol without intravenous contrast. Multiplanar CT image reconstructions of the cervical spine were also generated. RADIATION DOSE REDUCTION: This exam was performed according to the departmental dose-optimization program which includes automated exposure control, adjustment of the mA and/or kV according to patient size and/or use of iterative reconstruction technique. COMPARISON:  06/01/2021 FINDINGS: CT HEAD FINDINGS Brain: No evidence of acute infarction, hemorrhage, hydrocephalus, extra-axial collection or mass lesion/mass effect. Chronic appearing low-density at the right anterior capsule and caudate head. Vascular: No hyperdense vessel or unexpected calcification. Skull: No acute fracture. Stable deformity at the bilateral mandibular neck. Sinuses/Orbits: No acute finding. CT CERVICAL SPINE FINDINGS Alignment: No traumatic malalignment Skull base and vertebrae: No acute fracture. No primary bone lesion or focal pathologic process. C5-6 and C6-7 ACDF with solid arthrodesis. Soft tissues  and spinal canal: No prevertebral fluid or swelling. No visible canal hematoma. Known left parotid mass evaluated by neck CT earlier this year Disc levels: Advanced disc degeneration at C2-3 to C4-5. C5-6 and C6-7 ACDF. Upper chest: Biapical emphysema IMPRESSION: No evidence of acute intracranial or cervical spine injury. Electronically Signed   By: Jorje Guild M.D.   On: 07/02/2021 06:04   CT Cervical Spine Wo Contrast  Result Date: 07/02/2021 CLINICAL DATA:  Fall from bed with head injury and neck pain EXAM: CT HEAD WITHOUT CONTRAST CT CERVICAL SPINE WITHOUT CONTRAST TECHNIQUE: Multidetector CT imaging of the head and cervical spine was performed following the standard protocol without intravenous contrast. Multiplanar CT image reconstructions of the cervical spine were also generated. RADIATION DOSE REDUCTION: This exam was performed according to the departmental dose-optimization program which includes automated exposure control, adjustment of the mA and/or kV according to patient  size and/or use of iterative reconstruction technique. COMPARISON:  06/01/2021 FINDINGS: CT HEAD FINDINGS Brain: No evidence of acute infarction, hemorrhage, hydrocephalus, extra-axial collection or mass lesion/mass effect. Chronic appearing low-density at the right anterior capsule and caudate head. Vascular: No hyperdense vessel or unexpected calcification. Skull: No acute fracture. Stable deformity at the bilateral mandibular neck. Sinuses/Orbits: No acute finding. CT CERVICAL SPINE FINDINGS Alignment: No traumatic malalignment Skull base and vertebrae: No acute fracture. No primary bone lesion or focal pathologic process. C5-6 and C6-7 ACDF with solid arthrodesis. Soft tissues and spinal canal: No prevertebral fluid or swelling. No visible canal hematoma. Known left parotid mass evaluated by neck CT earlier this year Disc levels: Advanced disc degeneration at C2-3 to C4-5. C5-6 and C6-7 ACDF. Upper chest: Biapical emphysema  IMPRESSION: No evidence of acute intracranial or cervical spine injury. Electronically Signed   By: Jorje Guild M.D.   On: 07/02/2021 06:04   DG Chest 1 View  Result Date: 07/02/2021 CLINICAL DATA:  Fall. EXAM: CHEST  1 VIEW COMPARISON:  06/01/2021 FINDINGS: 0532 hours. Low volume film. The cardio pericardial silhouette is enlarged. There is pulmonary vascular congestion and a component of interstitial pulmonary edema not excluded. The visualized bony structures of the thorax are unremarkable. Telemetry leads overlie the chest. IMPRESSION: Enlarged cardiopericardial silhouette with pulmonary vascular congestion and possible interstitial pulmonary edema Electronically Signed   By: Misty Stanley M.D.   On: 07/02/2021 05:58   DG Knee Complete 4 Views Right  Result Date: 07/02/2021 CLINICAL DATA:  Fall.  Knee pain. EXAM: RIGHT KNEE - COMPLETE 4+ VIEW COMPARISON:  None Available. FINDINGS: No evidence for an acute fracture. No subluxation or dislocation. Tiny joint effusion evident. Marked loss of joint space noted lateral compartment with prominent hypertrophic spurring in all 3 compartments. Probable degenerative subchondral cyst formation lateral tibial plateau. IMPRESSION: 1. No acute bony abnormality. Tiny joint effusion. 2. Tricompartmental degenerative changes. Electronically Signed   By: Misty Stanley M.D.   On: 07/02/2021 05:56    Procedures Procedures  {Document cardiac monitor, telemetry assessment procedure when appropriate:1}  Medications Ordered in ED Medications  HYDROmorphone (DILAUDID) tablet 2 mg (has no administration in time range)  dexamethasone (DECADRON) injection 8 mg (has no administration in time range)    ED Course/ Medical Decision Making/ A&P                           Medical Decision Making Risk Prescription drug management.   Patient is here complaining continued neck pain, reports she was referred in from a walk-in clinic, after being seen and discharged in  [facility] emergency department earlier today.  Unfortunately I suspect there was some confusion regarding her medical providers after her discharge.  I do not see any emergent indication for referral back to the hospital.  I suspect that the clinic her provider she saw earlier may not have known that she was evaluated in the ED and had CT scans of her head as well as her neck at the time, which did not show any intracranial injury or acute fracture.  Her MRI does show some spinal cord stenosis, but she does not have acute weakness or deficits on exam to suggest a cord impingement.  Her strength is actually surprisingly good on upper extremity testing.  We will give her some oral pain medication as well as shot of IM Decadron, can give her some food here and discharge her and have her  follow-up with a neurosurgeon.  {Document critical care time when appropriate:1} {Document review of labs and clinical decision tools ie heart score, Chads2Vasc2 etc:1}  {Document your independent review of radiology images, and any outside records:1} {Document your discussion with family members, caretakers, and with consultants:1} {Document social determinants of health affecting pt's care:1} {Document your decision making why or why not admission, treatments were needed:1} Final Clinical Impression(s) / ED Diagnoses Final diagnoses:  None    Rx / DC Orders ED Discharge Orders     None

## 2021-07-02 NOTE — ED Triage Notes (Signed)
Patient BIB [facility] from [facility]. Patient complains of back and neck pain after a fall. VSS. Patient seen as [facility] for same earlier today, negative MRI and CT of head and neck.  BP 138/80 HR 80 98% on room air

## 2021-07-02 NOTE — ED Provider Notes (Signed)
[facility] DEPT Provider Note   CSN: [id number] Arrival date & time: 07/02/21  0047     History  Chief Complaint  Patient presents with   Fall   Neck Pain   Back Pain    [name] is a 66 y.o. female.  The history is provided by the patient and medical records.  Fall  Neck Pain Back Pain [name] is a 66 y.o. female who presents to the Emergency Department complaining of fall.  She presents to the ED for evaluation of injuires following a slip and fall that occurred at home.  She was getting out of bed and slipped, falling forwards hitting her head twice.  She complains of pain to the back of her neck that radiates to her shoulder bilaterally.  Also has pain to her right knee.  Currently scheduled for neck surgery.       Home Medications Prior to Admission medications   Medication Sig Start Date End Date Taking? Authorizing Provider  albuterol (VENTOLIN HFA) 108 (90 Base) MCG/ACT inhaler Inhale 1-2 puffs into the lungs every 6 (six) hours as needed for wheezing or shortness of breath. 11/24/20  Yes Kathie Dike, MD  amitriptyline (ELAVIL) 50 MG tablet Take 50 mg by mouth at bedtime.   Yes [provider]  amLODipine (NORVASC) 10 MG tablet Take 10 mg by mouth every morning. 03/12/19  Yes [provider]  atorvastatin (LIPITOR) 40 MG tablet Take 1 tablet (40 mg total) by mouth daily at 6 PM. Patient taking differently: Take 40 mg by mouth at bedtime. 08/22/14  Yes Rai, Ripudeep K, MD  baclofen (LIORESAL) 10 MG tablet Take 10 mg by mouth 3 (three) times daily as needed for muscle spasms. 05/30/21  Yes [provider]  diclofenac Sodium (VOLTAREN) 1 % GEL Apply 2 g topically 4 (four) times daily as needed (joint pain). 03/12/19  Yes [provider]  DULoxetine (CYMBALTA) 30 MG capsule Take 30 mg by mouth daily. 03/05/21  Yes [provider]  fluticasone furoate-vilanterol (BREO ELLIPTA) 100-25  MCG/ACT AEPB Inhale 1 puff into the lungs daily as needed (for shortness of breath). 04/17/21  Yes [provider]  gabapentin (NEURONTIN) 400 MG capsule Take 400-800 mg by mouth at bedtime. 03/26/21  Yes [provider]  lidocaine (LIDODERM) 5 % Place 1 patch onto the skin daily as needed. Remove & Discard patch within 12 hours or as directed by MD 07/02/21  Yes Jeanell Sparrow, DO  linaclotide (LINZESS) 145 MCG CAPS capsule Take 145 mcg by mouth daily before breakfast.   Yes [provider]  oxyCODONE (ROXICODONE) 5 MG immediate release tablet Take 1 tablet (5 mg total) by mouth every 4 (four) hours as needed for severe pain. 07/02/21  Yes Jeanell Sparrow, DO  oxyCODONE-acetaminophen (PERCOCET) 10-325 MG tablet Take 1 tablet by mouth every 4 (four) hours as needed for pain. 04/08/21  Yes Mariel Aloe, MD  pantoprazole (PROTONIX) 40 MG tablet Take 1 tablet (40 mg total) by mouth 2 (two) times daily before a meal. Patient taking differently: Take 40 mg by mouth daily. 06/29/18  Yes Dessa Phi, DO  cephALEXin (KEFLEX) 500 MG capsule Take 1 capsule (500 mg total) by mouth 4 (four) times daily. Patient not taking: Reported on 07/02/2021 06/01/21   Milton Ferguson, MD  cetirizine (ZYRTEC) 10 MG tablet Take 10 mg by mouth daily as needed for allergies. Patient not taking: Reported on 07/02/2021 01/16/21   [provider]  folic acid (FOLVITE) 1 MG tablet Take 1 tablet (1 mg total) by mouth daily. Patient not taking: Reported on 07/02/2021 04/09/21   Mariel Aloe, MD  vitamin B-12 1000 MCG tablet Take 1 tablet (1,000 mcg total) by mouth daily. Patient not taking: Reported on 07/02/2021 04/09/21   Mariel Aloe, MD      Allergies    Iodine-131, Iohexol, Levofloxacin, Zofran [ondansetron hcl], Heparin, and Morphine and related    Review of Systems   Review of Systems  Musculoskeletal:  Positive for back pain and neck pain.  All other systems reviewed and are  negative.   Physical Exam Updated Vital Signs BP (!) 141/105 (BP Location: Right Arm)   Pulse 66   Temp 98 F (36.7 C) (Oral)   Resp 16   SpO2 96%  Physical Exam Vitals and nursing note reviewed.  Constitutional:      Appearance: She is well-developed.  HENT:     Head: Normocephalic and atraumatic.  Neck:     Comments: TTP throughout posterior neck.   Cardiovascular:     Rate and Rhythm: Normal rate and regular rhythm.     Heart sounds: No murmur heard. Pulmonary:     Effort: Pulmonary effort is normal. No respiratory distress.     Breath sounds: Normal breath sounds.  Abdominal:     Palpations: Abdomen is soft.     Tenderness: There is no abdominal tenderness. There is no guarding or rebound.  Musculoskeletal:     Comments: TTP over right knee.    Skin:    General: Skin is warm and dry.  Neurological:     Mental Status: She is alert and oriented to person, place, and time.     Comments: 4/5 strength in bilateral distal upper extremities, 3/5 strength in proximal BUE.  LUE is slightly weaker than right.  5/5 strength in BLE.    Psychiatric:     Comments: Anxious, tearful     ED Results / Procedures / Treatments   Labs (all labs ordered are listed, but only abnormal results are displayed) Labs Reviewed  BASIC METABOLIC PANEL - Abnormal; Notable for the following components:      Result Value   Calcium 8.5 (*)    All other components within normal limits  CBC WITH DIFFERENTIAL/PLATELET - Abnormal; Notable for the following components:   Hemoglobin 11.5 (*)    All other components within normal limits  RAPID URINE DRUG SCREEN, HOSP PERFORMED    EKG None  Radiology MR Cervical Spine Wo Contrast  Result Date: 07/02/2021 CLINICAL DATA:  Myelopathy, acute, cervical spine; technologist note states recent fall hitting back of head with pain EXAM: MRI CERVICAL SPINE WITHOUT CONTRAST TECHNIQUE: Multiplanar, multisequence MR imaging of the cervical spine was performed.  No intravenous contrast was administered. COMPARISON:  04/02/2021 FINDINGS: Alignment: Stable including retrolisthesis at C3-C4. Vertebrae: Stable vertebral body heights. Degenerative plate irregularity above the operative levels. Post ACDF at C5-C7. Hardware is better evaluated on prior CT. No substantial marrow edema. Cord: Subtle cord T2 hyperintensity at C3-C4 as present previously. Previous abnormal signal dorsally at C4 and centrally at C6-C7 not as well seen. 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 Otherwise unremarkable. Disc levels: C2-C3: Disc bulge with endplate osteophytes. Uncovertebral greater than facet hypertrophy. Thickening of the ligamentum flavum. Marked canal stenosis. Moderate foraminal stenosis. C3-C4: Disc osteophyte complex slightly eccentric to the right. Uncovertebral greater than facet hypertrophy. Ligamentum  flavum thickening. Marked canal stenosis with cord compression. Moderate foraminal stenosis. C4-C5: Disc bulge with endplate osteophytes. Uncovertebral greater than facet hypertrophy. Mild canal stenosis. Moderate to marked foraminal stenosis. C5-C6: Operative level. Bridging bone with endplate osteophytes and facet hypertrophy. No canal stenosis. Moderate right foraminal stenosis. No left foraminal stenosis. C6-C7: Operative level. Bridging bone and endplate osteophytes. No canal or foraminal stenosis. C7-T1:  Facet hypertrophy.  No canal or foraminal stenosis. IMPRESSION: Multilevel degenerative changes as detailed above similar to the prior study. As before, there is marked canal stenosis at C2-C3 and C3-C4 with cord compression at C3-C4. Persistent abnormal signal at C3-C4 reflecting edema or myelomalacia. Foraminal narrowing remains greatest at C4-C5. No evidence of ligamentous or soft tissue injury. Electronically Signed   By: Macy Mis M.D.   On: 07/02/2021 08:36   CT Head Wo Contrast  Result Date:  07/02/2021 CLINICAL DATA:  Fall from bed with head injury and neck pain EXAM: CT HEAD WITHOUT CONTRAST CT CERVICAL SPINE WITHOUT CONTRAST TECHNIQUE: Multidetector CT imaging of the head and cervical spine was performed following the standard protocol without intravenous contrast. Multiplanar CT image reconstructions of the cervical spine were also generated. RADIATION DOSE REDUCTION: This exam was performed according to the departmental dose-optimization program which includes automated exposure control, adjustment of the mA and/or kV according to patient size and/or use of iterative reconstruction technique. COMPARISON:  06/01/2021 FINDINGS: CT HEAD FINDINGS Brain: No evidence of acute infarction, hemorrhage, hydrocephalus, extra-axial collection or mass lesion/mass effect. Chronic appearing low-density at the right anterior capsule and caudate head. Vascular: No hyperdense vessel or unexpected calcification. Skull: No acute fracture. Stable deformity at the bilateral mandibular neck. Sinuses/Orbits: No acute finding. CT CERVICAL SPINE FINDINGS Alignment: No traumatic malalignment Skull base and vertebrae: No acute fracture. No primary bone lesion or focal pathologic process. C5-6 and C6-7 ACDF with solid arthrodesis. Soft tissues and spinal canal: No prevertebral fluid or swelling. No visible canal hematoma. Known left parotid mass evaluated by neck CT earlier this year Disc levels: Advanced disc degeneration at C2-3 to C4-5. C5-6 and C6-7 ACDF. Upper chest: Biapical emphysema IMPRESSION: No evidence of acute intracranial or cervical spine injury. Electronically Signed   By: Jorje Guild M.D.   On: 07/02/2021 06:04   CT Cervical Spine Wo Contrast  Result Date: 07/02/2021 CLINICAL DATA:  Fall from bed with head injury and neck pain EXAM: CT HEAD WITHOUT CONTRAST CT CERVICAL SPINE WITHOUT CONTRAST TECHNIQUE: Multidetector CT imaging of the head and cervical spine was performed following the standard protocol  without intravenous contrast. Multiplanar CT image reconstructions of the cervical spine were also generated. RADIATION DOSE REDUCTION: This exam was performed according to the departmental dose-optimization program which includes automated exposure control, adjustment of the mA and/or kV according to patient size and/or use of iterative reconstruction technique. COMPARISON:  06/01/2021 FINDINGS: CT HEAD FINDINGS Brain: No evidence of acute infarction, hemorrhage, hydrocephalus, extra-axial collection or mass lesion/mass effect. Chronic appearing low-density at the right anterior capsule and caudate head. Vascular: No hyperdense vessel or unexpected calcification. Skull: No acute fracture. Stable deformity at the bilateral mandibular neck. Sinuses/Orbits: No acute finding. CT CERVICAL SPINE FINDINGS Alignment: No traumatic malalignment Skull base and vertebrae: No acute fracture. No primary bone lesion or focal pathologic process. C5-6 and C6-7 ACDF with solid arthrodesis. Soft tissues and spinal canal: No prevertebral fluid or swelling. No visible canal hematoma. 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 C5-6 and C6-7 ACDF. Upper chest: Biapical emphysema IMPRESSION: No evidence of acute intracranial or cervical spine injury. Electronically Signed   By: Jorje Guild M.D.   On: 07/02/2021 06:04   DG Chest 1 View  Result Date: 07/02/2021 CLINICAL DATA:  Fall. EXAM: CHEST  1 VIEW COMPARISON:  06/01/2021 FINDINGS: 0532 hours. Low volume film. The cardio pericardial silhouette is enlarged. There is pulmonary vascular congestion and a component of interstitial pulmonary edema not excluded. The visualized bony structures of the thorax are unremarkable. Telemetry leads overlie the chest. IMPRESSION: Enlarged cardiopericardial silhouette with pulmonary vascular congestion and possible interstitial pulmonary edema Electronically Signed   By:  Misty Stanley M.D.   On: 07/02/2021 05:58   DG Knee Complete 4 Views Right  Result Date: 07/02/2021 CLINICAL DATA:  Fall.  Knee pain. EXAM: RIGHT KNEE - COMPLETE 4+ VIEW COMPARISON:  None Available. FINDINGS: No evidence for an acute fracture. No subluxation or dislocation. Tiny joint effusion evident. Marked loss of joint space noted lateral compartment with prominent hypertrophic spurring in all 3 compartments. Probable degenerative subchondral cyst formation lateral tibial plateau. IMPRESSION: 1. No acute bony abnormality. Tiny joint effusion. 2. Tricompartmental degenerative changes. Electronically Signed   By: Misty Stanley M.D.   On: 07/02/2021 05:56    Procedures Procedures    Medications Ordered in ED Medications  lidocaine (LIDODERM) 5 % 1 patch (has no administration in time range)  gabapentin (NEURONTIN) capsule 400 mg (400 mg Oral Given 07/02/21 0525)  oxyCODONE-acetaminophen (PERCOCET/ROXICET) 5-325 MG per tablet 1 tablet (1 tablet Oral Given 07/02/21 5035)    ED Course/ Medical Decision Making/ A&P                           Medical Decision Making Amount and/or Complexity of Data Reviewed Labs: ordered. Radiology: ordered.  Risk Prescription drug management.   Pt with hx/o RA, cervical spinal stenosis with ongoing radiculopathy here for evaluation of acute on chronic neck pain following a slip and fall.  She has weakness on exam in BUE, is able to pull herself up in the stretcher without difficulty.  She refuses ccollar in the ED.  CT is negative for acute traumatic injury.  It is very difficult to ascertain what weakness may be new and what is chronic issue for her.  Plan to obtain MRI to r/o new/progressive cord injury.  CXR with possible pulmonary vascular congestion - pt breathing comfortably on room air, lungs clear.  Current picture not c/w acute CHF.  Pt care transferred pending MRI.         Final Clinical Impression(s) / ED Diagnoses Final diagnoses:  Fall,  initial encounter  Neck pain    Rx / DC Orders ED Discharge Orders          Ordered    lidocaine (LIDODERM) 5 %  Daily PRN        07/02/21 0904    oxyCODONE (ROXICODONE) 5 MG immediate release tablet  Every 4 hours PRN        07/02/21 0904              Quintella Reichert, MD 07/02/21 [phone]

## 2021-07-06 ENCOUNTER — Emergency Department (HOSPITAL_COMMUNITY)
Admission: EM | Admit: 2021-07-06 | Discharge: 2021-07-06 | Disposition: A | Discharge status: Home / Self Care | Payer: Medicare Other | Attending: Emergency Medicine | Admitting: Emergency Medicine

## 2021-07-06 ENCOUNTER — Emergency Department (HOSPITAL_COMMUNITY): Payer: Medicare Other

## 2021-07-06 ENCOUNTER — Encounter (HOSPITAL_COMMUNITY): Payer: Self-pay | Admitting: Emergency Medicine

## 2021-07-06 ENCOUNTER — Other Ambulatory Visit: Payer: Self-pay

## 2021-07-06 DIAGNOSIS — N183 Chronic kidney disease, stage 3 unspecified: Secondary | ICD-10-CM | POA: Diagnosis not present

## 2021-07-06 DIAGNOSIS — Z79899 Other long term (current) drug therapy: Secondary | ICD-10-CM | POA: Diagnosis not present

## 2021-07-06 DIAGNOSIS — F17210 Nicotine dependence, cigarettes, uncomplicated: Secondary | ICD-10-CM | POA: Diagnosis not present

## 2021-07-06 DIAGNOSIS — I129 Hypertensive chronic kidney disease with stage 1 through stage 4 chronic kidney disease, or unspecified chronic kidney disease: Secondary | ICD-10-CM | POA: Insufficient documentation

## 2021-07-06 DIAGNOSIS — R7309 Other abnormal glucose: Secondary | ICD-10-CM | POA: Insufficient documentation

## 2021-07-06 DIAGNOSIS — G43009 Migraine without aura, not intractable, without status migrainosus: Secondary | ICD-10-CM | POA: Diagnosis not present

## 2021-07-06 DIAGNOSIS — R519 Headache, unspecified: Secondary | ICD-10-CM | POA: Diagnosis present

## 2021-07-06 LAB — COMPREHENSIVE METABOLIC PANEL
ALT: 11 U/L (ref 0–44)
AST: 9 U/L — ABNORMAL LOW (ref 15–41)
Albumin: 3.2 g/dL — ABNORMAL LOW (ref 3.5–5.0)
Alkaline Phosphatase: 91 U/L (ref 38–126)
Anion gap: 10 (ref 5–15)
BUN: 9 mg/dL (ref 8–23)
CO2: 23 mmol/L (ref 22–32)
Calcium: 8.8 mg/dL — ABNORMAL LOW (ref 8.9–10.3)
Chloride: 108 mmol/L (ref 98–111)
Creatinine, Ser: 0.83 mg/dL (ref 0.44–1.00)
GFR, Estimated: >60 mL/min (ref >60)
Glucose, Bld: 102 mg/dL — ABNORMAL HIGH (ref 70–99)
Potassium: 4.1 mmol/L (ref 3.5–5.1)
Sodium: 141 mmol/L (ref 135–145)
Total Bilirubin: 0.5 mg/dL (ref 0.3–1.2)
Total Protein: 6.6 g/dL (ref 6.5–8.1)

## 2021-07-06 LAB — URINALYSIS, ROUTINE W REFLEX MICROSCOPIC
Bilirubin Urine: NEGATIVE
Glucose, UA: NEGATIVE mg/dL
Hgb urine dipstick: NEGATIVE
Ketones, ur: NEGATIVE mg/dL
Leukocytes,Ua: NEGATIVE
Nitrite: NEGATIVE
Protein, ur: NEGATIVE mg/dL
Specific Gravity, Urine: 1.014 (ref 1.005–1.030)
pH: 7 (ref 5.0–8.0)

## 2021-07-06 LAB — CBC WITH DIFFERENTIAL/PLATELET
Abs Immature Granulocytes: 0.02 10*3/uL (ref 0.00–0.07)
Basophils Absolute: 0 10*3/uL (ref 0.0–0.1)
Basophils Relative: 0 %
Eosinophils Absolute: 0.2 10*3/uL (ref 0.0–0.5)
Eosinophils Relative: 4 %
HCT: 38.7 % (ref 36.0–46.0)
Hemoglobin: 12.6 g/dL (ref 12.0–15.0)
Immature Granulocytes: 0 %
Lymphocytes Relative: 36 %
Lymphs Abs: 1.9 10*3/uL (ref 0.7–4.0)
MCH: 29.8 pg (ref 26.0–34.0)
MCHC: 32.6 g/dL (ref 30.0–36.0)
MCV: 91.5 fL (ref 80.0–100.0)
Monocytes Absolute: 0.3 10*3/uL (ref 0.1–1.0)
Monocytes Relative: 7 %
Neutro Abs: 2.7 10*3/uL (ref 1.7–7.7)
Neutrophils Relative %: 53 %
Platelets: 308 10*3/uL (ref 150–400)
RBC: 4.23 MIL/uL (ref 3.87–5.11)
RDW: 14.4 % (ref 11.5–15.5)
WBC: 5.2 10*3/uL (ref 4.0–10.5)
nRBC: 0 % (ref 0.0–0.2)

## 2021-07-06 LAB — LIPASE, BLOOD: Lipase: 23 U/L (ref 11–51)

## 2021-07-06 LAB — TROPONIN I (HIGH SENSITIVITY)
Troponin I (High Sensitivity): 12 ng/L (ref <18)
Troponin I (High Sensitivity): 11 ng/L (ref <18)

## 2021-07-06 MED ORDER — DIPHENHYDRAMINE HCL 50 MG/ML IJ SOLN
25.0000 mg | Freq: Once | INTRAMUSCULAR | Status: AC
  Administered 2021-07-06: 25 mg via INTRAVENOUS
  Filled 2021-07-06: qty 1

## 2021-07-06 MED ORDER — SODIUM CHLORIDE 0.9 % IV BOLUS
1000.0000 mL | Freq: Once | INTRAVENOUS | Status: AC
  Administered 2021-07-06: 1000 mL via INTRAVENOUS

## 2021-07-06 MED ORDER — ACETAMINOPHEN 500 MG PO TABS
1000.0000 mg | ORAL_TABLET | Freq: Once | ORAL | Status: DC
  Filled 2021-07-06: qty 2

## 2021-07-06 NOTE — ED Notes (Signed)
Patient transported to X-ray 

## 2021-07-17 ENCOUNTER — Encounter (HOSPITAL_COMMUNITY): Payer: Self-pay | Admitting: Internal Medicine

## 2021-07-17 ENCOUNTER — Observation Stay (HOSPITAL_COMMUNITY): Payer: Medicare Other

## 2021-07-17 ENCOUNTER — Emergency Department (HOSPITAL_COMMUNITY): Payer: Medicare Other

## 2021-07-17 ENCOUNTER — Other Ambulatory Visit: Payer: Self-pay

## 2021-07-17 ENCOUNTER — Observation Stay (HOSPITAL_COMMUNITY)
Admission: EM | Admit: 2021-07-17 | Discharge: 2021-07-18 | Disposition: A | Discharge status: Home Health | Payer: Medicare Other | Attending: Internal Medicine | Admitting: Internal Medicine

## 2021-07-17 DIAGNOSIS — Z79899 Other long term (current) drug therapy: Secondary | ICD-10-CM | POA: Diagnosis not present

## 2021-07-17 DIAGNOSIS — I129 Hypertensive chronic kidney disease with stage 1 through stage 4 chronic kidney disease, or unspecified chronic kidney disease: Secondary | ICD-10-CM | POA: Insufficient documentation

## 2021-07-17 DIAGNOSIS — R4182 Altered mental status, unspecified: Secondary | ICD-10-CM | POA: Diagnosis present

## 2021-07-17 DIAGNOSIS — Z79891 Long term (current) use of opiate analgesic: Secondary | ICD-10-CM | POA: Diagnosis not present

## 2021-07-17 DIAGNOSIS — J449 Chronic obstructive pulmonary disease, unspecified: Secondary | ICD-10-CM | POA: Insufficient documentation

## 2021-07-17 DIAGNOSIS — F17210 Nicotine dependence, cigarettes, uncomplicated: Secondary | ICD-10-CM | POA: Insufficient documentation

## 2021-07-17 DIAGNOSIS — I951 Orthostatic hypotension: Secondary | ICD-10-CM | POA: Insufficient documentation

## 2021-07-17 DIAGNOSIS — N183 Chronic kidney disease, stage 3 unspecified: Secondary | ICD-10-CM | POA: Insufficient documentation

## 2021-07-17 DIAGNOSIS — G9340 Encephalopathy, unspecified: Secondary | ICD-10-CM | POA: Diagnosis not present

## 2021-07-17 LAB — I-STAT CHEM 8, ED
BUN: 18 mg/dL (ref 8–23)
Calcium, Ion: 1.12 mmol/L — ABNORMAL LOW (ref 1.15–1.40)
Chloride: 109 mmol/L (ref 98–111)
Creatinine, Ser: 0.8 mg/dL (ref 0.44–1.00)
Glucose, Bld: 102 mg/dL — ABNORMAL HIGH (ref 70–99)
HCT: 32 % — ABNORMAL LOW (ref 36.0–46.0)
Hemoglobin: 10.9 g/dL — ABNORMAL LOW (ref 12.0–15.0)
Potassium: 4.2 mmol/L (ref 3.5–5.1)
Sodium: 139 mmol/L (ref 135–145)
TCO2: 23 mmol/L (ref 22–32)

## 2021-07-17 LAB — CBC WITH DIFFERENTIAL/PLATELET
Abs Immature Granulocytes: 0.02 10*3/uL (ref 0.00–0.07)
Basophils Absolute: 0 10*3/uL (ref 0.0–0.1)
Basophils Relative: 0 %
Eosinophils Absolute: 0.4 10*3/uL (ref 0.0–0.5)
Eosinophils Relative: 4 %
HCT: 32.2 % — ABNORMAL LOW (ref 36.0–46.0)
Hemoglobin: 10.3 g/dL — ABNORMAL LOW (ref 12.0–15.0)
Immature Granulocytes: 0 %
Lymphocytes Relative: 18 %
Lymphs Abs: 1.7 10*3/uL (ref 0.7–4.0)
MCH: 29.9 pg (ref 26.0–34.0)
MCHC: 32 g/dL (ref 30.0–36.0)
MCV: 93.6 fL (ref 80.0–100.0)
Monocytes Absolute: 0.7 10*3/uL (ref 0.1–1.0)
Monocytes Relative: 8 %
Neutro Abs: 6.5 10*3/uL (ref 1.7–7.7)
Neutrophils Relative %: 70 %
Platelets: 266 10*3/uL (ref 150–400)
RBC: 3.44 MIL/uL — ABNORMAL LOW (ref 3.87–5.11)
RDW: 14.4 % (ref 11.5–15.5)
WBC: 9.2 10*3/uL (ref 4.0–10.5)
nRBC: 0 % (ref 0.0–0.2)

## 2021-07-17 LAB — URINALYSIS, ROUTINE W REFLEX MICROSCOPIC
Bilirubin Urine: NEGATIVE
Glucose, UA: NEGATIVE mg/dL
Ketones, ur: NEGATIVE mg/dL
Nitrite: NEGATIVE
Protein, ur: NEGATIVE mg/dL
Specific Gravity, Urine: 1.004 — ABNORMAL LOW (ref 1.005–1.030)
pH: 5 (ref 5.0–8.0)

## 2021-07-17 LAB — MRSA NEXT GEN BY PCR, NASAL: MRSA by PCR Next Gen: NOT DETECTED

## 2021-07-17 LAB — RAPID URINE DRUG SCREEN, HOSP PERFORMED
Amphetamines: NOT DETECTED
Barbiturates: NOT DETECTED
Benzodiazepines: NOT DETECTED
Cocaine: NOT DETECTED
Opiates: NOT DETECTED
Tetrahydrocannabinol: NOT DETECTED

## 2021-07-17 LAB — I-STAT VENOUS BLOOD GAS, ED
Acid-Base Excess: 0 mmol/L (ref 0.0–2.0)
Bicarbonate: 23.5 mmol/L (ref 20.0–28.0)
Calcium, Ion: 1.1 mmol/L — ABNORMAL LOW (ref 1.15–1.40)
HCT: 31 % — ABNORMAL LOW (ref 36.0–46.0)
Hemoglobin: 10.5 g/dL — ABNORMAL LOW (ref 12.0–15.0)
O2 Saturation: 100 %
Potassium: 4.3 mmol/L (ref 3.5–5.1)
Sodium: 139 mmol/L (ref 135–145)
TCO2: 25 mmol/L (ref 22–32)
pCO2, Ven: 34.5 mmHg — ABNORMAL LOW (ref 44–60)
pH, Ven: 7.442 — ABNORMAL HIGH (ref 7.25–7.43)
pO2, Ven: 164 mmHg — ABNORMAL HIGH (ref 32–45)

## 2021-07-17 LAB — ETHANOL: Alcohol, Ethyl (B): <10 mg/dL (ref <10)

## 2021-07-17 LAB — COMPREHENSIVE METABOLIC PANEL
ALT: 7 U/L (ref 0–44)
AST: 12 U/L — ABNORMAL LOW (ref 15–41)
Albumin: 3 g/dL — ABNORMAL LOW (ref 3.5–5.0)
Alkaline Phosphatase: 95 U/L (ref 38–126)
Anion gap: 8 (ref 5–15)
BUN: 16 mg/dL (ref 8–23)
CO2: 21 mmol/L — ABNORMAL LOW (ref 22–32)
Calcium: 8.5 mg/dL — ABNORMAL LOW (ref 8.9–10.3)
Chloride: 111 mmol/L (ref 98–111)
Creatinine, Ser: 0.91 mg/dL (ref 0.44–1.00)
GFR, Estimated: >60 mL/min (ref >60)
Glucose, Bld: 105 mg/dL — ABNORMAL HIGH (ref 70–99)
Potassium: 4.2 mmol/L (ref 3.5–5.1)
Sodium: 140 mmol/L (ref 135–145)
Total Bilirubin: 0.5 mg/dL (ref 0.3–1.2)
Total Protein: 5.9 g/dL — ABNORMAL LOW (ref 6.5–8.1)

## 2021-07-17 LAB — CBG MONITORING, ED: Glucose-Capillary: 132 mg/dL — ABNORMAL HIGH (ref 70–99)

## 2021-07-17 LAB — AMMONIA: Ammonia: 28 umol/L (ref 9–35)

## 2021-07-17 MED ORDER — DICLOFENAC SODIUM 1 % EX GEL: 2.0000 g | Freq: Four times a day (QID) | CUTANEOUS | Status: DC | PRN

## 2021-07-17 MED ORDER — ATORVASTATIN CALCIUM 40 MG PO TABS: 40.0000 mg | ORAL_TABLET | Freq: Every day | ORAL | Status: DC

## 2021-07-17 MED ORDER — SENNOSIDES-DOCUSATE SODIUM 8.6-50 MG PO TABS: 1.0000 | ORAL_TABLET | Freq: Every evening | ORAL | Status: DC | PRN

## 2021-07-17 MED ORDER — ACETAMINOPHEN 650 MG RE SUPP: 650.0000 mg | Freq: Four times a day (QID) | RECTAL | Status: DC | PRN

## 2021-07-17 MED ORDER — PANTOPRAZOLE SODIUM 40 MG PO TBEC
40.0000 mg | DELAYED_RELEASE_TABLET | Freq: Every day | ORAL | Status: DC
  Administered 2021-07-18: 40 mg via ORAL
  Filled 2021-07-17: qty 1

## 2021-07-17 MED ORDER — ACETAMINOPHEN 325 MG PO TABS: 650.0000 mg | ORAL_TABLET | Freq: Four times a day (QID) | ORAL | Status: DC | PRN

## 2021-07-17 MED ORDER — ALBUTEROL SULFATE (2.5 MG/3ML) 0.083% IN NEBU: 2.5000 mg | INHALATION_SOLUTION | Freq: Four times a day (QID) | RESPIRATORY_TRACT | Status: DC | PRN

## 2021-07-17 MED ORDER — OXYCODONE-ACETAMINOPHEN 5-325 MG PO TABS
1.0000 | ORAL_TABLET | ORAL | Status: DC | PRN
  Administered 2021-07-17 – 2021-07-18 (×3): 1 via ORAL
  Filled 2021-07-17 (×3): qty 1

## 2021-07-17 MED ORDER — NALOXONE HCL 0.4 MG/ML IJ SOLN
0.4000 mg | Freq: Once | INTRAMUSCULAR | Status: AC
  Administered 2021-07-17: 0.4 mg via INTRAVENOUS
  Filled 2021-07-17: qty 1

## 2021-07-17 MED ORDER — RIVAROXABAN 10 MG PO TABS
10.0000 mg | ORAL_TABLET | Freq: Every day | ORAL | Status: DC
  Administered 2021-07-17 – 2021-07-18 (×2): 10 mg via ORAL
  Filled 2021-07-17 (×2): qty 1

## 2021-07-17 MED ORDER — SODIUM CHLORIDE 0.9 % IV SOLN: INTRAVENOUS | Status: DC

## 2021-07-17 MED ORDER — OXYCODONE-ACETAMINOPHEN 10-325 MG PO TABS: 1.0000 | ORAL_TABLET | ORAL | Status: DC | PRN

## 2021-07-17 MED ORDER — OXYCODONE HCL 5 MG PO TABS
5.0000 mg | ORAL_TABLET | ORAL | Status: DC | PRN
  Administered 2021-07-17 – 2021-07-18 (×4): 5 mg via ORAL
  Filled 2021-07-17 (×4): qty 1

## 2021-07-17 MED ORDER — LINACLOTIDE 145 MCG PO CAPS
145.0000 ug | ORAL_CAPSULE | Freq: Every day | ORAL | Status: DC
  Administered 2021-07-18: 145 ug via ORAL
  Filled 2021-07-17 (×2): qty 1

## 2021-07-17 MED ORDER — ACETAMINOPHEN 325 MG PO TABS
650.0000 mg | ORAL_TABLET | Freq: Once | ORAL | Status: DC
  Filled 2021-07-17: qty 2

## 2021-07-17 NOTE — ED Notes (Signed)
Bladder scan volume was 314. Patient voided prior but could not provide a time.

## 2021-07-17 NOTE — ED Notes (Signed)
Patient transported to CT 

## 2021-07-17 NOTE — ED Notes (Signed)
This RN assisted patient to bedpan. Patient continues to scream and yell out in regards to her knee pain. Patient will then fall asleep shortly after screaming and crying. Patient resting at this time. Respirations even and unlabored. NIBP, cardiac monitor and pulse ox remain in place.

## 2021-07-17 NOTE — ED Notes (Signed)
Patient continues to scream and cry out stating that her knees are hurting and she needs her pain medicine. Patient continues to refuse to take tylenol. Will give ordered PRN pain medicine alternative.

## 2021-07-17 NOTE — ED Triage Notes (Signed)
Pt BIB [facility] from home. Patient lives in a senior living facility and a medical alarm went off which prompted the 911 call. When EMS arrived patient was acting erratic, unable to answer questions. Per EMS patient was recently prescribed oxycodone on the 20th of June. 80 pills were fills and all of them are gone. Patient with hx of substance abuse. EMS stated patient will go between period of lethargy and then start acting erratic. Upon questioning patient only able to state that her R knee is bothering her and then falls back asleep. Respirations are even and unlabored. VSS.   BP- 110/76  SpO2- 95%  CBG-199  HR 80s

## 2021-07-17 NOTE — ED Provider Notes (Signed)
[facility] EMERGENCY DEPARTMENT Provider Note   CSN: [id number] Arrival date & time: 07/17/21  3419     History  Chief Complaint  Patient presents with   Altered Mental Status    [name] is a 66 y.o. female.  Differential diagnosis includes but not limited to, cerebral hemorrhage, acute electrolyte derangement, respiratory acidosis, substance use, opiate overdose   Altered Mental Status   Patient has a history of rheumatoid arthritis, hypertension, COPD, chronic kidney disease, cocaine abuse, chronic opiate use who presents to the ED with complaint points of altered mental status.  Patient lives in a senior living facility.  Medical alarm went off at the facility.  EMS found the patient acting erratically at her home.  She was wandering around walking not answering any questions.  EMS noted that the patient was prescribed 80 oxycodone pills on June 20 and they found the bottle empty.  For AMS there is time she was very lethargic and in time she was more active and agitated.  Patient told the nurse that her knee is bothering her.  I was unable to get the patient to wake up and speak to me  Home Medications Prior to Admission medications   Medication Sig Start Date End Date Taking? Authorizing Provider  albuterol (VENTOLIN HFA) 108 (90 Base) MCG/ACT inhaler Inhale 1-2 puffs into the lungs every 6 (six) hours as needed for wheezing or shortness of breath. 11/24/20   Kathie Dike, MD  amitriptyline (ELAVIL) 50 MG tablet Take 50 mg by mouth at bedtime.    [provider]  amLODipine (NORVASC) 10 MG tablet Take 10 mg by mouth every morning. 03/12/19   [provider]  atorvastatin (LIPITOR) 40 MG tablet Take 1 tablet (40 mg total) by mouth daily at 6 PM. Patient taking differently: Take 40 mg by mouth at bedtime. 08/22/14   Rai, Vernelle Emerald, MD  baclofen (LIORESAL) 10 MG tablet Take 10 mg by mouth 3 (three) times daily as needed for muscle spasms.  05/30/21   [provider]  cephALEXin (KEFLEX) 500 MG capsule Take 1 capsule (500 mg total) by mouth 4 (four) times daily. Patient not taking: Reported on 07/02/2021 06/01/21   Milton Ferguson, MD  cetirizine (ZYRTEC) 10 MG tablet Take 10 mg by mouth daily as needed for allergies. Patient not taking: Reported on 07/02/2021 01/16/21   [provider]  diclofenac Sodium (VOLTAREN) 1 % GEL Apply 2 g topically 4 (four) times daily as needed (joint pain). 03/12/19   [provider]  DULoxetine (CYMBALTA) 30 MG capsule Take 30 mg by mouth daily. 03/05/21   [provider]  fluticasone furoate-vilanterol (BREO ELLIPTA) 100-25 MCG/ACT AEPB Inhale 1 puff into the lungs daily as needed (for shortness of breath). 04/17/21   [provider]  folic acid (FOLVITE) 1 MG tablet Take 1 tablet (1 mg total) by mouth daily. Patient not taking: Reported on 07/02/2021 04/09/21   Mariel Aloe, MD  gabapentin (NEURONTIN) 400 MG capsule Take 400-800 mg by mouth at bedtime. 03/26/21   [provider]  lidocaine (LIDODERM) 5 % Place 1 patch onto the skin daily as needed. Remove & Discard patch within 12 hours or as directed by MD 07/02/21   Jeanell Sparrow, DO  linaclotide (LINZESS) 145 MCG CAPS capsule Take 145 mcg by mouth daily before breakfast.    [provider]  oxyCODONE (ROXICODONE) 5 MG immediate release tablet Take 1 tablet (5 mg total) by mouth every  4 (four) hours as needed for severe pain. 07/02/21   Jeanell Sparrow, DO  oxyCODONE-acetaminophen (PERCOCET) 10-325 MG tablet Take 1 tablet by mouth every 4 (four) hours as needed for pain. 04/08/21   Mariel Aloe, MD  pantoprazole (PROTONIX) 40 MG tablet Take 1 tablet (40 mg total) by mouth 2 (two) times daily before a meal. Patient taking differently: Take 40 mg by mouth daily. 06/29/18   Dessa Phi, DO  vitamin B-12 1000 MCG tablet Take 1 tablet (1,000 mcg total) by mouth daily. Patient not taking: Reported on  07/02/2021 04/09/21   Mariel Aloe, MD      Allergies    Iodine-131, Iohexol, Levofloxacin, Zofran [ondansetron hcl], Heparin, and Morphine and related    Review of Systems   Review of Systems  Physical Exam Updated Vital Signs BP (!) 156/81   Pulse 73   Temp 98.6 F (37 C) (Oral)   Resp 16   Ht 1.676 m ('5\' 6"'$ )   Wt 65 kg   SpO2 99%   BMI 23.13 kg/m  Physical Exam Vitals and nursing note reviewed.  Constitutional:      Appearance: She is well-developed. She is not diaphoretic.     Comments: Minimally responsive  HENT:     Head: Normocephalic and atraumatic.     Right Ear: External ear normal.     Left Ear: External ear normal.  Eyes:     General: No scleral icterus.       Right eye: No discharge.        Left eye: No discharge.     Conjunctiva/sclera: Conjunctivae normal.     Comments: Pupils are constricted  Neck:     Trachea: No tracheal deviation.  Cardiovascular:     Rate and Rhythm: Normal rate and regular rhythm.  Pulmonary:     Effort: Pulmonary effort is normal. No respiratory distress.     Breath sounds: Normal breath sounds. No stridor. No wheezing or rales.  Abdominal:     General: Bowel sounds are normal. There is no distension.     Palpations: Abdomen is soft.     Tenderness: There is no abdominal tenderness. There is no guarding or rebound.  Musculoskeletal:        General: No tenderness or deformity.     Cervical back: Neck supple.  Skin:    General: Skin is warm and dry.     Findings: No rash.  Neurological:     Motor: No seizure activity.     Comments: Somnolent, minimally responsive  Psychiatric:        Mood and Affect: Mood normal.     ED Results / Procedures / Treatments   Labs (all labs ordered are listed, but only abnormal results are displayed) Labs Reviewed  COMPREHENSIVE METABOLIC PANEL - Abnormal; Notable for the following components:      Result Value   CO2 21 (*)    Glucose, Bld 105 (*)    Calcium 8.5 (*)    Total  Protein 5.9 (*)    Albumin 3.0 (*)    AST 12 (*)    All other components within normal limits  CBC WITH DIFFERENTIAL/PLATELET - Abnormal; Notable for the following components:   RBC 3.44 (*)    Hemoglobin 10.3 (*)    HCT 32.2 (*)    All other components within normal limits  URINALYSIS, ROUTINE W REFLEX MICROSCOPIC - Abnormal; Notable for the following components:   Color, Urine STRAW (*)  Specific Gravity, Urine 1.004 (*)    Hgb urine dipstick SMALL (*)    Leukocytes,Ua TRACE (*)    Bacteria, UA RARE (*)    All other components within normal limits  CBG MONITORING, ED - Abnormal; Notable for the following components:   Glucose-Capillary 132 (*)    All other components within normal limits  I-STAT CHEM 8, ED - Abnormal; Notable for the following components:   Glucose, Bld 102 (*)    Calcium, Ion 1.12 (*)    Hemoglobin 10.9 (*)    HCT 32.0 (*)    All other components within normal limits  I-STAT VENOUS BLOOD GAS, ED - Abnormal; Notable for the following components:   pH, Ven 7.442 (*)    pCO2, Ven 34.5 (*)    pO2, Ven 164 (*)    Calcium, Ion 1.10 (*)    HCT 31.0 (*)    Hemoglobin 10.5 (*)    All other components within normal limits  AMMONIA  ETHANOL  RAPID URINE DRUG SCREEN, HOSP PERFORMED    EKG EKG Interpretation  Date/Time:  Friday July 17 2021 09:30:41 EDT Ventricular Rate:  79 PR Interval:  125 QRS Duration: 87 QT Interval:  392 QTC Calculation: 450 R Axis:   34 Text Interpretation: Sinus rhythm Probable anteroseptal infarct, old No significant change since last tracing Confirmed by Dorie Rank [phone]) on 07/17/2021 9:32:38 AM  Radiology CT HEAD WO CONTRAST  Result Date: 07/17/2021 CLINICAL DATA:  Altered mental status EXAM: CT HEAD WITHOUT CONTRAST TECHNIQUE: Contiguous axial images were obtained from the base of the skull through the vertex without intravenous contrast. RADIATION DOSE REDUCTION: This exam was performed according to the departmental  dose-optimization program which includes automated exposure control, adjustment of the mA and/or kV according to patient size and/or use of iterative reconstruction technique. COMPARISON:  CT head 07/06/2021 FINDINGS: Brain: No acute intracranial hemorrhage, mass effect, or herniation. No extra-axial fluid collections. No evidence of acute territorial infarct. No hydrocephalus. Patchy hypodensities most prominent in the basal ganglia and corona radiata regions, unchanged since previous studies. Vascular: Extensive calcified plaques in the carotid siphons. Skull: Normal. Negative for fracture or focal lesion. Sinuses/Orbits: No acute finding. Other: None. IMPRESSION: Stable chronic changes with no acute intracranial process identified. Electronically Signed   By: Ofilia Neas M.D.   On: 07/17/2021 11:23    Procedures .Critical Care  Performed by: Dorie Rank, MD Authorized by: Dorie Rank, MD   Critical care provider statement:    Critical care time (minutes):  35     Medications Ordered in ED Medications  0.9 %  sodium chloride infusion ( Intravenous New Bag/Given 07/17/21 0954)  naloxone [facility]) injection 0.4 mg (0.4 mg Intravenous Given 07/17/21 0953)    ED Course/ Medical Decision Making/ A&P Clinical Course as of 07/17/21 1254  Fri Jul 17, 2021  1126 Patient became more alert and started answering questions after she was given the Narcan.  Patient complained of pain in her head after hitting her head.  She still was somewhat agitated  [JK]  1159 Rapid urine drug screen (hospital performed) Drug screen negative [JK]  1159 Urinalysis, Routine w reflex microscopic Urine, In & Out Cath(!) Urinalysis negative [JK]  1200 CBC with Differential/Platelet(!) Hemoglobin decreased compared to previous values [JK]  1200 Comprehensive metabolic panel(!) No significant electrolyte abnormalities [JK]  1200 CT HEAD WO CONTRAST Head CT without acute changes [JK]  1207 Patient remains somnolent now  that Narcan has worn off [JK]  1209  I-Stat venous blood gas, [facility] ED only)(!) Venous blood gas without signs of acidosis or hypercarbia [JK]  1254 Discussed with medical service regarding admission. [JK]    Clinical Course User Index [JK] Dorie Rank, MD                           Medical Decision Making Problems Addressed: Altered mental status, unspecified altered mental status type: acute illness or injury that poses a threat to life or bodily functions Chronic prescription opiate use: chronic illness or injury with exacerbation, progression, or side effects of treatment  Amount and/or Complexity of Data Reviewed Labs: ordered. Decision-making details documented in ED Course. Radiology: ordered. Decision-making details documented in ED Course.  Risk Prescription drug management. Decision regarding hospitalization.   Patient presented to the ED for evaluation of altered mental status.  Initially very somnolent and minimally responsive.  Patient was given a dose of Narcan and she became more alert started answering questions.  ED work-up does not show any signs of acute abnormalities on her head CT.  She is not hyponatremic or uremic.  No signs of respiratory acidosis.  Patient is afebrile does not have a leukocytosis.  Doubt meningitis encephalitis.  Suspect her main issue is related to her opiate use and possibly other ingestion although her UDS is negative.  Patient did have a response to Narcan but as the Narcan is worn off she is more somnolent again.  Protecting airway, no hypoxia, no indication for airway intervention at this time.  I do think the patient would benefit from hospitalization close monitoring.  I will consult the medical service for admission        Final Clinical Impression(s) / ED Diagnoses Final diagnoses:  Altered mental status, unspecified altered mental status type  Chronic prescription opiate use    Rx / DC Orders ED Discharge Orders     None          Dorie Rank, MD 07/17/21 1254
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
## 2021-07-18 ENCOUNTER — Observation Stay (HOSPITAL_COMMUNITY): Payer: Medicare Other

## 2021-07-18 ENCOUNTER — Encounter (HOSPITAL_COMMUNITY): Payer: Self-pay | Admitting: Internal Medicine

## 2021-07-18 DIAGNOSIS — I951 Orthostatic hypotension: Secondary | ICD-10-CM

## 2021-07-18 DIAGNOSIS — R4182 Altered mental status, unspecified: Secondary | ICD-10-CM | POA: Diagnosis not present

## 2021-07-18 DIAGNOSIS — Z79891 Long term (current) use of opiate analgesic: Secondary | ICD-10-CM

## 2021-07-18 DIAGNOSIS — M5002 Cervical disc disorder with myelopathy, mid-cervical region, unspecified level: Secondary | ICD-10-CM | POA: Diagnosis not present

## 2021-07-18 DIAGNOSIS — G9340 Encephalopathy, unspecified: Secondary | ICD-10-CM

## 2021-07-18 DIAGNOSIS — M0680 Other specified rheumatoid arthritis, unspecified site: Secondary | ICD-10-CM

## 2021-07-18 LAB — BASIC METABOLIC PANEL
Anion gap: 7 (ref 5–15)
BUN: 12 mg/dL (ref 8–23)
CO2: 23 mmol/L (ref 22–32)
Calcium: 8.1 mg/dL — ABNORMAL LOW (ref 8.9–10.3)
Chloride: 109 mmol/L (ref 98–111)
Creatinine, Ser: 0.82 mg/dL (ref 0.44–1.00)
GFR, Estimated: >60 mL/min (ref >60)
Glucose, Bld: 93 mg/dL (ref 70–99)
Potassium: 4.1 mmol/L (ref 3.5–5.1)
Sodium: 139 mmol/L (ref 135–145)

## 2021-07-18 LAB — CBC
HCT: 31.5 % — ABNORMAL LOW (ref 36.0–46.0)
Hemoglobin: 10.6 g/dL — ABNORMAL LOW (ref 12.0–15.0)
MCH: 30.2 pg (ref 26.0–34.0)
MCHC: 33.7 g/dL (ref 30.0–36.0)
MCV: 89.7 fL (ref 80.0–100.0)
Platelets: 253 10*3/uL (ref 150–400)
RBC: 3.51 MIL/uL — ABNORMAL LOW (ref 3.87–5.11)
RDW: 14.2 % (ref 11.5–15.5)
WBC: 6.6 10*3/uL (ref 4.0–10.5)
nRBC: 0 % (ref 0.0–0.2)

## 2021-07-18 MED ORDER — OXYCODONE-ACETAMINOPHEN 10-325 MG PO TABS: 1.0000 | ORAL_TABLET | ORAL | 0 refills | Status: AC | PRN

## 2021-07-18 MED ORDER — DICLOFENAC SODIUM 1 % EX GEL: 2.0000 g | Freq: Four times a day (QID) | CUTANEOUS | 2 refills | Status: AC | PRN

## 2021-07-18 NOTE — Discharge Instructions (Addendum)
It was a pleasure taking care of you at [facility] were admitted for mental status and treated for severe pain and orthostatic hypotension. We are discharging you home now that you are doing better. Please follow the following instructions.   1) Please take your medications as prescribed.  Importantly, stop taking your amitriptyline as we believe this was a contributing factor to your illness and hospitalization.  Also, use extreme caution when taking opioid pain medication, as it can cause fatal respiratory depression.  2) You have been prescribed a 5-day course of opioid pain medicine.  Please make an appointment with your pain management physician as soon as you can.  3) Please make an appointment with your primary care physician as soon as possible so they can support your continued recovery.  4) You may want to use a brace for your ankle and/or knee to help with pain and mobility upon leaving the hospital.  These can be obtained from most drug stores.  Please return to the hospital if you experience the signs and symptoms of opioid overdose, including but not limited to difficulty breathing, severe drowsiness, or confusion.   Best of luck in your continued recovery.  Take care,  Dr. Nani Gasser, MD

## 2021-07-18 NOTE — Evaluation (Signed)
Occupational Therapy Evaluation Patient Details Name: [name] MRN: [id number] DOB: [birth year] Today's Date: 07/18/2021   History of Present Illness 66 y/o female presented to ED on 07/17/21 for AMS and fall. Admitted for acute encephalopathy. PMH: chronic opioid use, hx of cervical myelopathy, RA, CKD, HTN   Clinical Impression   PTA pt lives alone in a senior living apt and has an aide 3x/wk for 3 hrs/day to help with IADL tasks. At baseline, pt uses a rollator for mobility and is able to complete her ADL tasks.  Pt's daughter assists with groceries and "checks on her". Pt reports multiple falls. States she has been home from rehab for 2 months. Pt complaining of pain throughout session. New onset of R ankle pain since fall - MD notified. Recommend DC home with [facility] [place] with frequent S, if this is not available, pt may need SNF. Discussed most likely need for ALF in the future. Acute OT to follow.      Recommendations for follow up therapy are one component of a multi-disciplinary discharge planning process, led by the attending physician.  Recommendations may be updated based on patient status, additional functional criteria and insurance authorization.   Follow Up Recommendations  Home health OT    Assistance Recommended at Discharge Frequent or constant Supervision/Assistance  Patient can return home with the following A little help with walking and/or transfers;A little help with bathing/dressing/bathroom;Assistance with cooking/housework;Direct supervision/assist for medications management;Assist for transportation    Functional Status Assessment  Patient has had a recent decline in their functional status and demonstrates the ability to make significant improvements in function in a reasonable and predictable amount of time.  Equipment Recommendations  None recommended by OT    Recommendations for Other Services       Precautions / Restrictions Precautions Precautions: Fall       Mobility Bed Mobility Overal bed mobility: Needs Assistance Bed Mobility: Supine to Sit     Supine to sit: Min guard          Transfers Overall transfer level: Needs assistance Equipment used: Rolling walker (2 wheels) Transfers: Sit to/from Stand Sit to Stand: Min guard                  Balance Overall balance assessment: History of Falls, Needs assistance   Sitting balance-Leahy Scale: Good       Standing balance-Leahy Scale: Poor                             ADL either performed or assessed with clinical judgement   ADL Overall ADL's : Needs assistance/impaired     Grooming: Set up;Sitting   Upper Body Bathing: Set up;Sitting   Lower Body Bathing: Min guard;Sit to/from stand   Upper Body Dressing : Set up   Lower Body Dressing: Min guard Lower Body Dressing Details (indicate cue type and reason): states she is unable to donn socks at baseline however was able to pick up her pad from the floor Toilet Transfer: Min guard;Ambulation;Rolling walker (2 wheels)   Toileting- Clothing Manipulation and Hygiene: Minimal assistance Toileting - Clothing Manipulation Details (indicate cue type and reason): urinary incontinence at baseline; sitting in urine soaked bed     Functional mobility during ADLs: Min guard;Rolling walker (2 wheels);Cueing for safety  Issued red tubing for utensils/toothbrush     Vision         Perception     Praxis  Pertinent Vitals/Pain Pain Assessment Pain Assessment: 0-10 Pain Score: 8  Pain Location: B hnads, feet, knees Pain Descriptors / Indicators: Discomfort, Grimacing, Guarding, Moaning Pain Intervention(s): Limited activity within patient's tolerance     Hand Dominance Right   Extremity/Trunk Assessment Upper Extremity Assessment Upper Extremity Assessment: Generalized weakness (B hand numbness and decreased grip strength @ 3+/5 throughout; drops items frequently)   Lower Extremity  Assessment Lower Extremity Assessment: Defer to PT evaluation   Cervical / Trunk Assessment Cervical / Trunk Assessment: Kyphotic;Other exceptions (hx of cervical myelopathy)   Communication Communication Communication: No difficulties   Cognition Arousal/Alertness: Awake/alert Behavior During Therapy: WFL for tasks assessed/performed Overall Cognitive Status: No family/caregiver present to determine baseline cognitive functioning                                 General Comments: most likely at baseline; tangential     General Comments  States she had 3 falls on the day of admission, said she could not get up by herself; found wondering around her apt; States she fell 20 times in 3 month period, then said only 10 times    Exercises  Squeeze ball for grip/pinch strengthening   Shoulder Instructions      Home Living Family/patient expects to be discharged to:: Private residence (Independent Living Apt) Living Arrangements: Alone Available Help at Discharge: Family;Personal care attendant;Available PRN/intermittently Type of Home: Independent living facility Home Access: Level entry;Elevator     Home Layout: One level     Bathroom Shower/Tub: Occupational psychologist: Standard Bathroom Accessibility: Yes How Accessible: Accessible via walker Home Equipment: Rollator (4 wheels);Cane - single point;Shower seat;Grab bars - tub/shower;Hand held shower head;Grab bars - toilet;Hospital bed          Prior Functioning/Environment Prior Level of Function : Independent/Modified Independent;Needs assist       Physical Assist : ADLs (physical)   ADLs (physical): IADLs (PCA helps 3 days/wk; 3 hours/day with IADL tasks)            OT Problem List: Impaired balance (sitting and/or standing);Decreased strength;Decreased safety awareness;Decreased coordination;Decreased knowledge of use of DME or AE;Impaired sensation;Impaired UE functional use;Pain       OT Treatment/Interventions: Self-care/ADL training;Therapeutic exercise;Neuromuscular education;DME and/or AE instruction;Therapeutic activities;Patient/family education;Balance training    OT Goals(Current goals can be found in the care plan section) Acute Rehab OT Goals Patient Stated Goal: to get more help OT Goal Formulation: With patient Time For Goal Achievement: 08/01/21 Potential to Achieve Goals: Good  OT Frequency: Min 2X/week    Co-evaluation PT/OT/SLP Co-Evaluation/Treatment: Yes (partial session)     OT goals addressed during session: ADL's and self-care      AM-PAC OT "6 Clicks" Daily Activity     Outcome Measure Help from another person eating meals?: A Little Help from another person taking care of personal grooming?: A Little Help from another person toileting, which includes using toliet, bedpan, or urinal?: A Little Help from another person bathing (including washing, rinsing, drying)?: A Little Help from another person to put on and taking off regular upper body clothing?: A Little Help from another person to put on and taking off regular lower body clothing?: A Little 6 Click Score: 18   End of Session Equipment Utilized During Treatment: Gait belt;Rolling walker (2 wheels) Nurse Communication: Mobility status  Activity Tolerance: Patient tolerated treatment well Patient left: in chair;with call bell/phone within reach;with chair  alarm set  OT Visit Diagnosis: Unsteadiness on feet (R26.81);Other abnormalities of gait and mobility (R26.89);Repeated falls (R29.6)                Time: 3790-2409 OT Time Calculation (min): 39 min Charges:  OT General Charges $OT Visit: 1 Visit OT Evaluation $OT Eval Moderate Complexity: [place], OT/L   Acute OT Clinical Specialist Acute Rehabilitation Services Pager [phone] Office [phone]   [facility] 07/18/2021, 9:32 AM

## 2021-07-18 NOTE — Progress Notes (Signed)
Discharge instructions given to patient. PIV d/cd.  Patient's walker brought by patient's family member; stated he will meet her downstairs in ED and they will ride taxi together. [employer] called.  Patient transported to ED via wheelchair by staff; all belongings sent with patient.

## 2021-07-18 NOTE — Discharge Summary (Addendum)
Name: [name] MRN: [id number] DOB: 08-05-1955 66 y.o. PCP: System, Provider Not In  Date of Admission: 07/17/2021  9:27 AM Date of Discharge: 07/18/21 Attending Physician: Sid Falcon, MD  Discharge Diagnosis: 1. Principal Problem:   Altered mental status Active Problems:   Orthostatic hypotension   Acute encephalopathy  Discharge Medications: Allergies as of 07/18/2021       Reactions   Iodine-131 Anaphylaxis   Neck tightens up   Iohexol Anaphylaxis   Levofloxacin Other (See Comments)   BASELINE PROLONGED QTc.     Zofran [ondansetron Hcl] Other (See Comments)   BASELINE PROLONGED QTc.   Heparin Itching, Other (See Comments)   Pt is able to tolerate IV heparin, but not sub-Q.   Morphine And Related Itching        Medication List     STOP taking these medications    amitriptyline 50 MG tablet Commonly known as: ELAVIL   cephALEXin 500 MG capsule Commonly known as: KEFLEX   cetirizine 10 MG tablet Commonly known as: ZYRTEC   cyanocobalamin 6812 MCG tablet   folic acid 1 MG tablet Commonly known as: FOLVITE   oxyCODONE 5 MG immediate release tablet Commonly known as: Roxicodone       TAKE these medications    albuterol 108 (90 Base) MCG/ACT inhaler Commonly known as: VENTOLIN HFA Inhale 1-2 puffs into the lungs every 6 (six) hours as needed for wheezing or shortness of breath.   amLODipine 10 MG tablet Commonly known as: NORVASC Take 10 mg by mouth every morning.   atorvastatin 40 MG tablet Commonly known as: LIPITOR Take 1 tablet (40 mg total) by mouth daily at 6 PM. What changed: when to take this   baclofen 10 MG tablet Commonly known as: LIORESAL Take 10 mg by mouth 3 (three) times daily as needed for muscle spasms.   Breo Ellipta 100-25 MCG/ACT Aepb Generic drug: fluticasone furoate-vilanterol Inhale 1 puff into the lungs daily as needed (for shortness of breath).   diclofenac Sodium 1 % Gel Commonly known as:  VOLTAREN Apply 2 g topically 4 (four) times daily as needed (joint pain).   DULoxetine 30 MG capsule Commonly known as: CYMBALTA Take 30 mg by mouth daily.   fluticasone 50 MCG/ACT nasal spray Commonly known as: FLONASE Place 1 spray into both nostrils daily as needed for allergies.   gabapentin 400 MG capsule Commonly known as: NEURONTIN Take 400-800 mg by mouth at bedtime.   lidocaine 5 % Commonly known as: Lidoderm Place 1 patch onto the skin daily as needed. Remove & Discard patch within 12 hours or as directed by MD   linaclotide 145 MCG Caps capsule Commonly known as: LINZESS Take 145 mcg by mouth daily before breakfast.   oxyCODONE-acetaminophen 10-325 MG tablet Commonly known as: PERCOCET Take 1 tablet by mouth every 4 (four) hours as needed for up to 5 days for pain.   pantoprazole 40 MG tablet Commonly known as: PROTONIX Take 1 tablet (40 mg total) by mouth 2 (two) times daily before a meal. What changed: when to take this               Durable Medical Equipment  (From admission, onward)           Start     Ordered   07/18/21 1601  For home use only DME Walker rolling  Once       Question Answer Comment  Walker: With 5 Inch Wheels   Patient needs  a walker to treat with the following condition Weakness      07/18/21 1600            Disposition and follow-up:   Ms.[name] was discharged from [facility] in Good condition.  She is a 66 year old female with history of cervical myelopathy, rheumatoid arthritis, and chronic prescription opioid use who presented to the hospital after several falls at home, lightheadedness, severe pain, and who was admitted for the treatment of orthostatic hypotension and severe chronic pain.  At the hospital follow up visit please address:  Orthostatic hypotension - Check orthostatic vital signs - Home amitriptyline was held during hospitalization and subsequently discontinued on discharge  due to its tendency to cause orthostatic hypotension in the elderly. - Repeat CBC and BMP  Chronic prescription opioid use for cervical myelopathy and rheumatoid arthritis - Patient was prescribed 5-day supply of Percocet as bridge to get her to her next pain management appointment - Follow-up evaluation of right knee and right ankle, which were both injured in the fall.  Labs / imaging needed at time of follow-up: - CBC - BMP  Follow-up Appointments:  Follow-up Information     Jettie Booze, MD. Call in 1 week(s).   Specialties: Cardiology, Radiology, Interventional Cardiology Contact information: [address] [place] [address] [place] Follow up.   Why: PT/OT services. Agency will call you to schedule. [facility] of[facility] telephone number is [phone]                Hospital Course by problem list:   Altered mental status Orthostatic hypotension Patient presented to somnolent ED after several falls preceded by lightheadedness.  Prior to this episode she had several days of nausea, abdominal discomfort, and diarrhea.  Work-up was negative for infection, intracranial hemorrhage or CVA, cardiac arrhythmia or ACS.  She was treated with normal saline infusion with improvement in her symptoms by hospital day 2.  Electrolytes remained within normal limits.  She was discharged in good condition after 1 night in the hospital.  Chronic prescription opioid use due to chronic pain from cervical myelopathy and rheumatoid arthritis Patient's chief complaint at time of admission was severe pain in right side of neck and right knee.  She also complained of pain in hands (due to rheumatoid arthritis) and pain in right ankle.  X-ray of right knee and right ankle were negative for osseous abnormality.  She says that she is without her home Percocet because an aide knocked over the bottle while they were cleaning and  the pills were unrecoverable.  Initially there was concern for opioid overdose given patient's somnolent presentation and good response to Narcan per ED.  However urine toxicology was negative for opioids and patient denies taking synthetic opioids that are undetected by U tox.  Her pain was treated with home dose of Percocet to good effect.  She was prescribed a 5-day course of Percocet for pain and to avoid precipitating withdrawal in interim time between discharge from hospital and her appointment with her pain management physician.  Discharge Exam:   BP (!) 139/59 (BP Location: Left Arm)   Pulse 77   Temp 98.8 F (37.1 C) (Oral)   Resp 14   Ht '5\' 6"'$  (1.676 m)   Wt 65 kg   SpO2 97%   BMI 23.13 kg/m  General: Comfortable appearing female laying in bed Cardiovascular: Regular rate and  rhythm Respiratory: Normal work of breathing noted Extremities: Left radial pulse 2+ Skin: Warm and dry.  No rashes or lesions on visible skin. Neuro: Alert and oriented x4.  Moves all extremities. Psych: Appropriate mood and affect for situation.  Pertinent Labs, Studies, and Procedures:   Latest Reference Range & Units 07/17/21 09:58  pH, Ven 7.25 - 7.43  7.442 (H)  pCO2, Ven 44 - 60 mmHg 34.5 (L)  pO2, Ven 32 - 45 mmHg 164 (H)  TCO2 22 - 32 mmol/L 25  Acid-Base Excess 0.0 - 2.0 mmol/L 0.0  Bicarbonate 20.0 - 28.0 mmol/L 23.5  O2 Saturation % 100  (H): Data is abnormally high (L): Data is abnormally low   Latest Reference Range & Units 07/18/21 01:56  Sodium 135 - 145 mmol/L 139  Potassium 3.5 - 5.1 mmol/L 4.1  Chloride 98 - 111 mmol/L 109  CO2 22 - 32 mmol/L 23  Glucose 70 - 99 mg/dL 93  BUN 8 - 23 mg/dL 12  Creatinine 0.44 - 1.00 mg/dL 0.82  Calcium 8.9 - 10.3 mg/dL 8.1 (L)  Anion gap 5 - 15  7  (L): Data is abnormally low   Latest Reference Range & Units 07/18/21 01:56  WBC 4.0 - 10.5 K/uL 6.6  RBC 3.87 - 5.11 MIL/uL 3.51 (L)  Hemoglobin 12.0 - 15.0 g/dL 10.6 (L)  HCT 36.0 - 46.0 %  31.5 (L)  MCV 80.0 - 100.0 fL 89.7  MCH 26.0 - 34.0 pg 30.2  MCHC 30.0 - 36.0 g/dL 33.7  RDW 11.5 - 15.5 % 14.2  Platelets 150 - 400 K/uL 253  nRBC 0.0 - 0.2 % 0.0  (L): Data is abnormally low   Latest Reference Range & Units 07/17/21 09:44  Appearance CLEAR  CLEAR  Bilirubin Urine NEGATIVE  NEGATIVE  Color, Urine YELLOW  STRAW !  Glucose, UA NEGATIVE mg/dL NEGATIVE  Hgb urine dipstick NEGATIVE  SMALL !  Ketones, ur NEGATIVE mg/dL NEGATIVE  Leukocytes,Ua NEGATIVE  TRACE !  Nitrite NEGATIVE  NEGATIVE  pH 5.0 - 8.0  5.0  Protein NEGATIVE mg/dL NEGATIVE  Specific Gravity, Urine 1.005 - 1.030  1.004 (L)  !: Data is abnormal (L): Data is abnormally low   Latest Reference Range & Units 07/17/21 09:44  Amphetamines NONE DETECTED  NONE DETECTED  Barbiturates NONE DETECTED  NONE DETECTED  Benzodiazepines NONE DETECTED  NONE DETECTED  Opiates NONE DETECTED  NONE DETECTED  COCAINE NONE DETECTED  NONE DETECTED  Tetrahydrocannabinol NONE DETECTED  NONE DETECTED   Imaging: CT head without contrast: Stable chronic changes with no acute intracranial process Right ankle 2 view: No acute fracture or dislocation Right knee 2 view: No acute osseous abnormality.  Moderate joint effusion  Discharge Instructions:   Discharge Instructions      It was a pleasure taking care of you at [place] were admitted for mental status and treated for severe pain and orthostatic hypotension. We are discharging you home now that you are doing better. Please follow the following instructions.   1) Please take your medications as prescribed.  Importantly, stop taking your amitriptyline as we believe this was a contributing factor to your illness and hospitalization.  Also, use extreme caution when taking opioid pain medication, as it can cause fatal respiratory depression.  2) You have been prescribed a 5-day course of opioid pain medicine.  Please make an appointment with your pain management  physician as soon as you can.  3) Please make an appointment  with your primary care physician as soon as possible so they can support your continued recovery.  4) You may want to use a brace for your ankle and/or knee to help with pain and mobility upon leaving the hospital.  These can be obtained from most drug stores.  Please return to the hospital if you experience the signs and symptoms of opioid overdose, including but not limited to difficulty breathing, severe drowsiness, or confusion.   Best of luck in your continued recovery.  Take care,  Dr. Nani Gasser, MD     Signed: Nani Gasser, MD 07/18/2021, 4:14 PM   Pager: [phone]

## 2021-07-18 NOTE — TOC Transition Note (Signed)
Transition of Care [facility]) - CM/SW Discharge Note   Patient Details  Name: [name] MRN: [id number] Date of Birth: 1955-07-25  Transition of Care [facility]) CM/SW Contact:  Konrad Penta, RN Phone Number: [phone] 07/18/2021, 3:56 PM   Clinical Narrative:  Spoke with patient who endorses she lives in [facility] [place]. Agreeable to home health. No preference. [facility] arranged for PT/OT services. Angie with [facility] able to accept referral. Therapy recommends rolling walker. Spoke with Mardene Celeste with [employer] who states patient received rollator in January 2023. Any other walking devices would be out of pocket. Patient confirmed she has rolling walker at home. Patient trying to find ride home. Unable to find ride. Will need taxi cab voucher. Patient called stepbrother and states the stepbrother will walk from [facility] (down the street from hospital)  with a rolling walker and will ride home with patient in the cab to ensure safety.   Will take taxi cab voucher to nursing unit.     Final next level of care: [place] Barriers to Discharge: No Barriers Identified   Patient Goals and CMS Choice Patient states their goals for this hospitalization and ongoing recovery are:: return home CMS Medicare.gov Compare Post Acute Care list provided to:: Patient    Discharge Placement                       Discharge Plan and Services                  DME Agency:  [facility])       HH Arranged: PT, OT HH Agency:  [facility]) Date [place]: 07/18/21 Time [place]: 0569 Representative spoke with at [place]: [place] Interventions     Readmission Risk Interventions    04/05/2021    5:04 PM 11/13/2020   10:26 AM 11/10/2020   11:54 AM  Readmission Risk Prevention Plan  Transportation Screening Complete Complete Complete  PCP or Specialist Appt within 5-7 Days   Complete   PCP or Specialist Appt within 3-5 Days  Complete   Home Care Screening   Complete  Medication Review (RN CM)   Complete  HRI or Home Care Consult  Complete   Social Work Consult for [place] Planning/Counseling  Complete   Palliative Care Screening  Complete   Medication Review Press photographer) Complete Complete   PCP or Specialist appointment within 3-5 days of discharge Complete    HRI or [place] Complete    SW Recovery Care/Counseling Consult Complete    Palliative Care Screening Complete    Skilled Nursing Facility Complete

## 2021-07-18 NOTE — Evaluation (Signed)
Physical Therapy Evaluation Patient Details Name: [name] MRN: [id number] DOB: Dec 31, 1955 Today's Date: 07/18/2021  History of Present Illness  66 y/o female presented to ED on 07/17/21 for AMS and fall. Admitted for acute encephalopathy. PMH: chronic opioid use, hx of cervical myelopathy, RA, CKD, HTN  Clinical Impression  Patient admitted with the above. PTA, patient living at senior living apartment where she uses a rollator for mobility and has aide 3x/week for 3hrs/day for iADLs. Patient reports 20 falls in past 3 months with 3 being on day of admission. Patient currently presents with weakness, R ankle pain, impaired balance, and decreased safety awareness. Patient overall min guard for mobility with use of RW due to R ankle pain. Patient will benefit from skilled PT services during acute stay to address listed deficits. Recommend HHPT at discharge with frequent supervision/assistance, if not available, she may need SNF to improve balance and prevent future falls.      Recommendations for follow up therapy are one component of a multi-disciplinary discharge planning process, led by the attending physician.  Recommendations may be updated based on patient status, additional functional criteria and insurance authorization.  Follow Up Recommendations Home health PT      Assistance Recommended at Discharge Frequent or constant Supervision/Assistance  Patient can return home with the following  A little help with walking and/or transfers;A little help with bathing/dressing/bathroom;Direct supervision/assist for medications management;Direct supervision/assist for financial management;Assist for transportation    Equipment Recommendations Rolling Nikitha Mode (2 wheels) (patient prefers to use her rollator despite education)  Recommendations for Other Services       Functional Status Assessment Patient has had a recent decline in their functional status and demonstrates the ability to make  significant improvements in function in a reasonable and predictable amount of time.     Precautions / Restrictions Precautions Precautions: Fall Restrictions Weight Bearing Restrictions: No      Mobility  Bed Mobility Overal bed mobility: Needs Assistance Bed Mobility: Supine to Sit     Supine to sit: Min guard          Transfers Overall transfer level: Needs assistance Equipment used: Rolling Tanae Petrosky (2 wheels) Transfers: Sit to/from Stand Sit to Stand: Min guard                Ambulation/Gait Ambulation/Gait assistance: Min guard Gait Distance (Feet): 10 Feet (+10') Assistive device: Rolling Shanise Balch (2 wheels) Gait Pattern/deviations: Step-to pattern, Decreased stride length, Decreased stance time - right Gait velocity: decreased     General Gait Details: min guard for safety. Patient with guarded gait pattern due to R ankle pain. No overt LOB  Stairs            Wheelchair Mobility    Modified Rankin (Stroke Patients Only)       Balance Overall balance assessment: History of Falls, Needs assistance   Sitting balance-Leahy Scale: Good       Standing balance-Leahy Scale: Poor                               Pertinent Vitals/Pain Pain Assessment Pain Assessment: 0-10 Pain Location: B hnads, feet, knees Pain Descriptors / Indicators: Discomfort, Grimacing, Guarding, Moaning Pain Intervention(s): Limited activity within patient's tolerance    Home Living Family/patient expects to be discharged to:: Private residence (Independent Living Apt) Living Arrangements: Alone Available Help at Discharge: Family;Personal care attendant;Available PRN/intermittently Type of Home: Independent living facility Home Access: Level entry;Elevator  Home Layout: One level Home Equipment: Rollator (4 wheels);Cane - single point;Shower seat;Grab bars - tub/shower;Hand held shower head;Grab bars - toilet;Hospital bed      Prior Function  Prior Level of Function : Independent/Modified Independent;Needs assist       Physical Assist : ADLs (physical)   ADLs (physical): IADLs (PCA helps 3 days/wk; 3 hours/day with IADL tasks) Mobility Comments: ambulates with rollator. reporting 20 falls in past 3 months. 3 on day of admission       Hand Dominance   Dominant Hand: Right    Extremity/Trunk Assessment   Upper Extremity Assessment Upper Extremity Assessment: Defer to OT evaluation    Lower Extremity Assessment Lower Extremity Assessment: RLE deficits/detail RLE Deficits / Details: complaining of R ankle pain. Increases with inversion and DF RLE: Unable to fully assess due to pain    Cervical / Trunk Assessment Cervical / Trunk Assessment: Kyphotic;Other exceptions (hx of cervical myelopathy)  Communication   Communication: No difficulties  Cognition Arousal/Alertness: Awake/alert Behavior During Therapy: WFL for tasks assessed/performed Overall Cognitive Status: No family/caregiver present to determine baseline cognitive functioning                                 General Comments: most likely at baseline; tangential        General Comments General comments (skin integrity, edema, etc.): States she had 3 falls on the day of admission, said she could not get up by herself; found wondering around her apt; States she fell 20 times in 3 month period, then said only 10 times    Exercises     Assessment/Plan    PT Assessment Patient needs continued PT services  PT Problem List Decreased strength;Decreased balance;Decreased activity tolerance;Decreased mobility;Decreased cognition;Decreased safety awareness;Decreased knowledge of precautions       PT Treatment Interventions DME instruction;Gait training;Functional mobility training;Therapeutic activities;Therapeutic exercise;Balance training;Patient/family education    PT Goals (Current goals can be found in the Care Plan section)  Acute Rehab  PT Goals Patient Stated Goal: to go home PT Goal Formulation: With patient Time For Goal Achievement: 08/01/21 Potential to Achieve Goals: Good    Frequency Min 3X/week     Co-evaluation PT/OT/SLP Co-Evaluation/Treatment: Yes Reason for Co-Treatment: For patient/therapist safety;To address functional/ADL transfers PT goals addressed during session: Mobility/safety with mobility OT goals addressed during session: ADL's and self-care       AM-PAC PT "6 Clicks" Mobility  Outcome Measure Help needed turning from your back to your side while in a flat bed without using bedrails?: A Little Help needed moving from lying on your back to sitting on the side of a flat bed without using bedrails?: A Little Help needed moving to and from a bed to a chair (including a wheelchair)?: A Little Help needed standing up from a chair using your arms (e.g., wheelchair or bedside chair)?: A Little Help needed to walk in hospital room?: A Little Help needed climbing 3-5 steps with a railing? : A Lot 6 Click Score: 17    End of Session Equipment Utilized During Treatment: Gait belt Activity Tolerance: Patient limited by pain Patient left: in chair;with call bell/phone within reach;with chair alarm set Nurse Communication: Mobility status PT Visit Diagnosis: Muscle weakness (generalized) (M62.81);Unsteadiness on feet (R26.81);Other abnormalities of gait and mobility (R26.89);Pain;History of falling (Z91.81) Pain - Right/Left: Right Pain - part of body: Ankle and joints of foot    Time: 9381-0175 PT Time  Calculation (min) (ACUTE ONLY): 34 min   Charges:   PT Evaluation $PT Eval Moderate Complexity: 1 Mod          Katina Remick A. Gilford Rile PT, DPT Acute Rehabilitation Services Office [phone]   Linna Hoff 07/18/2021, 9:52 AM

## 2021-07-18 NOTE — Progress Notes (Signed)
  Date: 07/18/2021  Patient name: [name]  Medical record number: [id number]  Date of birth: May 29, 1955   I have seen and evaluated [name] and discussed their care with the Residency Team. Briefly, [name] is a 66 year old woman who came in related to chronic pain, mainly in the knee on the right, falls at home, and unexplained encephalopathy.  She is improved today when we saw her.  Notes being out of pain medication for a while due to spilling some of them.  She is back to her baseline mental status this morning, continues to have pain.    Vitals:   07/18/21 0800 07/18/21 1116  BP: 140/76 (!) 141/79  Pulse: 70 73  Resp: 18   Temp: 98.2 F (36.8 C) 98.1 F (36.7 C)  SpO2:  99%   Gen: Awake, alert, no distress Eyes; Anicteric sclerae HENT: Neck is supple CV: RR, NR, no murmur Pulm: CTAB, no wheezing Abd: Soft, +BS MSK: TTP over right knee and right ankle to light touch and to palpation.  LImited ROM tested due to pain.  Psych: Appears fatigued.   Assessment and Plan: I have seen and evaluated the patient as outlined above. I agree with the formulated Assessment and Plan as detailed in the residents' note, with the following changes:   It is unclear what caused her encephalopathy as she notes not having her narcotics.  She has had falls, so presumably withdrawal could be playing a part, but she improved with narcan.  Overall, unclear picture, but she is back to baseline today.  She complains of pain.  Xrays did not reveal fractures and her pain is largely chronic.  She will be initiated back on her home regimen and if she does well today will be discharged back to ALF.   Sid Falcon, MD 7/8/20231:53 PM

## 2021-09-29 ENCOUNTER — Telehealth: Payer: Self-pay

## 2021-09-29 NOTE — Telephone Encounter (Signed)
-----   Message from Peggyann Shoals sent at 09/29/2021  1:40 PM EDT ----- Regarding: sx date Contact: [phone] Patient is calling that she is ready to set up surgery. She is falling more. She has been cleared for surgery from ENT. Please call her. 2nd number to try [phone]

## 2021-09-29 NOTE — Telephone Encounter (Signed)
Patient confirmed appt for 10/06/2021.

## 2021-09-29 NOTE — Telephone Encounter (Signed)
Dr Darreld Mclean said he would like to see her again before scheduling surgery. OK to use a new pt slot per CKY

## 2021-10-06 ENCOUNTER — Ambulatory Visit (INDEPENDENT_AMBULATORY_CARE_PROVIDER_SITE_OTHER): Payer: Medicare Other | Admitting: Neurosurgery

## 2021-10-06 ENCOUNTER — Encounter: Payer: Self-pay | Admitting: Neurosurgery

## 2021-10-06 VITALS — BP 131/72 | HR 86 | Ht 66.5 in | Wt 155.2 lb

## 2021-10-06 DIAGNOSIS — M4802 Spinal stenosis, cervical region: Secondary | ICD-10-CM | POA: Diagnosis not present

## 2021-10-06 DIAGNOSIS — G959 Disease of spinal cord, unspecified: Secondary | ICD-10-CM | POA: Diagnosis not present
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
## 2021-10-06 NOTE — Patient Instructions (Signed)
Please see below for information in regards to your upcoming surgery:  Planned surgery: C3-4 anterior cervical discectomy and fusion   Surgery date: 11/02/2021 - you will find out your arrival time the business day before your surgery.   NSAIDS (Non-steroidal anti-inflammatory drugs): because you are having a fusion, no NSAIDS (such as ibuprofen, aleve, naproxen, meloxicam, diclofenac) for 3 months after surgery. Celebrex is an exception. Tylenol is ok because it is not an NSAID.   Pre-op appointment at [facility]: we will call you with a date/time for this. Pre-admit testing is located on the first floor of the [facility], [place], [address].   Pre-op labs may be done at your pre-op appointment. You are not required to fast for these labs.    Should you need to change your pre-op appointment, please call Pre-admit testing at [phone].      Home health physical therapy: [facility] [place] will contact you regarding home health physical therapy for after surgery.Their number is [phone].  Because you are having a fusion: for appointments after your 2 week follow-up: please arrive at the [facility] outpatient imaging center ([place], [place]) or [facility] one hour prior to your appointment for x-rays. This applies to every appointment after your 2 week follow-up. Failure to do so may result in your appointment being rescheduled.   If you have FMLA/disability paperwork, please drop it off or fax it to [phone], attention [place].   If you have any questions/concerns before or after surgery, you can reach [place] at [phone], or you can send a mychart message. If you have a concern after hours that cannot wait until normal business hours, you can call [phone] or [phone] and ask the answering service to page the neurosurgeon on  call.     Appointments/FMLA & disability paperwork: [place] Nurse: Ophelia Shoulder  Medical assistant: Raquel Sarna Physician Assistant's: Cooper Render & Geronimo Boot Surgeon: Meade Maw, MD

## 2021-10-07 ENCOUNTER — Telehealth: Payer: Self-pay

## 2021-10-07 ENCOUNTER — Other Ambulatory Visit: Payer: Self-pay

## 2021-10-07 DIAGNOSIS — Z01818 Encounter for other preprocedural examination: Secondary | ICD-10-CM

## 2021-10-07 NOTE — Telephone Encounter (Signed)
I spoke with pt's sister [name] about [employer]. We have mailed them a copy as well.

## 2021-10-07 NOTE — Telephone Encounter (Signed)
Left message for Simona Huh, FNP at [facility] requesting a call back to discuss protocol for post op pain management for pt's upcoming surgery with Dr Izora Ribas.

## 2021-10-07 NOTE — Telephone Encounter (Signed)
  Copy from [facility] has been scanned into pt's chart (see "Media" tab).    [facility] Medical Phone: [phone] [place] Fax: [phone]

## 2021-10-07 NOTE — Telephone Encounter (Signed)
[employer] called - they will send over a copy of their guidelines. I can contact Ms Oyer about this once we receive them

## 2021-10-09 ENCOUNTER — Emergency Department (HOSPITAL_COMMUNITY)
Admission: EM | Admit: 2021-10-09 | Discharge: 2021-10-09 | Disposition: A | Discharge status: Home / Self Care | Payer: Medicare Other

## 2021-10-09 NOTE — ED Notes (Signed)
Pt stating she is leaving when EMS arrived. Pt refusing any vitals. Walked out door.

## 2021-10-09 NOTE — ED Notes (Signed)
Pt states she does not want to wait in triage, walked out of triage doors. Pt was informed that thhis was against medical advice by RN and EMS personnel.

## 2021-10-12 ENCOUNTER — Telehealth: Payer: Self-pay

## 2021-10-12 NOTE — Telephone Encounter (Signed)
-----   Message from Peggyann Shoals sent at 10/12/2021  8:11 AM EDT ----- Regarding: increased pain ER Contact: [phone]  C3-4 ACDF on 11/02/21 Patient left message on v/m 9/30 at 7:45am so I called her back. Patient had to go to [facility] over the weekend because her pain is increasing. She left without being seen because she needed to lay down.  She is having pain in her neck and swelling in her shoulders. If there is anything that can be done sooner please let her know.

## 2021-10-12 NOTE — Telephone Encounter (Signed)
Surgery is approved. So we can move her up if someone cancels between now and then.

## 2021-10-12 NOTE — Telephone Encounter (Signed)
Left message to call back  

## 2021-10-13 NOTE — Telephone Encounter (Signed)
She said that she is feeling better and that she will just keep her surgery for 11/02/2021. Another provider prescribed percocet for her.

## 2021-10-21 ENCOUNTER — Encounter: Payer: Self-pay | Admitting: Neurosurgery

## 2021-10-21 ENCOUNTER — Encounter
Admission: RE | Admit: 2021-10-21 | Discharge: 2021-10-21 | Disposition: A | Discharge status: Home / Self Care | Payer: Medicare Other | Source: Ambulatory Visit | Attending: Neurosurgery | Admitting: Neurosurgery

## 2021-10-21 DIAGNOSIS — Z01812 Encounter for preprocedural laboratory examination: Secondary | ICD-10-CM | POA: Insufficient documentation

## 2021-10-21 DIAGNOSIS — I129 Hypertensive chronic kidney disease with stage 1 through stage 4 chronic kidney disease, or unspecified chronic kidney disease: Secondary | ICD-10-CM | POA: Insufficient documentation

## 2021-10-21 DIAGNOSIS — N1831 Chronic kidney disease, stage 3a: Secondary | ICD-10-CM | POA: Diagnosis not present

## 2021-10-21 DIAGNOSIS — E875 Hyperkalemia: Secondary | ICD-10-CM | POA: Insufficient documentation

## 2021-10-21 DIAGNOSIS — I10 Essential (primary) hypertension: Secondary | ICD-10-CM

## 2021-10-21 HISTORY — DX: Spinal stenosis, site unspecified: M48.00

## 2021-10-21 HISTORY — DX: Carpal tunnel syndrome, unspecified upper limb: G56.00

## 2021-10-21 HISTORY — DX: Disease of spinal cord, unspecified: G95.9

## 2021-10-21 LAB — CBC
HCT: 38.2 % (ref 36.0–46.0)
Hemoglobin: 12.2 g/dL (ref 12.0–15.0)
MCH: 29 pg (ref 26.0–34.0)
MCHC: 31.9 g/dL (ref 30.0–36.0)
MCV: 91 fL (ref 80.0–100.0)
Platelets: 282 10*3/uL (ref 150–400)
RBC: 4.2 MIL/uL (ref 3.87–5.11)
RDW: 15.1 % (ref 11.5–15.5)
WBC: 5 10*3/uL (ref 4.0–10.5)
nRBC: 0 % (ref 0.0–0.2)

## 2021-10-21 LAB — BASIC METABOLIC PANEL
Anion gap: 7 (ref 5–15)
BUN: 11 mg/dL (ref 8–23)
CO2: 23 mmol/L (ref 22–32)
Calcium: 8.5 mg/dL — ABNORMAL LOW (ref 8.9–10.3)
Chloride: 112 mmol/L — ABNORMAL HIGH (ref 98–111)
Creatinine, Ser: 0.84 mg/dL (ref 0.44–1.00)
GFR, Estimated: >60 mL/min (ref >60)
Glucose, Bld: 102 mg/dL — ABNORMAL HIGH (ref 70–99)
Potassium: 3.8 mmol/L (ref 3.5–5.1)
Sodium: 142 mmol/L (ref 135–145)

## 2021-10-21 LAB — SURGICAL PCR SCREEN
MRSA, PCR: NEGATIVE
Staphylococcus aureus: NEGATIVE

## 2021-10-21 NOTE — Patient Instructions (Addendum)
Your procedure is scheduled on: Monday, October 23 Report to the Registration Desk on the 1st floor of the [facility]. To find out your arrival time, please call [phone] between 1PM - 3PM on: Friday, October 20 If your arrival time is 6:00 am, do not arrive prior to that time as the [facility] entrance doors do not open until 6:00 am.  REMEMBER: Instructions that are not followed completely may result in serious medical risk, up to and including death; or upon the discretion of your surgeon and anesthesiologist your surgery may need to be rescheduled.  Do not eat food after midnight the night before surgery.  No gum chewing, lozengers or hard candies.  You may however, drink CLEAR liquids up to 2 hours before you are scheduled to arrive for your surgery. Do not drink anything within 2 hours of your scheduled arrival time.  Clear liquids include: - water  - apple juice without pulp - gatorade (not RED colors) - black coffee or tea (Do NOT add milk or creamers to the coffee or tea) Do NOT drink anything that is not on this list.  TAKE THESE MEDICATIONS THE MORNING OF SURGERY WITH A SIP OF WATER:  Amlodipine Anoro ellipta inhaler Duloxetine (cymbalta) Pantoprazole (Protonix) - (take one the night before and one on the morning of surgery - helps to prevent nausea after surgery.) Oxycodone if needed for pain  Use inhalers on the day of surgery and bring your albuterol inhaler to the hospital.  One week prior to surgery: starting October 16 Stop Anti-inflammatories (NSAIDS) such as Advil, Aleve, Ibuprofen, Motrin, Naproxen, Naprosyn and Aspirin based products such as Excedrin, Goodys Powder, BC Powder. Stop ANY OVER THE COUNTER supplements until after surgery. You may however, continue to take Tylenol if needed for pain up until the day of surgery.  No Alcohol for 24 hours before or after surgery.  No Smoking including e-cigarettes for 24 hours prior to surgery.  No  chewable tobacco products for at least 6 hours prior to surgery.  No nicotine patches on the day of surgery.  Do not use any "recreational" drugs for at least a week prior to your surgery.  Please be advised that the combination of cocaine and anesthesia may have negative outcomes, up to and including death. If you test positive for cocaine, your surgery will be cancelled.  On the morning of surgery brush your teeth with toothpaste and water, you may rinse your mouth with mouthwash if you wish. Do not swallow any toothpaste or mouthwash.  Use CHG Soap as directed on instruction sheet.  Do not wear jewelry, make-up, hairpins, clips or nail polish.  Do not wear lotions, powders, or perfumes.   Do not shave body from the neck down 48 hours prior to surgery just in case you cut yourself which could leave a site for infection.  Also, freshly shaved skin may become irritated if using the CHG soap.  Contact lenses, hearing aids and dentures may not be worn into surgery.  Do not bring valuables to the hospital. [facility] is not responsible for any missing/lost belongings or valuables.   Notify your doctor if there is any change in your medical condition (cold, fever, infection).  Wear comfortable clothing (specific to your surgery type) to the hospital.  After surgery, you can help prevent lung complications by doing breathing exercises.  Take deep breaths and cough every 1-2 hours. Your doctor may order a device called an Incentive Spirometer to help you  take deep breaths.  If you are being admitted to the hospital overnight, leave your suitcase in the car. After surgery it may be brought to your room.  If you are being discharged the day of surgery, you will not be allowed to drive home. You will need a responsible adult (18 years or older) to drive you home and stay with you that night.   If you are taking public transportation, you will need to have a responsible adult (18 years or  older) with you. Please confirm with your physician that it is acceptable to use public transportation.   Please call the [facility] Dept. at [phone] if you have any questions about these instructions.  Surgery Visitation Policy:  Patients undergoing a surgery or procedure may have two family members or support persons with them as long as the person is not COVID-19 positive or experiencing its symptoms.   Inpatient Visitation:    Visiting hours are 7 a.m. to 8 p.m. Up to four visitors are allowed at one time in a patient room, including children. The visitors may rotate out with other people during the day. One designated support person (adult) may remain overnight.      Preparing for Surgery with CHLORHEXIDINE GLUCONATE (CHG) Soap  Chlorhexidine Gluconate (CHG) Soap  o An antiseptic cleaner that kills germs and bonds with the skin to continue killing germs even after washing  o Used for showering the night before surgery and morning of surgery  Before surgery, you can play an important role by reducing the number of germs on your skin.  CHG (Chlorhexidine gluconate) soap is an antiseptic cleanser which kills germs and bonds with the skin to continue killing germs even after washing.  Please do not use if you have an allergy to CHG or antibacterial soaps. If your skin becomes reddened/irritated stop using the CHG.  1. Shower the NIGHT BEFORE SURGERY and the MORNING OF SURGERY with CHG soap.  2. If you choose to wash your hair, wash your hair first as usual with your normal shampoo.  3. After shampooing, rinse your hair and body thoroughly to remove the shampoo.  4. Use CHG as you would any other liquid soap. You can apply CHG directly to the skin and wash gently with a scrungie or a clean washcloth.  5. Apply the CHG soap to your body only from the neck down. Do not use on open wounds or open sores. Avoid contact with your eyes, ears, mouth, and genitals  (private parts). Wash face and genitals (private parts) with your normal soap.  6. Wash thoroughly, paying special attention to the area where your surgery will be performed.  7. Thoroughly rinse your body with warm water.  8. Do not shower/wash with your normal soap after using and rinsing off the CHG soap.  9. Pat yourself dry with a clean towel.  10. Wear clean pajamas to bed the night before surgery.  12. Place clean sheets on your bed the night of your first shower and do not sleep with pets.  13. Shower again with the CHG soap on the day of surgery prior to arriving at the hospital.  14. Do not apply any deodorants/lotions/powders.  15. Please wear clean clothes to the hospital.

## 2021-11-02 ENCOUNTER — Other Ambulatory Visit: Payer: Self-pay

## 2021-11-02 ENCOUNTER — Observation Stay
Admission: RE | Admit: 2021-11-02 | Discharge: 2021-11-05 | Disposition: A | Discharge status: Home Health | Payer: Medicare Other | Attending: Neurosurgery | Admitting: Neurosurgery

## 2021-11-02 ENCOUNTER — Ambulatory Visit: Payer: Medicare Other | Admitting: Urgent Care

## 2021-11-02 ENCOUNTER — Encounter
Admission: RE | Disposition: A | Discharge status: Home Health | Payer: Self-pay | Source: Home / Self Care | Attending: Neurosurgery

## 2021-11-02 ENCOUNTER — Ambulatory Visit: Payer: Medicare Other

## 2021-11-02 ENCOUNTER — Encounter: Payer: Self-pay | Admitting: Neurosurgery

## 2021-11-02 DIAGNOSIS — I129 Hypertensive chronic kidney disease with stage 1 through stage 4 chronic kidney disease, or unspecified chronic kidney disease: Secondary | ICD-10-CM | POA: Insufficient documentation

## 2021-11-02 DIAGNOSIS — J449 Chronic obstructive pulmonary disease, unspecified: Secondary | ICD-10-CM | POA: Insufficient documentation

## 2021-11-02 DIAGNOSIS — F1419 Cocaine abuse with unspecified cocaine-induced disorder: Secondary | ICD-10-CM

## 2021-11-02 DIAGNOSIS — G959 Disease of spinal cord, unspecified: Secondary | ICD-10-CM

## 2021-11-02 DIAGNOSIS — E875 Hyperkalemia: Secondary | ICD-10-CM

## 2021-11-02 DIAGNOSIS — M4802 Spinal stenosis, cervical region: Secondary | ICD-10-CM | POA: Diagnosis present

## 2021-11-02 DIAGNOSIS — F1411 Cocaine abuse, in remission: Secondary | ICD-10-CM | POA: Diagnosis not present

## 2021-11-02 DIAGNOSIS — Z01818 Encounter for other preprocedural examination: Secondary | ICD-10-CM

## 2021-11-02 DIAGNOSIS — Z01812 Encounter for preprocedural laboratory examination: Secondary | ICD-10-CM

## 2021-11-02 DIAGNOSIS — I10 Essential (primary) hypertension: Secondary | ICD-10-CM

## 2021-11-02 DIAGNOSIS — F17210 Nicotine dependence, cigarettes, uncomplicated: Secondary | ICD-10-CM | POA: Insufficient documentation

## 2021-11-02 DIAGNOSIS — N183 Chronic kidney disease, stage 3 unspecified: Secondary | ICD-10-CM | POA: Insufficient documentation

## 2021-11-02 DIAGNOSIS — N1831 Chronic kidney disease, stage 3a: Secondary | ICD-10-CM

## 2021-11-02 HISTORY — PX: ANTERIOR CERVICAL DECOMP/DISCECTOMY FUSION: SHX1161

## 2021-11-02 LAB — TYPE AND SCREEN
ABO/RH(D): B POS
Antibody Screen: NEGATIVE

## 2021-11-02 LAB — URINE DRUG SCREEN, QUALITATIVE (ARMC ONLY)
Amphetamines, Ur Screen: NOT DETECTED
Barbiturates, Ur Screen: NOT DETECTED
Benzodiazepine, Ur Scrn: NOT DETECTED
Cannabinoid 50 Ng, Ur ~~LOC~~: NOT DETECTED
Cocaine Metabolite,Ur ~~LOC~~: NOT DETECTED
MDMA (Ecstasy)Ur Screen: NOT DETECTED
Methadone Scn, Ur: NOT DETECTED
Opiate, Ur Screen: NOT DETECTED
Phencyclidine (PCP) Ur S: NOT DETECTED
Tricyclic, Ur Screen: NOT DETECTED

## 2021-11-02 SURGERY — ANTERIOR CERVICAL DECOMPRESSION/DISCECTOMY FUSION 1 LEVEL
Anesthesia: General | Site: Spine Cervical

## 2021-11-02 MED ORDER — GABAPENTIN 400 MG PO CAPS
400.0000 mg | ORAL_CAPSULE | Freq: Two times a day (BID) | ORAL | Status: DC
  Administered 2021-11-03 – 2021-11-05 (×3): 400 mg via ORAL
  Filled 2021-11-02 (×5): qty 1

## 2021-11-02 MED ORDER — PROPOFOL 10 MG/ML IV BOLUS
INTRAVENOUS | Status: AC
  Filled 2021-11-02: qty 20

## 2021-11-02 MED ORDER — CHLORHEXIDINE GLUCONATE 0.12 % MT SOLN: 15.0000 mL | Freq: Once | OROMUCOSAL | Status: AC

## 2021-11-02 MED ORDER — OXYCODONE HCL 5 MG PO TABS
10.0000 mg | ORAL_TABLET | ORAL | Status: DC | PRN
  Administered 2021-11-02 – 2021-11-03 (×7): 10 mg via ORAL
  Filled 2021-11-02 (×7): qty 2

## 2021-11-02 MED ORDER — ACETAMINOPHEN 325 MG PO TABS
650.0000 mg | ORAL_TABLET | ORAL | Status: DC | PRN
  Administered 2021-11-04: 650 mg via ORAL
  Filled 2021-11-02: qty 2

## 2021-11-02 MED ORDER — LIDOCAINE HCL (CARDIAC) PF 100 MG/5ML IV SOSY
PREFILLED_SYRINGE | INTRAVENOUS | Status: DC | PRN
  Administered 2021-11-02: 60 mg via INTRAVENOUS

## 2021-11-02 MED ORDER — PROPOFOL 1000 MG/100ML IV EMUL
INTRAVENOUS | Status: AC
  Filled 2021-11-02: qty 100

## 2021-11-02 MED ORDER — CHLORHEXIDINE GLUCONATE 0.12 % MT SOLN
OROMUCOSAL | Status: AC
  Administered 2021-11-02: 15 mL via OROMUCOSAL
  Filled 2021-11-02: qty 15

## 2021-11-02 MED ORDER — CEFAZOLIN SODIUM-DEXTROSE 2-4 GM/100ML-% IV SOLN
INTRAVENOUS | Status: AC
  Filled 2021-11-02: qty 100

## 2021-11-02 MED ORDER — FLUTICASONE FUROATE-VILANTEROL 100-25 MCG/ACT IN AEPB: 1.0000 | INHALATION_SPRAY | Freq: Every day | RESPIRATORY_TRACT | Status: DC | PRN

## 2021-11-02 MED ORDER — 0.9 % SODIUM CHLORIDE (POUR BTL) OPTIME
TOPICAL | Status: DC | PRN
  Administered 2021-11-02: 500 mL

## 2021-11-02 MED ORDER — ACETAMINOPHEN 10 MG/ML IV SOLN
INTRAVENOUS | Status: AC
  Filled 2021-11-02: qty 100

## 2021-11-02 MED ORDER — AMLODIPINE BESYLATE 10 MG PO TABS
10.0000 mg | ORAL_TABLET | Freq: Every morning | ORAL | Status: DC
  Administered 2021-11-03 – 2021-11-05 (×3): 10 mg via ORAL
  Filled 2021-11-02 (×3): qty 1

## 2021-11-02 MED ORDER — LINACLOTIDE 145 MCG PO CAPS: 145.0000 ug | ORAL_CAPSULE | Freq: Every day | ORAL | Status: DC | PRN

## 2021-11-02 MED ORDER — EPHEDRINE 5 MG/ML INJ
INTRAVENOUS | Status: AC
  Filled 2021-11-02: qty 5

## 2021-11-02 MED ORDER — ALBUTEROL SULFATE (2.5 MG/3ML) 0.083% IN NEBU
3.0000 mL | INHALATION_SOLUTION | Freq: Four times a day (QID) | RESPIRATORY_TRACT | Status: DC | PRN
  Administered 2021-11-03: 3 mL via RESPIRATORY_TRACT
  Filled 2021-11-02: qty 3

## 2021-11-02 MED ORDER — EPHEDRINE SULFATE (PRESSORS) 50 MG/ML IJ SOLN
INTRAMUSCULAR | Status: DC | PRN
  Administered 2021-11-02: 5 mg via INTRAVENOUS

## 2021-11-02 MED ORDER — UMECLIDINIUM-VILANTEROL 62.5-25 MCG/ACT IN AEPB
1.0000 | INHALATION_SPRAY | Freq: Every day | RESPIRATORY_TRACT | Status: DC
  Administered 2021-11-04 – 2021-11-05 (×2): 1 via RESPIRATORY_TRACT
  Filled 2021-11-02: qty 14

## 2021-11-02 MED ORDER — OXYCODONE HCL 5 MG PO TABS: 5.0000 mg | ORAL_TABLET | Freq: Once | ORAL | Status: AC | PRN

## 2021-11-02 MED ORDER — PANTOPRAZOLE SODIUM 40 MG PO TBEC
40.0000 mg | DELAYED_RELEASE_TABLET | Freq: Every day | ORAL | Status: DC
  Administered 2021-11-03 – 2021-11-05 (×3): 40 mg via ORAL
  Filled 2021-11-02 (×3): qty 1

## 2021-11-02 MED ORDER — LABETALOL HCL 5 MG/ML IV SOLN
INTRAVENOUS | Status: AC
  Administered 2021-11-02: 5 mg via INTRAVENOUS
  Filled 2021-11-02: qty 4

## 2021-11-02 MED ORDER — GLYCOPYRROLATE 0.2 MG/ML IJ SOLN
INTRAMUSCULAR | Status: DC | PRN
  Administered 2021-11-02: .2 mg via INTRAVENOUS

## 2021-11-02 MED ORDER — BUPIVACAINE-EPINEPHRINE (PF) 0.5% -1:200000 IJ SOLN
INTRAMUSCULAR | Status: AC
  Filled 2021-11-02: qty 30

## 2021-11-02 MED ORDER — OXYCODONE-ACETAMINOPHEN 10-325 MG PO TABS: 1.0000 | ORAL_TABLET | ORAL | Status: DC | PRN

## 2021-11-02 MED ORDER — PHENYLEPHRINE HCL-NACL 20-0.9 MG/250ML-% IV SOLN
INTRAVENOUS | Status: DC | PRN
  Administered 2021-11-02: 40 ug/min via INTRAVENOUS

## 2021-11-02 MED ORDER — REMIFENTANIL HCL 1 MG IV SOLR
INTRAVENOUS | Status: DC | PRN
  Administered 2021-11-02: .2 ug/kg/min via INTRAVENOUS

## 2021-11-02 MED ORDER — SODIUM CHLORIDE 0.9 % IV SOLN: INTRAVENOUS | Status: DC

## 2021-11-02 MED ORDER — METHOCARBAMOL 1000 MG/10ML IJ SOLN: 500.0000 mg | Freq: Four times a day (QID) | INTRAVENOUS | Status: DC | PRN

## 2021-11-02 MED ORDER — LACTATED RINGERS IV SOLN: INTRAVENOUS | Status: DC

## 2021-11-02 MED ORDER — PHENYLEPHRINE 80 MCG/ML (10ML) SYRINGE FOR IV PUSH (FOR BLOOD PRESSURE SUPPORT)
PREFILLED_SYRINGE | INTRAVENOUS | Status: AC
  Filled 2021-11-02: qty 10

## 2021-11-02 MED ORDER — PHENOL 1.4 % MT LIQD: 1.0000 | OROMUCOSAL | Status: DC | PRN

## 2021-11-02 MED ORDER — REMIFENTANIL HCL 1 MG IV SOLR
INTRAVENOUS | Status: AC
  Filled 2021-11-02: qty 1000

## 2021-11-02 MED ORDER — FENTANYL CITRATE (PF) 100 MCG/2ML IJ SOLN
INTRAMUSCULAR | Status: AC
  Filled 2021-11-02: qty 2

## 2021-11-02 MED ORDER — OXYCODONE HCL 5 MG PO TABS
5.0000 mg | ORAL_TABLET | ORAL | Status: DC | PRN
  Administered 2021-11-02 – 2021-11-05 (×5): 5 mg via ORAL
  Filled 2021-11-02 (×4): qty 1

## 2021-11-02 MED ORDER — LABETALOL HCL 5 MG/ML IV SOLN: 5.0000 mg | INTRAVENOUS | Status: AC

## 2021-11-02 MED ORDER — SODIUM CHLORIDE 0.9% FLUSH
3.0000 mL | Freq: Two times a day (BID) | INTRAVENOUS | Status: DC
  Administered 2021-11-02 – 2021-11-05 (×5): 3 mL via INTRAVENOUS

## 2021-11-02 MED ORDER — LIDOCAINE HCL (PF) 2 % IJ SOLN
INTRAMUSCULAR | Status: AC
  Filled 2021-11-02: qty 5

## 2021-11-02 MED ORDER — CEFAZOLIN SODIUM-DEXTROSE 2-4 GM/100ML-% IV SOLN
2.0000 g | INTRAVENOUS | Status: AC
  Administered 2021-11-02: 2 g via INTRAVENOUS

## 2021-11-02 MED ORDER — CEFAZOLIN IN SODIUM CHLORIDE 2-0.9 GM/100ML-% IV SOLN
2.0000 g | Freq: Once | INTRAVENOUS | Status: DC
  Filled 2021-11-02: qty 100

## 2021-11-02 MED ORDER — GABAPENTIN 800 MG PO TABS: 800.0000 mg | ORAL_TABLET | Freq: Every day | ORAL | Status: DC

## 2021-11-02 MED ORDER — MIDAZOLAM HCL 2 MG/2ML IJ SOLN
INTRAMUSCULAR | Status: AC
  Filled 2021-11-02: qty 2

## 2021-11-02 MED ORDER — ORAL CARE MOUTH RINSE: 15.0000 mL | Freq: Once | OROMUCOSAL | Status: AC

## 2021-11-02 MED ORDER — AMLODIPINE BESYLATE 5 MG PO TABS
ORAL_TABLET | ORAL | Status: AC
  Administered 2021-11-02: 10 mg via ORAL
  Filled 2021-11-02: qty 2

## 2021-11-02 MED ORDER — GLYCOPYRROLATE 0.2 MG/ML IJ SOLN
INTRAMUSCULAR | Status: AC
  Filled 2021-11-02: qty 1

## 2021-11-02 MED ORDER — PHENYLEPHRINE HCL (PRESSORS) 10 MG/ML IV SOLN
INTRAVENOUS | Status: DC | PRN
  Administered 2021-11-02 (×2): 160 ug via INTRAVENOUS

## 2021-11-02 MED ORDER — AMITRIPTYLINE HCL 50 MG PO TABS
50.0000 mg | ORAL_TABLET | Freq: Every day | ORAL | Status: DC
  Administered 2021-11-03 – 2021-11-04 (×2): 50 mg via ORAL
  Filled 2021-11-02 (×3): qty 1

## 2021-11-02 MED ORDER — MENTHOL 3 MG MT LOZG: 1.0000 | LOZENGE | OROMUCOSAL | Status: DC | PRN

## 2021-11-02 MED ORDER — OXYCODONE HCL 5 MG PO TABS
ORAL_TABLET | ORAL | Status: AC
  Filled 2021-11-02: qty 1

## 2021-11-02 MED ORDER — OXYCODONE HCL 5 MG PO TABS
ORAL_TABLET | ORAL | Status: AC
  Administered 2021-11-02: 5 mg via ORAL
  Filled 2021-11-02: qty 1

## 2021-11-02 MED ORDER — HYDROMORPHONE HCL 1 MG/ML IJ SOLN: INTRAMUSCULAR | Status: DC | PRN

## 2021-11-02 MED ORDER — DULOXETINE HCL 30 MG PO CPEP
30.0000 mg | ORAL_CAPSULE | Freq: Every day | ORAL | Status: DC
  Administered 2021-11-03 – 2021-11-05 (×3): 30 mg via ORAL
  Filled 2021-11-02 (×4): qty 1

## 2021-11-02 MED ORDER — SUCCINYLCHOLINE CHLORIDE 200 MG/10ML IV SOSY
PREFILLED_SYRINGE | INTRAVENOUS | Status: AC
  Filled 2021-11-02: qty 10

## 2021-11-02 MED ORDER — ESMOLOL HCL-SODIUM CHLORIDE 2000 MG/100ML IV SOLN
INTRAVENOUS | Status: DC | PRN
  Administered 2021-11-02: 10 mg via INTRAVENOUS

## 2021-11-02 MED ORDER — HYDROMORPHONE HCL 1 MG/ML IJ SOLN
INTRAMUSCULAR | Status: DC | PRN
  Administered 2021-11-02 (×2): .5 mg via INTRAVENOUS

## 2021-11-02 MED ORDER — HYDROMORPHONE HCL 1 MG/ML IJ SOLN
INTRAMUSCULAR | Status: AC
  Filled 2021-11-02: qty 1

## 2021-11-02 MED ORDER — ONDANSETRON HCL 4 MG/2ML IJ SOLN
INTRAMUSCULAR | Status: AC
  Filled 2021-11-02: qty 2

## 2021-11-02 MED ORDER — METHOCARBAMOL 500 MG PO TABS
ORAL_TABLET | ORAL | Status: AC
  Filled 2021-11-02: qty 1

## 2021-11-02 MED ORDER — ACETAMINOPHEN 10 MG/ML IV SOLN
INTRAVENOUS | Status: DC | PRN
  Administered 2021-11-02: 1000 mg via INTRAVENOUS

## 2021-11-02 MED ORDER — SURGIFLO WITH THROMBIN (HEMOSTATIC MATRIX KIT) OPTIME
TOPICAL | Status: DC | PRN
  Administered 2021-11-02: 1 via TOPICAL

## 2021-11-02 MED ORDER — SUCCINYLCHOLINE CHLORIDE 200 MG/10ML IV SOSY
PREFILLED_SYRINGE | INTRAVENOUS | Status: DC | PRN
  Administered 2021-11-02: 80 mg via INTRAVENOUS

## 2021-11-02 MED ORDER — MIDAZOLAM HCL 2 MG/2ML IJ SOLN
INTRAMUSCULAR | Status: DC | PRN
  Administered 2021-11-02: 2 mg via INTRAVENOUS

## 2021-11-02 MED ORDER — FENTANYL CITRATE (PF) 100 MCG/2ML IJ SOLN
INTRAMUSCULAR | Status: DC | PRN
  Administered 2021-11-02 (×2): 50 ug via INTRAVENOUS

## 2021-11-02 MED ORDER — FENTANYL CITRATE (PF) 100 MCG/2ML IJ SOLN
INTRAMUSCULAR | Status: AC
  Administered 2021-11-02: 50 ug via INTRAVENOUS
  Filled 2021-11-02: qty 2

## 2021-11-02 MED ORDER — BUPIVACAINE-EPINEPHRINE (PF) 0.5% -1:200000 IJ SOLN
INTRAMUSCULAR | Status: DC | PRN
  Administered 2021-11-02: 5 mL

## 2021-11-02 MED ORDER — GABAPENTIN 400 MG PO CAPS
800.0000 mg | ORAL_CAPSULE | Freq: Every day | ORAL | Status: DC
  Administered 2021-11-02 – 2021-11-04 (×3): 800 mg via ORAL
  Filled 2021-11-02 (×3): qty 2

## 2021-11-02 MED ORDER — ACETAMINOPHEN 650 MG RE SUPP: 650.0000 mg | RECTAL | Status: DC | PRN

## 2021-11-02 MED ORDER — PROPOFOL 10 MG/ML IV BOLUS
INTRAVENOUS | Status: DC | PRN
  Administered 2021-11-02: 120 mg via INTRAVENOUS
  Administered 2021-11-02: 150 ug/kg/min via INTRAVENOUS

## 2021-11-02 MED ORDER — FENTANYL CITRATE (PF) 100 MCG/2ML IJ SOLN
25.0000 ug | INTRAMUSCULAR | Status: DC | PRN
  Administered 2021-11-02: 50 ug via INTRAVENOUS

## 2021-11-02 MED ORDER — DEXAMETHASONE SODIUM PHOSPHATE 10 MG/ML IJ SOLN
INTRAMUSCULAR | Status: AC
  Filled 2021-11-02: qty 1

## 2021-11-02 MED ORDER — ATORVASTATIN CALCIUM 20 MG PO TABS
40.0000 mg | ORAL_TABLET | Freq: Every day | ORAL | Status: DC
  Administered 2021-11-02 – 2021-11-04 (×3): 40 mg via ORAL
  Filled 2021-11-02 (×3): qty 2

## 2021-11-02 MED ORDER — METHOCARBAMOL 500 MG PO TABS
500.0000 mg | ORAL_TABLET | Freq: Four times a day (QID) | ORAL | Status: DC | PRN
  Administered 2021-11-02 – 2021-11-05 (×5): 500 mg via ORAL
  Filled 2021-11-02 (×4): qty 1

## 2021-11-02 MED ORDER — FLUTICASONE PROPIONATE 50 MCG/ACT NA SUSP
1.0000 | Freq: Every day | NASAL | Status: DC | PRN
  Administered 2021-11-05: 1 via NASAL
  Filled 2021-11-02: qty 16

## 2021-11-02 MED ORDER — OXYCODONE HCL 5 MG/5ML PO SOLN: 5.0000 mg | Freq: Once | ORAL | Status: AC | PRN

## 2021-11-02 MED ORDER — SODIUM CHLORIDE 0.9% FLUSH: 3.0000 mL | INTRAVENOUS | Status: DC | PRN

## 2021-11-02 MED ORDER — SODIUM CHLORIDE 0.9 % IV SOLN: 250.0000 mL | INTRAVENOUS | Status: DC

## 2021-11-02 MED ORDER — DEXAMETHASONE SODIUM PHOSPHATE 10 MG/ML IJ SOLN
INTRAMUSCULAR | Status: DC | PRN
  Administered 2021-11-02: 10 mg via INTRAVENOUS

## 2021-11-02 MED ORDER — CELECOXIB 200 MG PO CAPS
200.0000 mg | ORAL_CAPSULE | Freq: Two times a day (BID) | ORAL | Status: DC
  Administered 2021-11-02 – 2021-11-05 (×6): 200 mg via ORAL
  Filled 2021-11-02 (×6): qty 1

## 2021-11-02 SURGICAL SUPPLY — 53 items
ADH SKN CLS APL DERMABOND .7 (GAUZE/BANDAGES/DRESSINGS) ×1
AGENT HMST KT MTR STRL THRMB (HEMOSTASIS) ×1
BASIN KIT SINGLE STR (MISCELLANEOUS) ×1 IMPLANT
BASKET BONE COLLECTION (BASKET) IMPLANT
BULB RESERV EVAC DRAIN JP 100C (MISCELLANEOUS) IMPLANT
BUR NEURO DRILL SOFT 3.0X3.8M (BURR) ×1 IMPLANT
DERMABOND ADVANCED .7 DNX12 (GAUZE/BANDAGES/DRESSINGS) ×1 IMPLANT
DRAIN CHANNEL JP 10F RND 20C F (MISCELLANEOUS) IMPLANT
DRAPE C ARM PK CFD 31 SPINE (DRAPES) ×1 IMPLANT
DRAPE LAPAROTOMY 77X122 PED (DRAPES) ×1 IMPLANT
DRAPE MICROSCOPE SPINE 48X150 (DRAPES) ×1 IMPLANT
DRAPE SURG 17X11 SM STRL (DRAPES) ×1 IMPLANT
ELECT REM PT RETURN 9FT ADLT (ELECTROSURGICAL) ×1
ELECTRODE REM PT RTRN 9FT ADLT (ELECTROSURGICAL) ×1 IMPLANT
FEE INTRAOP CADWELL SUPPLY NCS (MISCELLANEOUS) IMPLANT
FEE INTRAOP MONITOR IMPULS NCS (MISCELLANEOUS) IMPLANT
GLOVE BIOGEL PI IND STRL 6.5 (GLOVE) ×1 IMPLANT
GLOVE BIOGEL PI IND STRL 8.5 (GLOVE) ×1 IMPLANT
GLOVE SURG SYN 6.5 ES PF (GLOVE) ×1 IMPLANT
GLOVE SURG SYN 6.5 PF PI (GLOVE) ×1 IMPLANT
GLOVE SURG SYN 8.5  E (GLOVE) ×3
GLOVE SURG SYN 8.5 E (GLOVE) ×3 IMPLANT
GLOVE SURG SYN 8.5 PF PI (GLOVE) ×3 IMPLANT
GOWN SRG LRG LVL 4 IMPRV REINF (GOWNS) ×1 IMPLANT
GOWN SRG XL LVL 3 NONREINFORCE (GOWNS) ×1 IMPLANT
GOWN STRL NON-REIN TWL XL LVL3 (GOWNS) ×1
GOWN STRL REIN LRG LVL4 (GOWNS) ×1
INTRAOP CADWELL SUPPLY FEE NCS (MISCELLANEOUS) ×1
INTRAOP DISP SUPPLY FEE NCS (MISCELLANEOUS) ×1
INTRAOP MONITOR FEE IMPULS NCS (MISCELLANEOUS) ×1
INTRAOP MONITOR FEE IMPULSE (MISCELLANEOUS) ×1
KIT TURNOVER KIT A (KITS) ×1 IMPLANT
MANIFOLD NEPTUNE II (INSTRUMENTS) ×1 IMPLANT
NDL SAFETY ECLIP 18X1.5 (MISCELLANEOUS) ×1 IMPLANT
NS IRRIG 1000ML POUR BTL (IV SOLUTION) ×1 IMPLANT
NS IRRIG 500ML POUR BTL (IV SOLUTION) IMPLANT
PACK LAMINECTOMY NEURO (CUSTOM PROCEDURE TRAY) ×1 IMPLANT
PAD ARMBOARD 7.5X6 YLW CONV (MISCELLANEOUS) ×2 IMPLANT
PIN CASPAR 14 (PIN) ×1 IMPLANT
PIN CASPAR 14MM (PIN) ×1
PLATE ANT CERV XTEND 1 LV 12 (Plate) IMPLANT
SCREW VAR 4.2 XD SELF DRILL 16 (Screw) IMPLANT
SPACER CERVICAL FRGE 12X14X7-7 (Spacer) IMPLANT
SPONGE KITTNER 5P (MISCELLANEOUS) ×1 IMPLANT
STAPLER SKIN PROX 35W (STAPLE) IMPLANT
SURGIFLO W/THROMBIN 8M KIT (HEMOSTASIS) ×1 IMPLANT
SUT V-LOC 90 ABS DVC 3-0 CL (SUTURE) ×1 IMPLANT
SUT VIC AB 3-0 SH 8-18 (SUTURE) ×1 IMPLANT
SYR 20ML LL LF (SYRINGE) ×1 IMPLANT
TAPE CLOTH 3X10 WHT NS LF (GAUZE/BANDAGES/DRESSINGS) ×3 IMPLANT
TRAP FLUID SMOKE EVACUATOR (MISCELLANEOUS) ×1 IMPLANT
TRAY FOLEY MTR SLVR 16FR STAT (SET/KITS/TRAYS/PACK) IMPLANT
WATER STERILE IRR 1000ML POUR (IV SOLUTION) ×2 IMPLANT

## 2021-11-02 NOTE — Anesthesia Procedure Notes (Signed)
Procedure Name: Intubation Date/Time: 11/02/2021 1:42 PM  Performed by: Cammie Sickle, CRNAPre-anesthesia Checklist: Patient identified, Patient being monitored, Timeout performed, Emergency Drugs available and Suction available Patient Re-evaluated:Patient Re-evaluated prior to induction Oxygen Delivery Method: Circle system utilized Preoxygenation: Pre-oxygenation with 100% oxygen Induction Type: IV induction Ventilation: Mask ventilation without difficulty Laryngoscope Size: Mac and 3 Grade View: Grade I Tube type: Oral Tube size: 7.0 mm Number of attempts: 1 Airway Equipment and Method: Stylet Placement Confirmation: ETT inserted through vocal cords under direct vision, positive ETCO2 and breath sounds checked- equal and bilateral Secured at: 21 cm Tube secured with: Tape Dental Injury: Teeth and Oropharynx as per pre-operative assessment

## 2021-11-02 NOTE — Progress Notes (Signed)
Pt dozing to sleep on and off.  BP remains elevated.  HR up and down but remains between sinus rhythm and sinus arrhythmia.  Intermittently pt will invert P waves as well then returns to normal.  Dr Bertell Maria called and notified.  Orders received.

## 2021-11-02 NOTE — Transfer of Care (Signed)
Immediate Anesthesia Transfer of Care Note  Patient: [name]  Procedure(s) Performed: C3-4 ANTERIOR CERVICAL DISCECTOMY AND FUSION (GLOBUS FORGE) (Spine Cervical)  Patient Location: PACU  Anesthesia Type:General  Level of Consciousness: awake  Airway & Oxygen Therapy: Patient Spontanous Breathing and Patient connected to face mask oxygen  Post-op Assessment: Report given to RN and Post -op Vital signs reviewed and stable  Post vital signs: Reviewed and stable  Last Vitals:  Vitals Value Taken Time  BP 184/89 11/02/21 1530  Temp 35.8 1528  Pulse 92 11/02/21 1531  Resp 20 11/02/21 1531  SpO2 100 % 11/02/21 1531  Vitals shown include unvalidated device data.  Last Pain:  Vitals:   11/02/21 1147  TempSrc: Temporal  PainSc: 0-No pain         Complications: No notable events documented.

## 2021-11-02 NOTE — Progress Notes (Signed)
Patient refused to take Elavil, Cymbalta and Gabapentin, patient state that she had so much already and she took Cymbalta in the morning at home. Patient was educated the importance of the scheduled medicine.

## 2021-11-02 NOTE — Op Note (Addendum)
Indications: Ms. Gullickson is a 66 yo female who presented with: G95.9 cervical myelopathy, M48.02 cervical stenosis  Due to worsening weakness, surgery was recommended.  Findings: cervical stenosis  Preoperative Diagnosis: G95.9 cervical myelopathy, M48.02 cervical stenosis  Postoperative Diagnosis: same   EBL: 20 ml IVF: see AR ml Drains: none Disposition: Extubated and Stable to PACU Complications: none  No foley catheter was placed.   Preoperative Note:   Risks of surgery discussed include: infection, bleeding, stroke, coma, death, paralysis, CSF leak, nerve/spinal cord injury, numbness, tingling, weakness, complex regional pain syndrome, recurrent stenosis and/or disc herniation, vascular injury, development of instability, neck/back pain, need for further surgery, persistent symptoms, development of deformity, and the risks of anesthesia. The patient understood these risks and agreed to proceed.  Procedure:  1) Anterior cervical diskectomy and fusion at C3-4 2) Anterior cervical instrumentation at C3-4 3) Structural allograft consisting of corticocancellous allograft   Procedure: After obtaining informed consent, the patient taken to the operating room, placed in supine position, general anesthesia induced.  The patient had a small shoulder roll placed behind their shoulders.  The patient received preop antibiotics and IV Decadron.  The patient had a neck incision outlined, was prepped and draped in usual sterile fashion. The incision was injected with local anesthetic.   An incision was opened, dissection taken down medial to the carotid artery and jugular vein, lateral to the trachea and esophagus.  The prevertebral fascia identified and a localizing x-ray demonstrated the correct level.  The longus colli were dissected laterally, and self-retaining retractors placed to open the operative field. The microscope was then brought into the field.  With this complete, distractor pins  were placed in the vertebral bodies of C3 and C4. The distractor was placed, and the annulus at C3/4 was opened using a bovie.  Curettes and pituitary rongeurs used to remove the majority of disk, then the drill was used to remove the posterior osteophyte and begin the foraminotomies. The nerve hook was used to elevate the posterior longitudinal ligament, which was then removed with Kerrison rongeurs. The microblunt nerve hook could be passed out the foramen bilaterally.   Meticulous hemostasis obtained.  Structural allograft was tapped behind the anterior lip of the vertebral body at C3/4 (7 mm).    The caspar distractor was removed, and bone wax used for hemostasis. A separate, 12 mm Globus Xtend plate was chosen.  Two screws placed in each vertebral body, respectively making sure the screws were behind the locking mechanism.  Final AP and lateral radiographs were taken.   Please note that the plate is not inclusive to the interbody structural allograft.  The anchoring mechanism of the plate is completely separate from the allograft.  With everything in good position, the wound was irrigated copiously and meticulous hemostasis obtained.  Wound was closed in 2 layers using interrupted inverted 3-0 Vicryl sutures.  The wound was dressed with dermabond, the head of bed at 30 degrees, taken to recovery room in stable condition.  No new postop neurological deficits were identified.  Sponge and pattie counts were correct at the end of the procedure.   Monitoring was stable throughout.  I performed the entire procedure with Geronimo Boot PA as an Pensions consultant. An assistant was required for this procedure due to the complexity.  The assistant provided assistance in tissue manipulation and suction, and was required for the successful and safe performance of the procedure. I performed the critical portions of the procedure.   Meade Maw  MD

## 2021-11-02 NOTE — Interval H&P Note (Signed)
History and Physical Interval Note:  11/02/2021 1:01 PM  [name]  has presented today for surgery, with the diagnosis of G95.9 cervical myelopathy M48.02 cervical stenosis.  The various methods of treatment have been discussed with the patient and family. After consideration of risks, benefits and other options for treatment, the patient has consented to  Procedure(s): C3-4 ANTERIOR CERVICAL DISCECTOMY AND FUSION (GLOBUS FORGE) (N/A) as a surgical intervention.  The patient's history has been reviewed, patient examined, no change in status, stable for surgery.  I have reviewed the patient's chart and labs.  Questions were answered to the patient's satisfaction.    Heart sounds normal no MRG. Chest Clear to Auscultation Bilaterally.   Murel Shenberger

## 2021-11-02 NOTE — Anesthesia Preprocedure Evaluation (Addendum)
Anesthesia Evaluation  Patient identified by MRN, date of birth, ID band Patient awake    Reviewed: Allergy & Precautions, NPO status , Patient's Chart, lab work & pertinent test results  Airway Mallampati: III  TM Distance: >3 FB Neck ROM: Full    Dental  (+) Dental Advisory Given, Poor Dentition, Missing   Pulmonary neg pulmonary ROS, shortness of breath and with exertion, COPD,  COPD inhaler, Current Smoker and Patient abstained from smoking.,    Pulmonary exam normal breath sounds clear to auscultation       Cardiovascular hypertension, Pt. on medications + CAD  negative cardio ROS Normal cardiovascular exam Rhythm:Regular Rate:Normal     Neuro/Psych  Neuromuscular disease negative neurological ROS  negative psych ROS   GI/Hepatic negative GI ROS, Neg liver ROS, GERD  Medicated,(+)     substance abuse  cocaine use, acute appendicitis    Endo/Other  negative endocrine ROS  Renal/GU Renal InsufficiencyRenal disease Bladder dysfunction      Musculoskeletal  (+) Arthritis , Rheumatoid disorders,  narcotic dependent  Abdominal   Peds  Hematology negative hematology ROS (+)   Anesthesia Other Findings Day of surgery medications reviewed with the patient.  Reproductive/Obstetrics negative OB ROS                            Anesthesia Physical  Anesthesia Plan  ASA: 3  Anesthesia Plan: General ETT   Post-op Pain Management:    Induction: Intravenous  PONV Risk Score and Plan: 4 or greater and Midazolam, Dexamethasone and Ondansetron  Airway Management Planned: Oral ETT  Additional Equipment:   Intra-op Plan:   Post-operative Plan: Extubation in OR  Informed Consent: I have reviewed the patients History and Physical, chart, labs and discussed the procedure including the risks, benefits and alternatives for the proposed anesthesia with the patient or authorized representative  who has indicated his/her understanding and acceptance.     Dental Advisory Given  Plan Discussed with: Anesthesiologist, CRNA and Surgeon  Anesthesia Plan Comments: (Patient consented for risks of anesthesia including but not limited to:  - adverse reactions to medications - damage to eyes, teeth, lips or other oral mucosa - nerve damage due to positioning  - sore throat or hoarseness - Damage to heart, brain, nerves, lungs, other parts of body or loss of life  Patient voiced understanding.)        Anesthesia Quick Evaluation

## 2021-11-03 ENCOUNTER — Encounter: Payer: Self-pay | Admitting: Neurosurgery

## 2021-11-03 DIAGNOSIS — M4802 Spinal stenosis, cervical region: Secondary | ICD-10-CM | POA: Diagnosis not present

## 2021-11-03 NOTE — Progress Notes (Signed)
    Attending Progress Note  History: [name] is here for cervical myelopathy  POD1: Doing well.  Has some SOB (baseline)  Physical Exam: Vitals:   11/03/21 0447 11/03/21 0545  BP: (!) 175/86 (!) 149/77  Pulse: 71 66  Resp: 18 18  Temp: 97.9 F (36.6 C) 97.9 F (36.6 C)  SpO2: 100% 100%    AA Ox3 CNI  Strength:5/5 throughout BUE except grip (4+)  Data:  Other tests/results: n/a  Assessment/Plan:  [name] is doing well after ACDF for cervical myelopathy  - mobilize - pain control - DVT prophylaxis - PTOT - breathing treatment this AM   Meade Maw MD, [facility] Department of Neurosurgery

## 2021-11-03 NOTE — TOC Progression Note (Signed)
Transition of Care [facility]) - Progression Note    Patient Details  Name: [name] MRN: [id number] Date of Birth: 1955-09-12  Transition of Care [facility]) CM/SW [place], RN Phone Number: 11/03/2021, 8:44 AM  Clinical Narrative:     The patient is open with [facility] prior to surgery by the surgeon's office       Expected Discharge Plan and Services                                                 Social Determinants of Health (SDOH) Interventions    Readmission Risk Interventions    04/05/2021    5:04 PM 11/13/2020   10:26 AM 11/10/2020   11:54 AM  Readmission Risk Prevention Plan  Transportation Screening Complete Complete Complete  PCP or Specialist Appt within 5-7 Days   Complete  PCP or Specialist Appt within 3-5 Days  Complete   Home Care Screening   Complete  Medication Review (RN CM)   Complete  HRI or Home Care Consult  Complete   Social Work Consult for [place] Planning/Counseling  Complete   Palliative Care Screening  Complete   Medication Review Press photographer) Complete Complete   PCP or Specialist appointment within 3-5 days of discharge Complete    HRI or [place] Complete    SW Recovery Care/Counseling Consult Complete    Palliative Care Screening Complete    Skilled Nursing Facility Complete

## 2021-11-03 NOTE — Plan of Care (Signed)
  Problem: Education: Goal: Knowledge of General Education information will improve Description: Including pain rating scale, medication(s)/side effects and non-pharmacologic comfort measures Outcome: Progressing   Problem: Health Behavior/Discharge Planning: Goal: Ability to manage health-related needs will improve Outcome: Progressing   Problem: Nutrition: Goal: Adequate nutrition will be maintained Outcome: Progressing   Problem: Pain Managment: Goal: General experience of comfort will improve Outcome: Progressing   Problem: Health Behavior/Discharge Planning: Goal: Ability to manage health-related needs will improve Outcome: Progressing
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
## 2021-11-03 NOTE — TOC Progression Note (Addendum)
Transition of Care (TOC) - Progression Note    Patient Details  Name: [name] MRN: [id number] Date of Birth: 08/02/1955  Transition of Care (TOC) CM/SW Contact   J Louvet, RN Phone Number: 11/03/2021, 10:16 AM  Clinical Narrative:     Met with the patient in the room, she is agreeable to go to STR SNF< SHE Lives alone and her daughter is not able to stay with her She would like to go to the SNF on [address] that she has been to before Attempted to call her daughter Rolanda , left a general VM asking for a return call  Per the Notes the patient has been to [facility] in [place] For Rehab Bedsearch sent       Expected Discharge Plan and Services                                                 Social Determinants of Health (SDOH) Interventions    Readmission Risk Interventions    04/05/2021    5:04 PM 11/13/2020   10:26 AM 11/10/2020   11:54 AM  Readmission Risk Prevention Plan  Transportation Screening Complete Complete Complete  PCP or Specialist Appt within 5-7 Days   Complete  PCP or Specialist Appt within 3-5 Days  Complete   Home Care Screening   Complete  Medication Review (RN CM)   Complete  HRI or Home Care Consult  Complete   Social Work Consult for Recovery Care Planning/Counseling  Complete   Palliative Care Screening  Complete   Medication Review (RN Care Manager) Complete Complete   PCP or Specialist appointment within 3-5 days of discharge Complete    HRI or Home Care Consult Complete    SW Recovery Care/Counseling Consult Complete    Palliative Care Screening Complete    Skilled Nursing Facility Complete      

## 2021-11-03 NOTE — Anesthesia Postprocedure Evaluation (Signed)
Anesthesia Post Note  Patient: [name]  Procedure(s) Performed: C3-4 ANTERIOR CERVICAL DISCECTOMY AND FUSION (GLOBUS FORGE) (Spine Cervical)  Patient location during evaluation: PACU Anesthesia Type: General Level of consciousness: awake and alert Pain management: pain level controlled Vital Signs Assessment: post-procedure vital signs reviewed and stable Respiratory status: spontaneous breathing, nonlabored ventilation, respiratory function stable and patient connected to nasal cannula oxygen Cardiovascular status: blood pressure returned to baseline and stable Postop Assessment: no apparent nausea or vomiting Anesthetic complications: no   No notable events documented.   Last Vitals:  Vitals:   11/03/21 0447 11/03/21 0545  BP: (!) 175/86 (!) 149/77  Pulse: 71 66  Resp: 18 18  Temp: 36.6 C 36.6 C  SpO2: 100% 100%    Last Pain:  Vitals:   11/03/21 0547  TempSrc:   PainSc: Asleep                 Arita Miss

## 2021-11-03 NOTE — Progress Notes (Signed)
Mobility Specialist - Progress Note   11/03/21 1100  Mobility  Activity Ambulated with assistance to bathroom  Level of Assistance Standby assist, set-up cues, supervision of patient - no hands on  Assistive Device Front wheel walker  Distance Ambulated (ft) 10 ft  Activity Response Tolerated well  Mobility Referral Yes  $Mobility charge 1 Mobility   MS responding to bed alarm. Pt sitting in bathroom upon entry, utilizing RA. Pt STS to RW MinA. Pt ambulated from the bathroom to the bed using RW with supervision. Pt voiced concern for the need of a purewick, RN notified. Pt left supine with alarm set and needs within reach.  Candie [place] Mobility Specialist 11/03/21 11:06 AM

## 2021-11-03 NOTE — NC FL2 (Signed)
[place] LEVEL OF CARE SCREENING TOOL     IDENTIFICATION  Patient Name: [name] Birthdate: Feb 05, 1955 Sex: female Admission Date (Current Location): 11/02/2021  [place] and [place] Number:  Engineering geologist and Address:  [facility], [address] [place], [place] [address]      Provider Number: [id number]  Attending Physician Name and Address:  Meade Maw, MD  Relative Name and Phone Number:  Rolanda Daughter    Current Level of Care:   Recommended Level of Care: [place] Prior Approval Number:    Date Approved/Denied:   PASRR Number: [id number]  Discharge Plan: SNF    Current Diagnoses: Patient Active Problem List   Diagnosis Date Noted   Cervical myelopathy ([place]) 11/02/2021   Disease of spinal cord, unspecified ([place]) 11/02/2021   Spinal stenosis in cervical region 11/02/2021   Altered mental status 07/18/2021   B12 deficiency 04/07/2021   Folate deficiency 04/07/2021   Protein-calorie malnutrition, moderate ([place]) 04/06/2021   Contracture of joint, lower leg 04/03/2021   Dupuytren's contracture of left hand 04/03/2021   UTI (urinary tract infection) 04/03/2021   Severe pain 04/03/2021   Intractable pain 04/02/2021   Pleural effusion 11/12/2020   Dyspnea on exertion 11/11/2020   Perforated appendicitis 11/02/2020   Chronic kidney disease, stage 3a ([place]) 11/02/2020   Colitis 06/23/2018   Coronary artery calcification 02/23/2017   Closed nondisplaced fracture of lateral malleolus of right fibula 10/28/2016   Noninfectious gastroenteritis    Hematochezia 09/01/2016   Acute diarrhea 08/28/2016   Hydronephrosis, right 06/29/2015   Hyperkalemia 06/29/2015   HCAP (healthcare-associated pneumonia) 06/29/2015   Peripheral neuropathy 06/29/2015   Candida infection, oral 06/28/2015   Anemia due to other cause 06/10/2015   Hypoalbuminemia due to protein-calorie malnutrition ([place])  06/10/2015   Ankle fracture, right 06/03/2015   Severe sepsis ([place]) 05/29/2015   Cervical spinal stenosis 05/12/2015   S/P cervical spinal fusion 04/17/2015   Acute cystitis without hematuria    Vomiting    Constipation 02/17/2015   CAP (community acquired pneumonia) 02/15/2015   Encephalopathy, metabolic 02/72/5366   Cocaine abuse with cocaine-induced disorder ([place]) 02/14/2015   Sepsis ([place]) 10/28/2014   SIRS (systemic inflammatory response syndrome) ([place]) 10/28/2014   Sore throat 10/28/2014   Acute encephalopathy 44/03/4740   Metabolic acidosis    Leukocytosis    Orthostatic hypotension 09/10/2014   Dehydration 09/10/2014   Chest pain at rest 09/09/2014   Tobacco abuse 09/09/2014   Mixed hyperlipidemia 09/09/2014   COPD (chronic obstructive pulmonary disease) ([place]) 09/09/2014   GERD without esophagitis 09/09/2014   Chest pain 08/20/2014   Nausea & vomiting 08/20/2014   Acute renal failure superimposed on stage 3 chronic kidney disease ([place]) 08/20/2014   Lower urinary tract infectious disease 08/20/2014   Pain in the chest    Pain 02/11/2013   Cocaine abuse ([place]) 02/11/2013   Rheumatoid arthritis flare ([place]) 02/11/2013   Acute renal failure ([place]) 02/11/2013   AKI (acute kidney injury) ([place]) 02/11/2013   Hiatal hernia with gastroesophageal reflux 10/11/2012   Abdominal mass 10/08/2012   Knee pain 10/08/2012   Essential hypertension 10/08/2012   Pancreatic mass 10/07/2012   Abdominal pain 10/07/2012   Rheumatoid arthritis ([place])     Orientation RESPIRATION BLADDER Height & Weight     Self, Time, Situation, Place  O2 (2liters) Continent, External catheter Weight: 72.1 kg Height:  5' 6.5" (168.9 cm)  BEHAVIORAL SYMPTOMS/MOOD NEUROLOGICAL BOWEL NUTRITION STATUS  Continent Diet (see dc summary)  AMBULATORY STATUS COMMUNICATION OF NEEDS Skin   Extensive Assist Verbally Normal, Surgical wounds                       Personal Care Assistance Level of Assistance   Bathing, Feeding, Dressing Bathing Assistance: Maximum assistance Feeding assistance: Limited assistance Dressing Assistance: Maximum assistance     Functional Limitations Info  Sight, Hearing, Speech Sight Info: Adequate Hearing Info: Impaired Speech Info: Adequate    SPECIAL CARE FACTORS FREQUENCY  PT (By licensed PT), OT (By licensed OT)     PT Frequency: 5 times per week OT Frequency: 5 times per week            Contractures      Additional Factors Info                  Current Medications (11/03/2021):  This is the current hospital active medication list Current Facility-Administered Medications  Medication Dose Route Frequency Provider Last Rate Last Admin   0.9 %  sodium chloride infusion  250 mL Intravenous Continuous Luna, Stacy, PA-C       0.9 %  sodium chloride infusion   Intravenous Continuous Geronimo Boot, PA-C 50 mL/hr at 11/03/21 0306 Infusion Verify at 11/03/21 0306   acetaminophen (TYLENOL) tablet 650 mg  650 mg Oral Q4H PRN Geronimo Boot, PA-C       Or   acetaminophen (TYLENOL) suppository 650 mg  650 mg Rectal Q4H PRN Geronimo Boot, PA-C       albuterol (PROVENTIL) (2.5 MG/3ML) 0.083% nebulizer solution 3 mL  3 mL Nebulization Q6H PRN Geronimo Boot, PA-C   3 mL at 11/03/21 0830   amitriptyline (ELAVIL) tablet 50 mg  50 mg Oral QHS Luna, Stacy, PA-C       amLODipine (NORVASC) tablet 10 mg  10 mg Oral q morning [place] Musty, Stacy, PA-C   10 mg at 11/03/21 2979   atorvastatin (LIPITOR) tablet 40 mg  40 mg Oral QHS Geronimo Boot, PA-C   40 mg at 11/02/21 2220   celecoxib (CELEBREX) capsule 200 mg  200 mg Oral Q12H Geronimo Boot, PA-C   200 mg at 11/03/21 8921   DULoxetine (CYMBALTA) DR capsule 30 mg  30 mg Oral Daily [place] Musty, Stacy, PA-C   30 mg at 11/03/21 0916   fluticasone (FLONASE) 50 MCG/ACT nasal spray 1 spray  1 spray Each Nare Daily PRN [place] Musty, Stacy, PA-C       fluticasone furoate-vilanterol (BREO ELLIPTA) 100-25 MCG/ACT 1 puff  1 puff Inhalation Daily PRN Luna,  Stacy, PA-C       gabapentin (NEURONTIN) capsule 400 mg  400 mg Oral BID [place] Musty, Stacy, PA-C   400 mg at 11/03/21 0916   gabapentin (NEURONTIN) capsule 800 mg  800 mg Oral QHS Dorothe Pea, RPH   800 mg at 11/02/21 2221   linaclotide (LINZESS) capsule 145 mcg  145 mcg Oral Daily PRN Geronimo Boot, PA-C       menthol-cetylpyridinium (CEPACOL) lozenge 3 mg  1 lozenge Oral PRN [place] Musty, Stacy, PA-C       Or   phenol (CHLORASEPTIC) mouth spray 1 spray  1 spray Mouth/Throat PRN [place] Musty, Stacy, PA-C       methocarbamol (ROBAXIN) tablet 500 mg  500 mg Oral Q6H PRN Geronimo Boot, PA-C   500 mg at 11/02/21 1536   Or   methocarbamol (ROBAXIN) 500 mg in dextrose 5 % 50 mL IVPB  500 mg  Intravenous Q6H PRN Geronimo Boot, PA-C       oxyCODONE (Oxy IR/ROXICODONE) immediate release tablet 10 mg  10 mg Oral Q3H PRN Geronimo Boot, PA-C   10 mg at 11/03/21 0916   oxyCODONE (Oxy IR/ROXICODONE) immediate release tablet 5 mg  5 mg Oral Q3H PRN Geronimo Boot, PA-C   5 mg at 11/02/21 1748   pantoprazole (PROTONIX) EC tablet 40 mg  40 mg Oral Daily Luna, Stacy, PA-C   40 mg at 11/03/21 0917   sodium chloride flush (NS) 0.9 % injection 3 mL  3 mL Intravenous Q12H Geronimo Boot, PA-C   3 mL at 11/02/21 2242   sodium chloride flush (NS) 0.9 % injection 3 mL  3 mL Intravenous PRN Luna, Stacy, PA-C       [START ON 11/04/2021] umeclidinium-vilanterol (ANORO ELLIPTA) 62.5-25 MCG/ACT 1 puff  1 puff Inhalation Daily Geronimo Boot, PA-C         Discharge Medications: Please see discharge summary for a list of discharge medications.  Relevant Imaging Results:  Relevant Lab Results:   Additional Information ss# [id number]; no covid vaccines on file  Conception Oms, RN

## 2021-11-03 NOTE — Evaluation (Signed)
Physical Therapy Evaluation Patient Details Name: [name] MRN: [id number] DOB: [birth year] Today's Date: 11/03/2021  History of Present Illness  Pt is a 66 year old female s/p C3-4 ACDF  11/02/21; PMH: chronic opioid use, hx of cervical myelopathy, RA, CKD, HTN, COPD, smoking, CKD.   Clinical Impression  Patient alert, sitting EOB with OT upon PT entrance. Demonstrated moderate pain signs/symptoms especially with L sided neck pain and LUE. Pt poor historian, provided some conflicting information about PLOF; indicated she uses a rollator, sometimes a power chair, but lives alone at a senior living facility.   Sit <> stand from EOB, BSC over standard commode, and from recliner, CGA with RW. Pt did demonstrate decreased safety awareness (often removed hands to speak, gesture, or reach for items) and some general unsteadiness. She was ambulate ~32f with RW, CGA, and close chair follow. Exhibited variable gait with changes in step length, step height, and base of support.  Overall the patient demonstrated deficits (see "PT Problem List") that impede the patient's functional abilities, safety, and mobility and would benefit from skilled PT intervention. Recommendation at this time is SNF due to change from PLOF as well as decreased awareness of safety and deficits.       Recommendations for follow up therapy are one component of a multi-disciplinary discharge planning process, led by the attending physician.  Recommendations may be updated based on patient status, additional functional criteria and insurance authorization.  Follow Up Recommendations Skilled nursing-short term rehab (<3 hours/day) Can patient physically be transported by private vehicle: Yes    Assistance Recommended at Discharge Frequent or constant Supervision/Assistance  Patient can return home with the following  A little help with walking and/or transfers;Assistance with cooking/housework;Direct supervision/assist for  medications management;Assist for transportation;Help with stairs or ramp for entrance;Direct supervision/assist for financial management;A little help with bathing/dressing/bathroom    Equipment Recommendations BSC/3in1  Recommendations for Other Services       Functional Status Assessment Patient has had a recent decline in their functional status and demonstrates the ability to make significant improvements in function in a reasonable and predictable amount of time.     Precautions / Restrictions Precautions Precautions: Fall;Cervical Precaution Booklet Issued: No Precaution Comments: no brace needed per orders Restrictions Weight Bearing Restrictions: No      Mobility  Bed Mobility               General bed mobility comments: sitting EOB with OT    Transfers Overall transfer level: Needs assistance Equipment used: Rolling walker (2 wheels) Transfers: Sit to/from Stand Sit to Stand: Min guard, +2 safety/equipment                Ambulation/Gait Ambulation/Gait assistance: Min guard, +2 safety/equipment (chair follow) Gait Distance (Feet): 60 Feet Assistive device: Rolling walker (2 wheels)   Gait velocity: decreased     General Gait Details: variable; intermittent short step length bilaterally, steppage gait on RLE, overall unsteady, no true LOB  Stairs            Wheelchair Mobility    Modified Rankin (Stroke Patients Only)       Balance Overall balance assessment: Needs assistance Sitting-balance support: Feet supported Sitting balance-Leahy Scale: Fair     Standing balance support: Single extremity supported Standing balance-Leahy Scale: Fair Standing balance comment: able to doff and don briefs in standing near commode  Pertinent Vitals/Pain Pain Assessment Pain Assessment: Faces Faces Pain Scale: Hurts even more Pain Location: anterior cervical spine Pain Descriptors / Indicators:  Discomfort, Aching, Grimacing, Moaning Pain Intervention(s): Limited activity within patient's tolerance, Repositioned, Monitored during session, Patient requesting pain meds-RN notified    Home Living Family/patient expects to be discharged to:: Private residence Living Arrangements: Alone Available Help at Discharge: Family;Personal care attendant;Available PRN/intermittently Type of Home:  (senior living) Home Access: Level entry;Elevator       Home Layout: One level Home Equipment: Rollator (4 wheels);Cane - single point;Shower seat;Grab bars - tub/shower;Hand held shower head;Grab bars - toilet;Hospital bed;Wheelchair - power Additional Comments: some PLOF/set up from chart review and OT, pt reports she lives alone, has intermittent assist, aid for ADLs 1-2 days a week (should be 3)    Prior Function Prior Level of Function : Needs assist       Physical Assist : Mobility (physical);ADLs (physical) Mobility (physical): Gait ADLs (physical): IADLs Mobility Comments: amb with rollator, many falls in the last 6 months; pt also reports she drove her pwc off a curb recently ADLs Comments: per OT: pt reports modI with ADLs, wears pull ups     Hand Dominance   Dominant Hand: Right    Extremity/Trunk Assessment   Upper Extremity Assessment Upper Extremity Assessment: Defer to OT evaluation RUE Sensation: decreased light touch RUE Coordination: decreased fine motor LUE Deficits / Details: AROM shoulder flexion 1/2 full AROM, elbow/wrist appears WFL LUE Sensation: decreased light touch LUE Coordination: decreased fine motor    Lower Extremity Assessment Lower Extremity Assessment: Generalized weakness (able to lift extremities against gravity, did report improvement in leg pain s/p surgery)    Cervical / Trunk Assessment Cervical / Trunk Assessment: Neck Surgery  Communication   Communication: No difficulties  Cognition Arousal/Alertness: Awake/alert Behavior During  Therapy: WFL for tasks assessed/performed Overall Cognitive Status: No family/caregiver present to determine baseline cognitive functioning Area of Impairment: Following commands, Safety/judgement, Awareness                       Following Commands: Follows one step commands consistently Safety/Judgement: Decreased awareness of safety, Decreased awareness of deficits              General Comments General comments (skin integrity, edema, etc.): Pt with desat down to 80s with initial supine>sit, ?pleth, up to >95% on RA throughout rest of evaluation    Exercises Other Exercises Other Exercises: CGA with use of BSC over standard commode for pt to void. pericare with supervision   Assessment/Plan    PT Assessment Patient needs continued PT services  PT Problem List Decreased strength;Decreased mobility;Decreased safety awareness;Decreased range of motion;Decreased knowledge of precautions;Decreased activity tolerance;Pain;Decreased knowledge of use of DME;Decreased balance       PT Treatment Interventions DME instruction;Therapeutic activities;Gait training;Therapeutic exercise;Patient/family education;Stair training;Balance training;Functional mobility training;Neuromuscular re-education    PT Goals (Current goals can be found in the Care Plan section)  Acute Rehab PT Goals Patient Stated Goal: to go home PT Goal Formulation: With patient Time For Goal Achievement: 11/17/21 Potential to Achieve Goals: Good    Frequency 7X/week     Co-evaluation PT/OT/SLP Co-Evaluation/Treatment: Yes Reason for Co-Treatment: For patient/therapist safety;To address functional/ADL transfers PT goals addressed during session: Mobility/safety with mobility;Balance;Proper use of DME OT goals addressed during session: ADL's and self-care       AM-PAC PT "6 Clicks" Mobility  Outcome Measure Help needed turning from your back to your side while  in a flat bed without using bedrails?: A  Little Help needed moving from lying on your back to sitting on the side of a flat bed without using bedrails?: A Little Help needed moving to and from a bed to a chair (including a wheelchair)?: A Little Help needed standing up from a chair using your arms (e.g., wheelchair or bedside chair)?: A Little Help needed to walk in hospital room?: A Little Help needed climbing 3-5 steps with a railing? : A Lot 6 Click Score: 17    End of Session Equipment Utilized During Treatment: Gait belt Activity Tolerance: Patient tolerated treatment well Patient left: in chair;with chair alarm set;with call bell/phone within reach Nurse Communication: Mobility status PT Visit Diagnosis: Other abnormalities of gait and mobility (R26.89);History of falling (Z91.81);Muscle weakness (generalized) (M62.81);Pain;Difficulty in walking, not elsewhere classified (R26.2) Pain - Right/Left:  (midline) Pain - part of body:  (cervical)    Time: 7414-2395 PT Time Calculation (min) (ACUTE ONLY): 20 min   Charges:   PT Evaluation $PT Eval Low Complexity: 1 Low PT Treatments $Therapeutic Activity: 8-22 mins        Lieutenant Diego PT, DPT 10:19 AM,11/03/21

## 2021-11-04 DIAGNOSIS — M4802 Spinal stenosis, cervical region: Secondary | ICD-10-CM | POA: Diagnosis not present

## 2021-11-04 NOTE — Plan of Care (Signed)
  Problem: Education: Goal: Ability to verbalize activity precautions or restrictions will improve Outcome: Progressing Goal: Knowledge of the prescribed therapeutic regimen will improve Outcome: Progressing Goal: Understanding of discharge needs will improve Outcome: Progressing   Problem: Activity: Goal: Ability to avoid complications of mobility impairment will improve Outcome: Progressing Goal: Ability to tolerate increased activity will improve Outcome: Progressing Goal: Will remain free from falls Outcome: Progressing   Problem: Bowel/Gastric: Goal: Gastrointestinal status for postoperative course will improve Outcome: Progressing   Problem: Clinical Measurements: Goal: Ability to maintain clinical measurements within normal limits will improve Outcome: Progressing Goal: Postoperative complications will be avoided or minimized Outcome: Progressing Goal: Diagnostic test results will improve Outcome: Progressing   Problem: Pain Management: Goal: Pain level will decrease Outcome: Progressing   Problem: Skin Integrity: Goal: Will show signs of wound healing Outcome: Progressing   Problem: Health Behavior/Discharge Planning: Goal: Identification of resources available to assist in meeting health care needs will improve Outcome: Progressing   Problem: Bladder/Genitourinary: Goal: Urinary functional status for postoperative course will improve Outcome: Progressing   Problem: Education: Goal: Knowledge of General Education information will improve Description: Including pain rating scale, medication(s)/side effects and non-pharmacologic comfort measures Outcome: Progressing   Problem: Health Behavior/Discharge Planning: Goal: Ability to manage health-related needs will improve Outcome: Progressing   Problem: Clinical Measurements: Goal: Ability to maintain clinical measurements within normal limits will improve Outcome: Progressing Goal: Will remain free from  infection Outcome: Progressing Goal: Respiratory complications will improve Outcome: Progressing Goal: Cardiovascular complication will be avoided Outcome: Progressing   Problem: Activity: Goal: Risk for activity intolerance will decrease Outcome: Progressing   Problem: Nutrition: Goal: Adequate nutrition will be maintained Outcome: Progressing   Problem: Coping: Goal: Level of anxiety will decrease Outcome: Progressing

## 2021-11-04 NOTE — Plan of Care (Signed)
  Problem: Education: Goal: Ability to verbalize activity precautions or restrictions will improve Outcome: Progressing Goal: Knowledge of the prescribed therapeutic regimen will improve Outcome: Progressing Goal: Understanding of discharge needs will improve Outcome: Progressing   Problem: Activity: Goal: Ability to avoid complications of mobility impairment will improve Outcome: Progressing Goal: Ability to tolerate increased activity will improve Outcome: Progressing Goal: Will remain free from falls Outcome: Progressing   Problem: Clinical Measurements: Goal: Ability to maintain clinical measurements within normal limits will improve Outcome: Progressing Goal: Postoperative complications will be avoided or minimized Outcome: Progressing Goal: Diagnostic test results will improve Outcome: Progressing   Problem: Pain Management: Goal: Pain level will decrease Outcome: Progressing   Problem: Skin Integrity: Goal: Will show signs of wound healing Outcome: Progressing   Problem: Health Behavior/Discharge Planning: Goal: Identification of resources available to assist in meeting health care needs will improve Outcome: Progressing   Problem: Bladder/Genitourinary: Goal: Urinary functional status for postoperative course will improve Outcome: Progressing   Problem: Education: Goal: Knowledge of General Education information will improve Description: Including pain rating scale, medication(s)/side effects and non-pharmacologic comfort measures Outcome: Progressing   Problem: Health Behavior/Discharge Planning: Goal: Ability to manage health-related needs will improve Outcome: Progressing   Problem: Clinical Measurements: Goal: Ability to maintain clinical measurements within normal limits will improve Outcome: Progressing Goal: Will remain free from infection Outcome: Progressing Goal: Diagnostic test results will improve Outcome: Progressing Goal: Respiratory  complications will improve Outcome: Progressing   Problem: Activity: Goal: Risk for activity intolerance will decrease Outcome: Progressing   Problem: Nutrition: Goal: Adequate nutrition will be maintained Outcome: Progressing   Problem: Coping: Goal: Level of anxiety will decrease Outcome: Progressing

## 2021-11-04 NOTE — Progress Notes (Signed)
Physical Therapy Treatment Patient Details Name: [name] MRN: [id number] DOB: [birth year] Today's Date: 11/04/2021   History of Present Illness Pt is a 66 year old female s/p C3-4 ACDF  11/02/21; PMH: chronic opioid use, hx of cervical myelopathy, RA, CKD, HTN, COPD, smoking, CKD.    PT Comments    Patient noted to be sleepy/lethargic throughout session, quickly drops back to sleep if not spoken too. HR and spO2 WFLs on [place]. Pt more challenged today with mobility because of this, RN aware and pt status and complaints of 9/10 pain (anterior neck, throat). Supine to sit with minA, pt reaches for support. Poor balance but with cues and time, repositioning in midline, progressed to CGA. Sit <> stand with RW and CGAx2 twice, some unsteadiness noted. Ambulation deferred this AM due to some lethargy. The patient would benefit from further skilled PT intervention to continue to progress towards goals. Recommendation remains appropriate.       Recommendations for follow up therapy are one component of a multi-disciplinary discharge planning process, led by the attending physician.  Recommendations may be updated based on patient status, additional functional criteria and insurance authorization.  Follow Up Recommendations  Skilled nursing-short term rehab (<3 hours/day) Can patient physically be transported by private vehicle: Yes   Assistance Recommended at Discharge Frequent or constant Supervision/Assistance  Patient can return home with the following A little help with walking and/or transfers;Assistance with cooking/housework;Direct supervision/assist for medications management;Assist for transportation;Help with stairs or ramp for entrance;Direct supervision/assist for financial management;A little help with bathing/dressing/bathroom   Equipment Recommendations  BSC/3in1    Recommendations for Other Services       Precautions / Restrictions Precautions Precautions:  Fall;Cervical Precaution Booklet Issued: No Precaution Comments: no brace needed per orders Restrictions Weight Bearing Restrictions: No     Mobility  Bed Mobility Overal bed mobility: Needs Assistance Bed Mobility: Supine to Sit     Supine to sit: Min assist     General bed mobility comments: Pt reaches for assistance    Transfers Overall transfer level: Needs assistance Equipment used: Rolling walker (2 wheels) Transfers: Sit to/from Stand Sit to Stand: Min guard, +2 safety/equipment           General transfer comment: extra time, reliance on UE support, some mild unsteadiness noted with initial standing    Ambulation/Gait               General Gait Details: deferred due to lethargy   Stairs             Wheelchair Mobility    Modified Rankin (Stroke Patients Only)       Balance Overall balance assessment: Needs assistance Sitting-balance support: Feet supported, Bilateral upper extremity supported Sitting balance-Leahy Scale: Poor Sitting balance - Comments: initially noted for posterior lean, minA to correct, with cues and time corrects and maintains balance with CGA   Standing balance support: Reliant on assistive device for balance Standing balance-Leahy Scale: Poor Standing balance comment: doff and don briefs in standing, CGA                            Cognition Arousal/Alertness: Lethargic Behavior During Therapy: WFL for tasks assessed/performed Overall Cognitive Status: No family/caregiver present to determine baseline cognitive functioning Area of Impairment: Following commands, Safety/judgement, Awareness                       Following Commands: Follows one  step commands consistently Safety/Judgement: Decreased awareness of safety, Decreased awareness of deficits     General Comments: pt much more sleepy this session, does drop quickly to sleep when not interacted with        Exercises Other  Exercises Other Exercises: doff and don brief in standing with CGA, PT assisted with gown    General Comments        Pertinent Vitals/Pain Pain Assessment Pain Assessment: 0-10 Pain Score: 9  Pain Location: throat, swelling Pain Descriptors / Indicators: Grimacing, Guarding, Sore Pain Intervention(s): Limited activity within patient's tolerance, Monitored during session, Repositioned, Premedicated before session, Patient requesting pain meds-RN notified    Home Living                          Prior Function            PT Goals (current goals can now be found in the care plan section) Progress towards PT goals: Progressing toward goals    Frequency    7X/week      PT Plan Current plan remains appropriate    Co-evaluation              AM-PAC PT "6 Clicks" Mobility   Outcome Measure  Help needed turning from your back to your side while in a flat bed without using bedrails?: A Little Help needed moving from lying on your back to sitting on the side of a flat bed without using bedrails?: A Little Help needed moving to and from a bed to a chair (including a wheelchair)?: A Little Help needed standing up from a chair using your arms (e.g., wheelchair or bedside chair)?: A Little Help needed to walk in hospital room?: A Little Help needed climbing 3-5 steps with a railing? : A Lot 6 Click Score: 17    End of Session Equipment Utilized During Treatment: Gait belt Activity Tolerance: Patient tolerated treatment well Patient left: in chair;with chair alarm set;with call bell/phone within reach Nurse Communication: Mobility status PT Visit Diagnosis: Other abnormalities of gait and mobility (R26.89);History of falling (Z91.81);Muscle weakness (generalized) (M62.81);Pain;Difficulty in walking, not elsewhere classified (R26.2) Pain - Right/Left:  (midline) Pain - part of body:  (cervical)     Time: 8099-8338 PT Time Calculation (min) (ACUTE ONLY): 25  min  Charges:  $Therapeutic Activity: 23-37 mins                     Lieutenant Diego PT, DPT 10:36 AM,11/04/21

## 2021-11-04 NOTE — Evaluation (Signed)
Occupational Therapy Evaluation Patient Details Name: [name] MRN: [id number] DOB: 01/04/1956 Today's Date: 11/04/2021   History of Present Illness Pt is a 66 year old female s/p C3-4 ACDF  11/02/21; PMH: chronic opioid use, hx of cervical myelopathy, RA, CKD, HTN, COPD, smoking, CKD.   Clinical Impression   Chart reviewed, pt greeted in bed agreeable to OT tx session with encouragement. Tx session targeted improving safety during ADLs via task oriented training. Frequent vcs, significant education required during bed mobility, mobility for safety, body mechanics. Grooming tasks completed at sink with CGA for dynamic standing tasks, amb in room approx 15' with RW with CGA. Pt with poor safety awareness, awareness of deficits throughout however improvements noted in standing tolerance on this date. Pt is left as received, all needs met. OT will continue to follow acutely.      Recommendations for follow up therapy are one component of a multi-disciplinary discharge planning process, led by the attending physician.  Recommendations may be updated based on patient status, additional functional criteria and insurance authorization.   Follow Up Recommendations  Skilled nursing-short term rehab (<3 hours/day)    Assistance Recommended at Discharge Frequent or constant Supervision/Assistance  Patient can return home with the following A lot of help with walking and/or transfers;A little help with bathing/dressing/bathroom    Functional Status Assessment  Patient has had a recent decline in their functional status and demonstrates the ability to make significant improvements in function in a reasonable and predictable amount of time.  Equipment Recommendations  BSC/3in1    Recommendations for Other Services       Precautions / Restrictions Precautions Precautions: Fall;Cervical Precaution Booklet Issued: No Precaution Comments: no brace needed per orders Restrictions Weight  Bearing Restrictions: No      Mobility Bed Mobility Overal bed mobility: Needs Assistance Bed Mobility: Supine to Sit, Sit to Supine     Supine to sit: Min guard Sit to supine: Min guard   General bed mobility comments: frequent vcs for body mechanics    Transfers Overall transfer level: Needs assistance Equipment used: Rolling walker (2 wheels) Transfers: Sit to/from Stand Sit to Stand: Min guard           General transfer comment: amb in room approx 15' with RW with CGA, frequent vcs for safety      Balance Overall balance assessment: Needs assistance Sitting-balance support: Feet supported, Bilateral upper extremity supported Sitting balance-Leahy Scale: Fair     Standing balance support: Reliant on assistive device for balance Standing balance-Leahy Scale: Fair                             ADL either performed or assessed with clinical judgement   ADL Overall ADL's : Needs assistance/impaired     Grooming: Wash/dry hands;Oral care;Standing;Supervision/safety Grooming Details (indicate cue type and reason): frequent verbal cues for safety during dynamic standing tasks                 Toilet Transfer: Min guard;Ambulation;Rolling walker (2 wheels) Toilet Transfer Details (indicate cue type and reason): simulated         Functional mobility during ADLs: Min guard;Rolling walker (2 wheels);Cueing for safety       Vision         Perception     Praxis      Pertinent Vitals/Pain Pain Assessment Pain Assessment: Faces Faces Pain Scale: Hurts even more Pain Location: throat, cervical spine Pain  Descriptors / Indicators: Grimacing, Guarding, Sore Pain Intervention(s): Limited activity within patient's tolerance, Monitored during session, Repositioned     Hand Dominance     Extremity/Trunk Assessment             Communication     Cognition Arousal/Alertness: Awake/alert Behavior During Therapy: Impulsive Overall  Cognitive Status: No family/caregiver present to determine baseline cognitive functioning Area of Impairment: Attention, Following commands, Safety/judgement, Awareness, Problem solving                   Current Attention Level: Sustained   Following Commands: Follows one step commands with increased time Safety/Judgement: Decreased awareness of safety, Decreased awareness of deficits Awareness: Emergent Problem Solving: Requires verbal cues, Requires tactile cues       General Comments  pt with desat down to 83% on RA during mobility, >90% on 2L via [place] throughout mobility    Exercises     Shoulder Instructions      Home Living                                          Prior Functioning/Environment                          OT Problem List:        OT Treatment/Interventions: Self-care/ADL training;DME and/or AE instruction;Therapeutic activities;Balance training;Therapeutic exercise;Energy conservation;Patient/family education    OT Goals(Current goals can be found in the care plan section)    OT Frequency: Min 3X/week    Co-evaluation              AM-PAC OT "6 Clicks" Daily Activity     Outcome Measure Help from another person eating meals?: None Help from another person taking care of personal grooming?: None Help from another person toileting, which includes using toliet, bedpan, or urinal?: A Lot Help from another person bathing (including washing, rinsing, drying)?: A Lot Help from another person to put on and taking off regular upper body clothing?: A Little Help from another person to put on and taking off regular lower body clothing?: A Little 6 Click Score: 18   End of Session Equipment Utilized During Treatment: Gait belt;Rolling walker (2 wheels);Oxygen Nurse Communication: Mobility status  Activity Tolerance: Patient tolerated treatment well Patient left: in bed;with call bell/phone within reach;with bed alarm  set  OT Visit Diagnosis: Unsteadiness on feet (R26.81);Muscle weakness (generalized) (M62.81);Repeated falls (R29.6);Other abnormalities of gait and mobility (R26.89)                Time: 7408-1448 OT Time Calculation (min): 18 min Charges:  OT General Charges $OT Visit: 1 Visit OT Treatments $Self Care/Home Management : 8-22 mins Shanon Payor, OTD OTR/L  11/04/21, 1:50 PM

## 2021-11-04 NOTE — Progress Notes (Addendum)
Spoke with patient to review bed offers. Patient would like to discharge to [facility]. She has been advised Josem Kaufmann is still needed. [employer] started.   4:40pm  Per [facility] patient approved for SNF 10/25. MD notified.

## 2021-11-04 NOTE — Progress Notes (Signed)
    Attending Progress Note  History: [name] is here for cervical myelopathy  POD2: Having expected posterior neck pain. POD1: Doing well.  Has some SOB (baseline)  Physical Exam: Vitals:   11/03/21 2311 11/04/21 0453  BP: (!) 143/84 106/70  Pulse: 82 66  Resp: 17 18  Temp: 97.8 F (36.6 C) (!) 97.5 F (36.4 C)  SpO2: 93% 94%    AA Ox3 CNI  Strength:5/5 throughout BUE except grip (4+)  Data:  Other tests/results: n/a  Assessment/Plan:  [name] is doing well after ACDF for cervical myelopathy  - mobilize - pain control - DVT prophylaxis - PTOT    Meade Maw MD, [facility] Department of Neurosurgery

## 2021-11-05 DIAGNOSIS — M4802 Spinal stenosis, cervical region: Secondary | ICD-10-CM | POA: Diagnosis not present

## 2021-11-05 MED ORDER — METHOCARBAMOL 500 MG PO TABS: 500.0000 mg | ORAL_TABLET | Freq: Four times a day (QID) | ORAL | 0 refills | Status: DC | PRN

## 2021-11-05 MED ORDER — OXYCODONE HCL 5 MG PO TABS: 5.0000 mg | ORAL_TABLET | ORAL | 0 refills | Status: DC | PRN

## 2021-11-05 MED ORDER — CELECOXIB 200 MG PO CAPS: 200.0000 mg | ORAL_CAPSULE | Freq: Two times a day (BID) | ORAL | 0 refills | Status: DC

## 2021-11-05 MED ORDER — OXYCODONE-ACETAMINOPHEN 10-325 MG PO TABS: 1.0000 | ORAL_TABLET | ORAL | 0 refills | Status: AC | PRN

## 2021-11-05 NOTE — Discharge Instructions (Signed)
Your surgeon has performed an operation on your cervical spine (neck) to relieve pressure on the spinal cord and/or nerves. This involved making an incision in the front of your neck and removing one or more of the discs that support your spine. Next, a small piece of bone, a titanium plate, and screws were used to fuse two or more of the vertebrae (bones) together.  The following are instructions to help in your recovery once you have been discharged from the hospital. Even if you feel well, it is important that you follow these activity guidelines. If you do not let your neck heal properly from the surgery, you can increase the chance of return of your symptoms and other complications.  * Do not take anti-inflammatory medications for 3 months after surgery (naproxen [Aleve], ibuprofen [Advil, Motrin], etc.). These medications can prevent your bones from healing properly.  Celebrex, if prescribed, is ok to take.  Activity    No bending, lifting, or twisting ("BLT"). Avoid lifting objects heavier than 10 pounds (gallon milk jug).  Where possible, avoid household activities that involve lifting, bending, reaching, pushing, or pulling such as laundry, vacuuming, grocery shopping, and childcare. Try to arrange for help from friends and family for these activities while your back heals.  Increase physical activity slowly as tolerated.  Taking short walks is encouraged, but avoid strenuous exercise. Do not jog, run, bicycle, lift weights, or participate in any other exercises unless specifically allowed by your doctor.  Talk to your doctor before resuming sexual activity.  You should not drive until cleared by your doctor.  Until released by your doctor, you should not return to work or school.  You should rest at home and let your body heal.   You may shower three days after your surgery.  After showering, lightly dab your incision dry. Do not take a tub bath or go swimming until approved by your  doctor at your follow-up appointment.  If your doctor ordered a cervical collar (neck brace) for you, you should wear it whenever you are out of bed. You may remove it when lying down or sleeping, but you should wear it at all other times. Not all neck surgeries require a cervical collar.  If you smoke, we strongly recommend that you quit.  Smoking has been proven to interfere with normal bone healing and will dramatically reduce the success rate of your surgery. Please contact [facility] (800-QUIT-NOW) and use the resources at www.[facility].com for assistance in stopping smoking.  Surgical Incision   If you have a dressing on your incision, you may remove it two days after your surgery. Keep your incision area clean and dry.  If you have staples or stitches on your incision, you should have a follow up scheduled for removal. If you do not have staples or stitches, you will have steri-strips (small pieces of surgical tape) or Dermabond glue. The steri-strips/glue should begin to peel away within about a week (it is fine if the steri-strips fall off before then). If the strips are still in place one week after your surgery, you may gently remove them.  Diet           You may return to your usual diet. However, you may experience discomfort when swallowing in the first month after your surgery. This is normal. You may find that softer foods are more comfortable for you to swallow. Be sure to stay hydrated.  When to Contact Us  You may experience pain in your   neck and/or pain between your shoulder blades. This is normal and should improve in the next few weeks with the help of pain medication, muscle relaxers, and rest. Some patients report that a warm compress on the back of the neck or between the shoulder blades helps.  However, should you experience any of the following, contact us immediately: New numbness or weakness Pain that is progressively getting worse, and is not relieved by your pain  medication, muscle relaxers, rest, and warm compresses Bleeding, redness, swelling, pain, or drainage from surgical incision Chills or flu-like symptoms Fever greater than 101.0 F (38.3 C) Inability to eat, drink fluids, or take medications Problems with bowel or bladder functions Difficulty breathing or shortness of breath Warmth, tenderness, or swelling in your calf Contact Information During office hours (Monday-Friday 9 am to 5 pm), please call your physician at [phone] and ask for Kendelyn Jean After hours and weekends, please call [phone] and speak with the neurosurgeon on call For a life-threatening emergency, call 911  

## 2021-11-05 NOTE — Progress Notes (Signed)
    Attending Progress Note  History: Karia Ehresman is here for cervical myelopathy  POD3: Continued neck pain but improving POD2: Having expected posterior neck pain. POD1: Doing well.  Has some SOB (baseline)  Physical Exam: Vitals:   11/04/21 2326 11/05/21 0749  BP: 95/60 109/66  Pulse: 79 66  Resp: 20 18  Temp: 98.9 F (37.2 C) 97.7 F (36.5 C)  SpO2: 99% 100%    AA Ox3 CNI  Strength:5/5 throughout BUE except grip (4+)  Data:  Other tests/results: n/a  Assessment/Plan:  Amazin Pincock is doing well after ACDF for cervical myelopathy  - mobilize - pain control - DVT prophylaxis - PTOT    Meade Maw MD, [facility] Department of Neurosurgery

## 2021-11-05 NOTE — Progress Notes (Signed)
Occupational Therapy Treatment Patient Details Name: [name] MRN: [id number] DOB: 03-03-1955 Today's Date: 11/05/2021   History of present illness Pt is a 66 year old female s/p C3-4 ACDF  11/02/21; PMH: chronic opioid use, hx of cervical myelopathy, RA, CKD, HTN, COPD, smoking, CKD.   OT comments  Patient in bed asleep upon arrival, agreeable to OT treatment. Patient very sleepy, stating she did not get much sleep. Patient with c/o pain in right knee and neck, rating 8/10. Nurse notified, patient medicated during session. Patient was able to perform bed mobility and transfer supine> sit with min guard using RW.  Ambulated ~30 ft  in room to bathroom using RW to [facility] and perform peri-care with min guard/ supervision. Required VC throughout session to adhere to cervical precautions. Grooming tasks performed with with supervision for safety (oral care, washing hands) Patient was able to perform UB dressing with set up and LB dressing with min A due to precautions. Patient left in recliner with call bell in reach, chair alarm set, and all needs met.    Recommendations for follow up therapy are one component of a multi-disciplinary discharge planning process, led by the attending physician.  Recommendations may be updated based on patient status, additional functional criteria and insurance authorization.    Follow Up Recommendations  Skilled nursing-short term rehab (<3 hours/day)    Assistance Recommended at Discharge Frequent or constant Supervision/Assistance  Patient can return home with the following  A lot of help with walking and/or transfers;A little help with bathing/dressing/bathroom   Equipment Recommendations  Other (comment) (Defer to next venue of care.)       Precautions / Restrictions Precautions Precautions: Fall;Cervical Precaution Comments: no brace needed per orders Restrictions Weight Bearing Restrictions: No       Mobility Bed Mobility Overal bed mobility:  Needs Assistance Bed Mobility: Supine to Sit     Supine to sit: Min guard          Transfers Overall transfer level: Needs assistance Equipment used: Rolling walker (2 wheels) Transfers: Sit to/from Stand Sit to Stand: Min guard                 Balance Overall balance assessment: Needs assistance Sitting-balance support: Feet supported, Bilateral upper extremity supported Sitting balance-Leahy Scale: Good     Standing balance support: Bilateral upper extremity supported Standing balance-Leahy Scale: Fair                             ADL either performed or assessed with clinical judgement   ADL       Grooming: Oral care;Supervision/safety           Upper Body Dressing : Set up;Sitting   Lower Body Dressing: Minimal assistance;Cueing for safety;Sit to/from stand   Toilet Transfer: Min guard;Rolling walker (2 wheels);BSC/3in1   Toileting- Water quality scientist and Hygiene: Min guard;Supervision/safety       Functional mobility during ADLs: Min guard;Rolling walker (2 wheels);Cueing for safety      Extremity/Trunk Assessment Upper Extremity Assessment Upper Extremity Assessment: Overall WFL for tasks assessed   Lower Extremity Assessment Lower Extremity Assessment: Generalized weakness        Vision Patient Visual Report: No change from baseline            Cognition Arousal/Alertness: Awake/alert Behavior During Therapy: WFL for tasks assessed/performed Overall Cognitive Status: No family/caregiver present to determine baseline cognitive functioning  Pertinent Vitals/ Pain       Pain Assessment Pain Score: 8  Pain Location: throat, cervical spine Pain Descriptors / Indicators: Grimacing, Guarding, Sore Pain Intervention(s): Limited activity within patient's tolerance, Patient requesting pain meds-RN notified, Repositioned, Monitored during session  Home  Living Family/patient expects to be discharged to:: Private residence Living Arrangements: Alone Available Help at Discharge: Family;Personal care attendant;Available PRN/intermittently   Home Access: Level entry;Elevator     Home Layout: One level     Bathroom Shower/Tub: Occupational psychologist: Standard Bathroom Accessibility: Yes   Home Equipment: Rollator (4 wheels);Cane - single point;Shower seat;Grab bars - tub/shower;Hand held shower head;Grab bars - toilet;Hospital bed;Wheelchair - power   Additional Comments: some PLOF/set up from chart review and OT, pt reports she lives alone, has intermittent assist, aid for ADLs 1-2 days a week (should be 3)          Frequency  Min 3X/week        Progress Toward Goals  OT Goals(current goals can now be found in the care plan section)     Acute Rehab OT Goals Patient Stated Goal: return home OT Goal Formulation: With patient Time For Goal Achievement: 11/17/21 Potential to Achieve Goals: Good   AM-PAC OT "6 Clicks" Daily Activity     Outcome Measure   Help from another person eating meals?: None Help from another person taking care of personal grooming?: None Help from another person toileting, which includes using toliet, bedpan, or urinal?: A Little Help from another person bathing (including washing, rinsing, drying)?: A Little Help from another person to put on and taking off regular upper body clothing?: A Little Help from another person to put on and taking off regular lower body clothing?: A Lot 6 Click Score: 19    End of Session Equipment Utilized During Treatment: Rolling walker (2 wheels)  OT Visit Diagnosis: Unsteadiness on feet (R26.81);Muscle weakness (generalized) (M62.81);Repeated falls (R29.6);Other abnormalities of gait and mobility (R26.89)   Activity Tolerance Patient tolerated treatment well   Patient Left in chair;with call bell/phone within reach;with chair alarm set   Nurse  Communication Mobility status;Patient requests pain meds;Precautions        Time: 1005-1027 OT Time Calculation (min): 22 min  Charges:      Tomasa Blase, OTS 11/05/2021, 10:39 AM

## 2021-11-05 NOTE — TOC Progression Note (Addendum)
Transition of Care [facility]) - Progression Note    Patient Details  Name: [name] MRN: [id number] Date of Birth: [birth year]  Transition of Care [facility]) CM/SW Contact  Laurena Slimmer, RN Phone Number: 11/05/2021, 9:24 AM  Clinical Narrative:    Attempt to reach admissions at [facility] to confirm dishcarge for today. Left message for [facility] requesting a call back to this RNCM.   Spoke with patient to advised she would be discharging to [facility] today. Attempted contacting patient's daughter Elon Spanner. No answer. Left a message.   Spoke with Janie at [facility]. Patient assigned room number 202. Nurse will call report to [phone]. Discharge summary sent in the [place] . Face sheet and medical necessity forms placed in EMS packet.   10:07am Spoke with Rolanda. She was advised of discharge to [facility]. She will transport the patient later this evening.   11:47am Spoke with patient's daughter to confirm transport. Patient daughter advised per conversation with PT patent would be appropriate for EMS transport. EMS arranged for 2pm. Nurse notified. TOC signing off.         Expected Discharge Plan and Services           Expected Discharge Date: 11/05/21                                     Social Determinants of Health (SDOH) Interventions    Readmission Risk Interventions    04/05/2021    5:04 PM 11/13/2020   10:26 AM 11/10/2020   11:54 AM  Readmission Risk Prevention Plan  Transportation Screening Complete Complete Complete  PCP or Specialist Appt within 5-7 Days   Complete  PCP or Specialist Appt within 3-5 Days  Complete   Home Care Screening   Complete  Medication Review (RN CM)   Complete  HRI or Home Care Consult  Complete   Social Work Consult for [place] Planning/Counseling  Complete   Palliative Care Screening  Complete   Medication Review Press photographer) Complete Complete   PCP or Specialist appointment within 3-5 days of discharge  Complete    HRI or [place] Complete    SW Recovery Care/Counseling Consult Complete    Palliative Care Screening Complete    Skilled Nursing Facility Complete

## 2021-11-05 NOTE — Progress Notes (Addendum)
Physical Therapy Treatment Patient Details Name: [name] MRN: [id number] DOB: July 07, 1955 Today's Date: 11/05/2021   History of Present Illness Pt is a 66 year old female s/p C3-4 ACDF  11/02/21; PMH: chronic opioid use, hx of cervical myelopathy, RA, CKD, HTN, COPD, smoking, CKD.    PT Comments    Pt found in chair upon PT entry. Pt impulsive and initiated Sit<>Stand multiple times with RW and CGA. Pt ambulated 60 ft with CGA, chair follow, and 2 L of oxygen via nasal cannula with an increase in dizziness that was monitored throughout session and HR and spO2 remained WNL. Pt presented unsteady with an occasional body tremor. Pt would benefit from skilled physical therapy to address the listed deficits (see below) to increase functionality and independence with ADLs. Current recommendation remains SNF.        Recommendations for follow up therapy are one component of a multi-disciplinary discharge planning process, led by the attending physician.  Recommendations may be updated based on patient status, additional functional criteria and insurance authorization.  Follow Up Recommendations  Skilled nursing-short term rehab (<3 hours/day) Can patient physically be transported by private vehicle: Yes   Assistance Recommended at Discharge Frequent or constant Supervision/Assistance  Patient can return home with the following Assistance with cooking/housework;Direct supervision/assist for medications management;Assist for transportation;Help with stairs or ramp for entrance;Direct supervision/assist for financial management;A little help with walking and/or transfers;A little help with bathing/dressing/bathroom   Equipment Recommendations  BSC/3in1    Recommendations for Other Services       Precautions / Restrictions Precautions Precautions: Fall;Cervical Precaution Booklet Issued: No Precaution Comments: no brace needed per orders Restrictions Weight Bearing Restrictions: No      Mobility  Bed Mobility                    Transfers Overall transfer level: Needs assistance Equipment used: Rolling walker (2 wheels) Transfers: Sit to/from Stand Sit to Stand: Min guard                Ambulation/Gait Ambulation/Gait assistance: Min guard, +2 safety/equipment Gait Distance (Feet): 60 Feet Assistive device: Rolling walker (2 wheels)         General Gait Details: decreased step length   Stairs             Wheelchair Mobility    Modified Rankin (Stroke Patients Only)       Balance Overall balance assessment: Needs assistance Sitting-balance support: Feet supported Sitting balance-Leahy Scale: Good     Standing balance support: Bilateral upper extremity supported, Reliant on assistive device for balance Standing balance-Leahy Scale: Fair                              Cognition Arousal/Alertness: Awake/alert Behavior During Therapy: WFL for tasks assessed/performed Overall Cognitive Status: Within Functional Limits for tasks assessed Area of Impairment: Safety/judgement, Awareness                   Current Attention Level: Sustained   Following Commands: Follows one step commands with increased time Safety/Judgement: Decreased awareness of safety, Decreased awareness of deficits   Problem Solving: Requires verbal cues, Slow processing General Comments: pt more alert this session and able to stay awake        Exercises General Exercises - Lower Extremity Hip Flexion/Marching: 10 reps, AROM, Both    General Comments        Pertinent Vitals/Pain Pain Assessment  Pain Assessment: 0-10 Pain Score: 8  Pain Location: throat, posterior cervical spine Pain Descriptors / Indicators: Aching Pain Intervention(s): Limited activity within patient's tolerance, Premedicated before session    Home Living Family/patient expects to be discharged to:: Private residence Living Arrangements:  Alone Available Help at Discharge: Family;Personal care attendant;Available PRN/intermittently   Home Access: Level entry;Elevator       Home Layout: One level Home Equipment: Rollator (4 wheels);Cane - single point;Shower seat;Grab bars - tub/shower;Hand held shower head;Grab bars - toilet;Hospital bed;Wheelchair - power Additional Comments: some PLOF/set up from chart review and OT, pt reports she lives alone, has intermittent assist, aid for ADLs 1-2 days a week (should be 3)    Prior Function            PT Goals (current goals can now be found in the care plan section) Acute Rehab PT Goals Patient Stated Goal: to go home PT Goal Formulation: With patient Time For Goal Achievement: 11/17/21 Potential to Achieve Goals: Good Progress towards PT goals: Progressing toward goals    Frequency    7X/week      PT Plan Current plan remains appropriate    Co-evaluation     PT goals addressed during session: Mobility/safety with mobility;Balance;Proper use of DME;Strengthening/ROM        AM-PAC PT "6 Clicks" Mobility   Outcome Measure  Help needed turning from your back to your side while in a flat bed without using bedrails?: A Little Help needed moving from lying on your back to sitting on the side of a flat bed without using bedrails?: A Little Help needed moving to and from a bed to a chair (including a wheelchair)?: A Little Help needed standing up from a chair using your arms (e.g., wheelchair or bedside chair)?: A Little Help needed to walk in hospital room?: A Little Help needed climbing 3-5 steps with a railing? : A Lot 6 Click Score: 17    End of Session Equipment Utilized During Treatment: Gait belt Activity Tolerance: Patient tolerated treatment well Patient left: in chair;with chair alarm set;with call bell/phone within reach Nurse Communication: Mobility status PT Visit Diagnosis: Other abnormalities of gait and mobility (R26.89);History of falling  (Z91.81);Muscle weakness (generalized) (M62.81);Pain;Difficulty in walking, not elsewhere classified (R26.2)     Time: 1100-1118 PT Time Calculation (min) (ACUTE ONLY): 18 min  Charges:                           Claiborne Billings O'Daniel, SPT 11/05/2021, 12:04 PM

## 2021-11-05 NOTE — Evaluation (Deleted)
Physical Therapy Evaluation Patient Details Name: [name] MRN: [id number] DOB: 06/04/1955 Today's Date: 11/05/2021  History of Present Illness  Pt is a 66 year old female s/p C3-4 ACDF  11/02/21; PMH: chronic opioid use, hx of cervical myelopathy, RA, CKD, HTN, COPD, smoking, CKD.  Clinical Impression     Pt found in chair upon PT entry. Pnt impulsive and initiated Sit<>Stand multiple times with RW and CGA. Pt ambulated 60 ft with CGA, chair follow, and 2 L of oxygen via nasal cannula with an increase in dizziness that was monitored throughout session and vitals remained WNL. Pt presented unsteady with an occasional body tremor. Pt would benefit from skilled physical therapy to address the listed deficits (see below) to increase functionality and independence with ADLs. Current recommendation remains SNF.      Recommendations for follow up therapy are one component of a multi-disciplinary discharge planning process, led by the attending physician.  Recommendations may be updated based on patient status, additional functional criteria and insurance authorization.  Follow Up Recommendations Skilled nursing-short term rehab (<3 hours/day) Can patient physically be transported by private vehicle: Yes    Assistance Recommended at Discharge Frequent or constant Supervision/Assistance  Patient can return home with the following  Assistance with cooking/housework;Direct supervision/assist for medications management;Assist for transportation;Help with stairs or ramp for entrance;Direct supervision/assist for financial management;A little help with walking and/or transfers;A little help with bathing/dressing/bathroom    Equipment Recommendations BSC/3in1  Recommendations for Other Services       Functional Status Assessment       Precautions / Restrictions Precautions Precautions: Fall;Cervical Precaution Booklet Issued: No Precaution Comments: no brace needed per  orders Restrictions Weight Bearing Restrictions: No      Mobility  Bed Mobility                    Transfers Overall transfer level: Needs assistance Equipment used: Rolling walker (2 wheels) Transfers: Sit to/from Stand Sit to Stand: Min guard                Ambulation/Gait Ambulation/Gait assistance: Min guard, +2 safety/equipment Gait Distance (Feet): 60 Feet Assistive device: Rolling walker (2 wheels)         General Gait Details: decreased step length  Stairs            Wheelchair Mobility    Modified Rankin (Stroke Patients Only)       Balance Overall balance assessment: Needs assistance Sitting-balance support: Feet supported Sitting balance-Leahy Scale: Good     Standing balance support: Bilateral upper extremity supported, Reliant on assistive device for balance Standing balance-Leahy Scale: Fair                               Pertinent Vitals/Pain Pain Assessment Pain Assessment: 0-10 Pain Score: 8  Pain Location: throat, posterior cervical spine Pain Descriptors / Indicators: Aching Pain Intervention(s): Limited activity within patient's tolerance, Premedicated before session, Monitored during session    Home Living Family/patient expects to be discharged to:: Private residence Living Arrangements: Alone Available Help at Discharge: Family;Personal care attendant;Available PRN/intermittently   Home Access: Level entry;Elevator       Home Layout: One level Home Equipment: Rollator (4 wheels);Cane - single point;Shower seat;Grab bars - tub/shower;Hand held shower head;Grab bars - toilet;Hospital bed;Wheelchair - power Additional Comments: some PLOF/set up from chart review and OT, pt reports she lives alone, has intermittent assist, aid for ADLs 1-2 days  a week (should be 3)    Prior Function Prior Level of Function : Needs assist       Physical Assist : Mobility (physical);ADLs (physical) Mobility  (physical): Gait   Mobility Comments: amb with rollator, many falls in the last 6 months; pt also reports she drove her pwc off a curb recently ADLs Comments: per OT: pt reports modI with ADLs, wears pull ups     Hand Dominance   Dominant Hand: Right    Extremity/Trunk Assessment   Upper Extremity Assessment Upper Extremity Assessment: Overall WFL for tasks assessed    Lower Extremity Assessment Lower Extremity Assessment: Generalized weakness       Communication   Communication: No difficulties  Cognition Arousal/Alertness: Awake/alert Behavior During Therapy: WFL for tasks assessed/performed Overall Cognitive Status: Within Functional Limits for tasks assessed Area of Impairment: Safety/judgement, Awareness                   Current Attention Level: Sustained   Following Commands: Follows one step commands with increased time Safety/Judgement: Decreased awareness of safety, Decreased awareness of deficits   Problem Solving: Requires verbal cues, Slow processing General Comments: pt more alert this session and able to stay awake        General Comments      Exercises General Exercises - Lower Extremity Hip Flexion/Marching: 10 reps, AROM, Both   Assessment/Plan    PT Assessment    PT Problem List         PT Treatment Interventions      PT Goals (Current goals can be found in the Care Plan section)  Acute Rehab PT Goals Patient Stated Goal: to go home PT Goal Formulation: With patient Time For Goal Achievement: 11/17/21 Potential to Achieve Goals: Good    Frequency 7X/week     Co-evaluation               AM-PAC PT "6 Clicks" Mobility  Outcome Measure Help needed turning from your back to your side while in a flat bed without using bedrails?: A Little Help needed moving from lying on your back to sitting on the side of a flat bed without using bedrails?: A Little Help needed moving to and from a bed to a chair (including a  wheelchair)?: A Little Help needed standing up from a chair using your arms (e.g., wheelchair or bedside chair)?: A Little Help needed to walk in hospital room?: A Little Help needed climbing 3-5 steps with a railing? : A Lot 6 Click Score: 17    End of Session Equipment Utilized During Treatment: Gait belt Activity Tolerance: Patient tolerated treatment well Patient left: in chair;with chair alarm set;with call bell/phone within reach Nurse Communication: Mobility status PT Visit Diagnosis: Other abnormalities of gait and mobility (R26.89);History of falling (Z91.81);Muscle weakness (generalized) (M62.81);Pain;Difficulty in walking, not elsewhere classified (R26.2)    Time: 1100-1118 PT Time Calculation (min) (ACUTE ONLY): 18 min   Charges:     PT Treatments $Therapeutic Activity: 8-22 mins         [employer], SPT 11/05/2021, 12:17 PM

## 2021-11-05 NOTE — Progress Notes (Signed)
Attempted to call report to [facility] at [phone]. Receptionist answered twice, stated Nurse is unavailable. Left call back number to be able to provide report. Pt to Room 202.

## 2021-11-05 NOTE — Discharge Summary (Signed)
Physician Discharge Summary  Patient ID: [name] MRN: [id number] DOB/AGE: 1955/01/17 66 y.o.  Admit date: 11/02/2021 Discharge date: 11/05/2021  Admission Diagnoses:Principal Problem:   Cervical myelopathy ([place]) Active Problems:   Cervical spinal stenosis   Disease of spinal cord, unspecified ([place])   Spinal stenosis in cervical region  Discharge Diagnoses:  Principal Problem:   Cervical myelopathy ([place]) Active Problems:   Cervical spinal stenosis   Disease of spinal cord, unspecified ([place])   Spinal stenosis in cervical region   Discharged Condition: good  Hospital Course: [name] is a 66 yo female who presented with progressive cervical myelopathy.  She underwent surgery for this and was evaluated by PTOT for difficulty with ambulation.  Due to her instability, discharge to SNF/Rehab was initiated.  Consults: None  Significant Diagnostic Studies: radiology: X-Ray: localization at C3-4  Treatments: surgery: C3-4 ACDF  Discharge Exam: Blood pressure 109/66, pulse 66, temperature 97.7 F (36.5 C), resp. rate 18, height 5' 6.5" (1.689 m), weight 72.1 kg, SpO2 100 %. General appearance: alert and cooperative CNI MAEW with minor upper extremity weakness  Disposition: Discharge disposition: 03-Skilled Nursing Facility       Discharge Instructions     Discharge patient   Complete by: As directed    Discharge disposition: 03-Skilled [place]   Discharge patient date: 11/05/2021   Incentive spirometry RT   Complete by: As directed       Allergies as of 11/05/2021       Reactions   Iodine-131 Anaphylaxis   Neck tightens up   Iohexol Anaphylaxis   Levofloxacin Other (See Comments)   BASELINE PROLONGED QTc.     Zofran [ondansetron Hcl] Other (See Comments)   BASELINE PROLONGED QTc.   Heparin Itching, Other (See Comments)   Pt is able to tolerate IV heparin, but not sub-Q.   Morphine And Related Itching        Medication List     TAKE  these medications    albuterol 108 (90 Base) MCG/ACT inhaler Commonly known as: VENTOLIN HFA Inhale 1-2 puffs into the lungs every 6 (six) hours as needed for wheezing or shortness of breath.   amitriptyline 50 MG tablet Commonly known as: ELAVIL Take 50 mg by mouth at bedtime.   amLODipine 10 MG tablet Commonly known as: NORVASC Take 10 mg by mouth every morning.   Anoro Ellipta 62.5-25 MCG/ACT Aepb Generic drug: umeclidinium-vilanterol Inhale 1 puff into the lungs daily.   atorvastatin 40 MG tablet Commonly known as: LIPITOR Take 1 tablet (40 mg total) by mouth daily at 6 PM. What changed: when to take this   baclofen 10 MG tablet Commonly known as: LIORESAL Take 10 mg by mouth 3 (three) times daily as needed for muscle spasms.   Breo Ellipta 100-25 MCG/ACT Aepb Generic drug: fluticasone furoate-vilanterol Inhale 1 puff into the lungs daily as needed (for shortness of breath).   celecoxib 200 MG capsule Commonly known as: CELEBREX Take 1 capsule (200 mg total) by mouth every 12 (twelve) hours.   diclofenac Sodium 1 % Gel Commonly known as: VOLTAREN Apply 2 g topically 4 (four) times daily as needed (joint pain).   DULoxetine 30 MG capsule Commonly known as: CYMBALTA Take 30 mg by mouth daily.   fluticasone 50 MCG/ACT nasal spray Commonly known as: FLONASE Place 1 spray into both nostrils daily as needed for allergies.   gabapentin 400 MG capsule Commonly known as: NEURONTIN Take 400-800 mg by mouth in the morning, at noon, and  at bedtime. 400 mg am, 400 mg afternoon, 800 mg at bedtime   lidocaine 5 % Commonly known as: Lidoderm Place 1 patch onto the skin daily as needed. Remove & Discard patch within 12 hours or as directed by MD   linaclotide 145 MCG Caps capsule Commonly known as: LINZESS Take 145 mcg by mouth daily as needed.   methocarbamol 500 MG tablet Commonly known as: ROBAXIN Take 1 tablet (500 mg total) by mouth every 6 (six) hours as needed  for muscle spasms.   naloxone 4 MG/0.1ML Liqd nasal spray kit Commonly known as: NARCAN Place 1 spray into the nose once. As needed for overdose   oxyCODONE 5 MG immediate release tablet Commonly known as: Oxy IR/ROXICODONE Take 1 tablet (5 mg total) by mouth every 4 (four) hours as needed for severe pain ((score 4 to 6)).   oxyCODONE-acetaminophen 10-325 MG tablet Commonly known as: PERCOCET Take 1 tablet by mouth every 4 (four) hours as needed for up to 7 days.   pantoprazole 40 MG tablet Commonly known as: PROTONIX Take 40 mg by mouth daily.        Follow-up Information     Geronimo Boot, PA-C Follow up on 11/13/2021.   Specialty: Neurosurgery Why: 230 pm Contact information: [place] [address] [phone]                 Signed: Meade Maw 11/05/2021, 8:00 AM

## 2021-11-06 NOTE — Telephone Encounter (Signed)
Faxed a copy of pt's AVS, discharge summary, and OP report to [facility].

## 2021-11-11 NOTE — Progress Notes (Signed)
Please clarify the medical condition(s) necessitating the order for:  Drug screening test on 11/02/21  Pre-op testing for anesthetic, history of cocaine positivity this year.

## 2021-11-12 NOTE — Progress Notes (Signed)
   REFERRING PHYSICIAN:  Roselee Nova, [address] [place],  [place] [address]  DOS: 11/02/21    C3-4 ACDF   HISTORY OF PRESENT ILLNESS: [name] is 2 weeks status post ACDF C3-C4. She is on chronic percocet 10/25 and was to continue it as well as add oxycodone 5 for severe pain and celebrex.   She was discharged to a facility- she has been discharged today and her daughter is picking her up to take her home.   She feels much better than she did preop. She is moving her arms and able to do more with her hands. She still has some numbness in both hands. Her arm pain is gone! She has some posterior neck pain.    PHYSICAL EXAMINATION:  NEUROLOGICAL:  General: In no acute distress.   Awake, alert, oriented to person, place, and time.  Pupils equal round and reactive to light.  Facial tone is symmetric.    Strength: Side Biceps Triceps Deltoid Interossei Grip Wrist Ext. Wrist Flex.  R '5 5 5 5 5 5 5  '$ L '5 5 5 5 5 5 5   '$ Incision c/d/i  Imaging:  Nothing new to review.   Assessment / Plan: [name] is doing well s/p above surgery. Treatment options reviewed with patient and following plan made:   - We discussed activity escalation and I have advised the patient to lift up to 10 pounds until 6 weeks after surgery (until your follow up with Dr. Izora Ribas).   - Reviewed wound care.  - Continue current medications including chronic percocet 10 from pain clinic. In review of PMP, prescription for percocet 10 and oxycodone 5 was sent to her pharmacy by Dr. Izora Ribas on 11/05/21. She will pick this up on her way home. She is to get all future prescriptions from her pain clinic (see recommendations under media). Op note sent to them as well.  - Follow up as scheduled in 4 weeks and prn.   Advised to contact the office if any questions or concerns arise.   Geronimo Boot PA-C Dept of Neurosurgery

## 2021-11-13 ENCOUNTER — Ambulatory Visit (INDEPENDENT_AMBULATORY_CARE_PROVIDER_SITE_OTHER): Payer: Medicare Other | Admitting: Orthopedic Surgery

## 2021-11-13 ENCOUNTER — Encounter: Payer: Self-pay | Admitting: Orthopedic Surgery

## 2021-11-13 VITALS — BP 98/60 | HR 85 | Temp 98.4°F | Wt 175.2 lb

## 2021-11-13 DIAGNOSIS — M4802 Spinal stenosis, cervical region: Secondary | ICD-10-CM

## 2021-11-13 DIAGNOSIS — Z09 Encounter for follow-up examination after completed treatment for conditions other than malignant neoplasm: Secondary | ICD-10-CM

## 2021-11-13 DIAGNOSIS — Z981 Arthrodesis status: Secondary | ICD-10-CM

## 2021-11-13 DIAGNOSIS — G959 Disease of spinal cord, unspecified: Secondary | ICD-10-CM

## 2021-11-15 ENCOUNTER — Other Ambulatory Visit: Payer: Self-pay

## 2021-11-15 ENCOUNTER — Encounter (HOSPITAL_COMMUNITY): Payer: Self-pay

## 2021-11-15 ENCOUNTER — Emergency Department (HOSPITAL_COMMUNITY)
Admission: EM | Admit: 2021-11-15 | Discharge: 2021-11-15 | Disposition: A | Discharge status: Home / Self Care | Payer: Medicare Other | Attending: Emergency Medicine | Admitting: Emergency Medicine

## 2021-11-15 ENCOUNTER — Emergency Department (HOSPITAL_COMMUNITY): Payer: Medicare Other

## 2021-11-15 DIAGNOSIS — R070 Pain in throat: Secondary | ICD-10-CM | POA: Diagnosis present

## 2021-11-15 DIAGNOSIS — M542 Cervicalgia: Secondary | ICD-10-CM | POA: Diagnosis not present

## 2021-11-15 LAB — CBC WITH DIFFERENTIAL/PLATELET
Abs Immature Granulocytes: 0.01 10*3/uL (ref 0.00–0.07)
Basophils Absolute: 0 10*3/uL (ref 0.0–0.1)
Basophils Relative: 0 %
Eosinophils Absolute: 0.2 10*3/uL (ref 0.0–0.5)
Eosinophils Relative: 4 %
HCT: 36.2 % (ref 36.0–46.0)
Hemoglobin: 11.4 g/dL — ABNORMAL LOW (ref 12.0–15.0)
Immature Granulocytes: 0 %
Lymphocytes Relative: 21 %
Lymphs Abs: 1.3 10*3/uL (ref 0.7–4.0)
MCH: 29.2 pg (ref 26.0–34.0)
MCHC: 31.5 g/dL (ref 30.0–36.0)
MCV: 92.8 fL (ref 80.0–100.0)
Monocytes Absolute: 0.4 10*3/uL (ref 0.1–1.0)
Monocytes Relative: 7 %
Neutro Abs: 4 10*3/uL (ref 1.7–7.7)
Neutrophils Relative %: 68 %
Platelets: 394 10*3/uL (ref 150–400)
RBC: 3.9 MIL/uL (ref 3.87–5.11)
RDW: 15 % (ref 11.5–15.5)
WBC: 5.9 10*3/uL (ref 4.0–10.5)
nRBC: 0 % (ref 0.0–0.2)

## 2021-11-15 LAB — BASIC METABOLIC PANEL
Anion gap: 7 (ref 5–15)
BUN: 11 mg/dL (ref 8–23)
CO2: 25 mmol/L (ref 22–32)
Calcium: 8.9 mg/dL (ref 8.9–10.3)
Chloride: 103 mmol/L (ref 98–111)
Creatinine, Ser: 0.75 mg/dL (ref 0.44–1.00)
GFR, Estimated: >60 mL/min (ref >60)
Glucose, Bld: 104 mg/dL — ABNORMAL HIGH (ref 70–99)
Potassium: 4.7 mmol/L (ref 3.5–5.1)
Sodium: 135 mmol/L (ref 135–145)

## 2021-11-15 MED ORDER — METHYLPREDNISOLONE 4 MG PO TBPK: ORAL_TABLET | ORAL | 0 refills | Status: DC

## 2021-11-15 NOTE — ED Triage Notes (Signed)
Pt. BIB [facility] for throat pain. Pt. Recently had throat surgery. She reports difficulty swallowing and difficulty breathing. Pt. O2 at 93% RA with EMS.

## 2021-11-15 NOTE — Discharge Instructions (Addendum)
Follow up with your doctor in the office.  Take the steroids as prescribed.   You had signs that you might have swollen lymph nodes in your chest.  Please mention this to your family doc and see if they want to obtain further testing or imaging.

## 2021-11-15 NOTE — ED Provider Notes (Signed)
[facility] DEPT Provider Note   CSN: [id number] Arrival date & time: 11/15/21  0450     History  No chief complaint on file.   [name] is a 66 y.o. female.  66 yo F With a chief complaints of neck pain and difficulty breathing.  This she does noticed this morning when she woke up.  She is very recently status post C 3 4 fusion.  This was done in [place].  She has had significant improvements of her neck pain and arm symptoms after this.  Has been doing fine until she woke up this morning.  She does feel little bit congested and has coughed a little bit this morning as well.  No known sick contacts.  No decreased range of motion.  No difficulty swallowing.        Home Medications Prior to Admission medications   Medication Sig Start Date End Date Taking? Authorizing Provider  methylPREDNISolone (MEDROL DOSEPAK) 4 MG TBPK tablet Day 1: '8mg'$  before breakfast, 4 mg after lunch, 4 mg after supper, and 8 mg at bedtime Day 2: 4 mg before breakfast, 4 mg after lunch, 4 mg  after supper, and 8 mg  at bedtime Day 3:  4 mg  before breakfast, 4 mg  after lunch, 4 mg after supper, and 4 mg  at bedtime Day 4: 4 mg  before breakfast, 4 mg  after lunch, and 4 mg at bedtime Day 5: 4 mg  before breakfast and 4 mg at bedtime Day 6: 4 mg  before breakfast 11/15/21  Yes Deno Etienne, DO  albuterol (VENTOLIN HFA) 108 (90 Base) MCG/ACT inhaler Inhale 1-2 puffs into the lungs every 6 (six) hours as needed for wheezing or shortness of breath. 11/24/20   Kathie Dike, MD  amitriptyline (ELAVIL) 50 MG tablet Take 50 mg by mouth at bedtime. 09/17/21   [provider]  amLODipine (NORVASC) 10 MG tablet Take 10 mg by mouth every morning. 03/12/19   [provider]  Celedonio Miyamoto 62.5-25 MCG/ACT AEPB Inhale 1 puff into the lungs daily. 09/17/21   [provider]  atorvastatin (LIPITOR) 40 MG tablet Take 1 tablet (40 mg total) by mouth daily at 6  PM. Patient taking differently: Take 40 mg by mouth at bedtime. 08/22/14   Rai, Vernelle Emerald, MD  baclofen (LIORESAL) 10 MG tablet Take 10 mg by mouth 3 (three) times daily as needed for muscle spasms. 05/30/21   [provider]  celecoxib (CELEBREX) 200 MG capsule Take 1 capsule (200 mg total) by mouth every 12 (twelve) hours. 11/05/21   Meade Maw, MD  diclofenac Sodium (VOLTAREN) 1 % GEL Apply 2 g topically 4 (four) times daily as needed (joint pain). 07/18/21   Lacinda Axon, MD  DULoxetine (CYMBALTA) 30 MG capsule Take 30 mg by mouth daily. 03/05/21   [provider]  fluticasone (FLONASE) 50 MCG/ACT nasal spray Place 1 spray into both nostrils daily as needed for allergies. 07/13/21   [provider]  gabapentin (NEURONTIN) 400 MG capsule Take 400-800 mg by mouth in the morning, at noon, and at bedtime. 400 mg am, 400 mg afternoon, 800 mg at bedtime 03/26/21   [provider]  lidocaine (LIDODERM) 5 % Place 1 patch onto the skin daily as needed. Remove & Discard patch within 12 hours or as directed by MD 07/02/21   Jeanell Sparrow, DO  linaclotide (LINZESS) 145 MCG CAPS capsule Take 145 mcg by mouth daily as  needed.    [provider]  naloxone [facility]) nasal spray 4 mg/0.1 mL Place 1 spray into the nose once. As needed for overdose    [provider]  oxyCODONE-acetaminophen (PERCOCET) 10-325 MG tablet Take 1 tablet by mouth every 4 (four) hours as needed for pain.    [provider]  pantoprazole (PROTONIX) 40 MG tablet Take 40 mg by mouth daily.    [provider]      Allergies    Iodine-131, Iohexol, Levofloxacin, Zofran [ondansetron hcl], Heparin, and Morphine and related    Review of Systems   Review of Systems  Physical Exam Updated Vital Signs BP (!) 153/92   Pulse 79   SpO2 95%  Physical Exam Vitals and nursing note reviewed.  Constitutional:      General: She is not in acute distress.     Appearance: She is well-developed. She is not diaphoretic.  HENT:     Head: Normocephalic and atraumatic.     Comments: Swollen turbinates, posterior nasal drip, Left TM with effusion without erythema or bulging. Some lymph node swelling at the angle of the jaw as well as in the anterior chain on the left.   Eyes:     Pupils: Pupils are equal, round, and reactive to light.  Cardiovascular:     Rate and Rhythm: Normal rate and regular rhythm.     Heart sounds: No murmur heard.    No friction rub. No gallop.  Pulmonary:     Effort: Pulmonary effort is normal.     Breath sounds: No wheezing or rales.  Abdominal:     General: There is no distension.     Palpations: Abdomen is soft.     Tenderness: There is no abdominal tenderness.  Musculoskeletal:        General: No tenderness.     Cervical back: Normal range of motion and neck supple.  Skin:    General: Skin is warm and dry.  Neurological:     Mental Status: She is alert and oriented to person, place, and time.  Psychiatric:        Behavior: Behavior normal.     ED Results / Procedures / Treatments   Labs (all labs ordered are listed, but only abnormal results are displayed) Labs Reviewed  CBC WITH DIFFERENTIAL/PLATELET - Abnormal; Notable for the following components:      Result Value   Hemoglobin 11.4 (*)    All other components within normal limits  BASIC METABOLIC PANEL - Abnormal; Notable for the following components:   Glucose, Bld 104 (*)    All other components within normal limits    EKG None  Radiology CT Soft Tissue Neck Wo Contrast  Result Date: 11/15/2021 CLINICAL DATA:  Recent neck surgery with postop neck pain EXAM: CT NECK WITHOUT CONTRAST TECHNIQUE: Multidetector CT imaging of the neck was performed following the standard protocol without intravenous contrast. RADIATION DOSE REDUCTION: This exam was performed according to the departmental dose-optimization program which includes automated exposure  control, adjustment of the mA and/or kV according to patient size and/or use of iterative reconstruction technique. COMPARISON:  04/02/2021 FINDINGS: Pharynx and larynx: Soft tissue swelling at the posterior wall of the hypopharynx and pharynx. The airway is patent. No discrete hematoma. 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 Thyroid: Normal Lymph nodes: Unremarkable Vascular: Scattered atheromatous calcification Limited intracranial: Negative Visualized orbits: Negative  Mastoids and visualized paranasal sinuses: Clear Skeleton: C3-4 ACDF with located hardware. C5-C7 ACDF with solid arthrodesis. No acute or aggressive finding. Expected swelling along the operative pathway. Upper chest: Emphysema. IMPRESSION: 1. Posterior hypopharyngeal and laryngeal wall swelling in the region of surgery. The airway is patent and there is no discrete hematoma or collection. 2. Unremarkable C3-4 hardware. 3. Known emphysema and parotid nodules. Electronically Signed   By: Jorje Guild M.D.   On: 11/15/2021 06:22   DG Chest Port 1 View  Result Date: 11/15/2021 CLINICAL DATA:  Shortness of breath. EXAM: PORTABLE CHEST 1 VIEW COMPARISON:  07/06/2021 FINDINGS: 0531 hours. Stable mild asymmetric elevation right hemidiaphragm. Cardiopericardial silhouette is at upper limits of normal for size. Large hiatal hernia no focal airspace consolidation, pulmonary edema, or substantial pleural effusion. Fullness in the right hilum raises concern for lymphadenopathy. Bones are diffusely demineralized. IMPRESSION: 1. Fullness in the right hilum raises concern for lymphadenopathy. CT chest with contrast recommended to further evaluate. 2. Large hiatal hernia. Electronically Signed   By: Misty Stanley M.D.   On: 11/15/2021 06:05    Procedures Procedures    Medications Ordered in ED Medications - No data to display  ED  Course/ Medical Decision Making/ A&P                           Medical Decision Making Amount and/or Complexity of Data Reviewed Labs: ordered. Radiology: ordered.  Risk Prescription drug management.   66 yo F With a significant past medical history of a C3-4 anterior cervical disc removal and fusion.  This was performed about 10 days ago.  She had been doing fine and then woke up this morning with difficulty breathing and left-sided neck pain which is the size of her surgical incision.  Clinically the patient has URI-like symptoms.  With her being so close to postop we will obtain CT imaging to assess for possible complication.  She does have a documented contrast dye allergy and on my record review has not had any CT imaging performed with contrast in our system.  We will obtain a CT scan without.  Basic blood work.  Reassess.  CT with some swelling near the surgery without hematoma or fluid collection.  Discussed with Smitty Pluck on call for neurosurgery.  Felt if looked well, no difficulty breathing or swallowing ok for medrol dose pack.  Follow up with her neurosurgeon.   6:55 AM:  I have discussed the diagnosis/risks/treatment options with the patient.  Evaluation and diagnostic testing in the emergency department does not suggest an emergent condition requiring admission or immediate intervention beyond what has been performed at this time.  They will follow up with Neurosurgery. We also discussed returning to the ED immediately if new or worsening sx occur. We discussed the sx which are most concerning (e.g., sudden worsening pain, fever, inability to tolerate by mouth) that necessitate immediate return. Medications administered to the patient during their visit and any new prescriptions provided to the patient are listed below.  Medications given during this visit Medications - No data to display   The patient appears reasonably screen and/or stabilized for discharge and I doubt any  other medical condition or other [facility] requiring further screening, evaluation, or treatment in the ED at this time prior to discharge.         Final Clinical Impression(s) / ED Diagnoses Final diagnoses:  Throat pain in adult    Rx / DC  Orders ED Discharge Orders          Ordered    methylPREDNISolone (MEDROL DOSEPAK) 4 MG TBPK tablet        11/15/21 0652              Deno Etienne, DO 11/15/21 [phone]

## 2021-11-17 ENCOUNTER — Other Ambulatory Visit: Payer: Self-pay

## 2021-11-17 ENCOUNTER — Encounter (HOSPITAL_COMMUNITY): Payer: Self-pay | Admitting: Emergency Medicine

## 2021-11-17 ENCOUNTER — Emergency Department (HOSPITAL_COMMUNITY): Payer: Medicare Other

## 2021-11-17 ENCOUNTER — Emergency Department (HOSPITAL_COMMUNITY)
Admission: EM | Admit: 2021-11-17 | Discharge: 2021-11-18 | Disposition: A | Discharge status: Home / Self Care | Payer: Medicare Other | Attending: Emergency Medicine | Admitting: Emergency Medicine

## 2021-11-17 DIAGNOSIS — M542 Cervicalgia: Secondary | ICD-10-CM | POA: Diagnosis present

## 2021-11-17 DIAGNOSIS — I129 Hypertensive chronic kidney disease with stage 1 through stage 4 chronic kidney disease, or unspecified chronic kidney disease: Secondary | ICD-10-CM | POA: Insufficient documentation

## 2021-11-17 DIAGNOSIS — Z7951 Long term (current) use of inhaled steroids: Secondary | ICD-10-CM | POA: Insufficient documentation

## 2021-11-17 DIAGNOSIS — J449 Chronic obstructive pulmonary disease, unspecified: Secondary | ICD-10-CM | POA: Diagnosis not present

## 2021-11-17 DIAGNOSIS — R0789 Other chest pain: Secondary | ICD-10-CM | POA: Insufficient documentation

## 2021-11-17 DIAGNOSIS — H61199 Noninfective disorders of pinna, unspecified ear: Secondary | ICD-10-CM | POA: Diagnosis not present

## 2021-11-17 DIAGNOSIS — R519 Headache, unspecified: Secondary | ICD-10-CM | POA: Insufficient documentation

## 2021-11-17 DIAGNOSIS — Z79899 Other long term (current) drug therapy: Secondary | ICD-10-CM | POA: Diagnosis not present

## 2021-11-17 DIAGNOSIS — W19XXXA Unspecified fall, initial encounter: Secondary | ICD-10-CM

## 2021-11-17 DIAGNOSIS — N189 Chronic kidney disease, unspecified: Secondary | ICD-10-CM | POA: Insufficient documentation

## 2021-11-17 LAB — COMPREHENSIVE METABOLIC PANEL
ALT: 9 U/L (ref 0–44)
AST: 15 U/L (ref 15–41)
Albumin: 3.5 g/dL (ref 3.5–5.0)
Alkaline Phosphatase: 98 U/L (ref 38–126)
Anion gap: 7 (ref 5–15)
BUN: 22 mg/dL (ref 8–23)
CO2: 22 mmol/L (ref 22–32)
Calcium: 8.3 mg/dL — ABNORMAL LOW (ref 8.9–10.3)
Chloride: 107 mmol/L (ref 98–111)
Creatinine, Ser: 1.29 mg/dL — ABNORMAL HIGH (ref 0.44–1.00)
GFR, Estimated: 45 mL/min — ABNORMAL LOW (ref >60)
Glucose, Bld: 92 mg/dL (ref 70–99)
Potassium: 4.1 mmol/L (ref 3.5–5.1)
Sodium: 136 mmol/L (ref 135–145)
Total Bilirubin: 0.5 mg/dL (ref 0.3–1.2)
Total Protein: 7.3 g/dL (ref 6.5–8.1)

## 2021-11-17 LAB — CBC WITH DIFFERENTIAL/PLATELET
Abs Immature Granulocytes: 0.03 10*3/uL (ref 0.00–0.07)
Basophils Absolute: 0 10*3/uL (ref 0.0–0.1)
Basophils Relative: 0 %
Eosinophils Absolute: 0.5 10*3/uL (ref 0.0–0.5)
Eosinophils Relative: 5 %
HCT: 37.3 % (ref 36.0–46.0)
Hemoglobin: 11.6 g/dL — ABNORMAL LOW (ref 12.0–15.0)
Immature Granulocytes: 0 %
Lymphocytes Relative: 25 %
Lymphs Abs: 2.3 10*3/uL (ref 0.7–4.0)
MCH: 29.5 pg (ref 26.0–34.0)
MCHC: 31.1 g/dL (ref 30.0–36.0)
MCV: 94.9 fL (ref 80.0–100.0)
Monocytes Absolute: 0.6 10*3/uL (ref 0.1–1.0)
Monocytes Relative: 7 %
Neutro Abs: 5.9 10*3/uL (ref 1.7–7.7)
Neutrophils Relative %: 63 %
Platelets: 353 10*3/uL (ref 150–400)
RBC: 3.93 MIL/uL (ref 3.87–5.11)
RDW: 14.8 % (ref 11.5–15.5)
WBC: 9.3 10*3/uL (ref 4.0–10.5)
nRBC: 0 % (ref 0.0–0.2)

## 2021-11-17 LAB — TROPONIN I (HIGH SENSITIVITY)
Troponin I (High Sensitivity): 7 ng/L (ref <18)
Troponin I (High Sensitivity): 7 ng/L (ref <18)

## 2021-11-17 LAB — MAGNESIUM: Magnesium: 2.2 mg/dL (ref 1.7–2.4)

## 2021-11-17 MED ORDER — LORAZEPAM 1 MG PO TABS
1.0000 mg | ORAL_TABLET | Freq: Once | ORAL | Status: AC
  Administered 2021-11-17: 1 mg via ORAL
  Filled 2021-11-17: qty 1

## 2021-11-17 MED ORDER — LACTATED RINGERS IV BOLUS
500.0000 mL | Freq: Once | INTRAVENOUS | Status: AC
  Administered 2021-11-17: 500 mL via INTRAVENOUS

## 2021-11-17 MED ORDER — FENTANYL CITRATE PF 50 MCG/ML IJ SOSY
25.0000 ug | PREFILLED_SYRINGE | INTRAMUSCULAR | Status: DC | PRN
  Administered 2021-11-17: 25 ug via INTRAVENOUS
  Filled 2021-11-17: qty 1

## 2021-11-17 MED ORDER — OXYCODONE-ACETAMINOPHEN 5-325 MG PO TABS
1.0000 | ORAL_TABLET | Freq: Once | ORAL | Status: AC
  Administered 2021-11-17: 1 via ORAL
  Filled 2021-11-17: qty 1

## 2021-11-17 NOTE — ED Provider Notes (Signed)
[facility] DEPT Provider Note   CSN: [id number] Arrival date & time: 11/17/21  1138     History  Chief Complaint  Patient presents with   Fall   Neck Pain    [name] is a 66 y.o. female.   Fall Associated symptoms include chest pain.  Neck Pain Associated symptoms: chest pain   Patient presents for falls, neck pain and difficulty swallowing.  Medical history includes rheumatoid arthritis, substance abuse, CKD, COPD, GERD, HTN, spinal stenosis, cervical myelopathy, s/p C3-4 anterior cervical discectomy and fusion 2 weeks ago.  This was done at [facility].  She had a follow-up appointment in the office 4 days ago.  She was advised to continue her chronic Percocet 10/325 and 5 mg oxycodone was added for severe pain.  2 days later she developed difficulty breathing and, she was seen in the ED.  She was prescribed Medrol Dosepak.  She states that she returned home from rehab facility 2 days ago.  She currently lives alone.  Yesterday she developed difficulty swallowing.  She feels that food and medications get stuck in her throat when she attempts to swallow.  Earlier today, she had 2 falls.  1 of these occurred in the bathroom and one occurred from falling off the bed.  Both times, she struck the right side of her head and face.  She has since had pain on the right side of her face, worsened neck pain, and diffuse chest pain.  Today, she had a home nurse stopped by.  This was the first visit of the home nurse.  Home nurse called the ambulance.  Patient reports that her breathing has improved over the past 2 days.     Home Medications Prior to Admission medications   Medication Sig Start Date End Date Taking? Authorizing Provider  albuterol (VENTOLIN HFA) 108 (90 Base) MCG/ACT inhaler Inhale 1-2 puffs into the lungs every 6 (six) hours as needed for wheezing or shortness of breath. 11/24/20   Kathie Dike, MD  amitriptyline (ELAVIL) 50 MG tablet Take  50 mg by mouth at bedtime. 09/17/21   [provider]  amLODipine (NORVASC) 10 MG tablet Take 10 mg by mouth every morning. 03/12/19   [provider]  Celedonio Miyamoto 62.5-25 MCG/ACT AEPB Inhale 1 puff into the lungs daily. 09/17/21   [provider]  atorvastatin (LIPITOR) 40 MG tablet Take 1 tablet (40 mg total) by mouth daily at 6 PM. Patient taking differently: Take 40 mg by mouth at bedtime. 08/22/14   Rai, Vernelle Emerald, MD  baclofen (LIORESAL) 10 MG tablet Take 10 mg by mouth 3 (three) times daily as needed for muscle spasms. 05/30/21   [provider]  celecoxib (CELEBREX) 200 MG capsule Take 1 capsule (200 mg total) by mouth every 12 (twelve) hours. 11/05/21   Meade Maw, MD  diclofenac Sodium (VOLTAREN) 1 % GEL Apply 2 g topically 4 (four) times daily as needed (joint pain). 07/18/21   Lacinda Axon, MD  DULoxetine (CYMBALTA) 30 MG capsule Take 30 mg by mouth daily. 03/05/21   [provider]  fluticasone (FLONASE) 50 MCG/ACT nasal spray Place 1 spray into both nostrils daily as needed for allergies. 07/13/21   [provider]  gabapentin (NEURONTIN) 400 MG capsule Take 400-800 mg by mouth in the morning, at noon, and at bedtime. 400 mg am, 400 mg afternoon, 800 mg at bedtime 03/26/21   [provider]  lidocaine (LIDODERM) 5 % Place 1 patch  onto the skin daily as needed. Remove & Discard patch within 12 hours or as directed by MD 07/02/21   Jeanell Sparrow, DO  linaclotide (LINZESS) 145 MCG CAPS capsule Take 145 mcg by mouth daily as needed.    [provider]  methylPREDNISolone (MEDROL DOSEPAK) 4 MG TBPK tablet Day 1: '8mg'$  before breakfast, 4 mg after lunch, 4 mg after supper, and 8 mg at bedtime Day 2: 4 mg before breakfast, 4 mg after lunch, 4 mg  after supper, and 8 mg  at bedtime Day 3:  4 mg  before breakfast, 4 mg  after lunch, 4 mg after supper, and 4 mg  at bedtime Day 4: 4 mg  before breakfast, 4 mg  after lunch, and  4 mg at bedtime Day 5: 4 mg  before breakfast and 4 mg at bedtime Day 6: 4 mg  before breakfast 11/15/21   Deno Etienne, DO  naloxone [facility]) nasal spray 4 mg/0.1 mL Place 1 spray into the nose once. As needed for overdose    [provider]  oxyCODONE-acetaminophen (PERCOCET) 10-325 MG tablet Take 1 tablet by mouth every 4 (four) hours as needed for pain.    [provider]  pantoprazole (PROTONIX) 40 MG tablet Take 40 mg by mouth daily.    [provider]      Allergies    Iodine-131, Iohexol, Levofloxacin, Zofran [ondansetron hcl], Heparin, and Morphine and related    Review of Systems   Review of Systems  HENT:  Positive for ear pain (External ear), sore throat, trouble swallowing and voice change.   Respiratory:  Positive for choking.   Cardiovascular:  Positive for chest pain.  Musculoskeletal:  Positive for neck pain.  All other systems reviewed and are negative.   Physical Exam Updated Vital Signs BP 131/70   Pulse 83   Temp 98.4 F (36.9 C)   Resp 15   SpO2 100%  Physical Exam Vitals and nursing note reviewed.  Constitutional:      General: She is not in acute distress.    Appearance: She is well-developed. She is ill-appearing. She is not toxic-appearing or diaphoretic.  HENT:     Head: Normocephalic and atraumatic.     Right Ear: External ear normal.     Left Ear: External ear normal.     Ears:     Comments: Tenderness of right pinna, no significant swelling    Nose: Nose normal.     Mouth/Throat:     Mouth: Mucous membranes are moist.     Pharynx: Oropharynx is clear. No oropharyngeal exudate or posterior oropharyngeal erythema.  Eyes:     General: No scleral icterus.    Extraocular Movements: Extraocular movements intact.     Conjunctiva/sclera: Conjunctivae normal.  Cardiovascular:     Rate and Rhythm: Normal rate and regular rhythm.     Heart sounds: No murmur heard. Pulmonary:     Effort: Pulmonary effort is normal. No  respiratory distress.     Breath sounds: Normal breath sounds. No wheezing or rales.  Chest:     Chest wall: Tenderness present.  Abdominal:     General: There is no distension.     Palpations: Abdomen is soft.     Tenderness: There is no abdominal tenderness.  Musculoskeletal:        General: No swelling. Normal range of motion.     Cervical back: Neck supple. Tenderness (Anterior neck) present.     Right lower leg: No  edema.     Left lower leg: No edema.  Skin:    General: Skin is warm and dry.     Coloration: Skin is not jaundiced or pale.  Neurological:     General: No focal deficit present.     Mental Status: She is alert and oriented to person, place, and time.     Cranial Nerves: No cranial nerve deficit.     Sensory: No sensory deficit.     Motor: No weakness.     Coordination: Coordination normal.  Psychiatric:        Mood and Affect: Mood normal.        Behavior: Behavior normal.        Thought Content: Thought content normal.        Judgment: Judgment normal.     ED Results / Procedures / Treatments   Labs (all labs ordered are listed, but only abnormal results are displayed) Labs Reviewed  COMPREHENSIVE METABOLIC PANEL - Abnormal; Notable for the following components:      Result Value   Creatinine, Ser 1.29 (*)    Calcium 8.3 (*)    GFR, Estimated 45 (*)    All other components within normal limits  CBC WITH DIFFERENTIAL/PLATELET - Abnormal; Notable for the following components:   Hemoglobin 11.6 (*)    All other components within normal limits  MAGNESIUM  TROPONIN I (HIGH SENSITIVITY)  TROPONIN I (HIGH SENSITIVITY)    EKG EKG Interpretation  Date/Time:  Tuesday November 17 2021 19:49:49 EST Ventricular Rate:  81 PR Interval:  133 QRS Duration: 92 QT Interval:  386 QTC Calculation: 448 R Axis:   27 Text Interpretation: Sinus rhythm Anteroseptal infarct, old Nonspecific T abnormalities, lateral leads Confirmed by Godfrey Pick [phone]) on 11/17/2021  8:04:51 PM  Radiology CT Soft Tissue Neck Wo Contrast  Result Date: 11/17/2021 CLINICAL DATA:  ACDF C3-4 11/02/2021. Recent falls. Head and neck pain EXAM: CT NECK WITHOUT CONTRAST TECHNIQUE: Multidetector CT imaging of the neck was performed following the standard protocol without intravenous contrast. RADIATION DOSE REDUCTION: This exam was performed according to the departmental dose-optimization program which includes automated exposure control, adjustment of the mA and/or kV according to patient size and/or use of iterative reconstruction technique. COMPARISON:  CT neck without contrast 11/15/2021 FINDINGS: Pharynx and larynx: Normal. No mass or swelling. Salivary glands: Bilateral parotid lesions stable. The largest lesion left parotid tail is 21 mm in diameter. No calcifications or edema in the parotid glands. Submandibular gland normal bilaterally Thyroid: Negative Lymph nodes: No enlarged lymph nodes in the neck. Vascular: Limited vascular evaluation without intravenous contrast. Limited intracranial: Negative Visualized orbits: Negative Mastoids and visualized paranasal sinuses: Paranasal sinuses clear. Mastoid clear. Skeleton: Chronic fracture left orbital floor unchanged. Possible chronic fracture right orbital floor also unchanged. Recent ACDF at C3-4. Hardware and bone graft in good position Chronic ACDF with solid fusion C5-6 and C6-7. Upper chest: Apical emphysema, moderate.  No acute abnormality. Other: Mild soft tissue edema in the left anterior neck and chin likely due to recent ACDF. This is similar to the prior study. No fluid collection. IMPRESSION: 1. Recent ACDF C3-4. Hardware and bone graft in good position. 2. No acute abnormality in the neck. 3. Bilateral parotid lesions stable.  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 Electronically Signed   By: Franchot Gallo M.D.   On: 11/17/2021 19:08   CT Head Wo Contrast  Result Date: 11/17/2021 CLINICAL DATA:  [place]. Headache. EXAM: CT HEAD WITHOUT  CONTRAST CT CERVICAL SPINE  WITHOUT CONTRAST TECHNIQUE: Multidetector CT imaging of the head and cervical spine was performed following the standard protocol without intravenous contrast. Multiplanar CT image reconstructions of the cervical spine were also generated. RADIATION DOSE REDUCTION: This exam was performed according to the departmental dose-optimization program which includes automated exposure control, adjustment of the mA and/or kV according to patient size and/or use of iterative reconstruction technique. COMPARISON:  Low 07/17/2021 FINDINGS: CT HEAD FINDINGS Brain: The ventricles are normal in size and configuration. No extra-axial fluid collections are identified. The gray-white differentiation is maintained. No CT findings for acute hemispheric infarction or intracranial hemorrhage. No mass lesions. The brainstem and cerebellum are normal. Vascular: No hyperdense vessels or obvious aneurysm. Skull: No acute skull fracture. No bone lesion. Sinuses/Orbits: The paranasal sinuses and mastoid air cells are clear. The globes are intact. Other: No scalp lesions, laceration or hematoma. CT CERVICAL SPINE FINDINGS Alignment: Normal Skull base and vertebrae: No acute cervical spine fracture is identified. Extensive surgical changes with stable anterior and interbody fusion hardware at C5-6 and C6-7 and new anterior and interbody fusion hardware at C3-4. No solid interbody fusion changes yet. Soft tissues and spinal canal: No prevertebral fluid or swelling. No visible canal hematoma. Disc levels: Multilevel postoperative fusion changes as above. Advanced degenerative disc disease at C2-3 and C4-5 but no large disc protrusions, spinal or foraminal stenosis. Upper chest: The lung apices are grossly clear. Other: Carotid artery calcifications. Mild thyromegaly. IMPRESSION: 1. No acute intracranial findings or skull fracture. 2. Postoperative changes involving the cervical spine but no acute cervical spine fracture.  Electronically Signed   By: Marijo Sanes M.D.   On: 11/17/2021 19:02   CT Cervical Spine Wo Contrast  Result Date: 11/17/2021 CLINICAL DATA:  [place]. Headache. EXAM: CT HEAD WITHOUT CONTRAST CT CERVICAL SPINE WITHOUT CONTRAST TECHNIQUE: Multidetector CT imaging of the head and cervical spine was performed following the standard protocol without intravenous contrast. Multiplanar CT image reconstructions of the cervical spine were also generated. RADIATION DOSE REDUCTION: This exam was performed according to the departmental dose-optimization program which includes automated exposure control, adjustment of the mA and/or kV according to patient size and/or use of iterative reconstruction technique. COMPARISON:  Low 07/17/2021 FINDINGS: CT HEAD FINDINGS Brain: The ventricles are normal in size and configuration. No extra-axial fluid collections are identified. The gray-white differentiation is maintained. No CT findings for acute hemispheric infarction or intracranial hemorrhage. No mass lesions. The brainstem and cerebellum are normal. Vascular: No hyperdense vessels or obvious aneurysm. Skull: No acute skull fracture. No bone lesion. Sinuses/Orbits: The paranasal sinuses and mastoid air cells are clear. The globes are intact. Other: No scalp lesions, laceration or hematoma. CT CERVICAL SPINE FINDINGS Alignment: Normal Skull base and vertebrae: No acute cervical spine fracture is identified. Extensive surgical changes with stable anterior and interbody fusion hardware at C5-6 and C6-7 and new anterior and interbody fusion hardware at C3-4. No solid interbody fusion changes yet. Soft tissues and spinal canal: No prevertebral fluid or swelling. No visible canal hematoma. Disc levels: Multilevel postoperative fusion changes as above. Advanced degenerative disc disease at C2-3 and C4-5 but no large disc protrusions, spinal or foraminal stenosis. Upper chest: The lung apices are grossly clear. Other: Carotid artery  calcifications. Mild thyromegaly. IMPRESSION: 1. No acute intracranial findings or skull fracture. 2. Postoperative changes involving the cervical spine but no acute cervical spine fracture. Electronically Signed   By: Marijo Sanes M.D.   On: 11/17/2021 19:02   CT Chest Wo Contrast  Result Date: 11/17/2021 CLINICAL DATA:  Falls.  Head and neck pain.  Blunt chest trauma. EXAM: CT CHEST WITHOUT CONTRAST TECHNIQUE: Multidetector CT imaging of the chest was performed following the standard protocol without IV contrast. RADIATION DOSE REDUCTION: This exam was performed according to the departmental dose-optimization program which includes automated exposure control, adjustment of the mA and/or kV according to patient size and/or use of iterative reconstruction technique. COMPARISON:  Multiple exams, including 01/09/2021 and 11/15/2020 FINDINGS: Cardiovascular: Coronary, aortic arch, and branch vessel atherosclerotic vascular disease. Mild mitral valve calcification. Mediastinum/Nodes: Large hiatal hernia. Right lower paratracheal lymph node borderline prominent at 1.0 cm in diameter on image 46 series 3, previously 1.1 cm. Lungs/Pleura: Centrilobular emphysema. Passive atelectasis along the margins of the hiatal hernia. Mild atelectasis or scarring in the right middle lobe laterally. Upper Abdomen: Unremarkable Musculoskeletal: Lower cervical plate and screw fixator. Old healed left anterior rib fractures. Old healed left posterior rib fractures. Mild thoracic kyphosis. Mild levoconvex lower thoracic scoliosis. IMPRESSION: 1. No acute posttraumatic findings. 2. Large hiatal hernia. 3. Coronary, aortic arch, and branch vessel atherosclerotic vascular disease. Mild mitral valve calcification. 4. Old healed left rib fractures. Mild levoconvex lower thoracic scoliosis. 5. Aortic atherosclerosis. 6. Emphysema. Aortic Atherosclerosis (ICD10-I70.0) and Emphysema (ICD10-J43.9). Electronically Signed   By: Van Clines  M.D.   On: 11/17/2021 18:58    Procedures Procedures    Medications Ordered in ED Medications  fentaNYL (SUBLIMAZE) injection 25 mcg (25 mcg Intravenous Given 11/17/21 1946)  oxyCODONE-acetaminophen (PERCOCET/ROXICET) 5-325 MG per tablet 1 tablet (1 tablet Oral Given 11/17/21 1258)  LORazepam (ATIVAN) tablet 1 mg (1 mg Oral Given 11/17/21 1258)  lactated ringers bolus 500 mL (0 mLs Intravenous Stopped 11/17/21 2020)  lactated ringers bolus 500 mL (0 mLs Intravenous Stopped 11/17/21 2233)    ED Course/ Medical Decision Making/ A&P                           Medical Decision Making Amount and/or Complexity of Data Reviewed Labs: ordered. Radiology: ordered.  Risk Prescription drug management.   This patient presents to the ED for concern of fall, this involves an extensive number of treatment options, and is a complaint that carries with it a high risk of complications and morbidity.  The differential diagnosis includes acute injuries, postoperative pain, postoperative complications   Co morbidities that complicate the patient evaluation  rheumatoid arthritis, substance abuse, CKD, COPD, GERD, HTN, spinal stenosis, cervical myelopathy, s/p C3-4 anterior cervical discectomy and fusion 2 weeks ago   Additional history obtained:  Additional history obtained from N/A External records from outside source obtained and reviewed including EMR   Lab Tests:  I Ordered, and personally interpreted labs.  The pertinent results include: Baseline anemia, no leukocytosis, normal electrolytes, mild increase in creatinine from baseline, normal troponin   Imaging Studies ordered:  I ordered imaging studies including CT imaging of head, C-spine, neck, chest I independently visualized and interpreted imaging which showed expected postoperative changes without further acute findings I agree with the radiologist interpretation   Cardiac Monitoring: / EKG:  The patient was maintained on a  cardiac monitor.  I personally viewed and interpreted the cardiac monitored which showed an underlying rhythm of: Sinus rhythm   Consultations Obtained:  I requested consultation with the [place] neurosurgeon, Dr. Izora Ribas,  and discussed lab and imaging findings as well as pertinent plan - they recommend: Outpatient follow-up   Problem List / ED Course /  Critical interventions / Medication management  Patient is a 65 year old female presenting for multiple complaints.  She underwent ACDF 2 weeks ago.  She has since had ongoing pain in the area of her anterior neck.  She developed shortness of breath 2 days ago.  She reports this is improved today.  She recently developed difficulty swallowing.  She had 2 falls at home earlier today and now endorses right-sided facial and head pain in addition to diffuse anterior chest pain.  Patient was given fentanyl for analgesia.  IV fluids ordered for hydration given her decreased p.o. intake.  She underwent laboratory work-up and imaging of areas of concern.  CT imaging of head, neck, C-spine, and chest did not show any acute findings.  Laboratory work-up was notable for a mild increase in creatinine.  Patient was given additional IV fluids.  Patient had improved pain while in the ED.  She was able to eat and drink without difficulty.  I spoke with her neurosurgeon to inform him of her presence in the ED.  Based on work-up, patient's neurosurgeon does feel comfortable with outpatient follow-up.  Patient, at this time, is comfortable with this as well.  She was discharged in stable condition. I ordered medication including fentanyl for analgesia, IV fluids for hydration Reevaluation of the patient after these medicines showed that the patient improved I have reviewed the patients home medicines and have made adjustments as needed   Social Determinants of Health:  Lives alone with home health nurse support         Final Clinical Impression(s) / ED  Diagnoses Final diagnoses:  Fall, initial encounter    Rx / DC Orders ED Discharge Orders     None         Godfrey Pick, MD 11/17/21 2233

## 2021-11-17 NOTE — ED Triage Notes (Signed)
BIBA Per EMS: Pt had 2 falls & reports head & neck pain since 7am  VSS

## 2021-11-17 NOTE — Discharge Instructions (Addendum)
Follow-up with your neurosurgeon for reassessment.  Continue home pain medications as needed.  Return to the emergency department for any new or worsening symptoms of concern.

## 2021-11-17 NOTE — ED Notes (Signed)
IV attempted by 2 people and unable to obtain.  Did get blood work.

## 2021-11-18 DIAGNOSIS — M542 Cervicalgia: Secondary | ICD-10-CM | POA: Diagnosis not present

## 2021-11-18 MED ORDER — KETOROLAC TROMETHAMINE 15 MG/ML IJ SOLN
15.0000 mg | Freq: Once | INTRAMUSCULAR | Status: AC
  Administered 2021-11-18: 15 mg via INTRAVENOUS
  Filled 2021-11-18: qty 1

## 2021-11-18 MED ORDER — OXYCODONE-ACETAMINOPHEN 5-325 MG PO TABS
1.0000 | ORAL_TABLET | Freq: Once | ORAL | Status: AC
  Administered 2021-11-18: 1 via ORAL
  Filled 2021-11-18: qty 1

## 2021-11-18 MED ORDER — KETOROLAC TROMETHAMINE 60 MG/2ML IM SOLN: 30.0000 mg | Freq: Once | INTRAMUSCULAR | Status: DC

## 2021-11-18 MED ORDER — IBUPROFEN 200 MG PO TABS: 600.0000 mg | ORAL_TABLET | Freq: Once | ORAL | Status: DC

## 2021-11-23 ENCOUNTER — Telehealth: Payer: Self-pay

## 2021-11-23 NOTE — Telephone Encounter (Signed)
Pt left voicemail that she doesn't feel well and is "burning up".

## 2021-11-23 NOTE — Telephone Encounter (Signed)
I contacted the pt's sister (the main # in her chart). She states she was the one who took her sister to the ER the other day and she hasn't been feeling well, but she didn't know what symptoms she was having at the moment. I told her I would contact the patient directly.

## 2021-11-23 NOTE — Telephone Encounter (Signed)
I attempted to contact [name], but she did not answer. I left a message on her voicemail that she could call us back or she could call [phone] and request to page the neurosurgeon on call or proceed to an urgent care or the ER if her concerns were something urgent.

## 2021-12-14 ENCOUNTER — Other Ambulatory Visit: Payer: Self-pay

## 2021-12-14 DIAGNOSIS — Z981 Arthrodesis status: Secondary | ICD-10-CM

## 2021-12-15 ENCOUNTER — Ambulatory Visit
Admission: RE | Admit: 2021-12-15 | Discharge: 2021-12-15 | Disposition: A | Discharge status: Home / Self Care | Payer: Medicare Other | Attending: Neurosurgery | Admitting: Neurosurgery

## 2021-12-15 ENCOUNTER — Encounter: Payer: Self-pay | Admitting: Neurosurgery

## 2021-12-15 ENCOUNTER — Ambulatory Visit (INDEPENDENT_AMBULATORY_CARE_PROVIDER_SITE_OTHER): Payer: Medicare Other | Admitting: Neurosurgery

## 2021-12-15 ENCOUNTER — Ambulatory Visit
Admission: RE | Admit: 2021-12-15 | Discharge: 2021-12-15 | Disposition: A | Discharge status: Home / Self Care | Payer: Medicare Other | Source: Ambulatory Visit | Attending: Neurosurgery | Admitting: Neurosurgery

## 2021-12-15 VITALS — BP 126/76 | Ht 66.5 in | Wt 175.0 lb

## 2021-12-15 DIAGNOSIS — Z981 Arthrodesis status: Secondary | ICD-10-CM

## 2021-12-15 DIAGNOSIS — G959 Disease of spinal cord, unspecified: Secondary | ICD-10-CM

## 2021-12-15 DIAGNOSIS — Z09 Encounter for follow-up examination after completed treatment for conditions other than malignant neoplasm: Secondary | ICD-10-CM

## 2021-12-15 DIAGNOSIS — M4802 Spinal stenosis, cervical region: Secondary | ICD-10-CM

## 2021-12-15 NOTE — Progress Notes (Signed)
   REFERRING PHYSICIAN:  Roselee Nova, [address] [place],  [place] [id number]  DOS: 11/02/21    C3-4 ACDF   HISTORY OF PRESENT ILLNESS: [name] is status post ACDF C3-C4.   She is doing much better from a neurological basis.  She continues to have neck pain as well as some swallowing difficulty, though it is improving.  PHYSICAL EXAMINATION:  NEUROLOGICAL:  General: In no acute distress.   Awake, alert, oriented to person, place, and time.  Pupils equal round and reactive to light.  Facial tone is symmetric.    Strength: Side Biceps Triceps Deltoid Interossei Grip Wrist Ext. Wrist Flex.  R 5 5 4+ '5 5 5 5  '$ L 5 5 4+ '5 5 5 5   '$ Incision c/d/i  Imaging:  No complications noted  Assessment / Plan: [name] is doing well s/p above surgery.  She continues to do physical therapy.  I have given her exercises for her neck.  I think she will continue to have improvement in her neck pain and her swallowing.  I will see her back in 6 weeks with x-rays.  I am very encouraged by her progress.  Meade Maw, MD  Dept of Neurosurgery

## 2021-12-23 IMAGING — CT CT MAXILLOFACIAL W/O CM
3 series · 16 of 47 positions shown, 19 images · non-contrast
Comparison: Prior CT scan of the head 05/29/2015

CLINICAL DATA: 64-year-old female with facial trauma after falling
and hitting her head on the bathroom wall

EXAM:
CT HEAD WITHOUT CONTRAST
CT MAXILLOFACIAL WITHOUT CONTRAST
TECHNIQUE: Multidetector CT imaging of the head and maxillofacial structures
were performed using the standard protocol without intravenous
contrast. Multiplanar CT image reconstructions of the maxillofacial
structures were also generated.

[Series 3: facial/ orbits 2.0 h30s · axial · 0.37mm/px · z∈[+968,+1102]mm · 10 of 79 slices shown, 13 images]
[im 6/79  brain]
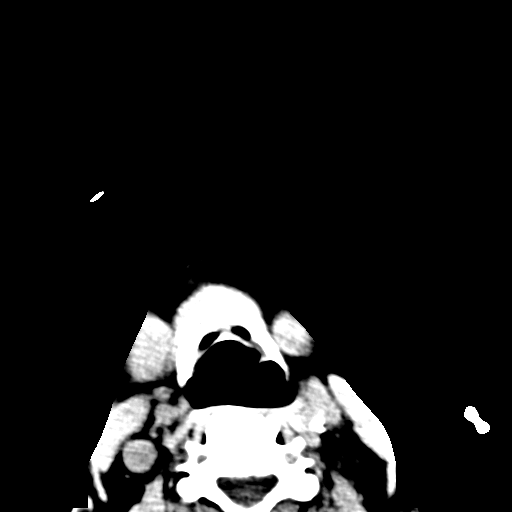
[im 6/79  bone]
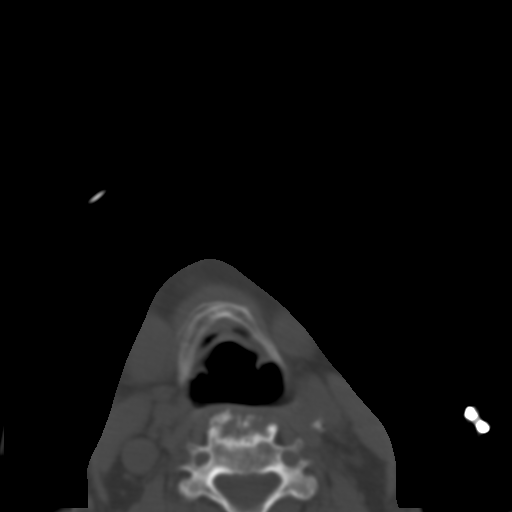
[im 14/79  bone]
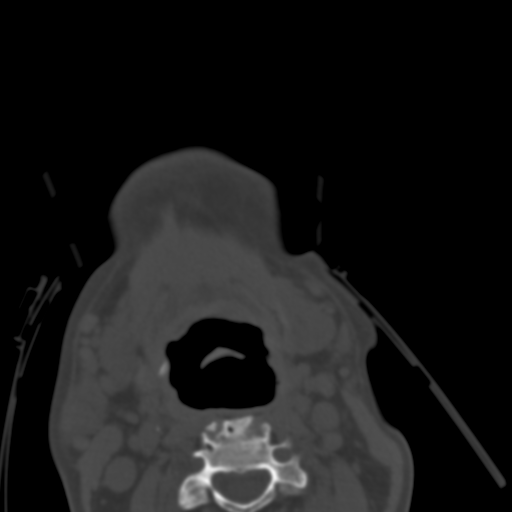
[im 22/79  bone]
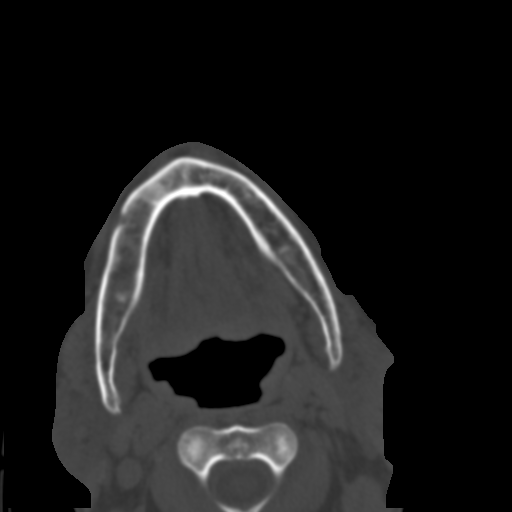
[im 27/79  bone]
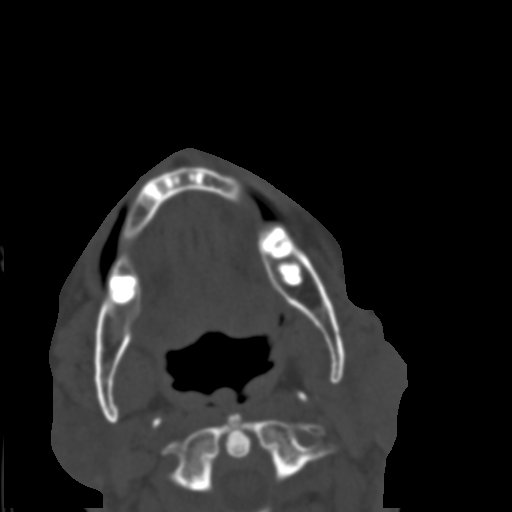
[im 35/79  brain]
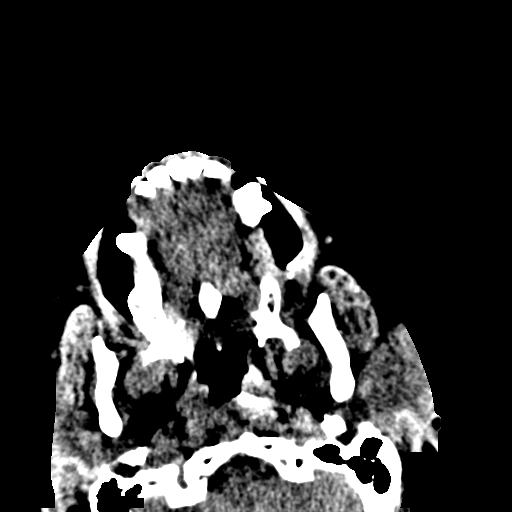
[im 35/79  bone]
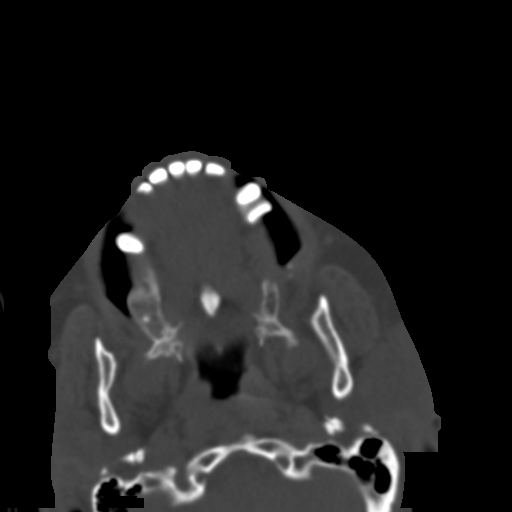
[im 44/79  bone]
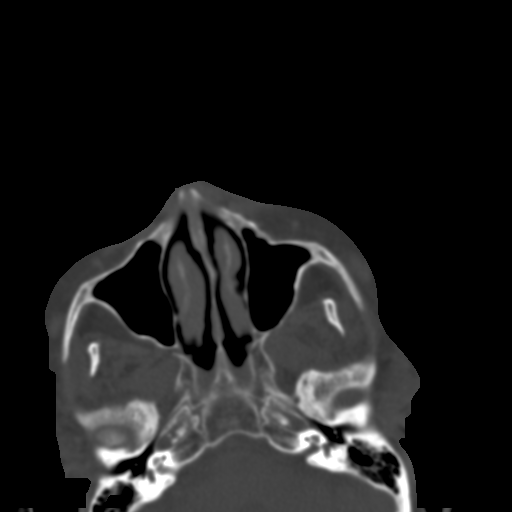
[im 52/79  bone]
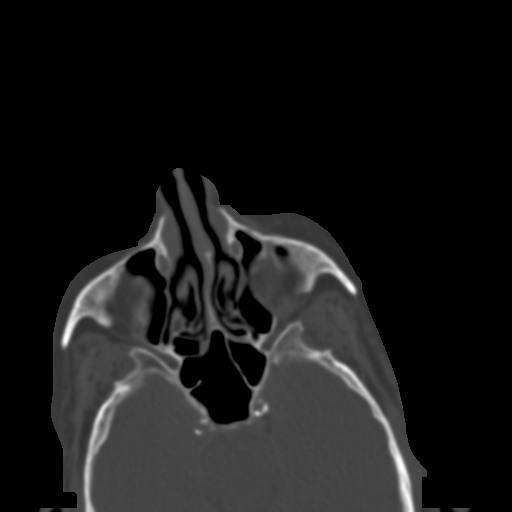
[im 60/79  bone]
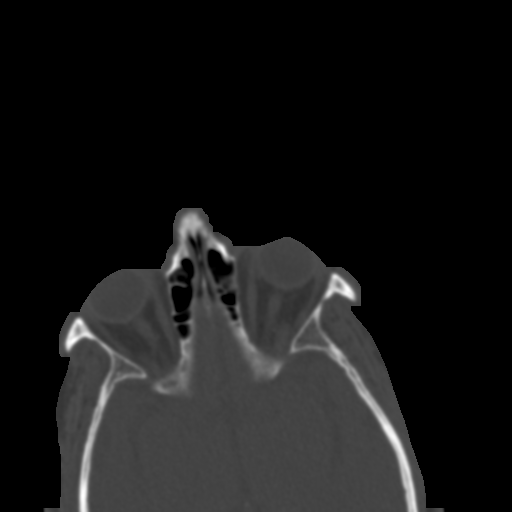
[im 65/79  brain]
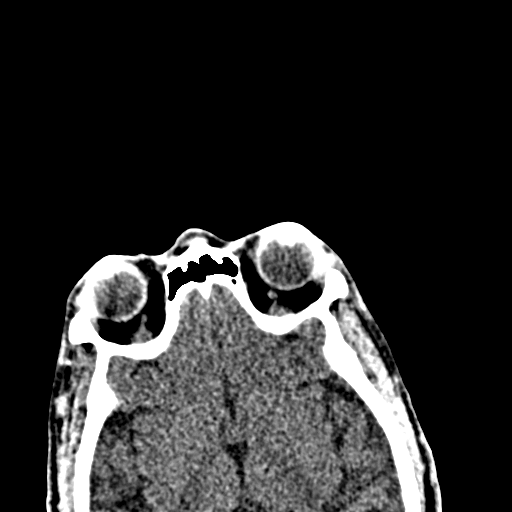
[im 65/79  bone]
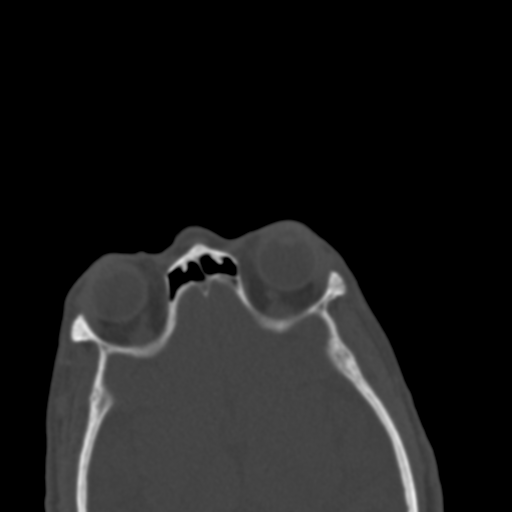
[im 73/79  bone]
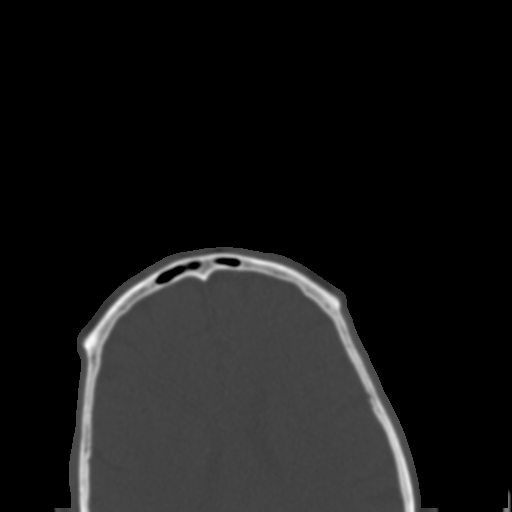

[Series 7: coronal soft tissue · coronal · 0.34mm/px · 3 of 79 slices shown]
[im 27/79  bone]
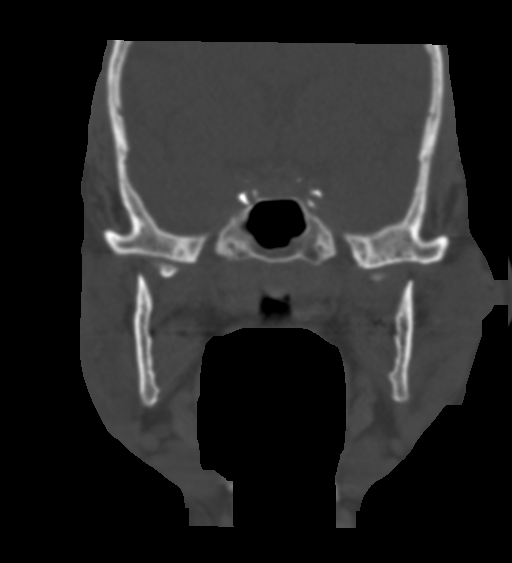
[im 35/79  bone]
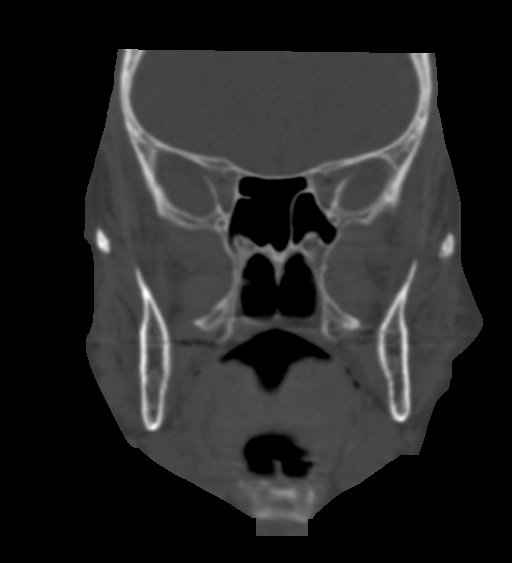
[im 44/79  bone]
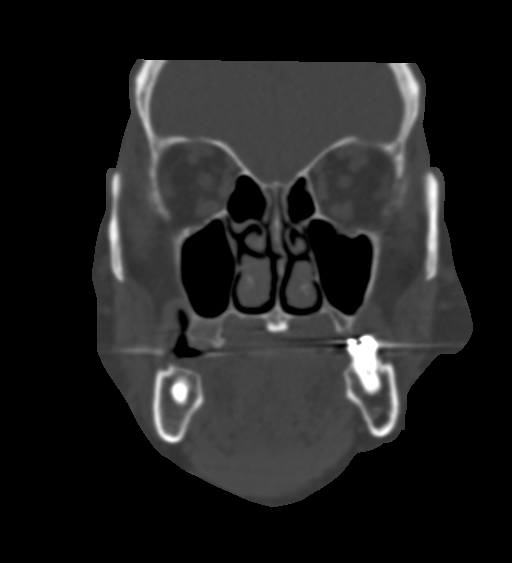

[Series 8: sagittal soft tissue · sagittal · 0.37mm/px · 3 of 73 slices shown]
[im 25/73  bone]
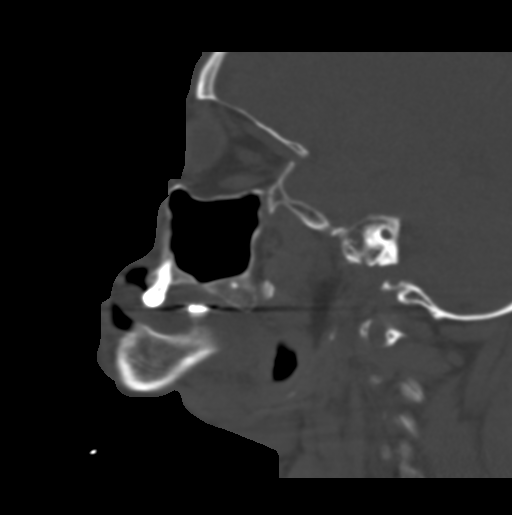
[im 37/73  bone]
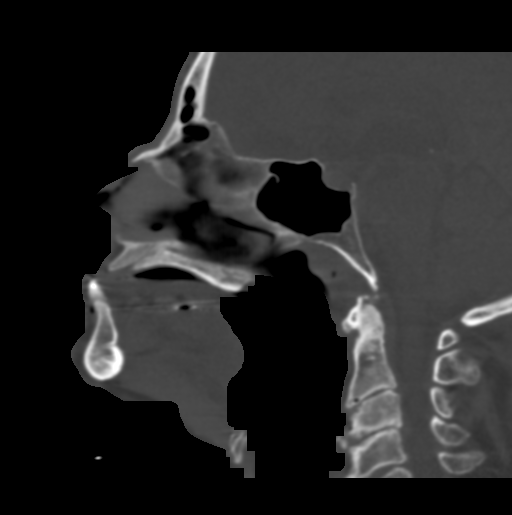
[im 49/73  bone]
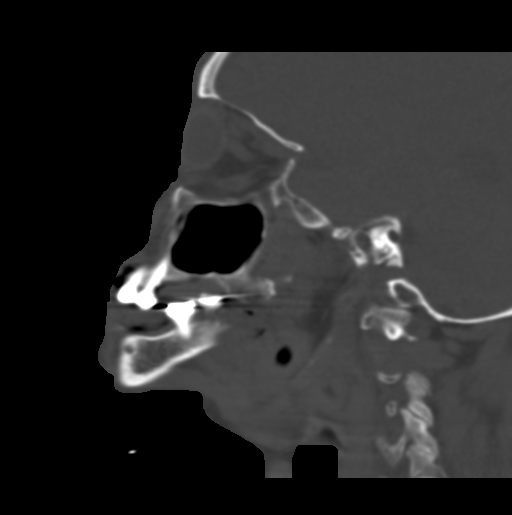

[16 of 47 positions shown; findings below may reference images not displayed]

FINDINGS: CT HEAD FINDINGS

Brain: No evidence of acute infarction, hemorrhage, hydrocephalus,
extra-axial collection or mass lesion/mass effect.

Vascular: No hyperdense vessel or unexpected calcification.

Skull: Nondisplaced fracture of the left zygomatic arch.

Other: Significant lacerations overlying the forehead and frontal
scalp. No evidence of underlying fracture.

CT MAXILLOFACIAL FINDINGS

Osseous: Acute nondisplaced fracture through the left zygomatic
arch. No additional acute facial fracture identified.

Orbits: Negative. No traumatic or inflammatory finding.

Sinuses: Clear.

Soft tissues: Focal skin thickening overlying the left forehead
likely representing contusion.
IMPRESSION: CT HEAD

1. No acute intracranial abnormality.
2. Extensive soft tissue laceration and contusion overlying the
forehead and frontal scalp.
3. Nondisplaced fracture of the left zygoma.

CT FACE

1. Nondisplaced fracture of the left zygoma.
2. Multiple missing teeth. Periodontal disease affecting the root of
the right maxillary bicuspid.
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
## 2021-12-23 IMAGING — CT CT CERVICAL SPINE W/O CM
4 series · 16 of 33 positions shown, 19 images · non-contrast
Comparison: 04/12/2019

CLINICAL DATA: Acute pain.

EXAM:
CT CERVICAL SPINE WITHOUT CONTRAST
TECHNIQUE: Multidetector CT imaging of the cervical spine was performed without
intravenous contrast. Multiplanar CT image reconstructions were also
generated.

[Series 5: c_spine 2.0 3 st · axial · 0.29mm/px · z∈[+914,+1022]mm · 5 of 82 slices shown, 7 images]
[im 14/82  soft-tissue]
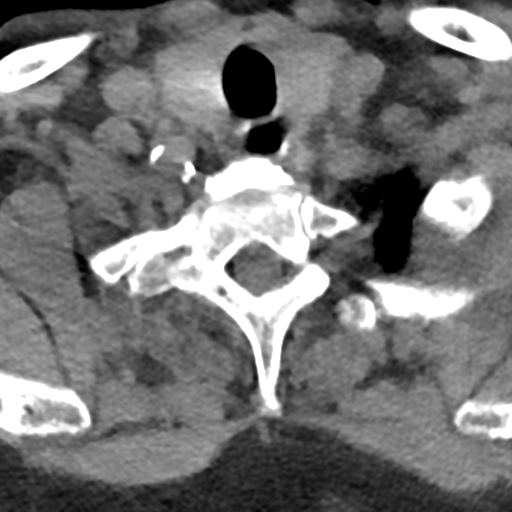
[im 14/82  bone]
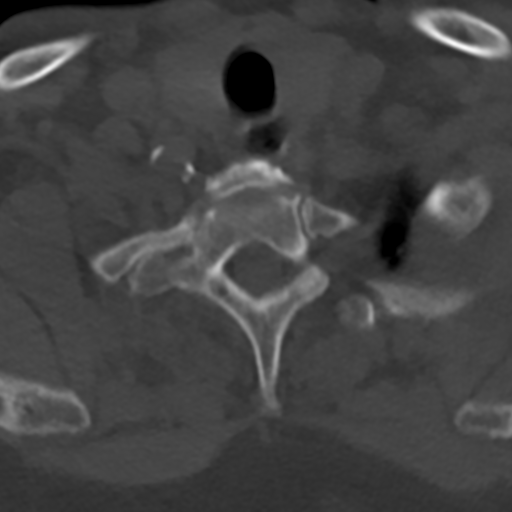
[im 28/82  bone]
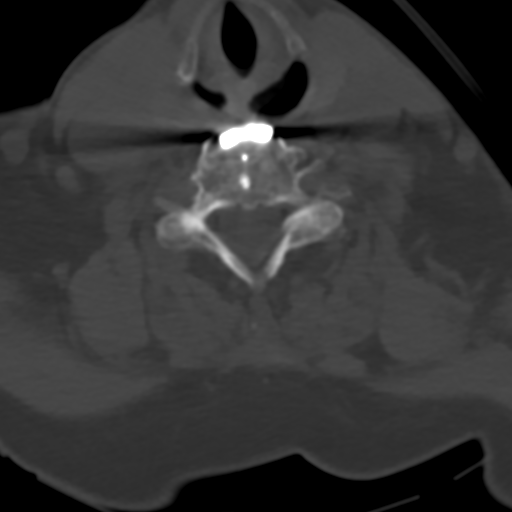
[im 41/82  bone]
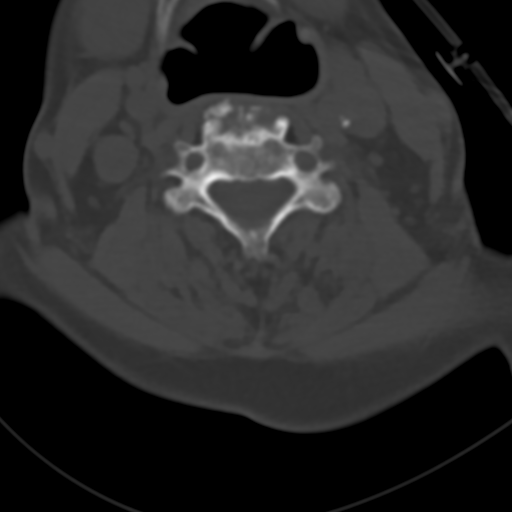
[im 55/82  bone]
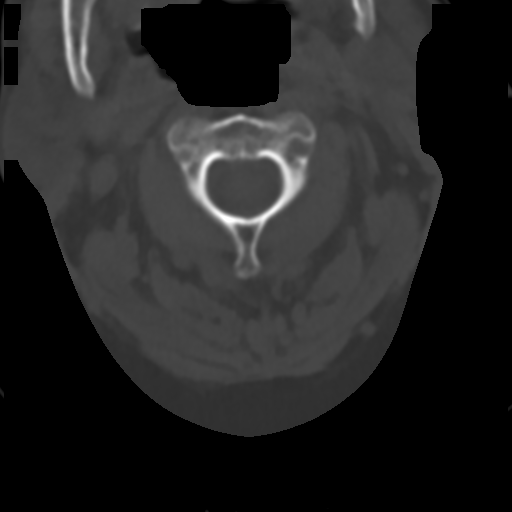
[im 68/82  soft-tissue]
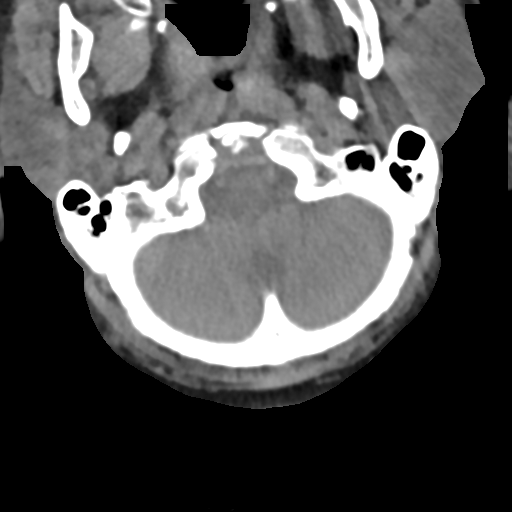
[im 68/82  bone]
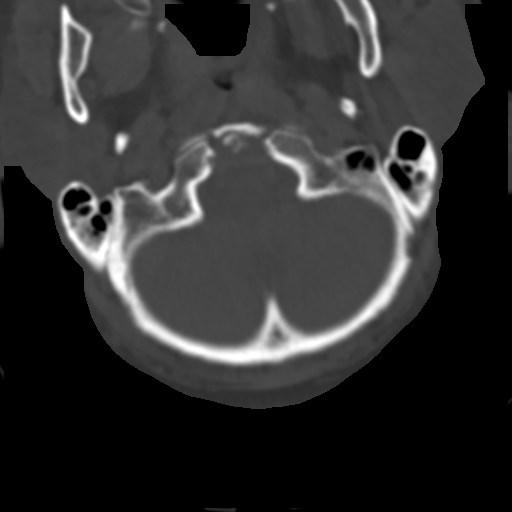

[Series 6: coronal bone · coronal · 0.28mm/px · 3 of 51 slices shown]
[im 11/51  bone]
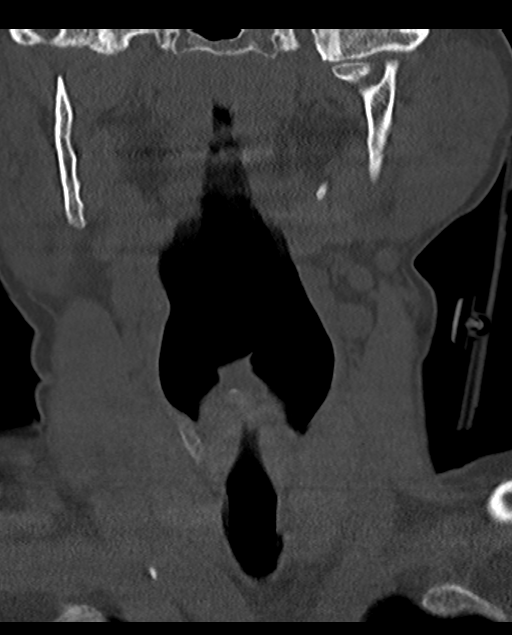
[im 21/51  bone]
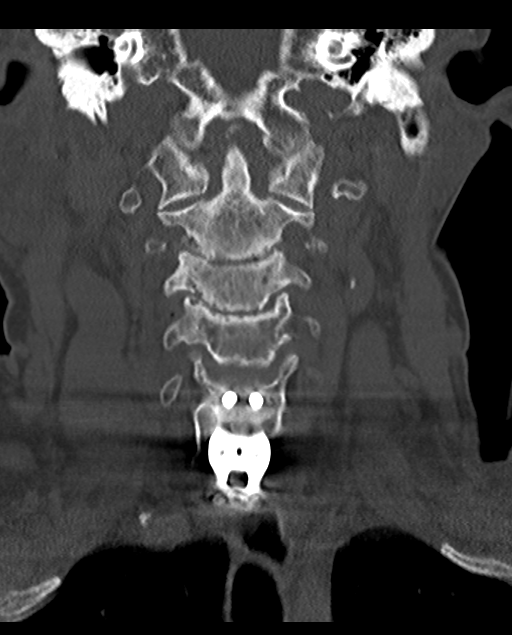
[im 31/51  bone]
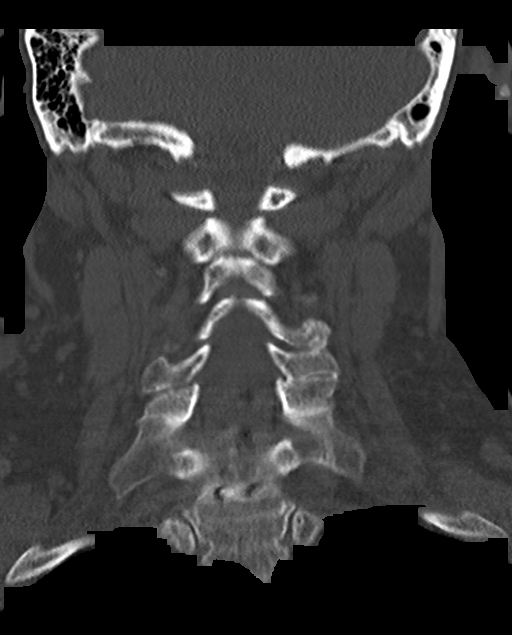

[Series 7: sagittal bone · sagittal · 0.34mm/px · 5 of 48 slices shown, 6 images]
[im 16/48  bone]
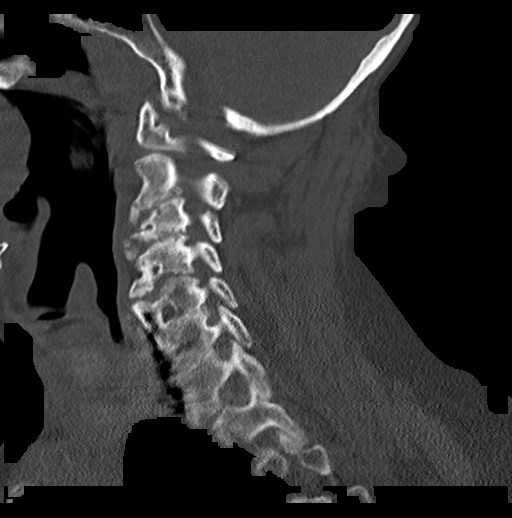
[im 20/48  bone]
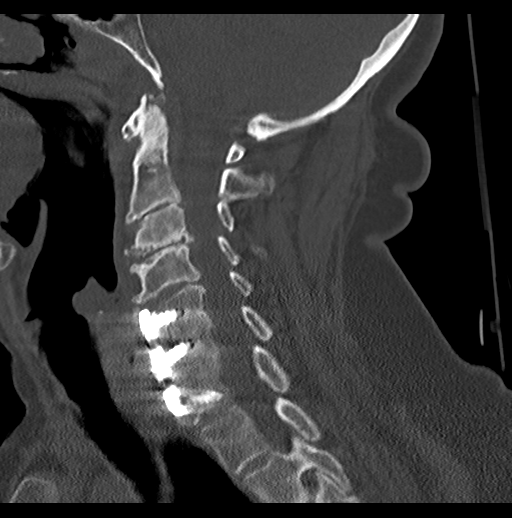
[im 24/48  soft-tissue]
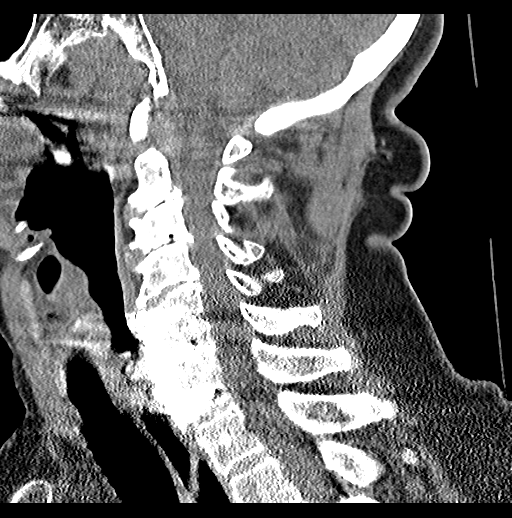
[im 24/48  bone]
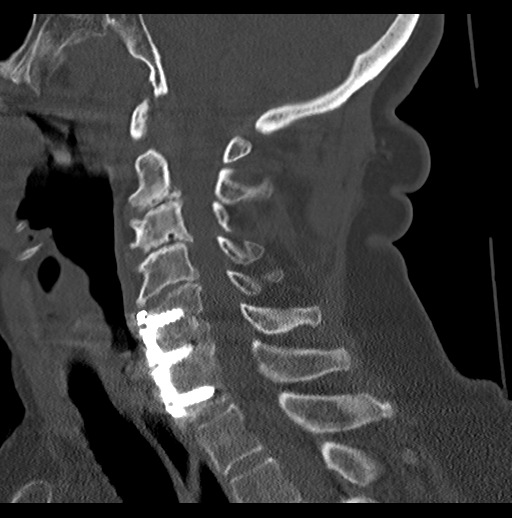
[im 28/48  bone]
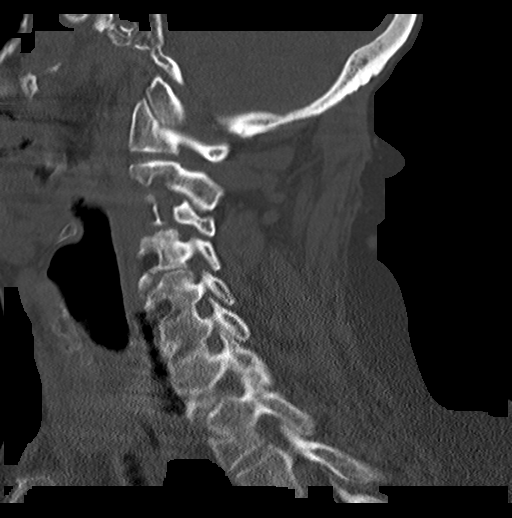
[im 32/48  bone]
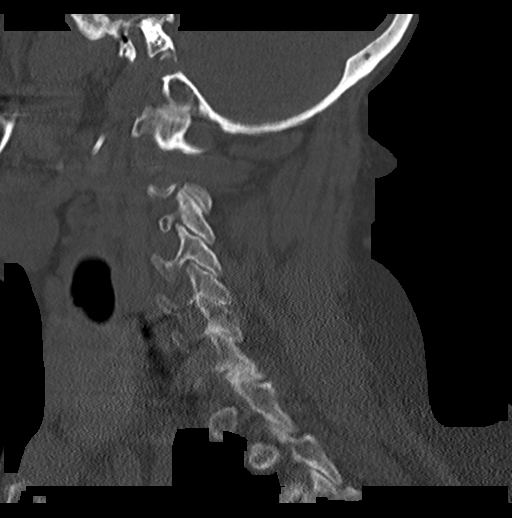

[Series 8: orthogonal axials bone · axial · 0.21mm/px · z∈[+888,+945]mm · 3 of 85 slices shown]
[im 15/85  bone]
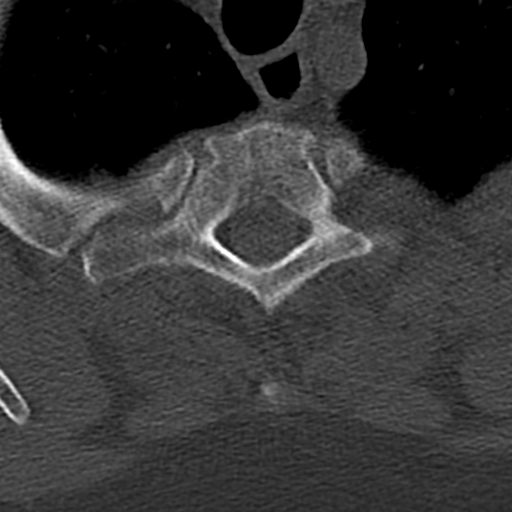
[im 29/85  bone]
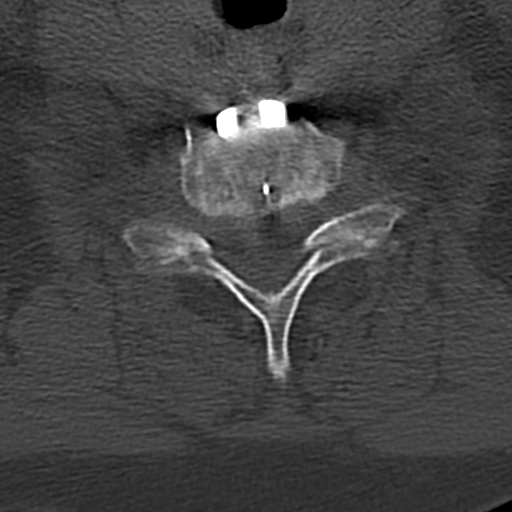
[im 43/85  bone]
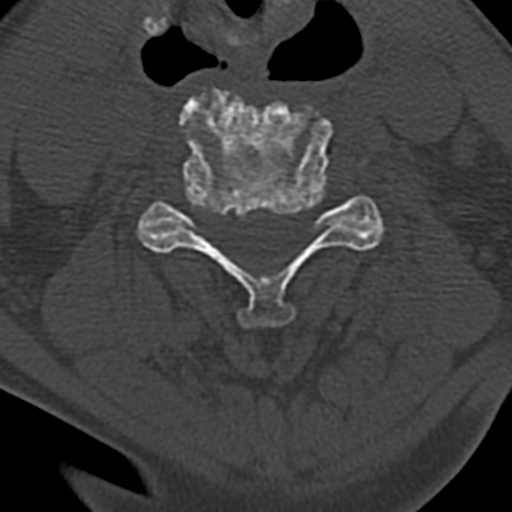

[16 of 33 positions shown; findings below may reference images not displayed]

FINDINGS: Alignment: Normal

Skull base and vertebrae: No acute fracture. No primary bone lesion
or focal pathologic process.

Soft tissues and spinal canal: No prevertebral fluid or swelling. No
visible canal hematoma.

Disc levels: Advanced degenerative disc disease and narrowing of the
interspaces C2-3 and C3-4 with posterior protrusions resulting in at
least mild spinal stenosis. Mild narrowing of the disc space C4-5
with small anterior and posterior endplate spurs. Solid-appearing
ACDF C5-7. No significant osseous foraminal stenosis.

Upper chest: Emphysema ([id number]).

Other: Bilateral carotid bifurcation calcifications.
IMPRESSION: 1. Degenerative disc disease  C2-C5.
2. Stable ACDF C5-7.
3. Bilateral calcified carotid plaque.

## 2021-12-23 IMAGING — DX DG CHEST 1V PORT
1 series · 1 of 1 positions shown · non-contrast
Comparison: 08/24/2018

CLINICAL DATA: Fall, trauma.  Chest pain.

EXAM:
PORTABLE CHEST 1 VIEW

[chest ap]
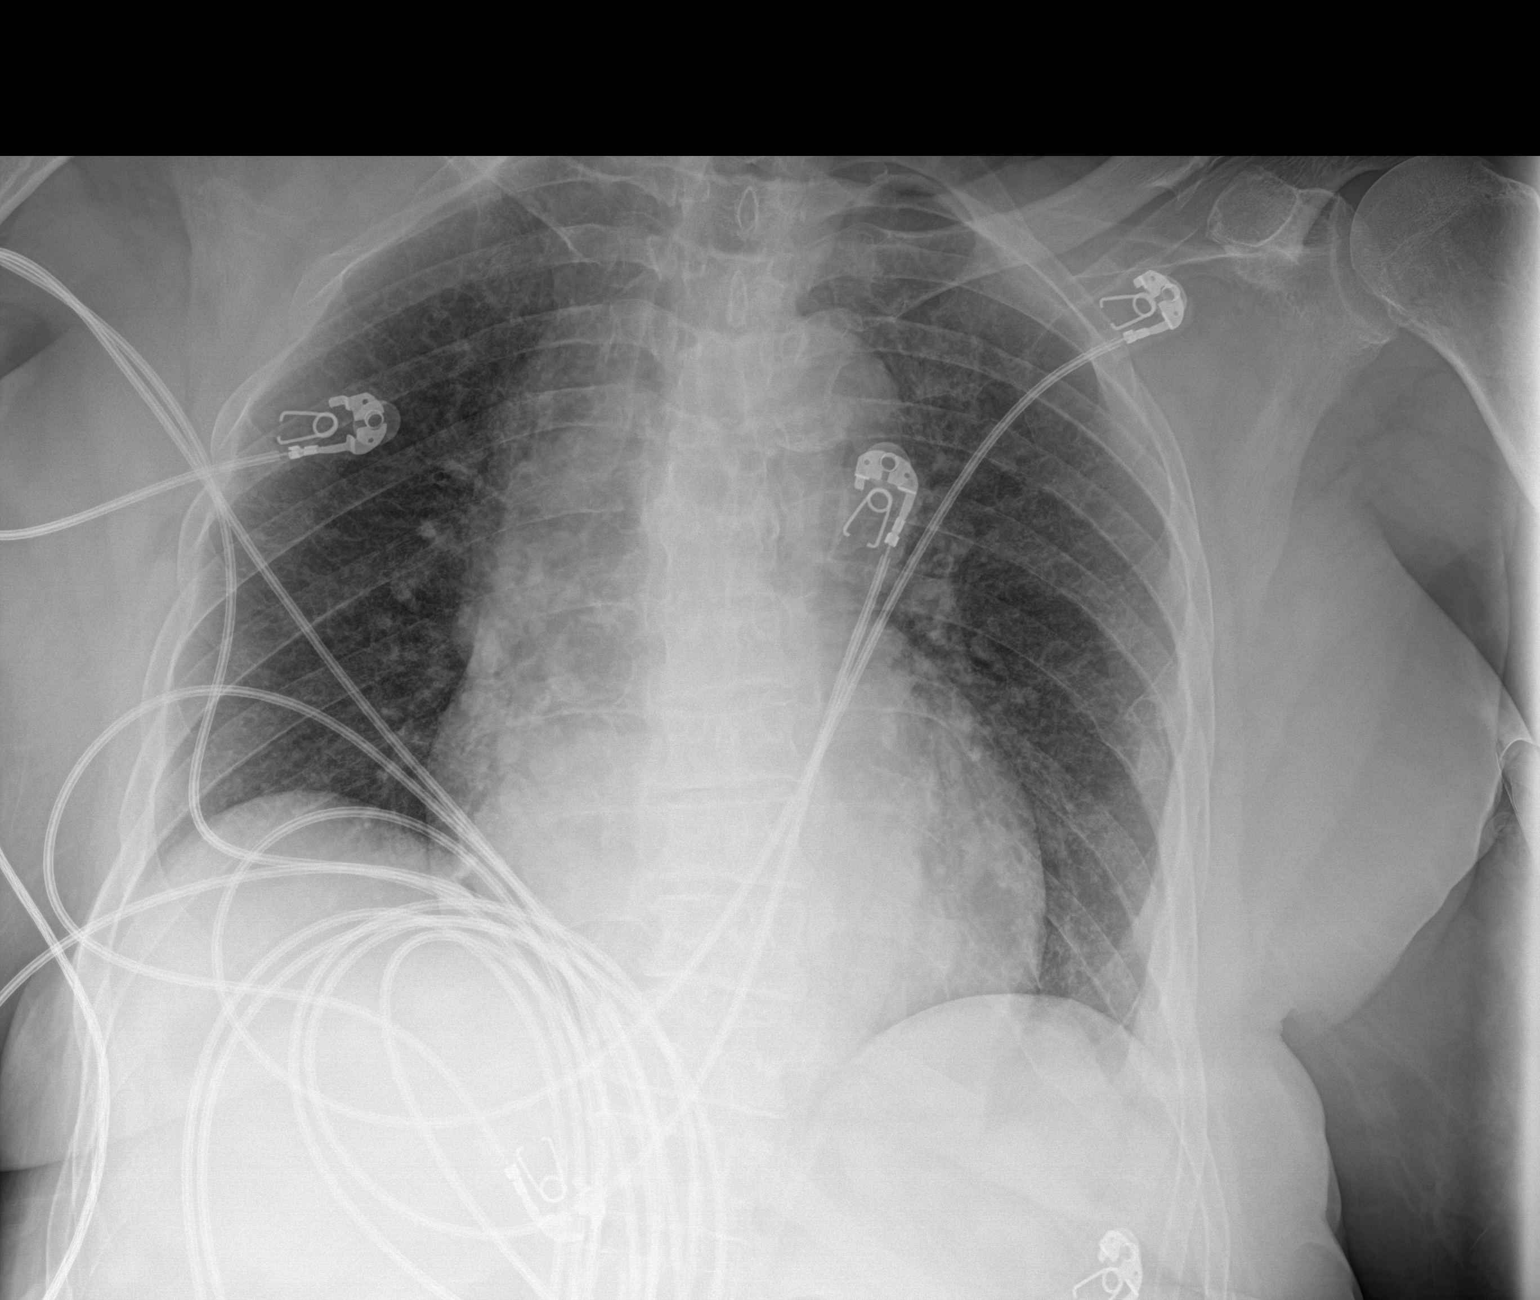

[1 of 1 positions shown; findings below may reference images not displayed]

FINDINGS: Lung volumes are low. Prominent heart size accentuated by technique.
Stable mediastinal contours with aortic atherosclerosis.
Retrocardiac hiatal hernia. No pneumothorax, evidence of pulmonary
contusion, or large pleural effusion. Surgical hardware in the lower
cervical spine. No acute osseous abnormalities are seen. Left
lateral rib fractures are remote.
IMPRESSION: 1. Low lung volumes without acute finding.
2. Retrocardiac hiatal hernia.

## 2021-12-23 IMAGING — CT CT T SPINE W/O CM
4 of 6 series · 15 of 33 positions shown, 17 images · non-contrast
Comparison: MRI of the thoracic spine May 12, 2015.

CLINICAL DATA: Mid back pain.

EXAM:
CT THORACIC SPINE WITHOUT CONTRAST
TECHNIQUE: Multidetector CT images of the thoracic were obtained using the
standard protocol without intravenous contrast.

[Series 6: t-spine 2.0 st · axial · 0.37mm/px · z∈[+692,+890]mm · 6 of 139 slices shown, 8 images (1 of 2)]
[im 20/139  soft-tissue]
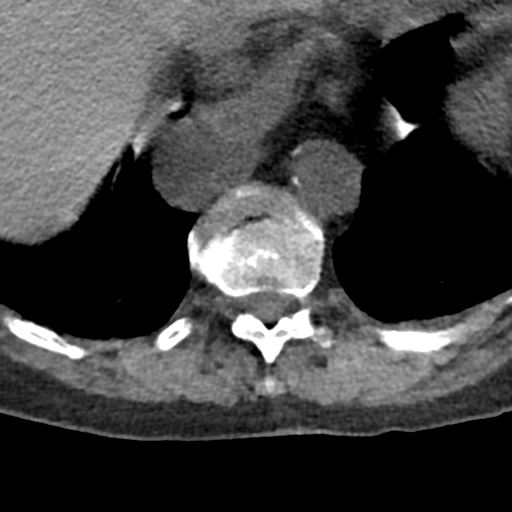
[im 20/139  bone]
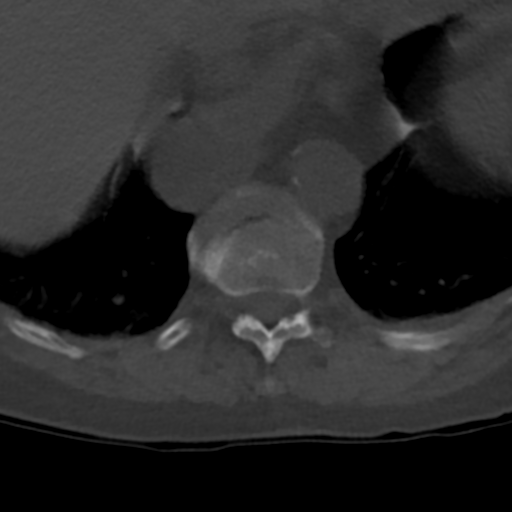
[im 40/139  bone]
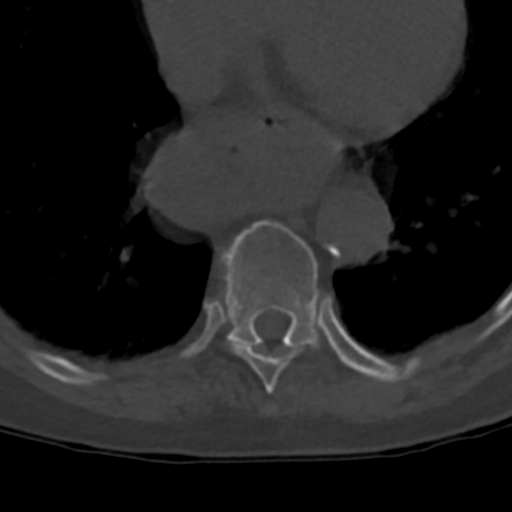
[im 60/139  bone]
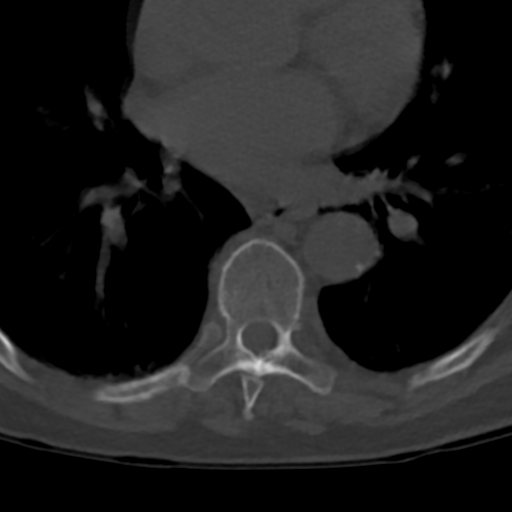
[im 79/139  bone]
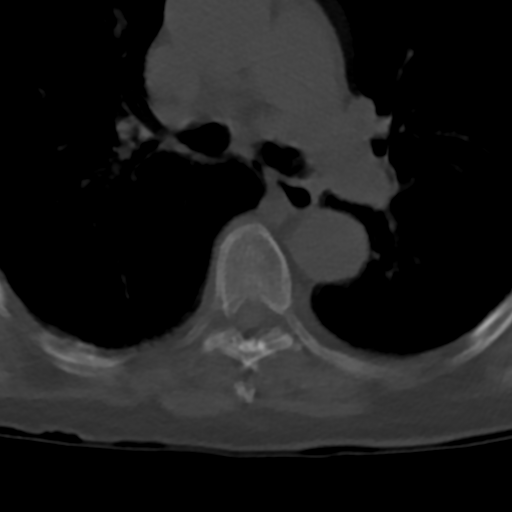
[im 99/139  soft-tissue]
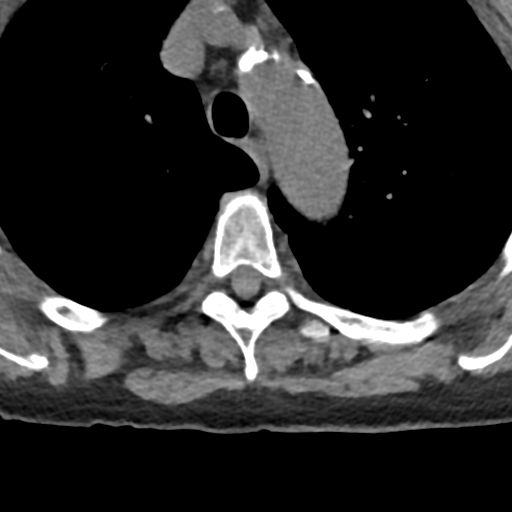
[im 99/139  bone]
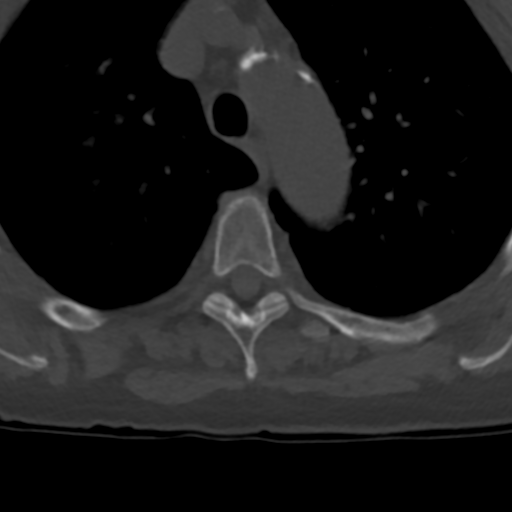
[im 119/139  bone]
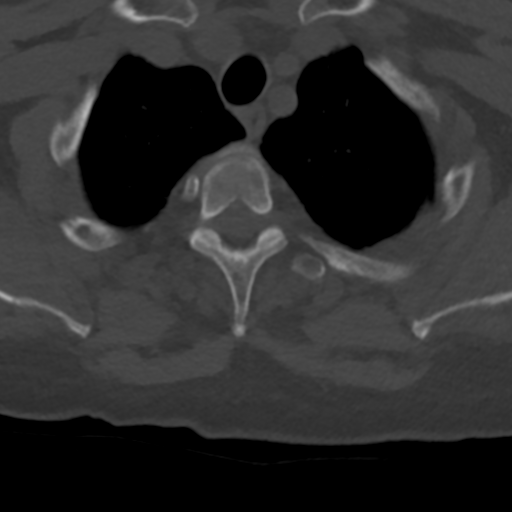

[Series 9: cor st · coronal · 0.38mm/px · 1 of 75 slices shown]
[im 38/75  bone]
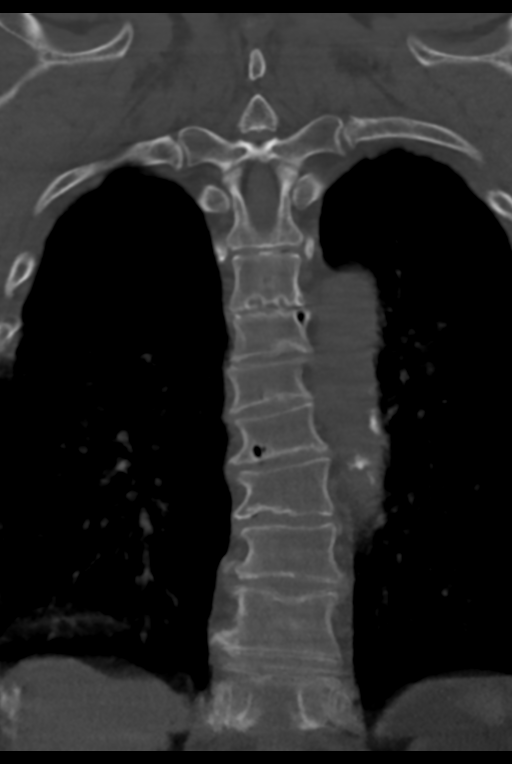

[Series 12: sagittal st · sagittal · 0.41mm/px · 5 of 61 slices shown]
[im 11/61  bone]
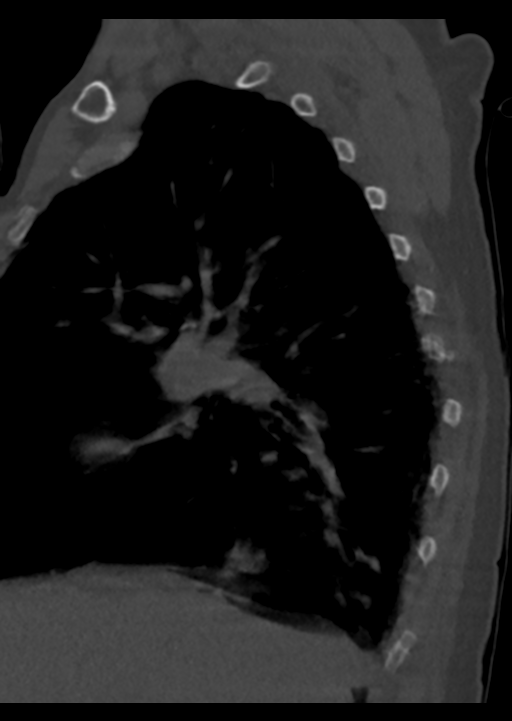
[im 21/61  bone]
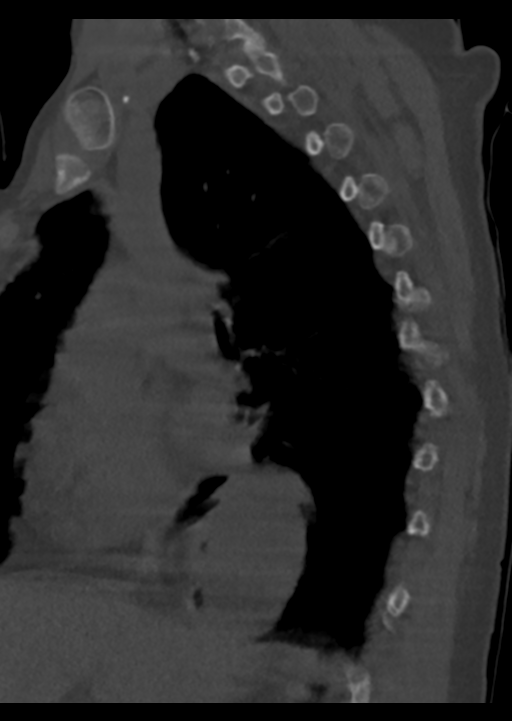
[im 31/61  bone]
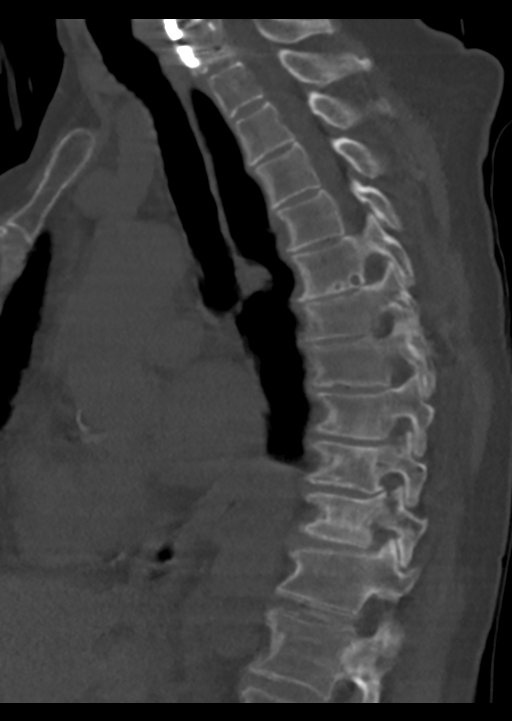
[im 41/61  bone]
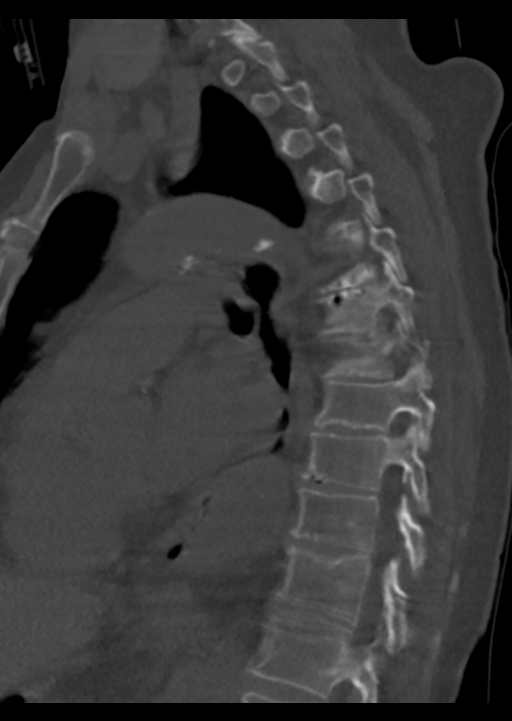
[im 51/61  bone]
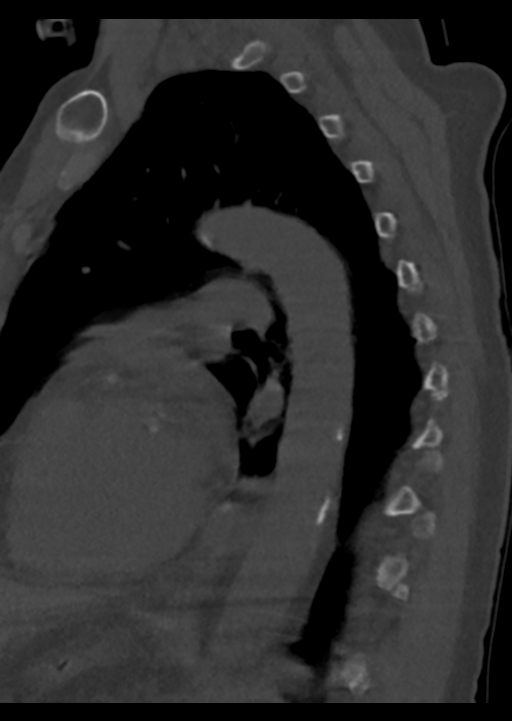

[Series 14: t-spine 2.0 st · axial · 0.37mm/px · z∈[+654,+750]mm · 3 of 120 slices shown (2 of 2)]
[im 24/120  bone]
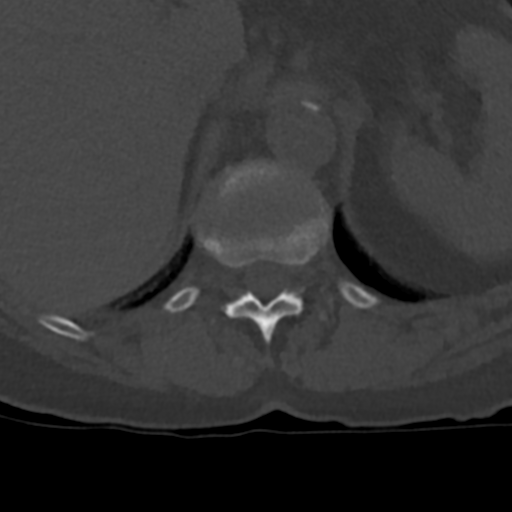
[im 48/120  bone]
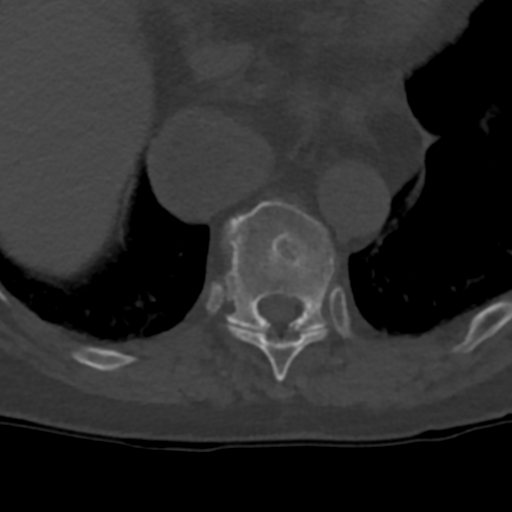
[im 72/120  bone]
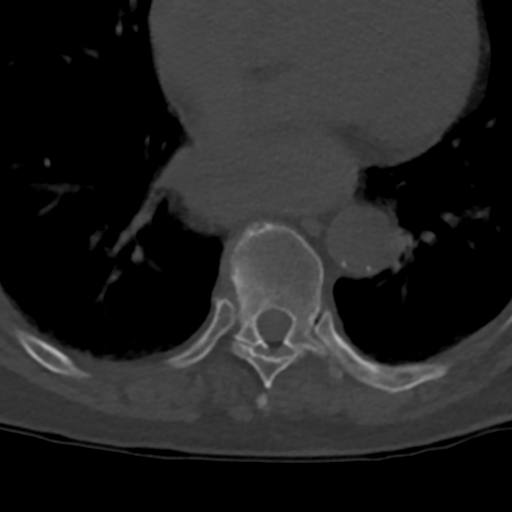

[15 of 33 positions shown; findings below may reference images not displayed]

FINDINGS: Alignment: Levoconvex scoliosis of the thoracic spine.
Anteroposterior alignment is maintained.

Vertebrae: Mild compression fracture of the superior endplate of T11
is age indeterminate. No significant associated soft tissue edema.
No focal pathologic process.

Paraspinal and other soft tissues: Large hiatal hernia.
Atherosclerotic plaques in the aorta and coronaries. Centrilobular
emphysema.

Disc levels: Endplate degenerative changes throughout the mid and
lower thoracic spine. No evidence of high-grade spinal canal or
neural foraminal stenosis.
IMPRESSION: 1. Mild compression fracture of the superior endplate of T11 is age
indeterminate. No significant associated soft tissue edema or
retropulsion. Correlation with MRI of the thoracic spine is
suggested case of pain/point of tenderness at this level.
2. Mild degenerative changes of the thoracic spine.

## 2021-12-23 IMAGING — CT CT HEAD W/O CM
3 series · 15 of 47 positions shown, 18 images · non-contrast
Comparison: Prior CT scan of the head 05/29/2015

CLINICAL DATA: 64-year-old female with facial trauma after falling
and hitting her head on the bathroom wall

EXAM:
CT HEAD WITHOUT CONTRAST
CT MAXILLOFACIAL WITHOUT CONTRAST
TECHNIQUE: Multidetector CT imaging of the head and maxillofacial structures
were performed using the standard protocol without intravenous
contrast. Multiplanar CT image reconstructions of the maxillofacial
structures were also generated.

[Series 4: head 5.0 h30s · axial · 0.41mm/px · z∈[+1041,+1166]mm · 9 of 30 slices shown, 12 images]
[im 3/30  brain]
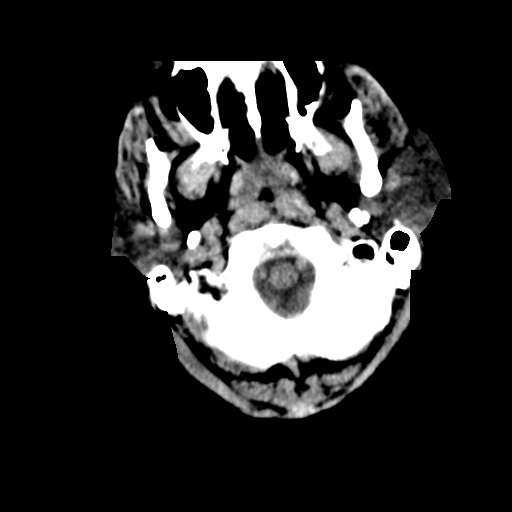
[im 3/30  bone]
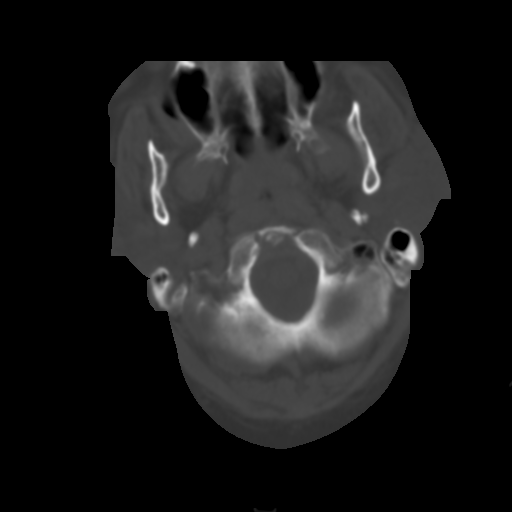
[im 6/30  brain]
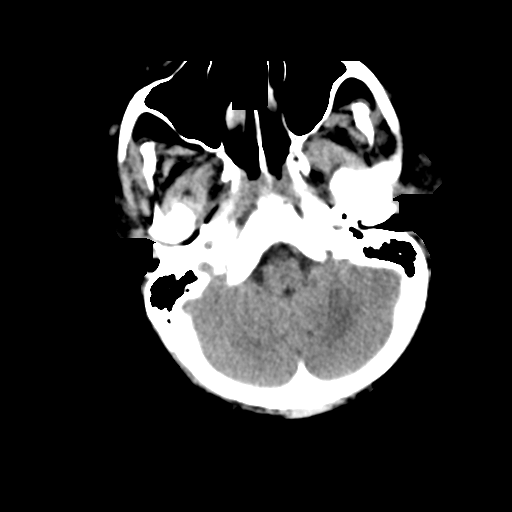
[im 9/30  brain]
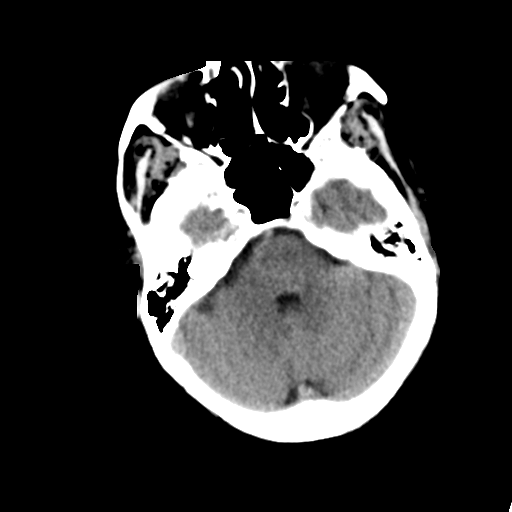
[im 12/30  brain]
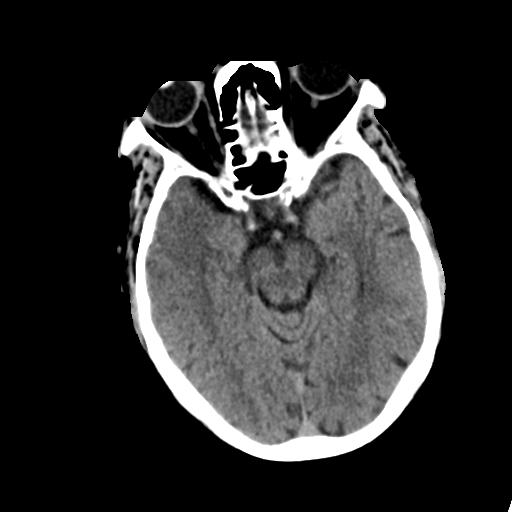
[im 16/30  brain]
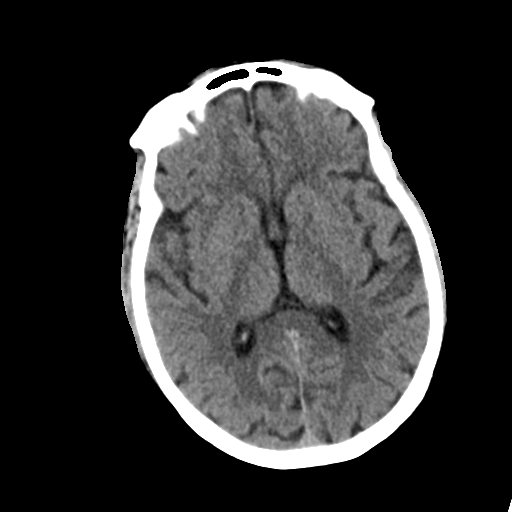
[im 16/30  bone]
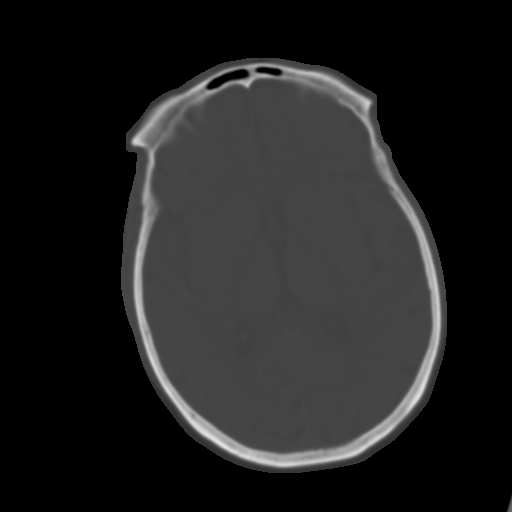
[im 19/30  brain]
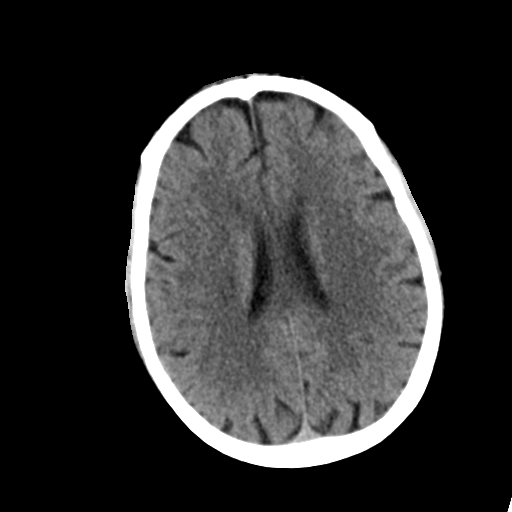
[im 22/30  brain]
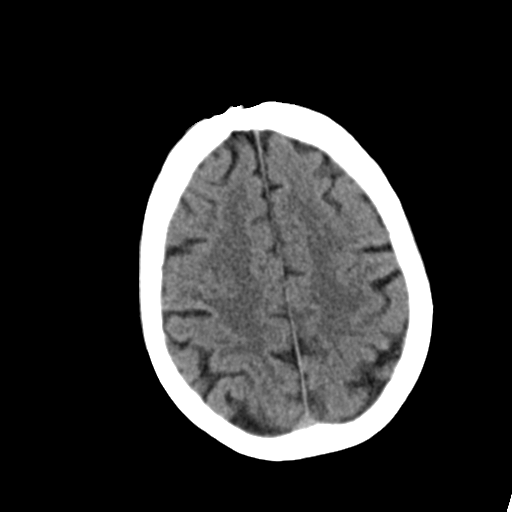
[im 25/30  brain]
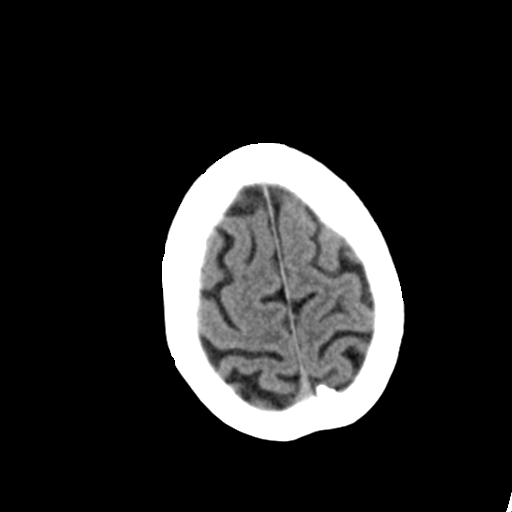
[im 28/30  brain]
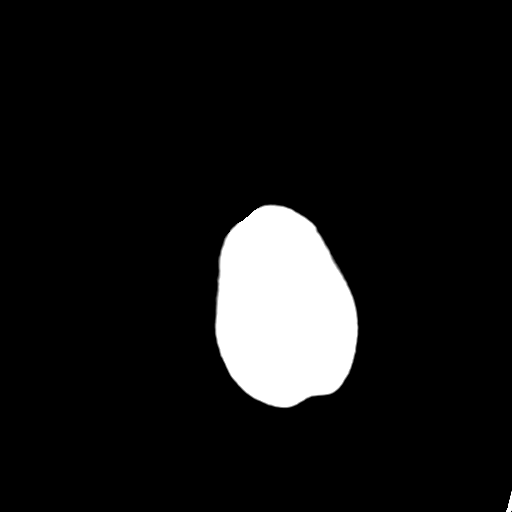
[im 28/30  bone]
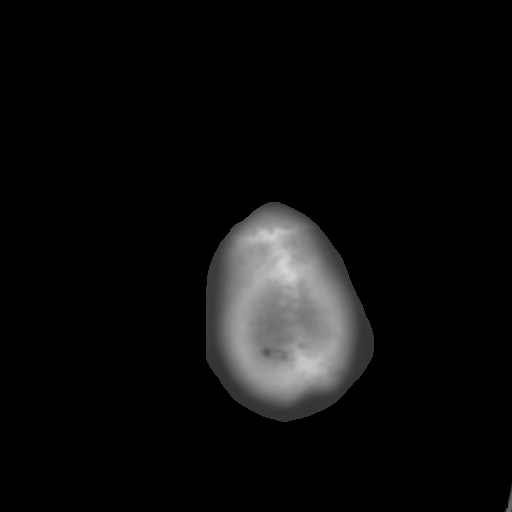

[Series 5: head 3.0 mpr cor · coronal · 0.31mm/px · 3 of 62 slices shown]
[im 21/62  brain]
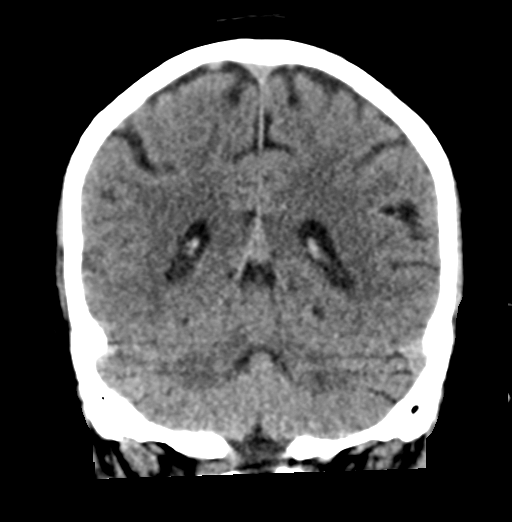
[im 28/62  brain]
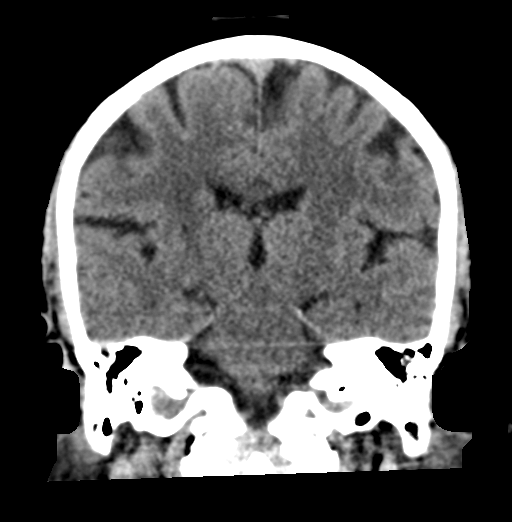
[im 34/62  brain]
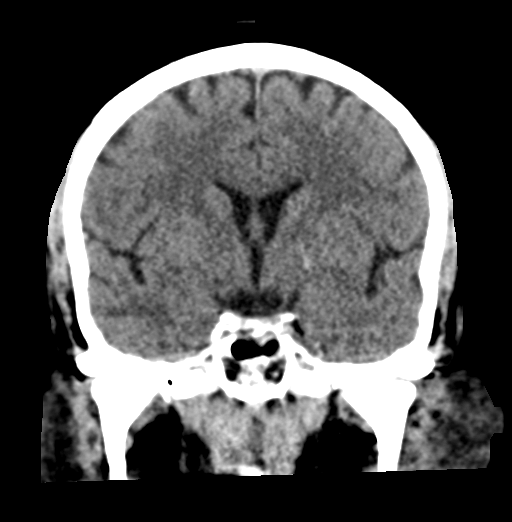

[Series 6: head 3.0 mpr sag · sagittal · 0.30mm/px · 3 of 51 slices shown]
[im 17/51  brain]
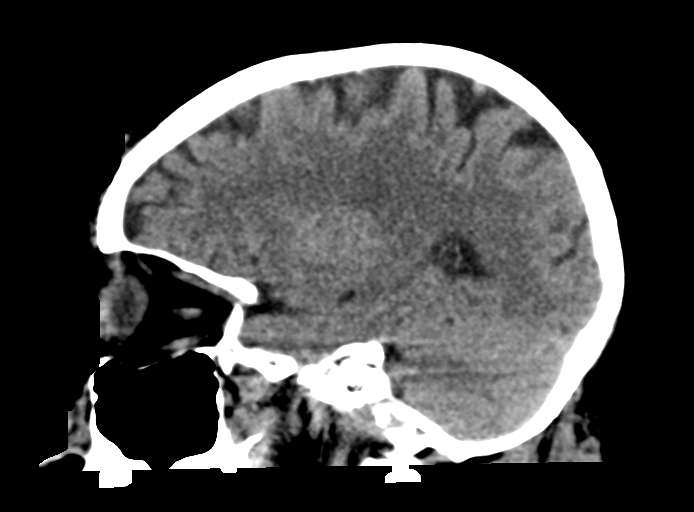
[im 26/51  brain]
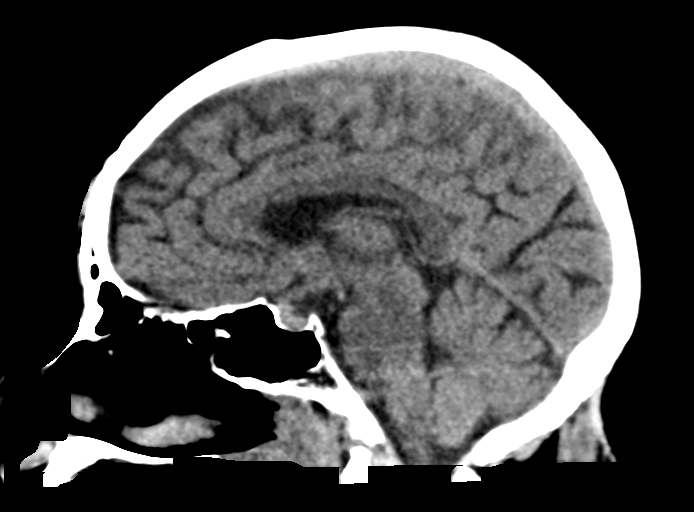
[im 34/51  brain]
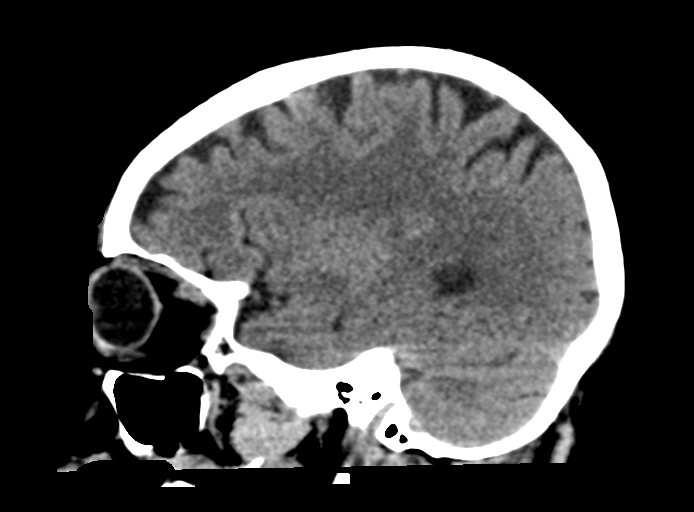

[15 of 47 positions shown; findings below may reference images not displayed]

FINDINGS: CT HEAD FINDINGS

Brain: No evidence of acute infarction, hemorrhage, hydrocephalus,
extra-axial collection or mass lesion/mass effect.

Vascular: No hyperdense vessel or unexpected calcification.

Skull: Nondisplaced fracture of the left zygomatic arch.

Other: Significant lacerations overlying the forehead and frontal
scalp. No evidence of underlying fracture.

CT MAXILLOFACIAL FINDINGS

Osseous: Acute nondisplaced fracture through the left zygomatic
arch. No additional acute facial fracture identified.

Orbits: Negative. No traumatic or inflammatory finding.

Sinuses: Clear.

Soft tissues: Focal skin thickening overlying the left forehead
likely representing contusion.
IMPRESSION: CT HEAD

1. No acute intracranial abnormality.
2. Extensive soft tissue laceration and contusion overlying the
forehead and frontal scalp.
3. Nondisplaced fracture of the left zygoma.

CT FACE

1. Nondisplaced fracture of the left zygoma.
2. Multiple missing teeth. Periodontal disease affecting the root of
the right maxillary bicuspid.

## 2022-01-16 ENCOUNTER — Emergency Department (HOSPITAL_COMMUNITY)
Admission: EM | Admit: 2022-01-16 | Discharge: 2022-01-16 | Disposition: A | Discharge status: Home / Self Care | Payer: 59 | Attending: Emergency Medicine | Admitting: Emergency Medicine

## 2022-01-16 ENCOUNTER — Emergency Department (HOSPITAL_COMMUNITY): Payer: 59

## 2022-01-16 ENCOUNTER — Encounter (HOSPITAL_COMMUNITY): Payer: Self-pay

## 2022-01-16 DIAGNOSIS — N183 Chronic kidney disease, stage 3 unspecified: Secondary | ICD-10-CM | POA: Insufficient documentation

## 2022-01-16 DIAGNOSIS — I129 Hypertensive chronic kidney disease with stage 1 through stage 4 chronic kidney disease, or unspecified chronic kidney disease: Secondary | ICD-10-CM | POA: Diagnosis not present

## 2022-01-16 DIAGNOSIS — Z79899 Other long term (current) drug therapy: Secondary | ICD-10-CM | POA: Insufficient documentation

## 2022-01-16 DIAGNOSIS — M25561 Pain in right knee: Secondary | ICD-10-CM | POA: Diagnosis not present

## 2022-01-16 DIAGNOSIS — R300 Dysuria: Secondary | ICD-10-CM | POA: Diagnosis present

## 2022-01-16 DIAGNOSIS — N3000 Acute cystitis without hematuria: Secondary | ICD-10-CM | POA: Insufficient documentation

## 2022-01-16 LAB — URINALYSIS, ROUTINE W REFLEX MICROSCOPIC
Bilirubin Urine: NEGATIVE
Glucose, UA: NEGATIVE mg/dL
Hgb urine dipstick: NEGATIVE
Ketones, ur: NEGATIVE mg/dL
Nitrite: POSITIVE — AB
Protein, ur: NEGATIVE mg/dL
Specific Gravity, Urine: 1.023 (ref 1.005–1.030)
pH: 6 (ref 5.0–8.0)

## 2022-01-16 LAB — CBC WITH DIFFERENTIAL/PLATELET
Abs Immature Granulocytes: 0.02 10*3/uL (ref 0.00–0.07)
Basophils Absolute: 0 10*3/uL (ref 0.0–0.1)
Basophils Relative: 1 %
Eosinophils Absolute: 0.3 10*3/uL (ref 0.0–0.5)
Eosinophils Relative: 4 %
HCT: 37.8 % (ref 36.0–46.0)
Hemoglobin: 12 g/dL (ref 12.0–15.0)
Immature Granulocytes: 0 %
Lymphocytes Relative: 38 %
Lymphs Abs: 2.9 10*3/uL (ref 0.7–4.0)
MCH: 28.6 pg (ref 26.0–34.0)
MCHC: 31.7 g/dL (ref 30.0–36.0)
MCV: 90 fL (ref 80.0–100.0)
Monocytes Absolute: 0.4 10*3/uL (ref 0.1–1.0)
Monocytes Relative: 5 %
Neutro Abs: 4.2 10*3/uL (ref 1.7–7.7)
Neutrophils Relative %: 52 %
Platelets: 402 10*3/uL — ABNORMAL HIGH (ref 150–400)
RBC: 4.2 MIL/uL (ref 3.87–5.11)
RDW: 15.1 % (ref 11.5–15.5)
WBC: 7.8 10*3/uL (ref 4.0–10.5)
nRBC: 0 % (ref 0.0–0.2)

## 2022-01-16 LAB — BASIC METABOLIC PANEL
Anion gap: 8 (ref 5–15)
BUN: 24 mg/dL — ABNORMAL HIGH (ref 8–23)
CO2: 23 mmol/L (ref 22–32)
Calcium: 9 mg/dL (ref 8.9–10.3)
Chloride: 107 mmol/L (ref 98–111)
Creatinine, Ser: 1.04 mg/dL — ABNORMAL HIGH (ref 0.44–1.00)
GFR, Estimated: 59 mL/min — ABNORMAL LOW (ref >60)
Glucose, Bld: 108 mg/dL — ABNORMAL HIGH (ref 70–99)
Potassium: 4.4 mmol/L (ref 3.5–5.1)
Sodium: 138 mmol/L (ref 135–145)

## 2022-01-16 MED ORDER — OXYCODONE-ACETAMINOPHEN 5-325 MG PO TABS
1.0000 | ORAL_TABLET | Freq: Once | ORAL | Status: AC
  Administered 2022-01-16: 1 via ORAL
  Filled 2022-01-16: qty 1

## 2022-01-16 MED ORDER — IBUPROFEN 800 MG PO TABS: 800.0000 mg | ORAL_TABLET | Freq: Four times a day (QID) | ORAL | 0 refills | Status: DC | PRN

## 2022-01-16 MED ORDER — CEPHALEXIN 500 MG PO CAPS: 500.0000 mg | ORAL_CAPSULE | Freq: Four times a day (QID) | ORAL | 0 refills | Status: DC

## 2022-01-16 MED ORDER — SODIUM CHLORIDE 0.9 % IV BOLUS: 500.0000 mL | Freq: Once | INTRAVENOUS | Status: DC

## 2022-01-16 NOTE — ED Triage Notes (Signed)
Pt arrived via POV, c/o dysuria x1 week and right knee pain after running into walker with electric wheelchair.

## 2022-01-16 NOTE — Discharge Instructions (Signed)
Thank you for allowing me to be part of your care today.  Your urine was positive for an infection.  I have sent a prescription for an antibiotic and ibuprofen to your pharmacy.  I recommend following up with your primary care provider if your symptoms do not improve.

## 2022-01-16 NOTE — ED Provider Notes (Signed)
[facility] DEPT Provider Note   CSN: [id number] Arrival date & time: 01/16/22  1448     History  Chief Complaint  Patient presents with   Dysuria    [name] is a 67 y.o. female presents to the ED complaining of dysuria and lower abdominal pain for the past week.  She states it has been worse for the last 3 days and she has felt very weak.  She also reports that this morning when she was using her electric wheelchair, she ran into her rolling walker and hit her right knee.  She is complaining of right knee pain, but states both of her legs hurt because of her rheumatoid arthritis.  Denies known fever, but reports chills and night sweats.  Denies nausea, vomiting, diarrhea, syncope, lightheadedness, hematuria.           Home Medications Prior to Admission medications   Medication Sig Start Date End Date Taking? Authorizing Provider  cephALEXin (KEFLEX) 500 MG capsule Take 1 capsule (500 mg total) by mouth 4 (four) times daily. 01/16/22  Yes Bernice Mcauliffe R, PA  ibuprofen (ADVIL) 800 MG tablet Take 1 tablet (800 mg total) by mouth every 6 (six) hours as needed. 01/16/22  Yes Sybol Morre R, PA  albuterol (VENTOLIN HFA) 108 (90 Base) MCG/ACT inhaler Inhale 1-2 puffs into the lungs every 6 (six) hours as needed for wheezing or shortness of breath. 11/24/20   Kathie Dike, MD  amitriptyline (ELAVIL) 50 MG tablet Take 50 mg by mouth at bedtime. 09/17/21   [provider]  amLODipine (NORVASC) 10 MG tablet Take 10 mg by mouth every morning. 03/12/19   [provider]  Celedonio Miyamoto 62.5-25 MCG/ACT AEPB Inhale 1 puff into the lungs daily. 09/17/21   [provider]  atorvastatin (LIPITOR) 40 MG tablet Take 1 tablet (40 mg total) by mouth daily at 6 PM. Patient taking differently: Take 40 mg by mouth at bedtime. 08/22/14   Rai, Vernelle Emerald, MD  baclofen (LIORESAL) 10 MG tablet Take 10 mg by mouth 3 (three) times daily as needed for muscle  spasms. 05/30/21   [provider]  celecoxib (CELEBREX) 200 MG capsule Take 1 capsule (200 mg total) by mouth every 12 (twelve) hours. 11/05/21   Meade Maw, MD  diclofenac Sodium (VOLTAREN) 1 % GEL Apply 2 g topically 4 (four) times daily as needed (joint pain). 07/18/21   Lacinda Axon, MD  DULoxetine (CYMBALTA) 30 MG capsule Take 30 mg by mouth daily. 03/05/21   [provider]  fluticasone (FLONASE) 50 MCG/ACT nasal spray Place 1 spray into both nostrils daily as needed for allergies. 07/13/21   [provider]  gabapentin (NEURONTIN) 400 MG capsule Take 400-800 mg by mouth in the morning, at noon, and at bedtime. 400 mg am, 400 mg afternoon, 800 mg at bedtime 03/26/21   [provider]  lidocaine (LIDODERM) 5 % Place 1 patch onto the skin daily as needed. Remove & Discard patch within 12 hours or as directed by MD 07/02/21   Jeanell Sparrow, DO  linaclotide (LINZESS) 145 MCG CAPS capsule Take 145 mcg by mouth daily as needed.    [provider]  methylPREDNISolone (MEDROL DOSEPAK) 4 MG TBPK tablet Day 1: '8mg'$  before breakfast, 4 mg after lunch, 4 mg after supper, and 8 mg at bedtime Day 2: 4 mg before breakfast, 4 mg after lunch, 4 mg  after supper, and 8 mg  at bedtime Day 3:  4 mg  before breakfast, 4 mg  after lunch, 4 mg after supper, and 4 mg  at bedtime Day 4: 4 mg  before breakfast, 4 mg  after lunch, and 4 mg at bedtime Day 5: 4 mg  before breakfast and 4 mg at bedtime Day 6: 4 mg  before breakfast 11/15/21   Deno Etienne, DO  naloxone [facility]) nasal spray 4 mg/0.1 mL Place 1 spray into the nose once. As needed for overdose    [provider]  oxyCODONE-acetaminophen (PERCOCET) 10-325 MG tablet Take 1 tablet by mouth every 4 (four) hours as needed for pain.    [provider]  pantoprazole (PROTONIX) 40 MG tablet Take 40 mg by mouth daily.    [provider]      Allergies    Iodine-131, Iohexol, Levofloxacin,  Zofran [ondansetron hcl], Heparin, and Morphine and related    Review of Systems   Review of Systems  Constitutional:  Positive for chills. Negative for fever.  Gastrointestinal:  Positive for abdominal pain. Negative for diarrhea, nausea and vomiting.  Genitourinary:  Positive for dysuria, flank pain and frequency. Negative for hematuria.  Neurological:  Positive for weakness. Negative for syncope and light-headedness.    Physical Exam Updated Vital Signs BP (!) 134/92 (BP Location: Right Arm)   Pulse 63   Temp 98.1 F (36.7 C) (Oral)   Resp 16   SpO2 100%  Physical Exam Vitals and nursing note reviewed.  Constitutional:      General: She is not in acute distress.    Appearance: She is not ill-appearing.  HENT:     Mouth/Throat:     Mouth: Mucous membranes are moist.     Pharynx: Oropharynx is clear.  Cardiovascular:     Rate and Rhythm: Normal rate and regular rhythm.     Pulses: Normal pulses.     Heart sounds: Normal heart sounds.  Pulmonary:     Effort: Pulmonary effort is normal. No respiratory distress.     Breath sounds: Normal breath sounds and air entry.  Abdominal:     General: Abdomen is flat. Bowel sounds are normal. There is no distension.     Palpations: Abdomen is soft.     Tenderness: There is abdominal tenderness in the suprapubic area. There is right CVA tenderness and left CVA tenderness.  Musculoskeletal:     Right knee: Bony tenderness present. No swelling. Normal range of motion. Tenderness present.     Comments: Generalized tenderness to right knee; patient complaining of both legs hurting with hx significant for RA  Skin:    General: Skin is warm and dry.     Capillary Refill: Capillary refill takes less than 2 seconds.  Neurological:     Mental Status: She is alert. Mental status is at baseline.  Psychiatric:        Mood and Affect: Mood normal.        Behavior: Behavior normal.     ED Results / Procedures / Treatments   Labs (all labs  ordered are listed, but only abnormal results are displayed) Labs Reviewed  BASIC METABOLIC PANEL - Abnormal; Notable for the following components:      Result Value   Glucose, Bld 108 (*)    BUN 24 (*)    Creatinine, Ser 1.04 (*)    GFR, Estimated 59 (*)    All other components within normal limits  CBC WITH DIFFERENTIAL/PLATELET - Abnormal; Notable for the following components:   Platelets 402 (*)  All other components within normal limits  URINALYSIS, ROUTINE W REFLEX MICROSCOPIC - Abnormal; Notable for the following components:   APPearance HAZY (*)    Nitrite POSITIVE (*)    Leukocytes,Ua SMALL (*)    Bacteria, UA MANY (*)    All other components within normal limits    EKG None  Radiology DG Knee Complete 4 Views Right  Result Date: 01/16/2022 CLINICAL DATA:  Knee injury. EXAM: RIGHT KNEE - COMPLETE 4+ VIEW COMPARISON:  None Available. FINDINGS: Tricompartmental degenerative changes are identified most marked in the lateral compartment with significant loss of joint space. No fractures are noted. There is a small joint effusion and anterior soft tissue swelling. Vascular calcifications are noted. IMPRESSION: 1. Tricompartmental degenerative changes are identified most marked in the lateral compartment with significant loss of joint space. 2. Small joint effusion. 3. Vascular calcifications. Electronically Signed   By: Dorise Bullion III M.D.   On: 01/16/2022 09:42    Procedures Procedures    Medications Ordered in ED Medications  oxyCODONE-acetaminophen (PERCOCET/ROXICET) 5-325 MG per tablet 1 tablet (1 tablet Oral Given 01/16/22 1121)    ED Course/ Medical Decision Making/ A&P                           Medical Decision Making Amount and/or Complexity of Data Reviewed Labs: ordered. Radiology: ordered.  Risk Prescription drug management.   This patient presents to the ED with chief complaint(s) of dysuria and right knee pain with pertinent past medical history  of rheumatoid arthritis, hypertension, CKD stage III .The complaint involves an extensive differential diagnosis and also carries with it a high risk of complications and morbidity.    The differential diagnosis includes acute cystitis, pyelonephritis; traumatic injury to right knee including fracture, dislocation, joint effusion   The initial plan is to obtain baseline labs including UA and x-ray of R knee  Additional history obtained: Additional history obtained from  none Records reviewed  none  Initial Assessment:   Exam significant for suprapubic tenderness on palpation and bilateral flank pain on palpation.  Skin is warm and dry.  Abdomen is soft and non-distended with normal bowel sounds.  Right knee without gross deformity or obvious injury, does have generalized tenderness to palpation.  Patient has generalized tenderness to both knees and lower extremity joints secondary to rheumatoid arthritis, but states the right knee is worse since she hit it on her walker.  Lungs clear to auscultation bilaterally.  Heart rate normal with regular rhythm.  She is alert and oriented.    Independent ECG/labs interpretation:  The following labs were independently interpreted:  Metabolic panel reveals no major electrolyte disturbances, mildly elevated Cr and BUN, patient does have history of CKD.  UA positive for nitrites, leukocytes, and many bacteria consistent with acute cystitis.  CBC reveals no evidence of leukocytosis or anemia.   Independent visualization and interpretation of imaging: I independently visualized the following imaging with scope of interpretation limited to determining acute life threatening conditions related to emergency care: right knee x-ray, which revealed small joint effusion with degenerative changes, more pronounced in the lateral compartment.  I agree with radiologist interpretation.  Treatment and Reassessment: Patient takes 10 mg oxycodone every 4 hours for rheumatoid  arthritis pain, was requesting something for pain while in ED.  Patient was given 5 mg Percocet with improvement in her symptoms.  Disposition:   The patient has been appropriately medically screened and/or stabilized in the  ED. I have low suspicion for any other emergent medical condition which would require further screening, evaluation or treatment in the ED or require inpatient management. At time of discharge the patient is hemodynamically stable and in no acute distress. I have discussed work-up results and diagnosis with patient and answered all questions. Patient is agreeable with discharge plan. We discussed strict return precautions for returning to the emergency department and they verbalized understanding.  Will send prescription to patient's pharmacy to treat UTI.  Patient also requested prescription for pain for her rheumatoid arthritis due to no longer being accepted at her pain specialist.  Discussed with patient options for pain control including '800mg'$  ibuprofen which she asked to be prescribed.            Final Clinical Impression(s) / ED Diagnoses Final diagnoses:  Acute cystitis without hematuria    Rx / DC Orders ED Discharge Orders          Ordered    cephALEXin (KEFLEX) 500 MG capsule  4 times daily        01/16/22 1423    ibuprofen (ADVIL) 800 MG tablet  Every 6 hours PRN        01/16/22 1423              Pat Kocher, [place] 01/16/22 1432    Valarie Merino, MD 01/17/22 [phone]

## 2022-01-22 ENCOUNTER — Emergency Department (HOSPITAL_COMMUNITY)
Admission: EM | Admit: 2022-01-22 | Discharge: 2022-01-22 | Disposition: A | Discharge status: Home / Self Care | Payer: 59 | Attending: Emergency Medicine | Admitting: Emergency Medicine

## 2022-01-22 ENCOUNTER — Other Ambulatory Visit: Payer: Self-pay

## 2022-01-22 ENCOUNTER — Emergency Department (HOSPITAL_COMMUNITY): Payer: 59

## 2022-01-22 ENCOUNTER — Encounter (HOSPITAL_COMMUNITY): Payer: Self-pay

## 2022-01-22 DIAGNOSIS — M25561 Pain in right knee: Secondary | ICD-10-CM | POA: Insufficient documentation

## 2022-01-22 DIAGNOSIS — R0781 Pleurodynia: Secondary | ICD-10-CM | POA: Insufficient documentation

## 2022-01-22 DIAGNOSIS — M7918 Myalgia, other site: Secondary | ICD-10-CM | POA: Diagnosis present

## 2022-01-22 DIAGNOSIS — R519 Headache, unspecified: Secondary | ICD-10-CM | POA: Diagnosis not present

## 2022-01-22 DIAGNOSIS — W19XXXA Unspecified fall, initial encounter: Secondary | ICD-10-CM | POA: Diagnosis not present

## 2022-01-22 MED ORDER — ACETAMINOPHEN 500 MG PO TABS
1000.0000 mg | ORAL_TABLET | Freq: Once | ORAL | Status: AC
  Administered 2022-01-22: 1000 mg via ORAL
  Filled 2022-01-22: qty 2

## 2022-01-22 NOTE — ED Notes (Signed)
In report I was advised that pt lives at [facility] but does not have her keys (she reportedly left them with ems) called [name] to see about them helping her gain access to her home.  No answer at this time.   [phone]

## 2022-01-22 NOTE — ED Triage Notes (Signed)
Pt BIB EMS from [facility] for generalized pain. Pt was found sleeping in her bathroom in electric chair.

## 2022-01-22 NOTE — ED Provider Notes (Signed)
[facility] Emergency Department Provider Note MRN:  [id number]  Arrival date & time: 01/22/22     Chief Complaint   Generalized Body Aches   History of Present Illness   [name] is a 67 y.o. year-old female presents to the ED with chief complaint of pain in head, right ribs, and right knee.  She is from [facility] living.  She says she had a fall a few days ago.  She also complains of chronic pain in her knees from arthritis.  History provided by patient.   Review of Systems  Pertinent positive and negative review of systems noted in HPI.    Physical Exam   Vitals:   01/22/22 0300  BP: (!) 155/80  Pulse: 60  Resp: 18  Temp: 98.5 F (36.9 C)  SpO2: 100%    CONSTITUTIONAL:  non toxic-appearing, NAD NEURO:  Alert and oriented x 3, CN 3-12 grossly intact EYES:  eyes equal and reactive ENT/NECK:  Supple, no stridor  CARDIO:  normal rate, regular rhythm, appears well-perfused  PULM:  No respiratory distress, CTAB GI/GU:  non-distended,  MSK/SPINE:  No gross deformities, no edema, moves all extremities  SKIN:  no rash, atraumatic   *Additional and/or pertinent findings included in MDM below  Diagnostic and Interventional Summary    EKG Interpretation  Date/Time:    Ventricular Rate:    PR Interval:    QRS Duration:   QT Interval:    QTC Calculation:   R Axis:     Text Interpretation:         Labs Reviewed - No data to display  CT HEAD WO CONTRAST (5MM)  Final Result    CT Cervical Spine Wo Contrast  Final Result    DG Ribs Unilateral W/Chest Right  Final Result    DG Knee Complete 4 Views Right  Final Result      Medications  acetaminophen (TYLENOL) tablet 1,000 mg (has no administration in time range)     Procedures  /  Critical Care Procedures  ED Course and Medical Decision Making  I have reviewed the triage vital signs, the nursing notes, and pertinent available records from the EMR.  Social  Determinants Affecting Complexity of Care: Patient has no clinically significant social determinants affecting this chief complaint..   ED Course:    Medical Decision Making Patient here from rehab center with pain in her head, neck, right ribs, and right knee.  She states that she had a fall a few days ago.  She has not been evaluated for this.  On my exam, she is sleeping during the exam.  When I wake her up she begins moaning and writhing in pain, but then falls back to sleep when I stop talking to her.  I do not see any obvious deformity on physical exam.  Will check imaging since she has not been evaluated for this recent reported fall.  I have a low degree of suspicion for acute traumatic injury.  5:20 AM Imaging is negative for acute traumatic findings.  I suspect symptoms are an exacerbation of patient's chronic pain.  Will give a dose of Tylenol and release from ED.  Amount and/or Complexity of Data Reviewed Radiology: ordered.    Details: No skull fx or ICH No visible rib fx   Risk OTC drugs.     Consultants: No consultations were needed in caring for this patient.   Treatment and Plan: Emergency department workup does not suggest an  emergent condition requiring admission or immediate intervention beyond  what has been performed at this time. The patient is safe for discharge and has  been instructed to return immediately for worsening symptoms, change in  symptoms or any other concerns    Final Clinical Impressions(s) / ED Diagnoses     ICD-10-CM   1. Arthralgia of right knee  M25.561     2. Rib pain  R07.81     3. Fall, initial encounter  W19.[facility]       ED Discharge Orders     None         Discharge Instructions Discussed with and Provided to Patient:   Discharge Instructions   None      Montine Circle, PA-C 01/22/22 [place], [place], DO 01/23/22 [phone]

## 2022-01-22 NOTE — ED Notes (Signed)
Call back to [place] to see about them helping with access to her home.  No answer at this time.

## 2022-01-22 NOTE — ED Notes (Signed)
Pt states that maintenance can get her into her apartment.  [facility] called for transport back

## 2022-01-22 NOTE — ED Notes (Signed)
Checked pt room without success, no keys visualized.

## 2022-01-22 NOTE — ED Notes (Signed)
EMS tells me that EMS report states that keys were left with pt in ED

## 2022-01-22 NOTE — ED Notes (Signed)
Call to [employer] office to report that pt feels that her keys were left with EMS
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
## 2022-01-26 ENCOUNTER — Encounter: Payer: Medicare Other | Admitting: Neurosurgery

## 2022-01-26 ENCOUNTER — Other Ambulatory Visit: Payer: Self-pay

## 2022-01-26 DIAGNOSIS — Z981 Arthrodesis status: Secondary | ICD-10-CM

## 2022-01-28 ENCOUNTER — Other Ambulatory Visit: Payer: Self-pay

## 2022-01-28 ENCOUNTER — Other Ambulatory Visit (HOSPITAL_BASED_OUTPATIENT_CLINIC_OR_DEPARTMENT_OTHER): Payer: Self-pay

## 2022-01-28 ENCOUNTER — Encounter (HOSPITAL_BASED_OUTPATIENT_CLINIC_OR_DEPARTMENT_OTHER): Payer: Self-pay

## 2022-01-28 ENCOUNTER — Emergency Department (HOSPITAL_BASED_OUTPATIENT_CLINIC_OR_DEPARTMENT_OTHER): Payer: 59 | Admitting: Radiology

## 2022-01-28 ENCOUNTER — Emergency Department (HOSPITAL_BASED_OUTPATIENT_CLINIC_OR_DEPARTMENT_OTHER)
Admission: EM | Admit: 2022-01-28 | Discharge: 2022-01-28 | Disposition: A | Discharge status: Home / Self Care | Payer: 59 | Attending: Emergency Medicine | Admitting: Emergency Medicine

## 2022-01-28 DIAGNOSIS — M25561 Pain in right knee: Secondary | ICD-10-CM | POA: Insufficient documentation

## 2022-01-28 DIAGNOSIS — F17200 Nicotine dependence, unspecified, uncomplicated: Secondary | ICD-10-CM | POA: Diagnosis not present

## 2022-01-28 DIAGNOSIS — I129 Hypertensive chronic kidney disease with stage 1 through stage 4 chronic kidney disease, or unspecified chronic kidney disease: Secondary | ICD-10-CM | POA: Insufficient documentation

## 2022-01-28 DIAGNOSIS — Z79899 Other long term (current) drug therapy: Secondary | ICD-10-CM | POA: Diagnosis not present

## 2022-01-28 DIAGNOSIS — G8929 Other chronic pain: Secondary | ICD-10-CM

## 2022-01-28 DIAGNOSIS — N189 Chronic kidney disease, unspecified: Secondary | ICD-10-CM | POA: Insufficient documentation

## 2022-01-28 DIAGNOSIS — M25512 Pain in left shoulder: Secondary | ICD-10-CM

## 2022-01-28 MED ORDER — HYDROMORPHONE HCL 1 MG/ML IJ SOLN
2.0000 mg | Freq: Once | INTRAMUSCULAR | Status: AC
  Administered 2022-01-28: 2 mg via INTRAMUSCULAR
  Filled 2022-01-28: qty 2

## 2022-01-28 MED ORDER — PREDNISONE 10 MG PO TABS
40.0000 mg | ORAL_TABLET | Freq: Every day | ORAL | 0 refills | Status: DC
  Filled 2022-01-28: qty 20, 5d supply, fill #0

## 2022-01-28 NOTE — ED Provider Notes (Signed)
[place] [facility] EMERGENCY DEPT Provider Note   CSN: [id number] Arrival date & time: 01/28/22  1221     History  Chief Complaint  Patient presents with   Leg Pain    [name] is a 67 y.o. female.  Patient brought in by family member from [facility].  Patient seen at [facility] on January 12 for right knee pain and a history of several falls.  At that time she was also complaining of pain in the head right ribs and right knee.  X-rays at that time showed significant inflammatory arthritis of the right knee.  Patient is followed by chronic pain specialist.  She states that she got dropped recently.  Patient is here today with stating that she has significant pain in her right knee and cannot straighten it out.  Also a complaint of left shoulder pain.  States that she has fallen 3-3 times in the past week due to the pain.  Patient denies any other injuries.  Past medical history significant for chronic pain rheumatoid arthritis hypertension chronic kidney disease.  Past surgical history significant for abdominal hysterectomy laparoscopic appendectomy anterior cervical decompressive discectomy in October 2023.  Patient is an everyday smoker.  And as mentioned patient has a history of chronic pain and followed by pain management.  It sounds as if they dropped her just recently due to not sticking to the contract.       Home Medications Prior to Admission medications   Medication Sig Start Date End Date Taking? Authorizing Provider  albuterol (VENTOLIN HFA) 108 (90 Base) MCG/ACT inhaler Inhale 1-2 puffs into the lungs every 6 (six) hours as needed for wheezing or shortness of breath. 11/24/20   Kathie Dike, MD  amitriptyline (ELAVIL) 50 MG tablet Take 50 mg by mouth at bedtime. 09/17/21   [provider]  amLODipine (NORVASC) 10 MG tablet Take 10 mg by mouth every morning. 03/12/19   [provider]  Celedonio Miyamoto 62.5-25 MCG/ACT  AEPB Inhale 1 puff into the lungs daily. 09/17/21   [provider]  atorvastatin (LIPITOR) 40 MG tablet Take 1 tablet (40 mg total) by mouth daily at 6 PM. Patient taking differently: Take 40 mg by mouth at bedtime. 08/22/14   Rai, Vernelle Emerald, MD  baclofen (LIORESAL) 10 MG tablet Take 10 mg by mouth 3 (three) times daily as needed for muscle spasms. 05/30/21   [provider]  celecoxib (CELEBREX) 200 MG capsule Take 1 capsule (200 mg total) by mouth every 12 (twelve) hours. 11/05/21   Meade Maw, MD  cephALEXin (KEFLEX) 500 MG capsule Take 1 capsule (500 mg total) by mouth 4 (four) times daily. 01/16/22   Clark, Meghan R, PA  diclofenac Sodium (VOLTAREN) 1 % GEL Apply 2 g topically 4 (four) times daily as needed (joint pain). 07/18/21   Lacinda Axon, MD  DULoxetine (CYMBALTA) 30 MG capsule Take 30 mg by mouth daily. 03/05/21   [provider]  fluticasone (FLONASE) 50 MCG/ACT nasal spray Place 1 spray into both nostrils daily as needed for allergies. 07/13/21   [provider]  gabapentin (NEURONTIN) 400 MG capsule Take 400-800 mg by mouth in the morning, at noon, and at bedtime. 400 mg am, 400 mg afternoon, 800 mg at bedtime 03/26/21   [provider]  ibuprofen (ADVIL) 800 MG tablet Take 1 tablet (800 mg total) by mouth every 6 (six) hours as needed. 01/16/22   Theressa Stamps R, PA  lidocaine (LIDODERM)  5 % Place 1 patch onto the skin daily as needed. Remove & Discard patch within 12 hours or as directed by MD 07/02/21   Jeanell Sparrow, DO  linaclotide (LINZESS) 145 MCG CAPS capsule Take 145 mcg by mouth daily as needed.    [provider]  methylPREDNISolone (MEDROL DOSEPAK) 4 MG TBPK tablet Day 1: '8mg'$  before breakfast, 4 mg after lunch, 4 mg after supper, and 8 mg at bedtime Day 2: 4 mg before breakfast, 4 mg after lunch, 4 mg  after supper, and 8 mg  at bedtime Day 3:  4 mg  before breakfast, 4 mg  after lunch, 4 mg after supper, and 4 mg  at  bedtime Day 4: 4 mg  before breakfast, 4 mg  after lunch, and 4 mg at bedtime Day 5: 4 mg  before breakfast and 4 mg at bedtime Day 6: 4 mg  before breakfast 11/15/21   Deno Etienne, DO  naloxone [facility]) nasal spray 4 mg/0.1 mL Place 1 spray into the nose once. As needed for overdose    [provider]  oxyCODONE-acetaminophen (PERCOCET) 10-325 MG tablet Take 1 tablet by mouth every 4 (four) hours as needed for pain.    [provider]  pantoprazole (PROTONIX) 40 MG tablet Take 40 mg by mouth daily.    [provider]      Allergies    Iodine-131, Iohexol, Levofloxacin, Zofran [ondansetron hcl], Heparin, and Morphine and related    Review of Systems   Review of Systems  Constitutional:  Negative for chills and fever.  HENT:  Negative for ear pain and sore throat.   Eyes:  Negative for pain and visual disturbance.  Respiratory:  Negative for cough and shortness of breath.   Cardiovascular:  Negative for chest pain and palpitations.  Gastrointestinal:  Negative for abdominal pain and vomiting.  Genitourinary:  Negative for dysuria and hematuria.  Musculoskeletal:  Positive for arthralgias and joint swelling. Negative for back pain.  Skin:  Negative for color change and rash.  Neurological:  Negative for seizures and syncope.  All other systems reviewed and are negative.   Physical Exam Updated Vital Signs BP (!) 157/95   Pulse 83   Temp 98 F (36.7 C)   Resp 20   Ht 1.689 m (5' 6.5")   Wt 79.4 kg   SpO2 99%   BMI 27.83 kg/m  Physical Exam Vitals and nursing note reviewed.  Constitutional:      General: She is not in acute distress.    Appearance: Normal appearance. She is well-developed.  HENT:     Head: Normocephalic and atraumatic.     Mouth/Throat:     Mouth: Mucous membranes are moist.  Eyes:     Extraocular Movements: Extraocular movements intact.     Conjunctiva/sclera: Conjunctivae normal.     Pupils: Pupils are equal, round, and reactive  to light.  Cardiovascular:     Rate and Rhythm: Normal rate and regular rhythm.     Heart sounds: No murmur heard. Pulmonary:     Effort: Pulmonary effort is normal. No respiratory distress.     Breath sounds: Normal breath sounds.  Abdominal:     Palpations: Abdomen is soft.     Tenderness: There is no abdominal tenderness.  Musculoskeletal:        General: Swelling present.     Cervical back: Normal range of motion and neck supple. No rigidity.     Comments: Marked swelling some redness  and increased warmth to the right knee.  No obvious deformity.  Not able to straighten it out secondary to pain.  Does not appear to be LOC.  Distally neurovascularly intact.  Left lower extremity without any significant swelling.  Tenderness to palpation left shoulder without obvious deformity.  Distally neurovascular intact radial pulses 2+.  Skin:    General: Skin is warm and dry.     Capillary Refill: Capillary refill takes less than 2 seconds.  Neurological:     General: No focal deficit present.     Mental Status: She is alert and oriented to person, place, and time.  Psychiatric:        Mood and Affect: Mood normal.     ED Results / Procedures / Treatments   Labs (all labs ordered are listed, but only abnormal results are displayed) Labs Reviewed - No data to display  EKG EKG Interpretation  Date/Time:  Thursday January 28 2022 12:32:25 EST Ventricular Rate:  74 PR Interval:  128 QRS Duration: 94 QT Interval:  338 QTC Calculation: 375 R Axis:   -33 Text Interpretation: Normal sinus rhythm with sinus arrhythmia Left axis deviation Septal infarct , age undetermined Abnormal ECG When compared with ECG of 17-Nov-2021 19:49, PREVIOUS ECG IS PRESENT Confirmed by Fredia Sorrow [phone]) on 01/28/2022 1:36:25 PM  Radiology No results found.  Procedures Procedures    Medications Ordered in ED Medications  HYDROmorphone (DILAUDID) injection 2 mg (2 mg Intramuscular Given 01/28/22 1449)     ED Course/ Medical Decision Making/ A&P                             Medical Decision Making Amount and/or Complexity of Data Reviewed Radiology: ordered.  Risk Prescription drug management.   Patient seen at [facility] in January 12 for similar findings.  Had x-ray of the knee at that time.  The left shoulder pain is a new complaint.  So we will get x-rays of that today.  Will also go ahead and x-ray the right knee again since there have been some history of falls.  But this seems to be an inflammatory arthritis in that knee.  Will give an IM shot of hydromorphone.  [facility] living [facility].   Final Clinical Impression(s) / ED Diagnoses Final diagnoses:  Chronic pain of right knee  Acute pain of left shoulder    Rx / DC Orders ED Discharge Orders     None         Fredia Sorrow, MD 01/28/22 1501

## 2022-01-28 NOTE — ED Triage Notes (Signed)
Patient here POV from Home.  Endorses Bilateral Leg Pain that began 1 Week ago. No Trauma or Injury but 3 Falls this Past Week due to the Pain. Pain is entire Right leg and somewhat in Left Leg but localized to Left Knee as well.   NAD Noted during Triage. A&Ox4. GCS 15. Ambulatory.

## 2022-01-28 NOTE — Discharge Instructions (Addendum)
Take the prednisone as directed.  Make an appointment to follow-up with [facility].  For further evaluation of the inflammatory arthritis in the right knee.  The knee and shoulder x-ray were normal

## 2022-01-28 NOTE — ED Notes (Signed)
Pt with complaints of "joint pain" all over.  Shoulder pain, unable to stand un assisted d/t pain.

## 2022-02-10 ENCOUNTER — Other Ambulatory Visit: Payer: Self-pay | Admitting: Student

## 2022-02-10 DIAGNOSIS — N390 Urinary tract infection, site not specified: Secondary | ICD-10-CM

## 2022-02-10 DIAGNOSIS — R102 Pelvic and perineal pain: Secondary | ICD-10-CM

## 2022-02-20 ENCOUNTER — Emergency Department (HOSPITAL_COMMUNITY)
Admission: EM | Admit: 2022-02-20 | Discharge: 2022-02-20 | Disposition: A | Discharge status: Home / Self Care | Payer: 59 | Attending: Emergency Medicine | Admitting: Emergency Medicine

## 2022-02-20 ENCOUNTER — Emergency Department (HOSPITAL_COMMUNITY): Payer: 59

## 2022-02-20 ENCOUNTER — Encounter (HOSPITAL_COMMUNITY): Payer: Self-pay

## 2022-02-20 DIAGNOSIS — N189 Chronic kidney disease, unspecified: Secondary | ICD-10-CM | POA: Insufficient documentation

## 2022-02-20 DIAGNOSIS — M545 Low back pain, unspecified: Secondary | ICD-10-CM | POA: Diagnosis not present

## 2022-02-20 DIAGNOSIS — Z7951 Long term (current) use of inhaled steroids: Secondary | ICD-10-CM | POA: Insufficient documentation

## 2022-02-20 DIAGNOSIS — J449 Chronic obstructive pulmonary disease, unspecified: Secondary | ICD-10-CM | POA: Diagnosis not present

## 2022-02-20 DIAGNOSIS — M25561 Pain in right knee: Secondary | ICD-10-CM | POA: Insufficient documentation

## 2022-02-20 DIAGNOSIS — M25511 Pain in right shoulder: Secondary | ICD-10-CM | POA: Diagnosis not present

## 2022-02-20 DIAGNOSIS — M25512 Pain in left shoulder: Secondary | ICD-10-CM | POA: Insufficient documentation

## 2022-02-20 DIAGNOSIS — S0990XA Unspecified injury of head, initial encounter: Secondary | ICD-10-CM | POA: Insufficient documentation

## 2022-02-20 DIAGNOSIS — Z79899 Other long term (current) drug therapy: Secondary | ICD-10-CM | POA: Diagnosis not present

## 2022-02-20 DIAGNOSIS — W19XXXA Unspecified fall, initial encounter: Secondary | ICD-10-CM | POA: Insufficient documentation

## 2022-02-20 DIAGNOSIS — I129 Hypertensive chronic kidney disease with stage 1 through stage 4 chronic kidney disease, or unspecified chronic kidney disease: Secondary | ICD-10-CM | POA: Insufficient documentation

## 2022-02-20 DIAGNOSIS — E878 Other disorders of electrolyte and fluid balance, not elsewhere classified: Secondary | ICD-10-CM | POA: Insufficient documentation

## 2022-02-20 DIAGNOSIS — M542 Cervicalgia: Secondary | ICD-10-CM | POA: Insufficient documentation

## 2022-02-20 DIAGNOSIS — M1990 Unspecified osteoarthritis, unspecified site: Secondary | ICD-10-CM

## 2022-02-20 DIAGNOSIS — R7989 Other specified abnormal findings of blood chemistry: Secondary | ICD-10-CM

## 2022-02-20 DIAGNOSIS — M1380 Other specified arthritis, unspecified site: Secondary | ICD-10-CM | POA: Insufficient documentation

## 2022-02-20 LAB — BLOOD GAS, VENOUS
Acid-base deficit: 5.2 mmol/L — ABNORMAL HIGH (ref 0.0–2.0)
Bicarbonate: 20 mmol/L (ref 20.0–28.0)
O2 Saturation: 87.8 %
Patient temperature: 37
pCO2, Ven: 37 mmHg — ABNORMAL LOW (ref 44–60)
pH, Ven: 7.34 (ref 7.25–7.43)
pO2, Ven: 56 mmHg — ABNORMAL HIGH (ref 32–45)

## 2022-02-20 LAB — BASIC METABOLIC PANEL
Anion gap: 9 (ref 5–15)
BUN: 19 mg/dL (ref 8–23)
CO2: 15 mmol/L — ABNORMAL LOW (ref 22–32)
Calcium: 9 mg/dL (ref 8.9–10.3)
Chloride: 113 mmol/L — ABNORMAL HIGH (ref 98–111)
Creatinine, Ser: 1.05 mg/dL — ABNORMAL HIGH (ref 0.44–1.00)
GFR, Estimated: 58 mL/min — ABNORMAL LOW (ref >60)
Glucose, Bld: 83 mg/dL (ref 70–99)
Potassium: 4.2 mmol/L (ref 3.5–5.1)
Sodium: 137 mmol/L (ref 135–145)

## 2022-02-20 LAB — CBC
HCT: 42.1 % (ref 36.0–46.0)
Hemoglobin: 12.7 g/dL (ref 12.0–15.0)
MCH: 28.5 pg (ref 26.0–34.0)
MCHC: 30.2 g/dL (ref 30.0–36.0)
MCV: 94.6 fL (ref 80.0–100.0)
Platelets: 313 10*3/uL (ref 150–400)
RBC: 4.45 MIL/uL (ref 3.87–5.11)
RDW: 15.9 % — ABNORMAL HIGH (ref 11.5–15.5)
WBC: 9.5 10*3/uL (ref 4.0–10.5)
nRBC: 0 % (ref 0.0–0.2)

## 2022-02-20 MED ORDER — OXYCODONE-ACETAMINOPHEN 5-325 MG PO TABS: 1.0000 | ORAL_TABLET | Freq: Four times a day (QID) | ORAL | 0 refills | Status: DC | PRN

## 2022-02-20 MED ORDER — HYDROMORPHONE HCL 1 MG/ML IJ SOLN
0.5000 mg | Freq: Once | INTRAMUSCULAR | Status: AC
  Administered 2022-02-20: 0.5 mg via INTRAMUSCULAR
  Filled 2022-02-20: qty 1

## 2022-02-20 MED ORDER — SODIUM CHLORIDE 0.9 % IV BOLUS
500.0000 mL | Freq: Once | INTRAVENOUS | Status: AC
  Administered 2022-02-20: 500 mL via INTRAVENOUS

## 2022-02-20 NOTE — ED Notes (Signed)
Pt provided with a [place] sandwich and ginger ale

## 2022-02-20 NOTE — Discharge Instructions (Addendum)
The x-rays did not show any signs of fracture or dislocation.  Follow-up with your rheumatologist and orthopedic doctor.  Take the medications as needed for pain.

## 2022-02-20 NOTE — ED Notes (Signed)
[facility] notified for need of pt transport

## 2022-02-20 NOTE — ED Provider Notes (Signed)
[facility] AT [facility] Provider Note   CSN: [id number] Arrival date & time: 02/20/22  1542     History  Chief Complaint  Patient presents with   Joint Pain   Cough    [name] is a 67 y.o. female.   Cough    Patient has a history of COPD, hypertension, rheumatoid arthritis, reflux, chronic kidney disease, cervical myelopathy, spinal stenosis chronic joint pain.  Patient states she had a fall last evening.  She was able to get up on her own.  Patient normally uses a walker as well as a wheelchair and a motorized wheelchair to help her get around at times.  Patient states since that fall she has had increasing pain in her right knee.  She is also having pain in both of her shoulders.  She denies any fevers or chills.  She does think she hit her head.  She is not sure if she lost consciousness.  She is also having some pain in her neck and back.  Patient called the ambulance to be brought to the ED because she has noticed a swelling in her right knee that is new.  Home Medications Prior to Admission medications   Medication Sig Start Date End Date Taking? Authorizing Provider  oxyCODONE-acetaminophen (PERCOCET/ROXICET) 5-325 MG tablet Take 1 tablet by mouth every 6 (six) hours as needed for severe pain. 02/20/22  Yes Dorie Rank, MD  albuterol (VENTOLIN HFA) 108 (90 Base) MCG/ACT inhaler Inhale 1-2 puffs into the lungs every 6 (six) hours as needed for wheezing or shortness of breath. 11/24/20   Kathie Dike, MD  amitriptyline (ELAVIL) 50 MG tablet Take 50 mg by mouth at bedtime. 09/17/21   [provider]  amLODipine (NORVASC) 10 MG tablet Take 10 mg by mouth every morning. 03/12/19   [provider]  Celedonio Miyamoto 62.5-25 MCG/ACT AEPB Inhale 1 puff into the lungs daily. 09/17/21   [provider]  atorvastatin (LIPITOR) 40 MG tablet Take 1 tablet (40 mg total) by mouth daily at 6 PM. Patient taking differently: Take 40 mg  by mouth at bedtime. 08/22/14   Rai, Vernelle Emerald, MD  baclofen (LIORESAL) 10 MG tablet Take 10 mg by mouth 3 (three) times daily as needed for muscle spasms. 05/30/21   [provider]  celecoxib (CELEBREX) 200 MG capsule Take 1 capsule (200 mg total) by mouth every 12 (twelve) hours. 11/05/21   Meade Maw, MD  cephALEXin (KEFLEX) 500 MG capsule Take 1 capsule (500 mg total) by mouth 4 (four) times daily. 01/16/22   Clark, Meghan R, PA  diclofenac Sodium (VOLTAREN) 1 % GEL Apply 2 g topically 4 (four) times daily as needed (joint pain). 07/18/21   Lacinda Axon, MD  DULoxetine (CYMBALTA) 30 MG capsule Take 30 mg by mouth daily. 03/05/21   [provider]  fluticasone (FLONASE) 50 MCG/ACT nasal spray Place 1 spray into both nostrils daily as needed for allergies. 07/13/21   [provider]  gabapentin (NEURONTIN) 400 MG capsule Take 400-800 mg by mouth in the morning, at noon, and at bedtime. 400 mg am, 400 mg afternoon, 800 mg at bedtime 03/26/21   [provider]  ibuprofen (ADVIL) 800 MG tablet Take 1 tablet (800 mg total) by mouth every 6 (six) hours as needed. 01/16/22   Clark, Meghan R, PA  lidocaine (LIDODERM) 5 % Place 1 patch onto the skin daily as needed. Remove & Discard patch within 12 hours or  as directed by MD 07/02/21   Jeanell Sparrow, DO  linaclotide Rolan Lipa) 145 MCG CAPS capsule Take 145 mcg by mouth daily as needed.    [provider]  methylPREDNISolone (MEDROL DOSEPAK) 4 MG TBPK tablet Day 1: 64m before breakfast, 4 mg after lunch, 4 mg after supper, and 8 mg at bedtime Day 2: 4 mg before breakfast, 4 mg after lunch, 4 mg  after supper, and 8 mg  at bedtime Day 3:  4 mg  before breakfast, 4 mg  after lunch, 4 mg after supper, and 4 mg  at bedtime Day 4: 4 mg  before breakfast, 4 mg  after lunch, and 4 mg at bedtime Day 5: 4 mg  before breakfast and 4 mg at bedtime Day 6: 4 mg  before breakfast 11/15/21   FDeno Etienne DO  naloxone ([facility]  nasal spray 4 mg/0.1 mL Place 1 spray into the nose once. As needed for overdose    [provider]  pantoprazole (PROTONIX) 40 MG tablet Take 40 mg by mouth daily.    [provider]  predniSONE (DELTASONE) 10 MG tablet Take 4 tablets (40 mg total) by mouth daily. 01/28/22   ZFredia Sorrow MD      Allergies    Iodine-131, Iohexol, Levofloxacin, Zofran [ondansetron hcl], Heparin, and Morphine and related    Review of Systems   Review of Systems  Respiratory:  Positive for cough.     Physical Exam Updated Vital Signs BP 122/83 (BP Location: Left Arm)   Pulse 75   Temp 97.8 F (36.6 C) (Oral)   Resp 20   SpO2 97%  Physical Exam Vitals and nursing note reviewed.  Constitutional:      General: She is not in acute distress.    Appearance: She is well-developed.  HENT:     Head: Normocephalic and atraumatic.     Right Ear: External ear normal.     Left Ear: External ear normal.  Eyes:     General: No scleral icterus.       Right eye: No discharge.        Left eye: No discharge.     Conjunctiva/sclera: Conjunctivae normal.  Neck:     Trachea: No tracheal deviation.  Cardiovascular:     Rate and Rhythm: Normal rate and regular rhythm.  Pulmonary:     Effort: Pulmonary effort is normal. No respiratory distress.     Breath sounds: Normal breath sounds. No stridor. No wheezing or rales.  Abdominal:     General: Bowel sounds are normal. There is no distension.     Palpations: Abdomen is soft.     Tenderness: There is no abdominal tenderness. There is no guarding or rebound.  Musculoskeletal:        General: No deformity.     Right shoulder: Tenderness present. No swelling or deformity.     Left shoulder: Tenderness present. No swelling or deformity.     Cervical back: Neck supple. Tenderness present. No swelling or deformity.     Thoracic back: Tenderness present. No swelling or deformity.     Lumbar back: Tenderness present. No swelling or deformity.      Right knee: No effusion or erythema. Tenderness present.  Skin:    General: Skin is warm and dry.     Findings: No rash.  Neurological:     General: No focal deficit present.     Mental Status: She is alert.     Cranial Nerves: No  cranial nerve deficit, dysarthria or facial asymmetry.     Sensory: No sensory deficit.     Motor: No abnormal muscle tone or seizure activity.     Coordination: Coordination normal.  Psychiatric:        Mood and Affect: Mood normal.     ED Results / Procedures / Treatments   Labs (all labs ordered are listed, but only abnormal results are displayed) Labs Reviewed  CBC - Abnormal; Notable for the following components:      Result Value   RDW 15.9 (*)    All other components within normal limits  BASIC METABOLIC PANEL - Abnormal; Notable for the following components:   Chloride 113 (*)    CO2 15 (*)    Creatinine, Ser 1.05 (*)    GFR, Estimated 58 (*)    All other components within normal limits  BLOOD GAS, VENOUS - Abnormal; Notable for the following components:   pCO2, Ven 37 (*)    pO2, Ven 56 (*)    Acid-base deficit 5.2 (*)    All other components within normal limits    EKG EKG Interpretation  Date/Time:  Saturday February 20 2022 15:54:01 EST Ventricular Rate:  77 PR Interval:  120 QRS Duration: 85 QT Interval:  366 QTC Calculation: 415 R Axis:   5 Text Interpretation: Sinus arrhythmia Probable left atrial enlargement Probable anteroseptal infarct, old No significant change since last tracing Confirmed by Dorie Rank [phone]) on 02/20/2022 4:12:28 PM  Radiology CT Head Wo Contrast  Result Date: 02/20/2022 CLINICAL DATA:  Head trauma EXAM: CT HEAD WITHOUT CONTRAST TECHNIQUE: Contiguous axial images were obtained from the base of the skull through the vertex without intravenous contrast. RADIATION DOSE REDUCTION: This exam was performed according to the departmental dose-optimization program which includes automated exposure control,  adjustment of the mA and/or kV according to patient size and/or use of iterative reconstruction technique. COMPARISON:  Head CT 01/22/2022.  MRI brain 04/02/2021 FINDINGS: Brain: No evidence of acute infarction, hemorrhage, hydrocephalus, extra-axial collection or mass lesion/mass effect. Vascular: Atherosclerotic calcifications are present within the cavernous internal carotid arteries. Skull: Normal. Negative for fracture or focal lesion. Sinuses/Orbits: No acute finding. Other: None. IMPRESSION: No acute intracranial abnormality. Electronically Signed   By: Ronney Asters M.D.   On: 02/20/2022 17:15   CT Cervical Spine Wo Contrast  Result Date: 02/20/2022 CLINICAL DATA:  Trauma. EXAM: CT CERVICAL SPINE WITHOUT CONTRAST TECHNIQUE: Multidetector CT imaging of the cervical spine was performed without intravenous contrast. Multiplanar CT image reconstructions were also generated. RADIATION DOSE REDUCTION: This exam was performed according to the departmental dose-optimization program which includes automated exposure control, adjustment of the mA and/or kV according to patient size and/or use of iterative reconstruction technique. COMPARISON:  Cervical spine CT dated 01/22/2022. FINDINGS: Alignment: No acute subluxation. Skull base and vertebrae: No acute cervical spine fracture. Osteopenia. There is mild compression fracture of superior endplate of T2 which appears new since the prior CT concerning for an acute or subacute fracture. Soft tissues and spinal canal: No prevertebral fluid or swelling. No visible canal hematoma. Disc levels: Multilevel degenerative changes. C3-C4 and C5-C7 ACDF. Upper chest: Mild emphysema. Other: Bilateral carotid bulb calcified plaques. Indeterminate 16 mm high attenuating lesion in the anterior left 5 thyroid gland. Further initial evaluation with ultrasound on a nonemergent/outpatient basis recommended. IMPRESSION: 1. No acute/traumatic cervical spine pathology. 2. Mild acute  appearing compression fracture of superior endplate of T2. Electronically Signed   By: Laren Everts.D.  On: 02/20/2022 17:13   DG Knee 2 Views Right  Result Date: 02/20/2022 CLINICAL DATA:  Fall with pain EXAM: RIGHT KNEE - 1-2 VIEW COMPARISON:  01/28/2022 FINDINGS: No fracture or malalignment. Severe tricompartment arthritis of the knee, worst involving the patellofemoral and lateral tibiofemoral joint spaces. Prominent subarticular cyst formation, sclerosis and osteophyte. No sizable effusion. Vascular calcifications IMPRESSION: No acute osseous abnormality. Severe tricompartment arthritis of the knee. Electronically Signed   By: Donavan Foil M.D.   On: 02/20/2022 17:04   DG Shoulder Left  Result Date: 02/20/2022 CLINICAL DATA:  Fall with shoulder pain EXAM: LEFT SHOULDER - 2+ VIEW COMPARISON:  01/28/2022 FINDINGS: No fracture or malalignment.  AC joint appears intact. IMPRESSION: No acute osseous abnormality Electronically Signed   By: Donavan Foil M.D.   On: 02/20/2022 17:03   DG Shoulder Right  Result Date: 02/20/2022 CLINICAL DATA:  Fall with shoulder pain EXAM: RIGHT SHOULDER - 2+ VIEW COMPARISON:  07/06/2021 FINDINGS: Mild AC joint and glenohumeral degenerative change. No fracture or malalignment. IMPRESSION: Mild degenerative changes. No acute osseous abnormality. Electronically Signed   By: Donavan Foil M.D.   On: 02/20/2022 17:02   DG Chest 2 View  Result Date: 02/20/2022 CLINICAL DATA:  Fall with pain EXAM: CHEST - 2 VIEW COMPARISON:  01/22/2022 FINDINGS: Hardware in the cervical spine. No acute airspace disease or pleural effusion. Mild cardiomegaly with aortic atherosclerosis. No pneumothorax. Scoliosis of the spine. Hiatal hernia. IMPRESSION: No active cardiopulmonary disease. Mild cardiomegaly. Hiatal hernia. Electronically Signed   By: Donavan Foil M.D.   On: 02/20/2022 17:01   DG Thoracic Spine 2 View  Result Date: 02/20/2022 CLINICAL DATA:  Fall, pain. EXAM: THORACIC  SPINE 2 VIEWS COMPARISON:  CT chest 11/17/2021. FINDINGS: There is mild levoconvex curvature of the lower thoracic spine. Lateral views limited in the midthoracic levels secondary to technique. There is no acute fracture or dislocation identified. Cervical spinal fusion hardware is partially visualized. Soft tissues are within normal limits. IMPRESSION: 1. No acute findings. 2. Mild scoliosis. Electronically Signed   By: Ronney Asters M.D.   On: 02/20/2022 17:00   DG Lumbar Spine Complete  Result Date: 02/20/2022 CLINICAL DATA:  Fall with pain EXAM: LUMBAR SPINE - COMPLETE 4+ VIEW COMPARISON:  CT 01/09/2021 FINDINGS: Dextroscoliosis. Metallic densities at the posterior pelvis/sacrum. Vertebral body heights are grossly maintained. Multilevel degenerative changes, advanced at L3-L4, L4-L5 and L5-S1. Facet degenerative changes of the lower lumbar spine. Aortic atherosclerosis IMPRESSION: Scoliosis and degenerative changes. No acute osseous abnormality. Electronically Signed   By: Donavan Foil M.D.   On: 02/20/2022 17:00    Procedures Procedures    Medications Ordered in ED Medications  HYDROmorphone (DILAUDID) injection 0.5 mg (0.5 mg Intramuscular Given 02/20/22 1616)  sodium chloride 0.9 % bolus 500 mL (500 mLs Intravenous New Bag/Given 02/20/22 1757)    ED Course/ Medical Decision Making/ A&P Clinical Course as of 02/20/22 1805  Sat Feb 20, 2022  [id number] Basic metabolic panel(!) Bicarb decreased at 15.  No anion gap.  CBC normal. [JK]  1722 X-rays CT scan images reviewed.  No acute findings.  Severe arthritis noted in right knee [JK]  1759 Blood gas, venous (at [facility] and [facility])(!) Venous blood gas without signs of acidosis [JK]    Clinical Course User Index [JK] Dorie Rank, MD                             Medical Decision  Making Problems Addressed: Arthritis: chronic illness or injury with exacerbation, progression, or side effects of treatment Fall, initial encounter: acute illness or injury  that poses a threat to life or bodily functions Low serum bicarbonate: undiagnosed new problem with uncertain prognosis  Amount and/or Complexity of Data Reviewed Labs: ordered. Decision-making details documented in ED Course. Radiology: ordered and independent interpretation performed.  Risk Prescription drug management.   Patient presented to the ED for evaluation after a fall.  Patient has history of severe arthritis.  States at baseline she has to use a walker as well as wheelchair.  X-rays fortunately do not show any signs of fracture or dislocation but she does have severe arthritis in her right knee which is her primary area of discomfort.  PDMP reviewed.  Patient does take chronic opiates but last prescription was in December.  Will prescribe short course of pain medications.  Labs notable for decreased bicarb however she does not have any evidence of acidosis on her venous blood gas.  Will have patient follow-up with PCP.  Evaluation and diagnostic testing in the emergency department does not suggest an emergent condition requiring admission or immediate intervention beyond what has been performed at this time.  The patient is safe for discharge and has been instructed to return immediately for worsening symptoms, change in symptoms or any other concerns..        Final Clinical Impression(s) / ED Diagnoses Final diagnoses:  Arthritis  Fall, initial encounter  Low serum bicarbonate    Rx / DC Orders ED Discharge Orders          Ordered    oxyCODONE-acetaminophen (PERCOCET/ROXICET) 5-325 MG tablet  Every 6 hours PRN        02/20/22 1802              Dorie Rank, MD 02/20/22 1806

## 2022-02-20 NOTE — ED Triage Notes (Signed)
Pt from home arrives [facility] for chronic pain. Pt also states she has a cough for a few days.

## 2022-02-20 NOTE — ED Notes (Signed)
Patient taken out of ED via stretcher with all belongings via [facility].

## 2022-02-22 ENCOUNTER — Other Ambulatory Visit (HOSPITAL_BASED_OUTPATIENT_CLINIC_OR_DEPARTMENT_OTHER): Payer: Self-pay

## 2022-02-23 ENCOUNTER — Emergency Department (HOSPITAL_COMMUNITY)
Admission: EM | Admit: 2022-02-23 | Discharge: 2022-02-23 | Disposition: A | Discharge status: Home / Self Care | Payer: 59 | Attending: Emergency Medicine | Admitting: Emergency Medicine

## 2022-02-23 ENCOUNTER — Other Ambulatory Visit: Payer: Self-pay

## 2022-02-23 ENCOUNTER — Encounter (HOSPITAL_COMMUNITY): Payer: Self-pay | Admitting: Emergency Medicine

## 2022-02-23 DIAGNOSIS — N3001 Acute cystitis with hematuria: Secondary | ICD-10-CM | POA: Insufficient documentation

## 2022-02-23 DIAGNOSIS — J449 Chronic obstructive pulmonary disease, unspecified: Secondary | ICD-10-CM | POA: Diagnosis not present

## 2022-02-23 DIAGNOSIS — I10 Essential (primary) hypertension: Secondary | ICD-10-CM | POA: Diagnosis not present

## 2022-02-23 DIAGNOSIS — R300 Dysuria: Secondary | ICD-10-CM | POA: Diagnosis present

## 2022-02-23 DIAGNOSIS — Z79899 Other long term (current) drug therapy: Secondary | ICD-10-CM | POA: Insufficient documentation

## 2022-02-23 LAB — COMPREHENSIVE METABOLIC PANEL
ALT: 20 U/L (ref 0–44)
AST: 16 U/L (ref 15–41)
Albumin: 3.9 g/dL (ref 3.5–5.0)
Alkaline Phosphatase: 121 U/L (ref 38–126)
Anion gap: 9 (ref 5–15)
BUN: 28 mg/dL — ABNORMAL HIGH (ref 8–23)
CO2: 18 mmol/L — ABNORMAL LOW (ref 22–32)
Calcium: 9.2 mg/dL (ref 8.9–10.3)
Chloride: 113 mmol/L — ABNORMAL HIGH (ref 98–111)
Creatinine, Ser: 0.95 mg/dL (ref 0.44–1.00)
GFR, Estimated: >60 mL/min (ref >60)
Glucose, Bld: 93 mg/dL (ref 70–99)
Potassium: 4.1 mmol/L (ref 3.5–5.1)
Sodium: 140 mmol/L (ref 135–145)
Total Bilirubin: 0.7 mg/dL (ref 0.3–1.2)
Total Protein: 8.3 g/dL — ABNORMAL HIGH (ref 6.5–8.1)

## 2022-02-23 LAB — URINALYSIS, ROUTINE W REFLEX MICROSCOPIC
Bilirubin Urine: NEGATIVE
Glucose, UA: NEGATIVE mg/dL
Hgb urine dipstick: NEGATIVE
Ketones, ur: NEGATIVE mg/dL
Nitrite: NEGATIVE
Protein, ur: NEGATIVE mg/dL
Specific Gravity, Urine: 1.013 (ref 1.005–1.030)
pH: 5 (ref 5.0–8.0)

## 2022-02-23 LAB — CBC WITH DIFFERENTIAL/PLATELET
Abs Immature Granulocytes: 0.02 10*3/uL (ref 0.00–0.07)
Basophils Absolute: 0 10*3/uL (ref 0.0–0.1)
Basophils Relative: 1 %
Eosinophils Absolute: 0.7 10*3/uL — ABNORMAL HIGH (ref 0.0–0.5)
Eosinophils Relative: 8 %
HCT: 41.5 % (ref 36.0–46.0)
Hemoglobin: 13.1 g/dL (ref 12.0–15.0)
Immature Granulocytes: 0 %
Lymphocytes Relative: 29 %
Lymphs Abs: 2.3 10*3/uL (ref 0.7–4.0)
MCH: 28.2 pg (ref 26.0–34.0)
MCHC: 31.6 g/dL (ref 30.0–36.0)
MCV: 89.4 fL (ref 80.0–100.0)
Monocytes Absolute: 0.4 10*3/uL (ref 0.1–1.0)
Monocytes Relative: 5 %
Neutro Abs: 4.5 10*3/uL (ref 1.7–7.7)
Neutrophils Relative %: 57 %
Platelets: 332 10*3/uL (ref 150–400)
RBC: 4.64 MIL/uL (ref 3.87–5.11)
RDW: 15.9 % — ABNORMAL HIGH (ref 11.5–15.5)
WBC: 7.9 10*3/uL (ref 4.0–10.5)
nRBC: 0 % (ref 0.0–0.2)

## 2022-02-23 MED ORDER — SULFAMETHOXAZOLE-TRIMETHOPRIM 800-160 MG PO TABS: 1.0000 | ORAL_TABLET | Freq: Two times a day (BID) | ORAL | 0 refills | Status: AC

## 2022-02-23 MED ORDER — SODIUM CHLORIDE 0.9 % IV BOLUS
1000.0000 mL | Freq: Once | INTRAVENOUS | Status: AC
  Administered 2022-02-23: 1000 mL via INTRAVENOUS

## 2022-02-23 MED ORDER — SODIUM CHLORIDE 0.9 % IV SOLN
2.0000 g | Freq: Once | INTRAVENOUS | Status: AC
  Administered 2022-02-23: 2 g via INTRAVENOUS
  Filled 2022-02-23: qty 20

## 2022-02-23 NOTE — ED Notes (Signed)
Pt is up for d/c. Pt stated she can not walk or do not have family to pick her up. Contacted [facility] and gave report. Pt is now in queue.

## 2022-02-23 NOTE — Discharge Instructions (Signed)
Drink plenty of fluids and follow up with your md next week for recheck

## 2022-02-23 NOTE — ED Provider Notes (Signed)
[place] EMERGENCY DEPARTMENT AT [facility] Provider Note   CSN: [id number] Arrival date & time: 02/23/22  1502     History  Chief Complaint  Patient presents with   Dizziness   Urinary Tract Infection    [name] is a 67 y.o. female.  Patient complains of dysuria.  She was placed on Keflex without results.  Patient has history of COPD and hypertension.  The history is provided by the patient and medical records. No language interpreter was used.  Urinary Tract Infection Pain quality:  Aching Pain severity:  No pain Onset quality:  Sudden Timing:  Constant Progression:  Worsening Relieved by:  Nothing Worsened by:  Nothing Urinary symptoms: no discolored urine   Associated symptoms: no abdominal pain        Home Medications Prior to Admission medications   Medication Sig Start Date End Date Taking? Authorizing Provider  sulfamethoxazole-trimethoprim (BACTRIM DS) 800-160 MG tablet Take 1 tablet by mouth 2 (two) times daily for 7 days. 02/23/22 03/02/22 Yes Milton Ferguson, MD  albuterol (VENTOLIN HFA) 108 (90 Base) MCG/ACT inhaler Inhale 1-2 puffs into the lungs every 6 (six) hours as needed for wheezing or shortness of breath. 11/24/20   Kathie Dike, MD  amitriptyline (ELAVIL) 50 MG tablet Take 50 mg by mouth at bedtime. 09/17/21   [provider]  amLODipine (NORVASC) 10 MG tablet Take 10 mg by mouth every morning. 03/12/19   [provider]  Celedonio Miyamoto 62.5-25 MCG/ACT AEPB Inhale 1 puff into the lungs daily. 09/17/21   [provider]  atorvastatin (LIPITOR) 40 MG tablet Take 1 tablet (40 mg total) by mouth daily at 6 PM. Patient taking differently: Take 40 mg by mouth at bedtime. 08/22/14   Rai, Vernelle Emerald, MD  baclofen (LIORESAL) 10 MG tablet Take 10 mg by mouth 3 (three) times daily as needed for muscle spasms. 05/30/21   [provider]  celecoxib (CELEBREX) 200 MG capsule Take 1 capsule (200 mg total) by mouth  every 12 (twelve) hours. 11/05/21   Meade Maw, MD  cephALEXin (KEFLEX) 500 MG capsule Take 1 capsule (500 mg total) by mouth 4 (four) times daily. 01/16/22   Clark, Meghan R, PA  diclofenac Sodium (VOLTAREN) 1 % GEL Apply 2 g topically 4 (four) times daily as needed (joint pain). 07/18/21   Lacinda Axon, MD  DULoxetine (CYMBALTA) 30 MG capsule Take 30 mg by mouth daily. 03/05/21   [provider]  fluticasone (FLONASE) 50 MCG/ACT nasal spray Place 1 spray into both nostrils daily as needed for allergies. 07/13/21   [provider]  gabapentin (NEURONTIN) 400 MG capsule Take 400-800 mg by mouth in the morning, at noon, and at bedtime. 400 mg am, 400 mg afternoon, 800 mg at bedtime 03/26/21   [provider]  ibuprofen (ADVIL) 800 MG tablet Take 1 tablet (800 mg total) by mouth every 6 (six) hours as needed. 01/16/22   Clark, Meghan R, PA  lidocaine (LIDODERM) 5 % Place 1 patch onto the skin daily as needed. Remove & Discard patch within 12 hours or as directed by MD 07/02/21   Jeanell Sparrow, DO  linaclotide (LINZESS) 145 MCG CAPS capsule Take 145 mcg by mouth daily as needed.    [provider]  methylPREDNISolone (MEDROL DOSEPAK) 4 MG TBPK tablet Day 1: 67m before breakfast, 4 mg after lunch, 4 mg after supper, and 8 mg at bedtime Day 2: 4 mg before breakfast, 4 mg after  lunch, 4 mg  after supper, and 8 mg  at bedtime Day 3:  4 mg  before breakfast, 4 mg  after lunch, 4 mg after supper, and 4 mg  at bedtime Day 4: 4 mg  before breakfast, 4 mg  after lunch, and 4 mg at bedtime Day 5: 4 mg  before breakfast and 4 mg at bedtime Day 6: 4 mg  before breakfast 11/15/21   Deno Etienne, DO  naloxone [facility]) nasal spray 4 mg/0.1 mL Place 1 spray into the nose once. As needed for overdose    [provider]  oxyCODONE-acetaminophen (PERCOCET/ROXICET) 5-325 MG tablet Take 1 tablet by mouth every 6 (six) hours as needed for severe pain. 02/20/22   Dorie Rank, MD   pantoprazole (PROTONIX) 40 MG tablet Take 40 mg by mouth daily.    [provider]  predniSONE (DELTASONE) 10 MG tablet Take 4 tablets (40 mg total) by mouth daily. 01/28/22   Fredia Sorrow, MD      Allergies    Iodine-131, Iohexol, Levofloxacin, Zofran [ondansetron hcl], Heparin, and Morphine and related    Review of Systems   Review of Systems  Constitutional:  Negative for appetite change and fatigue.  HENT:  Negative for congestion, ear discharge and sinus pressure.   Eyes:  Negative for discharge.  Respiratory:  Negative for cough.   Cardiovascular:  Negative for chest pain.  Gastrointestinal:  Negative for abdominal pain and diarrhea.  Genitourinary:  Positive for dysuria. Negative for frequency and hematuria.  Musculoskeletal:  Negative for back pain.  Skin:  Negative for rash.  Neurological:  Negative for seizures and headaches.  Psychiatric/Behavioral:  Negative for hallucinations.     Physical Exam Updated Vital Signs BP (!) 134/99 (BP Location: Left Arm)   Pulse 80   Temp 97.9 F (36.6 C) (Oral)   Resp 17   SpO2 97%  Physical Exam Vitals and nursing note reviewed.  Constitutional:      Appearance: She is well-developed.  HENT:     Head: Normocephalic.     Nose: Nose normal.  Eyes:     General: No scleral icterus.    Conjunctiva/sclera: Conjunctivae normal.  Neck:     Thyroid: No thyromegaly.  Cardiovascular:     Rate and Rhythm: Normal rate and regular rhythm.     Heart sounds: No murmur heard.    No friction rub. No gallop.  Pulmonary:     Breath sounds: No stridor. No wheezing or rales.  Chest:     Chest wall: No tenderness.  Abdominal:     General: There is no distension.     Tenderness: There is no abdominal tenderness. There is no rebound.  Musculoskeletal:        General: Normal range of motion.     Cervical back: Neck supple.  Lymphadenopathy:     Cervical: No cervical adenopathy.  Skin:    Findings: No erythema or rash.   Neurological:     Mental Status: She is alert and oriented to person, place, and time.     Motor: No abnormal muscle tone.     Coordination: Coordination normal.  Psychiatric:        Behavior: Behavior normal.     ED Results / Procedures / Treatments   Labs (all labs ordered are listed, but only abnormal results are displayed) Labs Reviewed  CBC WITH DIFFERENTIAL/PLATELET - Abnormal; Notable for the following components:      Result Value   RDW 15.9 (*)  Eosinophils Absolute 0.7 (*)    All other components within normal limits  COMPREHENSIVE METABOLIC PANEL - Abnormal; Notable for the following components:   Chloride 113 (*)    CO2 18 (*)    BUN 28 (*)    Total Protein 8.3 (*)    All other components within normal limits  URINALYSIS, ROUTINE W REFLEX MICROSCOPIC - Abnormal; Notable for the following components:   Leukocytes,Ua SMALL (*)    Bacteria, UA RARE (*)    All other components within normal limits  URINE CULTURE    EKG None  Radiology No results found.  Procedures Procedures    Medications Ordered in ED Medications  cefTRIAXone (ROCEPHIN) 2 g in sodium chloride 0.9 % 100 mL IVPB (2 g Intravenous New Bag/Given 02/23/22 1721)  sodium chloride 0.9 % bolus 1,000 mL (1,000 mLs Intravenous New Bag/Given 02/23/22 1612)    ED Course/ Medical Decision Making/ A&P                             Medical Decision Making Amount and/or Complexity of Data Reviewed Labs: ordered. ECG/medicine tests: ordered.  Risk Prescription drug management.  This patient presents to the ED for concern of dysuria, this involves an extensive number of treatment options, and is a complaint that carries with it a high risk of complications and morbidity.  The differential diagnosis includes UTI, urethritis   Co morbidities that complicate the patient evaluation  COPD   Additional history obtained:  Additional history obtained from patient External records from outside  source obtained and reviewed including hospital records   Lab Tests:  I Ordered, and personally interpreted labs.  The pertinent results include: Urinalysis suggest UTI   Imaging Studies ordered: No imaging  Cardiac Monitoring: / EKG:  The patient was maintained on a cardiac monitor.  I personally viewed and interpreted the cardiac monitored which showed an underlying rhythm of: Normal sinus rhythm   Consultations Obtained:  No consultant  Problem List / ED Course / Critical interventions / Medication management  COPD and UTI I ordered medication including Rocephin for UTI Reevaluation of the patient after these medicines showed that the patient stayed the same I have reviewed the patients home medicines and have made adjustments as needed   Social Determinants of Health:  None   Test / Admission - Considered:  None  Patient with a urinary tract infection that was not cured by Keflex.  She will be placed on Bactrim and get a urine culture done and follow-up with PCP        Final Clinical Impression(s) / ED Diagnoses Final diagnoses:  Acute cystitis with hematuria    Rx / DC Orders ED Discharge Orders          Ordered    sulfamethoxazole-trimethoprim (BACTRIM DS) 800-160 MG tablet  2 times daily        02/23/22 1746              Milton Ferguson, MD 02/26/22 1004

## 2022-02-23 NOTE — ED Triage Notes (Signed)
Pt BIB EMS from home for UTI x2weeks, finished both prescribed ABX, denies N/V. Starting to feel "dizzy, about to pass out". A&Ox4.  BP 140/80 HR 92 RR 22 97% RA CBG 106  T 97.2

## 2022-02-23 NOTE — ED Notes (Signed)
Assisted pt with changing pull up

## 2022-02-25 LAB — URINE CULTURE: Culture: >=100000 — AB

## 2022-02-26 ENCOUNTER — Telehealth (HOSPITAL_BASED_OUTPATIENT_CLINIC_OR_DEPARTMENT_OTHER): Payer: Self-pay | Admitting: *Deleted

## 2022-02-26 NOTE — Progress Notes (Signed)
ED Antimicrobial Stewardship Positive Culture Follow Up   [name] is an 67 y.o. female who presented to [facility] on 02/23/2022 with a chief complaint of  Chief Complaint  Patient presents with   Dizziness   Urinary Tract Infection    Recent Results (from the past 720 hour(s))  Urine Culture (for pregnant, neutropenic or urologic patients or patients with an indwelling urinary catheter)     Status: Abnormal   Collection Time: 02/23/22  3:49 PM   Specimen: Urine, Clean Catch  Result Value Ref Range Status   Specimen Description   Final    URINE, CLEAN CATCH Performed at [facility], [place] [address] [place], [place] [address]    Special Requests   Final    NONE Performed at [facility], [place] [address], [place], [place] [address]    Culture >=100,000 COLONIES/mL ESCHERICHIA COLI (A)  Final   Report Status 02/25/2022 FINAL  Final   Organism ID, Bacteria ESCHERICHIA COLI (A)  Final      Susceptibility   Escherichia coli - MIC*    AMPICILLIN >=32 RESISTANT Resistant     CEFAZOLIN <=4 SENSITIVE Sensitive     CEFEPIME <=0.12 SENSITIVE Sensitive     CEFTRIAXONE <=0.25 SENSITIVE Sensitive     CIPROFLOXACIN >=4 RESISTANT Resistant     GENTAMICIN <=1 SENSITIVE Sensitive     IMIPENEM <=0.25 SENSITIVE Sensitive     NITROFURANTOIN <=16 SENSITIVE Sensitive     TRIMETH/SULFA >=320 RESISTANT Resistant     AMPICILLIN/SULBACTAM 8 SENSITIVE Sensitive     PIP/TAZO <=4 SENSITIVE Sensitive     * >=100,000 COLONIES/mL ESCHERICHIA COLI    [x]$  Treated with Bactrim, organism resistant to prescribed antimicrobial   New antibiotic prescription: Macrobid 170m PO BID x 5 days  ED Provider: CGenevive Bi PA-C   FCandie Mile2/16/2024, 7:55 AM Clinical Pharmacist Monday - Friday phone -  [phone] - Sunday phone - [phone]

## 2022-02-26 NOTE — Telephone Encounter (Signed)
Post ED Visit - Positive Culture Follow-up: Successful Patient Follow-Up  Culture assessed and recommendations reviewed by:  []$  Elenor Quinones, Pharm.D. []$  Heide Guile, Pharm.D., BCPS AQ-ID []$  Parks Neptune, Pharm.D., BCPS []$  Alycia Rossetti, Pharm.D., BCPS []$  [place], Pharm.D., BCPS, AAHIVP []$  Legrand Como, Pharm.D., BCPS, AAHIVP []$  Salome Arnt, PharmD, BCPS []$  Johnnette Gourd, PharmD, BCPS []$  Hughes Better, PharmD, BCPS []$  Leeroy Cha, PharmD  Positive urine culture  []$  Patient discharged without antimicrobial prescription and treatment is now indicated [x]$  Organism is resistant to prescribed ED discharge antimicrobial []$  Patient with positive blood cultures  Changes discussed with ED provider: Astrid Drafts, PA-C New antibiotic prescription Macrobid 167m PO BID x 5 days Called to [facility], date 02/26/2022, time 9am   RArdeen Fillers2/16/2024, 8:19 AM

## 2022-03-10 ENCOUNTER — Ambulatory Visit
Admission: RE | Admit: 2022-03-10 | Discharge: 2022-03-10 | Disposition: A | Discharge status: Home / Self Care | Payer: 59 | Source: Ambulatory Visit | Attending: Student | Admitting: Student

## 2022-03-10 DIAGNOSIS — R102 Pelvic and perineal pain: Secondary | ICD-10-CM

## 2022-03-10 DIAGNOSIS — N390 Urinary tract infection, site not specified: Secondary | ICD-10-CM

## 2022-03-16 ENCOUNTER — Other Ambulatory Visit: Payer: Self-pay

## 2022-03-16 DIAGNOSIS — Z981 Arthrodesis status: Secondary | ICD-10-CM

## 2022-03-18 ENCOUNTER — Encounter: Payer: Self-pay | Admitting: Neurosurgery

## 2022-03-18 ENCOUNTER — Ambulatory Visit
Admission: RE | Admit: 2022-03-18 | Discharge: 2022-03-18 | Disposition: A | Discharge status: Home / Self Care | Payer: 59 | Source: Ambulatory Visit | Attending: Neurosurgery | Admitting: Neurosurgery

## 2022-03-18 ENCOUNTER — Ambulatory Visit (INDEPENDENT_AMBULATORY_CARE_PROVIDER_SITE_OTHER): Payer: 59 | Admitting: Neurosurgery

## 2022-03-18 VITALS — BP 130/68 | HR 64 | Ht 66.5 in | Wt 175.0 lb

## 2022-03-18 DIAGNOSIS — G8929 Other chronic pain: Secondary | ICD-10-CM | POA: Diagnosis not present

## 2022-03-18 DIAGNOSIS — M25511 Pain in right shoulder: Secondary | ICD-10-CM

## 2022-03-18 DIAGNOSIS — Z981 Arthrodesis status: Secondary | ICD-10-CM | POA: Diagnosis present

## 2022-03-18 NOTE — Progress Notes (Signed)
   REFERRING PHYSICIAN:  No referring provider defined for this encounter.  DOS: 11/02/21    C3-4 ACDF   HISTORY OF PRESENT ILLNESS: [name] is status post ACDF C3-C4.   She is doing much better from a neurological basis.  She continues to have neck pain, and has R shoulder pain from a fall 4 weeks ago.   PHYSICAL EXAMINATION:  NEUROLOGICAL:  General: In no acute distress.   Awake, alert, oriented to person, place, and time.  Pupils equal round and reactive to light.  Facial tone is symmetric.    Strength: Side Biceps Triceps Deltoid Interossei Grip Wrist Ext. Wrist Flex.  R - 5 - '5 5 5 5  '$ L 5 5 4+ '5 5 5 5   '$ Incision c/d/i  Imaging:  No complications noted - some settling  Assessment / Plan: [name] is doing well s/p above surgery.  She does have right shoulder pain after a fall 4 weeks ago.  I will refer her to orthopedic surgery for evaluation.  I will see her back in 6 months with x-rays.    Meade Maw, MD  Dept of Neurosurgery

## 2022-04-14 ENCOUNTER — Ambulatory Visit: Payer: 59 | Admitting: Orthopaedic Surgery

## 2022-04-25 ENCOUNTER — Emergency Department (HOSPITAL_COMMUNITY): Payer: 59

## 2022-04-25 ENCOUNTER — Observation Stay (HOSPITAL_COMMUNITY)
Admission: EM | Admit: 2022-04-25 | Discharge: 2022-04-26 | Disposition: A | Discharge status: Home / Self Care | Payer: 59 | Attending: Internal Medicine | Admitting: Internal Medicine

## 2022-04-25 ENCOUNTER — Encounter (HOSPITAL_COMMUNITY): Payer: Self-pay | Admitting: *Deleted

## 2022-04-25 ENCOUNTER — Other Ambulatory Visit: Payer: Self-pay

## 2022-04-25 ENCOUNTER — Observation Stay (HOSPITAL_COMMUNITY): Payer: 59

## 2022-04-25 DIAGNOSIS — N390 Urinary tract infection, site not specified: Secondary | ICD-10-CM | POA: Diagnosis not present

## 2022-04-25 DIAGNOSIS — N3000 Acute cystitis without hematuria: Secondary | ICD-10-CM

## 2022-04-25 DIAGNOSIS — I129 Hypertensive chronic kidney disease with stage 1 through stage 4 chronic kidney disease, or unspecified chronic kidney disease: Secondary | ICD-10-CM | POA: Diagnosis not present

## 2022-04-25 DIAGNOSIS — Z79899 Other long term (current) drug therapy: Secondary | ICD-10-CM | POA: Diagnosis not present

## 2022-04-25 DIAGNOSIS — F1410 Cocaine abuse, uncomplicated: Secondary | ICD-10-CM | POA: Diagnosis present

## 2022-04-25 DIAGNOSIS — J449 Chronic obstructive pulmonary disease, unspecified: Secondary | ICD-10-CM | POA: Diagnosis not present

## 2022-04-25 DIAGNOSIS — M25511 Pain in right shoulder: Secondary | ICD-10-CM | POA: Diagnosis present

## 2022-04-25 DIAGNOSIS — N1831 Chronic kidney disease, stage 3a: Secondary | ICD-10-CM | POA: Insufficient documentation

## 2022-04-25 DIAGNOSIS — G8911 Acute pain due to trauma: Secondary | ICD-10-CM

## 2022-04-25 DIAGNOSIS — R4182 Altered mental status, unspecified: Secondary | ICD-10-CM | POA: Diagnosis not present

## 2022-04-25 DIAGNOSIS — K219 Gastro-esophageal reflux disease without esophagitis: Secondary | ICD-10-CM | POA: Diagnosis present

## 2022-04-25 DIAGNOSIS — F17210 Nicotine dependence, cigarettes, uncomplicated: Secondary | ICD-10-CM | POA: Insufficient documentation

## 2022-04-25 LAB — CBC WITH DIFFERENTIAL/PLATELET
Abs Immature Granulocytes: 0.01 10*3/uL (ref 0.00–0.07)
Basophils Absolute: 0 10*3/uL (ref 0.0–0.1)
Basophils Relative: 0 %
Eosinophils Absolute: 0.3 10*3/uL (ref 0.0–0.5)
Eosinophils Relative: 5 %
HCT: 35.1 % — ABNORMAL LOW (ref 36.0–46.0)
Hemoglobin: 11.2 g/dL — ABNORMAL LOW (ref 12.0–15.0)
Immature Granulocytes: 0 %
Lymphocytes Relative: 33 %
Lymphs Abs: 2.4 10*3/uL (ref 0.7–4.0)
MCH: 28.4 pg (ref 26.0–34.0)
MCHC: 31.9 g/dL (ref 30.0–36.0)
MCV: 89.1 fL (ref 80.0–100.0)
Monocytes Absolute: 0.5 10*3/uL (ref 0.1–1.0)
Monocytes Relative: 7 %
Neutro Abs: 4 10*3/uL (ref 1.7–7.7)
Neutrophils Relative %: 55 %
Platelets: 291 10*3/uL (ref 150–400)
RBC: 3.94 MIL/uL (ref 3.87–5.11)
RDW: 15.4 % (ref 11.5–15.5)
WBC: 7.3 10*3/uL (ref 4.0–10.5)
nRBC: 0 % (ref 0.0–0.2)

## 2022-04-25 LAB — BLOOD GAS, VENOUS
Acid-base deficit: 2.1 mmol/L — ABNORMAL HIGH (ref 0.0–2.0)
Bicarbonate: 24.2 mmol/L (ref 20.0–28.0)
O2 Saturation: 69.3 %
Patient temperature: 37
pCO2, Ven: 47 mmHg (ref 44–60)
pH, Ven: 7.32 (ref 7.25–7.43)
pO2, Ven: 39 mmHg (ref 32–45)

## 2022-04-25 LAB — URINALYSIS, W/ REFLEX TO CULTURE (INFECTION SUSPECTED)
Bilirubin Urine: NEGATIVE
Glucose, UA: NEGATIVE mg/dL
Hgb urine dipstick: NEGATIVE
Ketones, ur: NEGATIVE mg/dL
Nitrite: POSITIVE — AB
Protein, ur: NEGATIVE mg/dL
Specific Gravity, Urine: 1.016 (ref 1.005–1.030)
pH: 5 (ref 5.0–8.0)

## 2022-04-25 LAB — RAPID URINE DRUG SCREEN, HOSP PERFORMED
Amphetamines: NOT DETECTED
Barbiturates: NOT DETECTED
Benzodiazepines: NOT DETECTED
Cocaine: NOT DETECTED
Opiates: NOT DETECTED
Tetrahydrocannabinol: NOT DETECTED

## 2022-04-25 LAB — COMPREHENSIVE METABOLIC PANEL
ALT: 8 U/L (ref 0–44)
AST: 11 U/L — ABNORMAL LOW (ref 15–41)
Albumin: 3.1 g/dL — ABNORMAL LOW (ref 3.5–5.0)
Alkaline Phosphatase: 75 U/L (ref 38–126)
Anion gap: 7 (ref 5–15)
BUN: 23 mg/dL (ref 8–23)
CO2: 23 mmol/L (ref 22–32)
Calcium: 8.4 mg/dL — ABNORMAL LOW (ref 8.9–10.3)
Chloride: 114 mmol/L — ABNORMAL HIGH (ref 98–111)
Creatinine, Ser: 1.06 mg/dL — ABNORMAL HIGH (ref 0.44–1.00)
GFR, Estimated: 57 mL/min — ABNORMAL LOW (ref >60)
Glucose, Bld: 94 mg/dL (ref 70–99)
Potassium: 4 mmol/L (ref 3.5–5.1)
Sodium: 144 mmol/L (ref 135–145)
Total Bilirubin: 0.5 mg/dL (ref 0.3–1.2)
Total Protein: 6.3 g/dL — ABNORMAL LOW (ref 6.5–8.1)

## 2022-04-25 LAB — LACTIC ACID, PLASMA
Lactic Acid, Venous: 1.3 mmol/L (ref 0.5–1.9)
Lactic Acid, Venous: 1.3 mmol/L (ref 0.5–1.9)

## 2022-04-25 LAB — SALICYLATE LEVEL: Salicylate Lvl: <7 mg/dL — ABNORMAL LOW (ref 7.0–30.0)

## 2022-04-25 LAB — TROPONIN I (HIGH SENSITIVITY)
Troponin I (High Sensitivity): 22 ng/L — ABNORMAL HIGH (ref <18)
Troponin I (High Sensitivity): 22 ng/L — ABNORMAL HIGH (ref <18)

## 2022-04-25 LAB — ACETAMINOPHEN LEVEL: Acetaminophen (Tylenol), Serum: <10 ug/mL — ABNORMAL LOW (ref 10–30)

## 2022-04-25 LAB — ETHANOL: Alcohol, Ethyl (B): <10 mg/dL (ref <10)

## 2022-04-25 MED ORDER — ONDANSETRON HCL 4 MG/2ML IJ SOLN: 4.0000 mg | Freq: Four times a day (QID) | INTRAMUSCULAR | Status: DC | PRN

## 2022-04-25 MED ORDER — LIDOCAINE 5 % EX PTCH
1.0000 | MEDICATED_PATCH | CUTANEOUS | Status: DC
  Administered 2022-04-25 – 2022-04-26 (×2): 1 via TRANSDERMAL
  Filled 2022-04-25 (×2): qty 1

## 2022-04-25 MED ORDER — METOPROLOL TARTRATE 5 MG/5ML IV SOLN: 5.0000 mg | Freq: Four times a day (QID) | INTRAVENOUS | Status: DC | PRN

## 2022-04-25 MED ORDER — FLUTICASONE PROPIONATE 50 MCG/ACT NA SUSP: 1.0000 | Freq: Every day | NASAL | Status: DC | PRN

## 2022-04-25 MED ORDER — PANTOPRAZOLE SODIUM 40 MG PO TBEC
40.0000 mg | DELAYED_RELEASE_TABLET | Freq: Every day | ORAL | Status: DC
  Administered 2022-04-25 – 2022-04-26 (×2): 40 mg via ORAL
  Filled 2022-04-25 (×2): qty 1

## 2022-04-25 MED ORDER — DIPHENHYDRAMINE HCL 25 MG PO CAPS
25.0000 mg | ORAL_CAPSULE | Freq: Four times a day (QID) | ORAL | Status: AC | PRN
  Administered 2022-04-25: 25 mg via ORAL
  Filled 2022-04-25: qty 1

## 2022-04-25 MED ORDER — UMECLIDINIUM-VILANTEROL 62.5-25 MCG/ACT IN AEPB
1.0000 | INHALATION_SPRAY | Freq: Every day | RESPIRATORY_TRACT | Status: DC
  Administered 2022-04-25: 1 via RESPIRATORY_TRACT
  Filled 2022-04-25: qty 14

## 2022-04-25 MED ORDER — AMLODIPINE BESYLATE 5 MG PO TABS
10.0000 mg | ORAL_TABLET | Freq: Every morning | ORAL | Status: DC
  Administered 2022-04-25 – 2022-04-26 (×2): 10 mg via ORAL
  Filled 2022-04-25 (×2): qty 2

## 2022-04-25 MED ORDER — DULOXETINE HCL 30 MG PO CPEP
60.0000 mg | ORAL_CAPSULE | Freq: Every day | ORAL | Status: DC
  Administered 2022-04-25 – 2022-04-26 (×2): 60 mg via ORAL
  Filled 2022-04-25 (×3): qty 2

## 2022-04-25 MED ORDER — OXYCODONE-ACETAMINOPHEN 5-325 MG PO TABS
1.0000 | ORAL_TABLET | Freq: Four times a day (QID) | ORAL | Status: DC | PRN
  Administered 2022-04-25 – 2022-04-26 (×2): 1 via ORAL
  Filled 2022-04-25 (×2): qty 1

## 2022-04-25 MED ORDER — ALBUTEROL SULFATE (2.5 MG/3ML) 0.083% IN NEBU: 2.5000 mg | INHALATION_SOLUTION | RESPIRATORY_TRACT | Status: DC | PRN

## 2022-04-25 MED ORDER — OXYCODONE-ACETAMINOPHEN 5-325 MG PO TABS: 2.0000 | ORAL_TABLET | Freq: Four times a day (QID) | ORAL | Status: DC | PRN

## 2022-04-25 MED ORDER — ATORVASTATIN CALCIUM 40 MG PO TABS
40.0000 mg | ORAL_TABLET | Freq: Every day | ORAL | Status: DC
  Administered 2022-04-25: 40 mg via ORAL
  Filled 2022-04-25: qty 1

## 2022-04-25 MED ORDER — SODIUM CHLORIDE 0.9 % IV SOLN
1.0000 g | INTRAVENOUS | Status: DC
  Administered 2022-04-26: 1 g via INTRAVENOUS
  Filled 2022-04-25: qty 10

## 2022-04-25 MED ORDER — OXYCODONE-ACETAMINOPHEN 5-325 MG PO TABS
1.0000 | ORAL_TABLET | Freq: Four times a day (QID) | ORAL | Status: DC | PRN
  Administered 2022-04-25 (×2): 1 via ORAL
  Filled 2022-04-25 (×2): qty 1

## 2022-04-25 MED ORDER — SODIUM CHLORIDE 0.9 % IV SOLN
1.0000 g | Freq: Once | INTRAVENOUS | Status: AC
  Administered 2022-04-25: 1 g via INTRAVENOUS
  Filled 2022-04-25: qty 10

## 2022-04-25 MED ORDER — ONDANSETRON HCL 4 MG PO TABS: 4.0000 mg | ORAL_TABLET | Freq: Four times a day (QID) | ORAL | Status: DC | PRN

## 2022-04-25 MED ORDER — ACETAMINOPHEN 325 MG PO TABS: 650.0000 mg | ORAL_TABLET | Freq: Four times a day (QID) | ORAL | Status: DC | PRN

## 2022-04-25 MED ORDER — ACETAMINOPHEN 650 MG RE SUPP: 650.0000 mg | Freq: Four times a day (QID) | RECTAL | Status: DC | PRN

## 2022-04-25 MED ORDER — TRAZODONE HCL 50 MG PO TABS: 25.0000 mg | ORAL_TABLET | Freq: Every evening | ORAL | Status: DC | PRN

## 2022-04-25 NOTE — ED Provider Notes (Signed)
[place] EMERGENCY DEPARTMENT AT [facility] Provider Note   CSN: [id number] Arrival date & time: 04/25/22  0056     History  Chief Complaint  Patient presents with   Fall   Shoulder Pain    [name] is a 67 y.o. female.  The history is provided by the patient and medical records.  Fall  Shoulder Pain [name] is a 67 y.o. female who presents to the Emergency Department complaining of shoulder pain. She reports one month of right shoulder pain that she attributes to a fall.  She is unable to state what happened in the fall or when the fall happened but the pain started later.  She cannot state why her pain brought her into the emergency department today as opposed to earlier this month.  She reports chronic cough.  Occasional vomiting.  No chest pain, abdominal pain.  She states she took Aleve PM for her pain tonight.  Denies alcohol or drug use.  She does use tobacco.  Level 5 caveat due to altered mental status.    Home Medications Prior to Admission medications   Medication Sig Start Date End Date Taking? Authorizing Provider  albuterol (VENTOLIN HFA) 108 (90 Base) MCG/ACT inhaler Inhale 1-2 puffs into the lungs every 6 (six) hours as needed for wheezing or shortness of breath. 11/24/20  Yes Erick Blinks, MD  DULoxetine (CYMBALTA) 60 MG capsule Take 60 mg by mouth daily. 04/07/22  Yes [provider]  amitriptyline (ELAVIL) 50 MG tablet Take 50 mg by mouth at bedtime. 09/17/21   [provider]  amLODipine (NORVASC) 10 MG tablet Take 10 mg by mouth every morning. 03/12/19   [provider]  Ernestina Patches 62.5-25 MCG/ACT AEPB Inhale 1 puff into the lungs daily. 09/17/21   [provider]  atorvastatin (LIPITOR) 40 MG tablet Take 1 tablet (40 mg total) by mouth daily at 6 PM. Patient taking differently: Take 40 mg by mouth at bedtime. 08/22/14   Rai, Delene Ruffini, MD  baclofen (LIORESAL) 10 MG tablet Take 10 mg by mouth 3  (three) times daily as needed for muscle spasms. 05/30/21   [provider]  celecoxib (CELEBREX) 200 MG capsule Take 1 capsule (200 mg total) by mouth every 12 (twelve) hours. 11/05/21   Venetia Night, MD  cephALEXin (KEFLEX) 500 MG capsule Take 1 capsule (500 mg total) by mouth 4 (four) times daily. 01/16/22   Clark, Meghan R, PA-C  diclofenac Sodium (VOLTAREN) 1 % GEL Apply 2 g topically 4 (four) times daily as needed (joint pain). 07/18/21   Steffanie Rainwater, MD  DULoxetine (CYMBALTA) 30 MG capsule Take 30 mg by mouth daily. 03/05/21   [provider]  fluticasone (FLONASE) 50 MCG/ACT nasal spray Place 1 spray into both nostrils daily as needed for allergies. 07/13/21   [provider]  gabapentin (NEURONTIN) 400 MG capsule Take 400-800 mg by mouth in the morning, at noon, and at bedtime. 400 mg am, 400 mg afternoon, 800 mg at bedtime 03/26/21   [provider]  ibuprofen (ADVIL) 800 MG tablet Take 1 tablet (800 mg total) by mouth every 6 (six) hours as needed. 01/16/22   Clark, Meghan R, PA-C  lidocaine (LIDODERM) 5 % Place 1 patch onto the skin daily as needed. Remove & Discard patch within 12 hours or as directed by MD 07/02/21   Sloan Leiter, DO  linaclotide (LINZESS) 145 MCG CAPS capsule Take 145 mcg by mouth daily as needed.  [provider]  methylPREDNISolone (MEDROL DOSEPAK) 4 MG TBPK tablet Day 1:  before breakfast, 4 mg after lunch, 4 mg after supper, and 8 mg at bedtime Day 2: 4 mg before breakfast, 4 mg after lunch, 4 mg  after supper, and 8 mg  at bedtime Day 3:  4 mg  before breakfast, 4 mg  after lunch, 4 mg after supper, and 4 mg  at bedtime Day 4: 4 mg  before breakfast, 4 mg  after lunch, and 4 mg at bedtime Day 5: 4 mg  before breakfast and 4 mg at bedtime Day 6: 4 mg  before breakfast 11/15/21   Melene Plan, DO  naloxone [facility]) nasal spray 4 mg/0.1 mL Place 1 spray into the nose once. As needed for overdose    [provider]  oxyCODONE-acetaminophen (PERCOCET/ROXICET) 5-325 MG tablet Take 1 tablet by mouth every 6 (six) hours as needed for severe pain. 02/20/22   Linwood Dibbles, MD  pantoprazole (PROTONIX) 40 MG tablet Take 40 mg by mouth daily.    [provider]  predniSONE (DELTASONE) 10 MG tablet Take 4 tablets (40 mg total) by mouth daily. 01/28/22   Vanetta Mulders, MD      Allergies    Iodine-131, Iohexol, Levofloxacin, Zofran [ondansetron hcl], Heparin, and Morphine and related    Review of Systems   Review of Systems  All other systems reviewed and are negative.   Physical Exam Updated Vital Signs BP 117/66   Pulse 73   Temp 97.9 F (36.6 C) (Oral)   Resp 18   SpO2 98%  Physical Exam Vitals and nursing note reviewed.  Constitutional:      Comments: Sleeping.  Awoken from sleep for evaluation but frequently falls asleep midsentence.  HENT:     Head: Normocephalic and atraumatic.  Cardiovascular:     Rate and Rhythm: Normal rate and regular rhythm.  Pulmonary:     Effort: Pulmonary effort is normal.     Breath sounds: Normal breath sounds.  Abdominal:     Palpations: Abdomen is soft.     Tenderness: There is no abdominal tenderness.  Musculoskeletal:     Comments: Tender to palpation over the right shoulder, right knee.  She fully ranges the right knee spontaneously.  She does not fully range the right shoulder spontaneously.  There is not erythema over the shoulder.  Neurological:     Comments: Difficult to understand at times.  Frequently falls asleep.  She moves all extremities symmetrically.  She does have equal grip strength bilaterally.  Moves bilateral lower extremities strongly in the stretcher.  Mildly dysarthric speech     ED Results / Procedures / Treatments   Labs (all labs ordered are listed, but only abnormal results are displayed) Labs Reviewed  ACETAMINOPHEN LEVEL - Abnormal; Notable for the following components:      Result Value   Acetaminophen (Tylenol),  Serum <10 (*)    All other components within normal limits  SALICYLATE LEVEL - Abnormal; Notable for the following components:   Salicylate Lvl <7.0 (*)    All other components within normal limits  CBC WITH DIFFERENTIAL/PLATELET - Abnormal; Notable for the following components:   Hemoglobin 11.2 (*)    HCT 35.1 (*)    All other components within normal limits  URINALYSIS, W/ REFLEX TO CULTURE (INFECTION SUSPECTED) - Abnormal; Notable for the following components:   Nitrite POSITIVE (*)    Leukocytes,Ua TRACE (*)    Bacteria, UA MANY (*)  All other components within normal limits  BLOOD GAS, VENOUS - Abnormal; Notable for the following components:   Acid-base deficit 2.1 (*)    All other components within normal limits  COMPREHENSIVE METABOLIC PANEL - Abnormal; Notable for the following components:   Chloride 114 (*)    Creatinine, Ser 1.06 (*)    Calcium 8.4 (*)    Total Protein 6.3 (*)    Albumin 3.1 (*)    AST 11 (*)    GFR, Estimated 57 (*)    All other components within normal limits  TROPONIN I (HIGH SENSITIVITY) - Abnormal; Notable for the following components:   Troponin I (High Sensitivity) 22 (*)    All other components within normal limits  TROPONIN I (HIGH SENSITIVITY) - Abnormal; Notable for the following components:   Troponin I (High Sensitivity) 22 (*)    All other components within normal limits  URINE CULTURE  CULTURE, BLOOD (ROUTINE X 2)  CULTURE, BLOOD (ROUTINE X 2)  ETHANOL  RAPID URINE DRUG SCREEN, HOSP PERFORMED  LACTIC ACID, PLASMA  LACTIC ACID, PLASMA    EKG None  Radiology CT Head Wo Contrast  Result Date: 04/25/2022 CLINICAL DATA:  Fall, mental status change EXAM: CT HEAD WITHOUT CONTRAST TECHNIQUE: Contiguous axial images were obtained from the base of the skull through the vertex without intravenous contrast. RADIATION DOSE REDUCTION: This exam was performed according to the departmental dose-optimization program which includes automated  exposure control, adjustment of the mA and/or kV according to patient size and/or use of iterative reconstruction technique. COMPARISON:  CT head 02/20/2022 FINDINGS: Brain: No intracranial hemorrhage, mass effect, or evidence of acute infarct. No hydrocephalus. No extra-axial fluid collection. Generalized cerebral atrophy. Ill-defined hypoattenuation within the cerebral white matter is nonspecific but consistent with chronic small vessel ischemic disease. Vascular: No hyperdense vessel. Intracranial arterial calcification. Skull: No fracture or focal lesion. Sinuses/Orbits: No acute finding. Paranasal sinuses and mastoid air cells are well aerated. Other: None. IMPRESSION: 1. No acute intracranial abnormality. Electronically Signed   By: Minerva Fester M.D.   On: 04/25/2022 03:59   DG FEMUR 1V RIGHT  Result Date: 04/25/2022 CLINICAL DATA:  Fall, right knee pain EXAM: RIGHT FEMUR 1 VIEW COMPARISON:  None Available. FINDINGS: No fracture or dislocation is seen. Right hip joint space is preserved. Right knee described on dedicated knee radiographs. The visualized soft tissues are unremarkable. IMPRESSION: No fracture or dislocation is seen. Electronically Signed   By: Charline Bills M.D.   On: 04/25/2022 03:07   DG Knee Complete 4 Views Right  Result Date: 04/25/2022 CLINICAL DATA:  Fall, right knee pain EXAM: RIGHT KNEE - COMPLETE 4+ VIEW COMPARISON:  None Available. FINDINGS: No fracture or dislocation is seen. 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 The visualized soft tissues are unremarkable, noting vascular calcifications. No suprapatellar knee joint effusion. IMPRESSION: No fracture or dislocation is seen. 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 Electronically Signed   By: Charline Bills M.D.   On: 04/25/2022 03:06   DG Shoulder Right  Result Date: 04/25/2022 CLINICAL DATA:  Fall, right shoulder pain EXAM: RIGHT  SHOULDER - 2+ VIEW COMPARISON:  None Available. FINDINGS: No fracture or dislocation is seen. The joint spaces are preserved. The visualized soft tissues are unremarkable. Visualized right lung is clear. IMPRESSION: Negative. Electronically Signed   By: Charline Bills M.D.   On: 04/25/2022 03:06    Procedures Procedures    Medications Ordered in ED Medications  lidocaine (LIDODERM) 5 % 1 patch (1 patch Transdermal Patch Applied 04/25/22 0421)  cefTRIAXone (ROCEPHIN) 1 g in sodium chloride 0.9 % 100 mL IVPB (1 g Intravenous New Bag/Given 04/25/22 1610)    ED Course/ Medical Decision Making/ A&P                             Medical Decision Making Amount and/or Complexity of Data Reviewed Labs: ordered. Radiology: ordered.  Risk Prescription drug management. Decision regarding hospitalization.   Patient here for evaluation of right shoulder and knee pain in the setting of a fall sometime more than a month ago.  She does report taking Aleve PM prior to presenting to the emergency department due to pain.  Patient is very somnolent and difficult to get much history from.  She falls asleep frequently and needs woken to complete a sentence.  She has no focal deficits on examination but does have pain on her right shoulder and decreased range of motion of the shoulder.  Current examination is not consistent with septic arthritis or gouty arthritis.  Labs significant for mild elevation in troponin.  EKG has nonspecific changes, which is similar when compared to priors.  UA is consistent with UTI-question if UTI is driving her altered mental status.  Patient was observed for several hours in the emergency department with recurrent somnolence.  Plan to admit for altered mental status.        Final Clinical Impression(s) / ED Diagnoses Final diagnoses:  Acute UTI  Altered mental status, unspecified altered mental status type    Rx / DC Orders ED Discharge Orders     None          Tilden Fossa, MD 04/25/22 [phone]

## 2022-04-25 NOTE — Plan of Care (Signed)
  Problem: Education: Goal: Knowledge of General Education information will improve Description: Including pain rating scale, medication(s)/side effects and non-pharmacologic comfort measures Outcome: Progressing   Problem: Health Behavior/Discharge Planning: Goal: Ability to manage health-related needs will improve Outcome: Progressing   Problem: Pain Managment: Goal: General experience of comfort will improve Outcome: Progressing   

## 2022-04-25 NOTE — ED Triage Notes (Signed)
Pt arrived from home with [facility] with ongoing R shoulder and R knee pain since a fall she has a month ago. Pt has rheumatoid arthritis and uses a wheelchair.  Decreased ROM to R shoulder and pain on palpation. Has taken aleve and gabapentin without improvement
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
## 2022-04-25 NOTE — ED Notes (Signed)
Recollect of light green tube sent to lab.  

## 2022-04-25 NOTE — Plan of Care (Signed)
Problem: Education: Goal: Knowledge of General Education information will improve Description: Including pain rating scale, medication(s)/side effects and non-pharmacologic comfort measures Outcome: Progressing   Problem: Clinical Measurements: Goal: Ability to maintain clinical measurements within normal limits will improve Outcome: Progressing   Problem: Activity: Goal: Risk for activity intolerance will decrease Outcome: Progressing   Problem: Elimination: Goal: Will not experience complications related to bowel motility Outcome: Progressing   Haydee Salter, RN 04/25/22 8:02 PM

## 2022-04-26 DIAGNOSIS — N3000 Acute cystitis without hematuria: Secondary | ICD-10-CM | POA: Diagnosis not present

## 2022-04-26 DIAGNOSIS — N390 Urinary tract infection, site not specified: Secondary | ICD-10-CM | POA: Diagnosis not present

## 2022-04-26 LAB — CULTURE, BLOOD (ROUTINE X 2)
Culture: NO GROWTH
Special Requests: ADEQUATE

## 2022-04-26 LAB — BASIC METABOLIC PANEL
Anion gap: 7 (ref 5–15)
BUN: 15 mg/dL (ref 8–23)
CO2: 23 mmol/L (ref 22–32)
Calcium: 8.2 mg/dL — ABNORMAL LOW (ref 8.9–10.3)
Chloride: 106 mmol/L (ref 98–111)
Creatinine, Ser: 0.73 mg/dL (ref 0.44–1.00)
GFR, Estimated: >60 mL/min (ref >60)
Glucose, Bld: 96 mg/dL (ref 70–99)
Potassium: 4.1 mmol/L (ref 3.5–5.1)
Sodium: 136 mmol/L (ref 135–145)

## 2022-04-26 LAB — CBC
HCT: 35.1 % — ABNORMAL LOW (ref 36.0–46.0)
Hemoglobin: 10.9 g/dL — ABNORMAL LOW (ref 12.0–15.0)
MCH: 27.5 pg (ref 26.0–34.0)
MCHC: 31.1 g/dL (ref 30.0–36.0)
MCV: 88.6 fL (ref 80.0–100.0)
Platelets: 291 10*3/uL (ref 150–400)
RBC: 3.96 MIL/uL (ref 3.87–5.11)
RDW: 15.1 % (ref 11.5–15.5)
WBC: 3.8 10*3/uL — ABNORMAL LOW (ref 4.0–10.5)
nRBC: 0 % (ref 0.0–0.2)

## 2022-04-26 LAB — URINE CULTURE

## 2022-04-26 LAB — HIV ANTIBODY (ROUTINE TESTING W REFLEX): HIV Screen 4th Generation wRfx: NONREACTIVE

## 2022-04-26 MED ORDER — HYDRALAZINE HCL 20 MG/ML IJ SOLN: 10.0000 mg | INTRAMUSCULAR | Status: DC | PRN

## 2022-04-26 MED ORDER — SENNOSIDES-DOCUSATE SODIUM 8.6-50 MG PO TABS: 2.0000 | ORAL_TABLET | Freq: Every evening | ORAL | 0 refills | Status: AC | PRN

## 2022-04-26 MED ORDER — METOPROLOL TARTRATE 5 MG/5ML IV SOLN: 5.0000 mg | INTRAVENOUS | Status: DC | PRN

## 2022-04-26 MED ORDER — GUAIFENESIN 100 MG/5ML PO LIQD: 5.0000 mL | ORAL | Status: DC | PRN

## 2022-04-26 MED ORDER — IPRATROPIUM-ALBUTEROL 0.5-2.5 (3) MG/3ML IN SOLN
3.0000 mL | RESPIRATORY_TRACT | Status: DC | PRN
  Administered 2022-04-26: 3 mL via RESPIRATORY_TRACT
  Filled 2022-04-26: qty 3

## 2022-04-26 MED ORDER — ACETAMINOPHEN 325 MG PO TABS: 650.0000 mg | ORAL_TABLET | Freq: Four times a day (QID) | ORAL | Status: DC | PRN

## 2022-04-26 MED ORDER — OXYCODONE HCL 5 MG PO TABS: 5.0000 mg | ORAL_TABLET | ORAL | Status: DC | PRN

## 2022-04-26 MED ORDER — OXYCODONE-ACETAMINOPHEN 5-325 MG PO TABS: 1.0000 | ORAL_TABLET | Freq: Four times a day (QID) | ORAL | 0 refills | Status: DC | PRN

## 2022-04-26 MED ORDER — SENNOSIDES-DOCUSATE SODIUM 8.6-50 MG PO TABS: 1.0000 | ORAL_TABLET | Freq: Every evening | ORAL | Status: DC | PRN

## 2022-04-26 MED ORDER — CEPHALEXIN 500 MG PO CAPS: 500.0000 mg | ORAL_CAPSULE | Freq: Four times a day (QID) | ORAL | 0 refills | Status: AC

## 2022-04-26 NOTE — Evaluation (Signed)
Occupational Therapy Evaluation Patient Details Name: [name] MRN: [id number] DOB: [birth year] Today's Date: 04/26/2022   History of Present Illness 67 yo female admitted with UTI and R shoulder pain. R shoulder xray shows Severe rotator cuff tendinosis with low-grade bursal sided  tearing of the supraspinatus and infraspinatus tendons. No  full-thickness or retracted rotator cuff tear. ortho consulted and pt with dx of Right shoulder rotator cuff tendinopathy versus adhesive capsulitis    PMH: substance abuse, cervical myelopathy, ACDF, RA, CKD, HTN, COPD, tobacco use   Clinical Impression   Patient is a 67 year old female who was admitted for above. Patient was living at home alone with reports that nephew lived with her and helped some if needed. Patient was noted to have increased pain in R shoulder, decreased safety awareness, decreased functional activity tolerance and decreased ROM of RUE impacting participation in ADLs. Patient is motivated to transition home at this time. Patient recommended to follow up with outpatient OT for shoulder ROM as per ortho recommendations. Patient would continue to benefit from skilled OT services at this time while admitted and after d/c to address noted deficits in order to improve overall safety and independence in ADLs.       Recommendations for follow up therapy are one component of a multi-disciplinary discharge planning process, led by the attending physician.  Recommendations may be updated based on patient status, additional functional criteria and insurance authorization.   Assistance Recommended at Discharge Intermittent Supervision/Assistance  Patient can return home with the following Assistance with cooking/housework;Direct supervision/assist for medications management;Assist for transportation;Help with stairs or ramp for entrance;Direct supervision/assist for financial management    Functional Status Assessment  Patient has had a  recent decline in their functional status and demonstrates the ability to make significant improvements in function in a reasonable and predictable amount of time.  Equipment Recommendations  None recommended by OT       Precautions / Restrictions Precautions Precautions: Fall Restrictions Weight Bearing Restrictions: No      Mobility Bed Mobility               General bed mobility comments: patient was in recliner and returned to the same.                Balance Overall balance assessment: Needs assistance, History of Falls Sitting-balance support: No upper extremity supported, Feet supported Sitting balance-Leahy Scale: Good     Standing balance support: During functional activity, Single extremity supported Standing balance-Leahy Scale: Good                             ADL either performed or assessed with clinical judgement   ADL Overall ADL's : Modified independent       General ADL Comments: patient was able to use RW to transfer from recliner to bathroom with increased time with noted leaning on BUE for balance with movement. patient was abl eto don/doff mesh underwear and pants with increased time with MI. patient eager to transition home and transitioned back to recliner to call daughter. patient was not receptive to education from therapist reguarding ways to reduce pain in shoulder with ADL tasks.     Vision   Vision Assessment?: No apparent visual deficits            Pertinent Vitals/Pain Pain Assessment Pain Assessment: Faces Faces Pain Scale: Hurts whole lot Pain Location: pain in lower abdomen Pain Descriptors / Indicators: Discomfort,  Grimacing, Constant Pain Intervention(s): Limited activity within patient's tolerance, Monitored during session, Repositioned     Hand Dominance Right   Extremity/Trunk Assessment Upper Extremity Assessment Upper Extremity Assessment: RUE deficits/detail RUE Deficits / Details: patient with  increased pain in RUE but able to use UE functionally to participate in ADLs during session. FF about 30 degrees and ABdcution 40 degrees with notable grimancing. RUE: Unable to fully assess due to pain LUE Deficits / Details: grossly WFL   Lower Extremity Assessment Lower Extremity Assessment: Defer to PT evaluation   Cervical / Trunk Assessment Cervical / Trunk Assessment: Normal   Communication Communication Communication: No difficulties   Cognition Arousal/Alertness: Awake/alert Behavior During Therapy: WFL for tasks assessed/performed Overall Cognitive Status: Within Functional Limits for tasks assessed                  Home Living Family/patient expects to be discharged to:: Private residence Living Arrangements: Alone Available Help at Discharge: Family;Available PRN/intermittently Type of Home: Apartment Home Access: Level entry;Elevator     Home Layout: One level     Bathroom Shower/Tub: Producer, television/film/video: Standard     Home Equipment: Cane - single point;Shower seat;Grab bars - tub/shower;Hand held shower head;Grab bars - toilet;Hospital bed;Wheelchair - Surveyor, quantity (2 wheels)   Additional Comments: pt reports her nephew is staying with her currently      Prior Functioning/Environment Prior Level of Function : Needs assist       Physical Assist : Mobility (physical);ADLs (physical) Mobility (physical): Gait ADLs (physical): IADLs Mobility Comments: amb with RW or uses her hoveround in the apt and when going longer distances.  pt endorses recent fall ADLs Comments: per OT: pt reports modI with ADLs, wears pull ups        OT Problem List: Decreased strength;Decreased activity tolerance;Impaired balance (sitting and/or standing);Decreased coordination;Decreased safety awareness;Decreased knowledge of precautions;Impaired UE functional use;Pain      OT Treatment/Interventions: Self-care/ADL training;Energy conservation;DME  and/or AE instruction;Therapeutic exercise;Therapeutic activities;Patient/family education;Balance training    OT Goals(Current goals can be found in the care plan section) Acute Rehab OT Goals Patient Stated Goal: to go home OT Goal Formulation: With patient Time For Goal Achievement: 05/17/22 Potential to Achieve Goals: Fair  OT Frequency: Min 2X/week       AM-PAC OT "6 Clicks" Daily Activity     Outcome Measure Help from another person eating meals?: None Help from another person taking care of personal grooming?: None Help from another person toileting, which includes using toliet, bedpan, or urinal?: A Little (supevision) Help from another person bathing (including washing, rinsing, drying)?: None Help from another person to put on and taking off regular upper body clothing?: None Help from another person to put on and taking off regular lower body clothing?: None 6 Click Score: 23   End of Session Equipment Utilized During Treatment: Rolling walker (2 wheels) Nurse Communication: Mobility status;Patient requests pain meds (and laxative)  Activity Tolerance: Patient limited by pain Patient left: in chair;with call bell/phone within reach;with chair alarm set  OT Visit Diagnosis: Unsteadiness on feet (R26.81);Other abnormalities of gait and mobility (R26.89);Muscle weakness (generalized) (M62.81);Pain Pain - Right/Left: Right Pain - part of body: Shoulder                Time: 7340-3709 OT Time Calculation (min): 11 min Charges:  OT General Charges $OT Visit: 1 Visit OT Evaluation $OT Eval Low Complexity: 1 Low  Kraig Genis OTR/L, MS Acute Rehabilitation Department Office# [phone]  Selinda Flavin 04/26/2022, 12:58 PM

## 2022-04-26 NOTE — Evaluation (Signed)
Physical Therapy Evaluation Patient Details Name: [name] MRN: [id number] DOB: [birth year] Today's Date: 04/26/2022  History of Present Illness  67 yo female admitted with UTI and R shoulder pain. R shoulder xray shows Severe rotator cuff tendinosis with low-grade bursal sided  tearing of the supraspinatus and infraspinatus tendons. No  full-thickness or retracted rotator cuff tear. ortho consulted and pt with dx of Right shoulder rotator cuff tendinopathy versus adhesive capsulitis    PMH: substance abuse, cervical myelopathy, ACDF, RA, CKD, HTN, COPD, tobacco use  Clinical Impression  Patient evaluated by Physical Therapy with no further acute PT needs identified. All education has been completed and the patient has no further questions.  Pt is asking to go home. Pt up with RN on PT arrival, she appears grossly at her baseline for functional mobility; she has all necessary DME    PT is signing off. Thank you for this referral.        Recommendations for follow up therapy are one component of a multi-disciplinary discharge planning process, led by the attending physician.  Recommendations may be updated based on patient status, additional functional criteria and insurance authorization.  Follow Up Recommendations       Assistance Recommended at Discharge Intermittent Supervision/Assistance  Patient can return home with the following  Assist for transportation;Help with stairs or ramp for entrance    Equipment Recommendations None recommended by PT  Recommendations for Other Services       Functional Status Assessment Patient has not had a recent decline in their functional status     Precautions / Restrictions Precautions Precautions: Fall Restrictions Weight Bearing Restrictions: No      Mobility  Bed Mobility               General bed mobility comments: OOB with nursing on arrival    Transfers Overall transfer level: Needs assistance Equipment used:  Rolling walker (2 wheels) Transfers: Sit to/from Stand Sit to Stand: Supervision           General transfer comment: x2, no physical assist    Ambulation/Gait Ambulation/Gait assistance: Supervision, Min guard Gait Distance (Feet): 50 Feet Assistive device: Rolling walker (2 wheels) Gait Pattern/deviations: Step-through pattern, Decreased stride length, Trunk flexed       General Gait Details: knees flexed slightly in stance. min/guard for safety. no overt LOB. pt reports she does not amb long distances at baseline  Information systems manager Rankin (Stroke Patients Only)       Balance Overall balance assessment: Needs assistance, History of Falls Sitting-balance support: No upper extremity supported, Feet supported Sitting balance-Leahy Scale: Good     Standing balance support: Reliant on assistive device for balance Standing balance-Leahy Scale: Fair                               Pertinent Vitals/Pain Pain Assessment Pain Assessment: No/denies pain    Home Living Family/patient expects to be discharged to:: Private residence Living Arrangements: Alone Available Help at Discharge: Family Type of Home: Apartment Home Access: Level entry;Elevator       Home Layout: One level Home Equipment: Cane - single point;Shower seat;Grab bars - tub/shower;Hand held shower head;Grab bars - toilet;Hospital bed;Wheelchair - Surveyor, quantity (2 wheels) Additional Comments: pt reports her nephew is staying with her currently    Prior Function  Mobility Comments: amb with RW or uses her hoveround in the apt and when going longer distances.  pt endorses recent fall       Hand Dominance        Extremity/Trunk Assessment   Upper Extremity Assessment Upper Extremity Assessment: RUE deficits/detail;LUE deficits/detail RUE Deficits / Details: grossly WFL for activities tested; see ortho note for ROM  limitations LUE Deficits / Details: grossly WFL    Lower Extremity Assessment Lower Extremity Assessment: Overall WFL for tasks assessed       Communication   Communication: No difficulties  Cognition Arousal/Alertness: Awake/alert Behavior During Therapy: WFL for tasks assessed/performed Overall Cognitive Status: Within Functional Limits for tasks assessed                                          General Comments      Exercises     Assessment/Plan    PT Assessment All further PT needs can be met in the next venue of care  PT Problem List         PT Treatment Interventions      PT Goals (Current goals can be found in the Care Plan section)  Acute Rehab PT Goals Patient Stated Goal: wants to go home PT Goal Formulation: All assessment and education complete, DC therapy    Frequency       Co-evaluation               AM-PAC PT "6 Clicks" Mobility  Outcome Measure Help needed turning from your back to your side while in a flat bed without using bedrails?: None Help needed moving from lying on your back to sitting on the side of a flat bed without using bedrails?: None Help needed moving to and from a bed to a chair (including a wheelchair)?: None Help needed standing up from a chair using your arms (e.g., wheelchair or bedside chair)?: None Help needed to walk in hospital room?: A Little Help needed climbing 3-5 steps with a railing? : A Little 6 Click Score: 22    End of Session   Activity Tolerance: Patient tolerated treatment well Patient left: in chair;with call bell/phone within reach (no alarm box in room)   PT Visit Diagnosis: Other abnormalities of gait and mobility (R26.89);Repeated falls (R29.6)    Time: 1140-1150 PT Time Calculation (min) (ACUTE ONLY): 10 min   Charges:   PT Evaluation $PT Eval Low Complexity: 1 Low          [name], PT  Acute Rehab Dept [facility])  [phone]  04/26/2022   [facility] 04/26/2022, 12:10 PM
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
## 2022-04-26 NOTE — Plan of Care (Signed)
  Problem: Education: Goal: Knowledge of General Education information will improve Description: Including pain rating scale, medication(s)/side effects and non-pharmacologic comfort measures Outcome: Progressing   Problem: Health Behavior/Discharge Planning: Goal: Ability to manage health-related needs will improve Outcome: Progressing   Problem: Activity: Goal: Risk for activity intolerance will decrease Outcome: Progressing   Problem: Elimination: Goal: Will not experience complications related to bowel motility Outcome: Progressing Goal: Will not experience complications related to urinary retention Outcome: Progressing   Problem: Safety: Goal: Ability to remain free from injury will improve Outcome: Progressing   

## 2022-04-26 NOTE — TOC Transition Note (Signed)
Transition of Care [facility]) - CM/SW Discharge Note  Patient Details  Name: [name] MRN: [id number] Date of Birth: 1955/03/12  Transition of Care [facility]) CM/SW Contact:  Ewing Schlein, LCSW Phone Number: 04/26/2022, 2:05 PM  Clinical Narrative: PT evaluation recommended OPPT. CSW completed OPPT referral to [facility] office and will need to be signed by patient's PCP. TOC signing off.    Final next level of care: OP Rehab Barriers to Discharge: No Barriers Identified  Discharge Plan and Services Additional resources added to the After Visit Summary for         DME Arranged: N/A DME Agency: NA  Social Determinants of Health (SDOH) Interventions SDOH Screenings   Food Insecurity: No Food Insecurity (04/25/2022)  Housing: Low Risk  (04/25/2022)  Transportation Needs: No Transportation Needs (04/25/2022)  Utilities: Not At Risk (04/25/2022)  Financial Resource Strain: High Risk (06/24/2018)  Tobacco Use: High Risk (04/25/2022)   Readmission Risk Interventions    04/05/2021    5:04 PM 11/13/2020   10:26 AM 11/10/2020   11:54 AM  Readmission Risk Prevention Plan  Transportation Screening Complete Complete Complete  PCP or Specialist Appt within 5-7 Days   Complete  PCP or Specialist Appt within 3-5 Days  Complete   Home Care Screening   Complete  Medication Review (RN CM)   Complete  HRI or Home Care Consult  Complete   Social Work Consult for Recovery Care Planning/Counseling  Complete   Palliative Care Screening  Complete   Medication Review Oceanographer) Complete Complete   PCP or Specialist appointment within 3-5 days of discharge Complete    HRI or Home Care Consult Complete    SW Recovery Care/Counseling Consult Complete    Palliative Care Screening Complete    Skilled Nursing Facility Complete

## 2022-04-26 NOTE — Discharge Summary (Signed)
Physician Discharge Summary  Curry Dulski UJW:[id number] DOB: 1955/05/29 DOA: 04/25/2022  PCP: Ellyn Hack, MD  Admit date: 04/25/2022 Discharge date: 04/26/2022  Admitted From: Home Disposition:  Home  Recommendations for Outpatient Follow-up:  Follow up with PCP in 1-2 weeks Please obtain BMP/CBC in one week your next doctors visit.  Outpatient follow-up with orthopedic for her right shoulder pain.  Seen by their service in the hospital Advised patient to continue and complete her course of cephalexin x 5 days Pain medication with bowel regimen prescribed for her shoulder   Discharge Condition: Stable CODE STATUS: Full  Diet recommendation: Regular  Brief/Interim Summary: 67 year old female with history of GERD, rheumatoid arthritis comes to the hospital with complaints of right shoulder pain and change in mental status.  Patient was found to have urinary tract infection and MRI demonstrating right shoulder tendinosis/tearing.  Patient was started on IV Rocephin.  Patient was doing very well the following day, her previous cultures were reviewed and she was growing pansensitive E. coli therefore antibiotics were transitioned to oral Keflex to complete total 5-day course. She was also seen in the hospital for right shoulder pain and MRI demonstrating tendinosis with mild bursal tearing.  Seen by orthopedic who recommends outpatient follow-up  Medically stable for discharge     Assessment & Plan:  Principal Problem:   UTI (urinary tract infection) Active Problems:   Cocaine abuse (HCC)   COPD (chronic obstructive pulmonary disease)   GERD without esophagitis   Acute pain of right shoulder due to trauma     Acute metabolic encephalopathy Urinary tract infection WITHOUT HEMATURIA - Encephalopathy has resolved, CT head is negative.  Cultures are growing gram-negative rods.  Patient really wants to go home, previous cultures growing E. coli which is pansensitive.  Will  transition IV Rocephin to oral Keflex.   Right shoulder pain and discomfort - MRI demonstrating tendinosis with mild bursal tearing.  Orthopedic recommended outpatient follow-up   PATIENT DOES NOT HAVE CKD   Hyperlipidemia - Statin   Essential hypertension - Norvasc.  IV as needed   History of cocaine use -UDS negative   COPD -As needed bronchodilators   GERD -PPI   Discharge Diagnoses:  Principal Problem:   UTI (urinary tract infection) Active Problems:   Cocaine abuse (HCC)   COPD (chronic obstructive pulmonary disease)   GERD without esophagitis   Acute pain of right shoulder due to trauma      Consultations: Ortho  Subjective: Doing ok Right shoulder pain but no other complaints.   Discharge Exam: Vitals:   04/26/22 0931 04/26/22 1148  BP: (!) 173/79   Pulse: (!) 55   Resp:    Temp:    SpO2:  97%   Vitals:   04/25/22 2155 04/26/22 0643 04/26/22 0931 04/26/22 1148  BP: 125/81 (!) 141/90 (!) 173/79   Pulse: 63 63 (!) 55   Resp: 18 16    Temp: 98 F (36.7 C) 97.7 F (36.5 C)    TempSrc: Oral Oral    SpO2: 95% 100%  97%  Weight:      Height:        General: Pt is alert, awake, not in acute distress Cardiovascular: RRR, S1/S2 +, no rubs, no gallops Respiratory: CTA bilaterally, no wheezing, no rhonchi Abdominal: Soft, NT, ND, bowel sounds + Extremities: no edema, no cyanosis Limited ROM of her right shoulder.   Discharge Instructions   Allergies as of 04/26/2022       Reactions  Iodine-131 Anaphylaxis   Neck tightens up   Iohexol Anaphylaxis   Levofloxacin Other (See Comments)   BASELINE PROLONGED QTc.     Zofran [ondansetron Hcl] Other (See Comments)   BASELINE PROLONGED QTc.   Heparin Itching, Other (See Comments)   Pt is able to tolerate IV heparin, but not sub-Q.   Morphine And Related Itching        Medication List     STOP taking these medications    celecoxib 200 MG capsule Commonly known as: CELEBREX    ibuprofen 800 MG tablet Commonly known as: ADVIL   methylPREDNISolone 4 MG Tbpk tablet Commonly known as: MEDROL DOSEPAK   predniSONE 10 MG tablet Commonly known as: DELTASONE       TAKE these medications    acetaminophen 325 MG tablet Commonly known as: TYLENOL Take 2 tablets (650 mg total) by mouth every 6 (six) hours as needed for mild pain (or Fever >/= 101).   albuterol 108 (90 Base) MCG/ACT inhaler Commonly known as: VENTOLIN HFA Inhale 1-2 puffs into the lungs every 6 (six) hours as needed for wheezing or shortness of breath.   amitriptyline 50 MG tablet Commonly known as: ELAVIL Take 50 mg by mouth at bedtime.   amLODipine 10 MG tablet Commonly known as: NORVASC Take 10 mg by mouth every morning.   Anoro Ellipta 62.5-25 MCG/ACT Aepb Generic drug: umeclidinium-vilanterol Inhale 1 puff into the lungs daily.   atorvastatin 40 MG tablet Commonly known as: LIPITOR Take 1 tablet (40 mg total) by mouth daily at 6 PM. What changed: when to take this   baclofen 10 MG tablet Commonly known as: LIORESAL Take 10 mg by mouth 3 (three) times daily as needed for muscle spasms.   cephALEXin 500 MG capsule Commonly known as: KEFLEX Take 1 capsule (500 mg total) by mouth 4 (four) times daily for 5 days.   diclofenac Sodium 1 % Gel Commonly known as: VOLTAREN Apply 2 g topically 4 (four) times daily as needed (joint pain).   DULoxetine 60 MG capsule Commonly known as: CYMBALTA Take 60 mg by mouth daily.   gabapentin 400 MG capsule Commonly known as: NEURONTIN Take 400-800 mg by mouth 2 (two) times daily.   lidocaine 5 % Commonly known as: Lidoderm Place 1 patch onto the skin daily as needed. Remove & Discard patch within 12 hours or as directed by MD   linaclotide 145 MCG Caps capsule Commonly known as: LINZESS Take 145 mcg by mouth daily as needed.   naloxone 4 MG/0.1ML Liqd nasal spray kit Commonly known as: NARCAN Place 1 spray into the nose once. As  needed for overdose   oxyCODONE-acetaminophen 5-325 MG tablet Commonly known as: PERCOCET/ROXICET Take 1 tablet by mouth every 6 (six) hours as needed for severe pain.   pantoprazole 40 MG tablet Commonly known as: PROTONIX Take 40 mg by mouth daily.   senna-docusate 8.6-50 MG tablet Commonly known as: Senokot-S Take 2 tablets by mouth at bedtime as needed for moderate constipation.        Follow-up Information     Ellyn Hack, MD Follow up in 1 week(s).   Specialty: Family Medicine Contact information: [address] [place] [address] [phone]                Allergies  Allergen Reactions   Iodine-131 Anaphylaxis     Neck tightens up   Iohexol Anaphylaxis   Levofloxacin Other (See Comments)    BASELINE PROLONGED QTc.  Zofran [Ondansetron Hcl] Other (See Comments)    BASELINE PROLONGED QTc.   Heparin Itching and Other (See Comments)    Pt is able to tolerate IV heparin, but not sub-Q.   Morphine And Related Itching    You were cared for by a hospitalist during your hospital stay. If you have any questions about your discharge medications or the care you received while you were in the hospital after you are discharged, you can call the unit and asked to speak with the hospitalist on call if the hospitalist that took care of you is not available. Once you are discharged, your primary care physician will handle any further medical issues. Please note that no refills for any discharge medications will be authorized once you are discharged, as it is imperative that you return to your primary care physician (or establish a relationship with a primary care physician if you do not have one) for your aftercare needs so that they can reassess your need for medications and monitor your lab values.  You were cared for by a hospitalist during your hospital stay. If you have any questions about your discharge medications or the care you received while you were in  the hospital after you are discharged, you can call the unit and asked to speak with the hospitalist on call if the hospitalist that took care of you is not available. Once you are discharged, your primary care physician will handle any further medical issues. Please note that NO REFILLS for any discharge medications will be authorized once you are discharged, as it is imperative that you return to your primary care physician (or establish a relationship with a primary care physician if you do not have one) for your aftercare needs so that they can reassess your need for medications and monitor your lab values.  Please request your Prim.MD to go over all Hospital Tests and Procedure/Radiological results at the follow up, please get all Hospital records sent to your Prim MD by signing hospital release before you go home.  Get CBC, CMP, 2 view Chest X ray checked  by Primary MD during your next visit or SNF MD in 5-7 days ( we routinely change or add medications that can affect your baseline labs and fluid status, therefore we recommend that you get the mentioned basic workup next visit with your PCP, your PCP may decide not to get them or add new tests based on their clinical decision)  On your next visit with your primary care physician please Get Medicines reviewed and adjusted.  If you experience worsening of your admission symptoms, develop shortness of breath, life threatening emergency, suicidal or homicidal thoughts you must seek medical attention immediately by calling 911 or calling your MD immediately  if symptoms less severe.  You Must read complete instructions/literature along with all the possible adverse reactions/side effects for all the Medicines you take and that have been prescribed to you. Take any new Medicines after you have completely understood and accpet all the possible adverse reactions/side effects.   Do not drive, operate heavy machinery, perform activities at heights,  swimming or participation in water activities or provide baby sitting services if your were admitted for syncope or siezures until you have seen by Primary MD or a Neurologist and advised to do so again.  Do not drive when taking Pain medications.   Procedures/Studies: MR SHOULDER RIGHT WO CONTRAST  Result Date: 04/25/2022 CLINICAL DATA:  Shoulder trauma, rotator cuff tear suspected, xray done  EXAM: MRI OF THE RIGHT SHOULDER WITHOUT CONTRAST TECHNIQUE: Multiplanar, multisequence MR imaging of the shoulder was performed. No intravenous contrast was administered. COMPARISON:  X-ray 04/25/2022 FINDINGS: Technical Note: Despite efforts by the technologist and patient, motion artifact is present on today's exam and could not be eliminated. This reduces exam sensitivity and specificity. Rotator cuff: Severe tendinosis of the supraspinatus, infraspinatus, and subscapularis tendons. Low-grade bursal sided tearing of the supraspinatus and infraspinatus tendons. No full-thickness or retracted rotator cuff tear. Muscles: Preserved bulk and signal intensity of the rotator cuff musculature without edema, atrophy, or fatty infiltration. Biceps long head: Intact with mild intra-articular biceps tendinosis. Acromioclavicular Joint: Mild degenerative changes of the AC joint. Large volume complex subacromial-subdeltoid bursal fluid. Glenohumeral Joint: No joint effusion. Mild chondral thinning without a focal defect on motion degraded imaging. Labrum:  Suboptimally assessed.  No paralabral cyst. Bones: No acute fracture. No dislocation. No bone marrow edema. No marrow replacing bone lesion. Other: None. IMPRESSION: 1. Severe rotator cuff tendinosis with low-grade bursal sided tearing of the supraspinatus and infraspinatus tendons. No full-thickness or retracted rotator cuff tear. 2. Severe subacromial-subdeltoid bursitis. 3. Mild intra-articular biceps tendinosis. 4. Mild glenohumeral and AC joint osteoarthritis.  Electronically Signed   By: Duanne Guess D.O.   On: 04/25/2022 13:30   CT Head Wo Contrast  Result Date: 04/25/2022 CLINICAL DATA:  Fall, mental status change EXAM: CT HEAD WITHOUT CONTRAST TECHNIQUE: Contiguous axial images were obtained from the base of the skull through the vertex without intravenous contrast. RADIATION DOSE REDUCTION: This exam was performed according to the departmental dose-optimization program which includes automated exposure control, adjustment of the mA and/or kV according to patient size and/or use of iterative reconstruction technique. COMPARISON:  CT head 02/20/2022 FINDINGS: Brain: No intracranial hemorrhage, mass effect, or evidence of acute infarct. No hydrocephalus. No extra-axial fluid collection. Generalized cerebral atrophy. Ill-defined hypoattenuation within the cerebral white matter is nonspecific but consistent with chronic small vessel ischemic disease. Vascular: No hyperdense vessel. Intracranial arterial calcification. Skull: No fracture or focal lesion. Sinuses/Orbits: No acute finding. Paranasal sinuses and mastoid air cells are well aerated. Other: None. IMPRESSION: 1. No acute intracranial abnormality. Electronically Signed   By: Minerva Fester M.D.   On: 04/25/2022 03:59   DG FEMUR 1V RIGHT  Result Date: 04/25/2022 CLINICAL DATA:  Fall, right knee pain EXAM: RIGHT FEMUR 1 VIEW COMPARISON:  None Available. FINDINGS: No fracture or dislocation is seen. Right hip joint space is preserved. Right knee described on dedicated knee radiographs. The visualized soft tissues are unremarkable. IMPRESSION: No fracture or dislocation is seen. Electronically Signed   By: Charline Bills M.D.   On: 04/25/2022 03:07   DG Knee Complete 4 Views Right  Result Date: 04/25/2022 CLINICAL DATA:  Fall, right knee pain EXAM: RIGHT KNEE - COMPLETE 4+ VIEW COMPARISON:  None Available. FINDINGS: No fracture or dislocation is seen. Moderate to severe tricompartmental degenerative  changes, most prominent in the lateral compartment. The visualized soft tissues are unremarkable, noting vascular calcifications. No suprapatellar knee joint effusion. IMPRESSION: No fracture or dislocation is seen. 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 Electronically Signed   By: Charline Bills M.D.   On: 04/25/2022 03:06   DG Shoulder Right  Result Date: 04/25/2022 CLINICAL DATA:  Fall, right shoulder pain EXAM: RIGHT SHOULDER - 2+ VIEW COMPARISON:  None Available. FINDINGS: No fracture or dislocation is seen. The joint spaces are preserved. The visualized soft tissues are unremarkable. Visualized right lung is clear. IMPRESSION: Negative.  Electronically Signed   By: Charline Bills M.D.   On: 04/25/2022 03:06     The results of significant diagnostics from this hospitalization (including imaging, microbiology, ancillary and laboratory) are listed below for reference.     Microbiology: Recent Results (from the past 240 hour(s))  Culture, blood (routine x 2)     Status: None (Preliminary result)   Collection Time: 04/25/22  5:00 AM   Specimen: BLOOD  Result Value Ref Range Status   Specimen Description   Final    BLOOD SITE NOT SPECIFIED Performed at [facility], [address], [place], [place] [address]    Special Requests   Final    BOTTLES DRAWN AEROBIC AND ANAEROBIC Blood Culture adequate volume Performed at [facility], [address], [place], [place] [address]    Culture   Final    NO GROWTH < 24 HOURS Performed at [facility], [address], [place], [place] [address]    Report Status PENDING  Incomplete  Urine Culture     Status: Abnormal (Preliminary result)   Collection Time: 04/25/22  5:32 AM   Specimen: Urine, Random  Result Value Ref Range Status   Specimen Description   Final    URINE, RANDOM Performed at [facility], [address], [place], [place] [address]    Special  Requests   Final    NONE Reflexed from [id number] Performed at [facility], [address] [place], [place] [address]    Culture (A)  Final    >=100,000 COLONIES/mL GRAM NEGATIVE RODS SUSCEPTIBILITIES TO FOLLOW Performed at [facility], [address] [place], [place] [address]    Report Status PENDING  Incomplete  Culture, blood (routine x 2)     Status: None (Preliminary result)   Collection Time: 04/25/22  6:50 AM   Specimen: BLOOD  Result Value Ref Range Status   Specimen Description   Final    BLOOD SITE NOT SPECIFIED Performed at [facility], [address] [place], [place] [address]    Special Requests   Final    BOTTLES DRAWN AEROBIC AND ANAEROBIC Blood Culture results may not be optimal due to an inadequate volume of blood received in culture bottles Performed at [facility], [address], [place], [place] [address]    Culture   Final    NO GROWTH < 24 HOURS Performed at [facility], [address] [place], [place] [address]    Report Status PENDING  Incomplete     Labs: BNP (last 3 results) No results for input(s): "BNP" in the last 8760 hours. Basic Metabolic Panel: Recent Labs  Lab 04/25/22 0500 04/26/22 0338  NA 144 136  K 4.0 4.1  CL 114* 106  CO2 23 23  GLUCOSE 94 96  BUN 23 15  CREATININE 1.06* 0.73  CALCIUM 8.4* 8.2*   Liver Function Tests: Recent Labs  Lab 04/25/22 0500  AST 11*  ALT 8  ALKPHOS 75  BILITOT 0.5  PROT 6.3*  ALBUMIN 3.1*   No results for input(s): "LIPASE", "AMYLASE" in the last 168 hours. No results for input(s): "AMMONIA" in the last 168 hours. CBC: Recent Labs  Lab 04/25/22 0417 04/26/22 0338  WBC 7.3 3.8*  NEUTROABS 4.0  --   HGB 11.2* 10.9*  HCT 35.1* 35.1*  MCV 89.1 88.6  PLT 291 291   Cardiac Enzymes: No results for input(s): "CKTOTAL", "  CKMB", "CKMBINDEX", "TROPONINI" in the last 168 hours. BNP: Invalid input(s):  "POCBNP" CBG: No results for input(s): "GLUCAP" in the last 168 hours. D-Dimer No results for input(s): "DDIMER" in the last 72 hours. Hgb A1c No results for input(s): "HGBA1C" in the last 72 hours. Lipid Profile No results for input(s): "CHOL", "HDL", "LDLCALC", "TRIG", "CHOLHDL", "LDLDIRECT" in the last 72 hours. Thyroid function studies No results for input(s): "TSH", "T4TOTAL", "T3FREE", "THYROIDAB" in the last 72 hours.  Invalid input(s): "FREET3" Anemia work up No results for input(s): "VITAMINB12", "FOLATE", "FERRITIN", "TIBC", "IRON", "RETICCTPCT" in the last 72 hours. Urinalysis    Component Value Date/Time   COLORURINE YELLOW 04/25/2022 0532   APPEARANCEUR CLEAR 04/25/2022 0532   LABSPEC 1.016 04/25/2022 0532   PHURINE 5.0 04/25/2022 0532   GLUCOSEU NEGATIVE 04/25/2022 0532   HGBUR NEGATIVE 04/25/2022 0532   BILIRUBINUR NEGATIVE 04/25/2022 0532   KETONESUR NEGATIVE 04/25/2022 0532   PROTEINUR NEGATIVE 04/25/2022 0532   UROBILINOGEN 0.2 03/06/2021 1417   NITRITE POSITIVE (A) 04/25/2022 0532   LEUKOCYTESUR TRACE (A) 04/25/2022 0532   Sepsis Labs Recent Labs  Lab 04/25/22 0417 04/26/22 0338  WBC 7.3 3.8*   Microbiology Recent Results (from the past 240 hour(s))  Culture, blood (routine x 2)     Status: None (Preliminary result)   Collection Time: 04/25/22  5:00 AM   Specimen: BLOOD  Result Value Ref Range Status   Specimen Description   Final    BLOOD SITE NOT SPECIFIED Performed at [facility], [address] [place], [place] [address]    Special Requests   Final    BOTTLES DRAWN AEROBIC AND ANAEROBIC Blood Culture adequate volume Performed at [facility], [address] [place], [place] [address]    Culture   Final    NO GROWTH < 24 HOURS Performed at [facility], [address], [place], [place] [address]    Report Status PENDING  Incomplete  Urine Culture     Status: Abnormal (Preliminary result)    Collection Time: 04/25/22  5:32 AM   Specimen: Urine, Random  Result Value Ref Range Status   Specimen Description   Final    URINE, RANDOM Performed at [facility], [address], [place], [place] [address]    Special Requests   Final    NONE Reflexed from [id number] Performed at [facility], [address] [place], [place] [address]    Culture (A)  Final    >=100,000 COLONIES/mL GRAM NEGATIVE RODS SUSCEPTIBILITIES TO FOLLOW Performed at [facility], [address] [place], [place] [address]    Report Status PENDING  Incomplete  Culture, blood (routine x 2)     Status: None (Preliminary result)   Collection Time: 04/25/22  6:50 AM   Specimen: BLOOD  Result Value Ref Range Status   Specimen Description   Final    BLOOD SITE NOT SPECIFIED Performed at [facility], [address] [place], [place] [address]    Special Requests   Final    BOTTLES DRAWN AEROBIC AND ANAEROBIC Blood Culture results may not be optimal due to an inadequate volume of blood received in culture bottles Performed at [facility], [address], [place], [place] [address]    Culture   Final    NO GROWTH < 24 HOURS Performed at [facility], [address] [place], [place] [address]    Report Status PENDING  Incomplete     Time coordinating discharge:  I have spent 35 minutes face to face with the patient and on the ward discussing the patients care, assessment, plan and disposition with other care givers. >50% of the time was devoted counseling the patient about the risks and benefits of treatment/Discharge disposition and coordinating care.   SIGNED:   Dimple Nanas, MD  [facility] 04/26/2022, 1:39 PM   If 7PM-7AM, please contact night-coverage

## 2022-04-27 LAB — URINE CULTURE: Culture: >=100000 — AB

## 2022-04-27 LAB — CULTURE, BLOOD (ROUTINE X 2): Culture: NO GROWTH

## 2022-04-28 LAB — CULTURE, BLOOD (ROUTINE X 2)

## 2022-04-29 LAB — CULTURE, BLOOD (ROUTINE X 2)

## 2022-05-10 ENCOUNTER — Ambulatory Visit (INDEPENDENT_AMBULATORY_CARE_PROVIDER_SITE_OTHER): Payer: 59 | Admitting: Orthopaedic Surgery

## 2022-05-10 DIAGNOSIS — M25511 Pain in right shoulder: Secondary | ICD-10-CM | POA: Diagnosis not present

## 2022-05-10 DIAGNOSIS — G8929 Other chronic pain: Secondary | ICD-10-CM | POA: Diagnosis not present

## 2022-05-10 NOTE — Progress Notes (Signed)
The patient is a 67 year old female that I am seeing for the first time as it relates to her right shoulder.  She was actually recently hospitalized with altered mental status changes and a UTI but had been complaining of right shoulder pain.  She is a patient who has rheumatoid disease as well.  A MRI was obtained while she was in the hospital of her right shoulder and that is on the canopy and epic system for me to review.  She does mainly get around in a power mobility device.  On exam her right shoulder shows no blocks or rotation.  She actually abducts her shoulder appropriately using her rotator cuff and not her deltoids.  She does seem to be painful when I put her through the arc of motion of the shoulder.  The MRI of her right shoulder and plain films were reviewed.  She does have significant subdeltoid and subacromial bursitis of the shoulder.  There is no muscle atrophy and there is no tearing of the rotator cuff.  There is tendinosis with thickening of the cuff and some mild AC joint arthritis.  I talked to her about conservative treatment for the shoulder including steroid injection but she reports to me that a few days ago she did have a steroid injection in her right shoulder and she pointed to the posterior aspect of her shoulders where she got the injection.  She cannot provide me any information about who did the injection or where this was performed in terms of the clinic and I have no notes to see in epic about this.  With that being said, I would not recommend any further treatment until seeing what this injection does for her.  It is only been a few days so it still certainly could kick can and she does not appear uncomfortable or in any acute distress.  We can certainly see her back in 4 weeks to see how she is doing overall.

## 2022-05-12 ENCOUNTER — Ambulatory Visit: Payer: 59 | Attending: Internal Medicine | Admitting: Physical Therapy
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
## 2022-05-14 ENCOUNTER — Encounter: Payer: Self-pay | Admitting: Nurse Practitioner
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
## 2022-05-17 ENCOUNTER — Other Ambulatory Visit: Payer: Self-pay | Admitting: Nurse Practitioner

## 2022-05-17 DIAGNOSIS — Z1231 Encounter for screening mammogram for malignant neoplasm of breast: Secondary | ICD-10-CM

## 2022-05-20 IMAGING — CT CT CHEST-ABD-PELV W/O CM
2 of 4 series · 14 of 36 positions shown, 16 images · non-contrast
Comparison: CT abdomen pelvis dated November 02, 2020. Chest x-ray
dated June 12, 2020. CT chest dated January 06, 2017.

CLINICAL DATA: Abdominal pain.

EXAM:
CT CHEST, ABDOMEN AND PELVIS WITHOUT CONTRAST
TECHNIQUE: Multidetector CT imaging of the chest, abdomen and pelvis was
performed following the standard protocol without IV contrast.

[Series 3: cap wo 5.0 i31f 2 · axial · 0.93mm/px · z∈[-653,-83]mm · 11 of 134 slices shown, 13 images]
[im 10/134  mediastinal]
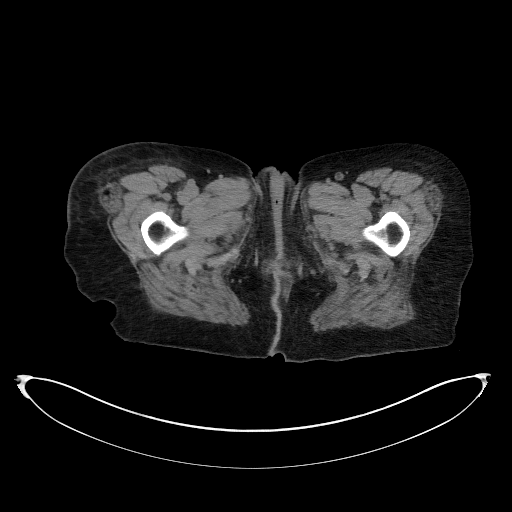
[im 10/134  bone]
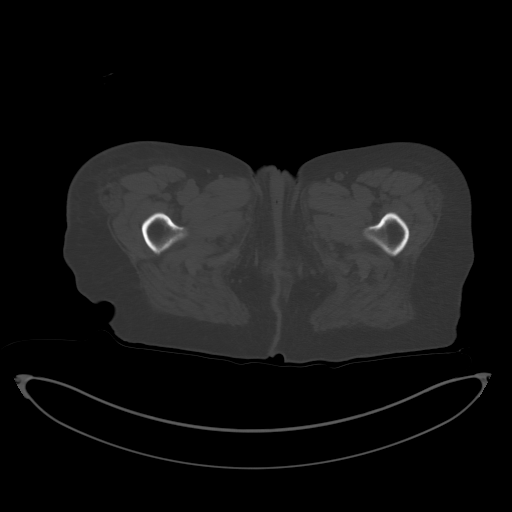
[im 20/134  mediastinal]
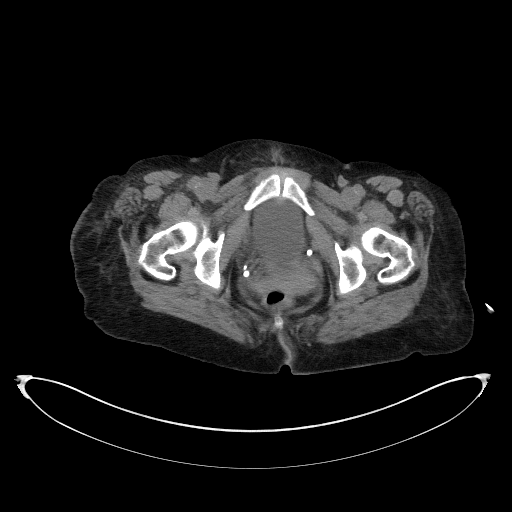
[im 29/134  mediastinal]
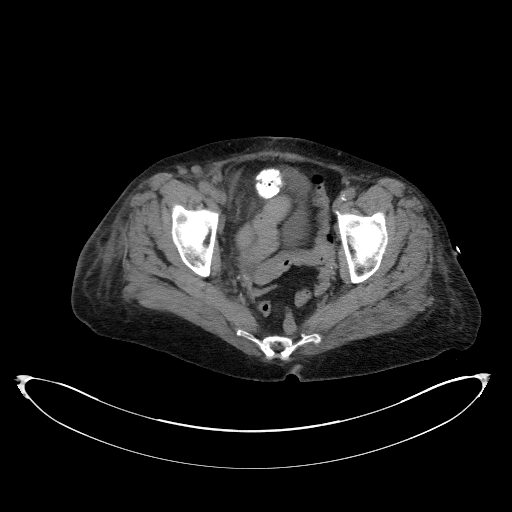
[im 48/134  mediastinal]
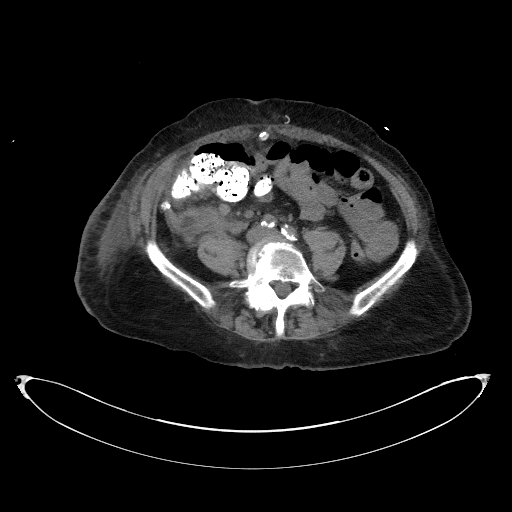
[im 58/134  mediastinal]
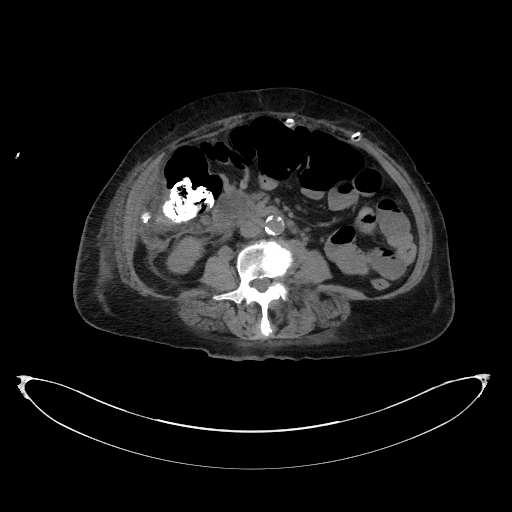
[im 67/134  mediastinal]
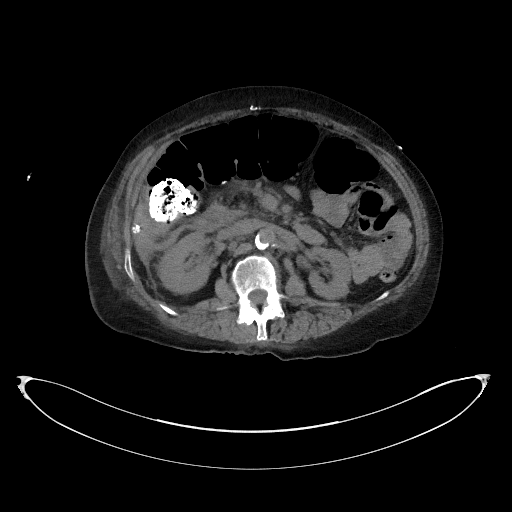
[im 77/134  mediastinal]
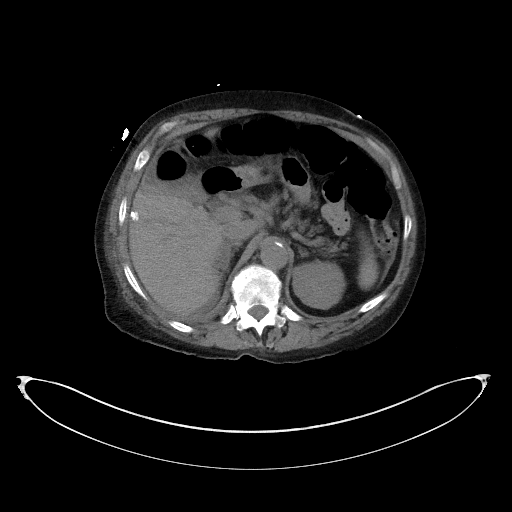
[im 86/134  mediastinal]
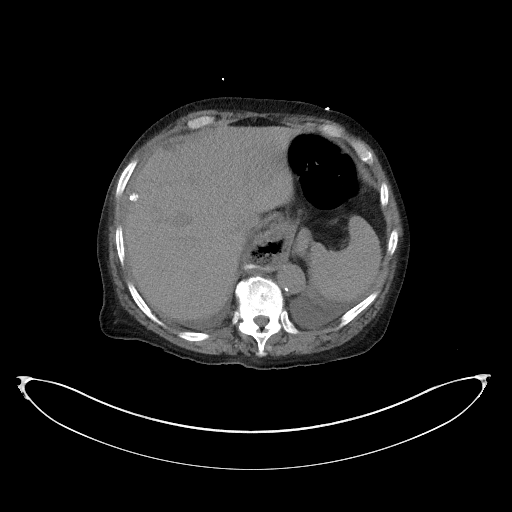
[im 105/134  mediastinal]
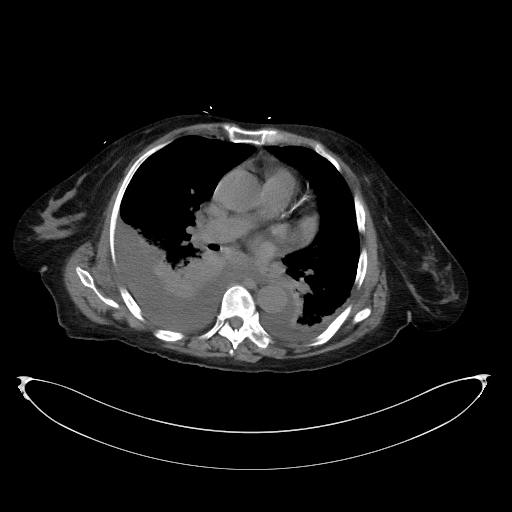
[im 105/134  bone]
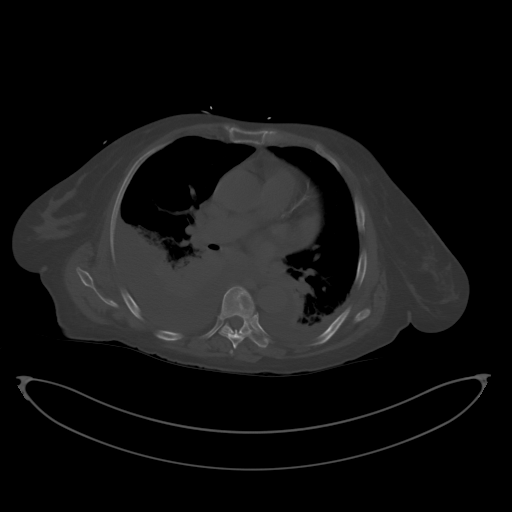
[im 115/134  mediastinal]
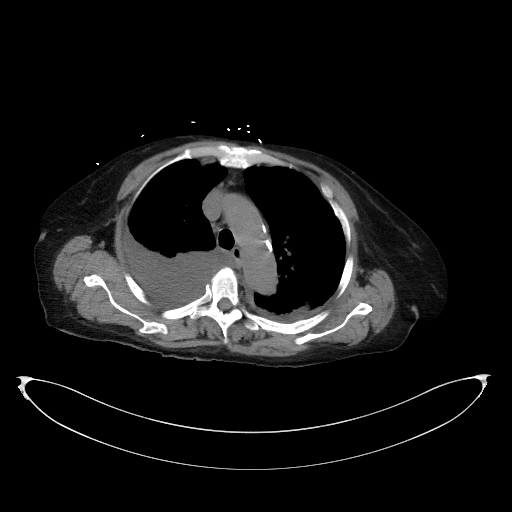
[im 124/134  mediastinal]
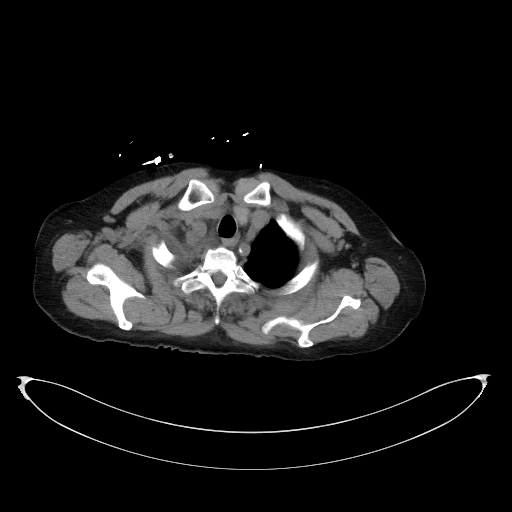

[Series 6: coronal · coronal · 0.76mm/px · 3 of 137 slices shown]
[im 28/137  mediastinal]
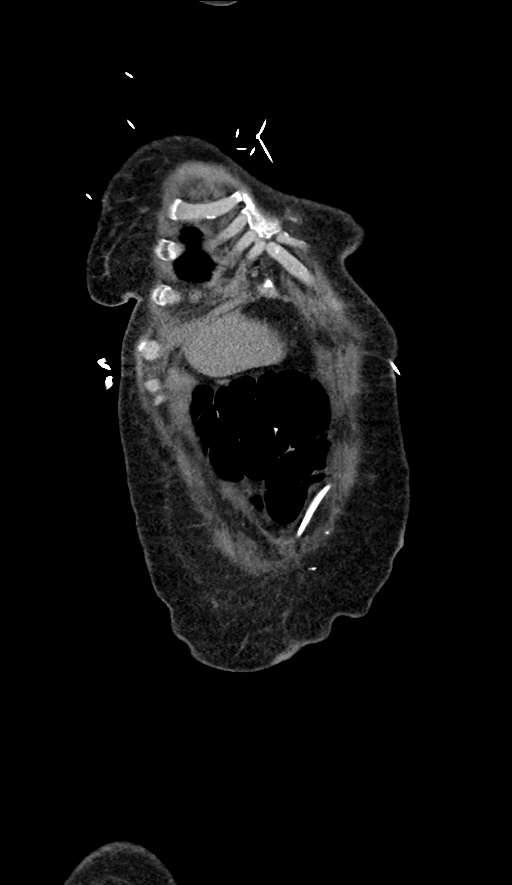
[im 55/137  mediastinal]
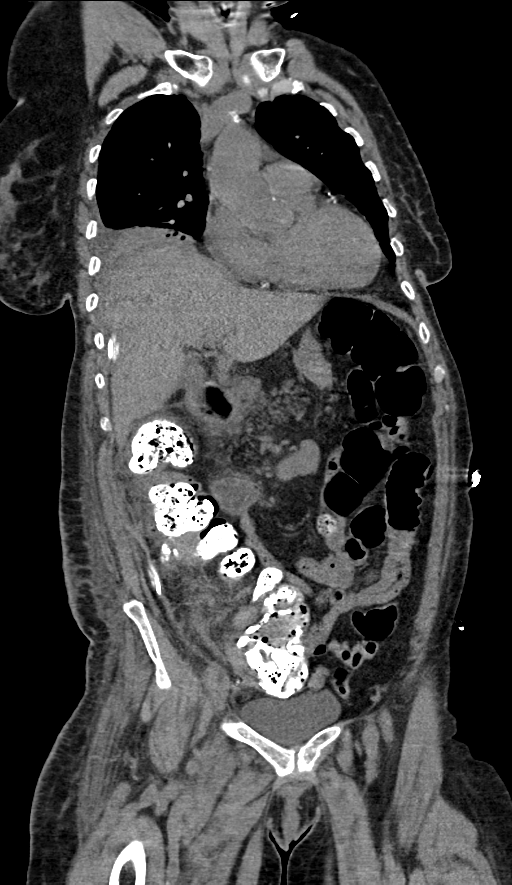
[im 82/137  mediastinal]
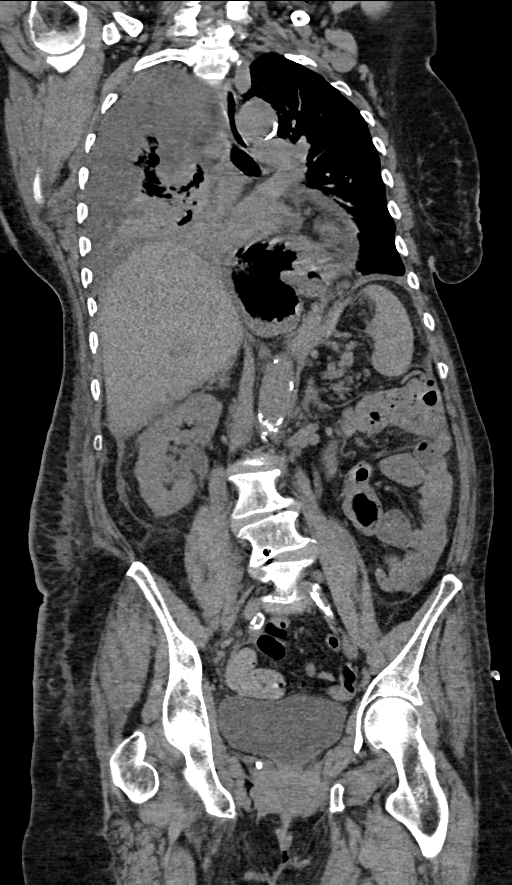

[14 of 36 positions shown; findings below may reference images not displayed]

FINDINGS: CT CHEST FINDINGS

Cardiovascular: Normal heart size. New trace pericardial effusion.
No thoracic aortic aneurysm. Coronary, aortic arch, and branch
vessel atherosclerotic vascular disease.

Mediastinum/Nodes: No enlarged mediastinal, hilar, or axillary lymph
nodes. Thyroid gland, trachea, and esophagus demonstrate no
significant findings. Large hiatal hernia again noted.

Lungs/Pleura: Increased moderate right and small left pleural
effusions. Complete collapse of the right lower lobe. Dependent
atelectasis in the left lower lobe, right upper lobe, and right
middle lobe. Mild centrilobular emphysema. Scattered smooth mild
interlobular septal thickening. No pneumothorax.

Musculoskeletal: No acute or significant osseous findings. Old
left-sided rib fractures.

CT ABDOMEN PELVIS FINDINGS

Hepatobiliary: Unchanged small cyst in the peripheral left hepatic
lobe. No new focal liver abnormality. Normal gallbladder. No biliary
dilatation.

Pancreas: Similar atrophy without ductal dilatation or surrounding
inflammatory change.

Spleen: Normal in size without focal abnormality.

Adrenals/Urinary Tract: Adrenal glands are unremarkable. Unchanged
small left renal simple cyst. No renal calculi or hydronephrosis.
The bladder is unremarkable.

Stomach/Bowel: Unchanged large hiatal hernia. No bowel wall
thickening, distention, or surrounding inflammatory changes. Oral
contrast in the colon. Interval appendectomy.

Vascular/Lymphatic: Aortic atherosclerosis. No enlarged abdominal or
pelvic lymph nodes.

Reproductive: Status post hysterectomy. No adnexal masses.

Other: New surgical drain in the right abdomen. Resolved
pneumoperitoneum. Ill-defined fluid and phlegmonous change in the
right abdomen measuring 1.7 x 2.6 x 3.4 cm (series 3, image 83;
series 6, image 74) without discrete drainable fluid collection.

Musculoskeletal: No acute or significant osseous findings.
IMPRESSION: 1. Interval appendectomy with ill-defined fluid and phlegmonous
change in the right abdomen without discrete drainable abscess.
2. Increased moderate right and small left pleural effusions with
complete collapse of the right lower lobe.
3. Aortic Atherosclerosis (KGOV2-DQT.T) and Emphysema (KGOV2-NUQ.C).

## 2022-05-24 IMAGING — DX DG CHEST 1V
1 series · 1 of 1 positions shown · non-contrast
Comparison: Previous studies including the examination done earlier
today

CLINICAL DATA: Status post right thoracentesis

EXAM:
CHEST  1 VIEW

[chest ap]
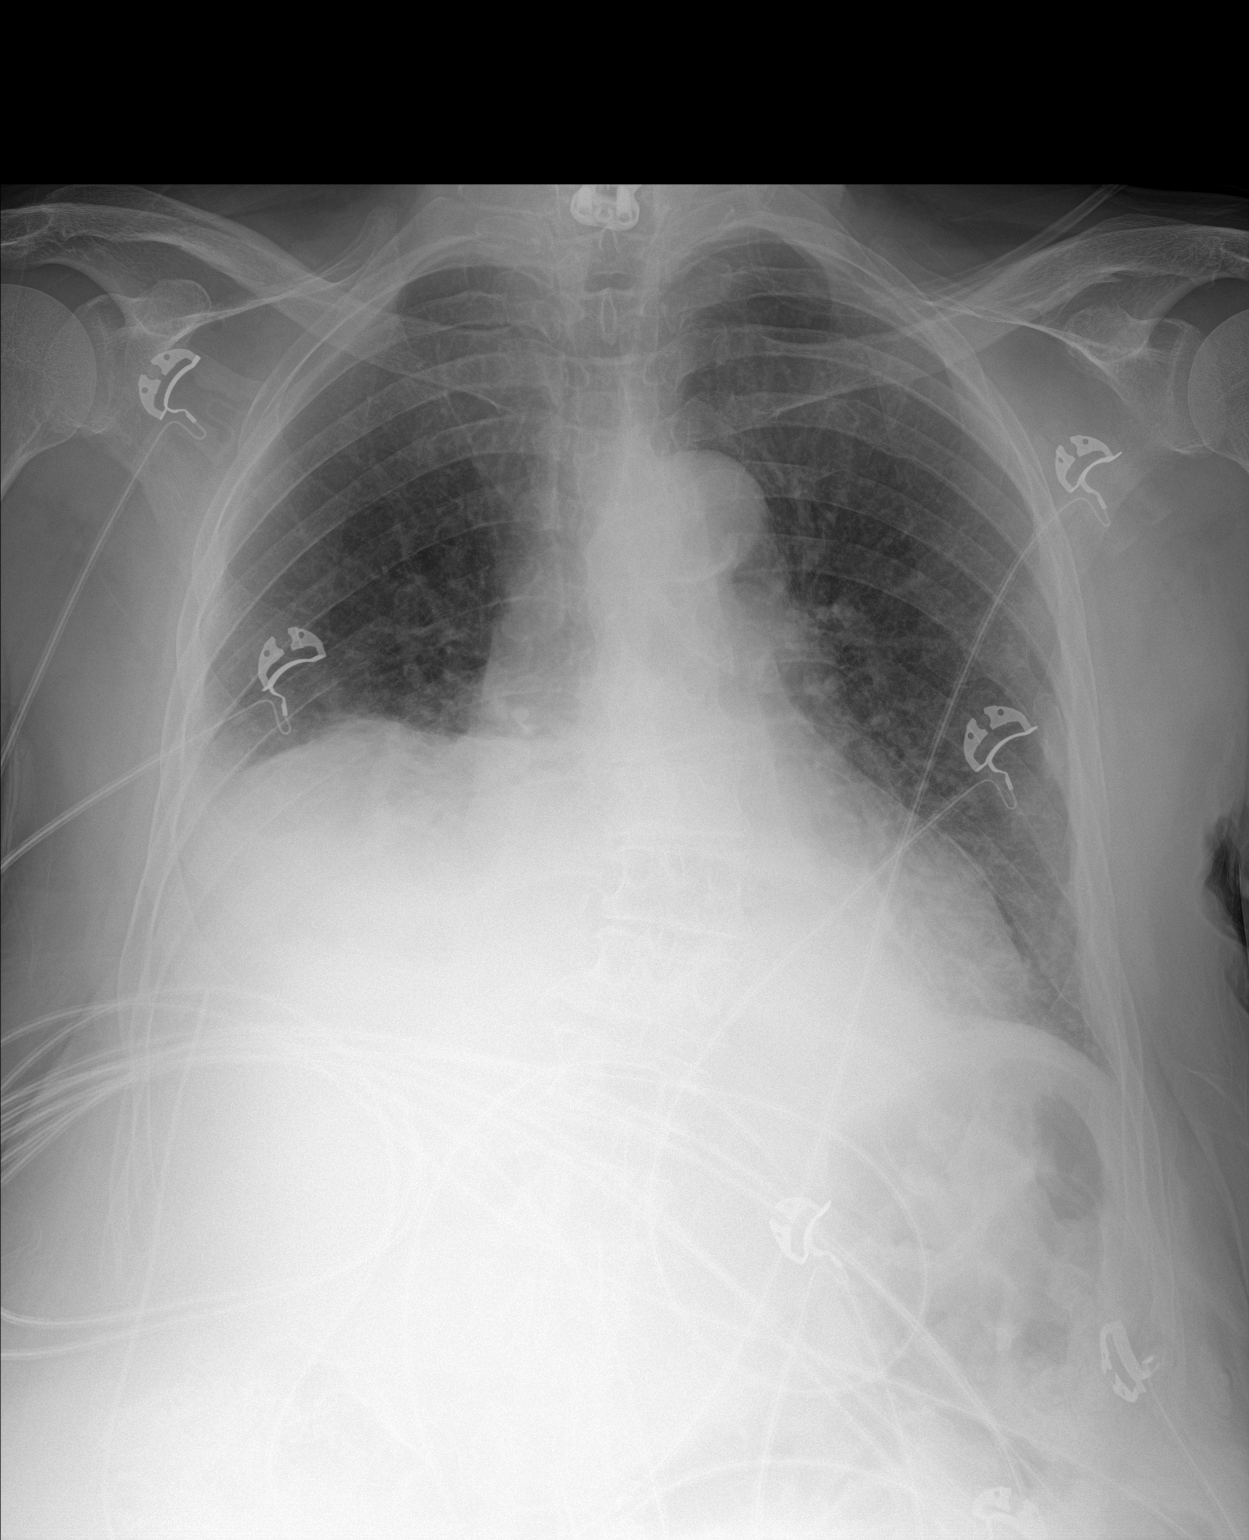

[1 of 1 positions shown; findings below may reference images not displayed]

FINDINGS: Transverse diameter of heart is increased. There are no signs of
pulmonary edema. There is interval decrease in right pleural
effusion. There is no pneumothorax. There is residual increased
density in right lower lung fields suggesting residual pleural
effusion and underlying atelectasis/pneumonia. Increased density in
the medial left lower lung fields suggesting atelectasis. Minimal
left pleural effusion is seen.
IMPRESSION: There is significant interval decrease in right pleural effusion
after thoracentesis. There is no pneumothorax. There is moderate
residual right pleural effusion. Minimal left pleural effusion is
seen. Infiltrates are seen in both lower lung fields suggesting
atelectasis/pneumonia.

## 2022-05-24 IMAGING — US US THORACENTESIS ASP PLEURAL SPACE W/IMG GUIDE
1 series · 3 of 3 positions shown · non-contrast
Comparison: none

INDICATION: Patient with recent history of perforated appendicitis with sepsis
requiring surgical repair, dyspnea, right greater than left pleural
effusions. Request received for diagnostic and therapeutic right
thoracentesis.

[Series 1: us thoracentesis asp pleural space w/img guide · 3 of 3 slices shown]
[im 1/3]
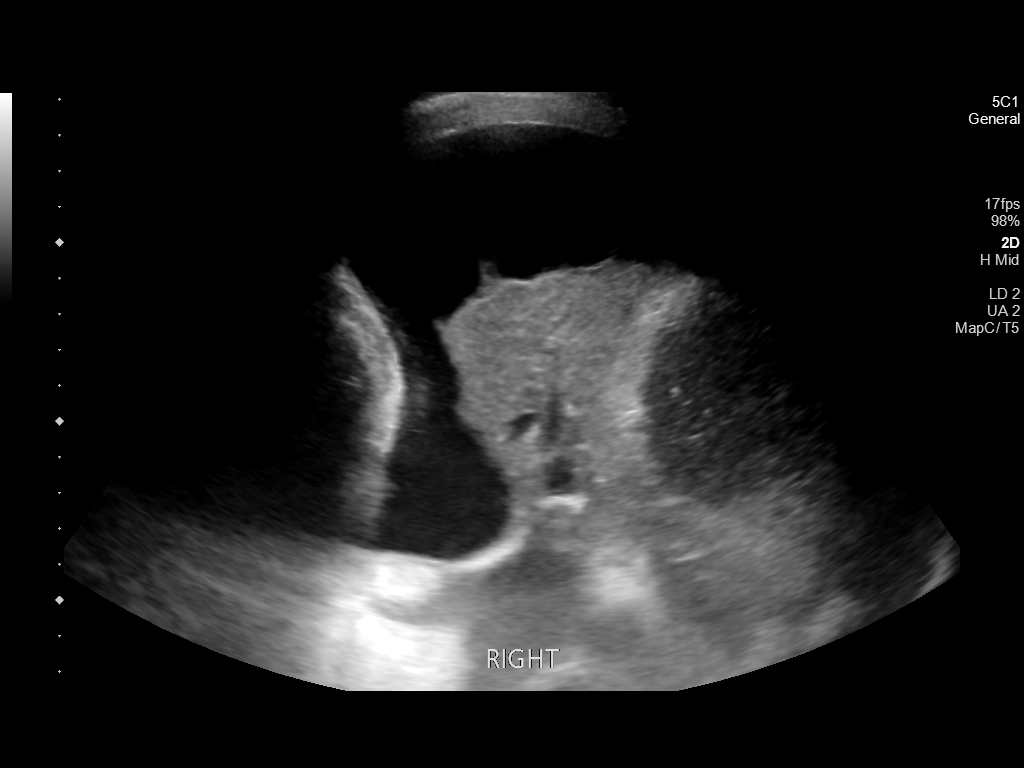
[im 2/3]
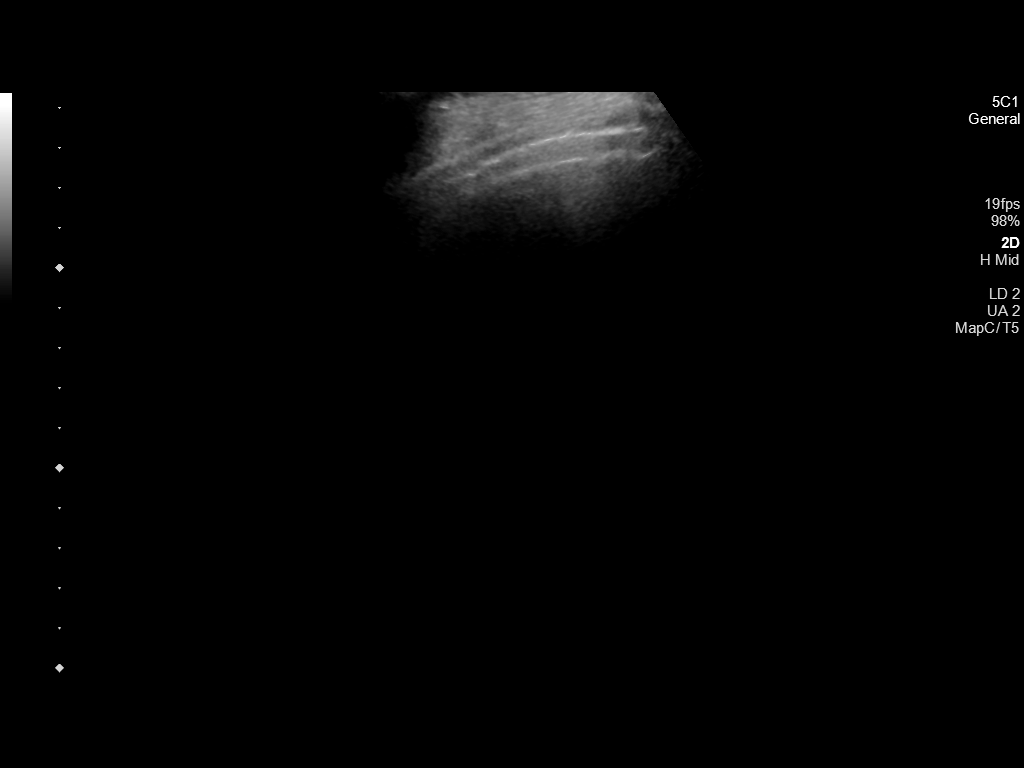
[im 3/3]
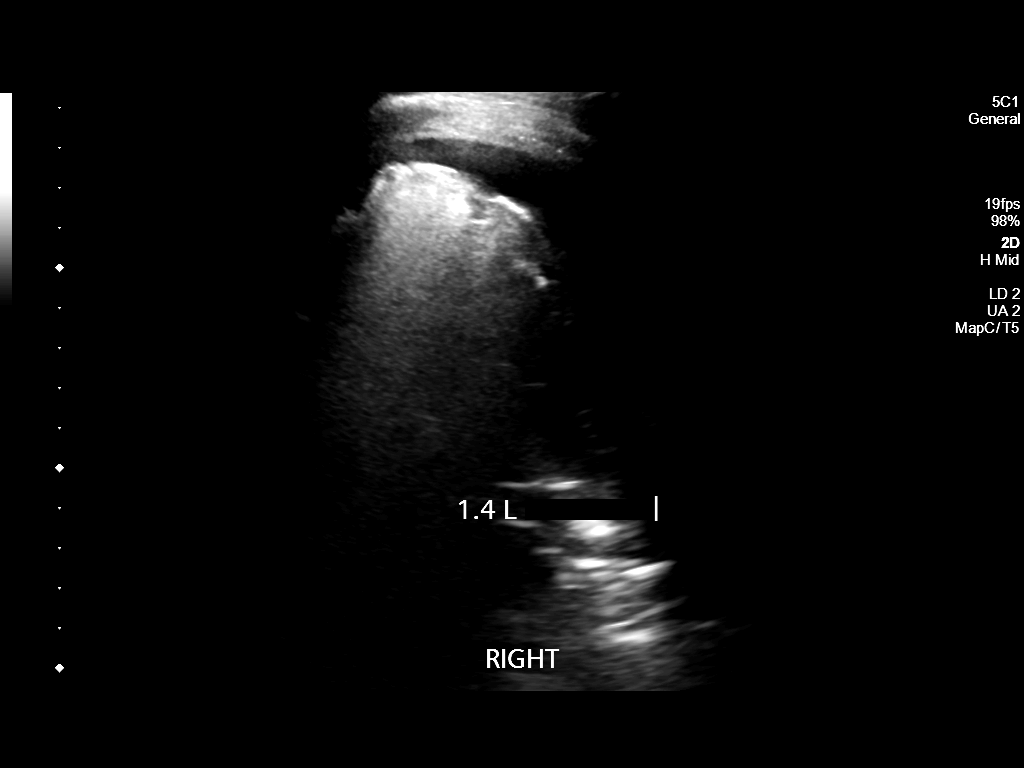

[3 of 3 positions shown; findings below may reference images not displayed]

EXAM:
ULTRASOUND GUIDED DIAGNOSTIC AND THERAPEUTIC RIGHT THORACENTESIS

MEDICATIONS:
10 mL 1% lidocaine

COMPLICATIONS:
None immediate.

PROCEDURE:
An ultrasound guided thoracentesis was thoroughly discussed with the
patient and questions answered. The benefits, risks, alternatives
and complications were also discussed. The patient understands and
wishes to proceed with the procedure. Written consent was obtained.

Ultrasound was performed to localize and mark an adequate pocket of
fluid in the right chest. The area was then prepped and draped in
the normal sterile fashion. 1% Lidocaine was used for local
anesthesia. Under ultrasound guidance a 6 Fr Safe-T-Centesis
catheter was introduced. Thoracentesis was performed. The catheter
was removed and a dressing applied.
FINDINGS: A total of approximately 1.4 liters of hazy, amber fluid was
removed. Samples were sent to the laboratory as requested by the
clinical team. Due to chest discomfort and some coughing only the
above amount of fluid was removed today.
IMPRESSION: Successful ultrasound guided diagnostic and therapeutic right
thoracentesis yielding 1.4 liters of pleural fluid.

## 2022-05-24 IMAGING — CR DG CHEST 1V PORT
1 series · 1 of 1 positions shown · non-contrast
Comparison: Chest x-ray 06/12/2020, CT chest 11/07/2020

CLINICAL DATA: Shortness of breath chest pain

EXAM:
PORTABLE CHEST 1 VIEW

[x chest ap]
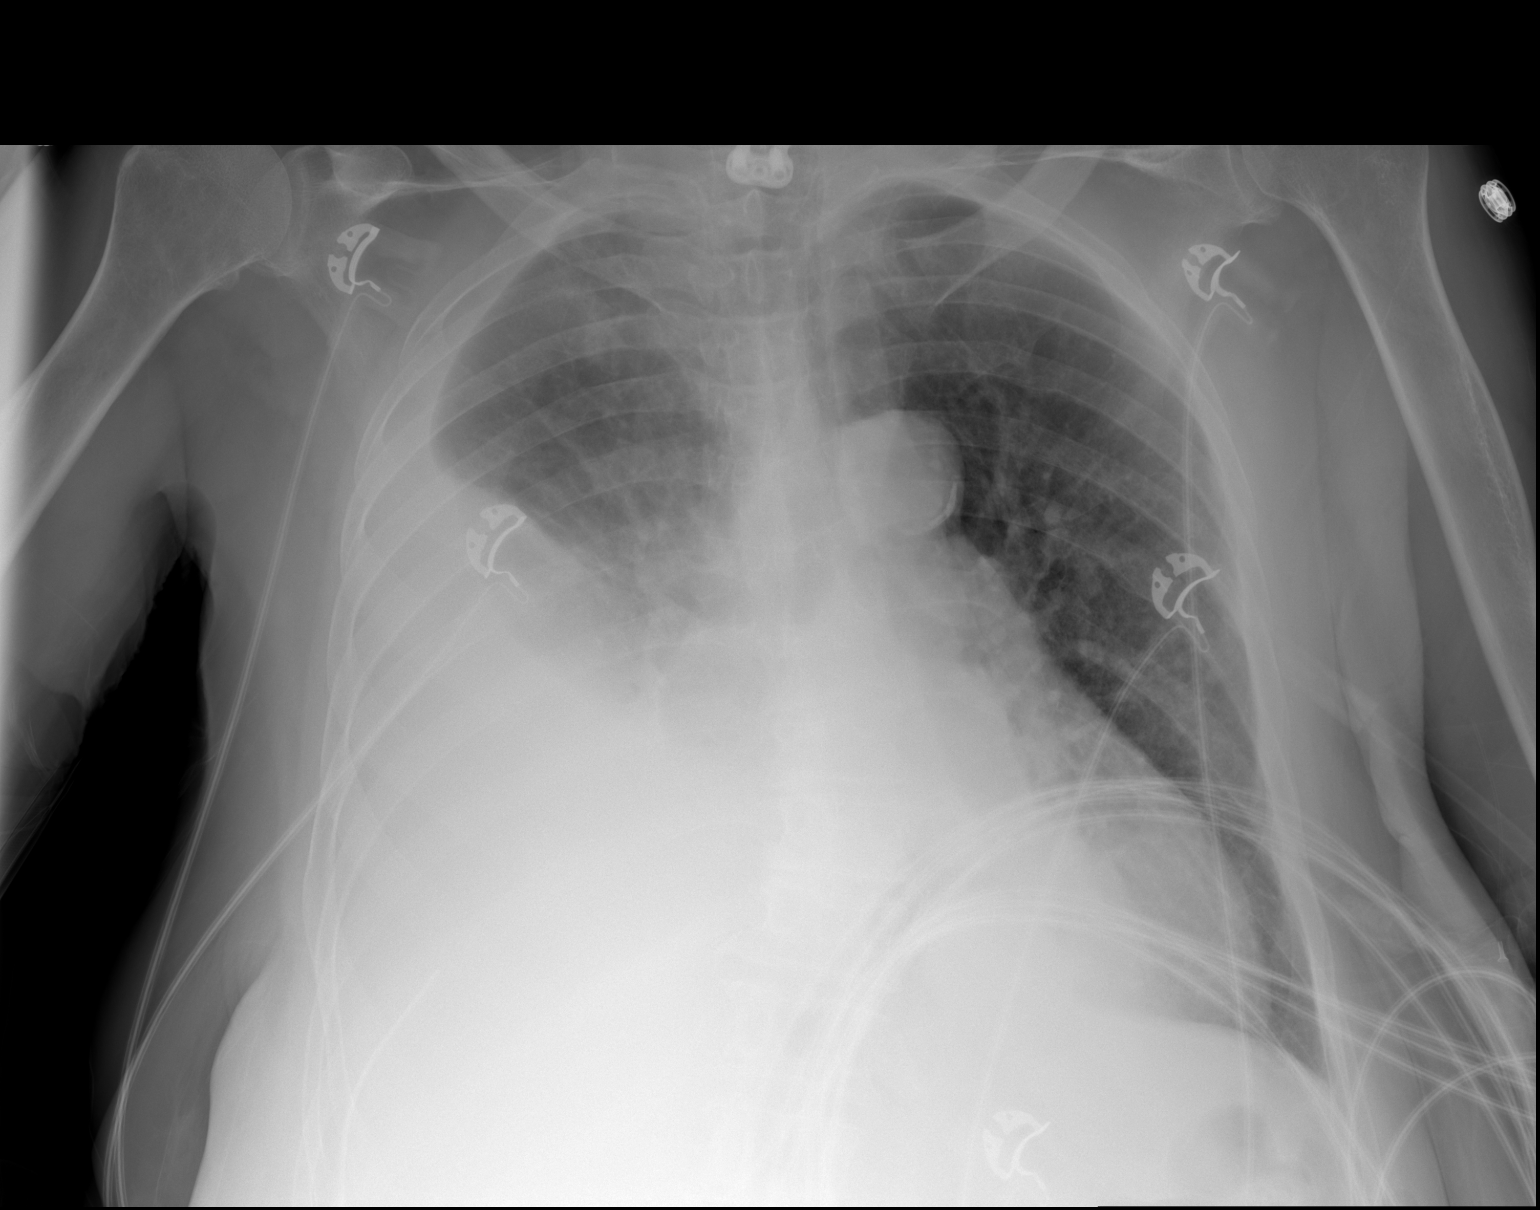

[1 of 1 positions shown; findings below may reference images not displayed]

FINDINGS: Heart is enlarged. Mediastinum appears stable. Calcified plaques in
the aortic arch. Pulmonary vasculature appears within normal limits.
Persistent large right pleural effusion with associated atelectasis.
No significant effusion visualized on the left. No pneumothorax
identified.
IMPRESSION: 1. Large right pleural effusion with associated atelectasis.
2. Cardiomegaly.

## 2022-05-24 IMAGING — CT CT ABD-PELV W/O CM
2 of 4 series · 15 of 46 positions shown, 17 images · non-contrast
Comparison: November 07, 2020.

CLINICAL DATA: Acute generalized abdominal pain.

EXAM:
CT ABDOMEN AND PELVIS WITHOUT CONTRAST
TECHNIQUE: Multidetector CT imaging of the abdomen and pelvis was performed
following the standard protocol without IV contrast.

[Series 2: axial st · axial · 0.83mm/px · z∈[+1001,+1411]mm · 12 of 92 slices shown, 14 images]
[im 5/92  soft-tissue]
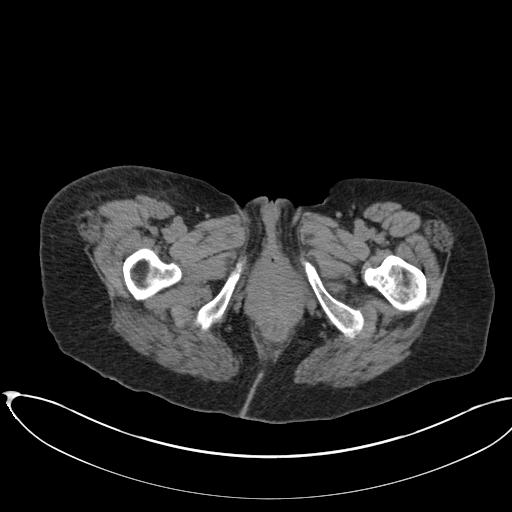
[im 5/92  bone]
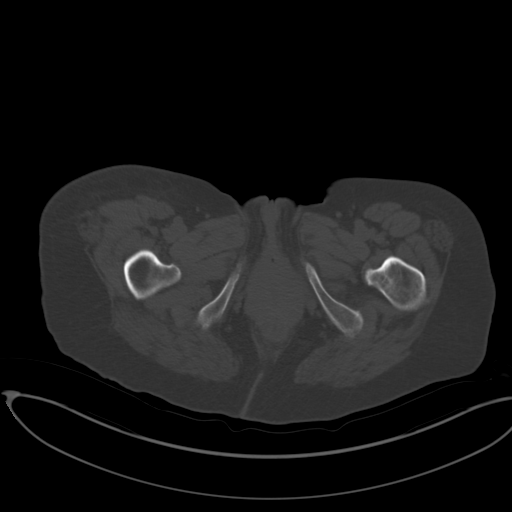
[im 15/92  soft-tissue]
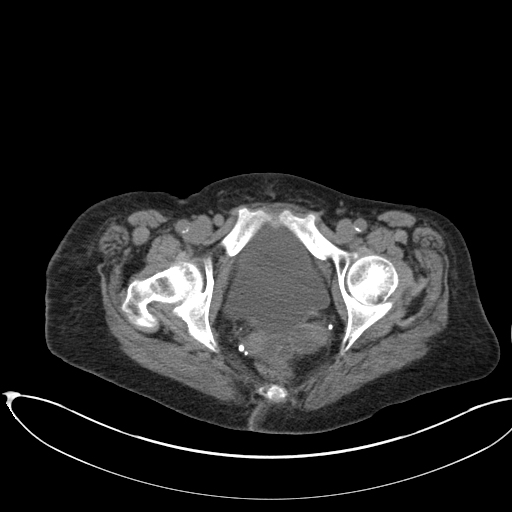
[im 20/92  soft-tissue]
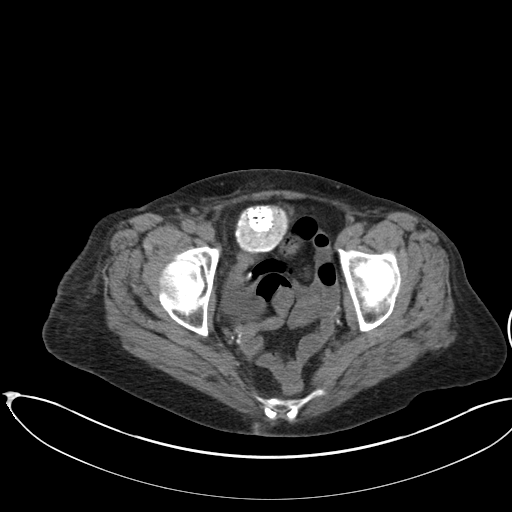
[im 29/92  soft-tissue]
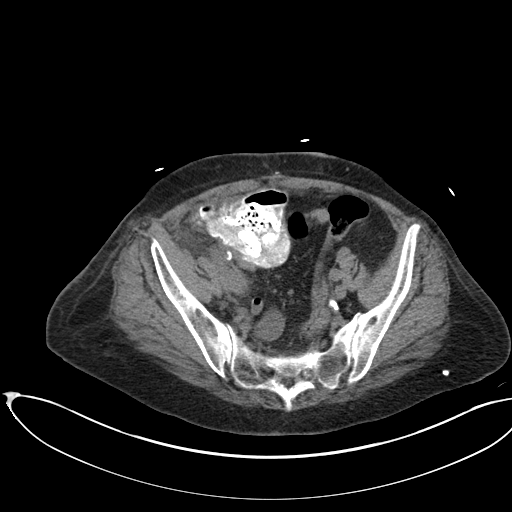
[im 34/92  soft-tissue]
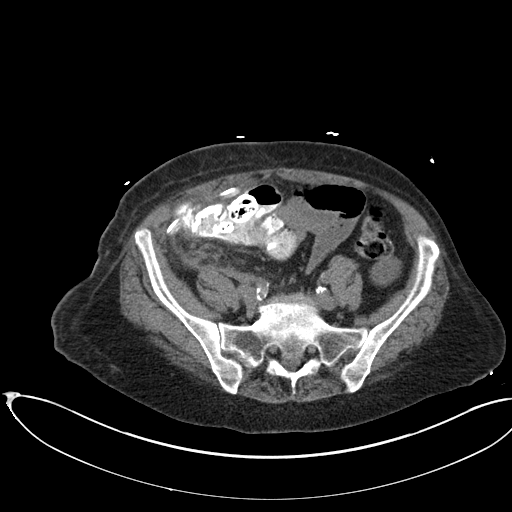
[im 44/92  soft-tissue]
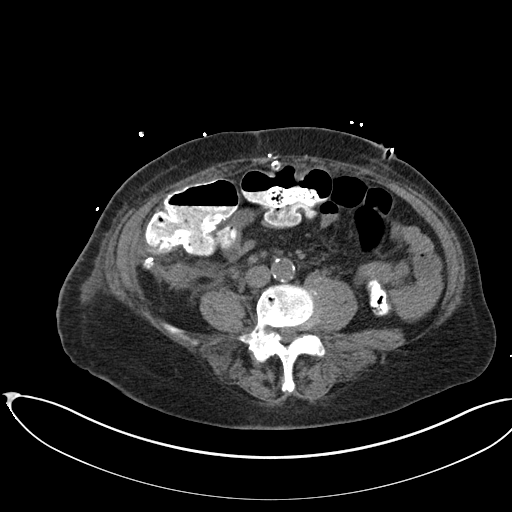
[im 48/92  soft-tissue]
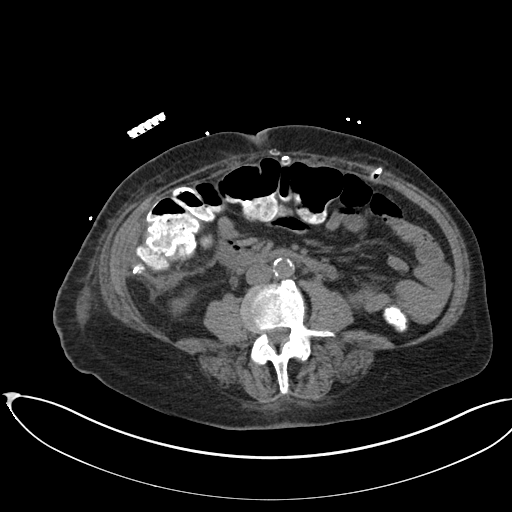
[im 58/92  soft-tissue]
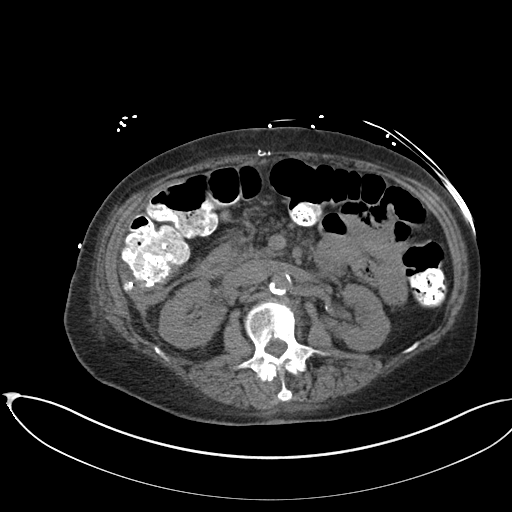
[im 63/92  soft-tissue]
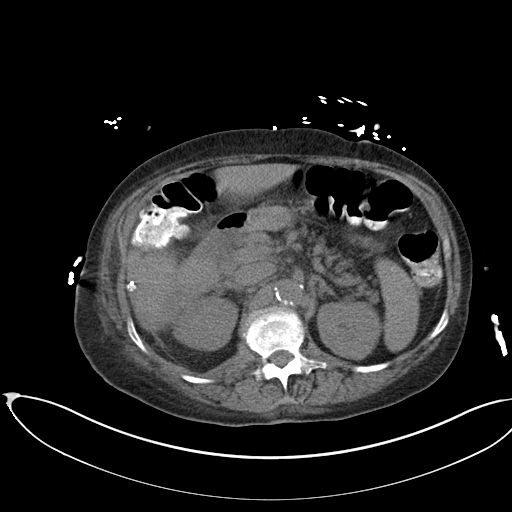
[im 63/92  bone]
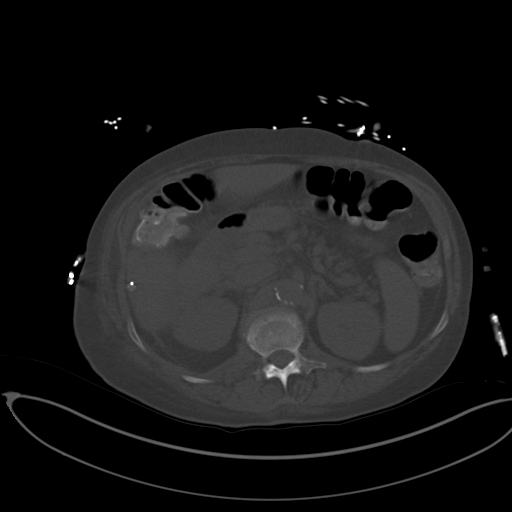
[im 72/92  soft-tissue]
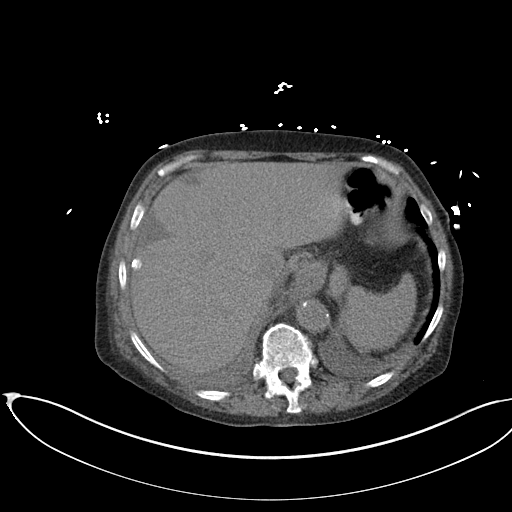
[im 77/92  soft-tissue]
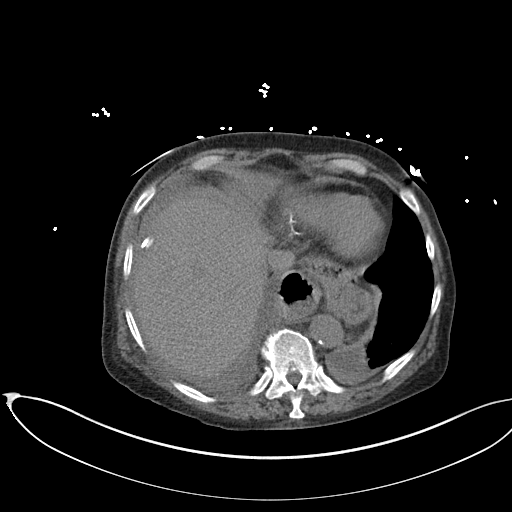
[im 87/92  soft-tissue]
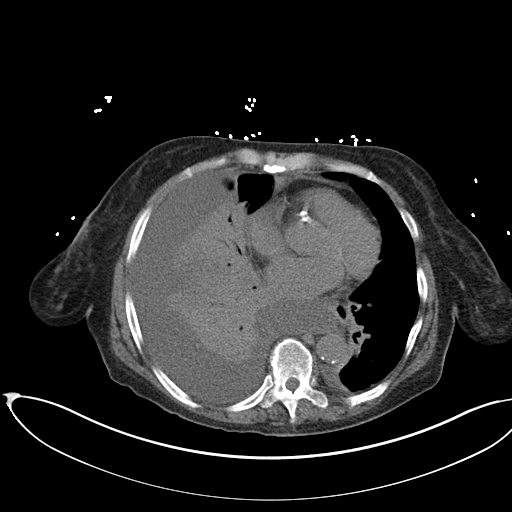

[Series 4: coronal st · coronal · 0.72mm/px · 3 of 126 slices shown]
[im 42/126  soft-tissue]
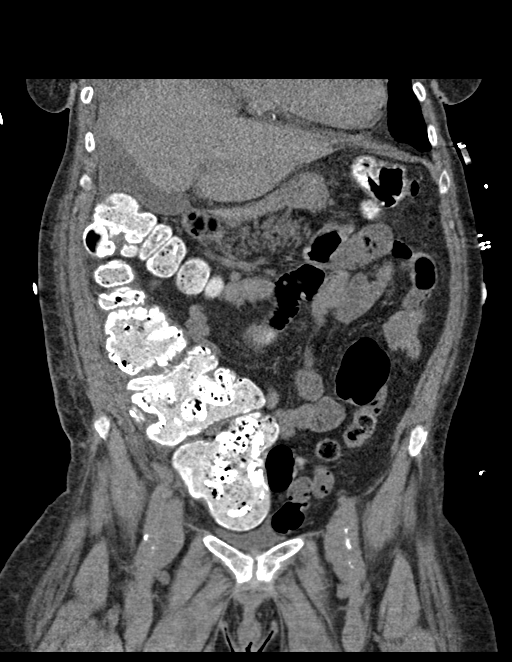
[im 56/126  soft-tissue]
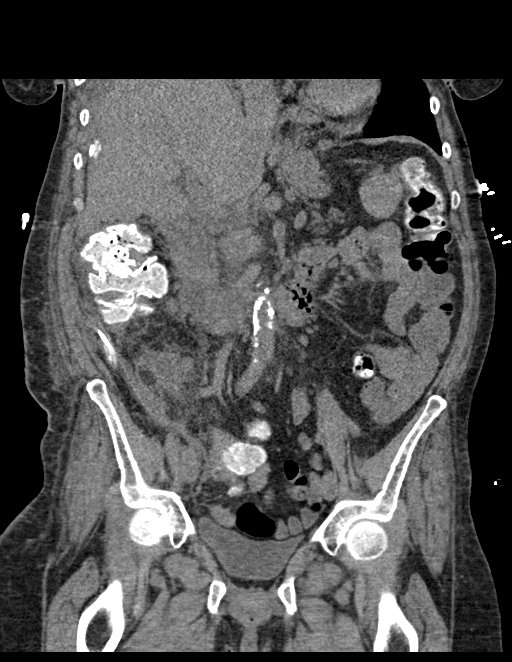
[im 70/126  soft-tissue]
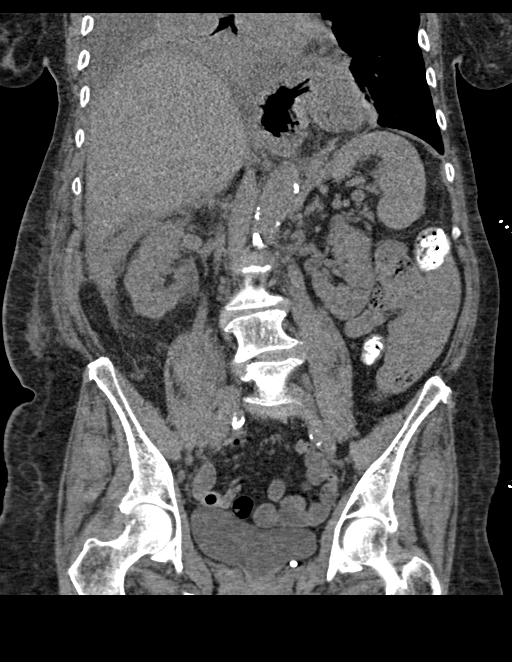

[15 of 46 positions shown; findings below may reference images not displayed]

FINDINGS: Lower chest: Large right pleural effusion is noted with associated
atelectasis of the right lower lobe and right middle lobe. Minimal
left pleural effusion is noted.

Hepatobiliary: No focal liver abnormality is seen. No gallstones,
gallbladder wall thickening, or biliary dilatation.

Pancreas: Unremarkable. No pancreatic ductal dilatation or
surrounding inflammatory changes.

Spleen: Normal in size without focal abnormality.

Adrenals/Urinary Tract: Adrenal glands are unremarkable. Kidneys are
normal, without renal calculi, focal lesion, or hydronephrosis.
Bladder is unremarkable.

Stomach/Bowel: The stomach appears normal. There is no evidence of
bowel obstruction. Status post appendectomy. Expected postsurgical
changes are noted in the right lower quadrant. There is again noted
a surgical drain which enters the left side of the abdomen with
distal tip in the right upper quadrant adjacent to the right hepatic
lobe.

Vascular/Lymphatic: Aortic atherosclerosis. No enlarged abdominal or
pelvic lymph nodes.

Reproductive: Status post hysterectomy. No adnexal masses.

Other: Minimal amount of free fluid is noted around the liver. No
definite hernia is noted. Grossly stable ill-defined fluid and
phlegmonous density seen in right lower quadrant measuring 2.6 x
cm currently.

Musculoskeletal: No acute or significant osseous findings.
IMPRESSION: Large right pleural effusion is noted which is increased compared to
prior exam, with associated atelectasis of the right lower lobe and
right middle lobe. Minimal left pleural effusion is noted.

Status post appendectomy with expected postsurgical changes seen in
the right lower quadrant. Stable size and appearance of ill-defined
fluid and phlegmonous density seen in right lower quadrant. Stable
position of surgical drain is noted with tip adjacent to the right
hepatic lobe, with small amount of free fluid noted around the
liver.

Aortic Atherosclerosis ([id number]).
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
## 2022-05-28 IMAGING — DX DG CHEST 1V PORT
1 series · 1 of 1 positions shown · non-contrast
Comparison: November 11, 2020

CLINICAL DATA: Pleural effusion.  Sternal pain.

EXAM:
PORTABLE CHEST 1 VIEW

[chest ap]
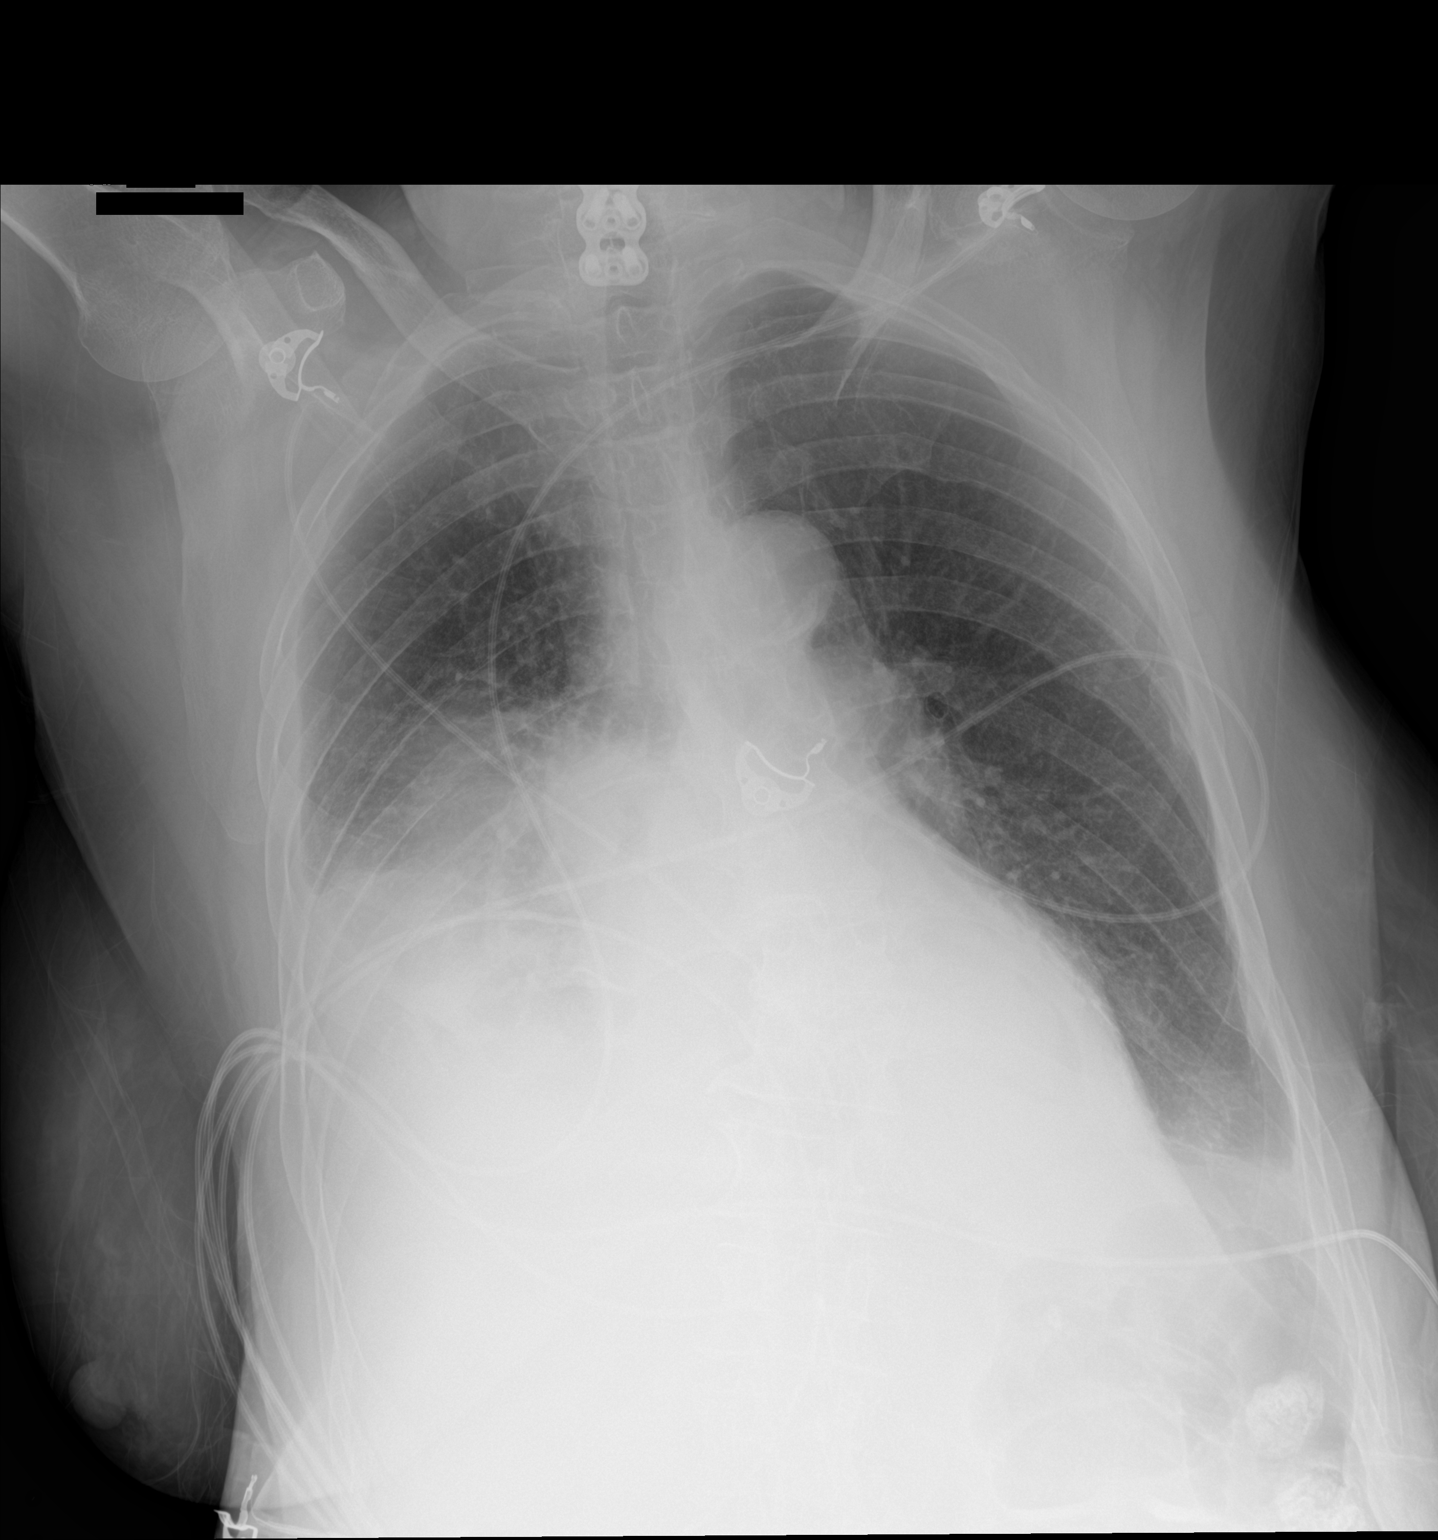

[1 of 1 positions shown; findings below may reference images not displayed]

FINDINGS: There appears to be a small layering left effusion with atelectasis.
There is a moderate right pleural effusion which has increased since
November 11, 2020. No pneumothorax. No other changes.
IMPRESSION: Small left effusion. Moderate right effusion worsened in the
interval. Opacity underlying the right effusion is likely
atelectasis. No other change.

## 2022-05-30 IMAGING — US US CHEST/MEDIASTINUM
1 series · 9 of 9 positions shown · non-contrast
Comparison: CT chest, 11/15/2020.  Chest radiograph, 11/15/2020.

CLINICAL DATA: Pleural effusion. Request for diagnostic
thoracentesis.

EXAM:
ULTRASOUND OF CHEST, LIMITED
TECHNIQUE: Ultrasound examination of the chest was performed in the area of
clinical concern, to assess for feasibility of diagnostic

[Series 1: us thoracentesis asp pleural s mc & wl · 9 of 9 slices shown]
[im 1/9]
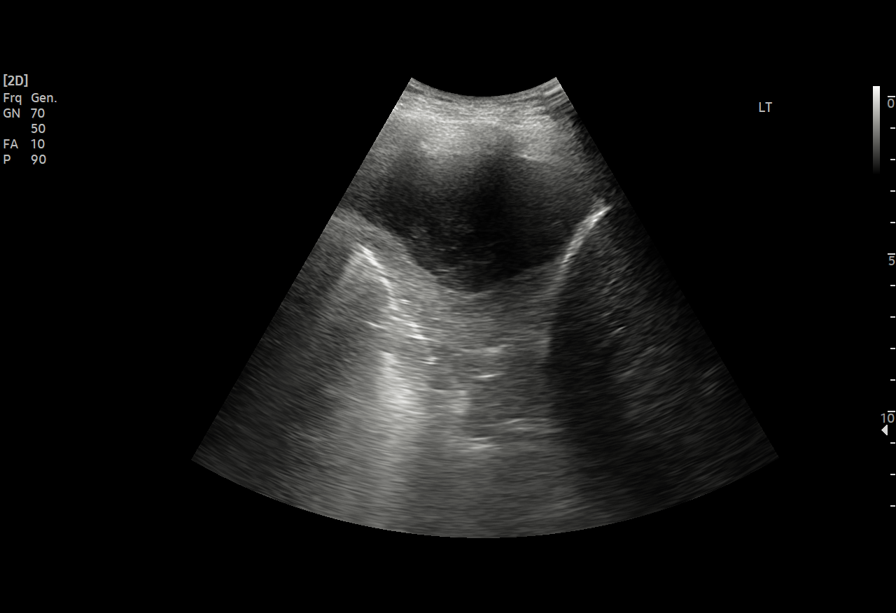
[im 2/9]
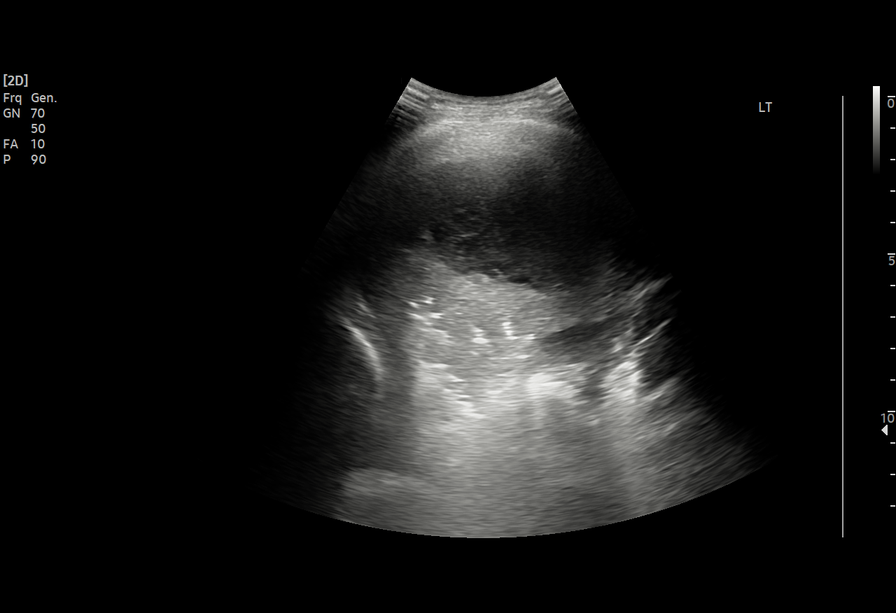
[im 3/9]
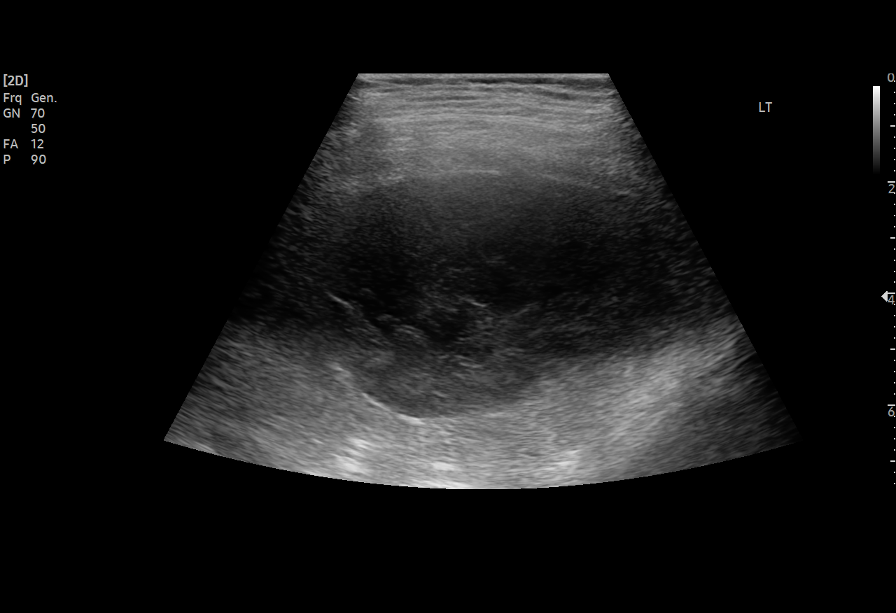
[im 4/9]
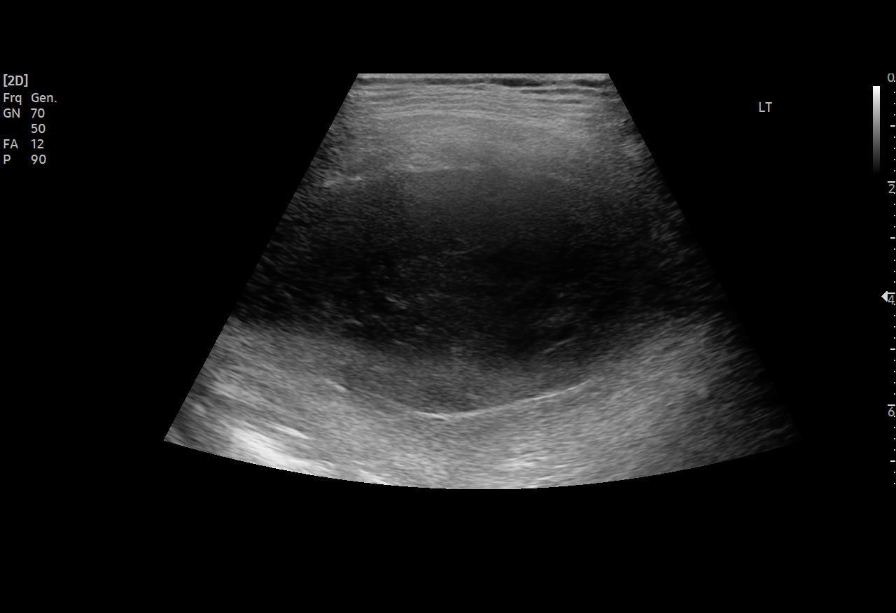
[im 5/9]
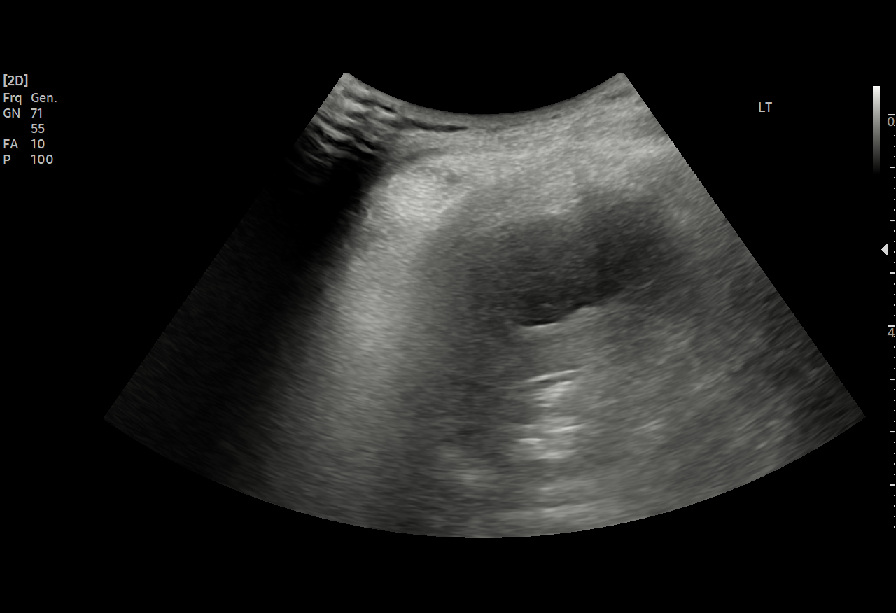
[im 6/9]
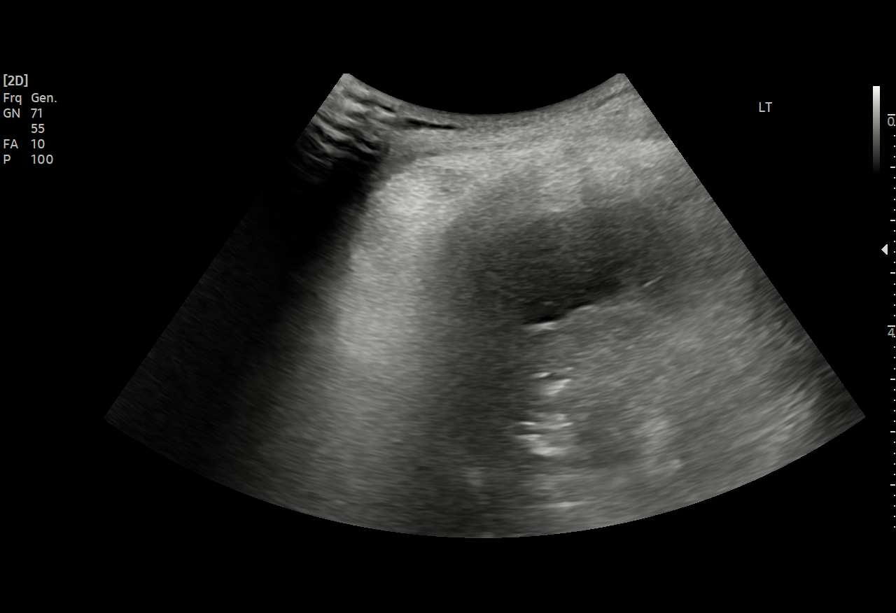
[im 7/9]
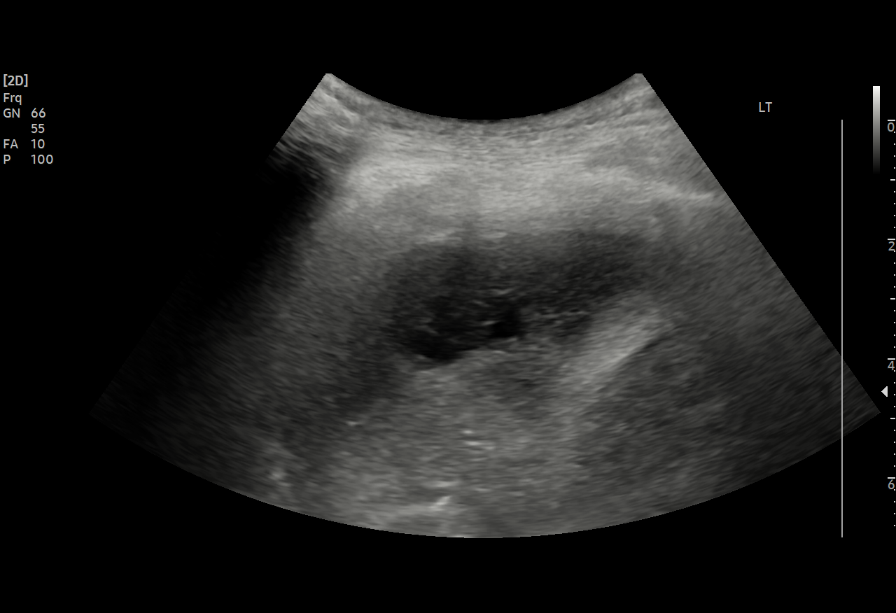
[im 8/9]
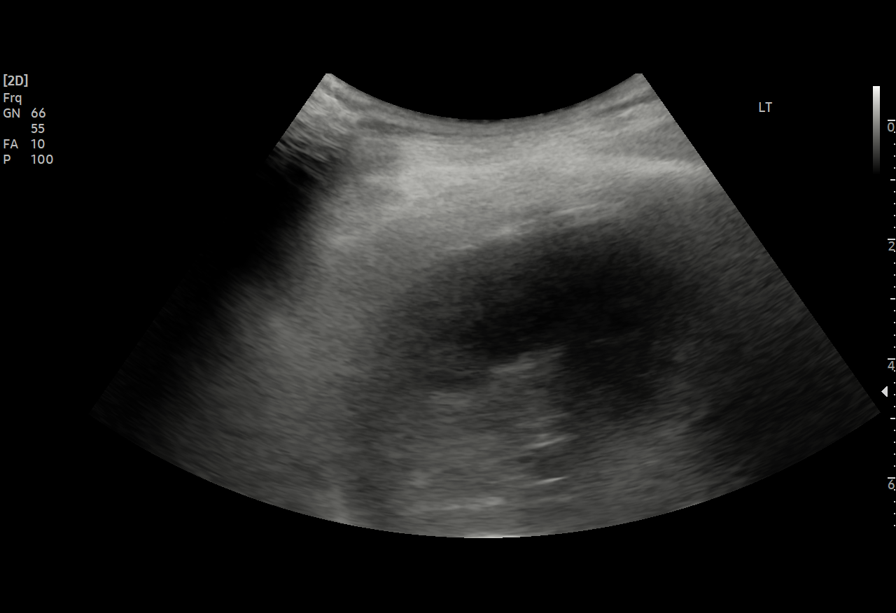
[im 9/9]
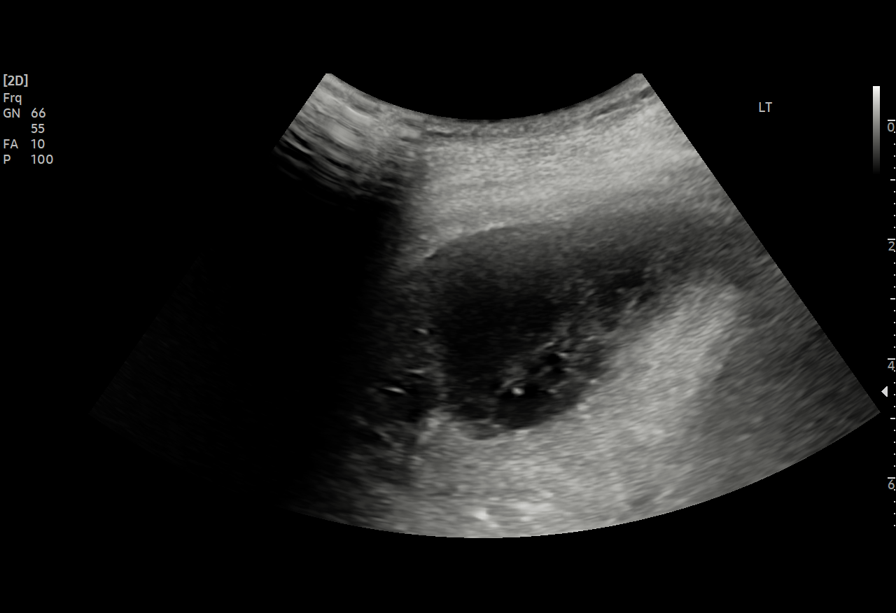

[9 of 9 positions shown; findings below may reference images not displayed]

FINDINGS: Heterogeneously echogenic RIGHT basilar chest consolidation without
discrete fluid collection.

Multiple sonographic images were obtained and saved to PACS.
Thoracentesis was aborted.
IMPRESSION: Heterogeneously echogenic RIGHT basilar chest consolidation without
discrete fluid collection.

Thoracentesis was aborted.

## 2022-06-02 ENCOUNTER — Ambulatory Visit: Payer: 59

## 2022-06-03 IMAGING — DX DG CHEST 1V PORT
1 series · 1 of 1 positions shown · non-contrast
Comparison: 11/15/2020

CLINICAL DATA: Chest pain, COPD, tobacco abuse

EXAM:
PORTABLE CHEST 1 VIEW

[chest ap]
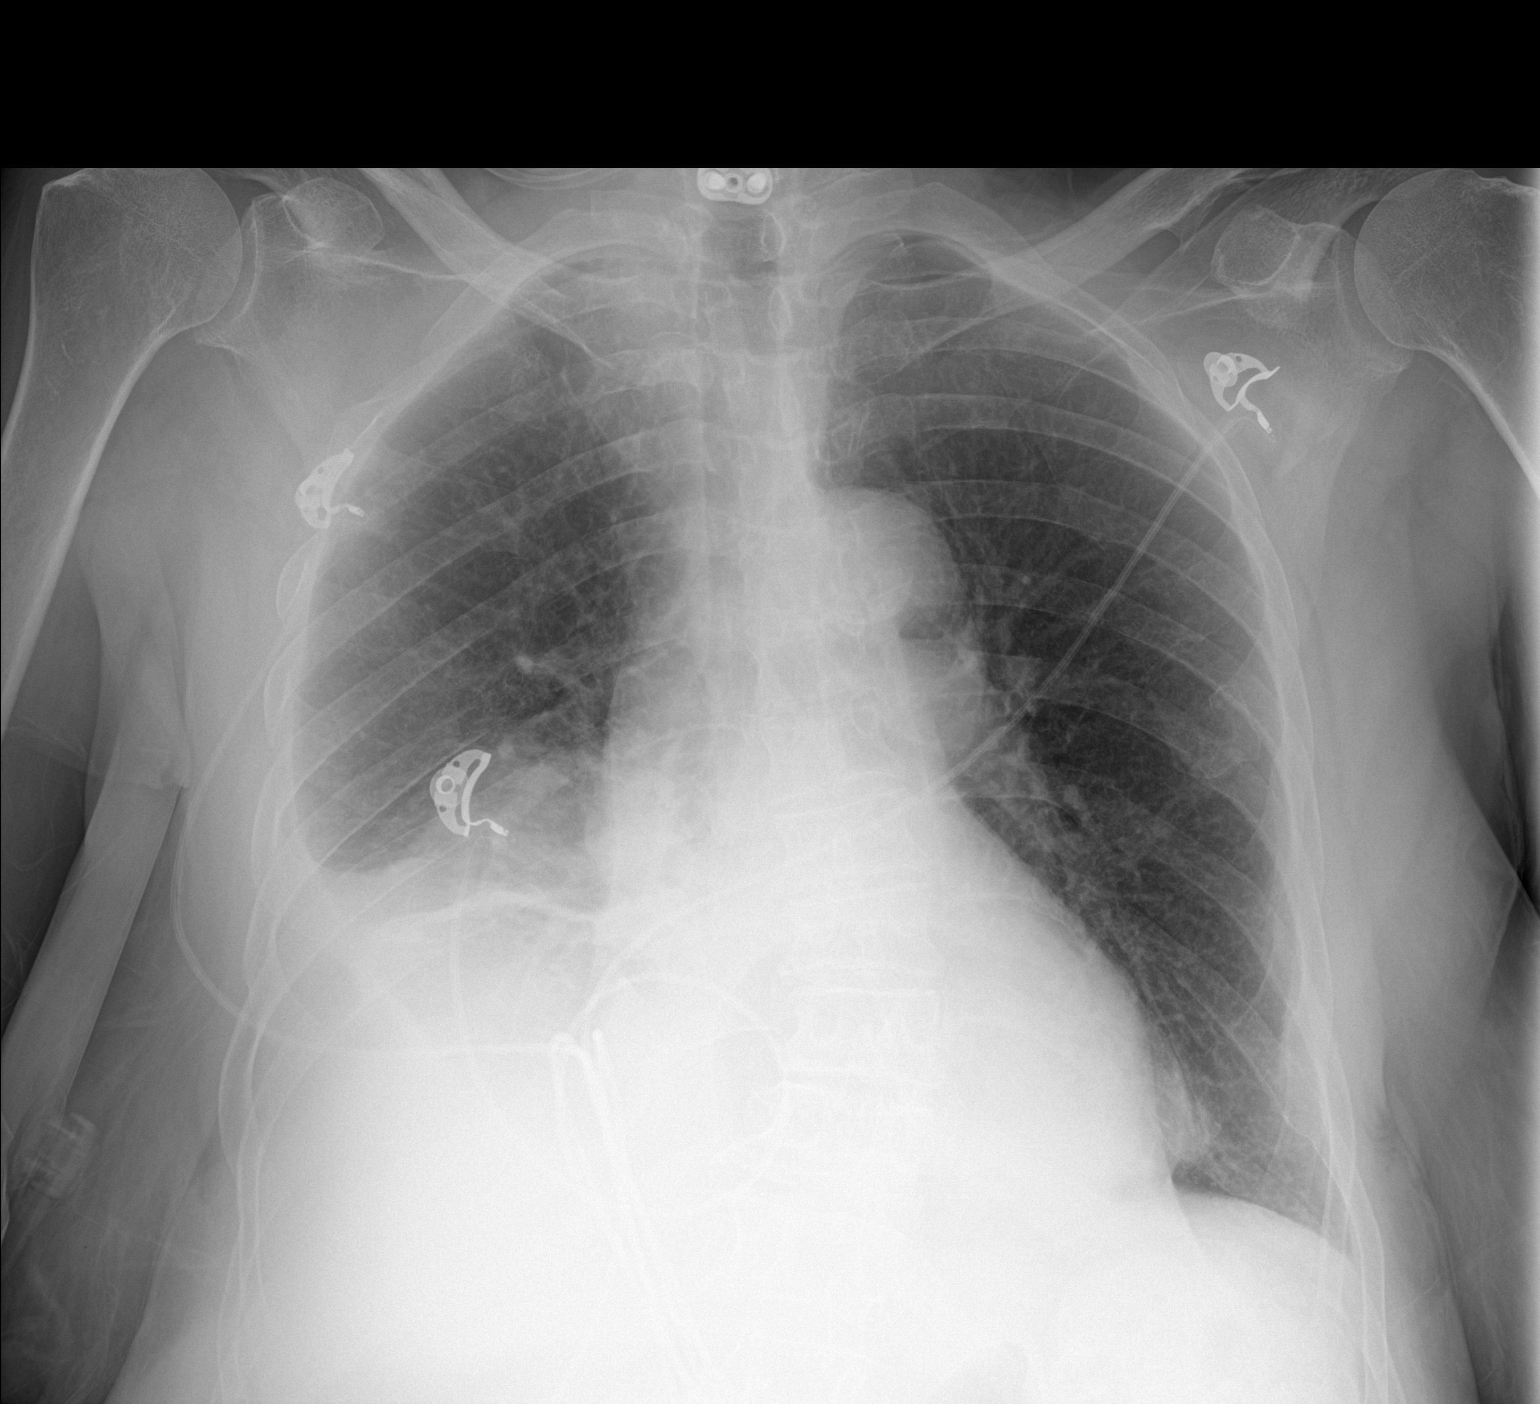

[1 of 1 positions shown; findings below may reference images not displayed]

FINDINGS: Moderate right pleural effusion is unchanged. Rounded opacity within
the right hilum corresponds to loculated fluid within the fissure
better seen on a prior CT examination of 11/15/2020. No
pneumothorax. No pleural effusion on the left. Moderate hiatal
hernia again noted. Cardiac size within normal limits. Pulmonary
vascularity is normal. No acute bone abnormality is seen.
IMPRESSION: Stable moderate right pleural effusion with associated right basilar
atelectasis.

Moderate hiatal hernia.

## 2022-06-05 ENCOUNTER — Emergency Department (HOSPITAL_COMMUNITY): Payer: 59

## 2022-06-05 ENCOUNTER — Emergency Department (HOSPITAL_COMMUNITY)
Admission: EM | Admit: 2022-06-05 | Discharge: 2022-06-05 | Disposition: A | Discharge status: Home / Self Care | Payer: 59 | Attending: Emergency Medicine | Admitting: Emergency Medicine

## 2022-06-05 ENCOUNTER — Other Ambulatory Visit: Payer: Self-pay

## 2022-06-05 ENCOUNTER — Encounter (HOSPITAL_COMMUNITY): Payer: Self-pay

## 2022-06-05 DIAGNOSIS — Z1152 Encounter for screening for COVID-19: Secondary | ICD-10-CM | POA: Diagnosis not present

## 2022-06-05 DIAGNOSIS — N183 Chronic kidney disease, stage 3 unspecified: Secondary | ICD-10-CM | POA: Diagnosis not present

## 2022-06-05 DIAGNOSIS — J441 Chronic obstructive pulmonary disease with (acute) exacerbation: Secondary | ICD-10-CM | POA: Diagnosis not present

## 2022-06-05 DIAGNOSIS — M542 Cervicalgia: Secondary | ICD-10-CM | POA: Diagnosis not present

## 2022-06-05 DIAGNOSIS — Z79899 Other long term (current) drug therapy: Secondary | ICD-10-CM | POA: Diagnosis not present

## 2022-06-05 DIAGNOSIS — R0602 Shortness of breath: Secondary | ICD-10-CM | POA: Diagnosis present

## 2022-06-05 DIAGNOSIS — I129 Hypertensive chronic kidney disease with stage 1 through stage 4 chronic kidney disease, or unspecified chronic kidney disease: Secondary | ICD-10-CM | POA: Diagnosis not present

## 2022-06-05 LAB — CBC
HCT: 39.6 % (ref 36.0–46.0)
Hemoglobin: 12.4 g/dL (ref 12.0–15.0)
MCH: 28.2 pg (ref 26.0–34.0)
MCHC: 31.3 g/dL (ref 30.0–36.0)
MCV: 90 fL (ref 80.0–100.0)
Platelets: 416 10*3/uL — ABNORMAL HIGH (ref 150–400)
RBC: 4.4 MIL/uL (ref 3.87–5.11)
RDW: 16.1 % — ABNORMAL HIGH (ref 11.5–15.5)
WBC: 9.9 10*3/uL (ref 4.0–10.5)
nRBC: 0 % (ref 0.0–0.2)

## 2022-06-05 LAB — RESP PANEL BY RT-PCR (RSV, FLU A&B, COVID)  RVPGX2
Influenza A by PCR: NEGATIVE
Influenza B by PCR: NEGATIVE
Resp Syncytial Virus by PCR: NEGATIVE
SARS Coronavirus 2 by RT PCR: NEGATIVE

## 2022-06-05 LAB — COMPREHENSIVE METABOLIC PANEL
ALT: 14 U/L (ref 0–44)
AST: 13 U/L — ABNORMAL LOW (ref 15–41)
Albumin: 3.6 g/dL (ref 3.5–5.0)
Alkaline Phosphatase: 107 U/L (ref 38–126)
Anion gap: 10 (ref 5–15)
BUN: 18 mg/dL (ref 8–23)
CO2: 21 mmol/L — ABNORMAL LOW (ref 22–32)
Calcium: 8.8 mg/dL — ABNORMAL LOW (ref 8.9–10.3)
Chloride: 109 mmol/L (ref 98–111)
Creatinine, Ser: 1.03 mg/dL — ABNORMAL HIGH (ref 0.44–1.00)
GFR, Estimated: 59 mL/min — ABNORMAL LOW (ref >60)
Glucose, Bld: 106 mg/dL — ABNORMAL HIGH (ref 70–99)
Potassium: 3.6 mmol/L (ref 3.5–5.1)
Sodium: 140 mmol/L (ref 135–145)
Total Bilirubin: 0.5 mg/dL (ref 0.3–1.2)
Total Protein: 7.7 g/dL (ref 6.5–8.1)

## 2022-06-05 LAB — TROPONIN I (HIGH SENSITIVITY)
Troponin I (High Sensitivity): 16 ng/L (ref <18)
Troponin I (High Sensitivity): 15 ng/L (ref <18)

## 2022-06-05 LAB — BRAIN NATRIURETIC PEPTIDE: B Natriuretic Peptide: 34.4 pg/mL (ref 0.0–100.0)

## 2022-06-05 MED ORDER — BACLOFEN 10 MG PO TABS
10.0000 mg | ORAL_TABLET | Freq: Three times a day (TID) | ORAL | Status: DC
  Administered 2022-06-05: 10 mg via ORAL
  Filled 2022-06-05: qty 1

## 2022-06-05 MED ORDER — OXYCODONE-ACETAMINOPHEN 5-325 MG PO TABS
1.0000 | ORAL_TABLET | Freq: Once | ORAL | Status: AC
  Administered 2022-06-05: 1 via ORAL
  Filled 2022-06-05: qty 1

## 2022-06-05 MED ORDER — ALBUTEROL SULFATE HFA 108 (90 BASE) MCG/ACT IN AERS: 1.0000 | INHALATION_SPRAY | Freq: Four times a day (QID) | RESPIRATORY_TRACT | 0 refills | Status: DC | PRN

## 2022-06-05 MED ORDER — IPRATROPIUM-ALBUTEROL 0.5-2.5 (3) MG/3ML IN SOLN
3.0000 mL | Freq: Once | RESPIRATORY_TRACT | Status: AC
  Administered 2022-06-05: 3 mL via RESPIRATORY_TRACT
  Filled 2022-06-05: qty 3

## 2022-06-05 MED ORDER — OXYCODONE-ACETAMINOPHEN 5-325 MG PO TABS: 1.0000 | ORAL_TABLET | Freq: Four times a day (QID) | ORAL | 0 refills | Status: DC | PRN
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
## 2022-06-05 NOTE — ED Triage Notes (Addendum)
Pt coming from home via EMS with c/o worsening SOB x2 days. Pt c/o chest tightness starting this morning. Pt also c/o neck, throat, and bilateral shoulder pain since spine surgery in January. Hx COPD.  1 duoneb 125mg  solumedrol

## 2022-06-05 NOTE — Discharge Instructions (Addendum)
Please take your medications as prescribed. Take tylenol/ibuprofen for pain. I recommend close follow-up with PCP for reevaluation.  Please do not hesitate to return to emergency department if worrisome signs symptoms we discussed become apparent.  

## 2022-06-08 ENCOUNTER — Ambulatory Visit (INDEPENDENT_AMBULATORY_CARE_PROVIDER_SITE_OTHER): Payer: 59 | Admitting: Orthopaedic Surgery

## 2022-06-08 ENCOUNTER — Encounter: Payer: Self-pay | Admitting: Orthopaedic Surgery

## 2022-06-08 ENCOUNTER — Other Ambulatory Visit: Payer: Self-pay

## 2022-06-08 DIAGNOSIS — M1711 Unilateral primary osteoarthritis, right knee: Secondary | ICD-10-CM | POA: Diagnosis not present

## 2022-06-08 DIAGNOSIS — G8929 Other chronic pain: Secondary | ICD-10-CM

## 2022-06-08 DIAGNOSIS — M25511 Pain in right shoulder: Secondary | ICD-10-CM | POA: Diagnosis not present
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
## 2022-06-08 IMAGING — DX DG CHEST 1V PORT
1 series · 1 of 1 positions shown · non-contrast
Comparison: Portable chest 11/21/2020, chest CT 11/15/2020.

CLINICAL DATA: Follow-up for pleural effusions and consolidation
changes.

EXAM:
PORTABLE CHEST 1 VIEW

[chest ap]
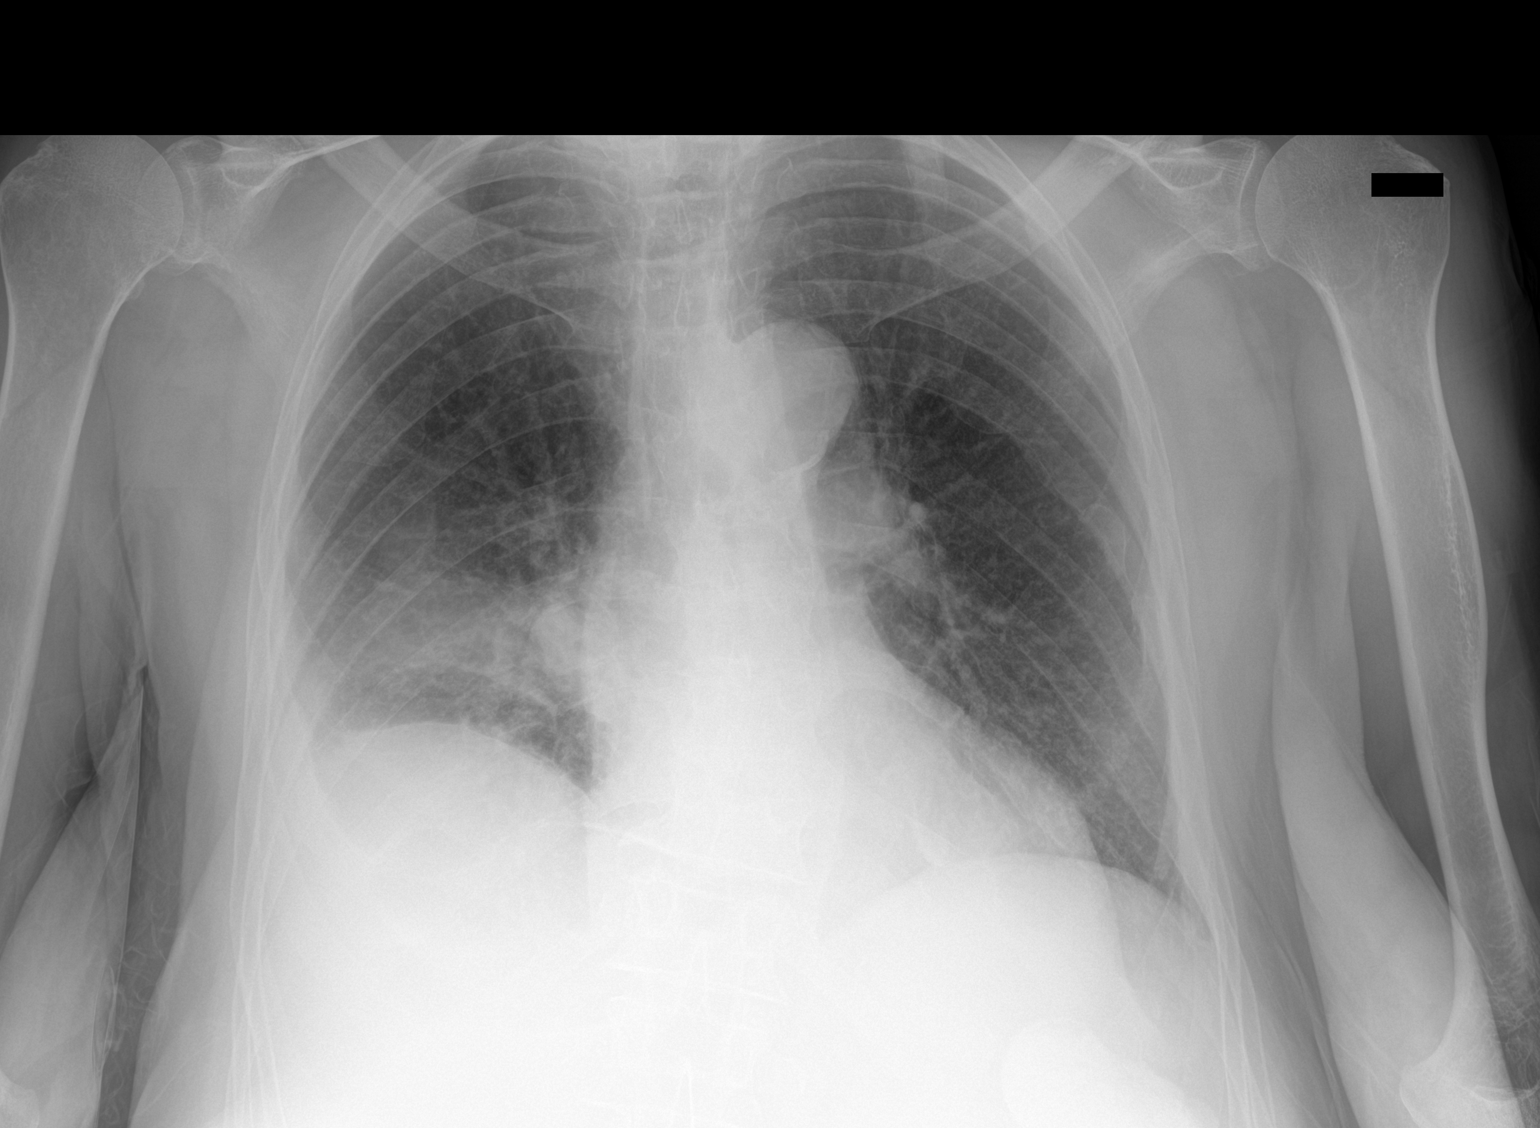

[1 of 1 positions shown; findings below may reference images not displayed]

FINDINGS: The cardiac size is normal. The aorta is tortuous with patchy
calcifications. Moderate right and small left pleural effusions are
unchanged as well as overlying opacities of the right greater than
left lower lung zones, again which could be pneumonia, atelectasis
or combination.

No new abnormality is seen. The overall aeration seems unchanged
aside from mild reduction in the right pleural fluid versus
redistribution.
IMPRESSION: There may be a mild interval reduction in right pleural fluid versus
redistribution. No change in the bilateral lung opacities is seen.

## 2022-06-08 MED ORDER — LIDOCAINE HCL 1 % IJ SOLN
3.0000 mL | INTRAMUSCULAR | Status: AC | PRN
Start: 2022-06-08 — End: 2022-06-08
  Administered 2022-06-08: 3 mL

## 2022-06-08 MED ORDER — METHYLPREDNISOLONE ACETATE 40 MG/ML IJ SUSP
40.0000 mg | INTRAMUSCULAR | Status: AC | PRN
Start: 2022-06-08 — End: 2022-06-08
  Administered 2022-06-08: 40 mg via INTRA_ARTICULAR

## 2022-06-08 NOTE — Progress Notes (Signed)
HPI: [name] comes in today for follow-up 1 month right shoulder pain.  And she is someone who had an injection in her right shoulder elsewhere and has very little information how long ago this was where it was performed.  MRI of her right shoulder did show significant subdeltoid and subacromial bursitis of the shoulder but no muscle atrophy noted rotator cuff tear.  Some mild AC joint arthritis was noted.  She comes in today stating her right shoulder still bothering her.  But her main complaint today is her right knee.  She wants it right knee injection.  Radiographs of the right knee dated 04/25/2022 are reviewed and show tricompartmental arthritis with near bone-on-bone lateral compartment moderate narrowing of the medial compartment patellofemoral moderate to moderately severe arthritic changes.  She reports she has had no injection in the right knee in some time greater than 6 months ago.  Review of systems: See HPI otherwise negative  Physical exam: General well-developed well-nourished female who uses a rollator to ambulate. Bilateral shoulders 5 5 strength with external and internal rotation against resistance.  Empty can test is negative on the left positive on the right.  Liftoff test is negative bilaterally.  Impingement testing is positive on the right negative on the left.  Bilateral knees good range of motion both knees.  The right knee with significant patellofemoral crepitus.  Tenderness along the lateral joint line.  No instability valgus varus stressing of either knee.  No abnormal warmth erythema or effusion of either knee.  Impression: Chronic right shoulder pain Right knee tricompartmental arthritis  Plan: In regards to his shoulder we will send her to formal physical therapy to work on range of motion and strengthening include modalities and a home exercise program for her right shoulder.  Offered her right knee cortisone injection today she was agreeable and tolerated the  injection well.  Discussed quad strengthening exercises with her.  She will follow-up with [place] in 6 weeks see how she is doing overall.  Questions were encouraged and answered.      Procedure Note  Patient: [name]             Date of Birth: 1956/01/06           MRN: [id number]             Visit Date: 06/08/2022  Procedures: Visit Diagnoses:  1. Chronic right shoulder pain     Large Joint Inj: R knee on 06/08/2022 9:40 AM Indications: pain Details: 22 G 1.5 in needle, anterolateral approach  Arthrogram: No  Medications: 3 mL lidocaine 1 %; 40 mg methylPREDNISolone acetate 40 MG/ML Outcome: tolerated well, no immediate complications Procedure, treatment alternatives, risks and benefits explained, specific risks discussed. Consent was given by the patient. Immediately prior to procedure a time out was called to verify the correct patient, procedure, equipment, support staff and site/side marked as required. Patient was prepped and draped in the usual sterile fashion.

## 2022-06-09 ENCOUNTER — Other Ambulatory Visit: Payer: Self-pay | Admitting: Student

## 2022-06-09 ENCOUNTER — Telehealth: Payer: Self-pay

## 2022-06-09 ENCOUNTER — Other Ambulatory Visit: Payer: Self-pay | Admitting: Nurse Practitioner

## 2022-06-09 DIAGNOSIS — R102 Pelvic and perineal pain: Secondary | ICD-10-CM

## 2022-06-09 DIAGNOSIS — M8580 Other specified disorders of bone density and structure, unspecified site: Secondary | ICD-10-CM

## 2022-06-09 DIAGNOSIS — N390 Urinary tract infection, site not specified: Secondary | ICD-10-CM

## 2022-06-09 DIAGNOSIS — Z0000 Encounter for general adult medical examination without abnormal findings: Secondary | ICD-10-CM

## 2022-06-09 NOTE — Telephone Encounter (Signed)
  Message Received: [name], Chestine Spore, RN Would you like me to make a telephone encounter regarding this call?  Media Information        Document Information  AMB Correspondence  TELEPHONE ADVICE RECORD  06/05/2022 07:22  Attached To:  [name]  Source Information  Default, Provider, MD

## 2022-06-09 NOTE — Telephone Encounter (Signed)
She presented to the ED on 06/05/22 and saw orthopedics for some of these symptoms on 06/08/22. I contacted [name] to see if she needed anything from [place]. She inquired when her next appointment is, which I informed her of. She did not need anything from our department at this time. I encouraged her to contact us if we can be of assistance in between appointments.

## 2022-06-22 IMAGING — CT CT ABD-PELV W/O CM
2 of 4 series · 15 of 46 positions shown, 17 images · non-contrast
Comparison: CT abdomen and pelvis dated November 26, 2020

CLINICAL DATA: Concern for infection

EXAM:
CT ABDOMEN AND PELVIS WITHOUT CONTRAST
TECHNIQUE: Multidetector CT imaging of the abdomen and pelvis was performed
following the standard protocol without IV contrast.

[Series 2: axial st · axial · 0.68mm/px · z∈[+969,+1389]mm · 12 of 96 slices shown, 14 images]
[im 6/96  soft-tissue]
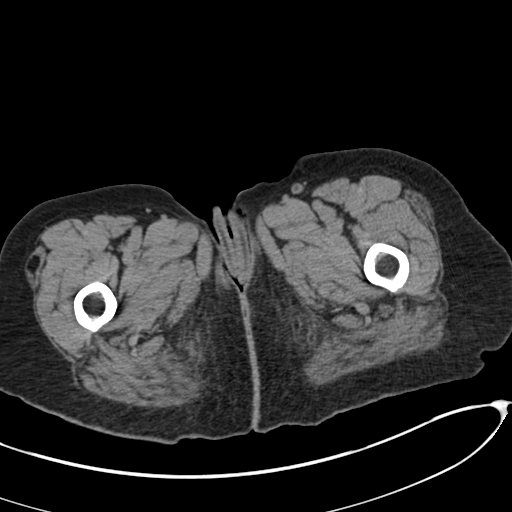
[im 6/96  bone]
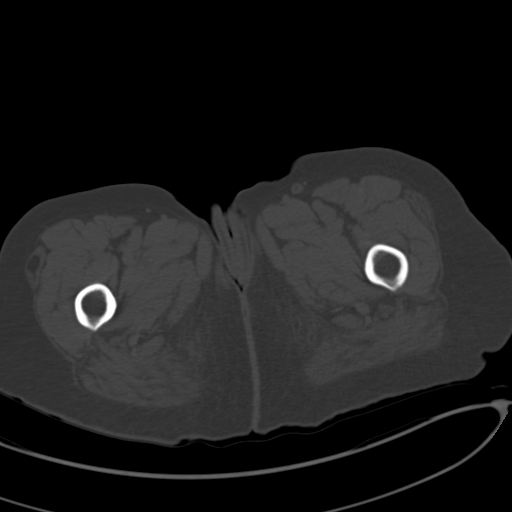
[im 16/96  soft-tissue]
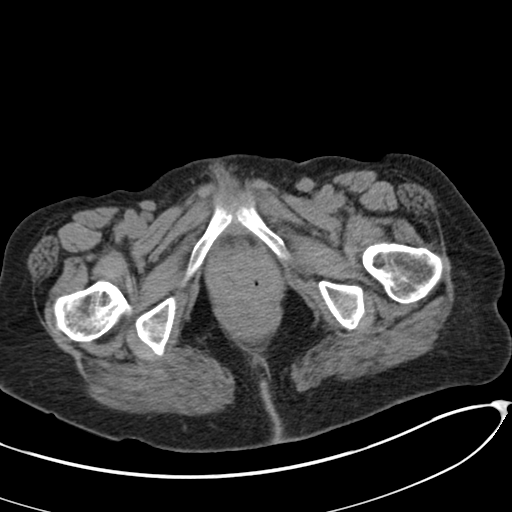
[im 22/96  soft-tissue]
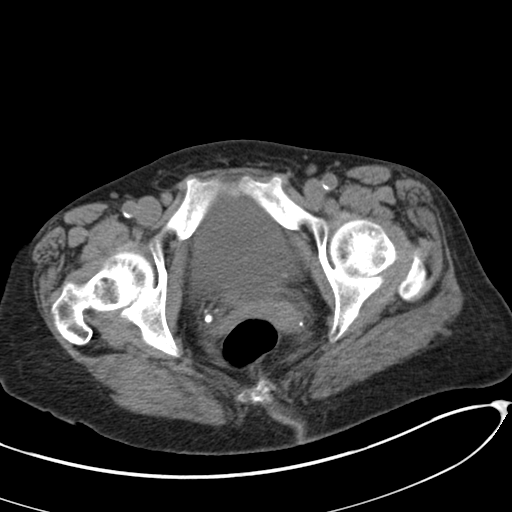
[im 27/96  soft-tissue]
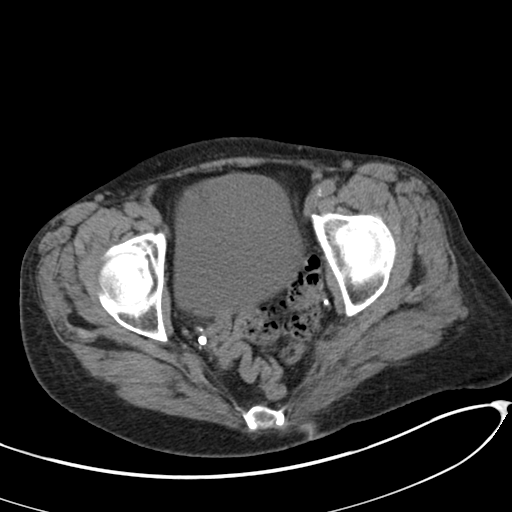
[im 37/96  soft-tissue]
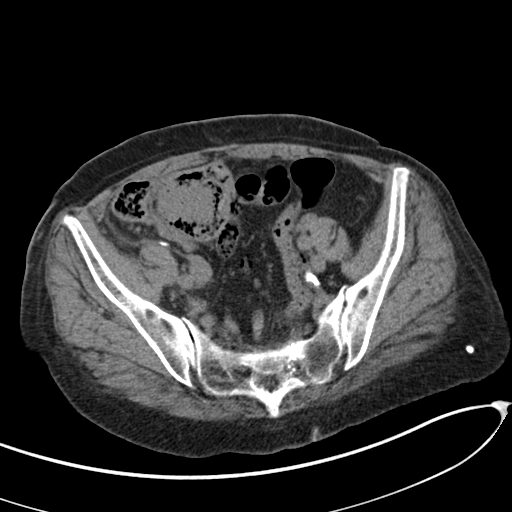
[im 43/96  soft-tissue]
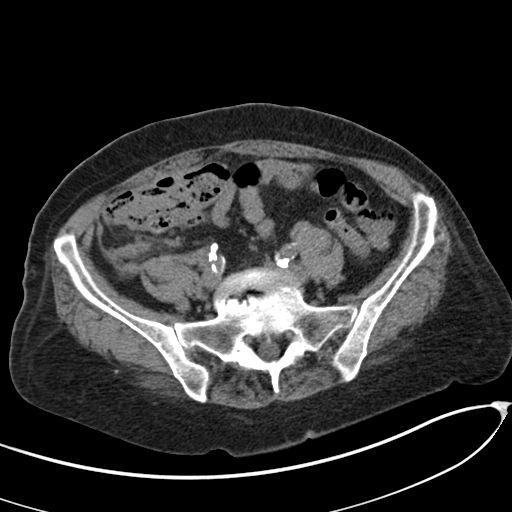
[im 53/96  soft-tissue]
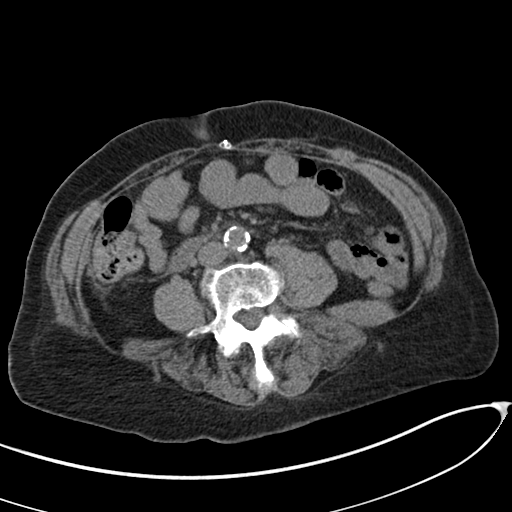
[im 59/96  soft-tissue]
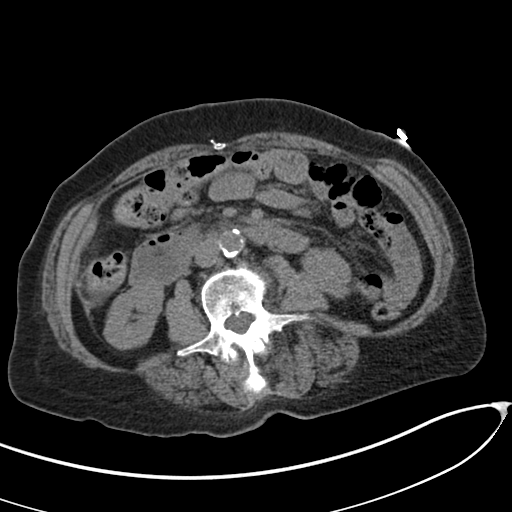
[im 69/96  soft-tissue]
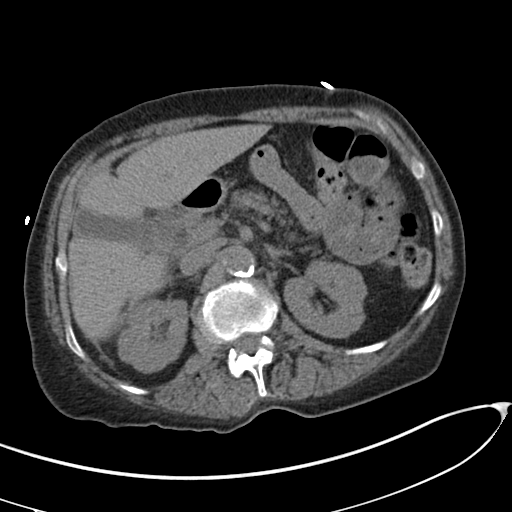
[im 69/96  bone]
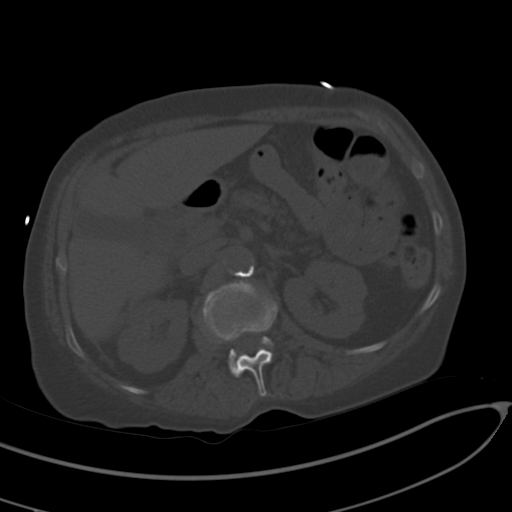
[im 74/96  soft-tissue]
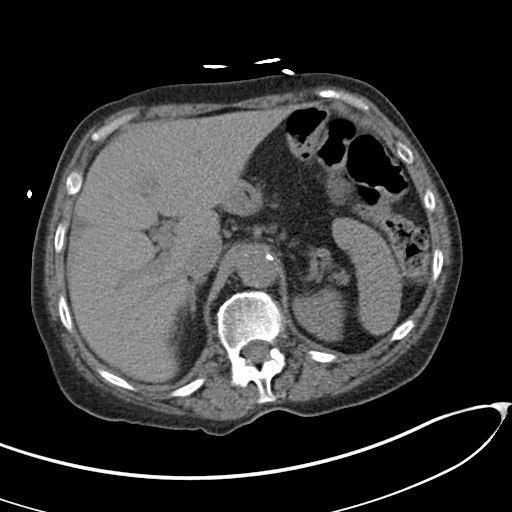
[im 80/96  soft-tissue]
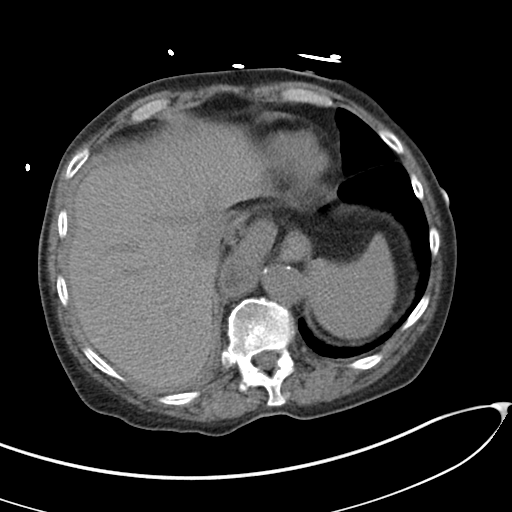
[im 90/96  soft-tissue]
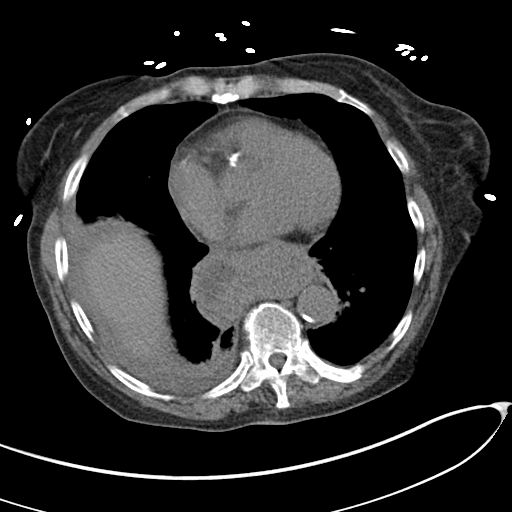

[Series 5: coronal st · coronal · 0.62mm/px · 3 of 132 slices shown]
[im 44/132  soft-tissue]
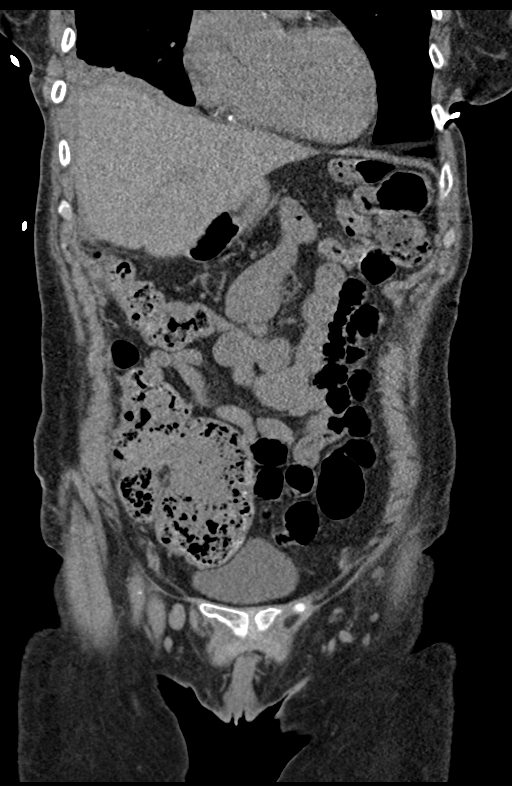
[im 59/132  soft-tissue]
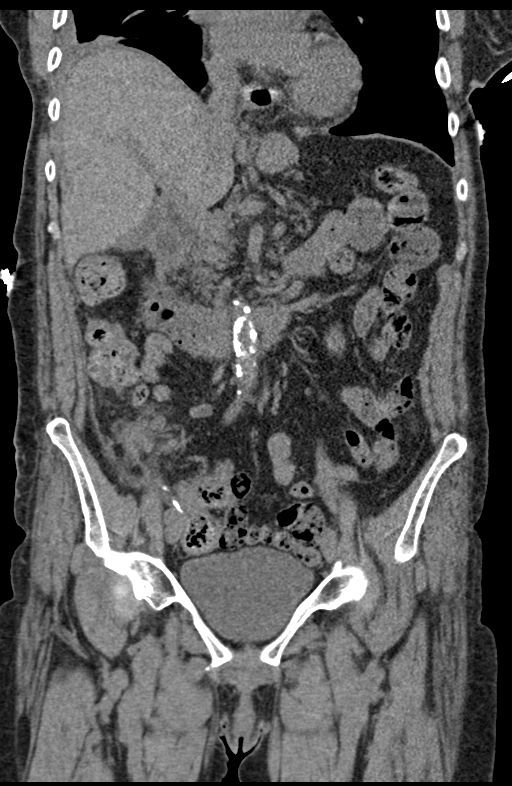
[im 73/132  soft-tissue]
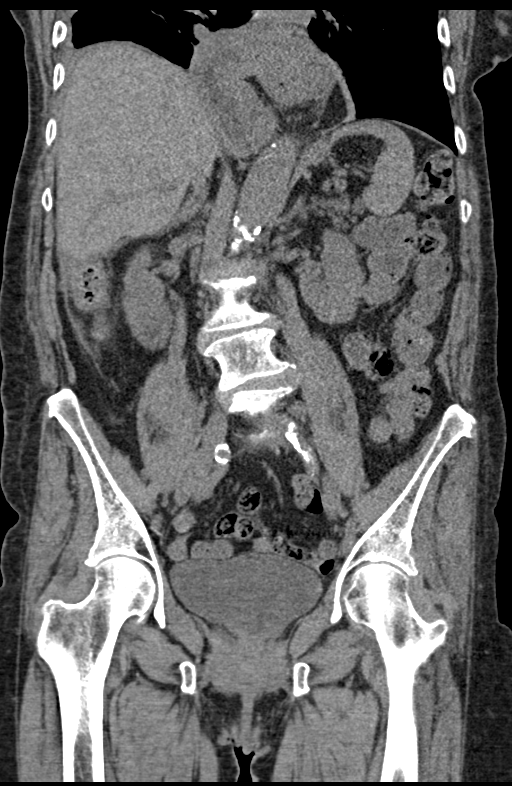

[15 of 46 positions shown; findings below may reference images not displayed]

FINDINGS: Lower chest: Coronary artery calcifications. Small right pleural
effusion with associated atelectasis.

Hepatobiliary: No focal liver abnormality is seen. No gallstones,
gallbladder wall thickening, or biliary dilatation.

Pancreas: Unremarkable. No pancreatic ductal dilatation or
surrounding inflammatory changes.

Spleen: Normal in size without focal abnormality.

Adrenals/Urinary Tract: Bilateral adrenal glands are unremarkable.
No hydronephrosis or nephrolithiasis. Bladder is unremarkable.

Stomach/Bowel: Large hiatal hernia. Appendix surgically absent. No
focal bowel wall thickening or evidence of obstruction.

Vascular/Lymphatic: Aortic atherosclerosis. No enlarged abdominal or
pelvic lymph nodes.

Reproductive: Status post hysterectomy. No adnexal masses.

Other: No abdominopelvic ascites. Inflammatory change of the right
lower quadrant which tracks superiorly along the right paracolic
gutter, similar to slightly decreased when compared to the prior
exam.

Musculoskeletal: Metallic density of the left sacrum, unchanged
compared to prior exam. No aggressive appearing osseous lesions
IMPRESSION: 1. Right lower quadrant inflammatory change is similar to slightly
decreased when compared with prior exam. No organized fluid
collection.
2. Small right pleural effusion and atelectasis.
3. Large hiatal hernia.
4.  Aortic Atherosclerosis ([id number]-X5M.M).

## 2022-06-25 ENCOUNTER — Emergency Department (HOSPITAL_COMMUNITY): Payer: 59

## 2022-06-25 ENCOUNTER — Other Ambulatory Visit: Payer: Self-pay

## 2022-06-25 ENCOUNTER — Emergency Department (HOSPITAL_COMMUNITY)
Admission: EM | Admit: 2022-06-25 | Discharge: 2022-06-25 | Disposition: A | Discharge status: Home / Self Care | Payer: 59 | Attending: Emergency Medicine | Admitting: Emergency Medicine

## 2022-06-25 DIAGNOSIS — W06XXXA Fall from bed, initial encounter: Secondary | ICD-10-CM | POA: Insufficient documentation

## 2022-06-25 DIAGNOSIS — M79604 Pain in right leg: Secondary | ICD-10-CM | POA: Insufficient documentation

## 2022-06-25 DIAGNOSIS — Z7951 Long term (current) use of inhaled steroids: Secondary | ICD-10-CM | POA: Diagnosis not present

## 2022-06-25 DIAGNOSIS — J449 Chronic obstructive pulmonary disease, unspecified: Secondary | ICD-10-CM | POA: Insufficient documentation

## 2022-06-25 DIAGNOSIS — M109 Gout, unspecified: Secondary | ICD-10-CM

## 2022-06-25 DIAGNOSIS — N183 Chronic kidney disease, stage 3 unspecified: Secondary | ICD-10-CM | POA: Diagnosis not present

## 2022-06-25 DIAGNOSIS — Z79899 Other long term (current) drug therapy: Secondary | ICD-10-CM | POA: Diagnosis not present

## 2022-06-25 DIAGNOSIS — I2510 Atherosclerotic heart disease of native coronary artery without angina pectoris: Secondary | ICD-10-CM | POA: Diagnosis not present

## 2022-06-25 DIAGNOSIS — I129 Hypertensive chronic kidney disease with stage 1 through stage 4 chronic kidney disease, or unspecified chronic kidney disease: Secondary | ICD-10-CM | POA: Diagnosis not present

## 2022-06-25 DIAGNOSIS — Y9384 Activity, sleeping: Secondary | ICD-10-CM | POA: Diagnosis not present

## 2022-06-25 DIAGNOSIS — R079 Chest pain, unspecified: Secondary | ICD-10-CM | POA: Diagnosis present

## 2022-06-25 DIAGNOSIS — R0789 Other chest pain: Secondary | ICD-10-CM

## 2022-06-25 LAB — BASIC METABOLIC PANEL
Anion gap: 8 (ref 5–15)
BUN: 15 mg/dL (ref 8–23)
CO2: 20 mmol/L — ABNORMAL LOW (ref 22–32)
Calcium: 8.3 mg/dL — ABNORMAL LOW (ref 8.9–10.3)
Chloride: 110 mmol/L (ref 98–111)
Creatinine, Ser: 1 mg/dL (ref 0.44–1.00)
GFR, Estimated: >60 mL/min (ref >60)
Glucose, Bld: 116 mg/dL — ABNORMAL HIGH (ref 70–99)
Potassium: 3.8 mmol/L (ref 3.5–5.1)
Sodium: 138 mmol/L (ref 135–145)

## 2022-06-25 LAB — CBC
HCT: 34.6 % — ABNORMAL LOW (ref 36.0–46.0)
Hemoglobin: 11.2 g/dL — ABNORMAL LOW (ref 12.0–15.0)
MCH: 28.7 pg (ref 26.0–34.0)
MCHC: 32.4 g/dL (ref 30.0–36.0)
MCV: 88.7 fL (ref 80.0–100.0)
Platelets: 296 10*3/uL (ref 150–400)
RBC: 3.9 MIL/uL (ref 3.87–5.11)
RDW: 16 % — ABNORMAL HIGH (ref 11.5–15.5)
WBC: 6.1 10*3/uL (ref 4.0–10.5)
nRBC: 0 % (ref 0.0–0.2)

## 2022-06-25 LAB — TROPONIN I (HIGH SENSITIVITY)
Troponin I (High Sensitivity): 13 ng/L (ref <18)
Troponin I (High Sensitivity): 12 ng/L (ref <18)

## 2022-06-25 MED ORDER — ACETAMINOPHEN 500 MG PO TABS
1000.0000 mg | ORAL_TABLET | Freq: Once | ORAL | Status: AC
  Administered 2022-06-25: 1000 mg via ORAL
  Filled 2022-06-25: qty 2

## 2022-06-25 MED ORDER — NAPROXEN 500 MG PO TABS: 500.0000 mg | ORAL_TABLET | Freq: Two times a day (BID) | ORAL | 0 refills | Status: DC

## 2022-06-25 MED ORDER — NAPROXEN 250 MG PO TABS
500.0000 mg | ORAL_TABLET | Freq: Once | ORAL | Status: AC
  Administered 2022-06-25: 500 mg via ORAL
  Filled 2022-06-25: qty 2
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
## 2022-06-25 NOTE — ED Notes (Signed)
Pt dc/d home w ambu. Pt a&ox4,vss,nad at time of discharge pt verbalized understanding of f/u care. IV removed, no complaints at time of dc.

## 2022-06-25 NOTE — Discharge Instructions (Addendum)
Your labs and imaging didn't show any emergent findings today. It did show concerns for old rib fractures, however, there were no new fractures (break in bone) seen today. At home you may take over the counter 500 mg tylenol every 6 hours as needed for your symptoms. You have been sent a prescription for naprosyn to aid with your symptoms and likely gout flare. You may place ice/heat to the affected area up to 15 minutes at a time, ensure to place a barrier between your skin and the ice/heat. Call your primary care provider regarding todays ED visit to set up a follow up appointment. Return to the ED if you are experiencing increasing/worsening symptoms.

## 2022-06-25 NOTE — ED Triage Notes (Signed)
Pt bib [facility] from home. Pt c/o chest pain and right lower leg pain from falling out of wheelchair before ems arrival. Ems administered 324 asa and 2 doses of SL nitro . Chest pain has improved. Pt is not on blood thinners, pt stated she did not hit her head.  Bp- 124/70 Hr- 68 98% ra Rr- 20
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
## 2022-06-25 NOTE — ED Notes (Signed)
PTAR  ETA 30 minute

## 2022-06-29 ENCOUNTER — Ambulatory Visit: Payer: 59 | Attending: Orthopaedic Surgery
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
## 2022-07-08 ENCOUNTER — Ambulatory Visit
Admission: RE | Admit: 2022-07-08 | Discharge: 2022-07-08 | Disposition: A | Discharge status: Home / Self Care | Payer: 59 | Source: Ambulatory Visit | Attending: Nurse Practitioner | Admitting: Nurse Practitioner

## 2022-07-08 DIAGNOSIS — R102 Pelvic and perineal pain: Secondary | ICD-10-CM

## 2022-07-08 DIAGNOSIS — N390 Urinary tract infection, site not specified: Secondary | ICD-10-CM

## 2022-07-09 ENCOUNTER — Ambulatory Visit: Payer: 59

## 2022-07-21 ENCOUNTER — Ambulatory Visit: Payer: 59 | Admitting: Orthopaedic Surgery
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
## 2022-07-22 IMAGING — CR DG CHEST 2V
2 series · 2 of 2 positions shown · non-contrast
Comparison: November 26, 2020

CLINICAL DATA: Fever

EXAM:
CHEST - 2 VIEW

[w chest pa]
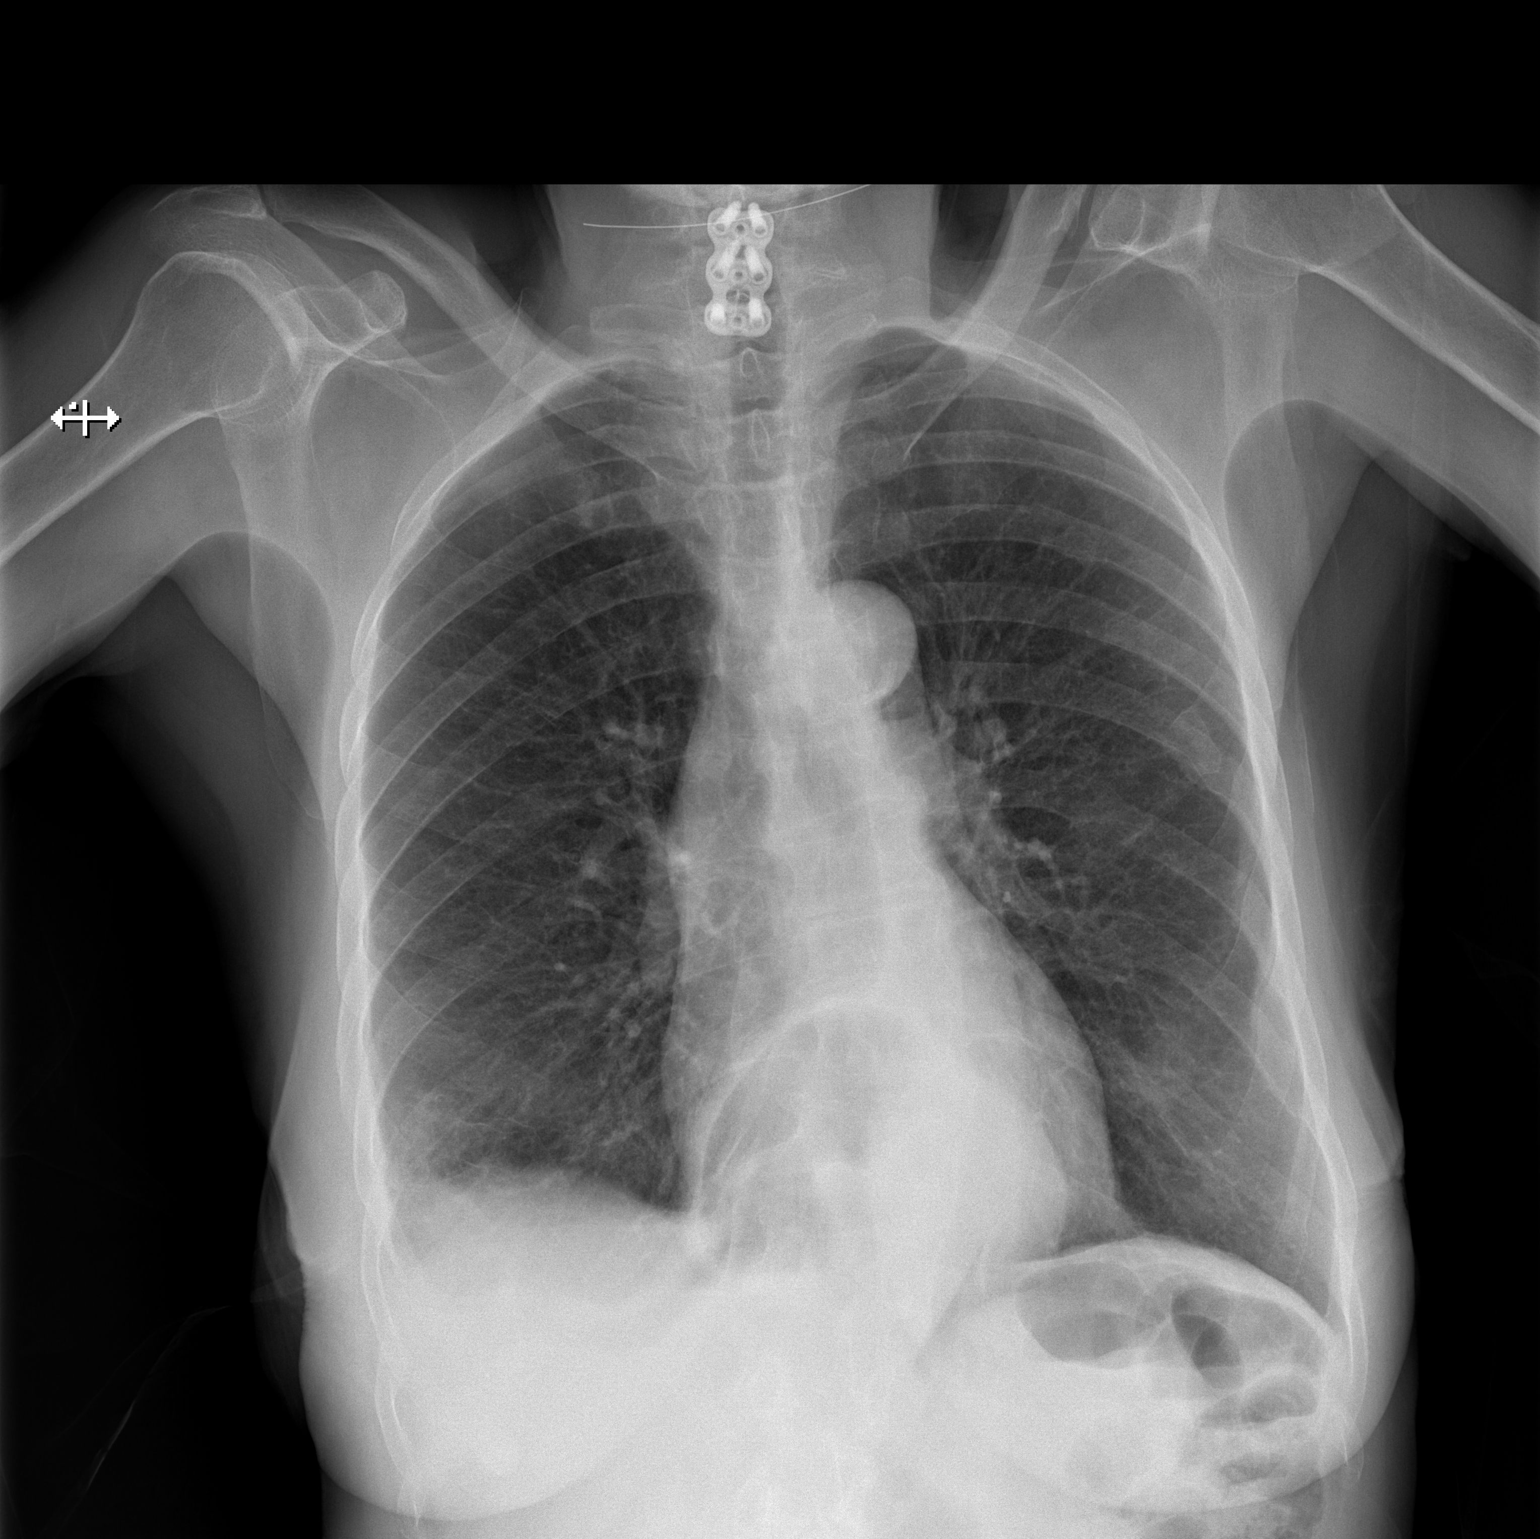

[w chest lat]
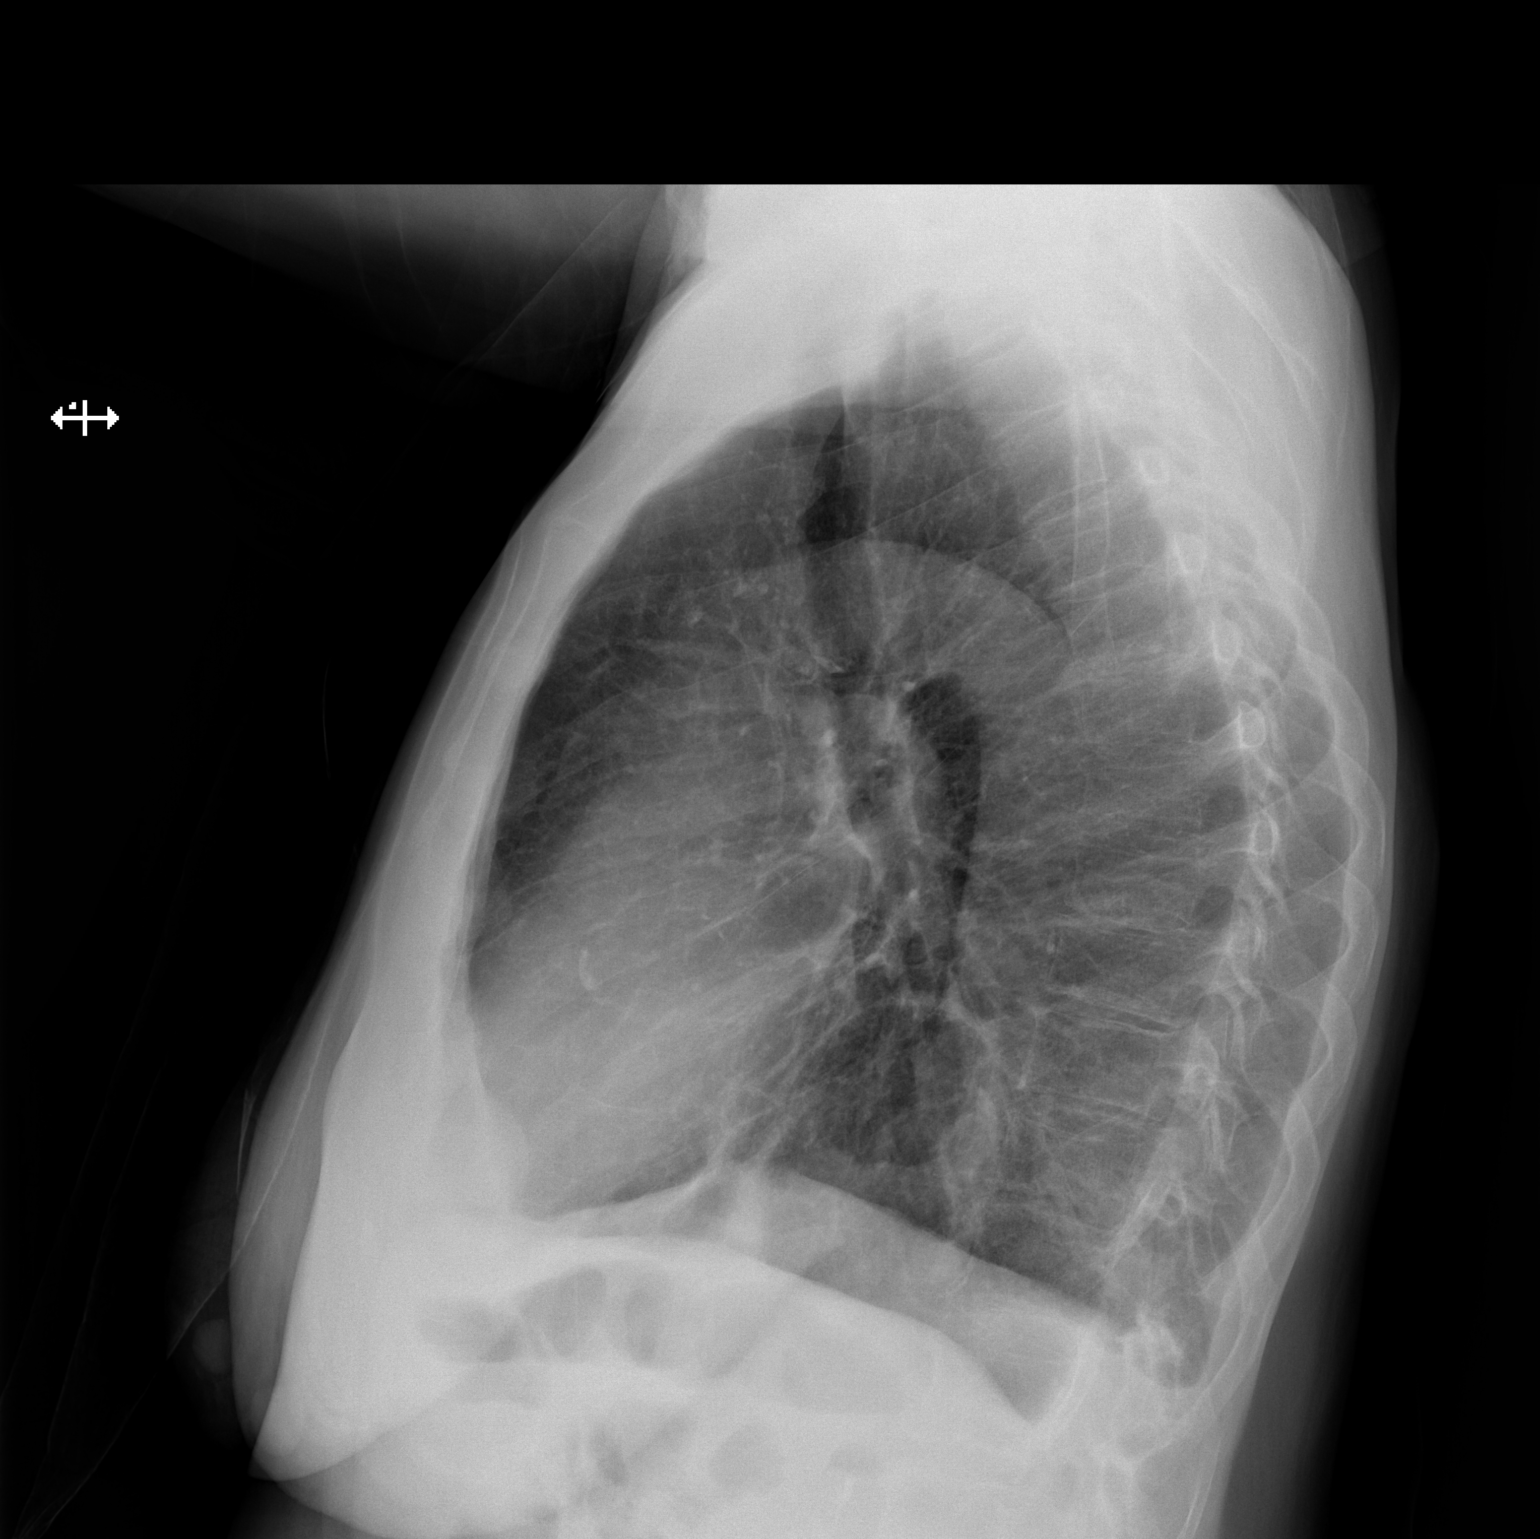

[2 of 2 positions shown; findings below may reference images not displayed]

FINDINGS: The mediastinal contour and cardiac silhouette are normal. Hiatal
hernia is identified. Probable minimal right pleural effusion is
noted. Mild focal scar is identified in the lateral right lung base.
The left lung clear.
IMPRESSION: Probable minimal right pleural effusion.

## 2022-07-26 ENCOUNTER — Other Ambulatory Visit: Payer: Self-pay | Admitting: Student

## 2022-07-26 ENCOUNTER — Ambulatory Visit: Admission: RE | Admit: 2022-07-26 | Payer: 59 | Source: Ambulatory Visit

## 2022-07-26 DIAGNOSIS — J209 Acute bronchitis, unspecified: Secondary | ICD-10-CM

## 2022-07-29 ENCOUNTER — Other Ambulatory Visit: Payer: Self-pay | Admitting: Nurse Practitioner

## 2022-07-29 ENCOUNTER — Ambulatory Visit
Admission: RE | Admit: 2022-07-29 | Discharge: 2022-07-29 | Disposition: A | Discharge status: Home / Self Care | Payer: 59 | Source: Ambulatory Visit | Attending: Nurse Practitioner | Admitting: Nurse Practitioner

## 2022-07-29 DIAGNOSIS — Z0000 Encounter for general adult medical examination without abnormal findings: Secondary | ICD-10-CM

## 2022-08-06 ENCOUNTER — Other Ambulatory Visit: Payer: Self-pay | Admitting: Orthopedic Surgery

## 2022-08-06 DIAGNOSIS — Z981 Arthrodesis status: Secondary | ICD-10-CM

## 2022-08-06 DIAGNOSIS — G959 Disease of spinal cord, unspecified: Secondary | ICD-10-CM
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
## 2022-08-09 ENCOUNTER — Encounter: Payer: Self-pay | Admitting: Orthopedic Surgery

## 2022-08-09 ENCOUNTER — Ambulatory Visit (INDEPENDENT_AMBULATORY_CARE_PROVIDER_SITE_OTHER): Payer: 59 | Admitting: Orthopedic Surgery

## 2022-08-09 VITALS — BP 140/84 | Ht 66.0 in | Wt 164.0 lb

## 2022-08-09 DIAGNOSIS — Z09 Encounter for follow-up examination after completed treatment for conditions other than malignant neoplasm: Secondary | ICD-10-CM | POA: Diagnosis not present

## 2022-08-09 DIAGNOSIS — M542 Cervicalgia: Secondary | ICD-10-CM | POA: Diagnosis not present

## 2022-08-09 DIAGNOSIS — M5412 Radiculopathy, cervical region: Secondary | ICD-10-CM | POA: Diagnosis not present

## 2022-08-09 DIAGNOSIS — Z981 Arthrodesis status: Secondary | ICD-10-CM | POA: Diagnosis not present

## 2022-08-09 NOTE — Patient Instructions (Signed)
It was so nice to see you today. Thank you so much for coming in.    I am sorry that you are hurting so much.   I want to get updated xrays of your neck. These are ordered. You can go to [facility] to get these done. You do not need an appointment. Just tell them you need xrays of your neck done. The order is in the system.   Once I get your xrays back, I will review with Dr. Myer Haff and call you with further plan.   Follow up with your pain doctor as scheduled.   Please do not hesitate to call if you have any questions or concerns. You can also message me in MyChart.   Drake Leach PA-C [phone]

## 2022-08-10 ENCOUNTER — Ambulatory Visit: Payer: 59 | Admitting: Orthopedic Surgery

## 2022-08-12 ENCOUNTER — Emergency Department (HOSPITAL_COMMUNITY)
Admission: EM | Admit: 2022-08-12 | Discharge: 2022-08-13 | Disposition: A | Discharge status: Home / Self Care | Payer: 59 | Attending: Emergency Medicine | Admitting: Emergency Medicine

## 2022-08-12 ENCOUNTER — Encounter (HOSPITAL_COMMUNITY): Payer: Self-pay | Admitting: *Deleted

## 2022-08-12 ENCOUNTER — Emergency Department (HOSPITAL_COMMUNITY): Payer: 59

## 2022-08-12 ENCOUNTER — Other Ambulatory Visit: Payer: Self-pay

## 2022-08-12 DIAGNOSIS — R400 Somnolence: Secondary | ICD-10-CM | POA: Insufficient documentation

## 2022-08-12 DIAGNOSIS — N390 Urinary tract infection, site not specified: Secondary | ICD-10-CM | POA: Diagnosis not present

## 2022-08-12 DIAGNOSIS — N189 Chronic kidney disease, unspecified: Secondary | ICD-10-CM | POA: Insufficient documentation

## 2022-08-12 DIAGNOSIS — J449 Chronic obstructive pulmonary disease, unspecified: Secondary | ICD-10-CM | POA: Diagnosis not present

## 2022-08-12 DIAGNOSIS — Z79899 Other long term (current) drug therapy: Secondary | ICD-10-CM | POA: Diagnosis not present

## 2022-08-12 DIAGNOSIS — R4182 Altered mental status, unspecified: Secondary | ICD-10-CM | POA: Diagnosis present

## 2022-08-12 LAB — COMPREHENSIVE METABOLIC PANEL
ALT: 10 U/L (ref 0–44)
AST: 12 U/L — ABNORMAL LOW (ref 15–41)
Albumin: 3.1 g/dL — ABNORMAL LOW (ref 3.5–5.0)
Alkaline Phosphatase: 73 U/L (ref 38–126)
Anion gap: 9 (ref 5–15)
BUN: 16 mg/dL (ref 8–23)
CO2: 20 mmol/L — ABNORMAL LOW (ref 22–32)
Calcium: 8.3 mg/dL — ABNORMAL LOW (ref 8.9–10.3)
Chloride: 108 mmol/L (ref 98–111)
Creatinine, Ser: 1.2 mg/dL — ABNORMAL HIGH (ref 0.44–1.00)
GFR, Estimated: 49 mL/min — ABNORMAL LOW (ref >60)
Glucose, Bld: 115 mg/dL — ABNORMAL HIGH (ref 70–99)
Potassium: 4.2 mmol/L (ref 3.5–5.1)
Sodium: 137 mmol/L (ref 135–145)
Total Bilirubin: 0.4 mg/dL (ref 0.3–1.2)
Total Protein: 6.7 g/dL (ref 6.5–8.1)

## 2022-08-12 LAB — CBC
HCT: 35.5 % — ABNORMAL LOW (ref 36.0–46.0)
Hemoglobin: 10.8 g/dL — ABNORMAL LOW (ref 12.0–15.0)
MCH: 27.1 pg (ref 26.0–34.0)
MCHC: 30.4 g/dL (ref 30.0–36.0)
MCV: 89 fL (ref 80.0–100.0)
Platelets: 292 10*3/uL (ref 150–400)
RBC: 3.99 MIL/uL (ref 3.87–5.11)
RDW: 15.7 % — ABNORMAL HIGH (ref 11.5–15.5)
WBC: 6.8 10*3/uL (ref 4.0–10.5)
nRBC: 0 % (ref 0.0–0.2)

## 2022-08-12 LAB — URINALYSIS, ROUTINE W REFLEX MICROSCOPIC
Bilirubin Urine: NEGATIVE
Glucose, UA: NEGATIVE mg/dL
Hgb urine dipstick: NEGATIVE
Ketones, ur: NEGATIVE mg/dL
Nitrite: NEGATIVE
Protein, ur: NEGATIVE mg/dL
Specific Gravity, Urine: 1.008 (ref 1.005–1.030)
pH: 6 (ref 5.0–8.0)

## 2022-08-12 LAB — ETHANOL: Alcohol, Ethyl (B): <10 mg/dL (ref <10)

## 2022-08-12 LAB — RAPID URINE DRUG SCREEN, HOSP PERFORMED
Amphetamines: NOT DETECTED
Barbiturates: NOT DETECTED
Benzodiazepines: NOT DETECTED
Cocaine: NOT DETECTED
Opiates: NOT DETECTED
Tetrahydrocannabinol: NOT DETECTED

## 2022-08-12 LAB — CBG MONITORING, ED
Glucose-Capillary: 111 mg/dL — ABNORMAL HIGH (ref 70–99)
Glucose-Capillary: 114 mg/dL — ABNORMAL HIGH (ref 70–99)

## 2022-08-12 MED ORDER — SODIUM CHLORIDE 0.9 % IV SOLN
1.0000 g | Freq: Once | INTRAVENOUS | Status: AC
  Administered 2022-08-12: 1 g via INTRAVENOUS
  Filled 2022-08-12: qty 10

## 2022-08-12 MED ORDER — NALOXONE HCL 0.4 MG/ML IJ SOLN
0.4000 mg | Freq: Once | INTRAMUSCULAR | Status: AC
  Administered 2022-08-12: 0.4 mg via INTRAVENOUS
  Filled 2022-08-12: qty 1

## 2022-08-12 MED ORDER — CEPHALEXIN 500 MG PO CAPS: 500.0000 mg | ORAL_CAPSULE | Freq: Four times a day (QID) | ORAL | 0 refills | Status: DC

## 2022-08-12 NOTE — ED Notes (Signed)
Patterson Hammersmith (sister) is who should be contacted regarding this patient and her discharge.  [phone].

## 2022-08-12 NOTE — Discharge Instructions (Addendum)
You have been evaluated for your symptoms.  You have been diagnosed with a urinary tract infection.  Please take antibiotic as prescribed.  Please remember to take your medication as prescribed and do not take more medication than necessary as it may cause drowsiness.  Return if you have any concern.
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
## 2022-08-12 NOTE — ED Notes (Signed)
No change in patient mentation after narcan.
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
## 2022-08-13 ENCOUNTER — Telehealth: Payer: Self-pay | Admitting: Orthopedic Surgery

## 2022-08-13 MED ORDER — ACETAMINOPHEN 325 MG PO TABS
650.0000 mg | ORAL_TABLET | Freq: Once | ORAL | Status: AC
  Administered 2022-08-13: 650 mg via ORAL
  Filled 2022-08-13: qty 2

## 2022-08-13 NOTE — Telephone Encounter (Signed)
If she's having worsening shoulder pain, then she should follow up with ortho Myer Haff sent her there after her last visit with him).

## 2022-08-13 NOTE — Telephone Encounter (Signed)
Left message to return call 

## 2022-08-13 NOTE — ED Provider Notes (Signed)
  Physical Exam  BP (!) 145/81   Pulse (!) 58   Temp (!) 97.5 F (36.4 C) (Axillary)   Resp 18   Ht 5\' 6"  (1.676 m)   Wt 74 kg   SpO2 97%   BMI 26.33 kg/m   Physical Exam  Procedures  Procedures  ED Course / MDM    Medical Decision Making Amount and/or Complexity of Data Reviewed Labs: ordered. Radiology: ordered. ECG/medicine tests: ordered.  Risk Prescription drug management.   Patient care assumed from previous provider.  Please see their note for full detail.  In short, 67 year old female patient with history significant for polysubstance abuse, COPD, CKD, recurrent UTI brought for evaluation of altered mental status.  Patient was sleeping but arousable and in no acute discomfort.  Lab work significant for UTI, patient administered Rocephin.  Head and cervical spine CTs were reassuring with no acute findings.  Patient's sister believes patient may have taken extra gabapentin by accident.  Plan to watch patient and reassess hours later for improvement in mentation.  Patient awake at this time requesting to go home.  Patient is alert.  I see no indication for further emergent workup at this time.  Patient discharged home.       Pamala Duffel 08/13/22 0242    Zadie Rhine, MD 08/13/22 [phone]

## 2022-08-13 NOTE — Telephone Encounter (Signed)
She has pain in bilateral shoulders and down her arms. If the CT did not show anything can you order xrays of her shoulders and arms? The prednisone has helped with that pain.
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
## 2022-08-13 NOTE — Telephone Encounter (Signed)
She does not need to get the cervical xrays I ordered. They did a CT of her neck in the ED.   CT shows that hardware is intact with no fractures.   I will review CT with Dr. Myer Haff when he gets back (8/12) and we will call her with further plan.

## 2022-08-15 ENCOUNTER — Telehealth (HOSPITAL_BASED_OUTPATIENT_CLINIC_OR_DEPARTMENT_OTHER): Payer: Self-pay | Admitting: *Deleted

## 2022-08-15 NOTE — Telephone Encounter (Signed)
Post ED Visit - Positive Culture Follow-up  Culture report reviewed by antimicrobial stewardship pharmacist: [facility] []  Enzo Bi, Pharm.D. []  Celedonio Miyamoto, Pharm.D., BCPS AQ-ID []  Garvin Fila, Pharm.D., BCPS []  Georgina Pillion, Pharm.D., BCPS []  [place], [address].D., BCPS, AAHIVP []  Estella Husk, Pharm.D., BCPS, AAHIVP []  Lysle Pearl, PharmD, BCPS []  Phillips Climes, PharmD, BCPS []  Agapito Games, PharmD, BCPS []  Verlan Friends, PharmD []  Mervyn Gay, PharmD, BCPS []  Vinnie Level, PharmD  [facility] []  Len Childs, PharmD []  Greer Pickerel, PharmD []  Adalberto Cole, PharmD []  Perlie Gold, Rph []  Lonell Face) Jean Rosenthal, PharmD []  Earl Many, PharmD []  Junita Push, PharmD []  Dorna Leitz, PharmD []  Terrilee Files, PharmD []  Lynann Beaver, PharmD []  Keturah Barre, PharmD []  Loralee Pacas, PharmD [x]  Bernadene Person, PharmD   Positive urine culture Treated with Cephalexin, organism sensitive to the same and no further patient follow-up is required at this time.  Patsey Berthold 08/15/2022, 12:03 PM

## 2022-08-17 NOTE — Telephone Encounter (Signed)
Left another message to return call

## 2022-08-24 ENCOUNTER — Encounter: Payer: Self-pay | Admitting: Orthopedic Surgery

## 2022-08-24 NOTE — Progress Notes (Signed)
CT of cervical spine dated 08/12/22:  FINDINGS: Alignment: Normal.   Skull base and vertebrae: No acute fracture. No primary bone lesion or focal pathologic process. Anterior fusion plates are seen at C3-C4 and C5/C6/C7. There is no evidence for hardware loosening.   Soft tissues and spinal canal: No prevertebral fluid or swelling. No visible canal hematoma.   Disc levels: There is moderate disc space narrowing at C2-C3 and C4-C5 compatible with degenerative change. There is mild multilevel neural foraminal stenosis throughout the mid and upper cervical spine. There is no significant central canal stenosis at any level.   Upper chest: Mild emphysema.   Other: None.   IMPRESSION: 1. No acute fracture or traumatic subluxation of the cervical spine. 2. Anterior fusion plates at K7-Q2 and C5/C6/C7. No evidence for hardware loosening.   Emphysema (ICD10-J43.9).     Electronically Signed   By: Darliss Cheney M.D.   On: 08/12/2022 17:09  Above CT reviewed with Dr. Myer Haff. She is not fused at C3-C4. He recommends PT and follow up with him after. She should also quit smoking if she has restarted.   Will set up phone visit with her to review.

## 2022-09-06 NOTE — Progress Notes (Unsigned)
Telephone Visit- Progress Note: Referring Physician:  No referring provider defined for this encounter.  Primary Physician:  Ellyn Hack, MD  This visit was performed via telephone.  Patient location: home Provider location: office  I spent a total of 10 minutes non-face-to-face activities for this visit on the date of this encounter including review of current clinical condition and response to treatment.    Patient has given verbal consent to this telephone visits and we reviewed the limitations of a telephone visit. Patient wishes to proceed.    Chief Complaint:  neck pain  History of Present Illness: [name] is a 67 y.o. female who is almost 10 months status post ACDF C3-C4.   Last seen by me on 08/09/22 and she continued with constant neck pain.   Seen in ED on 08/12/22 for altered mental status. CT of cervical spine was done and phone visit scheduled to review it.   She continues with constant neck pain that radiates up into back of her head and also into both arms to her hands. She has numbness and tingling in her hands.     She's not sure about her medications and what she is taking.    Her walking was better after surgery, but she feels like it has gotten worse.      Exam: No exam done as this was a telephone encounter.     Imaging: CT of cervical spine dated 08/12/22:  FINDINGS: Alignment: Normal.   Skull base and vertebrae: No acute fracture. No primary bone lesion or focal pathologic process. Anterior fusion plates are seen at C3-C4 and C5/C6/C7. There is no evidence for hardware loosening.   Soft tissues and spinal canal: No prevertebral fluid or swelling. No visible canal hematoma.   Disc levels: There is moderate disc space narrowing at C2-C3 and C4-C5 compatible with degenerative change. There is mild multilevel neural foraminal stenosis throughout the mid and upper cervical spine. There is no significant central canal stenosis at any  level.   Upper chest: Mild emphysema.   Other: None.   IMPRESSION: 1. No acute fracture or traumatic subluxation of the cervical spine. 2. Anterior fusion plates at N0-U7 and C5/C6/C7. No evidence for hardware loosening.   Emphysema (ICD10-J43.9).     Electronically Signed   By: Darliss Cheney M.D.   On: 08/12/2022 17:09  I have personally reviewed the images and agree with the above interpretation.  CT reviewed with Dr. Myer Haff prior to her visit.   Assessment and Plan: [name] is a pleasant 67 y.o. female with constant neck pain that radiates up into back of her head and also into both arms to her hands. She has numbness and tingling in her hands.     She drops things occasionally, but hand are still better than they were preop. Her walking was better after surgery, but she feels like it has gotten worse.   CT reviewed with Dr. Deeann Dowse and shows that she is not fused at C3-C4.    Treatment options reviewed with patient and following plan made:    - PT orders sent to [facility] in [place].  - She was advised to quit smoking cigarettes- states she only smokes a few every 2-3 days.  - Keep follow up with outside pain management in [place].  - She has f/u with Dr. Myer Haff next week. This was pushed  back since she is just starting PT. She will see him in October. Will need xrays prior.  Drake Leach PA-C Neurosurgery

## 2022-09-07 ENCOUNTER — Telehealth: Payer: 59 | Admitting: Orthopedic Surgery

## 2022-09-07 ENCOUNTER — Encounter: Payer: Self-pay | Admitting: Orthopedic Surgery

## 2022-09-07 DIAGNOSIS — G959 Disease of spinal cord, unspecified: Secondary | ICD-10-CM

## 2022-09-07 DIAGNOSIS — Z981 Arthrodesis status: Secondary | ICD-10-CM | POA: Diagnosis not present

## 2022-09-07 DIAGNOSIS — M5412 Radiculopathy, cervical region: Secondary | ICD-10-CM

## 2022-09-15 ENCOUNTER — Telehealth: Payer: Self-pay | Admitting: Orthopedic Surgery

## 2022-09-15 DIAGNOSIS — M5412 Radiculopathy, cervical region: Secondary | ICD-10-CM

## 2022-09-15 DIAGNOSIS — M542 Cervicalgia: Secondary | ICD-10-CM

## 2022-09-15 DIAGNOSIS — Z981 Arthrodesis status: Secondary | ICD-10-CM

## 2022-09-15 NOTE — Telephone Encounter (Signed)
Patient notified that referral was sent.

## 2022-09-15 NOTE — Telephone Encounter (Signed)
PT orders done to [name] in [place].

## 2022-09-15 NOTE — Telephone Encounter (Signed)
-----   Message from Hawley C sent at 09/15/2022 10:42 AM EDT ----- Can you place a PT referral to [facility] in [place]. You mentioned it to the patient but no referral in her chart.

## 2022-09-16 ENCOUNTER — Ambulatory Visit: Payer: 59 | Admitting: Neurosurgery
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
## 2022-10-05 ENCOUNTER — Ambulatory Visit: Payer: 59 | Attending: Orthopaedic Surgery

## 2022-10-05 DIAGNOSIS — Z981 Arthrodesis status: Secondary | ICD-10-CM | POA: Diagnosis not present

## 2022-10-05 DIAGNOSIS — M542 Cervicalgia: Secondary | ICD-10-CM

## 2022-10-05 DIAGNOSIS — M5412 Radiculopathy, cervical region: Secondary | ICD-10-CM | POA: Insufficient documentation

## 2022-10-05 DIAGNOSIS — R293 Abnormal posture: Secondary | ICD-10-CM | POA: Diagnosis present

## 2022-10-05 DIAGNOSIS — M6281 Muscle weakness (generalized): Secondary | ICD-10-CM | POA: Diagnosis present

## 2022-10-11 ENCOUNTER — Telehealth: Payer: Self-pay | Admitting: Orthopaedic Surgery

## 2022-10-11 ENCOUNTER — Ambulatory Visit (INDEPENDENT_AMBULATORY_CARE_PROVIDER_SITE_OTHER): Payer: 59 | Admitting: Orthopaedic Surgery

## 2022-10-11 ENCOUNTER — Encounter: Payer: Self-pay | Admitting: Orthopaedic Surgery

## 2022-10-11 ENCOUNTER — Other Ambulatory Visit: Payer: Self-pay

## 2022-10-11 DIAGNOSIS — G8929 Other chronic pain: Secondary | ICD-10-CM | POA: Diagnosis not present

## 2022-10-11 DIAGNOSIS — M25512 Pain in left shoulder: Secondary | ICD-10-CM

## 2022-10-11 DIAGNOSIS — G959 Disease of spinal cord, unspecified: Secondary | ICD-10-CM

## 2022-10-11 DIAGNOSIS — M25511 Pain in right shoulder: Secondary | ICD-10-CM

## 2022-10-11 DIAGNOSIS — M542 Cervicalgia: Secondary | ICD-10-CM

## 2022-10-11 DIAGNOSIS — M5412 Radiculopathy, cervical region: Secondary | ICD-10-CM

## 2022-10-11 MED ORDER — LIDOCAINE HCL 1 % IJ SOLN
3.0000 mL | INTRAMUSCULAR | Status: AC | PRN
Start: 2022-10-11 — End: 2022-10-11
  Administered 2022-10-11: 3 mL

## 2022-10-11 MED ORDER — METHYLPREDNISOLONE ACETATE 40 MG/ML IJ SUSP
40.0000 mg | INTRAMUSCULAR | Status: AC | PRN
Start: 2022-10-11 — End: 2022-10-11
  Administered 2022-10-11: 40 mg via INTRA_ARTICULAR
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
## 2022-10-11 NOTE — Progress Notes (Signed)
The patient is well-known to [place].  She has chronic shoulder pain with both of her shoulders.  She does ambulate with a rolling walker.  She comes in requesting steroid injections in both shoulders today.  It has been over 36-month since her last injections.  She has been through therapy and continues therapy now.  She is not a diabetic.  She denies any acute changes in her medical status.  Both shoulders have pain throughout the arc of motion and show signs of impingement and tendinitis.  I did place a steroid injection in both shoulder subacromial outlet as per her wishes.  I told her to wait at least until January before considering repeat injections.  She did tolerate the injections well today.  All questions and concerns were answered and addressed.  Follow-up is as needed.    Procedure Note  Patient: [name]             Date of Birth: October 19, 1955           MRN: [id number]             Visit Date: 10/11/2022  Procedures: Visit Diagnoses:  1. Chronic right shoulder pain   2. Chronic left shoulder pain     Large Joint Inj: R subacromial bursa on 10/11/2022 1:04 PM Indications: pain and diagnostic evaluation Details: 22 G 1.5 in needle  Arthrogram: No  Medications: 3 mL lidocaine 1 %; 40 mg methylPREDNISolone acetate 40 MG/ML Outcome: tolerated well, no immediate complications Procedure, treatment alternatives, risks and benefits explained, specific risks discussed. Consent was given by the patient. Immediately prior to procedure a time out was called to verify the correct patient, procedure, equipment, support staff and site/side marked as required. Patient was prepped and draped in the usual sterile fashion.    Large Joint Inj: L subacromial bursa on 10/11/2022 1:04 PM Indications: pain and diagnostic evaluation Details: 22 G 1.5 in needle  Arthrogram: No  Medications: 3 mL lidocaine 1 %; 40 mg methylPREDNISolone acetate 40 MG/ML Outcome: tolerated well, no immediate  complications Procedure, treatment alternatives, risks and benefits explained, specific risks discussed. Consent was given by the patient. Immediately prior to procedure a time out was called to verify the correct patient, procedure, equipment, support staff and site/side marked as required. Patient was prepped and draped in the usual sterile fashion.

## 2022-10-13 IMAGING — CT CT NECK W/O CM
3 of 4 series · 13 of 33 positions shown, 16 images · non-contrast
Comparison: Cervical and brain MRI same day. Maxillofacial CT
06/12/2020. cervical MRI 05/12/2015. CT neck 09/15/2018.

CLINICAL DATA: Parotid region mass.  Neck pain.

EXAM:
CT NECK WITHOUT CONTRAST
TECHNIQUE: Multidetector CT imaging of the neck was performed following the
standard protocol without intravenous contrast.
RADIATION DOSE REDUCTION: This exam was performed according to the
departmental dose-optimization program which includes automated
exposure control, adjustment of the mA and/or kV according to
patient size and/or use of iterative reconstruction technique.

[Series 2: axial neck · axial · 0.62mm/px · z∈[-222,-94]mm · 5 of 98 slices shown, 7 images]
[im 17/98  soft-tissue]
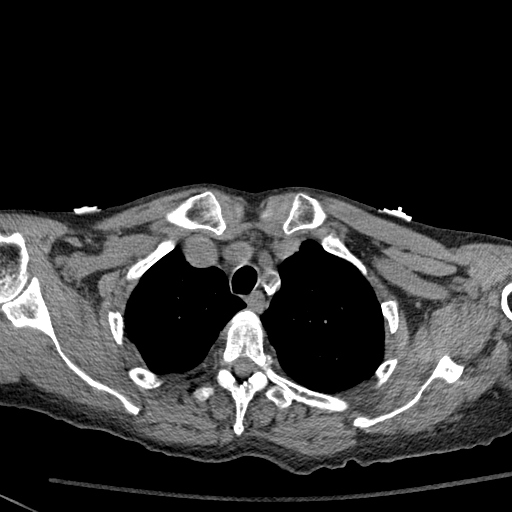
[im 17/98  bone]
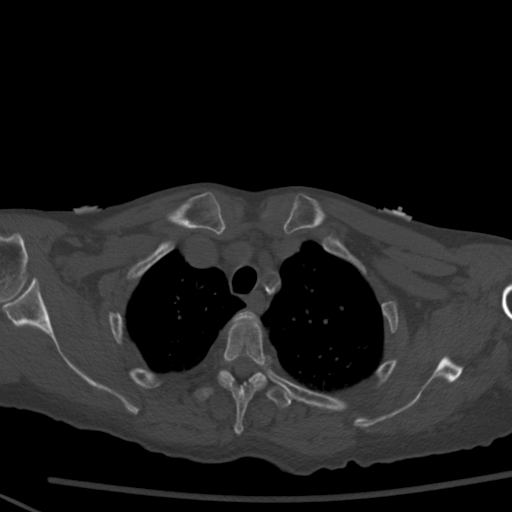
[im 33/98  bone]
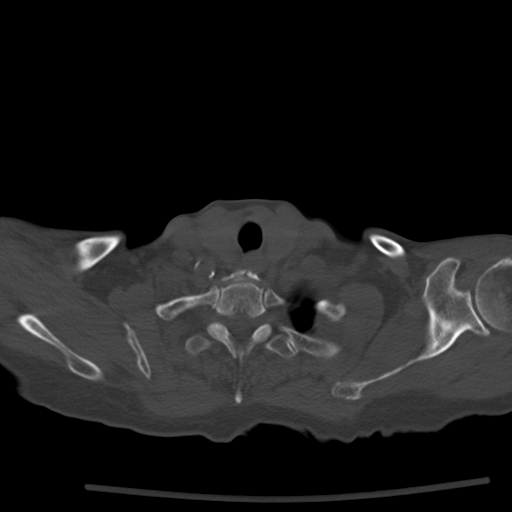
[im 49/98  bone]
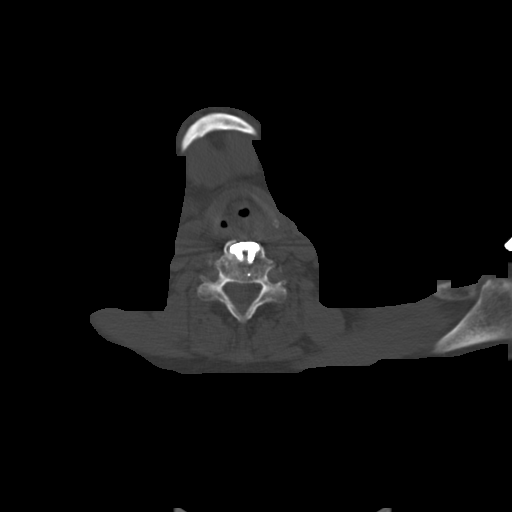
[im 65/98  bone]
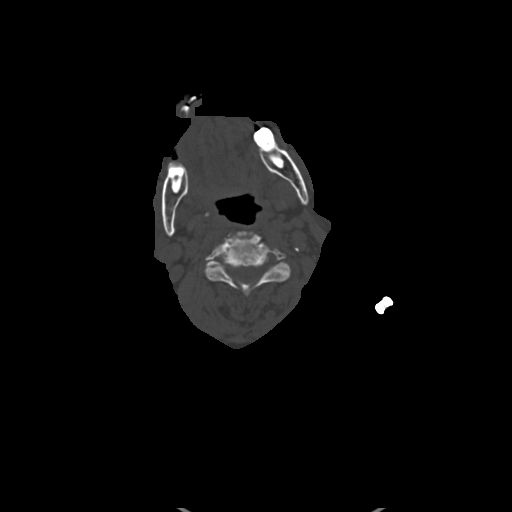
[im 81/98  soft-tissue]
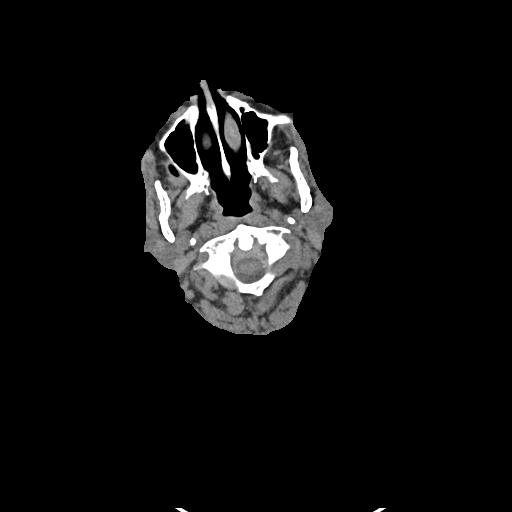
[im 81/98  bone]
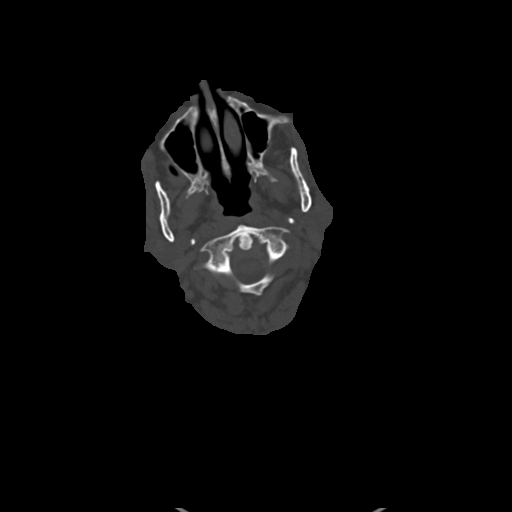

[Series 5: cor neck · coronal · 0.42mm/px · 3 of 96 slices shown]
[im 30/96  bone]
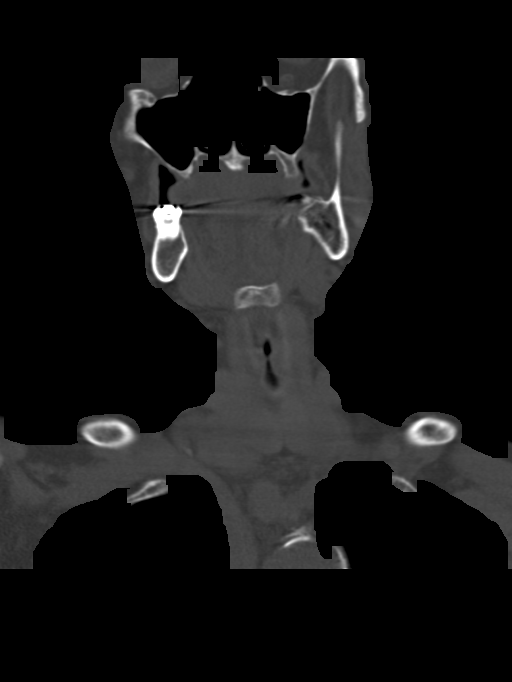
[im 42/96  bone]
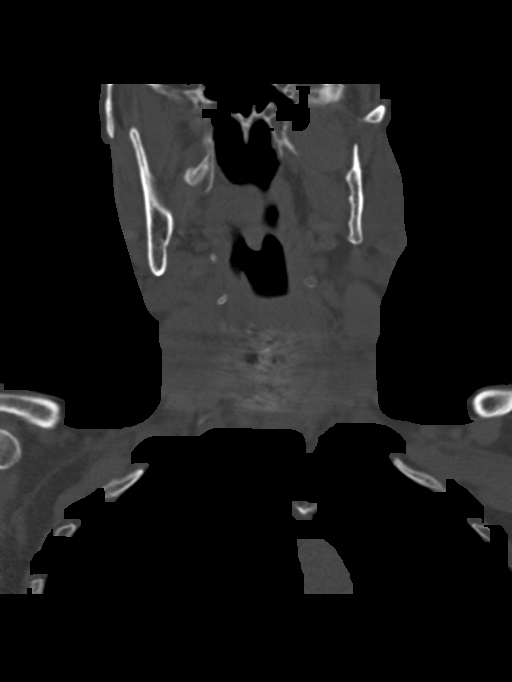
[im 54/96  bone]
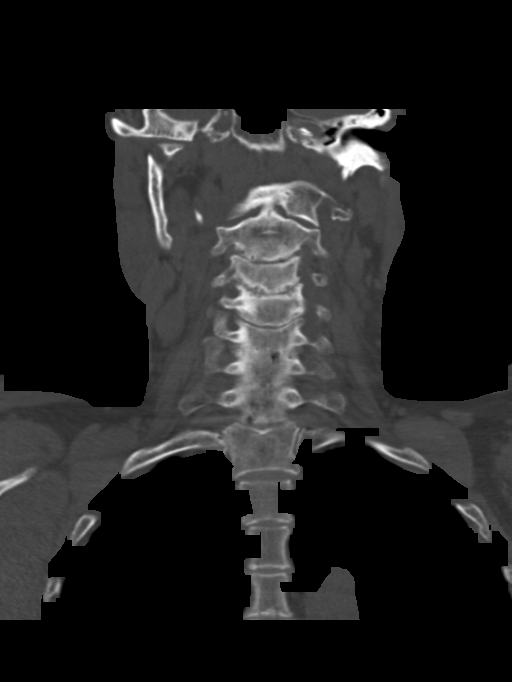

[Series 6: sag neck · sagittal · 0.45mm/px · 5 of 98 slices shown, 6 images]
[im 33/98  bone]
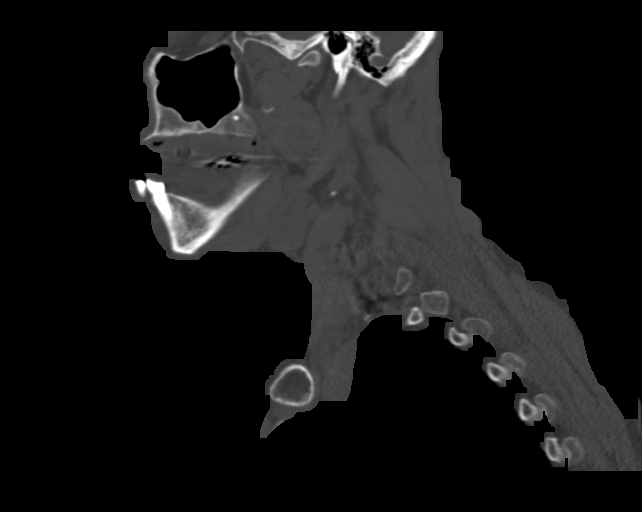
[im 41/98  bone]
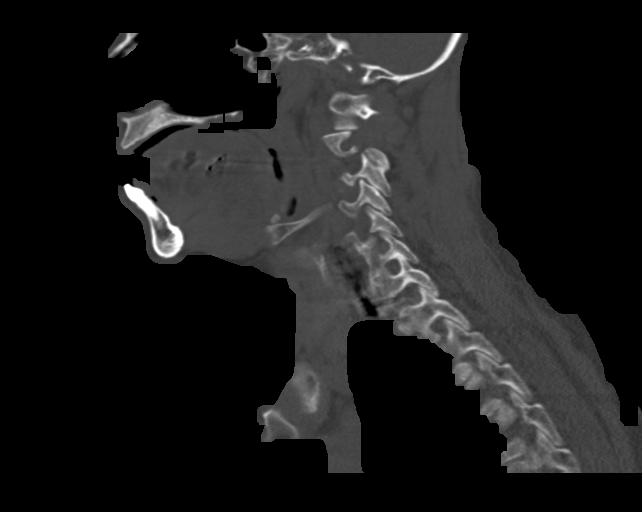
[im 49/98  soft-tissue]
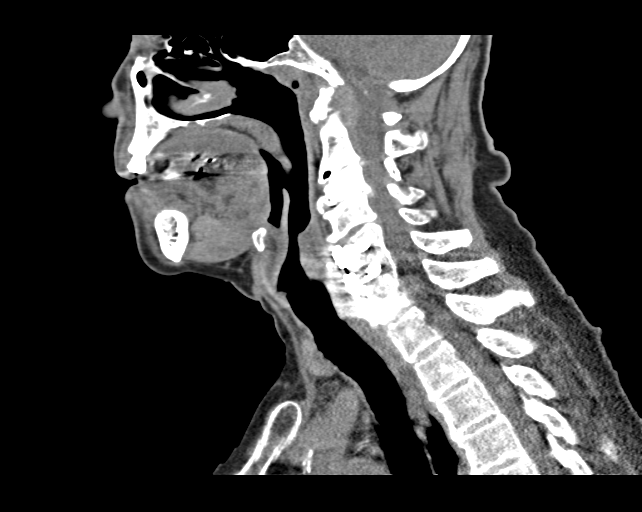
[im 49/98  bone]
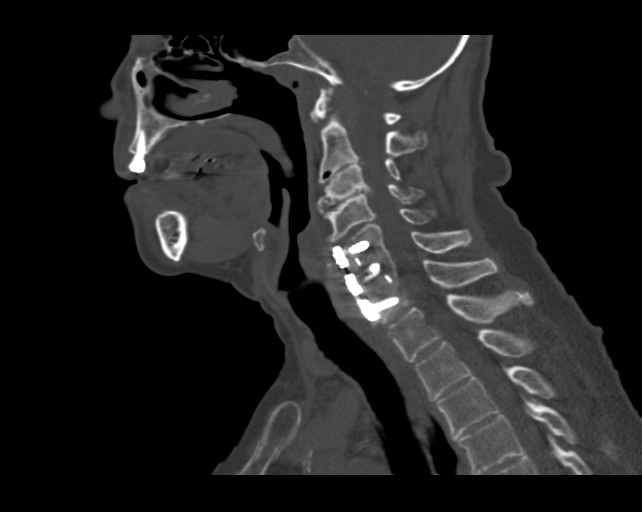
[im 57/98  bone]
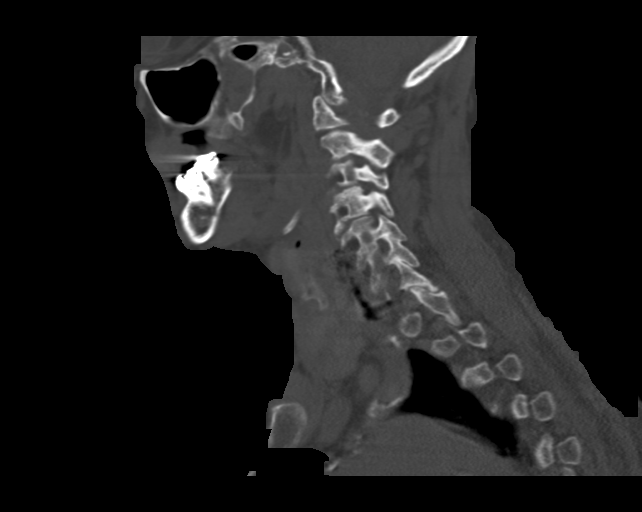
[im 65/98  bone]
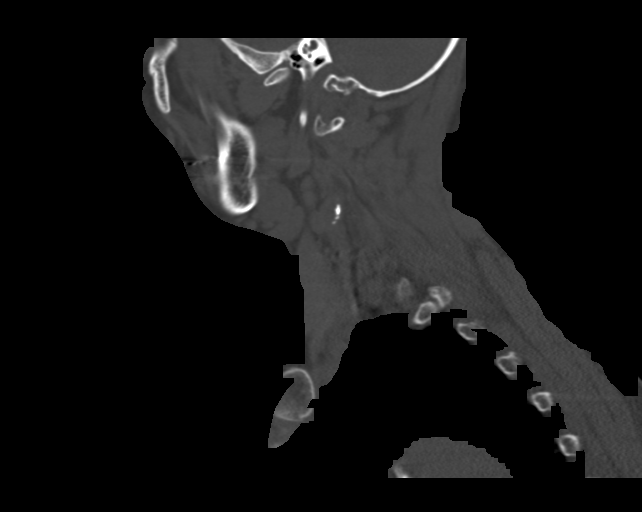

[13 of 33 positions shown; findings below may reference images not displayed]

FINDINGS: Pharynx and larynx: No mucosal or submucosal mass lesion.

Salivary glands: Submandibular glands are normal. No right parotid
abnormality visible without contrast. Within the left parotid gland,
there is subtle evidence of a 19 mm parotid mass, better shown on
prior studies listed above. Both of these were present in 2515 and
show slow enlargement. Right mass measured 8 mm at that time. Left
mass measured 12 mm at that time. Multiple slowly enlarging
well-circumscribed parotid masses are quite likely to be Warthin's
tumors.

Thyroid: Normal

Lymph nodes: No lymphadenopathy on either side of the neck.

Vascular: Atherosclerotic calcification at the carotid bifurcations.

Limited intracranial: Normal

Visualized orbits: Normal

Mastoids and visualized paranasal sinuses: Clear

Skeleton: Previous ACDF C5 through C7. Degenerative spondylosis
C2-3, C3-4, C4-5 and C7-T1.

Upper chest: Aortic atherosclerosis. Emphysema. Pulmonary scarring.

Other: None
IMPRESSION: This study done without contrast is of limited utility. However,
comparing with the MRI studies done earlier today and with CT and
MRI studies dating back as far as 05/12/2015, we can conclude that
there are multiple well circumscribed parotid masses, slowly
enlarging since 2515. This description is typical of or thin tumors.
The behavior would not be consistent with aggressive lesions.

## 2022-10-13 IMAGING — MR MR HEAD W/O CM
10 series · 48 of 48 positions shown · non-contrast
Comparison: CT head June 12, 2020.

CLINICAL DATA: Neuro deficit, acute, stroke suspected

EXAM:
MRI HEAD WITHOUT CONTRAST
TECHNIQUE: Multiplanar, multiecho pulse sequences of the brain and surrounding
structures were obtained without intravenous contrast.

[Series 6: DWI · axial · 3.0mm · 1.36mm/px · z∈[+33,+168]mm · 10 of 96 slices shown (1 of 4)]
[im 1/96]
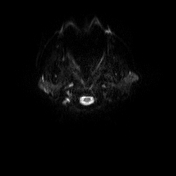
[im 11/96]
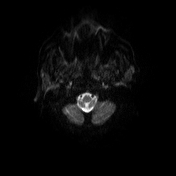
[im 22/96]
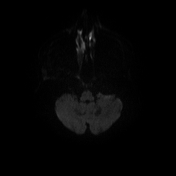
[im 32/96]
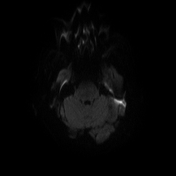
[im 43/96]
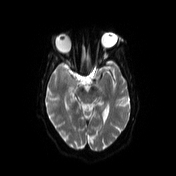
[im 53/96]
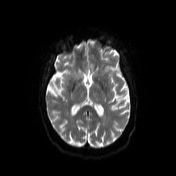
[im 64/96]
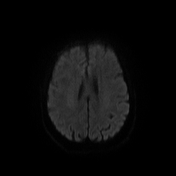
[im 74/96]
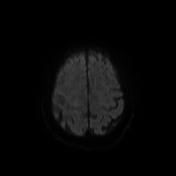
[im 85/96]
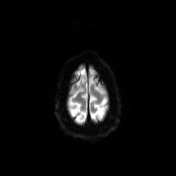
[im 96/96]
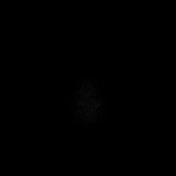

[Series 7: DWI · axial · 3.0mm · 1.36mm/px · z∈[+33,+168]mm · 5 of 48 slices shown (2 of 4)]
[im 1/48]
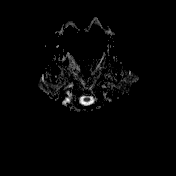
[im 12/48]
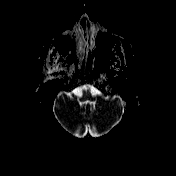
[im 24/48]
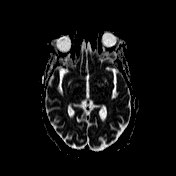
[im 36/48]
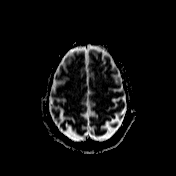
[im 48/48]
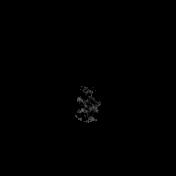

[Series 8: T1 · sagittal · 5.0mm · 0.75mm/px · 3 of 24 slices shown (1 of 2)]
[im 1/24]
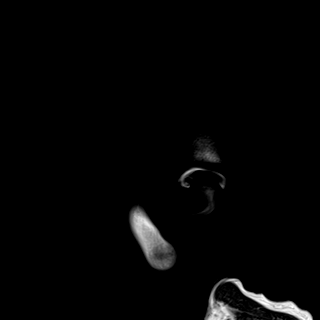
[im 12/24]
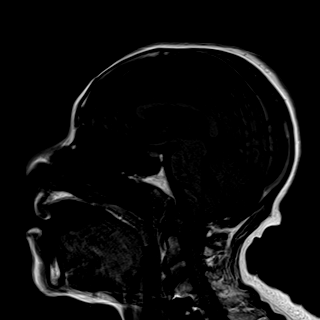
[im 24/24]
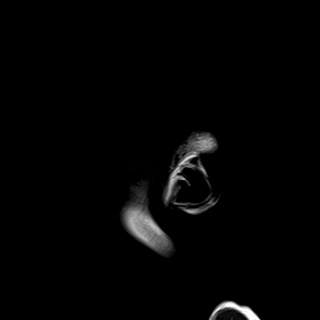

[Series 9: T2 · axial · 5.0mm · 0.62mm/px · z∈[+39,+178]mm · 3 of 24 slices shown (1 of 2)]
[im 1/24]
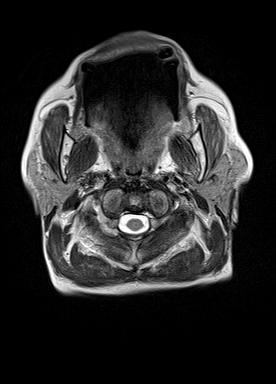
[im 12/24]
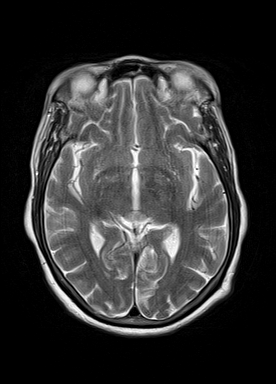
[im 24/24]
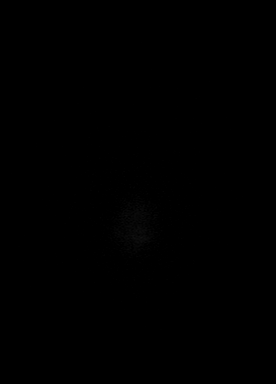

[Series 11: FLAIR · axial · 3.0mm · 0.86mm/px · z∈[+34,+174]mm · 5 of 51 slices shown]
[im 1/51]
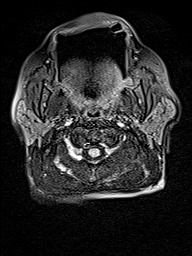
[im 13/51]
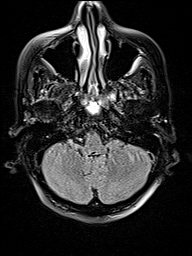
[im 26/51]
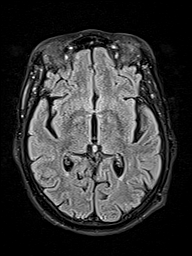
[im 38/51]
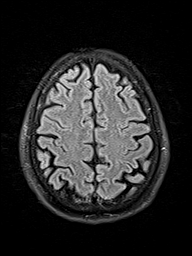
[im 51/51]
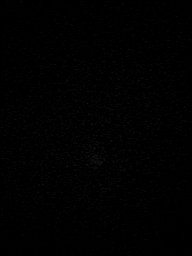

[Series 12: GRE · axial · 3.0mm · 0.45mm/px · z∈[+34,+173]mm · 5 of 51 slices shown]
[im 1/51]
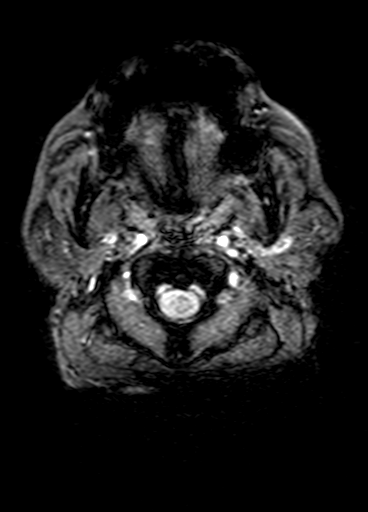
[im 13/51]
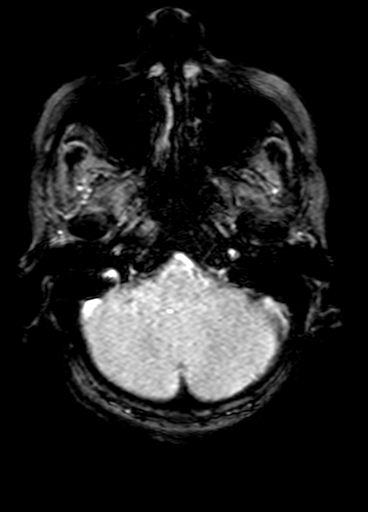
[im 26/51]
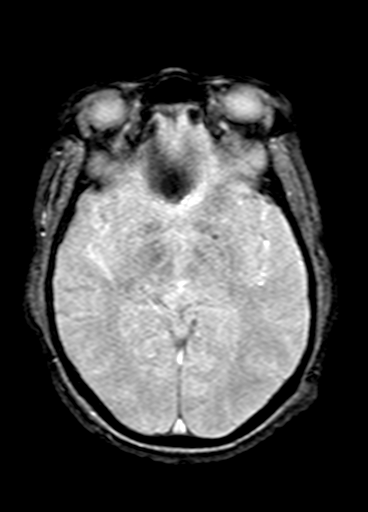
[im 38/51]
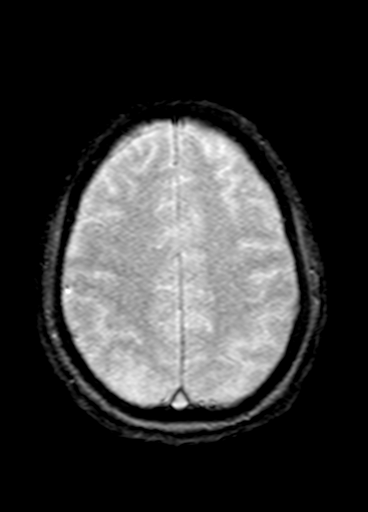
[im 51/51]
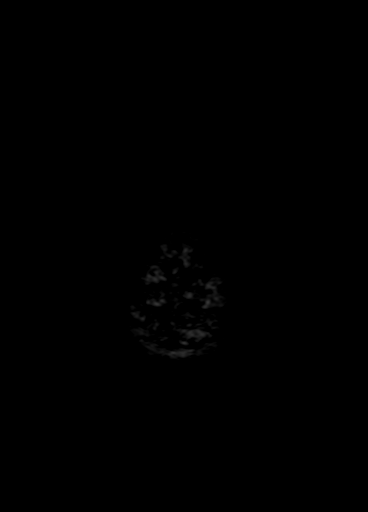

[Series 13: T1 · axial · 3.0mm · 0.45mm/px · z∈[+37,+176]mm · 5 of 51 slices shown (2 of 2)]
[im 1/51]
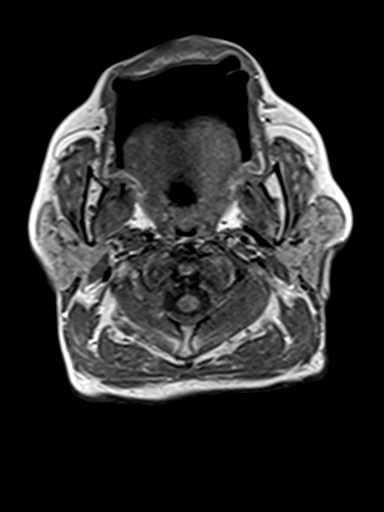
[im 13/51]
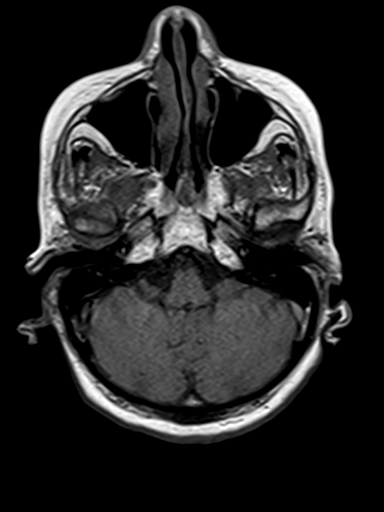
[im 26/51]
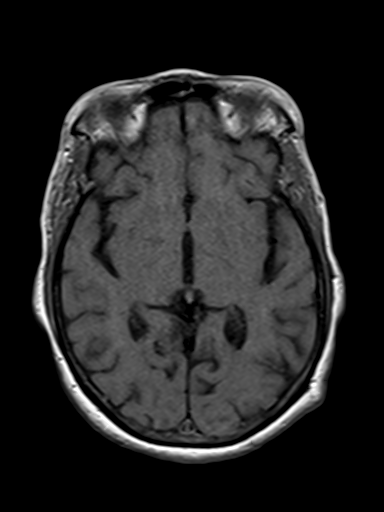
[im 38/51]
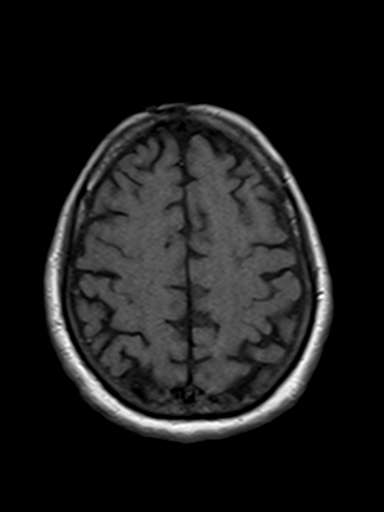
[im 51/51]
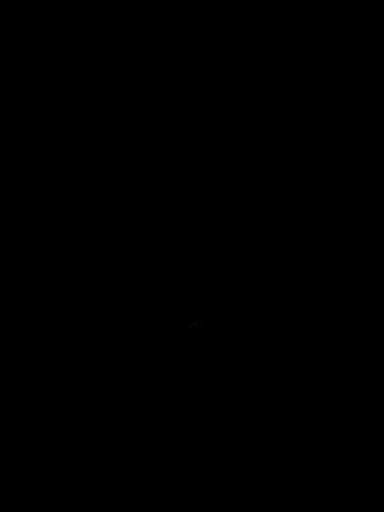

[Series 14: DWI · coronal · 5.0mm · 1.31mm/px · 6 of 56 slices shown (3 of 4)]
[im 1/56]
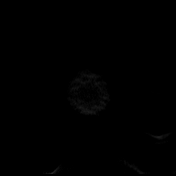
[im 12/56]
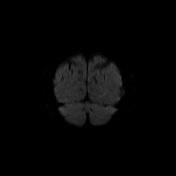
[im 23/56]
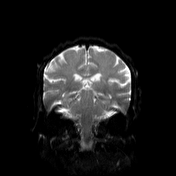
[im 34/56]
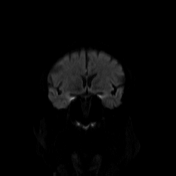
[im 45/56]
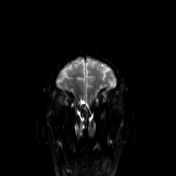
[im 56/56]
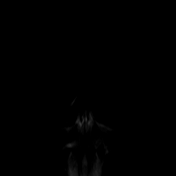

[Series 15: DWI · coronal · 5.0mm · 1.31mm/px · 3 of 28 slices shown (4 of 4)]
[im 1/28]
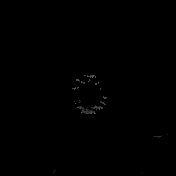
[im 14/28]
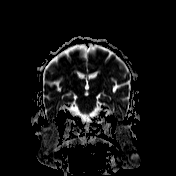
[im 28/28]
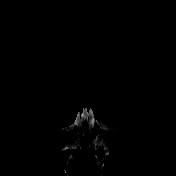

[Series 16: T2 · coronal · 5.0mm · 0.86mm/px · 3 of 26 slices shown (2 of 2)]
[im 1/26]
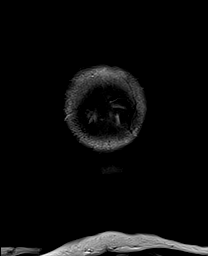
[im 13/26]
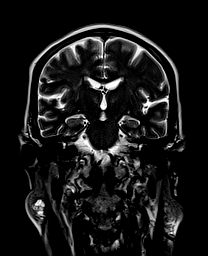
[im 26/26]
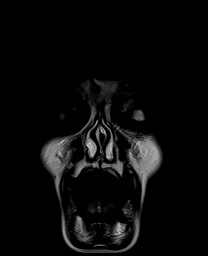

[48 of 48 positions shown; findings below may reference images not displayed]

FINDINGS: Brain: No acute infarction, hemorrhage, hydrocephalus, extra-axial
collection or mass lesion. Mild T2/FLAIR hyperintensities in the
white matter, nonspecific but compatible chronic microvascular
ischemic disease.

Vascular: Major arterial flow voids are maintained at the skull
base.

Skull and upper cervical spine: Normal marrow signal. Partially
imaged ACDF.

Sinuses/Orbits: Clear sinuses.  Unremarkable orbits.

Other: No mastoid effusions.
IMPRESSION: 1. No evidence of acute intracranial abnormality.
2. Mild chronic microvascular disease.

## 2022-10-13 IMAGING — MR MR CERVICAL SPINE W/O CM
5 of 6 series · 31 of 48 positions shown · non-contrast
Comparison: Cervical MRI 10/26/2016.  Cervical spine CT 06/12/2020

CLINICAL DATA: Cervical radiculopathy. History of cervical fusion.
Recent fall.

EXAM:
MRI CERVICAL SPINE WITHOUT CONTRAST
TECHNIQUE: Multiplanar, multisequence MR imaging of the cervical spine was
performed. No intravenous contrast was administered.

[Series 8: T1 · sagittal · 3.0mm · 0.69mm/px · 6 of 15 slices shown (1 of 2)]
[im 1/15]
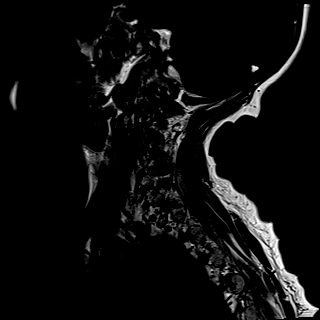
[im 3/15]
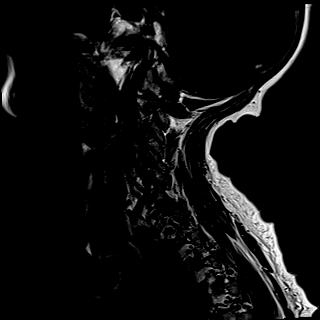
[im 6/15]
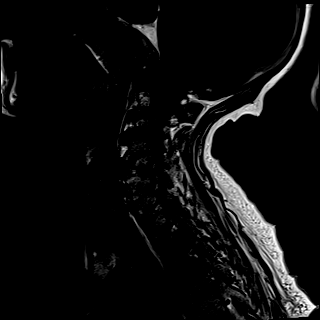
[im 9/15]
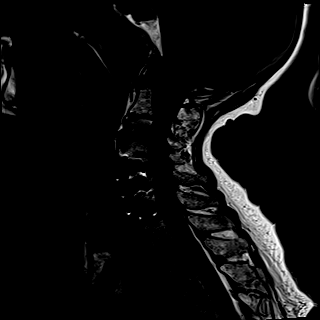
[im 12/15]
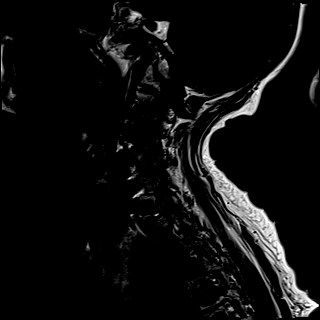
[im 15/15]
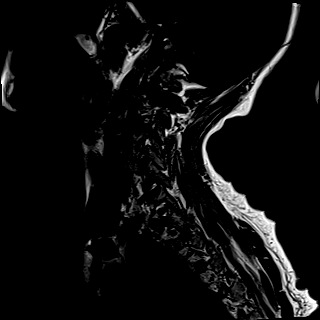

[Series 9: T2 · sagittal · 3.0mm · 0.69mm/px · 5 of 15 slices shown (1 of 2)]
[im 1/15]
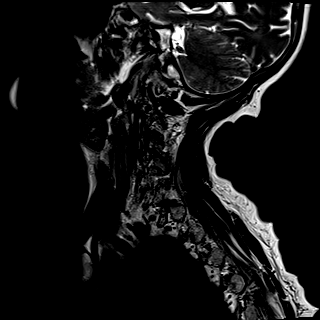
[im 4/15]
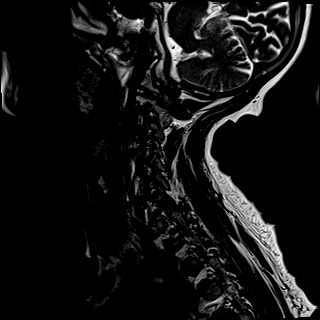
[im 8/15]
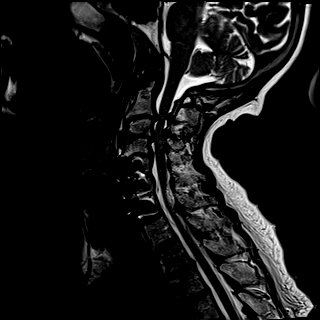
[im 11/15]
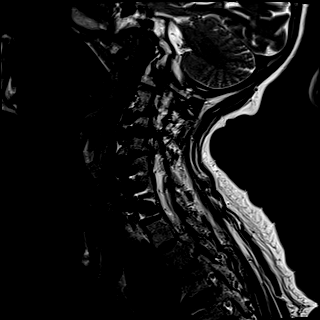
[im 15/15]
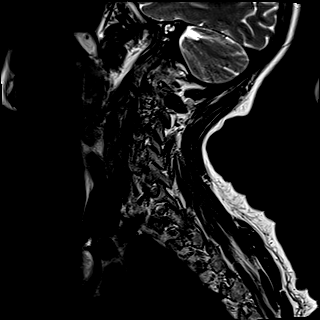

[Series 10: STIR · sagittal · 3.0mm · 0.86mm/px · 1 of 15 slices shown]
[im 1/15]
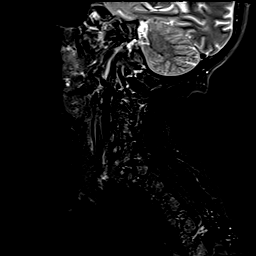

[Series 11: T2 · axial · 3.0mm · 0.70mm/px · z∈[-158,-57]mm · 8 of 29 slices shown (2 of 2)]
[im 1/29]
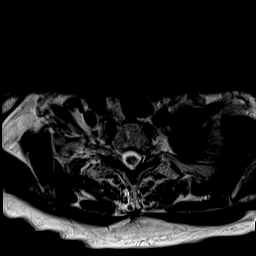
[im 4/29]
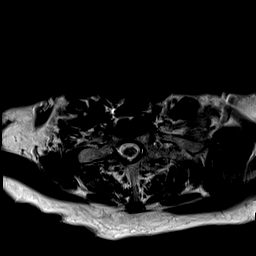
[im 10/29]
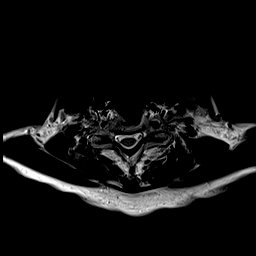
[im 13/29]
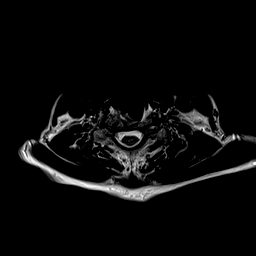
[im 16/29]
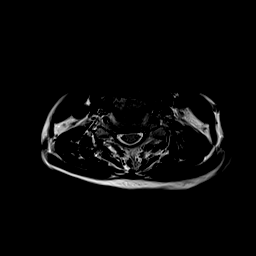
[im 19/29]
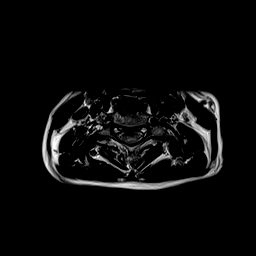
[im 25/29]
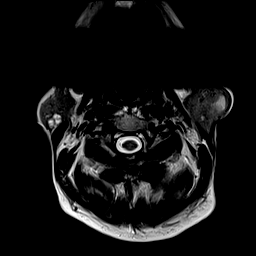
[im 29/29]
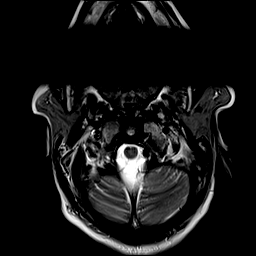

[Series 12: T1 · axial · 3.0mm · 0.35mm/px · z∈[-158,-57]mm · 11 of 30 slices shown (2 of 2)]
[im 1/30]
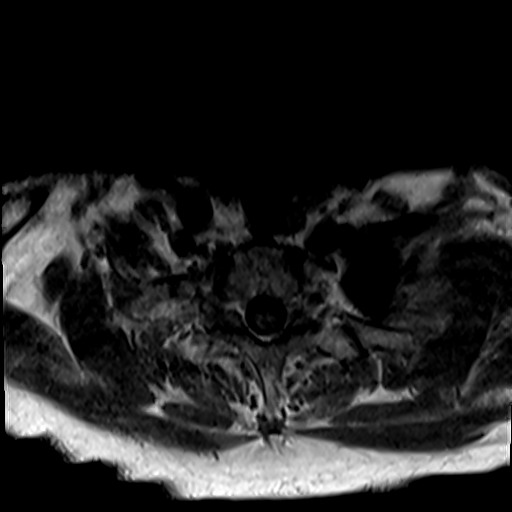
[im 3/30]
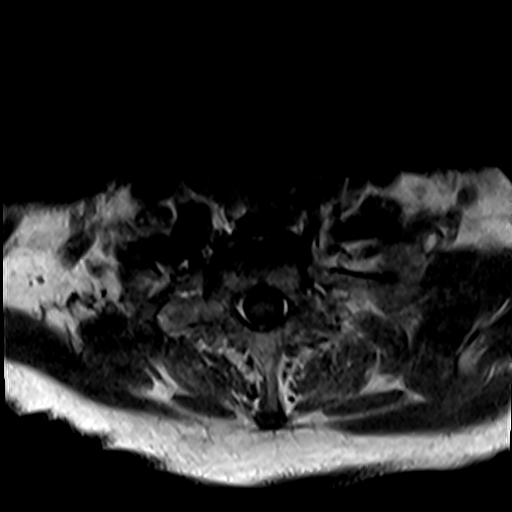
[im 6/30]
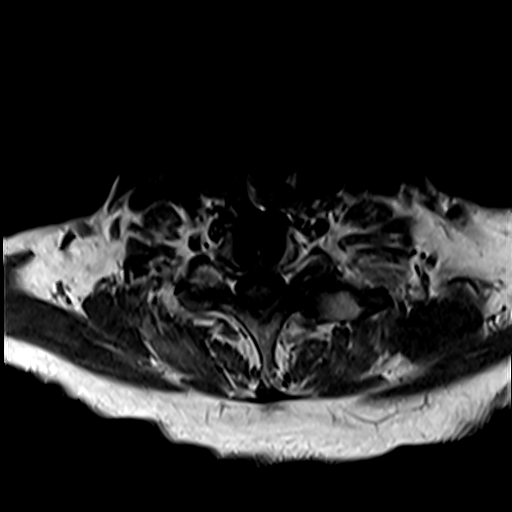
[im 9/30]
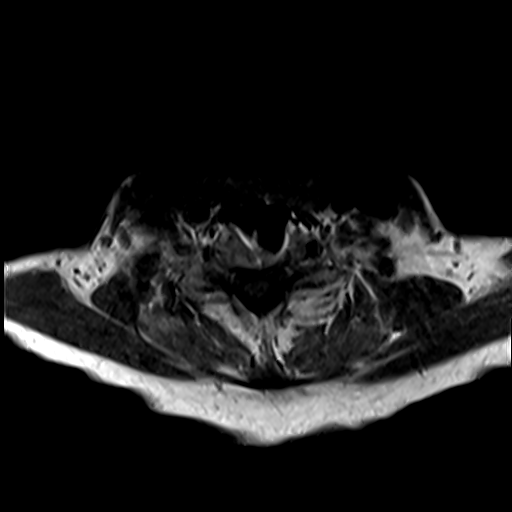
[im 12/30]
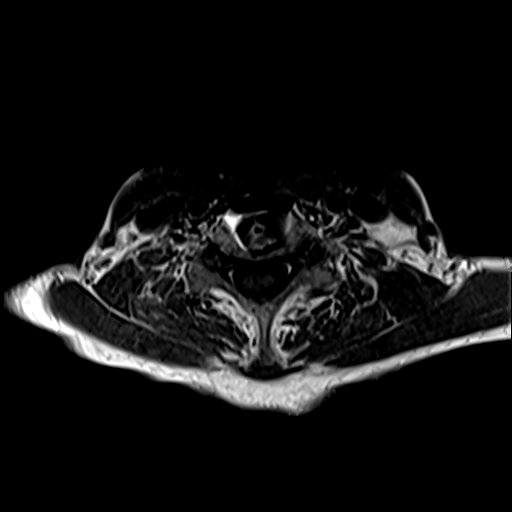
[im 15/30]
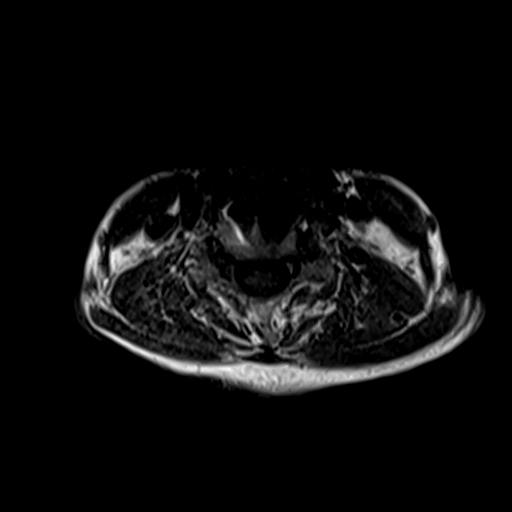
[im 18/30]
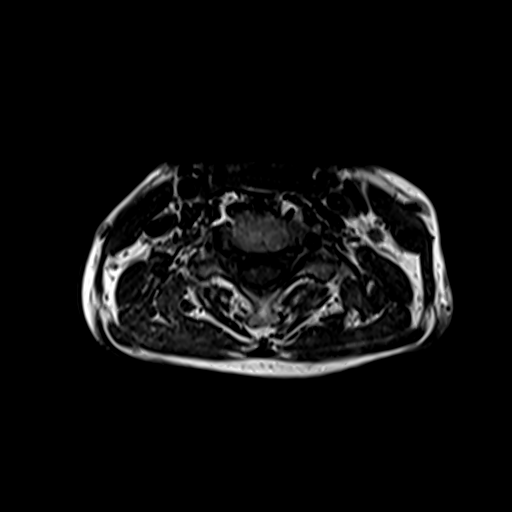
[im 21/30]
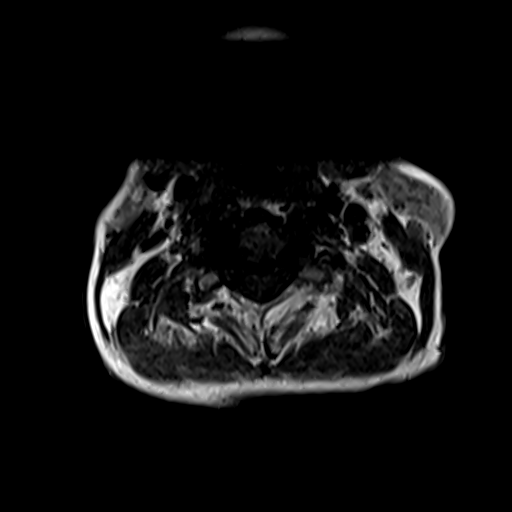
[im 24/30]
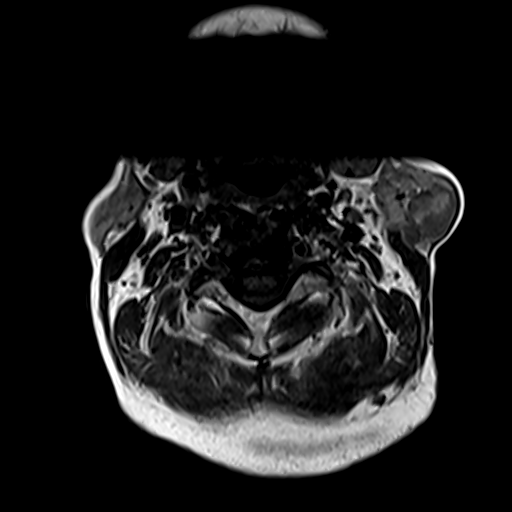
[im 27/30]
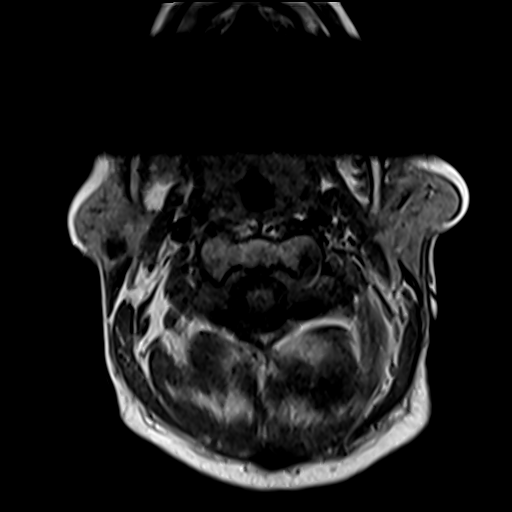
[im 30/30]
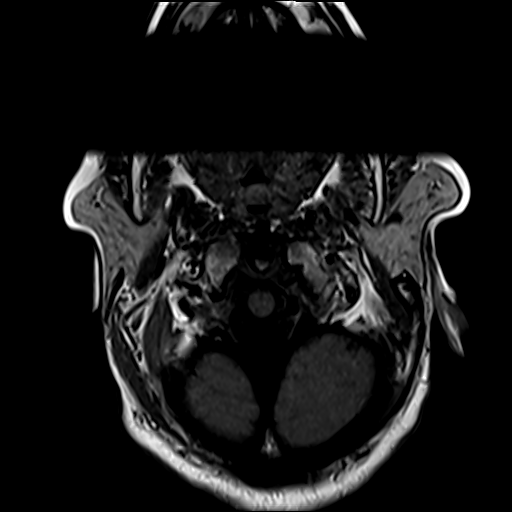

[31 of 48 positions shown; findings below may reference images not displayed]

FINDINGS: Alignment: Mild retrolisthesis C2-3 and C3-4.

Vertebrae: ACDF C5-6 and C6-7.  Negative for fracture or mass

Cord: Ill-defined cord hyperintensity throughout the cord at C3-4
with severe spinal stenosis. Cord signal abnormality was not present
in 8818 and there has been progressive spinal stenosis at this
level. Small cord hyperintensity posteriorly bilaterally at C4 not
seen previously. Central cord hyperintensity at C6-7 not seen
previously.

Posterior Fossa, vertebral arteries, paraspinal tissues: 9.5 mm
cystic lesion in the parotid tail on the right. This has enlarged in
the interval. No enlarged lymph nodes in the neck.

Disc levels:

C2-3: Extensive disc degeneration with disc space narrowing and
diffuse uncinate spurring. Cord flattening with mild spinal
stenosis. Moderate foraminal narrowing bilaterally. No interval
change

C3-4: Advanced disc degeneration with extensive diffuse uncinate
spurring. Ligamentum flavum hypertrophy. Severe spinal stenosis with
cord flattening and cord hyperintensity. Stenosis has progressed in
interval. Severe foraminal encroachment bilaterally

C4-5: Disc degeneration with diffuse uncinate spurring. Mild spinal
stenosis. Moderate foraminal stenosis bilaterally. No interval
change

C5-6: ACDF.  Negative for stenosis

C6-7: ACDF.  Negative for stenosis

C7-T1: Left facet degeneration.  Mild left foraminal narrowing.
IMPRESSION: 1. Mild spinal stenosis C2-3 with moderate foraminal stenosis
bilaterally unchanged
2. Severe spinal stenosis at C3-4 has progressed. There is now cord
signal hyperintensity which is ill-defined and could be edema due to
acute compressive myelopathy. Severe foraminal encroachment
bilaterally.
3. Mild spinal stenosis C4-5 with moderate foraminal encroachment
bilaterally. Small areas of cord hyperintensity posteriorly are new
since the prior study
4. ACDF with solid fusion C5-6 and C6-7. New area of cord
hyperintensity centrally at the C7 level.
5. 9.5 mm cystic mass in the parotid tail on the right. Possible
Warthin's tumor. Recommend CT neck with intravenous contrast for
further evaluation.

## 2022-10-14 ENCOUNTER — Ambulatory Visit: Payer: 59 | Admitting: Neurosurgery

## 2022-10-14 ENCOUNTER — Ambulatory Visit: Payer: 59 | Attending: Orthopaedic Surgery

## 2022-10-14 ENCOUNTER — Encounter: Payer: Self-pay | Admitting: Neurosurgery

## 2022-10-14 VITALS — BP 136/84 | Ht 66.0 in | Wt 181.0 lb

## 2022-10-14 DIAGNOSIS — M6281 Muscle weakness (generalized): Secondary | ICD-10-CM | POA: Diagnosis present

## 2022-10-14 DIAGNOSIS — G959 Disease of spinal cord, unspecified: Secondary | ICD-10-CM | POA: Diagnosis not present

## 2022-10-14 DIAGNOSIS — R293 Abnormal posture: Secondary | ICD-10-CM | POA: Insufficient documentation

## 2022-10-14 DIAGNOSIS — Z981 Arthrodesis status: Secondary | ICD-10-CM | POA: Diagnosis not present

## 2022-10-14 DIAGNOSIS — M542 Cervicalgia: Secondary | ICD-10-CM | POA: Insufficient documentation

## 2022-10-14 NOTE — Progress Notes (Signed)
   Progress Note: Referring Physician:  Ellyn Hack, MD [address] [place],  [place] [address]  Primary Physician:  Veronica Hack, MD  Chief Complaint:  neck pain  DOS: 11/02/21    C3-4 ACDF   History of Present Illness: [name] is a 67 y.o. female who is status post ACDF C3-C4.  She continues to have some neck pain.  She is also having headaches.  She is being seen in the [place] pain clinic.  She has symptoms of chronic myelopathy.  She is using a motorized wheelchair.  She has quit smoking.  She is using nicotine patches.  She is having some swallowing difficulty but is able to get food down.    Exam: Vitals:   10/14/22 1123  BP: 136/84    5/5 throughout BUE except R biceps and deltoid (4+) and L deltoid (4+)    Imaging: CT of cervical spine dated 08/12/22:  FINDINGS: Alignment: Normal.   Skull base and vertebrae: No acute fracture. No primary bone lesion or focal pathologic process. Anterior fusion plates are seen at C3-C4 and C5/C6/C7. There is no evidence for hardware loosening.   Soft tissues and spinal canal: No prevertebral fluid or swelling. No visible canal hematoma.   Disc levels: There is moderate disc space narrowing at C2-C3 and C4-C5 compatible with degenerative change. There is mild multilevel neural foraminal stenosis throughout the mid and upper cervical spine. There is no significant central canal stenosis at any level.   Upper chest: Mild emphysema.   Other: None.   IMPRESSION: 1. No acute fracture or traumatic subluxation of the cervical spine. 2. Anterior fusion plates at U0-A5 and C5/C6/C7. No evidence for hardware loosening.   Emphysema (ICD10-J43.9).     Electronically Signed   By: [name] M.D.   On: 08/12/2022 17:09  I have personally reviewed the images and agree with the above interpretation.  CT reviewed with Dr. Myer Blanchard prior to her visit.   Assessment and Plan: [name] is a pleasant 67 y.o.  female with chronic myelopathy and continued neck pain after neck surgery.  I will defer her pain management to [facility] pain clinic.  I will see her back on an as-needed basis.  I think she is likely stable from her chronic myelopathy at this point.    I spent a total of 15 minutes in this patient's care today. This time was spent reviewing pertinent records including imaging studies, obtaining and confirming history, performing a directed evaluation, formulating and discussing my recommendations, and documenting the visit within the medical record.    Veronica Night MD Neurosurgery
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
## 2022-10-20 ENCOUNTER — Other Ambulatory Visit: Payer: Self-pay | Admitting: Family Medicine

## 2022-10-20 ENCOUNTER — Ambulatory Visit: Payer: 59

## 2022-10-20 DIAGNOSIS — M6281 Muscle weakness (generalized): Secondary | ICD-10-CM

## 2022-10-20 DIAGNOSIS — M542 Cervicalgia: Secondary | ICD-10-CM

## 2022-10-20 DIAGNOSIS — R1030 Lower abdominal pain, unspecified: Secondary | ICD-10-CM

## 2022-10-20 DIAGNOSIS — R293 Abnormal posture: Secondary | ICD-10-CM

## 2022-10-20 DIAGNOSIS — R102 Pelvic and perineal pain: Secondary | ICD-10-CM

## 2022-10-20 NOTE — Telephone Encounter (Signed)
Error

## 2022-10-22 ENCOUNTER — Other Ambulatory Visit: Payer: 59
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
## 2022-10-26 ENCOUNTER — Ambulatory Visit: Payer: 59

## 2022-10-28 ENCOUNTER — Ambulatory Visit
Admission: RE | Admit: 2022-10-28 | Discharge: 2022-10-28 | Disposition: A | Discharge status: Home / Self Care | Payer: 59 | Source: Ambulatory Visit | Attending: Family Medicine | Admitting: Family Medicine

## 2022-10-28 DIAGNOSIS — R102 Pelvic and perineal pain: Secondary | ICD-10-CM

## 2022-10-28 DIAGNOSIS — R1030 Lower abdominal pain, unspecified: Secondary | ICD-10-CM
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
## 2022-11-02 ENCOUNTER — Ambulatory Visit: Payer: 59

## 2022-11-03 ENCOUNTER — Telehealth: Payer: Self-pay

## 2022-11-03 DIAGNOSIS — Z91041 Radiographic dye allergy status: Secondary | ICD-10-CM

## 2022-11-03 DIAGNOSIS — N390 Urinary tract infection, site not specified: Secondary | ICD-10-CM | POA: Diagnosis not present

## 2022-11-03 DIAGNOSIS — I10 Essential (primary) hypertension: Secondary | ICD-10-CM | POA: Diagnosis present

## 2022-11-03 DIAGNOSIS — M069 Rheumatoid arthritis, unspecified: Secondary | ICD-10-CM | POA: Diagnosis present

## 2022-11-03 DIAGNOSIS — G8929 Other chronic pain: Secondary | ICD-10-CM | POA: Diagnosis present

## 2022-11-03 DIAGNOSIS — N179 Acute kidney failure, unspecified: Secondary | ICD-10-CM | POA: Diagnosis present

## 2022-11-03 DIAGNOSIS — Z79891 Long term (current) use of opiate analgesic: Secondary | ICD-10-CM

## 2022-11-03 DIAGNOSIS — D649 Anemia, unspecified: Secondary | ICD-10-CM | POA: Diagnosis present

## 2022-11-03 DIAGNOSIS — K219 Gastro-esophageal reflux disease without esophagitis: Secondary | ICD-10-CM | POA: Diagnosis present

## 2022-11-03 DIAGNOSIS — Z981 Arthrodesis status: Secondary | ICD-10-CM

## 2022-11-03 DIAGNOSIS — Z885 Allergy status to narcotic agent status: Secondary | ICD-10-CM

## 2022-11-03 DIAGNOSIS — N3000 Acute cystitis without hematuria: Principal | ICD-10-CM | POA: Diagnosis present

## 2022-11-03 DIAGNOSIS — B9620 Unspecified Escherichia coli [E. coli] as the cause of diseases classified elsewhere: Secondary | ICD-10-CM | POA: Diagnosis present

## 2022-11-03 DIAGNOSIS — Z882 Allergy status to sulfonamides status: Secondary | ICD-10-CM

## 2022-11-03 DIAGNOSIS — Z888 Allergy status to other drugs, medicaments and biological substances status: Secondary | ICD-10-CM

## 2022-11-03 DIAGNOSIS — Z8249 Family history of ischemic heart disease and other diseases of the circulatory system: Secondary | ICD-10-CM

## 2022-11-03 DIAGNOSIS — F1990 Other psychoactive substance use, unspecified, uncomplicated: Secondary | ICD-10-CM | POA: Diagnosis present

## 2022-11-03 DIAGNOSIS — E782 Mixed hyperlipidemia: Secondary | ICD-10-CM | POA: Diagnosis present

## 2022-11-03 DIAGNOSIS — J449 Chronic obstructive pulmonary disease, unspecified: Secondary | ICD-10-CM | POA: Diagnosis present

## 2022-11-03 DIAGNOSIS — G9341 Metabolic encephalopathy: Secondary | ICD-10-CM | POA: Diagnosis present

## 2022-11-03 DIAGNOSIS — Z79899 Other long term (current) drug therapy: Secondary | ICD-10-CM

## 2022-11-03 DIAGNOSIS — F17210 Nicotine dependence, cigarettes, uncomplicated: Secondary | ICD-10-CM | POA: Diagnosis present

## 2022-11-03 DIAGNOSIS — E876 Hypokalemia: Secondary | ICD-10-CM | POA: Diagnosis not present

## 2022-11-03 DIAGNOSIS — Z825 Family history of asthma and other chronic lower respiratory diseases: Secondary | ICD-10-CM

## 2022-11-03 DIAGNOSIS — R0902 Hypoxemia: Secondary | ICD-10-CM | POA: Diagnosis not present

## 2022-11-03 NOTE — Telephone Encounter (Signed)
TC on 11/02/22 due to missed visit.  VM left regarding next appointment time and reminder of attendance policy.

## 2022-11-04 ENCOUNTER — Emergency Department (HOSPITAL_COMMUNITY): Payer: 59

## 2022-11-04 ENCOUNTER — Inpatient Hospital Stay (HOSPITAL_COMMUNITY)
Admission: EM | Admit: 2022-11-04 | Discharge: 2022-11-08 | DRG: 689 | Disposition: A | Discharge status: Home / Self Care | Payer: 59 | Attending: Internal Medicine | Admitting: Internal Medicine

## 2022-11-04 ENCOUNTER — Other Ambulatory Visit: Payer: Self-pay

## 2022-11-04 ENCOUNTER — Encounter (HOSPITAL_COMMUNITY): Payer: Self-pay

## 2022-11-04 DIAGNOSIS — R109 Unspecified abdominal pain: Secondary | ICD-10-CM | POA: Diagnosis present

## 2022-11-04 DIAGNOSIS — I10 Essential (primary) hypertension: Secondary | ICD-10-CM | POA: Diagnosis present

## 2022-11-04 DIAGNOSIS — N179 Acute kidney failure, unspecified: Secondary | ICD-10-CM | POA: Diagnosis present

## 2022-11-04 DIAGNOSIS — J449 Chronic obstructive pulmonary disease, unspecified: Secondary | ICD-10-CM | POA: Diagnosis present

## 2022-11-04 DIAGNOSIS — E876 Hypokalemia: Secondary | ICD-10-CM | POA: Insufficient documentation

## 2022-11-04 DIAGNOSIS — R140 Abdominal distension (gaseous): Secondary | ICD-10-CM | POA: Diagnosis not present

## 2022-11-04 DIAGNOSIS — M069 Rheumatoid arthritis, unspecified: Secondary | ICD-10-CM | POA: Diagnosis present

## 2022-11-04 DIAGNOSIS — Z981 Arthrodesis status: Secondary | ICD-10-CM | POA: Diagnosis not present

## 2022-11-04 DIAGNOSIS — Z91041 Radiographic dye allergy status: Secondary | ICD-10-CM | POA: Diagnosis not present

## 2022-11-04 DIAGNOSIS — N3000 Acute cystitis without hematuria: Secondary | ICD-10-CM | POA: Diagnosis present

## 2022-11-04 DIAGNOSIS — R0902 Hypoxemia: Secondary | ICD-10-CM | POA: Diagnosis not present

## 2022-11-04 DIAGNOSIS — F17210 Nicotine dependence, cigarettes, uncomplicated: Secondary | ICD-10-CM | POA: Diagnosis present

## 2022-11-04 DIAGNOSIS — F1990 Other psychoactive substance use, unspecified, uncomplicated: Secondary | ICD-10-CM | POA: Diagnosis present

## 2022-11-04 DIAGNOSIS — Z79899 Other long term (current) drug therapy: Secondary | ICD-10-CM | POA: Diagnosis not present

## 2022-11-04 DIAGNOSIS — Z882 Allergy status to sulfonamides status: Secondary | ICD-10-CM | POA: Diagnosis not present

## 2022-11-04 DIAGNOSIS — N390 Urinary tract infection, site not specified: Secondary | ICD-10-CM | POA: Diagnosis present

## 2022-11-04 DIAGNOSIS — Z8249 Family history of ischemic heart disease and other diseases of the circulatory system: Secondary | ICD-10-CM | POA: Diagnosis not present

## 2022-11-04 DIAGNOSIS — G9340 Encephalopathy, unspecified: Secondary | ICD-10-CM

## 2022-11-04 DIAGNOSIS — Z825 Family history of asthma and other chronic lower respiratory diseases: Secondary | ICD-10-CM | POA: Diagnosis not present

## 2022-11-04 DIAGNOSIS — G8929 Other chronic pain: Secondary | ICD-10-CM | POA: Diagnosis present

## 2022-11-04 DIAGNOSIS — Z79891 Long term (current) use of opiate analgesic: Secondary | ICD-10-CM | POA: Diagnosis not present

## 2022-11-04 DIAGNOSIS — Z888 Allergy status to other drugs, medicaments and biological substances status: Secondary | ICD-10-CM | POA: Diagnosis not present

## 2022-11-04 DIAGNOSIS — E782 Mixed hyperlipidemia: Secondary | ICD-10-CM | POA: Diagnosis present

## 2022-11-04 DIAGNOSIS — D649 Anemia, unspecified: Secondary | ICD-10-CM | POA: Diagnosis present

## 2022-11-04 DIAGNOSIS — B9620 Unspecified Escherichia coli [E. coli] as the cause of diseases classified elsewhere: Secondary | ICD-10-CM | POA: Diagnosis present

## 2022-11-04 DIAGNOSIS — G9341 Metabolic encephalopathy: Secondary | ICD-10-CM | POA: Diagnosis present

## 2022-11-04 DIAGNOSIS — K219 Gastro-esophageal reflux disease without esophagitis: Secondary | ICD-10-CM | POA: Diagnosis present

## 2022-11-04 DIAGNOSIS — Z885 Allergy status to narcotic agent status: Secondary | ICD-10-CM | POA: Diagnosis not present

## 2022-11-04 DIAGNOSIS — N3001 Acute cystitis with hematuria: Principal | ICD-10-CM

## 2022-11-04 LAB — URINALYSIS, ROUTINE W REFLEX MICROSCOPIC
Bilirubin Urine: NEGATIVE
Glucose, UA: NEGATIVE mg/dL
Ketones, ur: NEGATIVE mg/dL
Nitrite: NEGATIVE
Specific Gravity, Urine: 1.01 (ref 1.005–1.030)
pH: 6 (ref 5.0–8.0)

## 2022-11-04 LAB — CBC
HCT: 38.2 % (ref 36.0–46.0)
Hemoglobin: 11.9 g/dL — ABNORMAL LOW (ref 12.0–15.0)
MCH: 27.2 pg (ref 26.0–34.0)
MCHC: 31.2 g/dL (ref 30.0–36.0)
MCV: 87.4 fL (ref 80.0–100.0)
Platelets: 365 10*3/uL (ref 150–400)
RBC: 4.37 MIL/uL (ref 3.87–5.11)
RDW: 15.6 % — ABNORMAL HIGH (ref 11.5–15.5)
WBC: 7.1 10*3/uL (ref 4.0–10.5)
nRBC: 0 % (ref 0.0–0.2)

## 2022-11-04 LAB — URINALYSIS, MICROSCOPIC (REFLEX): WBC, UA: >50 WBC/hpf (ref 0–5)

## 2022-11-04 LAB — LIPASE, BLOOD: Lipase: 23 U/L (ref 11–51)

## 2022-11-04 LAB — COMPREHENSIVE METABOLIC PANEL
ALT: 9 U/L (ref 0–44)
AST: 12 U/L — ABNORMAL LOW (ref 15–41)
Albumin: 3.3 g/dL — ABNORMAL LOW (ref 3.5–5.0)
Alkaline Phosphatase: 93 U/L (ref 38–126)
Anion gap: 10 (ref 5–15)
BUN: 21 mg/dL (ref 8–23)
CO2: 24 mmol/L (ref 22–32)
Calcium: 8.3 mg/dL — ABNORMAL LOW (ref 8.9–10.3)
Chloride: 102 mmol/L (ref 98–111)
Creatinine, Ser: 1.53 mg/dL — ABNORMAL HIGH (ref 0.44–1.00)
GFR, Estimated: 37 mL/min — ABNORMAL LOW (ref >60)
Glucose, Bld: 98 mg/dL (ref 70–99)
Potassium: 3.3 mmol/L — ABNORMAL LOW (ref 3.5–5.1)
Sodium: 136 mmol/L (ref 135–145)
Total Bilirubin: 0.5 mg/dL (ref 0.3–1.2)
Total Protein: 7.2 g/dL (ref 6.5–8.1)

## 2022-11-04 LAB — TROPONIN I (HIGH SENSITIVITY)
Troponin I (High Sensitivity): 13 ng/L (ref <18)
Troponin I (High Sensitivity): 13 ng/L (ref <18)

## 2022-11-04 MED ORDER — TRAZODONE HCL 50 MG PO TABS
25.0000 mg | ORAL_TABLET | Freq: Every evening | ORAL | Status: DC | PRN
  Administered 2022-11-04: 25 mg via ORAL
  Filled 2022-11-04: qty 1

## 2022-11-04 MED ORDER — PROMETHAZINE HCL 25 MG PO TABS: 12.5000 mg | ORAL_TABLET | Freq: Four times a day (QID) | ORAL | Status: DC | PRN

## 2022-11-04 MED ORDER — SODIUM CHLORIDE 0.9 % IV BOLUS
1000.0000 mL | Freq: Once | INTRAVENOUS | Status: AC
Start: 2022-11-04 — End: 2022-11-04
  Administered 2022-11-04: 1000 mL via INTRAVENOUS

## 2022-11-04 MED ORDER — OXYCODONE-ACETAMINOPHEN 10-325 MG PO TABS: 1.0000 | ORAL_TABLET | Freq: Four times a day (QID) | ORAL | Status: DC | PRN

## 2022-11-04 MED ORDER — AMITRIPTYLINE HCL 25 MG PO TABS
50.0000 mg | ORAL_TABLET | Freq: Every day | ORAL | Status: DC
  Administered 2022-11-04 – 2022-11-07 (×4): 50 mg via ORAL
  Filled 2022-11-04 (×4): qty 2

## 2022-11-04 MED ORDER — SENNOSIDES-DOCUSATE SODIUM 8.6-50 MG PO TABS
2.0000 | ORAL_TABLET | Freq: Every evening | ORAL | Status: DC | PRN
  Filled 2022-11-04: qty 2

## 2022-11-04 MED ORDER — ALBUTEROL SULFATE (2.5 MG/3ML) 0.083% IN NEBU: 2.5000 mg | INHALATION_SOLUTION | RESPIRATORY_TRACT | Status: DC | PRN

## 2022-11-04 MED ORDER — OXYCODONE-ACETAMINOPHEN 5-325 MG PO TABS
1.0000 | ORAL_TABLET | Freq: Four times a day (QID) | ORAL | Status: DC | PRN
  Administered 2022-11-04 – 2022-11-08 (×12): 1 via ORAL
  Filled 2022-11-04 (×12): qty 1

## 2022-11-04 MED ORDER — NITROFURANTOIN MONOHYD MACRO 100 MG PO CAPS: 100.0000 mg | ORAL_CAPSULE | Freq: Two times a day (BID) | ORAL | 0 refills | Status: DC

## 2022-11-04 MED ORDER — SODIUM CHLORIDE 0.9 % IV SOLN
1.0000 g | Freq: Once | INTRAVENOUS | Status: AC
  Administered 2022-11-04: 1 g via INTRAVENOUS
  Filled 2022-11-04: qty 10

## 2022-11-04 MED ORDER — ACETAMINOPHEN 325 MG PO TABS
650.0000 mg | ORAL_TABLET | Freq: Four times a day (QID) | ORAL | Status: DC | PRN
  Administered 2022-11-04 – 2022-11-06 (×2): 650 mg via ORAL
  Filled 2022-11-04 (×2): qty 2

## 2022-11-04 MED ORDER — ATORVASTATIN CALCIUM 40 MG PO TABS
40.0000 mg | ORAL_TABLET | Freq: Every day | ORAL | Status: DC
  Administered 2022-11-04 – 2022-11-07 (×4): 40 mg via ORAL
  Filled 2022-11-04 (×4): qty 1

## 2022-11-04 MED ORDER — LORAZEPAM 2 MG/ML IJ SOLN
1.0000 mg | Freq: Once | INTRAMUSCULAR | Status: AC
  Administered 2022-11-04: 1 mg via INTRAVENOUS
  Filled 2022-11-04: qty 1

## 2022-11-04 MED ORDER — SODIUM CHLORIDE 0.9 % IV SOLN
1.0000 g | INTRAVENOUS | Status: AC
  Administered 2022-11-05 – 2022-11-08 (×4): 1 g via INTRAVENOUS
  Filled 2022-11-04 (×4): qty 10

## 2022-11-04 MED ORDER — PANTOPRAZOLE SODIUM 40 MG PO TBEC
40.0000 mg | DELAYED_RELEASE_TABLET | Freq: Every day | ORAL | Status: DC
  Administered 2022-11-04 – 2022-11-08 (×5): 40 mg via ORAL
  Filled 2022-11-04 (×5): qty 1

## 2022-11-04 MED ORDER — SODIUM CHLORIDE 0.9 % IV SOLN: INTRAVENOUS | Status: AC

## 2022-11-04 MED ORDER — ACETAMINOPHEN 650 MG RE SUPP: 650.0000 mg | Freq: Four times a day (QID) | RECTAL | Status: DC | PRN

## 2022-11-04 MED ORDER — CARVEDILOL 3.125 MG PO TABS
6.2500 mg | ORAL_TABLET | Freq: Two times a day (BID) | ORAL | Status: DC
  Administered 2022-11-04 – 2022-11-08 (×8): 6.25 mg via ORAL
  Filled 2022-11-04 (×8): qty 2

## 2022-11-04 MED ORDER — AMLODIPINE BESYLATE 5 MG PO TABS
10.0000 mg | ORAL_TABLET | Freq: Every day | ORAL | Status: DC
  Administered 2022-11-04 – 2022-11-08 (×5): 10 mg via ORAL
  Filled 2022-11-04 (×5): qty 2
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
## 2022-11-04 NOTE — ED Provider Notes (Signed)
Accepted handoff at shift change from [facility]. Please see prior provider note for more detail.   Briefly: Patient is 67 y.o.   DDX: concern for Abdominal pain, UTI, going home on macrobid. Gave her ativan and she was quite out of it. Needs reassessed, but okay to discharge.   Plan: On reassessment patient still somewhat altered although more arousable than overnight.  She has previously had encephalopathy associated with UTI given her AKI, significant UTI, poor home care I do think that she would benefit from hospital admission for UTI, progressing to pyelonephritis, encephalopathy, AKI.  I spoke with the hospitalist, Dr. [name] Crigler who agrees to admission at this time.   [place] 11/04/22 2956    Linwood Dibbles, MD 11/04/22 [phone]

## 2022-11-04 NOTE — Plan of Care (Addendum)
Patient admitted to 1613 complaining of general abdominal pain, skin intact although patient says her bottom is sore. Awaiting for pharmacy verifications of meds and new orders to be placed.   Problem: Education: Goal: Knowledge of General Education information will improve Description: Including pain rating scale, medication(s)/side effects and non-pharmacologic comfort measures Outcome: Progressing   Problem: Health Behavior/Discharge Planning: Goal: Ability to manage health-related needs will improve Outcome: Progressing   Problem: Clinical Measurements: Goal: Ability to maintain clinical measurements within normal limits will improve Outcome: Progressing Goal: Will remain free from infection Outcome: Progressing Goal: Diagnostic test results will improve Outcome: Progressing Goal: Respiratory complications will improve Outcome: Progressing Goal: Cardiovascular complication will be avoided Outcome: Progressing   Problem: Activity: Goal: Risk for activity intolerance will decrease Outcome: Progressing   Problem: Nutrition: Goal: Adequate nutrition will be maintained Outcome: Progressing   Problem: Coping: Goal: Level of anxiety will decrease Outcome: Progressing   Problem: Elimination: Goal: Will not experience complications related to bowel motility Outcome: Progressing Goal: Will not experience complications related to urinary retention Outcome: Progressing   Problem: Pain Management: Goal: General experience of comfort will improve Outcome: Progressing   Problem: Safety: Goal: Ability to remain free from injury will improve Outcome: Progressing   Problem: Skin Integrity: Goal: Risk for impaired skin integrity will decrease Outcome: Progressing
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
## 2022-11-04 NOTE — Plan of Care (Signed)
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
## 2022-11-04 NOTE — ED Notes (Signed)
On the way back from Xray, Patient was found falling asleep in the wheelchair.
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
## 2022-11-04 NOTE — ED Notes (Signed)
Called down to lab to start urine culture with urine sample, lab confirmed.

## 2022-11-04 NOTE — Discharge Instructions (Addendum)
You were diagnosed today with a urinary tract infection.  Please take the prescribed antibiotics.  He also had decreased kidney function today.  Please be sure to drink plenty of water at home and follow-up with your primary care provider.  You should have repeat labs to check your kidney function in approximately 1 week.

## 2022-11-05 DIAGNOSIS — G9341 Metabolic encephalopathy: Secondary | ICD-10-CM

## 2022-11-05 DIAGNOSIS — E876 Hypokalemia: Secondary | ICD-10-CM | POA: Insufficient documentation

## 2022-11-05 DIAGNOSIS — N3000 Acute cystitis without hematuria: Secondary | ICD-10-CM | POA: Diagnosis not present

## 2022-11-05 LAB — CBC
HCT: 31 % — ABNORMAL LOW (ref 36.0–46.0)
Hemoglobin: 9.7 g/dL — ABNORMAL LOW (ref 12.0–15.0)
MCH: 27.6 pg (ref 26.0–34.0)
MCHC: 31.3 g/dL (ref 30.0–36.0)
MCV: 88.1 fL (ref 80.0–100.0)
Platelets: 307 10*3/uL (ref 150–400)
RBC: 3.52 MIL/uL — ABNORMAL LOW (ref 3.87–5.11)
RDW: 15.8 % — ABNORMAL HIGH (ref 11.5–15.5)
WBC: 5 10*3/uL (ref 4.0–10.5)
nRBC: 0 % (ref 0.0–0.2)

## 2022-11-05 LAB — BASIC METABOLIC PANEL
Anion gap: 7 (ref 5–15)
BUN: 14 mg/dL (ref 8–23)
CO2: 23 mmol/L (ref 22–32)
Calcium: 8 mg/dL — ABNORMAL LOW (ref 8.9–10.3)
Chloride: 112 mmol/L — ABNORMAL HIGH (ref 98–111)
Creatinine, Ser: 0.89 mg/dL (ref 0.44–1.00)
GFR, Estimated: >60 mL/min (ref >60)
Glucose, Bld: 97 mg/dL (ref 70–99)
Potassium: 3.4 mmol/L — ABNORMAL LOW (ref 3.5–5.1)
Sodium: 142 mmol/L (ref 135–145)

## 2022-11-05 MED ORDER — SODIUM CHLORIDE 0.9 % IV BOLUS
250.0000 mL | Freq: Once | INTRAVENOUS | Status: AC
  Administered 2022-11-05: 250 mL via INTRAVENOUS

## 2022-11-05 MED ORDER — HYDROXYZINE HCL 25 MG PO TABS
25.0000 mg | ORAL_TABLET | Freq: Three times a day (TID) | ORAL | Status: DC | PRN
  Administered 2022-11-05 – 2022-11-08 (×5): 25 mg via ORAL
  Filled 2022-11-05 (×5): qty 1

## 2022-11-05 NOTE — Assessment & Plan Note (Signed)
Continue Protonix °

## 2022-11-05 NOTE — Assessment & Plan Note (Signed)
-   Continue amlodipine ?

## 2022-11-05 NOTE — Assessment & Plan Note (Signed)
-   points to lower mid abdomen essentially suprapubic area - Possible chronic component but for now will see response with UTI treatment; actually improved with aggressive laxative regimen.  Encouraged her to continue laxative regimen at home - CT further reviewed; does have stool burden and she claims last BM ~1 week ago but doesn't seem bothered by this; also has hiatal hernia noted on CT but she is pointing to much lower

## 2022-11-05 NOTE — Assessment & Plan Note (Signed)
-  Continue Lipitor °

## 2022-11-05 NOTE — Assessment & Plan Note (Signed)
-   Baseline hemoglobin appears to be around 10 to 11 g/dL

## 2022-11-05 NOTE — Assessment & Plan Note (Signed)
Repleted. °

## 2022-11-05 NOTE — Evaluation (Signed)
Physical Therapy Evaluation Patient Details Name: [name] MRN: [id number] DOB: [birth year] Today's Date: 11/05/2022  History of Present Illness  67 yo female presents to therapy following hospital admission on 11/04/2022 due to encephalopathy attributed to UTI. Pt PMH includes but is not limited to: CKD III, COPD, RA, HTN, GERD, cervical myelopathy s/p ACDF and tobacco abuse.  Clinical Impression     Pt admitted with above diagnosis.  Pt currently with functional limitations due to the deficits listed below (see PT Problem List). Pt in bed when PT arrived. Pt agreeable to therapy intervention. Pt is a poor historian and able to provide minimal insight to PLOF. Pt states her niece can help her out some at home and that she has a PCA. Pt reports she uses her motorized wc because she has had so many falls in home setting. Pt required use of hospital bed and S for supine to sit, CGA for SPT bed to recliner and no AD. Pt left seated in recliner and all needs in place. Pt will benefit from acute skilled PT to increase their independence and safety with mobility to allow discharge.         If plan is discharge home, recommend the following: A lot of help with walking and/or transfers;A little help with bathing/dressing/bathroom;Assistance with cooking/housework;Assist for transportation;Help with stairs or ramp for entrance   Can travel by private vehicle        Equipment Recommendations None recommended by PT  Recommendations for Other Services       Functional Status Assessment Patient has had a recent decline in their functional status and demonstrates the ability to make significant improvements in function in a reasonable and predictable amount of time.     Precautions / Restrictions Precautions Precautions: Fall Restrictions Weight Bearing Restrictions: No      Mobility  Bed Mobility Overal bed mobility: Needs Assistance Bed Mobility: Supine to Sit     Supine to sit:  Supervision, HOB elevated, Used rails     General bed mobility comments: min cues for initiation    Transfers Overall transfer level: Needs assistance Equipment used: None Transfers: Bed to chair/wheelchair/BSC   Stand pivot transfers: Contact guard assist         General transfer comment: pt required min cues, states she does not use RW for transfers in home setting required min cues for safety    Ambulation/Gait               General Gait Details: NT due to pt report of limited to no amb at baseline, at end of eval pt then reported she would like to be able to walk again  Stairs            Wheelchair Mobility     Tilt Bed    Modified Rankin (Stroke Patients Only)       Balance Overall balance assessment: History of Falls, Needs assistance Sitting-balance support: Feet supported Sitting balance-Leahy Scale: Good     Standing balance support: No upper extremity supported Standing balance-Leahy Scale: Fair                               Pertinent Vitals/Pain Pain Assessment Pain Assessment: No/denies pain (no reports of abdominal pain at time of eval pt sated just feels like a ball)    Home Living Family/patient expects to be discharged to:: Private residence Living Arrangements: Alone Available Help at Discharge: Family;Available PRN/intermittently  Type of Home: Apartment Home Access: Level entry;Elevator       Home Layout: One level Home Equipment: Cane - single point;Shower seat;Grab bars - tub/shower;Hand held shower head;Grab bars - toilet;Hospital bed;Wheelchair - Surveyor, quantity (2 wheels)      Prior Function Prior Level of Function : Needs assist       Physical Assist : Mobility (physical);ADLs (physical) Mobility (physical): Gait ADLs (physical): IADLs Mobility Comments: pt report uses her hoveround in the apt and when going longer distances due to falls. has RW and minimal amb in apartment.  pt endorses recent  fall ADLs Comments: pt is a poor historian and reported that she might have some assist from family and has a PCA 2 hrs/day 5 d/wk, majoirity of PLOF taken from prior admission     Extremity/Trunk Assessment        Lower Extremity Assessment Lower Extremity Assessment: Generalized weakness    Cervical / Trunk Assessment Cervical / Trunk Assessment: Neck Surgery  Communication   Communication Communication: Difficulty communicating thoughts/reduced clarity of speech  Cognition Arousal: Alert Behavior During Therapy: Flat affect Overall Cognitive Status: Impaired/Different from baseline Area of Impairment: Attention, Memory                     Memory: Decreased short-term memory         General Comments: pt is a poor historian and no family present        General Comments      Exercises     Assessment/Plan    PT Assessment Patient needs continued PT services  PT Problem List Decreased strength;Decreased activity tolerance;Decreased balance;Decreased mobility;Decreased safety awareness       PT Treatment Interventions DME instruction;Gait training;Functional mobility training;Therapeutic activities;Therapeutic exercise;Balance training;Neuromuscular re-education;Patient/family education    PT Goals (Current goals can be found in the Care Plan section)  Acute Rehab PT Goals Patient Stated Goal: to be able to walk again and get rid of the ball in my stomach PT Goal Formulation: With patient Time For Goal Achievement: 11/19/22 Potential to Achieve Goals: Good    Frequency Min 1X/week     Co-evaluation               AM-PAC PT "6 Clicks" Mobility  Outcome Measure Help needed turning from your back to your side while in a flat bed without using bedrails?: None Help needed moving from lying on your back to sitting on the side of a flat bed without using bedrails?: A Little Help needed moving to and from a bed to a chair (including a wheelchair)?: A  Little Help needed standing up from a chair using your arms (e.g., wheelchair or bedside chair)?: A Little Help needed to walk in hospital room?: Total Help needed climbing 3-5 steps with a railing? : Total 6 Click Score: 15    End of Session Equipment Utilized During Treatment: Gait belt Activity Tolerance: Patient limited by fatigue Patient left: in chair;with call bell/phone within reach Nurse Communication: Mobility status PT Visit Diagnosis: Unsteadiness on feet (R26.81);Other abnormalities of gait and mobility (R26.89);Repeated falls (R29.6)    Time: 1610-9604 PT Time Calculation (min) (ACUTE ONLY): 13 min   Charges:   PT Evaluation $PT Eval Low Complexity: 1 Low   PT General Charges $$ ACUTE PT VISIT: 1 Visit         Johnny Bridge, PT Acute Rehab   Jacqualyn Posey 11/05/2022, 1:48 PM

## 2022-11-05 NOTE — Progress Notes (Signed)
   11/04/22 2346 11/05/22 0012  Vitals  BP (!) 94/57 (!) 92/54 (hospitalist notified)  MAP (mmHg) 70 67   BP soft this evening, patient reports asymptomatic. Notified Hospitalist, 250 cc bolus NS ordered along with continued maintenance fluids. BP recheck 109/63 (78), HR 70. Patient reports no complaints at this time.

## 2022-11-05 NOTE — Assessment & Plan Note (Signed)
-   UA noted with moderate LE, negative nitrite, greater than 50 WBC, many bacteria - Patient poor historian due to altered mentation concerning for UTI symptom - Follow-up urine culture; growing E. coli.  Sensitivities reviewed - s/p Rocephin; 4-day course completed in hospital

## 2022-11-05 NOTE — Hospital Course (Addendum)
Ms. Blake is a 67 yo female with PMH COPD, RA, HTN who presented to the hospital with altered mentation.  Family ordered a well check when the patient became confused on the phone. She was mostly complaining of left lower quadrant abdominal pain and was brought to the ER for further evaluation.  She was unable to provide much collateral information. Has history of cervical myelopathy, status post ACDF 1 year ago and now is on chronic narcotics per [facility] pain clinic.  UA was concerning for UTI and she was started on antibiotics and admitted for further monitoring.

## 2022-11-05 NOTE — Assessment & Plan Note (Signed)
-   Suspected due to UTI for now - Continue monitoring mentation changes with treatment - seems to be back to normal mentation

## 2022-11-05 NOTE — Progress Notes (Addendum)
Progress Note    [name]   [id number]  DOB: 06/07/1955  DOA: 11/04/2022     1 PCP: Ellyn Hack, MD  Initial CC: AMS  Hospital Course: [name] is a 67 yo female with PMH COPD, RA, HTN who presented to the hospital with altered mentation.  Family ordered a well check when the patient became confused on the phone. She was mostly complaining of left lower quadrant abdominal pain and was brought to the ER for further evaluation.  She was unable to provide much collateral information. Has history of cervical myelopathy, status post ACDF 1 year ago and now is on chronic narcotics per [facility] pain clinic.  UA was concerning for UTI and she was started on antibiotics and admitted for further monitoring.  Interval History:  Resting in bed comfortably when seen.  Still some perceived confusion.  Mostly complaining of lower abdominal pain.  Assessment and Plan: * Acute cystitis without hematuria - UA noted with moderate LE, negative nitrite, greater than 50 WBC, many bacteria - Patient poor historian due to altered mentation concerning for UTI symptom - Follow-up urine culture; already growing E. coli.  Follow-up sensitivities - Continue Rocephin  Acute metabolic encephalopathy - Suspected due to UTI for now - Continue monitoring mentation changes with treatment  Abdominal pain - Possible chronic component but for now will see response with UTI treatment  Hypokalemia - Replete as needed  Normocytic anemia - Baseline hemoglobin appears to be around 10 to 11 g/dL  GERD without esophagitis - Continue Protonix  Mixed hyperlipidemia - Continue Lipitor  Essential hypertension - Continue amlodipine   Old records reviewed in assessment of this patient  Antimicrobials: Rocephin 11/04/22 >> current   DVT prophylaxis:  SCDs Start: 11/04/22 1030   Code Status:   Code Status: Full Code  Mobility Assessment (Last 72 Hours)     Mobility Assessment     Row  Name 11/05/22 1300 11/05/22 0810 11/04/22 2018 11/04/22 1216 11/04/22 1130   Does patient have an order for bedrest or is patient medically unstable -- No - Continue assessment No - Continue assessment No - Continue assessment No - Continue assessment   What is the highest level of mobility based on the progressive mobility assessment? Level 3 (Stands with assist) - Balance while standing  and cannot march in place Level 2 (Chairfast) - Balance while sitting on edge of bed and cannot stand Level 2 (Chairfast) - Balance while sitting on edge of bed and cannot stand Level 2 (Chairfast) - Balance while sitting on edge of bed and cannot stand Level 2 (Chairfast) - Balance while sitting on edge of bed and cannot stand   Is the above level different from baseline mobility prior to current illness? -- Yes - Recommend PT order Yes - Recommend PT order Yes - Recommend PT order Yes - Recommend PT order            Barriers to discharge: none Disposition Plan:  Home Status is: Inpt  Objective: Blood pressure 121/77, pulse 71, temperature 98.4 F (36.9 C), temperature source Oral, resp. rate 18, height 5\' 6"  (1.676 m), weight 82.6 kg, SpO2 93%.  Examination:  Physical Exam Constitutional:      General: She is not in acute distress. HENT:     Head: Normocephalic and atraumatic.     Mouth/Throat:     Mouth: Mucous membranes are moist.  Eyes:     Extraocular Movements: Extraocular movements intact.  Cardiovascular:  Rate and Rhythm: Normal rate and regular rhythm.  Pulmonary:     Effort: Pulmonary effort is normal. No respiratory distress.     Breath sounds: Normal breath sounds. No wheezing.  Abdominal:     General: Bowel sounds are normal. There is no distension.     Palpations: Abdomen is soft.     Tenderness: There is abdominal tenderness. Guarding: lower midline.  Musculoskeletal:        General: Normal range of motion.     Cervical back: Normal range of motion and neck supple.   Skin:    General: Skin is warm and dry.  Neurological:     Mental Status: She is alert.     Comments: Follows commands, moves all 4 extremities   Psychiatric:        Mood and Affect: Mood normal.      Consultants:    Procedures:    Data Reviewed: Results for orders placed or performed during the hospital encounter of 11/04/22 (from the past 24 hour(s))  Basic metabolic panel     Status: Abnormal   Collection Time: 11/05/22  7:11 AM  Result Value Ref Range   Sodium 142 135 - 145 mmol/L   Potassium 3.4 (L) 3.5 - 5.1 mmol/L   Chloride 112 (H) 98 - 111 mmol/L   CO2 23 22 - 32 mmol/L   Glucose, Bld 97 70 - 99 mg/dL   BUN 14 8 - 23 mg/dL   Creatinine, Ser 5.40 0.44 - 1.00 mg/dL   Calcium 8.0 (L) 8.9 - 10.3 mg/dL   GFR, Estimated >98 >11 mL/min   Anion gap 7 5 - 15  CBC     Status: Abnormal   Collection Time: 11/05/22  7:11 AM  Result Value Ref Range   WBC 5.0 4.0 - 10.5 K/uL   RBC 3.52 (L) 3.87 - 5.11 MIL/uL   Hemoglobin 9.7 (L) 12.0 - 15.0 g/dL   HCT 91.4 (L) 78.2 - 95.6 %   MCV 88.1 80.0 - 100.0 fL   MCH 27.6 26.0 - 34.0 pg   MCHC 31.3 30.0 - 36.0 g/dL   RDW 21.3 (H) 08.6 - 57.8 %   Platelets 307 150 - 400 K/uL   nRBC 0.0 0.0 - 0.2 %    I have reviewed pertinent nursing notes, vitals, labs, and images as necessary. I have ordered labwork to follow up on as indicated.  I have reviewed the last notes from staff over past 24 hours. I have discussed patient's care plan and test results with nursing staff, CM/SW, and other staff as appropriate.  Time spent: Greater than 50% of the 55 minute visit was spent in counseling/coordination of care for the patient as laid out in the A&P.   LOS: 1 day   Lewie Chamber, MD [facility] 11/05/2022, 4:00 PM

## 2022-11-06 DIAGNOSIS — G9341 Metabolic encephalopathy: Secondary | ICD-10-CM | POA: Diagnosis not present

## 2022-11-06 DIAGNOSIS — R140 Abdominal distension (gaseous): Secondary | ICD-10-CM

## 2022-11-06 DIAGNOSIS — N3000 Acute cystitis without hematuria: Secondary | ICD-10-CM | POA: Diagnosis not present

## 2022-11-06 LAB — CBC WITH DIFFERENTIAL/PLATELET
Abs Immature Granulocytes: 0.02 10*3/uL (ref 0.00–0.07)
Basophils Absolute: 0 10*3/uL (ref 0.0–0.1)
Basophils Relative: 0 %
Eosinophils Absolute: 0.3 10*3/uL (ref 0.0–0.5)
Eosinophils Relative: 5 %
HCT: 32.8 % — ABNORMAL LOW (ref 36.0–46.0)
Hemoglobin: 10.2 g/dL — ABNORMAL LOW (ref 12.0–15.0)
Immature Granulocytes: 0 %
Lymphocytes Relative: 29 %
Lymphs Abs: 1.6 10*3/uL (ref 0.7–4.0)
MCH: 27.1 pg (ref 26.0–34.0)
MCHC: 31.1 g/dL (ref 30.0–36.0)
MCV: 87 fL (ref 80.0–100.0)
Monocytes Absolute: 0.4 10*3/uL (ref 0.1–1.0)
Monocytes Relative: 8 %
Neutro Abs: 3.1 10*3/uL (ref 1.7–7.7)
Neutrophils Relative %: 58 %
Platelets: 299 10*3/uL (ref 150–400)
RBC: 3.77 MIL/uL — ABNORMAL LOW (ref 3.87–5.11)
RDW: 15.5 % (ref 11.5–15.5)
WBC: 5.4 10*3/uL (ref 4.0–10.5)
nRBC: 0 % (ref 0.0–0.2)

## 2022-11-06 LAB — BASIC METABOLIC PANEL
Anion gap: 8 (ref 5–15)
BUN: 10 mg/dL (ref 8–23)
CO2: 27 mmol/L (ref 22–32)
Calcium: 8.3 mg/dL — ABNORMAL LOW (ref 8.9–10.3)
Chloride: 104 mmol/L (ref 98–111)
Creatinine, Ser: 0.77 mg/dL (ref 0.44–1.00)
GFR, Estimated: >60 mL/min (ref >60)
Glucose, Bld: 97 mg/dL (ref 70–99)
Potassium: 3.1 mmol/L — ABNORMAL LOW (ref 3.5–5.1)
Sodium: 139 mmol/L (ref 135–145)

## 2022-11-06 LAB — URINE CULTURE: Culture: >=100000 — AB

## 2022-11-06 LAB — MAGNESIUM: Magnesium: 1.7 mg/dL (ref 1.7–2.4)

## 2022-11-06 MED ORDER — SENNOSIDES-DOCUSATE SODIUM 8.6-50 MG PO TABS
1.0000 | ORAL_TABLET | Freq: Two times a day (BID) | ORAL | Status: DC
  Administered 2022-11-06 – 2022-11-08 (×4): 1 via ORAL
  Filled 2022-11-06 (×4): qty 1

## 2022-11-06 MED ORDER — POTASSIUM CHLORIDE CRYS ER 20 MEQ PO TBCR
40.0000 meq | EXTENDED_RELEASE_TABLET | ORAL | Status: AC
Start: 2022-11-06 — End: 2022-11-06
  Administered 2022-11-06 (×2): 40 meq via ORAL
  Filled 2022-11-06 (×2): qty 2

## 2022-11-06 MED ORDER — LACTULOSE 10 GM/15ML PO SOLN
30.0000 g | Freq: Two times a day (BID) | ORAL | Status: DC
  Administered 2022-11-06 – 2022-11-08 (×4): 30 g via ORAL
  Filled 2022-11-06 (×4): qty 45

## 2022-11-06 MED ORDER — POLYETHYLENE GLYCOL 3350 17 G PO PACK
17.0000 g | PACK | Freq: Every day | ORAL | Status: DC
  Administered 2022-11-06 – 2022-11-07 (×2): 17 g via ORAL
  Filled 2022-11-06 (×3): qty 1

## 2022-11-06 NOTE — Plan of Care (Signed)
  Problem: Education: Goal: Knowledge of General Education information will improve Description: Including pain rating scale, medication(s)/side effects and non-pharmacologic comfort measures Outcome: Progressing   Problem: Clinical Measurements: Goal: Ability to maintain clinical measurements within normal limits will improve Outcome: Progressing Goal: Will remain free from infection Outcome: Progressing Goal: Diagnostic test results will improve Outcome: Progressing Goal: Respiratory complications will improve Outcome: Progressing Goal: Cardiovascular complication will be avoided Outcome: Progressing   Problem: Activity: Goal: Risk for activity intolerance will decrease Outcome: Progressing   Problem: Nutrition: Goal: Adequate nutrition will be maintained Outcome: Progressing   Problem: Coping: Goal: Level of anxiety will decrease Outcome: Progressing   Problem: Elimination: Goal: Will not experience complications related to bowel motility Outcome: Progressing Goal: Will not experience complications related to urinary retention Outcome: Progressing   Problem: Pain Management: Goal: General experience of comfort will improve Outcome: Progressing   Problem: Safety: Goal: Ability to remain free from injury will improve Outcome: Progressing   Problem: Skin Integrity: Goal: Risk for impaired skin integrity will decrease Outcome: Progressing

## 2022-11-06 NOTE — Progress Notes (Signed)
Progress Note    Boyd Muraco   [id number]  DOB: July 29, 1955  DOA: 11/04/2022     2 PCP: Ellyn Hack, MD  Initial CC: AMS  Hospital Course: Ms. Mclin is a 67 yo female with PMH COPD, RA, HTN who presented to the hospital with altered mentation.  Family ordered a well check when the patient became confused on the phone. She was mostly complaining of left lower quadrant abdominal pain and was brought to the ER for further evaluation.  She was unable to provide much collateral information. Has history of cervical myelopathy, status post ACDF 1 year ago and now is on chronic narcotics per [facility] pain clinic.  UA was concerning for UTI and she was started on antibiotics and admitted for further monitoring.  Interval History:  Still complaining of "lower abdominal bloating" and pointing at suprapubic area. Denies pain; mostly describes it as feeling "full". Last BM was almost a week ago yet she didn't seem phased by this. Discussed we'll try laxatives today and see response.   Assessment and Plan: * Acute cystitis without hematuria - UA noted with moderate LE, negative nitrite, greater than 50 WBC, many bacteria - Patient poor historian due to altered mentation concerning for UTI symptom - Follow-up urine culture; growing E. coli.  Sensitivities reviewed - Continue Rocephin; will finish 3 days IV and stop  Acute metabolic encephalopathy-resolved as of 11/06/2022 - Suspected due to UTI for now - Continue monitoring mentation changes with treatment - seems to be back to normal mentation  Abdominal bloating - points to lower mid abdomen essentially suprapubic area - Possible chronic component but for now will see response with UTI treatment - CT further reviewed; does have stool burden and she claims last BM ~1 week ago but doesn't seem bothered by this; also has hiatal hernia noted on CT but she is pointing to much lower - trial of laxatives to see if helps with "swelling"  description  Hypokalemia - Replete as needed  Normocytic anemia - Baseline hemoglobin appears to be around 10 to 11 g/dL  GERD without esophagitis - Continue Protonix  Mixed hyperlipidemia - Continue Lipitor  Essential hypertension - Continue amlodipine   Old records reviewed in assessment of this patient  Antimicrobials: Rocephin 11/04/22 >> 10/26 Keflex 10/26 >> current    DVT prophylaxis:  SCDs Start: 11/04/22 1030   Code Status:   Code Status: Full Code  Mobility Assessment (Last 72 Hours)     Mobility Assessment     Row Name 11/06/22 0815 11/05/22 2022 11/05/22 1300 11/05/22 0810 11/04/22 2018   Does patient have an order for bedrest or is patient medically unstable No - Continue assessment No - Continue assessment -- No - Continue assessment No - Continue assessment   What is the highest level of mobility based on the progressive mobility assessment? Level 3 (Stands with assist) - Balance while standing  and cannot march in place Level 3 (Stands with assist) - Balance while standing  and cannot march in place Level 3 (Stands with assist) - Balance while standing  and cannot march in place Level 2 (Chairfast) - Balance while sitting on edge of bed and cannot stand Level 2 (Chairfast) - Balance while sitting on edge of bed and cannot stand   Is the above level different from baseline mobility prior to current illness? Yes - Recommend PT order Yes - Recommend PT order -- Yes - Recommend PT order Yes - Recommend PT order  Row Name 11/04/22 1216 11/04/22 1130         Does patient have an order for bedrest or is patient medically unstable No - Continue assessment No - Continue assessment      What is the highest level of mobility based on the progressive mobility assessment? Level 2 (Chairfast) - Balance while sitting on edge of bed and cannot stand Level 2 (Chairfast) - Balance while sitting on edge of bed and cannot stand      Is the above level different from  baseline mobility prior to current illness? Yes - Recommend PT order Yes - Recommend PT order               Barriers to discharge: none Disposition Plan:  Home Status is: Inpt  Objective: Blood pressure 126/84, pulse 79, temperature 98.4 F (36.9 C), temperature source Oral, resp. rate 15, height 5\' 6"  (1.676 m), weight 82.6 kg, SpO2 95%.  Examination:  Physical Exam Constitutional:      General: She is not in acute distress. HENT:     Head: Normocephalic and atraumatic.     Mouth/Throat:     Mouth: Mucous membranes are moist.  Eyes:     Extraocular Movements: Extraocular movements intact.  Cardiovascular:     Rate and Rhythm: Normal rate and regular rhythm.  Pulmonary:     Effort: Pulmonary effort is normal. No respiratory distress.     Breath sounds: Normal breath sounds. No wheezing.  Abdominal:     General: Bowel sounds are normal. There is no distension.     Palpations: Abdomen is soft.     Tenderness: There is abdominal tenderness in the suprapubic area.  Musculoskeletal:        General: Normal range of motion.     Cervical back: Normal range of motion and neck supple.  Skin:    General: Skin is warm and dry.  Neurological:     Mental Status: She is alert.     Comments: Follows commands, moves all 4 extremities   Psychiatric:        Mood and Affect: Mood normal.      Consultants:    Procedures:    Data Reviewed: Results for orders placed or performed during the hospital encounter of 11/04/22 (from the past 24 hour(s))  Basic metabolic panel     Status: Abnormal   Collection Time: 11/06/22  7:05 AM  Result Value Ref Range   Sodium 139 135 - 145 mmol/L   Potassium 3.1 (L) 3.5 - 5.1 mmol/L   Chloride 104 98 - 111 mmol/L   CO2 27 22 - 32 mmol/L   Glucose, Bld 97 70 - 99 mg/dL   BUN 10 8 - 23 mg/dL   Creatinine, Ser 4.13 0.44 - 1.00 mg/dL   Calcium 8.3 (L) 8.9 - 10.3 mg/dL   GFR, Estimated >24 >40 mL/min   Anion gap 8 5 - 15  CBC with  Differential/Platelet     Status: Abnormal   Collection Time: 11/06/22  7:05 AM  Result Value Ref Range   WBC 5.4 4.0 - 10.5 K/uL   RBC 3.77 (L) 3.87 - 5.11 MIL/uL   Hemoglobin 10.2 (L) 12.0 - 15.0 g/dL   HCT 10.2 (L) 72.5 - 36.6 %   MCV 87.0 80.0 - 100.0 fL   MCH 27.1 26.0 - 34.0 pg   MCHC 31.1 30.0 - 36.0 g/dL   RDW 44.0 34.7 - 42.5 %   Platelets 299 150 - 400 K/uL  nRBC 0.0 0.0 - 0.2 %   Neutrophils Relative % 58 %   Neutro Abs 3.1 1.7 - 7.7 K/uL   Lymphocytes Relative 29 %   Lymphs Abs 1.6 0.7 - 4.0 K/uL   Monocytes Relative 8 %   Monocytes Absolute 0.4 0.1 - 1.0 K/uL   Eosinophils Relative 5 %   Eosinophils Absolute 0.3 0.0 - 0.5 K/uL   Basophils Relative 0 %   Basophils Absolute 0.0 0.0 - 0.1 K/uL   Immature Granulocytes 0 %   Abs Immature Granulocytes 0.02 0.00 - 0.07 K/uL  Magnesium     Status: None   Collection Time: 11/06/22  7:05 AM  Result Value Ref Range   Magnesium 1.7 1.7 - 2.4 mg/dL    I have reviewed pertinent nursing notes, vitals, labs, and images as necessary. I have ordered labwork to follow up on as indicated.  I have reviewed the last notes from staff over past 24 hours. I have discussed patient's care plan and test results with nursing staff, CM/SW, and other staff as appropriate.  Time spent: Greater than 50% of the 55 minute visit was spent in counseling/coordination of care for the patient as laid out in the A&P.   LOS: 2 days   Lewie Chamber, MD [facility] 11/06/2022, 4:24 PM

## 2022-11-07 DIAGNOSIS — R140 Abdominal distension (gaseous): Secondary | ICD-10-CM | POA: Diagnosis not present

## 2022-11-07 DIAGNOSIS — N3000 Acute cystitis without hematuria: Secondary | ICD-10-CM | POA: Diagnosis not present

## 2022-11-07 DIAGNOSIS — G9341 Metabolic encephalopathy: Secondary | ICD-10-CM | POA: Diagnosis not present

## 2022-11-07 LAB — BASIC METABOLIC PANEL
Anion gap: 6 (ref 5–15)
BUN: 14 mg/dL (ref 8–23)
CO2: 26 mmol/L (ref 22–32)
Calcium: 8.6 mg/dL — ABNORMAL LOW (ref 8.9–10.3)
Chloride: 104 mmol/L (ref 98–111)
Creatinine, Ser: 0.92 mg/dL (ref 0.44–1.00)
GFR, Estimated: >60 mL/min (ref >60)
Glucose, Bld: 99 mg/dL (ref 70–99)
Potassium: 4.2 mmol/L (ref 3.5–5.1)
Sodium: 136 mmol/L (ref 135–145)

## 2022-11-07 LAB — CBC WITH DIFFERENTIAL/PLATELET
Abs Immature Granulocytes: 0.01 10*3/uL (ref 0.00–0.07)
Basophils Absolute: 0 10*3/uL (ref 0.0–0.1)
Basophils Relative: 1 %
Eosinophils Absolute: 0.3 10*3/uL (ref 0.0–0.5)
Eosinophils Relative: 5 %
HCT: 36.1 % (ref 36.0–46.0)
Hemoglobin: 11.1 g/dL — ABNORMAL LOW (ref 12.0–15.0)
Immature Granulocytes: 0 %
Lymphocytes Relative: 29 %
Lymphs Abs: 1.6 10*3/uL (ref 0.7–4.0)
MCH: 26.7 pg (ref 26.0–34.0)
MCHC: 30.7 g/dL (ref 30.0–36.0)
MCV: 86.8 fL (ref 80.0–100.0)
Monocytes Absolute: 0.4 10*3/uL (ref 0.1–1.0)
Monocytes Relative: 8 %
Neutro Abs: 3.2 10*3/uL (ref 1.7–7.7)
Neutrophils Relative %: 57 %
Platelets: 323 10*3/uL (ref 150–400)
RBC: 4.16 MIL/uL (ref 3.87–5.11)
RDW: 15.5 % (ref 11.5–15.5)
WBC: 5.5 10*3/uL (ref 4.0–10.5)
nRBC: 0 % (ref 0.0–0.2)

## 2022-11-07 LAB — MAGNESIUM: Magnesium: 1.9 mg/dL (ref 1.7–2.4)

## 2022-11-07 MED ORDER — LINACLOTIDE 145 MCG PO CAPS
145.0000 ug | ORAL_CAPSULE | ORAL | Status: DC
  Administered 2022-11-07: 145 ug via ORAL
  Filled 2022-11-07: qty 1

## 2022-11-07 MED ORDER — MAGNESIUM CITRATE PO SOLN
1.0000 | Freq: Once | ORAL | Status: AC
  Administered 2022-11-07: 1 via ORAL
  Filled 2022-11-07: qty 296

## 2022-11-07 MED ORDER — OXYCODONE HCL 5 MG PO TABS
5.0000 mg | ORAL_TABLET | Freq: Once | ORAL | Status: AC | PRN
  Administered 2022-11-07: 5 mg via ORAL
  Filled 2022-11-07: qty 1

## 2022-11-07 MED ORDER — DULOXETINE HCL 60 MG PO CPEP
60.0000 mg | ORAL_CAPSULE | Freq: Every day | ORAL | Status: DC
  Administered 2022-11-07 – 2022-11-08 (×2): 60 mg via ORAL
  Filled 2022-11-07 (×2): qty 1

## 2022-11-07 MED ORDER — GABAPENTIN 400 MG PO CAPS
800.0000 mg | ORAL_CAPSULE | Freq: Three times a day (TID) | ORAL | Status: DC
  Administered 2022-11-07 – 2022-11-08 (×4): 800 mg via ORAL
  Filled 2022-11-07 (×4): qty 2

## 2022-11-07 NOTE — Plan of Care (Signed)
  Problem: Education: Goal: Knowledge of General Education information will improve Description: Including pain rating scale, medication(s)/side effects and non-pharmacologic comfort measures Outcome: Progressing   Problem: Health Behavior/Discharge Planning: Goal: Ability to manage health-related needs will improve Outcome: Progressing   Problem: Clinical Measurements: Goal: Will remain free from infection Outcome: Progressing Goal: Cardiovascular complication will be avoided Outcome: Progressing   Problem: Activity: Goal: Risk for activity intolerance will decrease Outcome: Progressing   Problem: Nutrition: Goal: Adequate nutrition will be maintained Outcome: Progressing   Problem: Elimination: Goal: Will not experience complications related to bowel motility Outcome: Progressing Goal: Will not experience complications related to urinary retention Outcome: Progressing   Problem: Pain Management: Goal: General experience of comfort will improve Outcome: Progressing   Problem: Safety: Goal: Ability to remain free from injury will improve Outcome: Progressing   Problem: Skin Integrity: Goal: Risk for impaired skin integrity will decrease Outcome: Progressing

## 2022-11-07 NOTE — Progress Notes (Signed)
Progress Note    [name]   [id number]  DOB: 1955-09-17  DOA: 11/04/2022     3 PCP: Ellyn Hack, MD  Initial CC: AMS  Hospital Course: [name] is a 67 yo female with PMH COPD, RA, HTN who presented to the hospital with altered mentation.  Family ordered a well check when the patient became confused on the phone. She was mostly complaining of left lower quadrant abdominal pain and was brought to the ER for further evaluation.  She was unable to provide much collateral information. Has history of cervical myelopathy, status post ACDF 1 year ago and now is on chronic narcotics per [facility] pain clinic.  UA was concerning for UTI and she was started on antibiotics and admitted for further monitoring.  Interval History:  No bowel movement with laxatives trialed yesterday.  Unfortunately still complaining of lower abdominal bloating sensation and appears uncomfortable.  We will escalate laxatives further today.  No other acute etiology at this time.  Assessment and Plan: * Acute cystitis without hematuria - UA noted with moderate LE, negative nitrite, greater than 50 WBC, many bacteria - Patient poor historian due to altered mentation concerning for UTI symptom - Follow-up urine culture; growing E. coli.  Sensitivities reviewed - Continue Rocephin; will finish 3 days IV and stop  Acute metabolic encephalopathy-resolved as of 11/06/2022 - Suspected due to UTI for now - Continue monitoring mentation changes with treatment - seems to be back to normal mentation  Abdominal bloating - points to lower mid abdomen essentially suprapubic area - Possible chronic component but for now will see response with UTI treatment - CT further reviewed; does have stool burden and she claims last BM ~1 week ago but doesn't seem bothered by this; also has hiatal hernia noted on CT but she is pointing to much lower - trial of laxatives to see if helps with "swelling"  description  Hypokalemia - Replete as needed  Normocytic anemia - Baseline hemoglobin appears to be around 10 to 11 g/dL  GERD without esophagitis - Continue Protonix  Mixed hyperlipidemia - Continue Lipitor  Essential hypertension - Continue amlodipine   Old records reviewed in assessment of this patient  Antimicrobials: Rocephin 11/04/22 >> 10/26 Keflex 10/26 >> current    DVT prophylaxis:  SCDs Start: 11/04/22 1030   Code Status:   Code Status: Full Code  Mobility Assessment (Last 72 Hours)     Mobility Assessment     Row Name 11/07/22 0815 11/06/22 2001 11/06/22 0815 11/05/22 2022 11/05/22 1300   Does patient have an order for bedrest or is patient medically unstable No - Continue assessment No - Continue assessment No - Continue assessment No - Continue assessment --   What is the highest level of mobility based on the progressive mobility assessment? Level 3 (Stands with assist) - Balance while standing  and cannot march in place Level 3 (Stands with assist) - Balance while standing  and cannot march in place Level 3 (Stands with assist) - Balance while standing  and cannot march in place Level 3 (Stands with assist) - Balance while standing  and cannot march in place Level 3 (Stands with assist) - Balance while standing  and cannot march in place   Is the above level different from baseline mobility prior to current illness? Yes - Recommend PT order Yes - Recommend PT order Yes - Recommend PT order Yes - Recommend PT order --    Row Name 11/05/22 0810 11/04/22  2018         Does patient have an order for bedrest or is patient medically unstable No - Continue assessment No - Continue assessment      What is the highest level of mobility based on the progressive mobility assessment? Level 2 (Chairfast) - Balance while sitting on edge of bed and cannot stand Level 2 (Chairfast) - Balance while sitting on edge of bed and cannot stand      Is the above level different  from baseline mobility prior to current illness? Yes - Recommend PT order Yes - Recommend PT order               Barriers to discharge: none Disposition Plan:  Home Status is: Inpt  Objective: Blood pressure 111/70, pulse 66, temperature 98.7 F (37.1 C), temperature source Oral, resp. rate 18, height 5\' 6"  (1.676 m), weight 82.6 kg, SpO2 94%.  Examination:  Physical Exam Constitutional:      General: She is not in acute distress. HENT:     Head: Normocephalic and atraumatic.     Mouth/Throat:     Mouth: Mucous membranes are moist.  Eyes:     Extraocular Movements: Extraocular movements intact.  Cardiovascular:     Rate and Rhythm: Normal rate and regular rhythm.  Pulmonary:     Effort: Pulmonary effort is normal. No respiratory distress.     Breath sounds: Normal breath sounds. No wheezing.  Abdominal:     General: Bowel sounds are normal. There is no distension.     Palpations: Abdomen is soft.     Tenderness: There is abdominal tenderness in the suprapubic area.  Musculoskeletal:        General: Normal range of motion.     Cervical back: Normal range of motion and neck supple.  Skin:    General: Skin is warm and dry.  Neurological:     Mental Status: She is alert.     Comments: Follows commands, moves all 4 extremities   Psychiatric:        Mood and Affect: Mood normal.      Consultants:    Procedures:    Data Reviewed: Results for orders placed or performed during the hospital encounter of 11/04/22 (from the past 24 hour(s))  Basic metabolic panel     Status: Abnormal   Collection Time: 11/07/22  6:49 AM  Result Value Ref Range   Sodium 136 135 - 145 mmol/L   Potassium 4.2 3.5 - 5.1 mmol/L   Chloride 104 98 - 111 mmol/L   CO2 26 22 - 32 mmol/L   Glucose, Bld 99 70 - 99 mg/dL   BUN 14 8 - 23 mg/dL   Creatinine, Ser 1.61 0.44 - 1.00 mg/dL   Calcium 8.6 (L) 8.9 - 10.3 mg/dL   GFR, Estimated >09 >60 mL/min   Anion gap 6 5 - 15  CBC with  Differential/Platelet     Status: Abnormal   Collection Time: 11/07/22  6:49 AM  Result Value Ref Range   WBC 5.5 4.0 - 10.5 K/uL   RBC 4.16 3.87 - 5.11 MIL/uL   Hemoglobin 11.1 (L) 12.0 - 15.0 g/dL   HCT 45.4 09.8 - 11.9 %   MCV 86.8 80.0 - 100.0 fL   MCH 26.7 26.0 - 34.0 pg   MCHC 30.7 30.0 - 36.0 g/dL   RDW 14.7 82.9 - 56.2 %   Platelets 323 150 - 400 K/uL   nRBC 0.0 0.0 - 0.2 %  Neutrophils Relative % 57 %   Neutro Abs 3.2 1.7 - 7.7 K/uL   Lymphocytes Relative 29 %   Lymphs Abs 1.6 0.7 - 4.0 K/uL   Monocytes Relative 8 %   Monocytes Absolute 0.4 0.1 - 1.0 K/uL   Eosinophils Relative 5 %   Eosinophils Absolute 0.3 0.0 - 0.5 K/uL   Basophils Relative 1 %   Basophils Absolute 0.0 0.0 - 0.1 K/uL   Immature Granulocytes 0 %   Abs Immature Granulocytes 0.01 0.00 - 0.07 K/uL  Magnesium     Status: None   Collection Time: 11/07/22  6:49 AM  Result Value Ref Range   Magnesium 1.9 1.7 - 2.4 mg/dL    I have reviewed pertinent nursing notes, vitals, labs, and images as necessary. I have ordered labwork to follow up on as indicated.  I have reviewed the last notes from staff over past 24 hours. I have discussed patient's care plan and test results with nursing staff, CM/SW, and other staff as appropriate.    LOS: 3 days   Lewie Chamber, MD [facility] 11/07/2022, 5:32 PM

## 2022-11-07 NOTE — Plan of Care (Signed)

## 2022-11-08 DIAGNOSIS — R0902 Hypoxemia: Secondary | ICD-10-CM

## 2022-11-08 DIAGNOSIS — N3000 Acute cystitis without hematuria: Secondary | ICD-10-CM | POA: Diagnosis not present

## 2022-11-08 DIAGNOSIS — G9341 Metabolic encephalopathy: Secondary | ICD-10-CM | POA: Diagnosis not present

## 2022-11-08 NOTE — Progress Notes (Signed)
No change from am assessment. The patient is alert, oriented x4, ambulatory with walker. Oxygen education has been reviewed and equipment delivered. Discharge instructions were reviewed. Questions concerns were denied at this time. The patient is awaiting transportation.

## 2022-11-08 NOTE — Progress Notes (Signed)
SATURATION QUALIFICATIONS: (This note is used to comply with regulatory documentation for home oxygen)  Patient Saturations on Room Air at Rest = 89%  Patient Saturations on Room Air while Ambulating = 87%  Patient Saturations on 2 Liters of oxygen while Ambulating = 96%  Please briefly explain why patient needs home oxygen: The patient ambulated into the restroom and back to the bed. During activity she became SOB with increased RR 28-32. Oxygen 2 liters [place] applied, oxygen saturation increased 96-97%. RR returned 18-20 after getting back into the bed. She has been encouraged to use the incentive spirometer.
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
## 2022-11-08 NOTE — Plan of Care (Signed)

## 2022-11-08 NOTE — Progress Notes (Signed)
Physical Therapy Treatment Patient Details Name: [name] MRN: [id number] DOB: 12/02/1955 Today's Date: 11/08/2022   History of Present Illness 67 yo female presents to therapy following hospital admission on 11/04/2022 due to encephalopathy attributed to UTI. Pt PMH includes but is not limited to: CKD III, COPD, RA, HTN, GERD, cervical myelopathy s/p ACDF and tobacco abuse.    PT Comments  Pt progressing toward goals, reports she is going home today; activity tol improving, continues to require cues and assist for safety. Amb O2 stas done by RN this am. Pt will benefit from HHPT at d/c   If plan is discharge home, recommend the following: A little help with walking and/or transfers;A little help with bathing/dressing/bathroom;Assistance with cooking/housework;Assist for transportation;Help with stairs or ramp for entrance   Can travel by private vehicle        Equipment Recommendations  None recommended by PT (has rollator)    Recommendations for Other Services       Precautions / Restrictions Precautions Precautions: Fall Restrictions Weight Bearing Restrictions: No     Mobility  Bed Mobility Overal bed mobility: Needs Assistance Bed Mobility: Supine to Sit     Supine to sit: Supervision, HOB elevated     General bed mobility comments: for safety    Transfers Overall transfer level: Needs assistance Equipment used: Rolling walker (2 wheels) Transfers: Sit to/from Stand Sit to Stand: Contact guard assist           General transfer comment: cues for safety and proper hand placement; STS x3 from varied ht surfaces    Ambulation/Gait Ambulation/Gait assistance: Min assist Gait Distance (Feet): 90 Feet Assistive device: Rolling walker (2 wheels) Gait Pattern/deviations: Step-to pattern, Knee flexed in stance - right, Knee flexed in stance - left, Decreased dorsiflexion - right, Decreased dorsiflexion - left       General Gait Details: cues for proper  distance from RW, to keep feet inside RW; one brief standing rest however pt denies DOE, only UE/LE fatigue; on 2L throughout   Stairs             Wheelchair Mobility     Tilt Bed    Modified Rankin (Stroke Patients Only)       Balance   Sitting-balance support: Feet supported Sitting balance-Leahy Scale: Good     Standing balance support: No upper extremity supported, Reliant on assistive device for balance, During functional activity Standing balance-Leahy Scale: Fair                              Cognition Arousal: Alert Behavior During Therapy: Impulsive   Area of Impairment: Attention, Safety/judgement                   Current Attention Level: Sustained     Safety/Judgement: Decreased awareness of safety              Exercises      General Comments        Pertinent Vitals/Pain Pain Assessment Pain Assessment: No/denies pain    Home Living                          Prior Function            PT Goals (current goals can now be found in the care plan section) Acute Rehab PT Goals Patient Stated Goal: to be able to walk again and get rid  of the ball in my stomach PT Goal Formulation: With patient Time For Goal Achievement: 11/19/22 Potential to Achieve Goals: Good Progress towards PT goals: Progressing toward goals    Frequency    Min 1X/week      PT Plan      Co-evaluation              AM-PAC PT "6 Clicks" Mobility   Outcome Measure  Help needed turning from your back to your side while in a flat bed without using bedrails?: None Help needed moving from lying on your back to sitting on the side of a flat bed without using bedrails?: A Little Help needed moving to and from a bed to a chair (including a wheelchair)?: A Little Help needed standing up from a chair using your arms (e.g., wheelchair or bedside chair)?: A Little Help needed to walk in hospital room?: A Little Help needed climbing  3-5 steps with a railing? : A Lot 6 Click Score: 18    End of Session Equipment Utilized During Treatment: Gait belt;Oxygen Activity Tolerance: Patient tolerated treatment well Patient left: in chair;with call bell/phone within reach   PT Visit Diagnosis: Unsteadiness on feet (R26.81);Other abnormalities of gait and mobility (R26.89);Repeated falls (R29.6)     Time: 1000-1021 PT Time Calculation (min) (ACUTE ONLY): 21 min  Charges:    $Gait Training: 8-22 mins PT General Charges $$ ACUTE PT VISIT: 1 Visit                     [name], PT  Acute Rehab Dept [facility]) [phone]  11/08/2022    [facility] 11/08/2022, 10:27 AM

## 2022-11-08 NOTE — TOC Transition Note (Signed)
Transition of Care [facility]) - CM/SW Discharge Note   Patient Details  Name: [name] MRN: [id number] Date of Birth: 07/10/1955  Transition of Care [facility]) CM/SW Contact:  Beckie Busing, RN Phone Number:[phone]  11/08/2022, 11:12 AM   Clinical Narrative:    Cm at bedside to discuss home health recommendations. Patient states she ia already active with [facility] outpatient rehab and would like to continue with that versus home health. Portable tank for O2 has been delivered to the bedside.          Patient Goals and CMS Choice      Discharge Placement                         Discharge Plan and Services Additional resources added to the After Visit Summary for                                       Social Determinants of Health (SDOH) Interventions SDOH Screenings   Food Insecurity: No Food Insecurity (11/04/2022)  Housing: Medium Risk (11/04/2022)  Transportation Needs: Unmet Transportation Needs (11/04/2022)  Utilities: Patient Unable To Answer (11/04/2022)  Financial Resource Strain: High Risk (06/24/2018)  Social Connections: Unknown (03/02/2022)   Received from [facility]  Tobacco Use: High Risk (11/04/2022)     Readmission Risk Interventions    04/05/2021    5:04 PM 11/13/2020   10:26 AM 11/10/2020   11:54 AM  Readmission Risk Prevention Plan  Transportation Screening Complete Complete Complete  PCP or Specialist Appt within 5-7 Days   Complete  PCP or Specialist Appt within 3-5 Days  Complete   Home Care Screening   Complete  Medication Review (RN CM)   Complete  HRI or Home Care Consult  Complete   Social Work Consult for Recovery Care Planning/Counseling  Complete   Palliative Care Screening  Complete   Medication Review [employer]) Complete Complete   PCP or Specialist appointment within 3-5 days of discharge Complete    HRI or Home Care Consult Complete    SW Recovery Care/Counseling Consult Complete     Palliative Care Screening Complete    Skilled Nursing Facility Complete

## 2022-11-08 NOTE — TOC Progression Note (Signed)
Transition of Care [facility]) - Progression Note    Patient Details  Name: [name] MRN: [id number] Date of Birth: [birth year]  Transition of Care [facility]) CM/SW Contact  Beckie Busing, RN Phone Number:[phone] 11/08/2022, 10:36 AM  Clinical Narrative:    Patient has O2 orders. CM sent referral to [facility]. Per Vonna Kotyk O2 to be delivered to bedside.         Expected Discharge Plan and Services         Expected Discharge Date: 11/08/22                                     Social Determinants of Health (SDOH) Interventions SDOH Screenings   Food Insecurity: No Food Insecurity (11/04/2022)  Housing: Medium Risk (11/04/2022)  Transportation Needs: Unmet Transportation Needs (11/04/2022)  Utilities: Patient Unable To Answer (11/04/2022)  Financial Resource Strain: High Risk (06/24/2018)  Social Connections: Unknown (03/02/2022)   Received from [facility], [facility]  Tobacco Use: High Risk (11/04/2022)    Readmission Risk Interventions    04/05/2021    5:04 PM 11/13/2020   10:26 AM 11/10/2020   11:54 AM  Readmission Risk Prevention Plan  Transportation Screening Complete Complete Complete  PCP or Specialist Appt within 5-7 Days   Complete  PCP or Specialist Appt within 3-5 Days  Complete   Home Care Screening   Complete  Medication Review (RN CM)   Complete  HRI or Home Care Consult  Complete   Social Work Consult for Recovery Care Planning/Counseling  Complete   Palliative Care Screening  Complete   Medication Review Oceanographer) Complete Complete   PCP or Specialist appointment within 3-5 days of discharge Complete    HRI or Home Care Consult Complete    SW Recovery Care/Counseling Consult Complete    Palliative Care Screening Complete    Skilled Nursing Facility Complete

## 2022-11-08 NOTE — Assessment & Plan Note (Signed)
-   possibly atelectasis vs hypoventilation from pain/abdominal bloating. Also at risk with chronic opioid use - arranged for home O2 at discharge

## 2022-11-10 ENCOUNTER — Ambulatory Visit: Payer: 59
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
## 2022-11-11 ENCOUNTER — Other Ambulatory Visit: Payer: Self-pay

## 2022-11-11 ENCOUNTER — Emergency Department (HOSPITAL_COMMUNITY)
Admission: EM | Admit: 2022-11-11 | Discharge: 2022-11-11 | Disposition: A | Discharge status: Home / Self Care | Payer: 59 | Attending: Emergency Medicine | Admitting: Emergency Medicine

## 2022-11-11 ENCOUNTER — Emergency Department (HOSPITAL_COMMUNITY): Payer: 59

## 2022-11-11 DIAGNOSIS — R079 Chest pain, unspecified: Secondary | ICD-10-CM | POA: Diagnosis present

## 2022-11-11 DIAGNOSIS — Z79899 Other long term (current) drug therapy: Secondary | ICD-10-CM | POA: Insufficient documentation

## 2022-11-11 DIAGNOSIS — D72829 Elevated white blood cell count, unspecified: Secondary | ICD-10-CM | POA: Insufficient documentation

## 2022-11-11 DIAGNOSIS — I10 Essential (primary) hypertension: Secondary | ICD-10-CM | POA: Insufficient documentation

## 2022-11-11 DIAGNOSIS — J449 Chronic obstructive pulmonary disease, unspecified: Secondary | ICD-10-CM | POA: Diagnosis not present

## 2022-11-11 DIAGNOSIS — R0789 Other chest pain: Secondary | ICD-10-CM | POA: Insufficient documentation

## 2022-11-11 LAB — COMPREHENSIVE METABOLIC PANEL
ALT: 14 U/L (ref 0–44)
AST: 16 U/L (ref 15–41)
Albumin: 3.8 g/dL (ref 3.5–5.0)
Alkaline Phosphatase: 119 U/L (ref 38–126)
Anion gap: 10 (ref 5–15)
BUN: 23 mg/dL (ref 8–23)
CO2: 23 mmol/L (ref 22–32)
Calcium: 9.1 mg/dL (ref 8.9–10.3)
Chloride: 106 mmol/L (ref 98–111)
Creatinine, Ser: 0.9 mg/dL (ref 0.44–1.00)
GFR, Estimated: >60 mL/min (ref >60)
Glucose, Bld: 97 mg/dL (ref 70–99)
Potassium: 3.9 mmol/L (ref 3.5–5.1)
Sodium: 139 mmol/L (ref 135–145)
Total Bilirubin: 0.7 mg/dL (ref 0.3–1.2)
Total Protein: 8.3 g/dL — ABNORMAL HIGH (ref 6.5–8.1)

## 2022-11-11 LAB — TROPONIN I (HIGH SENSITIVITY)
Troponin I (High Sensitivity): 11 ng/L (ref <18)
Troponin I (High Sensitivity): 10 ng/L (ref <18)

## 2022-11-11 LAB — CBC
HCT: 39 % (ref 36.0–46.0)
Hemoglobin: 12 g/dL (ref 12.0–15.0)
MCH: 27 pg (ref 26.0–34.0)
MCHC: 30.8 g/dL (ref 30.0–36.0)
MCV: 87.8 fL (ref 80.0–100.0)
Platelets: 340 10*3/uL (ref 150–400)
RBC: 4.44 MIL/uL (ref 3.87–5.11)
RDW: 15.7 % — ABNORMAL HIGH (ref 11.5–15.5)
WBC: 12.2 10*3/uL — ABNORMAL HIGH (ref 4.0–10.5)
nRBC: 0 % (ref 0.0–0.2)

## 2022-11-11 LAB — LIPASE, BLOOD: Lipase: 35 U/L (ref 11–51)

## 2022-11-11 LAB — D-DIMER, QUANTITATIVE: D-Dimer, Quant: 0.4 ug{FEU}/mL (ref 0.00–0.50)

## 2022-11-11 MED ORDER — ASPIRIN 81 MG PO CHEW
324.0000 mg | CHEWABLE_TABLET | Freq: Once | ORAL | Status: DC
  Filled 2022-11-11: qty 4

## 2022-11-11 MED ORDER — KETOROLAC TROMETHAMINE 30 MG/ML IJ SOLN
15.0000 mg | Freq: Once | INTRAMUSCULAR | Status: AC
  Administered 2022-11-11: 15 mg via INTRAVENOUS
  Filled 2022-11-11: qty 1

## 2022-11-11 NOTE — ED Triage Notes (Signed)
BIB EMS Chest pain on left chest radiates to left neck and jaw, left arm and back. Pt has COPD and productive cough. 2L 02 at home tooo, 324 mg ASA, 12 Ld, unable to get an IV.   Pt elected to come to [facility] rather than [facility]. Signed AMA to go to [facility] with EMS.

## 2022-11-11 NOTE — Discharge Instructions (Signed)
The test today in the ED were reassuring.  Please continue your home medications.  Follow-up with your doctor to be rechecked.

## 2022-11-11 NOTE — ED Provider Notes (Signed)
[place] EMERGENCY DEPARTMENT AT [facility] Provider Note   CSN: [id number] Arrival date & time: 11/11/22  0350     History  No chief complaint on file.   [name] is a 67 y.o. female.  HPI   Patient has a history of rheumatoid arthritis, hypertension, COPD, pneumonia, cocaine use, chronic kidney disease who presents ED with complaints of chest pain.  Patient complains of a cramping type pain in her chest.  Its on the left side.  This started this morning when she woke up.  Increases when she tries to turn to the side when she moves around.  She denies any fevers or chills or coughing.  No shortness of breath.  Patient denies any history of heart attack or blood clots.  Patient was recently admitted to the hospital on October 24.  At that time patient was admitted to the hospital after complaining of abdominal pain associated with altered mental status.  Patient was found to have a UTI.  Patient also noted to be on chronic opiates possibly contributing to her altered mental status  Home Medications Prior to Admission medications   Medication Sig Start Date End Date Taking? Authorizing Provider  albuterol (VENTOLIN HFA) 108 (90 Base) MCG/ACT inhaler Inhale 1-2 puffs into the lungs every 6 (six) hours as needed for wheezing or shortness of breath. 06/05/22   Jeanelle Malling, PA  amitriptyline (ELAVIL) 50 MG tablet Take 50 mg by mouth at bedtime. 10/23/22   [provider]  amLODipine (NORVASC) 10 MG tablet Take 10 mg by mouth daily. 10/23/22   [provider]  Ernestina Patches 62.5-25 MCG/ACT AEPB Inhale 1 puff into the lungs daily. 09/17/21   [provider]  atorvastatin (LIPITOR) 40 MG tablet Take 1 tablet (40 mg total) by mouth daily at 6 PM. 08/22/14   Rai, Ripudeep K, MD  baclofen (LIORESAL) 10 MG tablet Take 10 mg by mouth 2 (two) times daily.    [provider]  carvedilol (COREG) 6.25 MG tablet Take 6.25 mg by mouth 2 (two) times daily.  09/27/22   [provider]  diclofenac Sodium (VOLTAREN) 1 % GEL Apply 2 g topically 4 (four) times daily as needed (joint pain). 07/18/21   Steffanie Rainwater, MD  DULoxetine (CYMBALTA) 60 MG capsule Take 60 mg by mouth daily. 04/07/22   [provider]  gabapentin (NEURONTIN) 800 MG tablet Take 800 mg by mouth 3 (three) times daily.    [provider]  ibuprofen (ADVIL) 800 MG tablet Take 800 mg by mouth every 8 (eight) hours as needed for moderate pain (pain score 4-6). 10/23/22   [provider]  linaclotide (LINZESS) 145 MCG CAPS capsule Take 145 mcg by mouth every other day.    [provider]  naloxone [facility]) nasal spray 4 mg/0.1 mL Place 1 spray into the nose once. As needed for overdose    [provider]  oxyCODONE-acetaminophen (PERCOCET) 10-325 MG tablet Take 1 tablet by mouth every 4 (four) hours. 10/25/22 11/23/22  [provider]  pantoprazole (PROTONIX) 40 MG tablet Take 40 mg by mouth daily.    [provider]  senna-docusate (SENOKOT-S) 8.6-50 MG tablet Take 2 tablets by mouth at bedtime as needed for moderate constipation. 04/26/22   Miguel Rota, MD      Allergies    Iodine-131, Iohexol, Buprenorphine, Levofloxacin, Sulfamethoxazole-trimethoprim, Zofran [ondansetron hcl], Heparin, and Morphine and codeine    Review of Systems   Review of Systems  Physical Exam Updated Vital Signs BP 135/88   Pulse 94   Temp 98.6 F (37 C) (Oral)   Resp 19   Ht 1.689 m (5' 6.5")   Wt 81.6 kg   SpO2 100%   BMI 28.62 kg/m  Physical Exam Vitals and nursing note reviewed.  Constitutional:      Appearance: She is well-developed. She is not diaphoretic.  HENT:     Head: Normocephalic and atraumatic.     Right Ear: External ear normal.     Left Ear: External ear normal.  Eyes:     General: No scleral icterus.       Right eye: No discharge.        Left eye: No discharge.     Conjunctiva/sclera: Conjunctivae  normal.  Neck:     Trachea: No tracheal deviation.  Cardiovascular:     Rate and Rhythm: Normal rate and regular rhythm.  Pulmonary:     Effort: Pulmonary effort is normal. No respiratory distress.     Breath sounds: Normal breath sounds. No stridor. No wheezing or rales.  Chest:     Comments: Ttp left chest wall  Abdominal:     General: Bowel sounds are normal. There is no distension.     Palpations: Abdomen is soft.     Tenderness: There is no abdominal tenderness. There is no guarding or rebound.  Musculoskeletal:        General: No tenderness or deformity.     Cervical back: Neck supple.  Skin:    General: Skin is warm and dry.     Findings: No rash.  Neurological:     General: No focal deficit present.     Mental Status: She is alert.     Cranial Nerves: No cranial nerve deficit, dysarthria or facial asymmetry.     Sensory: No sensory deficit.     Motor: No abnormal muscle tone or seizure activity.     Coordination: Coordination normal.  Psychiatric:        Mood and Affect: Mood normal.     ED Results / Procedures / Treatments   Labs (all labs ordered are listed, but only abnormal results are displayed) Labs Reviewed  CBC - Abnormal; Notable for the following components:      Result Value   WBC 12.2 (*)    RDW 15.7 (*)    All other components within normal limits  COMPREHENSIVE METABOLIC PANEL - Abnormal; Notable for the following components:   Total Protein 8.3 (*)    All other components within normal limits  LIPASE, BLOOD  D-DIMER, QUANTITATIVE  TROPONIN I (HIGH SENSITIVITY)  TROPONIN I (HIGH SENSITIVITY)    EKG EKG Interpretation Date/Time:  Thursday November 11 2022 10:39:39 EDT Ventricular Rate:  93 PR Interval:  130 QRS Duration:  95 QT Interval:  362 QTC Calculation: 451 R Axis:   -8  Text Interpretation: Sinus rhythm Nonspecific T abnormalities, diffuse leads No significant change since 01 aug 24 Confirmed by Linwood Dibbles [phone]) on 11/11/2022  1:00:38 PM  Radiology DG Chest Portable 1 View  Result Date: 11/11/2022 CLINICAL DATA:  Left-sided chest pain radiating to left neck and jaw. EXAM: PORTABLE CHEST 1 VIEW COMPARISON:  11/04/2022. FINDINGS: Low lung volume. There are patchy atelectatic changes at the lung bases. Bilateral lung fields are otherwise clear. No acute consolidation or lung collapse. Bilateral lateral costophrenic angles are clear. Stable cardio-mediastinal silhouette. No acute osseous abnormalities. Cervical spinal fixation hardware noted. The soft tissues are within  normal limits. IMPRESSION: *No active disease. Electronically Signed   By: Jules Schick M.D.   On: 11/11/2022 10:49    Procedures Procedures    Medications Ordered in ED Medications  ketorolac (TORADOL) 30 MG/ML injection 15 mg (15 mg Intravenous Given 11/11/22 0916)    ED Course/ Medical Decision Making/ A&P Clinical Course as of 11/11/22 1300  Thu Nov 11, 2022  9147 30-day oxycodone prescription supplied on October 14 [JK]  1019 CBC shows leukocytosis of 12.2. [JK]  1101 D-dimer negative.  Lipase normal.  Initial troponin normal [JK]  1101 Chest x-ray without acute abnormalities [JK]  1254 Serial  troponin is normal [JK]    Clinical Course User Index [JK] Linwood Dibbles, MD                                 Medical Decision Making Differential diagnosis includes but not limited to acute coronary syndrome, pneumonia pneumothorax pulmonary embolism musculoskeletal pain  Problems Addressed: Chest pain, unspecified type: acute illness or injury that poses a threat to life or bodily functions  Amount and/or Complexity of Data Reviewed Labs: ordered. Decision-making details documented in ED Course. Radiology: ordered and independent interpretation performed.  Risk Prescription drug management.   Patient presented to the ED for evaluation of chest pain.  Low risk heart score.  Low suspicion for PE.  Patient has reproducible chest wall  tenderness.  ED workup reassuring.  Serial troponins were normal.  Doubt acute coronary syndrome.  Low risk for PE.  D-dimer negative.  No evidence of pneumonia or pneumothorax on x-ray.  Suspect musculoskeletal etiology.  Patient does have oxycodone that she can take for pain at home  Evaluation and diagnostic testing in the emergency department does not suggest an emergent condition requiring admission or immediate intervention beyond what has been performed at this time.  The patient is safe for discharge and has been instructed to return immediately for worsening symptoms, change in symptoms or any other concerns.         Final Clinical Impression(s) / ED Diagnoses Final diagnoses:  Chest pain, unspecified type    Rx / DC Orders ED Discharge Orders     None         Linwood Dibbles, MD 11/11/22 1302

## 2022-11-12 ENCOUNTER — Other Ambulatory Visit: Payer: Self-pay | Admitting: Student

## 2022-11-12 DIAGNOSIS — R102 Pelvic and perineal pain: Secondary | ICD-10-CM

## 2022-11-16 ENCOUNTER — Ambulatory Visit: Payer: 59

## 2022-11-17 ENCOUNTER — Other Ambulatory Visit: Payer: 59

## 2022-12-01 ENCOUNTER — Ambulatory Visit: Payer: 59 | Attending: Cardiovascular Disease | Admitting: Cardiovascular Disease

## 2022-12-12 IMAGING — CR DG CHEST 2V
2 series · 2 of 2 positions shown · non-contrast
Comparison: 01/09/2021

CLINICAL DATA: Fall, chest pain

EXAM:
CHEST - 2 VIEW

[w chest lat]
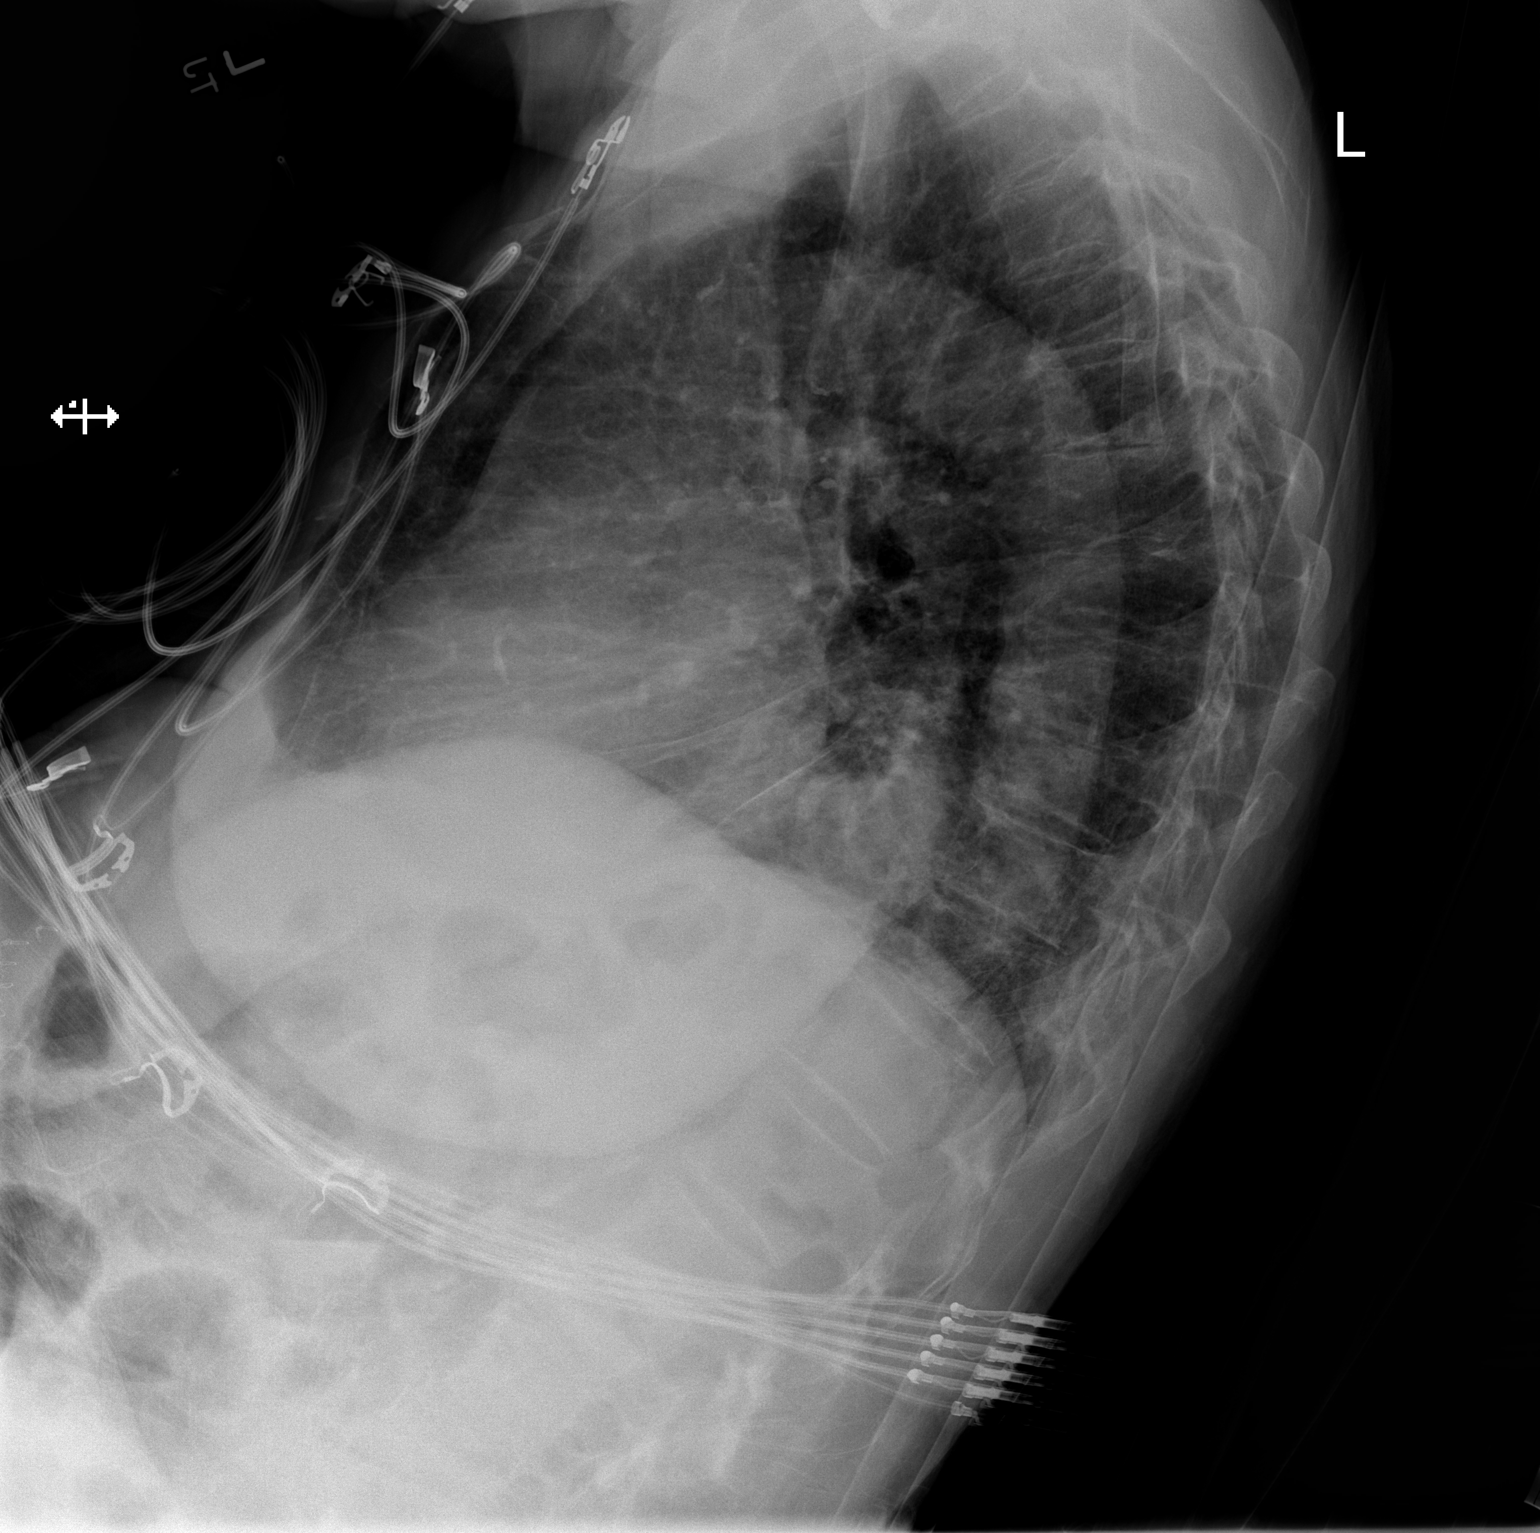

[x chest ap]
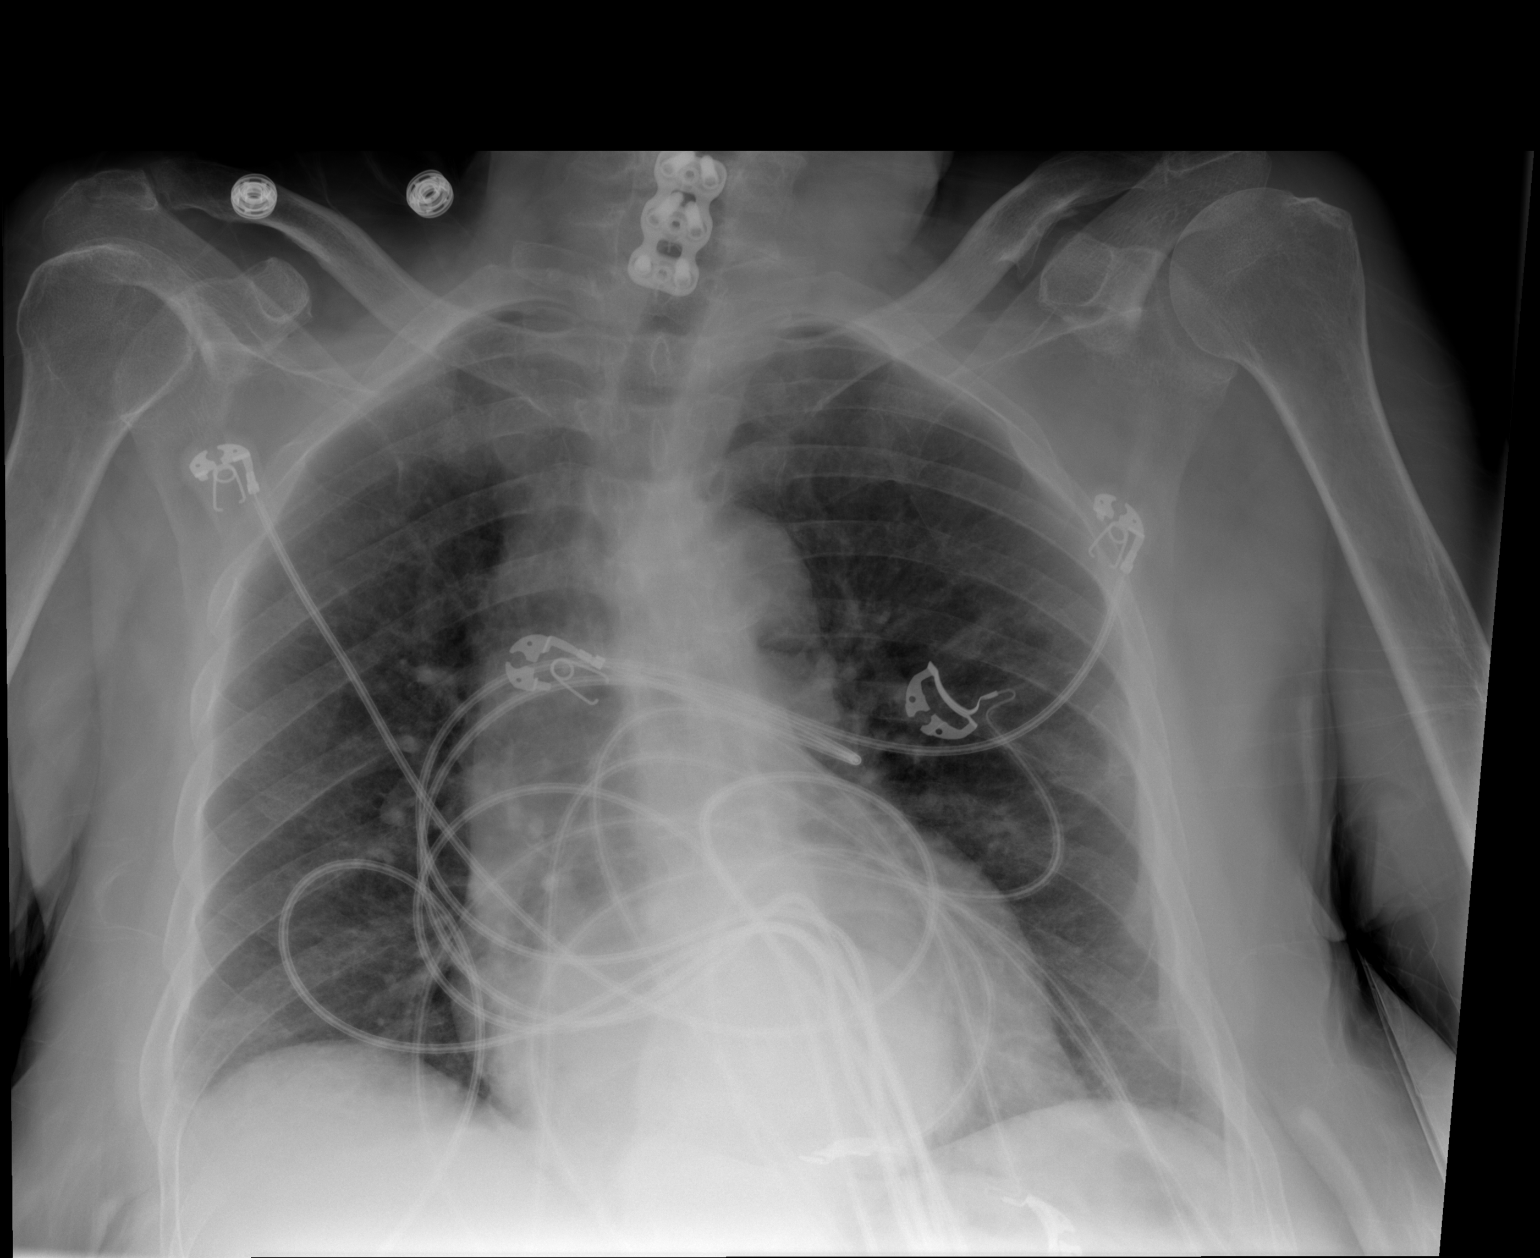

[2 of 2 positions shown; findings below may reference images not displayed]

FINDINGS: Normal mediastinum and cardiac silhouette. No effusion, infiltrate
or pneumothorax. Anterior cervical fusion.
IMPRESSION: No evidence of thoracic trauma.

## 2022-12-12 IMAGING — CT CT HEAD W/O CM
3 series · 15 of 47 positions shown, 18 images · non-contrast
Comparison: 04/02/2021 and prior studies.

CLINICAL DATA: 65-year-old female with head and neck injury from
fall. Initial encounter.



[Series 3: head wo · axial · 0.47mm/px · z∈[+1405,+1550]mm · 9 of 35 slices shown, 12 images]
[im 3/35  brain]
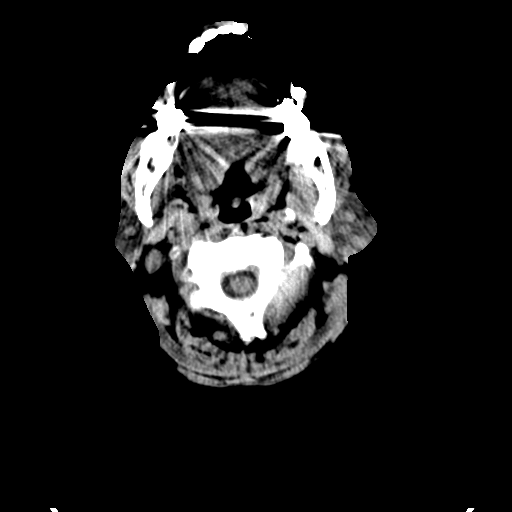
[im 3/35  bone]
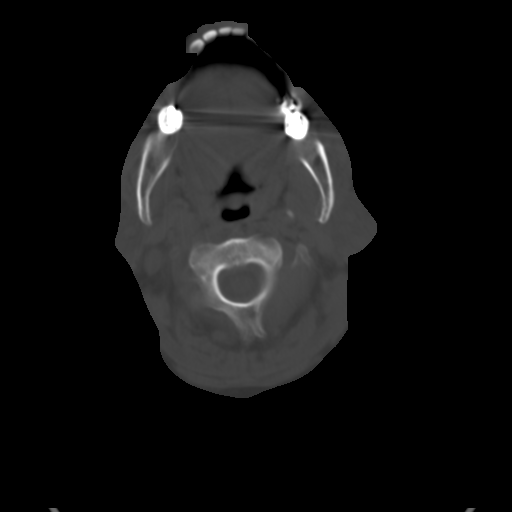
[im 6/35  brain]
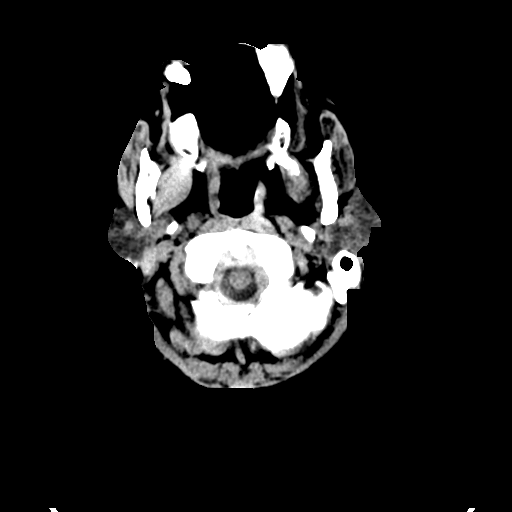
[im 10/35  brain]
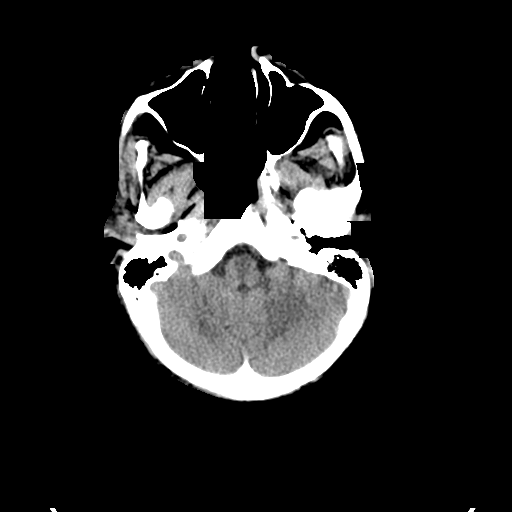
[im 13/35  brain]
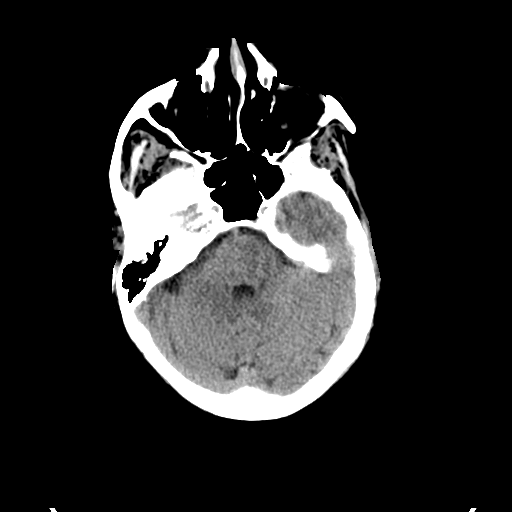
[im 18/35  brain]
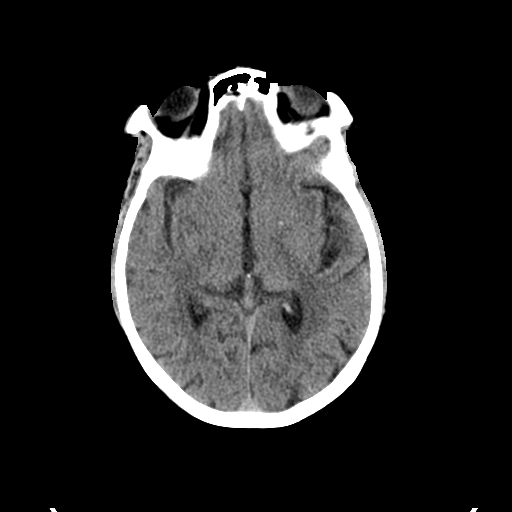
[im 18/35  bone]
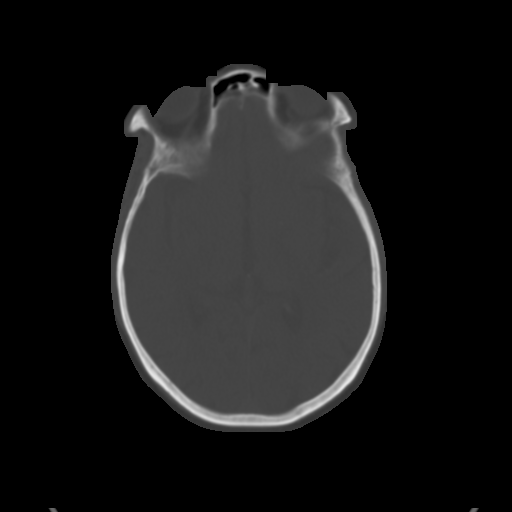
[im 22/35  brain]
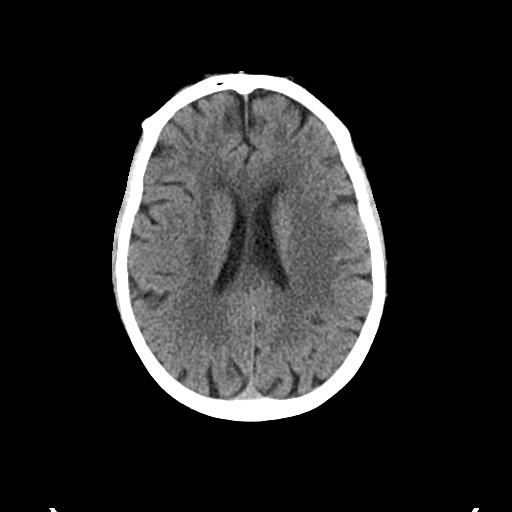
[im 25/35  brain]
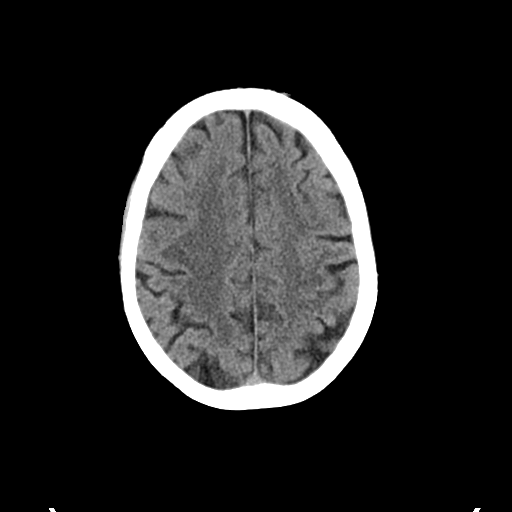
[im 29/35  brain]
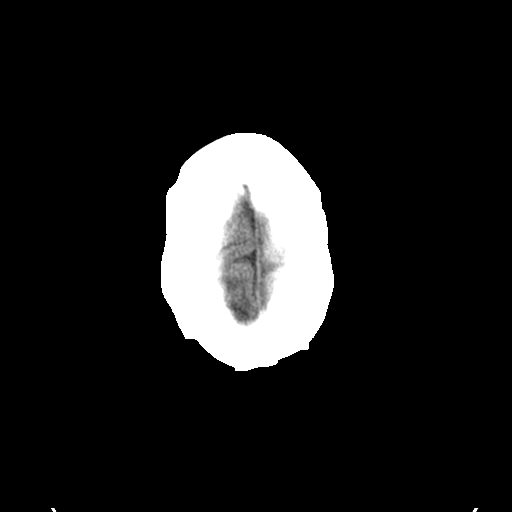
[im 32/35  brain]
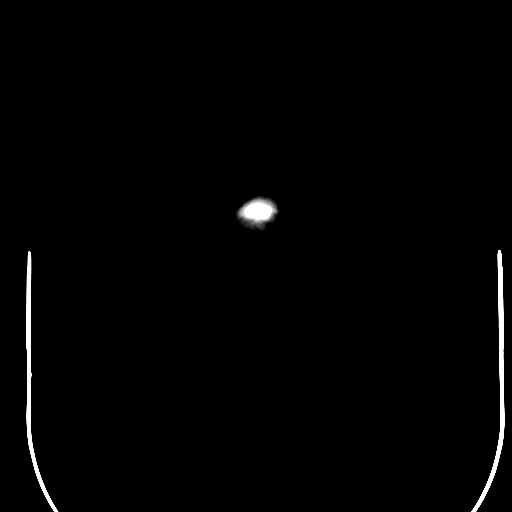
[im 32/35  bone]
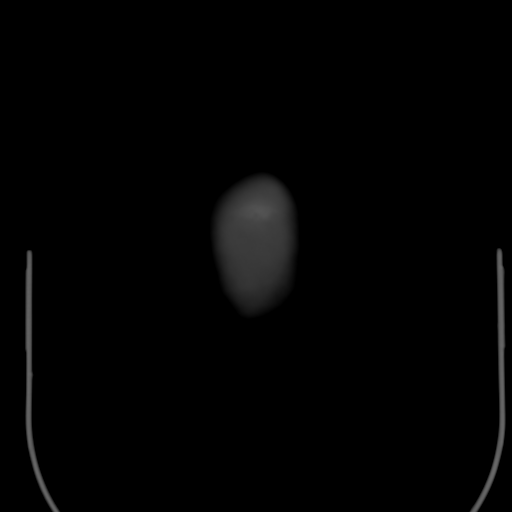

[Series 5: coronal soft tissue · coronal · 0.31mm/px · 3 of 67 slices shown]
[im 23/67  brain]
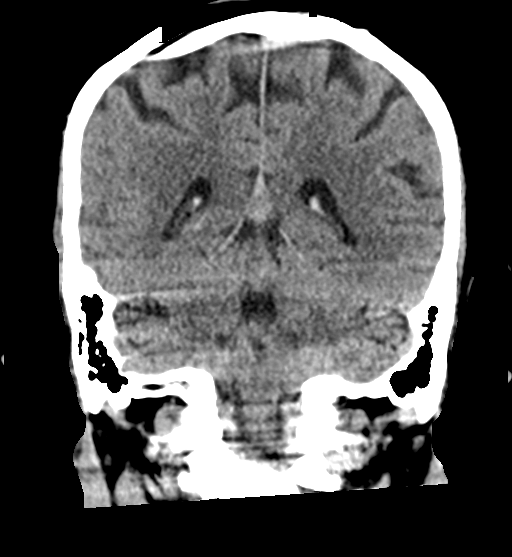
[im 30/67  brain]
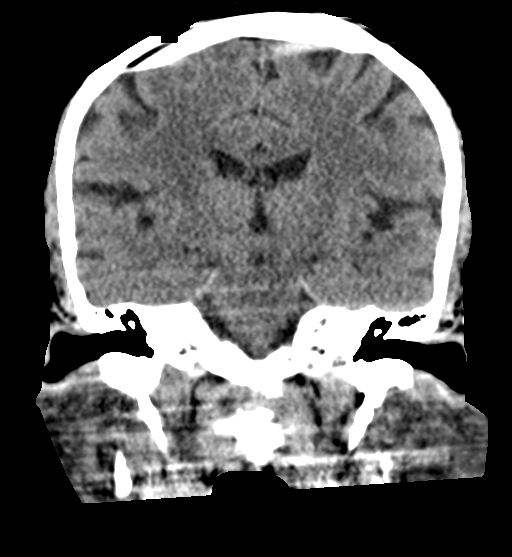
[im 37/67  brain]
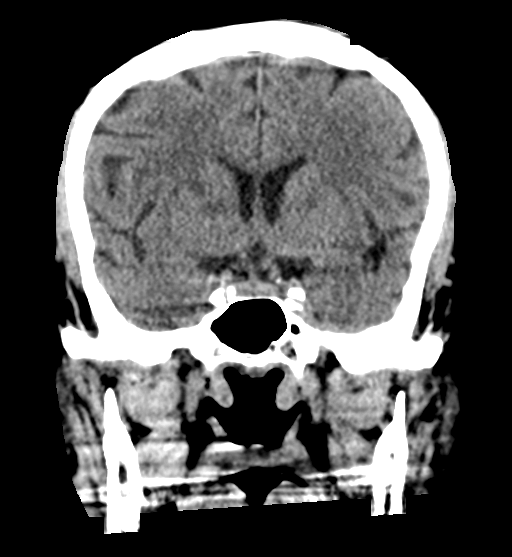

[Series 6: sagittal soft tissue · sagittal · 0.39mm/px · 3 of 53 slices shown]
[im 18/53  brain]
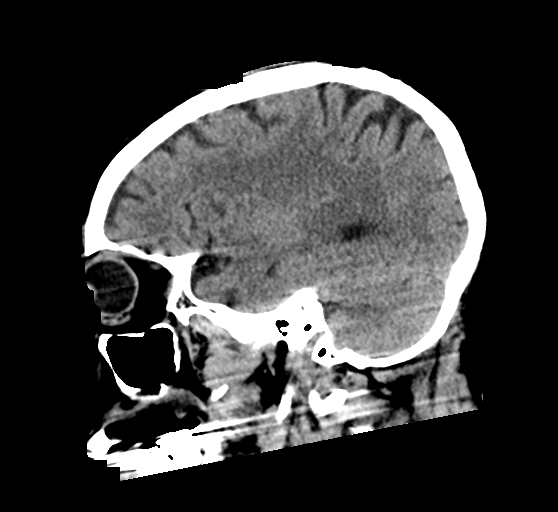
[im 27/53  brain]
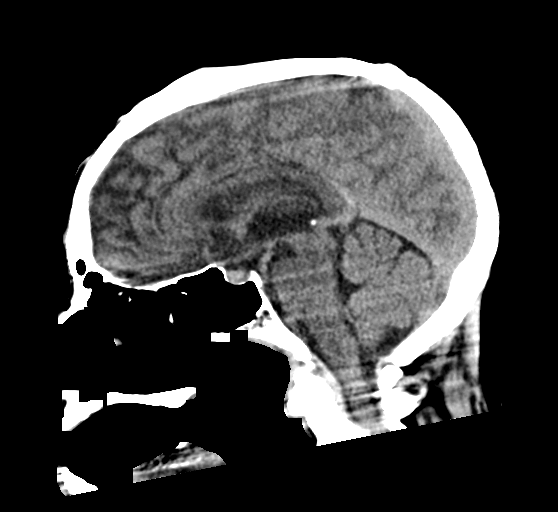
[im 35/53  brain]
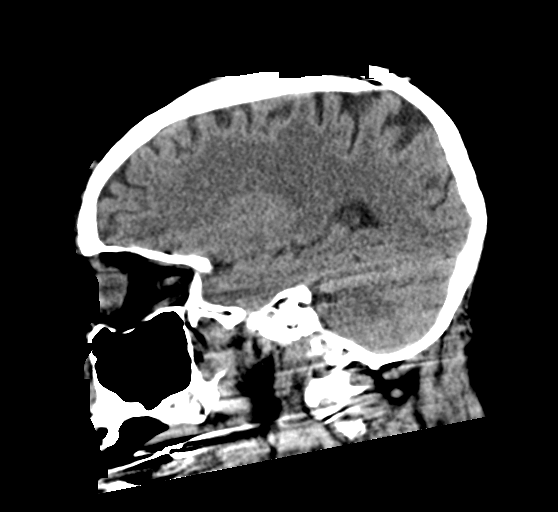

[15 of 47 positions shown; findings below may reference images not displayed]

FINDINGS: CT HEAD FINDINGS

Brain: No evidence of acute infarction, hemorrhage, hydrocephalus,
extra-axial collection or mass lesion/mass effect.

Atrophy and chronic small-vessel ischemic changes again noted.

Vascular: Carotid atherosclerotic calcifications are noted.

Skull: Normal. Negative for fracture or focal lesion.

Sinuses/Orbits: No acute finding.

Other: A 1.9 cm LEFT parotid mass is again noted.

CT CERVICAL SPINE FINDINGS

The patient would not remain still and there is extensive motion
artifact significantly limiting evaluation despite rescanning. No
gross acute abnormalities noted. Fusion changes from C5-C7 again
noted.
IMPRESSION: 1. No evidence of acute intracranial abnormality. Atrophy and
chronic small-vessel ischemic changes.
2. Motion artifact significantly limiting evaluation of the cervical
spine despite rescanning. No gross acute abnormalities identified.
Consider sedation and rescanning as clinically indicated.
3. Unchanged 1.9 cm LEFT parotid mass.

## 2022-12-12 IMAGING — CT CT CERVICAL SPINE W/O CM
3 of 8 series · 10 of 34 positions shown, 11 images · non-contrast
Comparison: 04/02/2021 and prior studies.

CLINICAL DATA: 65-year-old female with head and neck injury from
fall. Initial encounter.



[Series 5: orthogonal bone · axial · 0.49mm/px · z∈[+1234,+1340]mm · 3 of 122 slices shown, 4 images]
[im 31/122  soft-tissue]
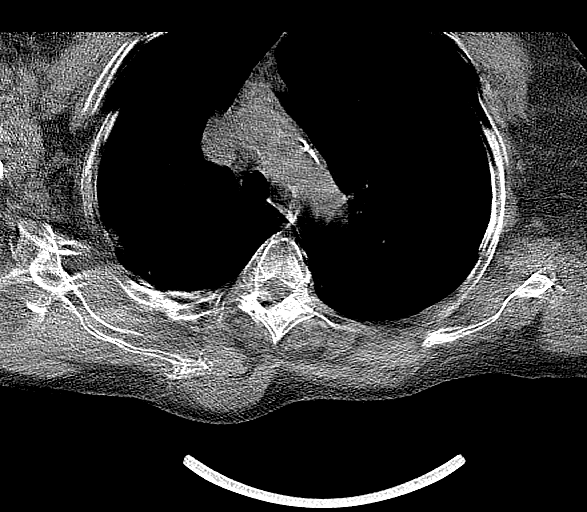
[im 31/122  bone]
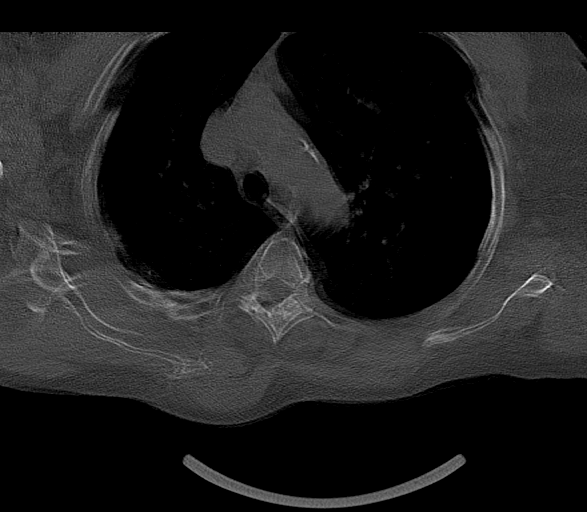
[im 61/122  bone]
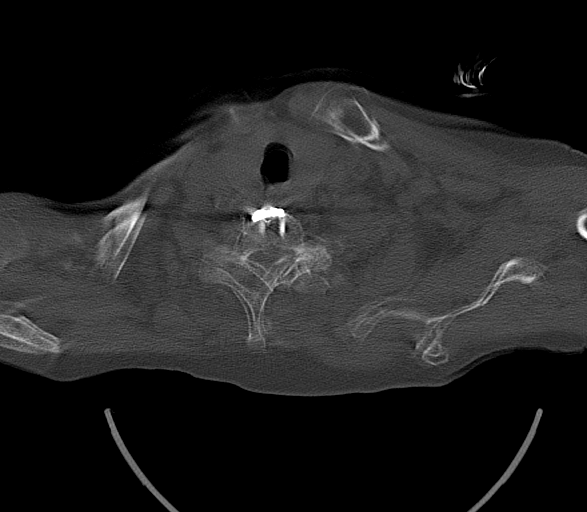
[im 91/122  bone]
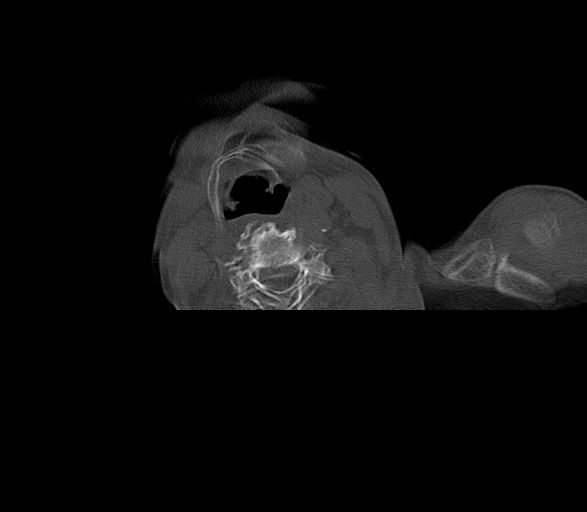

[Series 7: sagittal bone · sagittal · 0.44mm/px · 5 of 230 slices shown]
[im 39/230  bone]
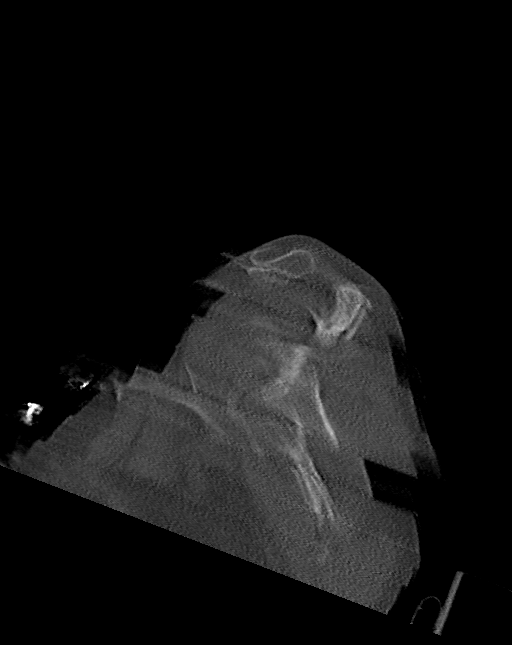
[im 77/230  bone]
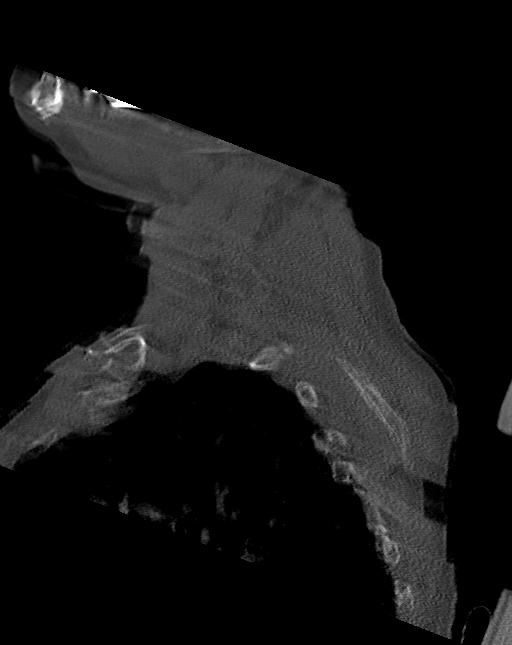
[im 115/230  bone]
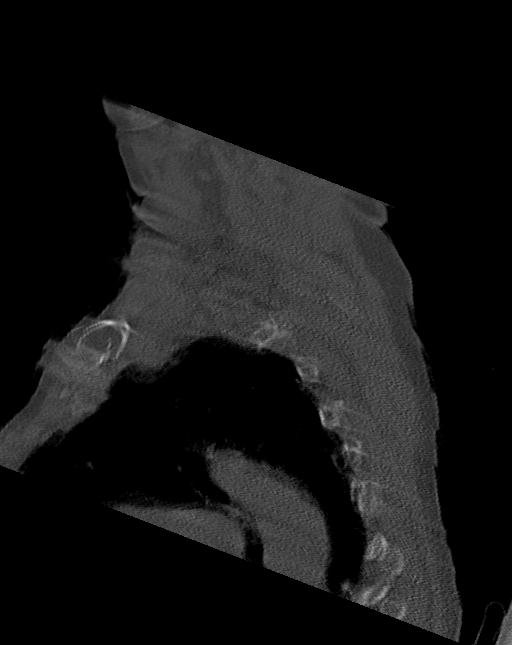
[im 153/230  bone]
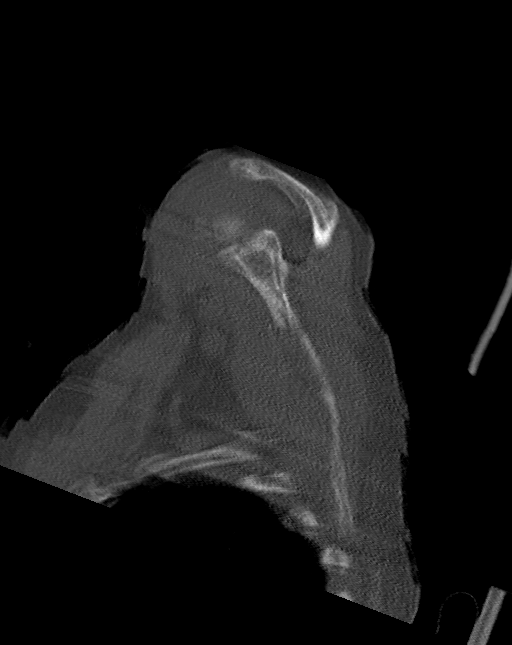
[im 191/230  bone]
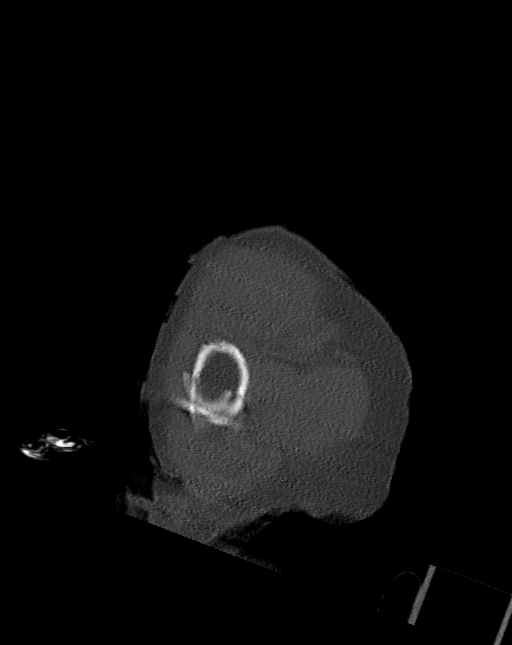

[Series 14: coronal bone · coronal · 0.37mm/px · 2 of 131 slices shown]
[im 44/131  bone]
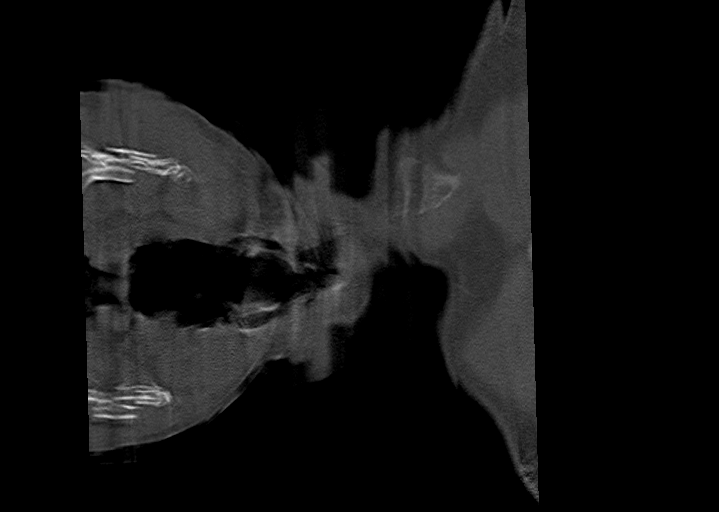
[im 87/131  bone]
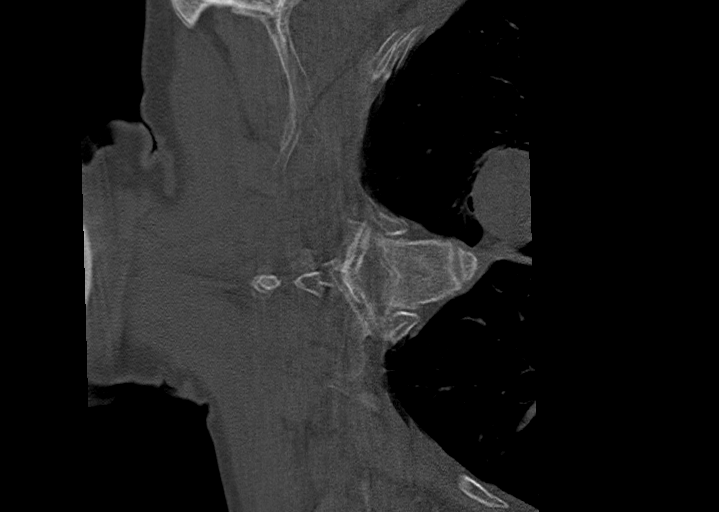

[10 of 34 positions shown; findings below may reference images not displayed]

FINDINGS: CT HEAD FINDINGS

Brain: No evidence of acute infarction, hemorrhage, hydrocephalus,
extra-axial collection or mass lesion/mass effect.

Atrophy and chronic small-vessel ischemic changes again noted.

Vascular: Carotid atherosclerotic calcifications are noted.

Skull: Normal. Negative for fracture or focal lesion.

Sinuses/Orbits: No acute finding.

Other: A 1.9 cm LEFT parotid mass is again noted.

CT CERVICAL SPINE FINDINGS

The patient would not remain still and there is extensive motion
artifact significantly limiting evaluation despite rescanning. No
gross acute abnormalities noted. Fusion changes from C5-C7 again
noted.
IMPRESSION: 1. No evidence of acute intracranial abnormality. Atrophy and
chronic small-vessel ischemic changes.
2. Motion artifact significantly limiting evaluation of the cervical
spine despite rescanning. No gross acute abnormalities identified.
Consider sedation and rescanning as clinically indicated.
3. Unchanged 1.9 cm LEFT parotid mass.

## 2022-12-12 IMAGING — CR DG WRIST COMPLETE 3+V*L*
4 series · 4 of 4 positions shown · non-contrast
Comparison: None Available.

CLINICAL DATA: Fall, pain

EXAM:
LEFT WRIST - COMPLETE 3+ VIEW

[x wrist pa left]
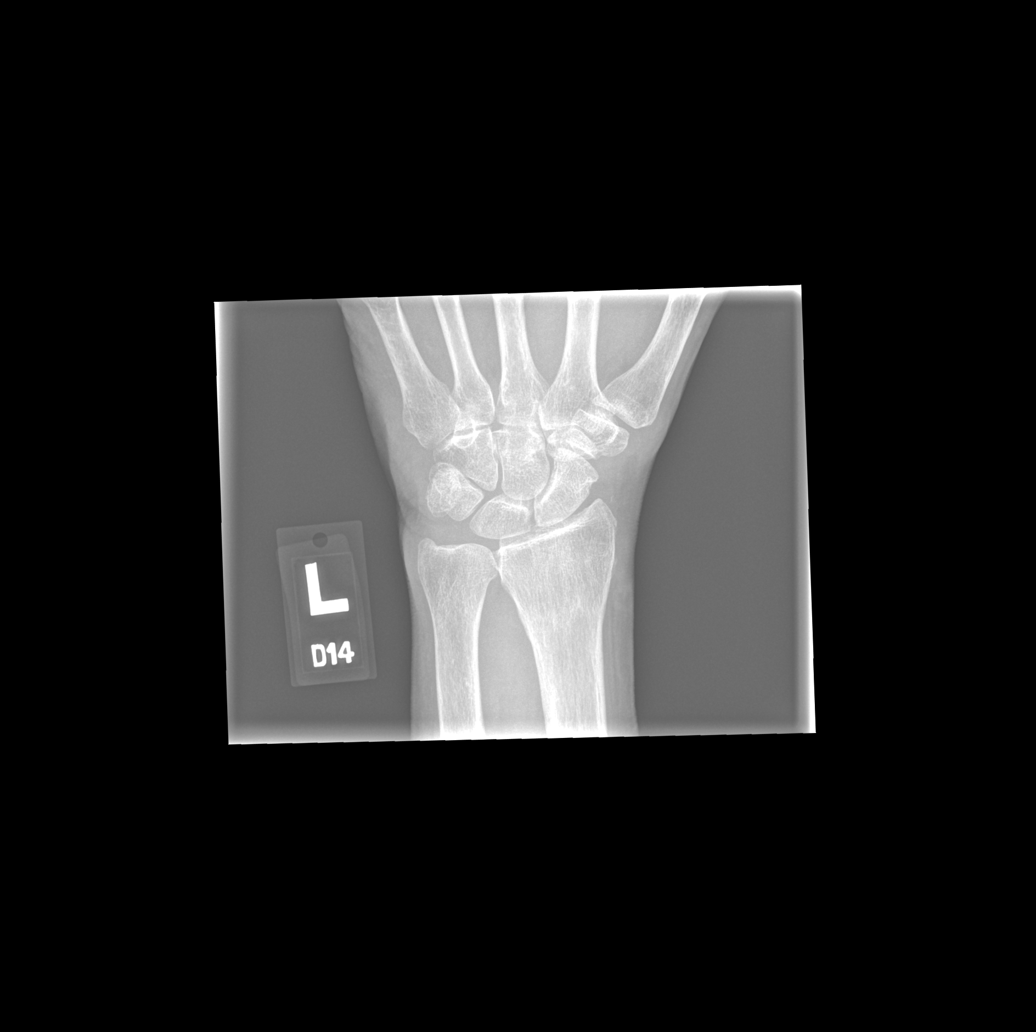

[x wrist obl left]
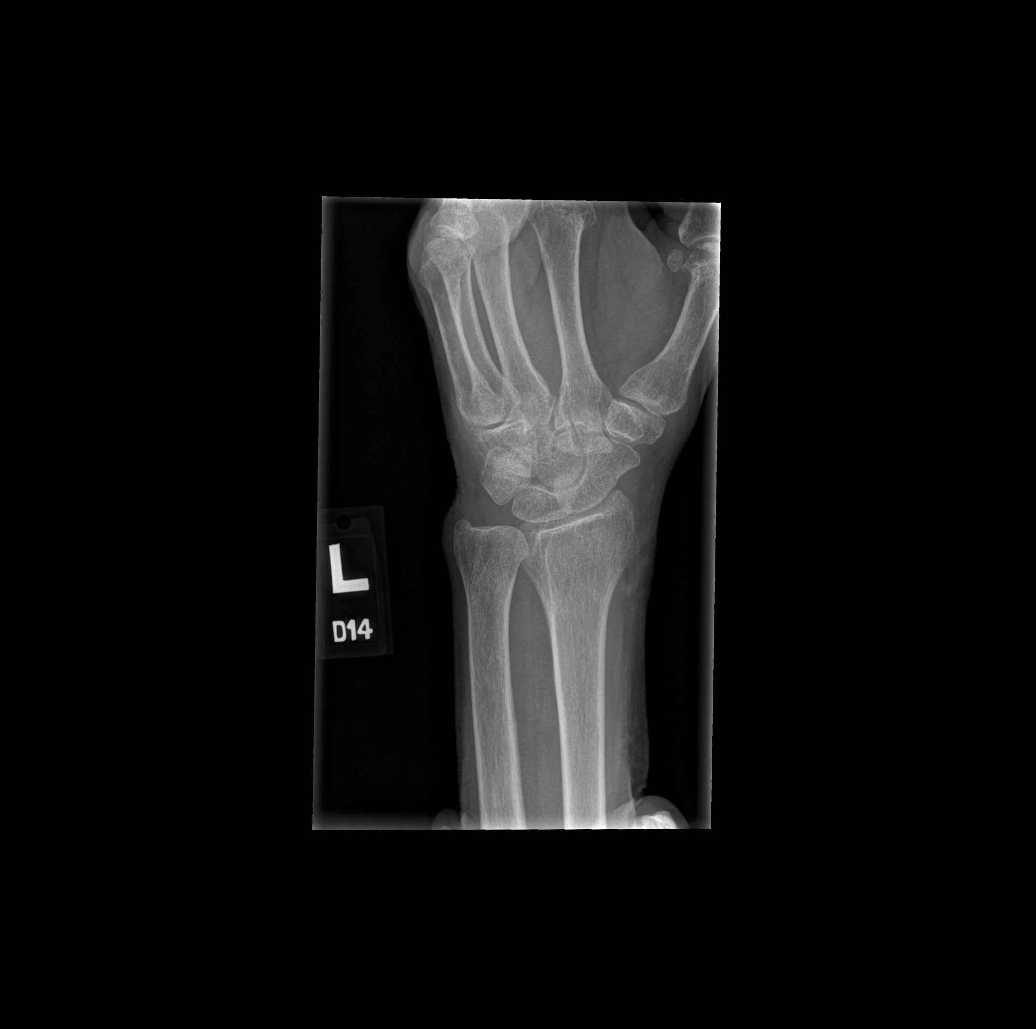

[x wrist lat left]
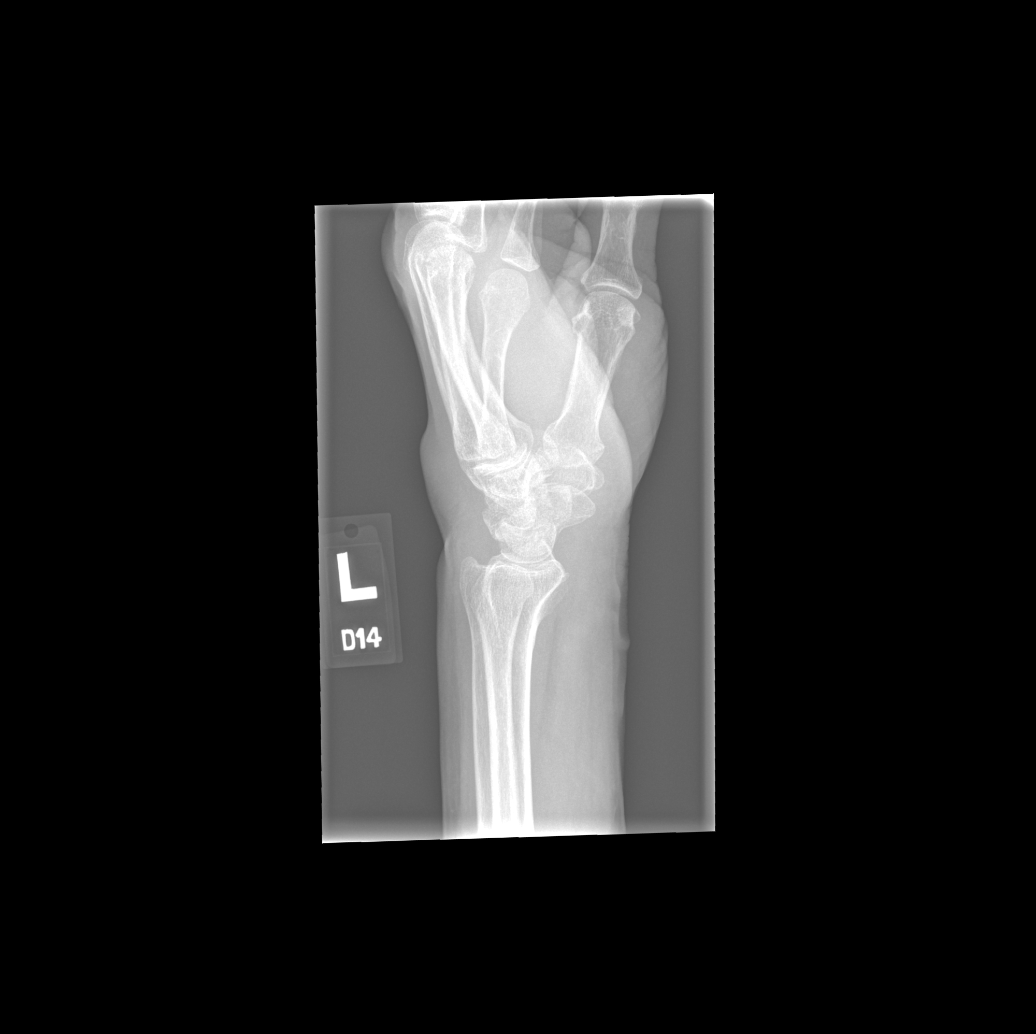

[x wrist navicular view left]
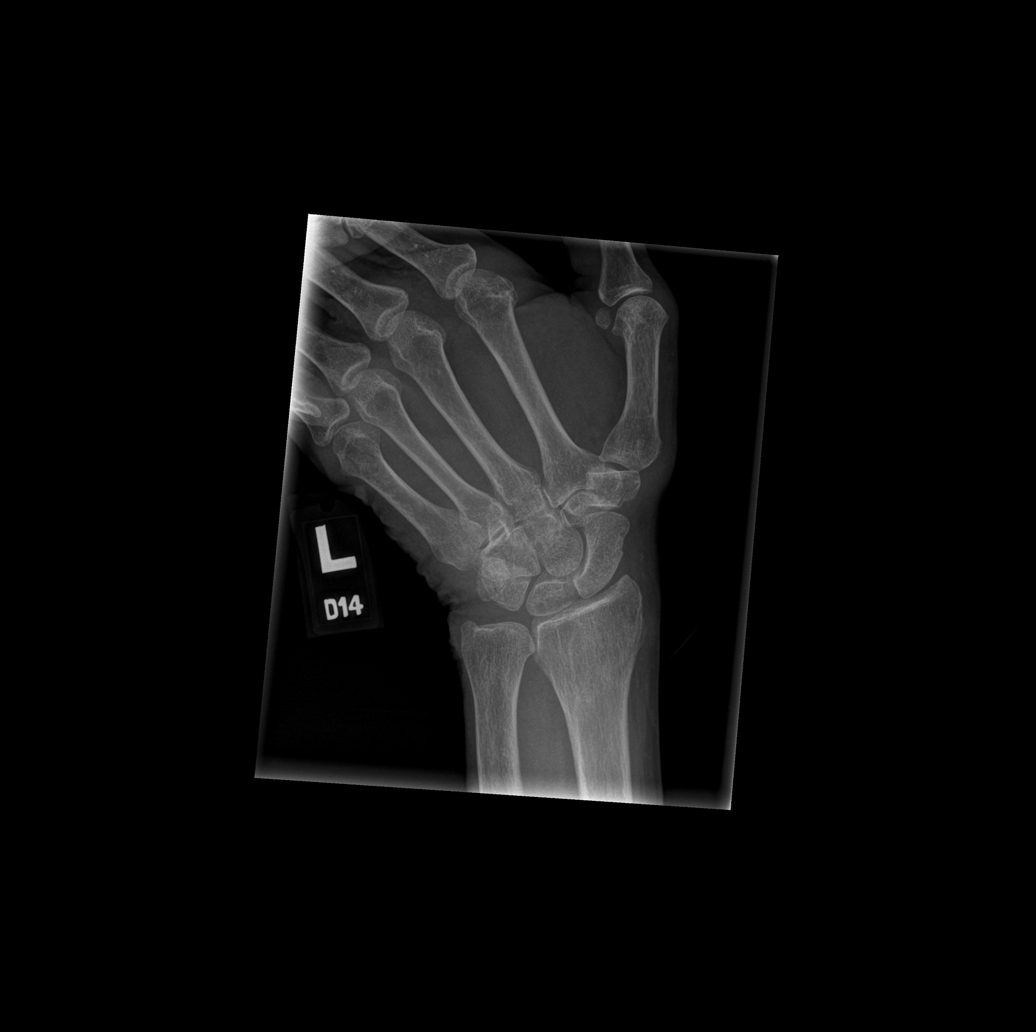

[4 of 4 positions shown; findings below may reference images not displayed]

FINDINGS: There is no evidence of fracture or dislocation. There is no
evidence of arthropathy or other focal bone abnormality. Soft
tissues are unremarkable.
IMPRESSION: No fracture or dislocation of the left wrist. The carpus is normally
aligned.

## 2022-12-21 ENCOUNTER — Emergency Department (HOSPITAL_COMMUNITY)
Admission: EM | Admit: 2022-12-21 | Discharge: 2022-12-21 | Disposition: A | Discharge status: Home / Self Care | Payer: 59 | Attending: Emergency Medicine | Admitting: Emergency Medicine

## 2022-12-21 ENCOUNTER — Emergency Department (HOSPITAL_COMMUNITY): Payer: 59

## 2022-12-21 ENCOUNTER — Encounter (HOSPITAL_COMMUNITY): Payer: Self-pay

## 2022-12-21 DIAGNOSIS — W101XXA Fall (on)(from) sidewalk curb, initial encounter: Secondary | ICD-10-CM | POA: Insufficient documentation

## 2022-12-21 DIAGNOSIS — W19XXXA Unspecified fall, initial encounter: Secondary | ICD-10-CM

## 2022-12-21 DIAGNOSIS — M542 Cervicalgia: Secondary | ICD-10-CM | POA: Diagnosis present

## 2022-12-21 DIAGNOSIS — R519 Headache, unspecified: Secondary | ICD-10-CM | POA: Insufficient documentation

## 2022-12-21 LAB — BASIC METABOLIC PANEL
Anion gap: 8 (ref 5–15)
BUN: 24 mg/dL — ABNORMAL HIGH (ref 8–23)
CO2: 17 mmol/L — ABNORMAL LOW (ref 22–32)
Calcium: 9.2 mg/dL (ref 8.9–10.3)
Chloride: 114 mmol/L — ABNORMAL HIGH (ref 98–111)
Creatinine, Ser: 1.15 mg/dL — ABNORMAL HIGH (ref 0.44–1.00)
GFR, Estimated: 52 mL/min — ABNORMAL LOW (ref >60)
Glucose, Bld: 109 mg/dL — ABNORMAL HIGH (ref 70–99)
Potassium: 3.6 mmol/L (ref 3.5–5.1)
Sodium: 139 mmol/L (ref 135–145)

## 2022-12-21 LAB — CBC
HCT: 37.1 % (ref 36.0–46.0)
Hemoglobin: 12 g/dL (ref 12.0–15.0)
MCH: 27.3 pg (ref 26.0–34.0)
MCHC: 32.3 g/dL (ref 30.0–36.0)
MCV: 84.3 fL (ref 80.0–100.0)
Platelets: 422 10*3/uL — ABNORMAL HIGH (ref 150–400)
RBC: 4.4 MIL/uL (ref 3.87–5.11)
RDW: 16.4 % — ABNORMAL HIGH (ref 11.5–15.5)
WBC: 7.5 10*3/uL (ref 4.0–10.5)
nRBC: 0 % (ref 0.0–0.2)

## 2022-12-21 MED ORDER — OXYCODONE-ACETAMINOPHEN 5-325 MG PO TABS
2.0000 | ORAL_TABLET | Freq: Once | ORAL | Status: AC
  Administered 2022-12-21: 2 via ORAL
  Filled 2022-12-21: qty 2

## 2022-12-21 MED ORDER — DIPHENHYDRAMINE HCL 25 MG PO CAPS
25.0000 mg | ORAL_CAPSULE | Freq: Once | ORAL | Status: AC
  Administered 2022-12-21: 25 mg via ORAL
  Filled 2022-12-21: qty 1

## 2022-12-21 NOTE — ED Provider Notes (Signed)
[place] EMERGENCY DEPARTMENT AT [facility] Provider Note   CSN: [id number] Arrival date & time: 12/21/22  2952     History Chief Complaint  Patient presents with   Fall    HPI  - Chief Complaint: Pain and injuries from multiple falls  - History of Present Illness: Missed scheduled pain specialist appointment yesterday. Subsequently fell off a curb while leaving the doctor's office, landing in the street where bystanders assisted. Later that day, experienced another fall from a 3-foot high bed. Reports hitting head during falls. Currently experiencing pain in head and neck, particularly on the left side, with radiation to the back of neck.  - Past Medical History: No further pertinent information beyond existing medical record.  - Past Surgical History: No further pertinent information beyond existing medical record.  Patient's recorded medical, surgical, social, medication list and allergies were reviewed in the Snapshot window as part of the initial history.   Review of Systems   Review of Systems - Pertinent Review of Systems: Reports head pain with associated burning sensation. Notes neck pain, specifically on left side.   Physical Exam Updated Vital Signs BP (!) 147/83 (BP Location: Left Arm)   Pulse 73   Temp 97.7 F (36.5 C) (Oral)   Resp 16   SpO2 98%  Physical Exam Vitals and nursing note reviewed.  Constitutional:      General: She is not in acute distress.    Appearance: She is well-developed.  HENT:     Head: Normocephalic and atraumatic.  Eyes:     Conjunctiva/sclera: Conjunctivae normal.  Cardiovascular:     Rate and Rhythm: Normal rate and regular rhythm.     Heart sounds: No murmur heard. Pulmonary:     Effort: Pulmonary effort is normal. No respiratory distress.     Breath sounds: Normal breath sounds.  Abdominal:     General: There is no distension.     Palpations: Abdomen is soft.     Tenderness: There is no abdominal  tenderness. There is no right CVA tenderness or left CVA tenderness.  Musculoskeletal:        General: No swelling or tenderness. Normal range of motion.     Cervical back: Neck supple.  Skin:    General: Skin is warm and dry.  Neurological:     General: No focal deficit present.     Mental Status: She is alert and oriented to person, place, and time. Mental status is at baseline.     Cranial Nerves: No cranial nerve deficit.      ED Course/ Medical Decision Making/ A&P    Procedures Procedures   Medications Ordered in ED Medications  oxyCODONE-acetaminophen (PERCOCET/ROXICET) 5-325 MG per tablet 2 tablet (2 tablets Oral Given 12/21/22 1026)  oxyCODONE-acetaminophen (PERCOCET/ROXICET) 5-325 MG per tablet 2 tablet (2 tablets Oral Given 12/21/22 1623)   Medical Decision Making:    [name] is a 67 y.o. female who presented to the ED today with a moderate mechanisma trauma, detailed above.    Given this mechanism of trauma, a full physical exam was performed. Notably, patient was HDS in NAD.   Reviewed and confirmed nursing documentation for past medical history, family history, social history.    Initial Assessment/Plan:   This is a patient presenting with a moderate mechanism trauma.  As such, I have considered intracranial injuries including intracranial hemorrhage, intrathoracic injuries including blunt myocardial or blunt lung injury, blunt abdominal injuries including aortic dissection, bladder injury, spleen injury, liver  injury and I have considered orthopedic injuries including extremity or spinal injury.  With the patient's presentation of moderate mechanism trauma but an otherwise reassuring exam, patient warrants targeted evaluation for potential traumatic injuries. Will proceed with targeted evaluation for potential injuries. Will proceed with CT H/Cspine.  Going forward ordered in triage as well of left-sided lower extremity.  Objective evaluation resulted with no  acute allergy.  Images reviewed and I agree with radiology reads..   Final Reassessment and Plan:   Patient asking for pain medication refills.  Informed her that she needs to follow-up with her primary outpatient pain medicine clinic and then further administration for prescription of outpatient pain controlled substances would invalidate her pain control contract and patient expressed understanding.  Pain under control here and patient in no acute distress at this time.   Disposition:  I have considered need for hospitalization, however, considering all of the above, I believe this patient is stable for discharge at this time.  Patient/family educated about specific return precautions for given chief complaint and symptoms.  Patient/family educated about follow-up with PCP.     Patient/family expressed understanding of return precautions and need for follow-up. Patient spoken to regarding all imaging and laboratory results and appropriate follow up for these results. All education provided in verbal form with additional information in written form. Time was allowed for answering of patient questions. Patient discharged.    Emergency Department Medication Summary:   Medications  oxyCODONE-acetaminophen (PERCOCET/ROXICET) 5-325 MG per tablet 2 tablet (2 tablets Oral Given 12/21/22 1026)  oxyCODONE-acetaminophen (PERCOCET/ROXICET) 5-325 MG per tablet 2 tablet (2 tablets Oral Given 12/21/22 1623)           Clinical Impression:  1. Fall, initial encounter      Discharge   Final Clinical Impression(s) / ED Diagnoses Final diagnoses:  Fall, initial encounter    Rx / DC Orders ED Discharge Orders     None         Glyn Ade, MD 12/21/22 1645

## 2022-12-21 NOTE — ED Notes (Signed)
Patient transported to X-ray 

## 2022-12-21 NOTE — ED Triage Notes (Addendum)
Pt arrived via EMS, from home, multiple falls out of bed at home last night, states rolled out of bed while sleeping C/o entire left side pain. Worse with mvmt/palpation.  No LOC no thinners.

## 2022-12-21 NOTE — ED Provider Triage Note (Signed)
Emergency Medicine Provider Triage Evaluation Note  Darien Standridge , a 67 y.o. female  was evaluated in triage.  Pt complains of falls with pain to the left side of body.  Review of Systems  Positive: Left sided submandibular swelling has been present, left shoulder pain, dizziness, pain throughout the left side of her body Negative: Blood thinners  Physical Exam  BP (!) 140/96 (BP Location: Right Arm)   Pulse 91   Temp 98.1 F (36.7 C) (Oral)   Resp 18   SpO2 98%  Gen:   Awake, no distress patient appears generally uncomfortable Resp:  Normal effort  MSK:   Moves extremities without difficulty patient is holding left shoulder adducted Other:  Tenderness to palpation over cervical spine, left shoulder, left wrist, left hip, left ankle,  Medical Decision Making  Medically screening exam initiated at 10:21 AM.  Appropriate orders placed.  Memoree Ravis was informed that the remainder of the evaluation will be completed by another provider, this initial triage assessment does not replace that evaluation, and the importance of remaining in the ED until their evaluation is complete.  Will review record, order imaging and labs as appropriate   Margarita Grizzle, MD 12/22/22 1444

## 2022-12-21 NOTE — ED Notes (Signed)
[facility] will be called for pick up

## 2022-12-21 NOTE — ED Notes (Signed)
[facility] called    she is 8th on the list

## 2023-01-12 IMAGING — CT CT HEAD W/O CM
3 series · 15 of 46 positions shown, 18 images · non-contrast
Comparison: 06/01/2021

CLINICAL DATA: Fall from bed with head injury and neck pain



[Series 3: head wo · axial · 0.46mm/px · z∈[+1424,+1544]mm · 9 of 29 slices shown, 12 images]
[im 3/29  brain]
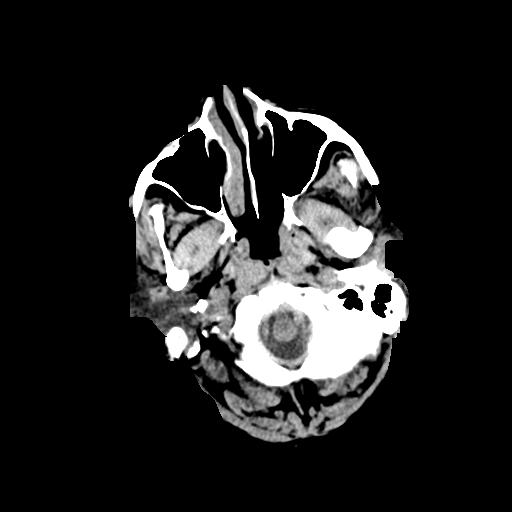
[im 3/29  bone]
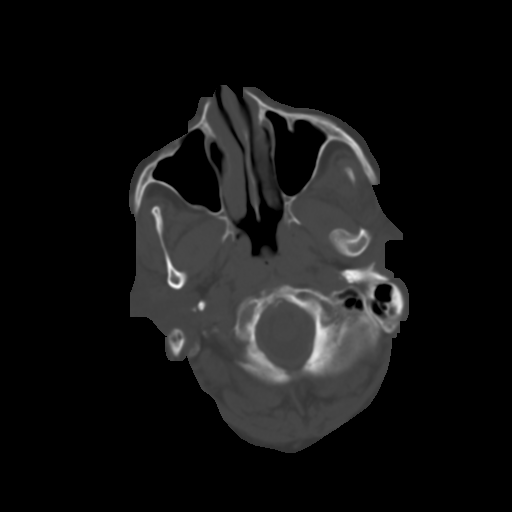
[im 6/29  brain]
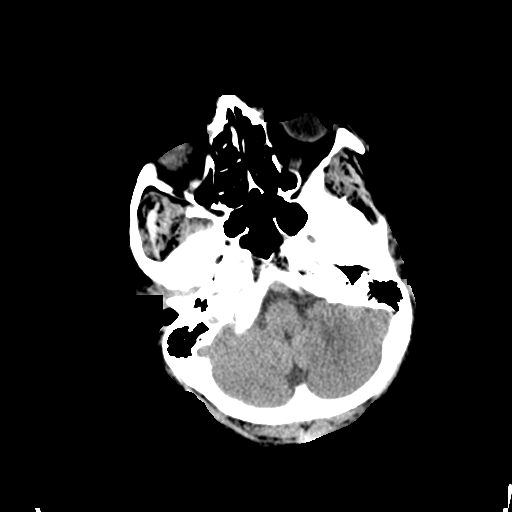
[im 9/29  brain]
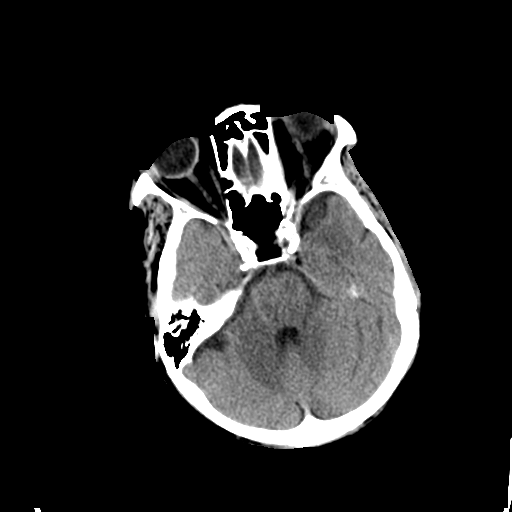
[im 12/29  brain]
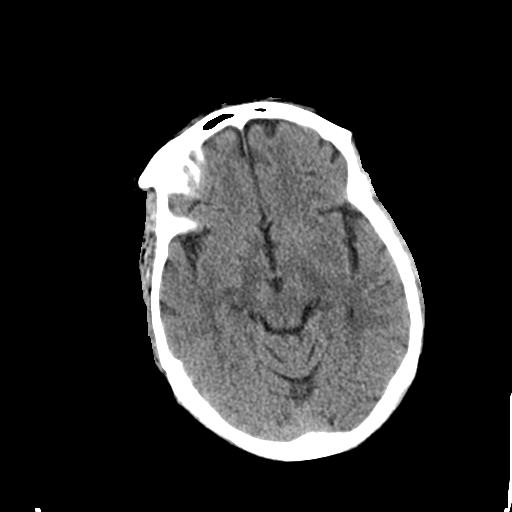
[im 15/29  brain]
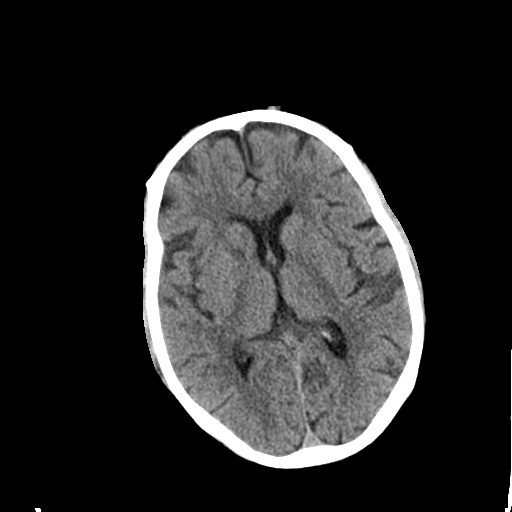
[im 15/29  bone]
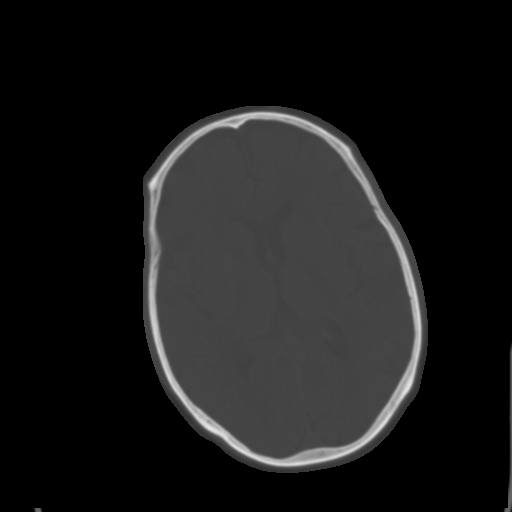
[im 18/29  brain]
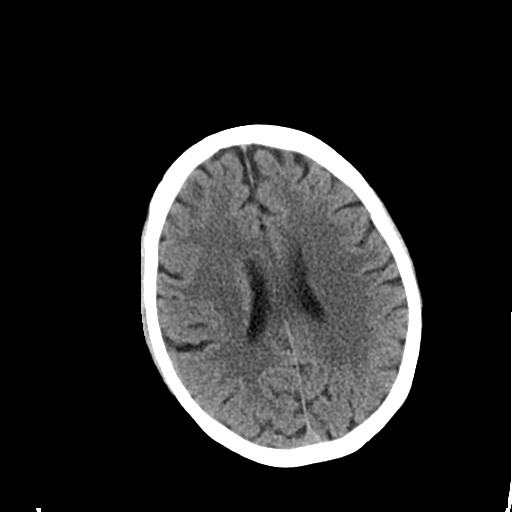
[im 21/29  brain]
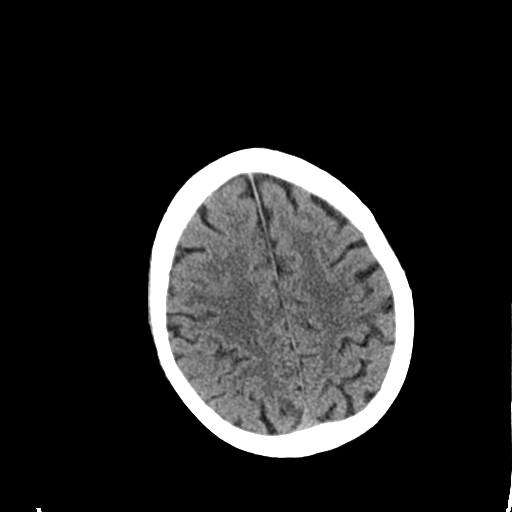
[im 24/29  brain]
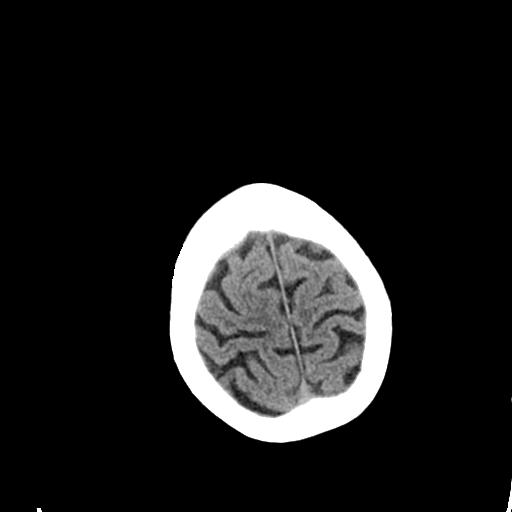
[im 27/29  brain]
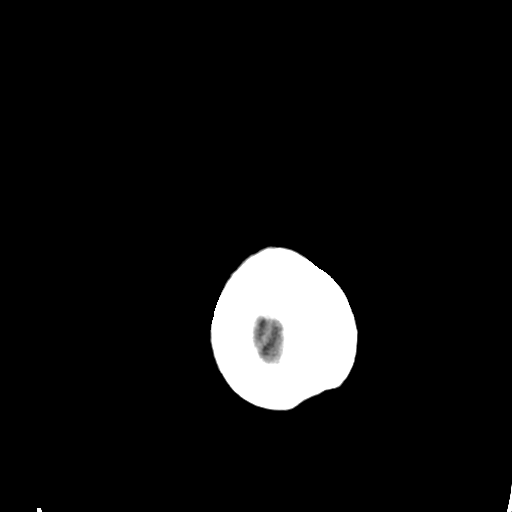
[im 27/29  bone]
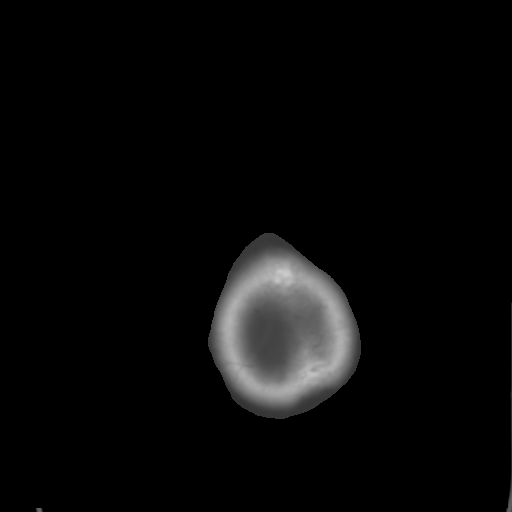

[Series 5: coronal soft tissue · coronal · 0.27mm/px · 3 of 61 slices shown]
[im 21/61  brain]
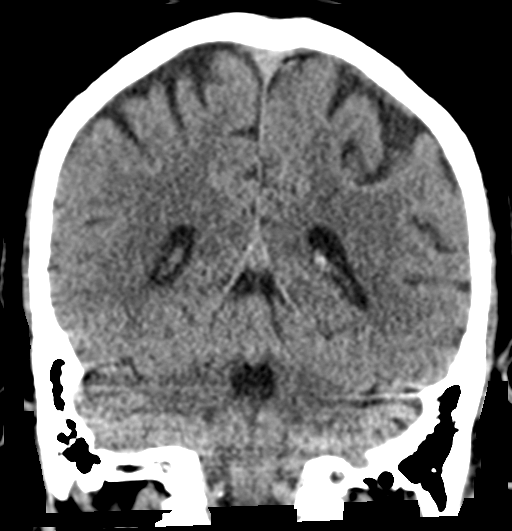
[im 27/61  brain]
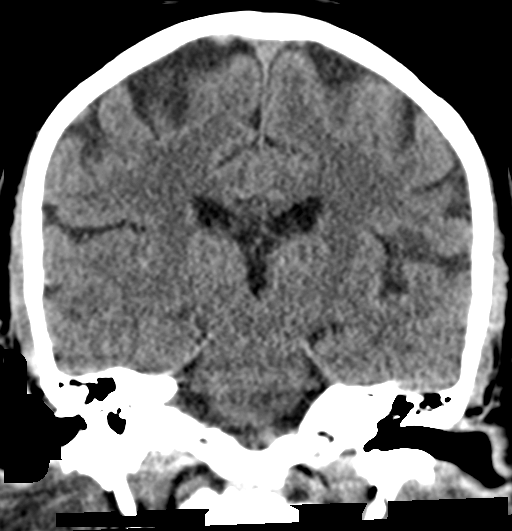
[im 34/61  brain]
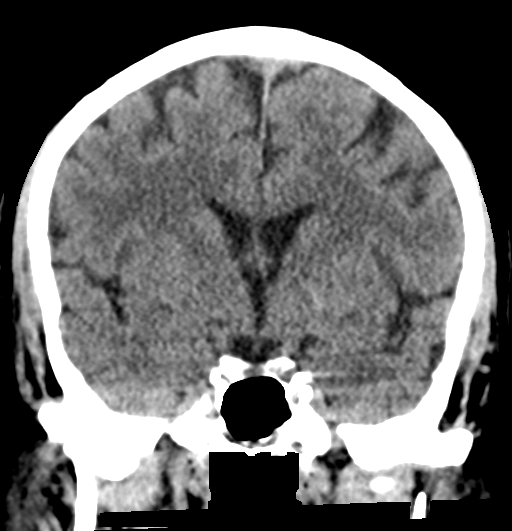

[Series 6: sagittal soft tissue · sagittal · 0.29mm/px · 3 of 44 slices shown]
[im 15/44  brain]
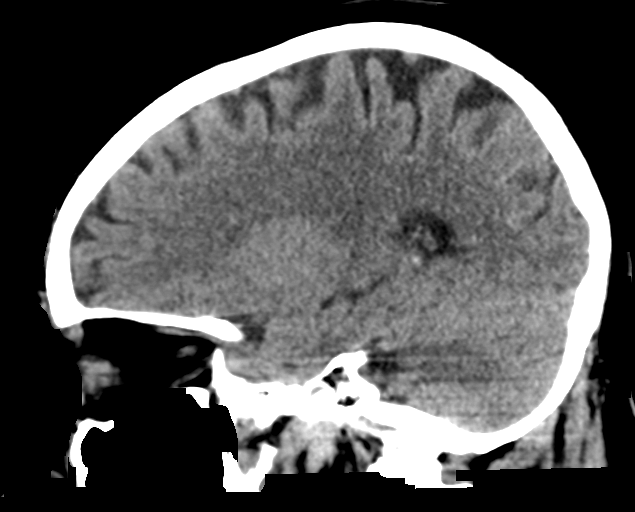
[im 22/44  brain]
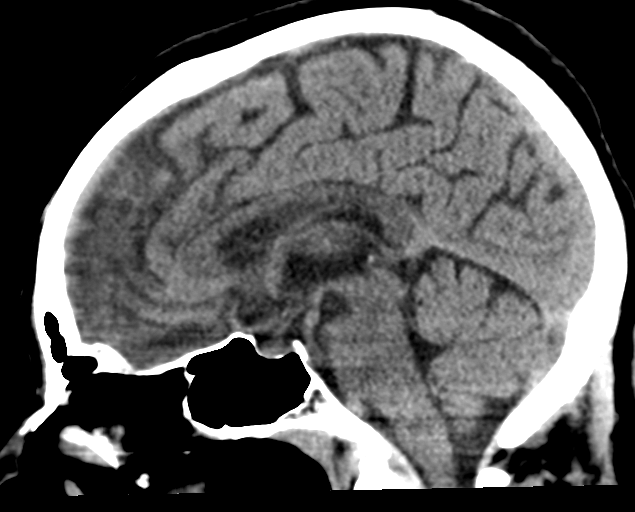
[im 29/44  brain]
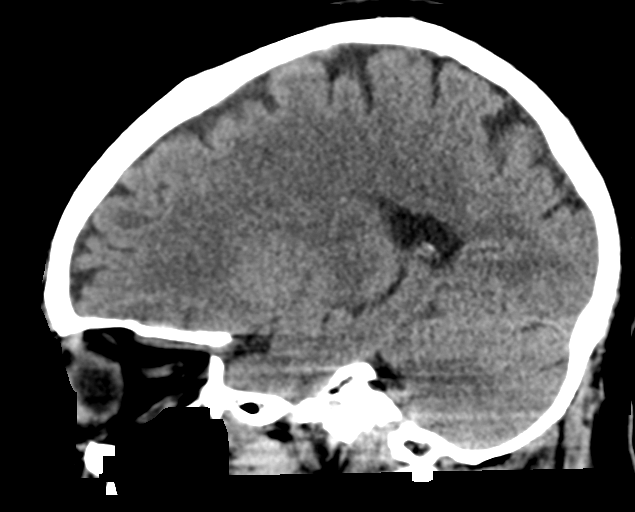

[15 of 46 positions shown; findings below may reference images not displayed]

FINDINGS: CT HEAD FINDINGS

Brain: No evidence of acute infarction, hemorrhage, hydrocephalus,
extra-axial collection or mass lesion/mass effect. Chronic appearing
low-density at the right anterior capsule and caudate head.

Vascular: No hyperdense vessel or unexpected calcification.

Skull: No acute fracture. Stable deformity at the bilateral
mandibular neck.

Sinuses/Orbits: No acute finding.

CT CERVICAL SPINE FINDINGS

Alignment: No traumatic malalignment

Skull base and vertebrae: No acute fracture. No primary bone lesion
or focal pathologic process. C5-6 and C6-7 ACDF with solid
arthrodesis.

Soft tissues and spinal canal: No prevertebral fluid or swelling. No
visible canal hematoma. Known left parotid mass evaluated by neck CT
earlier this year

Disc levels: Advanced disc degeneration at C2-3 to C4-5. C5-6 and
C6-7 ACDF.

Upper chest: Biapical emphysema
IMPRESSION: No evidence of acute intracranial or cervical spine injury.

## 2023-01-12 IMAGING — CR DG KNEE COMPLETE 4+V*R*
4 series · 4 of 4 positions shown · non-contrast
Comparison: None Available.

CLINICAL DATA: Fall.  Knee pain.

EXAM:
RIGHT KNEE - COMPLETE 4+ VIEW

[x knee ap right]
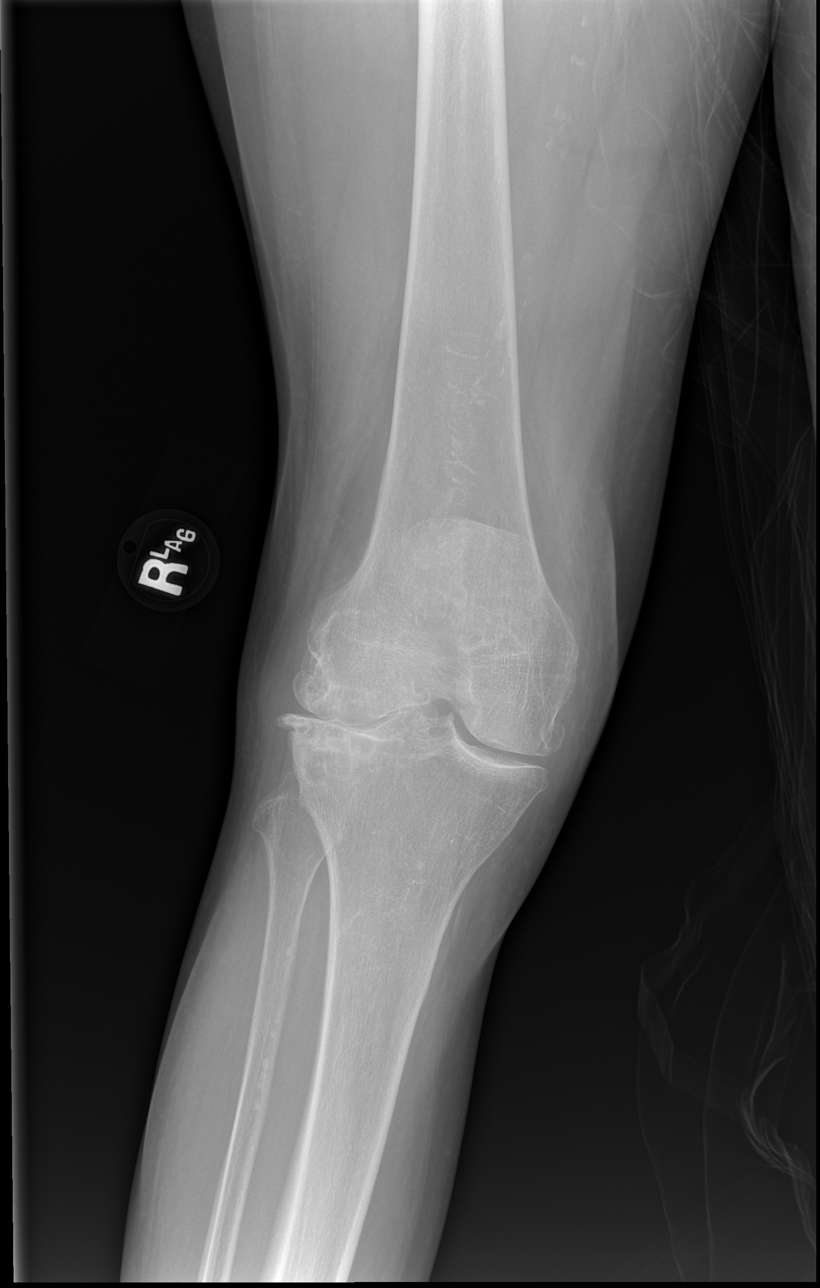

[x knee obl right (1 of 2)]
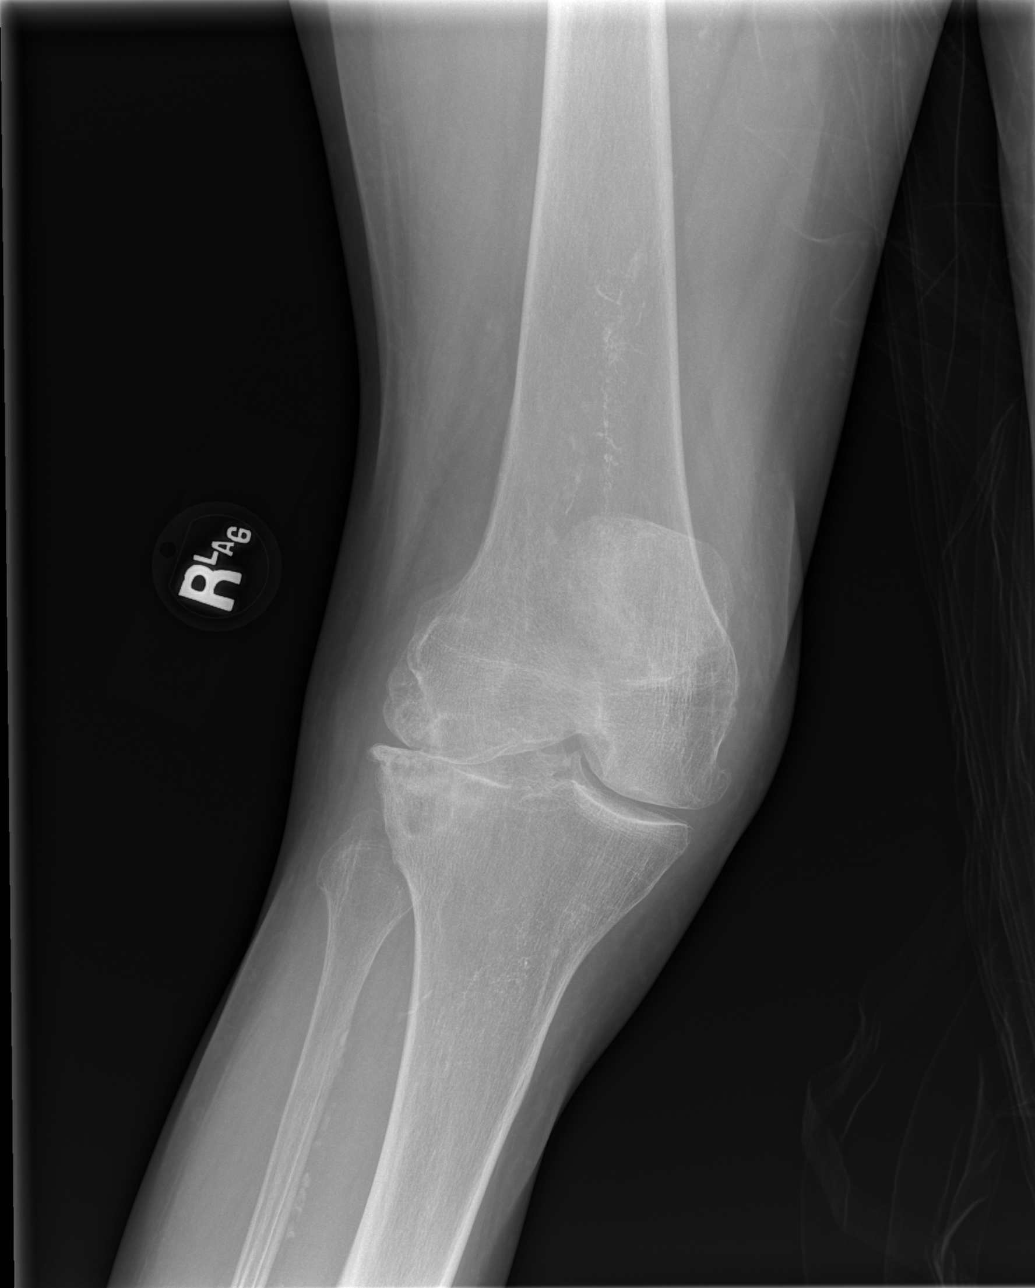

[x knee obl right (2 of 2)]
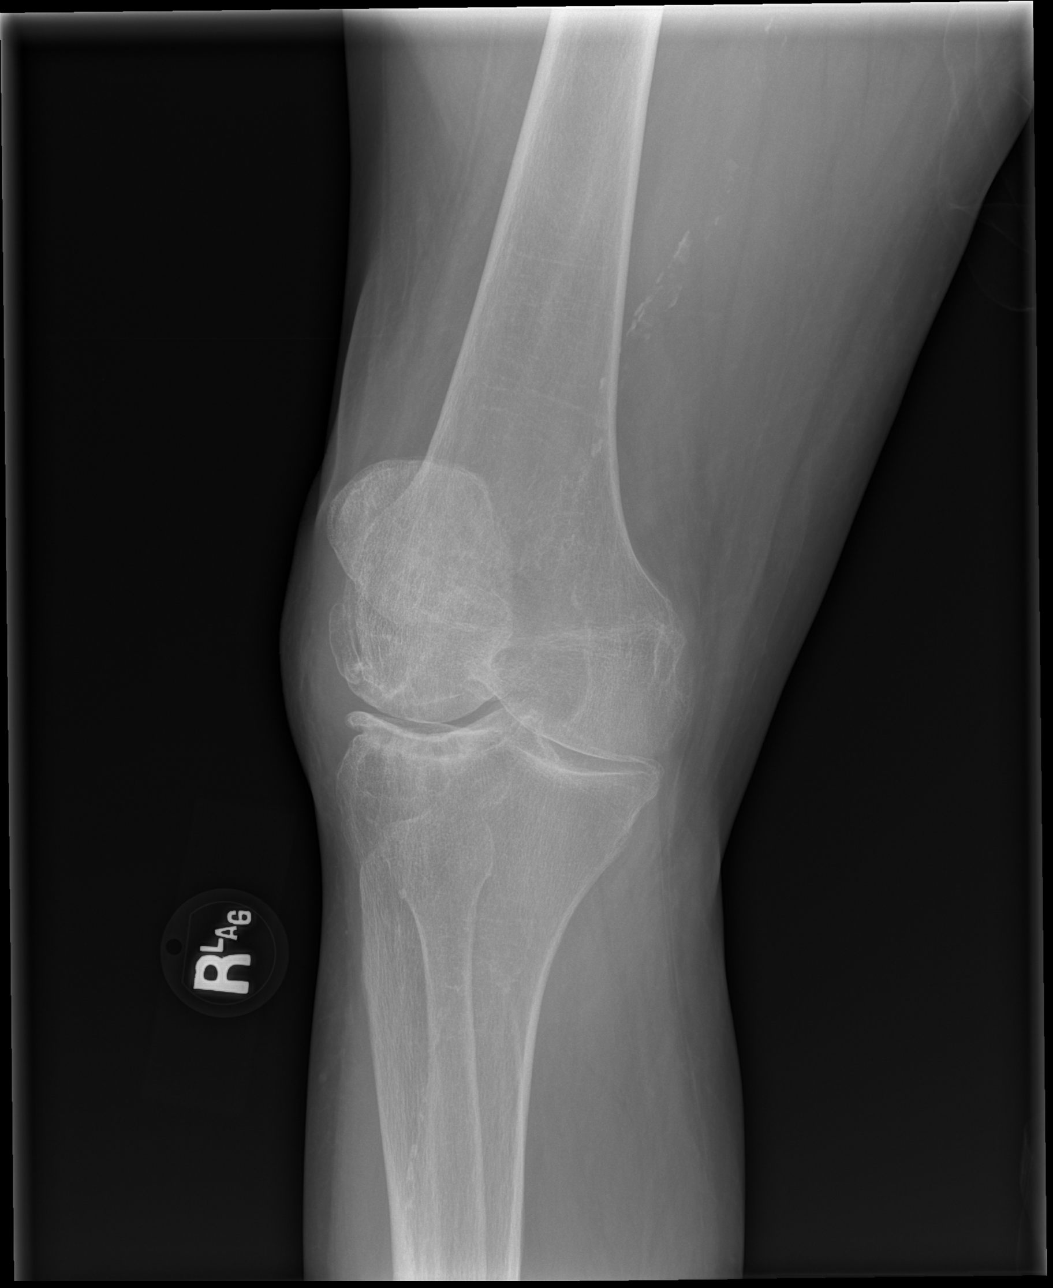

[x knee lat right]
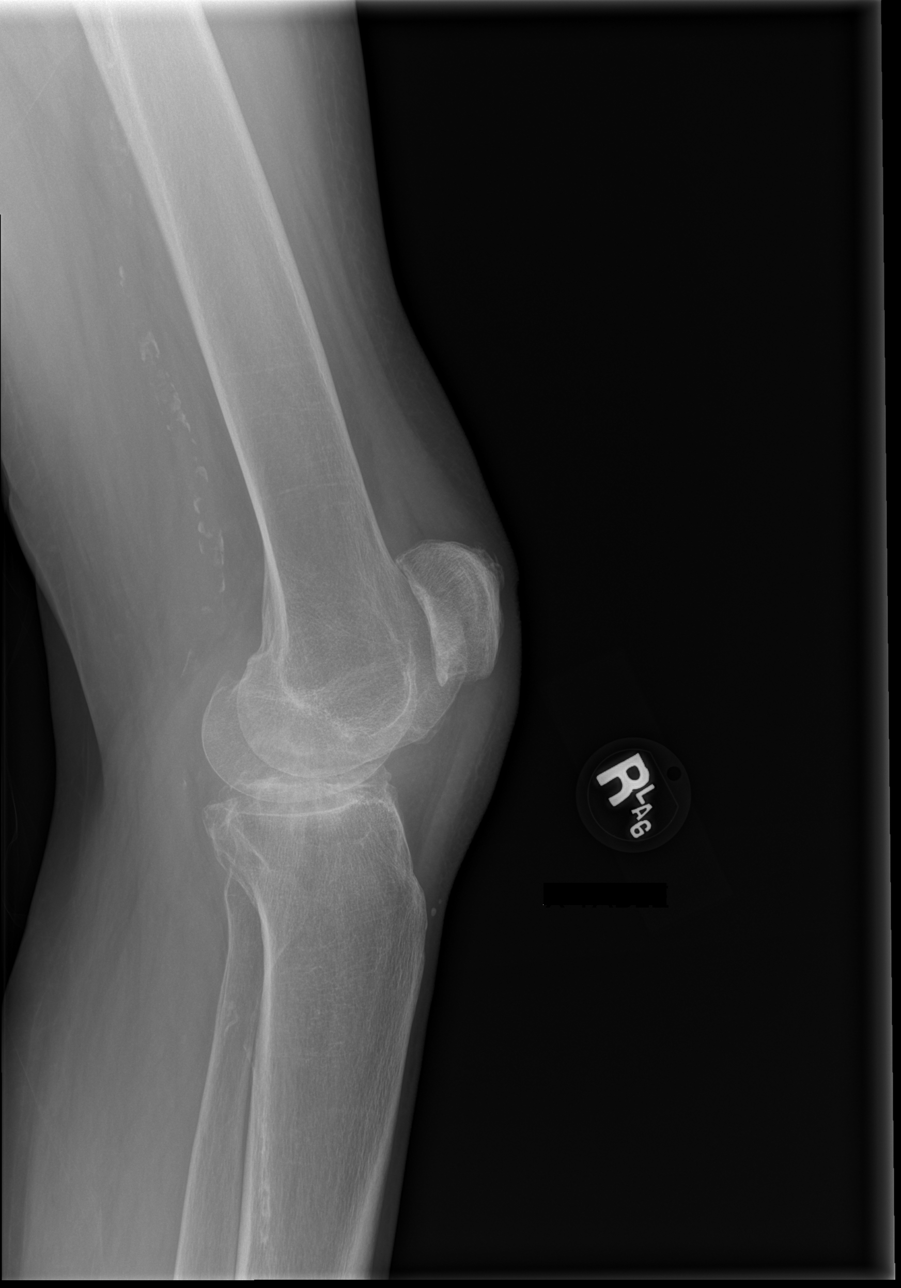

[4 of 4 positions shown; findings below may reference images not displayed]

FINDINGS: No evidence for an acute fracture. No subluxation or dislocation.
Tiny joint effusion evident. Marked loss of joint space noted
lateral compartment with prominent hypertrophic spurring in all 3
compartments. Probable degenerative subchondral cyst formation
lateral tibial plateau.
IMPRESSION: 1. No acute bony abnormality. Tiny joint effusion.
2. Tricompartmental degenerative changes.

## 2023-01-12 IMAGING — CR DG CHEST 1V
1 series · 1 of 1 positions shown · non-contrast
Comparison: 06/01/2021

CLINICAL DATA: Fall.

EXAM:
CHEST  1 VIEW

[x chest ap]
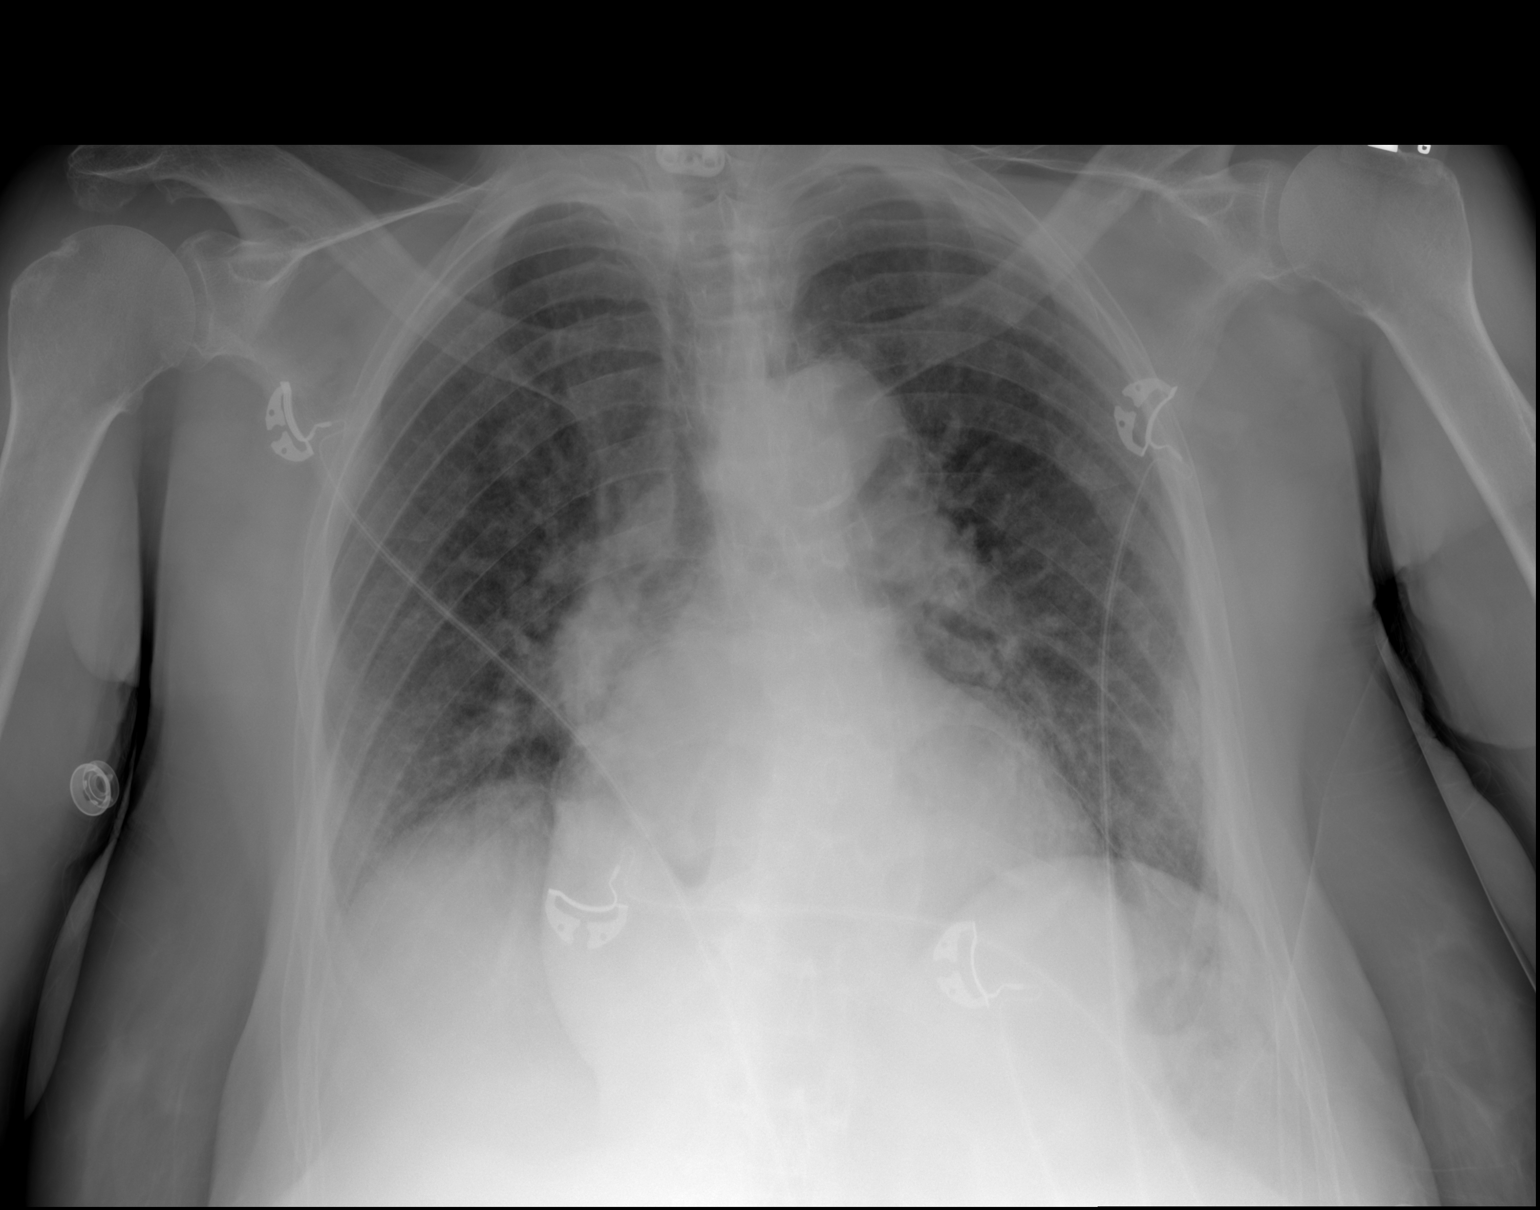

[1 of 1 positions shown; findings below may reference images not displayed]

FINDINGS: 3668 hours. Low volume film. The cardio pericardial silhouette is
enlarged. There is pulmonary vascular congestion and a component of
interstitial pulmonary edema not excluded. The visualized bony
structures of the thorax are unremarkable. Telemetry leads overlie
the chest.
IMPRESSION: Enlarged cardiopericardial silhouette with pulmonary vascular
congestion and possible interstitial pulmonary edema

## 2023-01-12 IMAGING — MR MR CERVICAL SPINE W/O CM
5 series · 38 of 48 positions shown · non-contrast
Comparison: 04/02/2021

CLINICAL DATA: Myelopathy, acute, cervical spine; technologist note
states recent fall hitting back of head with pain

EXAM:
MRI CERVICAL SPINE WITHOUT CONTRAST
TECHNIQUE: Multiplanar, multisequence MR imaging of the cervical spine was
performed. No intravenous contrast was administered.

[Series 5: T1 · sagittal · 3.0mm · 0.69mm/px · 8 of 15 slices shown]
[im 1/15]
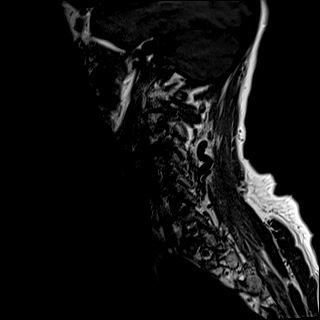
[im 3/15]
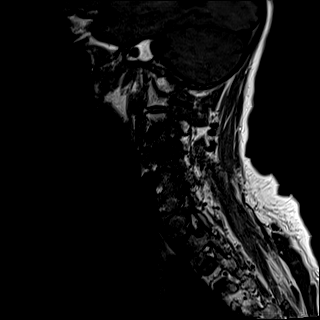
[im 5/15]
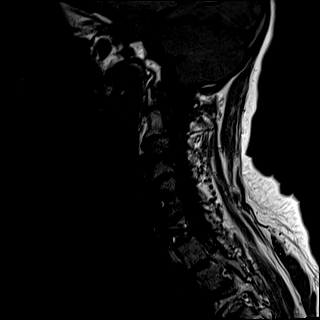
[im 7/15]
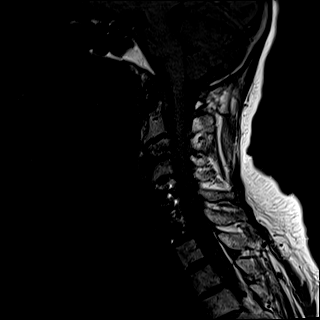
[im 9/15]
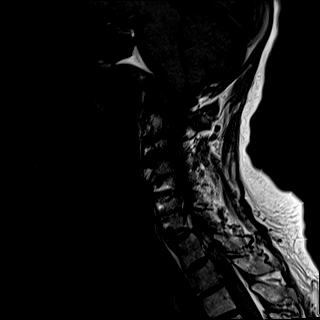
[im 11/15]
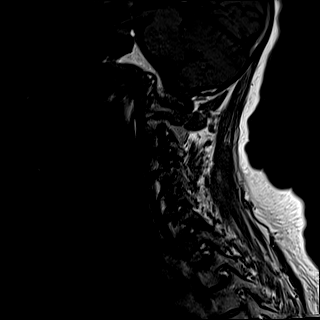
[im 13/15]
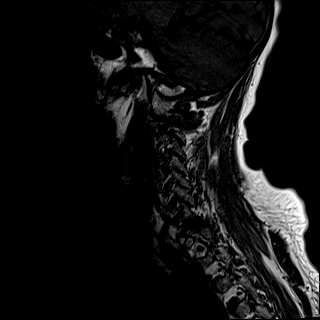
[im 15/15]
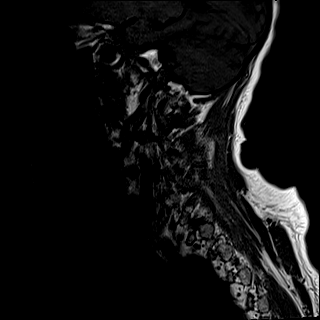

[Series 6: T2 · sagittal · 3.0mm · 0.69mm/px · 7 of 15 slices shown (1 of 2)]
[im 1/15]
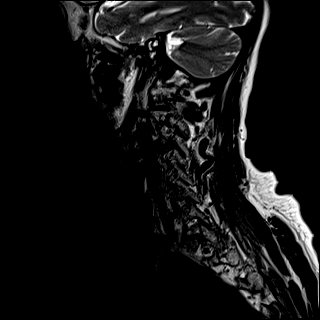
[im 3/15]
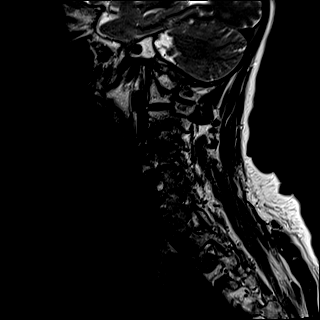
[im 5/15]
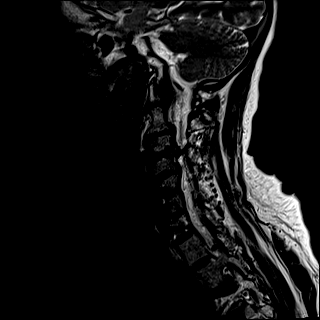
[im 8/15]
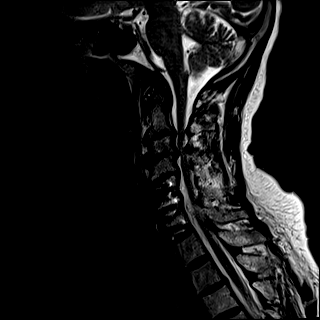
[im 10/15]
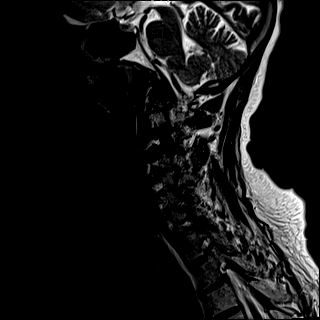
[im 12/15]
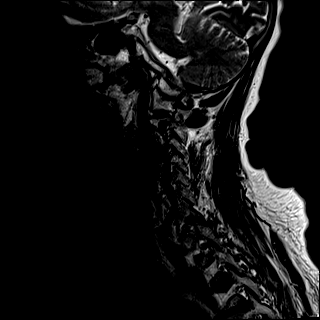
[im 15/15]
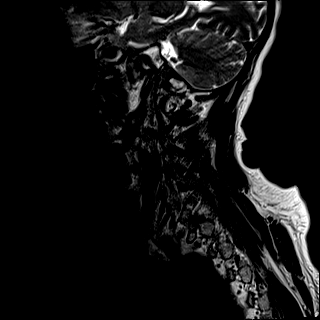

[Series 7: STIR · sagittal · 3.0mm · 0.86mm/px · 7 of 15 slices shown]
[im 1/15]
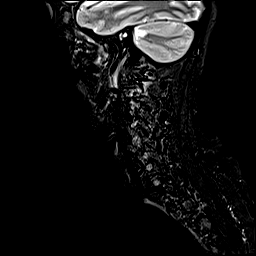
[im 3/15]
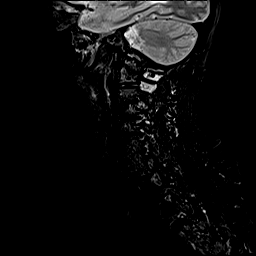
[im 5/15]
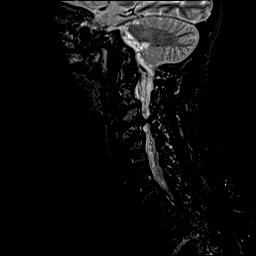
[im 8/15]
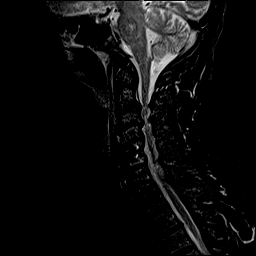
[im 10/15]
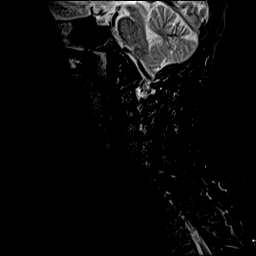
[im 12/15]
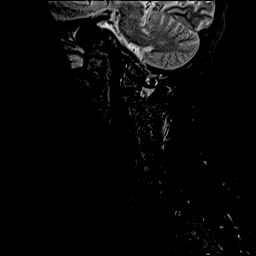
[im 15/15]
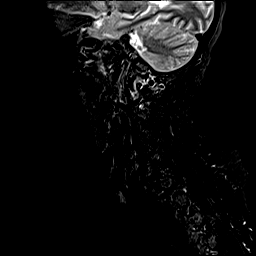

[Series 8: T2 · axial · 3.0mm · 0.70mm/px · z∈[-52,+35]mm · 9 of 27 slices shown (2 of 2)]
[im 1/27]
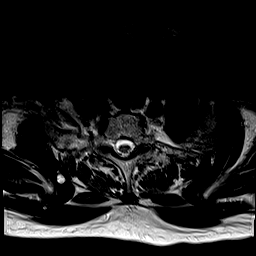
[im 5/27]
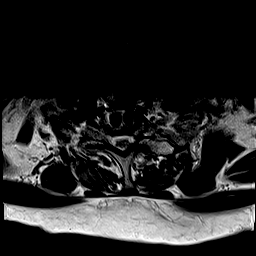
[im 9/27]
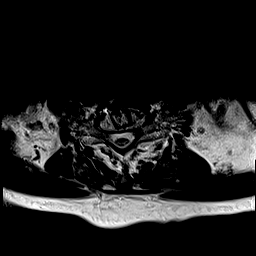
[im 11/27]
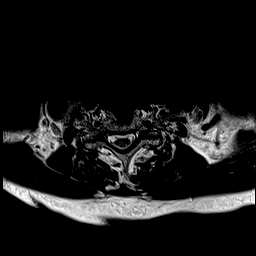
[im 14/27]
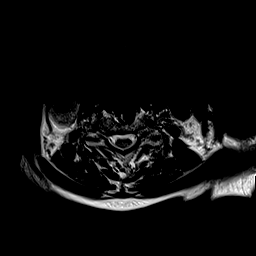
[im 16/27]
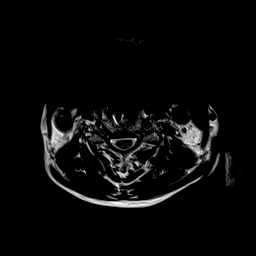
[im 18/27]
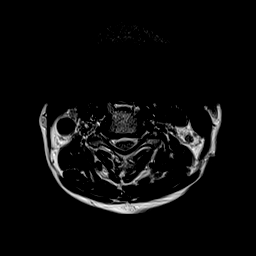
[im 22/27]
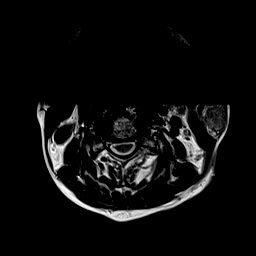
[im 27/27]
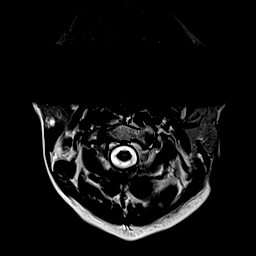

[Series 9: GRE · axial · 3.0mm · 0.35mm/px · z∈[-52,+19]mm · 7 of 27 slices shown]
[im 1/27]
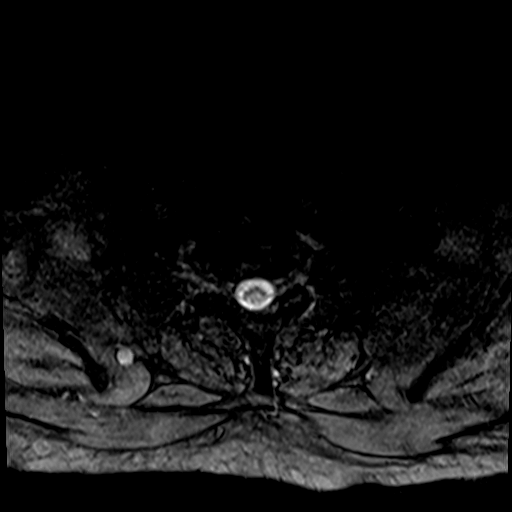
[im 5/27]
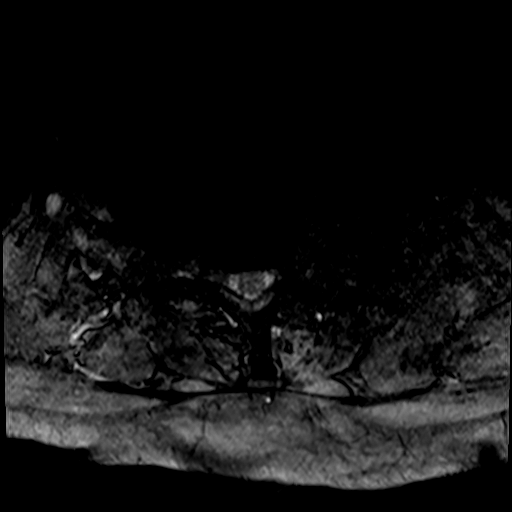
[im 9/27]
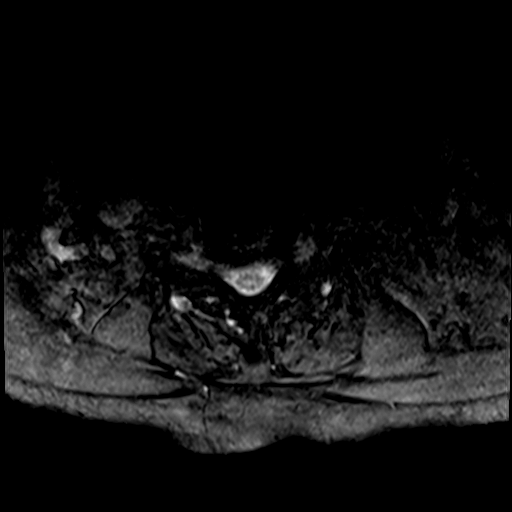
[im 11/27]
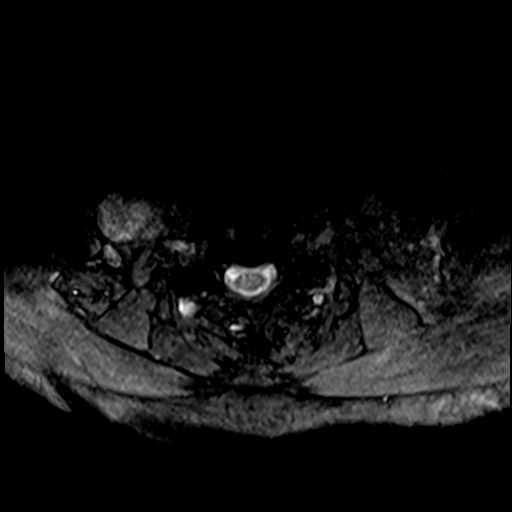
[im 16/27]
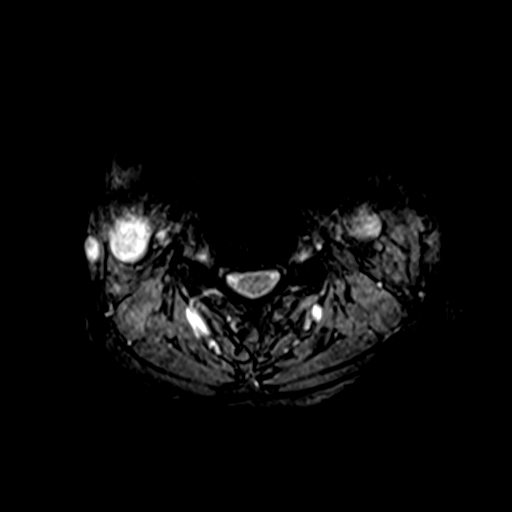
[im 18/27]
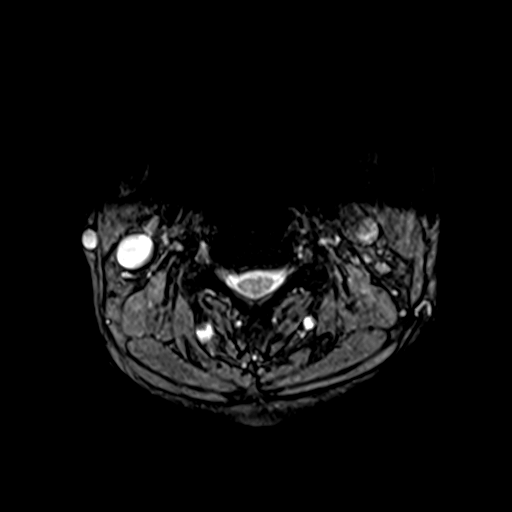
[im 22/27]
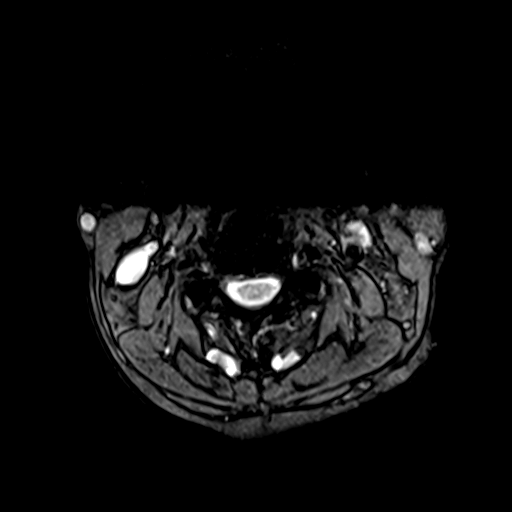

[38 of 48 positions shown; findings below may reference images not displayed]

FINDINGS: Alignment: Stable including retrolisthesis at C3-C4.

Vertebrae: Stable vertebral body heights. Degenerative plate
irregularity above the operative levels. Post ACDF at C5-C7.
Hardware is better evaluated on prior CT. No substantial marrow
edema.

Cord: Subtle cord T2 hyperintensity at C3-C4 as present previously.
Previous abnormal signal dorsally at C4 and centrally at C6-C7 not
as well seen.

Posterior Fossa, vertebral arteries, paraspinal tissues: Left
parotid mass as seen on multiple prior studies. Otherwise
unremarkable.

Disc levels:

C2-C3: Disc bulge with endplate osteophytes. Uncovertebral greater
than facet hypertrophy. Thickening of the ligamentum flavum. Marked
canal stenosis. Moderate foraminal stenosis.

C3-C4: Disc osteophyte complex slightly eccentric to the right.
Uncovertebral greater than facet hypertrophy. Ligamentum flavum
thickening. Marked canal stenosis with cord compression. Moderate
foraminal stenosis.

C4-C5: Disc bulge with endplate osteophytes. Uncovertebral greater
than facet hypertrophy. Mild canal stenosis. Moderate to marked
foraminal stenosis.

C5-C6: Operative level. Bridging bone with endplate osteophytes and
facet hypertrophy. No canal stenosis. Moderate right foraminal
stenosis. No left foraminal stenosis.

C6-C7: Operative level. Bridging bone and endplate osteophytes. No
canal or foraminal stenosis.

C7-T1:  Facet hypertrophy.  No canal or foraminal stenosis.
IMPRESSION: Multilevel degenerative changes as detailed above similar to the
prior study. As before, there is marked canal stenosis at C2-C3 and
C3-C4 with cord compression at C3-C4. Persistent abnormal signal at
C3-C4 reflecting edema or myelomalacia. Foraminal narrowing remains
greatest at C4-C5.

No evidence of ligamentous or soft tissue injury.

## 2023-02-15 ENCOUNTER — Ambulatory Visit: Payer: 59 | Attending: Cardiovascular Disease | Admitting: Cardiovascular Disease

## 2023-03-15 ENCOUNTER — Other Ambulatory Visit: Payer: Medicaid Other

## 2023-03-24 ENCOUNTER — Emergency Department (HOSPITAL_COMMUNITY)

## 2023-03-24 ENCOUNTER — Encounter (HOSPITAL_COMMUNITY): Payer: Self-pay

## 2023-03-24 ENCOUNTER — Inpatient Hospital Stay (HOSPITAL_COMMUNITY)
Admission: EM | Admit: 2023-03-24 | Discharge: 2023-03-26 | DRG: 917 | Disposition: A | Discharge status: Home / Self Care | Attending: Family Medicine | Admitting: Family Medicine

## 2023-03-24 ENCOUNTER — Other Ambulatory Visit: Payer: Self-pay

## 2023-03-24 DIAGNOSIS — Z8249 Family history of ischemic heart disease and other diseases of the circulatory system: Secondary | ICD-10-CM | POA: Diagnosis not present

## 2023-03-24 DIAGNOSIS — I129 Hypertensive chronic kidney disease with stage 1 through stage 4 chronic kidney disease, or unspecified chronic kidney disease: Secondary | ICD-10-CM | POA: Diagnosis present

## 2023-03-24 DIAGNOSIS — G928 Other toxic encephalopathy: Secondary | ICD-10-CM | POA: Diagnosis present

## 2023-03-24 DIAGNOSIS — N183 Chronic kidney disease, stage 3 unspecified: Secondary | ICD-10-CM | POA: Diagnosis present

## 2023-03-24 DIAGNOSIS — Z885 Allergy status to narcotic agent status: Secondary | ICD-10-CM

## 2023-03-24 DIAGNOSIS — R0902 Hypoxemia: Secondary | ICD-10-CM | POA: Diagnosis present

## 2023-03-24 DIAGNOSIS — Z79899 Other long term (current) drug therapy: Secondary | ICD-10-CM | POA: Diagnosis not present

## 2023-03-24 DIAGNOSIS — Z882 Allergy status to sulfonamides status: Secondary | ICD-10-CM | POA: Diagnosis not present

## 2023-03-24 DIAGNOSIS — T40605A Adverse effect of unspecified narcotics, initial encounter: Secondary | ICD-10-CM | POA: Diagnosis present

## 2023-03-24 DIAGNOSIS — M069 Rheumatoid arthritis, unspecified: Secondary | ICD-10-CM | POA: Diagnosis present

## 2023-03-24 DIAGNOSIS — R400 Somnolence: Secondary | ICD-10-CM | POA: Diagnosis not present

## 2023-03-24 DIAGNOSIS — F17210 Nicotine dependence, cigarettes, uncomplicated: Secondary | ICD-10-CM | POA: Diagnosis present

## 2023-03-24 DIAGNOSIS — R4182 Altered mental status, unspecified: Secondary | ICD-10-CM

## 2023-03-24 DIAGNOSIS — K219 Gastro-esophageal reflux disease without esophagitis: Secondary | ICD-10-CM | POA: Diagnosis present

## 2023-03-24 DIAGNOSIS — N3090 Cystitis, unspecified without hematuria: Secondary | ICD-10-CM | POA: Diagnosis present

## 2023-03-24 DIAGNOSIS — G8929 Other chronic pain: Secondary | ICD-10-CM | POA: Diagnosis present

## 2023-03-24 DIAGNOSIS — Z825 Family history of asthma and other chronic lower respiratory diseases: Secondary | ICD-10-CM | POA: Diagnosis not present

## 2023-03-24 DIAGNOSIS — J449 Chronic obstructive pulmonary disease, unspecified: Secondary | ICD-10-CM | POA: Diagnosis present

## 2023-03-24 DIAGNOSIS — Z888 Allergy status to other drugs, medicaments and biological substances status: Secondary | ICD-10-CM

## 2023-03-24 DIAGNOSIS — F1410 Cocaine abuse, uncomplicated: Secondary | ICD-10-CM | POA: Diagnosis present

## 2023-03-24 DIAGNOSIS — R739 Hyperglycemia, unspecified: Secondary | ICD-10-CM | POA: Diagnosis present

## 2023-03-24 DIAGNOSIS — Z87442 Personal history of urinary calculi: Secondary | ICD-10-CM

## 2023-03-24 DIAGNOSIS — Z90710 Acquired absence of both cervix and uterus: Secondary | ICD-10-CM

## 2023-03-24 DIAGNOSIS — T50911A Poisoning by multiple unspecified drugs, medicaments and biological substances, accidental (unintentional), initial encounter: Principal | ICD-10-CM | POA: Diagnosis present

## 2023-03-24 DIAGNOSIS — Z881 Allergy status to other antibiotic agents status: Secondary | ICD-10-CM

## 2023-03-24 DIAGNOSIS — J690 Pneumonitis due to inhalation of food and vomit: Secondary | ICD-10-CM

## 2023-03-24 DIAGNOSIS — E162 Hypoglycemia, unspecified: Secondary | ICD-10-CM | POA: Diagnosis present

## 2023-03-24 DIAGNOSIS — F1490 Cocaine use, unspecified, uncomplicated: Secondary | ICD-10-CM | POA: Diagnosis not present

## 2023-03-24 DIAGNOSIS — J189 Pneumonia, unspecified organism: Secondary | ICD-10-CM

## 2023-03-24 LAB — COMPREHENSIVE METABOLIC PANEL
ALT: 11 U/L (ref 0–44)
AST: 16 U/L (ref 15–41)
Albumin: 3.1 g/dL — ABNORMAL LOW (ref 3.5–5.0)
Alkaline Phosphatase: 81 U/L (ref 38–126)
Anion gap: 9 (ref 5–15)
BUN: 14 mg/dL (ref 8–23)
CO2: 19 mmol/L — ABNORMAL LOW (ref 22–32)
Calcium: 8.7 mg/dL — ABNORMAL LOW (ref 8.9–10.3)
Chloride: 113 mmol/L — ABNORMAL HIGH (ref 98–111)
Creatinine, Ser: 1.18 mg/dL — ABNORMAL HIGH (ref 0.44–1.00)
GFR, Estimated: 50 mL/min — ABNORMAL LOW (ref >60)
Glucose, Bld: 104 mg/dL — ABNORMAL HIGH (ref 70–99)
Potassium: 3.1 mmol/L — ABNORMAL LOW (ref 3.5–5.1)
Sodium: 141 mmol/L (ref 135–145)
Total Bilirubin: 0.6 mg/dL (ref 0.0–1.2)
Total Protein: 7.1 g/dL (ref 6.5–8.1)

## 2023-03-24 LAB — RAPID URINE DRUG SCREEN, HOSP PERFORMED
Amphetamines: NOT DETECTED
Barbiturates: NOT DETECTED
Benzodiazepines: NOT DETECTED
Cocaine: POSITIVE — AB
Opiates: NOT DETECTED
Tetrahydrocannabinol: NOT DETECTED

## 2023-03-24 LAB — CBC WITH DIFFERENTIAL/PLATELET
Abs Immature Granulocytes: 0.01 10*3/uL (ref 0.00–0.07)
Basophils Absolute: 0 10*3/uL (ref 0.0–0.1)
Basophils Relative: 1 %
Eosinophils Absolute: 0.3 10*3/uL (ref 0.0–0.5)
Eosinophils Relative: 5 %
HCT: 33.3 % — ABNORMAL LOW (ref 36.0–46.0)
Hemoglobin: 10.6 g/dL — ABNORMAL LOW (ref 12.0–15.0)
Immature Granulocytes: 0 %
Lymphocytes Relative: 36 %
Lymphs Abs: 2 10*3/uL (ref 0.7–4.0)
MCH: 27.2 pg (ref 26.0–34.0)
MCHC: 31.8 g/dL (ref 30.0–36.0)
MCV: 85.6 fL (ref 80.0–100.0)
Monocytes Absolute: 0.4 10*3/uL (ref 0.1–1.0)
Monocytes Relative: 8 %
Neutro Abs: 2.9 10*3/uL (ref 1.7–7.7)
Neutrophils Relative %: 50 %
Platelets: 361 10*3/uL (ref 150–400)
RBC: 3.89 MIL/uL (ref 3.87–5.11)
RDW: 15.8 % — ABNORMAL HIGH (ref 11.5–15.5)
WBC: 5.6 10*3/uL (ref 4.0–10.5)
nRBC: 0 % (ref 0.0–0.2)

## 2023-03-24 LAB — URINALYSIS, ROUTINE W REFLEX MICROSCOPIC
Bilirubin Urine: NEGATIVE
Glucose, UA: 50 mg/dL — AB
Ketones, ur: NEGATIVE mg/dL
Nitrite: NEGATIVE
Protein, ur: 30 mg/dL — AB
Specific Gravity, Urine: 1.016 (ref 1.005–1.030)
pH: 5 (ref 5.0–8.0)

## 2023-03-24 LAB — BLOOD GAS, VENOUS
Acid-base deficit: 5.2 mmol/L — ABNORMAL HIGH (ref 0.0–2.0)
Bicarbonate: 21.2 mmol/L (ref 20.0–28.0)
O2 Saturation: 82.2 %
Patient temperature: 37
pCO2, Ven: 44 mmHg (ref 44–60)
pH, Ven: 7.29 (ref 7.25–7.43)
pO2, Ven: 52 mmHg — ABNORMAL HIGH (ref 32–45)

## 2023-03-24 LAB — ACETAMINOPHEN LEVEL: Acetaminophen (Tylenol), Serum: <10 ug/mL — ABNORMAL LOW (ref 10–30)

## 2023-03-24 LAB — VITAMIN B12: Vitamin B-12: 265 pg/mL (ref 180–914)

## 2023-03-24 LAB — CBG MONITORING, ED
Glucose-Capillary: 45 mg/dL — ABNORMAL LOW (ref 70–99)
Glucose-Capillary: 166 mg/dL — ABNORMAL HIGH (ref 70–99)
Glucose-Capillary: 60 mg/dL — ABNORMAL LOW (ref 70–99)
Glucose-Capillary: 132 mg/dL — ABNORMAL HIGH (ref 70–99)
Glucose-Capillary: 64 mg/dL — ABNORMAL LOW (ref 70–99)

## 2023-03-24 LAB — AMMONIA: Ammonia: 40 umol/L — ABNORMAL HIGH (ref 9–35)

## 2023-03-24 LAB — TSH: TSH: 0.399 u[IU]/mL (ref 0.350–4.500)

## 2023-03-24 LAB — ETHANOL: Alcohol, Ethyl (B): <10 mg/dL (ref <10)

## 2023-03-24 LAB — SALICYLATE LEVEL: Salicylate Lvl: <7 mg/dL — ABNORMAL LOW (ref 7.0–30.0)

## 2023-03-24 LAB — FOLATE: Folate: 8.5 ng/mL (ref >5.9)

## 2023-03-24 MED ORDER — POLYETHYLENE GLYCOL 3350 17 G PO PACK: 17.0000 g | PACK | Freq: Every day | ORAL | Status: DC | PRN

## 2023-03-24 MED ORDER — NALOXONE HCL 0.4 MG/ML IJ SOLN: 0.4000 mg | INTRAMUSCULAR | Status: AC

## 2023-03-24 MED ORDER — DEXTROSE 50 % IV SOLN
INTRAVENOUS | Status: AC
  Administered 2023-03-24: 50 mL via INTRAVENOUS
  Filled 2023-03-24: qty 50

## 2023-03-24 MED ORDER — DEXTROSE 50 % IV SOLN
INTRAVENOUS | Status: AC
  Filled 2023-03-24: qty 50

## 2023-03-24 MED ORDER — NALOXONE HCL 2 MG/2ML IJ SOSY
2.0000 mg | PREFILLED_SYRINGE | INTRAMUSCULAR | Status: AC
  Administered 2023-03-24: 2 mg via INTRAVENOUS

## 2023-03-24 MED ORDER — NALOXONE HCL 0.4 MG/ML IJ SOLN
INTRAMUSCULAR | Status: AC
  Filled 2023-03-24: qty 1

## 2023-03-24 MED ORDER — SODIUM CHLORIDE 0.9 % IV SOLN
3.0000 g | Freq: Four times a day (QID) | INTRAVENOUS | Status: DC
  Administered 2023-03-25 – 2023-03-26 (×7): 3 g via INTRAVENOUS
  Filled 2023-03-24 (×9): qty 8

## 2023-03-24 MED ORDER — DEXTROSE IN LACTATED RINGERS 5 % IV SOLN: INTRAVENOUS | Status: DC

## 2023-03-24 MED ORDER — SODIUM CHLORIDE 0.9 % IV SOLN
3.0000 g | Freq: Once | INTRAVENOUS | Status: AC
  Administered 2023-03-24: 3 g via INTRAVENOUS
  Filled 2023-03-24: qty 8

## 2023-03-24 MED ORDER — POTASSIUM CHLORIDE 10 MEQ/100ML IV SOLN
10.0000 meq | INTRAVENOUS | Status: AC
  Administered 2023-03-24 – 2023-03-25 (×3): 10 meq via INTRAVENOUS
  Filled 2023-03-24 (×3): qty 100

## 2023-03-24 MED ORDER — NALOXONE HCL 2 MG/2ML IJ SOSY
1.0000 mg | PREFILLED_SYRINGE | Freq: Once | INTRAMUSCULAR | Status: DC
  Filled 2023-03-24: qty 2

## 2023-03-24 MED ORDER — DEXTROSE 50 % IV SOLN: 1.0000 | Freq: Once | INTRAVENOUS | Status: AC

## 2023-03-24 MED ORDER — DEXTROSE 50 % IV SOLN
25.0000 g | INTRAVENOUS | Status: AC
  Administered 2023-03-24: 25 g via INTRAVENOUS
  Filled 2023-03-24: qty 50

## 2023-03-24 MED ORDER — PANTOPRAZOLE SODIUM 40 MG IV SOLR
40.0000 mg | INTRAVENOUS | Status: DC
  Administered 2023-03-24 – 2023-03-25 (×2): 40 mg via INTRAVENOUS
  Filled 2023-03-24 (×2): qty 10

## 2023-03-24 MED ORDER — NALOXONE HCL 0.4 MG/ML IJ SOLN
0.4000 mg | Freq: Once | INTRAMUSCULAR | Status: AC
  Administered 2023-03-24: 0.4 mg via INTRAVENOUS
  Filled 2023-03-24: qty 1

## 2023-03-24 MED ORDER — LACTATED RINGERS IV BOLUS
1000.0000 mL | Freq: Once | INTRAVENOUS | Status: AC
  Administered 2023-03-24: 1000 mL via INTRAVENOUS

## 2023-03-24 MED ORDER — SODIUM CHLORIDE 0.9% FLUSH
3.0000 mL | Freq: Two times a day (BID) | INTRAVENOUS | Status: DC
  Administered 2023-03-24 – 2023-03-26 (×3): 3 mL via INTRAVENOUS

## 2023-03-24 MED ORDER — ACETAMINOPHEN 650 MG RE SUPP: 650.0000 mg | Freq: Four times a day (QID) | RECTAL | Status: DC | PRN

## 2023-03-24 MED ORDER — NALOXONE HCL 0.4 MG/ML IJ SOLN: 0.4000 mg | Freq: Once | INTRAMUSCULAR | Status: DC

## 2023-03-24 MED ORDER — NALOXONE HCL 4 MG/10ML IJ SOLN
0.5000 mg/h | INTRAVENOUS | Status: DC
  Filled 2023-03-24: qty 10

## 2023-03-24 MED ORDER — NALOXONE HCL 0.4 MG/ML IJ SOLN
0.4000 mg | Freq: Once | INTRAMUSCULAR | Status: AC
  Administered 2023-03-24: 0.4 mg via INTRAVENOUS

## 2023-03-24 MED ORDER — ACETAMINOPHEN 325 MG PO TABS
650.0000 mg | ORAL_TABLET | Freq: Four times a day (QID) | ORAL | Status: DC | PRN
  Filled 2023-03-24 (×2): qty 2

## 2023-03-24 MED ORDER — DEXTROSE 50 % IV SOLN
1.0000 | Freq: Once | INTRAVENOUS | Status: AC
  Administered 2023-03-24: 50 mL via INTRAVENOUS

## 2023-03-24 MED ORDER — NALOXONE HCL 0.4 MG/ML IJ SOLN
INTRAMUSCULAR | Status: AC
  Administered 2023-03-24: 0.4 mg via INTRAVENOUS
  Filled 2023-03-24: qty 1

## 2023-03-24 NOTE — ED Provider Notes (Signed)
 Patient signed to me by Dr. Earle Gell pending results of scans.  Urinalysis shows infection.  Chest x-ray shows possible atypical infection.  Due to patient's history of substance abuse concern for possible early aspiration pneumonia.  Will start on IV Unasyn for this.  Patient had hyperglycemia and will recheck blood sugar.  Patient requires admission and while consult hospitalist service   Lorre Nick, MD 03/24/23 1731

## 2023-03-24 NOTE — Assessment & Plan Note (Signed)
 Per report, patient AMS improved with dextrose infusion. However, currenlty POCT glucose is 65 and aptient still Ams. Will c.w. dextose infusion and monitor POCT glucose.  There is no history of patient being diabetic or being hypoglycemic in the past.

## 2023-03-24 NOTE — ED Triage Notes (Signed)
 Patient BIB [facility] from [facility]. Patient has been altered for about 45 minutes. Patient only alert to painful stimuli. Pinpoint pupils on arrival. When sternal rubbed patient said "I did not overdose."
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
## 2023-03-24 NOTE — Assessment & Plan Note (Signed)
 This is manifesting as some marked somnolence.  Patient has received IV Narcan and dextrose with only inconsistent response in terms of improvement of her mental status.  Her ammonia is marginally elevated without any associated LFT abnormalities.  Her head CT is not actionable.  Her exam is nonfocal.  There is no evidence of fever or white count or marked systemic infection.  Patient does use cocaine.  However her U tox is negative for opiates.  Patient has previously had episodes of altered mental status that have been attributed to metabolic encephalopathy.  At this time I feel that the patient having marked AMS.  With no apparent cause.  I will go ahead and proceed and get an EEG.  Patient seems vitally too unstable I think at this time to proceed with an MRI given that her respiratory rate is varying between 6 and 12.  And there was at least 1 apneic episode.  I will transfer the patient to progressive care unit at [facility].  And see if our neurology consultants can help [place] out in this regard.

## 2023-03-24 NOTE — Assessment & Plan Note (Signed)
 Diagnosed by ER attendign. Startd on unasyn. Finish course.

## 2023-03-24 NOTE — Progress Notes (Signed)
 Pharmacy Antibiotic Note  [name] is a 68 y.o. female admitted on 03/24/2023 with aspiration pneumonia. CXR showing mildly prominent interstitial markings bilaterally, possible infection. Pharmacy has been consulted for Unasyn dosing.  Patient received on dose of Unasyn 3g IV on 3/13 @1746 .  Plan: Start Unasyn 3g IV q6hrs  Monitor renal function and overall clinical improvement  De-escalate abx as able   Height: 5' 6.5" (168.9 cm) Weight: 82 kg (180 lb 12.4 oz) IBW/kg (Calculated) : 60.45  Temp (24hrs), Avg:97.7 F (36.5 C), Min:97.7 F (36.5 C), Max:97.7 F (36.5 C)  Recent Labs  Lab 03/24/23 1335  WBC 5.6  CREATININE 1.18*    Estimated Creatinine Clearance: 49.1 mL/min (A) (by C-G formula based on SCr of 1.18 mg/dL (H)).    Allergies  Allergen Reactions   Iodine-131 Anaphylaxis     Neck tightens up   Iohexol Anaphylaxis   Buprenorphine Other (See Comments)   Levofloxacin Other (See Comments)    BASELINE PROLONGED QTc.     Sulfamethoxazole-Trimethoprim Other (See Comments)   Zofran [Ondansetron Hcl] Other (See Comments)    BASELINE PROLONGED QTc.   Heparin Itching and Other (See Comments)    Pt is able to tolerate IV heparin, but not sub-Q.   Morphine And Codeine Itching    Antimicrobials this admission: 3/13 Unasyn >>   Dose adjustments this admission: N/A  Microbiology results: None   Thank you for allowing pharmacy to be a part of this patient's care.  Cherylin Mylar, PharmD Clinical Pharmacist  3/13/20258:42 PM

## 2023-03-25 DIAGNOSIS — F1490 Cocaine use, unspecified, uncomplicated: Secondary | ICD-10-CM | POA: Diagnosis not present

## 2023-03-25 DIAGNOSIS — E162 Hypoglycemia, unspecified: Secondary | ICD-10-CM | POA: Diagnosis not present

## 2023-03-25 DIAGNOSIS — J690 Pneumonitis due to inhalation of food and vomit: Secondary | ICD-10-CM | POA: Diagnosis not present

## 2023-03-25 DIAGNOSIS — R400 Somnolence: Secondary | ICD-10-CM | POA: Diagnosis not present

## 2023-03-25 LAB — BASIC METABOLIC PANEL
Anion gap: 10 (ref 5–15)
BUN: 8 mg/dL (ref 8–23)
CO2: 17 mmol/L — ABNORMAL LOW (ref 22–32)
Calcium: 8.4 mg/dL — ABNORMAL LOW (ref 8.9–10.3)
Chloride: 116 mmol/L — ABNORMAL HIGH (ref 98–111)
Creatinine, Ser: 0.87 mg/dL (ref 0.44–1.00)
GFR, Estimated: >60 mL/min (ref >60)
Glucose, Bld: 91 mg/dL (ref 70–99)
Potassium: 3.4 mmol/L — ABNORMAL LOW (ref 3.5–5.1)
Sodium: 143 mmol/L (ref 135–145)

## 2023-03-25 LAB — TROPONIN I (HIGH SENSITIVITY)
Troponin I (High Sensitivity): 22 ng/L — ABNORMAL HIGH (ref <18)
Troponin I (High Sensitivity): 21 ng/L — ABNORMAL HIGH (ref <18)

## 2023-03-25 LAB — GLUCOSE, CAPILLARY
Glucose-Capillary: 95 mg/dL (ref 70–99)
Glucose-Capillary: 119 mg/dL — ABNORMAL HIGH (ref 70–99)
Glucose-Capillary: 108 mg/dL — ABNORMAL HIGH (ref 70–99)

## 2023-03-25 LAB — CBC
HCT: 36.5 % (ref 36.0–46.0)
Hemoglobin: 11.2 g/dL — ABNORMAL LOW (ref 12.0–15.0)
MCH: 26.6 pg (ref 26.0–34.0)
MCHC: 30.7 g/dL (ref 30.0–36.0)
MCV: 86.7 fL (ref 80.0–100.0)
Platelets: 354 10*3/uL (ref 150–400)
RBC: 4.21 MIL/uL (ref 3.87–5.11)
RDW: 15.9 % — ABNORMAL HIGH (ref 11.5–15.5)
WBC: 5.7 10*3/uL (ref 4.0–10.5)
nRBC: 0 % (ref 0.0–0.2)

## 2023-03-25 LAB — PROTIME-INR
INR: 1.4 — ABNORMAL HIGH (ref 0.8–1.2)
Prothrombin Time: 17.7 s — ABNORMAL HIGH (ref 11.4–15.2)

## 2023-03-25 LAB — T4, FREE: Free T4: 1.01 ng/dL (ref 0.61–1.12)

## 2023-03-25 LAB — RPR: RPR Ser Ql: NONREACTIVE

## 2023-03-25 LAB — CBG MONITORING, ED
Glucose-Capillary: 76 mg/dL (ref 70–99)
Glucose-Capillary: 91 mg/dL (ref 70–99)

## 2023-03-25 LAB — HIV ANTIBODY (ROUTINE TESTING W REFLEX): HIV Screen 4th Generation wRfx: NONREACTIVE

## 2023-03-25 LAB — APTT: aPTT: 26 s (ref 24–36)

## 2023-03-25 MED ORDER — GABAPENTIN 100 MG PO CAPS
200.0000 mg | ORAL_CAPSULE | Freq: Two times a day (BID) | ORAL | Status: DC | PRN
  Administered 2023-03-25 (×2): 200 mg via ORAL
  Filled 2023-03-25 (×2): qty 2

## 2023-03-25 MED ORDER — THIAMINE HCL 100 MG/ML IJ SOLN: 100.0000 mg | Freq: Every day | INTRAMUSCULAR | Status: DC

## 2023-03-25 MED ORDER — THIAMINE HCL 100 MG/ML IJ SOLN: 250.0000 mg | Freq: Every day | INTRAVENOUS | Status: DC

## 2023-03-25 MED ORDER — VITAMIN B-12 1000 MCG PO TABS
1000.0000 ug | ORAL_TABLET | Freq: Every day | ORAL | Status: DC
  Administered 2023-03-25 – 2023-03-26 (×2): 1000 ug via ORAL
  Filled 2023-03-25 (×2): qty 1

## 2023-03-25 MED ORDER — CYANOCOBALAMIN 1000 MCG/ML IJ SOLN
1000.0000 ug | Freq: Once | INTRAMUSCULAR | Status: AC
  Administered 2023-03-25: 1000 ug via INTRAMUSCULAR
  Filled 2023-03-25: qty 1

## 2023-03-25 MED ORDER — DICLOFENAC SODIUM 1 % EX GEL
2.0000 g | Freq: Four times a day (QID) | CUTANEOUS | Status: DC | PRN
  Administered 2023-03-25: 2 g via TOPICAL
  Filled 2023-03-25: qty 100

## 2023-03-25 MED ORDER — POTASSIUM CHLORIDE CRYS ER 20 MEQ PO TBCR
40.0000 meq | EXTENDED_RELEASE_TABLET | Freq: Two times a day (BID) | ORAL | Status: AC
  Administered 2023-03-25 (×2): 40 meq via ORAL
  Filled 2023-03-25 (×2): qty 2

## 2023-03-25 MED ORDER — THIAMINE HCL 100 MG/ML IJ SOLN
500.0000 mg | Freq: Three times a day (TID) | INTRAVENOUS | Status: DC
  Administered 2023-03-25 – 2023-03-26 (×5): 500 mg via INTRAVENOUS
  Filled 2023-03-25 (×4): qty 5
  Filled 2023-03-25: qty 3
  Filled 2023-03-25: qty 5

## 2023-03-25 MED ORDER — IBUPROFEN 400 MG PO TABS
400.0000 mg | ORAL_TABLET | Freq: Four times a day (QID) | ORAL | Status: DC | PRN
  Administered 2023-03-25 – 2023-03-26 (×3): 400 mg via ORAL
  Filled 2023-03-25 (×2): qty 1
  Filled 2023-03-25: qty 2

## 2023-03-25 NOTE — ED Notes (Addendum)
 Unasyn paused due to patient c/o her IV hurting. IV removed at this time.

## 2023-03-25 NOTE — ED Notes (Signed)
 Patient resting in bed breathing eyes closed

## 2023-03-25 NOTE — Progress Notes (Signed)
 PHARMACY NOTE -  Unasyn  Pharmacy has been assisting with dosing of Unasyn for aspiration pneumonia. Dosage remains stable at 3g IV q6 hr and further renal adjustments per institutional Pharmacy antibiotic protocol  Pharmacy will sign off, following peripherally for culture results, dose adjustments, and length of therapy. Please reconsult if a change in clinical status warrants re-evaluation of dosage.  Bernadene Person, PharmD, BCPS [phone] 03/25/2023, 10:34 AM
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
## 2023-03-25 NOTE — ED Notes (Signed)
 Patient assisted to use the bedpan and new brief applied to patient at this time due to patient brief being wet with urine. Patient speaking with nurse at this time.

## 2023-03-25 NOTE — Progress Notes (Signed)
Patient refusing CPAP for tonight. 

## 2023-03-25 NOTE — Progress Notes (Signed)
 Pt BP 182/99. Provider notified and no new orders at this time. Pt a&ox4, rise and fall of chest observed and care continues.

## 2023-03-25 NOTE — Progress Notes (Signed)
 Pt states she is allergic to tylenol. She reports being "itchy" after taking it.

## 2023-03-25 NOTE — Plan of Care (Signed)

## 2023-03-25 NOTE — Progress Notes (Signed)
 Pt admitted to the unit. A&ox4 and BP 172/90. Complains of 10/10 joint pain. Provider notified and Voltaren gel ordered.
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
## 2023-03-25 NOTE — ED Notes (Signed)
 Provided patient with sand which and beverage

## 2023-03-26 ENCOUNTER — Other Ambulatory Visit (HOSPITAL_COMMUNITY): Payer: Self-pay

## 2023-03-26 DIAGNOSIS — R400 Somnolence: Secondary | ICD-10-CM | POA: Diagnosis not present

## 2023-03-26 LAB — CBC
HCT: 36.6 % (ref 36.0–46.0)
Hemoglobin: 11.5 g/dL — ABNORMAL LOW (ref 12.0–15.0)
MCH: 26.8 pg (ref 26.0–34.0)
MCHC: 31.4 g/dL (ref 30.0–36.0)
MCV: 85.3 fL (ref 80.0–100.0)
Platelets: 353 10*3/uL (ref 150–400)
RBC: 4.29 MIL/uL (ref 3.87–5.11)
RDW: 15.5 % (ref 11.5–15.5)
WBC: 5.3 10*3/uL (ref 4.0–10.5)
nRBC: 0 % (ref 0.0–0.2)

## 2023-03-26 LAB — COMPREHENSIVE METABOLIC PANEL
ALT: 8 U/L (ref 0–44)
AST: 15 U/L (ref 15–41)
Albumin: 3.1 g/dL — ABNORMAL LOW (ref 3.5–5.0)
Alkaline Phosphatase: 81 U/L (ref 38–126)
Anion gap: 8 (ref 5–15)
BUN: 7 mg/dL — ABNORMAL LOW (ref 8–23)
CO2: 21 mmol/L — ABNORMAL LOW (ref 22–32)
Calcium: 8.5 mg/dL — ABNORMAL LOW (ref 8.9–10.3)
Chloride: 109 mmol/L (ref 98–111)
Creatinine, Ser: 0.96 mg/dL (ref 0.44–1.00)
GFR, Estimated: >60 mL/min (ref >60)
Glucose, Bld: 102 mg/dL — ABNORMAL HIGH (ref 70–99)
Potassium: 3.7 mmol/L (ref 3.5–5.1)
Sodium: 138 mmol/L (ref 135–145)
Total Bilirubin: 0.5 mg/dL (ref 0.0–1.2)
Total Protein: 7 g/dL (ref 6.5–8.1)

## 2023-03-26 LAB — GLUCOSE, CAPILLARY
Glucose-Capillary: 100 mg/dL — ABNORMAL HIGH (ref 70–99)
Glucose-Capillary: 136 mg/dL — ABNORMAL HIGH (ref 70–99)

## 2023-03-26 MED ORDER — PANTOPRAZOLE SODIUM 40 MG PO TBEC
40.0000 mg | DELAYED_RELEASE_TABLET | Freq: Every day | ORAL | Status: DC
Start: 2023-03-26 — End: 2023-03-26

## 2023-03-26 MED ORDER — AMOXICILLIN-POT CLAVULANATE 875-125 MG PO TABS
1.0000 | ORAL_TABLET | Freq: Two times a day (BID) | ORAL | 0 refills | Status: DC
Start: 2023-03-26 — End: 2023-07-05
  Filled 2023-03-26: qty 12, 6d supply, fill #0

## 2023-03-26 NOTE — TOC Initial Note (Signed)
 Transition of Care [facility]) - Initial/Assessment Note    Patient Details  Name: Skylor Hughson MRN: [id number] Date of Birth: [birth year]  Transition of Care [facility]) CM/SW Contact:    Adrian Prows, RN Phone Number: 03/26/2023, 3:17 PM  Clinical Narrative:                 Sherron Monday w/ pt in room; pt says she lives at home; she plans to return at d/c; she identified POC dtr Einar Pheasant [phone]); pt says her sister will provide transportation; she denies SDOH risks; pt says she has cane, walker, wheelchair, and shower chair; she does not have HH services or home oxygen; no TOC needs.  Expected Discharge Plan: Home/Self Care Barriers to Discharge: No Barriers Identified   Patient Goals and CMS Choice Patient states their goals for this hospitalization and ongoing recovery are:: home CMS Medicare.gov Compare Post Acute Care list provided to:: Patient    ownership interest in [facility].provided to:: Patient    Expected Discharge Plan and Services   Discharge Planning Services: CM Consult Post Acute Care Choice: NA Living arrangements for the past 2 months: Apartment Expected Discharge Date: 03/26/23               DME Arranged: N/A DME Agency: NA       HH Arranged: NA HH Agency: NA        Prior Living Arrangements/Services Living arrangements for the past 2 months: Apartment Lives with:: Self Patient language and need for interpreter reviewed:: Yes Do you feel safe going back to the place where you live?: Yes      Need for Family Participation in Patient Care: Yes (Comment) Care giver support system in place?: Yes (comment) Current home services: DME (cane walker, wheelchair, shower chair) Criminal Activity/Legal Involvement Pertinent to Current Situation/Hospitalization: No - Comment as needed  Activities of Daily Living   ADL Screening (condition at time of admission) Independently performs ADLs?: Yes (appropriate for developmental  age) Is the patient deaf or have difficulty hearing?: No Does the patient have difficulty seeing, even when wearing glasses/contacts?: No Does the patient have difficulty concentrating, remembering, or making decisions?: No  Permission Sought/Granted Permission sought to share information with : Case Manager Permission granted to share information with : Yes, Verbal Permission Granted  Share Information with NAME: Case Manager     Permission granted to share info w Relationship: Einar Pheasant (dtr) [phone]     Emotional Assessment Appearance:: Appears stated age Attitude/Demeanor/Rapport: Gracious Affect (typically observed): Accepting Orientation: : Oriented to Self, Oriented to Place, Oriented to  Time, Oriented to Situation Alcohol / Substance Use: Not Applicable Psych Involvement: No (comment)  Admission diagnosis:  Cocaine abuse (HCC) [F14.10] Pneumonia due to infectious organism, unspecified laterality, unspecified part of lung [J18.9] AMS (altered mental status) [R41.82] Patient Active Problem List   Diagnosis Date Noted   Hypoglycemia 03/24/2023   Aspiration pneumonia (HCC) 03/24/2023   Hypoxia 11/08/2022   Hypokalemia 11/05/2022   Acute pain of right shoulder due to trauma 04/25/2022   Cervical myelopathy (HCC) 11/02/2021   Disease of spinal cord, unspecified (HCC) 11/02/2021   Spinal stenosis in cervical region 11/02/2021   AMS (altered mental status) 07/18/2021   B12 deficiency 04/07/2021   Folate deficiency 04/07/2021   Protein-calorie malnutrition, moderate (HCC) 04/06/2021   Contracture of joint, lower leg 04/03/2021   Dupuytren's contracture of left hand 04/03/2021   UTI (urinary tract infection) 04/03/2021   Severe pain 04/03/2021   Intractable  pain 04/02/2021   Pleural effusion 11/12/2020   Dyspnea on exertion 11/11/2020   Perforated appendicitis 11/02/2020   Chronic kidney disease, stage 3a (HCC) 11/02/2020   Colitis 06/23/2018   Coronary artery  calcification 02/23/2017   Closed nondisplaced fracture of lateral malleolus of right fibula 10/28/2016   Noninfectious gastroenteritis    Hematochezia 09/01/2016   Acute diarrhea 08/28/2016   Hydronephrosis, right 06/29/2015   Hyperkalemia 06/29/2015   HCAP (healthcare-associated pneumonia) 06/29/2015   Peripheral neuropathy 06/29/2015   Candida infection, oral 06/28/2015   Normocytic anemia 06/10/2015   Hypoalbuminemia due to protein-calorie malnutrition (HCC) 06/10/2015   Ankle fracture, right 06/03/2015   Severe sepsis (HCC) 05/29/2015   Cervical spinal stenosis 05/12/2015   S/P cervical spinal fusion 04/17/2015   Acute cystitis without hematuria    Vomiting    Constipation 02/17/2015   CAP (community acquired pneumonia) 02/15/2015   Cocaine abuse with cocaine-induced disorder (HCC) 02/14/2015   Sepsis (HCC) 10/28/2014   SIRS (systemic inflammatory response syndrome) (HCC) 10/28/2014   Sore throat 10/28/2014   Acute encephalopathy 10/28/2014   Metabolic acidosis    Leukocytosis    Orthostatic hypotension 09/10/2014   Dehydration 09/10/2014   Chest pain at rest 09/09/2014   Tobacco abuse 09/09/2014   Mixed hyperlipidemia 09/09/2014   COPD (chronic obstructive pulmonary disease) (HCC) 09/09/2014   GERD without esophagitis 09/09/2014   Chest pain 08/20/2014   Nausea & vomiting 08/20/2014   Acute renal failure superimposed on stage 3 chronic kidney disease (HCC) 08/20/2014   Lower urinary tract infectious disease 08/20/2014   Pain in the chest    Pain 02/11/2013   Cocaine abuse (HCC) 02/11/2013   Rheumatoid arthritis flare (HCC) 02/11/2013   Acute renal failure (HCC) 02/11/2013   AKI (acute kidney injury) (HCC) 02/11/2013   Hiatal hernia with gastroesophageal reflux 10/11/2012   Abdominal mass 10/08/2012   Knee pain 10/08/2012   Essential hypertension 10/08/2012   Pancreatic mass 10/07/2012   Abdominal bloating 10/07/2012   Rheumatoid arthritis (HCC)    PCP:  Ellyn Hack, MD Pharmacy:   [facility] - [place], [place] - [address] [place] [address] Phone: [phone] Fax: [phone]  [id number] - [address], [place] - [address] [place] [address] Phone: [phone] Fax: [phone]  [facility] [address] [place] [address] Phone: [phone] Fax: [phone]  [facility] [address] [place] [address] Phone: [phone] Fax: [phone]     Social Drivers of Health (SDOH) Social History: SDOH Screenings   Food Insecurity: No Food Insecurity (03/26/2023)  Housing: Low Risk  (03/26/2023)  Transportation Needs: No Transportation Needs (03/26/2023)  Utilities: Not At Risk (03/26/2023)  Financial Resource Strain: High Risk (06/24/2018)  Social Connections: Moderately Integrated (03/25/2023)  Tobacco Use: High Risk (03/24/2023)   SDOH Interventions: Food Insecurity Interventions: Intervention Not Indicated, Inpatient TOC Housing Interventions: Intervention Not Indicated, Inpatient TOC Transportation Interventions: Intervention Not Indicated, Inpatient TOC Utilities Interventions: Intervention Not Indicated, Inpatient TOC   Readmission Risk Interventions    03/26/2023    3:15 PM 04/05/2021    5:04 PM 11/13/2020   10:26 AM  Readmission Risk Prevention Plan  Transportation Screening Complete Complete Complete  PCP or Specialist Appt within 3-5 Days Complete  Complete  HRI or Home Care Consult Complete  Complete  Social Work Consult for Recovery Care Planning/Counseling Complete  Complete  Palliative  Care Screening Not Applicable  Complete  Medication Review (RN Care Manager) Complete Complete Complete  PCP or Specialist appointment within 3-5 days of discharge  Complete   HRI or Home Care Consult  Complete   SW Recovery Care/Counseling  Consult  Complete   Palliative Care Screening  Complete   Skilled Nursing Facility  Complete

## 2023-03-26 NOTE — Plan of Care (Signed)

## 2023-03-26 NOTE — Discharge Summary (Signed)
 Physician Discharge Summary   Patient: [name] MRN: [id number] DOB: Jan 05, 1956  Admit date:     03/24/2023  Discharge date: 03/26/23  Discharge Physician: Alberteen Sam   PCP: Ellyn Hack, MD     Recommendations at discharge:  Follow up with PCP Dr. Sherryll Burger for likely inadvertent opiate overdose and aspiration pneumonia Reduce opiate and/or gabapentin dosing for safety     Discharge Diagnoses: Principal Problem:   Acute toxic encephalopathy due to opiates, baclofen Active Problems:   Aspiration pneumonia (HCC)   Polysubstance abuse   Hypoglycemia   COPD   Rheumatoid arthritis   Hypertension       Hospital Course: 68 y.o. F with chronic pain on high dose daily opiates, baclofen and high dose gabapentin, COPD, HTN and RA who presented with being found unresponsive.  Upon arrival to the ER, noted to have pinpoint pupils.  UDS positive for cocaine.  Mentation improved with Narcan.  Neurology consulted and evaluated- originally planning MRI brain but she returned to mental baseline without focal deficits.   Acute toxic and metabolic encephalopathy, likely due to opiates, cocaine, hypoglycemia, aspiration Patient was observed overnight, her vital signs were normal, her mentation remained at baseline.  Her oral intake was good, and she was asymptomatic relative to her baseline, discharged home with close PCP follow-up.  Aspiration pneumonia Noted to have infiltrates on chest x-ray in the setting of being found unresponsive.  Discharged on 5 days of oral antibiotics for aspiration pneumonia             The [facility] Controlled Substances Registry was reviewed for this patient prior to discharge.   Consultants: Neurology   Disposition: Home Diet recommendation:  Discharge Diet Orders (From admission, onward)     Start     Ordered   03/26/23 0000  Diet - low sodium heart healthy        03/26/23 1433             DISCHARGE  MEDICATION: Allergies as of 03/26/2023       Reactions   Iodine-131 Anaphylaxis   Neck tightens up   Iohexol Anaphylaxis   Buprenorphine Other (See Comments)   Levofloxacin Other (See Comments)   BASELINE PROLONGED QTc.     Sulfamethoxazole-trimethoprim Other (See Comments)   Zofran [ondansetron Hcl] Other (See Comments)   BASELINE PROLONGED QTc.   Heparin Itching, Other (See Comments)   Pt is able to tolerate IV heparin, but not sub-Q.   Morphine And Codeine Itching        Medication List     TAKE these medications    albuterol 108 (90 Base) MCG/ACT inhaler Commonly known as: VENTOLIN HFA Inhale 1-2 puffs into the lungs every 6 (six) hours as needed for wheezing or shortness of breath.   amitriptyline 50 MG tablet Commonly known as: ELAVIL Take 50 mg by mouth at bedtime.   amLODipine 10 MG tablet Commonly known as: NORVASC Take 10 mg by mouth daily.   amoxicillin-clavulanate 875-125 MG tablet Commonly known as: AUGMENTIN Take 1 tablet by mouth 2 (two) times daily.   Anoro Ellipta 62.5-25 MCG/ACT Aepb Generic drug: umeclidinium-vilanterol Inhale 1 puff into the lungs daily.   atorvastatin 40 MG tablet Commonly known as: LIPITOR Take 1 tablet (40 mg total) by mouth daily at 6 PM.   baclofen 10 MG tablet Commonly known as: LIORESAL Take 10 mg by mouth 2 (two) times daily.   carvedilol 6.25 MG tablet Commonly known as: COREG  Take 6.25 mg by mouth 2 (two) times daily.   diclofenac Sodium 1 % Gel Commonly known as: VOLTAREN Apply 2 g topically 4 (four) times daily as needed (joint pain).   DULoxetine 60 MG capsule Commonly known as: CYMBALTA Take 60 mg by mouth daily.   gabapentin 800 MG tablet Commonly known as: NEURONTIN Take 800 mg by mouth 3 (three) times daily.   ibuprofen 800 MG tablet Commonly known as: ADVIL Take 800 mg by mouth every 8 (eight) hours as needed for moderate pain (pain score 4-6).   linaclotide 145 MCG Caps capsule Commonly  known as: LINZESS Take 145 mcg by mouth every other day.   naloxone 4 MG/0.1ML Liqd nasal spray kit Commonly known as: NARCAN Place 1 spray into the nose once. As needed for overdose   oxyCODONE-acetaminophen 10-325 MG tablet Commonly known as: PERCOCET Take 1 tablet by mouth every 6 (six) hours as needed.   pantoprazole 40 MG tablet Commonly known as: PROTONIX Take 40 mg by mouth daily.   senna-docusate 8.6-50 MG tablet Commonly known as: Senokot-S Take 2 tablets by mouth at bedtime as needed for moderate constipation.   Trelegy Ellipta 100-62.5-25 MCG/ACT Aepb Generic drug: Fluticasone-Umeclidin-Vilant Take 1 puff by mouth every morning.        Follow-up Information     Ellyn Hack, MD. Schedule an appointment as soon as possible for a visit in 1 week(s).   Specialty: Family Medicine Contact information: [address] [place] [address] [phone]                 Discharge Instructions     Diet - low sodium heart healthy   Complete by: As directed    Discharge instructions   Complete by: As directed    **IMPORTANT DISCHARGE INSTRUCTIONS**   From Dr. Maryfrances Bunnell: You were admitted for being unresponsive  Here, we did a CT scan of the head that showed no new findings.  We also checked for heart, kidney, and liver function and these were all normal.  Lung function also looked normal.    The most common thing that causes sleepiness like that is medications, and we suspect this happened from too much oxycodone.  Take oxycodone ONLY as prescribed, do not increase doses  Lastly, while you were passed out, you had an aspiration and some vomit went into your lungs  Take antibiotics for this for 6 more days   Increase activity slowly   Complete by: As directed        Discharge Exam: Filed Weights   03/24/23 1256  Weight: 82 kg    General: Pt is alert, awake, not in acute distress Cardiovascular: RRR, nl S1-S2, no murmurs appreciated.   No  LE edema.   Respiratory: Normal respiratory rate and rhythm.  CTAB without rales or wheezes. Abdominal: Abdomen soft and non-tender.  No distension or HSM.   Neuro/Psych: Strength symmetric in upper and lower extremities.  Judgment and insight appear normal.   Condition at discharge: fair  The results of significant diagnostics from this hospitalization (including imaging, microbiology, ancillary and laboratory) are listed below for reference.   Imaging Studies: CT Head Wo Contrast Result Date: 03/24/2023 CLINICAL DATA:  Provided history: Delirium. EXAM: CT HEAD WITHOUT CONTRAST TECHNIQUE: Contiguous axial images were obtained from the base of the skull through the vertex without intravenous contrast. RADIATION DOSE REDUCTION: This exam was performed according to the departmental dose-optimization program which includes automated exposure control, adjustment of the mA  and/or kV according to patient size and/or use of iterative reconstruction technique. COMPARISON:  Head CT 12/21/2022.  Brain MRI 04/02/2021. FINDINGS: Brain: Generalized cerebral atrophy. Patchy and ill-defined hypoattenuation within the cerebral white matter, nonspecific but compatible with mild chronic small vessel ischemic disease. There is no acute intracranial hemorrhage. No demarcated cortical infarct. No extra-axial fluid collection. No evidence of an intracranial mass. No midline shift. Vascular: No hyperdense vessel. Atherosclerotic calcifications. Skull: No calvarial fracture or aggressive osseous lesion. Sinuses/Orbits: No mass or acute finding within the imaged orbits. No significant paranasal sinus disease at the imaged levels. IMPRESSION: 1. No evidence of an acute intracranial abnormality. 2. Mild chronic small vessel ischemic changes within the cerebral white matter. 3. Cerebral atrophy. Electronically Signed   By: Jackey Loge D.O.   On: 03/24/2023 16:42   DG Chest Portable 1 View Result Date: 03/24/2023 CLINICAL DATA:   Dyspnea EXAM: PORTABLE CHEST 1 VIEW COMPARISON:  11/11/2022 FINDINGS: Stable cardiomegaly. Aortic atherosclerosis. Retrocardiac opacity compatible with known large hiatal hernia. Mildly prominent interstitial markings bilaterally, most pronounced in the left perihilar region. No pleural effusion or pneumothorax. IMPRESSION: Mildly prominent interstitial markings bilaterally, most pronounced in the left perihilar region. Findings may reflect mild edema versus atypical/viral infection. Electronically Signed   By: Duanne Guess D.O.   On: 03/24/2023 16:23    Microbiology: Results for orders placed or performed during the hospital encounter of 11/04/22  Urine Culture     Status: Abnormal   Collection Time: 11/04/22  2:55 AM   Specimen: Urine, Clean Catch  Result Value Ref Range Status   Specimen Description   Final    URINE, CLEAN CATCH Performed at [facility], [address] [place], [place] [address]    Special Requests   Final    NONE Performed at [facility], [address], [place], [place] [address]    Culture >=100,000 COLONIES/mL ESCHERICHIA COLI (A)  Final   Report Status 11/06/2022 FINAL  Final   Organism ID, Bacteria ESCHERICHIA COLI (A)  Final      Susceptibility   Escherichia coli - MIC*    AMPICILLIN >=32 RESISTANT Resistant     CEFAZOLIN <=4 SENSITIVE Sensitive     CEFEPIME <=0.12 SENSITIVE Sensitive     CEFTRIAXONE <=0.25 SENSITIVE Sensitive     CIPROFLOXACIN >=4 RESISTANT Resistant     GENTAMICIN <=1 SENSITIVE Sensitive     IMIPENEM <=0.25 SENSITIVE Sensitive     NITROFURANTOIN <=16 SENSITIVE Sensitive     TRIMETH/SULFA >=320 RESISTANT Resistant     AMPICILLIN/SULBACTAM 8 SENSITIVE Sensitive     PIP/TAZO <=4 SENSITIVE Sensitive ug/mL    * >=100,000 COLONIES/mL ESCHERICHIA COLI    Labs: CBC: Recent Labs  Lab 03/24/23 1335 03/25/23 0832 03/26/23 0912  WBC 5.6 5.7 5.3  NEUTROABS 2.9  --   --   HGB 10.6* 11.2* 11.5*   HCT 33.3* 36.5 36.6  MCV 85.6 86.7 85.3  PLT 361 354 353   Basic Metabolic Panel: Recent Labs  Lab 03/24/23 1335 03/25/23 0654 03/26/23 0912  NA 141 143 138  K 3.1* 3.4* 3.7  CL 113* 116* 109  CO2 19* 17* 21*  GLUCOSE 104* 91 102*  BUN 14 8 7*  CREATININE 1.18* 0.87 0.96  CALCIUM 8.7* 8.4* 8.5*   Liver Function Tests: Recent Labs  Lab 03/24/23 1335 03/26/23 0912  AST 16 15  ALT 11 8  ALKPHOS 81 81  BILITOT 0.6 0.5  PROT 7.1 7.0  ALBUMIN  3.1* 3.1*   CBG: Recent Labs  Lab 03/25/23 1312 03/25/23 1649 03/25/23 2142 03/26/23 0741 03/26/23 1151  GLUCAP 95 119* 108* 100* 136*    Discharge time spent: approximately 45 minutes spent on discharge counseling, evaluation of patient on day of discharge, and coordination of discharge planning with nursing, social work, pharmacy and case management  Signed: Alberteen Sam, MD [facility] 03/26/2023

## 2023-03-26 NOTE — Progress Notes (Signed)
 Patient discharged home, IV removed, discharge paperwork explained and provided, patient verbalized understanding, awaiting ordered medications to be delivered from the outpatient pharmacy.

## 2023-04-27 ENCOUNTER — Emergency Department (HOSPITAL_COMMUNITY)
Admission: EM | Admit: 2023-04-27 | Discharge: 2023-04-27 | Disposition: A | Discharge status: Home / Self Care | Attending: Emergency Medicine | Admitting: Emergency Medicine

## 2023-04-27 ENCOUNTER — Encounter (HOSPITAL_COMMUNITY): Payer: Self-pay

## 2023-04-27 ENCOUNTER — Other Ambulatory Visit: Payer: Self-pay

## 2023-04-27 DIAGNOSIS — Z7951 Long term (current) use of inhaled steroids: Secondary | ICD-10-CM | POA: Insufficient documentation

## 2023-04-27 DIAGNOSIS — N3000 Acute cystitis without hematuria: Secondary | ICD-10-CM | POA: Insufficient documentation

## 2023-04-27 DIAGNOSIS — J449 Chronic obstructive pulmonary disease, unspecified: Secondary | ICD-10-CM | POA: Diagnosis not present

## 2023-04-27 DIAGNOSIS — R300 Dysuria: Secondary | ICD-10-CM | POA: Diagnosis present

## 2023-04-27 LAB — CBC WITH DIFFERENTIAL/PLATELET
Abs Immature Granulocytes: 0.02 10*3/uL (ref 0.00–0.07)
Basophils Absolute: 0 10*3/uL (ref 0.0–0.1)
Basophils Relative: 0 %
Eosinophils Absolute: 0.4 10*3/uL (ref 0.0–0.5)
Eosinophils Relative: 5 %
HCT: 32.7 % — ABNORMAL LOW (ref 36.0–46.0)
Hemoglobin: 10.1 g/dL — ABNORMAL LOW (ref 12.0–15.0)
Immature Granulocytes: 0 %
Lymphocytes Relative: 30 %
Lymphs Abs: 2.2 10*3/uL (ref 0.7–4.0)
MCH: 26.8 pg (ref 26.0–34.0)
MCHC: 30.9 g/dL (ref 30.0–36.0)
MCV: 86.7 fL (ref 80.0–100.0)
Monocytes Absolute: 0.5 10*3/uL (ref 0.1–1.0)
Monocytes Relative: 7 %
Neutro Abs: 4.2 10*3/uL (ref 1.7–7.7)
Neutrophils Relative %: 58 %
Platelets: 314 10*3/uL (ref 150–400)
RBC: 3.77 MIL/uL — ABNORMAL LOW (ref 3.87–5.11)
RDW: 15.2 % (ref 11.5–15.5)
WBC: 7.3 10*3/uL (ref 4.0–10.5)
nRBC: 0 % (ref 0.0–0.2)

## 2023-04-27 LAB — URINALYSIS, ROUTINE W REFLEX MICROSCOPIC
Bilirubin Urine: NEGATIVE
Glucose, UA: NEGATIVE mg/dL
Ketones, ur: NEGATIVE mg/dL
Nitrite: NEGATIVE
Protein, ur: 30 mg/dL — AB
Specific Gravity, Urine: 1.014 (ref 1.005–1.030)
WBC, UA: >50 WBC/hpf (ref 0–5)
pH: 6 (ref 5.0–8.0)

## 2023-04-27 LAB — BASIC METABOLIC PANEL WITH GFR
Anion gap: 8 (ref 5–15)
BUN: 10 mg/dL (ref 8–23)
CO2: 20 mmol/L — ABNORMAL LOW (ref 22–32)
Calcium: 7.9 mg/dL — ABNORMAL LOW (ref 8.9–10.3)
Chloride: 111 mmol/L (ref 98–111)
Creatinine, Ser: 0.94 mg/dL (ref 0.44–1.00)
GFR, Estimated: >60 mL/min (ref >60)
Glucose, Bld: 109 mg/dL — ABNORMAL HIGH (ref 70–99)
Potassium: 2.8 mmol/L — ABNORMAL LOW (ref 3.5–5.1)
Sodium: 139 mmol/L (ref 135–145)

## 2023-04-27 MED ORDER — HYDROMORPHONE HCL 1 MG/ML IJ SOLN
0.5000 mg | Freq: Once | INTRAMUSCULAR | Status: AC
  Administered 2023-04-27: 0.5 mg via INTRAVENOUS
  Filled 2023-04-27: qty 1

## 2023-04-27 MED ORDER — CEPHALEXIN 500 MG PO CAPS: 500.0000 mg | ORAL_CAPSULE | Freq: Four times a day (QID) | ORAL | 0 refills | Status: DC

## 2023-04-27 MED ORDER — SODIUM CHLORIDE 0.9 % IV SOLN
2.0000 g | Freq: Once | INTRAVENOUS | Status: AC
  Administered 2023-04-27: 2 g via INTRAVENOUS
  Filled 2023-04-27: qty 20

## 2023-04-27 NOTE — ED Provider Notes (Signed)
 [place] EMERGENCY DEPARTMENT AT [facility] Provider Note   CSN: [id number] Arrival date & time: 04/27/23  1212     History {Add pertinent medical, surgical, social history, OB history to HPI:1} Chief Complaint  Patient presents with   Dysuria    [name] is a 68 y.o. female.  Patient has a history of COPD and complains of dysuria.   Dysuria      Home Medications Prior to Admission medications   Medication Sig Start Date End Date Taking? Authorizing Provider  cephALEXin (KEFLEX) 500 MG capsule Take 1 capsule (500 mg total) by mouth 4 (four) times daily. 04/27/23  Yes Bartlomiej Jenkinson, MD  albuterol (VENTOLIN HFA) 108 (90 Base) MCG/ACT inhaler Inhale 1-2 puffs into the lungs every 6 (six) hours as needed for wheezing or shortness of breath. 06/05/22   Thomes Flicker, PA  amitriptyline (ELAVIL) 50 MG tablet Take 50 mg by mouth at bedtime. 10/23/22   [provider]  amLODipine (NORVASC) 10 MG tablet Take 10 mg by mouth daily. 10/23/22   [provider]  amoxicillin-clavulanate (AUGMENTIN) 875-125 MG tablet Take 1 tablet by mouth 2 (two) times daily. 03/26/23   Danford, Willis Harter, MD  ANORO ELLIPTA 62.5-25 MCG/ACT AEPB Inhale 1 puff into the lungs daily. 09/17/21   [provider]  atorvastatin (LIPITOR) 40 MG tablet Take 1 tablet (40 mg total) by mouth daily at 6 PM. 08/22/14   Rai, Ripudeep K, MD  baclofen (LIORESAL) 10 MG tablet Take 10 mg by mouth 2 (two) times daily.    [provider]  carvedilol (COREG) 6.25 MG tablet Take 6.25 mg by mouth 2 (two) times daily. 09/27/22   [provider]  diclofenac Sodium (VOLTAREN) 1 % GEL Apply 2 g topically 4 (four) times daily as needed (joint pain). 07/18/21   Amponsah, Prosper M, MD  DULoxetine (CYMBALTA) 60 MG capsule Take 60 mg by mouth daily. 04/07/22   [provider]  gabapentin (NEURONTIN) 800 MG tablet Take 800 mg by mouth 3 (three) times daily.    [provider]  ibuprofen (ADVIL) 800 MG tablet Take 800 mg by mouth every 8 (eight) hours as needed for moderate pain (pain score 4-6). 10/23/22   [provider]  linaclotide (LINZESS) 145 MCG CAPS capsule Take 145 mcg by mouth every other day.    [provider]  naloxone [facility]) nasal spray 4 mg/0.1 mL Place 1 spray into the nose once. As needed for overdose    [provider]  oxyCODONE-acetaminophen (PERCOCET) 10-325 MG tablet Take 1 tablet by mouth every 6 (six) hours as needed. 03/11/23   [provider]  pantoprazole (PROTONIX) 40 MG tablet Take 40 mg by mouth daily.    [provider]  senna-docusate (SENOKOT-S) 8.6-50 MG tablet Take 2 tablets by mouth at bedtime as needed for moderate constipation. 04/26/22   Amin, Ankit C, MD  TRELEGY ELLIPTA 100-62.5-25 MCG/ACT AEPB Take 1 puff by mouth every morning. 12/03/22   [provider]      Allergies    Iodine-131, Iohexol, Buprenorphine, Levofloxacin, Sulfamethoxazole-trimethoprim, Zofran [ondansetron hcl], Heparin, and Morphine and codeine    Review of Systems   Review of Systems  Genitourinary:  Positive for dysuria.    Physical Exam Updated Vital Signs BP 131/70   Pulse (!) 58   Temp 97.9 F (36.6 C) (Oral)   Resp 18   Ht 5' 6.5" (1.689 m)   Wt 78.5 kg   SpO2  100%   BMI 27.50 kg/m  Physical Exam  ED Results / Procedures / Treatments   Labs (all labs ordered are listed, but only abnormal results are displayed) Labs Reviewed  CBC WITH DIFFERENTIAL/PLATELET - Abnormal; Notable for the following components:      Result Value   RBC 3.77 (*)    Hemoglobin 10.1 (*)    HCT 32.7 (*)    All other components within normal limits  BASIC METABOLIC PANEL WITH GFR - Abnormal; Notable for the following components:   Potassium 2.8 (*)    CO2 20 (*)    Glucose, Bld 109 (*)    Calcium 7.9 (*)    All other components within normal limits  URINALYSIS, ROUTINE W REFLEX MICROSCOPIC -  Abnormal; Notable for the following components:   APPearance CLOUDY (*)    Hgb urine dipstick SMALL (*)    Protein, ur 30 (*)    Leukocytes,Ua LARGE (*)    Bacteria, UA MANY (*)    All other components within normal limits  URINE CULTURE    EKG None  Radiology No results found.  Procedures Procedures  {Document cardiac monitor, telemetry assessment procedure when appropriate:1}  Medications Ordered in ED Medications  cefTRIAXone (ROCEPHIN) 2 g in sodium chloride 0.9 % 100 mL IVPB (has no administration in time range)  HYDROmorphone (DILAUDID) injection 0.5 mg (0.5 mg Intravenous Given 04/27/23 1304)    ED Course/ Medical Decision Making/ A&P   {   Click here for ABCD2, HEART and other calculatorsREFRESH Note before signing :1}                              Medical Decision Making Amount and/or Complexity of Data Reviewed Labs: ordered.  Risk Prescription drug management.   Patient with urinary tract infection.  She is given Rocephin in the emergency department and will be treated with Keflex.  She will follow-up with her PCP next week  {Document critical care time when appropriate:1} {Document review of labs and clinical decision tools ie heart score, Chads2Vasc2 etc:1}  {Document your independent review of radiology images, and any outside records:1} {Document your discussion with family members, caretakers, and with consultants:1} {Document social determinants of health affecting pt's care:1} {Document your decision making why or why not admission, treatments were needed:1} Final Clinical Impression(s) / ED Diagnoses Final diagnoses:  Acute cystitis without hematuria    Rx / DC Orders ED Discharge Orders          Ordered    cephALEXin (KEFLEX) 500 MG capsule  4 times daily        04/27/23 1502

## 2023-04-27 NOTE — ED Notes (Signed)
 Pt is lethargic, falling asleep during triage multiple times and falling asleep mid- sentence. Pt is easily aroused, states she took a muscle relaxer PTA.

## 2023-04-27 NOTE — ED Triage Notes (Signed)
 BIB EMS from home, pt has had frequent UTIS for the last year, currently has a UTI for the last month. Treated a month ago and just finished abx a few days ago due to not taking medication appropriately. Has dysuria. Pt is lethargic on arrival, pt reports she had a muscle relaxer an hour ago.

## 2023-04-27 NOTE — Discharge Instructions (Signed)
 Follow-up with your family doctor next week for recheck.  Drink plenty of fluids and take your antibiotics

## 2023-04-27 NOTE — ED Notes (Signed)
Unsuccessful IV attempt x 3 

## 2023-04-30 LAB — URINE CULTURE: Culture: >=100000 — AB

## 2023-05-01 ENCOUNTER — Telehealth (HOSPITAL_BASED_OUTPATIENT_CLINIC_OR_DEPARTMENT_OTHER): Payer: Self-pay

## 2023-05-01 NOTE — Telephone Encounter (Signed)
 Post ED Visit - Positive Culture Follow-up  Culture report reviewed by antimicrobial stewardship pharmacist: [facility] []  Court Distance, Pharm.D. []  Skeet Duke, Pharm.D., BCPS AQ-ID []  Leslee Rase, Pharm.D., BCPS []  Garland Junk, Pharm.D., BCPS []  [place], [place].D., BCPS, AAHIVP []  Alcide Aly, Pharm.D., BCPS, AAHIVP []  Jerri Morale, PharmD, BCPS []  Graham Laws, PharmD, BCPS []  Cleda Curly, PharmD, BCPS []  Tamar Fairly, PharmD []  Ballard Levels, PharmD, BCPS []  Ollen Beverage, PharmD  [facility] []  Arlyne Bering, PharmD []  Sherryle Don, PharmD []  Van Gelinas, PharmD []  Delila Felty, Rph []  [place]) Cleora Daft, PharmD []  Augustina Block, PharmD []  Arie Kurtz, PharmD [x]  Sharlyn Deaner, PharmD []  Agnes Hose, PharmD []  Kendall Pauls, PharmD []  Gladstone Lamer, PharmD []  Armanda Bern, PharmD []  Tera Fellows, PharmD   Positive urine culture Treated with Cephalexin , organism sensitive to the same and no further patient follow-up is required at this time.  Delena Feil 05/01/2023, 8:32 AM

## 2023-07-04 ENCOUNTER — Encounter (HOSPITAL_COMMUNITY): Payer: Self-pay | Admitting: Internal Medicine

## 2023-07-04 ENCOUNTER — Other Ambulatory Visit: Payer: Self-pay

## 2023-07-04 ENCOUNTER — Emergency Department (HOSPITAL_COMMUNITY)

## 2023-07-04 ENCOUNTER — Observation Stay (HOSPITAL_COMMUNITY)
Admission: EM | Admit: 2023-07-04 | Discharge: 2023-07-05 | Disposition: A | Discharge status: Home Health | Attending: Family Medicine | Admitting: Family Medicine

## 2023-07-04 DIAGNOSIS — F17210 Nicotine dependence, cigarettes, uncomplicated: Secondary | ICD-10-CM | POA: Diagnosis not present

## 2023-07-04 DIAGNOSIS — I10 Essential (primary) hypertension: Secondary | ICD-10-CM | POA: Diagnosis present

## 2023-07-04 DIAGNOSIS — M069 Rheumatoid arthritis, unspecified: Secondary | ICD-10-CM | POA: Diagnosis present

## 2023-07-04 DIAGNOSIS — N179 Acute kidney failure, unspecified: Secondary | ICD-10-CM | POA: Diagnosis not present

## 2023-07-04 DIAGNOSIS — G9340 Encephalopathy, unspecified: Secondary | ICD-10-CM | POA: Insufficient documentation

## 2023-07-04 DIAGNOSIS — J449 Chronic obstructive pulmonary disease, unspecified: Secondary | ICD-10-CM | POA: Diagnosis not present

## 2023-07-04 DIAGNOSIS — I129 Hypertensive chronic kidney disease with stage 1 through stage 4 chronic kidney disease, or unspecified chronic kidney disease: Secondary | ICD-10-CM | POA: Diagnosis not present

## 2023-07-04 DIAGNOSIS — R400 Somnolence: Principal | ICD-10-CM | POA: Diagnosis present

## 2023-07-04 DIAGNOSIS — N183 Chronic kidney disease, stage 3 unspecified: Secondary | ICD-10-CM | POA: Insufficient documentation

## 2023-07-04 DIAGNOSIS — Z79899 Other long term (current) drug therapy: Secondary | ICD-10-CM

## 2023-07-04 DIAGNOSIS — Z720 Tobacco use: Secondary | ICD-10-CM | POA: Diagnosis present

## 2023-07-04 DIAGNOSIS — N390 Urinary tract infection, site not specified: Secondary | ICD-10-CM | POA: Diagnosis not present

## 2023-07-04 DIAGNOSIS — F1410 Cocaine abuse, uncomplicated: Secondary | ICD-10-CM | POA: Diagnosis not present

## 2023-07-04 LAB — COMPREHENSIVE METABOLIC PANEL WITH GFR
ALT: 15 U/L (ref 0–44)
AST: 27 U/L (ref 15–41)
Albumin: 3.6 g/dL (ref 3.5–5.0)
Alkaline Phosphatase: 88 U/L (ref 38–126)
Anion gap: 13 (ref 5–15)
BUN: 39 mg/dL — ABNORMAL HIGH (ref 8–23)
CO2: 20 mmol/L — ABNORMAL LOW (ref 22–32)
Calcium: 8.9 mg/dL (ref 8.9–10.3)
Chloride: 105 mmol/L (ref 98–111)
Creatinine, Ser: 1.94 mg/dL — ABNORMAL HIGH (ref 0.44–1.00)
GFR, Estimated: 28 mL/min — ABNORMAL LOW (ref >60)
Glucose, Bld: 74 mg/dL (ref 70–99)
Potassium: 3.7 mmol/L (ref 3.5–5.1)
Sodium: 138 mmol/L (ref 135–145)
Total Bilirubin: 0.6 mg/dL (ref 0.0–1.2)
Total Protein: 7.2 g/dL (ref 6.5–8.1)

## 2023-07-04 LAB — CBC WITH DIFFERENTIAL/PLATELET
Abs Immature Granulocytes: 0.01 10*3/uL (ref 0.00–0.07)
Basophils Absolute: 0 10*3/uL (ref 0.0–0.1)
Basophils Relative: 1 %
Eosinophils Absolute: 0.3 10*3/uL (ref 0.0–0.5)
Eosinophils Relative: 4 %
HCT: 36.3 % (ref 36.0–46.0)
Hemoglobin: 11.3 g/dL — ABNORMAL LOW (ref 12.0–15.0)
Immature Granulocytes: 0 %
Lymphocytes Relative: 32 %
Lymphs Abs: 1.8 10*3/uL (ref 0.7–4.0)
MCH: 26.6 pg (ref 26.0–34.0)
MCHC: 31.1 g/dL (ref 30.0–36.0)
MCV: 85.4 fL (ref 80.0–100.0)
Monocytes Absolute: 0.4 10*3/uL (ref 0.1–1.0)
Monocytes Relative: 7 %
Neutro Abs: 3.2 10*3/uL (ref 1.7–7.7)
Neutrophils Relative %: 56 %
Platelets: 334 10*3/uL (ref 150–400)
RBC: 4.25 MIL/uL (ref 3.87–5.11)
RDW: 16.7 % — ABNORMAL HIGH (ref 11.5–15.5)
WBC: 5.7 10*3/uL (ref 4.0–10.5)
nRBC: 0 % (ref 0.0–0.2)

## 2023-07-04 LAB — AMMONIA: Ammonia: 23 umol/L (ref 9–35)

## 2023-07-04 LAB — BLOOD GAS, VENOUS
Acid-base deficit: 3.4 mmol/L — ABNORMAL HIGH (ref 0.0–2.0)
Bicarbonate: 23.6 mmol/L (ref 20.0–28.0)
O2 Saturation: 30.5 %
Patient temperature: 37
pCO2, Ven: 49 mmHg (ref 44–60)
pH, Ven: 7.29 (ref 7.25–7.43)
pO2, Ven: <31 mmHg — CL (ref 32–45)

## 2023-07-04 LAB — ETHANOL: Alcohol, Ethyl (B): <15 mg/dL (ref <15)

## 2023-07-04 MED ORDER — ACETAMINOPHEN 650 MG RE SUPP: 650.0000 mg | Freq: Four times a day (QID) | RECTAL | Status: DC | PRN

## 2023-07-04 MED ORDER — SODIUM CHLORIDE 0.9 % IV SOLN: INTRAVENOUS | Status: AC

## 2023-07-04 MED ORDER — OXYCODONE-ACETAMINOPHEN 5-325 MG PO TABS
1.0000 | ORAL_TABLET | Freq: Three times a day (TID) | ORAL | Status: DC | PRN
  Administered 2023-07-04: 1 via ORAL
  Filled 2023-07-04: qty 1

## 2023-07-04 MED ORDER — ACETAMINOPHEN 325 MG PO TABS
650.0000 mg | ORAL_TABLET | Freq: Four times a day (QID) | ORAL | Status: DC | PRN
  Filled 2023-07-04: qty 2

## 2023-07-04 MED ORDER — ALBUTEROL SULFATE (2.5 MG/3ML) 0.083% IN NEBU
2.5000 mg | INHALATION_SOLUTION | RESPIRATORY_TRACT | Status: DC | PRN
  Administered 2023-07-05: 2.5 mg via RESPIRATORY_TRACT
  Filled 2023-07-04: qty 3

## 2023-07-04 MED ORDER — LACTATED RINGERS IV BOLUS: 1000.0000 mL | Freq: Once | INTRAVENOUS | Status: DC
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
## 2023-07-04 NOTE — ED Notes (Signed)
 Fluids not started, IV access needed, IV consult team called.

## 2023-07-04 NOTE — ED Notes (Signed)
 Floor nurse called

## 2023-07-04 NOTE — ED Triage Notes (Signed)
 Pt complaining of back pain. Pt is unable to stay awake to answer questions and speech is slurred and incomprehensible at times.

## 2023-07-05 ENCOUNTER — Encounter (HOSPITAL_COMMUNITY): Payer: Self-pay | Admitting: Internal Medicine

## 2023-07-05 DIAGNOSIS — Z79899 Other long term (current) drug therapy: Secondary | ICD-10-CM

## 2023-07-05 DIAGNOSIS — R400 Somnolence: Secondary | ICD-10-CM | POA: Diagnosis not present

## 2023-07-05 LAB — URINALYSIS, W/ REFLEX TO CULTURE (INFECTION SUSPECTED)
Bacteria, UA: NONE SEEN
Bilirubin Urine: NEGATIVE
Glucose, UA: NEGATIVE mg/dL
Hgb urine dipstick: NEGATIVE
Ketones, ur: NEGATIVE mg/dL
Nitrite: NEGATIVE
Protein, ur: NEGATIVE mg/dL
Specific Gravity, Urine: 1.016 (ref 1.005–1.030)
pH: 7 (ref 5.0–8.0)

## 2023-07-05 LAB — BASIC METABOLIC PANEL WITH GFR
Anion gap: 10 (ref 5–15)
BUN: 30 mg/dL — ABNORMAL HIGH (ref 8–23)
CO2: 20 mmol/L — ABNORMAL LOW (ref 22–32)
Calcium: 8.7 mg/dL — ABNORMAL LOW (ref 8.9–10.3)
Chloride: 110 mmol/L (ref 98–111)
Creatinine, Ser: 1.32 mg/dL — ABNORMAL HIGH (ref 0.44–1.00)
GFR, Estimated: 44 mL/min — ABNORMAL LOW (ref >60)
Glucose, Bld: 84 mg/dL (ref 70–99)
Potassium: 3.9 mmol/L (ref 3.5–5.1)
Sodium: 140 mmol/L (ref 135–145)

## 2023-07-05 LAB — CBC
HCT: 37 % (ref 36.0–46.0)
Hemoglobin: 11.4 g/dL — ABNORMAL LOW (ref 12.0–15.0)
MCH: 26.6 pg (ref 26.0–34.0)
MCHC: 30.8 g/dL (ref 30.0–36.0)
MCV: 86.4 fL (ref 80.0–100.0)
Platelets: 280 10*3/uL (ref 150–400)
RBC: 4.28 MIL/uL (ref 3.87–5.11)
RDW: 16.7 % — ABNORMAL HIGH (ref 11.5–15.5)
WBC: 5 10*3/uL (ref 4.0–10.5)
nRBC: 0 % (ref 0.0–0.2)

## 2023-07-05 LAB — RAPID URINE DRUG SCREEN, HOSP PERFORMED
Amphetamines: NOT DETECTED
Barbiturates: NOT DETECTED
Benzodiazepines: NOT DETECTED
Cocaine: POSITIVE — AB
Opiates: NOT DETECTED
Tetrahydrocannabinol: NOT DETECTED

## 2023-07-05 MED ORDER — SODIUM CHLORIDE 0.9 % IV BOLUS
1000.0000 mL | Freq: Once | INTRAVENOUS | Status: AC
  Administered 2023-07-05: 1000 mL via INTRAVENOUS
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
## 2023-07-05 NOTE — Plan of Care (Signed)

## 2023-07-05 NOTE — Discharge Summary (Signed)
 Physician Discharge Summary  [name] FMW:[id number] DOB: [birth year] DOA: 07/04/2023  PCP: Maree Leni Edyth DELENA, MD  Admit date: 07/04/2023 Discharge date: 07/05/2023    Admitted From: Home Disposition: Home Home  Recommendations for Outpatient Follow-up:  Follow up with PCP in 1-2 weeks Please obtain BMP/CBC in one week Please follow up with your PCP on the following pending results: Unresulted Labs (From admission, onward)    None         Home Health: None Equipment/Devices: None  Discharge Condition: Stable CODE STATUS: Full code Diet recommendation: Cardiac  Due to brief hospitalization, I have copied admitting hospitalist HPI as below. HPI: [name] is a 68 y.o. female with medical history significant for CKD stage III, COPD on room air, rheumatoid arthritis, hypertension, cocaine abuse being admitted to the hospital with complaints of back pain found to be encephalopathic with acute kidney injury superimposed on chronic kidney disease.  Patient is very poor historian, she is quite sleepy although easily arousable and conversant.  Falls back asleep multiple times during interview.  She had very similar presentations on chart review, October 2024, March 2025, and June 2025 at [facility].  During her most recent hospitalization at [facility], she was diagnosed with UTI states that she picked up the prescribed antibiotics.  She denies any fevers, dysuria, or vomiting but has been having some nausea the last few days.  Presented to the ER today with complaints of back pain, admits to not eating or drinking very much and she was found to have acute kidney injury.   Subjective: Seen and examined, patient fully alert and oriented, other than mild dizziness, she has no other complaint.  Plan of discharge discussed with the patient and she is in agreement.  See details below.  Brief/Interim Summary: Patient was briefly hospitalized due to subdural signs/acute metabolic  encephalopathy which as well as dehydration causing AKI.  Patient's baseline creatinine is around 0.9, she presented with 1.9.  She was hydrated, currently creatinine down to 1.3.  Reviewed patient's medications with the patient, she appears to be on several medications which can cause altered mental status or somnolence such as Cymbalta , gabapentin , baclofen , amitriptyline , Percocet and cocaine abuse.  She endorsed abusing cocaine about a week ago.  UDS is collected, results pending.  Advised patient to reduce some of those medications as well as stop cocaine.  She does understand that cocaine is the reason for her repeat hospitalization.  She did not acknowledge other medications as contributing source as well.  Regardless, she is currently fully alert and oriented with no symptoms.  Plan is to hydrate her with 1 L of IV fluid bolus over the course of next 4 hours and then discharged home.  Advised to drink plenty of fluids.  Discharge Diagnoses:  Principal Problem:   Somnolence Active Problems:   Rheumatoid arthritis (HCC)   Essential hypertension   Cocaine abuse (HCC)   AKI (acute kidney injury) (HCC)   Tobacco abuse   Polypharmacy    Discharge Instructions   Allergies as of 07/05/2023       Reactions   Iodinated Contrast Media Anaphylaxis   Levofloxacin  Other (See Comments)   BASELINE PROLONGED QTc.     Nsaids Other (See Comments)   CKD stage 3    Zofran  [ondansetron  Hcl] Other (See Comments)   BASELINE PROLONGED QTc.   Buprenorphine Other (See Comments)   Heparin  Itching, Other (See Comments)   Pt is able to tolerate IV heparin , but not  sub-Q.   Morphine  And Codeine Itching        Medication List     TAKE these medications    albuterol  108 (90 Base) MCG/ACT inhaler Commonly known as: VENTOLIN  HFA Inhale 1-2 puffs into the lungs every 6 (six) hours as needed for wheezing or shortness of breath.   amitriptyline  50 MG tablet Commonly known as: ELAVIL  Take 50 mg by  mouth at bedtime.   amLODipine  10 MG tablet Commonly known as: NORVASC  Take 10 mg by mouth daily.   Anoro Ellipta  62.5-25 MCG/ACT Aepb Generic drug: umeclidinium-vilanterol Inhale 1 puff into the lungs daily.   atorvastatin  80 MG tablet Commonly known as: LIPITOR Take 80 mg by mouth at bedtime.   baclofen  10 MG tablet Commonly known as: LIORESAL  Take 10 mg by mouth 2 (two) times daily.   carvedilol  6.25 MG tablet Commonly known as: COREG  Take 6.25 mg by mouth 2 (two) times daily.   cephALEXin  500 MG capsule Commonly known as: KEFLEX  Take 1 capsule (500 mg total) by mouth 4 (four) times daily.   diclofenac  Sodium 1 % Gel Commonly known as: VOLTAREN  Apply 2 g topically 4 (four) times daily as needed (joint pain).   DULoxetine  60 MG capsule Commonly known as: CYMBALTA  Take 60 mg by mouth daily.   gabapentin  800 MG tablet Commonly known as: NEURONTIN  Take 800 mg by mouth 3 (three) times daily.   ibuprofen  800 MG tablet Commonly known as: ADVIL  Take 800 mg by mouth every 8 (eight) hours as needed for moderate pain (pain score 4-6).   linaclotide  145 MCG Caps capsule Commonly known as: LINZESS  Take 145 mcg by mouth every other day.   naloxone  4 MG/0.1ML Liqd nasal spray kit Commonly known as: NARCAN  Place 1 spray into the nose as needed (opioid overdose - respiratory distress).   oxyCODONE -acetaminophen  10-325 MG tablet Commonly known as: PERCOCET Take 1 tablet by mouth every 6 (six) hours as needed for pain.   pantoprazole  40 MG tablet Commonly known as: PROTONIX  Take 40 mg by mouth daily.   senna-docusate 8.6-50 MG tablet Commonly known as: Senokot-S Take 2 tablets by mouth at bedtime as needed for moderate constipation.   Trelegy Ellipta 100-62.5-25 MCG/ACT Aepb Generic drug: Fluticasone -Umeclidin-Vilant Take 1 puff by mouth every morning.        Follow-up Information     Maree Leni Edyth DELENA, MD Follow up in 1 week(s).   Specialty: Family  Medicine Contact information: [address] [place] [address] [phone]                Allergies  Allergen Reactions   Iodinated Contrast Media Anaphylaxis   Levofloxacin  Other (See Comments)    BASELINE PROLONGED QTc.     Nsaids Other (See Comments)    CKD stage 3    Zofran  [Ondansetron  Hcl] Other (See Comments)    BASELINE PROLONGED QTc.   Buprenorphine Other (See Comments)   Heparin  Itching and Other (See Comments)    Pt is able to tolerate IV heparin , but not sub-Q.   Morphine  And Codeine Itching    Consultations: None   Procedures/Studies: CT Head Wo Contrast Result Date: 07/04/2023 CLINICAL DATA:  Altered mental status, nontraumatic EXAM: CT HEAD WITHOUT CONTRAST TECHNIQUE: Contiguous axial images were obtained from the base of the skull through the vertex without intravenous contrast. RADIATION DOSE REDUCTION: This exam was performed according to the departmental dose-optimization program which includes automated exposure control, adjustment of the mA and/or kV according to patient  size and/or use of iterative reconstruction technique. COMPARISON:  03/24/2023, 06/28/2023 FINDINGS: Brain: Stable diffuse brain atrophy pattern and minimal chronic white matter microvascular ischemic changes throughout the periventricular white matter. No acute intracranial hemorrhage, new infarction, mass lesion, midline shift, herniation, hydrocephalus, or extra-axial fluid collection. No focal mass effect or edema. Cisterns are patent. No gross cerebellar abnormality. Vascular: No hyperdense vessel or unexpected calcification. Skull: Normal. Negative for fracture or focal lesion. Sinuses/Orbits: No acute finding. Other: None. IMPRESSION: Stable CT head. No acute intracranial abnormality by noncontrast CT. Electronically Signed   By: CHRISTELLA.  Shick M.D.   On: 07/04/2023 15:12   CT Renal Stone Study Result Date: 07/04/2023 CLINICAL DATA:  Back pain altered mental status EXAM: CT ABDOMEN  AND PELVIS WITHOUT CONTRAST CT LUMBAR SPINE WITHOUT CONTRAST TECHNIQUE: Multidetector CT imaging of the abdomen and pelvis was performed following the standard protocol without IV contrast. Multidetector CT imaging of the lumbar spine was performed following the standard protocol without IV contrast. RADIATION DOSE REDUCTION: This exam was performed according to the departmental dose-optimization program which includes automated exposure control, adjustment of the mA and/or kV according to patient size and/or use of iterative reconstruction technique. COMPARISON:  11/04/2022 FINDINGS: CT ABDOMEN PELVIS FINDINGS Examination is generally limited by lack of intravenous contrast and breath motion artifact throughout. Lower chest: No acute findings. Cardiomegaly. Coronary artery calcifications. Bibasilar scarring or atelectasis. Large hiatal hernia with nearly complete intrathoracic position of the stomach. Hepatobiliary: No solid liver abnormality is seen. No gallstones, gallbladder wall thickening, or biliary dilatation. Pancreas: Unremarkable. No pancreatic ductal dilatation or surrounding inflammatory changes. Spleen: Normal in size without significant abnormality. Adrenals/Urinary Tract: Adrenal glands are unremarkable. Kidneys are normal, without renal calculi, obvious solid lesion, or hydronephrosis. Bladder is unremarkable. Stomach/Bowel: Stomach is within normal limits. Appendectomy. No evidence of bowel wall thickening, distention, or inflammatory changes. Vascular/Lymphatic: Aortic atherosclerosis. No enlarged abdominal or pelvic lymph nodes. Reproductive: Status post hysterectomy. Other: No abdominal wall hernia or abnormality. No ascites. Musculoskeletal: No acute osseous findings. CT LUMBAR SPINE FINDINGS Alignment: Dextroscoliosis of the lumbar spine, apex L3. Vertebral bodies: Intact. No fracture or dislocation. Disc spaces: Moderate multilevel disc degenerative disease and osteophytosis throughout.  Moderate facet degenerative change of the lower lumbar levels. Paraspinous soft tissues: Unremarkable. IMPRESSION: 1. Examination is generally limited by lack of intravenous contrast and breath motion artifact throughout. 2. Within this limitation, no acute noncontrast CT findings of the abdomen or pelvis to explain abdominal pain. 3. No fracture or dislocation of the lumbar spine. 4. Dextroscoliosis of the lumbar spine, apex L3. Moderate multilevel disc degenerative disease and osteophytosis throughout. Moderate facet degenerative change of the lower lumbar levels. 5. Large hiatal hernia with nearly complete intrathoracic position of the stomach. 6. Cardiomegaly and coronary artery disease. Aortic Atherosclerosis (ICD10-I70.0). Electronically Signed   By: Marolyn JONETTA Jaksch M.D.   On: 07/04/2023 15:00   CT L-SPINE NO CHARGE Result Date: 07/04/2023 CLINICAL DATA:  Back pain altered mental status EXAM: CT ABDOMEN AND PELVIS WITHOUT CONTRAST CT LUMBAR SPINE WITHOUT CONTRAST TECHNIQUE: Multidetector CT imaging of the abdomen and pelvis was performed following the standard protocol without IV contrast. Multidetector CT imaging of the lumbar spine was performed following the standard protocol without IV contrast. RADIATION DOSE REDUCTION: This exam was performed according to the departmental dose-optimization program which includes automated exposure control, adjustment of the mA and/or kV according to patient size and/or use of iterative reconstruction technique. COMPARISON:  11/04/2022 FINDINGS: CT ABDOMEN PELVIS FINDINGS Examination is  generally limited by lack of intravenous contrast and breath motion artifact throughout. Lower chest: No acute findings. Cardiomegaly. Coronary artery calcifications. Bibasilar scarring or atelectasis. Large hiatal hernia with nearly complete intrathoracic position of the stomach. Hepatobiliary: No solid liver abnormality is seen. No gallstones, gallbladder wall thickening, or biliary  dilatation. Pancreas: Unremarkable. No pancreatic ductal dilatation or surrounding inflammatory changes. Spleen: Normal in size without significant abnormality. Adrenals/Urinary Tract: Adrenal glands are unremarkable. Kidneys are normal, without renal calculi, obvious solid lesion, or hydronephrosis. Bladder is unremarkable. Stomach/Bowel: Stomach is within normal limits. Appendectomy. No evidence of bowel wall thickening, distention, or inflammatory changes. Vascular/Lymphatic: Aortic atherosclerosis. No enlarged abdominal or pelvic lymph nodes. Reproductive: Status post hysterectomy. Other: No abdominal wall hernia or abnormality. No ascites. Musculoskeletal: No acute osseous findings. CT LUMBAR SPINE FINDINGS Alignment: Dextroscoliosis of the lumbar spine, apex L3. Vertebral bodies: Intact. No fracture or dislocation. Disc spaces: Moderate multilevel disc degenerative disease and osteophytosis throughout. Moderate facet degenerative change of the lower lumbar levels. Paraspinous soft tissues: Unremarkable. IMPRESSION: 1. Examination is generally limited by lack of intravenous contrast and breath motion artifact throughout. 2. Within this limitation, no acute noncontrast CT findings of the abdomen or pelvis to explain abdominal pain. 3. No fracture or dislocation of the lumbar spine. 4. Dextroscoliosis of the lumbar spine, apex L3. Moderate multilevel disc degenerative disease and osteophytosis throughout. Moderate facet degenerative change of the lower lumbar levels. 5. Large hiatal hernia with nearly complete intrathoracic position of the stomach. 6. Cardiomegaly and coronary artery disease. Aortic Atherosclerosis (ICD10-I70.0). Electronically Signed   By: Marolyn JONETTA Jaksch M.D.   On: 07/04/2023 15:00     Discharge Exam: Vitals:   07/05/23 0534 07/05/23 0543  BP:  134/76  Pulse:  64  Resp:  18  Temp:  (!) 97.5 F (36.4 C)  SpO2: 100% 99%   Vitals:   07/05/23 0138 07/05/23 0139 07/05/23 0534 07/05/23  0543  BP: (!) 158/78 (!) 158/76  134/76  Pulse: 63 (!) 59  64  Resp: 18   18  Temp: 97.8 F (36.6 C)   (!) 97.5 F (36.4 C)  TempSrc: Oral   Oral  SpO2: 100% 100% 100% 99%  Weight:      Height:        General: Pt is alert, awake, not in acute distress Cardiovascular: RRR, S1/S2 +, no rubs, no gallops Respiratory: CTA bilaterally, no wheezing, no rhonchi Abdominal: Soft, NT, ND, bowel sounds + Extremities: no edema, no cyanosis    The results of significant diagnostics from this hospitalization (including imaging, microbiology, ancillary and laboratory) are listed below for reference.     Microbiology: No results found for this or any previous visit (from the past 240 hours).   Labs: BNP (last 3 results) No results for input(s): BNP in the last 8760 hours. Basic Metabolic Panel: Recent Labs  Lab 07/04/23 1212 07/05/23 0428  NA 138 140  K 3.7 3.9  CL 105 110  CO2 20* 20*  GLUCOSE 74 84  BUN 39* 30*  CREATININE 1.94* 1.32*  CALCIUM  8.9 8.7*   Liver Function Tests: Recent Labs  Lab 07/04/23 1212  AST 27  ALT 15  ALKPHOS 88  BILITOT 0.6  PROT 7.2  ALBUMIN  3.6   No results for input(s): LIPASE, AMYLASE in the last 168 hours. Recent Labs  Lab 07/04/23 1212  AMMONIA 23   CBC: Recent Labs  Lab 07/04/23 1212 07/05/23 0428  WBC 5.7 5.0  NEUTROABS 3.2  --  HGB 11.3* 11.4*  HCT 36.3 37.0  MCV 85.4 86.4  PLT 334 280   Cardiac Enzymes: No results for input(s): CKTOTAL, CKMB, CKMBINDEX, TROPONINI in the last 168 hours. BNP: Invalid input(s): POCBNP CBG: No results for input(s): GLUCAP in the last 168 hours. D-Dimer No results for input(s): DDIMER in the last 72 hours. Hgb A1c No results for input(s): HGBA1C in the last 72 hours. Lipid Profile No results for input(s): CHOL, HDL, LDLCALC, TRIG, CHOLHDL, LDLDIRECT in the last 72 hours. Thyroid function studies No results for input(s): TSH, T4TOTAL, T3FREE,  THYROIDAB in the last 72 hours.  Invalid input(s): FREET3 Anemia work up No results for input(s): VITAMINB12, FOLATE, FERRITIN, TIBC, IRON, RETICCTPCT in the last 72 hours. Urinalysis    Component Value Date/Time   COLORURINE YELLOW 07/05/2023 0830   APPEARANCEUR CLOUDY (A) 07/05/2023 0830   LABSPEC 1.016 07/05/2023 0830   PHURINE 7.0 07/05/2023 0830   GLUCOSEU NEGATIVE 07/05/2023 0830   HGBUR NEGATIVE 07/05/2023 0830   BILIRUBINUR NEGATIVE 07/05/2023 0830   KETONESUR NEGATIVE 07/05/2023 0830   PROTEINUR NEGATIVE 07/05/2023 0830   UROBILINOGEN 0.2 03/06/2021 1417   NITRITE NEGATIVE 07/05/2023 0830   LEUKOCYTESUR MODERATE (A) 07/05/2023 0830   Sepsis Labs Recent Labs  Lab 07/04/23 1212 07/05/23 0428  WBC 5.7 5.0   Microbiology No results found for this or any previous visit (from the past 240 hours).  FURTHER DISCHARGE INSTRUCTIONS:   Get Medicines reviewed and adjusted: Please take all your medications with you for your next visit with your Primary MD   Laboratory/radiological data: Please request your Primary MD to go over all hospital tests and procedure/radiological results at the follow up, please ask your Primary MD to get all Hospital records sent to his/her office.   In some cases, they will be blood work, cultures and biopsy results pending at the time of your discharge. Please request that your primary care M.D. goes through all the records of your hospital data and follows up on these results.   Also Note the following: If you experience worsening of your admission symptoms, develop shortness of breath, life threatening emergency, suicidal or homicidal thoughts you must seek medical attention immediately by calling 911 or calling your MD immediately  if symptoms less severe.   You must read complete instructions/literature along with all the possible adverse reactions/side effects for all the Medicines you take and that have been prescribed to  you. Take any new Medicines after you have completely understood and accpet all the possible adverse reactions/side effects.    patient was instructed, not to drive, operate heavy machinery, perform activities at heights, swimming or participation in water  activities or provide baby-sitting services while on Pain, Sleep and Anxiety Medications; until their outpatient Physician has advised to do so again. Also recommended to not to take more than prescribed Pain, Sleep and Anxiety Medications.  It is not advisable to combine anxiety, sleep and pain medications without talking with your primary care provider.     Wear Seat belts while driving.   Please note: You were cared for by a hospitalist during your hospital stay. Once you are discharged, your primary care physician will handle any further medical issues. Please note that NO REFILLS for any discharge medications will be authorized once you are discharged, as it is imperative that you return to your primary care physician (or establish a relationship with a primary care physician if you do not have one) for your post hospital discharge needs so that  they can reassess your need for medications and monitor your lab values  Time coordinating discharge: Over 30 minutes  SIGNED:   Fredia Skeeter, MD  [facility] 07/05/2023, 9:54 AM *Please note that this is a verbal dictation therefore any spelling or grammatical errors are due to the Dragon Medical One system interpretation. If 7PM-7AM, please contact night-coverage [email or web address]

## 2023-07-05 NOTE — Care Management Obs Status (Signed)
 MEDICARE OBSERVATION STATUS NOTIFICATION   Patient Details  Name: Francee Setzer MRN: [id number] Date of Birth: 1955/03/07   Medicare Observation Status Notification Given:  Yes    Doneta Glenys DASEN, RN 07/05/2023, 10:48 AM

## 2023-07-05 NOTE — Evaluation (Signed)
 Occupational Therapy Evaluation Patient Details Name: [name] MRN: [id number] DOB: [birth year] Today's Date: 07/05/2023   History of Present Illness   [name] is a 68 y.o. female with medical history significant for CKD stage III, COPD on room air, rheumatoid arthritis, hypertension, cocaine abuse being admitted to the hospital with complaints of back pain found to be encephalopathic with acute kidney injury superimposed on chronic kidney disease.     Clinical Impressions PTA, patient was living in senior apt housing with elevator and amb with SPC. Reports most of her DME is broken including her power scooter and that she does have intermittent assist from family. Currently, patient presents with deficits outlined below including pain, balance and activity tolerance deficits impacting BADL's and functional mobility. OT recommending increased family support and assist in the home with HHOT, a RW and shower seat upon discharge. OT will follow acutely to progress safety and function.      If plan is discharge home, recommend the following:   A little help with walking and/or transfers;A little help with bathing/dressing/bathroom;Assistance with cooking/housework;Assist for transportation;Help with stairs or ramp for entrance     Functional Status Assessment   Patient has had a recent decline in their functional status and demonstrates the ability to make significant improvements in function in a reasonable and predictable amount of time.     Equipment Recommendations   Other (comment);Tub/shower seat Adult nurse)      Precautions/Restrictions   Precautions Precautions: Fall Restrictions [employer] Bearing Restrictions Per Provider Order: No     Mobility Bed Mobility Overal bed mobility: Needs Assistance Bed Mobility: Supine to Sit     Supine to sit: Supervision, HOB elevated, Used rails          Transfers Overall transfer level: Needs  assistance Equipment used: Rolling walker (2 wheels) Transfers: Sit to/from Stand, Bed to chair/wheelchair/BSC Sit to Stand: Contact guard assist     Step pivot transfers: Contact guard assist     General transfer comment: amb to and from bathroom with min cues for hand placement relying on UE support due to weakness in LE's      Balance Overall balance assessment: Needs assistance Sitting-balance support: Feet supported Sitting balance-Leahy Scale: Good     Standing balance support: During functional activity, Bilateral upper extremity supported Standing balance-Leahy Scale: Poor Standing balance comment: reliant on UE support due top LE weakness                           ADL either performed or assessed with clinical judgement   ADL Overall ADL's : Needs assistance/impaired Eating/Feeding: Independent   Grooming: Wash/dry hands;Wash/dry face;Supervision/safety;Standing   Upper Body Bathing: Sitting;Modified independent   Lower Body Bathing: Supervison/ safety;Sitting/lateral leans   Upper Body Dressing : Modified independent;Sitting   Lower Body Dressing: Supervision/safety;Sitting/lateral leans   Toilet Transfer: Supervision/safety;Rolling walker (2 wheels) Toilet Transfer Details (indicate cue type and reason): grab bar assist Toileting- Clothing Manipulation and Hygiene: Supervision/safety;Sit to/from Nurse, children's Details (indicate cue type and reason): reports needing shower seat Functional mobility during ADLs: Contact guard assist;Rolling walker (2 wheels) General ADL Comments: fatigues easily     Vision Baseline Vision/History: 0 No visual deficits;1 Wears glasses Ability to See in Adequate Light: 0 Adequate Patient Visual Report: No change from baseline Vision Assessment?: No apparent visual deficits;Wears glasses for reading            Pertinent Vitals/Pain Pain  Assessment Pain Assessment: 0-10 Pain Score: 7  Pain  Location: joints Pain Descriptors / Indicators: Aching, Sore Pain Intervention(s): Monitored during session, Premedicated before session, Repositioned     Extremity/Trunk Assessment Upper Extremity Assessment Upper Extremity Assessment: Right hand dominant;Generalized weakness   Lower Extremity Assessment Lower Extremity Assessment: Defer to PT evaluation   Cervical / Trunk Assessment Cervical / Trunk Assessment: Normal   Communication Communication Communication: Impaired Factors Affecting Communication: Reduced clarity of speech   Cognition Arousal: Alert   Cognition: No apparent impairments                               Following commands: Intact       Cueing  General Comments   Cueing Techniques: Verbal cues  RA pain in joints, no SOB noted           Home Living Family/patient expects to be discharged to:: Private residence Living Arrangements: Alone Available Help at Discharge: Family;Available PRN/intermittently (dtr and niece) Type of Home: Apartment Home Access: Level entry;Elevator     Home Layout: One level     Bathroom Shower/Tub: Walk-in Pensions consultant: Standard Bathroom Accessibility: Yes How Accessible: Accessible via walker Home Equipment: Cane - single point;Grab bars - tub/shower;Grab bars - toilet   Additional Comments: power w/c and shower seat are broken and does not have RW      Prior Functioning/Environment Prior Level of Function : Needs assist       Physical Assist : Mobility (physical);ADLs (physical) Mobility (physical): Gait ADLs (physical): IADLs Mobility Comments: reports using cane and her power scooter got wet and is broken ADLs Comments: ? pca as per prior eval report, patient lives in senior housing and family assists with IADL's    OT Problem List: Decreased strength;Decreased activity tolerance;Impaired balance (sitting and/or standing);Decreased safety awareness;Pain   OT  Treatment/Interventions: Self-care/ADL training;Therapeutic exercise;Neuromuscular education;Energy conservation;Therapeutic activities;Patient/family education;Balance training      OT Goals(Current goals can be found in the care plan section)   Acute Rehab OT Goals Patient Stated Goal: to go home today OT Goal Formulation: With patient Time For Goal Achievement: 07/19/23 Potential to Achieve Goals: Good ADL Goals Pt Will Transfer to Toilet: with modified independence;ambulating Pt Will Perform Tub/Shower Transfer: Shower transfer;grab bars;shower seat Pt/caregiver will Perform Home Exercise Program: Increased strength;Both right and left upper extremity;Independently;With written HEP provided   OT Frequency:  Min 2X/week    Co-evaluation PT/OT/SLP Co-Evaluation/Treatment: Yes Reason for Co-Treatment: Other (comment) (to ensure timely participation as patient with low activity tolerance) PT goals addressed during session: Mobility/safety with mobility;Balance;Proper use of DME OT goals addressed during session: ADL's and self-care;Proper use of Adaptive equipment and DME      AM-PAC OT 6 Clicks Daily Activity     Outcome Measure Help from another person eating meals?: None Help from another person taking care of personal grooming?: None Help from another person toileting, which includes using toliet, bedpan, or urinal?: A Little Help from another person bathing (including washing, rinsing, drying)?: A Little Help from another person to put on and taking off regular upper body clothing?: None Help from another person to put on and taking off regular lower body clothing?: A Little 6 Click Score: 21   End of Session Equipment Utilized During Treatment: Gait belt;Rolling walker (2 wheels) Nurse Communication: Mobility status  Activity Tolerance: Patient limited by pain Patient left: in chair;with call bell/phone within reach;with chair alarm  set  OT Visit Diagnosis:  Unsteadiness on feet (R26.81);Muscle weakness (generalized) (M62.81);Pain                Time: 8784-8766 OT Time Calculation (min): 18 min Charges:  OT General Charges $OT Visit: 1 Visit OT Evaluation $OT Eval Low Complexity: 1 Low Jayr Lupercio OT/L Acute Rehabilitation Department  [phone]  07/05/2023, 1:21 PM

## 2023-07-05 NOTE — TOC Initial Note (Addendum)
 Transition of Care [facility]) - Initial/Assessment Note    Patient Details  Name: [name] MRN: [id number] Date of Birth: October 08, 1955  Transition of Care [facility]) CM/SW Contact:    Doneta Glenys DASEN, RN Phone Number: 07/05/2023, 12:10 PM  Clinical Narrative:                 MOON completed. CM spoke with patient in the room. PTA states lives in a 2nd floor apartment alone; PCP with [facility] & insurance verified;DME-cane, walker, motorized wheelchair (broken) encouraged patient to reach out to [facility]; denies HH or using oxygen ; staes will need PTAR transport when discharged. TOC following for transportation needs.  Expected Discharge Plan: Home/Self Care Barriers to Discharge: Continued Medical Work up   Patient Goals and CMS Choice   CMS Medicare.gov Compare Post Acute Care list provided to::  (NA) Choice offered to / list presented to : NA  ownership interest in [facility].provided to:: Parent NA    Expected Discharge Plan and Services In-house Referral: NA Discharge Planning Services: CM Consult Post Acute Care Choice: NA Living arrangements for the past 2 months: Apartment Expected Discharge Date: 07/05/23               DME Arranged: N/A DME Agency: NA       HH Arranged: NA HH Agency: NA        Prior Living Arrangements/Services Living arrangements for the past 2 months: Apartment Lives with:: Self (cane, walker, mobilized wheelchair (broken)) Patient language and need for interpreter reviewed:: Yes Do you feel safe going back to the place where you live?: Yes      Need for Family Participation in Patient Care: Yes (Comment)     Criminal Activity/Legal Involvement Pertinent to Current Situation/Hospitalization: No - Comment as needed  Activities of Daily Living   ADL Screening (condition at time of admission) Independently performs ADLs?: Yes (appropriate for developmental age) Is the patient deaf or have difficulty hearing?: No Does  the patient have difficulty seeing, even when wearing glasses/contacts?: No Does the patient have difficulty concentrating, remembering, or making decisions?: Yes  Permission Sought/Granted Permission sought to share information with : Case Manager Permission granted to share information with : Yes, Verbal Permission Granted     Permission granted to share info w AGENCY: [facility]        Emotional Assessment Appearance:: Appears stated age Attitude/Demeanor/Rapport: Suspicious Affect (typically observed): Apprehensive Orientation: : Oriented to Self, Oriented to Place, Oriented to  Time, Oriented to Situation Alcohol / Substance Use: Not Applicable Psych Involvement: No (comment)  Admission diagnosis:  Somnolence [R40.0] Patient Active Problem List   Diagnosis Date Noted   Polypharmacy 07/05/2023   Somnolence 07/04/2023   Hypoglycemia 03/24/2023   Aspiration pneumonia (HCC) 03/24/2023   Hypoxia 11/08/2022   Hypokalemia 11/05/2022   Acute pain of right shoulder due to trauma 04/25/2022   Cervical myelopathy (HCC) 11/02/2021   Disease of spinal cord, unspecified (HCC) 11/02/2021   Spinal stenosis in cervical region 11/02/2021   AMS (altered mental status) 07/18/2021   B12 deficiency 04/07/2021   Folate deficiency 04/07/2021   Protein-calorie malnutrition, moderate (HCC) 04/06/2021   Contracture of joint, lower leg 04/03/2021   Dupuytren's contracture of left hand 04/03/2021   UTI (urinary tract infection) 04/03/2021   Severe pain 04/03/2021   Intractable pain 04/02/2021   Pleural effusion 11/12/2020   Dyspnea on exertion 11/11/2020   Perforated appendicitis 11/02/2020   Chronic kidney disease, stage 3a (HCC) 11/02/2020  Colitis 06/23/2018   Coronary artery calcification 02/23/2017   Closed nondisplaced fracture of lateral malleolus of right fibula 10/28/2016   Noninfectious gastroenteritis    Hematochezia 09/01/2016   Acute diarrhea 08/28/2016   Hydronephrosis, right  06/29/2015   Hyperkalemia 06/29/2015   HCAP (healthcare-associated pneumonia) 06/29/2015   Peripheral neuropathy 06/29/2015   Candida infection, oral 06/28/2015   Normocytic anemia 06/10/2015   Hypoalbuminemia due to protein-calorie malnutrition (HCC) 06/10/2015   Ankle fracture, right 06/03/2015   Severe sepsis (HCC) 05/29/2015   Cervical spinal stenosis 05/12/2015   S/P cervical spinal fusion 04/17/2015   Acute cystitis without hematuria    Vomiting    Constipation 02/17/2015   CAP (community acquired pneumonia) 02/15/2015   Cocaine abuse with cocaine-induced disorder (HCC) 02/14/2015   Sepsis (HCC) 10/28/2014   SIRS (systemic inflammatory response syndrome) (HCC) 10/28/2014   Sore throat 10/28/2014   Acute encephalopathy 10/28/2014   Metabolic acidosis    Leukocytosis    Orthostatic hypotension 09/10/2014   Dehydration 09/10/2014   Chest pain at rest 09/09/2014   Tobacco abuse 09/09/2014   Mixed hyperlipidemia 09/09/2014   COPD (chronic obstructive pulmonary disease) (HCC) 09/09/2014   GERD without esophagitis 09/09/2014   Chest pain 08/20/2014   Nausea & vomiting 08/20/2014   Acute renal failure superimposed on stage 3 chronic kidney disease (HCC) 08/20/2014   Lower urinary tract infectious disease 08/20/2014   Pain in the chest    Pain 02/11/2013   Cocaine abuse (HCC) 02/11/2013   Rheumatoid arthritis flare (HCC) 02/11/2013   Acute renal failure (HCC) 02/11/2013   AKI (acute kidney injury) (HCC) 02/11/2013   Hiatal hernia with gastroesophageal reflux 10/11/2012   Abdominal mass 10/08/2012   Knee pain 10/08/2012   Essential hypertension 10/08/2012   Pancreatic mass 10/07/2012   Abdominal bloating 10/07/2012   Rheumatoid arthritis (HCC)    PCP:  Maree Leni Edyth DELENA, MD Pharmacy:   [facility] - [place], [place] - [address] [place] [address] Phone: [phone] Fax: [phone]  [id number] - [address] [place] -  [address] [place] [address] Phone: [phone] Fax: [phone]  [facility] [address] [place] [address] Phone: [phone] Fax: [phone]  [facility] - [facility] [address] [place] [address] Phone: [phone] Fax: [phone]     Social Drivers of Health (SDOH) Social History: SDOH Screenings   Food Insecurity: No Food Insecurity (07/04/2023)  Housing: Low Risk  (07/04/2023)  Transportation Needs: No Transportation Needs (07/04/2023)  Utilities: Not At Risk (07/04/2023)  Financial Resource Strain: High Risk (06/24/2018)  Social Connections: Moderately Integrated (07/04/2023)  Tobacco Use: High Risk (07/05/2023)   SDOH Interventions:     Readmission Risk Interventions    03/26/2023    3:15 PM 04/05/2021    5:04 PM 11/13/2020   10:26 AM  Readmission Risk Prevention Plan  Transportation Screening Complete Complete Complete  PCP or Specialist Appt within 3-5 Days Complete  Complete  HRI or Home Care Consult Complete  Complete  Social Work Consult for Recovery Care Planning/Counseling Complete  Complete  Palliative Care Screening Not Applicable  Complete  Medication Review Oceanographer) Complete Complete Complete  PCP or Specialist appointment within 3-5 days of discharge  Complete   HRI or Home Care Consult  Complete   SW Recovery Care/Counseling Consult  Complete   Palliative Care Screening  Complete   Skilled Nursing Facility  Complete

## 2023-07-05 NOTE — TOC Transition Note (Signed)
 Transition of Care [facility]) - Discharge Note   Patient Details  Name: Domingue Coltrain MRN: [id number] Date of Birth: Mar 13, 1955  Transition of Care [facility]) CM/SW Contact:  Doneta Glenys DASEN, RN Phone Number: 07/05/2023, 3:13 PM   Clinical Narrative:    Patient medically ready for discharge per MD. Patient offered choice for [facility]. Patient chose [facility]. Information faxed to [phone] and added to AVS. Patient stated has a rolling walker. TOC signing off.   Final next level of care: Home w Home Health Services Barriers to Discharge: Barriers Resolved   Patient Goals and CMS Choice   CMS Medicare.gov Compare Post Acute Care list provided to:: Patient Choice offered to / list presented to : Patient  ownership interest in [facility].provided to:: Parent NA    Discharge Placement                       Discharge Plan and Services Additional resources added to the After Visit Summary for   In-house Referral: NA Discharge Planning Services: CM Consult Post Acute Care Choice: NA          DME Arranged: N/A DME Agency: NA       HH Arranged: PT, OT HH Agency: [facility] Date HH Agency Contacted: 07/05/23 Time HH Agency Contacted: 1440 Representative spoke with at [facility] Agency: Faxed  Social Drivers of Health (SDOH) Interventions SDOH Screenings   Food Insecurity: No Food Insecurity (07/04/2023)  Housing: Low Risk  (07/04/2023)  Transportation Needs: No Transportation Needs (07/04/2023)  Utilities: Not At Risk (07/04/2023)  Financial Resource Strain: High Risk (06/24/2018)  Social Connections: Moderately Integrated (07/04/2023)  Tobacco Use: High Risk (07/05/2023)     Readmission Risk Interventions    03/26/2023    3:15 PM 04/05/2021    5:04 PM 11/13/2020   10:26 AM  Readmission Risk Prevention Plan  Transportation Screening Complete Complete Complete  PCP or Specialist Appt within 3-5 Days Complete  Complete  HRI or Home Care Consult Complete   Complete  Social Work Consult for Recovery Care Planning/Counseling Complete  Complete  Palliative Care Screening Not Applicable  Complete  Medication Review Oceanographer) Complete Complete Complete  PCP or Specialist appointment within 3-5 days of discharge  Complete   HRI or Home Care Consult  Complete   SW Recovery Care/Counseling Consult  Complete   Palliative Care Screening  Complete   Skilled Nursing Facility  Complete

## 2023-07-09 DIAGNOSIS — M8700 Idiopathic aseptic necrosis of unspecified bone: Secondary | ICD-10-CM | POA: Insufficient documentation

## 2023-08-18 ENCOUNTER — Encounter: Payer: Self-pay | Admitting: Neurosurgery

## 2023-11-07 ENCOUNTER — Encounter (HOSPITAL_COMMUNITY): Payer: Self-pay

## 2023-11-07 ENCOUNTER — Observation Stay (HOSPITAL_COMMUNITY)
Admission: EM | Admit: 2023-11-07 | Discharge: 2023-11-08 | Disposition: A | Discharge status: Home / Self Care | Attending: Internal Medicine | Admitting: Internal Medicine

## 2023-11-07 ENCOUNTER — Other Ambulatory Visit: Payer: Self-pay

## 2023-11-07 ENCOUNTER — Emergency Department (HOSPITAL_COMMUNITY)

## 2023-11-07 DIAGNOSIS — M069 Rheumatoid arthritis, unspecified: Secondary | ICD-10-CM | POA: Diagnosis not present

## 2023-11-07 DIAGNOSIS — B338 Other specified viral diseases: Principal | ICD-10-CM

## 2023-11-07 DIAGNOSIS — Z79899 Other long term (current) drug therapy: Secondary | ICD-10-CM | POA: Insufficient documentation

## 2023-11-07 DIAGNOSIS — J210 Acute bronchiolitis due to respiratory syncytial virus: Secondary | ICD-10-CM | POA: Diagnosis not present

## 2023-11-07 DIAGNOSIS — I129 Hypertensive chronic kidney disease with stage 1 through stage 4 chronic kidney disease, or unspecified chronic kidney disease: Secondary | ICD-10-CM | POA: Insufficient documentation

## 2023-11-07 DIAGNOSIS — G8929 Other chronic pain: Secondary | ICD-10-CM | POA: Insufficient documentation

## 2023-11-07 DIAGNOSIS — F1410 Cocaine abuse, uncomplicated: Secondary | ICD-10-CM | POA: Diagnosis not present

## 2023-11-07 DIAGNOSIS — N183 Chronic kidney disease, stage 3 unspecified: Secondary | ICD-10-CM | POA: Insufficient documentation

## 2023-11-07 DIAGNOSIS — F17210 Nicotine dependence, cigarettes, uncomplicated: Secondary | ICD-10-CM | POA: Insufficient documentation

## 2023-11-07 DIAGNOSIS — J441 Chronic obstructive pulmonary disease with (acute) exacerbation: Secondary | ICD-10-CM

## 2023-11-07 DIAGNOSIS — K118 Other diseases of salivary glands: Secondary | ICD-10-CM | POA: Insufficient documentation

## 2023-11-07 DIAGNOSIS — M87051 Idiopathic aseptic necrosis of right femur: Secondary | ICD-10-CM | POA: Insufficient documentation

## 2023-11-07 DIAGNOSIS — J449 Chronic obstructive pulmonary disease, unspecified: Secondary | ICD-10-CM | POA: Insufficient documentation

## 2023-11-07 DIAGNOSIS — K5900 Constipation, unspecified: Secondary | ICD-10-CM | POA: Diagnosis present

## 2023-11-07 LAB — URINALYSIS, ROUTINE W REFLEX MICROSCOPIC
Bilirubin Urine: NEGATIVE
Glucose, UA: NEGATIVE mg/dL
Ketones, ur: NEGATIVE mg/dL
Nitrite: NEGATIVE
Protein, ur: 100 mg/dL — AB
Specific Gravity, Urine: 1.011 (ref 1.005–1.030)
WBC, UA: >50 WBC/hpf (ref 0–5)
pH: 6 (ref 5.0–8.0)

## 2023-11-07 LAB — CBC
HCT: 36.6 % (ref 36.0–46.0)
Hemoglobin: 11.2 g/dL — ABNORMAL LOW (ref 12.0–15.0)
MCH: 25.7 pg — ABNORMAL LOW (ref 26.0–34.0)
MCHC: 30.6 g/dL (ref 30.0–36.0)
MCV: 83.9 fL (ref 80.0–100.0)
Platelets: 387 K/uL (ref 150–400)
RBC: 4.36 MIL/uL (ref 3.87–5.11)
RDW: 17.1 % — ABNORMAL HIGH (ref 11.5–15.5)
WBC: 10.7 K/uL — ABNORMAL HIGH (ref 4.0–10.5)
nRBC: 0 % (ref 0.0–0.2)

## 2023-11-07 LAB — BASIC METABOLIC PANEL WITH GFR
Anion gap: 15 (ref 5–15)
BUN: 21 mg/dL (ref 8–23)
CO2: 20 mmol/L — ABNORMAL LOW (ref 22–32)
Calcium: 9.3 mg/dL (ref 8.9–10.3)
Chloride: 104 mmol/L (ref 98–111)
Creatinine, Ser: 1.45 mg/dL — ABNORMAL HIGH (ref 0.44–1.00)
GFR, Estimated: 39 mL/min — ABNORMAL LOW (ref >60)
Glucose, Bld: 115 mg/dL — ABNORMAL HIGH (ref 70–99)
Potassium: 3.9 mmol/L (ref 3.5–5.1)
Sodium: 140 mmol/L (ref 135–145)

## 2023-11-07 LAB — TROPONIN T, HIGH SENSITIVITY
Troponin T High Sensitivity: <15 ng/L (ref 0–19)
Troponin T High Sensitivity: 15 ng/L (ref 0–19)

## 2023-11-07 LAB — URINE DRUG SCREEN
Amphetamines: NEGATIVE
Barbiturates: NEGATIVE
Benzodiazepines: NEGATIVE
Cocaine: POSITIVE — AB
Fentanyl: NEGATIVE
Methadone Scn, Ur: NEGATIVE
Opiates: NEGATIVE
Tetrahydrocannabinol: NEGATIVE

## 2023-11-07 LAB — I-STAT CG4 LACTIC ACID, ED
Lactic Acid, Venous: 1.8 mmol/L (ref 0.5–1.9)
Lactic Acid, Venous: 1.4 mmol/L (ref 0.5–1.9)

## 2023-11-07 LAB — RESP PANEL BY RT-PCR (RSV, FLU A&B, COVID)  RVPGX2
Influenza A by PCR: NEGATIVE
Influenza B by PCR: NEGATIVE
Resp Syncytial Virus by PCR: POSITIVE — AB
SARS Coronavirus 2 by RT PCR: NEGATIVE

## 2023-11-07 MED ORDER — BENZONATATE 100 MG PO CAPS
200.0000 mg | ORAL_CAPSULE | Freq: Three times a day (TID) | ORAL | Status: DC | PRN
Start: 2023-11-07 — End: 2023-11-08

## 2023-11-07 MED ORDER — SODIUM CHLORIDE 0.9% FLUSH
3.0000 mL | Freq: Two times a day (BID) | INTRAVENOUS | Status: DC
  Administered 2023-11-07: 3 mL via INTRAVENOUS

## 2023-11-07 MED ORDER — AMLODIPINE BESYLATE 10 MG PO TABS
10.0000 mg | ORAL_TABLET | Freq: Every day | ORAL | Status: DC
Start: 2023-11-07 — End: 2023-11-08
  Administered 2023-11-07 – 2023-11-08 (×2): 10 mg via ORAL
  Filled 2023-11-07 (×2): qty 1

## 2023-11-07 MED ORDER — PANTOPRAZOLE SODIUM 40 MG PO TBEC
40.0000 mg | DELAYED_RELEASE_TABLET | Freq: Every day | ORAL | Status: DC
  Administered 2023-11-07 – 2023-11-08 (×2): 40 mg via ORAL
  Filled 2023-11-07 (×2): qty 1

## 2023-11-07 MED ORDER — AMITRIPTYLINE HCL 25 MG PO TABS
50.0000 mg | ORAL_TABLET | Freq: Every day | ORAL | Status: DC
  Administered 2023-11-07: 50 mg via ORAL
  Filled 2023-11-07: qty 2

## 2023-11-07 MED ORDER — METHYLPREDNISOLONE SODIUM SUCC 125 MG IJ SOLR
125.0000 mg | Freq: Once | INTRAMUSCULAR | Status: AC
  Administered 2023-11-07: 125 mg via INTRAVENOUS
  Filled 2023-11-07: qty 2

## 2023-11-07 MED ORDER — IPRATROPIUM-ALBUTEROL 0.5-2.5 (3) MG/3ML IN SOLN
3.0000 mL | Freq: Once | RESPIRATORY_TRACT | Status: AC
  Administered 2023-11-07: 3 mL via RESPIRATORY_TRACT
  Filled 2023-11-07: qty 3

## 2023-11-07 MED ORDER — ACETAMINOPHEN 325 MG PO TABS
650.0000 mg | ORAL_TABLET | Freq: Once | ORAL | Status: AC
  Administered 2023-11-07: 650 mg via ORAL
  Filled 2023-11-07: qty 2

## 2023-11-07 MED ORDER — LIDOCAINE 5 % EX PTCH
1.0000 | MEDICATED_PATCH | CUTANEOUS | Status: DC
  Administered 2023-11-07: 1 via TRANSDERMAL
  Filled 2023-11-07: qty 1

## 2023-11-07 MED ORDER — BISACODYL 5 MG PO TBEC: 5.0000 mg | DELAYED_RELEASE_TABLET | Freq: Every day | ORAL | Status: DC | PRN

## 2023-11-07 MED ORDER — HYDROCODONE-ACETAMINOPHEN 5-325 MG PO TABS
1.0000 | ORAL_TABLET | Freq: Four times a day (QID) | ORAL | Status: AC | PRN
  Administered 2023-11-07 – 2023-11-08 (×2): 1 via ORAL
  Filled 2023-11-07 (×2): qty 1

## 2023-11-07 NOTE — ED Provider Notes (Signed)
 [place] EMERGENCY DEPARTMENT AT [facility] Provider Note   CSN: [id number] Arrival date & time: 11/07/23  1118     Patient presents with: Chest Pain   [name] is a 68 y.o. female patient with history of polysubstance abuse last used cocaine today who presents to the emergency department today for further evaluation of chest pain and cough.  She is also endorsing some shortness of breath.  She states she has been coughing up green mucus.  Chest pain radiates through to the back.  I reviewed the most recent CT abdomen pelvis roughly 1 year ago there was no evidence of abdominal aneurysm at that time. Reports subjective fever at home. No nausea, vomiting, or diarrhea.     Chest Pain      Prior to Admission medications   Medication Sig Start Date End Date Taking? Authorizing Provider  albuterol  (VENTOLIN  HFA) 108 (90 Base) MCG/ACT inhaler Inhale 1-2 puffs into the lungs every 6 (six) hours as needed for wheezing or shortness of breath. 06/05/22   Ladora Congress, PA  amitriptyline  (ELAVIL ) 50 MG tablet Take 50 mg by mouth at bedtime. 10/23/22   [provider]  amLODipine  (NORVASC ) 10 MG tablet Take 10 mg by mouth daily. 10/23/22   [provider]  ANORO ELLIPTA  62.5-25 MCG/ACT AEPB Inhale 1 puff into the lungs daily. 09/17/21   [provider]  atorvastatin  (LIPITOR) 80 MG tablet Take 80 mg by mouth at bedtime. 09/06/18   [provider]  baclofen  (LIORESAL ) 10 MG tablet Take 10 mg by mouth 2 (two) times daily.    [provider]  carvedilol  (COREG ) 6.25 MG tablet Take 6.25 mg by mouth 2 (two) times daily. 09/27/22   [provider]  cephALEXin  (KEFLEX ) 500 MG capsule Take 1 capsule (500 mg total) by mouth 4 (four) times daily. 04/27/23   Zammit, Joseph, MD  diclofenac  Sodium (VOLTAREN ) 1 % GEL Apply 2 g topically 4 (four) times daily as needed (joint pain). 07/18/21   Lou Claretta HERO, MD  DULoxetine  (CYMBALTA ) 60 MG capsule  Take 60 mg by mouth daily. 04/07/22   [provider]  gabapentin  (NEURONTIN ) 800 MG tablet Take 800 mg by mouth 3 (three) times daily.    [provider]  ibuprofen  (ADVIL ) 800 MG tablet Take 800 mg by mouth every 8 (eight) hours as needed for moderate pain (pain score 4-6). 10/23/22   [provider]  linaclotide  (LINZESS ) 145 MCG CAPS capsule Take 145 mcg by mouth every other day.    [provider]  naloxone  (NARCAN ) nasal spray 4 mg/0.1 mL Place 1 spray into the nose as needed (opioid overdose - respiratory distress).    [provider]  oxyCODONE -acetaminophen  (PERCOCET) 10-325 MG tablet Take 1 tablet by mouth every 6 (six) hours as needed for pain. 03/11/23   [provider]  pantoprazole  (PROTONIX ) 40 MG tablet Take 40 mg by mouth daily.    [provider]  senna-docusate (SENOKOT-S) 8.6-50 MG tablet Take 2 tablets by mouth at bedtime as needed for moderate constipation. 04/26/22   Amin, Ankit C, MD  TRELEGY ELLIPTA 100-62.5-25 MCG/ACT AEPB Take 1 puff by mouth every morning. 12/03/22   [provider]    Allergies: Iodinated contrast media, Levofloxacin , Nsaids, Zofran  [ondansetron  hcl], Buprenorphine, Heparin , and Morphine  and codeine    Review of Systems  Cardiovascular:  Positive for chest pain.  All other systems reviewed and are negative.   Updated Vital Signs BP ROLLEN)  159/73   Pulse 85   Temp 98.3 F (36.8 C) (Oral)   Resp 18   Ht 5' 6 (1.676 m)   Wt 78 kg   SpO2 94%   BMI 27.75 kg/m   Physical Exam Vitals and nursing note reviewed.  Constitutional:      General: She is not in acute distress.    Appearance: Normal appearance.  HENT:     Head: Normocephalic and atraumatic.  Eyes:     General:        Right eye: No discharge.        Left eye: No discharge.  Cardiovascular:     Comments: Regular rate and rhythm.  S1/S2 are distinct without any evidence of murmur, rubs, or gallops.  Radial pulses  are 2+ bilaterally.  Dorsalis pedis pulses are 2+ bilaterally.  No evidence of pedal edema. Pulmonary:     Comments: Clear to auscultation bilaterally.  Normal effort.  No respiratory distress.  No evidence of wheezes, rales, or rhonchi heard throughout. Abdominal:     General: Abdomen is flat. Bowel sounds are normal. There is no distension.     Tenderness: There is no abdominal tenderness. There is no guarding or rebound.  Musculoskeletal:        General: Normal range of motion.     Cervical back: Neck supple.  Skin:    General: Skin is warm and dry.     Findings: No rash.  Neurological:     General: No focal deficit present.     Mental Status: She is alert.  Psychiatric:        Mood and Affect: Mood normal.        Behavior: Behavior normal.     (all labs ordered are listed, but only abnormal results are displayed) Labs Reviewed  RESP PANEL BY RT-PCR (RSV, FLU A&B, COVID)  RVPGX2 - Abnormal; Notable for the following components:      Result Value   Resp Syncytial Virus by PCR POSITIVE (*)    All other components within normal limits  BASIC METABOLIC PANEL WITH GFR - Abnormal; Notable for the following components:   CO2 20 (*)    Glucose, Bld 115 (*)    Creatinine, Ser 1.45 (*)    GFR, Estimated 39 (*)    All other components within normal limits  CBC - Abnormal; Notable for the following components:   WBC 10.7 (*)    Hemoglobin 11.2 (*)    MCH 25.7 (*)    RDW 17.1 (*)    All other components within normal limits  I-STAT CG4 LACTIC ACID, ED  I-STAT CG4 LACTIC ACID, ED  TROPONIN T, HIGH SENSITIVITY  TROPONIN T, HIGH SENSITIVITY    EKG: EKG Interpretation Date/Time:  Monday November 07 2023 11:28:29 EDT Ventricular Rate:  96 PR Interval:  127 QRS Duration:  81 QT Interval:  367 QTC Calculation: 464 R Axis:   1  Text Interpretation: Sinus rhythm Probable left atrial enlargement Anteroseptal infarct, old Borderline repolarization abnormality Confirmed by Laurice Coy [phone]) on 11/07/2023 2:12:33 PM  Radiology: DG Chest 2 View Result Date: 11/07/2023 CLINICAL DATA:  Chest pain. EXAM: CHEST - 2 VIEW COMPARISON:  Radiographs 07/08/2023 and 03/24/2023. Chest CT 06/28/2023. FINDINGS: The heart size and mediastinal contours are stable with a large paraesophageal hiatal hernia. There are lower lung volumes with probable mildly increased atelectasis at both lung bases. No confluent airspace disease, pneumothorax or significant pleural effusion. The bones appear unchanged status post multilevel  cervical fusion. No acute osseous findings are evident. IMPRESSION: Lower lung volumes with probable mildly increased bibasilar atelectasis. No other significant changes. Electronically Signed   By: Elsie Perone M.D.   On: 11/07/2023 13:10     Procedures   Medications Ordered in the ED - No data to display  Clinical Course as of 11/07/23 1503  Mon Nov 07, 2023  1501 CBC(!) There is evidence of leukocytosis. [CF]  1501 Basic metabolic panel(!) Slight elevation in creatinine from baseline. [CF]  1501 I-Stat CG4 Lactic Acid Negative. [CF]  1501 Resp panel by RT-PCR (RSV, Flu A&B, Covid) Anterior Nasal Swab(!) Positive for RSV. [CF]  1501 Troponin T, High Sensitivity Troponin is negative. [CF]  1501 DG Chest 2 View No evidence of pneumonia.  I do agree with the radiologist interpretation. [CF]    Clinical Course User Index [CF] Theotis Cameron HERO, PA-C    Medical Decision Making Cicilia Clinger is a 69 y.o. female patient who presents to the emergency department today for further evaluation of chest pain and productive cough with green sputum.  Patient did use cocaine today so vasospasm certainly on differential.  Also concern for possible ACS versus pneumonia versus viral process.  Patient was briefly tachycardic but the rest of her vitals are otherwise normal.  Still waiting on delta troponin.  The rest of her labs were highlighted in the ED course.  Patient  will likely need to be ambulated with a pulse ox and if this reveals any obvious tachycardia, tachypnea, or drop in O2 sats patient should likely be admitted for further management.  Otherwise, patient can likely go home.  Due to the change, the patient's care will be transferred to oncoming provider.   Amount and/or Complexity of Data Reviewed Labs: ordered. Decision-making details documented in ED Course. Radiology: ordered. Decision-making details documented in ED Course.     Final diagnoses:  None    ED Discharge Orders     None          Theotis Cameron HERO, [place] 11/07/23 1503    Laurice Maude BROCKS, MD 11/08/23 [phone]

## 2023-11-07 NOTE — ED Triage Notes (Signed)
 Patient has had middle chest pain since Friday that moves into her upper back. Has been coughing up green mucus. Feels short of breath. Has COPD. Did cocaine today but said it made the pain worse.

## 2023-11-07 NOTE — ED Provider Notes (Signed)
 Patient's care assumed at 330.  Patient is wheelchair-bound.  Patient has a past medical history of rheumatoid arthritis kidney disease frequent UTIs, COPD, cocaine abuse, coronary artery disease.  Patient complains of cough productive for green phlegm and shortness of breath.  Patient complains of feeling weak overall.  Patient has a past medical history of COPD.  Patient has had an RSV test which is positive. I reevaluated patient she has wheezing in all lobes.  Patient is given a DuoNeb.  She reports some relief.  O2 sat at this time is 93%.  Chest x-ray shows mild increased bibasilar atelectasis.  Patient's temperature has increased to 100.3.  I will consult hospitalist for admission due to patient's debility weakness and shortness of breath secondary to RSV.   Lillis Nuttle K, PA-C 11/07/23 1631    Patsey Lot, MD 11/07/23 2303
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
## 2023-11-07 NOTE — ED Notes (Signed)
 Pt given cup of ice per MD

## 2023-11-08 DIAGNOSIS — J210 Acute bronchiolitis due to respiratory syncytial virus: Secondary | ICD-10-CM | POA: Diagnosis not present

## 2023-11-08 LAB — CBC
HCT: 32.6 % — ABNORMAL LOW (ref 36.0–46.0)
Hemoglobin: 10.1 g/dL — ABNORMAL LOW (ref 12.0–15.0)
MCH: 26 pg (ref 26.0–34.0)
MCHC: 31 g/dL (ref 30.0–36.0)
MCV: 84 fL (ref 80.0–100.0)
Platelets: 347 K/uL (ref 150–400)
RBC: 3.88 MIL/uL (ref 3.87–5.11)
RDW: 17.2 % — ABNORMAL HIGH (ref 11.5–15.5)
WBC: 7.7 K/uL (ref 4.0–10.5)
nRBC: 0 % (ref 0.0–0.2)

## 2023-11-08 LAB — BASIC METABOLIC PANEL WITH GFR
Anion gap: 9 (ref 5–15)
BUN: 24 mg/dL — ABNORMAL HIGH (ref 8–23)
CO2: 23 mmol/L (ref 22–32)
Calcium: 8.9 mg/dL (ref 8.9–10.3)
Chloride: 106 mmol/L (ref 98–111)
Creatinine, Ser: 1.06 mg/dL — ABNORMAL HIGH (ref 0.44–1.00)
GFR, Estimated: 57 mL/min — ABNORMAL LOW (ref >60)
Glucose, Bld: 140 mg/dL — ABNORMAL HIGH (ref 70–99)
Potassium: 4.5 mmol/L (ref 3.5–5.1)
Sodium: 138 mmol/L (ref 135–145)

## 2023-11-08 LAB — MAGNESIUM: Magnesium: 2 mg/dL (ref 1.7–2.4)

## 2023-11-08 MED ORDER — ALBUTEROL SULFATE HFA 108 (90 BASE) MCG/ACT IN AERS: 1.0000 | INHALATION_SPRAY | Freq: Four times a day (QID) | RESPIRATORY_TRACT | 0 refills | Status: AC | PRN

## 2023-11-08 MED ORDER — FENTANYL CITRATE (PF) 50 MCG/ML IJ SOSY: 12.5000 ug | PREFILLED_SYRINGE | Freq: Once | INTRAMUSCULAR | Status: DC

## 2023-11-08 MED ORDER — ORAL CARE MOUTH RINSE: 15.0000 mL | OROMUCOSAL | Status: DC | PRN

## 2023-11-08 MED ORDER — BENZONATATE 200 MG PO CAPS: 200.0000 mg | ORAL_CAPSULE | Freq: Three times a day (TID) | ORAL | 0 refills | Status: AC | PRN

## 2023-11-08 MED ORDER — DIPHENHYDRAMINE HCL 25 MG PO CAPS
25.0000 mg | ORAL_CAPSULE | Freq: Four times a day (QID) | ORAL | Status: DC | PRN
  Administered 2023-11-08: 25 mg via ORAL
  Filled 2023-11-08: qty 1

## 2023-11-08 MED ORDER — POLYETHYLENE GLYCOL 3350 17 G PO PACK
17.0000 g | PACK | Freq: Two times a day (BID) | ORAL | Status: DC
  Administered 2023-11-08: 17 g via ORAL
  Filled 2023-11-08: qty 1

## 2023-11-08 NOTE — Plan of Care (Signed)

## 2023-11-08 NOTE — Discharge Summary (Addendum)
 Physician Discharge Summary  [name] FMW:[id number] DOB: 1955-12-15 DOA: 11/07/2023  PCP: Maree Leni Edyth DELENA, MD  Admit date: 11/07/2023 Discharge date: 11/08/2023  Admitted From: Home Disposition:  Home  Recommendations for Outpatient Follow-up:  Follow up with PCP in 1-2 weeks Please obtain BMP/CBC in one week   Home Health:No Equipment/Devices:None  Discharge Condition:Stable CODE STATUS:Full Diet recommendation: Heart Healthy   Brief/Interim Summary:  68 y.o. female past medical history significant for COPD rheumatoid arthritis cocaine and polysubstance abuse presents to the hospital today complaining of chest pain coughing abdominal wall pain from all the coughing.  Relates his coughing has been progressively gotten worse, patient lives alone and is wheelchair-bound came in by ambulance Tmax in the ED was 100.3 saturations remained in above 88%   Discharge Diagnoses:  Principal Problem:   RSV (acute bronchiolitis due to respiratory syncytial virus) Active Problems:   Rheumatoid arthritis (HCC)   Cocaine abuse (HCC)   Constipation  RSV acute bronchiolitis: He was positive for RSV he was started on IV fluids given shot of steroids her oxygen  remained greater than 90%. She relates Tessalon  Perles have worked with the cough. And her narcotics have also help with the pain and cough. She was able to tolerate her diet this morning she was discharged in stable condition.  Rheumatoid arthritis: Noted.  Avascular necrosis of the right hip: Surgery delay until patient can be cocaine free.  Essential hypertension: Continue Norvasc .  Cocaine abuse: She is tested positive on UDS.  Parotid mass: Follow-up with ENT as an outpatient.  Chronic neck pain: Continue current regimen no changes made.  Constipation: Continue bowel regimen at home.  She was given MiraLAX  in the hospital.  Discharge Instructions  Discharge Instructions     Diet - low sodium heart  healthy   Complete by: As directed    Increase activity slowly   Complete by: As directed    No wound care   Complete by: As directed       Allergies as of 11/08/2023       Reactions   Iodinated Contrast Media Anaphylaxis   Iodine-131 Anaphylaxis, Other (See Comments)   Neck tightens up (Iodine I-131; it draws me up   Iohexol Anaphylaxis   Levofloxacin  Other (See Comments)   BASELINE PROLONGED QTc   Nsaids Other (See Comments)   CKD stage 3   Zofran  [ondansetron  Hcl] Other (See Comments)   BASELINE PROLONGED QTc.   Acetaminophen  Other (See Comments)   Chronic Kidney Disease   Buprenorphine-naloxone  Nausea Only   Eribulin Mesylate Other (See Comments)   Eribulin mesylate (brand name Halaven) is a chemotherapy drug used for metastatic breast cancer. Reaction ??   Heparin  (porcine) Itching   Morphine  Itching   Sulfamethoxazole -trimethoprim     Heparin  Itching, Other (See Comments)   Pt is able to tolerate IV heparin , but not sub-Q.   Morphine  And Codeine Itching   Penicillins Itching, Swelling, Other (See Comments)   Has patient had a PCN reaction causing immediate rash, facial/tongue/throat swelling, SOB or lightheadedness with hypotension: Yes        Medication List     TAKE these medications    albuterol  108 (90 Base) MCG/ACT inhaler Commonly known as: VENTOLIN  HFA Inhale 1-2 puffs into the lungs every 6 (six) hours as needed for wheezing or shortness of breath.   amitriptyline  50 MG tablet Commonly known as: ELAVIL  Take 50 mg by mouth at bedtime.   amLODipine  10 MG tablet Commonly known as: NORVASC  Take  10 mg by mouth daily.   Anoro Ellipta  62.5-25 MCG/ACT Aepb Generic drug: umeclidinium-vilanterol Inhale 1 puff into the lungs daily.   atorvastatin  80 MG tablet Commonly known as: LIPITOR Take 80 mg by mouth at bedtime.   baclofen  10 MG tablet Commonly known as: LIORESAL  Take 10 mg by mouth 2 (two) times daily.   benzonatate  200 MG  capsule Commonly known as: TESSALON  Take 1 capsule (200 mg total) by mouth 3 (three) times daily as needed for cough.   carvedilol  6.25 MG tablet Commonly known as: COREG  Take 6.25 mg by mouth 2 (two) times daily.   diclofenac  Sodium 1 % Gel Commonly known as: VOLTAREN  Apply 2 g topically 4 (four) times daily as needed (joint pain).   DULoxetine  60 MG capsule Commonly known as: CYMBALTA  Take 60 mg by mouth daily.   gabapentin  800 MG tablet Commonly known as: NEURONTIN  Take 800 mg by mouth 3 (three) times daily.   ibuprofen  800 MG tablet Commonly known as: ADVIL  Take 800 mg by mouth every 8 (eight) hours as needed for moderate pain (pain score 4-6).   linaclotide  145 MCG Caps capsule Commonly known as: LINZESS  Take 145 mcg by mouth every other day.   naloxone  4 MG/0.1ML Liqd nasal spray kit Commonly known as: NARCAN  Place 1 spray into the nose as needed (opioid overdose - respiratory distress).   oxyCODONE -acetaminophen  10-325 MG tablet Commonly known as: PERCOCET Take 1 tablet by mouth every 6 (six) hours as needed for pain.   pantoprazole  40 MG tablet Commonly known as: PROTONIX  Take 40 mg by mouth daily.   senna-docusate 8.6-50 MG tablet Commonly known as: Senokot-S Take 2 tablets by mouth at bedtime as needed for moderate constipation.   Trelegy Ellipta 100-62.5-25 MCG/ACT Aepb Generic drug: Fluticasone -Umeclidin-Vilant Take 1 puff by mouth every morning.        Allergies  Allergen Reactions   Iodinated Contrast Media Anaphylaxis   Iodine-131 Anaphylaxis and Other (See Comments)    Neck tightens up (Iodine I-131; it draws me up   Iohexol Anaphylaxis   Levofloxacin  Other (See Comments)    BASELINE PROLONGED QTc   Nsaids Other (See Comments)    CKD stage 3   Zofran  [Ondansetron  Hcl] Other (See Comments)    BASELINE PROLONGED QTc.   Acetaminophen  Other (See Comments)    Chronic Kidney Disease   Buprenorphine-Naloxone  Nausea Only   Eribulin Mesylate  Other (See Comments)    Eribulin mesylate (brand name Halaven) is a chemotherapy drug used for metastatic breast cancer. Reaction ??   Heparin  (Porcine) Itching   Morphine  Itching   Sulfamethoxazole -Trimethoprim     Heparin  Itching and Other (See Comments)    Pt is able to tolerate IV heparin , but not sub-Q.   Morphine  And Codeine Itching   Penicillins Itching, Swelling and Other (See Comments)    Has patient had a PCN reaction causing immediate rash, facial/tongue/throat swelling, SOB or lightheadedness with hypotension: Yes    Consultations: None   Procedures/Studies: DG Chest 2 View Result Date: 11/07/2023 CLINICAL DATA:  Chest pain. EXAM: CHEST - 2 VIEW COMPARISON:  Radiographs 07/08/2023 and 03/24/2023. Chest CT 06/28/2023. FINDINGS: The heart size and mediastinal contours are stable with a large paraesophageal hiatal hernia. There are lower lung volumes with probable mildly increased atelectasis at both lung bases. No confluent airspace disease, pneumothorax or significant pleural effusion. The bones appear unchanged status post multilevel cervical fusion. No acute osseous findings are evident. IMPRESSION: Lower lung volumes with probable mildly increased bibasilar  atelectasis. No other significant changes. Electronically Signed   By: Elsie Perone M.D.   On: 11/07/2023 13:10   (Echo, Carotid, EGD, Colonoscopy, ERCP)    Subjective: No complaints  Discharge Exam: Vitals:   11/08/23 0041 11/08/23 0510  BP: (!) 91/55 (!) 141/80  Pulse: 70 69  Resp: 18 20  Temp: 98.6 F (37 C) 98.2 F (36.8 C)  SpO2: 96% 100%   Vitals:   11/07/23 1617 11/07/23 2059 11/08/23 0041 11/08/23 0510  BP:  112/63 (!) 91/55 (!) 141/80  Pulse:  68 70 69  Resp:  20 18 20   Temp:  98.7 F (37.1 C) 98.6 F (37 C) 98.2 F (36.8 C)  TempSrc:      SpO2: 95% 95% 96% 100%  Weight:      Height:        General: Pt is alert, awake, not in acute distress Cardiovascular: RRR, S1/S2 +, no rubs, no  gallops Respiratory: CTA bilaterally, no wheezing, no rhonchi Abdominal: Soft, NT, ND, bowel sounds + Extremities: no edema, no cyanosis    The results of significant diagnostics from this hospitalization (including imaging, microbiology, ancillary and laboratory) are listed below for reference.     Microbiology: Recent Results (from the past 240 hours)  Resp panel by RT-PCR (RSV, Flu A&B, Covid) Anterior Nasal Swab     Status: Abnormal   Collection Time: 11/07/23  1:25 PM   Specimen: Anterior Nasal Swab  Result Value Ref Range Status   SARS Coronavirus 2 by RT PCR NEGATIVE NEGATIVE Final    Comment: (NOTE) SARS-CoV-2 target nucleic acids are NOT DETECTED.  The SARS-CoV-2 RNA is generally detectable in upper respiratory specimens during the acute phase of infection. The lowest concentration of SARS-CoV-2 viral copies this assay can detect is 138 copies/mL. A negative result does not preclude SARS-Cov-2 infection and should not be used as the sole basis for treatment or other patient management decisions. A negative result may occur with  improper specimen collection/handling, submission of specimen other than nasopharyngeal swab, presence of viral mutation(s) within the areas targeted by this assay, and inadequate number of viral copies(<138 copies/mL). A negative result must be combined with clinical observations, patient history, and epidemiological information. The expected result is Negative.  Fact Sheet for Patients:  bloggercourse.com  Fact Sheet for Healthcare Providers:  seriousbroker.it  This test is no t yet approved or cleared by the [place]  FDA and  has been authorized for detection and/or diagnosis of SARS-CoV-2 by FDA under an Emergency Use Authorization (EUA). This EUA will remain  in effect (meaning this test can be used) for the duration of the COVID-19 declaration under Section 564(b)(1) of the Act,  21 U.S.C.section 360bbb-3(b)(1), unless the authorization is terminated  or revoked sooner.       Influenza A by PCR NEGATIVE NEGATIVE Final   Influenza B by PCR NEGATIVE NEGATIVE Final    Comment: (NOTE) The Xpert Xpress SARS-CoV-2/FLU/RSV plus assay is intended as an aid in the diagnosis of influenza from Nasopharyngeal swab specimens and should not be used as a sole basis for treatment. Nasal washings and aspirates are unacceptable for Xpert Xpress SARS-CoV-2/FLU/RSV testing.  Fact Sheet for Patients: bloggercourse.com  Fact Sheet for Healthcare Providers: seriousbroker.it  This test is not yet approved or cleared by the [place]  FDA and has been authorized for detection and/or diagnosis of SARS-CoV-2 by FDA under an Emergency Use Authorization (EUA). This EUA will remain in effect (meaning this test can be used)  for the duration of the COVID-19 declaration under Section 564(b)(1) of the Act, 21 U.S.C. section 360bbb-3(b)(1), unless the authorization is terminated or revoked.     Resp Syncytial Virus by PCR POSITIVE (A) NEGATIVE Final    Comment: (NOTE) Fact Sheet for Patients: bloggercourse.com  Fact Sheet for Healthcare Providers: seriousbroker.it  This test is not yet approved or cleared by the [place]  FDA and has been authorized for detection and/or diagnosis of SARS-CoV-2 by FDA under an Emergency Use Authorization (EUA). This EUA will remain in effect (meaning this test can be used) for the duration of the COVID-19 declaration under Section 564(b)(1) of the Act, 21 U.S.C. section 360bbb-3(b)(1), unless the authorization is terminated or revoked.  Performed at [facility], [address] [place], [place] [address]      Labs: BNP (last 3 results) No results for input(s): BNP in the last 8760 hours. Basic Metabolic  Panel: Recent Labs  Lab 11/07/23 1329 11/08/23 0536  NA 140 138  K 3.9 4.5  CL 104 106  CO2 20* 23  GLUCOSE 115* 140*  BUN 21 24*  CREATININE 1.45* 1.06*  CALCIUM  9.3 8.9  MG  --  2.0   Liver Function Tests: No results for input(s): AST, ALT, ALKPHOS, BILITOT, PROT, ALBUMIN  in the last 168 hours. No results for input(s): LIPASE, AMYLASE in the last 168 hours. No results for input(s): AMMONIA in the last 168 hours. CBC: Recent Labs  Lab 11/07/23 1329 11/08/23 0536  WBC 10.7* 7.7  HGB 11.2* 10.1*  HCT 36.6 32.6*  MCV 83.9 84.0  PLT 387 347   Cardiac Enzymes: No results for input(s): CKTOTAL, CKMB, CKMBINDEX, TROPONINI in the last 168 hours. BNP: Invalid input(s): POCBNP CBG: No results for input(s): GLUCAP in the last 168 hours. D-Dimer No results for input(s): DDIMER in the last 72 hours. Hgb A1c No results for input(s): HGBA1C in the last 72 hours. Lipid Profile No results for input(s): CHOL, HDL, LDLCALC, TRIG, CHOLHDL, LDLDIRECT in the last 72 hours. Thyroid function studies No results for input(s): TSH, T4TOTAL, T3FREE, THYROIDAB in the last 72 hours.  Invalid input(s): FREET3 Anemia work up No results for input(s): VITAMINB12, FOLATE, FERRITIN, TIBC, IRON, RETICCTPCT in the last 72 hours. Urinalysis    Component Value Date/Time   COLORURINE YELLOW 11/07/2023 1532   APPEARANCEUR TURBID (A) 11/07/2023 1532   LABSPEC 1.011 11/07/2023 1532   PHURINE 6.0 11/07/2023 1532   GLUCOSEU NEGATIVE 11/07/2023 1532   HGBUR SMALL (A) 11/07/2023 1532   BILIRUBINUR NEGATIVE 11/07/2023 1532   KETONESUR NEGATIVE 11/07/2023 1532   PROTEINUR 100 (A) 11/07/2023 1532   UROBILINOGEN 0.2 03/06/2021 1417   NITRITE NEGATIVE 11/07/2023 1532   LEUKOCYTESUR MODERATE (A) 11/07/2023 1532   Sepsis Labs Recent Labs  Lab 11/07/23 1329 11/08/23 0536  WBC 10.7* 7.7   Microbiology Recent Results (from the past  240 hours)  Resp panel by RT-PCR (RSV, Flu A&B, Covid) Anterior Nasal Swab     Status: Abnormal   Collection Time: 11/07/23  1:25 PM   Specimen: Anterior Nasal Swab  Result Value Ref Range Status   SARS Coronavirus 2 by RT PCR NEGATIVE NEGATIVE Final    Comment: (NOTE) SARS-CoV-2 target nucleic acids are NOT DETECTED.  The SARS-CoV-2 RNA is generally detectable in upper respiratory specimens during the acute phase of infection. The lowest concentration of SARS-CoV-2 viral copies this assay can detect is 138 copies/mL. A negative result does not preclude SARS-Cov-2 infection and should not be  used as the sole basis for treatment or other patient management decisions. A negative result may occur with  improper specimen collection/handling, submission of specimen other than nasopharyngeal swab, presence of viral mutation(s) within the areas targeted by this assay, and inadequate number of viral copies(<138 copies/mL). A negative result must be combined with clinical observations, patient history, and epidemiological information. The expected result is Negative.  Fact Sheet for Patients:  bloggercourse.com  Fact Sheet for Healthcare Providers:  seriousbroker.it  This test is no t yet approved or cleared by the [place]  FDA and  has been authorized for detection and/or diagnosis of SARS-CoV-2 by FDA under an Emergency Use Authorization (EUA). This EUA will remain  in effect (meaning this test can be used) for the duration of the COVID-19 declaration under Section 564(b)(1) of the Act, 21 U.S.C.section 360bbb-3(b)(1), unless the authorization is terminated  or revoked sooner.       Influenza A by PCR NEGATIVE NEGATIVE Final   Influenza B by PCR NEGATIVE NEGATIVE Final    Comment: (NOTE) The Xpert Xpress SARS-CoV-2/FLU/RSV plus assay is intended as an aid in the diagnosis of influenza from Nasopharyngeal swab specimens  and should not be used as a sole basis for treatment. Nasal washings and aspirates are unacceptable for Xpert Xpress SARS-CoV-2/FLU/RSV testing.  Fact Sheet for Patients: bloggercourse.com  Fact Sheet for Healthcare Providers: seriousbroker.it  This test is not yet approved or cleared by the [place]  FDA and has been authorized for detection and/or diagnosis of SARS-CoV-2 by FDA under an Emergency Use Authorization (EUA). This EUA will remain in effect (meaning this test can be used) for the duration of the COVID-19 declaration under Section 564(b)(1) of the Act, 21 U.S.C. section 360bbb-3(b)(1), unless the authorization is terminated or revoked.     Resp Syncytial Virus by PCR POSITIVE (A) NEGATIVE Final    Comment: (NOTE) Fact Sheet for Patients: bloggercourse.com  Fact Sheet for Healthcare Providers: seriousbroker.it  This test is not yet approved or cleared by the [place]  FDA and has been authorized for detection and/or diagnosis of SARS-CoV-2 by FDA under an Emergency Use Authorization (EUA). This EUA will remain in effect (meaning this test can be used) for the duration of the COVID-19 declaration under Section 564(b)(1) of the Act, 21 U.S.C. section 360bbb-3(b)(1), unless the authorization is terminated or revoked.  Performed at [facility], [address] [place], [place] [address]      Time coordinating discharge: Over 35 minutes  SIGNED:   Erle Odell Castor, MD  [facility] 11/08/2023, 7:58 AM Pager   If 7PM-7AM, please contact night-coverage [email or web address] Password [email or web address]

## 2023-11-08 NOTE — Consult Note (Signed)
 WOC Nurse Consult Note: patient wheelchair bound per MD notes  Reason for Consult: pressure ulcer  Wound type: Stage 3 Pressure Injury L buttock 50% pink 50% tan  Pressure Injury POA: Yes Measurement: see nursing flowsheet  Wound bed: as above  Drainage (amount, consistency, odor) see nursing flowsheet  Periwound: intact  Dressing procedure/placement/frequency: Cleanse L buttock wound with Vashe wound cleanser, do not rinse and allow to air dry. Apply Xeroform gauze ([id number] [id number]) to wound bed daily and secure with silicone foam.   POC discussed with bedside nurse. Sophronia MICAEL Mages, RN assistance with this consult.   WOC team will not follow. Re-consult if further needs arise.   Thank you,    Powell Bar MSN, RN-BC, [employer]

## 2023-11-08 NOTE — TOC Progression Note (Addendum)
 Transition of Care [facility]) - Progression Note    Patient Details  Name: [name] MRN: [id number] Date of Birth: [birth year]  Transition of Care [facility]) CM/SW Contact  Heather DELENA Saltness, LCSW Phone Number: 11/08/2023, 10:41 AM  Clinical Narrative:     ADDENDUM  1:56 PM - CSW spoke with [facility] dispatch regarding transportation today. Per dispatch, they are still working on scheduling pt's transport. TOC will continue to follow.  CSW met with pt at bedside to discuss discharge planning. Pt reports needing assistance with transportation home. Pt reports she has electric wheelchair that she will need transported with her. CSW called [facility] [phone], to inquire about wheelchair transport. [facility] reports since it's such short notice, it may be scheduled later tonight or even tomorrow. [facility] reports they will try to accommodate. [facility] will call CSW with an update.    Expected Discharge Plan and Services         Expected Discharge Date: 11/08/23                                     Social Drivers of Health (SDOH) Interventions SDOH Screenings   Food Insecurity: No Food Insecurity (11/07/2023)  Housing: Low Risk  (11/07/2023)  Transportation Needs: Unmet Transportation Needs (11/07/2023)  Utilities: Not At Risk (11/07/2023)  Financial Resource Strain: High Risk (06/24/2018)  Social Connections: Moderately Isolated (11/07/2023)  Tobacco Use: High Risk (11/07/2023)    Readmission Risk Interventions    03/26/2023    3:15 PM 04/05/2021    5:04 PM  Readmission Risk Prevention Plan  Transportation Screening Complete Complete  PCP or Specialist Appt within 3-5 Days Complete   HRI or Home Care Consult Complete   Social Work Consult for Recovery Care Planning/Counseling Complete   Palliative Care Screening Not Applicable   Medication Review Oceanographer) Complete Complete  PCP or Specialist appointment within 3-5 days of  discharge  Complete  HRI or Home Care Consult  Complete  SW Recovery Care/Counseling Consult  Complete  Palliative Care Screening  Complete  Skilled Nursing Facility  Complete    Signed: Heather Saltness, MSW, LCSW Clinical Social Worker Inpatient Care Management 11/08/2023 10:44 AM

## 2023-11-08 NOTE — TOC Transition Note (Addendum)
 Transition of Care [facility]) - Discharge Note   Patient Details  Name: [name] MRN: [id number] Date of Birth: [birth year]  Transition of Care [facility]) CM/SW Contact:  Heather DELENA Saltness, LCSW Phone Number: 11/08/2023, 3:30 PM   Clinical Narrative:    Pt discharging home today. [facility] to transport pt in her motorized wheelchair. [employer] Waiver form signed by pt, placed in pt's chart at [employer] station. No further TOC needs at this time.   Final next level of care: Home/Self Care Barriers to Discharge: Barriers Resolved   Patient Goals and CMS Choice Patient states their goals for this hospitalization and ongoing recovery are:: To return home        Discharge Placement  Home              Patient to be transferred to facility by: [facility] Name of family member notified: Patient Patient and family notified of of transfer: 11/08/23  Discharge Plan and Services Additional resources added to the After Visit Summary for  Follow Up                DME Arranged: N/A DME Agency: NA       HH Arranged: NA HH Agency: NA        Social Drivers of Health (SDOH) Interventions SDOH Screenings   Food Insecurity: No Food Insecurity (11/07/2023)  Housing: Low Risk  (11/07/2023)  Transportation Needs: Unmet Transportation Needs (11/07/2023)  Utilities: Not At Risk (11/07/2023)  Financial Resource Strain: High Risk (06/24/2018)  Social Connections: Moderately Isolated (11/07/2023)  Tobacco Use: High Risk (11/07/2023)     Readmission Risk Interventions    03/26/2023    3:15 PM 04/05/2021    5:04 PM  Readmission Risk Prevention Plan  Transportation Screening Complete Complete  PCP or Specialist Appt within 3-5 Days Complete   HRI or Home Care Consult Complete   Social Work Consult for Recovery Care Planning/Counseling Complete   Palliative Care Screening Not Applicable   Medication Review Oceanographer) Complete Complete  PCP or Specialist appointment within 3-5  days of discharge  Complete  HRI or Home Care Consult  Complete  SW Recovery Care/Counseling Consult  Complete  Palliative Care Screening  Complete  Skilled Nursing Facility  Complete    Signed: Heather Saltness, MSW, LCSW Clinical Social Worker Inpatient Care Management 11/08/2023 3:32 PM

## 2023-11-08 NOTE — Care Management CC44 (Signed)
 Condition Code 44 Documentation Completed  Patient Details  Name: Hadasah Brugger MRN: [id number] Date of Birth: 1956-01-06   Condition Code 44 given:  Yes Patient signature on Condition Code 44 notice:  Yes Documentation of 2 MD's agreement:  Yes Code 44 added to claim:  Yes    Locklan Canoy A Kamala Kolton, LCSW 11/08/2023, 10:14 AM

## 2023-12-02 ENCOUNTER — Encounter (HOSPITAL_COMMUNITY): Payer: Self-pay

## 2023-12-02 ENCOUNTER — Emergency Department (HOSPITAL_COMMUNITY)
Admission: EM | Admit: 2023-12-02 | Discharge: 2023-12-03 | Disposition: A | Discharge status: Home / Self Care | Attending: Emergency Medicine | Admitting: Emergency Medicine

## 2023-12-02 ENCOUNTER — Emergency Department (HOSPITAL_COMMUNITY)

## 2023-12-02 ENCOUNTER — Other Ambulatory Visit: Payer: Self-pay

## 2023-12-02 DIAGNOSIS — I10 Essential (primary) hypertension: Secondary | ICD-10-CM | POA: Diagnosis not present

## 2023-12-02 DIAGNOSIS — W19XXXA Unspecified fall, initial encounter: Secondary | ICD-10-CM | POA: Diagnosis not present

## 2023-12-02 DIAGNOSIS — T07XXXA Unspecified multiple injuries, initial encounter: Secondary | ICD-10-CM

## 2023-12-02 DIAGNOSIS — M79661 Pain in right lower leg: Secondary | ICD-10-CM | POA: Diagnosis present

## 2023-12-02 DIAGNOSIS — R519 Headache, unspecified: Secondary | ICD-10-CM | POA: Diagnosis not present

## 2023-12-02 DIAGNOSIS — M25561 Pain in right knee: Secondary | ICD-10-CM | POA: Diagnosis not present

## 2023-12-02 DIAGNOSIS — H538 Other visual disturbances: Secondary | ICD-10-CM | POA: Diagnosis not present

## 2023-12-02 DIAGNOSIS — H579 Unspecified disorder of eye and adnexa: Secondary | ICD-10-CM

## 2023-12-02 DIAGNOSIS — J449 Chronic obstructive pulmonary disease, unspecified: Secondary | ICD-10-CM | POA: Insufficient documentation

## 2023-12-02 DIAGNOSIS — M25551 Pain in right hip: Secondary | ICD-10-CM | POA: Insufficient documentation

## 2023-12-02 DIAGNOSIS — M542 Cervicalgia: Secondary | ICD-10-CM | POA: Insufficient documentation

## 2023-12-02 DIAGNOSIS — M25571 Pain in right ankle and joints of right foot: Secondary | ICD-10-CM | POA: Insufficient documentation

## 2023-12-02 DIAGNOSIS — H5330 Unspecified disorder of binocular vision: Secondary | ICD-10-CM | POA: Insufficient documentation

## 2023-12-02 DIAGNOSIS — Z79899 Other long term (current) drug therapy: Secondary | ICD-10-CM | POA: Insufficient documentation

## 2023-12-02 LAB — CBC WITH DIFFERENTIAL/PLATELET
Abs Immature Granulocytes: 0.04 K/uL (ref 0.00–0.07)
Basophils Absolute: 0.1 K/uL (ref 0.0–0.1)
Basophils Relative: 1 %
Eosinophils Absolute: 0.5 K/uL (ref 0.0–0.5)
Eosinophils Relative: 5 %
HCT: 35.6 % — ABNORMAL LOW (ref 36.0–46.0)
Hemoglobin: 10.7 g/dL — ABNORMAL LOW (ref 12.0–15.0)
Immature Granulocytes: 0 %
Lymphocytes Relative: 26 %
Lymphs Abs: 2.8 K/uL (ref 0.7–4.0)
MCH: 25.1 pg — ABNORMAL LOW (ref 26.0–34.0)
MCHC: 30.1 g/dL (ref 30.0–36.0)
MCV: 83.4 fL (ref 80.0–100.0)
Monocytes Absolute: 0.3 K/uL (ref 0.1–1.0)
Monocytes Relative: 3 %
Neutro Abs: 7.1 K/uL (ref 1.7–7.7)
Neutrophils Relative %: 65 %
Platelets: 471 K/uL — ABNORMAL HIGH (ref 150–400)
RBC: 4.27 MIL/uL (ref 3.87–5.11)
RDW: 17.2 % — ABNORMAL HIGH (ref 11.5–15.5)
WBC: 10.9 K/uL — ABNORMAL HIGH (ref 4.0–10.5)
nRBC: 0 % (ref 0.0–0.2)

## 2023-12-02 LAB — BASIC METABOLIC PANEL WITH GFR
Anion gap: 12 (ref 5–15)
BUN: 21 mg/dL (ref 8–23)
CO2: 20 mmol/L — ABNORMAL LOW (ref 22–32)
Calcium: 9.4 mg/dL (ref 8.9–10.3)
Chloride: 108 mmol/L (ref 98–111)
Creatinine, Ser: 1.27 mg/dL — ABNORMAL HIGH (ref 0.44–1.00)
GFR, Estimated: 46 mL/min — ABNORMAL LOW (ref >60)
Glucose, Bld: 85 mg/dL (ref 70–99)
Potassium: 3.9 mmol/L (ref 3.5–5.1)
Sodium: 141 mmol/L (ref 135–145)

## 2023-12-02 NOTE — ED Notes (Signed)
 Pt triaged under this account via RN Ozell Molt

## 2023-12-02 NOTE — ED Triage Notes (Addendum)
 BIBA from ome, fell about 24h ago, call EMS for head hurting more than usual, right sided fiace and neck pain.  Rerports neck pain, knee pain, vomiting, states vision is going dark,   Hx UTI, cataracts   150/78 65 P 22 RR 96 O2 114 CBG

## 2023-12-03 ENCOUNTER — Emergency Department (HOSPITAL_COMMUNITY)

## 2023-12-03 MED ORDER — OXYCODONE-ACETAMINOPHEN 5-325 MG PO TABS
1.0000 | ORAL_TABLET | Freq: Once | ORAL | Status: AC
  Administered 2023-12-03: 1 via ORAL
  Filled 2023-12-03: qty 1

## 2023-12-03 NOTE — Discharge Instructions (Addendum)
 We saw you in the ER after you had a fall. All the imaging results are normal, no fractures seen. No evidence of brain bleed. Please be very careful with walking, and do everything possible to prevent falls.

## 2023-12-03 NOTE — ED Notes (Signed)
 Vision Screening  Both Eyes 20/200 Left Eye    20/200 Right Eye  20/50

## 2023-12-03 NOTE — ED Notes (Signed)
 Called [facility] for pt to transport home. Pt uses a wheel chair at home.  Pt paperwork given to pt for discharge.  Waiting on transport

## 2023-12-03 NOTE — ED Provider Notes (Signed)
 [place] [facility]MENT AT [facility] Provider Note   CSN: [id number] Arrival date & time: 12/02/23  1921     Patient presents with: [place]   [name] is a 68 y.o. female.   HPI     68 year old patient comes in with chief complaint of fall. Patient fell more than 24 hours ago.  Patient typically is not able to walk because of chronic pain.  She states that she got up, lost her footing and fell.  In the process, she is having pain over her right lower extremity, head and neck.  Patient also reports that she now has blurry vision, binocular, worse on the right side.  It appears to her that there is a film on the top half of her eye.  Patient does not have any chest pain, back pain.   Patient has past medical history significant for COPD, rheumatoid arthritis, hypertension, and cocaine/polysubstance abuse who presents to the hospital today feeling miserable.    Prior to Admission medications   Medication Sig Start Date End Date Taking? Authorizing Provider  albuterol  (VENTOLIN  HFA) 108 (90 Base) MCG/ACT inhaler Inhale 1-2 puffs into the lungs every 6 (six) hours as needed for wheezing or shortness of breath. 11/08/23   Odell Celinda Balo, MD  amitriptyline  (ELAVIL ) 50 MG tablet Take 50 mg by mouth at bedtime. 10/23/22   [provider]  amLODipine  (NORVASC ) 10 MG tablet Take 10 mg by mouth daily. 10/23/22   [provider]  ANORO ELLIPTA  62.5-25 MCG/ACT AEPB Inhale 1 puff into the lungs daily. 09/17/21   [provider]  atorvastatin  (LIPITOR) 80 MG tablet Take 80 mg by mouth at bedtime. 09/06/18   [provider]  baclofen  (LIORESAL ) 10 MG tablet Take 10 mg by mouth 2 (two) times daily.    [provider]  benzonatate  (TESSALON ) 200 MG capsule Take 1 capsule (200 mg total) by mouth 3 (three) times daily as needed for cough. 11/08/23   Odell Celinda Balo, MD  carvedilol  (COREG ) 6.25 MG tablet Take 6.25 mg by mouth 2  (two) times daily. 09/27/22   [provider]  diclofenac  Sodium (VOLTAREN ) 1 % GEL Apply 2 g topically 4 (four) times daily as needed (joint pain). 07/18/21   Lou Claretta HERO, MD  DULoxetine  (CYMBALTA ) 60 MG capsule Take 60 mg by mouth daily. 04/07/22   [provider]  gabapentin  (NEURONTIN ) 800 MG tablet Take 800 mg by mouth 3 (three) times daily.    [provider]  ibuprofen  (ADVIL ) 800 MG tablet Take 800 mg by mouth every 8 (eight) hours as needed for moderate pain (pain score 4-6). 10/23/22   [provider]  linaclotide  (LINZESS ) 145 MCG CAPS capsule Take 145 mcg by mouth every other day.    [provider]  naloxone  (NARCAN ) nasal spray 4 mg/0.1 mL Place 1 spray into the nose as needed (opioid overdose - respiratory distress).    [provider]  oxyCODONE -acetaminophen  (PERCOCET) 10-325 MG tablet Take 1 tablet by mouth every 6 (six) hours as needed for pain. 03/11/23   [provider]  pantoprazole  (PROTONIX ) 40 MG tablet Take 40 mg by mouth daily.    [provider]  senna-docusate (SENOKOT-S) 8.6-50 MG tablet Take 2 tablets by mouth at bedtime as needed for moderate constipation. 04/26/22   Amin, Hanalei Glace C, MD  TRELEGY ELLIPTA 100-62.5-25 MCG/ACT AEPB Take 1 puff by mouth every morning. 12/03/22   [provider]    Allergies:  Iodinated contrast media, Iodine-131, Iohexol, Levofloxacin , Nsaids, Zofran  [ondansetron  hcl], Acetaminophen , Buprenorphine-naloxone , Eribulin mesylate, Heparin  (porcine), Morphine , Sulfamethoxazole -trimethoprim , Heparin , Morphine  and codeine, and Penicillins    Review of Systems  All other systems reviewed and are negative.   Updated Vital Signs BP (!) 155/68   Pulse (!) 57   Temp 98.3 F (36.8 C) (Oral)   Resp 16   Ht 5' 6 (1.676 m)   Wt 77.6 kg   SpO2 94%   BMI 27.60 kg/m   Physical Exam Vitals and nursing note reviewed.  Constitutional:      Appearance: She is  well-developed.  HENT:     Head: Atraumatic.  Eyes:     Extraocular Movements: Extraocular movements intact.     Pupils: Pupils are equal, round, and reactive to light.     Comments: Bedside finger counting is normal.  Patient has no nystagmus.  Cardiovascular:     Rate and Rhythm: Normal rate.  Pulmonary:     Effort: Pulmonary effort is normal.  Musculoskeletal:     Cervical back: Normal range of motion and neck supple.     Comments: Patient has tenderness over the right hip, right lower extremity including knee and the ankle  Skin:    General: Skin is warm and dry.  Neurological:     Mental Status: She is alert and oriented to person, place, and time.     Cranial Nerves: No cranial nerve deficit.     Sensory: No sensory deficit.     Comments: Upper extremity strength is 4+ out of 5 bilaterally, cerebellar exam reveals no dysmetria     (all labs ordered are listed, but only abnormal results are displayed) Labs Reviewed  BASIC METABOLIC PANEL WITH GFR - Abnormal; Notable for the following components:      Result Value   CO2 20 (*)    Creatinine, Ser 1.27 (*)    GFR, Estimated 46 (*)    All other components within normal limits  CBC WITH DIFFERENTIAL/PLATELET - Abnormal; Notable for the following components:   WBC 10.9 (*)    Hemoglobin 10.7 (*)    HCT 35.6 (*)    MCH 25.1 (*)    RDW 17.2 (*)    Platelets 471 (*)    All other components within normal limits    EKG: None  Radiology: MR BRAIN WO CONTRAST Result Date: 12/03/2023 EXAM: MRI BRAIN WITHOUT CONTRAST 12/03/2023 10:56:28 AM TECHNIQUE: Multiplanar multisequence MRI of the head/brain was performed without the administration of intravenous contrast. Intermittent mild motion artifact, including on axial DWI. Good quality coronal DWI. COMPARISON: CT head 12/02/2023. Previous brain MRI 07/12/2023. Comparison neck CT 11/17/2021. CLINICAL HISTORY: 68 year old female upper field vision loss. FINDINGS: BRAIN AND  VENTRICLES: No acute infarct. No intracranial hemorrhage. No mass. No midline shift. No hydrocephalus. Patchy periventricular, deep white matter capsule, and right basal ganglia T2 and FLAIR heterogeneity is stable. Stable mild T2 heterogeneity in the pons. No chronic cerebral blood products on SWI. No cortical encephalomalacia identified. Brain volume is normal for age. The sella is unremarkable. Normal flow voids. ORBITS: Orbits appear stable and negative. SINUSES AND MASTOIDS: Paranasal sinuses and mastoids remain well aerated. BONES AND SOFT TISSUES: Normal marrow signal. No acute soft tissue abnormality. Chronic cervical ACD of hardware with mild hardware artifact. Chronic degenerative ligamentous hypertrophy at the odontoid. Chronic parotid space lesions, dominant lesion on the left is partially visible on series 16 image 18, not significantly changed from neck CT in 2023  compatible with benign primary salivary neoplasm. IMPRESSION: 1. No acute intracranial abnormality. 2. Stable chronic signal changes compatible with moderate for age small vessel disease. 3. 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 Electronically signed by: Helayne Hurst MD 12/03/2023 11:11 AM EST RP Workstation: [id number]   DG Knee Complete 4 Views Right Result Date: 12/02/2023 EXAM: 4 VIEW(S) XRAY OF THE RIGHT KNEE 12/02/2023 08:40:00 PM COMPARISON: None available. CLINICAL HISTORY: fall fall FINDINGS: BONES AND JOINTS: No acute fracture. No focal osseous lesion. No joint dislocation. Small joint effusion. Advanced tricompartmental degenerative changes, most pronounced in the lateral compartment. SOFT TISSUES: Vascular calcifications noted. IMPRESSION: 1. No acute bony abnormality Electronically signed by: Franky Crease MD 12/02/2023 08:51 PM EST RP Workstation: [id number]   DG Hip Unilat W or Wo Pelvis 2-3 Views Right Result Date: 12/02/2023 EXAM: 2 or 3 view(s) Xray of the right hip 12/02/2023  08:40:00 PM COMPARISON: None available. CLINICAL HISTORY: Fall Fall FINDINGS: BONES AND JOINTS: No acute fracture or focal osseous lesion. The hip joint is maintained. SOFT TISSUES: The soft tissues are unremarkable. IMPRESSION: 1. No significant abnormality in the right hip or visualized pelvis. Electronically signed by: Franky Crease MD 12/02/2023 08:50 PM EST RP Workstation: [id number]   DG Ankle Complete Right Result Date: 12/02/2023 EXAM: 3 or more view(s) Xray of the right ankle 12/02/2023 08:40:00 PM CLINICAL HISTORY: fall fall COMPARISON: None available. FINDINGS: BONES AND JOINTS: No acute fracture. No focal osseous lesion. No joint dislocation. SOFT TISSUES: The soft tissues are unremarkable. IMPRESSION: 1. No acute abnormality. Electronically signed by: Franky Crease MD 12/02/2023 08:49 PM EST RP Workstation: [id number]   CT Cervical Spine Wo Contrast Result Date: 12/02/2023 EXAM: CT CERVICAL SPINE WITHOUT CONTRAST 12/02/2023 08:13:55 PM TECHNIQUE: CT of the cervical spine was performed without the administration of intravenous contrast. Multiplanar reformatted images are provided for review. Automated exposure control, iterative reconstruction, and/or weight based adjustment of the mA/kV was utilized to reduce the radiation dose to as low as reasonably achievable. COMPARISON: 07/08/2023 CLINICAL HISTORY: Neck trauma (Age >= 65y) FINDINGS: CERVICAL SPINE: BONES AND ALIGNMENT: No acute fracture or traumatic malalignment. DEGENERATIVE CHANGES: Changes of anterior fusion at C3-C4 and C5-C7. Degenerative changes above and below the fusion levels as well as at C4-C5 (level between the 2 anterior fusion plates). SOFT TISSUES: No prevertebral soft tissue swelling. IMPRESSION: 1. No acute abnormality of the cervical spine. 2. Changes of anterior fusion at C3-4 and C5-C7. Electronically signed by: Franky Crease MD 12/02/2023 08:19 PM EST RP Workstation: [id number]   CT Head Wo Contrast Result Date:  12/02/2023 EXAM: CT HEAD WITHOUT CONTRAST 12/02/2023 08:13:55 PM TECHNIQUE: CT of the head was performed without the administration of intravenous contrast. Automated exposure control, iterative reconstruction, and/or weight based adjustment of the mA/kV was utilized to reduce the radiation dose to as low as reasonably achievable. COMPARISON: 07/10/2023 CLINICAL HISTORY: Head trauma, minor (Age >= 65y). FINDINGS: BRAIN AND VENTRICLES: No acute hemorrhage. No evidence of acute infarct. No hydrocephalus. No extra-axial collection. No mass effect or midline shift. ORBITS: No acute abnormality. SINUSES: No acute abnormality. SOFT TISSUES AND SKULL: No acute soft tissue abnormality. No skull fracture. IMPRESSION: 1. No acute intracranial abnormality. Electronically signed by: Franky Crease MD 12/02/2023 08:17 PM EST RP Workstation: [id number]     Procedures   Medications Ordered in the ED  oxyCODONE -acetaminophen  (PERCOCET/ROXICET) 5-325 MG per tablet 1 tablet (1 tablet Oral Given 12/03/23 0325)  Medical Decision Making Amount and/or Complexity of Data Reviewed Labs: ordered. Radiology: ordered.  Risk Prescription drug management.    68year old patient comes in after sustaining what appears to be a mechanical fall.  She also reports visual disturbance since then.  Last known normal > 24 hours ago.  Pertinent past medical includes polysubstance use disorder, CKD Collateral history provided by reviewing the patient's records including recent discharge summary.  Patient was admitted for hypoxia.  Cocaine positive at that time.  Based on my history and exam, differential diagnosis includes: - Traumatic brain injury including intracranial hemorrhage - Long bone fractures - Contusions - Soft tissue injury - Concussion - Cervical spine injury - Vertebral dissection - Stroke  Based on the initial assessment, the following workup was initiated CT scan of the  head, CT cervical spine and radiographs of the lower extremity.  With her visual disturbance, MRI of the brain has also been ordered.  I have independently interpreted the following imaging from the perspective of acute trauma: CT scan of the brain and the results indicate no evidence of brain bleed.  On reassessment, patient continues to complain of visual disturbance.  MRI brain has been ordered.  Her care has been signed out to incoming team.     Final diagnoses:  Fall, initial encounter  Multiple contusions  Visual symptoms    ED Discharge Orders     None          Charlyn Sora, MD 12/03/23 1454

## 2023-12-03 NOTE — ED Provider Notes (Incomplete)
 [place] [facility]GENC[facility] DEPARTMENT AT [facility] Provider Note   CSN: [id number] Arrival date & time: 12/02/23  1921     Patient presents with: [place]   [name] is a 68 y.o. female.  {Add pertinent medical, surgical, social history, OB history to HPI:32947} HPI     68 year old patient comes in with chief complaint of fall. Patient fell more than 24 hours ago.  Patient typically is not able to walk because of chronic pain.  She states that she got up, lost her footing and fell.  In the process, she is having pain over her right lower extremity, head and neck.  Prior to Admission medications   Medication Sig Start Date End Date Taking? Authorizing Provider  albuterol  (VENTOLIN  HFA) 108 (90 Base) MCG/ACT inhaler Inhale 1-2 puffs into the lungs every 6 (six) hours as needed for wheezing or shortness of breath. 11/08/23   Odell Celinda Balo, MD  amitriptyline  (ELAVIL ) 50 MG tablet Take 50 mg by mouth at bedtime. 10/23/22   [provider]  amLODipine  (NORVASC ) 10 MG tablet Take 10 mg by mouth daily. 10/23/22   [provider]  ANORO ELLIPTA  62.5-25 MCG/ACT AEPB Inhale 1 puff into the lungs daily. 09/17/21   [provider]  atorvastatin  (LIPITOR) 80 MG tablet Take 80 mg by mouth at bedtime. 09/06/18   [provider]  baclofen  (LIORESAL ) 10 MG tablet Take 10 mg by mouth 2 (two) times daily.    [provider]  benzonatate  (TESSALON ) 200 MG capsule Take 1 capsule (200 mg total) by mouth 3 (three) times daily as needed for cough. 11/08/23   Odell Celinda Balo, MD  carvedilol  (COREG ) 6.25 MG tablet Take 6.25 mg by mouth 2 (two) times daily. 09/27/22   [provider]  diclofenac  Sodium (VOLTAREN ) 1 % GEL Apply 2 g topically 4 (four) times daily as needed (joint pain). 07/18/21   Lou Claretta HERO, MD  DULoxetine  (CYMBALTA ) 60 MG capsule Take 60 mg by mouth daily. 04/07/22   [provider]  gabapentin  (NEURONTIN )  800 MG tablet Take 800 mg by mouth 3 (three) times daily.    [provider]  ibuprofen  (ADVIL ) 800 MG tablet Take 800 mg by mouth every 8 (eight) hours as needed for moderate pain (pain score 4-6). 10/23/22   [provider]  linaclotide  (LINZESS ) 145 MCG CAPS capsule Take 145 mcg by mouth every other day.    [provider]  naloxone  (NARCAN ) nasal spray 4 mg/0.1 mL Place 1 spray into the nose as needed (opioid overdose - respiratory distress).    [provider]  oxyCODONE -acetaminophen  (PERCOCET) 10-325 MG tablet Take 1 tablet by mouth every 6 (six) hours as needed for pain. 03/11/23   [provider]  pantoprazole  (PROTONIX ) 40 MG tablet Take 40 mg by mouth daily.    [provider]  senna-docusate (SENOKOT-S) 8.6-50 MG tablet Take 2 tablets by mouth at bedtime as needed for moderate constipation. 04/26/22   Amin, Alliana Mcauliff C, MD  TRELEGY ELLIPTA 100-62.5-25 MCG/ACT AEPB Take 1 puff by mouth every morning. 12/03/22   [provider]    Allergies: Iodinated contrast media, Iodine-131, Iohexol, Levofloxacin , Nsaids, Zofran  [ondansetron  hcl], Acetaminophen , Buprenorphine-naloxone , Eribulin mesylate, Heparin  (porcine), Morphine , Sulfamethoxazole -trimethoprim , Heparin , Morphine  and codeine, and Penicillins    Review of Systems  Updated Vital Signs BP 129/87   Pulse 63   Temp 98.1 F (36.7 C) (Oral)   Resp 16   Ht 5' 6 (1.676 m)  Wt 77.6 kg   SpO2 91%   BMI 27.60 kg/m   Physical Exam  (all labs ordered are listed, but only abnormal results are displayed) Labs Reviewed  BASIC METABOLIC PANEL WITH GFR - Abnormal; Notable for the following components:      Result Value   CO2 20 (*)    Creatinine, Ser 1.27 (*)    GFR, Estimated 46 (*)    All other components within normal limits  CBC WITH DIFFERENTIAL/PLATELET - Abnormal; Notable for the following components:   WBC 10.9 (*)    Hemoglobin 10.7 (*)    HCT 35.6 (*)    MCH 25.1  (*)    RDW 17.2 (*)    Platelets 471 (*)    All other components within normal limits    EKG: None  Radiology: DG Knee Complete 4 Views Right Result Date: 12/02/2023 EXAM: 4 VIEW(S) XRAY OF THE RIGHT KNEE 12/02/2023 08:40:00 PM COMPARISON: None available. CLINICAL HISTORY: fall fall FINDINGS: BONES AND JOINTS: No acute fracture. No focal osseous lesion. No joint dislocation. Small joint effusion. Advanced tricompartmental degenerative changes, most pronounced in the lateral compartment. SOFT TISSUES: Vascular calcifications noted. IMPRESSION: 1. No acute bony abnormality Electronically signed by: Franky Crease MD 12/02/2023 08:51 PM EST RP Workstation: [id number]   DG Hip Unilat W or Wo Pelvis 2-3 Views Right Result Date: 12/02/2023 EXAM: 2 or 3 view(s) Xray of the right hip 12/02/2023 08:40:00 PM COMPARISON: None available. CLINICAL HISTORY: Fall Fall FINDINGS: BONES AND JOINTS: No acute fracture or focal osseous lesion. The hip joint is maintained. SOFT TISSUES: The soft tissues are unremarkable. IMPRESSION: 1. No significant abnormality in the right hip or visualized pelvis. Electronically signed by: Franky Crease MD 12/02/2023 08:50 PM EST RP Workstation: [id number]   DG Ankle Complete Right Result Date: 12/02/2023 EXAM: 3 or more view(s) Xray of the right ankle 12/02/2023 08:40:00 PM CLINICAL HISTORY: fall fall COMPARISON: None available. FINDINGS: BONES AND JOINTS: No acute fracture. No focal osseous lesion. No joint dislocation. SOFT TISSUES: The soft tissues are unremarkable. IMPRESSION: 1. No acute abnormality. Electronically signed by: Franky Crease MD 12/02/2023 08:49 PM EST RP Workstation: [id number]   CT Cervical Spine Wo Contrast Result Date: 12/02/2023 EXAM: CT CERVICAL SPINE WITHOUT CONTRAST 12/02/2023 08:13:55 PM TECHNIQUE: CT of the cervical spine was performed without the administration of intravenous contrast. Multiplanar reformatted images are provided for review. Automated  exposure control, iterative reconstruction, and/or weight based adjustment of the mA/kV was utilized to reduce the radiation dose to as low as reasonably achievable. COMPARISON: 07/08/2023 CLINICAL HISTORY: Neck trauma (Age >= 65y) FINDINGS: CERVICAL SPINE: BONES AND ALIGNMENT: No acute fracture or traumatic malalignment. DEGENERATIVE CHANGES: Changes of anterior fusion at C3-C4 and C5-C7. Degenerative changes above and below the fusion levels as well as at C4-C5 (level between the 2 anterior fusion plates). SOFT TISSUES: No prevertebral soft tissue swelling. IMPRESSION: 1. No acute abnormality of the cervical spine. 2. Changes of anterior fusion at C3-4 and C5-C7. Electronically signed by: Franky Crease MD 12/02/2023 08:19 PM EST RP Workstation: [id number]   CT Head Wo Contrast Result Date: 12/02/2023 EXAM: CT HEAD WITHOUT CONTRAST 12/02/2023 08:13:55 PM TECHNIQUE: CT of the head was performed without the administration of intravenous contrast. Automated exposure control, iterative reconstruction, and/or weight based adjustment of the mA/kV was utilized to reduce the radiation dose to as low as reasonably achievable. COMPARISON: 07/10/2023 CLINICAL HISTORY: Head trauma, minor (Age >= 65y). FINDINGS: BRAIN AND VENTRICLES: No acute hemorrhage. No evidence of  acute infarct. No hydrocephalus. No extra-axial collection. No mass effect or midline shift. ORBITS: No acute abnormality. SINUSES: No acute abnormality. SOFT TISSUES AND SKULL: No acute soft tissue abnormality. No skull fracture. IMPRESSION: 1. No acute intracranial abnormality. Electronically signed by: Franky Crease MD 12/02/2023 08:17 PM EST RP Workstation: [id number]    {Document cardiac monitor, telemetry assessment procedure when appropriate:32947} Procedures   Medications Ordered in the ED - No data to display    {Click here for ABCD2, HEART and other calculators REFRESH Note before signing:1}                              Medical Decision  Making Amount and/or Complexity of Data Reviewed Labs: ordered. Radiology: ordered.   ***  {Document critical care time when appropriate  Document review of labs and clinical decision tools ie CHADS2VASC2, etc  Document your independent review of radiology images and any outside records  Document your discussion with family members, caretakers and with consultants  Document social determinants of health affecting pt's care  Document your decision making why or why not admission, treatments were needed:32947:::1}   Final diagnoses:  Fall, initial encounter  Multiple contusions    ED Discharge Orders     None

## 2023-12-03 NOTE — ED Provider Notes (Signed)
 Care assumed at 2300.  Patient here for evaluation following a fall.  Care assumed pending MRI due to vision changes.  Patient care transferred pending MRI.   Griselda Norris, MD 12/03/23 [phone]

## 2023-12-03 NOTE — ED Notes (Signed)
 Assumed care of patient. Patient changed in to a  gown. Patient was readjusted in bed and requested pan medication. Swallow screen competed to ensure patient is able to swallow without difficulty.

## 2023-12-03 NOTE — ED Provider Notes (Signed)
 Signed out that pt presented after a mechanical fall and is waiting for an MRI, if MRI neg to d/c to home.   MRI is neg for acute process.   Vitals stable, no distress.   Pt appears stable for ED d/c.      Bernard Drivers, MD 12/03/23 [phone]

## 2024-02-11 ENCOUNTER — Other Ambulatory Visit: Payer: Self-pay

## 2024-02-11 ENCOUNTER — Emergency Department (HOSPITAL_COMMUNITY)

## 2024-02-11 ENCOUNTER — Encounter (HOSPITAL_COMMUNITY): Payer: Self-pay | Admitting: Emergency Medicine

## 2024-02-11 ENCOUNTER — Emergency Department (HOSPITAL_COMMUNITY)
Admission: EM | Admit: 2024-02-11 | Discharge: 2024-02-11 | Disposition: A | Discharge status: Home / Self Care | Attending: Emergency Medicine | Admitting: Emergency Medicine

## 2024-02-11 DIAGNOSIS — N189 Chronic kidney disease, unspecified: Secondary | ICD-10-CM | POA: Insufficient documentation

## 2024-02-11 DIAGNOSIS — J449 Chronic obstructive pulmonary disease, unspecified: Secondary | ICD-10-CM | POA: Insufficient documentation

## 2024-02-11 DIAGNOSIS — R197 Diarrhea, unspecified: Secondary | ICD-10-CM | POA: Insufficient documentation

## 2024-02-11 DIAGNOSIS — R079 Chest pain, unspecified: Secondary | ICD-10-CM | POA: Insufficient documentation

## 2024-02-11 DIAGNOSIS — R32 Unspecified urinary incontinence: Secondary | ICD-10-CM

## 2024-02-11 DIAGNOSIS — R159 Full incontinence of feces: Secondary | ICD-10-CM | POA: Insufficient documentation

## 2024-02-11 DIAGNOSIS — R109 Unspecified abdominal pain: Secondary | ICD-10-CM | POA: Insufficient documentation

## 2024-02-11 DIAGNOSIS — I6523 Occlusion and stenosis of bilateral carotid arteries: Secondary | ICD-10-CM | POA: Insufficient documentation

## 2024-02-11 DIAGNOSIS — R0602 Shortness of breath: Secondary | ICD-10-CM | POA: Insufficient documentation

## 2024-02-11 DIAGNOSIS — M47812 Spondylosis without myelopathy or radiculopathy, cervical region: Secondary | ICD-10-CM | POA: Insufficient documentation

## 2024-02-11 DIAGNOSIS — J9811 Atelectasis: Secondary | ICD-10-CM | POA: Insufficient documentation

## 2024-02-11 DIAGNOSIS — M069 Rheumatoid arthritis, unspecified: Secondary | ICD-10-CM | POA: Insufficient documentation

## 2024-02-11 DIAGNOSIS — Z87442 Personal history of urinary calculi: Secondary | ICD-10-CM | POA: Insufficient documentation

## 2024-02-11 DIAGNOSIS — G8929 Other chronic pain: Secondary | ICD-10-CM

## 2024-02-11 DIAGNOSIS — M5127 Other intervertebral disc displacement, lumbosacral region: Secondary | ICD-10-CM | POA: Insufficient documentation

## 2024-02-11 DIAGNOSIS — G319 Degenerative disease of nervous system, unspecified: Secondary | ICD-10-CM | POA: Insufficient documentation

## 2024-02-11 DIAGNOSIS — J984 Other disorders of lung: Secondary | ICD-10-CM | POA: Insufficient documentation

## 2024-02-11 DIAGNOSIS — M8440XA Pathological fracture, unspecified site, initial encounter for fracture: Secondary | ICD-10-CM | POA: Insufficient documentation

## 2024-02-11 DIAGNOSIS — I700 Atherosclerosis of aorta: Secondary | ICD-10-CM | POA: Insufficient documentation

## 2024-02-11 DIAGNOSIS — K449 Diaphragmatic hernia without obstruction or gangrene: Secondary | ICD-10-CM | POA: Insufficient documentation

## 2024-02-11 DIAGNOSIS — N3000 Acute cystitis without hematuria: Secondary | ICD-10-CM | POA: Insufficient documentation

## 2024-02-11 DIAGNOSIS — I129 Hypertensive chronic kidney disease with stage 1 through stage 4 chronic kidney disease, or unspecified chronic kidney disease: Secondary | ICD-10-CM | POA: Insufficient documentation

## 2024-02-11 DIAGNOSIS — R9082 White matter disease, unspecified: Secondary | ICD-10-CM | POA: Insufficient documentation

## 2024-02-11 DIAGNOSIS — R1024 Suprapubic pain: Secondary | ICD-10-CM

## 2024-02-11 DIAGNOSIS — W1839XA Other fall on same level, initial encounter: Secondary | ICD-10-CM | POA: Insufficient documentation

## 2024-02-11 DIAGNOSIS — W19XXXA Unspecified fall, initial encounter: Secondary | ICD-10-CM

## 2024-02-11 DIAGNOSIS — M5126 Other intervertebral disc displacement, lumbar region: Secondary | ICD-10-CM | POA: Insufficient documentation

## 2024-02-11 LAB — COMPREHENSIVE METABOLIC PANEL WITH GFR
ALT: <5 U/L (ref 0–44)
AST: 20 U/L (ref 15–41)
Albumin: 3.8 g/dL (ref 3.5–5.0)
Alkaline Phosphatase: 113 U/L (ref 38–126)
Anion gap: 10 (ref 5–15)
BUN: 17 mg/dL (ref 8–23)
CO2: 21 mmol/L — ABNORMAL LOW (ref 22–32)
Calcium: 9 mg/dL (ref 8.9–10.3)
Chloride: 109 mmol/L (ref 98–111)
Creatinine, Ser: 1.05 mg/dL — ABNORMAL HIGH (ref 0.44–1.00)
GFR, Estimated: 57 mL/min — ABNORMAL LOW
Glucose, Bld: 102 mg/dL — ABNORMAL HIGH (ref 70–99)
Potassium: 5.1 mmol/L (ref 3.5–5.1)
Sodium: 139 mmol/L (ref 135–145)
Total Bilirubin: <0.2 mg/dL (ref 0.0–1.2)
Total Protein: 7.4 g/dL (ref 6.5–8.1)

## 2024-02-11 LAB — URINE DRUG SCREEN
Amphetamines: NEGATIVE
Barbiturates: NEGATIVE
Benzodiazepines: NEGATIVE
Cocaine: NEGATIVE
Fentanyl: NEGATIVE
Methadone Scn, Ur: NEGATIVE
Opiates: NEGATIVE
Tetrahydrocannabinol: NEGATIVE

## 2024-02-11 LAB — URINALYSIS, ROUTINE W REFLEX MICROSCOPIC
Bilirubin Urine: NEGATIVE
Glucose, UA: NEGATIVE mg/dL
Hgb urine dipstick: NEGATIVE
Ketones, ur: NEGATIVE mg/dL
Nitrite: NEGATIVE
Protein, ur: 30 mg/dL — AB
Specific Gravity, Urine: 1.018 (ref 1.005–1.030)
WBC, UA: >50 WBC/hpf (ref 0–5)
pH: 5 (ref 5.0–8.0)

## 2024-02-11 LAB — CBC
HCT: 36.8 % (ref 36.0–46.0)
Hemoglobin: 11.4 g/dL — ABNORMAL LOW (ref 12.0–15.0)
MCH: 26.6 pg (ref 26.0–34.0)
MCHC: 31 g/dL (ref 30.0–36.0)
MCV: 86 fL (ref 80.0–100.0)
Platelets: 331 10*3/uL (ref 150–400)
RBC: 4.28 MIL/uL (ref 3.87–5.11)
RDW: 18.6 % — ABNORMAL HIGH (ref 11.5–15.5)
WBC: 7.4 10*3/uL (ref 4.0–10.5)
nRBC: 0 % (ref 0.0–0.2)

## 2024-02-11 LAB — TROPONIN T, HIGH SENSITIVITY: Troponin T High Sensitivity: 11 ng/L (ref 0–19)

## 2024-02-11 LAB — LIPASE, BLOOD: Lipase: 17 U/L (ref 11–51)

## 2024-02-11 MED ORDER — CEPHALEXIN 500 MG PO CAPS: 500.0000 mg | ORAL_CAPSULE | Freq: Two times a day (BID) | ORAL | 0 refills | Status: AC

## 2024-02-11 MED ORDER — LORAZEPAM 2 MG/ML IJ SOLN: 1.0000 mg | Freq: Once | INTRAMUSCULAR | Status: DC | PRN

## 2024-02-11 MED ORDER — SODIUM CHLORIDE 0.9 % IV SOLN
2.0000 g | Freq: Once | INTRAVENOUS | Status: AC
  Administered 2024-02-11: 2 g via INTRAVENOUS
  Filled 2024-02-11: qty 20

## 2024-02-11 MED ORDER — DIPHENHYDRAMINE HCL 50 MG/ML IJ SOLN
25.0000 mg | Freq: Once | INTRAMUSCULAR | Status: AC
  Administered 2024-02-11: 25 mg via INTRAVENOUS
  Filled 2024-02-11: qty 1

## 2024-02-11 NOTE — ED Triage Notes (Addendum)
 Patient presents from home due to lower mid abdominal pain that has worsened over a 2 week period. She does admit to cocaine use around the time symptoms began. She is also concerned about kidney issues and diarrhea. Patient rates pain 10/10. She denies nausea and vomiting. A&O 4  Patient reports difficulty controlling bowel and bladder.   EMS vitals: 150/80 BP 66 HR 138 CBG 97% SPO2 on room air 98.0 Temp

## 2024-02-11 NOTE — ED Notes (Signed)
 Patient transported to MRI

## 2024-02-11 NOTE — ED Provider Notes (Signed)
 " [place] EMERGENCY DEPARTMENT AT [facility] Provider Note   CSN: [id number] Arrival date & time: 02/11/24  1448     Patient presents with: Abdominal Pain   [name] is a 69 y.o. female.  Abdominal Pain Associated symptoms: chest pain, diarrhea and shortness of breath   Patient is a 69 year old female with medical history of MRSA, GERD, cocaine abuse, CKD, COPD, HTN, kidney stones, rheumatoid arthritis.  Status post hysterectomy, appendectomy, anterior cervical decompression and discectomy.   Presenting ED today for concerns for multiple complaints.  Most significantly notes that she has had 2 weeks of intermittent suprapubic abdominal pain that is radiating to the rest of her abdomen, noting that she has had similar pains over several months but has been more frequent as of recent.   Additionally noting that she had fallen 2 weeks ago out of bed, hitting her head and low back on the ground.  Since then she has noted increased urinary incontinence and fecal incontinence, noting that she normally has a little incontinence but not to this degree reporting that she has more and my pull-up then  when I urinate.  Not on blood thinners, did not lose consciousness, not ambulatory at baseline, noted to be wheelchair-bound.  Additionally she has some mild chest pain with some mild shortness of breath, noting the reason that she came in today was because she was anxious because this pain in her abdomen and chest which are normally intermittent and have been more constant.  Endorses watery diarrhea  Denies fever, headache, blurry vision, vertigo, cough, congestion, hemoptysis, unilateral weakness, nausea, vomiting, dysuria, hematuria, vaginal bleeding/discharge, rashes, lower leg swelling.     Prior to Admission medications  Medication Sig Start Date End Date Taking? Authorizing Provider  cephALEXin  (KEFLEX ) 500 MG capsule Take 1 capsule (500 mg total) by mouth 2 (two) times  daily for 7 days. 02/11/24 02/18/24 Yes Arien Benincasa S, PA-C  albuterol  (VENTOLIN  HFA) 108 (90 Base) MCG/ACT inhaler Inhale 1-2 puffs into the lungs every 6 (six) hours as needed for wheezing or shortness of breath. 11/08/23   Odell Celinda Balo, MD  amitriptyline  (ELAVIL ) 50 MG tablet Take 50 mg by mouth at bedtime. 10/23/22   [provider]  amLODipine  (NORVASC ) 10 MG tablet Take 10 mg by mouth daily. 10/23/22   [provider]  ANORO ELLIPTA  62.5-25 MCG/ACT AEPB Inhale 1 puff into the lungs daily. 09/17/21   [provider]  atorvastatin  (LIPITOR) 80 MG tablet Take 80 mg by mouth at bedtime. 09/06/18   [provider]  baclofen  (LIORESAL ) 10 MG tablet Take 10 mg by mouth 2 (two) times daily.    [provider]  benzonatate  (TESSALON ) 200 MG capsule Take 1 capsule (200 mg total) by mouth 3 (three) times daily as needed for cough. 11/08/23   Odell Celinda Balo, MD  carvedilol  (COREG ) 6.25 MG tablet Take 6.25 mg by mouth 2 (two) times daily. 09/27/22   [provider]  diclofenac  Sodium (VOLTAREN ) 1 % GEL Apply 2 g topically 4 (four) times daily as needed (joint pain). 07/18/21   Lou Claretta HERO, MD  DULoxetine  (CYMBALTA ) 60 MG capsule Take 60 mg by mouth daily. 04/07/22   [provider]  gabapentin  (NEURONTIN ) 800 MG tablet Take 800 mg by mouth 3 (three) times daily.    [provider]  ibuprofen  (ADVIL ) 800 MG tablet Take 800 mg by mouth every 8 (eight) hours as needed for moderate pain (pain score 4-6). 10/23/22  [provider]  linaclotide  (LINZESS ) 145 MCG CAPS capsule Take 145 mcg by mouth every other day.    [provider]  naloxone  (NARCAN ) nasal spray 4 mg/0.1 mL Place 1 spray into the nose as needed (opioid overdose - respiratory distress).    [provider]  oxyCODONE -acetaminophen  (PERCOCET) 10-325 MG tablet Take 1 tablet by mouth every 6 (six) hours as needed for pain. 03/11/23    [provider]  pantoprazole  (PROTONIX ) 40 MG tablet Take 40 mg by mouth daily.    [provider]  senna-docusate (SENOKOT-S) 8.6-50 MG tablet Take 2 tablets by mouth at bedtime as needed for moderate constipation. 04/26/22   Amin, Ankit C, MD  TRELEGY ELLIPTA 100-62.5-25 MCG/ACT AEPB Take 1 puff by mouth every morning. 12/03/22   [provider]    Allergies: Iodinated contrast media, Iodine-131, Iohexol, Levofloxacin , Nsaids, Zofran  [ondansetron  hcl], Acetaminophen , Buprenorphine-naloxone , Eribulin mesylate, Heparin  (porcine), Morphine , Sulfamethoxazole -trimethoprim , Heparin , Morphine  and codeine, and Penicillins    Review of Systems  Respiratory:  Positive for shortness of breath.   Cardiovascular:  Positive for chest pain.  Gastrointestinal:  Positive for abdominal pain and diarrhea.       Fecal incontinence  Genitourinary:        Urinary incontinence  All other systems reviewed and are negative.   Updated Vital Signs BP (!) 145/62 (BP Location: Left Arm)   Pulse 74   Temp 98.5 F (36.9 C) (Oral)   Resp 18   SpO2 97%   Physical Exam Vitals and nursing note reviewed.  Constitutional:      General: She is not in acute distress.    Appearance: Normal appearance. She is not ill-appearing or diaphoretic.  HENT:     Head: Normocephalic and atraumatic.  Eyes:     General: No scleral icterus.       Right eye: No discharge.        Left eye: No discharge.     Extraocular Movements: Extraocular movements intact.     Conjunctiva/sclera: Conjunctivae normal.     Pupils: Pupils are equal, round, and reactive to light.  Cardiovascular:     Rate and Rhythm: Regular rhythm. Bradycardia present.     Pulses: Normal pulses.     Heart sounds: Normal heart sounds. No murmur heard.    No friction rub. No gallop.  Pulmonary:     Effort: Pulmonary effort is normal. No respiratory distress.     Breath sounds: No stridor. No wheezing, rhonchi or rales.  Chest:      Chest wall: Tenderness (To left side of chest wall without any signs of bruising or ecchymosis or lesions.) present.  Abdominal:     General: Abdomen is flat. A surgical scar is present. There is no distension.     Palpations: Abdomen is soft.     Tenderness: There is generalized abdominal tenderness and tenderness in the right lower quadrant, suprapubic area and left lower quadrant. There is no right CVA tenderness, left CVA tenderness, guarding or rebound. Negative signs include Murphy's sign.  Musculoskeletal:        General: Tenderness (Notably tender midline to lumbar spine and left paraspinal muscle region.) present. No swelling, deformity or signs of injury.     Cervical back: Normal range of motion. No rigidity or tenderness.     Right lower leg: No edema.     Left lower leg: No edema.  Skin:    General: Skin is warm and dry.     Findings:  No bruising, erythema or lesion.  Neurological:     General: No focal deficit present.     Mental Status: She is alert and oriented to person, place, and time. Mental status is at baseline.     Sensory: No sensory deficit.     Motor: No weakness.  Psychiatric:        Mood and Affect: Mood normal.     (all labs ordered are listed, but only abnormal results are displayed) Labs Reviewed  COMPREHENSIVE METABOLIC PANEL WITH GFR - Abnormal; Notable for the following components:      Result Value   CO2 21 (*)    Glucose, Bld 102 (*)    Creatinine, Ser 1.05 (*)    GFR, Estimated 57 (*)    All other components within normal limits  CBC - Abnormal; Notable for the following components:   Hemoglobin 11.4 (*)    RDW 18.6 (*)    All other components within normal limits  URINALYSIS, ROUTINE W REFLEX MICROSCOPIC - Abnormal; Notable for the following components:   APPearance HAZY (*)    Protein, ur 30 (*)    Leukocytes,Ua LARGE (*)    Bacteria, UA MANY (*)    All other components within normal limits  URINE CULTURE  LIPASE, BLOOD  URINE DRUG  SCREEN  TROPONIN T, HIGH SENSITIVITY    EKG: None  Radiology: MR LUMBAR SPINE WO CONTRAST Result Date: 02/11/2024 EXAM: MRI LUMBAR SPINE 02/11/2024 04:49:00 PM TECHNIQUE: Multiplanar multisequence MRI of the lumbar spine was performed without the administration of intravenous contrast. COMPARISON: CT of the lumbar spine 02/11/2024. CLINICAL HISTORY: Low back pain, cauda equina syndrome suspected. FINDINGS: BONES AND ALIGNMENT: Rightward curvature is centered at L3. Stable endplate sclerotic changes are present on the left endplate. Type 3 Modic changes are present on the left at L2-L3 and L3-L4 and on the right at L4-L5 and L5-S1. Slight retrolisthesis is stable at L1-L2 and L2-L3. Normal vertebral body heights. Bone marrow signal is unremarkable. SPINAL CORD: The conus terminates normally. SOFT TISSUES: No paraspinal mass. L1-L2: Leftward disc bulge and moderate bilateral facet hypertrophy is present. No significant stenosis is present. L2-L3: A leftward disc protrusion results in mild left foraminal narrowing. L3-L4: A leftward disc protrusion and moderate bilateral facet hypertrophy results in mild foraminal narrowing, left greater than right. L4-L5: Rightward disc protrusion and asymmetric right sided facet hypertrophy lead to moderate right foraminal stenosis. L5-S1: B. road based disc protrusion and asymmetric right sided facet hypertrophy contribute to moderate right foraminal narrowing. IMPRESSION: 1. No evidence of cauda equina syndrome. 2. Moderate right foraminal stenosis at L4-L5 due to rightward disc protrusion and asymmetric right sided facet hypertrophy. 3. Moderate right foraminal narrowing at L5-S1 due to broad based disc protrusion and asymmetric right sided facet hypertrophy. Electronically signed by: Lonni Necessary MD 02/11/2024 05:06 PM EST RP Workstation: [id number]   CT ABDOMEN PELVIS WO CONTRAST Result Date: 02/11/2024 CLINICAL DATA:  Abdominal pain, acute, nonlocalized. Left  lower quadrant abdominal pain with worsening over 2 weeks. EXAM: CT ABDOMEN AND PELVIS WITHOUT CONTRAST CT LUMBAR SPINE WITHOUT CONTRAST TECHNIQUE: Multidetector CT imaging of the abdomen and pelvis was performed following the standard protocol without IV contrast. RADIATION DOSE REDUCTION: This exam was performed according to the departmental dose-optimization program which includes automated exposure control, adjustment of the mA and/or kV according to patient size and/or use of iterative reconstruction technique. COMPARISON:  07/04/2023. FINDINGS: Lower chest: Heart is normal in size and there is a trace pericardial  effusion. Scattered coronary artery calcifications are present. Atelectasis is present at the lung bases. There is a large hiatal hernia. Hepatobiliary: No focal liver abnormality is seen. No gallstones, gallbladder wall thickening, or biliary dilatation. Pancreas: Unremarkable. No pancreatic ductal dilatation or surrounding inflammatory changes. Spleen: Normal in size without focal abnormality. Adrenals/Urinary Tract: The adrenal glands are within normal limits. A cyst is noted in the left kidney. No renal calculus or hydronephrosis bilaterally. Bladder is within normal limits. Stomach/Bowel: There is a large hiatal hernia in the stomach is completely intrathoracic in location. No bowel obstruction, free air, or pneumatosis is seen. Appendix is surgically absent. A few scattered diverticula are present along the colon without evidence of diverticulitis. Vascular/Lymphatic: Aortic atherosclerosis. No enlarged abdominal or pelvic lymph nodes. Reproductive: Status post hysterectomy. No adnexal masses. Other: No abdominopelvic ascites. Surgical changes are noted in the anterior abdominal wall. Musculoskeletal: Degenerative changes are present in the lumbar spine. Radiopaque densities are present in the inferior aspect of the site of the sacrum on the left, suggesting shrapnel. CT lumbar spine findings:  Alignment: Dextroscoliosis is noted. Alignment is otherwise within normal limits. Vertebral bodies: No acute fracture is seen. Disc spaces: There stable moderate multilevel degenerative disc disease with intervertebral disc space narrowing and degenerative endplate changes. Paraspinous soft tissues no acute abnormality is seen. IMPRESSION: 1. No acute intra-abdominal process. 2. Multilevel degenerative changes in the lumbar spine without evidence of acute fracture. 3. Large hiatal hernia. 4. Coronary artery calcifications. 5. Aortic atherosclerosis. Electronically Signed   By: Leita Birmingham M.D.   On: 02/11/2024 16:22   CT L-SPINE NO CHARGE Result Date: 02/11/2024 CLINICAL DATA:  Abdominal pain, acute, nonlocalized. Left lower quadrant abdominal pain with worsening over 2 weeks. EXAM: CT ABDOMEN AND PELVIS WITHOUT CONTRAST CT LUMBAR SPINE WITHOUT CONTRAST TECHNIQUE: Multidetector CT imaging of the abdomen and pelvis was performed following the standard protocol without IV contrast. RADIATION DOSE REDUCTION: This exam was performed according to the departmental dose-optimization program which includes automated exposure control, adjustment of the mA and/or kV according to patient size and/or use of iterative reconstruction technique. COMPARISON:  07/04/2023. FINDINGS: Lower chest: Heart is normal in size and there is a trace pericardial effusion. Scattered coronary artery calcifications are present. Atelectasis is present at the lung bases. There is a large hiatal hernia. Hepatobiliary: No focal liver abnormality is seen. No gallstones, gallbladder wall thickening, or biliary dilatation. Pancreas: Unremarkable. No pancreatic ductal dilatation or surrounding inflammatory changes. Spleen: Normal in size without focal abnormality. Adrenals/Urinary Tract: The adrenal glands are within normal limits. A cyst is noted in the left kidney. No renal calculus or hydronephrosis bilaterally. Bladder is within normal limits.  Stomach/Bowel: There is a large hiatal hernia in the stomach is completely intrathoracic in location. No bowel obstruction, free air, or pneumatosis is seen. Appendix is surgically absent. A few scattered diverticula are present along the colon without evidence of diverticulitis. Vascular/Lymphatic: Aortic atherosclerosis. No enlarged abdominal or pelvic lymph nodes. Reproductive: Status post hysterectomy. No adnexal masses. Other: No abdominopelvic ascites. Surgical changes are noted in the anterior abdominal wall. Musculoskeletal: Degenerative changes are present in the lumbar spine. Radiopaque densities are present in the inferior aspect of the site of the sacrum on the left, suggesting shrapnel. CT lumbar spine findings: Alignment: Dextroscoliosis is noted. Alignment is otherwise within normal limits. Vertebral bodies: No acute fracture is seen. Disc spaces: There stable moderate multilevel degenerative disc disease with intervertebral disc space narrowing and degenerative endplate changes. Paraspinous soft tissues  no acute abnormality is seen. IMPRESSION: 1. No acute intra-abdominal process. 2. Multilevel degenerative changes in the lumbar spine without evidence of acute fracture. 3. Large hiatal hernia. 4. Coronary artery calcifications. 5. Aortic atherosclerosis. Electronically Signed   By: Leita Birmingham M.D.   On: 02/11/2024 16:22   CT Cervical Spine Wo Contrast Result Date: 02/11/2024 CLINICAL DATA:  Worsening lower mid abdominal pain for 2 weeks. EXAM: CT CERVICAL SPINE WITHOUT CONTRAST TECHNIQUE: Multidetector CT imaging of the cervical spine was performed without intravenous contrast. Multiplanar CT image reconstructions were also generated. RADIATION DOSE REDUCTION: This exam was performed according to the departmental dose-optimization program which includes automated exposure control, adjustment of the mA and/or kV according to patient size and/or use of iterative reconstruction technique.  COMPARISON:  December 02, 2023 FINDINGS: Alignment: Normal. Skull base and vertebrae: No acute fracture. Chronic and degenerative changes seen involving the body and tip of the dens, as well as the adjacent portion of the anterior arch of C1. Soft tissues and spinal canal: No prevertebral fluid or swelling. No visible canal hematoma. Disc levels: There is marked severity endplate sclerosis, anterior osteophyte formation and posterior bony spurring at the level of C2-C3 and C4-C5. There is marked severity narrowing of the anterior atlantoaxial articulation. Metallic density fusion plates and screws are seen along the anterior aspects of the levels of C3-C4, C5-C6 and C6-C7 with postoperative changes noted within the intervertebral disc spaces at these levels. Marked severity intervertebral disc space narrowing is present at the level of C2-C3. Bilateral moderate severity multilevel facet joint hypertrophy is noted. Upper chest: There is moderate severity emphysematous lung disease. Other: None. IMPRESSION: 1. No acute fracture or subluxation of the cervical spine. 2. Anterior cervical fusion at the levels of C3-C4, C5-C6 and C6-C7. 3. Marked severity degenerative changes at the level of C2-C3 and C4-C5. 4. Moderate severity emphysematous lung disease. Electronically Signed   By: Suzen Dials M.D.   On: 02/11/2024 16:17   CT Head Wo Contrast Result Date: 02/11/2024 CLINICAL DATA:  Worsening lower mid abdominal pain for 2 weeks. EXAM: CT HEAD WITHOUT CONTRAST TECHNIQUE: Contiguous axial images were obtained from the base of the skull through the vertex without intravenous contrast. RADIATION DOSE REDUCTION: This exam was performed according to the departmental dose-optimization program which includes automated exposure control, adjustment of the mA and/or kV according to patient size and/or use of iterative reconstruction technique. COMPARISON:  December 02, 2023 FINDINGS: Brain: There is generalized cerebral  atrophy with widening of the extra-axial spaces and ventricular dilatation. There are areas of decreased attenuation within the white matter tracts of the supratentorial brain, consistent with microvascular disease changes. Vascular: Marked severity bilateral cavernous carotid artery calcification is noted. Skull: Normal. Negative for fracture or focal lesion. Sinuses/Orbits: No acute finding. Other: None. IMPRESSION: 1. Generalized cerebral atrophy and microvascular disease changes of the supratentorial brain. 2. No acute intracranial abnormality. Electronically Signed   By: Suzen Dials M.D.   On: 02/11/2024 16:09   DG Chest 2 View Result Date: 02/11/2024 CLINICAL DATA:  Status post fall with subsequent left sided chest wall tenderness to palpation. EXAM: CHEST - 2 VIEW COMPARISON:  November 07, 2023 FINDINGS: The cardiac silhouette is borderline in size and stable in appearance. Mild linear scarring and/or atelectasis is seen within the bilateral lung bases. No pleural effusion or pneumothorax is identified. There is a large hiatal hernia. Multiple chronic left-sided rib fractures are seen. Postoperative changes are seen within the cervical spine. No acute  osseous abnormalities are clearly identified. IMPRESSION: 1. Mild bibasilar linear scarring and/or atelectasis. 2. Large hiatal hernia. 3. Multiple chronic left-sided rib fractures without evidence of acute osseous abnormality. Further evaluation with a dedicated left-sided rib series is recommended if an acute rib fracture remains of clinical concern. Electronically Signed   By: Suzen Dials M.D.   On: 02/11/2024 16:02   Procedures   Medications Ordered in the ED  LORazepam  (ATIVAN ) injection 1 mg (has no administration in time range)  diphenhydrAMINE  (BENADRYL ) injection 25 mg (25 mg Intravenous Given 02/11/24 1716)  cefTRIAXone  (ROCEPHIN ) 2 g in sodium chloride  0.9 % 100 mL IVPB (2 g Intravenous New Bag/Given 02/11/24 1845)     Medical  Decision Making Amount and/or Complexity of Data Reviewed Labs: ordered.  This patient is a 69 year old female who presents to the ED for concern of multiple complaints including chest pain, shortness of breath, abdominal suprapubic pain, low back pain, urinary incontinence present ongoing intermittently x 2 weeks.  Noted to had similar symptoms intermittent for many months.  Worsening recently.  No exertional symptoms, patient is not ambulatory at baseline, noted to have had a fall 2 weeks ago for which she then has also had chronic low back pain and urinary incontinence.  On physical exam, patient is in no acute distress, afebrile, alert and orient x 4, speaking in full sentences, nontachypneic, nontachycardic.  LCTAB.  No lower leg edema.  Noted to have some suprapubic tenderness to palpation with no CVA tenderness.  Reproducible chest wall pain noted to left side of chest.  Unremarkable exam  Patient overall well-appearing.  Reporting chest pain shortness of breath but does not appear short of breath at this time.  However with patient's urinary incontinence that is worsening and fall that preceded with low back pain, MRI was done to rule out cauda equina which was negative.  Lab work was done which did note a UTI, for which she was given 1 g of Rocephin .  With lab work otherwise unremarkable and reassuring.  Imaging was also reassuring without any signs of acute abnormalities.  Chronic rib fractures are likely explanation for patient's reproducible chest wall pain.  Will have her to follow-up orthopedic surgery and PCP.  Providing antibiotics for UTI.  Urine culture pending.  Patient vital signs have remained stable throughout the course of patient's time in the ED. Low suspicion for any other emergent pathology at this time. I believe this patient is safe to be discharged. Provided strict return to ER precautions. Patient expressed agreement and understanding of plan. All questions were  answered.  Differential diagnoses prior to evaluation: The emergent differential diagnosis includes, but is not limited to, diverticulitis, nephrolithiasis, pyelonephritis, SBO, LBO, gastroenteritis, PUD, cauda equina, fracture, ligamentous injury, anxiety, ACS, PE, AAS UTI. This is not an exhaustive differential.   Past Medical History / Co-morbidities / Social History: MRSA, GERD, cocaine abuse, CKD, COPD, HTN, kidney stones, rheumatoid arthritis  Additional history: Chart reviewed. Pertinent results include: Last seen on 11//2025 for fall and discharged with negative MRI.  Lab Tests/Imaging studies: I personally interpreted labs/imaging and the pertinent results include:   CBC notes stable hemoglobin CMP notes stable creatinine and GFR UA does not likely UTI in the presence of suprapubic pain with large leukocytes and many bacteria, urine culture pending Troponin 11 UDS negative for cocaine and unremarkable   X-ray of chest shows multiple chronic left-sided rib fractures without any acute abnormalities.  Does note large hiatal hernia.  CT abdomen pelvis  does not show any acute abnormalities CT L-spine does not show any acute abnormalities CT cervical spine does not show any acute abnormalities MRI lumbar spine does not show any acute signs of cauda equina.   I agree with the radiologist interpretation.  Cardiac monitoring: EKG obtained and interpreted by myself and attending physician which shows: EKG shows sinus rhythm with borderline T wave abnormalities     Medications: I ordered medication including Rocephin , Keflex .  I have reviewed the patients home medicines and have made adjustments as needed.  Critical Interventions: None  Social Determinants of Health: Has good follow-up with PCP  Disposition: After consideration of the diagnostic results and the patients response to treatment, I feel that the patient would benefit from discharge and shortness of breath.    emergency department workup does not suggest an emergent condition requiring admission or immediate intervention beyond what has been performed at this time. The plan is: Follow-up with PCP and orthopedic surgery, Keflex  for UTI and urine culture pending, return to the ED for new or worsening symptoms. The patient is safe for discharge and has been instructed to return immediately for worsening symptoms, change in symptoms or any other concerns.  Final diagnoses:  Acute cystitis without hematuria  Acute on chronic low back pain  Urinary incontinence, unspecified type  Suprapubic pain  Chronic fracture  Fall, initial encounter    ED Discharge Orders          Ordered    cephALEXin  (KEFLEX ) 500 MG capsule  2 times daily        02/11/24 1941               Beola Terrall GORMAN DEVONNA 02/11/24 1948    Dasie Faden, MD 02/11/24 2250  "

## 2024-02-13 ENCOUNTER — Ambulatory Visit: Admitting: Physician Assistant

## 2024-02-14 LAB — URINE CULTURE: Culture: >=100000 — AB

## 2024-02-15 ENCOUNTER — Telehealth (HOSPITAL_BASED_OUTPATIENT_CLINIC_OR_DEPARTMENT_OTHER): Payer: Self-pay

## 2024-02-15 NOTE — Telephone Encounter (Signed)
 Post ED Visit - Positive Culture Follow-up  Culture report reviewed by antimicrobial stewardship pharmacist: [facility] [address], Pharm.D. []  Venetia Gully, Pharm.D., BCPS AQ-ID []  Garrel Crews, Pharm.D., BCPS []  Almarie Lunger, Pharm.D., BCPS []  [place], [address].D., BCPS, AAHIVP []  Rosaline Bihari, Pharm.D., BCPS, AAHIVP []  Vernell Meier, PharmD, BCPS []  Latanya Hint, PharmD, BCPS []  Donald Medley, PharmD, BCPS []  Rocky Bold, PharmD []  Dorothyann Alert, PharmD, BCPS []  Morene Babe, PharmD  [facility] [x]  Damien Quiet, PharmD []  Romona Bliss, PharmD []  Dolphus Roller, PharmD []  Veva Seip, Rph []  Vernell Daunt) Leonce, PharmD []  Eva Allis, PharmD []  Rosaline Millet, PharmD []  Iantha Batch, PharmD []  Arvin Gauss, PharmD []  Wanda Hasting, PharmD []  Ronal Rav, PharmD []  Rocky Slade, PharmD []  Bard Jeans, PharmD   Positive urine culture Treated with Cephalexin , organism sensitive to the same and no further patient follow-up is required at this time.  Ruth Camelia Elbe 02/15/2024, 12:12 PM

## 2024-02-27 ENCOUNTER — Ambulatory Visit: Admitting: Physician Assistant
